# Patient Record
Sex: Female | Born: 1945 | Race: Black or African American | Hispanic: No | Smoking: Former smoker
Health system: Southern US, Community
[De-identification: ages and names within clinical notes are randomized; demographics above are authoritative.]

## PROBLEM LIST (undated history)

## (undated) DIAGNOSIS — R609 Edema, unspecified: Secondary | ICD-10-CM

## (undated) DIAGNOSIS — J45909 Unspecified asthma, uncomplicated: Secondary | ICD-10-CM

## (undated) DIAGNOSIS — I509 Heart failure, unspecified: Secondary | ICD-10-CM

## (undated) DIAGNOSIS — M1990 Unspecified osteoarthritis, unspecified site: Secondary | ICD-10-CM

## (undated) DIAGNOSIS — I272 Pulmonary hypertension, unspecified: Secondary | ICD-10-CM

## (undated) DIAGNOSIS — I959 Hypotension, unspecified: Secondary | ICD-10-CM

## (undated) DIAGNOSIS — Z9289 Personal history of other medical treatment: Secondary | ICD-10-CM

## (undated) DIAGNOSIS — N189 Chronic kidney disease, unspecified: Secondary | ICD-10-CM

## (undated) DIAGNOSIS — G4730 Sleep apnea, unspecified: Secondary | ICD-10-CM

## (undated) DIAGNOSIS — I10 Essential (primary) hypertension: Secondary | ICD-10-CM

## (undated) DIAGNOSIS — I429 Cardiomyopathy, unspecified: Secondary | ICD-10-CM

## (undated) DIAGNOSIS — E785 Hyperlipidemia, unspecified: Secondary | ICD-10-CM

## (undated) DIAGNOSIS — T7840XA Allergy, unspecified, initial encounter: Secondary | ICD-10-CM

## (undated) DIAGNOSIS — I071 Rheumatic tricuspid insufficiency: Secondary | ICD-10-CM

## (undated) DIAGNOSIS — I4891 Unspecified atrial fibrillation: Secondary | ICD-10-CM

## (undated) DIAGNOSIS — J90 Pleural effusion, not elsewhere classified: Secondary | ICD-10-CM

## (undated) HISTORY — PX: COLONOSCOPY W/ BIOPSIES AND POLYPECTOMY: SHX1376

## (undated) HISTORY — DX: Unspecified asthma, uncomplicated: J45.909

## (undated) HISTORY — DX: Hyperlipidemia, unspecified: E78.5

## (undated) HISTORY — PX: ABDOMINAL HYSTERECTOMY: SHX81

## (undated) HISTORY — PX: THORACENTESIS: SHX235

## (undated) HISTORY — PX: MULTIPLE TOOTH EXTRACTIONS: SHX2053

## (undated) HISTORY — PX: CARDIAC CATHETERIZATION: SHX172

---

## 2019-06-09 ENCOUNTER — Encounter (HOSPITAL_COMMUNITY): Payer: Self-pay | Admitting: Emergency Medicine

## 2019-06-09 ENCOUNTER — Ambulatory Visit (HOSPITAL_COMMUNITY)
Admission: EM | Admit: 2019-06-09 | Discharge: 2019-06-09 | Disposition: A | Discharge status: Home / Self Care | Payer: Medicare Other | Attending: Urgent Care | Admitting: Urgent Care

## 2019-06-09 DIAGNOSIS — I482 Chronic atrial fibrillation, unspecified: Secondary | ICD-10-CM | POA: Insufficient documentation

## 2019-06-09 DIAGNOSIS — I429 Cardiomyopathy, unspecified: Secondary | ICD-10-CM | POA: Diagnosis present

## 2019-06-09 DIAGNOSIS — I110 Hypertensive heart disease with heart failure: Secondary | ICD-10-CM

## 2019-06-09 DIAGNOSIS — I959 Hypotension, unspecified: Secondary | ICD-10-CM | POA: Diagnosis not present

## 2019-06-09 DIAGNOSIS — I480 Paroxysmal atrial fibrillation: Secondary | ICD-10-CM

## 2019-06-09 DIAGNOSIS — I10 Essential (primary) hypertension: Secondary | ICD-10-CM | POA: Diagnosis present

## 2019-06-09 DIAGNOSIS — R5383 Other fatigue: Secondary | ICD-10-CM | POA: Insufficient documentation

## 2019-06-09 DIAGNOSIS — I509 Heart failure, unspecified: Secondary | ICD-10-CM | POA: Insufficient documentation

## 2019-06-09 DIAGNOSIS — R001 Bradycardia, unspecified: Secondary | ICD-10-CM

## 2019-06-09 HISTORY — DX: Edema, unspecified: R60.9

## 2019-06-09 HISTORY — DX: Cardiomyopathy, unspecified: I42.9

## 2019-06-09 HISTORY — DX: Pulmonary hypertension, unspecified: I27.20

## 2019-06-09 HISTORY — DX: Sleep apnea, unspecified: G47.30

## 2019-06-09 HISTORY — DX: Rheumatic tricuspid insufficiency: I07.1

## 2019-06-09 HISTORY — DX: Unspecified atrial fibrillation: I48.91

## 2019-06-09 HISTORY — DX: Heart failure, unspecified: I50.9

## 2019-06-09 HISTORY — DX: Essential (primary) hypertension: I10

## 2019-06-09 LAB — POCT URINALYSIS DIP (DEVICE)
Bilirubin Urine: NEGATIVE
Glucose, UA: NEGATIVE mg/dL
Ketones, ur: NEGATIVE mg/dL
Nitrite: NEGATIVE
Protein, ur: NEGATIVE mg/dL
Specific Gravity, Urine: 1.015 (ref 1.005–1.030)
Urobilinogen, UA: 0.2 mg/dL (ref 0.0–1.0)
pH: 6 (ref 5.0–8.0)

## 2019-06-09 NOTE — ED Provider Notes (Signed)
MRN: [id number] DOB: 28-Jul-1946  Subjective:   Melissa Adams is a 73 y.o. female presenting for hypotension, fatigue. Patient was recently seen by her cardiologist, Dr. Thomasene Mohair. Was started with Eliquis on 06/01/2019, was previously on coumadin. She was also decreased from 4mg  to 2mg  of trandalopril. Has taken metoprolol, diltiazem long term without any changes at 100mg  QD and 120mg  QD.  Denies confusion, changes in mental status, severe headache, chest pain, abdominal pain, diaphoresis.  No current facility-administered medications for this encounter.   Current Outpatient Medications:  .  allopurinol (ZYLOPRIM) 300 MG tablet, Take 300 mg by mouth daily., Disp: , Rfl:  .  apixaban (ELIQUIS) 5 MG TABS tablet, Take 5 mg by mouth 2 (two) times daily., Disp: , Rfl:  .  atorvastatin (LIPITOR) 40 MG tablet, Take 40 mg by mouth daily., Disp: , Rfl:  .  colchicine 0.6 MG tablet, Take 0.6 mg by mouth daily., Disp: , Rfl:  .  cyclobenzaprine (FLEXERIL) 10 MG tablet, Take 10 mg by mouth 3 (three) times daily as needed for muscle spasms., Disp: , Rfl:  .  diltiazem (CARDIZEM) 120 MG tablet, Take 120 mg by mouth 4 (four) times daily., Disp: , Rfl:  .  furosemide (LASIX) 80 MG tablet, Take 80 mg by mouth., Disp: , Rfl:  .  gabapentin (NEURONTIN) 300 MG capsule, Take 300 mg by mouth 3 (three) times daily., Disp: , Rfl:  .  metoprolol succinate (TOPROL-XL) 100 MG 24 hr tablet, Take 100 mg by mouth daily. Take with or immediately following a meal., Disp: , Rfl:  .  traMADol (ULTRAM) 50 MG tablet, Take by mouth every 6 (six) hours as needed., Disp: , Rfl:  .  trandolapril (MAVIK) 2 MG tablet, Take 2 mg by mouth daily., Disp: , Rfl:  .  triamcinolone cream (KENALOG) 0.1 %, Apply 1 application topically 2 (two) times daily., Disp: , Rfl:     No Known Allergies   Past Medical History:  Diagnosis Date  . A-fib ([place])   . Cardiomyopathy ([place])   . CHF (congestive heart failure) ([place])   . Edema   .  Hypertension   . Pulmonary hypertension ([place])   . Sleep apnea   . Tricuspid regurgitation      History reviewed. No pertinent surgical history.  ROS  Objective:   Vitals: BP (!) 105/44   Pulse (!) 50   Temp 98.5 F (36.9 C)   Resp 18   SpO2 99%   BP Readings from Last 3 Encounters:  06/09/19 (!) 105/44   Physical Exam Constitutional:      General: She is not in acute distress.    Appearance: Normal appearance. She is well-developed. She is not ill-appearing, toxic-appearing or diaphoretic.  HENT:     Head: Normocephalic and atraumatic.     Nose: Nose normal.     Mouth/Throat:     Mouth: Mucous membranes are moist.     Pharynx: Oropharynx is clear.  Eyes:     General: No scleral icterus.    Extraocular Movements: Extraocular movements intact.     Pupils: Pupils are equal, round, and reactive to light.  Cardiovascular:     Rate and Rhythm: Bradycardia present. Rhythm irregular.     Pulses: Normal pulses.     Heart sounds: Normal heart sounds. No murmur. No friction rub. No gallop.   Pulmonary:     Effort: Pulmonary effort is normal. No respiratory distress.     Breath sounds: Normal breath sounds. No stridor.  No wheezing, rhonchi or rales.  Skin:    General: Skin is warm and dry.     Findings: No rash.  Neurological:     General: No focal deficit present.     Mental Status: She is alert and oriented to person, place, and time.     Cranial Nerves: No cranial nerve deficit.     Motor: No weakness.     Coordination: Coordination normal.  Psychiatric:        Mood and Affect: Mood normal.        Behavior: Behavior normal.        Thought Content: Thought content normal.    Results for orders placed or performed during the hospital encounter of 06/09/19 (from the past 24 hour(s))  POCT urinalysis dip (device)     Status: Abnormal   Collection Time: 06/09/19  5:35 PM  Result Value Ref Range   Glucose, UA NEGATIVE NEGATIVE mg/dL   Bilirubin Urine NEGATIVE NEGATIVE    Ketones, ur NEGATIVE NEGATIVE mg/dL   Specific Gravity, Urine 1.015 1.005 - 1.030   Hgb urine dipstick MODERATE (A) NEGATIVE   pH 6.0 5.0 - 8.0   Protein, ur NEGATIVE NEGATIVE mg/dL   Urobilinogen, UA 0.2 0.0 - 1.0 mg/dL   Nitrite NEGATIVE NEGATIVE   Leukocytes,Ua SMALL (A) NEGATIVE    Assessment and Plan :   1. Hypotension, unspecified hypotension type   2. Essential hypertension   3. Chronic atrial fibrillation   4. Other fatigue   5. Cardiomyopathy, unspecified type ([place])   6. Chronic congestive heart failure, unspecified heart failure type [facility])     Case precepted with Dr. Mannie Stabile.  We decided to have patient hold her metoprolol given her bradycardia which I confirmed through manual check and was counted at 48 bpm.  She has chronic atrial fibrillation noted on exam as well.  I also counseled that she is supposed to be on Lasix 80 mg once a day.  Patient is to follow-up as soon as possible with her cardiologist or return to our clinic if she is unable to see them within the next 2 to 3 days. Counseled patient on potential for adverse effects with medications prescribed/recommended today, ER and return-to-clinic precautions discussed, patient verbalized understanding.    Jaynee Eagles, [place] 06/09/19 1958

## 2019-06-09 NOTE — ED Triage Notes (Signed)
Per family, pt has significant medical history. Pt has a cardiologist etc, pt has had her medicines recently adjusted and was told to monitor BP for the next two weeks. The last few days her BP readings are 92/79, 97/76. Pt also c/o feeling tired.

## 2019-06-09 NOTE — Discharge Instructions (Addendum)
Stop taking Lasix twice daily. Your cardiologist instructions specifies to take Lasix 80mg  just once daily. Hold off on taking metoprolol to see if this helps with the heart rate. If you cannot see your cardiologist in 2-3 days, then come back here for a recheck on your low blood pressure, low heart rate and fatigue.

## 2019-06-10 LAB — URINE CULTURE: Culture: 50000 — AB

## 2019-10-11 ENCOUNTER — Emergency Department (HOSPITAL_COMMUNITY): Payer: Medicare Other

## 2019-10-11 ENCOUNTER — Inpatient Hospital Stay (HOSPITAL_COMMUNITY)
Admission: EM | Admit: 2019-10-11 | Discharge: 2019-11-05 | DRG: 264 | Disposition: A | Discharge status: Home Health | Payer: Medicare Other | Attending: Internal Medicine | Admitting: Internal Medicine

## 2019-10-11 ENCOUNTER — Encounter (HOSPITAL_COMMUNITY): Payer: Self-pay | Admitting: Internal Medicine

## 2019-10-11 ENCOUNTER — Other Ambulatory Visit: Payer: Self-pay

## 2019-10-11 DIAGNOSIS — I081 Rheumatic disorders of both mitral and tricuspid valves: Secondary | ICD-10-CM | POA: Diagnosis present

## 2019-10-11 DIAGNOSIS — E1122 Type 2 diabetes mellitus with diabetic chronic kidney disease: Secondary | ICD-10-CM | POA: Diagnosis present

## 2019-10-11 DIAGNOSIS — R0600 Dyspnea, unspecified: Secondary | ICD-10-CM

## 2019-10-11 DIAGNOSIS — J159 Unspecified bacterial pneumonia: Secondary | ICD-10-CM | POA: Diagnosis not present

## 2019-10-11 DIAGNOSIS — J9601 Acute respiratory failure with hypoxia: Secondary | ICD-10-CM | POA: Diagnosis not present

## 2019-10-11 DIAGNOSIS — D509 Iron deficiency anemia, unspecified: Secondary | ICD-10-CM | POA: Diagnosis present

## 2019-10-11 DIAGNOSIS — I5082 Biventricular heart failure: Secondary | ICD-10-CM | POA: Diagnosis present

## 2019-10-11 DIAGNOSIS — R570 Cardiogenic shock: Secondary | ICD-10-CM | POA: Diagnosis not present

## 2019-10-11 DIAGNOSIS — Z8249 Family history of ischemic heart disease and other diseases of the circulatory system: Secondary | ICD-10-CM

## 2019-10-11 DIAGNOSIS — I5023 Acute on chronic systolic (congestive) heart failure: Secondary | ICD-10-CM

## 2019-10-11 DIAGNOSIS — I272 Pulmonary hypertension, unspecified: Secondary | ICD-10-CM | POA: Diagnosis present

## 2019-10-11 DIAGNOSIS — I5043 Acute on chronic combined systolic (congestive) and diastolic (congestive) heart failure: Secondary | ICD-10-CM | POA: Diagnosis present

## 2019-10-11 DIAGNOSIS — Z79891 Long term (current) use of opiate analgesic: Secondary | ICD-10-CM

## 2019-10-11 DIAGNOSIS — G4733 Obstructive sleep apnea (adult) (pediatric): Secondary | ICD-10-CM | POA: Diagnosis present

## 2019-10-11 DIAGNOSIS — I482 Chronic atrial fibrillation, unspecified: Secondary | ICD-10-CM

## 2019-10-11 DIAGNOSIS — Z79899 Other long term (current) drug therapy: Secondary | ICD-10-CM

## 2019-10-11 DIAGNOSIS — L89892 Pressure ulcer of other site, stage 2: Secondary | ICD-10-CM | POA: Diagnosis not present

## 2019-10-11 DIAGNOSIS — K5900 Constipation, unspecified: Secondary | ICD-10-CM | POA: Diagnosis not present

## 2019-10-11 DIAGNOSIS — Z833 Family history of diabetes mellitus: Secondary | ICD-10-CM

## 2019-10-11 DIAGNOSIS — Z90710 Acquired absence of both cervix and uterus: Secondary | ICD-10-CM

## 2019-10-11 DIAGNOSIS — I472 Ventricular tachycardia: Secondary | ICD-10-CM | POA: Diagnosis not present

## 2019-10-11 DIAGNOSIS — N179 Acute kidney failure, unspecified: Secondary | ICD-10-CM | POA: Diagnosis not present

## 2019-10-11 DIAGNOSIS — I70208 Unspecified atherosclerosis of native arteries of extremities, other extremity: Secondary | ICD-10-CM | POA: Diagnosis present

## 2019-10-11 DIAGNOSIS — I10 Essential (primary) hypertension: Secondary | ICD-10-CM

## 2019-10-11 DIAGNOSIS — I5041 Acute combined systolic (congestive) and diastolic (congestive) heart failure: Secondary | ICD-10-CM | POA: Diagnosis not present

## 2019-10-11 DIAGNOSIS — D631 Anemia in chronic kidney disease: Secondary | ICD-10-CM | POA: Diagnosis present

## 2019-10-11 DIAGNOSIS — E871 Hypo-osmolality and hyponatremia: Secondary | ICD-10-CM | POA: Diagnosis not present

## 2019-10-11 DIAGNOSIS — Z992 Dependence on renal dialysis: Secondary | ICD-10-CM

## 2019-10-11 DIAGNOSIS — R0602 Shortness of breath: Secondary | ICD-10-CM | POA: Diagnosis not present

## 2019-10-11 DIAGNOSIS — D649 Anemia, unspecified: Secondary | ICD-10-CM

## 2019-10-11 DIAGNOSIS — R52 Pain, unspecified: Secondary | ICD-10-CM

## 2019-10-11 DIAGNOSIS — I4819 Other persistent atrial fibrillation: Secondary | ICD-10-CM | POA: Diagnosis present

## 2019-10-11 DIAGNOSIS — Z6841 Body Mass Index (BMI) 40.0 and over, adult: Secondary | ICD-10-CM

## 2019-10-11 DIAGNOSIS — M109 Gout, unspecified: Secondary | ICD-10-CM | POA: Diagnosis present

## 2019-10-11 DIAGNOSIS — J90 Pleural effusion, not elsewhere classified: Secondary | ICD-10-CM

## 2019-10-11 DIAGNOSIS — R042 Hemoptysis: Secondary | ICD-10-CM

## 2019-10-11 DIAGNOSIS — Z820 Family history of epilepsy and other diseases of the nervous system: Secondary | ICD-10-CM

## 2019-10-11 DIAGNOSIS — Z8261 Family history of arthritis: Secondary | ICD-10-CM

## 2019-10-11 DIAGNOSIS — I132 Hypertensive heart and chronic kidney disease with heart failure and with stage 5 chronic kidney disease, or end stage renal disease: Secondary | ICD-10-CM | POA: Diagnosis not present

## 2019-10-11 DIAGNOSIS — I509 Heart failure, unspecified: Secondary | ICD-10-CM

## 2019-10-11 DIAGNOSIS — I5021 Acute systolic (congestive) heart failure: Secondary | ICD-10-CM | POA: Diagnosis not present

## 2019-10-11 DIAGNOSIS — J1289 Other viral pneumonia: Secondary | ICD-10-CM | POA: Diagnosis not present

## 2019-10-11 DIAGNOSIS — Z95828 Presence of other vascular implants and grafts: Secondary | ICD-10-CM

## 2019-10-11 DIAGNOSIS — Z7901 Long term (current) use of anticoagulants: Secondary | ICD-10-CM

## 2019-10-11 DIAGNOSIS — L8990 Pressure ulcer of unspecified site, unspecified stage: Secondary | ICD-10-CM | POA: Insufficient documentation

## 2019-10-11 DIAGNOSIS — E785 Hyperlipidemia, unspecified: Secondary | ICD-10-CM | POA: Diagnosis present

## 2019-10-11 DIAGNOSIS — Z9289 Personal history of other medical treatment: Secondary | ICD-10-CM

## 2019-10-11 DIAGNOSIS — N186 End stage renal disease: Secondary | ICD-10-CM | POA: Diagnosis not present

## 2019-10-11 DIAGNOSIS — Z452 Encounter for adjustment and management of vascular access device: Secondary | ICD-10-CM

## 2019-10-11 DIAGNOSIS — G4730 Sleep apnea, unspecified: Secondary | ICD-10-CM

## 2019-10-11 DIAGNOSIS — R34 Anuria and oliguria: Secondary | ICD-10-CM | POA: Diagnosis not present

## 2019-10-11 DIAGNOSIS — I428 Other cardiomyopathies: Secondary | ICD-10-CM | POA: Diagnosis present

## 2019-10-11 DIAGNOSIS — R509 Fever, unspecified: Secondary | ICD-10-CM

## 2019-10-11 DIAGNOSIS — D6489 Other specified anemias: Secondary | ICD-10-CM | POA: Diagnosis not present

## 2019-10-11 DIAGNOSIS — U071 COVID-19: Secondary | ICD-10-CM | POA: Diagnosis present

## 2019-10-11 LAB — BASIC METABOLIC PANEL
Anion gap: 16 — ABNORMAL HIGH (ref 5–15)
BUN: 47 mg/dL — ABNORMAL HIGH (ref 8–23)
CO2: 24 mmol/L (ref 22–32)
Calcium: 9.5 mg/dL (ref 8.9–10.3)
Chloride: 97 mmol/L — ABNORMAL LOW (ref 98–111)
Creatinine, Ser: 1.91 mg/dL — ABNORMAL HIGH (ref 0.44–1.00)
GFR calc Af Amer: 30 mL/min — ABNORMAL LOW (ref >60)
GFR calc non Af Amer: 26 mL/min — ABNORMAL LOW (ref >60)
Glucose, Bld: 97 mg/dL (ref 70–99)
Potassium: 4.1 mmol/L (ref 3.5–5.1)
Sodium: 137 mmol/L (ref 135–145)

## 2019-10-11 LAB — CBC
HCT: 33.4 % — ABNORMAL LOW (ref 36.0–46.0)
Hemoglobin: 9.7 g/dL — ABNORMAL LOW (ref 12.0–15.0)
MCH: 23.7 pg — ABNORMAL LOW (ref 26.0–34.0)
MCHC: 29 g/dL — ABNORMAL LOW (ref 30.0–36.0)
MCV: 81.5 fL (ref 80.0–100.0)
Platelets: 216 10*3/uL (ref 150–400)
RBC: 4.1 MIL/uL (ref 3.87–5.11)
RDW: 17.2 % — ABNORMAL HIGH (ref 11.5–15.5)
WBC: 6.2 10*3/uL (ref 4.0–10.5)
nRBC: 0.3 % — ABNORMAL HIGH (ref 0.0–0.2)

## 2019-10-11 LAB — BRAIN NATRIURETIC PEPTIDE: B Natriuretic Peptide: 3374.7 pg/mL — ABNORMAL HIGH (ref 0.0–100.0)

## 2019-10-11 LAB — MRSA PCR SCREENING: MRSA by PCR: NEGATIVE

## 2019-10-11 LAB — TROPONIN I (HIGH SENSITIVITY)
Troponin I (High Sensitivity): 30 ng/L — ABNORMAL HIGH (ref <18)
Troponin I (High Sensitivity): 33 ng/L — ABNORMAL HIGH (ref <18)

## 2019-10-11 MED ORDER — SODIUM CHLORIDE 0.9% FLUSH
3.0000 mL | INTRAVENOUS | Status: DC | PRN
  Administered 2019-10-12: 3 mL via INTRAVENOUS
  Filled 2019-10-11: qty 3

## 2019-10-11 MED ORDER — FUROSEMIDE 10 MG/ML IJ SOLN
80.0000 mg | Freq: Once | INTRAMUSCULAR | Status: AC
  Administered 2019-10-11: 80 mg via INTRAVENOUS
  Filled 2019-10-11: qty 8

## 2019-10-11 MED ORDER — ACETAMINOPHEN 325 MG PO TABS
650.0000 mg | ORAL_TABLET | ORAL | Status: DC | PRN
  Administered 2019-10-12 – 2019-10-25 (×3): 650 mg via ORAL
  Filled 2019-10-11 (×4): qty 2

## 2019-10-11 MED ORDER — ALLOPURINOL 300 MG PO TABS
300.0000 mg | ORAL_TABLET | Freq: Every day | ORAL | Status: DC
  Administered 2019-10-11 – 2019-10-26 (×16): 300 mg via ORAL
  Filled 2019-10-11 (×16): qty 1

## 2019-10-11 MED ORDER — SODIUM CHLORIDE 0.9% FLUSH
3.0000 mL | Freq: Two times a day (BID) | INTRAVENOUS | Status: DC
  Administered 2019-10-11 – 2019-10-18 (×7): 3 mL via INTRAVENOUS

## 2019-10-11 MED ORDER — ATORVASTATIN CALCIUM 40 MG PO TABS
40.0000 mg | ORAL_TABLET | Freq: Every day | ORAL | Status: DC
  Administered 2019-10-11 – 2019-11-05 (×25): 40 mg via ORAL
  Filled 2019-10-11 (×26): qty 1

## 2019-10-11 MED ORDER — TRAMADOL HCL 50 MG PO TABS
50.0000 mg | ORAL_TABLET | Freq: Four times a day (QID) | ORAL | Status: DC | PRN
  Administered 2019-10-18 – 2019-11-04 (×3): 50 mg via ORAL
  Filled 2019-10-11 (×4): qty 1

## 2019-10-11 MED ORDER — HEPARIN SODIUM (PORCINE) 5000 UNIT/ML IJ SOLN
5000.0000 [IU] | Freq: Three times a day (TID) | INTRAMUSCULAR | Status: DC
  Administered 2019-10-11 – 2019-10-12 (×2): 5000 [IU] via SUBCUTANEOUS
  Filled 2019-10-11 (×2): qty 1

## 2019-10-11 MED ORDER — CYCLOBENZAPRINE HCL 10 MG PO TABS: 10.0000 mg | ORAL_TABLET | Freq: Three times a day (TID) | ORAL | Status: DC | PRN

## 2019-10-11 MED ORDER — SODIUM CHLORIDE 0.9 % IV SOLN: 250.0000 mL | INTRAVENOUS | Status: DC | PRN

## 2019-10-11 MED ORDER — ONDANSETRON HCL 4 MG/2ML IJ SOLN
4.0000 mg | Freq: Four times a day (QID) | INTRAMUSCULAR | Status: DC | PRN
  Administered 2019-10-13: 4 mg via INTRAVENOUS
  Filled 2019-10-11: qty 2

## 2019-10-11 NOTE — Consult Note (Signed)
Cardiology Consultation:   Patient ID: Melissa Adams MRN: [id number]; DOB: 31-May-1946  Admit date: 10/11/2019 Date of Consult: 10/11/2019  Primary Care Provider: System, Provider Not In Primary Cardiologist: [place] , [place]   , Melissa Adams at [facility] Primary Electrophysiologist:  None    Patient Profile:   Melissa Adams is a 73 y.o. female with a hx of atrial fibrillation, cardiomyopathy, congestive heart failure, hypertension who is being seen today for the evaluation of congestive heart failure exacerbation at the request of  Dr. Ree Kida .  History of Present Illness:   Ms. Melissa Adams is a 73 year old female with a history of atrial fibrillation, chronic congestive heart failure, hypertension who was down in [place] visiting her daughter.  For the past several weeks the patient has had weight gain of approximately 22 pounds.  She has had some worsening shortness of breath with exertion.  She is had markedly increasing dyspnea with exertion.  She has had some fluttering sensation in her chest which she attributes to her atrial fibrillation.  She is typically on Eliquis for her paroxysmal atrial fibrillation but she stopped it several days ago because she is scheduled for mitral valve and tricuspid valve surgery on October 13, 2019. Patient called her normal cardiologist and he increase the Lasix to 80 mg twice a day.  She is been taking the Lasix twice a day for the past 3 days but has not had any improvement of her dyspnea.   Denies any  Extra salt intake. Did eat soup regularly several weeks ago when she was at home  No angina   Heart Pathway Score:     Past Medical History:  Diagnosis Date  . A-fib ([place])   . Cardiomyopathy ([place])   . CHF (congestive heart failure) ([place])   . Edema   . Hypertension   . Pulmonary hypertension ([place])   . Sleep apnea   . Tricuspid regurgitation     No past surgical history on file.   Home Medications:  Prior to Admission medications    Medication Sig Start Date End Date Taking? Authorizing Provider  allopurinol (ZYLOPRIM) 300 MG tablet Take 300 mg by mouth daily.    [provider]  apixaban (ELIQUIS) 5 MG TABS tablet Take 5 mg by mouth 2 (two) times daily.    [provider]  atorvastatin (LIPITOR) 40 MG tablet Take 40 mg by mouth daily.    [provider]  colchicine 0.6 MG tablet Take 0.6 mg by mouth daily.    [provider]  cyclobenzaprine (FLEXERIL) 10 MG tablet Take 10 mg by mouth 3 (three) times daily as needed for muscle spasms.    [provider]  diltiazem (CARDIZEM) 120 MG tablet Take 120 mg by mouth 4 (four) times daily.    [provider]  furosemide (LASIX) 80 MG tablet Take 80 mg by mouth.    [provider]  gabapentin (NEURONTIN) 300 MG capsule Take 300 mg by mouth 3 (three) times daily.    [provider]  metoprolol succinate (TOPROL-XL) 100 MG 24 hr tablet Take 100 mg by mouth daily. Take with or immediately following a meal.    [provider]  traMADol (ULTRAM) 50 MG tablet Take by mouth every 6 (six) hours as needed.    [provider]  trandolapril (MAVIK) 2 MG tablet Take 2 mg by mouth daily.    [provider]  triamcinolone cream (KENALOG) 0.1 % Apply 1 application topically 2 (two) times daily.  [provider]    Inpatient Medications: Scheduled Meds: . furosemide  80 mg Intravenous Once   Continuous Infusions:  PRN Meds:   Allergies:   No Known Allergies  Social History:   Social History   Socioeconomic History  . Marital status: Widowed    Spouse name: Not on file  . Number of children: Not on file  . Years of education: Not on file  . Highest education level: Not on file  Occupational History  . Not on file  Social Needs  . Financial resource strain: Not on file  . Food insecurity    Worry: Not on file    Inability: Not on file  . Transportation needs    Medical:  Not on file    Non-medical: Not on file  Tobacco Use  . Smoking status: Never Smoker  Substance and Sexual Activity  . Alcohol use: Not Currently  . Drug use: Not on file  . Sexual activity: Not Currently  Lifestyle  . Physical activity    Days per week: Not on file    Minutes per session: Not on file  . Stress: Not on file  Relationships  . Social Herbalist on phone: Not on file    Gets together: Not on file    Attends religious service: Not on file    Active member of club or organization: Not on file    Attends meetings of clubs or organizations: Not on file    Relationship status: Not on file  . Intimate partner violence    Fear of current or ex partner: Not on file    Emotionally abused: Not on file    Physically abused: Not on file    Forced sexual activity: Not on file  Other Topics Concern  . Not on file  Social History Narrative  . Not on file    Family History:    Family History  Family history unknown: Yes     ROS:  Please see the history of present illness.  + cough No fever No bleeding  No angina  No sneezing , No GI complaints   All other ROS reviewed and negative.     Physical Exam/Data:   Vitals:   10/11/19 1227 10/11/19 1457  BP: 113/75 (!) 116/58  Pulse: (!) 57 (!) 51  Resp: 20 16  Temp: 98.1 F (36.7 C) 98.6 F (37 C)  TempSrc: Oral Oral  SpO2: 99% 94%   No intake or output data in the 24 hours ending 10/11/19 1657 No flowsheet data found.   There is no height or weight on file to calculate BMI.  General:   Elderly  , obese female,  NAD  HEENT: normal Lymph: no adenopathy Neck: ++ JVD  Endocrine:  No thryomegaly Vascular: No carotid bruits; FA pulses 2+ bilaterally without bruits  Cardiac: RR  normal S1, S2;   Soft systolic murmur  Lungs:  clear to auscultation bilaterally, no wheezing, rhonchi or rales  Abd: soft, nontender, no hepatomegaly  Ext: 1-2 + leg edema   Musculoskeletal:  No deformities, BUE and BLE  strength normal and equal Skin: warm and dry  Neuro:  CNs 2-12 intact, no focal abnormalities noted Psych:  Normal affect   EKG:  The EKG was personally reviewed and demonstrates:  ? Junctional rhythm  with frequent PVCs in a bigeminy patter,   Telemetry:  Telemetry was personally reviewed and demonstrates:  - pt was off tele during the exam  Relevant CV Studies:   Laboratory Data:  High Sensitivity Troponin:   Recent Labs  Lab 10/11/19 1309  TROPONINIHS 30*     Chemistry Recent Labs  Lab 10/11/19 1309  NA 137  K 4.1  CL 97*  CO2 24  GLUCOSE 97  BUN 47*  CREATININE 1.91*  CALCIUM 9.5  GFRNONAA 26*  GFRAA 30*  ANIONGAP 16*    No results for input(s): PROT, ALBUMIN, AST, ALT, ALKPHOS, BILITOT in the last 168 hours. Hematology Recent Labs  Lab 10/11/19 1309  WBC 6.2  RBC 4.10  HGB 9.7*  HCT 33.4*  MCV 81.5  MCH 23.7*  MCHC 29.0*  RDW 17.2*  PLT 216   BNP Recent Labs  Lab 10/11/19 1309  BNP 3,374.7*    DDimer No results for input(s): DDIMER in the last 168 hours.   Radiology/Studies:  Dg Chest 2 View  Result Date: 10/11/2019 CLINICAL DATA:  Shortness of breath EXAM: CHEST - 2 VIEW COMPARISON:  None FINDINGS: There is a small right pleural effusion. There is mild bibasilar atelectasis. There is no edema or consolidation. There is cardiomegaly with pulmonary vascularity within normal limits. No adenopathy. There is mild anterior wedging of an upper thoracic vertebral body. IMPRESSION: Small right pleural effusion with bibasilar atelectasis. No edema or consolidation. There is cardiomegaly.  No evident adenopathy. Electronically Signed   By: Lowella Grip III M.D.   On: 10/11/2019 12:59    Assessment and Plan:   1. Acute on chronic combined systolic and diastolic congestive heart failure: The patient has a history of congestive heart failure.  She also has severe mitral regurgitation and tricuspid regurgitation.  She presents with a 20+ pound  weight gain over the past several weeks.  It did not respond to double dose Lasix.  She is scheduled to have mitral valve surgery and tricuspid valve surgery in 2 days in [place].  She is down here visiting her daughter and is now hospitalized here at [facility].  Her B natruretic peptide is 3374.  Creatinine is 1.9.   She has elevated JVD and leg edema.   I suspect that she is on the far side of the [place] curve and I think that her creatinine will actually improved with diuresis.  Agree with giving her IV Lasix for now.  She received some the emergency room and will receive another dose in the morning.  We will continue to monitor following that.  2.  MR:   Her systolic murmur is very soft - this may be due to severe MR . Echo tomorrow   3.  TR :   Echo tomorrow       For questions or updates, please contact [place] Please consult [email or web address] for contact info under     Signed, Mertie Moores, MD  10/11/2019 4:57 PM

## 2019-10-11 NOTE — ED Provider Notes (Signed)
[place] [facility] EMERGENCY DEPARTMENT Provider Note   CSN: [id number] Arrival date & time: 10/11/19  1216     History   Chief Complaint Chief Complaint  Patient presents with   Leg Swelling   Shortness of Breath    HPI Melissa Adams is a 73 y.o. female present history of A. fib, cardiomyopathy, CHF, hypertension who presents for evaluation of increased weight gain, dyspnea on exertion, chest fluttering, bilateral lower extremity edema.  Patient had been down in [place] visiting her daughter for the last several weeks.  Daughter noted that since 09/20/2019, patient has gained approximately 22 pounds.  Additionally, patient was having some worsening dyspnea on exertion.  When she sits still, she feels some mild difficulty breathing but states that even when she gets up to walk a few feet, she has to stop because she is winded and tired.  She states that even when she walks a few feet, she gets very short of breath which is abnormal for her.  Additionally, she states she has not been able to sleep flat over the last several weeks secondary to symptoms.  She has had a cough but states it is nonproductive.  She states she has had some increased fluttering in her chest which she attributes to her A. fib.  She states she is not in A. fib all the time but can usually tell when she goes in and out of it.  She is on Eliquis though she stopped it yesterday because she is due for mitral valve and tricuspid valve replacement surgery on 10/13/2019.  She had previously also been on diltiazem but was told to stop that.  Daughter called her cardiologist regarding her symptoms and she was instructed to increase her Lasix Lasix from 80 mg daily to 80 mg twice daily which she has been doing for 3 days with no improvement in symptoms.  Patient denies any fevers, chest pain, abdominal pain, nausea/vomiting, urinary complaints.  Cardiology: Ervin Knack      The history is provided by the  patient.    Past Medical History:  Diagnosis Date   A-fib ([place])    Cardiomyopathy ([place])    CHF (congestive heart failure) (HCC)    Edema    Hypertension    Pulmonary hypertension (H[facility])    Sleep apnea    Tricuspid regurgitation     Patient Active Problem List   Diagnosis Date Noted   CHF exacerbation ([place]) 10/11/2019    No past surgical history on file.   OB History   No obstetric history on file.      Home Medications    Prior to Admission medications   Medication Sig Start Date End Date Taking? Authorizing Provider  allopurinol (ZYLOPRIM) 300 MG tablet Take 300 mg by mouth daily.    [provider]  apixaban (ELIQUIS) 5 MG TABS tablet Take 5 mg by mouth 2 (two) times daily.    [provider]  atorvastatin (LIPITOR) 40 MG tablet Take 40 mg by mouth daily.    [provider]  colchicine 0.6 MG tablet Take 0.6 mg by mouth daily.    [provider]  cyclobenzaprine (FLEXERIL) 10 MG tablet Take 10 mg by mouth 3 (three) times daily as needed for muscle spasms.    [provider]  diltiazem (CARDIZEM) 120 MG tablet Take 120 mg by mouth 4 (four) times daily.    [provider]  furosemide (LASIX) 80 MG tablet Take 80 mg by mouth.  [provider]  gabapentin (NEURONTIN) 300 MG capsule Take 300 mg by mouth 3 (three) times daily.    [provider]  metoprolol succinate (TOPROL-XL) 100 MG 24 hr tablet Take 100 mg by mouth daily. Take with or immediately following a meal.    [provider]  traMADol (ULTRAM) 50 MG tablet Take by mouth every 6 (six) hours as needed.    [provider]  trandolapril (MAVIK) 2 MG tablet Take 2 mg by mouth daily.    [provider]  triamcinolone cream (KENALOG) 0.1 % Apply 1 application topically 2 (two) times daily.    [provider]    Family History Family History  Family history unknown: Yes    Social History Social  History   Tobacco Use   Smoking status: Never Smoker  Substance Use Topics   Alcohol use: Not Currently   Drug use: Not on file     Allergies   Patient has no known allergies.   Review of Systems Review of Systems  Constitutional: Positive for fatigue. Negative for fever.  Respiratory: Positive for shortness of breath. Negative for cough.   Cardiovascular: Positive for leg swelling. Negative for chest pain.  Gastrointestinal: Negative for abdominal pain, nausea and vomiting.  Genitourinary: Negative for dysuria and hematuria.  Neurological: Negative for headaches.  All other systems reviewed and are negative.    Physical Exam Updated Vital Signs BP (!) 116/58 (BP Location: Right Arm)    Pulse (!) 51    Temp 98.6 F (37 C) (Oral)    Resp 16    SpO2 94%   Physical Exam Vitals signs and nursing note reviewed.  Constitutional:      Appearance: Normal appearance. She is well-developed.  HENT:     Head: Normocephalic and atraumatic.  Eyes:     General: Lids are normal.     Conjunctiva/sclera: Conjunctivae normal.     Pupils: Pupils are equal, round, and reactive to light.  Neck:     Musculoskeletal: Full passive range of motion without pain.  Cardiovascular:     Rate and Rhythm: Normal rate and regular rhythm.     Pulses: Normal pulses.          Radial pulses are 2+ on the right side and 2+ on the left side.       Dorsalis pedis pulses are 2+ on the right side and 2+ on the left side.     Heart sounds: Normal heart sounds. No murmur. No friction rub. No gallop.   Pulmonary:     Effort: Pulmonary effort is normal. Tachypnea present.     Breath sounds: Rales present.     Comments: Rales noted to bilateral bases.  Tachypnea noted. Abdominal:     Palpations: Abdomen is soft. Abdomen is not rigid.     Tenderness: There is no abdominal tenderness. There is no guarding.  Musculoskeletal: Normal range of motion.     Comments: 2+ pitting edema noted to the distal tib-fib  that extends to the ankles.  No overlying warmth, erythema.  Skin:    General: Skin is warm and dry.     Capillary Refill: Capillary refill takes less than 2 seconds.  Neurological:     Mental Status: She is alert and oriented to person, place, and time.  Psychiatric:        Speech: Speech normal.      ED Treatments / Results  Labs (all labs ordered are listed, but only abnormal results  are displayed) Labs Reviewed  BASIC METABOLIC PANEL - Abnormal; Notable for the following components:      Result Value   Chloride 97 (*)    BUN 47 (*)    Creatinine, Ser 1.91 (*)    GFR calc non Af Amer 26 (*)    GFR calc Af Amer 30 (*)    Anion gap 16 (*)    All other components within normal limits  CBC - Abnormal; Notable for the following components:   Hemoglobin 9.7 (*)    HCT 33.4 (*)    MCH 23.7 (*)    MCHC 29.0 (*)    RDW 17.2 (*)    nRBC 0.3 (*)    All other components within normal limits  BRAIN NATRIURETIC PEPTIDE - Abnormal; Notable for the following components:   B Natriuretic Peptide 3,374.7 (*)    All other components within normal limits  TROPONIN I (HIGH SENSITIVITY) - Abnormal; Notable for the following components:   Troponin I (High Sensitivity) 30 (*)    All other components within normal limits  SARS CORONAVIRUS 2 (TAT 6-24 HRS)  TROPONIN I (HIGH SENSITIVITY)    EKG EKG Interpretation  Date/Time:  Monday October 11 2019 12:43:26 EST Ventricular Rate:  107 PR Interval:    QRS Duration: 90 QT Interval:  348 QTC Calculation: 464 R Axis:   61 Text Interpretation: Accelerated Junctional rhythm with frequent and consecutive Premature ventricular complexes and Fusion complexes in a pattern of bigeminy Low voltage QRS Cannot rule out Anterior infarct , age undetermined Abnormal ECG Confirmed by Fredia Sorrow [phone]) on 10/11/2019 1:03:13 PM   Radiology Dg Chest 2 View  Result Date: 10/11/2019 CLINICAL DATA:  Shortness of breath EXAM: CHEST - 2 VIEW  COMPARISON:  None FINDINGS: There is a small right pleural effusion. There is mild bibasilar atelectasis. There is no edema or consolidation. There is cardiomegaly with pulmonary vascularity within normal limits. No adenopathy. There is mild anterior wedging of an upper thoracic vertebral body. IMPRESSION: Small right pleural effusion with bibasilar atelectasis. No edema or consolidation. There is cardiomegaly.  No evident adenopathy. Electronically Signed   By: Lowella Grip III M.D.   On: 10/11/2019 12:59    Procedures Procedures (including critical care time)  Medications Ordered in ED Medications  furosemide (LASIX) injection 80 mg (has no administration in time range)     Initial Impression / Assessment and Plan / ED Course  I have reviewed the triage vital signs and the nursing notes.  Pertinent labs & imaging results that were available during my care of the patient were reviewed by me and considered in my medical decision making (see chart for details).        73 year old female past medical history of CHF, A. Fib who presents for evaluation of 2 weeks of progressively worsening bilateral lower extremity edema, weight gain, shortness of breath, fatigue.  She currently gets her care in [place] and is here visiting her daughter.  Daughter noticed changes in called her cardiologist who recommended that she double her daily Lasix dose which she had been doing for the last couple days with no improvement.  Patient is scheduled to have mitral valve and tricuspid valve replacement on 10/13/2019.  She has a history of A. fib and is on Eliquis but held her dose since last night in preparation for surgery.  She had previously been on diltiazem but was taken off.  Patient denies any chest pain but states she has fluttering.  On initial arrival, she is afebrile but appears uncomfortable and is tachypneic.  She is very dyspneic even on transfer from chair to bed.  If she starts talking for a  long time, she also has some trouble breathing and daughter provides much of the history.  Concern for acute CHF exacerbation versus infectious etiology plan for labs.  Troponin is elevated at 30, BNP is elevated at 3374.7.  CBC shows no leukocytosis.  Hemoglobin is 9.7.  BMP shows BUN of 47, creatinine of 1.91.  He shows bibasilar atelectasis with a small right-sided pleural effusion.  I suspect her troponin is elevated from demand ischemia.  We will start her on IV Lasix but I feel like given that she is fairly symptomatic, will need to be admitted for IV diuresis. Discussed patient with Dr. Regenia Skeeter who is agreeable to plan.  We will work on obtaining her medical records from her cardiologist.  Discussed patient with Dr. Ree Kida (hospitalist) who will admit.   Portions of this note were generated with [employer]. Dictation errors may occur despite best attempts at proofreading.   Final Clinical Impressions(s) / ED Diagnoses   Final diagnoses:  Acute on chronic congestive heart failure, unspecified heart failure type [facility])  Dyspnea, unspecified type    ED Discharge Orders    None       Volanda Napoleon, PA-C 10/11/19 1707    Sherwood Gambler, MD 10/12/19 [phone]

## 2019-10-11 NOTE — H&P (Signed)
[facility] History and Physical  [name] KZS:[id number] DOB: 09-Sep-1946 DOA: 10/11/2019  PCP: System, Provider Not In  Patient coming from: home   Chief Complaint: Shortness of breath, weight gain  HPI: Melissa Adams is a 73 y.o. female with a medical history of heart failure, atrial fibrillation, hypertension, sleep apnea, who presented to the emergency department with complaints of shortness of breath and weight gain.  At states that over the last 2 to 3 weeks, her shortness of breath has been increasing.  She was told to double her Lasix dose on 09/20/2019 160 mg daily.  She did this for a couple of days however she then had blood work drawn and was told to stop taking the Lasix as her creatinine was elevated.  Of note her cardiology office did offer me laboratory studies from 10/08/2019: BNP 3435, creatinine 1.8.  Patient states she has been having shortness of breath especially with walking and speaking for a long time.  Unable to walk to the bathroom without having to stop.  She states she is also gained 22 pounds and that her leg edema has worsened.  Patient was due for a mitral valve and tricuspid valve replacement 10/13/2019 in [place].  Currently she states she had palpitations this morning however has been off of her Cardizem as well as Eliquis.  Denies any recent travel, sick contacts.  She denies chest pain, abdominal pain, nausea or vomiting, diarrhea or constipation, dysuria, dizziness or headache.  ED Course: Patient found to have an elevated BNP with dyspnea on exertion.  She was given IV Lasix 80 mg x 1 dose.  TRH called for admission.  Review of Systems:  All other systems reviewed and are negative.   Past Medical History:  Diagnosis Date  . A-fib ([place])   . Cardiomyopathy ([place])   . CHF (congestive heart failure) ([place])   . Edema   . Hypertension   . Pulmonary hypertension ([place])   . Sleep apnea   . Tricuspid regurgitation     Surgical  history Hysterectomy   Social History:  reports that she has never smoked. She does not have any smokeless tobacco history on file. She reports previous alcohol use. No history on file for drug.  No Known Allergies  Family History  Problem Relation Age of Onset  . Seizures Father   . CAD Father   . Diabetes Sister   . Lupus Sister   . Hypertension Sister   States that parents did not have any active issues.  Prior to Admission medications   Medication Sig Start Date End Date Taking? Authorizing Provider  allopurinol (ZYLOPRIM) 300 MG tablet Take 300 mg by mouth daily.    [provider]  apixaban (ELIQUIS) 5 MG TABS tablet Take 5 mg by mouth 2 (two) times daily.    [provider]  atorvastatin (LIPITOR) 40 MG tablet Take 40 mg by mouth daily.    [provider]  colchicine 0.6 MG tablet Take 0.6 mg by mouth daily.    [provider]  cyclobenzaprine (FLEXERIL) 10 MG tablet Take 10 mg by mouth 3 (three) times daily as needed for muscle spasms.    [provider]  diltiazem (CARDIZEM) 120 MG tablet Take 120 mg by mouth 4 (four) times daily.    [provider]  furosemide (LASIX) 80 MG tablet Take 80 mg by mouth.    [provider]  gabapentin (NEURONTIN) 300 MG capsule Take 300 mg by mouth 3 (three)  times daily.    [provider]  metoprolol succinate (TOPROL-XL) 100 MG 24 hr tablet Take 100 mg by mouth daily. Take with or immediately following a meal.    [provider]  traMADol (ULTRAM) 50 MG tablet Take by mouth every 6 (six) hours as needed.    [provider]  trandolapril (MAVIK) 2 MG tablet Take 2 mg by mouth daily.    [provider]  triamcinolone cream (KENALOG) 0.1 % Apply 1 application topically 2 (two) times daily.    [provider]    Physical Exam: Vitals:   10/11/19 1227 10/11/19 1457  BP: 113/75 (!) 116/58  Pulse: (!) 57 (!) 51  Resp: 20 16  Temp: 98.1  F (36.7 C) 98.6 F (37 C)  SpO2: 99% 94%     General: Well developed, well nourished, NAD, appears stated age  HEENT: NCAT, PERRLA, EOMI, Anicteic Sclera, mucous membranes moist.   Neck: Supple, no masses, cannot assess JVD given body habitus  Cardiovascular: S1 S2 auscultated, heart sounds are distant, irregular, SEM   Respiratory: Tachypneic, bilateral basal Rales, mild expiratory wheezing.  Patient taking multiple pauses when speaking  Abdomen: Soft, nontender, nondistended, + bowel sounds  Extremities: warm dry without cyanosis clubbing. 2+ LE edema B/L   Neuro: AAOx3, cranial nerves grossly intact. Strength 5/5 in patient's upper and lower extremities bilaterally  Skin: Without rashes exudates or nodules  Psych: Normal affect and demeanor with intact judgement and insight  Labs on Admission: I have personally reviewed following labs and imaging studies CBC: Recent Labs  Lab 10/11/19 1309  WBC 6.2  HGB 9.7*  HCT 33.4*  MCV 81.5  PLT 650   Basic Metabolic Panel: Recent Labs  Lab 10/11/19 1309  NA 137  K 4.1  CL 97*  CO2 24  GLUCOSE 97  BUN 47*  CREATININE 1.91*  CALCIUM 9.5   GFR: CrCl cannot be calculated (Unknown ideal weight.). Liver Function Tests: No results for input(s): AST, ALT, ALKPHOS, BILITOT, PROT, ALBUMIN in the last 168 hours. No results for input(s): LIPASE, AMYLASE in the last 168 hours. No results for input(s): AMMONIA in the last 168 hours. Coagulation Profile: No results for input(s): INR, PROTIME in the last 168 hours. Cardiac Enzymes: No results for input(s): CKTOTAL, CKMB, CKMBINDEX, TROPONINI in the last 168 hours. BNP (last 3 results) No results for input(s): PROBNP in the last 8760 hours. HbA1C: No results for input(s): HGBA1C in the last 72 hours. CBG: No results for input(s): GLUCAP in the last 168 hours. Lipid Profile: No results for input(s): CHOL, HDL, LDLCALC, TRIG, CHOLHDL, LDLDIRECT in the last 72 hours. Thyroid  Function Tests: No results for input(s): TSH, T4TOTAL, FREET4, T3FREE, THYROIDAB in the last 72 hours. Anemia Panel: No results for input(s): VITAMINB12, FOLATE, FERRITIN, TIBC, IRON, RETICCTPCT in the last 72 hours. Urine analysis:    Component Value Date/Time   LABSPEC 1.015 06/09/2019 1735   PHURINE 6.0 06/09/2019 1735   GLUCOSEU NEGATIVE 06/09/2019 1735   HGBUR MODERATE (A) 06/09/2019 1735   BILIRUBINUR NEGATIVE 06/09/2019 1735   KETONESUR NEGATIVE 06/09/2019 1735   PROTEINUR NEGATIVE 06/09/2019 1735   UROBILINOGEN 0.2 06/09/2019 1735   NITRITE NEGATIVE 06/09/2019 1735   LEUKOCYTESUR SMALL (A) 06/09/2019 1735   Sepsis Labs: @LABRCNTIP (procalcitonin:4,lacticidven:4) )No results found for this or any previous visit (from the past 240 hour(s)).   Radiological Exams on Admission: Dg Chest 2 View  Result Date: 10/11/2019 CLINICAL DATA:  Shortness of breath EXAM: CHEST -  2 VIEW COMPARISON:  None FINDINGS: There is a small right pleural effusion. There is mild bibasilar atelectasis. There is no edema or consolidation. There is cardiomegaly with pulmonary vascularity within normal limits. No adenopathy. There is mild anterior wedging of an upper thoracic vertebral body. IMPRESSION: Small right pleural effusion with bibasilar atelectasis. No edema or consolidation. There is cardiomegaly.  No evident adenopathy. Electronically Signed   By: Lowella Grip III M.D.   On: 10/11/2019 12:59    EKG: Independently reviewed.  Junctional rhythm, rate 107, PVC  Assessment/Plan  Acute systolic CHF exacerbation -Presented with dyspnea on exertion, 22 pound weight gain since September 20, 2019, lower extremity edema -Dry weight is approximately 226 pounds -BNP 3374.7 -CXR showed small right pleural effusion with bibasilar atelectasis. -Patient does appear to be hypervolemic on examination.  She does have noticeable shortness of breath and has to take pauses when speaking. -Echocardiogram on  August 05, 2019 noted an EF of 40 to 45%(obtain this by calling patient's cardiologist, Dr. Ervin Knack) -Of note, patient was told to increase her Lasix, 80 mg daily to twice daily on 09/20/2019.  She was then told to stop this after labs are received on 10/08/2019 as she was noted to have an elevated BUN and creatinine. -She has already received IV Lasix 80 mg -Cardiology consulted and appreciated -Monitor intake and output, daily weights  Acute kidney injury -Creatinine on admission 1.91 -Suspect secondary to Lasix -Patient's creatinine August 05, 2019 was 1.2 -Will continue to monitor BMP closely  Normocytic anemia -Hemoglobin currently 9.7, unknown baseline -Continue to monitor CBC  Atrial fibrillation -Patient currently in atrial fibrillation -Eliquis was held as she was supposed to have surgery on 10/13/2019 for mitral and tricuspid valve replacement -Will hold Cardizem and metoprolol at this time as patient's heart rate is bradycardic in the 31s  Essential hypertension -Metoprolol, Lasix held -Mavik was noted on her medication list however per daughter she has not been taking this  Sleep apnea -Patient states that she does use a CPAP however has not used it recently as it is broken and is being replaced currently -Ordered CPAP with auto titration  Tricuspid regurgitation -As above, patient is scheduled to have a mitral and tricuspid valve replacement on 10/13/2019 in [place]  Gout -Continue allopurinol -Colchicine held  DVT prophylaxis: Heparin  Code Status: Full  Family Communication: Daughter at bedside.  Admission, patients condition and plan of care including tests being ordered have been discussed with the patient and daughter who indicate understanding and agree with the plan and Code Status.  Disposition Plan: Home when stable  Consults called: Audiology  Admission status: Observation.  Time spent: 70 minutes  Snyder Colavito D.O.  [facility]  Between 7am to 7pm - Please see pager noted on [email or web address]  After 7pm go to [email or web address] 10/11/2019, 5:28 PM

## 2019-10-11 NOTE — ED Triage Notes (Signed)
Pt endorses fluid build up in both legs with shob x 2 weeks. Supposed to have open heart surgery this Wednesday but unable to until fluid comes off. Axox4. VSS.

## 2019-10-11 NOTE — ED Notes (Signed)
ED TO INPATIENT HANDOFF REPORT  ED Nurse Name and Phone #: Lunette Stands [place] Name/Age/Gender Melissa Adams 73 y.o. female Room/Bed: 038C/038C  Code Status   Code Status: Full Code  Home/SNF/Other Home Patient oriented to: self, place, time and situation Is this baseline? Yes   Triage Complete: Triage complete  Chief Complaint FLUID OVERLOAD  Triage Note Pt endorses fluid build up in both legs with shob x 2 weeks. Supposed to have open heart surgery this Wednesday but unable to until fluid comes off. Axox4. VSS.    Allergies No Known Allergies  Level of Care/Admitting Diagnosis ED Disposition    ED Disposition Condition [facility] Area: [place] [100100]  Level of Care: Telemetry Cardiac [103]  I expect the patient will be discharged within 24 hours: No (not a candidate for 5C-Observation unit)  Covid Evaluation: Asymptomatic Screening Protocol (No Symptoms)  Diagnosis: CHF exacerbation [facility]) [[id number]]  Admitting Physician: Cristal Ford [phone]  Attending Physician: Cristal Ford [phone]  PT Class (Do Not Modify): Observation [104]  PT Acc Code (Do Not Modify): Observation [10022]       B Medical/Surgery History Past Medical History:  Diagnosis Date  . A-fib ([place])   . Cardiomyopathy ([place])   . CHF (congestive heart failure) ([place])   . Edema   . Hypertension   . Pulmonary hypertension ([place])   . Sleep apnea   . Tricuspid regurgitation    No past surgical history on file.   A IV Location/Drains/Wounds Patient Lines/Drains/Airways Status   Active Line/Drains/Airways    Name:   Placement date:   Placement time:   Site:   Days:   Peripheral IV 10/11/19 Left Hand   10/11/19    1640    Hand   less than 1          Intake/Output Last 24 hours No intake or output data in the 24 hours ending 10/11/19 2007  Labs/Imaging Results for orders placed or performed during the hospital encounter of 10/11/19 (from  the past 48 hour(s))  Basic metabolic panel     Status: Abnormal   Collection Time: 10/11/19  1:09 PM  Result Value Ref Range   Sodium 137 135 - 145 mmol/L   Potassium 4.1 3.5 - 5.1 mmol/L   Chloride 97 (L) 98 - 111 mmol/L   CO2 24 22 - 32 mmol/L   Glucose, Bld 97 70 - 99 mg/dL   BUN 47 (H) 8 - 23 mg/dL   Creatinine, Ser 1.91 (H) 0.44 - 1.00 mg/dL   Calcium 9.5 8.9 - 10.3 mg/dL   GFR calc non Af Amer 26 (L) >60 mL/min   GFR calc Af Amer 30 (L) >60 mL/min   Anion gap 16 (H) 5 - 15    Comment: Performed at [facility], [address] [name], [place] [address]  CBC     Status: Abnormal   Collection Time: 10/11/19  1:09 PM  Result Value Ref Range   WBC 6.2 4.0 - 10.5 K/uL   RBC 4.10 3.87 - 5.11 MIL/uL   Hemoglobin 9.7 (L) 12.0 - 15.0 g/dL   HCT 33.4 (L) 36.0 - 46.0 %   MCV 81.5 80.0 - 100.0 fL   MCH 23.7 (L) 26.0 - 34.0 pg   MCHC 29.0 (L) 30.0 - 36.0 g/dL   RDW 17.2 (H) 11.5 - 15.5 %   Platelets 216 150 - 400 K/uL   nRBC 0.3 (H) 0.0 - 0.2 %  Comment: Performed at [facility], [place] [address], [place], [place] [address]  Troponin I (High Sensitivity)     Status: Abnormal   Collection Time: 10/11/19  1:09 PM  Result Value Ref Range   Troponin I (High Sensitivity) 30 (H) <18 ng/L    Comment: (NOTE) Elevated high sensitivity troponin I (hsTnI) values and significant  changes across serial measurements may suggest ACS but many other  chronic and acute conditions are known to elevate hsTnI results.  Refer to the "Links" section for chest pain algorithms and additional  guidance. Performed at [facility], [place] [address], [place], [place] [address]   Brain natriuretic peptide     Status: Abnormal   Collection Time: 10/11/19  1:09 PM  Result Value Ref Range   B Natriuretic Peptide 3,374.7 (H) 0.0 - 100.0 pg/mL    Comment: Performed at [facility] [address], [place], [place] [address]  Troponin I (High Sensitivity)     Status: Abnormal    Collection Time: 10/11/19  4:16 PM  Result Value Ref Range   Troponin I (High Sensitivity) 33 (H) <18 ng/L    Comment: (NOTE) Elevated high sensitivity troponin I (hsTnI) values and significant  changes across serial measurements may suggest ACS but many other  chronic and acute conditions are known to elevate hsTnI results.  Refer to the "Links" section for chest pain algorithms and additional  guidance. Performed at [facility], [place] [address], [place], [place] [address]    Dg Chest 2 View  Result Date: 10/11/2019 CLINICAL DATA:  Shortness of breath EXAM: CHEST - 2 VIEW COMPARISON:  None FINDINGS: There is a small right pleural effusion. There is mild bibasilar atelectasis. There is no edema or consolidation. There is cardiomegaly with pulmonary vascularity within normal limits. No adenopathy. There is mild anterior wedging of an upper thoracic vertebral body. IMPRESSION: Small right pleural effusion with bibasilar atelectasis. No edema or consolidation. There is cardiomegaly.  No evident adenopathy. Electronically Signed   By: Lowella Grip III M.D.   On: 10/11/2019 12:59    Pending Labs Unresulted Labs (From admission, onward)    Start     Ordered   10/12/19 2703  Basic metabolic panel  Daily,   R     10/11/19 1733   10/11/19 1734  CBC  (heparin)  Once,   STAT    Comments: Baseline for heparin therapy IF NOT ALREADY DRAWN.  Notify MD if PLT < 100 K.    10/11/19 1733   10/11/19 1734  Creatinine, serum  (heparin)  Once,   STAT    Comments: Baseline for heparin therapy IF NOT ALREADY DRAWN.    10/11/19 1733   10/11/19 1617  SARS CORONAVIRUS 2 (TAT 6-24 HRS) Nasopharyngeal Nasopharyngeal Swab  (Asymptomatic/Tier 2 Patients Labs)  Once,   STAT    Question Answer Comment  Is this test for diagnosis or screening Screening   Symptomatic for COVID-19 as defined by CDC No   Hospitalized for COVID-19 No   Admitted to ICU for COVID-19 No   Previously tested for COVID-19 No    Resident in a congregate (group) care setting No   Employed in healthcare setting No   Pregnant No      10/11/19 1616          Vitals/Pain Today's Vitals   10/11/19 1821 10/11/19 1827 10/11/19 1830 10/11/19 1900  BP: 98/62 (!) 105/57 101/62 115/87  Pulse:  Resp: (!) 21 19 19  (!) 26  Temp:      TempSrc:      SpO2:        Isolation Precautions No active isolations  Medications Medications  furosemide (LASIX) injection 80 mg (has no administration in time range)  atorvastatin (LIPITOR) tablet 40 mg (40 mg Oral Given 10/11/19 1827)  allopurinol (ZYLOPRIM) tablet 300 mg (300 mg Oral Given 10/11/19 1827)  traMADol (ULTRAM) tablet 50 mg (has no administration in time range)  cyclobenzaprine (FLEXERIL) tablet 10 mg (has no administration in time range)  sodium chloride flush (NS) 0.9 % injection 3 mL (has no administration in time range)  sodium chloride flush (NS) 0.9 % injection 3 mL (has no administration in time range)  0.9 %  sodium chloride infusion (has no administration in time range)  acetaminophen (TYLENOL) tablet 650 mg (has no administration in time range)  ondansetron (ZOFRAN) injection 4 mg (has no administration in time range)  heparin injection 5,000 Units (5,000 Units Subcutaneous Given 10/11/19 1826)  furosemide (LASIX) injection 80 mg (has no administration in time range)    Mobility walks     Focused Assessments Cardiac Assessment Handoff:  Cardiac Rhythm: Normal sinus rhythm No results found for: CKTOTAL, CKMB, CKMBINDEX, TROPONINI No results found for: DDIMER Does the Patient currently have chest pain? No      R Recommendations: See Admitting Provider Note  Report given to:   Additional Notes: N/A

## 2019-10-12 ENCOUNTER — Inpatient Hospital Stay (HOSPITAL_COMMUNITY): Payer: Medicare Other

## 2019-10-12 DIAGNOSIS — I361 Nonrheumatic tricuspid (valve) insufficiency: Secondary | ICD-10-CM | POA: Diagnosis not present

## 2019-10-12 DIAGNOSIS — I509 Heart failure, unspecified: Secondary | ICD-10-CM | POA: Diagnosis not present

## 2019-10-12 DIAGNOSIS — I5043 Acute on chronic combined systolic (congestive) and diastolic (congestive) heart failure: Secondary | ICD-10-CM | POA: Diagnosis not present

## 2019-10-12 DIAGNOSIS — Z6841 Body Mass Index (BMI) 40.0 and over, adult: Secondary | ICD-10-CM | POA: Diagnosis not present

## 2019-10-12 DIAGNOSIS — I132 Hypertensive heart and chronic kidney disease with heart failure and with stage 5 chronic kidney disease, or end stage renal disease: Secondary | ICD-10-CM | POA: Diagnosis not present

## 2019-10-12 DIAGNOSIS — N179 Acute kidney failure, unspecified: Secondary | ICD-10-CM | POA: Diagnosis not present

## 2019-10-12 DIAGNOSIS — J159 Unspecified bacterial pneumonia: Secondary | ICD-10-CM | POA: Diagnosis not present

## 2019-10-12 DIAGNOSIS — R0602 Shortness of breath: Secondary | ICD-10-CM | POA: Diagnosis present

## 2019-10-12 DIAGNOSIS — I10 Essential (primary) hypertension: Secondary | ICD-10-CM

## 2019-10-12 DIAGNOSIS — E1122 Type 2 diabetes mellitus with diabetic chronic kidney disease: Secondary | ICD-10-CM | POA: Diagnosis present

## 2019-10-12 DIAGNOSIS — I081 Rheumatic disorders of both mitral and tricuspid valves: Secondary | ICD-10-CM | POA: Diagnosis present

## 2019-10-12 DIAGNOSIS — I472 Ventricular tachycardia: Secondary | ICD-10-CM | POA: Diagnosis not present

## 2019-10-12 DIAGNOSIS — E871 Hypo-osmolality and hyponatremia: Secondary | ICD-10-CM | POA: Diagnosis not present

## 2019-10-12 DIAGNOSIS — I340 Nonrheumatic mitral (valve) insufficiency: Secondary | ICD-10-CM

## 2019-10-12 DIAGNOSIS — R0600 Dyspnea, unspecified: Secondary | ICD-10-CM | POA: Diagnosis not present

## 2019-10-12 DIAGNOSIS — R7989 Other specified abnormal findings of blood chemistry: Secondary | ICD-10-CM | POA: Diagnosis not present

## 2019-10-12 DIAGNOSIS — I70208 Unspecified atherosclerosis of native arteries of extremities, other extremity: Secondary | ICD-10-CM | POA: Diagnosis present

## 2019-10-12 DIAGNOSIS — I428 Other cardiomyopathies: Secondary | ICD-10-CM | POA: Diagnosis present

## 2019-10-12 DIAGNOSIS — R34 Anuria and oliguria: Secondary | ICD-10-CM | POA: Diagnosis not present

## 2019-10-12 DIAGNOSIS — I5041 Acute combined systolic (congestive) and diastolic (congestive) heart failure: Secondary | ICD-10-CM | POA: Diagnosis not present

## 2019-10-12 DIAGNOSIS — I4819 Other persistent atrial fibrillation: Secondary | ICD-10-CM | POA: Diagnosis not present

## 2019-10-12 DIAGNOSIS — G9340 Encephalopathy, unspecified: Secondary | ICD-10-CM | POA: Diagnosis not present

## 2019-10-12 DIAGNOSIS — I482 Chronic atrial fibrillation, unspecified: Secondary | ICD-10-CM | POA: Diagnosis not present

## 2019-10-12 DIAGNOSIS — R570 Cardiogenic shock: Secondary | ICD-10-CM | POA: Diagnosis not present

## 2019-10-12 DIAGNOSIS — D509 Iron deficiency anemia, unspecified: Secondary | ICD-10-CM | POA: Diagnosis present

## 2019-10-12 DIAGNOSIS — I4891 Unspecified atrial fibrillation: Secondary | ICD-10-CM | POA: Diagnosis not present

## 2019-10-12 DIAGNOSIS — U071 COVID-19: Secondary | ICD-10-CM | POA: Diagnosis not present

## 2019-10-12 DIAGNOSIS — I5082 Biventricular heart failure: Secondary | ICD-10-CM | POA: Diagnosis present

## 2019-10-12 DIAGNOSIS — E785 Hyperlipidemia, unspecified: Secondary | ICD-10-CM | POA: Diagnosis present

## 2019-10-12 DIAGNOSIS — N186 End stage renal disease: Secondary | ICD-10-CM | POA: Diagnosis not present

## 2019-10-12 DIAGNOSIS — I5023 Acute on chronic systolic (congestive) heart failure: Secondary | ICD-10-CM | POA: Diagnosis not present

## 2019-10-12 DIAGNOSIS — I272 Pulmonary hypertension, unspecified: Secondary | ICD-10-CM | POA: Diagnosis present

## 2019-10-12 DIAGNOSIS — L89892 Pressure ulcer of other site, stage 2: Secondary | ICD-10-CM | POA: Diagnosis not present

## 2019-10-12 DIAGNOSIS — J1289 Other viral pneumonia: Secondary | ICD-10-CM | POA: Diagnosis not present

## 2019-10-12 DIAGNOSIS — I5021 Acute systolic (congestive) heart failure: Secondary | ICD-10-CM | POA: Diagnosis not present

## 2019-10-12 DIAGNOSIS — J9601 Acute respiratory failure with hypoxia: Secondary | ICD-10-CM | POA: Diagnosis not present

## 2019-10-12 DIAGNOSIS — D631 Anemia in chronic kidney disease: Secondary | ICD-10-CM | POA: Diagnosis present

## 2019-10-12 DIAGNOSIS — Z992 Dependence on renal dialysis: Secondary | ICD-10-CM | POA: Diagnosis not present

## 2019-10-12 LAB — CBC WITH DIFFERENTIAL/PLATELET
Abs Immature Granulocytes: 0.01 10*3/uL (ref 0.00–0.07)
Basophils Absolute: 0 10*3/uL (ref 0.0–0.1)
Basophils Relative: 0 %
Eosinophils Absolute: 0 10*3/uL (ref 0.0–0.5)
Eosinophils Relative: 0 %
HCT: 30.6 % — ABNORMAL LOW (ref 36.0–46.0)
Hemoglobin: 9.2 g/dL — ABNORMAL LOW (ref 12.0–15.0)
Immature Granulocytes: 0 %
Lymphocytes Relative: 26 %
Lymphs Abs: 1.4 10*3/uL (ref 0.7–4.0)
MCH: 23.7 pg — ABNORMAL LOW (ref 26.0–34.0)
MCHC: 30.1 g/dL (ref 30.0–36.0)
MCV: 78.7 fL — ABNORMAL LOW (ref 80.0–100.0)
Monocytes Absolute: 0.8 10*3/uL (ref 0.1–1.0)
Monocytes Relative: 15 %
Neutro Abs: 3.2 10*3/uL (ref 1.7–7.7)
Neutrophils Relative %: 59 %
Platelets: 156 10*3/uL (ref 150–400)
RBC: 3.89 MIL/uL (ref 3.87–5.11)
RDW: 17.3 % — ABNORMAL HIGH (ref 11.5–15.5)
WBC: 5.3 10*3/uL (ref 4.0–10.5)
nRBC: 0 % (ref 0.0–0.2)

## 2019-10-12 LAB — COMPREHENSIVE METABOLIC PANEL
ALT: 51 U/L — ABNORMAL HIGH (ref 0–44)
AST: 68 U/L — ABNORMAL HIGH (ref 15–41)
Albumin: 3.6 g/dL (ref 3.5–5.0)
Alkaline Phosphatase: 181 U/L — ABNORMAL HIGH (ref 38–126)
Anion gap: 17 — ABNORMAL HIGH (ref 5–15)
BUN: 48 mg/dL — ABNORMAL HIGH (ref 8–23)
CO2: 22 mmol/L (ref 22–32)
Calcium: 9.2 mg/dL (ref 8.9–10.3)
Chloride: 97 mmol/L — ABNORMAL LOW (ref 98–111)
Creatinine, Ser: 1.97 mg/dL — ABNORMAL HIGH (ref 0.44–1.00)
GFR calc Af Amer: 29 mL/min — ABNORMAL LOW (ref >60)
GFR calc non Af Amer: 25 mL/min — ABNORMAL LOW (ref >60)
Glucose, Bld: 105 mg/dL — ABNORMAL HIGH (ref 70–99)
Potassium: 3.9 mmol/L (ref 3.5–5.1)
Sodium: 136 mmol/L (ref 135–145)
Total Bilirubin: 1.7 mg/dL — ABNORMAL HIGH (ref 0.3–1.2)
Total Protein: 6.5 g/dL (ref 6.5–8.1)

## 2019-10-12 LAB — URINALYSIS, ROUTINE W REFLEX MICROSCOPIC
Bilirubin Urine: NEGATIVE
Glucose, UA: NEGATIVE mg/dL
Ketones, ur: NEGATIVE mg/dL
Leukocytes,Ua: NEGATIVE
Nitrite: NEGATIVE
Protein, ur: NEGATIVE mg/dL
Specific Gravity, Urine: 1.009 (ref 1.005–1.030)
pH: 5 (ref 5.0–8.0)

## 2019-10-12 LAB — CREATININE, URINE, RANDOM: Creatinine, Urine: 67.9 mg/dL

## 2019-10-12 LAB — BASIC METABOLIC PANEL
Anion gap: 16 — ABNORMAL HIGH (ref 5–15)
BUN: 48 mg/dL — ABNORMAL HIGH (ref 8–23)
CO2: 23 mmol/L (ref 22–32)
Calcium: 9.4 mg/dL (ref 8.9–10.3)
Chloride: 98 mmol/L (ref 98–111)
Creatinine, Ser: 1.9 mg/dL — ABNORMAL HIGH (ref 0.44–1.00)
GFR calc Af Amer: 30 mL/min — ABNORMAL LOW (ref >60)
GFR calc non Af Amer: 26 mL/min — ABNORMAL LOW (ref >60)
Glucose, Bld: 103 mg/dL — ABNORMAL HIGH (ref 70–99)
Potassium: 3.7 mmol/L (ref 3.5–5.1)
Sodium: 137 mmol/L (ref 135–145)

## 2019-10-12 LAB — SARS CORONAVIRUS 2 (TAT 6-24 HRS)
SARS Coronavirus 2: POSITIVE — AB
SARS Coronavirus 2: POSITIVE — AB

## 2019-10-12 LAB — HEPARIN LEVEL (UNFRACTIONATED)
Heparin Unfractionated: >2.2 IU/mL — ABNORMAL HIGH (ref 0.30–0.70)
Heparin Unfractionated: >2.2 IU/mL — ABNORMAL HIGH (ref 0.30–0.70)

## 2019-10-12 LAB — ECHOCARDIOGRAM LIMITED
Height: 63 in
Weight: 3971.81 oz

## 2019-10-12 LAB — TROPONIN I (HIGH SENSITIVITY)
Troponin I (High Sensitivity): 27 ng/L — ABNORMAL HIGH (ref <18)
Troponin I (High Sensitivity): 31 ng/L — ABNORMAL HIGH (ref <18)

## 2019-10-12 LAB — PROTIME-INR
INR: 1.8 — ABNORMAL HIGH (ref 0.8–1.2)
Prothrombin Time: 20.3 seconds — ABNORMAL HIGH (ref 11.4–15.2)

## 2019-10-12 LAB — D-DIMER, QUANTITATIVE: D-Dimer, Quant: 12.41 ug/mL-FEU — ABNORMAL HIGH (ref 0.00–0.50)

## 2019-10-12 LAB — APTT: aPTT: >200 seconds (ref 24–36)

## 2019-10-12 LAB — FERRITIN: Ferritin: 7 ng/mL — ABNORMAL LOW (ref 11–307)

## 2019-10-12 LAB — FIBRINOGEN: Fibrinogen: 253 mg/dL (ref 210–475)

## 2019-10-12 LAB — PROTEIN / CREATININE RATIO, URINE
Creatinine, Urine: 59.39 mg/dL
Protein Creatinine Ratio: 0.27 mg/mg{Cre} — ABNORMAL HIGH (ref 0.00–0.15)
Total Protein, Urine: 16 mg/dL

## 2019-10-12 LAB — OSMOLALITY: Osmolality: 301 mOsm/kg — ABNORMAL HIGH (ref 275–295)

## 2019-10-12 LAB — ABO/RH: ABO/RH(D): A POS

## 2019-10-12 LAB — SODIUM, URINE, RANDOM: Sodium, Ur: 59 mmol/L

## 2019-10-12 LAB — LACTATE DEHYDROGENASE: LDH: 327 U/L — ABNORMAL HIGH (ref 98–192)

## 2019-10-12 LAB — OSMOLALITY, URINE: Osmolality, Ur: 341 mOsm/kg (ref 300–900)

## 2019-10-12 LAB — C-REACTIVE PROTEIN: CRP: <0.8 mg/dL (ref <1.0)

## 2019-10-12 MED ORDER — HEPARIN (PORCINE) 25000 UT/250ML-% IV SOLN
1100.0000 [IU]/h | INTRAVENOUS | Status: DC
  Administered 2019-10-12: 1100 [IU]/h via INTRAVENOUS
  Filled 2019-10-12: qty 250

## 2019-10-12 MED ORDER — TECHNETIUM TO 99M ALBUMIN AGGREGATED
1.5000 | Freq: Once | INTRAVENOUS | Status: AC | PRN
  Administered 2019-10-12: 1.5 via INTRAVENOUS

## 2019-10-12 MED ORDER — FUROSEMIDE 10 MG/ML IJ SOLN
80.0000 mg | Freq: Two times a day (BID) | INTRAMUSCULAR | Status: DC
  Administered 2019-10-13 – 2019-10-14 (×3): 80 mg via INTRAVENOUS
  Filled 2019-10-12 (×4): qty 8

## 2019-10-12 MED ORDER — CHLORHEXIDINE GLUCONATE CLOTH 2 % EX PADS
6.0000 | MEDICATED_PAD | Freq: Every day | CUTANEOUS | Status: DC
  Administered 2019-10-12 – 2019-11-05 (×23): 6 via TOPICAL

## 2019-10-12 MED ORDER — HEPARIN BOLUS VIA INFUSION
3000.0000 [IU] | Freq: Once | INTRAVENOUS | Status: AC
  Administered 2019-10-12: 3000 [IU] via INTRAVENOUS
  Filled 2019-10-12: qty 3000

## 2019-10-12 MED ORDER — DEXAMETHASONE SODIUM PHOSPHATE 10 MG/ML IJ SOLN
6.0000 mg | INTRAMUSCULAR | Status: DC
  Administered 2019-10-12 – 2019-10-14 (×3): 6 mg via INTRAVENOUS
  Filled 2019-10-12: qty 1
  Filled 2019-10-12: qty 0.6
  Filled 2019-10-12: qty 1
  Filled 2019-10-12: qty 0.6

## 2019-10-12 MED ORDER — FUROSEMIDE 10 MG/ML IJ SOLN
80.0000 mg | Freq: Every day | INTRAMUSCULAR | Status: DC
  Administered 2019-10-12: 80 mg via INTRAVENOUS
  Filled 2019-10-12: qty 8

## 2019-10-12 MED ORDER — SODIUM CHLORIDE 0.9 % IV SOLN
100.0000 mg | INTRAVENOUS | Status: DC
  Administered 2019-10-13 – 2019-10-14 (×2): 100 mg via INTRAVENOUS
  Filled 2019-10-12 (×2): qty 20

## 2019-10-12 MED ORDER — SODIUM CHLORIDE 0.9 % IV SOLN
200.0000 mg | Freq: Once | INTRAVENOUS | Status: AC
  Administered 2019-10-12: 200 mg via INTRAVENOUS
  Filled 2019-10-12: qty 40

## 2019-10-12 MED ORDER — ZINC SULFATE 220 (50 ZN) MG PO CAPS
220.0000 mg | ORAL_CAPSULE | Freq: Every day | ORAL | Status: DC
  Administered 2019-10-12 – 2019-11-05 (×23): 220 mg via ORAL
  Filled 2019-10-12 (×23): qty 1

## 2019-10-12 MED ORDER — ADULT MULTIVITAMIN W/MINERALS CH
1.0000 | ORAL_TABLET | Freq: Every day | ORAL | Status: DC
  Administered 2019-10-12 – 2019-11-01 (×21): 1 via ORAL
  Filled 2019-10-12 (×21): qty 1

## 2019-10-12 NOTE — Progress Notes (Signed)
Progress Note  Patient Name: Melissa Adams Date of Encounter: 10/12/2019  Primary Cardiologist: Lenard Simmer, [place],  [facility] at [facility].   Subjective   73 yo with AF, CHF, MR, TR admitted with CHF symptoms Found also to be Covid +  Has AKI   getting IV lasix  I/O are not recorded accurately  She thinks she caught covid after coming to hospital  We discussed that she had to have had it before coming to [place].    Inpatient Medications    Scheduled Meds: . allopurinol  300 mg Oral Daily  . atorvastatin  40 mg Oral Daily  . dexamethasone (DECADRON) injection  6 mg Intravenous Q24H  . furosemide  80 mg Intravenous Daily  . heparin  5,000 Units Subcutaneous Q8H  . multivitamin with minerals  1 tablet Oral Daily  . sodium chloride flush  3 mL Intravenous Q12H  . zinc sulfate  220 mg Oral Daily   Continuous Infusions: . sodium chloride    . remdesivir 200 mg in NS 250 mL 200 mg (10/12/19 7902)   Followed by  . [START ON 10/13/2019] remdesivir 100 mg in NS 250 mL     PRN Meds: sodium chloride, acetaminophen, cyclobenzaprine, ondansetron (ZOFRAN) IV, sodium chloride flush, traMADol   Vital Signs    Vitals:   10/11/19 2344 10/11/19 2353 10/12/19 0500 10/12/19 0826  BP: 116/66   109/69  Pulse: (!) 57   90  Resp: 19 19  20   Temp: (!) 97.5 F (36.4 C)   97.6 F (36.4 C)  TempSrc: Oral   Oral  SpO2: 99%   96%  Weight:   112.6 kg   Height:        Intake/Output Summary (Last 24 hours) at 10/12/2019 0845 Last data filed at 10/12/2019 4097 Gross per 24 hour  Intake 3 ml  Output -  Net 3 ml   Last 3 Weights 10/12/2019 10/11/2019  Weight (lbs) 248 lb 3.8 oz 249 lb 1.9 oz  Weight (kg) 112.6 kg 113 kg      Telemetry    Sinus rhythm - Personally Reviewed  ECG     - Personally Reviewed  Physical Exam   GEN:  obese, elderly female   Neck: No JVD Cardiac: RRR,  Soft systolic murmur  Respiratory: Clear to auscultation bilaterally. GI: Soft, nontender,  non-distended  MS: No edema; No deformity. Neuro:  Nonfocal  Psych: Normal affect   Labs    High Sensitivity Troponin:   Recent Labs  Lab 10/11/19 1309 10/11/19 1616 10/12/19 0431 10/12/19 0500  TROPONINIHS 30* 33* 27* 31*      Chemistry Recent Labs  Lab 10/11/19 1309 10/12/19 0213 10/12/19 0433  NA 137 137 136  K 4.1 3.7 3.9  CL 97* 98 97*  CO2 24 23 22   GLUCOSE 97 103* 105*  BUN 47* 48* 48*  CREATININE 1.91* 1.90* 1.97*  CALCIUM 9.5 9.4 9.2  PROT  --   --  6.5  ALBUMIN  --   --  3.6  AST  --   --  68*  ALT  --   --  51*  ALKPHOS  --   --  181*  BILITOT  --   --  1.7*  GFRNONAA 26* 26* 25*  GFRAA 30* 30* 29*  ANIONGAP 16* 16* 17*     Hematology Recent Labs  Lab 10/11/19 1309 10/12/19 0431  WBC 6.2 5.3  RBC 4.10 3.89  HGB 9.7* 9.2*  HCT 33.4* 30.6*  MCV 81.5 78.7*  MCH 23.7* 23.7*  MCHC 29.0* 30.1  RDW 17.2* 17.3*  PLT 216 156    BNP Recent Labs  Lab 10/11/19 1309  BNP 3,374.7*     DDimer  Recent Labs  Lab 10/12/19 0431  DDIMER 12.41*     Radiology    Dg Chest 2 View  Result Date: 10/11/2019 CLINICAL DATA:  Shortness of breath EXAM: CHEST - 2 VIEW COMPARISON:  None FINDINGS: There is a small right pleural effusion. There is mild bibasilar atelectasis. There is no edema or consolidation. There is cardiomegaly with pulmonary vascularity within normal limits. No adenopathy. There is mild anterior wedging of an upper thoracic vertebral body. IMPRESSION: Small right pleural effusion with bibasilar atelectasis. No edema or consolidation. There is cardiomegaly.  No evident adenopathy. Electronically Signed   By: Lowella Grip III M.D.   On: 10/11/2019 12:59    Cardiac Studies     Patient Profile     73 y.o. female   [place]    1.  CHF:   Elevated JVD.   Leg edema  D-dimer is elevated - Dr. Ree Kida will order a V/Q scan - undable to get a CT angio due to renal dysfnction  2.         For questions or updates, please  contact [facility] Please consult [email or web address] for contact info under        Signed, Mertie Moores, MD  10/12/2019, 8:45 AM

## 2019-10-12 NOTE — Consult Note (Signed)
[facility]  HISTORY AND PHYSICAL  Melissa Adams is an 73 y.o. female.    Chief Complaint: abnormal labs  HPI: Pt is a 54F with a PMH sig for HTN, HLD, Afib, sleep apnea who is now seen in consultation for AKI.  Pt's baseline cr is 1.2 as of Sept 2020.  She has been experiencing 2-3 weeks of increased SOB and weight gain.  Was taking Lasix 80 mg BID and was instructed to increase to Lasix 160 mg BID.  This hasn't helped.  She had labs with her cardiology office and Cr was reportedly 1.8.  Was told to present to ED.  She is here visiting dtr and so went to [facility] ED.  No ACEi/ARB listed in chart.  Noted COVID + on admission.  Of note, patient was due for a mitral valve and tricuspid valve replacement 10/13/2019 in [place].  States that she's gained about 22 lbs.  So far has gotten 80 IV Lasix x 2.  UOP not charted but per PMD has been urinating.  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  Renal US pending.  Cr has been stable at 1.9 here.  Requiring O2.  Has been placed on hep gtt and getting remdesivir.  PMH: Past Medical History:  Diagnosis Date  . A-fib ([place])   . Cardiomyopathy ([place])   . CHF (congestive heart failure) ([place])   . Edema   . Hypertension   . Pulmonary hypertension ([place])   . Sleep apnea   . Tricuspid regurgitation    PSH: History reviewed. No pertinent surgical history.   Past Medical History:  Diagnosis Date  . A-fib ([place])   . Cardiomyopathy ([place])   . CHF (congestive heart failure) ([place])   . Edema   . Hypertension   . Pulmonary hypertension ([place])   . Sleep apnea   . Tricuspid regurgitation     Medications:   Scheduled: . allopurinol  300 mg Oral Daily  . atorvastatin  40 mg Oral Daily  . Chlorhexidine Gluconate Cloth  6 each Topical Daily  . dexamethasone (DECADRON) injection  6 mg Intravenous Q24H  . furosemide  80 mg Intravenous Daily  . multivitamin with minerals  1 tablet Oral Daily  . sodium chloride flush  3 mL  Intravenous Q12H  . zinc sulfate  220 mg Oral Daily    Medications Prior to Admission  Medication Sig Dispense Refill  . allopurinol (ZYLOPRIM) 300 MG tablet Take 300 mg by mouth daily.    Marland Kitchen apixaban (ELIQUIS) 5 MG TABS tablet Take 5 mg by mouth 2 (two) times daily.    Marland Kitchen atorvastatin (LIPITOR) 40 MG tablet Take 40 mg by mouth daily.    . colchicine 0.6 MG tablet Take 0.6 mg by mouth as needed (for Gout).     . furosemide (LASIX) 80 MG tablet Take 80 mg by mouth daily.     . metoprolol succinate (TOPROL-XL) 100 MG 24 hr tablet Take 50 mg by mouth daily. Take with or immediately following a meal.     . montelukast (SINGULAIR) 10 MG tablet Take 10 mg by mouth daily.      ALLERGIES:  No Known Allergies  FAM HX: Family History  Problem Relation Age of Onset  . Seizures Father   . CAD Father   . Diabetes Sister   . Lupus Sister   . Hypertension Sister     Social History:   reports that she has never smoked. She has never used  smokeless tobacco. She reports previous alcohol use. No history on file for drug.  ROS: ROS: unobtainable   Blood pressure 92/77, pulse (!) 51, temperature 98 F (36.7 C), temperature source Oral, resp. rate (!) 21, height 5\' 3"  (1.6 m), weight 112.6 kg, SpO2 96 %. PHYSICAL EXAM: Physical Exam: pt not seen in effort to preserve PPE--> relying on exam by PMD and RN assessments    Results for orders placed or performed during the hospital encounter of 10/11/19 (from the past 48 hour(s))  Basic metabolic panel     Status: Abnormal   Collection Time: 10/11/19  1:09 PM  Result Value Ref Range   Sodium 137 135 - 145 mmol/L   Potassium 4.1 3.5 - 5.1 mmol/L   Chloride 97 (L) 98 - 111 mmol/L   CO2 24 22 - 32 mmol/L   Glucose, Bld 97 70 - 99 mg/dL   BUN 47 (H) 8 - 23 mg/dL   Creatinine, Ser 1.91 (H) 0.44 - 1.00 mg/dL   Calcium 9.5 8.9 - 10.3 mg/dL   GFR calc non Af Amer 26 (L) >60 mL/min   GFR calc Af Amer 30 (L) >60 mL/min   Anion gap 16 (H) 5 - 15     Comment: Performed at [facility], [address] [place], [place] [address]  CBC     Status: Abnormal   Collection Time: 10/11/19  1:09 PM  Result Value Ref Range   WBC 6.2 4.0 - 10.5 K/uL   RBC 4.10 3.87 - 5.11 MIL/uL   Hemoglobin 9.7 (L) 12.0 - 15.0 g/dL   HCT 33.4 (L) 36.0 - 46.0 %   MCV 81.5 80.0 - 100.0 fL   MCH 23.7 (L) 26.0 - 34.0 pg   MCHC 29.0 (L) 30.0 - 36.0 g/dL   RDW 17.2 (H) 11.5 - 15.5 %   Platelets 216 150 - 400 K/uL   nRBC 0.3 (H) 0.0 - 0.2 %    Comment: Performed at [facility], [place] [address] [place], [place] [address]  Troponin I (High Sensitivity)     Status: Abnormal   Collection Time: 10/11/19  1:09 PM  Result Value Ref Range   Troponin I (High Sensitivity) 30 (H) <18 ng/L    Comment: (NOTE) Elevated high sensitivity troponin I (hsTnI) values and significant  changes across serial measurements may suggest ACS but many other  chronic and acute conditions are known to elevate hsTnI results.  Refer to the "Links" section for chest pain algorithms and additional  guidance. Performed at [facility], [place] [address] [place], [place] [address]   Brain natriuretic peptide     Status: Abnormal   Collection Time: 10/11/19  1:09 PM  Result Value Ref Range   B Natriuretic Peptide 3,374.7 (H) 0.0 - 100.0 pg/mL    Comment: Performed at [facility] [address] [place], [place] [address]  Troponin I (High Sensitivity)     Status: Abnormal   Collection Time: 10/11/19  4:16 PM  Result Value Ref Range   Troponin I (High Sensitivity) 33 (H) <18 ng/L    Comment: (NOTE) Elevated high sensitivity troponin I (hsTnI) values and significant  changes across serial measurements may suggest ACS but many other  chronic and acute conditions are known to elevate hsTnI results.  Refer to the "Links" section for chest pain algorithms and additional  guidance. Performed at [facility], [place] [address] [place], [place] [address]  SARS  CORONAVIRUS 2 (TAT 6-24 HRS) Nasopharyngeal Nasopharyngeal Swab     Status: Abnormal   Collection Time: 10/11/19  4:17 PM   Specimen: Nasopharyngeal Swab  Result Value Ref Range   SARS Coronavirus 2 POSITIVE (A) NEGATIVE    Comment: RESULT CALLED TO, READ BACK BY AND VERIFIED WITH: A.KIRBY,RN [id number] 10/12/19 G.MCADOO (NOTE) SARS-CoV-2 target nucleic acids are DETECTED. The SARS-CoV-2 RNA is generally detectable in upper and lower respiratory specimens during the acute phase of infection. Positive results are indicative of active infection with SARS-CoV-2. Clinical  correlation with patient history and other diagnostic information is necessary to determine patient infection status. Positive results do  not rule out bacterial infection or co-infection with other viruses. The expected result is Negative. Fact Sheet for Patients: SugarRoll.be Fact Sheet for Healthcare Providers: https://www.woods-mathews.com/ This test is not yet approved or cleared by the [place] FDA and  has been authorized for detection and/or diagnosis of SARS-CoV-2 by FDA under an Emergency Use Authorization (EUA). This EUA will remain  in effect (meaning this test can be used) for the  duration of the COVID-19 declaration under Section 564(b)(1) of the Act, 21 U.S.C. section 360bbb-3(b)(1), unless the authorization is terminated or revoked sooner. Performed at [facility], [place] [address] [place], [place] [address]   MRSA PCR Screening     Status: None   Collection Time: 10/11/19  8:44 PM   Specimen: Nasopharyngeal  Result Value Ref Range   MRSA by PCR NEGATIVE NEGATIVE    Comment:        The GeneXpert MRSA Assay (FDA approved for NASAL specimens only), is one component of a comprehensive MRSA colonization surveillance program. It is not intended to diagnose MRSA infection nor to guide or monitor treatment for MRSA infections. Performed at [facility], [place] [address], [place], [place] [address]   Basic metabolic panel     Status: Abnormal   Collection Time: 10/12/19  2:13 AM  Result Value Ref Range   Sodium 137 135 - 145 mmol/L   Potassium 3.7 3.5 - 5.1 mmol/L   Chloride 98 98 - 111 mmol/L   CO2 23 22 - 32 mmol/L   Glucose, Bld 103 (H) 70 - 99 mg/dL   BUN 48 (H) 8 - 23 mg/dL   Creatinine, Ser 1.90 (H) 0.44 - 1.00 mg/dL   Calcium 9.4 8.9 - 10.3 mg/dL   GFR calc non Af Amer 26 (L) >60 mL/min   GFR calc Af Amer 30 (L) >60 mL/min   Anion gap 16 (H) 5 - 15    Comment: Performed at [facility], [place] [address], [place], [place] [address]  D-dimer, quantitative (not at [facility])     Status: Abnormal   Collection Time: 10/12/19  4:31 AM  Result Value Ref Range   D-Dimer, Quant 12.41 (H) 0.00 - 0.50 ug/mL-FEU    Comment: (NOTE) At the manufacturer cut-off of 0.50 ug/mL FEU, this assay has been documented to exclude PE with a sensitivity and negative predictive value of 97 to 99%.  At this time, this assay has not been approved by the FDA to exclude DVT/VTE. Results should be correlated with clinical presentation. Performed at [facility], [place] [address], [place], [place] [address]   Ferritin     Status: Abnormal   Collection Time: 10/12/19  4:31 AM  Result Value Ref Range   Ferritin 7 (L) 11 - 307 ng/mL    Comment: Performed  at [facility], [place] [address], [place], [place] [address]  Lactate dehydrogenase     Status: Abnormal   Collection Time: 10/12/19  4:31 AM  Result Value Ref Range   LDH 327 (H) 98 - 192 U/L    Comment: Performed at [facility], [place] [address] [place], [place] [address]  Troponin I (High Sensitivity)     Status: Abnormal   Collection Time: 10/12/19  4:31 AM  Result Value Ref Range   Troponin I (High Sensitivity) 27 (H) <18 ng/L    Comment: (NOTE) Elevated high sensitivity troponin I (hsTnI) values and significant  changes across serial measurements may suggest ACS but many  other  chronic and acute conditions are known to elevate hsTnI results.  Refer to the "Links" section for chest pain algorithms and additional  guidance. Performed at [facility], [place] [address], [place], [place] [address]   CBC with Differential/Platelet     Status: Abnormal   Collection Time: 10/12/19  4:31 AM  Result Value Ref Range   WBC 5.3 4.0 - 10.5 K/uL   RBC 3.89 3.87 - 5.11 MIL/uL   Hemoglobin 9.2 (L) 12.0 - 15.0 g/dL   HCT 30.6 (L) 36.0 - 46.0 %   MCV 78.7 (L) 80.0 - 100.0 fL   MCH 23.7 (L) 26.0 - 34.0 pg   MCHC 30.1 30.0 - 36.0 g/dL   RDW 17.3 (H) 11.5 - 15.5 %   Platelets 156 150 - 400 K/uL    Comment: REPEATED TO VERIFY   nRBC 0.0 0.0 - 0.2 %   Neutrophils Relative % 59 %   Neutro Abs 3.2 1.7 - 7.7 K/uL   Lymphocytes Relative 26 %   Lymphs Abs 1.4 0.7 - 4.0 K/uL   Monocytes Relative 15 %   Monocytes Absolute 0.8 0.1 - 1.0 K/uL   Eosinophils Relative 0 %   Eosinophils Absolute 0.0 0.0 - 0.5 K/uL   Basophils Relative 0 %   Basophils Absolute 0.0 0.0 - 0.1 K/uL   Immature Granulocytes 0 %   Abs Immature Granulocytes 0.01 0.00 - 0.07 K/uL    Comment: Performed at [facility], [place] [address], [place], [place] [address]  Protime-INR     Status: Abnormal   Collection Time: 10/12/19  4:31 AM  Result Value Ref Range   Prothrombin Time 20.3 (H) 11.4 - 15.2 seconds   INR 1.8 (H) 0.8 - 1.2    Comment: (NOTE) INR goal varies based on device and disease states. Performed at [facility], [place] [address], [place], [place] [address]   Fibrinogen     Status: None   Collection Time: 10/12/19  4:31 AM  Result Value Ref Range   Fibrinogen 253 210 - 475 mg/dL    Comment: Performed at [facility] [address], [place], [place] [address]  C-reactive protein     Status: None   Collection Time: 10/12/19  4:31 AM  Result Value Ref Range   CRP <0.8 <1.0 mg/dL    Comment: Performed at [facility] [address] [place], [place] [address]   ABO/Rh     Status: None   Collection Time: 10/12/19  4:33 AM  Result Value Ref Range   ABO/RH(D)      A POS Performed at [facility] [address], [place],  [address]   Comprehensive metabolic panel     Status: Abnormal   Collection  Time: 10/12/19  4:33 AM  Result Value Ref Range   Sodium 136 135 - 145 mmol/L   Potassium 3.9 3.5 - 5.1 mmol/L   Chloride 97 (L) 98 - 111 mmol/L   CO2 22 22 - 32 mmol/L   Glucose, Bld 105 (H) 70 - 99 mg/dL   BUN 48 (H) 8 - 23 mg/dL   Creatinine, Ser 1.97 (H) 0.44 - 1.00 mg/dL   Calcium 9.2 8.9 - 10.3 mg/dL   Total Protein 6.5 6.5 - 8.1 g/dL   Albumin 3.6 3.5 - 5.0 g/dL   AST 68 (H) 15 - 41 U/L   ALT 51 (H) 0 - 44 U/L   Alkaline Phosphatase 181 (H) 38 - 126 U/L   Total Bilirubin 1.7 (H) 0.3 - 1.2 mg/dL   GFR calc non Af Amer 25 (L) >60 mL/min   GFR calc Af Amer 29 (L) >60 mL/min   Anion gap 17 (H) 5 - 15    Comment: Performed at [facility], [address], [place], [place] [address]  Troponin I (High Sensitivity)     Status: Abnormal   Collection Time: 10/12/19  5:00 AM  Result Value Ref Range   Troponin I (High Sensitivity) 31 (H) <18 ng/L    Comment: (NOTE) Elevated high sensitivity troponin I (hsTnI) values and significant  changes across serial measurements may suggest ACS but many other  chronic and acute conditions are known to elevate hsTnI results.  Refer to the "Links" section for chest pain algorithms and additional  guidance. Performed at [facility], [place] [address] [place], [place] [address]   Osmolality     Status: Abnormal   Collection Time: 10/12/19  9:23 AM  Result Value Ref Range   Osmolality 301 (H) 275 - 295 mOsm/kg    Comment: Performed at [facility] [address] [place], [place] [address]  Sodium, urine, random     Status: None   Collection Time: 10/12/19 10:50 AM  Result Value Ref Range   Sodium, Ur 59 mmol/L    Comment: Performed at [facility] [address], [place], [place] [address]  Creatinine, urine, random     Status: None   Collection Time: 10/12/19 10:50 AM  Result Value Ref Range   Creatinine, Urine 67.90 mg/dL    Comment: Performed at [facility] [address], [place], [place] [address]  Osmolality, urine     Status: None   Collection Time: 10/12/19 10:50 AM  Result Value Ref Range   Osmolality, Ur 341 300 - 900 mOsm/kg    Comment: Performed at [facility] [address] [place], [place] [address]    Dg Chest 2 View  Result Date: 10/11/2019 CLINICAL DATA:  Shortness of breath EXAM: CHEST - 2 VIEW COMPARISON:  None FINDINGS: There is a small right pleural effusion. There is mild bibasilar atelectasis. There is no edema or consolidation. There is cardiomegaly with pulmonary vascularity within normal limits. No adenopathy. There is mild anterior wedging of an upper thoracic vertebral body. IMPRESSION: Small right pleural effusion with bibasilar atelectasis. No edema or consolidation. There is cardiomegaly.  No evident adenopathy. Electronically Signed   By: Lowella Grip III M.D.   On: 10/11/2019 12:59    Assessment/Plan 1.  AKI on CKD 3: baseline Cr reportedly 1.2.  Likely d/t decompensated CHF.  Possible that COVID AKI could be playing into this but remains to be seen.  Will send UA and UP/C.  Agree with IV Lasix 80 BID--> continue to follow.  Renal US pending.  2.  Severe biventricular CHF: was due for MV and TV replacement in [place] on 11/18.  TTE performed today.  Pressures soft, metop held  3.  COVID + : on supplemental O2 on hep gtt, V/Q scan ordered, getting remdesivir  4.  Afib; was on metop and eliquis as OP  5.  Dispo: in 43M for now   Madelon Lips 10/12/2019, 3:12 PM

## 2019-10-12 NOTE — Progress Notes (Signed)
PT Cancellation Note  Patient Details Name: Melissa Adams MRN: [id number] DOB: [birth year]   Cancelled Treatment:    Reason Eval/Treat Not Completed: Patient at procedure or test/unavailable. PT attempted to see patient for evaluation however upon PT arrival RN reports pt is preparing to head off unit for procedure. PT will attempt to return as time allows.  Zenaida Niece, PT, DPT Acute Rehabilitation Pager: [phone]    Zenaida Niece 10/12/2019, 3:13 PM

## 2019-10-12 NOTE — Progress Notes (Signed)
Pt Covid-19 results came back positive. This RN was informed that there are no open beds, so pt will have to stay on this unit. Will follow up on day shift.

## 2019-10-12 NOTE — Progress Notes (Signed)
Pt transferred to 71M due to COVID 19 Positive. Handoff given to receiving RN at bedside. Pt's daughter advised of transfer and room number.

## 2019-10-12 NOTE — Consult Note (Addendum)
Advanced Heart Failure Team Consult Note   Primary Physician: System, Provider Not In PCP-Cardiologist:  Followed in [place] , Dr. Ervin Knack   Reason for Consultation: Acute on Chronic Systolic HF/ Biventricular Heart Failure   HPI:    Melissa Adams is seen today for evaluation of acute on chronic systolic HF at the request of Dr. Ree Kida, Internal Medicine.  73 y/o female, COVID +, lives in [place] and followed by cardiologist there for chronic systolic HF, mitral valve regurgitation and tricuspid valve regurgitation, who was scheduled to undergo both mitral and tricuspid valve replacement surgery on 11/18 in [place]. Also w/ h/o atrial fibrillation on chronic a/c w/ Eliquis, OSA on CPAP,  Stage III CKD and HTN. Per H&P, Echocardiogram on August 05, 2019 noted an EF of 40 to 45% (admitting team obtained this by calling patient's cardiologist, Dr. Ervin Knack).  She presented to the [facility]/16 w/ CC of progressive SOB and wt gain and LEE over the last 2-3 week. Pt reports gain of 22 lb over recent weeks. She was instructed starting 10/26 to increase her lasix to 160 mg daily by her primary cardiologist in [place]. She did this for several days and had repeat laboratory work which showed apparent increase in SCr and BUN and she was advised to stop diuretics. Condition worsened prompting her to come to the ED for evaluation. Also endorsed palpitations in the ED.   She was found to have markedly elevated BNP, 3,374 and markedly volume overloaded. Hs troponin mildly elevated w/ flat trend 30>>33>>27>>31. SCr 1.91 on admit. BUN 47. K 4.1. COVID +. CXR showed cardiomegaly w/ small rt pleural effusion w/ bibasilar atelectasis. No edema or consolidation. EKG showed ? Junctional rhythm w/ frequent PVCs. D-dimer 12.41. started on IV heparin. V/Q scan showed no evidence of PE. Pt was admitted by IM and started on IV Lasix for diuresis.   Net I/Os recorded at -1.2L however not accurate as they were not  being recorded initially. SCr improving, down from 1.97>>1.82. 2D echo completed yesterday showed severely reduced LVEF, 20-25%. RV systolic function also severely reduced. LA moderately dilated. RA severely dilated. MR reported to be mild-moderate w/o stenosis. Moderate TV regurgitation noted. Pulmonary artery pressure moderately elevated at 45 mmHg.   She is currently on supplemental O2 requirements. She has been started on remdesivir, Decadron and zinc.  Appears to be in afib on tele w/ lots of ventricular ectopy/ PVCs. HR 120s.   Echo 10/11/19   1. Left ventricular ejection fraction, by visual estimation, is 20 to 25%. The left ventricle has severely decreased function. There is no left ventricular hypertrophy. 2. Left ventricular diastolic function could not be evaluated. 3. Mildly dilated left ventricular internal cavity size. 4. Global right ventricle has severely reduced systolic function.The right ventricular size is mildly enlarged. 5. Left atrial size was moderately dilated. 6. Right atrial size was severely dilated. 7. The mitral valve is abnormal. Mild to moderate mitral valve regurgitation. No evidence of mitral stenosis. 8. The tricuspid valve is normal in structure. Tricuspid valve regurgitation moderate. 9. The aortic valve is tricuspid. Aortic valve regurgitation is not visualized. Mild aortic valve sclerosis without stenosis. 10. The pulmonic valve was normal in structure. Pulmonic valve regurgitation is mild. 11. Moderately elevated pulmonary artery systolic pressure. 12. The inferior vena cava is dilated in size with <50% respiratory variability, suggesting right atrial pressure of 15 mmHg. 13. Severe global reduction in LV systolic function; 4 chamber enlargement; mild to moderate MR;  severely reduced RV function; moderate TR.   Review of Systems: [y] = yes, [ ]  = no    General: Weight gain [ ] ; Weight loss [ ] ; Anorexia [ ] ; Fatigue [ ] ; Fever [ ] ; Chills [ ] ;  Weakness [ ]    Cardiac: Chest pain/pressure [ ] ; Resting SOB [ ] ; Exertional SOB [ ] ; Orthopnea [ ] ; Pedal Edema [ ] ; Palpitations [ ] ; Syncope [ ] ; Presyncope [ ] ; Paroxysmal nocturnal dyspnea[ ]    Pulmonary: Cough [ ] ; Wheezing[ ] ; Hemoptysis[ ] ; Sputum [ ] ; Snoring [ ]    GI: Vomiting[ ] ; Dysphagia[ ] ; Melena[ ] ; Hematochezia [ ] ; Heartburn[ ] ; Abdominal pain [ ] ; Constipation [ ] ; Diarrhea [ ] ; BRBPR [ ]    GU: Hematuria[ ] ; Dysuria [ ] ; Nocturia[ ]    Vascular: Pain in legs with walking [ ] ; Pain in feet with lying flat [ ] ; Non-healing sores [ ] ; Stroke [ ] ; TIA [ ] ; Slurred speech [ ] ;   Neuro: Headaches[ ] ; Vertigo[ ] ; Seizures[ ] ; Paresthesias[ ] ;Blurred vision [ ] ; Diplopia [ ] ; Vision changes [ ]    Ortho/Skin: Arthritis [ ] ; Joint pain [ ] ; Muscle pain [ ] ; Joint swelling [ ] ; Back Pain [ ] ; Rash [ ]    Psych: Depression[ ] ; Anxiety[ ]    Heme: Bleeding problems [ ] ; Clotting disorders [ ] ; Anemia [ ]    Endocrine: Diabetes [ ] ; Thyroid dysfunction[ ]   Home Medications Prior to Admission medications   Medication Sig Start Date End Date Taking? Authorizing Provider  allopurinol (ZYLOPRIM) 300 MG tablet Take 300 mg by mouth daily.   Yes [provider]  apixaban (ELIQUIS) 5 MG TABS tablet Take 5 mg by mouth 2 (two) times daily.   Yes [provider]  atorvastatin (LIPITOR) 40 MG tablet Take 40 mg by mouth daily.   Yes [provider]  colchicine 0.6 MG tablet Take 0.6 mg by mouth as needed (for Gout).    Yes [provider]  furosemide (LASIX) 80 MG tablet Take 80 mg by mouth daily.    Yes [provider]  metoprolol succinate (TOPROL-XL) 100 MG 24 hr tablet Take 50 mg by mouth daily. Take with or immediately following a meal.    Yes [provider]  montelukast (SINGULAIR) 10 MG tablet Take 10 mg by mouth daily.   Yes [provider]    Past Medical History: Past Medical History:  Diagnosis Date   A-fib ([place])      Cardiomyopathy ([place])    CHF (congestive heart failure) ([place])    Edema    Hypertension    Pulmonary hypertension ([facility])    Sleep apnea    Tricuspid regurgitation     Past Surgical History: History reviewed. No pertinent surgical history.  Family History: Family History  Problem Relation Age of Onset   Seizures Father    CAD Father    Diabetes Sister    Lupus Sister    Hypertension Sister     Social History: Social History   Socioeconomic History   Marital status: Widowed    Spouse name: Not on file   Number of children: Not on file   Years of education: Not on file   Highest education level: Not on file  Occupational History   Not on file  Social Needs   Financial resource strain: Not on file   Food insecurity    Worry: Not on file    Inability: Not on file   Transportation needs  Medical: Not on file    Non-medical: Not on file  Tobacco Use   Smoking status: Never Smoker   Smokeless tobacco: Never Used  Substance and Sexual Activity   Alcohol use: Not Currently   Drug use: Not on file   Sexual activity: Not Currently  Lifestyle   Physical activity    Days per week: Not on file    Minutes per session: Not on file   Stress: Not on file  Relationships   Social connections    Talks on phone: Not on file    Gets together: Not on file    Attends religious service: Not on file    Active member of club or organization: Not on file    Attends meetings of clubs or organizations: Not on file    Relationship status: Not on file  Other Topics Concern   Not on file  Social History Narrative   Not on file    Allergies:  No Known Allergies  Objective:    Vital Signs:   Temp:  [97.4 F (36.3 C)-98 F (36.7 C)] 98 F (36.7 C) (11/17 1416) Pulse Rate:  [42-110] 51 (11/17 1416) Resp:  [18-26] 21 (11/17 1500) BP: (92-129)/(55-87) 92/77 (11/17 1500) SpO2:  [84 %-99 %] 96 % (11/17 1416) Weight:  [112.6 kg-113 kg] 112.6 kg  (11/17 0500) Last BM Date: (PTA)  Weight change: Filed Weights   10/11/19 2044 10/12/19 0500  Weight: 113 kg 112.6 kg    Intake/Output:   Intake/Output Summary (Last 24 hours) at 10/12/2019 1524 Last data filed at 10/12/2019 1400 Gross per 24 hour  Intake 80.88 ml  Output --  Net 80.88 ml      Physical Exam    General:  Well appearing. No resp difficulty HEENT: normal Neck: supple. JVP . Carotids 2+ bilat; no bruits. No lymphadenopathy or thyromegaly appreciated. Cor: PMI nondisplaced. irregularly irregular, tachy rate. No rubs, gallops or murmurs. Lungs: clear Abdomen: soft, nontender, nondistended. No hepatosplenomegaly. No bruits or masses. Good bowel sounds. Extremities: no cyanosis, clubbing, rash, edema Neuro: alert & orientedx3, cranial nerves grossly intact. moves all 4 extremities w/o difficulty. Affect pleasant   Telemetry   Appears to be in afib on tele w/ lots of ventricular ectopy/ PVCs. HR 120s.    EKG    Junctional rhythm w/ PVCs.   Labs   Basic Metabolic Panel: Recent Labs  Lab 10/11/19 1309 10/12/19 0213 10/12/19 0433  NA 137 137 136  K 4.1 3.7 3.9  CL 97* 98 97*  CO2 24 23 22   GLUCOSE 97 103* 105*  BUN 47* 48* 48*  CREATININE 1.91* 1.90* 1.97*  CALCIUM 9.5 9.4 9.2    Liver Function Tests: Recent Labs  Lab 10/12/19 0433  AST 68*  ALT 51*  ALKPHOS 181*  BILITOT 1.7*  PROT 6.5  ALBUMIN 3.6   No results for input(s): LIPASE, AMYLASE in the last 168 hours. No results for input(s): AMMONIA in the last 168 hours.  CBC: Recent Labs  Lab 10/11/19 1309 10/12/19 0431  WBC 6.2 5.3  NEUTROABS  --  3.2  HGB 9.7* 9.2*  HCT 33.4* 30.6*  MCV 81.5 78.7*  PLT 216 156    Cardiac Enzymes: No results for input(s): CKTOTAL, CKMB, CKMBINDEX, TROPONINI in the last 168 hours.  BNP: BNP (last 3 results) Recent Labs    10/11/19 1309  BNP 3,374.7*    ProBNP (last 3 results) No results for input(s): PROBNP in the last 8760  hours.  CBG: No results for input(s): GLUCAP in the last 168 hours.  Coagulation Studies: Recent Labs    10/12/19 0431  LABPROT 20.3*  INR 1.8*     Imaging    No results found.   Medications:     Current Medications:  allopurinol  300 mg Oral Daily   atorvastatin  40 mg Oral Daily   Chlorhexidine Gluconate Cloth  6 each Topical Daily   dexamethasone (DECADRON) injection  6 mg Intravenous Q24H   furosemide  80 mg Intravenous Daily   multivitamin with minerals  1 tablet Oral Daily   sodium chloride flush  3 mL Intravenous Q12H   zinc sulfate  220 mg Oral Daily     Infusions:  sodium chloride     heparin 1,100 Units/hr (10/12/19 1030)   [START ON 10/13/2019] remdesivir 100 mg in NS 250 mL         Patient Profile   73 y/o female, COVID +, lives in [place] and followed by cardiologist there for chronic systolic HF, mitral valve regurgitation and tricuspid valve regurgitation, who was scheduled to undergo both mitral and tricuspid valve replacement surgery on 11/18 in [place]. Also w/ h/o atrial fibrillation on chronic a/c w/ Eliquis, OSA on CPAP, Stage III CKD and HTN. Admitted for acute on chronic CHF + COVID infection.   Assessment/Plan   1. Acute on Chronic Systolic Heart Failure/ Biventricular HF: systolic HF not a new diagnosis. Previously diagnosed in [place]. Echocardiogram on August 05, 2019 noted an EF of 40 to 45% (admitting team obtained this by calling patient's cardiologist, Dr. Ervin Knack). Now admitted w/ NYHA Class III symptoms and volume overload w/ 22 lb wt gain over course of 2-3 wks, per pt report, and failure to respond to home diuretics. BNP 3,374 on admit. 2D Echo 11/17 w/ biventricular failure. LVEF 20-25%. RV systolic function severely reduced. LA moderately dilated. RA severely dilated. MR reported to be mild-moderate w/o stenosis. Moderate TV regurgitation noted. Pulmonary artery pressure moderately elevated at 45 mmHg.  - Continue IV  diuretics, Lasix 80 mg bid. Monitor UOP and check wt daily. Follow SCr.  - If poor response to IV diuretics, may need empiric trial of inotropic therapy w/ milrinone to help w/ diuresis and BiV failure - No ACE/ARB/ ARNI, Spiro nor digoxin due to AKI on CKD.   2. Valvular Disease: per H&P, pt has been followed by cardiology in [place] and was scheduled to undergo both mitral valve replacement and tricuspid valve replacement for severe MR and TR. This was scheduled for today in [place], however echocardiogram done here shows only mild to moderate mitral valve regurgitation. No evidence of mitral stenosis. TR moderate. - Will need treatment of a/c systolic CHF and management of acute COVID infection. Once medically stable and discharged, can f/u w/ cardiology/ CT surgery in [place].   3. COVID 19 Infection: 2 positive test. On supplemental O2 requirements ([place]). CXR w/o PNA. - remdesivir, dexamethasone, zinc and supportive care per IM   4. Atrial Fibrillation: Appears to be in afib on tele w/ lots of ventricular ectopy/ PVCs. HR 120s.   Per records, pt apparently on Cardizem and metoprolol PTA (held on admit due to bradycardia).  - currently remains off of metoprolol. Given severe biventricular dysfunction, would not recommend further use of Cardizem in the future  -Will start IV amiodarone for rapid afib and suppression of PVCs.  - Monitor electrolytes w/ diuresis. Keep K >4.0 and Mg >2.0 -Pt held Eliquis prior to  this admit in anticipation of valve surgery (see above). Now on IV heparin   5. Hyponatremia: likely 2/2 hypervolemia -Continue diuretics and monitor   6. AKI on CKD (Stage III): baseline SCr reported to be ~1.2. SCr 1.9 on admit. Renal US negative. Suspect likely due to decompensated HF/ volume overload. SCr slightly improved w/ diuretics, 1.97>>1.82.  - Continue IV diuretics and monitor UOP, SCr/BUN - With severe biventricular failure, may need treatment w/ milrinone to help w/ CO and  diuresis  - Nephrology also following  MD to follow w/ further recommendations.   Lyda Jester, PA-C  10/13/2019, 9:51 AM   Advanced Heart Failure Team Pager [phone] (M-F; 7a - 4p)  Please contact [facility] for night-coverage after hours (4p -7a ) and weekends on [email or web address]  Patient seen with PA, agree with the above.    She lives in [place] but has been staying in [place] with her daughter.  She sees a cardiologist in [place] and was planned for tricuspid and mitral valve replacements this week.  EF reportedly 40-45% on last echo in [place].  Presumably, she must have had a cardiac cath prior to planned surgery but I have no report of this.  She had 2-3 weeks of worsening dyspnea and significant weight gain.  She tried increasing her outpatient Lasix without success so came to the ER.  She was noted to be volume overloaded but also was COVID-19 positive.  CXR showed small bilateral effusions, no PNA.  She appeared to be in atrial fibrillation. She says that she has known about the atrial fibrillation and was anticoagulated.  However, not sure if afib is persistent or paroxysmal. She says that she has had a cardioversion before.    Currently, in atrial fibrillation with mild RVR in 110s with heparin gtt.  Creatinine elevated at 1.82 today, was 1.9 at admission.  She has been on IV Lasix without much response though not sure I/Os being fully recorded.    I reviewed her echo, EF appears 20-25% with dilated and severely dysfunctional RV with D-shaped interventricular septum.  MR appears moderate and TR appears moderate.    General: NAD Neck: JVP difficult, probably around 12, no thyromegaly or thyroid nodule.  Lungs: Decreased at bases.  CV: Lateral PMI.  Heart irregular S1/S2, no S3/S4, no murmur.  1+ ankle edema.  No carotid bruit.  Normal pedal pulses.  Abdomen: Soft, nontender, no hepatosplenomegaly, no distention.  Skin: Intact without lesions or rashes.  Neurologic:  Alert and oriented x 3.  Psych: Normal affect. Extremities: No clubbing or cyanosis.  HEENT: Normal.   1. Acute on chronic systolic CHF: Echo with EF 20-25% with dilated and severely dysfunctional RV with D-shaped interventricular septum.  MR appears moderate and TR appears moderate.  With dilated RV, V/Q scan was done and was negative.  Biventricular failure.  Last echo in [place] per report showed EF 40-45%.  Cause of fall in EF uncertain, could be due to atrial fibrillation with RVR (tachycardia-mediated), unsure if she is always in atrial fibrillation or if it is paroxysmal.  Myocarditis, potentially from COVID-19 is possible.  It is also possible that she has a stress cardiomyopathy due to medical illness/COVID-19 infection. Presumably, she would have had a cardiac cath prior to her planned MV/TV surgery but this is not available to me.  Creatinine is 1.8 today, she has not been diuresing particularly well and BP is soft.  I am concerned that she could have low output HF. I think  that she is still volume overloaded.  - Place PICC line to follow CVP and co-ox.  - Continue Lasix 80 mg IV bid for now.  - I will start her on milrinone 0.25 mcg/kg/min empirically for now.  - Need details from her cardiologist in [place] => did she have recent coronary angiography? Is she always in atrial fibrillation or is it paroxysmal? - Control HF on amiodarone while on milrinone.  2. Valvular heart disease: Patient was supposed to have mitral and tricuspid valve replacements this week.  On my read of the echo done here, she has moderate MR and moderate TR (both central and likely functional).  Her main issue here seems to be biventricular failure and would hold off on valve procedures.  3. Atrial fibrillation: Uncertain if paroxysmal or long-standing.  She appears to be in atrial fibrillation today with PVCs.  - Confirm rhythm with ECG.  - Continue heparin gtt for now.  - Amiodarone gtt for rate control, ?rhythm  control eventually.  - Will try to get records from cardiologist in [place] about how chronic her afib is.  4. COVID-19 infection: No PNA on CXR.  On supplemental O2 but this may be due to CHF.  - Getting remdesivir and dexamethasone.  5. AKI: Per report, baseline creatinine around 1.2, now up to 1.8.  Possibly due to low output.  See plan with milrinone above.   [name] 10/13/2019 2:20 PM

## 2019-10-12 NOTE — Progress Notes (Signed)
Talked to pt about wearing CPAP tonight and she agreed to try it. Pt was taking it off as I was trying to put her on it. She stated "oh lawd a cant wear that thing" Talked to RN, advised RN to call this RT if she changed her mind,

## 2019-10-12 NOTE — Plan of Care (Signed)
  Problem: Education: Goal: Knowledge of General Education information will improve Description: Including pain rating scale, medication(s)/side effects and non-pharmacologic comfort measures 10/12/2019 1325 by Shanon Ace, RN Outcome: Progressing 10/12/2019 1325 by Shanon Ace, RN Outcome: Progressing   Problem: Health Behavior/Discharge Planning: Goal: Ability to manage health-related needs will improve 10/12/2019 1325 by Shanon Ace, RN Outcome: Progressing 10/12/2019 1325 by Shanon Ace, RN Outcome: Progressing   Problem: Clinical Measurements: Goal: Ability to maintain clinical measurements within normal limits will improve 10/12/2019 1325 by Shanon Ace, RN Outcome: Progressing 10/12/2019 1325 by Shanon Ace, RN Outcome: Progressing Goal: Will remain free from infection 10/12/2019 1325 by Shanon Ace, RN Outcome: Progressing 10/12/2019 1325 by Shanon Ace, RN Outcome: Progressing Goal: Diagnostic test results will improve 10/12/2019 1325 by Shanon Ace, RN Outcome: Progressing 10/12/2019 1325 by Shanon Ace, RN Outcome: Progressing Goal: Respiratory complications will improve 10/12/2019 1325 by Shanon Ace, RN Outcome: Progressing 10/12/2019 1325 by Shanon Ace, RN Outcome: Progressing Goal: Cardiovascular complication will be avoided 10/12/2019 1325 by Shanon Ace, RN Outcome: Progressing 10/12/2019 1325 by Shanon Ace, RN Outcome: Progressing   Problem: Activity: Goal: Risk for activity intolerance will decrease 10/12/2019 1325 by Shanon Ace, RN Outcome: Progressing 10/12/2019 1325 by Shanon Ace, RN Outcome: Progressing   Problem: Nutrition: Goal: Adequate nutrition will be maintained 10/12/2019 1325 by Shanon Ace, RN Outcome: Progressing 10/12/2019 1325 by Shanon Ace, RN Outcome: Progressing   Problem: Coping: Goal: Level of anxiety will decrease 10/12/2019 1325 by Shanon Ace, RN Outcome:  Progressing 10/12/2019 1325 by Shanon Ace, RN Outcome: Progressing   Problem: Elimination: Goal: Will not experience complications related to bowel motility 10/12/2019 1325 by Shanon Ace, RN Outcome: Progressing 10/12/2019 1325 by Shanon Ace, RN Outcome: Progressing Goal: Will not experience complications related to urinary retention 10/12/2019 1325 by Shanon Ace, RN Outcome: Progressing 10/12/2019 1325 by Shanon Ace, RN Outcome: Progressing   Problem: Pain Managment: Goal: General experience of comfort will improve 10/12/2019 1325 by Shanon Ace, RN Outcome: Progressing 10/12/2019 1325 by Shanon Ace, RN Outcome: Progressing   Problem: Safety: Goal: Ability to remain free from injury will improve 10/12/2019 1325 by Shanon Ace, RN Outcome: Progressing 10/12/2019 1325 by Shanon Ace, RN Outcome: Progressing   Problem: Skin Integrity: Goal: Risk for impaired skin integrity will decrease 10/12/2019 1325 by Shanon Ace, RN Outcome: Progressing 10/12/2019 1325 by Shanon Ace, RN Outcome: Progressing   Problem: Education: Goal: Knowledge of risk factors and measures for prevention of condition will improve Outcome: Progressing   Problem: Coping: Goal: Psychosocial and spiritual needs will be supported Outcome: Progressing   Problem: Respiratory: Goal: Will maintain a patent airway Outcome: Progressing Goal: Complications related to the disease process, condition or treatment will be avoided or minimized Outcome: Progressing

## 2019-10-12 NOTE — Progress Notes (Addendum)
PROGRESS NOTE    Melissa Adams  CVE:[id number] DOB: 1946/01/09 DOA: 10/11/2019 PCP: System, Provider Not In   Brief Narrative:  73 year old female with history of heart failure, atrial fibrillation, hypertension, sleep apnea who presented with complaints of shortness of breath and weight gain.  Patient found to have an elevated BNP in the emergency department along with shortness of breath.  She was given IV Lasix.  Cardiology was consulted.  Of note patient found to be COVID +.  Assessment & Plan   COVID positive -Covid tested returned positive -Patient does not seem to be symptomatic from Covid at this time however she does have shortness of breath which likely is related to her CHF exacerbation -Covid markers such as ferritin and CRP were unremarkable.  However LDH was elevated as well as D-dimer. -She has been placed on precautions, airborne and contact. -Patient also given remdesivir, placed on Decadron and zinc.  Acute systolic CHF exacerbation -Presented with dyspnea on exertion, 22 pound weight gain since September 20, 2019, lower extremity edema -Dry weight is approximately 226 pounds; on admission 249 pounds -BNP was noted to be 3374 on admission.  Chest x-ray did show small right pleural effusion. -On examination patient was hypervolemic with lower extremity edema. -BNP 3374.7 -Echocardiogram on August 05, 2019 noted an EF of 40 to 45%(obtain this by calling patient's cardiologist, Dr. Ervin Knack) -patient was given IV Lasix-no urine output was noted -Will continue to give IV Lasix and monitor intake and output, daily weights -Echocardiogram ordered and pending  Acute kidney injury -Creatinine on admission 1.91, now up to 1.97 after receiving 2 doses of IV Lasix -May possibly be secondary to Lasix use prior to admission or Covid -Patient's creatinine August 05, 2019 was 1.2 -Nephrology consulted and appreciated, discussed with Dr. Hollie Salk.  Recommended continuing  IV Lasix given that there was a small insignificant bump in her creatinine and continue to monitor closely. -Will obtain renal ultrasound, urine creatinine, sodium and osm -Monitor BMP  Elevated D-dimer -possibly secondary to COVID vs PE -Unfortunately cannot obtain CTA of the chest given creatinine.  Will obtain VQ scan if possible -Will also obtain lower ext doppler -Will place patient on heparin.  Of note she was on Eliquis for atrial fibrillation and last dose was on 10/10/2019. If she does have a blood clot, she would need a higher dose of Eliquis.  Normocytic anemia -Hemoglobin currently 9.2, unknown baseline -Continue to monitor CBC  Atrial fibrillation -Patient currently in atrial fibrillation -Eliquis was held as she was supposed to have surgery on 10/13/2019 for mitral and tricuspid valve replacement -Will hold Cardizem and metoprolol at this time as patient's heart rate is bradycardic in the 34s  Essential hypertension -Metoprolol, Lasix held [facility] was noted on her medication list however per daughter she has not been taking this  Sleep apnea -Patient states that she does use a CPAP however has not used it recently as it is broken and is being replaced currently -Given her Covid positive result, will discontinue CPAP  Tricuspid regurgitation -As above, patient is scheduled to have a mitral and tricuspid valve replacement on 10/13/2019 in [place]  Gout -Continue allopurinol -Colchicine held  DVT Prophylaxis Heparin  Code Status: Full  Family Communication: None at bedside  Disposition Plan: Admitted.  Pending improvement of respiratory status as well as creatinine.  Patient does require inpatient hospitalization given her Covid positive status in the setting of CHF and acute kidney injury.  Consultants Cardiology Nephrology  Procedures  None  Antibiotics   Anti-infectives (From admission, onward)   Start     Dose/Rate Route Frequency  Ordered Stop   10/13/19 1000  remdesivir 100 mg in sodium chloride 0.9 % 250 mL IVPB     100 mg 500 mL/hr over 30 Minutes Intravenous Every 24 hours 10/12/19 0600 10/17/19 0959   10/12/19 0700  remdesivir 200 mg in sodium chloride 0.9 % 250 mL IVPB     200 mg 500 mL/hr over 30 Minutes Intravenous Once 10/12/19 0600 10/12/19 0908      Subjective:   Melissa Adams seen and examined today.  Patient with continued shortness of breath and feeling very tired this morning.  Denies current chest pain, abdominal pain, nausea or vomiting, diarrhea or constipation, dizziness or headache.  Complains of all the pain she is experienced at her IV sites.  Objective:   Vitals:   10/11/19 2344 10/11/19 2353 10/12/19 0500 10/12/19 0826  BP: 116/66   109/69  Pulse: (!) 57   90  Resp: 19 19  20   Temp: (!) 97.5 F (36.4 C)   97.6 F (36.4 C)  TempSrc: Oral   Oral  SpO2: 99%   96%  Weight:   112.6 kg   Height:        Intake/Output Summary (Last 24 hours) at 10/12/2019 0914 Last data filed at 10/12/2019 7342 Gross per 24 hour  Intake 3 ml  Output -  Net 3 ml   Filed Weights   10/11/19 2044 10/12/19 0500  Weight: 113 kg 112.6 kg    Exam  General: Well developed, chronically ill-appearing, NAD  HEENT: NCAT, mucous membranes moist.   Neck: Supple, +JVD  Cardiovascular: S1 S2 auscultated, soft SEM, distant heart sounds  Respiratory: Diminished breath sounds, mild exp wheezing   Abdomen: Soft, obese, nontender, nondistended, + bowel sounds  Extremities: warm dry without cyanosis clubbing, ++LE edema  Neuro: AAOx3, nonfocal  Psych: Normal affect and demeanor    Data Reviewed: I have personally reviewed following labs and imaging studies  CBC: Recent Labs  Lab 10/11/19 1309 10/12/19 0431  WBC 6.2 5.3  NEUTROABS  --  3.2  HGB 9.7* 9.2*  HCT 33.4* 30.6*  MCV 81.5 78.7*  PLT 216 876   Basic Metabolic Panel: Recent Labs  Lab 10/11/19 1309 10/12/19 0213 10/12/19 0433   NA 137 137 136  K 4.1 3.7 3.9  CL 97* 98 97*  CO2 24 23 22   GLUCOSE 97 103* 105*  BUN 47* 48* 48*  CREATININE 1.91* 1.90* 1.97*  CALCIUM 9.5 9.4 9.2   GFR: Estimated Creatinine Clearance: 31.2 mL/min (A) (by C-G formula based on SCr of 1.97 mg/dL (H)). Liver Function Tests: Recent Labs  Lab 10/12/19 0433  AST 68*  ALT 51*  ALKPHOS 181*  BILITOT 1.7*  PROT 6.5  ALBUMIN 3.6   No results for input(s): LIPASE, AMYLASE in the last 168 hours. No results for input(s): AMMONIA in the last 168 hours. Coagulation Profile: Recent Labs  Lab 10/12/19 0431  INR 1.8*   Cardiac Enzymes: No results for input(s): CKTOTAL, CKMB, CKMBINDEX, TROPONINI in the last 168 hours. BNP (last 3 results) No results for input(s): PROBNP in the last 8760 hours. HbA1C: No results for input(s): HGBA1C in the last 72 hours. CBG: No results for input(s): GLUCAP in the last 168 hours. Lipid Profile: No results for input(s): CHOL, HDL, LDLCALC, TRIG, CHOLHDL, LDLDIRECT in the last 72 hours. Thyroid Function Tests: No results for input(s):  TSH, T4TOTAL, FREET4, T3FREE, THYROIDAB in the last 72 hours. Anemia Panel: Recent Labs    10/12/19 0431  FERRITIN 7*   Urine analysis:    Component Value Date/Time   LABSPEC 1.015 06/09/2019 1735   PHURINE 6.0 06/09/2019 1735   GLUCOSEU NEGATIVE 06/09/2019 1735   HGBUR MODERATE (A) 06/09/2019 1735   BILIRUBINUR NEGATIVE 06/09/2019 1735   KETONESUR NEGATIVE 06/09/2019 1735   PROTEINUR NEGATIVE 06/09/2019 1735   UROBILINOGEN 0.2 06/09/2019 1735   NITRITE NEGATIVE 06/09/2019 1735   LEUKOCYTESUR SMALL (A) 06/09/2019 1735   Sepsis Labs: @LABRCNTIP (procalcitonin:4,lacticidven:4)  ) Recent Results (from the past 240 hour(s))  SARS CORONAVIRUS 2 (TAT 6-24 HRS) Nasopharyngeal Nasopharyngeal Swab     Status: Abnormal   Collection Time: 10/11/19  4:17 PM   Specimen: Nasopharyngeal Swab  Result Value Ref Range Status   SARS Coronavirus 2 POSITIVE (A) NEGATIVE  Final    Comment: RESULT CALLED TO, READ BACK BY AND VERIFIED WITH: A.KIRBY,RN 0215 10/12/19 G.MCADOO (NOTE) SARS-CoV-2 target nucleic acids are DETECTED. The SARS-CoV-2 RNA is generally detectable in upper and lower respiratory specimens during the acute phase of infection. Positive results are indicative of active infection with SARS-CoV-2. Clinical  correlation with patient history and other diagnostic information is necessary to determine patient infection status. Positive results do  not rule out bacterial infection or co-infection with other viruses. The expected result is Negative. Fact Sheet for Patients: SugarRoll.be Fact Sheet for Healthcare Providers: https://www.woods-mathews.com/ This test is not yet approved or cleared by the [place] FDA and  has been authorized for detection and/or diagnosis of SARS-CoV-2 by FDA under an Emergency Use Authorization (EUA). This EUA will remain  in effect (meaning this test can be used) for the  duration of the COVID-19 declaration under Section 564(b)(1) of the Act, 21 U.S.C. section 360bbb-3(b)(1), unless the authorization is terminated or revoked sooner. Performed at [facility], [place] [address], [place], [place] [address]   MRSA PCR Screening     Status: None   Collection Time: 10/11/19  8:44 PM   Specimen: Nasopharyngeal  Result Value Ref Range Status   MRSA by PCR NEGATIVE NEGATIVE Final    Comment:        The GeneXpert MRSA Assay (FDA approved for NASAL specimens only), is one component of a comprehensive MRSA colonization surveillance program. It is not intended to diagnose MRSA infection nor to guide or monitor treatment for MRSA infections. Performed at [facility], [place] [address] [place], [place] [address]       Radiology Studies: Dg Chest 2 View  Result Date: 10/11/2019 CLINICAL DATA:  Shortness of breath EXAM: CHEST - 2 VIEW COMPARISON:  None  FINDINGS: There is a small right pleural effusion. There is mild bibasilar atelectasis. There is no edema or consolidation. There is cardiomegaly with pulmonary vascularity within normal limits. No adenopathy. There is mild anterior wedging of an upper thoracic vertebral body. IMPRESSION: Small right pleural effusion with bibasilar atelectasis. No edema or consolidation. There is cardiomegaly.  No evident adenopathy. Electronically Signed   By: Lowella Grip III M.D.   On: 10/11/2019 12:59     Scheduled Meds: . allopurinol  300 mg Oral Daily  . atorvastatin  40 mg Oral Daily  . dexamethasone (DECADRON) injection  6 mg Intravenous Q24H  . furosemide  80 mg Intravenous Daily  . heparin  5,000 Units Subcutaneous Q8H  . multivitamin with minerals  1 tablet Oral Daily  . sodium chloride  flush  3 mL Intravenous Q12H  . zinc sulfate  220 mg Oral Daily   Continuous Infusions: . sodium chloride    . [START ON 10/13/2019] remdesivir 100 mg in NS 250 mL       LOS: 0 days   Time Spent in minutes   30 minutes  Malic Rosten D.O. on 10/12/2019 at 9:14 AM  Between 7am to 7pm - Please see pager noted on [email or web address]  After 7pm go to [email or web address]  And look for the night coverage person covering for me after hours  [facility] Office  [phone]

## 2019-10-12 NOTE — Progress Notes (Addendum)
ANTICOAGULATION CONSULT NOTE - Initial Consult  Pharmacy Consult for heparin Indication: atrial fibrillation  No Known Allergies  Patient Measurements: Height: 5\' 3"  (160 cm) Weight: 248 lb 3.8 oz (112.6 kg) IBW/kg (Calculated) : 52.4 Heparin Dosing Weight: 79.8 kg  Vital Signs: Temp: 97.6 F (36.4 C) (11/17 0826) Temp Source: Oral (11/17 0826) BP: 109/69 (11/17 0826) Pulse Rate: 90 (11/17 0826)  Labs: Recent Labs    10/11/19 1309 10/11/19 1616 10/12/19 0213 10/12/19 0431 10/12/19 0433 10/12/19 0500  HGB 9.7*  --   --  9.2*  --   --   HCT 33.4*  --   --  30.6*  --   --   PLT 216  --   --  156  --   --   LABPROT  --   --   --  20.3*  --   --   INR  --   --   --  1.8*  --   --   CREATININE 1.91*  --  1.90*  --  1.97*  --   TROPONINIHS 30* 33*  --  27*  --  31*    Estimated Creatinine Clearance: 31.2 mL/min (A) (by C-G formula based on SCr of 1.97 mg/dL (H)).   Medical History: Past Medical History:  Diagnosis Date  . A-fib ([place])   . Cardiomyopathy ([place])   . CHF (congestive heart failure) ([place])   . Edema   . Hypertension   . Pulmonary hypertension ([place])   . Sleep apnea   . Tricuspid regurgitation     Medications:  Scheduled:  . allopurinol  300 mg Oral Daily  . atorvastatin  40 mg Oral Daily  . dexamethasone (DECADRON) injection  6 mg Intravenous Q24H  . furosemide  80 mg Intravenous Daily  . multivitamin with minerals  1 tablet Oral Daily  . sodium chloride flush  3 mL Intravenous Q12H  . zinc sulfate  220 mg Oral Daily    Assessment: 53 yof presenting with SOB and weight gain - on apixaban PTA, has been on hold for mitral/tricupsid valve replacement scheduled for 11/18 (last dose on 11/8). Also found to be COVID +.   Hgb 9.2, plt 156. D-dimer 12.41, fibrinogen 253, LDH 327. No s/sx of bleeding. Last SQH for DVT ppx on 11/17@0455 .   Goal of Therapy:  Heparin level 0.3-0.7 units/ml Monitor platelets by anticoagulation protocol: Yes   Plan:  Give  3000 units bolus x 1 given recent SQH dosing  Start heparin infusion at 1100 units/hr Check anti-Xa level in 8 hours and daily while on heparin Continue to monitor H&H and platelets  Antonietta Jewel, PharmD, BCCCP Clinical Pharmacist  Phone: [phone]  Please check AMION for all [place] phone numbers After 10:00 PM, call [place] [phone] 10/12/2019,9:28 AM

## 2019-10-12 NOTE — Progress Notes (Signed)
  Echocardiogram 2D Echocardiogram has been performed.  Melissa Adams Melissa Adams 10/12/2019, 12:38 PM

## 2019-10-13 ENCOUNTER — Other Ambulatory Visit: Payer: Self-pay

## 2019-10-13 ENCOUNTER — Inpatient Hospital Stay: Payer: Self-pay

## 2019-10-13 ENCOUNTER — Inpatient Hospital Stay (HOSPITAL_COMMUNITY): Payer: Medicare Other

## 2019-10-13 DIAGNOSIS — R7989 Other specified abnormal findings of blood chemistry: Secondary | ICD-10-CM | POA: Diagnosis not present

## 2019-10-13 DIAGNOSIS — I5043 Acute on chronic combined systolic (congestive) and diastolic (congestive) heart failure: Secondary | ICD-10-CM

## 2019-10-13 DIAGNOSIS — N179 Acute kidney failure, unspecified: Secondary | ICD-10-CM | POA: Diagnosis not present

## 2019-10-13 DIAGNOSIS — I482 Chronic atrial fibrillation, unspecified: Secondary | ICD-10-CM

## 2019-10-13 LAB — CBC
HCT: 29.6 % — ABNORMAL LOW (ref 36.0–46.0)
Hemoglobin: 8.9 g/dL — ABNORMAL LOW (ref 12.0–15.0)
MCH: 23.7 pg — ABNORMAL LOW (ref 26.0–34.0)
MCHC: 30.1 g/dL (ref 30.0–36.0)
MCV: 78.7 fL — ABNORMAL LOW (ref 80.0–100.0)
Platelets: 196 10*3/uL (ref 150–400)
RBC: 3.76 MIL/uL — ABNORMAL LOW (ref 3.87–5.11)
RDW: 17.3 % — ABNORMAL HIGH (ref 11.5–15.5)
WBC: 4.2 10*3/uL (ref 4.0–10.5)
nRBC: 0 % (ref 0.0–0.2)

## 2019-10-13 LAB — HEPARIN LEVEL (UNFRACTIONATED): Heparin Unfractionated: 2.12 IU/mL — ABNORMAL HIGH (ref 0.30–0.70)

## 2019-10-13 LAB — COMPREHENSIVE METABOLIC PANEL
ALT: 61 U/L — ABNORMAL HIGH (ref 0–44)
AST: 78 U/L — ABNORMAL HIGH (ref 15–41)
Albumin: 3.5 g/dL (ref 3.5–5.0)
Alkaline Phosphatase: 192 U/L — ABNORMAL HIGH (ref 38–126)
Anion gap: 16 — ABNORMAL HIGH (ref 5–15)
BUN: 50 mg/dL — ABNORMAL HIGH (ref 8–23)
CO2: 23 mmol/L (ref 22–32)
Calcium: 9.5 mg/dL (ref 8.9–10.3)
Chloride: 94 mmol/L — ABNORMAL LOW (ref 98–111)
Creatinine, Ser: 1.82 mg/dL — ABNORMAL HIGH (ref 0.44–1.00)
GFR calc Af Amer: 32 mL/min — ABNORMAL LOW (ref >60)
GFR calc non Af Amer: 27 mL/min — ABNORMAL LOW (ref >60)
Glucose, Bld: 118 mg/dL — ABNORMAL HIGH (ref 70–99)
Potassium: 4.2 mmol/L (ref 3.5–5.1)
Sodium: 133 mmol/L — ABNORMAL LOW (ref 135–145)
Total Bilirubin: 2.2 mg/dL — ABNORMAL HIGH (ref 0.3–1.2)
Total Protein: 6.8 g/dL (ref 6.5–8.1)

## 2019-10-13 LAB — APTT
aPTT: >200 seconds (ref 24–36)
aPTT: 154 seconds — ABNORMAL HIGH (ref 24–36)

## 2019-10-13 LAB — D-DIMER, QUANTITATIVE: D-Dimer, Quant: 11.89 ug/mL-FEU — ABNORMAL HIGH (ref 0.00–0.50)

## 2019-10-13 LAB — C-REACTIVE PROTEIN: CRP: <0.8 mg/dL (ref <1.0)

## 2019-10-13 MED ORDER — VITAMIN C 500 MG PO TABS
500.0000 mg | ORAL_TABLET | Freq: Every day | ORAL | Status: DC
  Administered 2019-10-14 – 2019-11-05 (×22): 500 mg via ORAL
  Filled 2019-10-13 (×23): qty 1

## 2019-10-13 MED ORDER — AMIODARONE HCL IN DEXTROSE 360-4.14 MG/200ML-% IV SOLN
60.0000 mg/h | INTRAVENOUS | Status: DC
  Administered 2019-10-13: 60 mg/h via INTRAVENOUS
  Filled 2019-10-13: qty 200

## 2019-10-13 MED ORDER — HEPARIN (PORCINE) 25000 UT/250ML-% IV SOLN
1100.0000 [IU]/h | INTRAVENOUS | Status: DC
  Administered 2019-10-13: 800 [IU]/h via INTRAVENOUS
  Administered 2019-10-14: 650 [IU]/h via INTRAVENOUS
  Administered 2019-10-16: 600 [IU]/h via INTRAVENOUS
  Administered 2019-10-17: 650 [IU]/h via INTRAVENOUS
  Administered 2019-10-19 – 2019-10-20 (×2): 900 [IU]/h via INTRAVENOUS
  Administered 2019-10-21 – 2019-10-24 (×4): 850 [IU]/h via INTRAVENOUS
  Administered 2019-10-26: 900 [IU]/h via INTRAVENOUS
  Administered 2019-10-27 – 2019-11-02 (×8): 1050 [IU]/h via INTRAVENOUS
  Filled 2019-10-13 (×20): qty 250

## 2019-10-13 MED ORDER — AMIODARONE LOAD VIA INFUSION
150.0000 mg | Freq: Once | INTRAVENOUS | Status: AC
  Administered 2019-10-13: 150 mg via INTRAVENOUS
  Filled 2019-10-13: qty 83.34

## 2019-10-13 MED ORDER — MILRINONE LACTATE IN DEXTROSE 20-5 MG/100ML-% IV SOLN
0.1250 ug/kg/min | INTRAVENOUS | Status: DC
  Administered 2019-10-13 – 2019-10-22 (×19): 0.25 ug/kg/min via INTRAVENOUS
  Administered 2019-10-22 – 2019-10-24 (×4): 0.125 ug/kg/min via INTRAVENOUS
  Filled 2019-10-13 (×28): qty 100

## 2019-10-13 MED ORDER — AMIODARONE HCL IN DEXTROSE 360-4.14 MG/200ML-% IV SOLN
30.0000 mg/h | INTRAVENOUS | Status: DC
  Administered 2019-10-13: 30 mg/h via INTRAVENOUS
  Administered 2019-10-14: 60 mg/h via INTRAVENOUS
  Administered 2019-10-14: 30 mg/h via INTRAVENOUS
  Administered 2019-10-15 – 2019-10-20 (×22): 60 mg/h via INTRAVENOUS
  Administered 2019-10-21: 30 mg/h via INTRAVENOUS
  Administered 2019-10-21: 60 mg/h via INTRAVENOUS
  Administered 2019-10-22 – 2019-10-26 (×11): 30 mg/h via INTRAVENOUS
  Filled 2019-10-13 (×44): qty 200

## 2019-10-13 NOTE — Progress Notes (Signed)
[facility] Progress Note    Assessment/ Plan:   1.  AKI on CKD 3: baseline Cr reportedly 1.2.  Likely d/t decompensated CHF.  Possible that COVID AKI could be playing into this but remains to be seen.  UA with 6-10 RBCs and no WBCs, UP/C 0.2, not really reflective of GN.  Agree with IV Lasix 80 BID--> continue to follow.  Renal US neg.  Continue diuresis--> CHF note reviewed, agree with possible need for inotropes for potential low output state.  Will follow.    2.  Severe biventricular CHF: was due for MV and TV replacement in [place] on 11/18.  TTE performed today.  Pressures soft, metop held  3.  COVID + : on supplemental O2 on hep gtt, V/Q scan ordered, getting remdesivir  4.  Afib; was on metop and eliquis as OP  5.  Dispo: 2W  Subjective:    Cr down to 1.82.  Not a lot of urine output charted.  CHF consulted.      Objective:   BP 115/69 (BP Location: Left Arm)   Pulse 88   Temp 97.9 F (36.6 C) (Oral)   Resp 16   Ht 5\' 3"  (1.6 m)   Wt 114.5 kg   SpO2 96%   BMI 44.72 kg/m   Intake/Output Summary (Last 24 hours) at 10/13/2019 1131 Last data filed at 10/13/2019 0815 Gross per 24 hour  Intake 134.88 ml  Output 1400 ml  Net -1265.12 ml   Weight change: 1.5 kg  Physical Exam:  Pt not seen in effort to preserve PPE  Imaging: Dg Chest 2 View  Result Date: 10/11/2019 CLINICAL DATA:  Shortness of breath EXAM: CHEST - 2 VIEW COMPARISON:  None FINDINGS: There is a small right pleural effusion. There is mild bibasilar atelectasis. There is no edema or consolidation. There is cardiomegaly with pulmonary vascularity within normal limits. No adenopathy. There is mild anterior wedging of an upper thoracic vertebral body. IMPRESSION: Small right pleural effusion with bibasilar atelectasis. No edema or consolidation. There is cardiomegaly.  No evident adenopathy. Electronically Signed   By: Lowella Grip III M.D.   On: 10/11/2019 12:59   Nm Pulmonary  Perfusion  Result Date: 10/12/2019 CLINICAL DATA:  Short of breath.  Heart failure.  COVID positive. EXAM: NUCLEAR MEDICINE PERFUSION LUNG SCAN TECHNIQUE: Perfusion images were obtained in multiple projections after intravenous injection of radiopharmaceutical. RADIOPHARMACEUTICALS:  1.5 mCi Tc-61m MAA COMPARISON:  Chest radiograph 10/11/2019 FINDINGS: No wedge-shaped peripheral perfusion defects within LEFT or RIGHT lung to suggest acute pulmonary embolism. Cardiomegaly. IMPRESSION: No evidence of acute pulmonary embolism. Electronically Signed   By: Suzy Bouchard M.D.   On: 10/12/2019 16:43   US Renal  Result Date: 10/12/2019 CLINICAL DATA:  Acute kidney injury EXAM: RENAL / URINARY TRACT ULTRASOUND COMPLETE COMPARISON:  None. FINDINGS: Right Kidney: Renal measurements: 10 x 4.9 x 5.3 cm = volume: 134.4 mL . Echogenicity within normal limits. No mass or hydronephrosis visualized. Left Kidney: Renal measurements: 9.6 x 5.2 by 3.5 cm = volume: 91.7 mL. Echogenicity within normal limits. No mass or hydronephrosis visualized. Trace left perinephric fluid. Bladder: Appears normal for degree of bladder distention. Other: Incidental note made of right pleural effusion IMPRESSION: 1. Negative renal ultrasound.  No hydronephrosis 2. Incidental note made of right pleural effusion Electronically Signed   By: Donavan Foil M.D.   On: 10/12/2019 15:48   Dg Chest Port 1 View  Result Date: 10/12/2019 CLINICAL DATA:  Status post  VQ scan, right chest discomfort, COVID positive EXAM: PORTABLE CHEST 1 VIEW COMPARISON:  10/11/2019 FINDINGS: Cardiomegaly. Possible small layering pleural effusions. The visualized skeletal structures are unremarkable. IMPRESSION: No significant change in AP portable examination. Cardiomegaly with possible small layering pleural effusions. No acute appearing airspace opacity. Electronically Signed   By: Eddie Candle M.D.   On: 10/12/2019 17:27    Labs: BMET Recent Labs  Lab  10/11/19 1309 10/12/19 0213 10/12/19 0433 10/13/19 0335  NA 137 137 136 133*  K 4.1 3.7 3.9 4.2  CL 97* 98 97* 94*  CO2 24 23 22 23   GLUCOSE 97 103* 105* 118*  BUN 47* 48* 48* 50*  CREATININE 1.91* 1.90* 1.97* 1.82*  CALCIUM 9.5 9.4 9.2 9.5   CBC Recent Labs  Lab 10/11/19 1309 10/12/19 0431 10/13/19 0335  WBC 6.2 5.3 4.2  NEUTROABS  --  3.2  --   HGB 9.7* 9.2* 8.9*  HCT 33.4* 30.6* 29.6*  MCV 81.5 78.7* 78.7*  PLT 216 156 196    Medications:    . allopurinol  300 mg Oral Daily  . amiodarone  150 mg Intravenous Once  . atorvastatin  40 mg Oral Daily  . Chlorhexidine Gluconate Cloth  6 each Topical Daily  . dexamethasone (DECADRON) injection  6 mg Intravenous Q24H  . furosemide  80 mg Intravenous BID  . multivitamin with minerals  1 tablet Oral Daily  . sodium chloride flush  3 mL Intravenous Q12H  . zinc sulfate  220 mg Oral Daily      Madelon Lips, MD 10/13/2019, 11:31 AM

## 2019-10-13 NOTE — Progress Notes (Signed)
Awaiting approval from nephrology to place PICC.  Dr. Hollie Salk securechat sent at [phone] with no response.  Contacted Gladys,RN regarding need for approval, Regino Schultze to page nephrology.  At this time, still no approval noted, spoke with Suezanne Jacquet, RN charge regarding need for approval before PICC can be placed.  He states that he will follow up.  Will continue to monitor.

## 2019-10-13 NOTE — Plan of Care (Signed)
  Problem: Education: Goal: Knowledge of General Education information will improve Description: Including pain rating scale, medication(s)/side effects and non-pharmacologic comfort measures Outcome: Progressing   Problem: Health Behavior/Discharge Planning: Goal: Ability to manage health-related needs will improve Outcome: Progressing   Problem: Clinical Measurements: Goal: Ability to maintain clinical measurements within normal limits will improve Outcome: Progressing Goal: Will remain free from infection Outcome: Progressing Goal: Diagnostic test results will improve Outcome: Progressing Goal: Respiratory complications will improve Outcome: Progressing Goal: Cardiovascular complication will be avoided Outcome: Progressing   Problem: Activity: Goal: Risk for activity intolerance will decrease Outcome: Progressing   Problem: Nutrition: Goal: Adequate nutrition will be maintained Outcome: Progressing   Problem: Coping: Goal: Level of anxiety will decrease Outcome: Progressing   Problem: Elimination: Goal: Will not experience complications related to bowel motility Outcome: Progressing Goal: Will not experience complications related to urinary retention Outcome: Progressing   Problem: Skin Integrity: Goal: Risk for impaired skin integrity will decrease Outcome: Progressing   Problem: Education: Goal: Knowledge of risk factors and measures for prevention of condition will improve Outcome: Progressing   Problem: Coping: Goal: Psychosocial and spiritual needs will be supported Outcome: Progressing

## 2019-10-13 NOTE — Progress Notes (Signed)
Paged on call nephrologist Dr. Carolin Sicks about pt receiving PICC line tonight. Dr. Carolin Sicks asked was pt stable and RN told Dr. Carolin Sicks "yes." Dr. Carolin Sicks said he would prefer for Dr. Hollie Salk to approve PICC line in the am for patient.

## 2019-10-13 NOTE — Progress Notes (Signed)
ANTICOAGULATION CONSULT NOTE - Follow Up Consult  Pharmacy Consult for heparin Indication: atrial fibrillation   Labs: Recent Labs    10/11/19 1309 10/11/19 1616 10/12/19 0213 10/12/19 0431 10/12/19 0433 10/12/19 0500 10/12/19 1852 10/12/19 2150  HGB 9.7*  --   --  9.2*  --   --   --   --   HCT 33.4*  --   --  30.6*  --   --   --   --   PLT 216  --   --  156  --   --   --   --   APTT  --   --   --   --   --   --  >200* >200*  LABPROT  --   --   --  20.3*  --   --   --   --   INR  --   --   --  1.8*  --   --   --   --   HEPARINUNFRC  --   --   --   --   --   --  >2.20* >2.20*  CREATININE 1.91*  --  1.90*  --  1.97*  --   --   --   TROPONINIHS 30* 33*  --  27*  --  31*  --   --     Assessment: 73yo female supratherapeutic on heparin with initial dosing while Eliquis on hold; no gtt issues or signs of bleeding per RN.  Goal of Therapy:  Heparin level 0.3-0.7 units/ml aPTT 66-102 seconds   Plan:  Will hold heparin gtt x1hr then resume heparin at decreased rate of 800 units/hr and check level in 8 hours.    Wynona Neat, PharmD, BCPS  10/13/2019,2:05 AM

## 2019-10-13 NOTE — Progress Notes (Signed)
Records obtained from [facility]   NICM w/ EF previously as low as 35%, that improved w/ medical therapy to 50-55% in 2016. Had [facility] in 2012 and most recently 07/2019, both showing normal coronaries. TTE 02/2017 showed drop in EF down to 40-45%, mild-moderate MR and moderate to severe TR, normal RV. PAP 56 mmHg.  TEE 9/20 showed EF 40-45% w/ mod-severe MR and severe TR>> referred for surgical consultation as pt was highly symptomatic w/ exertional dyspnea and decreased exercise tolerance   Atrial Fibrillation is permanent. Last clinic note from [facility] 07/29/19 outlined considering Afib ablation.

## 2019-10-13 NOTE — Evaluation (Signed)
Physical Therapy Evaluation Patient Details Name: Melissa Adams MRN: [id number] DOB: 02-Aug-1946 Today's Date: 10/13/2019   History of Present Illness  73 y.o. female with a medical history of heart failure, atrial fibrillation, hypertension, sleep apnea, who presented to the emergency department with complaints of shortness of breath and weight gain. 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 In ED found to have an elevated BNP with dyspnea on exertion. CXR showed small right pleural effusion with bibasilar atelectasis. Admitted 10/11/19  Clinical Impression  PTA pt living alone in mobile home with 5 steps to enter. Pt's son in process of putting handrail in. Pt was independent in mobility and iADLs. Pt is currently limited in safe mobility by increased HR and decreased oxygen saturation with movement in presence of generalized weakness and decreased balance. Pt is currently min guard for bed mobility, transfers and ambulation of 20 feet without AD. PT recommends HHPT to improve strength and balance and intermittent support at home for iADLs. PT will continue to follow acutely.     Follow Up Recommendations Home health PT;Supervision - Intermittent    Equipment Recommendations  None recommended by PT       Precautions / Restrictions Precautions Precautions: Fall Precaution Comments: tripped over the dog Restrictions Weight Bearing Restrictions: No      Mobility  Bed Mobility Overal bed mobility: Needs Assistance Bed Mobility: Supine to Sit;Sit to Supine     Supine to sit: Min guard Sit to supine: Min guard   General bed mobility comments: min guard for safety, increased time and effort  Transfers Overall transfer level: Needs assistance Equipment used: None Transfers: Sit to/from Stand Sit to Stand: Min guard         General transfer comment: min guard for safety, good power up and steadying  Ambulation/Gait Ambulation/Gait assistance: Min  guard Gait Distance (Feet): 20 Feet Assistive device: None Gait Pattern/deviations: Step-through pattern;Decreased step length - right;Decreased step length - left;Shuffle Gait velocity: slowed Gait velocity interpretation: 1.31 - 2.62 ft/sec, indicative of limited community ambulator General Gait Details: min guard for slow, mildly unsteady gait, no overt LoB      Balance Overall balance assessment: Mild deficits observed, not formally tested                                           Pertinent Vitals/Pain Pain Assessment: 0-10 Pain Score: 4  Pain Location: L flank with ambulation  Pain Descriptors / Indicators: Throbbing;Aching Pain Intervention(s): Limited activity within patient's tolerance;Monitored during session;Repositioned    Home Living Family/patient expects to be discharged to:: Private residence Living Arrangements: Alone Available Help at Discharge: Available 24 hours/day;Family Type of Home: Mobile home Home Access: Stairs to enter Entrance Stairs-Rails: None Entrance Stairs-Number of Steps: 5 Home Layout: One level Home Equipment: Toilet riser;Cane - quad;Cane - single point      Prior Function Level of Independence: Independent                  Extremity/Trunk Assessment   Upper Extremity Assessment Upper Extremity Assessment: Overall WFL for tasks assessed    Lower Extremity Assessment Lower Extremity Assessment: Overall WFL for tasks assessed       Communication   Communication: No difficulties  Cognition Arousal/Alertness: Awake/alert Behavior During Therapy: WFL for tasks assessed/performed Overall Cognitive Status: Within Functional Limits for tasks assessed  General Comments General comments (skin integrity, edema, etc.): SaO2 on RA low 90%s at rest, dipped to 87%O2 with ambulation, HR in 130s with ambulation,  quickly recovered to low 90%s with sitting on EoB,  2/4 DoE with ambulation         Assessment/Plan    PT Assessment Patient needs continued PT services  PT Problem List Decreased strength;Decreased balance;Decreased mobility;Decreased activity tolerance;Cardiopulmonary status limiting activity;Pain       PT Treatment Interventions DME instruction;Gait training;Functional mobility training;Therapeutic activities;Therapeutic exercise;Balance training;Stair training;Cognitive remediation;Patient/family education    PT Goals (Current goals can be found in the Care Plan section)  Acute Rehab PT Goals Patient Stated Goal: go home PT Goal Formulation: With patient Time For Goal Achievement: 10/27/19 Potential to Achieve Goals: Good    Frequency Min 3X/week   Barriers to discharge        Co-evaluation               AM-PAC PT "6 Clicks" Mobility  Outcome Measure Help needed turning from your back to your side while in a flat bed without using bedrails?: None Help needed moving from lying on your back to sitting on the side of a flat bed without using bedrails?: A Little Help needed moving to and from a bed to a chair (including a wheelchair)?: A Little Help needed standing up from a chair using your arms (e.g., wheelchair or bedside chair)?: A Little Help needed to walk in hospital room?: A Little Help needed climbing 3-5 steps with a railing? : A Lot 6 Click Score: 18    End of Session   Activity Tolerance: Patient tolerated treatment well Patient left: in bed;with call bell/phone within reach Nurse Communication: Mobility status PT Visit Diagnosis: Unsteadiness on feet (R26.81);Other abnormalities of gait and mobility (R26.89);Muscle weakness (generalized) (M62.81);History of falling (Z91.81);Difficulty in walking, not elsewhere classified (R26.2);Pain Pain - Right/Left: Right Pain - part of body: (flank)    Time: 3013-1438 PT Time Calculation (min) (ACUTE ONLY): 18 min   Charges:   PT Evaluation $PT Eval Moderate  Complexity: 1 Mod          Randell Detter B. Migdalia Dk PT, DPT Acute Rehabilitation Services Pager [phone] Office [phone]   Adams 10/13/2019, 3:00 PM

## 2019-10-13 NOTE — Progress Notes (Signed)
PROGRESS NOTE    Melissa Adams  XVQ:[id number] DOB: 04-09-1946 DOA: 10/11/2019 PCP: System, Provider Not In   Brief Narrative:  73 year old female with history of heart failure, atrial fibrillation, hypertension, sleep apnea who presented with complaints of shortness of breath and weight gain.  Patient found to have an elevated BNP in the emergency department along with shortness of breath.  She was given IV Lasix.  Cardiology was consulted.  Of note patient found to be COVID +.   Assessment & Plan:   Active Problems:   Acute CHF (congestive heart failure) (HCC)   Atrial fibrillation, chronic (HCC)   Essential hypertension   Sleep apnea   AKI (acute kidney injury) ([place])   Gout   Normocytic anemia   CHF exacerbation ([place])   COVID positive -Covid tested returned positive -Patient does not seem to be symptomatic from Covid at this time however she does have shortness of breath which likely is related to her CHF exacerbation -Covid markers such as ferritin and CRP were unremarkable.  However LDH was elevated as well as D-dimer. -She has been placed on precautions, airborne and contact. -Patient also given remdesivir and steroids - will continue for now  Melissa Adams    10/12/19 0431 10/13/19 0335 10/13/19 1114  DDIMER 12.41*  --  11.89*  FERRITIN 7*  --   --   LDH 327*  --   --   CRP <0.8 <0.8  --     Lab Results  Component Value Date   SARSCOV2NAA POSITIVE (A) 10/12/2019   SARSCOV2NAA POSITIVE (A) 09/32/6712    Acute systolic CHF exacerbation -Presented with dyspnea on exertion, 22 pound weight gain since September 20, 2019, lower extremity edema -Dry weight is approximately 226 pounds; on admission 249 pounds -BNP was noted to be 3374 on admission.  Chest x-ray did show small right pleural effusion. -On examination patient was hypervolemic with lower extremity edema and dependent sacral edema. -Echocardiogram on August 05, 2019 noted an EF of 40  to 45% (obtain this by calling patient's cardiologist, Dr. Ervin Knack) -echo 11/17 20-25% EF, RV with reduced systolic function, IVC dilated with <50% resp variability (see report) -patient was given IV Lasix-no urine output was noted -Will continue to give IV Lasix and monitor intake and output, daily weights - appreciate cardiology recommendations - fall in EF tachycardia mediated? Vs myocarditis? Vs stress cardiomyopathy.  Placing PICC to follow CVP and co-ox.  Starting on milrinone. - Diuresis per cardiology  Acute kidney injury -Peaked to 1.97 - baseline 1.2 -May possibly be secondary to Lasix use prior to admission or Covid -Patient's creatinine September10,2020 was 1.2 (per previous PN - I don't see this in chart) -Nephrology consulted and appreciated, discussed with Dr. Hollie Salk.  Recommended continuing IV Lasix given that there was a small insignificant bump in her creatinine and continue to monitor closely. -Renal US without hydro, noted R effusion pleural  Elevated D-dimer -possibly secondary to COVID vs PE -Unfortunately cannot obtain CTA of the chest given creatinine.  - V/Q scan negative for PE, LE dopplers also negative (though report confusing, will discuss with vascular) - continue heparin for now - previously on eliquis  Normocytic anemia -Hb stable, follow  Atrial fibrillation -Patient currently in atrial fibrillation -Eliquis was held as she was supposed to have surgery on 10/13/2019 formitral and tricuspid valve replacement - started on amiodarone per cards - metop and cardizem on hold due to bradycardia (cardizem should be d/c'd due  to systolic HF)  Essential hypertension -Metoprolol, Lasix per cards -Roberts Gaudy was noted on her medication list however per daughter she has not been taking this  Sleep apnea -Patient states that she does use a CPAP however has not used it recently as it is broken and is being replaced currently -Given her Covid positive result,  will discontinue CPAP  Valvular Heart disease -As above, patient is scheduled to have a mitral and tricuspid valve replacement on 10/13/2019 in [place] - per cards, recommending holding off on procedure as main issue likely biventricular failure  Gout -Continue allopurinol -Colchicine held  DVT prophylaxis: heparin gtt Code Status: full  Family Communication: none at bedside Disposition Plan: pending further improvement   Consultants:   Cardiology  Nephrology  Procedures:  Echo IMPRESSIONS    1. Left ventricular ejection fraction, by visual estimation, is 20 to 25%. The left ventricle has severely decreased function. There is no left ventricular hypertrophy.  2. Left ventricular diastolic function could not be evaluated.  3. Mildly dilated left ventricular internal cavity size.  4. Global right ventricle has severely reduced systolic function.The right ventricular size is mildly enlarged.  5. Left atrial size was moderately dilated.  6. Right atrial size was severely dilated.  7. The mitral valve is abnormal. Mild to moderate mitral valve regurgitation. No evidence of mitral stenosis.  8. The tricuspid valve is normal in structure. Tricuspid valve regurgitation moderate.  9. The aortic valve is tricuspid. Aortic valve regurgitation is not visualized. Mild aortic valve sclerosis without stenosis. 10. The pulmonic valve was normal in structure. Pulmonic valve regurgitation is mild. 11. Moderately elevated pulmonary artery systolic pressure. 12. The inferior vena cava is dilated in size with <50% respiratory variability, suggesting right atrial pressure of 15 mmHg. 13. Severe global reduction in LV systolic function; 4 chamber enlargement; mild to moderate MR; severely reduced RV function; moderate TR.  Antimicrobials:  Anti-infectives (From admission, onward)   Start     Dose/Rate Route Frequency Ordered Stop   10/13/19 1000  remdesivir 100 mg in sodium  chloride 0.9 % 250 mL IVPB     100 mg 500 mL/hr over 30 Minutes Intravenous Every 24 hours 10/12/19 0600 10/17/19 0959   10/12/19 0700  remdesivir 200 mg in sodium chloride 0.9 % 250 mL IVPB     200 mg 500 mL/hr over 30 Minutes Intravenous Once 10/12/19 0600 10/12/19 0908     Subjective: Feeling better Still with LE swelling  Objective: Vitals:   10/12/19 2334 10/13/19 0806 10/13/19 1136 10/13/19 1300  BP: 127/69 115/69 121/64   Pulse: 88     Resp:  16 19   Temp: (!) 96.8 F (36 C) 97.9 F (36.6 C)  97.8 F (36.6 C)  TempSrc: Oral Oral    SpO2:  96% 100%   Weight: 114.5 kg     Height:        Intake/Output Summary (Last 24 hours) at 10/13/2019 1426 Last data filed at 10/13/2019 0815 Gross per 24 hour  Intake 66 ml  Output 1400 ml  Net -1334 ml   Filed Weights   10/11/19 2044 10/12/19 0500 10/12/19 2334  Weight: 113 kg 112.6 kg 114.5 kg    Examination:  General exam: Appears calm and comfortable  Respiratory system: unlabored Cardiovascular system: irregularly irregular Gastrointestinal system: Abdomen is nondistended, soft and nontender. Central nervous system: Alert and oriented. No focal neurological deficits. Extremities: bilateral 2+ LE edema, dependent sacral edema Skin: No rashes, lesions or ulcers  Psychiatry: Judgement and insight appear normal. Mood & affect appropriate.     Data Reviewed: I have personally reviewed following labs and imaging studies  CBC: Recent Labs  Lab 10/11/19 1309 10/12/19 0431 10/13/19 0335  WBC 6.2 5.3 4.2  NEUTROABS  --  3.2  --   HGB 9.7* 9.2* 8.9*  HCT 33.4* 30.6* 29.6*  MCV 81.5 78.7* 78.7*  PLT 216 156 536   Basic Metabolic Panel: Recent Labs  Lab 10/11/19 1309 10/12/19 0213 10/12/19 0433 10/13/19 0335  NA 137 137 136 133*  K 4.1 3.7 3.9 4.2  CL 97* 98 97* 94*  CO2 24 23 22 23   GLUCOSE 97 103* 105* 118*  BUN 47* 48* 48* 50*  CREATININE 1.91* 1.90* 1.97* 1.82*  CALCIUM 9.5 9.4 9.2 9.5   GFR:  Estimated Creatinine Clearance: 34.1 mL/min (A) (by C-G formula based on SCr of 1.82 mg/dL (H)). Liver Function Tests: Recent Labs  Lab 10/12/19 0433 10/13/19 0335  AST 68* 78*  ALT 51* 61*  ALKPHOS 181* 192*  BILITOT 1.7* 2.2*  PROT 6.5 6.8  ALBUMIN 3.6 3.5   No results for input(s): LIPASE, AMYLASE in the last 168 hours. No results for input(s): AMMONIA in the last 168 hours. Coagulation Profile: Recent Labs  Lab 10/12/19 0431  INR 1.8*   Cardiac Enzymes: No results for input(s): CKTOTAL, CKMB, CKMBINDEX, TROPONINI in the last 168 hours. BNP (last 3 results) No results for input(s): PROBNP in the last 8760 hours. HbA1C: No results for input(s): HGBA1C in the last 72 hours. CBG: No results for input(s): GLUCAP in the last 168 hours. Lipid Profile: No results for input(s): CHOL, HDL, LDLCALC, TRIG, CHOLHDL, LDLDIRECT in the last 72 hours. Thyroid Function Tests: No results for input(s): TSH, T4TOTAL, FREET4, T3FREE, THYROIDAB in the last 72 hours. Anemia Panel: Recent Labs    10/12/19 0431  FERRITIN 7*   Sepsis Labs: No results for input(s): PROCALCITON, LATICACIDVEN in the last 168 hours.  Recent Results (from the past 240 hour(s))  SARS CORONAVIRUS 2 (TAT 6-24 HRS) Nasopharyngeal Nasopharyngeal Swab     Status: Abnormal   Collection Time: 10/11/19  4:17 PM   Specimen: Nasopharyngeal Swab  Result Value Ref Range Status   SARS Coronavirus 2 POSITIVE (A) NEGATIVE Final    Comment: RESULT CALLED TO, READ BACK BY AND VERIFIED WITH: A.KIRBY,RN 0215 10/12/19 G.MCADOO (NOTE) SARS-CoV-2 target nucleic acids are DETECTED. The SARS-CoV-2 RNA is generally detectable in upper and lower respiratory specimens during the acute phase of infection. Positive results are indicative of active infection with SARS-CoV-2. Clinical  correlation with patient history and other diagnostic information is necessary to determine patient infection status. Positive results do  not rule out  bacterial infection or co-infection with other viruses. The expected result is Negative. Fact Sheet for Patients: SugarRoll.be Fact Sheet for Healthcare Providers: https://www.woods-mathews.com/ This test is not yet approved or cleared by the [place] FDA and  has been authorized for detection and/or diagnosis of SARS-CoV-2 by FDA under an Emergency Use Authorization (EUA). This EUA will remain  in effect (meaning this test can be used) for the  duration of the COVID-19 declaration under Section 564(b)(1) of the Act, 21 U.S.C. section 360bbb-3(b)(1), unless the authorization is terminated or revoked sooner. Performed at [facility], [place] [address] [place], [place] [address]   MRSA PCR Screening     Status: None   Collection Time: 10/11/19  8:44 PM   Specimen: Nasopharyngeal  Result Value  Ref Range Status   MRSA by PCR NEGATIVE NEGATIVE Final    Comment:        The GeneXpert MRSA Assay (FDA approved for NASAL specimens only), is one component of a comprehensive MRSA colonization surveillance program. It is not intended to diagnose MRSA infection nor to guide or monitor treatment for MRSA infections. Performed at [facility], [place] [address], [place], [place] [address]   SARS CORONAVIRUS 2 (TAT 6-24 HRS) Nasopharyngeal Nasopharyngeal Swab     Status: Abnormal   Collection Time: 10/12/19  1:34 PM   Specimen: Nasopharyngeal Swab  Result Value Ref Range Status   SARS Coronavirus 2 POSITIVE (A) NEGATIVE Final    Comment: RESULT CALLED TO, READ BACK BY AND VERIFIED WITH: J CRUISE,RN 1925 10/12/2019 D BRADLEY (NOTE) SARS-CoV-2 target nucleic acids are DETECTED. The SARS-CoV-2 RNA is generally detectable in upper and lower respiratory specimens during the acute phase of infection. Positive results are indicative of active infection with SARS-CoV-2. Clinical  correlation with patient history and other diagnostic  information is necessary to determine patient infection status. Positive results do  not rule out bacterial infection or co-infection with other viruses. The expected result is Negative. Fact Sheet for Patients: SugarRoll.be Fact Sheet for Healthcare Providers: https://www.woods-mathews.com/ This test is not yet approved or cleared by the [place] FDA and  has been authorized for detection and/or diagnosis of SARS-CoV-2 by FDA under an Emergency Use Authorization (EUA). This EUA will remain  in effect (meaning this test can be used) for  the duration of the COVID-19 declaration under Section 564(b)(1) of the Act, 21 U.S.C. section 360bbb-3(b)(1), unless the authorization is terminated or revoked sooner. Performed at [facility], [place] [address] [place], [place] [address]          Radiology Studies: Nm Pulmonary Perfusion  Result Date: 10/12/2019 CLINICAL DATA:  Short of breath.  Heart failure.  COVID positive. EXAM: NUCLEAR MEDICINE PERFUSION LUNG SCAN TECHNIQUE: Perfusion images were obtained in multiple projections after intravenous injection of radiopharmaceutical. RADIOPHARMACEUTICALS:  1.5 mCi Tc-66m MAA COMPARISON:  Chest radiograph 10/11/2019 FINDINGS: No wedge-shaped peripheral perfusion defects within LEFT or RIGHT lung to suggest acute pulmonary embolism. Cardiomegaly. IMPRESSION: No evidence of acute pulmonary embolism. Electronically Signed   By: Suzy Bouchard M.D.   On: 10/12/2019 16:43   US Renal  Result Date: 10/12/2019 CLINICAL DATA:  Acute kidney injury EXAM: RENAL / URINARY TRACT ULTRASOUND COMPLETE COMPARISON:  None. FINDINGS: Right Kidney: Renal measurements: 10 x 4.9 x 5.3 cm = volume: 134.4 mL . Echogenicity within normal limits. No mass or hydronephrosis visualized. Left Kidney: Renal measurements: 9.6 x 5.2 by 3.5 cm = volume: 91.7 mL. Echogenicity within normal limits. No mass or hydronephrosis  visualized. Trace left perinephric fluid. Bladder: Appears normal for degree of bladder distention. Other: Incidental note made of right pleural effusion IMPRESSION: 1. Negative renal ultrasound.  No hydronephrosis 2. Incidental note made of right pleural effusion Electronically Signed   By: Donavan Foil M.D.   On: 10/12/2019 15:48   Dg Chest Port 1 View  Result Date: 10/12/2019 CLINICAL DATA:  Status post VQ scan, right chest discomfort, COVID positive EXAM: PORTABLE CHEST 1 VIEW COMPARISON:  10/11/2019 FINDINGS: Cardiomegaly. Possible small layering pleural effusions. The visualized skeletal structures are unremarkable. IMPRESSION: No significant change in AP portable examination. Cardiomegaly with possible small layering pleural effusions. No acute appearing airspace opacity. Electronically Signed   By: Eddie Candle M.D.   On: 10/12/2019  17:27   Vas [place] Lower Extremity Venous (dvt)  Result Date: 10/13/2019  Lower Venous Study Indications: Swelling.  Risk Factors: COVID 19 positive. Anticoagulation: Heparin. Limitations: Body habitus, poor ultrasound/tissue interface and patient positioning, patient pain tolerance. Comparison Study: No prior studies. Performing Technologist: Oliver Hum RVT  Examination Guidelines: A complete evaluation includes B-mode imaging, spectral Doppler, color Doppler, and power Doppler as needed of all accessible portions of each vessel. Bilateral testing is considered an integral part of a complete examination. Limited examinations for reoccurring indications may be performed as noted.  +---------+---------------+---------+-----------+----------+--------------+ RIGHT    CompressibilityPhasicitySpontaneityPropertiesThrombus Aging +---------+---------------+---------+-----------+----------+--------------+ CFV      Full           Yes      Yes                                 +---------+---------------+---------+-----------+----------+--------------+ SFJ       Full                                                        +---------+---------------+---------+-----------+----------+--------------+ FV Prox  Full                                                        +---------+---------------+---------+-----------+----------+--------------+ FV Mid                  Yes      Yes                                 +---------+---------------+---------+-----------+----------+--------------+ FV Distal               Yes      Yes                                 +---------+---------------+---------+-----------+----------+--------------+ PFV      Full                                                        +---------+---------------+---------+-----------+----------+--------------+ POP      Full           Yes      Yes                                 +---------+---------------+---------+-----------+----------+--------------+ PTV      Full                                                        +---------+---------------+---------+-----------+----------+--------------+ PERO     Full                                                        +---------+---------------+---------+-----------+----------+--------------+   +---------+---------------+---------+-----------+----------+--------------+  LEFT     CompressibilityPhasicitySpontaneityPropertiesThrombus Aging +---------+---------------+---------+-----------+----------+--------------+ CFV      Full           Yes      Yes                                 +---------+---------------+---------+-----------+----------+--------------+ SFJ      Full                                                        +---------+---------------+---------+-----------+----------+--------------+ FV Prox  Full                                                        +---------+---------------+---------+-----------+----------+--------------+ FV Mid                                                 Not visualized +---------+---------------+---------+-----------+----------+--------------+ FV Distal                                             Not visualized +---------+---------------+---------+-----------+----------+--------------+ PFV      Full                                                        +---------+---------------+---------+-----------+----------+--------------+ POP      Full           Yes      Yes                                 +---------+---------------+---------+-----------+----------+--------------+ PTV      Full                                                        +---------+---------------+---------+-----------+----------+--------------+ PERO     Full                                                        +---------+---------------+---------+-----------+----------+--------------+     Summary: Right: There is no evidence of deep vein thrombosis in the lower extremity. However, portions of this examination were limited- see technologist comments above. No cystic structure found in the popliteal fossa. Left: There is no evidence of deep vein thrombosis in the lower extremity. However, portions of this examination were limited- see technologist comments above. No cystic structure found  in the popliteal fossa. The common femoral vein obstruction does not  appear to extend above inguinal ligament.  *See table(s) above for measurements and observations. Electronically signed by Harold Barban MD on 10/13/2019 at 1:52:15 PM.    Final    [place] Ekg Site Rite  Result Date: 10/13/2019 If Site Rite image not attached, placement could not be confirmed due to current cardiac rhythm.  [place] Ekg Site Rite  Result Date: 10/13/2019 If Site Rite image not attached, placement could not be confirmed due to current cardiac rhythm.       Scheduled Meds: . allopurinol  300 mg Oral Daily  . atorvastatin  40 mg Oral Daily  . Chlorhexidine Gluconate Cloth  6 each  Topical Daily  . dexamethasone (DECADRON) injection  6 mg Intravenous Q24H  . furosemide  80 mg Intravenous BID  . multivitamin with minerals  1 tablet Oral Daily  . sodium chloride flush  3 mL Intravenous Q12H  . zinc sulfate  220 mg Oral Daily   Continuous Infusions: . sodium chloride    . amiodarone 60 mg/hr (10/13/19 1324)   Followed by  . amiodarone    . heparin 800 Units/hr (10/13/19 0316)  . milrinone    . remdesivir 100 mg in NS 250 mL 100 mg (10/13/19 1213)     LOS: 1 day    Time spent: over 30 min    Fayrene Helper, MD [facility] Pager AMION  If 7PM-7AM, please contact night-coverage [email or web address] Password [email or web address] 10/13/2019, 2:26 PM

## 2019-10-13 NOTE — Progress Notes (Signed)
Bilateral lower extremity venous duplex has been completed. Preliminary results can be found in CV Proc through chart review.   10/13/19 12:41 PM Carlos Levering RVT

## 2019-10-13 NOTE — Progress Notes (Signed)
ANTICOAGULATION CONSULT NOTE  Pharmacy Consult for heparin Indication: atrial fibrillation  No Known Allergies  Patient Measurements: Height: 5\' 3"  (160 cm) Weight: 252 lb 6.8 oz (114.5 kg) IBW/kg (Calculated) : 52.4 Heparin Dosing Weight: 79.8 kg  Vital Signs: Temp: 97.8 F (36.6 C) (11/18 1300) Temp Source: Oral (11/18 0806) BP: 121/64 (11/18 1136)  Labs: Recent Labs    10/11/19 1309 10/11/19 1616 10/12/19 0213 10/12/19 0431 10/12/19 0433 10/12/19 0500  10/12/19 1852 10/12/19 2150 10/13/19 0335 10/13/19 1114 10/13/19 1509  HGB 9.7*  --   --  9.2*  --   --   --   --   --  8.9*  --   --   HCT 33.4*  --   --  30.6*  --   --   --   --   --  29.6*  --   --   PLT 216  --   --  156  --   --   --   --   --  196  --   --   APTT  --   --   --   --   --   --    < > >200* >200*  --  >200* 154*  LABPROT  --   --   --  20.3*  --   --   --   --   --   --   --   --   INR  --   --   --  1.8*  --   --   --   --   --   --   --   --   HEPARINUNFRC  --   --   --   --   --   --   --  >2.20* >2.20*  --  2.12*  --   CREATININE 1.91*  --  1.90*  --  1.97*  --   --   --   --  1.82*  --   --   TROPONINIHS 30* 33*  --  27*  --  31*  --   --   --   --   --   --    < > = values in this interval not displayed.    Estimated Creatinine Clearance: 34.1 mL/min (A) (by C-G formula based on SCr of 1.82 mg/dL (H)).   Medical History: Past Medical History:  Diagnosis Date  . A-fib ([place])   . Cardiomyopathy ([place])   . CHF (congestive heart failure) ([place])   . Edema   . Hypertension   . Pulmonary hypertension ([place])   . Sleep apnea   . Tricuspid regurgitation     Medications:  Scheduled:  . allopurinol  300 mg Oral Daily  . atorvastatin  40 mg Oral Daily  . Chlorhexidine Gluconate Cloth  6 each Topical Daily  . dexamethasone (DECADRON) injection  6 mg Intravenous Q24H  . furosemide  80 mg Intravenous BID  . multivitamin with minerals  1 tablet Oral Daily  . sodium chloride flush  3 mL  Intravenous Q12H  . zinc sulfate  220 mg Oral Daily    Assessment: 65 yof presenting with SOB and weight gain - on apixaban PTA, has been on hold for mitral/tricupsid valve replacement scheduled for 11/18 (last dose on 11/8). Also found to be COVID +.   2nd aPTT supratherapeutic at 154 seconds, drawn correctly  Goal of Therapy:  Heparin level 0.3-0.7 units/ml  APTT 66-102  seconds Monitor platelets by anticoagulation protocol: Yes   Plan:  Decrease heparin gtt to 650 units/hr F/u 8 hour aPTT  Bertis Ruddy, PharmD Clinical Pharmacist Please check AMION for all [employer] numbers 10/13/2019 4:51 PM

## 2019-10-14 ENCOUNTER — Inpatient Hospital Stay: Payer: Self-pay

## 2019-10-14 ENCOUNTER — Inpatient Hospital Stay (HOSPITAL_COMMUNITY): Payer: Medicare Other

## 2019-10-14 DIAGNOSIS — I4891 Unspecified atrial fibrillation: Secondary | ICD-10-CM

## 2019-10-14 DIAGNOSIS — N179 Acute kidney failure, unspecified: Secondary | ICD-10-CM | POA: Diagnosis not present

## 2019-10-14 DIAGNOSIS — I5043 Acute on chronic combined systolic (congestive) and diastolic (congestive) heart failure: Secondary | ICD-10-CM | POA: Diagnosis not present

## 2019-10-14 LAB — COMPREHENSIVE METABOLIC PANEL
ALT: 65 U/L — ABNORMAL HIGH (ref 0–44)
AST: 79 U/L — ABNORMAL HIGH (ref 15–41)
Albumin: 3.7 g/dL (ref 3.5–5.0)
Alkaline Phosphatase: 178 U/L — ABNORMAL HIGH (ref 38–126)
Anion gap: 13 (ref 5–15)
BUN: 51 mg/dL — ABNORMAL HIGH (ref 8–23)
CO2: 26 mmol/L (ref 22–32)
Calcium: 9.2 mg/dL (ref 8.9–10.3)
Chloride: 93 mmol/L — ABNORMAL LOW (ref 98–111)
Creatinine, Ser: 2.09 mg/dL — ABNORMAL HIGH (ref 0.44–1.00)
GFR calc Af Amer: 27 mL/min — ABNORMAL LOW (ref >60)
GFR calc non Af Amer: 23 mL/min — ABNORMAL LOW (ref >60)
Glucose, Bld: 130 mg/dL — ABNORMAL HIGH (ref 70–99)
Potassium: 4 mmol/L (ref 3.5–5.1)
Sodium: 132 mmol/L — ABNORMAL LOW (ref 135–145)
Total Bilirubin: 2.1 mg/dL — ABNORMAL HIGH (ref 0.3–1.2)
Total Protein: 6.8 g/dL (ref 6.5–8.1)

## 2019-10-14 LAB — IRON AND TIBC
Iron: 18 ug/dL — ABNORMAL LOW (ref 28–170)
Saturation Ratios: 3 % — ABNORMAL LOW (ref 10.4–31.8)
TIBC: 564 ug/dL — ABNORMAL HIGH (ref 250–450)
UIBC: 546 ug/dL

## 2019-10-14 LAB — RENAL FUNCTION PANEL
Albumin: 3.4 g/dL — ABNORMAL LOW (ref 3.5–5.0)
Anion gap: 15 (ref 5–15)
BUN: 52 mg/dL — ABNORMAL HIGH (ref 8–23)
CO2: 28 mmol/L (ref 22–32)
Calcium: 9.2 mg/dL (ref 8.9–10.3)
Chloride: 92 mmol/L — ABNORMAL LOW (ref 98–111)
Creatinine, Ser: 2.35 mg/dL — ABNORMAL HIGH (ref 0.44–1.00)
GFR calc Af Amer: 23 mL/min — ABNORMAL LOW (ref >60)
GFR calc non Af Amer: 20 mL/min — ABNORMAL LOW (ref >60)
Glucose, Bld: 122 mg/dL — ABNORMAL HIGH (ref 70–99)
Phosphorus: 3.7 mg/dL (ref 2.5–4.6)
Potassium: 3.7 mmol/L (ref 3.5–5.1)
Sodium: 135 mmol/L (ref 135–145)

## 2019-10-14 LAB — CBC
HCT: 28.2 % — ABNORMAL LOW (ref 36.0–46.0)
Hemoglobin: 8.8 g/dL — ABNORMAL LOW (ref 12.0–15.0)
MCH: 24 pg — ABNORMAL LOW (ref 26.0–34.0)
MCHC: 31.2 g/dL (ref 30.0–36.0)
MCV: 76.8 fL — ABNORMAL LOW (ref 80.0–100.0)
Platelets: 235 10*3/uL (ref 150–400)
RBC: 3.67 MIL/uL — ABNORMAL LOW (ref 3.87–5.11)
RDW: 17.1 % — ABNORMAL HIGH (ref 11.5–15.5)
WBC: 7.6 10*3/uL (ref 4.0–10.5)
nRBC: 0.4 % — ABNORMAL HIGH (ref 0.0–0.2)

## 2019-10-14 LAB — COOXEMETRY PANEL
Carboxyhemoglobin: 1.3 % (ref 0.5–1.5)
Methemoglobin: 0.7 % (ref 0.0–1.5)
O2 Saturation: 62.2 %
Total hemoglobin: 8.5 g/dL — ABNORMAL LOW (ref 12.0–16.0)

## 2019-10-14 LAB — MAGNESIUM: Magnesium: 2.4 mg/dL (ref 1.7–2.4)

## 2019-10-14 LAB — D-DIMER, QUANTITATIVE: D-Dimer, Quant: 8.32 ug/mL-FEU — ABNORMAL HIGH (ref 0.00–0.50)

## 2019-10-14 LAB — HEPARIN LEVEL (UNFRACTIONATED): Heparin Unfractionated: 1.54 IU/mL — ABNORMAL HIGH (ref 0.30–0.70)

## 2019-10-14 LAB — C-REACTIVE PROTEIN: CRP: <0.8 mg/dL (ref <1.0)

## 2019-10-14 LAB — FOLATE: Folate: 27.4 ng/mL (ref >5.9)

## 2019-10-14 LAB — VITAMIN B12: Vitamin B-12: 2376 pg/mL — ABNORMAL HIGH (ref 180–914)

## 2019-10-14 LAB — APTT
aPTT: 90 seconds — ABNORMAL HIGH (ref 24–36)
aPTT: 79 seconds — ABNORMAL HIGH (ref 24–36)

## 2019-10-14 LAB — FERRITIN: Ferritin: 10 ng/mL — ABNORMAL LOW (ref 11–307)

## 2019-10-14 MED ORDER — SODIUM CHLORIDE 0.9% FLUSH
10.0000 mL | Freq: Two times a day (BID) | INTRAVENOUS | Status: DC
  Administered 2019-10-14: 10 mL
  Administered 2019-10-14: 20 mL
  Administered 2019-10-15 – 2019-10-18 (×4): 10 mL

## 2019-10-14 MED ORDER — FUROSEMIDE 10 MG/ML IJ SOLN
20.0000 mg/h | INTRAVENOUS | Status: DC
  Administered 2019-10-14: 12 mg/h via INTRAVENOUS
  Administered 2019-10-16 (×2): 15 mg/h via INTRAVENOUS
  Administered 2019-10-17 – 2019-10-18 (×2): 20 mg/h via INTRAVENOUS
  Filled 2019-10-14 (×7): qty 25

## 2019-10-14 MED ORDER — SODIUM CHLORIDE 0.9 % IV SOLN
510.0000 mg | INTRAVENOUS | Status: AC
  Administered 2019-10-14 – 2019-10-17 (×2): 510 mg via INTRAVENOUS
  Filled 2019-10-14 (×2): qty 17

## 2019-10-14 MED ORDER — SODIUM CHLORIDE 0.9% FLUSH: 10.0000 mL | INTRAVENOUS | Status: DC | PRN

## 2019-10-14 NOTE — Progress Notes (Addendum)
[facility] Progress Note    Assessment/ Plan:   1.  AKI on CKD 3: baseline Cr reportedly 1.2.  Likely d/t decompensated CHF.  Possible that COVID AKI could be playing into this but remains to be seen.  UA with 6-10 RBCs and no WBCs, UP/C 0.2, not really reflective of GN. Renal US neg.  If cardiorenal syndrome at play, would expect Cr to start to drift down with the addition of milrinone and UOP pick up.  Would continue Lasix 80 IV BID for now pending next set of labs  Labs are from midnight- would get a PM set of labs to look at today as well.  If PICC needed that's fine.  2.  Severe biventricular CHF: was due for MV and TV replacement in [place] on 11/18.  TTE performed--> Biventricular failure with EF 20-25%.   Pressures soft, metop held  3.  COVID + : on supplemental O2 on hep gtt, V/Q scan ordered, getting remdesivir  4.  Afib; was on metop and eliquis as OP  5.  Dispo: 2W  Subjective:    Cr fluctuating up to 2.0 today, overall not much of a change during this hospital admission.  Milrinone started yesterday, more UOP.  In Afib.  On RA charted.      Objective:   BP 123/80 (BP Location: Left Leg)   Pulse 100   Temp 97.8 F (36.6 C) (Oral)   Resp 14   Ht 5\' 3"  (1.6 m)   Wt 114.5 kg   SpO2 96%   BMI 44.72 kg/m   Intake/Output Summary (Last 24 hours) at 10/14/2019 0902 Last data filed at 10/13/2019 1800 Gross per 24 hour  Intake 328.94 ml  Output 650 ml  Net -321.06 ml   Weight change:   Physical Exam:  Pt not seen in effort to preserve PPE  Imaging: Nm Pulmonary Perfusion  Result Date: 10/12/2019 CLINICAL DATA:  Short of breath.  Heart failure.  COVID positive. EXAM: NUCLEAR MEDICINE PERFUSION LUNG SCAN TECHNIQUE: Perfusion images were obtained in multiple projections after intravenous injection of radiopharmaceutical. RADIOPHARMACEUTICALS:  1.5 mCi Tc-40m MAA COMPARISON:  Chest radiograph 10/11/2019 FINDINGS: No wedge-shaped peripheral perfusion  defects within LEFT or RIGHT lung to suggest acute pulmonary embolism. Cardiomegaly. IMPRESSION: No evidence of acute pulmonary embolism. Electronically Signed   By: Suzy Bouchard M.D.   On: 10/12/2019 16:43   US Renal  Result Date: 10/12/2019 CLINICAL DATA:  Acute kidney injury EXAM: RENAL / URINARY TRACT ULTRASOUND COMPLETE COMPARISON:  None. FINDINGS: Right Kidney: Renal measurements: 10 x 4.9 x 5.3 cm = volume: 134.4 mL . Echogenicity within normal limits. No mass or hydronephrosis visualized. Left Kidney: Renal measurements: 9.6 x 5.2 by 3.5 cm = volume: 91.7 mL. Echogenicity within normal limits. No mass or hydronephrosis visualized. Trace left perinephric fluid. Bladder: Appears normal for degree of bladder distention. Other: Incidental note made of right pleural effusion IMPRESSION: 1. Negative renal ultrasound.  No hydronephrosis 2. Incidental note made of right pleural effusion Electronically Signed   By: Donavan Foil M.D.   On: 10/12/2019 15:48   Dg Chest Port 1 View  Result Date: 10/12/2019 CLINICAL DATA:  Status post VQ scan, right chest discomfort, COVID positive EXAM: PORTABLE CHEST 1 VIEW COMPARISON:  10/11/2019 FINDINGS: Cardiomegaly. Possible small layering pleural effusions. The visualized skeletal structures are unremarkable. IMPRESSION: No significant change in AP portable examination. Cardiomegaly with possible small layering pleural effusions. No acute appearing airspace opacity. Electronically Signed   By:  Eddie Candle M.D.   On: 10/12/2019 17:27   Vas [place] Lower Extremity Venous (dvt)  Result Date: 10/13/2019  Lower Venous Study Indications: Swelling.  Risk Factors: COVID 19 positive. Anticoagulation: Heparin. Limitations: Body habitus, poor ultrasound/tissue interface and patient positioning, patient pain tolerance. Comparison Study: No prior studies. Performing Technologist: Oliver Hum RVT  Examination Guidelines: A complete evaluation includes B-mode imaging,  spectral Doppler, color Doppler, and power Doppler as needed of all accessible portions of each vessel. Bilateral testing is considered an integral part of a complete examination. Limited examinations for reoccurring indications may be performed as noted.  +---------+---------------+---------+-----------+----------+--------------+ RIGHT    CompressibilityPhasicitySpontaneityPropertiesThrombus Aging +---------+---------------+---------+-----------+----------+--------------+ CFV      Full           Yes      Yes                                 +---------+---------------+---------+-----------+----------+--------------+ SFJ      Full                                                        +---------+---------------+---------+-----------+----------+--------------+ FV Prox  Full                                                        +---------+---------------+---------+-----------+----------+--------------+ FV Mid                  Yes      Yes                                 +---------+---------------+---------+-----------+----------+--------------+ FV Distal               Yes      Yes                                 +---------+---------------+---------+-----------+----------+--------------+ PFV      Full                                                        +---------+---------------+---------+-----------+----------+--------------+ POP      Full           Yes      Yes                                 +---------+---------------+---------+-----------+----------+--------------+ PTV      Full                                                        +---------+---------------+---------+-----------+----------+--------------+ PERO     Full                                                        +---------+---------------+---------+-----------+----------+--------------+   +---------+---------------+---------+-----------+----------+--------------+  LEFT      CompressibilityPhasicitySpontaneityPropertiesThrombus Aging +---------+---------------+---------+-----------+----------+--------------+ CFV      Full           Yes      Yes                                 +---------+---------------+---------+-----------+----------+--------------+ SFJ      Full                                                        +---------+---------------+---------+-----------+----------+--------------+ FV Prox  Full                                                        +---------+---------------+---------+-----------+----------+--------------+ FV Mid                                                Not visualized +---------+---------------+---------+-----------+----------+--------------+ FV Distal                                             Not visualized +---------+---------------+---------+-----------+----------+--------------+ PFV      Full                                                        +---------+---------------+---------+-----------+----------+--------------+ POP      Full           Yes      Yes                                 +---------+---------------+---------+-----------+----------+--------------+ PTV      Full                                                        +---------+---------------+---------+-----------+----------+--------------+ PERO     Full                                                        +---------+---------------+---------+-----------+----------+--------------+     Summary: Right: There is no evidence of deep vein thrombosis in the lower extremity. However, portions of this examination were limited- see technologist comments above. No cystic structure found in the popliteal fossa. Left: There is no evidence of deep vein thrombosis in the lower extremity. However, portions of this examination were limited- see technologist comments above. No cystic structure found  in the popliteal fossa. The  common femoral vein obstruction does not  appear to extend above inguinal ligament.  *See table(s) above for measurements and observations. Electronically signed by Harold Barban MD on 10/13/2019 at 1:52:15 PM.    Final    [place] Ekg Site Rite  Result Date: 10/13/2019 If Site Rite image not attached, placement could not be confirmed due to current cardiac rhythm.  [place] Ekg Site Rite  Result Date: 10/13/2019 If Site Rite image not attached, placement could not be confirmed due to current cardiac rhythm.   Labs: BMET Recent Labs  Lab 10/11/19 1309 10/12/19 0213 10/12/19 0433 10/13/19 0335 10/14/19 0059  NA 137 137 136 133* 132*  K 4.1 3.7 3.9 4.2 4.0  CL 97* 98 97* 94* 93*  CO2 24 23 22 23 26   GLUCOSE 97 103* 105* 118* 130*  BUN 47* 48* 48* 50* 51*  CREATININE 1.91* 1.90* 1.97* 1.82* 2.09*  CALCIUM 9.5 9.4 9.2 9.5 9.2   CBC Recent Labs  Lab 10/11/19 1309 10/12/19 0431 10/13/19 0335 10/14/19 0059  WBC 6.2 5.3 4.2 7.6  NEUTROABS  --  3.2  --   --   HGB 9.7* 9.2* 8.9* 8.8*  HCT 33.4* 30.6* 29.6* 28.2*  MCV 81.5 78.7* 78.7* 76.8*  PLT 216 156 196 235    Medications:    . allopurinol  300 mg Oral Daily  . atorvastatin  40 mg Oral Daily  . Chlorhexidine Gluconate Cloth  6 each Topical Daily  . dexamethasone (DECADRON) injection  6 mg Intravenous Q24H  . furosemide  80 mg Intravenous BID  . multivitamin with minerals  1 tablet Oral Daily  . sodium chloride flush  3 mL Intravenous Q12H  . vitamin C  500 mg Oral Daily  . zinc sulfate  220 mg Oral Daily      Madelon Lips, MD 10/14/2019, 9:02 AM

## 2019-10-14 NOTE — Progress Notes (Signed)
ANTICOAGULATION CONSULT NOTE  Pharmacy Consult for Heparin Indication: atrial fibrillation  No Known Allergies  Patient Measurements: Height: 5\' 3"  (160 cm) Weight: 252 lb 6.8 oz (114.5 kg) IBW/kg (Calculated) : 52.4 Heparin Dosing Weight: 79.8 kg  Vital Signs: Temp: 97 F (36.1 C) (11/19 0018) Temp Source: Oral (11/19 0018) BP: 109/77 (11/19 0018) Pulse Rate: 118 (11/19 0018)  Labs: Recent Labs    10/11/19 1616 10/12/19 0213 10/12/19 0431 10/12/19 0433 10/12/19 0500  10/12/19 1852 10/12/19 2150 10/13/19 0335 10/13/19 1114 10/13/19 1509 10/14/19 0059  HGB  --   --  9.2*  --   --   --   --   --  8.9*  --   --  8.8*  HCT  --   --  30.6*  --   --   --   --   --  29.6*  --   --  28.2*  PLT  --   --  156  --   --   --   --   --  196  --   --  235  APTT  --   --   --   --   --    < > >200* >200*  --  >200* 154* 90*  LABPROT  --   --  20.3*  --   --   --   --   --   --   --   --   --   INR  --   --  1.8*  --   --   --   --   --   --   --   --   --   HEPARINUNFRC  --   --   --   --   --   --  >2.20* >2.20*  --  2.12*  --   --   CREATININE  --  1.90*  --  1.97*  --   --   --   --  1.82*  --   --   --   TROPONINIHS 33*  --  27*  --  31*  --   --   --   --   --   --   --    < > = values in this interval not displayed.    Estimated Creatinine Clearance: 34.1 mL/min (A) (by C-G formula based on SCr of 1.82 mg/dL (H)).   Medical History: Past Medical History:  Diagnosis Date  . A-fib ([place])   . Cardiomyopathy ([place])   . CHF (congestive heart failure) ([place])   . Edema   . Hypertension   . Pulmonary hypertension ([place])   . Sleep apnea   . Tricuspid regurgitation     Medications:  Scheduled:  . allopurinol  300 mg Oral Daily  . atorvastatin  40 mg Oral Daily  . Chlorhexidine Gluconate Cloth  6 each Topical Daily  . dexamethasone (DECADRON) injection  6 mg Intravenous Q24H  . furosemide  80 mg Intravenous BID  . multivitamin with minerals  1 tablet Oral Daily  . sodium  chloride flush  3 mL Intravenous Q12H  . vitamin C  500 mg Oral Daily  . zinc sulfate  220 mg Oral Daily    Assessment: 72 yof presenting with SOB and weight gain - on apixaban PTA, has been on hold for mitral/tricupsid valve replacement scheduled for 11/18 (last dose on 11/8). Also found to be COVID +.   11/19 AM update:  APTT therapeutic x 1 after rate decrease  Goal of Therapy:  Heparin level 0.3-0.7 units/ml  APTT 66-102 seconds Monitor platelets by anticoagulation protocol: Yes   Plan:  Cont heparin at 650 units/hr Confirmatory aPTT at [place], PharmD, [place] Pharmacist Phone: [phone]

## 2019-10-14 NOTE — Progress Notes (Signed)
Peripherally Inserted Central Catheter/Midline Placement  The IV Nurse has discussed with the patient and/or persons authorized to consent for the patient, the purpose of this procedure and the potential benefits and risks involved with this procedure.  The benefits include less needle sticks, lab draws from the catheter, and the patient may be discharged home with the catheter. Risks include, but not limited to, infection, bleeding, blood clot (thrombus formation), and puncture of an artery; nerve damage and irregular heartbeat and possibility to perform a PICC exchange if needed/ordered by physician.  Alternatives to this procedure were also discussed.  Bard Power PICC patient education guide, fact sheet on infection prevention and patient information card has been provided to patient /or left at bedside.    PICC/Midline Placement Documentation  PICC Triple Lumen 15/37/94 PICC Left Basilic 46 cm 0 cm (Active)   Verbal consent with 2  IV RN witness signed    Synthia Innocent 10/14/2019, 11:27 AM

## 2019-10-14 NOTE — Progress Notes (Signed)
Orthopedic Tech Progress Note Patient Details:  Melissa Adams [birth year] [id number]  Patient ID: Antonieta Loveless, female   DOB: 02/12/1946, 73 y.o.   MRN: [id number]   [place] Pink 10/14/2019, 1:21 PMOrtho out off unna boots.

## 2019-10-14 NOTE — Progress Notes (Signed)
Melissa Adams for Heparin Indication: atrial fibrillation  No Known Allergies  Patient Measurements: Height: 5\' 3"  (160 cm) Weight: 250 lb (113.4 kg) IBW/kg (Calculated) : 52.4 Heparin Dosing Weight: 79.8 kg  Vital Signs: Temp: 98 F (36.7 C) (11/19 0717) Temp Source: Oral (11/19 0717) BP: 104/75 (11/19 0717) Pulse Rate: 100 (11/19 0354)  Labs: Recent Labs    10/11/19 1616  10/12/19 0431 10/12/19 0433 10/12/19 0500  10/12/19 2150 10/13/19 0335 10/13/19 1114 10/13/19 1509 10/14/19 0059 10/14/19 1053  HGB  --   --  9.2*  --   --   --   --  8.9*  --   --  8.8*  --   HCT  --   --  30.6*  --   --   --   --  29.6*  --   --  28.2*  --   PLT  --   --  156  --   --   --   --  196  --   --  235  --   APTT  --   --   --   --   --    < > >200*  --  >200* 154* 90* 79*  LABPROT  --   --  20.3*  --   --   --   --   --   --   --   --   --   INR  --   --  1.8*  --   --   --   --   --   --   --   --   --   HEPARINUNFRC  --   --   --   --   --    < > >2.20*  --  2.12*  --  1.54*  --   CREATININE  --    < >  --  1.97*  --   --   --  1.82*  --   --  2.09*  --   TROPONINIHS 33*  --  27*  --  31*  --   --   --   --   --   --   --    < > = values in this interval not displayed.    Estimated Creatinine Clearance: 29.5 mL/min (A) (by C-G formula based on SCr of 2.09 mg/dL (H)).   Medical History: Past Medical History:  Diagnosis Date  . A-fib ([place])   . Cardiomyopathy ([place])   . CHF (congestive heart failure) ([place])   . Edema   . Hypertension   . Pulmonary hypertension ([place])   . Sleep apnea   . Tricuspid regurgitation     Medications:  Scheduled:  . allopurinol  300 mg Oral Daily  . atorvastatin  40 mg Oral Daily  . Chlorhexidine Gluconate Cloth  6 each Topical Daily  . dexamethasone (DECADRON) injection  6 mg Intravenous Q24H  . multivitamin with minerals  1 tablet Oral Daily  . sodium chloride flush  10-40 mL Intracatheter Q12H  . sodium  chloride flush  3 mL Intravenous Q12H  . vitamin C  500 mg Oral Daily  . zinc sulfate  220 mg Oral Daily    Assessment: 28 yof presenting with SOB and weight gain - on apixaban PTA, has been on hold for mitral/tricupsid valve replacement scheduled for 11/18 (last dose on 11/8). Also found to be COVID +. D-dimer elevated but trending down.  LE dopplers and V/Q scan neg for PE/DVT.   APTT therapeutic x2 on drip rate 650 units/hr. No overt bleeding or infusion issues noted. CBC low but stable.   Goal of Therapy:  Heparin level 0.3-0.7 units/ml  APTT 66-102 seconds Monitor platelets by anticoagulation protocol: Yes   Plan:  Continue heparin at 650 units/hr Daily aPTT, heparin level, CBC Monitor for signs of bleeding  Richardine Service, PharmD PGY1 Pharmacy Resident Phone: [phone] 10/14/2019  12:32 PM  Please check [email or web address] for unit-specific pharmacy phone numbers.

## 2019-10-14 NOTE — Progress Notes (Addendum)
PROGRESS NOTE    Melissa Adams  YPP:[id number] DOB: 08/23/1946 DOA: 10/11/2019 PCP: System, Provider Not In   Brief Narrative:  73 year old female with history of heart failure, atrial fibrillation, hypertension, sleep apnea who presented with complaints of shortness of breath and weight gain.  Patient found to have an elevated BNP in the emergency department along with shortness of breath.  She was given IV Lasix.  Cardiology was consulted.  Of note patient found to be COVID +.   Assessment & Plan:   Active Problems:   Acute CHF (congestive heart failure) (HCC)   Atrial fibrillation, chronic (HCC)   Essential hypertension   Sleep apnea   AKI (acute kidney injury) ([place])   Gout   Normocytic anemia   CHF exacerbation ([place])   COVID positive -Covid tested returned positive -Patient does not seem to be symptomatic from Covid at this time however she does have shortness of breath which likely is related to her CHF exacerbation -D dimer remains elevated -She has been placed on precautions, airborne and contact. -Patient also given remdesivir and steroids - will d/c as seems to be asx from [place]    10/12/19 0431 10/13/19 0335 10/13/19 1114 10/14/19 0059  DDIMER 12.41*  --  11.89* 8.32*  FERRITIN 7*  --   --  10*  LDH 327*  --   --   --   CRP <0.8 <0.8  --  <0.8    Lab Results  Component Value Date   SARSCOV2NAA POSITIVE (Caydin Yeatts) 10/12/2019   SARSCOV2NAA POSITIVE (Stanlee Roehrig) 45/80/9983    Acute systolic CHF exacerbation -Presented with dyspnea on exertion, 22 pound weight gain since September 20, 2019, lower extremity edema -Dry weight is approximately 226 pounds; on admission 249 pounds -BNP was noted to be 3374 on admission.  Chest x-ray did show small right pleural effusion. -On examination patient was hypervolemic with lower extremity edema and dependent sacral edema. -Echocardiogram on August 05, 2019 noted an EF of 40 to 45% (obtain  this by calling patient's cardiologist, Dr. Ervin Knack) -echo 11/17 20-25% EF, RV with reduced systolic function, IVC dilated with <50% resp variability (see report) -patient was given IV Lasix-no urine output was noted -Will continue to give IV Lasix and monitor intake and output, daily weights - appreciate cardiology recommendations - fall in EF tachycardia mediated? Vs myocarditis? Vs stress cardiomyopathy.  Placing PICC to follow CVP and co-ox (should be done 11/19).  Started on milrinone on 11/18.  Continue diuresis with lasix gtt.   - Diuresis per cardiology  Acute kidney injury -Increased to 2.09 today - baseline 1.2 -Patient's creatinine September10,2020 was 1.2 (per previous PN - I don't see this in chart) -Nephrology consulted appreciated -> recommending continue lasix -Renal US without hydro, noted R effusion pleural  Elevated D-dimer - possibly secondary to COVID vs PE - Unfortunately cannot obtain CTA of the chest given creatinine.  - V/Q scan negative for PE, LE dopplers also negative (though report confusing, will discuss with vascular - discussed with vascular who confirmed no DVT) - continue heparin for now - previously on eliquis  Normocytic anemia  Iron Deficiency Anemia -Hb stable, follow - iron panel c/w IDA - normal B12 and folate  - IV iron ordered  Atrial fibrillation - Patient currently in atrial fibrillation with RVR - continue amiodarone per cardiology -Eliquis was held as she was supposed to have surgery on 10/13/2019 formitral and tricuspid valve replacement - started  on amiodarone per cards - metop and cardizem on hold due to bradycardia (cardizem should be d/c'd due to systolic HF) - plan for cardioversion eventually - but need TEE and unable with COVID 19 infection  Essential hypertension -Metoprolol, Lasix per cards -Mavik was noted on her medication list however per daughter she has not been taking this  Sleep apnea - Patient states  that she does use Jocee Kissick CPAP however has not used it recently as it is broken and is being replaced currently - Given her Covid positive result, will discontinue CPAP  Valvular Heart disease - As above, patient is scheduled to have Grabiela Wohlford mitral and tricuspid valve replacement on 10/13/2019 in [place] - per cards, recommending holding off on procedure as main issue likely biventricular failure  Gout -Continue allopurinol -Colchicine held  DVT prophylaxis: heparin gtt Code Status: full  Family Communication: none at bedside Disposition Plan: pending further improvement   Consultants:   Cardiology  Nephrology  Procedures:  Echo IMPRESSIONS    1. Left ventricular ejection fraction, by visual estimation, is 20 to 25%. The left ventricle has severely decreased function. There is no left ventricular hypertrophy.  2. Left ventricular diastolic function could not be evaluated.  3. Mildly dilated left ventricular internal cavity size.  4. Global right ventricle has severely reduced systolic function.The right ventricular size is mildly enlarged.  5. Left atrial size was moderately dilated.  6. Right atrial size was severely dilated.  7. The mitral valve is abnormal. Mild to moderate mitral valve regurgitation. No evidence of mitral stenosis.  8. The tricuspid valve is normal in structure. Tricuspid valve regurgitation moderate.  9. The aortic valve is tricuspid. Aortic valve regurgitation is not visualized. Mild aortic valve sclerosis without stenosis. 10. The pulmonic valve was normal in structure. Pulmonic valve regurgitation is mild. 11. Moderately elevated pulmonary artery systolic pressure. 12. The inferior vena cava is dilated in size with <50% respiratory variability, suggesting right atrial pressure of 15 mmHg. 13. Severe global reduction in LV systolic function; 4 chamber enlargement; mild to moderate MR; severely reduced RV function; moderate TR.  Antimicrobials:   Anti-infectives (From admission, onward)   Start     Dose/Rate Route Frequency Ordered Stop   10/13/19 1000  remdesivir 100 mg in sodium chloride 0.9 % 250 mL IVPB     100 mg 500 mL/hr over 30 Minutes Intravenous Every 24 hours 10/12/19 0600 10/17/19 0959   10/12/19 0700  remdesivir 200 mg in sodium chloride 0.9 % 250 mL IVPB     200 mg 500 mL/hr over 30 Minutes Intravenous Once 10/12/19 0600 10/12/19 0908     Subjective: No complaints  Objective: Vitals:   10/14/19 0018 10/14/19 0354 10/14/19 0717 10/14/19 1059  BP: 109/77 123/80 104/75   Pulse: (!) 118 100    Resp: 14  20   Temp: (!) 97 F (36.1 C) 97.8 F (36.6 C) 98 F (36.7 C)   TempSrc: Oral Oral Oral   SpO2: 90% 96% 94%   Weight:    113.4 kg  Height:        Intake/Output Summary (Last 24 hours) at 10/14/2019 1137 Last data filed at 10/13/2019 1800 Gross per 24 hour  Intake 312.94 ml  Output 650 ml  Net -337.06 ml   Filed Weights   10/12/19 0500 10/12/19 2334 10/14/19 1059  Weight: 112.6 kg 114.5 kg 113.4 kg    Examination:  General: No acute distress. Lying flat on back. Cardiovascular: tachy, irregular Lungs: unlabored  Abdomen: Soft, nontender, nondistended  Neurological: Alert and oriented 3. Moves all extremities 4. Cranial nerves II through XII grossly intact. Skin: Warm and dry. No rashes or lesions. Extremities: bilateral LE edema   Data Reviewed: I have personally reviewed following labs and imaging studies  CBC: Recent Labs  Lab 10/11/19 1309 10/12/19 0431 10/13/19 0335 10/14/19 0059  WBC 6.2 5.3 4.2 7.6  NEUTROABS  --  3.2  --   --   HGB 9.7* 9.2* 8.9* 8.8*  HCT 33.4* 30.6* 29.6* 28.2*  MCV 81.5 78.7* 78.7* 76.8*  PLT 216 156 196 884   Basic Metabolic Panel: Recent Labs  Lab 10/11/19 1309 10/12/19 0213 10/12/19 0433 10/13/19 0335 10/14/19 0059  NA 137 137 136 133* 132*  K 4.1 3.7 3.9 4.2 4.0  CL 97* 98 97* 94* 93*  CO2 24 23 22 23 26   GLUCOSE 97 103* 105* 118* 130*   BUN 47* 48* 48* 50* 51*  CREATININE 1.91* 1.90* 1.97* 1.82* 2.09*  CALCIUM 9.5 9.4 9.2 9.5 9.2  MG  --   --   --   --  2.4   GFR: Estimated Creatinine Clearance: 29.5 mL/min (Roseline Ebarb) (by C-G formula based on SCr of 2.09 mg/dL (H)). Liver Function Tests: Recent Labs  Lab 10/12/19 0433 10/13/19 0335 10/14/19 0059  AST 68* 78* 79*  ALT 51* 61* 65*  ALKPHOS 181* 192* 178*  BILITOT 1.7* 2.2* 2.1*  PROT 6.5 6.8 6.8  ALBUMIN 3.6 3.5 3.7   No results for input(s): LIPASE, AMYLASE in the last 168 hours. No results for input(s): AMMONIA in the last 168 hours. Coagulation Profile: Recent Labs  Lab 10/12/19 0431  INR 1.8*   Cardiac Enzymes: No results for input(s): CKTOTAL, CKMB, CKMBINDEX, TROPONINI in the last 168 hours. BNP (last 3 results) No results for input(s): PROBNP in the last 8760 hours. HbA1C: No results for input(s): HGBA1C in the last 72 hours. CBG: No results for input(s): GLUCAP in the last 168 hours. Lipid Profile: No results for input(s): CHOL, HDL, LDLCALC, TRIG, CHOLHDL, LDLDIRECT in the last 72 hours. Thyroid Function Tests: No results for input(s): TSH, T4TOTAL, FREET4, T3FREE, THYROIDAB in the last 72 hours. Anemia Panel: Recent Labs    10/12/19 0431 10/14/19 0059  FERRITIN 7* 10*   Sepsis Labs: No results for input(s): PROCALCITON, LATICACIDVEN in the last 168 hours.  Recent Results (from the past 240 hour(s))  SARS CORONAVIRUS 2 (TAT 6-24 HRS) Nasopharyngeal Nasopharyngeal Swab     Status: Abnormal   Collection Time: 10/11/19  4:17 PM   Specimen: Nasopharyngeal Swab  Result Value Ref Range Status   SARS Coronavirus 2 POSITIVE (Eulalia Ellerman) NEGATIVE Final    Comment: RESULT CALLED TO, READ BACK BY AND VERIFIED WITH: Naim Murtha.KIRBY,RN 0215 10/12/19 G.MCADOO (NOTE) SARS-CoV-2 target nucleic acids are DETECTED. The SARS-CoV-2 RNA is generally detectable in upper and lower respiratory specimens during the acute phase of infection. Positive results are indicative of  active infection with SARS-CoV-2. Clinical  correlation with patient history and other diagnostic information is necessary to determine patient infection status. Positive results do  not rule out bacterial infection or co-infection with other viruses. The expected result is Negative. Fact Sheet for Patients: SugarRoll.be Fact Sheet for Healthcare Providers: https://www.woods-mathews.com/ This test is not yet approved or cleared by the [place] FDA and  has been authorized for detection and/or diagnosis of SARS-CoV-2 by FDA under an Emergency Use Authorization (EUA). This EUA will remain  in effect (meaning this test can  be used) for the  duration of the COVID-19 declaration under Section 564(b)(1) of the Act, 21 U.S.C. section 360bbb-3(b)(1), unless the authorization is terminated or revoked sooner. Performed at [facility], [place] [address], [place], [place] [address]   MRSA PCR Screening     Status: None   Collection Time: 10/11/19  8:44 PM   Specimen: Nasopharyngeal  Result Value Ref Range Status   MRSA by PCR NEGATIVE NEGATIVE Final    Comment:        The GeneXpert MRSA Assay (FDA approved for NASAL specimens only), is one component of [facility] comprehensive MRSA colonization surveillance program. It is not intended to diagnose MRSA infection nor to guide or monitor treatment for MRSA infections. Performed at [facility], [place] [address], [place], [place] [address]   SARS CORONAVIRUS 2 (TAT 6-24 HRS) Nasopharyngeal Nasopharyngeal Swab     Status: Abnormal   Collection Time: 10/12/19  1:34 PM   Specimen: Nasopharyngeal Swab  Result Value Ref Range Status   SARS Coronavirus 2 POSITIVE (Sherran Margolis) NEGATIVE Final    Comment: RESULT CALLED TO, READ BACK BY AND VERIFIED WITH: J CRUISE,RN 1925 10/12/2019 D BRADLEY (NOTE) SARS-CoV-2 target nucleic acids are DETECTED. The SARS-CoV-2 RNA is generally detectable in upper and lower  respiratory specimens during the acute phase of infection. Positive results are indicative of active infection with SARS-CoV-2. Clinical  correlation with patient history and other diagnostic information is necessary to determine patient infection status. Positive results do  not rule out bacterial infection or co-infection with other viruses. The expected result is Negative. Fact Sheet for Patients: SugarRoll.be Fact Sheet for Healthcare Providers: https://www.woods-mathews.com/ This test is not yet approved or cleared by the [place] FDA and  has been authorized for detection and/or diagnosis of SARS-CoV-2 by FDA under an Emergency Use Authorization (EUA). This EUA will remain  in effect (meaning this test can be used) for  the duration of the COVID-19 declaration under Section 564(b)(1) of the Act, 21 U.S.C. section 360bbb-3(b)(1), unless the authorization is terminated or revoked sooner. Performed at [facility], [place] [address], [place], [place] [address]          Radiology Studies: Nm Pulmonary Perfusion  Result Date: 10/12/2019 CLINICAL DATA:  Short of breath.  Heart failure.  COVID positive. EXAM: NUCLEAR MEDICINE PERFUSION LUNG SCAN TECHNIQUE: Perfusion images were obtained in multiple projections after intravenous injection of radiopharmaceutical. RADIOPHARMACEUTICALS:  1.5 mCi Tc-54m MAA COMPARISON:  Chest radiograph 10/11/2019 FINDINGS: No wedge-shaped peripheral perfusion defects within LEFT or RIGHT lung to suggest acute pulmonary embolism. Cardiomegaly. IMPRESSION: No evidence of acute pulmonary embolism. Electronically Signed   By: Suzy Bouchard M.D.   On: 10/12/2019 16:43   US Renal  Result Date: 10/12/2019 CLINICAL DATA:  Acute kidney injury EXAM: RENAL / URINARY TRACT ULTRASOUND COMPLETE COMPARISON:  None. FINDINGS: Right Kidney: Renal measurements: 10 x 4.9 x 5.3 cm = volume: 134.4 mL . Echogenicity within  normal limits. No mass or hydronephrosis visualized. Left Kidney: Renal measurements: 9.6 x 5.2 by 3.5 cm = volume: 91.7 mL. Echogenicity within normal limits. No mass or hydronephrosis visualized. Trace left perinephric fluid. Bladder: Appears normal for degree of bladder distention. Other: Incidental note made of right pleural effusion IMPRESSION: 1. Negative renal ultrasound.  No hydronephrosis 2. Incidental note made of right pleural effusion Electronically Signed   By: Donavan Foil M.D.   On: 10/12/2019 15:48   Dg Chest Port 1 View  Result Date:  10/12/2019 CLINICAL DATA:  Status post VQ scan, right chest discomfort, COVID positive EXAM: PORTABLE CHEST 1 VIEW COMPARISON:  10/11/2019 FINDINGS: Cardiomegaly. Possible small layering pleural effusions. The visualized skeletal structures are unremarkable. IMPRESSION: No significant change in AP portable examination. Cardiomegaly with possible small layering pleural effusions. No acute appearing airspace opacity. Electronically Signed   By: Eddie Candle M.D.   On: 10/12/2019 17:27   Vas [place] Lower Extremity Venous (dvt)  Result Date: 10/13/2019  Lower Venous Study Indications: Swelling.  Risk Factors: COVID 19 positive. Anticoagulation: Heparin. Limitations: Body habitus, poor ultrasound/tissue interface and patient positioning, patient pain tolerance. Comparison Study: No prior studies. Performing Technologist: Oliver Hum RVT  Examination Guidelines: Shon Indelicato complete evaluation includes B-mode imaging, spectral Doppler, color Doppler, and power Doppler as needed of all accessible portions of each vessel. Bilateral testing is considered an integral part of Paris Hohn complete examination. Limited examinations for reoccurring indications may be performed as noted.  +---------+---------------+---------+-----------+----------+--------------+ RIGHT    CompressibilityPhasicitySpontaneityPropertiesThrombus Aging  +---------+---------------+---------+-----------+----------+--------------+ CFV      Full           Yes      Yes                                 +---------+---------------+---------+-----------+----------+--------------+ SFJ      Full                                                        +---------+---------------+---------+-----------+----------+--------------+ FV Prox  Full                                                        +---------+---------------+---------+-----------+----------+--------------+ FV Mid                  Yes      Yes                                 +---------+---------------+---------+-----------+----------+--------------+ FV Distal               Yes      Yes                                 +---------+---------------+---------+-----------+----------+--------------+ PFV      Full                                                        +---------+---------------+---------+-----------+----------+--------------+ POP      Full           Yes      Yes                                 +---------+---------------+---------+-----------+----------+--------------+ PTV      Full                                                        +---------+---------------+---------+-----------+----------+--------------+  PERO     Full                                                        +---------+---------------+---------+-----------+----------+--------------+   +---------+---------------+---------+-----------+----------+--------------+ LEFT     CompressibilityPhasicitySpontaneityPropertiesThrombus Aging +---------+---------------+---------+-----------+----------+--------------+ CFV      Full           Yes      Yes                                 +---------+---------------+---------+-----------+----------+--------------+ SFJ      Full                                                         +---------+---------------+---------+-----------+----------+--------------+ FV Prox  Full                                                        +---------+---------------+---------+-----------+----------+--------------+ FV Mid                                                Not visualized +---------+---------------+---------+-----------+----------+--------------+ FV Distal                                             Not visualized +---------+---------------+---------+-----------+----------+--------------+ PFV      Full                                                        +---------+---------------+---------+-----------+----------+--------------+ POP      Full           Yes      Yes                                 +---------+---------------+---------+-----------+----------+--------------+ PTV      Full                                                        +---------+---------------+---------+-----------+----------+--------------+ PERO     Full                                                        +---------+---------------+---------+-----------+----------+--------------+  Summary: Right: There is no evidence of deep vein thrombosis in the lower extremity. However, portions of this examination were limited- see technologist comments above. No cystic structure found in the popliteal fossa. Left: There is no evidence of deep vein thrombosis in the lower extremity. However, portions of this examination were limited- see technologist comments above. No cystic structure found in the popliteal fossa. The common femoral vein obstruction does not  appear to extend above inguinal ligament.  *See table(s) above for measurements and observations. Electronically signed by Harold Barban MD on 10/13/2019 at 1:52:15 PM.    Final    [place] Ekg Site Rite  Result Date: 10/14/2019 If Site Rite image not attached, placement could not be confirmed due to current cardiac rhythm.   [place] Ekg Site Rite  Result Date: 10/13/2019 If Site Rite image not attached, placement could not be confirmed due to current cardiac rhythm.  [place] Ekg Site Rite  Result Date: 10/13/2019 If Site Rite image not attached, placement could not be confirmed due to current cardiac rhythm.       Scheduled Meds: . allopurinol  300 mg Oral Daily  . atorvastatin  40 mg Oral Daily  . Chlorhexidine Gluconate Cloth  6 each Topical Daily  . dexamethasone (DECADRON) injection  6 mg Intravenous Q24H  . multivitamin with minerals  1 tablet Oral Daily  . sodium chloride flush  10-40 mL Intracatheter Q12H  . sodium chloride flush  3 mL Intravenous Q12H  . vitamin C  500 mg Oral Daily  . zinc sulfate  220 mg Oral Daily   Continuous Infusions: . sodium chloride    . amiodarone 30 mg/hr (10/14/19 0241)  . furosemide (LASIX) infusion    . heparin 650 Units/hr (10/14/19 0843)  . milrinone 0.25 mcg/kg/min (10/14/19 0242)  . remdesivir 100 mg in NS 250 mL 100 mg (10/13/19 1213)     LOS: 2 days    Time spent: over 30 min    Fayrene Helper, MD [facility] Pager AMION  If 7PM-7AM, please contact night-coverage [email or web address] Password [facility] 10/14/2019, 11:37 AM

## 2019-10-14 NOTE — Progress Notes (Signed)
Patient ID: Melissa Adams, female   DOB: May 28, 1946, 73 y.o.   MRN: [id number]     Advanced Heart Failure Rounding Note  PCP-Cardiologist: No primary care provider on file.   Subjective:    She remains in atrial fibrillation with rate 110s-120s on amiodarone gtt 30 mg/hr. She was started on milrinone 0.25 empirically yesterday and remains on Lasix 80 mg IV bid.  HR has not changed much with milrinone but is high.  Creatinine up to 2.09 from 1.8.    Unfortunately, no PICC line placed yet.   Objective:   Weight Range: 114.5 kg Body mass index is 44.72 kg/m.   Vital Signs:   Temp:  [97 F (36.1 C)-98.2 F (36.8 C)] 97.8 F (36.6 C) (11/19 0354) Pulse Rate:  [100-118] 100 (11/19 0354) Resp:  [14-20] 14 (11/19 0018) BP: (109-123)/(64-80) 123/80 (11/19 0354) SpO2:  [90 %-100 %] 96 % (11/19 0354) Last BM Date: (PTA)  Weight change: Filed Weights   10/11/19 2044 10/12/19 0500 10/12/19 2334  Weight: 113 kg 112.6 kg 114.5 kg    Intake/Output:   Intake/Output Summary (Last 24 hours) at 10/14/2019 1009 Last data filed at 10/13/2019 1800 Gross per 24 hour  Intake 320.94 ml  Output 650 ml  Net -329.06 ml      Physical Exam    General:  Well appearing. No resp difficulty HEENT: Normal Neck: Supple. JVP 14+ cm. Carotids 2+ bilat; no bruits. No lymphadenopathy or thyromegaly appreciated. Cor: PMI nondisplaced. Tachy, irregular rate & rhythm. 1/6 HSM LLSB.  Lungs: Clear Abdomen: Soft, nontender, nondistended. No hepatosplenomegaly. No bruits or masses. Good bowel sounds. Extremities: No cyanosis, clubbing, rash. 1+ edema to knees.  Neuro: Alert & orientedx3, cranial nerves grossly intact. moves all 4 extremities w/o difficulty. Affect pleasant   Telemetry   Atrial fibrillation rate 110s-120s (personally reviewed).   Labs    CBC Recent Labs    10/12/19 0431 10/13/19 0335 10/14/19 0059  WBC 5.3 4.2 7.6  NEUTROABS 3.2  --   --   HGB 9.2* 8.9* 8.8*  HCT 30.6*  29.6* 28.2*  MCV 78.7* 78.7* 76.8*  PLT 156 196 283   Basic Metabolic Panel Recent Labs    10/13/19 0335 10/14/19 0059  NA 133* 132*  K 4.2 4.0  CL 94* 93*  CO2 23 26  GLUCOSE 118* 130*  BUN 50* 51*  CREATININE 1.82* 2.09*  CALCIUM 9.5 9.2  MG  --  2.4   Liver Function Tests Recent Labs    10/13/19 0335 10/14/19 0059  AST 78* 79*  ALT 61* 65*  ALKPHOS 192* 178*  BILITOT 2.2* 2.1*  PROT 6.8 6.8  ALBUMIN 3.5 3.7   No results for input(s): LIPASE, AMYLASE in the last 72 hours. Cardiac Enzymes No results for input(s): CKTOTAL, CKMB, CKMBINDEX, TROPONINI in the last 72 hours.  BNP: BNP (last 3 results) Recent Labs    10/11/19 1309  BNP 3,374.7*    ProBNP (last 3 results) No results for input(s): PROBNP in the last 8760 hours.   D-Dimer Recent Labs    10/13/19 1114 10/14/19 0059  DDIMER 11.89* 8.32*   Hemoglobin A1C No results for input(s): HGBA1C in the last 72 hours. Fasting Lipid Panel No results for input(s): CHOL, HDL, LDLCALC, TRIG, CHOLHDL, LDLDIRECT in the last 72 hours. Thyroid Function Tests No results for input(s): TSH, T4TOTAL, T3FREE, THYROIDAB in the last 72 hours.  Invalid input(s): FREET3  Other results:   Imaging    Vas [place] Lower  Extremity Venous (dvt)  Result Date: 10/13/2019  Lower Venous Study Indications: Swelling.  Risk Factors: COVID 19 positive. Anticoagulation: Heparin. Limitations: Body habitus, poor ultrasound/tissue interface and patient positioning, patient pain tolerance. Comparison Study: No prior studies. Performing Technologist: Oliver Hum RVT  Examination Guidelines: A complete evaluation includes B-mode imaging, spectral Doppler, color Doppler, and power Doppler as needed of all accessible portions of each vessel. Bilateral testing is considered an integral part of a complete examination. Limited examinations for reoccurring indications may be performed as noted.   +---------+---------------+---------+-----------+----------+--------------+  RIGHT     Compressibility Phasicity Spontaneity Properties Thrombus Aging  +---------+---------------+---------+-----------+----------+--------------+  CFV       Full            Yes       Yes                                    +---------+---------------+---------+-----------+----------+--------------+  SFJ       Full                                                             +---------+---------------+---------+-----------+----------+--------------+  FV Prox   Full                                                             +---------+---------------+---------+-----------+----------+--------------+  FV Mid                    Yes       Yes                                    +---------+---------------+---------+-----------+----------+--------------+  FV Distal                 Yes       Yes                                    +---------+---------------+---------+-----------+----------+--------------+  PFV       Full                                                             +---------+---------------+---------+-----------+----------+--------------+  POP       Full            Yes       Yes                                    +---------+---------------+---------+-----------+----------+--------------+  PTV       Full                                                             +---------+---------------+---------+-----------+----------+--------------+  PERO      Full                                                             +---------+---------------+---------+-----------+----------+--------------+   +---------+---------------+---------+-----------+----------+--------------+  LEFT      Compressibility Phasicity Spontaneity Properties Thrombus Aging  +---------+---------------+---------+-----------+----------+--------------+  CFV       Full            Yes       Yes                                     +---------+---------------+---------+-----------+----------+--------------+  SFJ       Full                                                             +---------+---------------+---------+-----------+----------+--------------+  FV Prox   Full                                                             +---------+---------------+---------+-----------+----------+--------------+  FV Mid                                                     Not visualized  +---------+---------------+---------+-----------+----------+--------------+  FV Distal                                                  Not visualized  +---------+---------------+---------+-----------+----------+--------------+  PFV       Full                                                             +---------+---------------+---------+-----------+----------+--------------+  POP       Full            Yes       Yes                                    +---------+---------------+---------+-----------+----------+--------------+  PTV       Full                                                             +---------+---------------+---------+-----------+----------+--------------+  PERO      Full                                                             +---------+---------------+---------+-----------+----------+--------------+     Summary: Right: There is no evidence of deep vein thrombosis in the lower extremity. However, portions of this examination were limited- see technologist comments above. No cystic structure found in the popliteal fossa. Left: There is no evidence of deep vein thrombosis in the lower extremity. However, portions of this examination were limited- see technologist comments above. No cystic structure found in the popliteal fossa. The common femoral vein obstruction does not  appear to extend above inguinal ligament.  *See table(s) above for measurements and observations. Electronically signed by Harold Barban MD on 10/13/2019 at 1:52:15 PM.    Final     [place] Ekg Site Rite  Result Date: 10/14/2019 If Site Rite image not attached, placement could not be confirmed due to current cardiac rhythm.  [place] Ekg Site Rite  Result Date: 10/13/2019 If Site Rite image not attached, placement could not be confirmed due to current cardiac rhythm.  [place] Ekg Site Rite  Result Date: 10/13/2019 If Site Rite image not attached, placement could not be confirmed due to current cardiac rhythm.     Medications:     Scheduled Medications:  allopurinol  300 mg Oral Daily   atorvastatin  40 mg Oral Daily   Chlorhexidine Gluconate Cloth  6 each Topical Daily   dexamethasone (DECADRON) injection  6 mg Intravenous Q24H   multivitamin with minerals  1 tablet Oral Daily   sodium chloride flush  3 mL Intravenous Q12H   vitamin C  500 mg Oral Daily   zinc sulfate  220 mg Oral Daily     Infusions:  sodium chloride     amiodarone 30 mg/hr (10/14/19 0241)   furosemide (LASIX) infusion     heparin 650 Units/hr (10/14/19 0843)   milrinone 0.25 mcg/kg/min (10/14/19 0242)   remdesivir 100 mg in NS 250 mL 100 mg (10/13/19 1213)     PRN Medications:  sodium chloride, acetaminophen, cyclobenzaprine, ondansetron (ZOFRAN) IV, sodium chloride flush, traMADol   Assessment/Plan   1. Acute on chronic systolic CHF:  Nonischemic cardiomyopathy, cath in [place] in 9/20 with no significant coronary disease.  Echo with EF 20-25% with dilated and severely dysfunctional RV with D-shaped interventricular septum.  MR appears moderate and TR appears moderate.  With dilated RV, V/Q scan was done and was negative.  Biventricular failure.  Last echo in [place] per report showed EF 40-45%.  Cause of fall in EF uncertain, could be due to atrial fibrillation with RVR (tachycardia-mediated).  Apparently chronic AF but last clinic note from outpatient cardiologist outlines plan for possible atrial fibrillation ablation.  Myocarditis, potentially from COVID-19 is possible.   It is also possible that she has a stress cardiomyopathy due to medical illness/COVID-19 infection.  I have been concerned for low output HF with rise in creatinine and difficulty with diuresis.  Started milrinone yesterday at 0.25, not much change in HR (has been high, remained high).  Some diuresis with Lasix 80 mg IV bid but not marked.  Creatinine 1.8 => 2.  - Place PICC line to follow CVP and co-ox.  Need this today.  -  She got Lasix 80 mg IV x 1 today already, start Lasix gtt 12 mg/hr.  - Continue milrinone 0.25 mcg/kg/min empirically for now.   2. Valvular heart disease: Patient was supposed to have mitral and tricuspid valve replacements this week.  TEE in 9/20 in [place] showed mod-severe MR and severe TR.  On my read of the echo done here, she has moderate MR and moderate TR (both central and likely functional).  Her main issue here seems to be biventricular failure and would hold off on valve procedures for the time being.  3. Atrial fibrillation: Chronic per outpatient notes.  There apparently has been consideration for atrial fibrillation ablation. She remains in RVR, HR 110s-120s today.  - Increase amiodarone gtt to 60 mg/hr for better HR control.  - Continue heparin gtt for now.  - Eventually would like to try to cardiovert her, but would need TEE and would like to avoid with active COVID-19 infection.  4. COVID-19 infection: No PNA on CXR.  On supplemental O2 but this may be due to CHF.  - Getting remdesivir and dexamethasone.  5. AKI: Per report, baseline creatinine around 1.2, now up to 2.  Possibly due to low output.  See plan with milrinone above.   Length of Stay: 2  [name], MD  10/14/2019, 10:09 AM  Advanced Heart Failure Team Pager [phone] (M-F; 7a - 4p)  Please contact [facility] for night-coverage after hours (4p -7a ) and weekends on [email or web address]

## 2019-10-15 DIAGNOSIS — N179 Acute kidney failure, unspecified: Secondary | ICD-10-CM | POA: Diagnosis not present

## 2019-10-15 DIAGNOSIS — I5043 Acute on chronic combined systolic (congestive) and diastolic (congestive) heart failure: Secondary | ICD-10-CM | POA: Diagnosis not present

## 2019-10-15 LAB — COOXEMETRY PANEL
Carboxyhemoglobin: 2.1 % — ABNORMAL HIGH (ref 0.5–1.5)
Methemoglobin: 1.6 % — ABNORMAL HIGH (ref 0.0–1.5)
O2 Saturation: 63 %
Total hemoglobin: 7.9 g/dL — ABNORMAL LOW (ref 12.0–16.0)

## 2019-10-15 LAB — COMPREHENSIVE METABOLIC PANEL
ALT: 63 U/L — ABNORMAL HIGH (ref 0–44)
AST: 70 U/L — ABNORMAL HIGH (ref 15–41)
Albumin: 3.3 g/dL — ABNORMAL LOW (ref 3.5–5.0)
Alkaline Phosphatase: 153 U/L — ABNORMAL HIGH (ref 38–126)
Anion gap: 15 (ref 5–15)
BUN: 51 mg/dL — ABNORMAL HIGH (ref 8–23)
CO2: 26 mmol/L (ref 22–32)
Calcium: 8.7 mg/dL — ABNORMAL LOW (ref 8.9–10.3)
Chloride: 86 mmol/L — ABNORMAL LOW (ref 98–111)
Creatinine, Ser: 2.41 mg/dL — ABNORMAL HIGH (ref 0.44–1.00)
GFR calc Af Amer: 23 mL/min — ABNORMAL LOW (ref >60)
GFR calc non Af Amer: 19 mL/min — ABNORMAL LOW (ref >60)
Glucose, Bld: 204 mg/dL — ABNORMAL HIGH (ref 70–99)
Potassium: 3.6 mmol/L (ref 3.5–5.1)
Sodium: 127 mmol/L — ABNORMAL LOW (ref 135–145)
Total Bilirubin: 1.9 mg/dL — ABNORMAL HIGH (ref 0.3–1.2)
Total Protein: 6.2 g/dL — ABNORMAL LOW (ref 6.5–8.1)

## 2019-10-15 LAB — BASIC METABOLIC PANEL
Anion gap: 14 (ref 5–15)
BUN: 52 mg/dL — ABNORMAL HIGH (ref 8–23)
CO2: 24 mmol/L (ref 22–32)
Calcium: 8.9 mg/dL (ref 8.9–10.3)
Chloride: 90 mmol/L — ABNORMAL LOW (ref 98–111)
Creatinine, Ser: 2.53 mg/dL — ABNORMAL HIGH (ref 0.44–1.00)
GFR calc Af Amer: 21 mL/min — ABNORMAL LOW (ref >60)
GFR calc non Af Amer: 18 mL/min — ABNORMAL LOW (ref >60)
Glucose, Bld: 125 mg/dL — ABNORMAL HIGH (ref 70–99)
Potassium: 3.9 mmol/L (ref 3.5–5.1)
Sodium: 128 mmol/L — ABNORMAL LOW (ref 135–145)

## 2019-10-15 LAB — CBC
HCT: 25.9 % — ABNORMAL LOW (ref 36.0–46.0)
Hemoglobin: 7.8 g/dL — ABNORMAL LOW (ref 12.0–15.0)
MCH: 23.8 pg — ABNORMAL LOW (ref 26.0–34.0)
MCHC: 30.1 g/dL (ref 30.0–36.0)
MCV: 79 fL — ABNORMAL LOW (ref 80.0–100.0)
Platelets: 192 10*3/uL (ref 150–400)
RBC: 3.28 MIL/uL — ABNORMAL LOW (ref 3.87–5.11)
RDW: 17.4 % — ABNORMAL HIGH (ref 11.5–15.5)
WBC: 8.3 10*3/uL (ref 4.0–10.5)
nRBC: 0.5 % — ABNORMAL HIGH (ref 0.0–0.2)

## 2019-10-15 LAB — C-REACTIVE PROTEIN: CRP: <0.8 mg/dL (ref <1.0)

## 2019-10-15 LAB — D-DIMER, QUANTITATIVE: D-Dimer, Quant: 3.45 ug/mL-FEU — ABNORMAL HIGH (ref 0.00–0.50)

## 2019-10-15 LAB — HEMOGLOBIN AND HEMATOCRIT, BLOOD
HCT: 26.4 % — ABNORMAL LOW (ref 36.0–46.0)
Hemoglobin: 8.2 g/dL — ABNORMAL LOW (ref 12.0–15.0)

## 2019-10-15 LAB — FERRITIN: Ferritin: 19 ng/mL (ref 11–307)

## 2019-10-15 LAB — APTT
aPTT: >200 seconds (ref 24–36)
aPTT: 106 seconds — ABNORMAL HIGH (ref 24–36)

## 2019-10-15 LAB — MAGNESIUM: Magnesium: 2.3 mg/dL (ref 1.7–2.4)

## 2019-10-15 LAB — HEPARIN LEVEL (UNFRACTIONATED): Heparin Unfractionated: 0.93 IU/mL — ABNORMAL HIGH (ref 0.30–0.70)

## 2019-10-15 MED ORDER — METOLAZONE 5 MG PO TABS
5.0000 mg | ORAL_TABLET | Freq: Once | ORAL | Status: AC
  Administered 2019-10-15: 5 mg via ORAL
  Filled 2019-10-15: qty 1

## 2019-10-15 MED ORDER — METOPROLOL SUCCINATE ER 25 MG PO TB24
25.0000 mg | ORAL_TABLET | Freq: Every day | ORAL | Status: DC
  Administered 2019-10-15: 25 mg via ORAL
  Filled 2019-10-15: qty 1

## 2019-10-15 MED ORDER — SODIUM CHLORIDE 0.9 % IV SOLN
100.0000 mg | INTRAVENOUS | Status: AC
  Administered 2019-10-15 – 2019-10-16 (×2): 100 mg via INTRAVENOUS
  Filled 2019-10-15 (×2): qty 20

## 2019-10-15 MED ORDER — POTASSIUM CHLORIDE CRYS ER 20 MEQ PO TBCR
40.0000 meq | EXTENDED_RELEASE_TABLET | Freq: Once | ORAL | Status: AC
  Administered 2019-10-15: 40 meq via ORAL
  Filled 2019-10-15: qty 2

## 2019-10-15 MED ORDER — AMIODARONE IV BOLUS ONLY 150 MG/100ML
150.0000 mg | Freq: Once | INTRAVENOUS | Status: AC
  Administered 2019-10-15: 150 mg via INTRAVENOUS

## 2019-10-15 MED ORDER — METOPROLOL SUCCINATE ER 25 MG PO TB24
25.0000 mg | ORAL_TABLET | Freq: Every day | ORAL | Status: DC
  Filled 2019-10-15: qty 1

## 2019-10-15 MED ORDER — NOREPINEPHRINE 4 MG/250ML-% IV SOLN: 0.0000 ug/min | INTRAVENOUS | Status: DC

## 2019-10-15 MED ORDER — SODIUM CHLORIDE 0.9 % IV SOLN
100.0000 mg | INTRAVENOUS | Status: DC
  Filled 2019-10-15: qty 20

## 2019-10-15 NOTE — Progress Notes (Signed)
PT Cancellation Note  Patient Details Name: Melissa Adams MRN: [id number] DOB: 1946-08-12   Cancelled Treatment:    Reason Eval/Treat Not Completed: Medical issues which prohibited therapy RR called this AM and pt transferred to ICU.  Pt now on HR control IV meds and HR still ranging up into the high 120s at rest.  RN recommends holding today.  PT to check back on Monday 10/18/19.  Thanks,  Verdene Lennert, PT, DPT  Acute Rehabilitation [phone] pager [phone] office  @ [email or web address]: [phone]    Harvie Heck 10/15/2019, 5:32 PM

## 2019-10-15 NOTE — Progress Notes (Signed)
ANTICOAGULATION CONSULT NOTE  Pharmacy Consult for Heparin Indication: atrial fibrillation  No Known Allergies  Patient Measurements: Height: 5\' 3"  (160 cm) Weight: 257 lb 0.9 oz (116.6 kg) IBW/kg (Calculated) : 52.4 Heparin Dosing Weight: 79.8 kg  Vital Signs: Temp: 97.6 F (36.4 C) (11/20 0948) Temp Source: Oral (11/20 0948) BP: 97/62 (11/20 1100) Pulse Rate: 139 (11/20 1100)  Labs: Recent Labs    10/13/19 0335 10/13/19 1114  10/14/19 0059 10/14/19 1053 10/14/19 1431 10/15/19 0548  HGB 8.9*  --   --  8.8*  --   --  7.8*  HCT 29.6*  --   --  28.2*  --   --  25.9*  PLT 196  --   --  235  --   --  192  APTT  --  >200*   < > 90* 79*  --  106*  HEPARINUNFRC  --  2.12*  --  1.54*  --   --  0.93*  CREATININE 1.82*  --   --  2.09*  --  2.35* 2.41*   < > = values in this interval not displayed.    Estimated Creatinine Clearance: 26 mL/min (A) (by C-G formula based on SCr of 2.41 mg/dL (H)).   Medical History: Past Medical History:  Diagnosis Date  . A-fib ([place])   . Cardiomyopathy ([place])   . CHF (congestive heart failure) ([place])   . Edema   . Hypertension   . Pulmonary hypertension ([place])   . Sleep apnea   . Tricuspid regurgitation     Medications:  Scheduled:  . allopurinol  300 mg Oral Daily  . atorvastatin  40 mg Oral Daily  . Chlorhexidine Gluconate Cloth  6 each Topical Daily  . metoprolol succinate  25 mg Oral Daily  . multivitamin with minerals  1 tablet Oral Daily  . sodium chloride flush  10-40 mL Intracatheter Q12H  . sodium chloride flush  3 mL Intravenous Q12H  . vitamin C  500 mg Oral Daily  . zinc sulfate  220 mg Oral Daily    Assessment: 38 yof presenting with SOB and weight gain - on apixaban PTA, has been on hold for mitral/tricupsid valve replacement scheduled for 11/18 (last dose on 11/8). Also found to be COVID +. D-dimer elevated but trending down. LE dopplers and V/Q scan neg for PE/DVT.   APTT slightly supratherapeutic at 106 on drip  rate 650 units/hr. Heparin level also supratherapeutic at 0.93, but is not correlating with aPTT and likely still affected by recent apixaban. CBC trending down - Hgb down 8.8 to 7.8 today, plts down to 192.  No overt bleeding or infusion issues per RN.   Goal of Therapy:  Heparin level 0.3-0.7 units/ml  APTT 66-102 seconds Monitor platelets by anticoagulation protocol: Yes   Plan:  Decrease heparin to 600 units/hr Daily aPTT, heparin level, CBC Monitor for signs of bleeding  Richardine Service, PharmD PGY1 Pharmacy Resident Phone: [phone] 10/15/2019  11:19 AM  Please check [email or web address] for unit-specific pharmacy phone numbers.

## 2019-10-15 NOTE — Progress Notes (Signed)
Patient ID: Melissa Adams, female   DOB: 25-Jun-1946, 73 y.o.   MRN: [id number]     Advanced Heart Failure Rounding Note  PCP-Cardiologist: No primary care provider on file.   Subjective:    She remains in atrial fibrillation with rate 110s-120s on amiodarone gtt 60 mg/hr. She was started on milrinone 0.25 and is on Lasix gtt at 12 mg/hr.  Some UOP but not vigorous weights are not accurate.  BP varies but generally SBP appears to be in the 100s range.  Co-ox 62% last night, was not done this morning.  I checked her CVP myself, it is 25. Creatinine continues to rise, up to 2.4 today.   Objective:   Weight Range: 116.6 kg Body mass index is 45.54 kg/m.   Vital Signs:   Temp:  [97.8 F (36.6 C)-98.7 F (37.1 C)] 97.8 F (36.6 C) (11/20 0721) Pulse Rate:  [91-135] 104 (11/20 0721) Resp:  [18-20] 18 (11/20 0721) BP: (104-138)/(58-106) 138/106 (11/20 0721) SpO2:  [94 %-98 %] 98 % (11/20 0721) Weight:  [113.4 kg-116.6 kg] 116.6 kg (11/20 0449) Last BM Date: (PTA)  Weight change: Filed Weights   10/12/19 2334 10/14/19 1059 10/15/19 0449  Weight: 114.5 kg 113.4 kg 116.6 kg    Intake/Output:   Intake/Output Summary (Last 24 hours) at 10/15/2019 8119 Last data filed at 10/15/2019 0116 Gross per 24 hour  Intake 400 ml  Output 1100 ml  Net -700 ml      Physical Exam    General: NAD Neck: JVP 16+ cm, no thyromegaly or thyroid nodule.  Lungs: Clear to auscultation bilaterally with normal respiratory effort. CV: Lateral PMI.  Heart mildly tachy,  irregular S1/S2, no S3/S4, 2/6 HSM LLSB.  2+ edema to knees.   Abdomen: Soft, nontender, no hepatosplenomegaly, no distention.  Skin: Intact without lesions or rashes.  Neurologic: Alert and oriented x 3.  Psych: Normal affect. Extremities: No clubbing or cyanosis.  HEENT: Normal.    Telemetry   Atrial fibrillation rate 110s-120s (personally reviewed).   Labs    CBC Recent Labs    10/14/19 0059 10/15/19 0548  WBC 7.6  8.3  HGB 8.8* 7.8*  HCT 28.2* 25.9*  MCV 76.8* 79.0*  PLT 235 147   Basic Metabolic Panel Recent Labs    10/14/19 0059 10/14/19 1431 10/15/19 0548  NA 132* 135 127*  K 4.0 3.7 3.6  CL 93* 92* 86*  CO2 26 28 26   GLUCOSE 130* 122* 204*  BUN 51* 52* 51*  CREATININE 2.09* 2.35* 2.41*  CALCIUM 9.2 9.2 8.7*  MG 2.4  --  2.3  PHOS  --  3.7  --    Liver Function Tests Recent Labs    10/14/19 0059 10/14/19 1431 10/15/19 0548  AST 79*  --  70*  ALT 65*  --  63*  ALKPHOS 178*  --  153*  BILITOT 2.1*  --  1.9*  PROT 6.8  --  6.2*  ALBUMIN 3.7 3.4* 3.3*   No results for input(s): LIPASE, AMYLASE in the last 72 hours. Cardiac Enzymes No results for input(s): CKTOTAL, CKMB, CKMBINDEX, TROPONINI in the last 72 hours.  BNP: BNP (last 3 results) Recent Labs    10/11/19 1309  BNP 3,374.7*    ProBNP (last 3 results) No results for input(s): PROBNP in the last 8760 hours.   D-Dimer Recent Labs    10/14/19 0059 10/15/19 0548  DDIMER 8.32* 3.45*   Hemoglobin A1C No results for input(s): HGBA1C in the last  72 hours. Fasting Lipid Panel No results for input(s): CHOL, HDL, LDLCALC, TRIG, CHOLHDL, LDLDIRECT in the last 72 hours. Thyroid Function Tests No results for input(s): TSH, T4TOTAL, T3FREE, THYROIDAB in the last 72 hours.  Invalid input(s): FREET3  Other results:   Imaging    Dg Chest Port 1 View  Result Date: 10/14/2019 CLINICAL DATA:  PICC placement, COVID-19 EXAM: PORTABLE CHEST 1 VIEW COMPARISON:  10/11/2019 FINDINGS: Interval placement of left upper extremity PICC, tip positioned near the superior cavoatrial junction. Redemonstrated cardiomegaly. Probable hiatal hernia. No acute abnormality of the lungs. IMPRESSION: Interval placement of left upper extremity PICC, tip positioned near the superior cavoatrial junction. Electronically Signed   By: Eddie Candle M.D.   On: 10/14/2019 13:12   [place] Ekg Site Rite  Result Date: 10/14/2019 If Site Rite image not  attached, placement could not be confirmed due to current cardiac rhythm.    Medications:     Scheduled Medications: . allopurinol  300 mg Oral Daily  . atorvastatin  40 mg Oral Daily  . Chlorhexidine Gluconate Cloth  6 each Topical Daily  . metolazone  5 mg Oral Once  . metoprolol succinate  25 mg Oral Daily  . multivitamin with minerals  1 tablet Oral Daily  . potassium chloride  40 mEq Oral Once  . sodium chloride flush  10-40 mL Intracatheter Q12H  . sodium chloride flush  3 mL Intravenous Q12H  . vitamin C  500 mg Oral Daily  . zinc sulfate  220 mg Oral Daily    Infusions: . sodium chloride    . amiodarone 60 mg/hr (10/15/19 0319)  . ferumoxytol 510 mg (10/14/19 1648)  . furosemide (LASIX) infusion 12 mg/hr (10/14/19 1304)  . heparin 650 Units/hr (10/14/19 0843)  . milrinone 0.25 mcg/kg/min (10/14/19 2232)    PRN Medications: sodium chloride, acetaminophen, cyclobenzaprine, ondansetron (ZOFRAN) IV, sodium chloride flush, sodium chloride flush, traMADol   Assessment/Plan   1. Acute on chronic systolic CHF:  Nonischemic cardiomyopathy, cath in [place] in 9/20 with no significant coronary disease.  Echo with EF 20-25% with dilated and severely dysfunctional RV with D-shaped interventricular septum.  MR appears moderate and TR appears moderate.  With dilated RV, V/Q scan was done and was negative.  Biventricular failure.  Last echo in [place] per report showed EF 40-45%.  Cause of fall in EF uncertain, could be due to atrial fibrillation with RVR (tachycardia-mediated).  Apparently chronic AF but last clinic note from outpatient cardiologist outlines plan for possible atrial fibrillation ablation.  Myocarditis, potentially from COVID-19 is possible.  It is also possible that she has a stress cardiomyopathy due to medical illness/COVID-19 infection.  I have been concerned for low output HF with rise in creatinine and difficulty with diuresis.  Started milrinone at 0.25, not much  change in HR (has been high, remained high).  Co-ox 62% yesterday.  Creatinine 1.8 => 2 => 2.4.  Some diuresis but not vigorous.  I checked CVP myself, it is 25.  Weights are not accurate.  - Continue milrinone 0.25 mcg/kg/min. Send co-ox now.  - Need to push diuresis, increase Lasix to 15 mg/hr and will give metolazone 5 mg x 1.  Replace K.  2. Valvular heart disease: Patient was supposed to have mitral and tricuspid valve replacements this week.  TEE in 9/20 in [place] showed mod-severe MR and severe TR.  On my read of the echo done here, she has moderate MR and moderate TR (both central and likely functional).  Her main issue here seems to be biventricular failure and would hold off on valve procedures for the time being.  3. Atrial fibrillation: Chronic per outpatient notes. Per daughter, she has had cardioversions in the past. There apparently has been consideration for atrial fibrillation ablation. She remains in RVR, HR 110s-120s today.  - Continue amiodarone 60 mg/hr.  - Will try to add Toprol XL 25 mg daily today.   - Continue heparin gtt for now.  - Eventually I think she will need TEE-DCCV.  Would try to get her off milrinone first and will need to be more diuresed.  Procedure will be complicated by [id number]   4. COVID-19 infection: No PNA on CXR.  On supplemental O2 but this may be due to CHF. Seems to be doing ok from this standpoint.  5. AKI: Per report, baseline creatinine around 1.2, now up to 2.4.  Suspect primarily cardiorenal. Markedly volume overloaded with CVP 25.  - Trying to support cardiac output with milrinone.  - Continue to try to control HR.  - Nephrology following.   Discussed the above with her daughter.   I think she would be better served for monitoring purposes in ICU setting.   CRITICAL CARE Performed by: [name]  Total critical care time: 35 minutes  Critical care time was exclusive of separately billable procedures and treating other patients.   Critical care was necessary to treat or prevent imminent or life-threatening deterioration.  Critical care was time spent personally by me on the following activities: development of treatment plan with patient and/or surrogate as well as nursing, discussions with consultants, evaluation of patient's response to treatment, examination of patient, obtaining history from patient or surrogate, ordering and performing treatments and interventions, ordering and review of laboratory studies, ordering and review of radiographic studies, pulse oximetry and re-evaluation of patient's condition.   Length of Stay: 3  [name], MD  10/15/2019, 8:22 AM  Advanced Heart Failure Team Pager [phone] (M-F; 7a - 4p)  Please contact [facility] for night-coverage after hours (4p -7a ) and weekends on [email or web address]

## 2019-10-15 NOTE — Progress Notes (Signed)
PROGRESS NOTE    Melissa Adams  XTK:[id number] DOB: Sep 17, 1946 DOA: 10/11/2019 PCP: System, Provider Not In   Brief Narrative:  73 year old female with history of heart failure, atrial fibrillation, hypertension, sleep apnea who presented with complaints of shortness of breath and weight gain.  Patient found to have an elevated BNP in the emergency department along with shortness of breath.  She was given IV Lasix.  Cardiology was consulted.  Of note patient found to be COVID +.   Assessment & Plan:   Active Problems:   Acute CHF (congestive heart failure) (HCC)   Atrial fibrillation, chronic (HCC)   Essential hypertension   Sleep apnea   AKI (acute kidney injury) ([place])   Gout   Normocytic anemia   CHF exacerbation ([place])   COVID positive -Covid tested returned positive -Patient does not seem to be symptomatic from Covid at this time however she does have shortness of breath which likely is related to her CHF exacerbation -D dimer remains elevated -She has been placed on precautions, airborne and contact. -Patient also given remdesivir and steroids - HF seems to be primary driver of sx - will d/c steroids, but will complete remdesivir.   COVID-19 Labs  Recent Labs    10/13/19 0335 10/13/19 1114 10/14/19 0059 10/15/19 0548  DDIMER  --  11.89* 8.32* 3.45*  FERRITIN  --   --  10* 19  CRP <0.8  --  <0.8 <0.8    Lab Results  Component Value Date   SARSCOV2NAA POSITIVE (Devesh Monforte) 10/12/2019   SARSCOV2NAA POSITIVE (Zaul Hubers) 10/11/2019   Hypotension: concern for hypotension this morning.  This resolved on repeat BP check.  Will hold lasix for 1-2 hours to ensure BP remains stable.  Pt was awake and alert and appropriate.  She's being transferred to ICU for closer monitoring, inotropes, consideration of pressors.  Acute systolic CHF exacerbation -Presented with dyspnea on exertion, 22 pound weight gain since September 20, 2019, lower extremity edema -Dry weight is approximately 226  pounds; on admission 249 pounds -BNP was noted to be 3374 on admission.  Chest x-ray did show small right pleural effusion. -On examination patient was hypervolemic with lower extremity edema and dependent sacral edema. -Echocardiogram on August 05, 2019 noted an EF of 40 to 45% (obtain this by calling patient's cardiologist, Dr. Ervin Knack) -echo 11/17 20-25% EF, RV with reduced systolic function, IVC dilated with <50% resp variability (see report) -Will continue to give IV Lasix and monitor intake and output, daily weights - appreciate cardiology recommendations - fall in EF tachycardia mediated? Vs myocarditis? Vs stress cardiomyopathy.  Placing PICC to follow CVP (26 today) and co-ox (63).  Started on milrinone on 11/18.  Continue diuresis with lasix gtt.  Per cardiology.   - She was transferred to the ICU today for closer monitoring - Diuresis per cardiology  Acute kidney injury  Hyponatremia -Increased to 2.41 today - baseline 1.2 -Patient's creatinine September10,2020 was 1.2 (per previous PN - I don't see this in chart) -Nephrology consulted appreciated -> recommending continue lasix -Renal US without hydro, noted R effusion pleural - suspect hyponatremia volume driven, follow  Elevated D-dimer - possibly secondary to COVID vs PE - Unfortunately cannot obtain CTA of the chest given creatinine.  - V/Q scan negative for PE, LE dopplers also negative (though report confusing, will discuss with vascular - discussed with vascular who confirmed no DVT) - continue heparin for now - previously on eliquis  Normocytic anemia  Iron Deficiency Anemia -  Hb stable, follow - iron panel c/w IDA - normal B12 and folate  - IV iron ordered  Atrial fibrillation - Patient currently in atrial fibrillation with RVR - continue amiodarone per cardiology -Eliquis was held as she was supposed to have surgery on 10/13/2019 formitral and tricuspid valve replacement - started on amiodarone per  cards - metop and cardizem on hold due to bradycardia (cardizem should be d/c'd due to systolic HF) - plan for cardioversion eventually - but need TEE and unable with COVID 19 infection  Essential hypertension -Metoprolol, Lasix per cards -Mavik was noted on her medication list however per daughter she has not been taking this  Sleep apnea - Patient states that she does use Zeus Marquis CPAP however has not used it recently as it is broken and is being replaced currently - Given her Covid positive result, will discontinue CPAP  Valvular Heart disease - As above, patient is scheduled to have Chase Arnall mitral and tricuspid valve replacement on 10/13/2019 in [place] - per cards, recommending holding off on procedure as main issue likely biventricular failure  Gout -Continue allopurinol -Colchicine held  DVT prophylaxis: heparin gtt Code Status: full  Family Communication: none at bedside Disposition Plan: pending further improvement   Consultants:   Cardiology  Nephrology  Procedures:  Echo IMPRESSIONS    1. Left ventricular ejection fraction, by visual estimation, is 20 to 25%. The left ventricle has severely decreased function. There is no left ventricular hypertrophy.  2. Left ventricular diastolic function could not be evaluated.  3. Mildly dilated left ventricular internal cavity size.  4. Global right ventricle has severely reduced systolic function.The right ventricular size is mildly enlarged.  5. Left atrial size was moderately dilated.  6. Right atrial size was severely dilated.  7. The mitral valve is abnormal. Mild to moderate mitral valve regurgitation. No evidence of mitral stenosis.  8. The tricuspid valve is normal in structure. Tricuspid valve regurgitation moderate.  9. The aortic valve is tricuspid. Aortic valve regurgitation is not visualized. Mild aortic valve sclerosis without stenosis. 10. The pulmonic valve was normal in structure. Pulmonic valve  regurgitation is mild. 11. Moderately elevated pulmonary artery systolic pressure. 12. The inferior vena cava is dilated in size with <50% respiratory variability, suggesting right atrial pressure of 15 mmHg. 13. Severe global reduction in LV systolic function; 4 chamber enlargement; mild to moderate MR; severely reduced RV function; moderate TR.  Antimicrobials:  Anti-infectives (From admission, onward)   Start     Dose/Rate Route Frequency Ordered Stop   10/15/19 1600  remdesivir 100 mg in sodium chloride 0.9 % 250 mL IVPB     100 mg 500 mL/hr over 30 Minutes Intravenous Every 24 hours 10/15/19 1020 10/17/19 1559   10/15/19 1200  remdesivir 100 mg in sodium chloride 0.9 % 250 mL IVPB  Status:  Discontinued     100 mg 500 mL/hr over 30 Minutes Intravenous Every 24 hours 10/15/19 1018 10/15/19 1020   10/13/19 1000  remdesivir 100 mg in sodium chloride 0.9 % 250 mL IVPB  Status:  Discontinued     100 mg 500 mL/hr over 30 Minutes Intravenous Every 24 hours 10/12/19 0600 10/14/19 1646   10/12/19 0700  remdesivir 200 mg in sodium chloride 0.9 % 250 mL IVPB     200 mg 500 mL/hr over 30 Minutes Intravenous Once 10/12/19 0600 10/12/19 0908     Subjective: Denies any complaints.  Seen after episode of hypotension, but pt was without any complaints.  Objective: Vitals:   10/15/19 1030 10/15/19 1045 10/15/19 1100 10/15/19 1200  BP: (!) 101/54 109/65 97/62 99/65   Pulse: (!) 139 (!) 129 (!) 139 (!) 128  Resp:   18 (!) 23  Temp:    (!) 97.4 F (36.3 C)  TempSrc:    Oral  SpO2:   94% 97%  Weight:      Height:        Intake/Output Summary (Last 24 hours) at 10/15/2019 1323 Last data filed at 10/15/2019 1100 Gross per 24 hour  Intake 733.77 ml  Output 1700 ml  Net -966.23 ml   Filed Weights   10/12/19 2334 10/14/19 1059 10/15/19 0449  Weight: 114.5 kg 113.4 kg 116.6 kg    Examination:  General: No acute distress. Cardiovascular: RRR Lungs: unlabored Abdomen: Soft, nontender,  nondistended Neurological: Alert and oriented 3. Moves all extremities 4. Cranial nerves II through XII grossly intact. Skin: Warm and dry. No rashes or lesions. Extremities: No clubbing or cyanosis.  Bilateral LE edema.   Data Reviewed: I have personally reviewed following labs and imaging studies  CBC: Recent Labs  Lab 10/11/19 1309 10/12/19 0431 10/13/19 0335 10/14/19 0059 10/15/19 0548  WBC 6.2 5.3 4.2 7.6 8.3  NEUTROABS  --  3.2  --   --   --   HGB 9.7* 9.2* 8.9* 8.8* 7.8*  HCT 33.4* 30.6* 29.6* 28.2* 25.9*  MCV 81.5 78.7* 78.7* 76.8* 79.0*  PLT 216 156 196 235 151   Basic Metabolic Panel: Recent Labs  Lab 10/12/19 0433 10/13/19 0335 10/14/19 0059 10/14/19 1431 10/15/19 0548  NA 136 133* 132* 135 127*  K 3.9 4.2 4.0 3.7 3.6  CL 97* 94* 93* 92* 86*  CO2 22 23 26 28 26   GLUCOSE 105* 118* 130* 122* 204*  BUN 48* 50* 51* 52* 51*  CREATININE 1.97* 1.82* 2.09* 2.35* 2.41*  CALCIUM 9.2 9.5 9.2 9.2 8.7*  MG  --   --  2.4  --  2.3  PHOS  --   --   --  3.7  --    GFR: Estimated Creatinine Clearance: 26 mL/min (Sabrin Dunlevy) (by C-G formula based on SCr of 2.41 mg/dL (H)). Liver Function Tests: Recent Labs  Lab 10/12/19 0433 10/13/19 0335 10/14/19 0059 10/14/19 1431 10/15/19 0548  AST 68* 78* 79*  --  70*  ALT 51* 61* 65*  --  63*  ALKPHOS 181* 192* 178*  --  153*  BILITOT 1.7* 2.2* 2.1*  --  1.9*  PROT 6.5 6.8 6.8  --  6.2*  ALBUMIN 3.6 3.5 3.7 3.4* 3.3*   No results for input(s): LIPASE, AMYLASE in the last 168 hours. No results for input(s): AMMONIA in the last 168 hours. Coagulation Profile: Recent Labs  Lab 10/12/19 0431  INR 1.8*   Cardiac Enzymes: No results for input(s): CKTOTAL, CKMB, CKMBINDEX, TROPONINI in the last 168 hours. BNP (last 3 results) No results for input(s): PROBNP in the last 8760 hours. HbA1C: No results for input(s): HGBA1C in the last 72 hours. CBG: No results for input(s): GLUCAP in the last 168 hours. Lipid Profile: No results  for input(s): CHOL, HDL, LDLCALC, TRIG, CHOLHDL, LDLDIRECT in the last 72 hours. Thyroid Function Tests: No results for input(s): TSH, T4TOTAL, FREET4, T3FREE, THYROIDAB in the last 72 hours. Anemia Panel: Recent Labs    10/14/19 0059 10/14/19 1053 10/15/19 0548  VITAMINB12  --  2,376*  --   FOLATE  --  27.4  --   FERRITIN 10*  --  19  TIBC  --  564*  --   IRON  --  18*  --    Sepsis Labs: No results for input(s): PROCALCITON, LATICACIDVEN in the last 168 hours.  Recent Results (from the past 240 hour(s))  SARS CORONAVIRUS 2 (TAT 6-24 HRS) Nasopharyngeal Nasopharyngeal Swab     Status: Abnormal   Collection Time: 10/11/19  4:17 PM   Specimen: Nasopharyngeal Swab  Result Value Ref Range Status   SARS Coronavirus 2 POSITIVE (Chavela Justiniano) NEGATIVE Final    Comment: RESULT CALLED TO, READ BACK BY AND VERIFIED WITH: Shonna Deiter.KIRBY,RN 0215 10/12/19 G.MCADOO (NOTE) SARS-CoV-2 target nucleic acids are DETECTED. The SARS-CoV-2 RNA is generally detectable in upper and lower respiratory specimens during the acute phase of infection. Positive results are indicative of active infection with SARS-CoV-2. Clinical  correlation with patient history and other diagnostic information is necessary to determine patient infection status. Positive results do  not rule out bacterial infection or co-infection with other viruses. The expected result is Negative. Fact Sheet for Patients: SugarRoll.be Fact Sheet for Healthcare Providers: https://www.woods-mathews.com/ This test is not yet approved or cleared by the [place] FDA and  has been authorized for detection and/or diagnosis of SARS-CoV-2 by FDA under an Emergency Use Authorization (EUA). This EUA will remain  in effect (meaning this test can be used) for the  duration of the COVID-19 declaration under Section 564(b)(1) of the Act, 21 U.S.C. section 360bbb-3(b)(1), unless the authorization is terminated or revoked  sooner. Performed at [facility], [place] [address], [place], [place] [address]   MRSA PCR Screening     Status: None   Collection Time: 10/11/19  8:44 PM   Specimen: Nasopharyngeal  Result Value Ref Range Status   MRSA by PCR NEGATIVE NEGATIVE Final    Comment:        The GeneXpert MRSA Assay (FDA approved for NASAL specimens only), is one component of Bobi Daudelin comprehensive MRSA colonization surveillance program. It is not intended to diagnose MRSA infection nor to guide or monitor treatment for MRSA infections. Performed at [facility], [place] [address], [place], [place] [address]   SARS CORONAVIRUS 2 (TAT 6-24 HRS) Nasopharyngeal Nasopharyngeal Swab     Status: Abnormal   Collection Time: 10/12/19  1:34 PM   Specimen: Nasopharyngeal Swab  Result Value Ref Range Status   SARS Coronavirus 2 POSITIVE (Vyolet Sakuma) NEGATIVE Final    Comment: RESULT CALLED TO, READ BACK BY AND VERIFIED WITH: J CRUISE,RN 1925 10/12/2019 D BRADLEY (NOTE) SARS-CoV-2 target nucleic acids are DETECTED. The SARS-CoV-2 RNA is generally detectable in upper and lower respiratory specimens during the acute phase of infection. Positive results are indicative of active infection with SARS-CoV-2. Clinical  correlation with patient history and other diagnostic information is necessary to determine patient infection status. Positive results do  not rule out bacterial infection or co-infection with other viruses. The expected result is Negative. Fact Sheet for Patients: SugarRoll.be Fact Sheet for Healthcare Providers: https://www.woods-mathews.com/ This test is not yet approved or cleared by the [place] FDA and  has been authorized for detection and/or diagnosis of SARS-CoV-2 by FDA under an Emergency Use Authorization (EUA). This EUA will remain  in effect (meaning this test can be used) for  the duration of the COVID-19 declaration under Section 564(b)(1) of the  Act, 21 U.S.C. section 360bbb-3(b)(1), unless the authorization is terminated or revoked sooner. Performed at [facility], [place] [address], [place], [place] [address]  Radiology Studies: Dg Chest Port 1 View  Result Date: 10/14/2019 CLINICAL DATA:  PICC placement, COVID-19 EXAM: PORTABLE CHEST 1 VIEW COMPARISON:  10/11/2019 FINDINGS: Interval placement of left upper extremity PICC, tip positioned near the superior cavoatrial junction. Redemonstrated cardiomegaly. Probable hiatal hernia. No acute abnormality of the lungs. IMPRESSION: Interval placement of left upper extremity PICC, tip positioned near the superior cavoatrial junction. Electronically Signed   By: Eddie Candle M.D.   On: 10/14/2019 13:12   [place] Ekg Site Rite  Result Date: 10/14/2019 If Site Rite image not attached, placement could not be confirmed due to current cardiac rhythm.  [place] Ekg Site Rite  Result Date: 10/13/2019 If Site Rite image not attached, placement could not be confirmed due to current cardiac rhythm.       Scheduled Meds: . allopurinol  300 mg Oral Daily  . atorvastatin  40 mg Oral Daily  . Chlorhexidine Gluconate Cloth  6 each Topical Daily  . metoprolol succinate  25 mg Oral Daily  . multivitamin with minerals  1 tablet Oral Daily  . sodium chloride flush  10-40 mL Intracatheter Q12H  . sodium chloride flush  3 mL Intravenous Q12H  . vitamin C  500 mg Oral Daily  . zinc sulfate  220 mg Oral Daily   Continuous Infusions: . sodium chloride    . amiodarone 60 mg/hr (10/15/19 1100)  . ferumoxytol Stopped (10/14/19 1703)  . furosemide (LASIX) infusion 15 mg/hr (10/15/19 1152)  . heparin 600 Units/hr (10/15/19 1152)  . milrinone 0.25 mcg/kg/min (10/15/19 1100)  . norepinephrine (LEVOPHED) Adult infusion    . remdesivir 100 mg in NS 250 mL       LOS: 3 days    Time spent: over 30 min    Fayrene Helper, MD [facility] Pager AMION  If 7PM-7AM, please contact  night-coverage [email or web address] Password [email or web address] 10/15/2019, 1:23 PM

## 2019-10-15 NOTE — Progress Notes (Signed)
    Returned page from [facility] staff nurse-->  On arrival from 2W lasix was on hold with SBP in 100s . 3+ lower extremity edema.  Instructed to resume lasix drip. Add order for Norepi with SBP < 85  Return page from staff nurse [id number]-->  Persistent A fib RVR 120-150 . Metoprolol was not given earlier today due to hypotension.  SBP >90-100s  Instructed to give metoprol 25 mg po now and give 150 mg amio bolus now.   Discussed with Dr Aundra Dubin.   Dreanna Kyllo NP-C  1:27 PM

## 2019-10-15 NOTE — Progress Notes (Signed)
Daily progress note   Upon assessment, BP was 86/64 and pulse was 127 bpm. Lasix, and metoprolol held due to hypotension. Patient was transitioned to a higher level of care due to complex medical needs, and worsening exacerbation of heart failure. Cooxymetry was collected as ordered STAT by physician.

## 2019-10-15 NOTE — Progress Notes (Signed)
[facility] Progress Note    Assessment/ Plan:   1.  AKI on CKD 3: baseline Cr reportedly 1.2.  Likely d/t decompensated CHF.  UA with 6-10 RBCs and no WBCs, UP/C 0.2, not really reflective of GN or COVID AKI. Renal US neg.   Cr uptrending in setting of worsening hemodynamics, Afib, and low BP--> cardiogenic shock picture.  Agree with transfer to ICU, hopefully UOP will pick up and Cr will improve with more inotropes.  2.  Severe biventricular CHF: was due for MV and TV replacement in [place] on 11/18.  TTE performed--> Biventricular failure with EF 20-25%.  Advanced HF following, appreciate them  3.  COVID + : on supplemental O2 on hep gtt, getting remdesivir  4.  Afib; was on metop and eliquis as OP  5.  Dispo: to ICU   Subjective:    Still in Afib, Bps low, not a robust UOP.  To transfer to ICU.  Eyes closed this AM, doesn't appear uncomfortable.  Responds to questions.   Objective:   BP (!) 138/106 (BP Location: Right Leg)   Pulse (!) 134   Temp (!) 97.2 F (36.2 C) (Oral)   Resp 18   Ht 5\' 3"  (1.6 m)   Wt 116.6 kg   SpO2 98%   BMI 45.54 kg/m   Intake/Output Summary (Last 24 hours) at 10/15/2019 0916 Last data filed at 10/15/2019 1324 Gross per 24 hour  Intake 520 ml  Output 1700 ml  Net -1180 ml   Weight change:   Physical Exam:   GEN: older woman, lying in bed HEENT EOMI PERRL NECK + JVD  PULM clear anteriorly CV irregular, tachy ABD soft, mildly distended EXT 3+ LE edema to thigh, cool hands and feet NEURO AAO x 3  Imaging: Dg Chest Port 1 View  Result Date: 10/14/2019 CLINICAL DATA:  PICC placement, COVID-19 EXAM: PORTABLE CHEST 1 VIEW COMPARISON:  10/11/2019 FINDINGS: Interval placement of left upper extremity PICC, tip positioned near the superior cavoatrial junction. Redemonstrated cardiomegaly. Probable hiatal hernia. No acute abnormality of the lungs. IMPRESSION: Interval placement of left upper extremity PICC, tip positioned near the  superior cavoatrial junction. Electronically Signed   By: Eddie Candle M.D.   On: 10/14/2019 13:12   Vas [place] Lower Extremity Venous (dvt)  Result Date: 10/13/2019  Lower Venous Study Indications: Swelling.  Risk Factors: COVID 19 positive. Anticoagulation: Heparin. Limitations: Body habitus, poor ultrasound/tissue interface and patient positioning, patient pain tolerance. Comparison Study: No prior studies. Performing Technologist: Oliver Hum RVT  Examination Guidelines: A complete evaluation includes B-mode imaging, spectral Doppler, color Doppler, and power Doppler as needed of all accessible portions of each vessel. Bilateral testing is considered an integral part of a complete examination. Limited examinations for reoccurring indications may be performed as noted.  +---------+---------------+---------+-----------+----------+--------------+ RIGHT    CompressibilityPhasicitySpontaneityPropertiesThrombus Aging +---------+---------------+---------+-----------+----------+--------------+ CFV      Full           Yes      Yes                                 +---------+---------------+---------+-----------+----------+--------------+ SFJ      Full                                                        +---------+---------------+---------+-----------+----------+--------------+  FV Prox  Full                                                        +---------+---------------+---------+-----------+----------+--------------+ FV Mid                  Yes      Yes                                 +---------+---------------+---------+-----------+----------+--------------+ FV Distal               Yes      Yes                                 +---------+---------------+---------+-----------+----------+--------------+ PFV      Full                                                        +---------+---------------+---------+-----------+----------+--------------+ POP      Full            Yes      Yes                                 +---------+---------------+---------+-----------+----------+--------------+ PTV      Full                                                        +---------+---------------+---------+-----------+----------+--------------+ PERO     Full                                                        +---------+---------------+---------+-----------+----------+--------------+   +---------+---------------+---------+-----------+----------+--------------+ LEFT     CompressibilityPhasicitySpontaneityPropertiesThrombus Aging +---------+---------------+---------+-----------+----------+--------------+ CFV      Full           Yes      Yes                                 +---------+---------------+---------+-----------+----------+--------------+ SFJ      Full                                                        +---------+---------------+---------+-----------+----------+--------------+ FV Prox  Full                                                        +---------+---------------+---------+-----------+----------+--------------+  FV Mid                                                Not visualized +---------+---------------+---------+-----------+----------+--------------+ FV Distal                                             Not visualized +---------+---------------+---------+-----------+----------+--------------+ PFV      Full                                                        +---------+---------------+---------+-----------+----------+--------------+ POP      Full           Yes      Yes                                 +---------+---------------+---------+-----------+----------+--------------+ PTV      Full                                                        +---------+---------------+---------+-----------+----------+--------------+ PERO     Full                                                         +---------+---------------+---------+-----------+----------+--------------+     Summary: Right: There is no evidence of deep vein thrombosis in the lower extremity. However, portions of this examination were limited- see technologist comments above. No cystic structure found in the popliteal fossa. Left: There is no evidence of deep vein thrombosis in the lower extremity. However, portions of this examination were limited- see technologist comments above. No cystic structure found in the popliteal fossa. The common femoral vein obstruction does not  appear to extend above inguinal ligament.  *See table(s) above for measurements and observations. Electronically signed by Harold Barban MD on 10/13/2019 at 1:52:15 PM.    Final    [place] Ekg Site Rite  Result Date: 10/14/2019 If Site Rite image not attached, placement could not be confirmed due to current cardiac rhythm.  [place] Ekg Site Rite  Result Date: 10/13/2019 If Site Rite image not attached, placement could not be confirmed due to current cardiac rhythm.  [place] Ekg Site Rite  Result Date: 10/13/2019 If Site Rite image not attached, placement could not be confirmed due to current cardiac rhythm.   Labs: BMET Recent Labs  Lab 10/11/19 1309 10/12/19 0213 10/12/19 0433 10/13/19 0335 10/14/19 0059 10/14/19 1431 10/15/19 0548  NA 137 137 136 133* 132* 135 127*  K 4.1 3.7 3.9 4.2 4.0 3.7 3.6  CL 97* 98 97* 94* 93* 92* 86*  CO2 24 23 22 23 26 28 26   GLUCOSE 97 103* 105* 118* 130* 122* 204*  BUN 47*  48* 48* 50* 51* 52* 51*  CREATININE 1.91* 1.90* 1.97* 1.82* 2.09* 2.35* 2.41*  CALCIUM 9.5 9.4 9.2 9.5 9.2 9.2 8.7*  PHOS  --   --   --   --   --  3.7  --    CBC Recent Labs  Lab 10/12/19 0431 10/13/19 0335 10/14/19 0059 10/15/19 0548  WBC 5.3 4.2 7.6 8.3  NEUTROABS 3.2  --   --   --   HGB 9.2* 8.9* 8.8* 7.8*  HCT 30.6* 29.6* 28.2* 25.9*  MCV 78.7* 78.7* 76.8* 79.0*  PLT 156 196 235 192    Medications:    . allopurinol  300  mg Oral Daily  . atorvastatin  40 mg Oral Daily  . Chlorhexidine Gluconate Cloth  6 each Topical Daily  . metoprolol succinate  25 mg Oral Daily  . multivitamin with minerals  1 tablet Oral Daily  . sodium chloride flush  10-40 mL Intracatheter Q12H  . sodium chloride flush  3 mL Intravenous Q12H  . vitamin C  500 mg Oral Daily  . zinc sulfate  220 mg Oral Daily      Madelon Lips, MD 10/15/2019, 9:16 AM

## 2019-10-15 NOTE — Significant Event (Signed)
Rapid Response Event Note  Overview: Time Called: 0829 Arrival Time: 0518 Event Type: Cardiac  Initial Focused Assessment: Patient admitted with SOB and fluid overload.  She is being treated for heart failure and RAF.  PICC placed yesterday.  She is on Milrinone, Amiodarone and Heparin infusions.  Lasix was stopped this am because of low BP.    Patient is alert and oriented.  complaining of occasional chest pressure and SOB.   BP 86/64  RAF 127  RR 18  O2 sat 94% on RA  MD upgraded patient to ICU level.  Spoke with daughter "[name]" via phone, she also spoke with her mother via phone.  Assisted with transport to 2M03  Interventions:  Plan of Care (if not transferred):  Event Summary: Name of Physician Notified: Dr Bjorn Pippin at bedside upon my arrival at    Name of Consulting Physician Notified: Dr Aundra Dubin at bedside prior to my arrival at    Outcome: Transferred (Comment)  Event End Time: [place]  Raliegh Ip

## 2019-10-16 DIAGNOSIS — N179 Acute kidney failure, unspecified: Secondary | ICD-10-CM | POA: Diagnosis not present

## 2019-10-16 DIAGNOSIS — I5043 Acute on chronic combined systolic (congestive) and diastolic (congestive) heart failure: Secondary | ICD-10-CM | POA: Diagnosis not present

## 2019-10-16 LAB — COMPREHENSIVE METABOLIC PANEL
ALT: 65 U/L — ABNORMAL HIGH (ref 0–44)
AST: 64 U/L — ABNORMAL HIGH (ref 15–41)
Albumin: 3.2 g/dL — ABNORMAL LOW (ref 3.5–5.0)
Alkaline Phosphatase: 164 U/L — ABNORMAL HIGH (ref 38–126)
Anion gap: 14 (ref 5–15)
BUN: 50 mg/dL — ABNORMAL HIGH (ref 8–23)
CO2: 27 mmol/L (ref 22–32)
Calcium: 9.1 mg/dL (ref 8.9–10.3)
Chloride: 87 mmol/L — ABNORMAL LOW (ref 98–111)
Creatinine, Ser: 2.66 mg/dL — ABNORMAL HIGH (ref 0.44–1.00)
GFR calc Af Amer: 20 mL/min — ABNORMAL LOW (ref >60)
GFR calc non Af Amer: 17 mL/min — ABNORMAL LOW (ref >60)
Glucose, Bld: 110 mg/dL — ABNORMAL HIGH (ref 70–99)
Potassium: 3.7 mmol/L (ref 3.5–5.1)
Sodium: 128 mmol/L — ABNORMAL LOW (ref 135–145)
Total Bilirubin: 1.4 mg/dL — ABNORMAL HIGH (ref 0.3–1.2)
Total Protein: 6.2 g/dL — ABNORMAL LOW (ref 6.5–8.1)

## 2019-10-16 LAB — CBC
HCT: 26.6 % — ABNORMAL LOW (ref 36.0–46.0)
Hemoglobin: 8.1 g/dL — ABNORMAL LOW (ref 12.0–15.0)
MCH: 23.7 pg — ABNORMAL LOW (ref 26.0–34.0)
MCHC: 30.5 g/dL (ref 30.0–36.0)
MCV: 77.8 fL — ABNORMAL LOW (ref 80.0–100.0)
Platelets: 200 10*3/uL (ref 150–400)
RBC: 3.42 MIL/uL — ABNORMAL LOW (ref 3.87–5.11)
RDW: 17.5 % — ABNORMAL HIGH (ref 11.5–15.5)
WBC: 6.8 10*3/uL (ref 4.0–10.5)
nRBC: 1 % — ABNORMAL HIGH (ref 0.0–0.2)

## 2019-10-16 LAB — COOXEMETRY PANEL
Carboxyhemoglobin: 1.8 % — ABNORMAL HIGH (ref 0.5–1.5)
Methemoglobin: 1.5 % (ref 0.0–1.5)
O2 Saturation: 76.5 %
Total hemoglobin: 10.4 g/dL — ABNORMAL LOW (ref 12.0–16.0)

## 2019-10-16 LAB — MAGNESIUM: Magnesium: 2.5 mg/dL — ABNORMAL HIGH (ref 1.7–2.4)

## 2019-10-16 LAB — APTT: aPTT: 56 seconds — ABNORMAL HIGH (ref 24–36)

## 2019-10-16 LAB — HEPARIN LEVEL (UNFRACTIONATED): Heparin Unfractionated: 0.39 IU/mL (ref 0.30–0.70)

## 2019-10-16 MED ORDER — ORAL CARE MOUTH RINSE
15.0000 mL | Freq: Two times a day (BID) | OROMUCOSAL | Status: DC
  Administered 2019-10-16 – 2019-11-05 (×34): 15 mL via OROMUCOSAL

## 2019-10-16 MED ORDER — POTASSIUM CHLORIDE CRYS ER 20 MEQ PO TBCR
40.0000 meq | EXTENDED_RELEASE_TABLET | Freq: Once | ORAL | Status: AC
  Administered 2019-10-16: 40 meq via ORAL
  Filled 2019-10-16: qty 2

## 2019-10-16 MED ORDER — METOLAZONE 5 MG PO TABS
5.0000 mg | ORAL_TABLET | Freq: Two times a day (BID) | ORAL | Status: AC
  Administered 2019-10-16 (×2): 5 mg via ORAL
  Filled 2019-10-16 (×2): qty 1

## 2019-10-16 NOTE — Progress Notes (Signed)
Patient ID: Melissa Adams, female   DOB: 1946-09-19, 73 y.o.   MRN: [id number]     Advanced Heart Failure Rounding Note  PCP-Cardiologist: No primary care provider on file.   Subjective:    Somewhat improved overall picture today.  Remains in atrial fibrillation but rate lower 100s-110s.  SBP 100s.  She remains on amiodarone gtt 60 mg/hr, milrinone 0.25 mcg/kg/min, and Lasix 15 mg/hr.  UOP improved, net negative 1249.  Creatinine mildly higher at 2.66 but BUN stable.  CVP remains > 20.  Co-ox 76.5%.   Objective:   Weight Range: 117.1 kg Body mass index is 45.73 kg/m.   Vital Signs:   Temp:  [97.2 F (36.2 C)-98.1 F (36.7 C)] 98.1 F (36.7 C) (11/20 1600) Pulse Rate:  [107-141] 127 (11/21 0700) Resp:  [15-26] 19 (11/21 0700) BP: (71-120)/(49-93) 95/60 (11/21 0700) SpO2:  [92 %-100 %] 95 % (11/21 0700) Weight:  [117.1 kg] 117.1 kg (11/21 0430) Last BM Date: (PTA)  Weight change: Filed Weights   10/14/19 1059 10/15/19 0449 10/16/19 0430  Weight: 113.4 kg 116.6 kg 117.1 kg    Intake/Output:   Intake/Output Summary (Last 24 hours) at 10/16/2019 0811 Last data filed at 10/16/2019 0600 Gross per 24 hour  Intake 1931.23 ml  Output 3175 ml  Net -1243.77 ml      Physical Exam    General: NAD Neck: JVP 16 cm, no thyromegaly or thyroid nodule.  Lungs: Clear to auscultation bilaterally with normal respiratory effort. CV: Lateral PMI.  Heart tachy, irregular S1/S2, no S3/S4, 2/6 HSM LLSB.  1+ edema to knees.   Abdomen: Soft, nontender, no hepatosplenomegaly, no distention.  Skin: Intact without lesions or rashes.  Neurologic: Alert and oriented x 3.  Psych: Normal affect. Extremities: No clubbing or cyanosis.  HEENT: Normal.    Telemetry   Atrial fibrillation rate 100s-110s (personally reviewed).   Labs    CBC Recent Labs    10/15/19 0548 10/15/19 1703 10/16/19 0341  WBC 8.3  --  6.8  HGB 7.8* 8.2* 8.1*  HCT 25.9* 26.4* 26.6*  MCV 79.0*  --  77.8*  PLT  192  --  694   Basic Metabolic Panel Recent Labs    10/14/19 1431 10/15/19 0548 10/15/19 1705 10/16/19 0341  NA 135 127* 128* 128*  K 3.7 3.6 3.9 3.7  CL 92* 86* 90* 87*  CO2 28 26 24 27   GLUCOSE 122* 204* 125* 110*  BUN 52* 51* 52* 50*  CREATININE 2.35* 2.41* 2.53* 2.66*  CALCIUM 9.2 8.7* 8.9 9.1  MG  --  2.3  --  2.5*  PHOS 3.7  --   --   --    Liver Function Tests Recent Labs    10/15/19 0548 10/16/19 0341  AST 70* 64*  ALT 63* 65*  ALKPHOS 153* 164*  BILITOT 1.9* 1.4*  PROT 6.2* 6.2*  ALBUMIN 3.3* 3.2*   No results for input(s): LIPASE, AMYLASE in the last 72 hours. Cardiac Enzymes No results for input(s): CKTOTAL, CKMB, CKMBINDEX, TROPONINI in the last 72 hours.  BNP: BNP (last 3 results) Recent Labs    10/11/19 1309  BNP 3,374.7*    ProBNP (last 3 results) No results for input(s): PROBNP in the last 8760 hours.   D-Dimer Recent Labs    10/14/19 0059 10/15/19 0548  DDIMER 8.32* 3.45*   Hemoglobin A1C No results for input(s): HGBA1C in the last 72 hours. Fasting Lipid Panel No results for input(s): CHOL, HDL, LDLCALC, TRIG, CHOLHDL,  LDLDIRECT in the last 72 hours. Thyroid Function Tests No results for input(s): TSH, T4TOTAL, T3FREE, THYROIDAB in the last 72 hours.  Invalid input(s): FREET3  Other results:   Imaging    No results found.   Medications:     Scheduled Medications: . allopurinol  300 mg Oral Daily  . atorvastatin  40 mg Oral Daily  . Chlorhexidine Gluconate Cloth  6 each Topical Daily  . mouth rinse  15 mL Mouth Rinse BID  . multivitamin with minerals  1 tablet Oral Daily  . sodium chloride flush  10-40 mL Intracatheter Q12H  . sodium chloride flush  3 mL Intravenous Q12H  . vitamin C  500 mg Oral Daily  . zinc sulfate  220 mg Oral Daily    Infusions: . sodium chloride    . amiodarone 60 mg/hr (10/16/19 0206)  . ferumoxytol Stopped (10/14/19 1703)  . furosemide (LASIX) infusion 15 mg/hr (10/16/19 0417)  .  heparin 650 Units/hr (10/16/19 0739)  . milrinone 0.25 mcg/kg/min (10/15/19 2152)  . norepinephrine (LEVOPHED) Adult infusion    . remdesivir 100 mg in NS 250 mL Stopped (10/15/19 1904)    PRN Medications: sodium chloride, acetaminophen, cyclobenzaprine, ondansetron (ZOFRAN) IV, sodium chloride flush, sodium chloride flush, traMADol   Assessment/Plan   1. Acute on chronic systolic CHF:  Nonischemic cardiomyopathy, cath in [place] in 9/20 with no significant coronary disease.  Echo with EF 20-25% with dilated and severely dysfunctional RV with D-shaped interventricular septum.  MR appears moderate and TR appears moderate.  With dilated RV, V/Q scan was done and was negative.  Biventricular failure.  Last echo in [place] per report showed EF 40-45%.  Cause of fall in EF uncertain, could be due to atrial fibrillation with RVR (tachycardia-mediated).  Apparently chronic AF but last clinic note from outpatient cardiologist outlines plan for possible atrial fibrillation ablation.  Myocarditis, potentially from COVID-19, is possible.  It is also possible that she has a stress cardiomyopathy due to medical illness/COVID-19 infection.  I have been concerned for low output HF with rise in creatinine and difficulty with diuresis.  Started milrinone at 0.25, not much change in HR (has been high, remained high).  Co-ox 76.5% today.  Creatinine mildly higher at 2.66 with stable BUN.  Diuresis better yesterday, CVP still > 20.  - Continue milrinone 0.25 mcg/kg/min.  - Need to push diuresis, continue Lasix 15 mg/hr and will give metolazone 5 mg bid today. Replace K.   2. Valvular heart disease: Patient was supposed to have mitral and tricuspid valve replacements this week.  TEE in 9/20 in [place] showed mod-severe MR and severe TR.  On my read of the echo done here, she has moderate MR and moderate TR (both central and likely functional).  Her main issue here seems to be biventricular failure and would hold off on valve  procedures for the time being.  3. Atrial fibrillation: Chronic per outpatient notes. Per daughter, she has had cardioversions in the past. There apparently has been consideration for atrial fibrillation ablation. She remains in RVR but rate better-controlled today (100s-110s).  - Continue amiodarone 60 mg/hr.  - Continue heparin gtt for now.  - Eventually I think she will need TEE-DCCV.  Would try to get her off milrinone first and will need to be more diuresed to have best chance to keep her in NSR.  Procedure will be complicated by [id number]   4. COVID-19 infection: No PNA on CXR.  On supplemental O2 but this may  be due to CHF. Seems to be doing ok from this standpoint.  5. AKI: Per report, baseline creatinine around 1.2, now up to 2.66.  Suspect primarily cardiorenal. Markedly volume overloaded with CVP >20.  - Trying to support cardiac output with milrinone.  - Continue to try to control HR.  - If creatinine worsens/inadequate diuresis, may end up needing CVVH.  - Nephrology following.   CRITICAL CARE Performed by: [name]  Total critical care time: 35 minutes  Critical care time was exclusive of separately billable procedures and treating other patients.  Critical care was necessary to treat or prevent imminent or life-threatening deterioration.  Critical care was time spent personally by me on the following activities: development of treatment plan with patient and/or surrogate as well as nursing, discussions with consultants, evaluation of patient's response to treatment, examination of patient, obtaining history from patient or surrogate, ordering and performing treatments and interventions, ordering and review of laboratory studies, ordering and review of radiographic studies, pulse oximetry and re-evaluation of patient's condition.   Length of Stay: 4  [name], MD  10/16/2019, 8:11 AM  Advanced Heart Failure Team Pager [phone] (M-F; 7a - 4p)  Please contact  [facility] for night-coverage after hours (4p -7a ) and weekends on [email or web address]

## 2019-10-16 NOTE — Progress Notes (Signed)
ANTICOAGULATION CONSULT NOTE  Pharmacy Consult for Heparin Indication: atrial fibrillation  No Known Allergies  Patient Measurements: Height: 5\' 3"  (160 cm) Weight: 258 lb 2.5 oz (117.1 kg) IBW/kg (Calculated) : 52.4 Heparin Dosing Weight: 79.8 kg  Vital Signs: BP: 97/60 (11/20 2300) Pulse Rate: 107 (11/20 2300)  Labs: Recent Labs    10/14/19 0059 10/14/19 1053  10/15/19 0548 10/15/19 1703 10/15/19 1705 10/16/19 0341  HGB 8.8*  --   --  7.8* 8.2*  --  8.1*  HCT 28.2*  --   --  25.9* 26.4*  --  26.6*  PLT 235  --   --  192  --   --  200  APTT 90* 79*  --  106*  --   --  56*  HEPARINUNFRC 1.54*  --   --  0.93*  --   --  0.39  CREATININE 2.09*  --    < > 2.41*  --  2.53* 2.66*   < > = values in this interval not displayed.    Estimated Creatinine Clearance: 23.6 mL/min (A) (by C-G formula based on SCr of 2.66 mg/dL (H)).   Medical History: Past Medical History:  Diagnosis Date  . A-fib ([place])   . Cardiomyopathy ([place])   . CHF (congestive heart failure) ([place])   . Edema   . Hypertension   . Pulmonary hypertension ([place])   . Sleep apnea   . Tricuspid regurgitation     Medications:  Scheduled:  . allopurinol  300 mg Oral Daily  . atorvastatin  40 mg Oral Daily  . Chlorhexidine Gluconate Cloth  6 each Topical Daily  . mouth rinse  15 mL Mouth Rinse BID  . multivitamin with minerals  1 tablet Oral Daily  . sodium chloride flush  10-40 mL Intracatheter Q12H  . sodium chloride flush  3 mL Intravenous Q12H  . vitamin C  500 mg Oral Daily  . zinc sulfate  220 mg Oral Daily    Assessment: 71 yof presenting with SOB and weight gain - on apixaban PTA, has been on hold for mitral/tricupsid valve replacement scheduled for 11/18 (last dose on 11/8). Also found to be COVID +. D-dimer elevated but trending down. LE dopplers and V/Q scan neg for PE/DVT.   APTT slightly subtherapeutic at 56 on drip rate 600 units/hr. Heparin level is therapeutic at 0.39, but is not  correlating with aPTT. Likely not impacted by apixaban if last dose was truly 11/8. CBC stable, no overt bleeding or infusion issues per RN. Given the large down trend will increase heparin drip slightly although heparin level is technically therapeutic now. Can likely start using HL alone without aPTTs after this.  Goal of Therapy:  Heparin level 0.3-0.7 units/ml  APTT 66-102 seconds Monitor platelets by anticoagulation protocol: Yes   Plan:  Increase heparin to 650 units/hr Daily aPTT, heparin level, CBC Monitor for signs of bleeding  Nicoletta Dress, PharmD PGY2 Infectious Disease Pharmacy Resident  Phone: [phone] 10/16/2019  7:17 AM  Please check [email or web address] for unit-specific pharmacy phone numbers.

## 2019-10-16 NOTE — Progress Notes (Signed)
PROGRESS NOTE    Melissa Adams  AJO:[id number] DOB: [birth year] DOA: 10/11/2019 PCP: System, Provider Not In   Brief Narrative:  73 year old female with history of heart failure, atrial fibrillation, hypertension, sleep apnea who presented with complaints of shortness of breath and weight gain.  Patient found to have an elevated BNP in the emergency department along with shortness of breath.  She was given IV Lasix.  Cardiology was consulted.  Of note patient found to be COVID +.   Assessment & Plan:   Active Problems:   Acute CHF (congestive heart failure) (HCC)   Atrial fibrillation, chronic (HCC)   Essential hypertension   Sleep apnea   AKI (acute kidney injury) ([place])   Gout   Normocytic anemia   CHF exacerbation ([name])   COVID positive -Covid tested returned positive -Patient does not seem to be symptomatic from Covid at this time however she does have shortness of breath which likely is related to her CHF exacerbation -D dimer remains elevated -She has been placed on precautions, airborne and contact. -Patient also given remdesivir and steroids - HF seems to be primary driver of sx - will d/c steroids, but will complete remdesivir (day 5 today).   COVID-19 Labs  Recent Labs    10/13/19 1114 10/14/19 0059 10/15/19 0548  DDIMER 11.89* 8.32* 3.45*  FERRITIN  --  10* 19  CRP  --  <0.8 <0.8    Lab Results  Component Value Date   SARSCOV2NAA POSITIVE (Anquan Azzarello) 10/12/2019   SARSCOV2NAA POSITIVE (Crescencio Jozwiak) 10/11/2019   Hypotension: concern for hypotension 11/21.  This resolved on repeat BP check.  BP appropriate today.  Acute systolic CHF exacerbation -Presented with dyspnea on exertion, 22 pound weight gain since September 20, 2019, lower extremity edema -Dry weight is approximately 226 pounds; on admission 249 pounds -BNP was noted to be 3374 on admission.  Chest x-ray did show small right pleural effusion. -On examination patient was hypervolemic with lower extremity edema and  dependent sacral edema. -Echocardiogram on August 05, 2019 noted an EF of 40 to 45% (obtain this by calling patient's cardiologist, Dr. Ervin Knack) -echo 11/17 20-25% EF, RV with reduced systolic function, IVC dilated with <50% resp variability (see report) -Will continue to give IV Lasix and monitor intake and output, daily weights - appreciate cardiology recommendations - fall in EF tachycardia mediated? Vs myocarditis? Vs stress cardiomyopathy.  Placing PICC to follow CVP (25 today) and co-ox (76).  Started on milrinone on 11/18.  Continue diuresis with lasix gtt.  Per cardiology.   - She was transferred to the ICU 11/20 for closer monitoring - Diuresis per cardiology  Acute kidney injury   Hyponatremia -Increased to 2.66 today - baseline 1.2 -Patient's creatinine September10,2020 was 1.2 (per previous PN - I don't see this in chart) -Nephrology consulted appreciated  -Renal US without hydro, noted R effusion pleural - suspect hyponatremia volume driven, follow  Elevated D-dimer - possibly secondary to COVID vs PE - Unfortunately cannot obtain CTA of the chest given creatinine.  - V/Q scan negative for PE, LE dopplers also negative (though report confusing, will discuss with vascular - discussed with vascular who confirmed no DVT) - continue heparin for now - previously on eliquis  Normocytic anemia   Iron Deficiency Anemia -Hb stable, follow - iron panel c/w IDA - normal B12 and folate  - IV iron ordered  Atrial fibrillation - Patient currently in atrial fibrillation with RVR (in 110's and 120's) - continue amiodarone per  cardiology -Eliquis was held as she was supposed to have surgery on 10/13/2019 formitral and tricuspid valve replacement - started on amiodarone per cards - metop and cardizem on hold due to bradycardia (cardizem should be d/c'd due to systolic HF) - plan for cardioversion eventually - but need TEE - pending off milrinone and additional diuresis - pt  also with COVID 19 infection  Essential hypertension -Metoprolol, Lasix per cards -Mavik was noted on her medication list however per daughter she has not been taking this  Sleep apnea - Patient states that she does use Marleny Faller CPAP however has not used it recently as it is broken and is being replaced currently - Given her Covid positive result, will discontinue CPAP  Valvular Heart disease - As above, patient is scheduled to have Lizann Edelman mitral and tricuspid valve replacement on 10/13/2019 in [place] - per cards, recommending holding off on procedure as main issue likely biventricular failure  Gout -Continue allopurinol -Colchicine held  DVT prophylaxis: heparin gtt Code Status: full  Family Communication: none at bedside Disposition Plan: pending further improvement   Consultants:   Cardiology  Nephrology  Procedures:  Echo IMPRESSIONS    1. Left ventricular ejection fraction, by visual estimation, is 20 to 25%. The left ventricle has severely decreased function. There is no left ventricular hypertrophy.  2. Left ventricular diastolic function could not be evaluated.  3. Mildly dilated left ventricular internal cavity size.  4. Global right ventricle has severely reduced systolic function.The right ventricular size is mildly enlarged.  5. Left atrial size was moderately dilated.  6. Right atrial size was severely dilated.  7. The mitral valve is abnormal. Mild to moderate mitral valve regurgitation. No evidence of mitral stenosis.  8. The tricuspid valve is normal in structure. Tricuspid valve regurgitation moderate.  9. The aortic valve is tricuspid. Aortic valve regurgitation is not visualized. Mild aortic valve sclerosis without stenosis. 10. The pulmonic valve was normal in structure. Pulmonic valve regurgitation is mild. 11. Moderately elevated pulmonary artery systolic pressure. 12. The inferior vena cava is dilated in size with <50% respiratory  variability, suggesting right atrial pressure of 15 mmHg. 13. Severe global reduction in LV systolic function; 4 chamber enlargement; mild to moderate MR; severely reduced RV function; moderate TR.  Antimicrobials:  Anti-infectives (From admission, onward)   Start     Dose/Rate Route Frequency Ordered Stop   10/15/19 1600  remdesivir 100 mg in sodium chloride 0.9 % 250 mL IVPB     100 mg 500 mL/hr over 30 Minutes Intravenous Every 24 hours 10/15/19 1020 10/17/19 1559   10/15/19 1200  remdesivir 100 mg in sodium chloride 0.9 % 250 mL IVPB  Status:  Discontinued     100 mg 500 mL/hr over 30 Minutes Intravenous Every 24 hours 10/15/19 1018 10/15/19 1020   10/13/19 1000  remdesivir 100 mg in sodium chloride 0.9 % 250 mL IVPB  Status:  Discontinued     100 mg 500 mL/hr over 30 Minutes Intravenous Every 24 hours 10/12/19 0600 10/14/19 1646   10/12/19 0700  remdesivir 200 mg in sodium chloride 0.9 % 250 mL IVPB     200 mg 500 mL/hr over 30 Minutes Intravenous Once 10/12/19 0600 10/12/19 0908     Subjective: Denies complaints Feeling well  Objective: Vitals:   10/15/19 2200 10/15/19 2300 10/16/19 0430 10/16/19 0700  BP: (!) 80/49 97/60  95/60  Pulse: (!) 114 (!) 107  (!) 127  Resp: 18 20  19  Temp:      TempSrc:      SpO2: 94% 94%  95%  Weight:   117.1 kg   Height:        Intake/Output Summary (Last 24 hours) at 10/16/2019 0820 Last data filed at 10/16/2019 0600 Gross per 24 hour  Intake 1931.23 ml  Output 3175 ml  Net -1243.77 ml   Filed Weights   10/14/19 1059 10/15/19 0449 10/16/19 0430  Weight: 113.4 kg 116.6 kg 117.1 kg    Examination:  General: No acute distress. Cardiovascular: RRR Lungs: unlabored. Abdomen: Soft, nontender, nondistended  Neurological: Alert and oriented 3. Moves all extremities 4. Cranial nerves II through XII grossly intact. Skin: Warm and dry. No rashes or lesions. Extremities: bilateral LE edema    Data Reviewed: I have personally  reviewed following labs and imaging studies  CBC: Recent Labs  Lab 10/12/19 0431 10/13/19 0335 10/14/19 0059 10/15/19 0548 10/15/19 1703 10/16/19 0341  WBC 5.3 4.2 7.6 8.3  --  6.8  NEUTROABS 3.2  --   --   --   --   --   HGB 9.2* 8.9* 8.8* 7.8* 8.2* 8.1*  HCT 30.6* 29.6* 28.2* 25.9* 26.4* 26.6*  MCV 78.7* 78.7* 76.8* 79.0*  --  77.8*  PLT 156 196 235 192  --  476   Basic Metabolic Panel: Recent Labs  Lab 10/14/19 0059 10/14/19 1431 10/15/19 0548 10/15/19 1705 10/16/19 0341  NA 132* 135 127* 128* 128*  K 4.0 3.7 3.6 3.9 3.7  CL 93* 92* 86* 90* 87*  CO2 26 28 26 24 27   GLUCOSE 130* 122* 204* 125* 110*  BUN 51* 52* 51* 52* 50*  CREATININE 2.09* 2.35* 2.41* 2.53* 2.66*  CALCIUM 9.2 9.2 8.7* 8.9 9.1  MG 2.4  --  2.3  --  2.5*  PHOS  --  3.7  --   --   --    GFR: Estimated Creatinine Clearance: 23.6 mL/min (Latrise [place]) (by C-G formula based on SCr of 2.66 mg/dL (H)). Liver Function Tests: Recent Labs  Lab 10/12/19 0433 10/13/19 0335 10/14/19 0059 10/14/19 1431 10/15/19 0548 10/16/19 0341  AST 68* 78* 79*  --  70* 64*  ALT 51* 61* 65*  --  63* 65*  ALKPHOS 181* 192* 178*  --  153* 164*  BILITOT 1.7* 2.2* 2.1*  --  1.9* 1.4*  PROT 6.5 6.8 6.8  --  6.2* 6.2*  ALBUMIN 3.6 3.5 3.7 3.4* 3.3* 3.2*   No results for input(s): LIPASE, AMYLASE in the last 168 hours. No results for input(s): AMMONIA in the last 168 hours. Coagulation Profile: Recent Labs  Lab 10/12/19 0431  INR 1.8*   Cardiac Enzymes: No results for input(s): CKTOTAL, CKMB, CKMBINDEX, TROPONINI in the last 168 hours. BNP (last 3 results) No results for input(s): PROBNP in the last 8760 hours. HbA1C: No results for input(s): HGBA1C in the last 72 hours. CBG: No results for input(s): GLUCAP in the last 168 hours. Lipid Profile: No results for input(s): CHOL, HDL, LDLCALC, TRIG, CHOLHDL, LDLDIRECT in the last 72 hours. Thyroid Function Tests: No results for input(s): TSH, T4TOTAL, FREET4, T3FREE,  THYROIDAB in the last 72 hours. Anemia Panel: Recent Labs    10/14/19 0059 10/14/19 1053 10/15/19 0548  VITAMINB12  --  2,376*  --   FOLATE  --  27.4  --   FERRITIN 10*  --  19  TIBC  --  564*  --   IRON  --  18*  --  Sepsis Labs: No results for input(s): PROCALCITON, LATICACIDVEN in the last 168 hours.  Recent Results (from the past 240 hour(s))  SARS CORONAVIRUS 2 (TAT 6-24 HRS) Nasopharyngeal Nasopharyngeal Swab     Status: Abnormal   Collection Time: 10/11/19  4:17 PM   Specimen: Nasopharyngeal Swab  Result Value Ref Range Status   SARS Coronavirus 2 POSITIVE (Charnise Lovan) NEGATIVE Final    Comment: RESULT CALLED TO, READ BACK BY AND VERIFIED WITH: Thora Scherman.KIRBY,RN 0215 10/12/19 G.MCADOO (NOTE) SARS-CoV-2 target nucleic acids are DETECTED. The SARS-CoV-2 RNA is generally detectable in upper and lower respiratory specimens during the acute phase of infection. Positive results are indicative of active infection with SARS-CoV-2. Clinical  correlation with patient history and other diagnostic information is necessary to determine patient infection status. Positive results do  not rule out bacterial infection or co-infection with other viruses. The expected result is Negative. Fact Sheet for Patients: SugarRoll.be Fact Sheet for Healthcare Providers: https://www.woods-mathews.com/ This test is not yet approved or cleared by the [place] FDA and  has been authorized for detection and/or diagnosis of SARS-CoV-2 by FDA under an Emergency Use Authorization (EUA). This EUA will remain  in effect (meaning this test can be used) for the  duration of the COVID-19 declaration under Section 564(b)(1) of the Act, 21 U.S.C. section 360bbb-3(b)(1), unless the authorization is terminated or revoked sooner. Performed at [facility], [place] [address], [place], [place] [address]   MRSA PCR Screening     Status: None   Collection Time: 10/11/19  8:44  PM   Specimen: Nasopharyngeal  Result Value Ref Range Status   MRSA by PCR NEGATIVE NEGATIVE Final    Comment:        The GeneXpert MRSA Assay (FDA approved for NASAL specimens only), is one component of Sharlette Jansma comprehensive MRSA colonization surveillance program. It is not intended to diagnose MRSA infection nor to guide or monitor treatment for MRSA infections. Performed at [facility], [place] [address] [place], [place] [address]   SARS CORONAVIRUS 2 (TAT 6-24 HRS) Nasopharyngeal Nasopharyngeal Swab     Status: Abnormal   Collection Time: 10/12/19  1:34 PM   Specimen: Nasopharyngeal Swab  Result Value Ref Range Status   SARS Coronavirus 2 POSITIVE (Aaryan Essman) NEGATIVE Final    Comment: RESULT CALLED TO, READ BACK BY AND VERIFIED WITH: J CRUISE,RN 1925 10/12/2019 D BRADLEY (NOTE) SARS-CoV-2 target nucleic acids are DETECTED. The SARS-CoV-2 RNA is generally detectable in upper and lower respiratory specimens during the acute phase of infection. Positive results are indicative of active infection with SARS-CoV-2. Clinical  correlation with patient history and other diagnostic information is necessary to determine patient infection status. Positive results do  not rule out bacterial infection or co-infection with other viruses. The expected result is Negative. Fact Sheet for Patients: SugarRoll.be Fact Sheet for Healthcare Providers: https://www.woods-mathews.com/ This test is not yet approved or cleared by the [place] FDA and  has been authorized for detection and/or diagnosis of SARS-CoV-2 by FDA under an Emergency Use Authorization (EUA). This EUA will remain  in effect (meaning this test can be used) for  the duration of the COVID-19 declaration under Section 564(b)(1) of the Act, 21 U.S.C. section 360bbb-3(b)(1), unless the authorization is terminated or revoked sooner. Performed at [facility], [place] [address],  [place], [place] [address]          Radiology Studies: Dg Chest Port 1 View  Result Date: 10/14/2019 CLINICAL DATA:  PICC  placement, COVID-19 EXAM: PORTABLE CHEST 1 VIEW COMPARISON:  10/11/2019 FINDINGS: Interval placement of left upper extremity PICC, tip positioned near the superior cavoatrial junction. Redemonstrated cardiomegaly. Probable hiatal hernia. No acute abnormality of the lungs. IMPRESSION: Interval placement of left upper extremity PICC, tip positioned near the superior cavoatrial junction. Electronically Signed   By: Eddie Candle M.D.   On: 10/14/2019 13:12   [place] Ekg Site Rite  Result Date: 10/14/2019 If Site Rite image not attached, placement could not be confirmed due to current cardiac rhythm.       Scheduled Meds:  allopurinol  300 mg Oral Daily   atorvastatin  40 mg Oral Daily   Chlorhexidine Gluconate Cloth  6 each Topical Daily   mouth rinse  15 mL Mouth Rinse BID   metolazone  5 mg Oral BID   multivitamin with minerals  1 tablet Oral Daily   potassium chloride  40 mEq Oral Once   sodium chloride flush  10-40 mL Intracatheter Q12H   sodium chloride flush  3 mL Intravenous Q12H   vitamin C  500 mg Oral Daily   zinc sulfate  220 mg Oral Daily   Continuous Infusions:  sodium chloride     amiodarone 60 mg/hr (10/16/19 0206)   ferumoxytol Stopped (10/14/19 1703)   furosemide (LASIX) infusion 15 mg/hr (10/16/19 0417)   heparin 650 Units/hr (10/16/19 0739)   milrinone 0.25 mcg/kg/min (10/15/19 2152)   norepinephrine (LEVOPHED) Adult infusion     remdesivir 100 mg in NS 250 mL Stopped (10/15/19 1904)     LOS: 4 days    Time spent: over 36 min    Fayrene Helper, MD [facility] Pager AMION  If 7PM-7AM, please contact night-coverage [email or web address] Password [email or web address] 10/16/2019, 8:20 AM

## 2019-10-16 NOTE — Progress Notes (Signed)
Orthopedic Tech Progress Note Patient Details:  Melissa Adams 25-Jul-1946 [id number] Applied unna boots to patient. RN said patient never had on any unna boots applied. Ortho Devices Type of Ortho Device: Haematologist Ortho Device/Splint Location: bilateral Ortho Device/Splint Interventions: Application, Ordered   Post Interventions Patient Tolerated: Well Instructions Provided: Care of device, Adjustment of device   Janit Pagan 10/16/2019, 11:23 AM

## 2019-10-16 NOTE — Progress Notes (Signed)
[facility] Progress Note    Assessment/ Plan:   1.  AKI on CKD 3: baseline Cr reportedly 1.2.  Likely d/t decompensated CHF.  UA with 6-10 RBCs and no WBCs, UP/C 0.2, not really reflective of GN or COVID AKI. Renal US neg.   Cr uptrending in setting of worsening hemodynamics, Afib, and low BP--> cardiogenic shock picture.  Diuresing well on Lasix gtt and milrinone, no indication for RRT yet. Cr 2.5--> 2.6.   Will closely follow  2.  Severe biventricular CHF: was due for MV and TV replacement in [place] on 11/18.  TTE performed--> Biventricular failure with EF 20-25%.  Advanced HF following, appreciate them  3.  COVID + : on supplemental O2 on hep gtt, getting remdesivir  4.  Afib; was on metop and eliquis as OP, now on amio gtt and hep gtt.  5.  Dispo: to ICU   Subjective:    Hemodynamics improved somewhat, diuresing well on Lasix gtt.  Cr up slightly from 2.5-2.6.  3.1L urine output.   Objective:   BP 95/60   Pulse (!) 127   Temp 98.1 F (36.7 C) (Core (Comment))   Resp 19   Ht 5\' 3"  (1.6 m)   Wt 117.1 kg   SpO2 95%   BMI 45.73 kg/m   Intake/Output Summary (Last 24 hours) at 10/16/2019 8786 Last data filed at 10/16/2019 0600 Gross per 24 hour  Intake 1811.23 ml  Output 2575 ml  Net -763.77 ml   Weight change: 3.7 kg  Physical Exam:  Physical exam not performed today to preserve PPE and limit exposure  Imaging: Dg Chest Port 1 View  Result Date: 10/14/2019 CLINICAL DATA:  PICC placement, COVID-19 EXAM: PORTABLE CHEST 1 VIEW COMPARISON:  10/11/2019 FINDINGS: Interval placement of left upper extremity PICC, tip positioned near the superior cavoatrial junction. Redemonstrated cardiomegaly. Probable hiatal hernia. No acute abnormality of the lungs. IMPRESSION: Interval placement of left upper extremity PICC, tip positioned near the superior cavoatrial junction. Electronically Signed   By: Eddie Candle M.D.   On: 10/14/2019 13:12   [place] Ekg Site  Rite  Result Date: 10/14/2019 If Site Rite image not attached, placement could not be confirmed due to current cardiac rhythm.   Labs: BMET Recent Labs  Lab 10/12/19 0433 10/13/19 0335 10/14/19 0059 10/14/19 1431 10/15/19 0548 10/15/19 1705 10/16/19 0341  NA 136 133* 132* 135 127* 128* 128*  K 3.9 4.2 4.0 3.7 3.6 3.9 3.7  CL 97* 94* 93* 92* 86* 90* 87*  CO2 22 23 26 28 26 24 27   GLUCOSE 105* 118* 130* 122* 204* 125* 110*  BUN 48* 50* 51* 52* 51* 52* 50*  CREATININE 1.97* 1.82* 2.09* 2.35* 2.41* 2.53* 2.66*  CALCIUM 9.2 9.5 9.2 9.2 8.7* 8.9 9.1  PHOS  --   --   --  3.7  --   --   --    CBC Recent Labs  Lab 10/12/19 0431 10/13/19 0335 10/14/19 0059 10/15/19 0548 10/15/19 1703 10/16/19 0341  WBC 5.3 4.2 7.6 8.3  --  6.8  NEUTROABS 3.2  --   --   --   --   --   HGB 9.2* 8.9* 8.8* 7.8* 8.2* 8.1*  HCT 30.6* 29.6* 28.2* 25.9* 26.4* 26.6*  MCV 78.7* 78.7* 76.8* 79.0*  --  77.8*  PLT 156 196 235 192  --  200    Medications:    . allopurinol  300 mg Oral Daily  . atorvastatin  40 mg Oral Daily  . Chlorhexidine Gluconate Cloth  6 each Topical Daily  . mouth rinse  15 mL Mouth Rinse BID  . metolazone  5 mg Oral BID  . multivitamin with minerals  1 tablet Oral Daily  . sodium chloride flush  10-40 mL Intracatheter Q12H  . sodium chloride flush  3 mL Intravenous Q12H  . vitamin C  500 mg Oral Daily  . zinc sulfate  220 mg Oral Daily      Madelon Lips, MD 10/16/2019, 9:43 AM

## 2019-10-17 DIAGNOSIS — N179 Acute kidney failure, unspecified: Secondary | ICD-10-CM | POA: Diagnosis not present

## 2019-10-17 DIAGNOSIS — I5043 Acute on chronic combined systolic (congestive) and diastolic (congestive) heart failure: Secondary | ICD-10-CM | POA: Diagnosis not present

## 2019-10-17 LAB — MAGNESIUM: Magnesium: 2.3 mg/dL (ref 1.7–2.4)

## 2019-10-17 LAB — CBC
HCT: 24.6 % — ABNORMAL LOW (ref 36.0–46.0)
Hemoglobin: 7.5 g/dL — ABNORMAL LOW (ref 12.0–15.0)
MCH: 23.9 pg — ABNORMAL LOW (ref 26.0–34.0)
MCHC: 30.5 g/dL (ref 30.0–36.0)
MCV: 78.3 fL — ABNORMAL LOW (ref 80.0–100.0)
Platelets: 200 10*3/uL (ref 150–400)
RBC: 3.14 MIL/uL — ABNORMAL LOW (ref 3.87–5.11)
RDW: 17.9 % — ABNORMAL HIGH (ref 11.5–15.5)
WBC: 6.6 10*3/uL (ref 4.0–10.5)
nRBC: 0.8 % — ABNORMAL HIGH (ref 0.0–0.2)

## 2019-10-17 LAB — COOXEMETRY PANEL
Carboxyhemoglobin: 2.1 % — ABNORMAL HIGH (ref 0.5–1.5)
Carboxyhemoglobin: 1.5 % (ref 0.5–1.5)
Methemoglobin: 1.4 % (ref 0.0–1.5)
Methemoglobin: 0.7 % (ref 0.0–1.5)
O2 Saturation: 99.1 %
O2 Saturation: 68.8 %
Total hemoglobin: 7.9 g/dL — ABNORMAL LOW (ref 12.0–16.0)
Total hemoglobin: 7.8 g/dL — ABNORMAL LOW (ref 12.0–16.0)

## 2019-10-17 LAB — BASIC METABOLIC PANEL
Anion gap: 16 — ABNORMAL HIGH (ref 5–15)
Anion gap: 15 (ref 5–15)
Anion gap: 15 (ref 5–15)
Anion gap: 14 (ref 5–15)
BUN: 48 mg/dL — ABNORMAL HIGH (ref 8–23)
BUN: 50 mg/dL — ABNORMAL HIGH (ref 8–23)
BUN: 53 mg/dL — ABNORMAL HIGH (ref 8–23)
BUN: 56 mg/dL — ABNORMAL HIGH (ref 8–23)
CO2: 27 mmol/L (ref 22–32)
CO2: 27 mmol/L (ref 22–32)
CO2: 28 mmol/L (ref 22–32)
CO2: 27 mmol/L (ref 22–32)
Calcium: 8.4 mg/dL — ABNORMAL LOW (ref 8.9–10.3)
Calcium: 8.4 mg/dL — ABNORMAL LOW (ref 8.9–10.3)
Calcium: 9.2 mg/dL (ref 8.9–10.3)
Calcium: 9.1 mg/dL (ref 8.9–10.3)
Chloride: 76 mmol/L — ABNORMAL LOW (ref 98–111)
Chloride: 78 mmol/L — ABNORMAL LOW (ref 98–111)
Chloride: 84 mmol/L — ABNORMAL LOW (ref 98–111)
Chloride: 84 mmol/L — ABNORMAL LOW (ref 98–111)
Creatinine, Ser: 2.63 mg/dL — ABNORMAL HIGH (ref 0.44–1.00)
Creatinine, Ser: 2.7 mg/dL — ABNORMAL HIGH (ref 0.44–1.00)
Creatinine, Ser: 2.91 mg/dL — ABNORMAL HIGH (ref 0.44–1.00)
Creatinine, Ser: 3.07 mg/dL — ABNORMAL HIGH (ref 0.44–1.00)
GFR calc Af Amer: 20 mL/min — ABNORMAL LOW (ref >60)
GFR calc Af Amer: 20 mL/min — ABNORMAL LOW (ref >60)
GFR calc Af Amer: 18 mL/min — ABNORMAL LOW (ref >60)
GFR calc Af Amer: 17 mL/min — ABNORMAL LOW (ref >60)
GFR calc non Af Amer: 17 mL/min — ABNORMAL LOW (ref >60)
GFR calc non Af Amer: 17 mL/min — ABNORMAL LOW (ref >60)
GFR calc non Af Amer: 15 mL/min — ABNORMAL LOW (ref >60)
GFR calc non Af Amer: 14 mL/min — ABNORMAL LOW (ref >60)
Glucose, Bld: 455 mg/dL — ABNORMAL HIGH (ref 70–99)
Glucose, Bld: 377 mg/dL — ABNORMAL HIGH (ref 70–99)
Glucose, Bld: 110 mg/dL — ABNORMAL HIGH (ref 70–99)
Glucose, Bld: 120 mg/dL — ABNORMAL HIGH (ref 70–99)
Potassium: 3.7 mmol/L (ref 3.5–5.1)
Potassium: 3.7 mmol/L (ref 3.5–5.1)
Potassium: 4.1 mmol/L (ref 3.5–5.1)
Potassium: 4 mmol/L (ref 3.5–5.1)
Sodium: 119 mmol/L — CL (ref 135–145)
Sodium: 120 mmol/L — ABNORMAL LOW (ref 135–145)
Sodium: 127 mmol/L — ABNORMAL LOW (ref 135–145)
Sodium: 125 mmol/L — ABNORMAL LOW (ref 135–145)

## 2019-10-17 LAB — GLUCOSE, CAPILLARY
Glucose-Capillary: 128 mg/dL — ABNORMAL HIGH (ref 70–99)
Glucose-Capillary: 388 mg/dL — ABNORMAL HIGH (ref 70–99)
Glucose-Capillary: 111 mg/dL — ABNORMAL HIGH (ref 70–99)
Glucose-Capillary: 119 mg/dL — ABNORMAL HIGH (ref 70–99)

## 2019-10-17 LAB — FERRITIN: Ferritin: 156 ng/mL (ref 11–307)

## 2019-10-17 LAB — APTT: aPTT: 92 seconds — ABNORMAL HIGH (ref 24–36)

## 2019-10-17 LAB — HEPARIN LEVEL (UNFRACTIONATED): Heparin Unfractionated: 0.34 IU/mL (ref 0.30–0.70)

## 2019-10-17 LAB — HEMOGLOBIN A1C
Hgb A1c MFr Bld: 6.6 % — ABNORMAL HIGH (ref 4.8–5.6)
Mean Plasma Glucose: 142.72 mg/dL

## 2019-10-17 LAB — D-DIMER, QUANTITATIVE: D-Dimer, Quant: 1.16 ug/mL-FEU — ABNORMAL HIGH (ref 0.00–0.50)

## 2019-10-17 LAB — C-REACTIVE PROTEIN: CRP: 3 mg/dL — ABNORMAL HIGH (ref <1.0)

## 2019-10-17 MED ORDER — INSULIN ASPART 100 UNIT/ML ~~LOC~~ SOLN: 0.0000 [IU] | Freq: Every day | SUBCUTANEOUS | Status: DC

## 2019-10-17 MED ORDER — TOLVAPTAN 15 MG PO TABS
30.0000 mg | ORAL_TABLET | Freq: Once | ORAL | Status: DC
  Filled 2019-10-17: qty 2

## 2019-10-17 MED ORDER — INSULIN ASPART 100 UNIT/ML ~~LOC~~ SOLN: 0.0000 [IU] | Freq: Three times a day (TID) | SUBCUTANEOUS | Status: DC

## 2019-10-17 MED ORDER — INSULIN ASPART 100 UNIT/ML ~~LOC~~ SOLN
0.0000 [IU] | Freq: Three times a day (TID) | SUBCUTANEOUS | Status: DC
  Administered 2019-10-18 – 2019-10-21 (×2): 1 [IU] via SUBCUTANEOUS

## 2019-10-17 MED ORDER — TOLVAPTAN 15 MG PO TABS: 30.0000 mg | ORAL_TABLET | ORAL | Status: DC

## 2019-10-17 MED ORDER — TOLVAPTAN 15 MG PO TABS
30.0000 mg | ORAL_TABLET | Freq: Once | ORAL | Status: AC
  Administered 2019-10-17: 30 mg via ORAL
  Filled 2019-10-17: qty 2

## 2019-10-17 NOTE — Progress Notes (Signed)
[facility] Progress Note    Assessment/ Plan:   1.  AKI on CKD 3: baseline Cr reportedly 1.2.  Likely d/t decompensated CHF.  UA with 6-10 RBCs and no WBCs, UP/C 0.2, not really reflective of GN or COVID AKI. Renal US neg.   Cr uptrending in setting of worsening hemodynamics, Afib, and low BP--> cardiogenic shock picture.  Didn't really diurese well yesterday despite being on milrinone and Lasix gtt.  D/w advanced HF- will do a dose of tolvaptan today and then if no improvement will proceed with CRRT tomorrow 11/23.  2.  Severe biventricular CHF: was due for MV and TV replacement in [place] on 11/18.  TTE performed--> Biventricular failure with EF 20-25%.  Advanced HF following, appreciate them on milrinone, Lasix gtt  3.  COVID + : on supplemental O2 on hep gtt, getting remdesivir  4.  Afib; was on metop and eliquis as OP, now on amio gtt and hep gtt.  5.  Hyponatremia: marked change from yesterday--> going to get tolvaptan and assess diuresis  6.  Dispo: to ICU   Subjective:    .Creatinine is about the same, Na down to 119 and repeat 120--> both with high blood glucoses.     Objective:   BP (!) 98/59   Pulse (!) 105   Temp 98.1 F (36.7 C) (Oral)   Resp 18   Ht 5\' 3"  (1.6 m)   Wt 117.6 kg   SpO2 95%   BMI 45.93 kg/m   Intake/Output Summary (Last 24 hours) at 10/17/2019 0859 Last data filed at 10/17/2019 0800 Gross per 24 hour  Intake 1773.32 ml  Output 1575 ml  Net 198.32 ml   Weight change: 0.5 kg  Physical Exam:   GEN: lying in bed, appears ill.  Can see anasarca HR OK on monitor Breathing nonlabored, on [place] O2  Imaging: No results found.  Labs: BMET Recent Labs  Lab 10/14/19 0059 10/14/19 1431 10/15/19 0548 10/15/19 1705 10/16/19 0341 10/17/19 0519 10/17/19 0817  NA 132* 135 127* 128* 128* 119* 120*  K 4.0 3.7 3.6 3.9 3.7 3.7 3.7  CL 93* 92* 86* 90* 87* 76* 78*  CO2 26 28 26 24 27 27 27   GLUCOSE 130* 122* 204* 125* 110* 455* 377*   BUN 51* 52* 51* 52* 50* 48* 50*  CREATININE 2.09* 2.35* 2.41* 2.53* 2.66* 2.63* 2.70*  CALCIUM 9.2 9.2 8.7* 8.9 9.1 8.4* 8.4*  PHOS  --  3.7  --   --   --   --   --    CBC Recent Labs  Lab 10/12/19 0431  10/14/19 0059 10/15/19 0548 10/15/19 1703 10/16/19 0341 10/17/19 0519  WBC 5.3   < > 7.6 8.3  --  6.8 6.6  NEUTROABS 3.2  --   --   --   --   --   --   HGB 9.2*   < > 8.8* 7.8* 8.2* 8.1* 7.5*  HCT 30.6*   < > 28.2* 25.9* 26.4* 26.6* 24.6*  MCV 78.7*   < > 76.8* 79.0*  --  77.8* 78.3*  PLT 156   < > 235 192  --  200 200   < > = values in this interval not displayed.    Medications:    . allopurinol  300 mg Oral Daily  . atorvastatin  40 mg Oral Daily  . Chlorhexidine Gluconate Cloth  6 each Topical Daily  . insulin aspart  0-15 Units Subcutaneous TID WC  .  insulin aspart  0-5 Units Subcutaneous QHS  . mouth rinse  15 mL Mouth Rinse BID  . multivitamin with minerals  1 tablet Oral Daily  . sodium chloride flush  10-40 mL Intracatheter Q12H  . sodium chloride flush  3 mL Intravenous Q12H  . vitamin C  500 mg Oral Daily  . zinc sulfate  220 mg Oral Daily      Madelon Lips, MD 10/17/2019, 8:59 AM

## 2019-10-17 NOTE — Progress Notes (Addendum)
PROGRESS NOTE    Melissa Adams  IRC:[id number] DOB: 11/08/1946 DOA: 10/11/2019 PCP: System, Provider Not In   Brief Narrative:  73 year old female with history of heart failure, atrial fibrillation, hypertension, sleep apnea who presented with complaints of shortness of breath and weight gain.  Patient found to have an elevated BNP in the emergency department along with shortness of breath.  She was given IV Lasix.  Cardiology was consulted.  Of note patient found to be COVID +.   Assessment & Plan:   Active Problems:   Acute CHF (congestive heart failure) (HCC)   Atrial fibrillation, chronic (HCC)   Essential hypertension   Sleep apnea   AKI (acute kidney injury) ([place])   Gout   Normocytic anemia   CHF exacerbation (HCC)   COVID positive -Covid tested returned positive -Patient does not seem to be symptomatic from Covid at this time however she does have shortness of breath which likely is related to her CHF exacerbation -D dimer remains elevated -She has been placed on precautions, airborne and contact. -Patient also given remdesivir and steroids - HF seems to be primary driver of sx - will d/c steroids, but will complete remdesivir (s/p 5 days).   COVID-19 Labs  Recent Labs    10/15/19 0548 10/17/19 0519  DDIMER 3.45* 1.16*  FERRITIN 19 156  CRP <0.8 3.0*    Lab Results  Component Value Date   SARSCOV2NAA POSITIVE (Nora Sabey) 10/12/2019   SARSCOV2NAA POSITIVE (Shante Archambeault) 10/11/2019   Hypotension: concern for hypotension 11/21.  This resolved on repeat BP check.  BP on the low side today.  Will follow closely.  Has order for levophed as needed.  Acute systolic CHF exacerbation -Presented with dyspnea on exertion, 22 pound weight gain since September 20, 2019, lower extremity edema -Dry weight is approximately 226 pounds; on admission 249 pounds -BNP was noted to be 3374 on admission.  Chest x-ray did show small right pleural effusion. -On examination patient was hypervolemic  with lower extremity edema and dependent sacral edema. -Echocardiogram on August 05, 2019 noted an EF of 40 to 45% (obtain this by calling patient's cardiologist, Dr. Ervin Knack) -echo 11/17 20-25% EF, RV with reduced systolic function, IVC dilated with <50% resp variability (see report) -Will continue to give IV Lasix and monitor intake and output, daily weights - appreciate cardiology recommendations - fall in EF tachycardia mediated? Vs myocarditis? Vs stress cardiomyopathy.  Placing PICC to follow CVP (remains elevated today) and co-ox (68.8).  Started on milrinone on 11/18.  Continue diuresis with lasix gtt.  Per cardiology.  1.6 L out yesterday. - She was transferred to the ICU 11/20 for closer monitoring - Diuresis per cardiology  Acute kidney injury   Hyponatremia -Increased to 2.91 today - baseline 1.2 -hyponatremia was notably worse this morning, but I think this was lab error in setting of being drawn off line with dextrose (hyponatremia corrected to about the same as previous values when corrected for hyponatremia) -repeat sodium, slightly lower compared to 11/21 -> receiving tolvaptan per cardiology -Patient's creatinine September10,2020 was 1.2 (per previous PN - I don't see this in chart) -Nephrology consulted appreciated - plan for CRRT 11/23 if no improvement -Renal US without hydro, noted R effusion pleural -suspect hyponatremia volume driven, follow  Elevated D-dimer - possibly secondary to COVID vs PE - Unfortunately cannot obtain CTA of the chest given creatinine.  - V/Q scan negative for PE, LE dopplers also negative (though report confusing, will discuss with vascular -  discussed with vascular who confirmed no DVT) - continue heparin for now - previously on eliquis  Normocytic anemia   Iron Deficiency Anemia -Hb stable, follow - iron panel c/w IDA - normal B12 and folate  - IV iron ordered  Atrial fibrillation - Patient currently in atrial fibrillation  with persistent RVR - continue amiodarone per cardiology -Eliquis was held as she was supposed to have surgery on 10/13/2019 formitral and tricuspid valve replacement - started on amiodarone per cards - metop and cardizem on hold due to bradycardia (cardizem should be d/c'd due to systolic HF) - plan for cardioversion eventually - but need TEE - pending off milrinone and additional diuresis - pt also with COVID 19 infection  Essential hypertension -Metoprolol on hold, Lasix per cards -Roberts Gaudy was noted on her medication list however per daughter she has not been taking this  Sleep apnea - Patient states that she does use Orion Vandervort CPAP however has not used it recently as it is broken and is being replaced currently - Given her Covid positive result, will discontinue CPAP  Valvular Heart disease - As above, patient is scheduled to have Garrus Gauthreaux mitral and tricuspid valve replacement on 10/13/2019 in [place] - per cards, recommending holding off on procedure as main issue likely biventricular failure  Gout -Continue allopurinol -Colchicine held  Elevated LFTs: likely related to HF above  T2DM: A1c 6.6.  Start SSI.  DVT prophylaxis: heparin gtt Code Status: full  Family Communication: none at bedside Disposition Plan: pending further improvement   Consultants:   Cardiology  Nephrology  Procedures:  Echo IMPRESSIONS    1. Left ventricular ejection fraction, by visual estimation, is 20 to 25%. The left ventricle has severely decreased function. There is no left ventricular hypertrophy.  2. Left ventricular diastolic function could not be evaluated.  3. Mildly dilated left ventricular internal cavity size.  4. Global right ventricle has severely reduced systolic function.The right ventricular size is mildly enlarged.  5. Left atrial size was moderately dilated.  6. Right atrial size was severely dilated.  7. The mitral valve is abnormal. Mild to moderate mitral valve  regurgitation. No evidence of mitral stenosis.  8. The tricuspid valve is normal in structure. Tricuspid valve regurgitation moderate.  9. The aortic valve is tricuspid. Aortic valve regurgitation is not visualized. Mild aortic valve sclerosis without stenosis. 10. The pulmonic valve was normal in structure. Pulmonic valve regurgitation is mild. 11. Moderately elevated pulmonary artery systolic pressure. 12. The inferior vena cava is dilated in size with <50% respiratory variability, suggesting right atrial pressure of 15 mmHg. 13. Severe global reduction in LV systolic function; 4 chamber enlargement; mild to moderate MR; severely reduced RV function; moderate TR.  Antimicrobials:  Anti-infectives (From admission, onward)   Start     Dose/Rate Route Frequency Ordered Stop   10/15/19 1600  remdesivir 100 mg in sodium chloride 0.9 % 250 mL IVPB     100 mg 500 mL/hr over 30 Minutes Intravenous Every 24 hours 10/15/19 1020 10/16/19 1737   10/15/19 1200  remdesivir 100 mg in sodium chloride 0.9 % 250 mL IVPB  Status:  Discontinued     100 mg 500 mL/hr over 30 Minutes Intravenous Every 24 hours 10/15/19 1018 10/15/19 1020   10/13/19 1000  remdesivir 100 mg in sodium chloride 0.9 % 250 mL IVPB  Status:  Discontinued     100 mg 500 mL/hr over 30 Minutes Intravenous Every 24 hours 10/12/19 0600 10/14/19 1646   10/12/19  0700  remdesivir 200 mg in sodium chloride 0.9 % 250 mL IVPB     200 mg 500 mL/hr over 30 Minutes Intravenous Once 10/12/19 0600 10/12/19 0908     Subjective: No complaints this AM  Objective: Vitals:   10/17/19 0600 10/17/19 0630 10/17/19 0830 10/17/19 0900  BP: 99/63 (!) 106/53 (!) 98/59 106/66  Pulse: (!) 105 100 (!) 105 (!) 141  Resp: 17 16 18  (!) 22  Temp:   98.1 F (36.7 C)   TempSrc:   Oral   SpO2: 94% 91% 95% 91%  Weight:      Height:        Intake/Output Summary (Last 24 hours) at 10/17/2019 1304 Last data filed at 10/17/2019 0800 Gross per 24 hour  Intake  1456.78 ml  Output 1575 ml  Net -118.22 ml   Filed Weights   10/15/19 0449 10/16/19 0430 10/17/19 0500  Weight: 116.6 kg 117.1 kg 117.6 kg    Examination:  General: No acute distress. Cardiovascular: irregularly irregular, tachy Lungs: unlabored Abdomen: Soft, nontender, nondistended  Neurological: Alert and oriented 3. Moves all extremities 4. Cranial nerves II through XII grossly intact. Skin: Warm and dry. No rashes or lesions. Extremities: No clubbing or cyanosis. Bilateral LE edema     Data Reviewed: I have personally reviewed following labs and imaging studies  CBC: Recent Labs  Lab 10/12/19 0431 10/13/19 0335 10/14/19 0059 10/15/19 0548 10/15/19 1703 10/16/19 0341 10/17/19 0519  WBC 5.3 4.2 7.6 8.3  --  6.8 6.6  NEUTROABS 3.2  --   --   --   --   --   --   HGB 9.2* 8.9* 8.8* 7.8* 8.2* 8.1* 7.5*  HCT 30.6* 29.6* 28.2* 25.9* 26.4* 26.6* 24.6*  MCV 78.7* 78.7* 76.8* 79.0*  --  77.8* 78.3*  PLT 156 196 235 192  --  200 213   Basic Metabolic Panel: Recent Labs  Lab 10/14/19 0059 10/14/19 1431 10/15/19 0548 10/15/19 1705 10/16/19 0341 10/17/19 0519 10/17/19 0817 10/17/19 1005  NA 132* 135 127* 128* 128* 119* 120* 127*  K 4.0 3.7 3.6 3.9 3.7 3.7 3.7 4.1  CL 93* 92* 86* 90* 87* 76* 78* 84*  CO2 26 28 26 24 27 27 27 28   GLUCOSE 130* 122* 204* 125* 110* 455* 377* 110*  BUN 51* 52* 51* 52* 50* 48* 50* 53*  CREATININE 2.09* 2.35* 2.41* 2.53* 2.66* 2.63* 2.70* 2.91*  CALCIUM 9.2 9.2 8.7* 8.9 9.1 8.4* 8.4* 9.2  MG 2.4  --  2.3  --  2.5* 2.3  --   --   PHOS  --  3.7  --   --   --   --   --   --    GFR: Estimated Creatinine Clearance: 21.7 mL/min (Lynnette Pote) (by C-G formula based on SCr of 2.91 mg/dL (H)). Liver Function Tests: Recent Labs  Lab 10/12/19 0433 10/13/19 0335 10/14/19 0059 10/14/19 1431 10/15/19 0548 10/16/19 0341  AST 68* 78* 79*  --  70* 64*  ALT 51* 61* 65*  --  63* 65*  ALKPHOS 181* 192* 178*  --  153* 164*  BILITOT 1.7* 2.2* 2.1*  --   1.9* 1.4*  PROT 6.5 6.8 6.8  --  6.2* 6.2*  ALBUMIN 3.6 3.5 3.7 3.4* 3.3* 3.2*   No results for input(s): LIPASE, AMYLASE in the last 168 hours. No results for input(s): AMMONIA in the last 168 hours. Coagulation Profile: Recent Labs  Lab 10/12/19 0431  INR 1.8*  Cardiac Enzymes: No results for input(s): CKTOTAL, CKMB, CKMBINDEX, TROPONINI in the last 168 hours. BNP (last 3 results) No results for input(s): PROBNP in the last 8760 hours. HbA1C: Recent Labs    10/17/19 0817  HGBA1C 6.6*   CBG: Recent Labs  Lab 10/17/19 0830  GLUCAP 388*   Lipid Profile: No results for input(s): CHOL, HDL, LDLCALC, TRIG, CHOLHDL, LDLDIRECT in the last 72 hours. Thyroid Function Tests: No results for input(s): TSH, T4TOTAL, FREET4, T3FREE, THYROIDAB in the last 72 hours. Anemia Panel: Recent Labs    10/15/19 0548 10/17/19 0519  FERRITIN 19 156   Sepsis Labs: No results for input(s): PROCALCITON, LATICACIDVEN in the last 168 hours.  Recent Results (from the past 240 hour(s))  SARS CORONAVIRUS 2 (TAT 6-24 HRS) Nasopharyngeal Nasopharyngeal Swab     Status: Abnormal   Collection Time: 10/11/19  4:17 PM   Specimen: Nasopharyngeal Swab  Result Value Ref Range Status   SARS Coronavirus 2 POSITIVE (Jacynda Brunke) NEGATIVE Final    Comment: RESULT CALLED TO, READ BACK BY AND VERIFIED WITH: Johnmatthew Solorio.KIRBY,RN 0215 10/12/19 G.MCADOO (NOTE) SARS-CoV-2 target nucleic acids are DETECTED. The SARS-CoV-2 RNA is generally detectable in upper and lower respiratory specimens during the acute phase of infection. Positive results are indicative of active infection with SARS-CoV-2. Clinical  correlation with patient history and other diagnostic information is necessary to determine patient infection status. Positive results do  not rule out bacterial infection or co-infection with other viruses. The expected result is Negative. Fact Sheet for Patients: SugarRoll.be Fact Sheet for  Healthcare Providers: https://www.woods-mathews.com/ This test is not yet approved or cleared by the [place] FDA and  has been authorized for detection and/or diagnosis of SARS-CoV-2 by FDA under an Emergency Use Authorization (EUA). This EUA will remain  in effect (meaning this test can be used) for the  duration of the COVID-19 declaration under Section 564(b)(1) of the Act, 21 U.S.C. section 360bbb-3(b)(1), unless the authorization is terminated or revoked sooner. Performed at [facility], [place] [address], [place], [place] [address]   MRSA PCR Screening     Status: None   Collection Time: 10/11/19  8:44 PM   Specimen: Nasopharyngeal  Result Value Ref Range Status   MRSA by PCR NEGATIVE NEGATIVE Final    Comment:        The GeneXpert MRSA Assay (FDA approved for NASAL specimens only), is one component of Breylin Dom comprehensive MRSA colonization surveillance program. It is not intended to diagnose MRSA infection nor to guide or monitor treatment for MRSA infections. Performed at [facility], [place] [address], [place], [place] [address]   SARS CORONAVIRUS 2 (TAT 6-24 HRS) Nasopharyngeal Nasopharyngeal Swab     Status: Abnormal   Collection Time: 10/12/19  1:34 PM   Specimen: Nasopharyngeal Swab  Result Value Ref Range Status   SARS Coronavirus 2 POSITIVE (Jamaurie Bernier) NEGATIVE Final    Comment: RESULT CALLED TO, READ BACK BY AND VERIFIED WITH: J CRUISE,RN 1925 10/12/2019 D BRADLEY (NOTE) SARS-CoV-2 target nucleic acids are DETECTED. The SARS-CoV-2 RNA is generally detectable in upper and lower respiratory specimens during the acute phase of infection. Positive results are indicative of active infection with SARS-CoV-2. Clinical  correlation with patient history and other diagnostic information is necessary to determine patient infection status. Positive results do  not rule out bacterial infection or co-infection with other viruses. The expected result is  Negative. Fact Sheet for Patients: SugarRoll.be Fact Sheet for Healthcare Providers: https://www.woods-mathews.com/ This test is  not yet approved or cleared by the [place] and  has been authorized for detection and/or diagnosis of SARS-CoV-2 by FDA under an Emergency Use Authorization (EUA). This EUA will remain  in effect (meaning this test can be used) for  the duration of the COVID-19 declaration under Section 564(b)(1) of the Act, 21 U.S.C. section 360bbb-3(b)(1), unless the authorization is terminated or revoked sooner. Performed at [facility], [place] [address] [place], [place] [address]          Radiology Studies: No results found.      Scheduled Meds:  allopurinol  300 mg Oral Daily   atorvastatin  40 mg Oral Daily   Chlorhexidine Gluconate Cloth  6 each Topical Daily   insulin aspart  0-5 Units Subcutaneous QHS   insulin aspart  0-9 Units Subcutaneous TID WC   mouth rinse  15 mL Mouth Rinse BID   multivitamin with minerals  1 tablet Oral Daily   sodium chloride flush  10-40 mL Intracatheter Q12H   sodium chloride flush  3 mL Intravenous Q12H   vitamin C  500 mg Oral Daily   zinc sulfate  220 mg Oral Daily   Continuous Infusions:  sodium chloride     amiodarone 60 mg/hr (10/17/19 0800)   ferumoxytol Stopped (10/14/19 1703)   furosemide (LASIX) infusion 20 mg/hr (10/17/19 0802)   heparin 650 Units/hr (10/17/19 0800)   milrinone 0.25 mcg/kg/min (10/17/19 0844)   norepinephrine (LEVOPHED) Adult infusion       LOS: 5 days    Time spent: over 30 min    Fayrene Helper, MD [facility] Pager AMION  If 7PM-7AM, please contact night-coverage [email or web address] Password [facility] 10/17/2019, 1:04 PM

## 2019-10-17 NOTE — Progress Notes (Addendum)
Patient ID: Melissa Adams, female   DOB: 06/17/1946, 73 y.o.   MRN: [id number]     Advanced Heart Failure Rounding Note  PCP-Cardiologist: No primary care provider on file.   Subjective:    UOP not vigorous yesterday but BUN/creatinine stable at 48/2.63.  Remains in atrial fibrillation but rate lower 90s-110s.  SBP also 90s-110s.  She remains on amiodarone gtt 60 mg/hr, milrinone 0.25 mcg/kg/min, and Lasix 15 mg/hr.  CVP remains > 20.  Co-ox inaccurate.   This morning Na down to 119, ?lab error with high glucose.   Objective:   Weight Range: 117.6 kg Body mass index is 45.93 kg/m.   Vital Signs:   Temp:  [97.5 F (36.4 C)-98.6 F (37 C)] 98.1 F (36.7 C) (11/22 0032) Pulse Rate:  [81-202] 100 (11/22 0630) Resp:  [14-21] 16 (11/22 0630) BP: (84-124)/(45-94) 106/53 (11/22 0630) SpO2:  [81 %-96 %] 91 % (11/22 0630) Weight:  [117.6 kg] 117.6 kg (11/22 0500) Last BM Date: (PTA)  Weight change: Filed Weights   10/15/19 0449 10/16/19 0430 10/17/19 0500  Weight: 116.6 kg 117.1 kg 117.6 kg    Intake/Output:   Intake/Output Summary (Last 24 hours) at 10/17/2019 0741 Last data filed at 10/17/2019 0600 Gross per 24 hour  Intake 1708.93 ml  Output 1600 ml  Net 108.93 ml      Physical Exam    General: NAD Neck: JVP 16+, no thyromegaly or thyroid nodule.  Lungs: Clear to auscultation bilaterally with normal respiratory effort. CV: Nondisplaced PMI.  Heart mildly tachy, irregular S1/S2, no S3/S4, 2/6 HSM LLSB.  Lower legs wrapped.  Abdomen: Soft, nontender, no hepatosplenomegaly, no distention.  Skin: Intact without lesions or rashes.  Neurologic: Alert and oriented x 3.  Psych: Normal affect. Extremities: No clubbing or cyanosis.  HEENT: Normal.    Telemetry   Atrial fibrillation rate 100s (personally reviewed).   Labs    CBC Recent Labs    10/16/19 0341 10/17/19 0519  WBC 6.8 6.6  HGB 8.1* 7.5*  HCT 26.6* 24.6*  MCV 77.8* 78.3*  PLT 200 400   Basic  Metabolic Panel Recent Labs    10/14/19 1431  10/16/19 0341 10/17/19 0519  NA 135   < > 128* 119*  K 3.7   < > 3.7 3.7  CL 92*   < > 87* 76*  CO2 28   < > 27 27  GLUCOSE 122*   < > 110* 455*  BUN 52*   < > 50* 48*  CREATININE 2.35*   < > 2.66* 2.63*  CALCIUM 9.2   < > 9.1 8.4*  MG  --    < > 2.5* 2.3  PHOS 3.7  --   --   --    < > = values in this interval not displayed.   Liver Function Tests Recent Labs    10/15/19 0548 10/16/19 0341  AST 70* 64*  ALT 63* 65*  ALKPHOS 153* 164*  BILITOT 1.9* 1.4*  PROT 6.2* 6.2*  ALBUMIN 3.3* 3.2*   No results for input(s): LIPASE, AMYLASE in the last 72 hours. Cardiac Enzymes No results for input(s): CKTOTAL, CKMB, CKMBINDEX, TROPONINI in the last 72 hours.  BNP: BNP (last 3 results) Recent Labs    10/11/19 1309  BNP 3,374.7*    ProBNP (last 3 results) No results for input(s): PROBNP in the last 8760 hours.   D-Dimer Recent Labs    10/15/19 0548 10/17/19 0519  DDIMER 3.45* 1.16*   Hemoglobin  A1C No results for input(s): HGBA1C in the last 72 hours. Fasting Lipid Panel No results for input(s): CHOL, HDL, LDLCALC, TRIG, CHOLHDL, LDLDIRECT in the last 72 hours. Thyroid Function Tests No results for input(s): TSH, T4TOTAL, T3FREE, THYROIDAB in the last 72 hours.  Invalid input(s): FREET3  Other results:   Imaging    No results found.   Medications:     Scheduled Medications:  allopurinol  300 mg Oral Daily   atorvastatin  40 mg Oral Daily   Chlorhexidine Gluconate Cloth  6 each Topical Daily   mouth rinse  15 mL Mouth Rinse BID   multivitamin with minerals  1 tablet Oral Daily   sodium chloride flush  10-40 mL Intracatheter Q12H   sodium chloride flush  3 mL Intravenous Q12H   tolvaptan  30 mg Oral Once   vitamin C  500 mg Oral Daily   zinc sulfate  220 mg Oral Daily    Infusions:  sodium chloride     amiodarone 60 mg/hr (10/17/19 0600)   ferumoxytol Stopped (10/14/19 1703)    furosemide (LASIX) infusion 15 mg/hr (10/17/19 0600)   heparin 650 Units/hr (10/17/19 0600)   milrinone 0.25 mcg/kg/min (10/17/19 0600)   norepinephrine (LEVOPHED) Adult infusion      PRN Medications: sodium chloride, acetaminophen, cyclobenzaprine, ondansetron (ZOFRAN) IV, sodium chloride flush, sodium chloride flush, traMADol   Assessment/Plan   1. Acute on chronic systolic CHF:  Nonischemic cardiomyopathy, cath in [place] in 9/20 with no significant coronary disease.  Echo with EF 20-25% with dilated and severely dysfunctional RV with D-shaped interventricular septum.  MR appears moderate and TR appears moderate.  With dilated RV, V/Q scan was done and was negative.  Biventricular failure.  Last echo in [place] per report showed EF 40-45%.  Cause of fall in EF uncertain, could be due to atrial fibrillation with RVR (tachycardia-mediated).  Apparently chronic AF but last clinic note from outpatient cardiologist outlines plan for possible atrial fibrillation ablation.  Myocarditis, potentially from COVID-19, is possible.  It is also possible that she has a stress cardiomyopathy due to medical illness/COVID-19 infection.  I have been concerned for low output HF with rise in creatinine and difficulty with diuresis.  Started milrinone at 0.25, HR generally in 100s currently.  Co-ox has been good but inaccurate this morning.  Creatinine and BUN stable this morning, 48/2.63.  Unfortunately, diuresis was not vigorous yesterday, I/Os essentially even and bed weight is up. Now she is also markedly hyponatremic but may be lab error.  - Continue milrinone 0.25 mcg/kg/min.  - Increase Lasix to 20 mg/hr.  - No metolazone yet until repeat BMET - draw stat (would not give metolazone with marked hyponatremia).  May end up giving her tolvaptan if sodium is around 125. 2. Valvular heart disease: Patient was supposed to have mitral and tricuspid valve replacements this week.  TEE in 9/20 in [place] showed mod-severe  MR and severe TR.  On my read of the echo done here, she has moderate MR and moderate TR (both central and likely functional).  Her main issue here seems to be biventricular failure and would hold off on valve procedures for the time being.  3. Atrial fibrillation: Chronic per outpatient notes. Per daughter, she has had cardioversions in the past. There apparently has been consideration for atrial fibrillation ablation. She remains in RVR but rate better-controlled today (100s this morning).  - Continue amiodarone 60 mg/hr.  - Continue heparin gtt for now.  - Eventually I  think she will need TEE-DCCV.  Would try to get her off milrinone first and will need to be more diuresed to have best chance to keep her in NSR.  Procedure will be complicated by [id number]+.   4. COVID-19 infection: No PNA on CXR.  On supplemental O2 but this may be due to CHF. Seems to be doing ok from this standpoint. Finished remdesivir.  5. AKI: Per report, baseline creatinine around 1.2, now up to 2.63 but stable over the last day.  Suspect primarily cardiorenal. Markedly volume overloaded with CVP >20.  - Trying to support cardiac output with milrinone.  - Continue to try to control HR.  - Stabilized creatinine but still not adequately dealing with volume overload.  Will need to start thinking about CVVH for volume removal if we continue to struggle.  - Nephrology following.   Length of Stay: Media, MD  10/17/2019, 7:41 AM  Advanced Heart Failure Team Pager [phone] (M-F; 7a - 4p)  Please contact [facility] for night-coverage after hours (4p -7a ) and weekends on [email or web address]  Repeat Na 120.  Discussed with Dr. Hollie Salk, will give her a dose of tolvaptan 30 mg x 1 today in addition to increased Lasix.  If diuresis continues to be poor, she will need CRRT tomorrow.   [name] 10/17/2019 9:04 AM

## 2019-10-17 NOTE — Progress Notes (Signed)
ANTICOAGULATION CONSULT NOTE  Pharmacy Consult for Heparin Indication: atrial fibrillation  No Known Allergies  Patient Measurements: Height: 5\' 3"  (160 cm) Weight: 259 lb 4.2 oz (117.6 kg) IBW/kg (Calculated) : 52.4 Heparin Dosing Weight: 79.8 kg  Vital Signs: Temp: 98.1 F (36.7 C) (11/22 0032) Temp Source: Oral (11/22 0032) BP: 106/53 (11/22 0630) Pulse Rate: 100 (11/22 0630)  Labs: Recent Labs    10/15/19 0548 10/15/19 1703 10/15/19 1705 10/16/19 0341 10/17/19 0519 10/17/19 0520  HGB 7.8* 8.2*  --  8.1* 7.5*  --   HCT 25.9* 26.4*  --  26.6* 24.6*  --   PLT 192  --   --  200 200  --   APTT 106*  --   --  56* 92*  --   HEPARINUNFRC 0.93*  --   --  0.39  --  0.34  CREATININE 2.41*  --  2.53* 2.66* 2.63*  --     Estimated Creatinine Clearance: 24 mL/min (A) (by C-G formula based on SCr of 2.63 mg/dL (H)).   Medical History: Past Medical History:  Diagnosis Date  . A-fib ([place])   . Cardiomyopathy ([place])   . CHF (congestive heart failure) ([place])   . Edema   . Hypertension   . Pulmonary hypertension ([place])   . Sleep apnea   . Tricuspid regurgitation     Medications:  Scheduled:  . allopurinol  300 mg Oral Daily  . atorvastatin  40 mg Oral Daily  . Chlorhexidine Gluconate Cloth  6 each Topical Daily  . mouth rinse  15 mL Mouth Rinse BID  . multivitamin with minerals  1 tablet Oral Daily  . sodium chloride flush  10-40 mL Intracatheter Q12H  . sodium chloride flush  3 mL Intravenous Q12H  . vitamin C  500 mg Oral Daily  . zinc sulfate  220 mg Oral Daily    Assessment: 36 yof presenting with SOB and weight gain - on apixaban PTA, has been on hold for mitral/tricupsid valve replacement scheduled for 11/18 (last dose on 11/8). Also found to be COVID +. D-dimer elevated but trending down. LE dopplers and V/Q scan neg for PE/DVT.   APTT therapeutic at 92 on drip rate 650 units/hr. Heparin level is therapeutic at 0.34, and is now correlating with aPTT. Apixaban  if last dose was noted to be 11/8. CBC stable, no overt bleeding or infusion issues per RN. Will stop monitoring aPTTs and monitor heparin from HL alone.  Goal of Therapy:  Heparin level 0.3-0.7 units/ml  APTT 66-102 seconds Monitor platelets by anticoagulation protocol: Yes   Plan:  Continue heparin to 650 units/hr Daily heparin level, CBC Monitor for signs of bleeding  Nicoletta Dress, PharmD PGY2 Infectious Disease Pharmacy Resident  Phone: [phone] 10/17/2019  7:30 AM  Please check [email or web address] for unit-specific pharmacy phone numbers.

## 2019-10-17 NOTE — Progress Notes (Signed)
CRITICAL VALUE ALERT  Critical Value:  Sodium 119  Date & Time Notied:  10/17/2019; 2300  Provider Notified: X. Blount, NP paged thru Amion  Orders Received/Actions taken: awaiting new orders.  Milford Cage, RN

## 2019-10-18 ENCOUNTER — Inpatient Hospital Stay (HOSPITAL_COMMUNITY): Payer: Medicare Other

## 2019-10-18 DIAGNOSIS — R570 Cardiogenic shock: Secondary | ICD-10-CM

## 2019-10-18 DIAGNOSIS — G9340 Encephalopathy, unspecified: Secondary | ICD-10-CM | POA: Diagnosis not present

## 2019-10-18 DIAGNOSIS — N179 Acute kidney failure, unspecified: Secondary | ICD-10-CM | POA: Diagnosis not present

## 2019-10-18 DIAGNOSIS — I509 Heart failure, unspecified: Secondary | ICD-10-CM | POA: Diagnosis not present

## 2019-10-18 DIAGNOSIS — I482 Chronic atrial fibrillation, unspecified: Secondary | ICD-10-CM | POA: Diagnosis not present

## 2019-10-18 DIAGNOSIS — I5041 Acute combined systolic (congestive) and diastolic (congestive) heart failure: Secondary | ICD-10-CM | POA: Diagnosis not present

## 2019-10-18 LAB — CBC WITH DIFFERENTIAL/PLATELET
Abs Immature Granulocytes: 0.04 10*3/uL (ref 0.00–0.07)
Basophils Absolute: 0 10*3/uL (ref 0.0–0.1)
Basophils Relative: 0 %
Eosinophils Absolute: 0 10*3/uL (ref 0.0–0.5)
Eosinophils Relative: 0 %
HCT: 26.8 % — ABNORMAL LOW (ref 36.0–46.0)
Hemoglobin: 8.4 g/dL — ABNORMAL LOW (ref 12.0–15.0)
Immature Granulocytes: 1 %
Lymphocytes Relative: 14 %
Lymphs Abs: 1 10*3/uL (ref 0.7–4.0)
MCH: 23.5 pg — ABNORMAL LOW (ref 26.0–34.0)
MCHC: 31.3 g/dL (ref 30.0–36.0)
MCV: 74.9 fL — ABNORMAL LOW (ref 80.0–100.0)
Monocytes Absolute: 1 10*3/uL (ref 0.1–1.0)
Monocytes Relative: 14 %
Neutro Abs: 5 10*3/uL (ref 1.7–7.7)
Neutrophils Relative %: 71 %
Platelets: 227 10*3/uL (ref 150–400)
RBC: 3.58 MIL/uL — ABNORMAL LOW (ref 3.87–5.11)
RDW: 17.9 % — ABNORMAL HIGH (ref 11.5–15.5)
WBC: 7 10*3/uL (ref 4.0–10.5)
nRBC: 0.7 % — ABNORMAL HIGH (ref 0.0–0.2)

## 2019-10-18 LAB — RENAL FUNCTION PANEL
Albumin: 2.9 g/dL — ABNORMAL LOW (ref 3.5–5.0)
Anion gap: 17 — ABNORMAL HIGH (ref 5–15)
BUN: 59 mg/dL — ABNORMAL HIGH (ref 8–23)
CO2: 25 mmol/L (ref 22–32)
Calcium: 9 mg/dL (ref 8.9–10.3)
Chloride: 82 mmol/L — ABNORMAL LOW (ref 98–111)
Creatinine, Ser: 3.57 mg/dL — ABNORMAL HIGH (ref 0.44–1.00)
GFR calc Af Amer: 14 mL/min — ABNORMAL LOW (ref >60)
GFR calc non Af Amer: 12 mL/min — ABNORMAL LOW (ref >60)
Glucose, Bld: 103 mg/dL — ABNORMAL HIGH (ref 70–99)
Phosphorus: 5.2 mg/dL — ABNORMAL HIGH (ref 2.5–4.6)
Potassium: 4.2 mmol/L (ref 3.5–5.1)
Sodium: 124 mmol/L — ABNORMAL LOW (ref 135–145)

## 2019-10-18 LAB — BASIC METABOLIC PANEL
Anion gap: 13 (ref 5–15)
BUN: 58 mg/dL — ABNORMAL HIGH (ref 8–23)
CO2: 29 mmol/L (ref 22–32)
Calcium: 8.9 mg/dL (ref 8.9–10.3)
Chloride: 82 mmol/L — ABNORMAL LOW (ref 98–111)
Creatinine, Ser: 3.47 mg/dL — ABNORMAL HIGH (ref 0.44–1.00)
GFR calc Af Amer: 14 mL/min — ABNORMAL LOW (ref >60)
GFR calc non Af Amer: 12 mL/min — ABNORMAL LOW (ref >60)
Glucose, Bld: 101 mg/dL — ABNORMAL HIGH (ref 70–99)
Potassium: 4 mmol/L (ref 3.5–5.1)
Sodium: 124 mmol/L — ABNORMAL LOW (ref 135–145)

## 2019-10-18 LAB — COOXEMETRY PANEL
Carboxyhemoglobin: 1.6 % — ABNORMAL HIGH (ref 0.5–1.5)
Methemoglobin: 1.2 % (ref 0.0–1.5)
O2 Saturation: 86.9 %
Total hemoglobin: 8.7 g/dL — ABNORMAL LOW (ref 12.0–16.0)

## 2019-10-18 LAB — GLUCOSE, CAPILLARY
Glucose-Capillary: 120 mg/dL — ABNORMAL HIGH (ref 70–99)
Glucose-Capillary: 117 mg/dL — ABNORMAL HIGH (ref 70–99)
Glucose-Capillary: 111 mg/dL — ABNORMAL HIGH (ref 70–99)
Glucose-Capillary: 314 mg/dL — ABNORMAL HIGH (ref 70–99)
Glucose-Capillary: 142 mg/dL — ABNORMAL HIGH (ref 70–99)

## 2019-10-18 LAB — D-DIMER, QUANTITATIVE: D-Dimer, Quant: 0.83 ug/mL-FEU — ABNORMAL HIGH (ref 0.00–0.50)

## 2019-10-18 LAB — HEPARIN LEVEL (UNFRACTIONATED)
Heparin Unfractionated: 0.2 IU/mL — ABNORMAL LOW (ref 0.30–0.70)
Heparin Unfractionated: 0.25 IU/mL — ABNORMAL LOW (ref 0.30–0.70)

## 2019-10-18 LAB — C-REACTIVE PROTEIN: CRP: 2.8 mg/dL — ABNORMAL HIGH (ref <1.0)

## 2019-10-18 LAB — LACTATE DEHYDROGENASE: LDH: 227 U/L — ABNORMAL HIGH (ref 98–192)

## 2019-10-18 LAB — FERRITIN: Ferritin: 274 ng/mL (ref 11–307)

## 2019-10-18 LAB — MAGNESIUM: Magnesium: 2.5 mg/dL — ABNORMAL HIGH (ref 1.7–2.4)

## 2019-10-18 MED ORDER — PRISMASOL BGK 4/2.5 32-4-2.5 MEQ/L REPLACEMENT SOLN
Status: DC
  Administered 2019-10-18 – 2019-10-24 (×9): via INTRAVENOUS_CENTRAL
  Filled 2019-10-18 (×11): qty 5000

## 2019-10-18 MED ORDER — PRISMASOL BGK 4/2.5 32-4-2.5 MEQ/L REPLACEMENT SOLN
Status: DC
  Administered 2019-10-18 – 2019-10-24 (×12): via INTRAVENOUS_CENTRAL
  Filled 2019-10-18 (×14): qty 5000

## 2019-10-18 MED ORDER — PRISMASOL BGK 4/2.5 32-4-2.5 MEQ/L IV SOLN
INTRAVENOUS | Status: DC
  Administered 2019-10-18 – 2019-10-24 (×40): via INTRAVENOUS_CENTRAL
  Filled 2019-10-18 (×54): qty 5000

## 2019-10-18 MED ORDER — HEPARIN SODIUM (PORCINE) 1000 UNIT/ML DIALYSIS
1000.0000 [IU] | INTRAMUSCULAR | Status: DC | PRN
  Administered 2019-10-18: 1000 [IU] via INTRAVENOUS_CENTRAL
  Filled 2019-10-18: qty 6
  Filled 2019-10-18: qty 3
  Filled 2019-10-18 (×2): qty 6
  Filled 2019-10-18: qty 3

## 2019-10-18 NOTE — Progress Notes (Signed)
Patient ID: Melissa Adams, female   DOB: 08-12-1946, 73 y.o.   MRN: [id number]     Advanced Heart Failure Rounding Note  PCP-Cardiologist: No primary care provider on file.   Subjective:    UOP not vigorous again yesterday, BUN/creatinine higher at 58/3.47.  Remains in atrial fibrillation but rate lower 90s-110s.  SBP generally 100s.  She remains on amiodarone gtt 60 mg/hr, milrinone 0.25 mcg/kg/min, and Lasix 20 mg/hr.  Co-ox 86% today.  CVP a bit lower at 15-16.   She got tolvaptan on 11/22.  Na is 124 today.   Not short of breath at rest, +orthopnea.   Objective:   Weight Range: 117.6 kg Body mass index is 45.93 kg/m.   Vital Signs:   Temp:  [98.1 F (36.7 C)-98.3 F (36.8 C)] 98.1 F (36.7 C) (11/23 0031) Pulse Rate:  [94-126] 94 (11/23 0600) Resp:  [15-25] 15 (11/23 0600) BP: (79-128)/(47-109) 102/50 (11/23 0600) SpO2:  [85 %-99 %] 94 % (11/23 0600) Weight:  [117.6 kg] 117.6 kg (11/23 0500) Last BM Date: (PTa)  Weight change: Filed Weights   10/16/19 0430 10/17/19 0500 10/18/19 0500  Weight: 117.1 kg 117.6 kg 117.6 kg    Intake/Output:   Intake/Output Summary (Last 24 hours) at 10/18/2019 1328 Last data filed at 10/18/2019 1200 Gross per 24 hour  Intake 1741.36 ml  Output 1050 ml  Net 691.36 ml      Physical Exam    General: NAD Neck: JVP 14-16 cm, no thyromegaly or thyroid nodule.  Lungs: Clear to auscultation bilaterally with normal respiratory effort. CV: Lateral PMI.  Heart irregular S1/S2, no S3/S4, 2/6 HSM LLSB.  1+ edema to knees.   Abdomen: Soft, nontender, no hepatosplenomegaly, no distention.  Skin: Intact without lesions or rashes.  Neurologic: Alert and oriented x 3.  Psych: Normal affect. Extremities: No clubbing or cyanosis.  HEENT: Normal.    Telemetry   Atrial fibrillation rate 100s (personally reviewed).   Labs    CBC Recent Labs    10/17/19 0519 10/18/19 0439  WBC 6.6 7.0  NEUTROABS  --  5.0  HGB 7.5* 8.4*  HCT  24.6* 26.8*  MCV 78.3* 74.9*  PLT 200 510   Basic Metabolic Panel Recent Labs    10/17/19 0519  10/17/19 1658 10/18/19 0439  NA 119*   < > 125* 124*  K 3.7   < > 4.0 4.0  CL 76*   < > 84* 82*  CO2 27   < > 27 29  GLUCOSE 455*   < > 120* 101*  BUN 48*   < > 56* 58*  CREATININE 2.63*   < > 3.07* 3.47*  CALCIUM 8.4*   < > 9.1 8.9  MG 2.3  --   --  2.5*   < > = values in this interval not displayed.   Liver Function Tests Recent Labs    10/16/19 0341  AST 64*  ALT 65*  ALKPHOS 164*  BILITOT 1.4*  PROT 6.2*  ALBUMIN 3.2*   No results for input(s): LIPASE, AMYLASE in the last 72 hours. Cardiac Enzymes No results for input(s): CKTOTAL, CKMB, CKMBINDEX, TROPONINI in the last 72 hours.  BNP: BNP (last 3 results) Recent Labs    10/11/19 1309  BNP 3,374.7*    ProBNP (last 3 results) No results for input(s): PROBNP in the last 8760 hours.   D-Dimer Recent Labs    10/17/19 0519  DDIMER 1.16*   Hemoglobin A1C Recent Labs  10/17/19 0817  HGBA1C 6.6*   Fasting Lipid Panel No results for input(s): CHOL, HDL, LDLCALC, TRIG, CHOLHDL, LDLDIRECT in the last 72 hours. Thyroid Function Tests No results for input(s): TSH, T4TOTAL, T3FREE, THYROIDAB in the last 72 hours.  Invalid input(s): FREET3  Other results:   Imaging    Dg Chest Port 1 View  Result Date: 10/18/2019 CLINICAL DATA:  Status post central line placement today. EXAM: PORTABLE CHEST 1 VIEW COMPARISON:  Single-view of the chest 10/14/2019. FINDINGS: New right IJ catheter is in place with the tip projecting in the mid superior vena cava. Left PICC is unchanged. No pneumothorax. There are right greater than left pleural effusions and airspace disease which are new since the prior examination. Marked cardiomegaly is again seen. Atherosclerosis is noted. IMPRESSION: Right IJ catheter tip projects in the mid superior vena cava. Negative for pneumothorax. Right greater than left pleural effusions with  associated airspace disease which could be due to atelectasis pneumonia. Cardiomegaly. Atherosclerosis. Electronically Signed   By: Inge Rise M.D.   On: 10/18/2019 12:13     Medications:     Scheduled Medications:  allopurinol  300 mg Oral Daily   atorvastatin  40 mg Oral Daily   Chlorhexidine Gluconate Cloth  6 each Topical Daily   insulin aspart  0-5 Units Subcutaneous QHS   insulin aspart  0-9 Units Subcutaneous TID WC   mouth rinse  15 mL Mouth Rinse BID   multivitamin with minerals  1 tablet Oral Daily   vitamin C  500 mg Oral Daily   zinc sulfate  220 mg Oral Daily    Infusions:   prismasol BGK 4/2.5 400 mL/hr at 10/18/19 1302    prismasol BGK 4/2.5 300 mL/hr at 10/18/19 1302   amiodarone 60 mg/hr (10/18/19 1157)   furosemide (LASIX) infusion 20 mg/hr (10/18/19 0947)   heparin Stopped (10/18/19 0946)   milrinone 0.25 mcg/kg/min (10/18/19 1041)   prismasol BGK 4/2.5 1,800 mL/hr at 10/18/19 1302    PRN Medications: acetaminophen, cyclobenzaprine, heparin, ondansetron (ZOFRAN) IV, sodium chloride flush, traMADol   Assessment/Plan   1. Acute on chronic systolic CHF:  Nonischemic cardiomyopathy, cath in [place] in 9/20 with no significant coronary disease.  Echo with EF 20-25% with dilated and severely dysfunctional RV with D-shaped interventricular septum.  MR appears moderate and TR appears moderate.  With dilated RV, V/Q scan was done and was negative.  Biventricular failure.  Last echo in [place] per report showed EF 40-45%.  Cause of fall in EF uncertain, could be due to atrial fibrillation with RVR (tachycardia-mediated).  Apparently chronic AF but last clinic note from outpatient cardiologist outlines plan for possible atrial fibrillation ablation.  Myocarditis, potentially from COVID-19, is possible.  It is also possible that she has a stress cardiomyopathy due to medical illness/COVID-19 infection.  I have been concerned for low output HF with rise in  creatinine and difficulty with diuresis.  Started milrinone at 0.25, HR generally in 100s currently.  Co-ox has been good on milrinone.  Creatinine rising, up to 3.47.  Still hyponatremic despite tolvaptan dose.  Diuresis not vigorous, she remains volume overloaded on exam and CVP 16.  - Continue milrinone 0.25 mcg/kg/min.  - Discussed with nephrology, given poor diuresis again yesterday with rising creatinine and volume overload, we will plan to start CVVH (line has been placed).  Will aim for net UF 50-100 cc/hr for now and can increase as tolerated.  - I will stop Lasix gtt when CVVH starts.  2. Valvular heart disease: Patient was supposed to have mitral and tricuspid valve replacements this week.  TEE in 9/20 in [place] showed mod-severe MR and severe TR.  On my read of the echo done here, she has moderate MR and moderate TR (both central and likely functional).  Her main issue here seems to be biventricular failure and would hold off on valve procedures for the time being.  3. Atrial fibrillation: Chronic per outpatient notes. Per daughter, she has had cardioversions in the past. There apparently has been consideration for atrial fibrillation ablation. She remains in RVR but rate better-controlled today (100s this morning).  - Continue amiodarone 60 mg/hr.  - Continue heparin gtt for now.  - Eventually I think she will need TEE-DCCV.  Would try to get her off milrinone first and will need to be more diuresed to have best chance to keep her in NSR.  Procedure will be complicated by [id number]   4. COVID-19 infection: No PNA on CXR.  On supplemental O2 but this may be due to CHF. Seems to be doing ok from this standpoint. Finished remdesivir.  5. AKI: Per report, baseline creatinine around 1.2, now up to 3.47.  Suspect primarily cardiorenal. She is volume overloaded and not diuresing adequately.  - Trying to support cardiac output with milrinone.  - Continue to try to control HR.  - As above, plan  CVVH today.  Hopefully renal function will recover once volume is removed.  6. Hyponatremia: Hypervolemic hypernatremia.  She got tolvaptan on 11/22.  Na still 124.  CVVH today, should allow [place] to gradually normalize her sodium.   CRITICAL CARE Performed by: [name]  Total critical care time: 35 minutes  Critical care time was exclusive of separately billable procedures and treating other patients.  Critical care was necessary to treat or prevent imminent or life-threatening deterioration.  Critical care was time spent personally by me on the following activities: development of treatment plan with patient and/or surrogate as well as nursing, discussions with consultants, evaluation of patient's response to treatment, examination of patient, obtaining history from patient or surrogate, ordering and performing treatments and interventions, ordering and review of laboratory studies, ordering and review of radiographic studies, pulse oximetry and re-evaluation of patient's condition.   Length of Stay: 6  [name], MD  10/18/2019, 1:28 PM  Advanced Heart Failure Team Pager [phone] (M-F; 7a - 4p)  Please contact [facility] for night-coverage after hours (4p -7a ) and weekends on [email or web address]

## 2019-10-18 NOTE — Progress Notes (Addendum)
[facility] Progress Note    Assessment/ Plan:   1.  AKI on CKD 3: baseline Cr reportedly 1.2.  Likely d/t decompensated CHF.  UA with 6-10 RBCs and no WBCs, UP/C 0.2, not really reflective of GN or COVID AKI. Renal US neg.   Cr uptrending in setting of worsening hemodynamics, Afib, and low BP--> cardiogenic shock picture.  Didn't really diurese well despite milrinone and Lasix gtt.   - s/p tolvaptan on 11/22  - Anticipate need for CRRT - she has asked that I contact her daughter about this - spoke with her daughter and heart failure on 11/23.  We have discussed risks, benefits, and indications for CRRT and plan to initiate CRRT   2.  Severe biventricular CHF: was due for MV and TV replacement in [place] on 11/18.  TTE performed--> Biventricular failure with EF 20-25%.  Advanced HF following, appreciate.  milrinone, Lasix gtt  3.  COVID + : on supplemental O2 on hep gtt, s/p remdesivir  4.  Afib; was on metop and eliquis as OP, now on amio gtt and hep gtt.  5.  Hyponatremia: improving; s/p tolvaptan   6. Anemia - iron deficiency and AKI - no acute indication for PRBC's  7.  Dispo: in ICU   Subjective:    Has a purewick and this leaks.  Had 700 ml uop overnight shift per nursing with 1.3 liters over 11/22. Has been on milrinone and lasix gtt.   Review of systems: Denies shortness of breath  Denies chest pain  Denies n/v       Objective:   BP (!) 102/50   Pulse 94   Temp 98.1 F (36.7 C) (Oral)   Resp 15   Ht 5\' 3"  (1.6 m)   Wt 117.6 kg   SpO2 94%   BMI 45.93 kg/m   Intake/Output Summary (Last 24 hours) at 10/18/2019 9379 Last data filed at 10/18/2019 0500 Gross per 24 hour  Intake 1743.3 ml  Output 1325 ml  Net 418.3 ml   Weight change: 0 kg  Physical Exam:  Examined with appropriate PPE General adult female in bed in no acute distress HEENT normocephalic atraumatic extraocular movements intact sclera anicteric Neck supple trachea midline Lungs  clear but reduced on exam; on 2 liters oxygen Heart tachy, s1s2 Abdomen soft nontender nondistended Extremities 2+ edema  Psych normal mood and affect  Imaging: No results found.  Labs: BMET Recent Labs  Lab 10/14/19 1431  10/15/19 1705 10/16/19 0341 10/17/19 0519 10/17/19 0817 10/17/19 1005 10/17/19 1658 10/18/19 0439  NA 135   < > 128* 128* 119* 120* 127* 125* 124*  K 3.7   < > 3.9 3.7 3.7 3.7 4.1 4.0 4.0  CL 92*   < > 90* 87* 76* 78* 84* 84* 82*  CO2 28   < > 24 27 27 27 28 27 29   GLUCOSE 122*   < > 125* 110* 455* 377* 110* 120* 101*  BUN 52*   < > 52* 50* 48* 50* 53* 56* 58*  CREATININE 2.35*   < > 2.53* 2.66* 2.63* 2.70* 2.91* 3.07* 3.47*  CALCIUM 9.2   < > 8.9 9.1 8.4* 8.4* 9.2 9.1 8.9  PHOS 3.7  --   --   --   --   --   --   --   --    < > = values in this interval not displayed.   CBC Recent Labs  Lab 10/12/19 0431  10/15/19  2841 10/15/19 1703 10/16/19 0341 10/17/19 0519 10/18/19 0439  WBC 5.3   < > 8.3  --  6.8 6.6 7.0  NEUTROABS 3.2  --   --   --   --   --  5.0  HGB 9.2*   < > 7.8* 8.2* 8.1* 7.5* 8.4*  HCT 30.6*   < > 25.9* 26.4* 26.6* 24.6* 26.8*  MCV 78.7*   < > 79.0*  --  77.8* 78.3* 74.9*  PLT 156   < > 192  --  200 200 227   < > = values in this interval not displayed.    Medications:    . allopurinol  300 mg Oral Daily  . atorvastatin  40 mg Oral Daily  . Chlorhexidine Gluconate Cloth  6 each Topical Daily  . insulin aspart  0-5 Units Subcutaneous QHS  . insulin aspart  0-9 Units Subcutaneous TID WC  . mouth rinse  15 mL Mouth Rinse BID  . multivitamin with minerals  1 tablet Oral Daily  . sodium chloride flush  10-40 mL Intracatheter Q12H  . sodium chloride flush  3 mL Intravenous Q12H  . vitamin C  500 mg Oral Daily  . zinc sulfate  220 mg Oral Daily    Claudia Desanctis  10/18/2019, 6:42 AM

## 2019-10-18 NOTE — Progress Notes (Addendum)
NAME:  Melissa Adams, MRN:  [id number], DOB:  1946-09-03, LOS: 6 ADMISSION DATE:  10/11/2019, CONSULTATION DATE:  11/23 REFERRING MD: Royce Macadamia  , CHIEF COMPLAINT:  Worsening renal failure in setting of biventricular heart failure    Brief History   73 year old female admitted w/ acute biventricular heart failure. EF decreased from 40-45% down to 20-25% (unclear if this is rate related or due to myocarditis from [place]) Incidentally COVID-19 positive.   PCCM asked to see 11/23 for progressive cardio-renal syndrome and need for HD on 11/23  History of present illness   73 year old female w/ hx as mentioned below. Admitted 11/16 w/ cc: weight gain (22 lbs) and worsening shortness of breath. BNP elevated. Admitted w/ working dx of decompensated HF and hyponatremia.  ->incidentally fund to be COVID-19 positive.  Started on Remdesivir (steroids stopped as felt mostly HF) and lasix 11/18; Started on milrinone w/ lasix and PICC placed for CVP. 11/20 to ICU for close obs 11/21 got tolvaptan per cards Na 128 felt d/t volume overload 11/22 lasix gtt. Repeated Tolvaptan  11/23 renal fxn worse, still volume overloaded. 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 11/20 to ICU for close obs 11/21 got tolvaptan per cards Na 128 felt d/t volume overload 11/22 lasix gtt. Repeated Tolvaptan  11/23 renal fxn worse, still volume overloaded. PCCM asked to see for HD cath and assist w/ RX Consults:  11/16 cards 11/17 renal 11/18 heart failure  11/23 critical care   Procedures:    Significant Diagnostic Tests:  11/17 echo:  Left ventricular ejection fraction, by visual estimation, is 20 to 25%. The left  ventricle has severely decreased function. Mildly dilated left ventricular internal cavity size.Global right ventricle has severely reduced systolic function.The right ventricular size is mildly enlarged. Left atrial size was moderately dilated. Right atrial size was severely dilated. The mitral valve is abnormal. Mild to moderate mitral valve regurgitation. No evidence of mitral stenosis.. The tricuspid valve is normal in structure. Tricuspid valve regurgitation moderate. The aortic valve is tricuspid. Aortic valve regurgitation is not visualized. Mild aortic valve sclerosis without stenosis.  The pulmonic valve was normal in structure. Pulmonic valve regurgitation is mild.Moderately elevated pulmonary artery systolic pressure.The inferior vena cava is dilated in size with <50% respiratory variability, suggesting right atrial pressure of 15 mmHg. Severe global reduction in LV systolic function; 4 chamber enlargement; mild to moderate MR; severely reduced RV function; moderate TR.  Micro Data:  11/16 and 11/17 COVID: positive   Antimicrobials:  Remdesivir 11/17-->11/22  Interim history/subjective:  pccm asked to see for HD cath and progressive renal failure   Objective   Blood pressure (Abnormal) 102/50, pulse 94, temperature 98.1 F (36.7 C), temperature source Oral, resp. rate 15, height 5\' 3"  (1.6 m), weight 117.6 kg, SpO2 94 %. CVP:  [10 mmHg-70 mmHg] 18 mmHg      Intake/Output Summary (Last 24 hours) at 10/18/2019 0951 Last data filed at 10/18/2019 0947 Gross per 24 hour  Intake 1876.82 ml  Output 1300 ml  Net 576.82 ml   Filed Weights   10/16/19 0430 10/17/19 0500 10/18/19 0500  Weight: 117.1  kg 117.6 kg 117.6 kg    Examination: General: pleasant 73 year old aaf. Resting in bed. Not in acute distress BUT still has positional shortness of breath  HENT: NCAT + JVD. MMM Lungs: crackles bilateral bases. No accessory use on 3 liters Cardiovascular: irreg irreg w/ HR 100s Abdomen:  soft not tender + bowel sounds no OM Extremities: skin is tight and edematous. LE wrapped Neuro: awake and alert no focal def  GU: voids cl yellow   Resolved Hospital Problem list     Assessment & Plan:   Acute on chronic biventricular heart failure w/ EF down to 20-25% (baseline was 40-45%); c/b known mod TR and  MR.  -? Rate related CM or myocarditis from COVID -has not responded to inotropes and diuretics.  Plan Will place HD cath for ultrafiltration Cont inotrope support as directed by HF team Cont tele   AF w/ RVR Plan Cont amiodarone per cards IV heparin HF considering cardioversion  Dyspnea 2/2 HF Plan Supplemental oxygen  Pulse ox  cxr post HD cath   AKI ->cardiorenal syndrome in setting of decompensated HF -renal fxn cont to decline Plan Renal dose meds Maximize CO Strict I&O CRRT per renal  Hypo-osmolar Hyponatremia in setting of heart failure  -no response to tolvaptan Plan Initiate ultrafiltration per CRRT Serial chemistries.  Ideally try to keep Na rise no higher than 6 in next 6 hours  Anemia of chronic disease  Plan Trend cbc Transfuse for hgb < 7  COVID-19 infection She is s/p 5 days remdesivir  Plan Cont respiratory isolation Check inflammatory markers   Best practice:  Diet:HF diet  Pain/Anxiety/Delirium protocol (if indicated): NA VAP protocol (if indicated): na DVT prophylaxis: iv HEPARIN  GI prophylaxis: NA Glucose control: ssi Mobility: w/ assist Code Status: full code  Family Communication: pending  Disposition: on-going ICU care   Labs   CBC: Recent Labs  Lab 10/12/19 0431  10/14/19 0059 10/15/19 0548 10/15/19 1703 10/16/19 0341 10/17/19 0519 10/18/19 0439  WBC 5.3   < > 7.6 8.3  --  6.8 6.6 7.0  NEUTROABS 3.2  --   --   --   --   --   --  5.0  HGB 9.2*   < > 8.8* 7.8* 8.2* 8.1* 7.5* 8.4*  HCT 30.6*   < > 28.2* 25.9* 26.4* 26.6* 24.6* 26.8*  MCV 78.7*   < > 76.8* 79.0*  --  77.8* 78.3* 74.9*  PLT 156   < > 235  192  --  200 200 227   < > = values in this interval not displayed.    Basic Metabolic Panel: Recent Labs  Lab 10/14/19 0059 10/14/19 1431 10/15/19 0548  10/16/19 0341 10/17/19 0519 10/17/19 0817 10/17/19 1005 10/17/19 1658 10/18/19 0439  NA 132* 135 127*   < > 128* 119* 120* 127* 125* 124*  K 4.0 3.7 3.6   < > 3.7 3.7 3.7 4.1 4.0 4.0  CL 93* 92* 86*   < > 87* 76* 78* 84* 84* 82*  CO2 26 28 26    < > 27 27 27 28 27 29   GLUCOSE 130* 122* 204*   < > 110* 455* 377* 110* 120* 101*  BUN 51* 52* 51*   < > 50* 48* 50* 53* 56* 58*  CREATININE 2.09* 2.35* 2.41*   < > 2.66* 2.63* 2.70* 2.91* 3.07* 3.47*  CALCIUM 9.2 9.2 8.7*   < > 9.1 8.4* 8.4* 9.2 9.1 8.9  MG 2.4  --  2.3  --  2.5* 2.3  --   --   --  2.5*  PHOS  --  3.7  --   --   --   --   --   --   --   --    < > = values in this interval not displayed.   GFR: Estimated Creatinine Clearance: 18.2 mL/min (A) (by C-G formula based on SCr of 3.47 mg/dL (H)). Recent Labs  Lab 10/15/19 0548 10/16/19 0341 10/17/19 0519 10/18/19 0439  WBC 8.3 6.8 6.6 7.0    Liver Function Tests: Recent Labs  Lab 10/12/19 0433 10/13/19 0335 10/14/19 0059 10/14/19 1431 10/15/19 0548 10/16/19 0341  AST 68* 78* 79*  --  70* 64*  ALT 51* 61* 65*  --  63* 65*  ALKPHOS 181* 192* 178*  --  153* 164*  BILITOT 1.7* 2.2* 2.1*  --  1.9* 1.4*  PROT 6.5 6.8 6.8  --  6.2* 6.2*  ALBUMIN 3.6 3.5 3.7 3.4* 3.3* 3.2*   No results for input(s): LIPASE, AMYLASE in the last 168 hours. No results for input(s): AMMONIA in the last 168 hours.  ABG    Component Value Date/Time   O2SAT 86.9 10/18/2019 0439     Coagulation Profile: Recent Labs  Lab 10/12/19 0431  INR 1.8*    Cardiac Enzymes: No results for input(s): CKTOTAL, CKMB, CKMBINDEX, TROPONINI in the last 168 hours.  HbA1C: Hgb A1c MFr Bld  Date/Time Value Ref Range Status  10/17/2019 08:17 AM 6.6 (H) 4.8 - 5.6 % Final    Comment:    (NOTE) Pre diabetes:          5.7%-6.4% Diabetes:               >6.4% Glycemic control for   <7.0% adults with diabetes     CBG: Recent Labs  Lab 10/17/19 0830 10/17/19 0917 10/17/19 1015 10/17/19 2140  GLUCAP 388* 128* 111* 119*    Review of Systems:    Review of Systems  Constitutional: Positive for malaise/fatigue. Negative for chills and fever.  HENT: Negative for congestion and sore throat.   Eyes: Negative.   Respiratory: Positive for shortness of breath and wheezing.   Cardiovascular: Positive for orthopnea and leg swelling. Negative for chest pain and palpitations.  Gastrointestinal: Negative.   Genitourinary: Negative.   Musculoskeletal: Negative.   Skin: Negative.   Neurological: Negative.   Endo/Heme/Allergies: Negative.   Psychiatric/Behavioral: Negative.     Past Medical History  She,  has a past medical history of A-fib ([place]), Cardiomyopathy ([place]), CHF (congestive heart failure) ([place]), Edema, Hypertension, Pulmonary hypertension ([place]), Sleep apnea, and Tricuspid regurgitation.   Surgical History   History reviewed. No pertinent surgical history.   Social History   reports that she has never smoked. She has never used smokeless tobacco. She reports previous alcohol use.   Family History   Her family history includes CAD in her father; Diabetes in her sister; Hypertension in her sister; Lupus in her sister; Seizures in her father.   Allergies No Known Allergies   Home Medications  Prior to Admission medications   Medication Sig Start Date End Date Taking? Authorizing Provider  allopurinol (ZYLOPRIM) 300 MG tablet Take 300 mg by mouth daily.   Yes [provider]  apixaban (ELIQUIS) 5 MG TABS tablet Take 5 mg by mouth 2 (two) times daily.   Yes [provider]  atorvastatin (LIPITOR) 40 MG tablet Take 40 mg by mouth daily.  Yes [provider]  colchicine 0.6 MG tablet Take 0.6 mg by mouth as needed (for Gout).    Yes [provider]  furosemide (LASIX) 80 MG tablet Take  80 mg by mouth daily.    Yes [provider]  metoprolol succinate (TOPROL-XL) 100 MG 24 hr tablet Take 50 mg by mouth daily. Take with or immediately following a meal.    Yes [provider]  montelukast (SINGULAIR) 10 MG tablet Take 10 mg by mouth daily.   Yes [provider]     Critical care time:  39 minutes.    Erick Colace ACNP-BC [facility] Pulmonary/Critical Care Pager # [phone] OR # [phone] if no answer Attending Note:  73 year old female with extensive PMH who presents to PCCM with cardiogenic shock and renal failure in the context of COVID-19 positive disease.  Patient has been on milrinone and lasix drips but renal function failed to improve and PCCM was consulted for hypotension and CRRT line placement.  Overnight, no events.  No new complaints.  On exam, she is a morbidly obese female, laying in bed in NAD.  I reviewed CXR myself, pulmonary edema noted.  Discussed with PCCM-NP.  Given pressors need and CRRT needs, patient is ICU status.  PCCM will assume care.  Continue milrinone drip per advanced heart failure team recommendations.  Will defer lasix drip to renal.  Place trialysis catheter for CRRT and hopefully volume negative.  Check CXR today post line placement but will need to hold heparin for line placement.  PCCM will assume care.  The patient is critically ill with multiple organ systems failure and requires high complexity decision making for assessment and support, frequent evaluation and titration of therapies, application of advanced monitoring technologies and extensive interpretation of multiple databases.   Critical Care Time devoted to patient care services described in this note is  32  Minutes. This time reflects time of care of this signee Dr Jennet Maduro. This critical care time does not reflect procedure time, or teaching time or supervisory time of PA/NP/Med student/Med Resident etc but could involve care discussion time.  Rush Farmer, M.D. [facility] Pulmonary/Critical Care Medicine.

## 2019-10-18 NOTE — Progress Notes (Signed)
PT Cancellation Note  Patient Details Name: Melissa Adams MRN: [id number] DOB: 11/17/1946   Cancelled Treatment:    Reason Eval/Treat Not Completed: (P) Medical issues which prohibited therapy;Patient at procedure or test/unavailable Pt has had a decline in status and is currently receiving CRRT. PT will follow back tomorrow to determine appropriateness.  Oniel Meleski B. Migdalia Dk PT, DPT Acute Rehabilitation Services Pager [phone] Office [phone]    [place] 10/18/2019, 1:45 PM

## 2019-10-18 NOTE — Progress Notes (Signed)
PCCM taking over today due to need for CRRT. Will sign off. Please let hospitalist know if we can be of assistance going forward.

## 2019-10-18 NOTE — Procedures (Signed)
Hemodialysis Catheter Insertion Procedure Note Meta Kroenke [id number] Apr 15, 1946  Procedure: Insertion of Hemodialysis Catheter Indications: Dialysis Access   Procedure Details Consent: Risks of procedure as well as the alternatives and risks of each were explained to the (patient/caregiver).  Consent for procedure obtained. Time Out: Verified patient identification, verified procedure, site/side was marked, verified correct patient position, special equipment/implants available, medications/allergies/relevent history reviewed, required imaging and test results available.  Performed Real time [place] used to ID and cannulate vessel  Maximum sterile technique was used including antiseptics, cap, gloves, gown, hand hygiene, mask and sheet. Skin prep: Chlorhexidine; local anesthetic administered Triple lumen hemodialysis catheter was inserted into right internal jugular vein using the Seldinger technique.  Evaluation Blood flow good Complications: No apparent complications Patient did tolerate procedure well. Chest X-ray ordered to verify placement.  CXR: pending.   Clementeen Graham 10/18/2019 Erick Colace ACNP-BC [place] Pager # [phone] OR # [phone] if no answer

## 2019-10-18 NOTE — Progress Notes (Addendum)
Inpatient Diabetes Program Recommendations  AACE/ADA: New Consensus Statement on Inpatient Glycemic Control (2015)  Target Ranges:  Prepandial:   less than 140 mg/dL      Peak postprandial:   less than 180 mg/dL (1-2 hours)      Critically ill patients:  140 - 180 mg/dL   Results for MALAVIKA, LIRA (MRN [id number]) as of 10/18/2019 07:22  Ref. Range 10/17/2019 08:30 10/17/2019 09:17 10/17/2019 10:15 10/17/2019 21:40  Glucose-Capillary Latest Ref Range: 70 - 99 mg/dL 388 (H)  Question accuracy of result? 128 (H)   111 (H)  NO Novolog given 119 (H)  NO Novolog given   Results for ZINA, PITZER (MRN [id number]) as of 10/18/2019 07:22  Ref. Range 10/18/2019 04:39  Glucose Latest Ref Range: 70 - 99 mg/dL 101 (H)   Results for AIJA, SCARFO (MRN [id number]) as of 10/18/2019 07:22  Ref. Range 10/17/2019 08:17  Hemoglobin A1C Latest Ref Range: 4.8 - 5.6 % 6.6 (H)  (142 mg/dl)     Admit with: COVID +/ CHF Flare  NO History of Diabetes noted  Current Orders: Novolog Sensitive Correction Scale/ SSI (0-9 units) TID AC + HS     Decadron stopped--Last dose given AM of 11/19  Creatinine rising--Acute on Chronic renal failure.     --Will follow patient during hospitalization--  Wyn Quaker RN, MSN, CDE Diabetes Coordinator Inpatient Glycemic Control Team Team Pager: [phone] (8a-5p)

## 2019-10-18 NOTE — Progress Notes (Signed)
ANTICOAGULATION CONSULT NOTE  Pharmacy Consult for Heparin Indication: atrial fibrillation  No Known Allergies  Patient Measurements: Height: 5\' 3"  (160 cm) Weight: 259 lb 4.2 oz (117.6 kg) IBW/kg (Calculated) : 52.4 Heparin Dosing Weight: 79.8 kg  Vital Signs: Temp: 98.1 F (36.7 C) (11/23 0031) Temp Source: Oral (11/23 0031) BP: 102/50 (11/23 0600) Pulse Rate: 94 (11/23 0600)  Labs: Recent Labs    10/16/19 0341 10/17/19 0519 10/17/19 0520  10/17/19 1005 10/17/19 1658 10/18/19 0439  HGB 8.1* 7.5*  --   --   --   --  8.4*  HCT 26.6* 24.6*  --   --   --   --  26.8*  PLT 200 200  --   --   --   --  227  APTT 56* 92*  --   --   --   --   --   HEPARINUNFRC 0.39  --  0.34  --   --   --  0.20*  CREATININE 2.66* 2.63*  --    < > 2.91* 3.07* 3.47*   < > = values in this interval not displayed.    Estimated Creatinine Clearance: 18.2 mL/min (A) (by C-G formula based on SCr of 3.47 mg/dL (H)).   Medical History: Past Medical History:  Diagnosis Date  . A-fib ([place])   . Cardiomyopathy ([place])   . CHF (congestive heart failure) ([place])   . Edema   . Hypertension   . Pulmonary hypertension ([place])   . Sleep apnea   . Tricuspid regurgitation     Medications:  Scheduled:  . allopurinol  300 mg Oral Daily  . atorvastatin  40 mg Oral Daily  . Chlorhexidine Gluconate Cloth  6 each Topical Daily  . insulin aspart  0-5 Units Subcutaneous QHS  . insulin aspart  0-9 Units Subcutaneous TID WC  . mouth rinse  15 mL Mouth Rinse BID  . multivitamin with minerals  1 tablet Oral Daily  . sodium chloride flush  10-40 mL Intracatheter Q12H  . sodium chloride flush  3 mL Intravenous Q12H  . vitamin C  500 mg Oral Daily  . zinc sulfate  220 mg Oral Daily    Assessment: 35 yof presenting with SOB and weight gain - on apixaban PTA, has been on hold for mitral/tricupsid valve replacement scheduled for 11/18 (last dose on 11/8). Also found to be COVID +. D-dimer elevated but trending down.  LE dopplers and V/Q scan neg for PE/DVT.   Heparin level (0.2) subtherapeutic this AM on drip rate 650 units/hr. Hgb improved from 7.5 to 8.4 today. plts remain wnl at 227. No overt bleeding or infusion issues per RN.   Goal of Therapy:  Heparin level 0.3-0.7 units/ml  APTT 66-102 seconds Monitor platelets by anticoagulation protocol: Yes   Plan:  Increase heparin to 750 units/hr Check 8-hr heparin level after restarting heparin post-HD cath placement Daily heparin level, CBC Monitor for signs of bleeding  Richardine Service, PharmD PGY1 Pharmacy Resident Phone: [phone] 10/18/2019  2:20 PM  Please check [email or web address] for unit-specific pharmacy phone numbers.

## 2019-10-18 NOTE — Progress Notes (Addendum)
ANTICOAGULATION CONSULT NOTE  Pharmacy Consult for Heparin Indication: atrial fibrillation  No Known Allergies  Patient Measurements: Height: 5\' 3"  (160 cm) Weight: 259 lb 4.2 oz (117.6 kg) IBW/kg (Calculated) : 52.4 Heparin Dosing Weight: 79.8 kg  Vital Signs: BP: 127/100 (11/23 2000) Pulse Rate: 113 (11/23 2100)  Labs: Recent Labs    10/16/19 0341 10/17/19 0519 10/17/19 0520  10/17/19 1658 10/18/19 0439 10/18/19 1613 10/18/19 2000  HGB 8.1* 7.5*  --   --   --  8.4*  --   --   HCT 26.6* 24.6*  --   --   --  26.8*  --   --   PLT 200 200  --   --   --  227  --   --   APTT 56* 92*  --   --   --   --   --   --   HEPARINUNFRC 0.39  --  0.34  --   --  0.20*  --  0.25*  CREATININE 2.66* 2.63*  --    < > 3.07* 3.47* 3.57*  --    < > = values in this interval not displayed.    Estimated Creatinine Clearance: 17.7 mL/min (A) (by C-G formula based on SCr of 3.57 mg/dL (H)).   Medical History: Past Medical History:  Diagnosis Date  . A-fib ([place])   . Cardiomyopathy ([place])   . CHF (congestive heart failure) ([place])   . Edema   . Hypertension   . Pulmonary hypertension ([place])   . Sleep apnea   . Tricuspid regurgitation     Assessment: 73 yr old female presenting with SOB and weight gain - on apixaban PTA, on hold for mitral/tricupsid valve replacement scheduled for 11/18 (last dose on 11/8). Also found to be COVID +. D-dimer elevated, but trending down. LE dopplers and V/Q scan neg for PE/DVT.   Heparin level (0.2 units/ml) subtherapeutic this AM on infusion rate of  650 units/hr. Hgb improved from 7.5 to 8.4 today; platelets WNL at 227. Heparin infusion was increased to 750 units/hr earlier today; however, heparin IV was stopped for ~5 hrs for HD catheter placement. Heparin level drawn ~5 hrs after infusion was restarted was 0.25 units/ml, which is below the desired goal range. Per RN, no issues with IV or bleeding observed.  Goal of Therapy:  Heparin level 0.3-0.7 units/ml   APTT 66-102 seconds Monitor platelets by anticoagulation protocol: Yes   Plan:  Increase heparin to 900 units/hr Check 8-hr heparin level Daily heparin level, CBC Monitor for signs of bleeding  Gillermina Hu, PharmD, BCPS, [facility] Clinical Pharmacist 10/18/2019  9:19 PM

## 2019-10-19 DIAGNOSIS — N179 Acute kidney failure, unspecified: Secondary | ICD-10-CM | POA: Diagnosis not present

## 2019-10-19 DIAGNOSIS — R0600 Dyspnea, unspecified: Secondary | ICD-10-CM

## 2019-10-19 DIAGNOSIS — I509 Heart failure, unspecified: Secondary | ICD-10-CM | POA: Diagnosis not present

## 2019-10-19 DIAGNOSIS — I5043 Acute on chronic combined systolic (congestive) and diastolic (congestive) heart failure: Secondary | ICD-10-CM | POA: Diagnosis not present

## 2019-10-19 DIAGNOSIS — U071 COVID-19: Secondary | ICD-10-CM

## 2019-10-19 LAB — RENAL FUNCTION PANEL
Albumin: 3 g/dL — ABNORMAL LOW (ref 3.5–5.0)
Albumin: 3 g/dL — ABNORMAL LOW (ref 3.5–5.0)
Anion gap: 13 (ref 5–15)
Anion gap: 9 (ref 5–15)
BUN: 35 mg/dL — ABNORMAL HIGH (ref 8–23)
BUN: 24 mg/dL — ABNORMAL HIGH (ref 8–23)
CO2: 26 mmol/L (ref 22–32)
CO2: 26 mmol/L (ref 22–32)
Calcium: 9 mg/dL (ref 8.9–10.3)
Calcium: 8.4 mg/dL — ABNORMAL LOW (ref 8.9–10.3)
Chloride: 92 mmol/L — ABNORMAL LOW (ref 98–111)
Chloride: 97 mmol/L — ABNORMAL LOW (ref 98–111)
Creatinine, Ser: 2.56 mg/dL — ABNORMAL HIGH (ref 0.44–1.00)
Creatinine, Ser: 1.93 mg/dL — ABNORMAL HIGH (ref 0.44–1.00)
GFR calc Af Amer: 21 mL/min — ABNORMAL LOW (ref >60)
GFR calc Af Amer: 29 mL/min — ABNORMAL LOW (ref >60)
GFR calc non Af Amer: 18 mL/min — ABNORMAL LOW (ref >60)
GFR calc non Af Amer: 25 mL/min — ABNORMAL LOW (ref >60)
Glucose, Bld: 114 mg/dL — ABNORMAL HIGH (ref 70–99)
Glucose, Bld: 112 mg/dL — ABNORMAL HIGH (ref 70–99)
Phosphorus: 3.5 mg/dL (ref 2.5–4.6)
Phosphorus: 2.4 mg/dL — ABNORMAL LOW (ref 2.5–4.6)
Potassium: 4.1 mmol/L (ref 3.5–5.1)
Potassium: 4.2 mmol/L (ref 3.5–5.1)
Sodium: 131 mmol/L — ABNORMAL LOW (ref 135–145)
Sodium: 132 mmol/L — ABNORMAL LOW (ref 135–145)

## 2019-10-19 LAB — GLUCOSE, CAPILLARY
Glucose-Capillary: 121 mg/dL — ABNORMAL HIGH (ref 70–99)
Glucose-Capillary: 103 mg/dL — ABNORMAL HIGH (ref 70–99)
Glucose-Capillary: 118 mg/dL — ABNORMAL HIGH (ref 70–99)
Glucose-Capillary: 123 mg/dL — ABNORMAL HIGH (ref 70–99)
Glucose-Capillary: 115 mg/dL — ABNORMAL HIGH (ref 70–99)

## 2019-10-19 LAB — CBC
HCT: 28.4 % — ABNORMAL LOW (ref 36.0–46.0)
Hemoglobin: 8.7 g/dL — ABNORMAL LOW (ref 12.0–15.0)
MCH: 23.8 pg — ABNORMAL LOW (ref 26.0–34.0)
MCHC: 30.6 g/dL (ref 30.0–36.0)
MCV: 77.6 fL — ABNORMAL LOW (ref 80.0–100.0)
Platelets: 227 10*3/uL (ref 150–400)
RBC: 3.66 MIL/uL — ABNORMAL LOW (ref 3.87–5.11)
RDW: 18.3 % — ABNORMAL HIGH (ref 11.5–15.5)
WBC: 6 10*3/uL (ref 4.0–10.5)
nRBC: 1 % — ABNORMAL HIGH (ref 0.0–0.2)

## 2019-10-19 LAB — COOXEMETRY PANEL
Carboxyhemoglobin: 1.7 % — ABNORMAL HIGH (ref 0.5–1.5)
Methemoglobin: 1.6 % — ABNORMAL HIGH (ref 0.0–1.5)
O2 Saturation: 59 %
Total hemoglobin: 9 g/dL — ABNORMAL LOW (ref 12.0–16.0)

## 2019-10-19 LAB — HEPARIN LEVEL (UNFRACTIONATED): Heparin Unfractionated: 0.47 IU/mL (ref 0.30–0.70)

## 2019-10-19 LAB — MAGNESIUM: Magnesium: 2.6 mg/dL — ABNORMAL HIGH (ref 1.7–2.4)

## 2019-10-19 MED ORDER — POLYETHYLENE GLYCOL 3350 17 G PO PACK
17.0000 g | PACK | Freq: Every day | ORAL | Status: DC | PRN
  Administered 2019-10-19: 17 g via ORAL
  Filled 2019-10-19: qty 1

## 2019-10-19 MED ORDER — DARBEPOETIN ALFA 40 MCG/0.4ML IJ SOSY
40.0000 ug | PREFILLED_SYRINGE | Freq: Once | INTRAMUSCULAR | Status: DC
  Filled 2019-10-19 (×3): qty 0.4

## 2019-10-19 MED ORDER — SODIUM PHOSPHATES 45 MMOLE/15ML IV SOLN
20.0000 mmol | Freq: Once | INTRAVENOUS | Status: AC
  Administered 2019-10-19: 20 mmol via INTRAVENOUS
  Filled 2019-10-19: qty 6.67

## 2019-10-19 MED ORDER — WHITE PETROLATUM EX OINT
TOPICAL_OINTMENT | CUTANEOUS | Status: DC | PRN
  Administered 2019-10-21: 0.2 via TOPICAL
  Filled 2019-10-19: qty 28.35

## 2019-10-19 MED ORDER — SODIUM CHLORIDE 0.9 % IV SOLN
INTRAVENOUS | Status: DC | PRN
  Administered 2019-10-19 – 2019-10-25 (×5): via INTRAVENOUS

## 2019-10-19 MED ORDER — DARBEPOETIN ALFA 40 MCG/0.4ML IJ SOSY
40.0000 ug | PREFILLED_SYRINGE | Freq: Once | INTRAMUSCULAR | Status: AC
  Administered 2019-10-19: 40 ug via SUBCUTANEOUS
  Filled 2019-10-19: qty 0.4

## 2019-10-19 NOTE — Progress Notes (Signed)
Physical Therapy Treatment Patient Details Name: Melissa Adams MRN: [id number] DOB: 1946/01/17 Today's Date: 10/19/2019    History of Present Illness 73 y.o. female with a medical history of heart failure, atrial fibrillation, hypertension, sleep apnea, who presented to the emergency department with complaints of shortness of breath and weight gain. 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 In ED found to have an elevated BNP with dyspnea on exertion. CXR showed small right pleural effusion with bibasilar atelectasis. Admitted 10/11/19    PT Comments    Pt is eager to participate in therapy today, attempted to get pt to EoB but limited by IV pole outside the door while on CRRT. Pt agreeable to therapeutic exercise with bed in chair position. Given pts mobility in bed anticipate d/c plans will remain HHPT. PT will continue to follow acutely.      Follow Up Recommendations  Home health PT;Supervision - Intermittent     Equipment Recommendations  None recommended by PT       Precautions / Restrictions Precautions Precautions: Fall Precaution Comments: CRRT,  Restrictions Weight Bearing Restrictions: No    Mobility  Bed Mobility               General bed mobility comments: attempt to bring to side of bed however limited by CRRT and 2 IVs, bed placed in chair position and performed seated exercise        Balance Overall balance assessment: Mild deficits observed, not formally tested                                          Cognition Arousal/Alertness: Awake/alert Behavior During Therapy: WFL for tasks assessed/performed Overall Cognitive Status: Within Functional Limits for tasks assessed                                        Exercises General Exercises - Upper Extremity Elbow Extension: AROM;Both;10 reps;Seated General Exercises - Lower Extremity Ankle Circles/Pumps: AROM;Both;10 reps;Seated Quad Sets:  AROM;Both;10 reps;Seated Gluteal Sets: AROM;Both;10 reps;Seated Long Arc Quad: AROM;Both;10 reps;Seated Hip ABduction/ADduction: AROM;Both;10 reps;Seated Straight Leg Raises: AROM;Both;10 reps;Seated Low Level/ICU Exercises Elbow Flexion: AROM;Both;10 reps;Seated Other Exercises Other Exercises: shoulder retraction 10x Other Exercises: crunch forward to unsupported back in seated, x10     General Comments General comments (skin integrity, edema, etc.): on RA unable to get good pleth form from SaO2 monitior, pt asymptomatic, HR stable       Pertinent Vitals/Pain Pain Assessment: No/denies pain           PT Goals (current goals can now be found in the care plan section) Acute Rehab PT Goals Patient Stated Goal: go home PT Goal Formulation: With patient Time For Goal Achievement: 10/27/19 Potential to Achieve Goals: Good Progress towards PT goals: Progressing toward goals    Frequency    Min 3X/week      PT Plan Current plan remains appropriate       AM-PAC PT "6 Clicks" Mobility   Outcome Measure  Help needed turning from your back to your side while in a flat bed without using bedrails?: None Help needed moving from lying on your back to sitting on the side of a flat bed without using bedrails?: A Little Help needed moving to and from a bed to a chair (including  a wheelchair)?: A Little Help needed standing up from a chair using your arms (e.g., wheelchair or bedside chair)?: A Little Help needed to walk in hospital room?: A Little Help needed climbing 3-5 steps with a railing? : A Lot 6 Click Score: 18    End of Session   Activity Tolerance: Patient tolerated treatment well Patient left: in bed;with call bell/phone within reach;with nursing/sitter in room(in chair position ) Nurse Communication: Mobility status PT Visit Diagnosis: Unsteadiness on feet (R26.81);Other abnormalities of gait and mobility (R26.89);Muscle weakness (generalized) (M62.81);History of  falling (Z91.81);Difficulty in walking, not elsewhere classified (R26.2);Pain Pain - Right/Left: Right Pain - part of body: (flank)     Time: 4621-9471 PT Time Calculation (min) (ACUTE ONLY): 23 min  Charges:  $Therapeutic Exercise: 23-37 mins                     Amar Sippel B. Migdalia Dk PT, DPT Acute Rehabilitation Services Pager [phone] Office [phone]    [place] 10/19/2019, 10:21 AM

## 2019-10-19 NOTE — Progress Notes (Signed)
CRRT: Per heart failure doctor we can pull up to -150, paged renal to make aware, she would like to hold off on increase till tomorrow, keep order of up to -100.

## 2019-10-19 NOTE — Progress Notes (Signed)
Patient ID: Melissa Adams, female   DOB: 1946/01/07, 73 y.o.   MRN: [id number]     Advanced Heart Failure Rounding Note  PCP-Cardiologist: No primary care provider on file.   Subjective:    CVVH started yesterday, UF 50-100 cc/hr, BP tolerating.  She remains on amiodarone gtt 60 mg/hr, milrinone 0.25 mcg/kg/min.  Co-ox 59% today.  CVP still > 20.  Net negative -0240 yesterday.   Not short of breath at rest, +orthopnea.   Objective:   Weight Range: 116.1 kg Body mass index is 45.34 kg/m.   Vital Signs:   Temp:  [96.9 F (36.1 C)-97.6 F (36.4 C)] 97.1 F (36.2 C) (11/24 0400) Pulse Rate:  [92-121] 111 (11/24 0730) Resp:  [15-27] 18 (11/24 0730) BP: (79-128)/(43-105) 96/82 (11/24 0730) SpO2:  [77 %-100 %] 92 % (11/24 0730) Weight:  [116.1 kg] 116.1 kg (11/24 0500) Last BM Date: 10/15/19  Weight change: Filed Weights   10/17/19 0500 10/18/19 0500 10/19/19 0500  Weight: 117.6 kg 117.6 kg 116.1 kg    Intake/Output:   Intake/Output Summary (Last 24 hours) at 10/19/2019 0835 Last data filed at 10/19/2019 0800 Gross per 24 hour  Intake 1513.4 ml  Output 2637 ml  Net -1123.6 ml      Physical Exam    General: NAD Neck: JVP 14-16 cm, no thyromegaly or thyroid nodule.  Lungs: Clear to auscultation bilaterally with normal respiratory effort. CV: Lateral PMI.  Heart irregular S1/S2, no S3/S4, 2/6 HSM LLSB.  1+ ankle edema.   Abdomen: Soft, nontender, no hepatosplenomegaly, no distention.  Skin: Intact without lesions or rashes.  Neurologic: Alert and oriented x 3.  Psych: Normal affect. Extremities: No clubbing or cyanosis.  HEENT: Normal.    Telemetry   Atrial fibrillation rate 100s (personally reviewed).   Labs    CBC Recent Labs    10/18/19 0439 10/19/19 0517  WBC 7.0 6.0  NEUTROABS 5.0  --   HGB 8.4* 8.7*  HCT 26.8* 28.4*  MCV 74.9* 77.6*  PLT 227 973   Basic Metabolic Panel Recent Labs    10/18/19 0439 10/18/19 1613 10/19/19 0517  NA 124*  124* 131*  K 4.0 4.2 4.1  CL 82* 82* 92*  CO2 29 25 26   GLUCOSE 101* 103* 114*  BUN 58* 59* 35*  CREATININE 3.47* 3.57* 2.56*  CALCIUM 8.9 9.0 9.0  MG 2.5*  --  2.6*  PHOS  --  5.2* 3.5   Liver Function Tests Recent Labs    10/18/19 1613 10/19/19 0517  ALBUMIN 2.9* 3.0*   No results for input(s): LIPASE, AMYLASE in the last 72 hours. Cardiac Enzymes No results for input(s): CKTOTAL, CKMB, CKMBINDEX, TROPONINI in the last 72 hours.  BNP: BNP (last 3 results) Recent Labs    10/11/19 1309  BNP 3,374.7*    ProBNP (last 3 results) No results for input(s): PROBNP in the last 8760 hours.   D-Dimer Recent Labs    10/17/19 0519 10/18/19 1317  DDIMER 1.16* 0.83*   Hemoglobin A1C Recent Labs    10/17/19 0817  HGBA1C 6.6*   Fasting Lipid Panel No results for input(s): CHOL, HDL, LDLCALC, TRIG, CHOLHDL, LDLDIRECT in the last 72 hours. Thyroid Function Tests No results for input(s): TSH, T4TOTAL, T3FREE, THYROIDAB in the last 72 hours.  Invalid input(s): FREET3  Other results:   Imaging    Dg Chest Port 1 View  Result Date: 10/18/2019 CLINICAL DATA:  Status post central line placement today. EXAM: PORTABLE CHEST 1 VIEW  COMPARISON:  Single-view of the chest 10/14/2019. FINDINGS: New right IJ catheter is in place with the tip projecting in the mid superior vena cava. Left PICC is unchanged. No pneumothorax. There are right greater than left pleural effusions and airspace disease which are new since the prior examination. Marked cardiomegaly is again seen. Atherosclerosis is noted. IMPRESSION: Right IJ catheter tip projects in the mid superior vena cava. Negative for pneumothorax. Right greater than left pleural effusions with associated airspace disease which could be due to atelectasis pneumonia. Cardiomegaly. Atherosclerosis. Electronically Signed   By: Inge Rise M.D.   On: 10/18/2019 12:13     Medications:     Scheduled Medications: . allopurinol  300  mg Oral Daily  . atorvastatin  40 mg Oral Daily  . Chlorhexidine Gluconate Cloth  6 each Topical Daily  . darbepoetin (ARANESP) injection - DIALYSIS  40 mcg Intravenous Once  . insulin aspart  0-5 Units Subcutaneous QHS  . insulin aspart  0-9 Units Subcutaneous TID WC  . mouth rinse  15 mL Mouth Rinse BID  . multivitamin with minerals  1 tablet Oral Daily  . vitamin C  500 mg Oral Daily  . zinc sulfate  220 mg Oral Daily    Infusions: .  prismasol BGK 4/2.5 400 mL/hr at 10/19/19 0209  .  prismasol BGK 4/2.5 300 mL/hr at 10/19/19 0626  . amiodarone 60 mg/hr (10/19/19 0800)  . heparin 900 Units/hr (10/19/19 0800)  . milrinone 0.25 mcg/kg/min (10/19/19 0800)  . prismasol BGK 4/2.5 1,800 mL/hr at 10/19/19 0625    PRN Medications: acetaminophen, cyclobenzaprine, heparin, ondansetron (ZOFRAN) IV, sodium chloride flush, traMADol   Assessment/Plan   1. Acute on chronic systolic CHF:  Nonischemic cardiomyopathy, cath in [place] in 9/20 with no significant coronary disease.  Echo with EF 20-25% with dilated and severely dysfunctional RV with D-shaped interventricular septum.  MR appears moderate and TR appears moderate.  With dilated RV, V/Q scan was done and was negative.  Biventricular failure.  Last echo in [place] per report showed EF 40-45%.  Cause of fall in EF uncertain, could be due to atrial fibrillation with RVR (tachycardia-mediated).  Apparently chronic AF but last clinic note from outpatient cardiologist outlines plan for possible atrial fibrillation ablation.  Myocarditis, potentially from COVID-19, is possible.  It is also possible that she has a stress cardiomyopathy due to medical illness/COVID-19 infection.  I have been concerned for low output HF with rise in creatinine and difficulty with diuresis.  Started milrinone at 0.25, HR generally in 100s currently.  Co-ox has been good on milrinone.  With ongoing poor diuresis, CVVH started 11/23 and Lasix gtt stopped.  UF 50-100 cc/hr with  CVP > 20 currently.  - Aim for net UF 100-150 cc/hr.  - Continue milrinone 0.25 mcg/kg/min.  2. Valvular heart disease: Patient was supposed to have mitral and tricuspid valve replacements this week.  TEE in 9/20 in [place] showed mod-severe MR and severe TR.  On my read of the echo done here, she has moderate MR and moderate TR (both central and likely functional).  Her main issue here seems to be biventricular failure and would hold off on valve procedures for the time being.  3. Atrial fibrillation: Chronic per outpatient notes. Per daughter, she has had cardioversions in the past. There apparently has been consideration for atrial fibrillation ablation. She remains in RVR but rate better-controlled today (100s this morning).  - Continue amiodarone 60 mg/hr.  - Continue heparin gtt for  now.  - Eventually I think she will need TEE-DCCV.  Would try to get her off milrinone first and will need to be more diuresed to have best chance to keep her in NSR.  Procedure will be complicated by ESLPN-30+.   4. COVID-19 infection: No PNA on CXR.  On supplemental O2 but this may be due to CHF. Seems to be doing ok from this standpoint. Finished remdesivir.  5. AKI: Per report, baseline creatinine around 1.2, increased up to 3.47.  Suspect primarily cardiorenal. She is volume overloaded and did not diurese adequately, so CVVH started.  - As above, continue CVVH aiming for 100-150 cc net UF.  Hopefully renal function will recover once volume is removed.  6. Hyponatremia: Hypervolemic hypernatremia.  She got tolvaptan on 11/22.  Improved with CVVH.  CRITICAL CARE Performed by: [name]  Total critical care time: 35 minutes  Critical care time was exclusive of separately billable procedures and treating other patients.  Critical care was necessary to treat or prevent imminent or life-threatening deterioration.  Critical care was time spent personally by me on the following activities: development of  treatment plan with patient and/or surrogate as well as nursing, discussions with consultants, evaluation of patient's response to treatment, examination of patient, obtaining history from patient or surrogate, ordering and performing treatments and interventions, ordering and review of laboratory studies, ordering and review of radiographic studies, pulse oximetry and re-evaluation of patient's condition.   Length of Stay: 7  [name], MD  10/19/2019, 8:35 AM  Advanced Heart Failure Team Pager [phone] (M-F; 7a - 4p)  Please contact [facility] for night-coverage after hours (4p -7a ) and weekends on [email or web address]

## 2019-10-19 NOTE — Progress Notes (Addendum)
[facility] Progress Note    Assessment/ Plan:   1.  AKI on CKD 3: baseline Cr reportedly 1.2.  Likely d/t decompensated CHF.  UA with 6-10 RBCs and no WBCs, UP/C 0.2, not really reflective of GN or COVID AKI. Renal US neg.   Cr uptrending in setting of worsening hemodynamics, Afib, and low BP--> cardiogenic shock picture.  Didn't really diurese well despite milrinone and Lasix gtt.   - Continue CRRT   2.  Severe biventricular CHF: was due for MV and TV replacement in [place] on 11/18.  TTE performed--> Biventricular failure with EF 20-25%.  Advanced HF following, appreciate.  milrinone, Lasix gtt  3.  COVID + : on supplemental O2 on hep gtt, s/p remdesivir  4.  Afib; was on metop and eliquis as OP, now on amio gtt and hep gtt.  5.  Hyponatremia: improving; s/p tolvaptan and on CRRT   6. Anemia - iron deficiency and AKI - no acute indication for PRBC's. For aranesp 40 mcg on 11/24  7.  Dispo: in ICU   Subjective:    Started on CRRT on 11/23 afternoon via nontunneled right IJ placed by critical care.  Now up to 50 to 100 cc/hr net negative and tolerating.  Lasix gtt off and on amio, milrinone and heparin.   Had 825 mL uop over 11/23.  Review of systems: per nursing  Denies shortness of breath - has been on 2 liters for comfort but sats ok on room air Denies chest pain  Denies n/v       Objective:   BP 112/70   Pulse (!) 104   Temp (!) 97.1 F (36.2 C) (Oral)   Resp (!) 25   Ht 5\' 3"  (1.6 m)   Wt 116.1 kg   SpO2 100%   BMI 45.34 kg/m   Intake/Output Summary (Last 24 hours) at 10/19/2019 3662 Last data filed at 10/19/2019 0600 Gross per 24 hour  Intake 1550.11 ml  Output 2434 ml  Net -883.89 ml   Weight change: -1.5 kg  Physical Exam:  Personally examined on 11/23 with appropriate PPE.  Exam per nursing on 11/24 and viewed through door  Due to the nature of this patient's suspected COVID-19 with isolation and in keeping with efforts to prevent the  spread of infection and to conserve personal protective equipment, a physical exam was not personally performed.  Patient's symptoms and exam were discussed in detail with the RN.  A chart review of other providers notes and the patient's lab work as well as review of other pertinent studies was performed.  Exam details from prior documentation were reviewed specifically and confirmed with the bedside nurse.  Location of service: [place] adult female in bed in no acute distress HEENT normocephalic atraumatic extraocular movements intact sclera anicteric Neck supple trachea midline Lungs clear but diminshed on lower lobes on exam; on 2 liters oxygen Heart tachy and irregular/afib  Abdomen soft nontender nondistended Extremities unna boots on feet with 1-2+ edema of thighs  Psych normal mood and affect Access RIJ nontunneled catheter   Imaging: Dg Chest Port 1 View  Result Date: 10/18/2019 CLINICAL DATA:  Status post central line placement today. EXAM: PORTABLE CHEST 1 VIEW COMPARISON:  Single-view of the chest 10/14/2019. FINDINGS: New right IJ catheter is in place with the tip projecting in the mid superior vena cava. Left PICC is unchanged. No pneumothorax. There are right greater than left pleural effusions and airspace  disease which are new since the prior examination. Marked cardiomegaly is again seen. Atherosclerosis is noted. IMPRESSION: Right IJ catheter tip projects in the mid superior vena cava. Negative for pneumothorax. Right greater than left pleural effusions with associated airspace disease which could be due to atelectasis pneumonia. Cardiomegaly. Atherosclerosis. Electronically Signed   By: Inge Rise M.D.   On: 10/18/2019 12:13    Labs: BMET Recent Labs  Lab 10/14/19 1431  10/17/19 0519 10/17/19 0817 10/17/19 1005 10/17/19 1658 10/18/19 0439 10/18/19 1613 10/19/19 0517  NA 135   < > 119* 120* 127* 125* 124* 124* 131*  K 3.7   < > 3.7 3.7  4.1 4.0 4.0 4.2 4.1  CL 92*   < > 76* 78* 84* 84* 82* 82* 92*  CO2 28   < > 27 27 28 27 29 25 26   GLUCOSE 122*   < > 455* 377* 110* 120* 101* 103* 114*  BUN 52*   < > 48* 50* 53* 56* 58* 59* 35*  CREATININE 2.35*   < > 2.63* 2.70* 2.91* 3.07* 3.47* 3.57* 2.56*  CALCIUM 9.2   < > 8.4* 8.4* 9.2 9.1 8.9 9.0 9.0  PHOS 3.7  --   --   --   --   --   --  5.2* 3.5   < > = values in this interval not displayed.   CBC Recent Labs  Lab 10/16/19 0341 10/17/19 0519 10/18/19 0439 10/19/19 0517  WBC 6.8 6.6 7.0 6.0  NEUTROABS  --   --  5.0  --   HGB 8.1* 7.5* 8.4* 8.7*  HCT 26.6* 24.6* 26.8* 28.4*  MCV 77.8* 78.3* 74.9* 77.6*  PLT 200 200 227 227    Medications:    . allopurinol  300 mg Oral Daily  . atorvastatin  40 mg Oral Daily  . Chlorhexidine Gluconate Cloth  6 each Topical Daily  . insulin aspart  0-5 Units Subcutaneous QHS  . insulin aspart  0-9 Units Subcutaneous TID WC  . mouth rinse  15 mL Mouth Rinse BID  . multivitamin with minerals  1 tablet Oral Daily  . vitamin C  500 mg Oral Daily  . zinc sulfate  220 mg Oral Daily    Claudia Desanctis  10/19/2019, 6:28 AM

## 2019-10-19 NOTE — Progress Notes (Signed)
ANTICOAGULATION CONSULT NOTE  Pharmacy Consult for Heparin Indication: atrial fibrillation  Patient Measurements: Height: 5\' 3"  (160 cm) Weight: 255 lb 15.3 oz (116.1 kg) IBW/kg (Calculated) : 52.4 Heparin Dosing Weight: 79.8 kg  Vital Signs: Temp: 97.1 F (36.2 C) (11/24 0400) Temp Source: Oral (11/24 0400) BP: 96/82 (11/24 0730) Pulse Rate: 111 (11/24 0730)  Labs: Recent Labs    10/17/19 0519  10/18/19 0439 10/18/19 1613 10/18/19 2000 10/19/19 0517  HGB 7.5*  --  8.4*  --   --  8.7*  HCT 24.6*  --  26.8*  --   --  28.4*  PLT 200  --  227  --   --  227  APTT 92*  --   --   --   --   --   HEPARINUNFRC  --    < > 0.20*  --  0.25* 0.47  CREATININE 2.63*   < > 3.47* 3.57*  --  2.56*   < > = values in this interval not displayed.    Estimated Creatinine Clearance: 24.4 mL/min (A) (by C-G formula based on SCr of 2.56 mg/dL (H)).   Medical History: Past Medical History:  Diagnosis Date  . A-fib ([place])   . Cardiomyopathy ([place])   . CHF (congestive heart failure) ([place])   . Edema   . Hypertension   . Pulmonary hypertension ([place])   . Sleep apnea   . Tricuspid regurgitation     Assessment: 73 yr old female presenting with SOB and weight gain - on apixaban PTA, on hold for mitral/tricupsid valve replacement scheduled for 11/18 (last dose on 11/8). Also found to be COVID +. D-dimer elevated, but trending down. LE dopplers and V/Q scan neg for PE/DVT.   Heparin level therapeutic this AM on infusion rate of 900 units/hr. Hgb improved from 7.5 to 8.7 today; platelets WNL at 227. Heparin was paused yesterday for line placement and was resumed yesterday afternoon.   Goal of Therapy:  Heparin level 0.3-0.7 units/ml  Monitor platelets by anticoagulation protocol: Yes    Plan:  Increase heparin to 900 units/hr Daily heparin level, CBC Monitor for signs of bleeding    Harvel Quale  10/19/2019 8:59 AM

## 2019-10-19 NOTE — Progress Notes (Signed)
NAME:  Melissa Adams, MRN:  [id number], DOB:  1946-02-12, LOS: 7 ADMISSION DATE:  10/11/2019, CONSULTATION DATE:  11/23 REFERRING MD: Royce Macadamia  , CHIEF COMPLAINT:  Worsening renal failure in setting of biventricular heart failure    Brief History   73 year old female admitted w/ acute biventricular heart failure. EF decreased from 40-45% down to 20-25% (unclear if this is rate related or due to myocarditis from [place]) Incidentally COVID-19 positive.   PCCM asked to see 11/23 for progressive cardio-renal syndrome and need for HD on 11/23  History of present illness   73 year old female w/ hx as mentioned below. Admitted 11/16 w/ cc: weight gain (22 lbs) and worsening shortness of breath. BNP elevated. Admitted w/ working dx of decompensated HF and hyponatremia.  ->incidentally fund to be COVID-19 positive.  Started on Remdesivir (steroids stopped as felt mostly HF) and lasix 11/18; Started on milrinone w/ lasix and PICC placed for CVP. 11/20 to ICU for close obs 11/21 got tolvaptan per cards Na 128 felt d/t volume overload 11/22 lasix gtt. Repeated Tolvaptan  11/23 renal fxn worse, still volume overloaded. 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 11/20 to ICU for close obs 11/21 got tolvaptan per cards Na 128 felt d/t volume overload 11/22 lasix gtt. Repeated Tolvaptan  11/23 renal fxn worse, still volume overloaded. PCCM asked to see for HD cath and assist w/ RX  Consults:  11/16 cards 11/17 renal 11/18 heart failure  11/23 critical care   Procedures:  HD cath 11/23 >   Significant Diagnostic Tests:  11/17 echo:  EF 20-25%, dilated LV, enlarged RV/LA/RA.  Micro Data:  11/16  and 11/17 COVID: positive  Antimicrobials:  Remdesivir 11/17-->11/22 Interim history/subjective:  No acute events.  CRRT ongoing. Objective   Blood pressure 96/82, pulse (!) 111, temperature (!) 97.1 F (36.2 C), temperature source Oral, resp. rate 18, height 5\' 3"  (1.6 m), weight 116.1 kg, SpO2 92 %. CVP:  [13 mmHg-51 mmHg] 23 mmHg      Intake/Output Summary (Last 24 hours) at 10/19/2019 0842 Last data filed at 10/19/2019 0800 Gross per 24 hour  Intake 1513.4 ml  Output 2637 ml  Net -1123.6 ml   Filed Weights   10/17/19 0500 10/18/19 0500 10/19/19 0500  Weight: 117.6 kg 117.6 kg 116.1 kg    Examination: General: Adult female, sitting up in bed, in NAD. Neuro: A&O x 3, no deficits. HEENT: [place]/AT. Sclerae anicteric. EOMI. Cardiovascular: IRIR, no M/R/G.  Lungs: Respirations even and unlabored.  Faint basilar crackles. Abdomen: BS x 4, soft, NT/ND.  Musculoskeletal: No gross deformities, no edema.  Skin: Intact, warm, no rashes.  Assessment & Plan:   Acute on chronic biventricular heart failure w/ EF down to 20-25% (baseline was 40-45%); c/b known mod TR and  MR.  -? Rate related CM or myocarditis from COVID -has not responded to inotropes and diuretics.  Plan Continue volume removal per CRRT (UF 50-100 cc/hr currently). Cont inotrope support as directed by HF team  AF w/ RVR Plan Cont amiodarone per cards IV heparin HF considering cardioversion at some point though procedure will be complicated by  COVID 19+ status  Dyspnea 2/2 HF + COVID 19 Plan Continue supplemental oxygen  Continue volume removal per CRRT  AKI ->cardiorenal syndrome in setting of decompensated HF Hypo-osmolar Hyponatremia in setting of heart failure  Plan Continue CRRT per nephrology  (UF 50-100cc/hr) Renal dose meds Strict I&O Maximize CO  Anemia of chronic disease  Plan Trend cbc Transfuse for hgb < 7  COVID-19 infection She is s/p 5 days remdesivir  Plan Cont respiratory  isolation  Best practice:  Diet:HF diet  Pain/Anxiety/Delirium protocol (if indicated): NA VAP protocol (if indicated): na DVT prophylaxis: iv HEPARIN  GI prophylaxis: NA Glucose control: ssi Mobility: w/ assist Code Status: full code  Family Communication: pending  Disposition: on-going ICU care   CC time: 35 min.   Montey Hora, [facility] 10/19/2019, 8:50 AM

## 2019-10-20 DIAGNOSIS — I509 Heart failure, unspecified: Secondary | ICD-10-CM | POA: Diagnosis not present

## 2019-10-20 DIAGNOSIS — U071 COVID-19: Secondary | ICD-10-CM

## 2019-10-20 DIAGNOSIS — I5043 Acute on chronic combined systolic (congestive) and diastolic (congestive) heart failure: Secondary | ICD-10-CM | POA: Diagnosis not present

## 2019-10-20 DIAGNOSIS — N179 Acute kidney failure, unspecified: Secondary | ICD-10-CM | POA: Diagnosis not present

## 2019-10-20 LAB — GLUCOSE, CAPILLARY
Glucose-Capillary: 90 mg/dL (ref 70–99)
Glucose-Capillary: 107 mg/dL — ABNORMAL HIGH (ref 70–99)
Glucose-Capillary: 129 mg/dL — ABNORMAL HIGH (ref 70–99)
Glucose-Capillary: 99 mg/dL (ref 70–99)

## 2019-10-20 LAB — RENAL FUNCTION PANEL
Albumin: 3.1 g/dL — ABNORMAL LOW (ref 3.5–5.0)
Albumin: 3 g/dL — ABNORMAL LOW (ref 3.5–5.0)
Anion gap: 10 (ref 5–15)
Anion gap: 8 (ref 5–15)
BUN: 17 mg/dL (ref 8–23)
BUN: 12 mg/dL (ref 8–23)
CO2: 27 mmol/L (ref 22–32)
CO2: 26 mmol/L (ref 22–32)
Calcium: 8.6 mg/dL — ABNORMAL LOW (ref 8.9–10.3)
Calcium: 8.7 mg/dL — ABNORMAL LOW (ref 8.9–10.3)
Chloride: 94 mmol/L — ABNORMAL LOW (ref 98–111)
Chloride: 96 mmol/L — ABNORMAL LOW (ref 98–111)
Creatinine, Ser: 1.75 mg/dL — ABNORMAL HIGH (ref 0.44–1.00)
Creatinine, Ser: 1.56 mg/dL — ABNORMAL HIGH (ref 0.44–1.00)
GFR calc Af Amer: 33 mL/min — ABNORMAL LOW (ref >60)
GFR calc Af Amer: 38 mL/min — ABNORMAL LOW (ref >60)
GFR calc non Af Amer: 29 mL/min — ABNORMAL LOW (ref >60)
GFR calc non Af Amer: 33 mL/min — ABNORMAL LOW (ref >60)
Glucose, Bld: 102 mg/dL — ABNORMAL HIGH (ref 70–99)
Glucose, Bld: 111 mg/dL — ABNORMAL HIGH (ref 70–99)
Phosphorus: 2.6 mg/dL (ref 2.5–4.6)
Phosphorus: 1.9 mg/dL — ABNORMAL LOW (ref 2.5–4.6)
Potassium: 4.2 mmol/L (ref 3.5–5.1)
Potassium: 4.3 mmol/L (ref 3.5–5.1)
Sodium: 131 mmol/L — ABNORMAL LOW (ref 135–145)
Sodium: 130 mmol/L — ABNORMAL LOW (ref 135–145)

## 2019-10-20 LAB — BASIC METABOLIC PANEL
Anion gap: 10 (ref 5–15)
BUN: 17 mg/dL (ref 8–23)
CO2: 27 mmol/L (ref 22–32)
Calcium: 8.7 mg/dL — ABNORMAL LOW (ref 8.9–10.3)
Chloride: 94 mmol/L — ABNORMAL LOW (ref 98–111)
Creatinine, Ser: 1.77 mg/dL — ABNORMAL HIGH (ref 0.44–1.00)
GFR calc Af Amer: 33 mL/min — ABNORMAL LOW (ref >60)
GFR calc non Af Amer: 28 mL/min — ABNORMAL LOW (ref >60)
Glucose, Bld: 103 mg/dL — ABNORMAL HIGH (ref 70–99)
Potassium: 4.2 mmol/L (ref 3.5–5.1)
Sodium: 131 mmol/L — ABNORMAL LOW (ref 135–145)

## 2019-10-20 LAB — CBC
HCT: 27 % — ABNORMAL LOW (ref 36.0–46.0)
Hemoglobin: 8.2 g/dL — ABNORMAL LOW (ref 12.0–15.0)
MCH: 23.7 pg — ABNORMAL LOW (ref 26.0–34.0)
MCHC: 30.4 g/dL (ref 30.0–36.0)
MCV: 78 fL — ABNORMAL LOW (ref 80.0–100.0)
Platelets: 215 10*3/uL (ref 150–400)
RBC: 3.46 MIL/uL — ABNORMAL LOW (ref 3.87–5.11)
RDW: 19.2 % — ABNORMAL HIGH (ref 11.5–15.5)
WBC: 6.4 10*3/uL (ref 4.0–10.5)
nRBC: 0.5 % — ABNORMAL HIGH (ref 0.0–0.2)

## 2019-10-20 LAB — COOXEMETRY PANEL
Carboxyhemoglobin: 2.1 % — ABNORMAL HIGH (ref 0.5–1.5)
Methemoglobin: 1.6 % — ABNORMAL HIGH (ref 0.0–1.5)
O2 Saturation: 77.2 %
Total hemoglobin: 8.5 g/dL — ABNORMAL LOW (ref 12.0–16.0)

## 2019-10-20 LAB — MAGNESIUM: Magnesium: 2.6 mg/dL — ABNORMAL HIGH (ref 1.7–2.4)

## 2019-10-20 LAB — HEPARIN LEVEL (UNFRACTIONATED): Heparin Unfractionated: 0.57 IU/mL (ref 0.30–0.70)

## 2019-10-20 MED ORDER — SODIUM PHOSPHATES 45 MMOLE/15ML IV SOLN
30.0000 mmol | Freq: Once | INTRAVENOUS | Status: AC
  Administered 2019-10-20: 30 mmol via INTRAVENOUS
  Filled 2019-10-20: qty 10

## 2019-10-20 NOTE — Progress Notes (Signed)
NAME:  Melissa Adams, MRN:  [id number], DOB:  December 25, 1945, LOS: 8 ADMISSION DATE:  10/11/2019, CONSULTATION DATE:  11/23 REFERRING MD: Royce Macadamia  , CHIEF COMPLAINT:  Worsening renal failure in setting of biventricular heart failure    Brief History   73 year old female admitted w/ acute biventricular heart failure. EF decreased from 40-45% down to 20-25% (unclear if this is rate related or due to myocarditis from [place]) Incidentally COVID-19 positive.   PCCM asked to see 11/23 for progressive cardio-renal syndrome and need for HD on 11/23  History of present illness   73 year old female w/ hx as mentioned below. Admitted 11/16 w/ cc: weight gain (22 lbs) and worsening shortness of breath. BNP elevated. Admitted w/ working dx of decompensated HF and hyponatremia.  ->incidentally fund to be COVID-19 positive.  Started on Remdesivir (steroids stopped as felt mostly HF) and lasix 11/18; Started on milrinone w/ lasix and PICC placed for CVP. 11/20 to ICU for close obs 11/21 got tolvaptan per cards Na 128 felt d/t volume overload 11/22 lasix gtt. Repeated Tolvaptan  11/23 renal fxn worse, still volume overloaded. 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 11/20 to ICU for close obs 11/21 got tolvaptan per cards Na 128 felt d/t volume overload 11/22 lasix gtt. Repeated Tolvaptan  11/23 renal fxn worse, still volume overloaded. PCCM asked to see for HD cath and assist w/ RX  Consults:  11/16 cards 11/17 renal 11/18 heart failure  11/23 critical care   Procedures:  HD cath 11/23 >   Significant Diagnostic Tests:  11/17 echo:  EF 20-25%, dilated LV, enlarged RV/LA/RA.  Micro Data:  11/16  and 11/17 COVID: positive   Antimicrobials:  Remdesivir 11/17-->11/22  Interim history/subjective:  Pt complains of pain in the catheter on her neck.  Denies HA, CP, abdominal pain, n/v/d. States she doesn't want to be on dialysis, understands its necessary at this time.   Objective   Blood pressure 105/65, pulse (!) 107, temperature 98 F (36.7 C), temperature source Oral, resp. rate 20, height 5\' 3"  (1.6 m), weight 119.5 kg, SpO2 96 %. CVP:  [7 mmHg-32 mmHg] 19 mmHg      Intake/Output Summary (Last 24 hours) at 10/20/2019 0922 Last data filed at 10/20/2019 0900 Gross per 24 hour  Intake 1809.39 ml  Output 4244 ml  Net -2434.61 ml   Filed Weights   10/18/19 0500 10/19/19 0500 10/20/19 0500  Weight: 117.6 kg 116.1 kg 119.5 kg    Examination: General: alert and oriented. Mild discomfort.  Neuro: no FND.  HEENT: NCAT. MMM.  Cardiovascular: regular rhythm, tachycardic rate. No murmurs.  Lungs: LCTAB. Decreased breath sounds.  Abdomen: soft, nontender. Normal bowel sounds.  Musculoskeletal: legs wrapped in ace bandage b/l. Edema appreciated b/l on feet and lower legs.  Skin: warm and dry  Assessment & Plan:   Acute on chronic biventricular heart failure w/ EF down to 20-25% (baseline was 40-45%); c/b known mod TR and  MR.  Cause of decrease in EF uncertain. Possibly COVID or chronic AF.    Plan - cardiology following.  - Continue volume removal per CRRT (UF  100 cc/hr currently). - on milrinone - on amiodarone for afib  AF w/ RVR Plan Cont amiodarone per cards IV heparin HF considering cardioversion at some point though procedure will be complicated by COVID 62+ status  Dyspnea 2/2 HF + COVID 19 Plan Continue supplemental oxygen, as needed. Currently on room air.   Continue volume removal per CRRT  AKI ->cardiorenal syndrome in setting of decompensated HF Hypo-osmolar Hyponatremia in setting of heart failure  Plan Continue CRRT per nephrology  (UF 100cc/hr)  Renal dose meds Strict I&O Maximize CO  Anemia of chronic disease  Plan Trend cbc Transfuse for hgb < 7  COVID-19 infection She is s/p 5 days remdesivir  Plan Cont respiratory isolation  Best practice:  Diet:HF diet  Pain/Anxiety/Delirium protocol (if indicated): NA VAP protocol (if indicated): na DVT prophylaxis: iv HEPARIN  GI prophylaxis: NA Glucose control: ssi Mobility: w/ assist Code Status: full code  Family Communication: pending  Disposition: on-going ICU care   CC time: 35 min.   Montey Hora, [facility] 10/20/2019, 9:22 AM

## 2019-10-20 NOTE — Progress Notes (Signed)
[facility] Progress Note    Assessment/ Plan:   1.  AKI on CKD 3: baseline Cr reportedly 1.2.  Likely d/t decompensated CHF.  UA with 6-10 RBCs and no WBCs, UP/C 0.2, not really reflective of GN or COVID AKI. Renal US neg.   Cr uptrending in setting of worsening hemodynamics, Afib, and low BP--> cardiogenic shock picture.  Didn't really diurese well despite milrinone and Lasix gtt.  CRRT on 11/23 after nontunneled catheter with critical care - Continue CRRT, 100 cc/hr net neg as tolerated 4K fluids   2.  Severe biventricular CHF: was due for MV and TV replacement in [place] on 11/18.  TTE performed--> Biventricular failure with EF 20-25%.  Advanced HF following, appreciate.  milrinone  3.  COVID + : therapies per primary team   4.  Afib; was on metop and eliquis as OP, now on amio gtt and hep gtt.  5.  Hyponatremia: improving; on CRRT   6. Anemia - iron deficiency and AKI - no acute indication for PRBC's.  aranesp 40 mcg on 11/24 once   7.  Dispo: in ICU   Subjective:    She has been on room air.  Has continued on CRRT with net negative 100 cc/hr and 3.9 liters off with CRRT over 11/24.  On amio, milrinone and heparin.  On exam she has been on 1.5 liters/hr on CRRT not 1.8  Review of systems:  states shortness of breath is gone Denies chest pain  Denies n/v       Objective:   BP 110/68   Pulse 87   Temp 97.6 F (36.4 C) (Oral)   Resp (!) 21   Ht 5\' 3"  (1.6 m)   Wt 119.5 kg   SpO2 (!) 89%   BMI 46.67 kg/m   Intake/Output Summary (Last 24 hours) at 10/20/2019 7893 Last data filed at 10/20/2019 0500 Gross per 24 hour  Intake 1757.26 ml  Output 4038 ml  Net -2280.74 ml   Weight change: 3.4 kg  Physical Exam: examined on 11/25 with appropriate PPE General adult female in bed in no acute distress HEENT normocephalic atraumatic extraocular movements intact sclera anicteric Neck supple trachea midline Lungs clear but diminshed on exam Heart tachy and  irregular/afib  Abdomen soft nontender nondistended Extremities unna boots on feet with 2+ edema of thighs  Psych normal mood and affect Access RIJ nontunneled catheter   Imaging: Dg Chest Port 1 View  Result Date: 10/18/2019 CLINICAL DATA:  Status post central line placement today. EXAM: PORTABLE CHEST 1 VIEW COMPARISON:  Single-view of the chest 10/14/2019. FINDINGS: New right IJ catheter is in place with the tip projecting in the mid superior vena cava. Left PICC is unchanged. No pneumothorax. There are right greater than left pleural effusions and airspace disease which are new since the prior examination. Marked cardiomegaly is again seen. Atherosclerosis is noted. IMPRESSION: Right IJ catheter tip projects in the mid superior vena cava. Negative for pneumothorax. Right greater than left pleural effusions with associated airspace disease which could be due to atelectasis pneumonia. Cardiomegaly. Atherosclerosis. Electronically Signed   By: Inge Rise M.D.   On: 10/18/2019 12:13    Labs: BMET Recent Labs  Lab 10/14/19 1431  10/17/19 1658 10/18/19 0439 10/18/19 1613 10/19/19 0517 10/19/19 1610 10/20/19 0456 10/20/19 0457  NA 135   < > 125* 124* 124* 131* 132* 131* 131*  K 3.7   < > 4.0 4.0 4.2 4.1 4.2 4.2 4.2  CL  92*   < > 84* 82* 82* 92* 97* 94* 94*  CO2 28   < > 27 29 25 26 26 27 27   GLUCOSE 122*   < > 120* 101* 103* 114* 112* 102* 103*  BUN 52*   < > 56* 58* 59* 35* 24* 17 17  CREATININE 2.35*   < > 3.07* 3.47* 3.57* 2.56* 1.93* 1.75* 1.77*  CALCIUM 9.2   < > 9.1 8.9 9.0 9.0 8.4* 8.6* 8.7*  PHOS 3.7  --   --   --  5.2* 3.5 2.4* 2.6  --    < > = values in this interval not displayed.   CBC Recent Labs  Lab 10/17/19 0519 10/18/19 0439 10/19/19 0517 10/20/19 0457  WBC 6.6 7.0 6.0 6.4  NEUTROABS  --  5.0  --   --   HGB 7.5* 8.4* 8.7* 8.2*  HCT 24.6* 26.8* 28.4* 27.0*  MCV 78.3* 74.9* 77.6* 78.0*  PLT 200 227 227 215    Medications:    . allopurinol  300 mg  Oral Daily  . atorvastatin  40 mg Oral Daily  . Chlorhexidine Gluconate Cloth  6 each Topical Daily  . insulin aspart  0-5 Units Subcutaneous QHS  . insulin aspart  0-9 Units Subcutaneous TID WC  . mouth rinse  15 mL Mouth Rinse BID  . multivitamin with minerals  1 tablet Oral Daily  . vitamin C  500 mg Oral Daily  . zinc sulfate  220 mg Oral Daily    Claudia Desanctis  10/20/2019, 6:35 AM

## 2019-10-20 NOTE — Progress Notes (Signed)
ANTICOAGULATION CONSULT NOTE  Pharmacy Consult for Heparin Indication: atrial fibrillation  Patient Measurements: Height: 5\' 3"  (160 cm) Weight: 263 lb 7.2 oz (119.5 kg) IBW/kg (Calculated) : 52.4 Heparin Dosing Weight: 79.8 kg  Vital Signs: Temp: 98 F (36.7 C) (11/25 0801) Temp Source: Oral (11/25 0801) BP: 124/64 (11/25 0805) Pulse Rate: 120 (11/25 0805)  Labs: Recent Labs    10/18/19 0439  10/18/19 2000 10/19/19 0517 10/19/19 1610 10/20/19 0456 10/20/19 0457  HGB 8.4*  --   --  8.7*  --   --  8.2*  HCT 26.8*  --   --  28.4*  --   --  27.0*  PLT 227  --   --  227  --   --  215  HEPARINUNFRC 0.20*  --  0.25* 0.47  --   --  0.57  CREATININE 3.47*   < >  --  2.56* 1.93* 1.75* 1.77*   < > = values in this interval not displayed.     Medical History: Past Medical History:  Diagnosis Date  . A-fib ([place])   . Cardiomyopathy ([place])   . CHF (congestive heart failure) ([place])   . Edema   . Hypertension   . Pulmonary hypertension ([place])   . Sleep apnea   . Tricuspid regurgitation     Assessment: 73 yr old female presenting with SOB and weight gain - on apixaban PTA, on hold in case patient requires procedure. Last dose of Eliquis was on 11/8. Pt was told by outside hospital that she required mitral/tricupsid valve replacement > this surgery was deferred upon transfer to [facility]. Incidentally found to be COVID +. D-dimer elevated, but trending down. LE dopplers and V/Q scan neg for PE/DVT.   Heparin level therapeutic this AM on infusion rate of 900 units/hr. Hgb 8.2 today; platelets WNL at 215. Additionally, pt is now on CRRT.   Goal of Therapy:  Heparin level 0.3-0.7 units/ml  Monitor platelets by anticoagulation protocol: Yes    Plan:  Continue heparin at 900 units/hr Daily heparin level, CBC Monitor for signs of bleeding    Harvel Quale  10/20/2019 8:51 AM

## 2019-10-20 NOTE — Progress Notes (Signed)
Patient ID: Melissa Adams, female   DOB: 05/09/1946, 73 y.o.   MRN: [id number]     Advanced Heart Failure Rounding Note  PCP-Cardiologist: No primary care provider on file.   Subjective:    CVVH started 11/23, UF 100 cc/hr, BP tolerating.  She remains on amiodarone gtt 60 mg/hr, milrinone 0.25 mcg/kg/min.  Co-ox 77% today.  CVP 25.  Net negative -2439 yesterday.   Not short of breath at rest, +orthopnea.   Objective:   Weight Range: 119.5 kg Body mass index is 46.67 kg/m.   Vital Signs:   Temp:  [97.6 F (36.4 C)-98 F (36.7 C)] 98 F (36.7 C) (11/25 0801) Pulse Rate:  [87-124] 107 (11/25 1000) Resp:  [14-26] 23 (11/25 1000) BP: (94-127)/(53-105) 127/101 (11/25 1000) SpO2:  [76 %-99 %] 97 % (11/25 1000) Weight:  [119.5 kg] 119.5 kg (11/25 0500) Last BM Date: 10/20/19  Weight change: Filed Weights   10/18/19 0500 10/19/19 0500 10/20/19 0500  Weight: 117.6 kg 116.1 kg 119.5 kg    Intake/Output:   Intake/Output Summary (Last 24 hours) at 10/20/2019 1024 Last data filed at 10/20/2019 1000 Gross per 24 hour  Intake 1967.46 ml  Output 4487 ml  Net -2519.54 ml      Physical Exam    General: NAD Neck: JVP 16 cm, no thyromegaly or thyroid nodule.  Lungs: Decreased at bases.  CV: Lateral PMI.  Heart irregular S1/S2, no S3/S4, 2/6 HSM LLSB.  1+ edema to knees.   Abdomen: Soft, nontender, no hepatosplenomegaly, no distention.  Skin: Intact without lesions or rashes.  Neurologic: Alert and oriented x 3.  Psych: Normal affect. Extremities: No clubbing or cyanosis.  HEENT: Normal.    Telemetry   Atrial fibrillation rate 100s (personally reviewed).   Labs    CBC Recent Labs    10/18/19 0439 10/19/19 0517 10/20/19 0457  WBC 7.0 6.0 6.4  NEUTROABS 5.0  --   --   HGB 8.4* 8.7* 8.2*  HCT 26.8* 28.4* 27.0*  MCV 74.9* 77.6* 78.0*  PLT 227 227 671   Basic Metabolic Panel Recent Labs    10/19/19 0517 10/19/19 1610 10/20/19 0456 10/20/19 0457  NA 131*  132* 131* 131*  K 4.1 4.2 4.2 4.2  CL 92* 97* 94* 94*  CO2 26 26 27 27   GLUCOSE 114* 112* 102* 103*  BUN 35* 24* 17 17  CREATININE 2.56* 1.93* 1.75* 1.77*  CALCIUM 9.0 8.4* 8.6* 8.7*  MG 2.6*  --   --  2.6*  PHOS 3.5 2.4* 2.6  --    Liver Function Tests Recent Labs    10/19/19 1610 10/20/19 0456  ALBUMIN 3.0* 3.1*   No results for input(s): LIPASE, AMYLASE in the last 72 hours. Cardiac Enzymes No results for input(s): CKTOTAL, CKMB, CKMBINDEX, TROPONINI in the last 72 hours.  BNP: BNP (last 3 results) Recent Labs    10/11/19 1309  BNP 3,374.7*    ProBNP (last 3 results) No results for input(s): PROBNP in the last 8760 hours.   D-Dimer Recent Labs    10/18/19 1317  DDIMER 0.83*   Hemoglobin A1C No results for input(s): HGBA1C in the last 72 hours. Fasting Lipid Panel No results for input(s): CHOL, HDL, LDLCALC, TRIG, CHOLHDL, LDLDIRECT in the last 72 hours. Thyroid Function Tests No results for input(s): TSH, T4TOTAL, T3FREE, THYROIDAB in the last 72 hours.  Invalid input(s): FREET3  Other results:   Imaging    No results found.   Medications:  Scheduled Medications:  allopurinol  300 mg Oral Daily   atorvastatin  40 mg Oral Daily   Chlorhexidine Gluconate Cloth  6 each Topical Daily   insulin aspart  0-5 Units Subcutaneous QHS   insulin aspart  0-9 Units Subcutaneous TID WC   mouth rinse  15 mL Mouth Rinse BID   multivitamin with minerals  1 tablet Oral Daily   vitamin C  500 mg Oral Daily   zinc sulfate  220 mg Oral Daily    Infusions:   prismasol BGK 4/2.5 400 mL/hr at 10/19/19 2311    prismasol BGK 4/2.5 300 mL/hr at 10/20/19 0326   sodium chloride Stopped (10/19/19 1826)   amiodarone 60 mg/hr (10/20/19 1000)   heparin 900 Units/hr (10/20/19 1000)   milrinone 0.25 mcg/kg/min (10/20/19 1000)   prismasol BGK 4/2.5 1,500 mL/hr at 10/20/19 0757    PRN Medications: sodium chloride, acetaminophen, cyclobenzaprine,  heparin, ondansetron (ZOFRAN) IV, polyethylene glycol, sodium chloride flush, traMADol, white petrolatum   Assessment/Plan   1. Acute on chronic systolic CHF:  Nonischemic cardiomyopathy, cath in [place] in 9/20 with no significant coronary disease.  Echo with EF 20-25% with dilated and severely dysfunctional RV with D-shaped interventricular septum.  MR appears moderate and TR appears moderate.  With dilated RV, V/Q scan was done and was negative.  Biventricular failure.  Last echo in [place] per report showed EF 40-45%.  Cause of fall in EF uncertain, could be due to atrial fibrillation with RVR (tachycardia-mediated).  Apparently chronic AF but last clinic note from outpatient cardiologist outlines plan for possible atrial fibrillation ablation.  Myocarditis, potentially from COVID-19, is possible.  It is also possible that she has a stress cardiomyopathy due to medical illness/COVID-19 infection.  I have been concerned for low output HF with rise in creatinine and difficulty with diuresis.  Started milrinone at 0.25, HR generally in 100s currently.  Co-ox has been good on milrinone, 77% today.  With ongoing poor diuresis, CVVH started 11/23 and Lasix gtt stopped.  UF 100 cc/hr with CVP 25 currently.  - Aim for net UF 100-150 cc/hr today, gently increase.  Appear to have a ways to go with fluid removal, BP tolerating.  - Continue milrinone 0.25 mcg/kg/min.  2. Valvular heart disease: Patient was supposed to have mitral and tricuspid valve replacements this week.  TEE in 9/20 in [place] showed mod-severe MR and severe TR.  On my read of the echo done here, she has moderate MR and moderate TR (both central and likely functional).  Her main issue here seems to be biventricular failure and would hold off on valve procedures for the time being.  3. Atrial fibrillation: Chronic per outpatient notes. Per daughter, she has had cardioversions in the past. There apparently has been consideration for atrial  fibrillation ablation. She remains in RVR but rate better-controlled today (100s this morning).  - Continue amiodarone 60 mg/hr.  - Continue heparin gtt for now.  - Eventually I think she will need TEE-DCCV.  Would try to get her off milrinone first and will need to be more diuresed to have best chance to keep her in NSR.  Procedure will be complicated by EQAST-41+.   4. COVID-19 infection: No PNA on CXR.  On supplemental O2 but this may be due to CHF. Seems to be doing ok from this standpoint. Finished remdesivir.  5. AKI: Per report, baseline creatinine around 1.2, increased up to 3.47.  Suspect primarily cardiorenal. She is volume overloaded and did not  diurese adequately, so CVVH started.  - As above, continue CVVH aiming for 100-150 cc net UF.  Hopefully renal function will recover once volume is removed.  6. Hyponatremia: Hypervolemic hypernatremia.  She got tolvaptan on 11/22.  Improved with CVVH.  CRITICAL CARE Performed by: [name]  Total critical care time: 35 minutes  Critical care time was exclusive of separately billable procedures and treating other patients.  Critical care was necessary to treat or prevent imminent or life-threatening deterioration.  Critical care was time spent personally by me on the following activities: development of treatment plan with patient and/or surrogate as well as nursing, discussions with consultants, evaluation of patient's response to treatment, examination of patient, obtaining history from patient or surrogate, ordering and performing treatments and interventions, ordering and review of laboratory studies, ordering and review of radiographic studies, pulse oximetry and re-evaluation of patient's condition.   Length of Stay: 8  [name], MD  10/20/2019, 10:24 AM  Advanced Heart Failure Team Pager [phone] (M-F; 7a - 4p)  Please contact [facility] for night-coverage after hours (4p -7a ) and weekends on [email or web address]

## 2019-10-21 DIAGNOSIS — I5043 Acute on chronic combined systolic (congestive) and diastolic (congestive) heart failure: Secondary | ICD-10-CM | POA: Diagnosis not present

## 2019-10-21 DIAGNOSIS — U071 COVID-19: Secondary | ICD-10-CM | POA: Diagnosis not present

## 2019-10-21 DIAGNOSIS — N179 Acute kidney failure, unspecified: Secondary | ICD-10-CM | POA: Diagnosis not present

## 2019-10-21 DIAGNOSIS — I5023 Acute on chronic systolic (congestive) heart failure: Secondary | ICD-10-CM

## 2019-10-21 DIAGNOSIS — I5041 Acute combined systolic (congestive) and diastolic (congestive) heart failure: Secondary | ICD-10-CM | POA: Diagnosis not present

## 2019-10-21 LAB — CBC
HCT: 28.1 % — ABNORMAL LOW (ref 36.0–46.0)
Hemoglobin: 8.4 g/dL — ABNORMAL LOW (ref 12.0–15.0)
MCH: 24.1 pg — ABNORMAL LOW (ref 26.0–34.0)
MCHC: 29.9 g/dL — ABNORMAL LOW (ref 30.0–36.0)
MCV: 80.5 fL (ref 80.0–100.0)
Platelets: 191 10*3/uL (ref 150–400)
RBC: 3.49 MIL/uL — ABNORMAL LOW (ref 3.87–5.11)
RDW: 19.8 % — ABNORMAL HIGH (ref 11.5–15.5)
WBC: 5.9 10*3/uL (ref 4.0–10.5)
nRBC: 0.3 % — ABNORMAL HIGH (ref 0.0–0.2)

## 2019-10-21 LAB — GLUCOSE, CAPILLARY
Glucose-Capillary: 99 mg/dL (ref 70–99)
Glucose-Capillary: 98 mg/dL (ref 70–99)
Glucose-Capillary: 121 mg/dL — ABNORMAL HIGH (ref 70–99)
Glucose-Capillary: 95 mg/dL (ref 70–99)

## 2019-10-21 LAB — RENAL FUNCTION PANEL
Albumin: 3 g/dL — ABNORMAL LOW (ref 3.5–5.0)
Albumin: 2.9 g/dL — ABNORMAL LOW (ref 3.5–5.0)
Anion gap: 10 (ref 5–15)
Anion gap: 8 (ref 5–15)
BUN: 7 mg/dL — ABNORMAL LOW (ref 8–23)
BUN: 8 mg/dL (ref 8–23)
CO2: 28 mmol/L (ref 22–32)
CO2: 27 mmol/L (ref 22–32)
Calcium: 8.7 mg/dL — ABNORMAL LOW (ref 8.9–10.3)
Calcium: 8.5 mg/dL — ABNORMAL LOW (ref 8.9–10.3)
Chloride: 95 mmol/L — ABNORMAL LOW (ref 98–111)
Chloride: 98 mmol/L (ref 98–111)
Creatinine, Ser: 1.49 mg/dL — ABNORMAL HIGH (ref 0.44–1.00)
Creatinine, Ser: 1.54 mg/dL — ABNORMAL HIGH (ref 0.44–1.00)
GFR calc Af Amer: 40 mL/min — ABNORMAL LOW (ref >60)
GFR calc Af Amer: 39 mL/min — ABNORMAL LOW (ref >60)
GFR calc non Af Amer: 35 mL/min — ABNORMAL LOW (ref >60)
GFR calc non Af Amer: 33 mL/min — ABNORMAL LOW (ref >60)
Glucose, Bld: 179 mg/dL — ABNORMAL HIGH (ref 70–99)
Glucose, Bld: 116 mg/dL — ABNORMAL HIGH (ref 70–99)
Phosphorus: 2.5 mg/dL (ref 2.5–4.6)
Phosphorus: 2.3 mg/dL — ABNORMAL LOW (ref 2.5–4.6)
Potassium: 4.1 mmol/L (ref 3.5–5.1)
Potassium: 4.2 mmol/L (ref 3.5–5.1)
Sodium: 133 mmol/L — ABNORMAL LOW (ref 135–145)
Sodium: 133 mmol/L — ABNORMAL LOW (ref 135–145)

## 2019-10-21 LAB — MAGNESIUM: Magnesium: 2.6 mg/dL — ABNORMAL HIGH (ref 1.7–2.4)

## 2019-10-21 LAB — HEPARIN LEVEL (UNFRACTIONATED)
Heparin Unfractionated: 1.08 IU/mL — ABNORMAL HIGH (ref 0.30–0.70)
Heparin Unfractionated: 0.7 IU/mL (ref 0.30–0.70)
Heparin Unfractionated: 0.46 IU/mL (ref 0.30–0.70)

## 2019-10-21 MED ORDER — SODIUM PHOSPHATES 45 MMOLE/15ML IV SOLN
10.0000 mmol | Freq: Once | INTRAVENOUS | Status: AC
  Administered 2019-10-21: 10 mmol via INTRAVENOUS
  Filled 2019-10-21: qty 3.33

## 2019-10-21 NOTE — Progress Notes (Addendum)
[facility] Progress Note    Assessment/ Plan:   1.  AKI on CKD 3: baseline Cr reportedly 1.2.  Likely d/t decompensated CHF.  UA with 6-10 RBCs and no WBCs, UP/C 0.2, not really reflective of GN or COVID AKI. Renal US neg.   Cr uptrending in setting of worsening hemodynamics, Afib, and low BP--> cardiogenic shock picture.  Didn't really diurese well despite milrinone and Lasix gtt.  CRRT on 11/23 after nontunneled catheter with critical care - Continue CRRT, 100 cc/hr net neg as tolerated 4K fluids.  Hope to transition to [facility] soon.  Gentle phos  2.  Severe biventricular CHF: was due for MV and TV replacement in [place] on 11/18.  TTE performed--> Biventricular failure with EF 20-25%.  Advanced HF following, appreciate.  milrinone  3.  COVID + : therapies per primary team   4.  Afib; was on metop and eliquis as OP, now on amio gtt and hep gtt.  5.  Hyponatremia: improving; on CRRT   6. Anemia - iron deficiency and AKI - no acute indication for PRBC's.  aranesp 40 mcg on 11/24 once   7.  Dispo: in ICU   Subjective:    She has continued on CRRT and has tolerated 100 net negative an hour.  Seen on CRRT and procedure supervised.  RIJ nontunneled catheter. She has been anuric and had 4.3 liters off over 11/25 as charted.  She has been on room air.  On amio, milrinone and heparin still.  Spoke with nursing  Review of systems: per nursing  states shortness of breath is gone Denies chest pain  Denies n/v     Not eating much    Objective:   BP 96/69   Pulse 91   Temp 98.1 F (36.7 C) (Oral)   Resp 13   Ht 5\' 3"  (1.6 m)   Wt 115.1 kg   SpO2 98%   BMI 44.95 kg/m   Intake/Output Summary (Last 24 hours) at 10/21/2019 0640 Last data filed at 10/21/2019 0600 Gross per 24 hour  Intake 1828.19 ml  Output 4443 ml  Net -2614.81 ml   Weight change: -4.4 kg  Physical Exam: last examined in person on 11/25 with appropriate PPE.  On 11/26 discussed with nursing and  examined via window.  Due to the nature of this patient's suspected COVID-19 with isolation and in keeping with efforts to prevent the spread of infection and to conserve personal protective equipment, a physical exam was not personally performed.  Patient's symptoms and exam were discussed in detail with the RN.  A chart review of other providers notes and the patient's lab work as well as review of other pertinent studies was performed.  Exam details from prior documentation were reviewed specifically and confirmed with the bedside nurse.  Location of service: [place] adult female in bed in no acute distress HEENT normocephalic atraumatic Neck supple trachea midline Lungs clear but diminshed on exam per nursing Heart tachy and irregular/afib - on amio Abdomen soft nontender nondistended per nursing Extremities unna boots on feet with around 3+ edema lower extremities per nursing  Psych normal mood and affect per nursing Neuro - alert and oriented x 3 per nursing Access RIJ nontunneled catheter   Imaging: No results found.  Labs: BMET Recent Labs  Lab 10/14/19 1431  10/18/19 1613 10/19/19 0517 10/19/19 1610 10/20/19 0456 10/20/19 0457 10/20/19 1640 10/21/19 0412  NA 135   < > 124* 131*  132* 131* 131* 130* 133*  K 3.7   < > 4.2 4.1 4.2 4.2 4.2 4.3 4.1  CL 92*   < > 82* 92* 97* 94* 94* 96* 95*  CO2 28   < > 25 26 26 27 27 26 28   GLUCOSE 122*   < > 103* 114* 112* 102* 103* 111* 179*  BUN 52*   < > 59* 35* 24* 17 17 12  7*  CREATININE 2.35*   < > 3.57* 2.56* 1.93* 1.75* 1.77* 1.56* 1.49*  CALCIUM 9.2   < > 9.0 9.0 8.4* 8.6* 8.7* 8.7* 8.7*  PHOS 3.7  --  5.2* 3.5 2.4* 2.6  --  1.9* 2.5   < > = values in this interval not displayed.   CBC Recent Labs  Lab 10/18/19 0439 10/19/19 0517 10/20/19 0457 10/21/19 0412  WBC 7.0 6.0 6.4 5.9  NEUTROABS 5.0  --   --   --   HGB 8.4* 8.7* 8.2* 8.4*  HCT 26.8* 28.4* 27.0* 28.1*  MCV 74.9* 77.6* 78.0* 80.5  PLT 227  227 215 191    Medications:    . allopurinol  300 mg Oral Daily  . atorvastatin  40 mg Oral Daily  . Chlorhexidine Gluconate Cloth  6 each Topical Daily  . insulin aspart  0-5 Units Subcutaneous QHS  . insulin aspart  0-9 Units Subcutaneous TID WC  . mouth rinse  15 mL Mouth Rinse BID  . multivitamin with minerals  1 tablet Oral Daily  . vitamin C  500 mg Oral Daily  . zinc sulfate  220 mg Oral Daily    Claudia Desanctis  10/21/2019, 6:40 AM

## 2019-10-21 NOTE — Progress Notes (Signed)
ANTICOAGULATION CONSULT NOTE  Pharmacy Consult for Heparin Indication: atrial fibrillation  Patient Measurements: Height: 5\' 3"  (160 cm) Weight: 253 lb 12 oz (115.1 kg) IBW/kg (Calculated) : 52.4 Heparin Dosing Weight: 79.8 kg  Vital Signs: Temp: 98.1 F (36.7 C) (11/26 0400) Temp Source: Oral (11/26 0400) BP: 96/69 (11/26 0600) Pulse Rate: 91 (11/26 0600)  Labs: Recent Labs    10/19/19 0517  10/20/19 0457 10/20/19 1640 10/21/19 0412 10/21/19 0456  HGB 8.7*  --  8.2*  --  8.4*  --   HCT 28.4*  --  27.0*  --  28.1*  --   PLT 227  --  215  --  191  --   HEPARINUNFRC 0.47  --  0.57  --  1.08* 0.70  CREATININE 2.56*   < > 1.77* 1.56* 1.49*  --    < > = values in this interval not displayed.   Assessment: 73 yr old female presenting with SOB and weight gain - on apixaban PTA, on hold for now. Last dose of Eliquis was on 11/8.   Pt was told by outside hospital that she required mitral/tricupsid valve replacement > this surgery was deferred upon transfer to [facility]. Incidentally found to be COVID + s/p treatment  LE dopplers and V/Q scan neg for PE/DVT.   Hep lvl early this am was 1.08, but drawn from line with heparin infusing. RN flushed and redrew and was 0.7  Goal of Therapy:  Heparin level 0.3-0.7 units/ml  Monitor platelets by anticoagulation protocol: Yes  Plan:  Reduce heparin to 850 units/hr Recheck lvl at 1630 Daily hep lvl cbc  Barth Kirks, PharmD, BCPS, BCCCP Clinical Pharmacist [phone]  Please check AMION for all [place] numbers  10/21/2019 7:46 AM

## 2019-10-21 NOTE — Progress Notes (Signed)
ANTICOAGULATION CONSULT NOTE  Pharmacy Consult for Heparin Indication: atrial fibrillation  Patient Measurements: Height: 5\' 3"  (160 cm) Weight: 253 lb 12 oz (115.1 kg) IBW/kg (Calculated) : 52.4 Heparin Dosing Weight: 79.8 kg  Vital Signs: Temp: 97.3 F (36.3 C) (11/26 1600) Temp Source: Oral (11/26 1600) BP: 129/100 (11/26 1400) Pulse Rate: 107 (11/26 1400)  Labs: Recent Labs    10/19/19 0517  10/20/19 0457 10/20/19 1640 10/21/19 0412 10/21/19 0456 10/21/19 1600  HGB 8.7*  --  8.2*  --  8.4*  --   --   HCT 28.4*  --  27.0*  --  28.1*  --   --   PLT 227  --  215  --  191  --   --   HEPARINUNFRC 0.47  --  0.57  --  1.08* 0.70 0.46  CREATININE 2.56*   < > 1.77* 1.56* 1.49*  --   --    < > = values in this interval not displayed.   Assessment: 73 yr old female presenting with SOB and weight gain - on apixaban PTA, on hold for now. Last dose of Eliquis was on 11/8.   Pt was told by outside hospital that she required mitral/tricupsid valve replacement > this surgery was deferred upon transfer to [facility]. Incidentally found to be COVID + s/p treatment.  LE dopplers and V/Q scan neg for PE/DVT.   Heparin level now therapeutic at 0.46 on 850 units/hour. H&H low stable, no issues with infusion and bleeding reported per RN.  Goal of Therapy:  Heparin level 0.3-0.7 units/ml  Monitor platelets by anticoagulation protocol: Yes  Plan:  Continue heparin IV 850 units/hr Daily heparin levels, CBC Monitor for s/sx's of bleed     Thank you for allowing pharmacy to participate in this patient's care.  Lalita Ebel L. Devin Going, [place] PGY1 Pharmacy Resident [phone] 10/21/19      4:50 PM  Please check AMION for all [place] phone numbers After 10:00 PM, call the [place] [phone]

## 2019-10-21 NOTE — Progress Notes (Signed)
NAME:  Melissa Adams, MRN:  [id number], DOB:  Oct 03, 1946, LOS: 9 ADMISSION DATE:  10/11/2019, CONSULTATION DATE:  11/23 REFERRING MD: Royce Macadamia  , CHIEF COMPLAINT:  Worsening renal failure in setting of biventricular heart failure    Brief History   73 year old female admitted w/ acute biventricular heart failure. EF decreased from 40-45% down to 20-25% (unclear if this is rate related or due to myocarditis from [place]) Incidentally COVID-19 positive.   PCCM asked to see 11/23 for progressive cardio-renal syndrome and need for HD on 11/23  History of present illness   73 year old female w/ hx as mentioned below. Admitted 11/16 w/ cc: weight gain (22 lbs) and worsening shortness of breath. BNP elevated. Admitted w/ working dx of decompensated HF and hyponatremia.  ->incidentally fund to be COVID-19 positive.  Started on Remdesivir (steroids stopped as felt mostly HF) and lasix 11/18; Started on milrinone w/ lasix and PICC placed for CVP. 11/20 to ICU for close obs 11/21 got tolvaptan per cards Na 128 felt d/t volume overload 11/22 lasix gtt. Repeated Tolvaptan  11/23 renal fxn worse, still volume overloaded. 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 11/20 to ICU for close obs 11/21 got tolvaptan per cards Na 128 felt d/t volume overload 11/22 lasix gtt. Repeated Tolvaptan  11/23 renal fxn worse, still volume overloaded. PCCM asked to see for HD cath and assist w/ RX 11/24- present, pulling fluid  Consults:  11/16 cards 11/17 renal 11/18 heart failure  11/23 critical care   Procedures:  HD cath 11/23 >   Significant Diagnostic Tests:  11/17 echo:  EF 20-25%, dilated LV, enlarged  RV/LA/RA.  Micro Data:  11/16 and 11/17 COVID: positive   Antimicrobials:  Remdesivir 11/17-->11/22  Interim history/subjective:  Somnolent this AM.  Denies cough, pain, or dyspnea, tolerating fluid removal by CRRT.  Objective   Blood pressure 96/69, pulse 91, temperature 98.1 F (36.7 C), temperature source Oral, resp. rate 13, height 5\' 3"  (1.6 m), weight 115.1 kg, SpO2 98 %. CVP:  [15 mmHg-27 mmHg] 26 mmHg      Intake/Output Summary (Last 24 hours) at 10/21/2019 0263 Last data filed at 10/21/2019 0600 Gross per 24 hour  Intake 1828.19 ml  Output 4443 ml  Net -2614.81 ml   Filed Weights   10/19/19 0500 10/20/19 0500 10/21/19 0500  Weight: 116.1 kg 119.5 kg 115.1 kg    Examination: GEN: elderly woman in NAD HEENT: MMM, neck veins distended CV: Irregular, ext warm PULM: Clear, no accessory muscle use GI: Soft, +BS EXT: Global anasarca, no clubbing NEURO: Moves all 4 ext to command  PSYCH: RASS -1 SKIN: No rashes  11/23 CXR fluid overload BMET and electrolytes look okay CBC stable  Assessment & Plan:   # Acute on chronic biventricular heart failure w/ EF down to 20-25% (baseline was 40-45%); c/b known mod TR and  MR.- differential includes myocarditis, arrhythmia induced on top of valvular disease. # Afib- persistent despite amiodarone, rates okay # COVID 19 infection- no obvious lung involvement although could be hiding in pulmonary edema pattern on CXR.   S/p remdesivir and steroids. # Cardiorenal  syndrome- on CRRT  - Milrinone, amiodarone per CHF team - Heparin gtt - Continue to push fluid removal - Keep day/night cycles to reduce delirium risk - Airborne isolation - Give and encourage IS - DCCV and valve repair sometime down the line - Appreciate CHF/nephro help  Best practice:  Diet:HF diet  Pain/Anxiety/Delirium protocol (if indicated): NA VAP protocol (if indicated): na DVT prophylaxis: iv HEPARIN  GI prophylaxis: NA Glucose control: ssi  Mobility: w/ assist Code Status: full code  Disposition: ICU  CC time: 32 min.   Erskine Emery MD PCCM

## 2019-10-21 NOTE — Progress Notes (Signed)
Patient ID: Melissa Adams, female   DOB: November 28, 1945, 73 y.o.   MRN: [id number]     Advanced Heart Failure Rounding Note  PCP-Cardiologist: No primary care provider on file.   Subjective:    CVVH started 11/23, UF 100 cc/hr, BP tolerating.  She remains on amiodarone gtt 60 mg/hr, milrinone 0.25 mcg/kg/min.  No co-ox today.  CVP remains > 20.  Net negative -2612 yesterday.   Not short of breath at rest, +orthopnea.   Objective:   Weight Range: 115.1 kg Body mass index is 44.95 kg/m.   Vital Signs:   Temp:  [97.2 F (36.2 C)-98.1 F (36.7 C)] 98.1 F (36.7 C) (11/26 0400) Pulse Rate:  [91-120] 91 (11/26 0600) Resp:  [13-23] 13 (11/26 0600) BP: (96-183)/(54-167) 96/69 (11/26 0600) SpO2:  [91 %-100 %] 98 % (11/26 0600) Weight:  [115.1 kg] 115.1 kg (11/26 0500) Last BM Date: 10/20/19  Weight change: Filed Weights   10/19/19 0500 10/20/19 0500 10/21/19 0500  Weight: 116.1 kg 119.5 kg 115.1 kg    Intake/Output:   Intake/Output Summary (Last 24 hours) at 10/21/2019 0735 Last data filed at 10/21/2019 0700 Gross per 24 hour  Intake 1828.18 ml  Output 4440 ml  Net -2611.82 ml      Physical Exam    General: NAD Neck: JVP 16+, no thyromegaly or thyroid nodule.  Lungs: Clear to auscultation bilaterally with normal respiratory effort. CV: Nondisplaced PMI.  Heart irregular S1/S2, no S3/S4, no murmur.  1+ edema to knees.   Abdomen: Soft, nontender, no hepatosplenomegaly, no distention.  Skin: Intact without lesions or rashes.  Neurologic: Alert and oriented x 3.  Psych: Normal affect. Extremities: No clubbing or cyanosis.  HEENT: Normal.    Telemetry   Atrial fibrillation rate 100s (personally reviewed).   Labs    CBC Recent Labs    10/20/19 0457 10/21/19 0412  WBC 6.4 5.9  HGB 8.2* 8.4*  HCT 27.0* 28.1*  MCV 78.0* 80.5  PLT 215 888   Basic Metabolic Panel Recent Labs    10/20/19 0457 10/20/19 1640 10/21/19 0412  NA 131* 130* 133*  K 4.2 4.3 4.1   CL 94* 96* 95*  CO2 27 26 28   GLUCOSE 103* 111* 179*  BUN 17 12 7*  CREATININE 1.77* 1.56* 1.49*  CALCIUM 8.7* 8.7* 8.7*  MG 2.6*  --  2.6*  PHOS  --  1.9* 2.5   Liver Function Tests Recent Labs    10/20/19 1640 10/21/19 0412  ALBUMIN 3.0* 3.0*   No results for input(s): LIPASE, AMYLASE in the last 72 hours. Cardiac Enzymes No results for input(s): CKTOTAL, CKMB, CKMBINDEX, TROPONINI in the last 72 hours.  BNP: BNP (last 3 results) Recent Labs    10/11/19 1309  BNP 3,374.7*    ProBNP (last 3 results) No results for input(s): PROBNP in the last 8760 hours.   D-Dimer Recent Labs    10/18/19 1317  DDIMER 0.83*   Hemoglobin A1C No results for input(s): HGBA1C in the last 72 hours. Fasting Lipid Panel No results for input(s): CHOL, HDL, LDLCALC, TRIG, CHOLHDL, LDLDIRECT in the last 72 hours. Thyroid Function Tests No results for input(s): TSH, T4TOTAL, T3FREE, THYROIDAB in the last 72 hours.  Invalid input(s): FREET3  Other results:   Imaging    No results found.   Medications:     Scheduled Medications: . allopurinol  300 mg Oral Daily  . atorvastatin  40 mg Oral Daily  . Chlorhexidine Gluconate Cloth  6 each  Topical Daily  . insulin aspart  0-5 Units Subcutaneous QHS  . insulin aspart  0-9 Units Subcutaneous TID WC  . mouth rinse  15 mL Mouth Rinse BID  . multivitamin with minerals  1 tablet Oral Daily  . vitamin C  500 mg Oral Daily  . zinc sulfate  220 mg Oral Daily    Infusions: .  prismasol BGK 4/2.5 400 mL/hr at 10/21/19 0409  .  prismasol BGK 4/2.5 300 mL/hr at 10/20/19 1516  . sodium chloride Stopped (10/19/19 1826)  . amiodarone 60 mg/hr (10/21/19 0700)  . heparin 900 Units/hr (10/21/19 0700)  . milrinone 0.25 mcg/kg/min (10/21/19 0700)  . prismasol BGK 4/2.5 1,500 mL/hr at 10/21/19 0616  . sodium phosphate  Dextrose 5% IVPB      PRN Medications: sodium chloride, acetaminophen, cyclobenzaprine, heparin, ondansetron (ZOFRAN) IV,  polyethylene glycol, sodium chloride flush, traMADol, white petrolatum   Assessment/Plan   1. Acute on chronic systolic CHF:  Nonischemic cardiomyopathy, cath in [place] in 9/20 with no significant coronary disease.  Echo with EF 20-25% with dilated and severely dysfunctional RV with D-shaped interventricular septum.  MR appears moderate and TR appears moderate.  With dilated RV, V/Q scan was done and was negative.  Biventricular failure.  Last echo in [place] per report showed EF 40-45%.  Cause of fall in EF uncertain, could be due to atrial fibrillation with RVR (tachycardia-mediated).  Apparently chronic AF but last clinic note from outpatient cardiologist outlines plan for possible atrial fibrillation ablation.  Myocarditis, potentially from COVID-19, is possible.  It is also possible that she has a stress cardiomyopathy due to medical illness/COVID-19 infection.  I have been concerned for low output HF with rise in creatinine and difficulty with diuresis.  Started milrinone at 0.25, HR generally in 100s currently.  Co-ox has been good on milrinone, not done today.  With ongoing poor diuresis, CVVH started 11/23 and Lasix gtt stopped.  UF 100 cc/hr with CVP still >20.  - Aim for net UF 100-150 cc/hr today, gently increase.  Appear to have a ways to go with fluid removal, BP tolerating.  Weight is trending down.  - Continue milrinone 0.25 mcg/kg/min.  2. Valvular heart disease: Patient was supposed to have mitral and tricuspid valve replacements this week.  TEE in 9/20 in [place] showed mod-severe MR and severe TR.  On my read of the echo done here, she has moderate MR and moderate TR (both central and likely functional).  Her main issue here seems to be biventricular failure and would hold off on valve procedures for the time being.  3. Atrial fibrillation: Chronic per outpatient notes. Per daughter, she has had cardioversions in the past. There apparently has been consideration for atrial fibrillation  ablation. She remains in RVR but rate better-controlled today (100s this morning).  - Try to decrease amiodarone to 30 mg/hr today.  - Continue heparin gtt for now.  - Eventually I think she will need TEE-DCCV.  Would try to get her off milrinone first and will need to be more diuresed to have best chance to keep her in NSR.  Procedure will be complicated by [id number]+.   4. COVID-19 infection: No PNA on CXR.  On supplemental O2 but this may be due to CHF. Seems to be doing ok from this standpoint. Finished remdesivir.  5. AKI: Per report, baseline creatinine around 1.2, increased up to 3.47.  Suspect primarily cardiorenal. She is volume overloaded and did not diurese adequately, so CVVH started.  She is not making much urine.  - As above, continue CVVH aiming for 100-150 cc net UF.  Hopefully renal function will recover once volume is removed but concerning that she has had minimal UOP.  6. Hyponatremia: Hypervolemic hypernatremia.  She got tolvaptan on 11/22.  Improved with CVVH.  Length of Stay: 9  [name], MD  10/21/2019, 7:35 AM  Advanced Heart Failure Team Pager [phone] (M-F; 7a - 4p)  Please contact [facility] for night-coverage after hours (4p -7a ) and weekends on [email or web address]

## 2019-10-21 NOTE — Plan of Care (Signed)
  Problem: Clinical Measurements: Goal: Will remain free from infection Outcome: Progressing Goal: Diagnostic test results will improve Outcome: Progressing Goal: Respiratory complications will improve Outcome: Progressing Goal: Cardiovascular complication will be avoided Outcome: Progressing   Problem: Pain Managment: Goal: General experience of comfort will improve Outcome: Progressing   Problem: Safety: Goal: Ability to remain free from injury will improve Outcome: Progressing   Problem: Skin Integrity: Goal: Risk for impaired skin integrity will decrease Outcome: Progressing   Problem: Education: Goal: Knowledge of risk factors and measures for prevention of condition will improve Outcome: Progressing   Problem: Respiratory: Goal: Will maintain a patent airway Outcome: Progressing   Problem: Nutrition: Goal: Adequate nutrition will be maintained Outcome: Not Progressing

## 2019-10-21 NOTE — Progress Notes (Signed)
Assisted tele visit to patient with daughter.  Melissa Fitzhenry Anderson, RN   

## 2019-10-22 DIAGNOSIS — N179 Acute kidney failure, unspecified: Secondary | ICD-10-CM | POA: Diagnosis not present

## 2019-10-22 DIAGNOSIS — L8990 Pressure ulcer of unspecified site, unspecified stage: Secondary | ICD-10-CM | POA: Insufficient documentation

## 2019-10-22 DIAGNOSIS — I5041 Acute combined systolic (congestive) and diastolic (congestive) heart failure: Secondary | ICD-10-CM | POA: Diagnosis not present

## 2019-10-22 DIAGNOSIS — I482 Chronic atrial fibrillation, unspecified: Secondary | ICD-10-CM | POA: Diagnosis not present

## 2019-10-22 DIAGNOSIS — I509 Heart failure, unspecified: Secondary | ICD-10-CM | POA: Diagnosis not present

## 2019-10-22 LAB — COOXEMETRY PANEL
Carboxyhemoglobin: 1.7 % — ABNORMAL HIGH (ref 0.5–1.5)
Methemoglobin: 0.7 % (ref 0.0–1.5)
O2 Saturation: 75 %
Total hemoglobin: 8.3 g/dL — ABNORMAL LOW (ref 12.0–16.0)

## 2019-10-22 LAB — CBC
HCT: 27 % — ABNORMAL LOW (ref 36.0–46.0)
Hemoglobin: 8.2 g/dL — ABNORMAL LOW (ref 12.0–15.0)
MCH: 24.3 pg — ABNORMAL LOW (ref 26.0–34.0)
MCHC: 30.4 g/dL (ref 30.0–36.0)
MCV: 80.1 fL (ref 80.0–100.0)
Platelets: 183 10*3/uL (ref 150–400)
RBC: 3.37 MIL/uL — ABNORMAL LOW (ref 3.87–5.11)
RDW: 20.4 % — ABNORMAL HIGH (ref 11.5–15.5)
WBC: 5.4 10*3/uL (ref 4.0–10.5)
nRBC: 0.6 % — ABNORMAL HIGH (ref 0.0–0.2)

## 2019-10-22 LAB — RENAL FUNCTION PANEL
Albumin: 2.9 g/dL — ABNORMAL LOW (ref 3.5–5.0)
Albumin: 2.9 g/dL — ABNORMAL LOW (ref 3.5–5.0)
Anion gap: 11 (ref 5–15)
Anion gap: 8 (ref 5–15)
BUN: 5 mg/dL — ABNORMAL LOW (ref 8–23)
BUN: 5 mg/dL — ABNORMAL LOW (ref 8–23)
CO2: 26 mmol/L (ref 22–32)
CO2: 27 mmol/L (ref 22–32)
Calcium: 8.7 mg/dL — ABNORMAL LOW (ref 8.9–10.3)
Calcium: 8.5 mg/dL — ABNORMAL LOW (ref 8.9–10.3)
Chloride: 96 mmol/L — ABNORMAL LOW (ref 98–111)
Chloride: 99 mmol/L (ref 98–111)
Creatinine, Ser: 1.36 mg/dL — ABNORMAL HIGH (ref 0.44–1.00)
Creatinine, Ser: 1.58 mg/dL — ABNORMAL HIGH (ref 0.44–1.00)
GFR calc Af Amer: 45 mL/min — ABNORMAL LOW (ref >60)
GFR calc Af Amer: 37 mL/min — ABNORMAL LOW (ref >60)
GFR calc non Af Amer: 39 mL/min — ABNORMAL LOW (ref >60)
GFR calc non Af Amer: 32 mL/min — ABNORMAL LOW (ref >60)
Glucose, Bld: 162 mg/dL — ABNORMAL HIGH (ref 70–99)
Glucose, Bld: 113 mg/dL — ABNORMAL HIGH (ref 70–99)
Phosphorus: 1.6 mg/dL — ABNORMAL LOW (ref 2.5–4.6)
Phosphorus: 2.4 mg/dL — ABNORMAL LOW (ref 2.5–4.6)
Potassium: 4.3 mmol/L (ref 3.5–5.1)
Potassium: 4.3 mmol/L (ref 3.5–5.1)
Sodium: 133 mmol/L — ABNORMAL LOW (ref 135–145)
Sodium: 134 mmol/L — ABNORMAL LOW (ref 135–145)

## 2019-10-22 LAB — HEPARIN LEVEL (UNFRACTIONATED)
Heparin Unfractionated: 1.03 IU/mL — ABNORMAL HIGH (ref 0.30–0.70)
Heparin Unfractionated: 0.45 IU/mL (ref 0.30–0.70)

## 2019-10-22 LAB — GLUCOSE, CAPILLARY
Glucose-Capillary: 92 mg/dL (ref 70–99)
Glucose-Capillary: 104 mg/dL — ABNORMAL HIGH (ref 70–99)
Glucose-Capillary: 103 mg/dL — ABNORMAL HIGH (ref 70–99)
Glucose-Capillary: 140 mg/dL — ABNORMAL HIGH (ref 70–99)

## 2019-10-22 LAB — MAGNESIUM: Magnesium: 2.6 mg/dL — ABNORMAL HIGH (ref 1.7–2.4)

## 2019-10-22 MED ORDER — SODIUM PHOSPHATES 45 MMOLE/15ML IV SOLN
10.0000 mmol | Freq: Once | INTRAVENOUS | Status: AC
  Administered 2019-10-22: 10 mmol via INTRAVENOUS
  Filled 2019-10-22: qty 3.33

## 2019-10-22 MED ORDER — SODIUM PHOSPHATES 45 MMOLE/15ML IV SOLN
30.0000 mmol | Freq: Once | INTRAVENOUS | Status: AC
  Administered 2019-10-22: 30 mmol via INTRAVENOUS
  Filled 2019-10-22: qty 10

## 2019-10-22 NOTE — Progress Notes (Signed)
Patient ID: Melissa Adams, female   DOB: 1946-03-21, 73 y.o.   MRN: [id number]    Advanced Heart Failure Rounding Note  PCP-Cardiologist: No primary care provider on file.   Subjective:    CVVH started 11/23, UF 100 cc/hr, BP tolerating.  She remains on amiodarone gtt 30 mg/hr, milrinone 0.25 mcg/kg/min.  Co-ox 75%.  CVP 19-20, weight down 6 lbs.   Not short of breath at rest, +orthopnea.   Objective:   Weight Range: 111.9 kg Body mass index is 43.7 kg/m.   Vital Signs:   Temp:  [97.2 F (36.2 C)-98 F (36.7 C)] 98 F (36.7 C) (11/27 0800) Pulse Rate:  [99-117] 115 (11/27 0900) Resp:  [13-25] 17 (11/27 0900) BP: (85-158)/(49-128) 121/68 (11/27 0900) SpO2:  [90 %-100 %] 98 % (11/27 0900) Weight:  [111.9 kg] 111.9 kg (11/27 0458) Last BM Date: 10/20/19  Weight change: Filed Weights   10/20/19 0500 10/21/19 0500 10/22/19 0458  Weight: 119.5 kg 115.1 kg 111.9 kg    Intake/Output:   Intake/Output Summary (Last 24 hours) at 10/22/2019 0919 Last data filed at 10/22/2019 0900 Gross per 24 hour  Intake 1656.92 ml  Output 3962 ml  Net -2305.08 ml      Physical Exam    General: NAD Neck: JVP 16 cm, no thyromegaly or thyroid nodule.  Lungs: Clear to auscultation bilaterally with normal respiratory effort. CV: Nondisplaced PMI.  Heart mildly tachy, irregular S1/S2, no S3/S4, 2/6 HSM LLSB.  1+ edema 1/2 to knees bilaterally.  Abdomen: Soft, nontender, no hepatosplenomegaly, no distention.  Skin: Intact without lesions or rashes.  Neurologic: Alert and oriented x 3.  Psych: Normal affect. Extremities: No clubbing or cyanosis.  HEENT: Normal.    Telemetry   Atrial fibrillation rate 100s (personally reviewed).   Labs    CBC Recent Labs    10/21/19 0412 10/22/19 0447  WBC 5.9 5.4  HGB 8.4* 8.2*  HCT 28.1* 27.0*  MCV 80.5 80.1  PLT 191 449   Basic Metabolic Panel Recent Labs    10/21/19 0412 10/21/19 1600 10/22/19 0447  NA 133* 133* 133*  K 4.1 4.2  4.3  CL 95* 98 96*  CO2 28 27 26   GLUCOSE 179* 116* 162*  BUN 7* 8 5*  CREATININE 1.49* 1.54* 1.36*  CALCIUM 8.7* 8.5* 8.7*  MG 2.6*  --  2.6*  PHOS 2.5 2.3* 1.6*   Liver Function Tests Recent Labs    10/21/19 1600 10/22/19 0447  ALBUMIN 2.9* 2.9*   No results for input(s): LIPASE, AMYLASE in the last 72 hours. Cardiac Enzymes No results for input(s): CKTOTAL, CKMB, CKMBINDEX, TROPONINI in the last 72 hours.  BNP: BNP (last 3 results) Recent Labs    10/11/19 1309  BNP 3,374.7*    ProBNP (last 3 results) No results for input(s): PROBNP in the last 8760 hours.   D-Dimer No results for input(s): DDIMER in the last 72 hours. Hemoglobin A1C No results for input(s): HGBA1C in the last 72 hours. Fasting Lipid Panel No results for input(s): CHOL, HDL, LDLCALC, TRIG, CHOLHDL, LDLDIRECT in the last 72 hours. Thyroid Function Tests No results for input(s): TSH, T4TOTAL, T3FREE, THYROIDAB in the last 72 hours.  Invalid input(s): FREET3  Other results:   Imaging    No results found.   Medications:     Scheduled Medications:  allopurinol  300 mg Oral Daily   atorvastatin  40 mg Oral Daily   Chlorhexidine Gluconate Cloth  6 each Topical Daily  insulin aspart  0-5 Units Subcutaneous QHS   insulin aspart  0-9 Units Subcutaneous TID WC   mouth rinse  15 mL Mouth Rinse BID   multivitamin with minerals  1 tablet Oral Daily   vitamin C  500 mg Oral Daily   zinc sulfate  220 mg Oral Daily    Infusions:   prismasol BGK 4/2.5 400 mL/hr at 10/22/19 0300    prismasol BGK 4/2.5 300 mL/hr at 10/22/19 0119   sodium chloride Stopped (10/21/19 1422)   amiodarone 30 mg/hr (10/22/19 0900)   heparin 850 Units/hr (10/22/19 0900)   milrinone 0.25 mcg/kg/min (10/22/19 0900)   prismasol BGK 4/2.5 1,500 mL/hr at 10/22/19 0916   sodium phosphate  Dextrose 5% IVPB 43 mL/hr at 10/22/19 0900    PRN Medications: sodium chloride, acetaminophen, cyclobenzaprine,  heparin, ondansetron (ZOFRAN) IV, polyethylene glycol, sodium chloride flush, traMADol, white petrolatum   Assessment/Plan   1. Acute on chronic systolic CHF:  Nonischemic cardiomyopathy, cath in [place] in 9/20 with no significant coronary disease.  Echo with EF 20-25% with dilated and severely dysfunctional RV with D-shaped interventricular septum.  MR appears moderate and TR appears moderate.  With dilated RV, V/Q scan was done and was negative.  Biventricular failure.  Last echo in [place] per report showed EF 40-45%.  Cause of fall in EF uncertain, could be due to atrial fibrillation with RVR (tachycardia-mediated).  Apparently chronic AF but last clinic note from outpatient cardiologist outlines plan for possible atrial fibrillation ablation.  Myocarditis, potentially from COVID-19, is possible.  It is also possible that she has a stress cardiomyopathy due to medical illness/COVID-19 infection.  I have been concerned for low output HF with rise in creatinine and difficulty with diuresis.  Started milrinone at 0.25, HR generally in 100s currently.  Co-ox has been good on milrinone, 75% today.  With ongoing poor diuresis, CVVH started 11/23 and Lasix gtt stopped.  UF 100 cc/hr with CVP 19-20 (lower).  Weight down 6 lbs compared to yesterday.  - Aim for net UF up to 150 cc/hr today, think she can tolerate increased rate.  Appears to have a ways to go with fluid removal, BP tolerating.  Weight is trending down.  Not sure she will be ready for intermittent HD tomorrow, will see what CVP looks like.  - Decrease milrinone to 0.125 mcg/kg/min today.  2. Valvular heart disease: Patient was supposed to have mitral and tricuspid valve replacements this week.  TEE in 9/20 in [place] showed mod-severe MR and severe TR.  On my read of the echo done here, she has moderate MR and moderate TR (both central and likely functional).  Her main issue here seems to be biventricular failure and would hold off on valve procedures  for the time being.  3. Atrial fibrillation: Chronic per outpatient notes. Per daughter, she has had cardioversions in the past. There apparently has been consideration for atrial fibrillation ablation. She remains in RVR but rate better-controlled today (100s this morning).  - Continue amiodarone 30 mg/hr today.  - Continue heparin gtt for now.  - Eventually I think she will need TEE-DCCV.  Would try to get her off milrinone first and will need to be more diuresed to have best chance to keep her in NSR.  Procedure will be complicated by [id number]-15+.  Would aim to stop milrinone over weekend and TEE-guided DCCV Monday or Tuesday.  4. COVID-19 infection: No PNA on CXR.  On supplemental O2 but this may be due  to CHF. Seems to be doing ok from this standpoint. Finished remdesivir.  5. AKI: Per report, baseline creatinine around 1.2, increased up to 3.47.  Suspect primarily cardiorenal. She is volume overloaded and did not diurese adequately, so CVVH started. She is not making much urine.  - As above, continue CVVH aiming for up to 150 cc net UF.  Hopefully renal function will recover once volume is removed but concerning that she has had minimal UOP.  Would like to get further volume off before iHD transition (CVP still close to 20).  6. Hyponatremia: Hypervolemic hypernatremia.  She got tolvaptan on 11/22.  Improved with CVVH.  Length of Stay: Lohman, MD  10/22/2019, 9:19 AM  Advanced Heart Failure Team Pager [phone] (M-F; 7a - 4p)  Please contact [facility] for night-coverage after hours (4p -7a ) and weekends on [email or web address]

## 2019-10-22 NOTE — Progress Notes (Signed)
NAME:  Melissa Adams, MRN:  [id number], DOB:  1946/06/20, LOS: 51 ADMISSION DATE:  10/11/2019, CONSULTATION DATE:  11/23 REFERRING MD: Royce Macadamia  , CHIEF COMPLAINT:  Worsening renal failure in setting of biventricular heart failure    Brief History   73 year old female admitted w/ acute biventricular heart failure. EF decreased from 40-45% down to 20-25% (unclear if this is rate related or due to myocarditis from [place]) Incidentally COVID-19 positive.   PCCM asked to see 11/23 for progressive cardio-renal syndrome and need for HD on 11/23  History of present illness   73 year old female w/ hx as mentioned below. Admitted 11/16 w/ cc: weight gain (22 lbs) and worsening shortness of breath. BNP elevated. Admitted w/ working dx of decompensated HF and hyponatremia.  ->incidentally fund to be COVID-19 positive.  Started on Remdesivir (steroids stopped as felt mostly HF) and lasix 11/18; Started on milrinone w/ lasix and PICC placed for CVP. 11/20 to ICU for close obs 11/21 got tolvaptan per cards Na 128 felt d/t volume overload 11/22 lasix gtt. Repeated Tolvaptan  11/23 renal fxn worse, still volume overloaded. 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 11/20 to ICU for close obs 11/21 got tolvaptan per cards Na 128 felt d/t volume overload 11/22 lasix gtt. Repeated Tolvaptan  11/23 renal fxn worse, still volume overloaded. PCCM asked to see for HD cath and assist w/ RX 11/24- present, pulling fluid  Consults:  11/16 cards 11/17 renal 11/18 heart failure  11/23 critical care   Procedures:  HD cath 11/23 >   Significant Diagnostic Tests:  11/17 echo:  EF 20-25%, dilated LV, enlarged  RV/LA/RA.  Micro Data:  11/16 and 11/17 COVID: positive   Antimicrobials:  Remdesivir 11/17-->11/22  Interim history/subjective:  Awake, alert, denies pain, breathing improving as fluid is taken off.  Objective   Blood pressure (!) 85/57, pulse (!) 112, temperature 98 F (36.7 C), temperature source Oral, resp. rate 14, height 5\' 3"  (1.6 m), weight 111.9 kg, SpO2 100 %. CVP:  [9 mmHg-27 mmHg] 12 mmHg      Intake/Output Summary (Last 24 hours) at 10/22/2019 0736 Last data filed at 10/22/2019 0700 Gross per 24 hour  Intake 1648.79 ml  Output 4000 ml  Net -2351.21 ml   Filed Weights   10/20/19 0500 10/21/19 0500 10/22/19 0458  Weight: 119.5 kg 115.1 kg 111.9 kg    Examination: GEN: elderly woman in NAD HEENT: MMM, neck veins distended CV: Irregular, ext warm PULM: Clear, no accessory muscle use GI: Soft, +BS EXT: Global anasarca persists NEURO: Moves all 4 ext to command  PSYCH: RASS -1 SKIN: No rashes  11/23 CXR fluid overload BMET/CBC look good with exception of low phos  Assessment & Plan:   # Acute on chronic biventricular heart failure w/ EF down to 20-25% (baseline was 40-45%); c/b known mod TR and  MR.- differential includes myocarditis, arrhythmia induced on top of valvular disease. # Afib- persistent despite amiodarone, rates okay # COVID 19 infection- no obvious lung involvement.  S/p remdesivir and steroids. # Cardiorenal syndrome- on CRRT  - Milrinone, amiodarone per CHF team - Heparin  gtt - Continue to push fluid removal, iHD sometime in next couple days per renal note - Keep day/night cycles to reduce delirium risk - Airborne isolation - Give and encourage IS - DCCV and TEE anticipated during this admission - Appreciate CHF/nephro help, will need lots of PT/OT once off CRRT/drips  Best practice:  Diet:HF diet  Pain/Anxiety/Delirium protocol (if indicated): NA VAP protocol (if indicated): na DVT prophylaxis: iv HEPARIN  GI prophylaxis: NA  Glucose control: ssi Mobility: w/ assist Code Status: full code  Disposition: ICU   Erskine Emery MD PCCM

## 2019-10-22 NOTE — Progress Notes (Signed)
[facility] Progress Note    Assessment/ Plan:   1.  AKI on CKD 3: baseline Cr reportedly 1.2.  Likely d/t decompensated CHF/cardiorenal.  UA with 6-10 RBCs and no WBCs, UP/C 0.2, not really reflective of GN or COVID AKI. Renal US neg.   Cr uptrending in setting of worsening hemodynamics, Afib, and low BP--> cardiogenic shock picture.  Didn't really diurese well despite milrinone and Lasix gtt.  CRRT on 11/23 after nontunneled catheter with critical care - Continue CRRT, 100 cc/hr net neg as tolerated 4K fluids.  Consider stopping CRRT on 11/28.  Hope to transition to iHD soon - potentially on 11/30.  Replete phos  2.  Severe biventricular CHF: was due for MV and TV replacement in [place] on 11/18.  TTE performed--> Biventricular failure with EF 20-25%.  Advanced HF following, appreciate.  milrinone  3.  COVID + : therapies per primary team   4.  Afib; was on metop and eliquis as OP, now on amio gtt and hep gtt.  5.  Hyponatremia: improving; on CRRT   6. Anemia - iron deficiency and AKI - no acute indication for PRBC's.  S/p aranesp 40 mcg on 11/24 once   7.  Dispo: in ICU   Subjective:    She has continued on CRRT and has tolerated 100 net negative an hour.  Seen on CRRT and procedure supervised.  RIJ nontunneled catheter. She has been anuric and had 3.9 L off over 11/26 as charted thus far.  She has been on room air.  On amio, milrinone and heparin still.   Review of systems:   Denies shortness of breath  Denies n/v Not good appetite     Objective:   BP (!) 101/49   Pulse (!) 103   Temp 98 F (36.7 C) (Oral)   Resp 14   Ht 5\' 3"  (1.6 m)   Wt 111.9 kg   SpO2 100%   BMI 43.70 kg/m   Intake/Output Summary (Last 24 hours) at 10/22/2019 0604 Last data filed at 10/22/2019 0600 Gross per 24 hour  Intake 1665.86 ml  Output 3888 ml  Net -2222.14 ml   Weight change: -3.2 kg  Physical Exam: examined on 11/27 in person with appropriate PPE  General adult  female in bed in no acute distress HEENT normocephalic atraumatic Neck supple trachea midline Lungs clear but diminshed on exam  Heart tachy and irregular/afib - on amio Abdomen soft nontender nondistended  Extremities unna boots on feet with around 2+ edema lower extremities Psych normal mood and affect  Neuro - alert and oriented x 3  Access RIJ nontunneled catheter   Imaging: No results found.  Labs: BMET Recent Labs  Lab 10/19/19 0517 10/19/19 1610 10/20/19 0456 10/20/19 0457 10/20/19 1640 10/21/19 0412 10/21/19 1600 10/22/19 0447  NA 131* 132* 131* 131* 130* 133* 133* 133*  K 4.1 4.2 4.2 4.2 4.3 4.1 4.2 4.3  CL 92* 97* 94* 94* 96* 95* 98 96*  CO2 26 26 27 27 26 28 27 26   GLUCOSE 114* 112* 102* 103* 111* 179* 116* 162*  BUN 35* 24* 17 17 12  7* 8 5*  CREATININE 2.56* 1.93* 1.75* 1.77* 1.56* 1.49* 1.54* 1.36*  CALCIUM 9.0 8.4* 8.6* 8.7* 8.7* 8.7* 8.5* 8.7*  PHOS 3.5 2.4* 2.6  --  1.9* 2.5 2.3* 1.6*   CBC Recent Labs  Lab 10/18/19 0439 10/19/19 0517 10/20/19 0457 10/21/19 0412 10/22/19 0447  WBC 7.0 6.0 6.4 5.9 5.4  NEUTROABS 5.0  --   --   --   --  HGB 8.4* 8.7* 8.2* 8.4* 8.2*  HCT 26.8* 28.4* 27.0* 28.1* 27.0*  MCV 74.9* 77.6* 78.0* 80.5 80.1  PLT 227 227 215 191 183    Medications:    . allopurinol  300 mg Oral Daily  . atorvastatin  40 mg Oral Daily  . Chlorhexidine Gluconate Cloth  6 each Topical Daily  . insulin aspart  0-5 Units Subcutaneous QHS  . insulin aspart  0-9 Units Subcutaneous TID WC  . mouth rinse  15 mL Mouth Rinse BID  . multivitamin with minerals  1 tablet Oral Daily  . vitamin C  500 mg Oral Daily  . zinc sulfate  220 mg Oral Daily    Claudia Desanctis  10/22/2019, 6:04 AM

## 2019-10-22 NOTE — Progress Notes (Signed)
ANTICOAGULATION CONSULT NOTE  Pharmacy Consult for Heparin Indication: atrial fibrillation  Patient Measurements: Height: 5\' 3"  (160 cm) Weight: 246 lb 11.1 oz (111.9 kg) IBW/kg (Calculated) : 52.4 Heparin Dosing Weight: 79.8 kg  Vital Signs: Temp: 98 F (36.7 C) (11/27 0800) Temp Source: Oral (11/27 0800) BP: 85/57 (11/27 0700) Pulse Rate: 112 (11/27 0700)  Labs: Recent Labs    10/20/19 0457  10/21/19 0412  10/21/19 1600 10/22/19 0447 10/22/19 0449 10/22/19 0539  HGB 8.2*  --  8.4*  --   --  8.2*  --   --   HCT 27.0*  --  28.1*  --   --  27.0*  --   --   PLT 215  --  191  --   --  183  --   --   HEPARINUNFRC 0.57  --  1.08*   < > 0.46  --  1.03* 0.45  CREATININE 1.77*   < > 1.49*  --  1.54* 1.36*  --   --    < > = values in this interval not displayed.   Assessment: 73 yr old female presenting with SOB and weight gain - on apixaban PTA, on hold for now. Last dose of Eliquis was on 11/8.   Pt was told by outside hospital that she required mitral/tricupsid valve replacement > this surgery was deferred upon transfer to [facility]. Incidentally found to be COVID + s/p treatment  LE dopplers and V/Q scan neg for PE/DVT.   Hep lvl therapeutic this am  Cbc stable  Goal of Therapy:  Heparin level 0.3-0.7 units/ml  Monitor platelets by anticoagulation protocol: Yes  Plan:  continue heparin 850 units/hr Daily hep lvl cbc  Barth Kirks, PharmD, BCPS, BCCCP Clinical Pharmacist [phone]  Please check AMION for all [place] numbers  10/22/2019 8:21 AM

## 2019-10-23 DIAGNOSIS — I5021 Acute systolic (congestive) heart failure: Secondary | ICD-10-CM | POA: Diagnosis not present

## 2019-10-23 LAB — POCT I-STAT 7, (LYTES, BLD GAS, ICA,H+H)
Acid-Base Excess: 2 mmol/L (ref 0.0–2.0)
Bicarbonate: 26.2 mmol/L (ref 20.0–28.0)
Calcium, Ion: 1.16 mmol/L (ref 1.15–1.40)
HCT: 28 % — ABNORMAL LOW (ref 36.0–46.0)
Hemoglobin: 9.5 g/dL — ABNORMAL LOW (ref 12.0–15.0)
O2 Saturation: 91 %
Patient temperature: 98.3
Potassium: 4.3 mmol/L (ref 3.5–5.1)
Sodium: 132 mmol/L — ABNORMAL LOW (ref 135–145)
TCO2: 27 mmol/L (ref 22–32)
pCO2 arterial: 37.2 mmHg (ref 32.0–48.0)
pH, Arterial: 7.455 — ABNORMAL HIGH (ref 7.350–7.450)
pO2, Arterial: 57 mmHg — ABNORMAL LOW (ref 83.0–108.0)

## 2019-10-23 LAB — HEPARIN LEVEL (UNFRACTIONATED)
Heparin Unfractionated: 1.18 IU/mL — ABNORMAL HIGH (ref 0.30–0.70)
Heparin Unfractionated: 0.31 IU/mL (ref 0.30–0.70)

## 2019-10-23 LAB — RENAL FUNCTION PANEL
Albumin: 2.6 g/dL — ABNORMAL LOW (ref 3.5–5.0)
Albumin: 2.7 g/dL — ABNORMAL LOW (ref 3.5–5.0)
Anion gap: 9 (ref 5–15)
Anion gap: 11 (ref 5–15)
BUN: 5 mg/dL — ABNORMAL LOW (ref 8–23)
BUN: 6 mg/dL — ABNORMAL LOW (ref 8–23)
CO2: 26 mmol/L (ref 22–32)
CO2: 26 mmol/L (ref 22–32)
Calcium: 8.3 mg/dL — ABNORMAL LOW (ref 8.9–10.3)
Calcium: 8.6 mg/dL — ABNORMAL LOW (ref 8.9–10.3)
Chloride: 99 mmol/L (ref 98–111)
Chloride: 98 mmol/L (ref 98–111)
Creatinine, Ser: 1.58 mg/dL — ABNORMAL HIGH (ref 0.44–1.00)
Creatinine, Ser: 1.57 mg/dL — ABNORMAL HIGH (ref 0.44–1.00)
GFR calc Af Amer: 37 mL/min — ABNORMAL LOW (ref >60)
GFR calc Af Amer: 38 mL/min — ABNORMAL LOW (ref >60)
GFR calc non Af Amer: 32 mL/min — ABNORMAL LOW (ref >60)
GFR calc non Af Amer: 33 mL/min — ABNORMAL LOW (ref >60)
Glucose, Bld: 113 mg/dL — ABNORMAL HIGH (ref 70–99)
Glucose, Bld: 151 mg/dL — ABNORMAL HIGH (ref 70–99)
Phosphorus: 2.1 mg/dL — ABNORMAL LOW (ref 2.5–4.6)
Phosphorus: 3.2 mg/dL (ref 2.5–4.6)
Potassium: 4.3 mmol/L (ref 3.5–5.1)
Potassium: 4.6 mmol/L (ref 3.5–5.1)
Sodium: 134 mmol/L — ABNORMAL LOW (ref 135–145)
Sodium: 135 mmol/L (ref 135–145)

## 2019-10-23 LAB — CBC
HCT: 27.2 % — ABNORMAL LOW (ref 36.0–46.0)
Hemoglobin: 8.1 g/dL — ABNORMAL LOW (ref 12.0–15.0)
MCH: 24.2 pg — ABNORMAL LOW (ref 26.0–34.0)
MCHC: 29.8 g/dL — ABNORMAL LOW (ref 30.0–36.0)
MCV: 81.2 fL (ref 80.0–100.0)
Platelets: 172 10*3/uL (ref 150–400)
RBC: 3.35 MIL/uL — ABNORMAL LOW (ref 3.87–5.11)
RDW: 21.2 % — ABNORMAL HIGH (ref 11.5–15.5)
WBC: 5.8 10*3/uL (ref 4.0–10.5)
nRBC: 0.5 % — ABNORMAL HIGH (ref 0.0–0.2)

## 2019-10-23 LAB — COOXEMETRY PANEL
Carboxyhemoglobin: 2.1 % — ABNORMAL HIGH (ref 0.5–1.5)
Methemoglobin: 1.2 % (ref 0.0–1.5)
O2 Saturation: 73.7 %
Total hemoglobin: 8.2 g/dL — ABNORMAL LOW (ref 12.0–16.0)

## 2019-10-23 LAB — GLUCOSE, CAPILLARY
Glucose-Capillary: 89 mg/dL (ref 70–99)
Glucose-Capillary: 103 mg/dL — ABNORMAL HIGH (ref 70–99)
Glucose-Capillary: 99 mg/dL (ref 70–99)

## 2019-10-23 LAB — MAGNESIUM: Magnesium: 2.5 mg/dL — ABNORMAL HIGH (ref 1.7–2.4)

## 2019-10-23 MED ORDER — SODIUM PHOSPHATES 45 MMOLE/15ML IV SOLN
20.0000 mmol | Freq: Once | INTRAVENOUS | Status: AC
  Administered 2019-10-23: 20 mmol via INTRAVENOUS
  Filled 2019-10-23: qty 6.67

## 2019-10-23 NOTE — Progress Notes (Signed)
NAME:  Melissa Adams, MRN:  [id number], DOB:  August 09, 1946, LOS: 30 ADMISSION DATE:  10/11/2019, CONSULTATION DATE:  11/23 REFERRING MD: Royce Macadamia  , CHIEF COMPLAINT:  Worsening renal failure in setting of biventricular heart failure    Brief History   73 year old female admitted w/ acute biventricular heart failure. EF decreased from 40-45% down to 20-25% (unclear if this is rate related or due to myocarditis from [place]) Incidentally COVID-19 positive.   PCCM asked to see 11/23 for progressive cardio-renal syndrome and need for HD on 11/23  History of present illness   73 year old female w/ hx as mentioned below. Admitted 11/16 w/ cc: weight gain (22 lbs) and worsening shortness of breath. BNP elevated. Admitted w/ working dx of decompensated HF and hyponatremia.  ->incidentally fund to be COVID-19 positive.  Started on Remdesivir (steroids stopped as felt mostly HF) and lasix 11/18; Started on milrinone w/ lasix and PICC placed for CVP. 11/20 to ICU for close obs 11/21 got tolvaptan per cards Na 128 felt d/t volume overload 11/22 lasix gtt. Repeated Tolvaptan  11/23 renal fxn worse, still volume overloaded. 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 11/20 to ICU for close obs 11/21 got tolvaptan per cards Na 128 felt d/t volume overload 11/22 lasix gtt. Repeated Tolvaptan  11/23 renal fxn worse, still volume overloaded. PCCM asked to see for HD cath and assist w/ RX 11/24- present, pulling fluid  Consults:  11/16 cards 11/17 renal 11/18 heart failure  11/23 critical care   Procedures:  HD cath 11/23 >   Significant Diagnostic Tests:  11/17 echo:  EF 20-25%, dilated LV, enlarged  RV/LA/RA.  Micro Data:  11/16 and 11/17 COVID: positive   Antimicrobials:  Remdesivir 11/17-->11/22  Interim history/subjective:  Awake, alert, denies pain, breathing improving as fluid is taken off.  Objective   Blood pressure (!) 80/44, pulse (!) 108, temperature 98.3 F (36.8 C), temperature source Oral, resp. rate (!) 22, height 5\' 3"  (1.6 m), weight 111.3 kg, SpO2 99 %. CVP:  [5 mmHg-21 mmHg] 5 mmHg      Intake/Output Summary (Last 24 hours) at 10/23/2019 0908 Last data filed at 10/23/2019 0900 Gross per 24 hour  Intake 2217.29 ml  Output 3974 ml  Net -1756.71 ml   Filed Weights   10/21/19 0500 10/22/19 0458 10/23/19 0435  Weight: 115.1 kg 111.9 kg 111.3 kg    Examination: GEN: elderly woman in NAD HEENT: MMM, trachea midline CV: Irregular, ext warm PULM: Clear, no accessory muscle use GI: Soft, +BS EXT: Global anasarca improving NEURO: Moves all 4 ext to command  PSYCH: RASS 0 SKIN: No rashes  11/23 CXR fluid overload Has ABG for some reason, looks fine BMET/CBC look good with exception of low phos CBG okay  Assessment & Plan:   # Acute on chronic biventricular heart failure w/ EF down to 20-25% (baseline was 40-45%); c/b known mod TR and  MR.- differential includes myocarditis, arrhythmia induced on top of valvular disease. # Afib- persistent despite amiodarone, rates okay # COVID 19 infection- no obvious lung involvement.  S/p remdesivir and steroids. # Cardiorenal syndrome- on CRRT  -  Milrinone, amiodarone per CHF team - Heparin gtt - Continue to push fluid removal, iHD sometime in next couple days per renal note - Keep day/night cycles to reduce delirium risk - Airborne isolation - Give and encourage IS - DCCV and TEE anticipated during this admission - Appreciate CHF/nephro help, will need lots of PT/OT once off CRRT/drips  Best practice:  Diet:HF diet  Pain/Anxiety/Delirium protocol (if indicated): NA VAP protocol (if indicated): na DVT  prophylaxis: iv HEPARIN  GI prophylaxis: NA Glucose control: ssi Mobility: w/ assist Code Status: full code  Disposition: ICU   Erskine Emery MD PCCM

## 2019-10-23 NOTE — Progress Notes (Signed)
ANTICOAGULATION CONSULT NOTE  Pharmacy Consult for Heparin Indication: atrial fibrillation  Patient Measurements: Height: 5\' 3"  (160 cm) Weight: 245 lb 6 oz (111.3 kg) IBW/kg (Calculated) : 52.4 Heparin Dosing Weight: 79.8 kg  Vital Signs: Temp: 98.3 F (36.8 C) (11/28 0400) Temp Source: Oral (11/28 0400) BP: 110/65 (11/28 1130) Pulse Rate: 87 (11/28 1130)  Labs: Recent Labs    10/21/19 0412  10/22/19 0447  10/22/19 0539 10/22/19 1603 10/23/19 0430 10/23/19 0431 10/23/19 0432 10/23/19 0629  HGB 8.4*  --  8.2*  --   --   --  8.1*  --  9.5*  --   HCT 28.1*  --  27.0*  --   --   --  27.2*  --  28.0*  --   PLT 191  --  183  --   --   --  172  --   --   --   HEPARINUNFRC 1.08*   < >  --    < > 0.45  --   --  1.18*  --  0.31  CREATININE 1.49*   < > 1.36*  --   --  1.58* 1.58*  --   --   --    < > = values in this interval not displayed.   Assessment: 73 yr old female presenting with SOB and weight gain - on apixaban PTA for Afib, on hold for now. Last dose of Eliquis was on 11/8.   Pt was told by outside hospital that she required mitral/tricupsid valve replacement > this surgery was deferred upon transfer to [facility]. Now delayed due to incidentally found to be COVID + s/p treatment  LE dopplers and V/Q scan neg for PE/DVT. Cardiology planning TEE-DCCV this admission.  Heparin level this morning remains therapeutic (HL 0.31, goal of 0.3-0.7). Hgb up to 9.5, plts wnl. No bleeding noted.   Goal of Therapy:  Heparin level 0.3-0.7 units/ml  Monitor platelets by anticoagulation protocol: Yes  Plan:  - Continue Heparin at 850 units/hr (8.5 ml/hr) - Will continue to monitor for any signs/symptoms of bleeding and will follow up with heparin level in the AM  Thank you for allowing pharmacy to be a part of this patient's care.  Alycia Rossetti, PharmD, BCPS Clinical Pharmacist Clinical phone for 10/23/2019: [phone] 10/23/2019 11:48 AM   **Pharmacist phone directory can now be  found on [email or web address] ([email or web address]).  Listed under [place].

## 2019-10-23 NOTE — Progress Notes (Signed)
Assisted tele visit to patient with provider.  Keven Soucy M, RN  

## 2019-10-23 NOTE — Progress Notes (Signed)
Progress Note  Patient Name: Melissa Adams Date of Encounter: 10/23/2019  Primary Cardiologist: Dr Aundra Dubin  Subjective   Improving; no CP or dyspnea  Inpatient Medications    Scheduled Meds: . allopurinol  300 mg Oral Daily  . atorvastatin  40 mg Oral Daily  . Chlorhexidine Gluconate Cloth  6 each Topical Daily  . insulin aspart  0-5 Units Subcutaneous QHS  . insulin aspart  0-9 Units Subcutaneous TID WC  . mouth rinse  15 mL Mouth Rinse BID  . multivitamin with minerals  1 tablet Oral Daily  . vitamin C  500 mg Oral Daily  . zinc sulfate  220 mg Oral Daily   Continuous Infusions: .  prismasol BGK 4/2.5 400 mL/hr at 10/23/19 0400  .  prismasol BGK 4/2.5 300 mL/hr at 10/22/19 1821  . sodium chloride Stopped (10/23/19 0107)  . amiodarone 30 mg/hr (10/23/19 0700)  . heparin 850 Units/hr (10/23/19 0700)  . milrinone 0.125 mcg/kg/min (10/23/19 0700)  . prismasol BGK 4/2.5 1,200 mL/hr at 10/23/19 0650  . sodium phosphate  Dextrose 5% IVPB     PRN Meds: sodium chloride, acetaminophen, cyclobenzaprine, heparin, ondansetron (ZOFRAN) IV, polyethylene glycol, sodium chloride flush, traMADol, white petrolatum   Vital Signs    Vitals:   10/23/19 0630 10/23/19 0646 10/23/19 0700 10/23/19 0800  BP: (!) 98/45 112/66 (!) 130/112 (!) 100/54  Pulse: 93 (!) 101 (!) 116 (!) 102  Resp: 15 18 20 17   Temp:      TempSrc:      SpO2: 97% 97% 95% 97%  Weight:      Height:        Intake/Output Summary (Last 24 hours) at 10/23/2019 0806 Last data filed at 10/23/2019 0800 Gross per 24 hour  Intake 2353.65 ml  Output 3979 ml  Net -1625.35 ml   Last 3 Weights 10/23/2019 10/22/2019 10/21/2019  Weight (lbs) 245 lb 6 oz 246 lb 11.1 oz 253 lb 12 oz  Weight (kg) 111.3 kg 111.9 kg 115.1 kg      Telemetry    Atrial fibrillation with PVCs or aberrantly conducted beats; 15 beats NSVT - Personally Reviewed   Physical Exam  Pt seen virtually due to covid positive GEN: No acute  distress.  Answers questions appropriately Cardiac: irregular and tachycardic based on telemetry  Labs    High Sensitivity Troponin:   Recent Labs  Lab 10/11/19 1309 10/11/19 1616 10/12/19 0431 10/12/19 0500  TROPONINIHS 30* 33* 27* 31*      Chemistry Recent Labs  Lab 10/22/19 0447 10/22/19 1603 10/23/19 0430 10/23/19 0432  NA 133* 134* 134* 132*  K 4.3 4.3 4.3 4.3  CL 96* 99 99  --   CO2 26 27 26   --   GLUCOSE 162* 113* 113*  --   BUN 5* 5* 5*  --   CREATININE 1.36* 1.58* 1.58*  --   CALCIUM 8.7* 8.5* 8.3*  --   ALBUMIN 2.9* 2.9* 2.6*  --   GFRNONAA 39* 32* 32*  --   GFRAA 45* 37* 37*  --   ANIONGAP 11 8 9   --      Hematology Recent Labs  Lab 10/21/19 0412 10/22/19 0447 10/23/19 0430 10/23/19 0432  WBC 5.9 5.4 5.8  --   RBC 3.49* 3.37* 3.35*  --   HGB 8.4* 8.2* 8.1* 9.5*  HCT 28.1* 27.0* 27.2* 28.0*  MCV 80.5 80.1 81.2  --   MCH 24.1* 24.3* 24.2*  --   MCHC 29.9* 30.4  29.8*  --   RDW 19.8* 20.4* 21.2*  --   PLT 191 183 172  --     DDimer  Recent Labs  Lab 10/17/19 0519 10/18/19 1317  DDIMER 1.16* 0.83*     Patient Profile     73 y.o. female with atrial fibrillation, biventricular CHF, acute renal failure and covid positive. Echo 11/20 showed severe LV dysfunction, severe RV dysfunction, moderate MR and TR.  Assessment & Plan    1 acute on chronic systolic congestive heart failure-patient had a previous cardiac catheterization September 2020 showing no significant coronary disease.  LV function has decreased compared to last study in [place].  Etiology is felt possibly secondary to atrial fibrillation with tachycardia.  Myocarditis due to Covid 19 also possible. Coox 74.  CVP down to 5.  We will continue CVVH.  Milrinone decreased yesterday and will likely discontinue tomorrow.  Will need to add cardiac medications later as blood pressure allows.  2 valvular heart disease-most recent echocardiogram showed moderate mitral and tricuspid  regurgitation.  No plans for valve surgery at this time.  3 persistent atrial fibrillation-heart rate appears to be improved.  We will continue amiodarone and heparin.  Plan is for eventual TEE guided cardioversion early next week.  Hopefully as COVID-19 infection has improved chances of holding sinus rhythm will also improve.  4 COVID-19 infection-management per critical care medicine.  5 acute kidney injury-nephrology following.  Continuing CVVH.  For questions or updates, please contact [place] Please consult [email or web address] for contact info under        Signed, Kirk Ruths, MD  10/23/2019, 8:06 AM

## 2019-10-23 NOTE — Progress Notes (Signed)
[facility] Progress Note    Assessment/ Plan:   1.  AKI on CKD 3: baseline Cr reportedly 1.2.  Likely d/t decompensated CHF/cardiorenal.  UA with 6-10 RBCs and no WBCs, UP/C 0.2, not really reflective of GN or COVID AKI. Renal US neg.   Cr uptrending in setting of worsening hemodynamics, Afib, and low BP--> cardiogenic shock picture.  Didn't really diurese well despite milrinone and Lasix gtt.  CRRT on 11/23 after nontunneled catheter with critical care - Continue CRRT, 125 cc/hr net neg as tolerated 4K fluids.  Continue CRRT for now and reassess tomorrow.  Decreased to 1257ml/hr.  Hope to transition to iHD soon - 11/30?  Replete phos   2.  Severe biventricular CHF: was due for MV and TV replacement in [place] on 11/18.  TTE performed--> Biventricular failure with EF 20-25%.  Advanced HF following, appreciate.  milrinone  3.  COVID + : therapies per primary team   4.  Afib; was on metop and eliquis as OP, now on amio gtt and hep gtt.  5.  Hyponatremia: improved; on CRRT   6. Anemia - iron deficiency and AKI - no acute indication for PRBC's.  S/p aranesp 40 mcg on 11/24 once   7.  Dispo: in ICU   Subjective:    Spoke with nursing.  Seen view window on CRRT procedure supervised.  She has tolerated net neg 125 cc/hr.  RIJ nontunneled catheter.  She has been anuric and had 4 liters off over 11/27 as charted thus far.  She has been on room air.  Still on amio, milrinone and heparin still.   Review of systems:  Per nursing  Denies shortness of breath  No chest pain Denies n/v   Objective:   BP (!) 100/49   Pulse 93   Temp 98.3 F (36.8 C) (Oral)   Resp 15   Ht 5\' 3"  (1.6 m)   Wt 111.3 kg   SpO2 97%   BMI 43.47 kg/m   Intake/Output Summary (Last 24 hours) at 10/23/2019 0640 Last data filed at 10/23/2019 0600 Gross per 24 hour  Intake 2135.2 ml  Output 4109 ml  Net -1973.8 ml   Weight change: -0.6 kg  Physical Exam: examined on 11/27 in person with  appropriate PPE.  On 11/28 patient exam discussed with nursing and pt viewed through window   Due to the nature of this patient's suspected COVID-19 with isolation and in keeping with efforts to prevent the spread of infection and to conserve personal protective equipment, a physical exam was not personally performed.  Patient's symptoms and exam were discussed in detail with the RN.  A chart review of other providers notes and the patient's lab work as well as review of other pertinent studies was performed.  Exam details from prior documentation were reviewed specifically and confirmed with the bedside nurse.  Location of service: [place] adult female in bed in no acute distress  HEENT normocephalic atraumatic Neck supple trachea midline Lungs clear but diminshed on exam per nursing  Heart tachy and irregular/afib - on amio Abdomen soft nontender distended per nursing  Extremities unna boots on feet with around 2+ edema lower extremities per nursing  Psych normal mood and affect per nursing  Neuro - alert and oriented x 3 per nursing  Access RIJ nontunneled catheter   Imaging: No results found.  Labs: BMET Recent Labs  Lab 10/20/19 0456 10/20/19 0457 10/20/19 1640 10/21/19 0412 10/21/19 1600  10/22/19 0447 10/22/19 1603 10/23/19 0430 10/23/19 0432  NA 131* 131* 130* 133* 133* 133* 134* 134* 132*  K 4.2 4.2 4.3 4.1 4.2 4.3 4.3 4.3 4.3  CL 94* 94* 96* 95* 98 96* 99 99  --   CO2 27 27 26 28 27 26 27 26   --   GLUCOSE 102* 103* 111* 179* 116* 162* 113* 113*  --   BUN 17 17 12  7* 8 5* 5* 5*  --   CREATININE 1.75* 1.77* 1.56* 1.49* 1.54* 1.36* 1.58* 1.58*  --   CALCIUM 8.6* 8.7* 8.7* 8.7* 8.5* 8.7* 8.5* 8.3*  --   PHOS 2.6  --  1.9* 2.5 2.3* 1.6* 2.4* 2.1*  --    CBC Recent Labs  Lab 10/18/19 0439  10/20/19 0457 10/21/19 0412 10/22/19 0447 10/23/19 0430 10/23/19 0432  WBC 7.0   < > 6.4 5.9 5.4 5.8  --   NEUTROABS 5.0  --   --   --   --   --   --    HGB 8.4*   < > 8.2* 8.4* 8.2* 8.1* 9.5*  HCT 26.8*   < > 27.0* 28.1* 27.0* 27.2* 28.0*  MCV 74.9*   < > 78.0* 80.5 80.1 81.2  --   PLT 227   < > 215 191 183 172  --    < > = values in this interval not displayed.    Medications:    . allopurinol  300 mg Oral Daily  . atorvastatin  40 mg Oral Daily  . Chlorhexidine Gluconate Cloth  6 each Topical Daily  . insulin aspart  0-5 Units Subcutaneous QHS  . insulin aspart  0-9 Units Subcutaneous TID WC  . mouth rinse  15 mL Mouth Rinse BID  . multivitamin with minerals  1 tablet Oral Daily  . vitamin C  500 mg Oral Daily  . zinc sulfate  220 mg Oral Daily    Claudia Desanctis  10/23/2019, 6:40 AM

## 2019-10-23 NOTE — Progress Notes (Signed)
Elink made aware of patients' 15 beat run of vtach. Will continue to monitor patient.

## 2019-10-23 NOTE — Progress Notes (Signed)
eLink Physician-Brief Progress Note Patient Name: Melissa Adams DOB: Jul 23, 1946 MRN: [id number]   Date of Service  10/23/2019  HPI/Events of Note  Notified of short run of VTach, ongoing dialysis. K 4.3, Mg 2.6, corrected calcium 9.4  eICU Interventions  Phos low at 2.9 earlier but already give correction. Continue to monitor and check AM electrolytes, will correct prn     Intervention Category Major Interventions: Arrhythmia - evaluation and management  Judd Lien 10/23/2019, 1:49 AM

## 2019-10-24 DIAGNOSIS — I5021 Acute systolic (congestive) heart failure: Secondary | ICD-10-CM | POA: Diagnosis not present

## 2019-10-24 LAB — HEPARIN LEVEL (UNFRACTIONATED): Heparin Unfractionated: 0.34 IU/mL (ref 0.30–0.70)

## 2019-10-24 LAB — CBC
HCT: 29.1 % — ABNORMAL LOW (ref 36.0–46.0)
Hemoglobin: 8.5 g/dL — ABNORMAL LOW (ref 12.0–15.0)
MCH: 24.1 pg — ABNORMAL LOW (ref 26.0–34.0)
MCHC: 29.2 g/dL — ABNORMAL LOW (ref 30.0–36.0)
MCV: 82.4 fL (ref 80.0–100.0)
Platelets: 167 10*3/uL (ref 150–400)
RBC: 3.53 MIL/uL — ABNORMAL LOW (ref 3.87–5.11)
RDW: 22.4 % — ABNORMAL HIGH (ref 11.5–15.5)
WBC: 6.3 10*3/uL (ref 4.0–10.5)
nRBC: 0.3 % — ABNORMAL HIGH (ref 0.0–0.2)

## 2019-10-24 LAB — MAGNESIUM: Magnesium: 2.4 mg/dL (ref 1.7–2.4)

## 2019-10-24 LAB — RENAL FUNCTION PANEL
Albumin: 3 g/dL — ABNORMAL LOW (ref 3.5–5.0)
Albumin: 2.8 g/dL — ABNORMAL LOW (ref 3.5–5.0)
Anion gap: 12 (ref 5–15)
Anion gap: 8 (ref 5–15)
BUN: 7 mg/dL — ABNORMAL LOW (ref 8–23)
BUN: 7 mg/dL — ABNORMAL LOW (ref 8–23)
CO2: 26 mmol/L (ref 22–32)
CO2: 26 mmol/L (ref 22–32)
Calcium: 8.8 mg/dL — ABNORMAL LOW (ref 8.9–10.3)
Calcium: 8.7 mg/dL — ABNORMAL LOW (ref 8.9–10.3)
Chloride: 97 mmol/L — ABNORMAL LOW (ref 98–111)
Chloride: 98 mmol/L (ref 98–111)
Creatinine, Ser: 1.57 mg/dL — ABNORMAL HIGH (ref 0.44–1.00)
Creatinine, Ser: 1.58 mg/dL — ABNORMAL HIGH (ref 0.44–1.00)
GFR calc Af Amer: 38 mL/min — ABNORMAL LOW (ref >60)
GFR calc Af Amer: 37 mL/min — ABNORMAL LOW (ref >60)
GFR calc non Af Amer: 33 mL/min — ABNORMAL LOW (ref >60)
GFR calc non Af Amer: 32 mL/min — ABNORMAL LOW (ref >60)
Glucose, Bld: 87 mg/dL (ref 70–99)
Glucose, Bld: 99 mg/dL (ref 70–99)
Phosphorus: 2.1 mg/dL — ABNORMAL LOW (ref 2.5–4.6)
Phosphorus: 2.4 mg/dL — ABNORMAL LOW (ref 2.5–4.6)
Potassium: 4.4 mmol/L (ref 3.5–5.1)
Potassium: 4.4 mmol/L (ref 3.5–5.1)
Sodium: 135 mmol/L (ref 135–145)
Sodium: 132 mmol/L — ABNORMAL LOW (ref 135–145)

## 2019-10-24 LAB — GLUCOSE, CAPILLARY
Glucose-Capillary: 125 mg/dL — ABNORMAL HIGH (ref 70–99)
Glucose-Capillary: 83 mg/dL (ref 70–99)
Glucose-Capillary: 95 mg/dL (ref 70–99)
Glucose-Capillary: 107 mg/dL — ABNORMAL HIGH (ref 70–99)
Glucose-Capillary: 103 mg/dL — ABNORMAL HIGH (ref 70–99)

## 2019-10-24 LAB — COOXEMETRY PANEL
Carboxyhemoglobin: 1.4 % (ref 0.5–1.5)
Methemoglobin: 0.6 % (ref 0.0–1.5)
O2 Saturation: 56.9 %
Total hemoglobin: 8.9 g/dL — ABNORMAL LOW (ref 12.0–16.0)

## 2019-10-24 MED ORDER — SODIUM PHOSPHATES 45 MMOLE/15ML IV SOLN
10.0000 mmol | Freq: Once | INTRAVENOUS | Status: AC
  Administered 2019-10-24: 10 mmol via INTRAVENOUS
  Filled 2019-10-24: qty 3.33

## 2019-10-24 MED ORDER — SODIUM PHOSPHATES 45 MMOLE/15ML IV SOLN
20.0000 mmol | Freq: Once | INTRAVENOUS | Status: AC
  Administered 2019-10-24: 20 mmol via INTRAVENOUS
  Filled 2019-10-24: qty 6.67

## 2019-10-24 NOTE — Progress Notes (Signed)
Spoke with Royce Macadamia, MD for nephrology. She stated to turn CRRT off around 6 p.m. and will reassess in the morning.

## 2019-10-24 NOTE — Progress Notes (Signed)
NAME:  Melissa Adams, MRN:  [id number], DOB:  [birth year], LOS: 12 ADMISSION DATE:  10/11/2019, CONSULTATION DATE:  11/23 REFERRING MD: Royce Macadamia  , CHIEF COMPLAINT:  Worsening renal failure in setting of biventricular heart failure    Brief History   73 year old female admitted w/ acute biventricular heart failure. EF decreased from 40-45% down to 20-25% (unclear if this is rate related or due to myocarditis from [place]) Incidentally COVID-19 positive.   PCCM asked to see 11/23 for progressive cardio-renal syndrome and need for HD on 11/23  History of present illness   73 year old female w/ hx as mentioned below. Admitted 11/16 w/ cc: weight gain (22 lbs) and worsening shortness of breath. BNP elevated. Admitted w/ working dx of decompensated HF and hyponatremia.  ->incidentally fund to be COVID-19 positive.  Started on Remdesivir (steroids stopped as felt mostly HF) and lasix 11/18; Started on milrinone w/ lasix and PICC placed for CVP. 11/20 to ICU for close obs 11/21 got tolvaptan per cards Na 128 felt d/t volume overload 11/22 lasix gtt. Repeated Tolvaptan  11/23 renal fxn worse, still volume overloaded. 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 11/20 to ICU for close obs 11/21 got tolvaptan per cards Na 128 felt d/t volume overload 11/22 lasix gtt. Repeated Tolvaptan  11/23 renal fxn worse, still volume overloaded. PCCM asked to see for HD cath and assist w/ RX 11/24- present, pulling fluid  Consults:  11/16 cards 11/17 renal 11/18 heart failure  11/23 critical care   Procedures:  HD cath 11/23 >   Significant Diagnostic Tests:  11/17 echo:  EF 20-25%, dilated LV, enlarged  RV/LA/RA.  Micro Data:  11/16 and 11/17 COVID: positive   Antimicrobials:  Remdesivir 11/17-->11/22  Interim history/subjective:  Continues to tolerate fluid offloading, denies breathing problems.  Objective   Blood pressure (!) 127/97, pulse (!) 121, temperature 97.9 F (36.6 C), temperature source Oral, resp. rate (!) 23, height 5\' 3"  (1.6 m), weight 111 kg, SpO2 99 %. CVP:  [5 mmHg-12 mmHg] 8 mmHg      Intake/Output Summary (Last 24 hours) at 10/24/2019 0748 Last data filed at 10/24/2019 0700 Gross per 24 hour  Intake 2018.36 ml  Output 4895 ml  Net -2876.64 ml   Filed Weights   10/22/19 0458 10/23/19 0435 10/24/19 0428  Weight: 111.9 kg 111.3 kg 111 kg    Examination: GEN: elderly woman in NAD HEENT: MMM, trachea midline CV: Irregular, ext warm PULM: Clear, no accessory muscle use GI: Soft, +BS EXT: Global anasarca looks almost resolved NEURO: Moves all 4 ext to command  PSYCH: RASS 0 SKIN: No rashes, LUE PICC CDI, RIJ HD catheter CDI  11/23 CXR fluid overload BMET/CBC look good with exception of low phos CBG on lower side  Assessment & Plan:   # Acute on chronic biventricular heart failure w/ EF down to 20-25% (baseline was 40-45%); c/b known mod TR and  MR.- differential includes myocarditis, arrhythmia induced on top of valvular disease. # Afib- persistent despite amiodarone, rates okay # COVID 19 infection- no obvious lung involvement.  S/p remdesivir and steroids. # Cardiorenal syndrome- on  CRRT  - Milrinone titration, amiodarone per CHF team - Heparin gtt - Continue to push fluid removal, iHD sometime in next couple days per renal note - Keep day/night cycles to reduce delirium risk - Airborne isolation - Give and encourage IS - DCCV and TEE anticipated during this admission - Appreciate CHF/nephro help, will need lots of PT/OT once off CRRT/drips  Best practice:  Diet:HF diet  Pain/Anxiety/Delirium protocol (if indicated): NA VAP protocol (if  indicated): na DVT prophylaxis: iv HEPARIN  GI prophylaxis: NA Glucose control: ssi Mobility: w/ assist Code Status: full code  Disposition: ICU pending CRRT and milrinone liberation   Erskine Emery MD PCCM

## 2019-10-24 NOTE — Progress Notes (Signed)
[facility] Progress Note    Assessment/ Plan:   1.  AKI on CKD 3: baseline Cr reportedly 1.2.  Likely d/t decompensated CHF/cardiorenal.  UA with 6-10 RBCs and no WBCs, UP/C 0.2, not really reflective of GN or COVID AKI. Renal US neg.   Cr uptrending in setting of worsening hemodynamics, Afib, and low BP--> cardiogenic shock picture.  Didn't really diurese well despite milrinone and Lasix gtt.  CRRT initiated on 11/23 after nontunneled catheter with critical care - Continue CRRT today - will not restart if clots or cartridge expires.  Net neg 100 cc/hr net neg as tolerated, 4K fluids.  Dialysate was decreased to 1236ml/hr.  Plan to transition to [place] soon - 11/30 vs 12/1.  Replete phos. Reduce pre-filter fluids to 300 ml/hr    2.  Severe biventricular CHF: was due for MV and TV replacement in [place] on 11/18.  TTE performed--> Biventricular failure with EF 20-25%.  Advanced HF following, appreciate.  milrinone  3.  COVID + : therapies per primary team   4.  Afib; was on metop and eliquis as OP, now on amio gtt and hep gtt. Note eventual plan for likely TEE/cardioversion this week  5.  Hyponatremia: improved; on CRRT   6. Anemia - iron deficiency and AKI - no acute indication for PRBC's.  S/p aranesp 40 mcg on 11/24 once   7.  Dispo: in ICU   Subjective:    Seen on CRRT and procedure supervised.  RIJ nontunneled catheter.  She has been anuric and had 4.7 liters off over 11/28 as charted thus far.  She has been on room air.  Still on amio, milrinone and heparin still.  States no shortness of breath    Review of systems:   Denies shortness of breath  No chest pain Denies n/v   Objective:   BP (!) 99/58   Pulse 94   Temp 97.9 F (36.6 C) (Oral)   Resp (!) 26   Ht 5\' 3"  (1.6 m)   Wt 111 kg   SpO2 94%   BMI 43.35 kg/m   Intake/Output Summary (Last 24 hours) at 10/24/2019 1191 Last data filed at 10/24/2019 0600 Gross per 24 hour  Intake 2258.36 ml  Output 4871 ml   Net -2612.64 ml   Weight change: -0.3 kg  Physical Exam: examined on 11/29 in person with appropriate PPE  General adult female in bed in no acute distress   HEENT normocephalic atraumatic Neck supple trachea midline Lungs on exam clear but diminished breath sounds Heart tachy, irregular - on amio Abdomen soft nontender distended  Extremities 1+ edema lower extremities  Psych normal mood and affect  Neuro - alert and oriented x 3 follows commands Access RIJ nontunneled catheter   Imaging: No results found.  Labs: BMET Recent Labs  Lab 10/21/19 0412 10/21/19 1600 10/22/19 0447 10/22/19 1603 10/23/19 0430 10/23/19 0432 10/23/19 1656 10/24/19 0420  NA 133* 133* 133* 134* 134* 132* 135 135  K 4.1 4.2 4.3 4.3 4.3 4.3 4.6 4.4  CL 95* 98 96* 99 99  --  98 97*  CO2 28 27 26 27 26   --  26 26  GLUCOSE 179* 116* 162* 113* 113*  --  151* 87  BUN 7* 8 5* 5* 5*  --  6* 7*  CREATININE 1.49* 1.54* 1.36* 1.58* 1.58*  --  1.57* 1.57*  CALCIUM 8.7* 8.5* 8.7* 8.5* 8.3*  --  8.6* 8.8*  PHOS 2.5 2.3* 1.6* 2.4*  2.1*  --  3.2 2.1*   CBC Recent Labs  Lab 10/18/19 0439  10/21/19 0412 10/22/19 0447 10/23/19 0430 10/23/19 0432 10/24/19 0420  WBC 7.0   < > 5.9 5.4 5.8  --  6.3  NEUTROABS 5.0  --   --   --   --   --   --   HGB 8.4*   < > 8.4* 8.2* 8.1* 9.5* 8.5*  HCT 26.8*   < > 28.1* 27.0* 27.2* 28.0* 29.1*  MCV 74.9*   < > 80.5 80.1 81.2  --  82.4  PLT 227   < > 191 183 172  --  167   < > = values in this interval not displayed.    Medications:    . allopurinol  300 mg Oral Daily  . atorvastatin  40 mg Oral Daily  . Chlorhexidine Gluconate Cloth  6 each Topical Daily  . insulin aspart  0-5 Units Subcutaneous QHS  . insulin aspart  0-9 Units Subcutaneous TID WC  . mouth rinse  15 mL Mouth Rinse BID  . multivitamin with minerals  1 tablet Oral Daily  . vitamin C  500 mg Oral Daily  . zinc sulfate  220 mg Oral Daily    Melissa Adams  10/24/2019, 6:49 AM

## 2019-10-24 NOTE — Progress Notes (Signed)
Assisted tele visit to patient with daughter. ° °Alvita Fana P, RN  °

## 2019-10-24 NOTE — Progress Notes (Signed)
Assisted tele visit to patient with provider. Patient name, room number and text number verified with provider.  Melissa Adams, Melissa Hoard, RN

## 2019-10-24 NOTE — Progress Notes (Signed)
Progress Note  Patient Name: Melissa Adams Date of Encounter: 10/24/2019  Primary Cardiologist: Dr Aundra Dubin  Subjective   Dyspnea and chest pain resolved  Inpatient Medications    Scheduled Meds: . allopurinol  300 mg Oral Daily  . atorvastatin  40 mg Oral Daily  . Chlorhexidine Gluconate Cloth  6 each Topical Daily  . insulin aspart  0-5 Units Subcutaneous QHS  . insulin aspart  0-9 Units Subcutaneous TID WC  . mouth rinse  15 mL Mouth Rinse BID  . multivitamin with minerals  1 tablet Oral Daily  . vitamin C  500 mg Oral Daily  . zinc sulfate  220 mg Oral Daily   Continuous Infusions: .  prismasol BGK 4/2.5 300 mL/hr at 10/24/19 0746  .  prismasol BGK 4/2.5 300 mL/hr at 10/24/19 0913  . sodium chloride Stopped (10/24/19 0746)  . amiodarone 30 mg/hr (10/24/19 1047)  . heparin 850 Units/hr (10/23/19 2105)  . milrinone 0.125 mcg/kg/min (10/23/19 2314)  . prismasol BGK 4/2.5 1,200 mL/hr at 10/24/19 0848  . sodium phosphate  Dextrose 5% IVPB 43 mL/hr at 10/24/19 1100   PRN Meds: sodium chloride, acetaminophen, cyclobenzaprine, heparin, ondansetron (ZOFRAN) IV, polyethylene glycol, sodium chloride flush, traMADol, white petrolatum   Vital Signs    Vitals:   10/24/19 0800 10/24/19 0905 10/24/19 1000 10/24/19 1010  BP: 117/75 (!) 131/99 (!) 84/45 (!) 108/57  Pulse: (!) 101 (!) 128 96 (!) 109  Resp: (!) 25 20 (!) 22 (!) 22  Temp: (!) 97.3 F (36.3 C)     TempSrc: Oral     SpO2: 98% 96% 100% 100%  Weight:      Height:        Intake/Output Summary (Last 24 hours) at 10/24/2019 1103 Last data filed at 10/24/2019 1100 Gross per 24 hour  Intake 2209.51 ml  Output 4970 ml  Net -2760.49 ml   Last 3 Weights 10/24/2019 10/23/2019 10/22/2019  Weight (lbs) 244 lb 11.4 oz 245 lb 6 oz 246 lb 11.1 oz  Weight (kg) 111 kg 111.3 kg 111.9 kg      Telemetry    Atrial fibrillation with PVCs or aberrantly conducted beats; rate upper normal to mildly increased - Personally  Reviewed   Physical Exam  Pt seen virtually due to covid positive GEN: NAD.  Answers questions appropriately Cardiac: irregular and tachycardic based on telemetry  Labs    High Sensitivity Troponin:   Recent Labs  Lab 10/11/19 1309 10/11/19 1616 10/12/19 0431 10/12/19 0500  TROPONINIHS 30* 33* 27* 31*      Chemistry Recent Labs  Lab 10/23/19 0430 10/23/19 0432 10/23/19 1656 10/24/19 0420  NA 134* 132* 135 135  K 4.3 4.3 4.6 4.4  CL 99  --  98 97*  CO2 26  --  26 26  GLUCOSE 113*  --  151* 87  BUN 5*  --  6* 7*  CREATININE 1.58*  --  1.57* 1.57*  CALCIUM 8.3*  --  8.6* 8.8*  ALBUMIN 2.6*  --  2.7* 3.0*  GFRNONAA 32*  --  33* 33*  GFRAA 37*  --  38* 38*  ANIONGAP 9  --  11 12     Hematology Recent Labs  Lab 10/22/19 0447 10/23/19 0430 10/23/19 0432 10/24/19 0420  WBC 5.4 5.8  --  6.3  RBC 3.37* 3.35*  --  3.53*  HGB 8.2* 8.1* 9.5* 8.5*  HCT 27.0* 27.2* 28.0* 29.1*  MCV 80.1 81.2  --  82.4  MCH 24.3* 24.2*  --  24.1*  MCHC 30.4 29.8*  --  29.2*  RDW 20.4* 21.2*  --  22.4*  PLT 183 172  --  167    DDimer  Recent Labs  Lab 10/18/19 1317  DDIMER 0.83*     Patient Profile     73 y.o. female with atrial fibrillation, biventricular CHF, acute renal failure and covid positive. Echo 11/20 showed severe LV dysfunction, severe RV dysfunction, moderate MR and TR.  Assessment & Plan    1 acute on chronic systolic congestive heart failure-patient had a previous cardiac catheterization September 2020 showing no significant coronary disease.  LV function has decreased compared to previous study in [place].  Etiology is felt possibly secondary to atrial fibrillation with tachycardia.  Myocarditis due to Covid 19 also possible. Coox 56.9.  CVP 13.  We will continue CVVH.  Will continue milrinone today.  Will need to add cardiac medications later as blood pressure allows.  2 valvular heart disease-most recent echocardiogram showed moderate mitral and tricuspid  regurgitation.  No plans for valve surgery at this time.  3 persistent atrial fibrillation-heart rate appears upper normal to mildly increased.  Continue amiodarone and heparin.  Plan is for eventual TEE guided cardioversion early next week.  Hopefully as COVID-19 infection improves she will be more likely to hold sinus.  4 COVID-19 infection-management per critical care medicine.  5 acute kidney injury-nephrology following.  Continuing CVVH.  For questions or updates, please contact [place] Please consult [email or web address] for contact info under        Signed, Kirk Ruths, MD  10/24/2019, 11:03 AM

## 2019-10-24 NOTE — Progress Notes (Signed)
ANTICOAGULATION CONSULT NOTE  Pharmacy Consult for Heparin Indication: atrial fibrillation  Patient Measurements: Height: 5\' 3"  (160 cm) Weight: 244 lb 11.4 oz (111 kg) IBW/kg (Calculated) : 52.4 Heparin Dosing Weight: 79.8 kg  Vital Signs: Temp: 97.8 F (36.6 C) (11/29 1200) Temp Source: Oral (11/29 1200) BP: 98/57 (11/29 1200) Pulse Rate: 96 (11/29 1200)  Labs: Recent Labs    10/22/19 0447  10/23/19 0430 10/23/19 0431 10/23/19 0432 10/23/19 0629 10/23/19 1656 10/24/19 0420  HGB 8.2*  --  8.1*  --  9.5*  --   --  8.5*  HCT 27.0*  --  27.2*  --  28.0*  --   --  29.1*  PLT 183  --  172  --   --   --   --  167  HEPARINUNFRC  --    < >  --  1.18*  --  0.31  --  0.34  CREATININE 1.36*   < > 1.58*  --   --   --  1.57* 1.57*   < > = values in this interval not displayed.   Assessment: 73 yr old female presenting with SOB and weight gain - on apixaban PTA for Afib, on hold for now. Last dose of Eliquis was on 11/8.   Pt was told by outside hospital that she required mitral/tricupsid valve replacement > this surgery was deferred upon transfer to [facility]. Now delayed due to incidentally found to be COVID + s/p treatment  LE dopplers and V/Q scan neg for PE/DVT. Cardiology planning TEE-DCCV this admission.  Heparin level this morning remains therapeutic (HL 0.34 << 0.31, goal of 0.3-0.7). Hgb 8.5 << 9.5 - will watch, plts wnl. No bleeding noted.   Goal of Therapy:  Heparin level 0.3-0.7 units/ml  Monitor platelets by anticoagulation protocol: Yes  Plan:  - Continue Heparin at 850 units/hr (8.5 ml/hr) - Will continue to monitor for any signs/symptoms of bleeding and will follow up with heparin level in the AM  Thank you for allowing pharmacy to be a part of this patient's care.  Alycia Rossetti, PharmD, BCPS Clinical Pharmacist Clinical phone for 10/24/2019: [phone] 10/24/2019 12:51 PM   **Pharmacist phone directory can now be found on [email or web address] ([email or web address]).  Listed under  [place].

## 2019-10-25 ENCOUNTER — Inpatient Hospital Stay (HOSPITAL_COMMUNITY): Payer: Medicare Other

## 2019-10-25 DIAGNOSIS — I5023 Acute on chronic systolic (congestive) heart failure: Secondary | ICD-10-CM | POA: Diagnosis not present

## 2019-10-25 DIAGNOSIS — I5043 Acute on chronic combined systolic (congestive) and diastolic (congestive) heart failure: Secondary | ICD-10-CM | POA: Diagnosis not present

## 2019-10-25 DIAGNOSIS — N179 Acute kidney failure, unspecified: Secondary | ICD-10-CM | POA: Diagnosis not present

## 2019-10-25 DIAGNOSIS — U071 COVID-19: Secondary | ICD-10-CM | POA: Diagnosis not present

## 2019-10-25 LAB — CBC
HCT: 27.1 % — ABNORMAL LOW (ref 36.0–46.0)
Hemoglobin: 8.3 g/dL — ABNORMAL LOW (ref 12.0–15.0)
MCH: 24.7 pg — ABNORMAL LOW (ref 26.0–34.0)
MCHC: 30.6 g/dL (ref 30.0–36.0)
MCV: 80.7 fL (ref 80.0–100.0)
Platelets: 143 10*3/uL — ABNORMAL LOW (ref 150–400)
RBC: 3.36 MIL/uL — ABNORMAL LOW (ref 3.87–5.11)
RDW: 22.9 % — ABNORMAL HIGH (ref 11.5–15.5)
WBC: 6.9 10*3/uL (ref 4.0–10.5)
nRBC: 0 % (ref 0.0–0.2)

## 2019-10-25 LAB — HEPARIN LEVEL (UNFRACTIONATED): Heparin Unfractionated: 0.31 IU/mL (ref 0.30–0.70)

## 2019-10-25 LAB — RENAL FUNCTION PANEL
Albumin: 2.8 g/dL — ABNORMAL LOW (ref 3.5–5.0)
Anion gap: 10 (ref 5–15)
BUN: 12 mg/dL (ref 8–23)
CO2: 25 mmol/L (ref 22–32)
Calcium: 8.6 mg/dL — ABNORMAL LOW (ref 8.9–10.3)
Chloride: 96 mmol/L — ABNORMAL LOW (ref 98–111)
Creatinine, Ser: 2.44 mg/dL — ABNORMAL HIGH (ref 0.44–1.00)
GFR calc Af Amer: 22 mL/min — ABNORMAL LOW (ref >60)
GFR calc non Af Amer: 19 mL/min — ABNORMAL LOW (ref >60)
Glucose, Bld: 86 mg/dL (ref 70–99)
Phosphorus: 3 mg/dL (ref 2.5–4.6)
Potassium: 4.3 mmol/L (ref 3.5–5.1)
Sodium: 131 mmol/L — ABNORMAL LOW (ref 135–145)

## 2019-10-25 LAB — MAGNESIUM: Magnesium: 2.4 mg/dL (ref 1.7–2.4)

## 2019-10-25 LAB — COOXEMETRY PANEL
Carboxyhemoglobin: 1.6 % — ABNORMAL HIGH (ref 0.5–1.5)
Methemoglobin: 0.5 % (ref 0.0–1.5)
O2 Saturation: 64.9 %
Total hemoglobin: 8.5 g/dL — ABNORMAL LOW (ref 12.0–16.0)

## 2019-10-25 LAB — GLUCOSE, CAPILLARY: Glucose-Capillary: 88 mg/dL (ref 70–99)

## 2019-10-25 MED ORDER — BISMUTH SUBSALICYLATE 262 MG/15ML PO SUSP
30.0000 mL | ORAL | Status: DC | PRN
  Administered 2019-10-25: 30 mL via ORAL
  Filled 2019-10-25: qty 236

## 2019-10-25 MED ORDER — HEPARIN SODIUM (PORCINE) 1000 UNIT/ML DIALYSIS
20.0000 [IU]/kg | INTRAMUSCULAR | Status: DC | PRN
  Filled 2019-10-25: qty 3

## 2019-10-25 MED ORDER — LIDOCAINE HCL (PF) 1 % IJ SOLN
5.0000 mL | INTRAMUSCULAR | Status: DC | PRN
  Filled 2019-10-25: qty 5

## 2019-10-25 MED ORDER — FUROSEMIDE 10 MG/ML IJ SOLN
120.0000 mg | Freq: Two times a day (BID) | INTRAVENOUS | Status: DC
  Administered 2019-10-25: 120 mg via INTRAVENOUS
  Filled 2019-10-25 (×3): qty 12

## 2019-10-25 MED ORDER — PENTAFLUOROPROP-TETRAFLUOROETH EX AERO: 1.0000 "application " | INHALATION_SPRAY | CUTANEOUS | Status: DC | PRN

## 2019-10-25 MED ORDER — CHLORHEXIDINE GLUCONATE CLOTH 2 % EX PADS
6.0000 | MEDICATED_PAD | Freq: Every day | CUTANEOUS | Status: DC
  Administered 2019-10-26 – 2019-11-05 (×8): 6 via TOPICAL

## 2019-10-25 MED ORDER — HEPARIN SODIUM (PORCINE) 1000 UNIT/ML DIALYSIS
1000.0000 [IU] | INTRAMUSCULAR | Status: DC | PRN
  Administered 2019-11-02: 13:00:00 2400 [IU] via INTRAVENOUS_CENTRAL
  Filled 2019-10-25 (×3): qty 1

## 2019-10-25 MED ORDER — SODIUM CHLORIDE 0.9 % IV SOLN
100.0000 mL | INTRAVENOUS | Status: DC | PRN
  Administered 2019-10-29: 100 mL via INTRAVENOUS

## 2019-10-25 MED ORDER — LIDOCAINE-PRILOCAINE 2.5-2.5 % EX CREA
1.0000 "application " | TOPICAL_CREAM | CUTANEOUS | Status: DC | PRN
  Filled 2019-10-25: qty 5

## 2019-10-25 MED ORDER — ALTEPLASE 2 MG IJ SOLR
2.0000 mg | Freq: Once | INTRAMUSCULAR | Status: DC | PRN
  Filled 2019-10-25: qty 2

## 2019-10-25 MED ORDER — FUROSEMIDE 10 MG/ML IJ SOLN
160.0000 mg | Freq: Three times a day (TID) | INTRAVENOUS | Status: DC
  Administered 2019-10-25 – 2019-10-28 (×9): 160 mg via INTRAVENOUS
  Filled 2019-10-25 (×3): qty 16
  Filled 2019-10-25: qty 10
  Filled 2019-10-25 (×7): qty 16
  Filled 2019-10-25: qty 10
  Filled 2019-10-25 (×2): qty 16

## 2019-10-25 MED ORDER — SODIUM CHLORIDE 0.9 % IV SOLN: 100.0000 mL | INTRAVENOUS | Status: DC | PRN

## 2019-10-25 MED ORDER — SODIUM CHLORIDE 0.9 % IV SOLN: INTRAVENOUS | Status: DC

## 2019-10-25 NOTE — Progress Notes (Signed)
Patient ID: Chenay Nesmith, female   DOB: January 07, 1946, 73 y.o.   MRN: [id number] S: CRRT stopped 6 pm on 10/24/19.  No events overnight. O:BP 120/61 (BP Location: Right Arm)   Pulse 95   Temp 98.4 F (36.9 C) (Oral)   Resp 16   Ht 5\' 3"  (1.6 m)   Wt 111.3 kg   SpO2 93%   BMI 43.47 kg/m   Intake/Output Summary (Last 24 hours) at 10/25/2019 1337 Last data filed at 10/25/2019 1300 Gross per 24 hour  Intake 1537.63 ml  Output 727 ml  Net 810.63 ml   Intake/Output: I/O last 3 completed shifts: In: 2930.8 [P.O.:1300; I.V.:1121.1; IV Piggyback:509.7] Out: 4204 [ZGYFV:4944]  Intake/Output this shift:  Total I/O In: 421.2 [P.O.:240; I.V.:179.2; IV Piggyback:2] Out: 10 [Urine:10] Weight change: 0.3 kg Physical exam: unable to complete due to COVID + status.  In order to preserve PPE equipment and to minimize exposure to providers.  Notes from other caregivers reviewed  Recent Labs  Lab 10/22/19 0447 10/22/19 1603 10/23/19 0430 10/23/19 0432 10/23/19 1656 10/24/19 0420 10/24/19 1600 10/25/19 0353  NA 133* 134* 134* 132* 135 135 132* 131*  K 4.3 4.3 4.3 4.3 4.6 4.4 4.4 4.3  CL 96* 99 99  --  98 97* 98 96*  CO2 26 27 26   --  26 26 26 25   GLUCOSE 162* 113* 113*  --  151* 87 99 86  BUN 5* 5* 5*  --  6* 7* 7* 12  CREATININE 1.36* 1.58* 1.58*  --  1.57* 1.57* 1.58* 2.44*  ALBUMIN 2.9* 2.9* 2.6*  --  2.7* 3.0* 2.8* 2.8*  CALCIUM 8.7* 8.5* 8.3*  --  8.6* 8.8* 8.7* 8.6*  PHOS 1.6* 2.4* 2.1*  --  3.2 2.1* 2.4* 3.0   Liver Function Tests: Recent Labs  Lab 10/24/19 0420 10/24/19 1600 10/25/19 0353  ALBUMIN 3.0* 2.8* 2.8*   No results for input(s): LIPASE, AMYLASE in the last 168 hours. No results for input(s): AMMONIA in the last 168 hours. CBC: Recent Labs  Lab 10/21/19 0412 10/22/19 0447 10/23/19 0430 10/23/19 0432 10/24/19 0420 10/25/19 0353  WBC 5.9 5.4 5.8  --  6.3 6.9  HGB 8.4* 8.2* 8.1* 9.5* 8.5* 8.3*  HCT 28.1* 27.0* 27.2* 28.0* 29.1* 27.1*  MCV 80.5 80.1  81.2  --  82.4 80.7  PLT 191 183 172  --  167 143*   Cardiac Enzymes: No results for input(s): CKTOTAL, CKMB, CKMBINDEX, TROPONINI in the last 168 hours. CBG: Recent Labs  Lab 10/24/19 0748 10/24/19 1105 10/24/19 1506 10/24/19 2144 10/25/19 0753  GLUCAP 83 95 107* 103* 88    Iron Studies: No results for input(s): IRON, TIBC, TRANSFERRIN, FERRITIN in the last 72 hours. Studies/Results: Dg Chest Port 1 View  Result Date: 10/25/2019 CLINICAL DATA:  COVID-19 pneumonia.  Pleural effusion. EXAM: PORTABLE CHEST 1 VIEW COMPARISON:  10/18/2019 chest radiograph. FINDINGS: Right internal jugular central venous catheter terminates in the middle third of the SVC. Left PICC terminates in the lower third of the SVC. Stable cardiomediastinal silhouette with mild cardiomegaly. No pneumothorax. No pleural effusion. No overt pulmonary edema. No acute consolidative airspace disease. IMPRESSION: 1. No pleural effusions. 2. Stable cardiomegaly without overt pulmonary edema. No active pulmonary disease. Electronically Signed   By: [name] M.D.   On: 10/25/2019 10:12   . allopurinol  300 mg Oral Daily  . atorvastatin  40 mg Oral Daily  . Chlorhexidine Gluconate Cloth  6 each Topical Daily  .  mouth rinse  15 mL Mouth Rinse BID  . multivitamin with minerals  1 tablet Oral Daily  . vitamin C  500 mg Oral Daily  . zinc sulfate  220 mg Oral Daily    BMET    Component Value Date/Time   NA 131 (L) 10/25/2019 0353   K 4.3 10/25/2019 0353   CL 96 (L) 10/25/2019 0353   CO2 25 10/25/2019 0353   GLUCOSE 86 10/25/2019 0353   BUN 12 10/25/2019 0353   CREATININE 2.44 (H) 10/25/2019 0353   CALCIUM 8.6 (L) 10/25/2019 0353   GFRNONAA 19 (L) 10/25/2019 0353   GFRAA 22 (L) 10/25/2019 0353   CBC    Component Value Date/Time   WBC 6.9 10/25/2019 0353   RBC 3.36 (L) 10/25/2019 0353   HGB 8.3 (L) 10/25/2019 0353   HCT 27.1 (L) 10/25/2019 0353   PLT 143 (L) 10/25/2019 0353   MCV 80.7 10/25/2019 0353   MCH  24.7 (L) 10/25/2019 0353   MCHC 30.6 10/25/2019 0353   RDW 22.9 (H) 10/25/2019 0353   LYMPHSABS 1.0 10/18/2019 0439   MONOABS 1.0 10/18/2019 0439   EOSABS 0.0 10/18/2019 0439   BASOSABS 0.0 10/18/2019 0439     Assessment/Plan:  1. AKI/CKD stage 3- presumably due to cardiorenal syndrome.  Failed IV diuretics and started on CVVHD 11/23-11/29.  Stopped yesterday after filter clotted.  Plan to transition to IHD tomorrow if bp remains stable.   2. 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  Cardiology following.  On milrinone gtt. CVP 13.  Will increase lasix to 160mg  tid and see if UOP improves. 3. Valvular heart disease- moderate mitral and tricuspid regurgitation. 4. Persistent A fib- on amiodarone gtt 5. covid-19 infection- per PCCM 6. Anemia of critical illness- transfuse prn.  Given Aranesp on 10/19/19 and will follow for redosing.   Donetta Potts, MD [employer] [phone]

## 2019-10-25 NOTE — Progress Notes (Signed)
ANTICOAGULATION CONSULT NOTE  Pharmacy Consult for Heparin Indication: atrial fibrillation  Patient Measurements: Height: 5\' 3"  (160 cm) Weight: 244 lb 11.4 oz (111 kg) IBW/kg (Calculated) : 52.4 Heparin Dosing Weight: 79.8 kg  Vital Signs: Temp: 98.7 F (37.1 C) (11/30 0400) Temp Source: Oral (11/30 0400) BP: 112/59 (11/30 0400) Pulse Rate: 108 (11/30 0400)  Labs: Recent Labs    10/23/19 0430  10/23/19 0432 10/23/19 0629  10/24/19 0420 10/24/19 1600 10/25/19 0353  HGB 8.1*  --  9.5*  --   --  8.5*  --  8.3*  HCT 27.2*  --  28.0*  --   --  29.1*  --  27.1*  PLT 172  --   --   --   --  167  --  143*  HEPARINUNFRC  --    < >  --  0.31  --  0.34  --  0.31  CREATININE 1.58*  --   --   --    < > 1.57* 1.58* 2.44*   < > = values in this interval not displayed.   Assessment: 73 yr old female presenting with SOB and weight gain - on apixaban PTA for Afib, on hold for now. Last dose of Eliquis was on 11/8.   Pt was told by outside hospital that she required mitral/tricupsid valve replacement > this surgery was deferred upon transfer to [facility]. Now delayed due to incidentally found to be COVID + s/p treatment.  LE dopplers and V/Q scan neg for PE/DVT. Cardiology planning TEE-DCCV this admission.  Heparin level this morning remains therapeutic although at the lower end of goal range at 0.31 on 850 units/hour. Her H&H has been stable, now at 8.3/27.1. Platelets have decreased and are now at 143.  Goal of Therapy:  Heparin level 0.3-0.7 units/ml  Monitor platelets by anticoagulation protocol: Yes  Plan:  - increase heparin to 900 units/hour  - Monitor for signs of bleeding - Check daily heparin level and CBC    Thank you for allowing pharmacy to be a part of this patient's care.  Eddie Candle, PharmD PGY-1 Pharmacy Resident   Please check amion for clinical pharmacist contact number

## 2019-10-25 NOTE — Progress Notes (Signed)
NAME:  Melissa Adams, MRN:  [id number], DOB:  12/31/1945, LOS: 48 ADMISSION DATE:  10/11/2019, CONSULTATION DATE:  11/23 REFERRING MD: Royce Macadamia  , CHIEF COMPLAINT:  Worsening renal failure in setting of biventricular heart failure    Brief History   73 year old female admitted w/ acute biventricular heart failure. EF decreased from 40-45% down to 20-25% (unclear if this is rate related or due to myocarditis from [place]) Incidentally COVID-19 positive.   PCCM asked to see 11/23 for progressive cardio-renal syndrome and need for HD on 11/23  History of present illness   73 year old female w/ hx as mentioned below. Admitted 11/16 w/ cc: weight gain (22 lbs) and worsening shortness of breath. BNP elevated. Admitted w/ working dx of decompensated HF and hyponatremia.  ->incidentally found to be COVID-19 positive.  Started on Remdesivir (steroids stopped as felt mostly HF) and lasix 11/18; Started on milrinone w/ lasix and PICC placed for CVP. 11/20 to ICU for close obs 11/21 got tolvaptan per cards Na 128 felt d/t volume overload 11/22 lasix gtt. Repeated Tolvaptan  11/23 renal fxn worse, still volume overloaded. 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 11/20 to ICU for close obs 11/21 got tolvaptan per cards Na 128 felt d/t volume overload 11/22 lasix gtt. Repeated Tolvaptan  11/23 renal fxn worse, still volume overloaded. PCCM asked to see for HD cath and assist w/ RX 11/24- present, pulling fluid  Consults:  11/16 cards 11/17 renal 11/18 heart failure  11/23 critical care   Procedures:  HD cath 11/23 >   Significant Diagnostic Tests:  11/17 echo:  EF 20-25%, dilated LV, enlarged  RV/LA/RA.  Micro Data:  11/16 and 11/17 COVID19 positive   Antimicrobials:  Remdesivir 11/17-->11/22  Interim history/subjective:  CRRT off last pm.  Oliguric.  C/O intermittent ABD cramping.    Objective   Blood pressure (!) 102/46, pulse 95, temperature 98 F (36.7 C), temperature source Oral, resp. rate 18, height 5\' 3"  (1.6 m), weight 111.3 kg, SpO2 97 %. CVP:  [6 mmHg-17 mmHg] 6 mmHg      Intake/Output Summary (Last 24 hours) at 10/25/2019 0810 Last data filed at 10/25/2019 0700 Gross per 24 hour  Intake 1767.8 ml  Output 1560 ml  Net 207.8 ml   Filed Weights   10/23/19 0435 10/24/19 0428 10/25/19 0500  Weight: 111.3 kg 111 kg 111.3 kg    Examination: General: Well-developed, obese, pleasant lady. NAD  HENT: Normocephalic, PERRL. Moist mucus membranes Neck: No JVD. Trachea midline.  CV: RRR. S1S2. No MRG. +2 distal pulses Lungs: BBS present, clear, FNL, symmetrical ABD: +BS x4. SNT/ND. No masses, guarding or rigidity GU: No Foley EXT: MAE well. 2+ edema to shins  Skin: PWD. In tact. No rashes or lesions Neuro: A&Ox3. CN II-XII in tact. No focal deficits Psych: Appropriate mood, insight and judgment for time and situation     Assessment & Plan:   # Acute on chronic biventricular heart failure w/ EF down to 20-25% (baseline was 40-45%); c/b known mod TR and  MR.- differential includes myocarditis, arrhythmia induced on top of valvular disease. Supplemental O2 overnight.  #  Afib- persistent despite amiodarone, RVR overnight, rate controlled this am # COVID 19 infection- no obvious lung involvement.  S/p remdesivir and steroids. # Cardiorenal syndrome- CRRT stopped 11/29 #ABD cramping. States /p BMs.   - CHF plans to D/c milrinone today. Cont amiodarone per CHF team - Continue Heparin gtt - Lasix IV ordered by CHF. Pt may benefit from cont'd CRRT vs starting iHD. Defer to neph.  - Encourage day/night cycles, OOB, PT to reduce delirium risk - Airborne  isolation - ncourage IS - DCCV and TEE planned for 12/1 - Pepto bismol for ABD cramping   Best practice:  Diet:HF diet  Pain/Anxiety/Delirium protocol (if indicated): NA VAP protocol (if indicated): na DVT prophylaxis: iv HEPARIN  GI prophylaxis: NA Glucose control: D/C SSI as she has not required same. Follow with BMP  Mobility: w/ assist, PT  Code Status: full code  Disposition: ICU pending CRRT and milrinone liberation    The patient is critically ill with BiV failure and renal failure. She requires ICU for high complexity decision making, titration of high alert medications, ventilator management, titration of oxygen and interpretation of advanced monitoring.    I personally spent 45 minutes providing critical care services including personally reviewing test results, discussing care with nursing staff/other physicians and completing orders pertaining to this patient.  Time was exclusive to the patient and does not include time spent teaching or in procedures.  Voice recognition software was used in the production of this record.  Errors in interpretation may have been inadvertently missed during review.  Francine Graven, MSN, AGACNP  [facility]

## 2019-10-25 NOTE — Progress Notes (Signed)
Physical Therapy Treatment Patient Details Name: Melissa Adams MRN: [id number] DOB: September 19, 1946 Today's Date: 10/25/2019    History of Present Illness 73 y.o. female with a medical history of heart failure, atrial fibrillation, hypertension, sleep apnea, who presented to the emergency department with complaints of shortness of breath and weight gain. 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 In ED found to have an elevated BNP with dyspnea on exertion. CXR showed small right pleural effusion with bibasilar atelectasis. Admitted 10/11/19 Cardiorenal syndrome-was on CRRT from 11/23-11/29,    PT Comments    Pt is very relieved to be finished with CRRT, and is eager to get up and walk with therapy. Unfortunately, pt is limited in safe mobility by orthostatic hypotension and bradycardia (see General Comments) in presence of generalized weakness. Pt is min guard for sit>stand transfers, and minA for steadying with 10x marching in place. If medical issues can be resolved d/c home with HHPT would be preferable. PT will continue to follow acutely.    Follow Up Recommendations  Home health PT;Supervision/Assistance - 24 hour     Equipment Recommendations  None recommended by PT       Precautions / Restrictions Precautions Precautions: Fall Precaution Comments: CRRT,  Restrictions Weight Bearing Restrictions: No    Mobility  Bed Mobility               General bed mobility comments: OOB in recliner  Transfers Overall transfer level: Needs assistance Equipment used: None Transfers: Sit to/from Stand Sit to Stand: Min guard         General transfer comment: min guard for safety, good power up, dizziness in standing,         Balance Overall balance assessment: Mild deficits observed, not formally tested                                          Cognition Arousal/Alertness: Awake/alert Behavior During Therapy: WFL for tasks  assessed/performed Overall Cognitive Status: Within Functional Limits for tasks assessed                                        Exercises General Exercises - Upper Extremity Elbow Extension: AROM;Both;10 reps;Seated General Exercises - Lower Extremity Ankle Circles/Pumps: AROM;Both;10 reps;Seated Quad Sets: AROM;Both;10 reps;Seated Gluteal Sets: AROM;Both;10 reps;Seated Long Arc Quad: AROM;Both;10 reps;Seated Hip ABduction/ADduction: AROM;Both;10 reps;Seated Straight Leg Raises: AROM;Both;10 reps;Seated Hip Flexion/Marching: AROM;10 reps;Both Low Level/ICU Exercises Elbow Flexion: AROM;Both;10 reps;Seated Other Exercises Other Exercises: shoulder retraction 10x Other Exercises: crunch forward to unsupported back in seated, x10     General Comments General comments (skin integrity, edema, etc.): SaO2 on RA in mid 90s throughout, pt orthostatic with standing Sitting 129/89, 1st attempt standing 117/59, on 2nd attempt pt not dizzy 10x marching in place, HR dropped to 44-46bpm, BP in sitting  immediately afterward 104/55 with sitting 5 minutes HR in 80-90s and BP 108/55      Pertinent Vitals/Pain Pain Assessment: No/denies pain           PT Goals (current goals can now be found in the care plan section) Acute Rehab PT Goals Patient Stated Goal: go home PT Goal Formulation: With patient Time For Goal Achievement: 10/27/19 Potential to Achieve Goals: Good Progress towards PT goals: Progressing toward goals    Frequency  Min 3X/week      PT Plan Current plan remains appropriate       AM-PAC PT "6 Clicks" Mobility   Outcome Measure  Help needed turning from your back to your side while in a flat bed without using bedrails?: None Help needed moving from lying on your back to sitting on the side of a flat bed without using bedrails?: A Little Help needed moving to and from a bed to a chair (including a wheelchair)?: A Little Help needed standing up  from a chair using your arms (e.g., wheelchair or bedside chair)?: A Little Help needed to walk in hospital room?: A Little Help needed climbing 3-5 steps with a railing? : A Lot 6 Click Score: 18    End of Session   Activity Tolerance: Patient tolerated treatment well Patient left: in bed;with call bell/phone within reach;with nursing/sitter in room(in chair position ) Nurse Communication: Mobility status PT Visit Diagnosis: Unsteadiness on feet (R26.81);Other abnormalities of gait and mobility (R26.89);Muscle weakness (generalized) (M62.81);History of falling (Z91.81);Difficulty in walking, not elsewhere classified (R26.2);Pain Pain - Right/Left: Right Pain - part of body: (flank)     Time: 1445-1510 PT Time Calculation (min) (ACUTE ONLY): 25 min  Charges:  $Gait Training: 8-22 mins $Therapeutic Activity: 8-22 mins                     Kris No B. Migdalia Dk PT, DPT Acute Rehabilitation Services Pager [phone] Office [phone]    [place]in 10/25/2019, 5:25 PM

## 2019-10-25 NOTE — Progress Notes (Signed)
Assisted tele visit to patient with daughter.  Gailene Youkhana M Quinesha Selinger, RN   

## 2019-10-25 NOTE — Progress Notes (Cosign Needed Addendum)
D/W Dr Doristine Bosworth. Hospitalist will assume care 12/1.   PCCM will sign off. Thank you for the opportunity to participate in this patient's care. Please contact if we can be of further assistance. Francine Graven, MSN, AGACNP  [facility]

## 2019-10-25 NOTE — Progress Notes (Signed)
Patient ID: Melissa Adams, female   DOB: 29-Aug-1946, 73 y.o.   MRN: [id number]    Advanced Heart Failure Rounding Note  PCP-Cardiologist: No primary care provider on file.   Subjective:    CVVH started 11/23 and stopped yesterday.  She remains on amiodarone gtt 30 mg/hr, milrinone 0.125 mcg/kg/min.  Co-ox 65%.  CVP 13 this morning.  No UOP listed.   Not short of breath at rest, +orthopnea.   Objective:   Weight Range: 111.3 kg Body mass index is 43.47 kg/m.   Vital Signs:   Temp:  [97.4 F (36.3 C)-98.9 F (37.2 C)] 98 F (36.7 C) (11/30 0800) Pulse Rate:  [86-138] 100 (11/30 0830) Resp:  [14-30] 20 (11/30 0830) BP: (80-133)/(35-117) 114/35 (11/30 0830) SpO2:  [85 %-100 %] 98 % (11/30 0830) Weight:  [111.3 kg] 111.3 kg (11/30 0500) Last BM Date: 10/25/19  Weight change: Filed Weights   10/23/19 0435 10/24/19 0428 10/25/19 0500  Weight: 111.3 kg 111 kg 111.3 kg    Intake/Output:   Intake/Output Summary (Last 24 hours) at 10/25/2019 0837 Last data filed at 10/25/2019 0800 Gross per 24 hour  Intake 1797.26 ml  Output 1560 ml  Net 237.26 ml      Physical Exam    General: NAD Neck: JVP 12 cm, no thyromegaly or thyroid nodule.  Lungs: Clear to auscultation bilaterally with normal respiratory effort. CV: Nondisplaced PMI.  Heart irregular S1/S2, no S3/S4, 2/6 HSM LLSB.  1+ edema to knees bilaterally.  Abdomen: Soft, nontender, no hepatosplenomegaly, no distention.  Skin: Intact without lesions or rashes.  Neurologic: Alert and oriented x 3.  Psych: Normal affect. Extremities: No clubbing or cyanosis.  HEENT: Normal.    Telemetry   Atrial fibrillation rate 100s (personally reviewed).   Labs    CBC Recent Labs    10/24/19 0420 10/25/19 0353  WBC 6.3 6.9  HGB 8.5* 8.3*  HCT 29.1* 27.1*  MCV 82.4 80.7  PLT 167 242*   Basic Metabolic Panel Recent Labs    10/24/19 0420 10/24/19 1600 10/25/19 0353  NA 135 132* 131*  K 4.4 4.4 4.3  CL 97* 98 96*   CO2 26 26 25   GLUCOSE 87 99 86  BUN 7* 7* 12  CREATININE 1.57* 1.58* 2.44*  CALCIUM 8.8* 8.7* 8.6*  MG 2.4  --  2.4  PHOS 2.1* 2.4* 3.0   Liver Function Tests Recent Labs    10/24/19 1600 10/25/19 0353  ALBUMIN 2.8* 2.8*   No results for input(s): LIPASE, AMYLASE in the last 72 hours. Cardiac Enzymes No results for input(s): CKTOTAL, CKMB, CKMBINDEX, TROPONINI in the last 72 hours.  BNP: BNP (last 3 results) Recent Labs    10/11/19 1309  BNP 3,374.7*    ProBNP (last 3 results) No results for input(s): PROBNP in the last 8760 hours.   D-Dimer No results for input(s): DDIMER in the last 72 hours. Hemoglobin A1C No results for input(s): HGBA1C in the last 72 hours. Fasting Lipid Panel No results for input(s): CHOL, HDL, LDLCALC, TRIG, CHOLHDL, LDLDIRECT in the last 72 hours. Thyroid Function Tests No results for input(s): TSH, T4TOTAL, T3FREE, THYROIDAB in the last 72 hours.  Invalid input(s): FREET3  Other results:   Imaging    No results found.   Medications:     Scheduled Medications: . allopurinol  300 mg Oral Daily  . atorvastatin  40 mg Oral Daily  . Chlorhexidine Gluconate Cloth  6 each Topical Daily  . insulin aspart  0-5 Units Subcutaneous QHS  . insulin aspart  0-9 Units Subcutaneous TID WC  . mouth rinse  15 mL Mouth Rinse BID  . multivitamin with minerals  1 tablet Oral Daily  . vitamin C  500 mg Oral Daily  . zinc sulfate  220 mg Oral Daily    Infusions: .  prismasol BGK 4/2.5 300 mL/hr at 10/24/19 1300  .  prismasol BGK 4/2.5 300 mL/hr at 10/24/19 0913  . sodium chloride Stopped (10/25/19 0400)  . amiodarone 30 mg/hr (10/24/19 2154)  . furosemide    . heparin 900 Units/hr (10/25/19 0824)  . prismasol BGK 4/2.5 1,200 mL/hr at 10/24/19 1714    PRN Medications: sodium chloride, acetaminophen, cyclobenzaprine, heparin, ondansetron (ZOFRAN) IV, polyethylene glycol, sodium chloride flush, traMADol, white petrolatum    Assessment/Plan   1. Acute on chronic systolic CHF:  Nonischemic cardiomyopathy, cath in [place] in 9/20 with no significant coronary disease.  Echo with EF 20-25% with dilated and severely dysfunctional RV with D-shaped interventricular septum.  MR appears moderate and TR appears moderate.  With dilated RV, V/Q scan was done and was negative.  Biventricular failure.  Last echo in [place] per report showed EF 40-45%.  Cause of fall in EF uncertain, could be due to atrial fibrillation with RVR (tachycardia-mediated).  Apparently chronic AF but last clinic note from outpatient cardiologist outlines plan for possible atrial fibrillation ablation.  Myocarditis, potentially from COVID-19, is possible.  It is also possible that she has a stress cardiomyopathy due to medical illness/COVID-19 infection.  I have been concerned for low output HF with rise in creatinine and difficulty with diuresis.  Started milrinone at 0.25, HR generally in 100s currently.  Milrinone decreased over weekend to 0.125.  Co-ox has been good on milrinone, 65% today.  With ongoing poor diuresis, CVVH started 11/23 and Lasix gtt stopped.  CVVH stopped yesterday.  CVP 13 today, no UOP recorded.  - Will give Lasix 120 mg IV bid.  I am worried, however, that she will need intermittent HD.  - Stop milrinone today in preparation for DCCV tomorrow.  2. Valvular heart disease: Patient was supposed to have mitral and tricuspid valve replacements this week.  TEE in 9/20 in [place] showed mod-severe MR and severe TR.  On my read of the echo done here, she has moderate MR and moderate TR (both central and likely functional).  Her main issue here seems to be biventricular failure and would hold off on valve procedures for the time being.  - Reassess valves with TEE tomorrow.  3. Atrial fibrillation: Chronic per outpatient notes. Per daughter, she has had cardioversions in the past. There apparently has been consideration for atrial fibrillation ablation.  She remains in RVR but rate better-controlled today (100s this morning).  - Continue amiodarone 30 mg/hr today.  - Continue heparin gtt for now.  - Stop milrinone today, TEE-guided DCCV tomorrow.   4. COVID-19 infection: No PNA on CXR.  On supplemental O2 but this may be due to CHF. Seems to be doing ok from this standpoint. Finished remdesivir.  5. AKI: Per report, baseline creatinine around 1.2, increased up to 3.47.  Suspect primarily cardiorenal. She was volume overloaded and did not diurese adequately, so CVVH started. CVVH stopped 11/29, she is not making much urine.  - As above, concerned that she will need iHD (creatinine rising this morning).  6. Hyponatremia: Hypervolemic hypernatremia.  She got tolvaptan on 11/22.  Improved with CVVH.  Length of Stay: 13  [name], MD  10/25/2019, 8:37 AM  Advanced Heart Failure Team Pager [phone] (M-F; 7a - 4p)  Please contact [facility] for night-coverage after hours (4p -7a ) and weekends on [email or web address]

## 2019-10-26 ENCOUNTER — Inpatient Hospital Stay (HOSPITAL_COMMUNITY): Payer: Medicare Other | Admitting: Certified Registered Nurse Anesthetist

## 2019-10-26 ENCOUNTER — Encounter (HOSPITAL_COMMUNITY)
Admission: EM | Disposition: A | Discharge status: Home Health | Payer: Self-pay | Source: Home / Self Care | Attending: Internal Medicine

## 2019-10-26 ENCOUNTER — Encounter (HOSPITAL_COMMUNITY): Payer: Self-pay | Admitting: *Deleted

## 2019-10-26 ENCOUNTER — Inpatient Hospital Stay (HOSPITAL_COMMUNITY): Payer: Medicare Other

## 2019-10-26 DIAGNOSIS — N179 Acute kidney failure, unspecified: Secondary | ICD-10-CM | POA: Diagnosis not present

## 2019-10-26 DIAGNOSIS — I5023 Acute on chronic systolic (congestive) heart failure: Secondary | ICD-10-CM | POA: Diagnosis not present

## 2019-10-26 DIAGNOSIS — I340 Nonrheumatic mitral (valve) insufficiency: Secondary | ICD-10-CM

## 2019-10-26 DIAGNOSIS — I482 Chronic atrial fibrillation, unspecified: Secondary | ICD-10-CM | POA: Diagnosis not present

## 2019-10-26 DIAGNOSIS — I4891 Unspecified atrial fibrillation: Secondary | ICD-10-CM

## 2019-10-26 HISTORY — PX: TEE WITHOUT CARDIOVERSION: SHX5443

## 2019-10-26 HISTORY — PX: CARDIOVERSION: SHX1299

## 2019-10-26 LAB — PROTIME-INR
INR: 1.2 (ref 0.8–1.2)
Prothrombin Time: 15 seconds (ref 11.4–15.2)

## 2019-10-26 LAB — RENAL FUNCTION PANEL
Albumin: 2.8 g/dL — ABNORMAL LOW (ref 3.5–5.0)
Anion gap: 11 (ref 5–15)
BUN: 16 mg/dL (ref 8–23)
CO2: 22 mmol/L (ref 22–32)
Calcium: 8.8 mg/dL — ABNORMAL LOW (ref 8.9–10.3)
Chloride: 97 mmol/L — ABNORMAL LOW (ref 98–111)
Creatinine, Ser: 3.54 mg/dL — ABNORMAL HIGH (ref 0.44–1.00)
GFR calc Af Amer: 14 mL/min — ABNORMAL LOW (ref >60)
GFR calc non Af Amer: 12 mL/min — ABNORMAL LOW (ref >60)
Glucose, Bld: 80 mg/dL (ref 70–99)
Phosphorus: 3.4 mg/dL (ref 2.5–4.6)
Potassium: 4.3 mmol/L (ref 3.5–5.1)
Sodium: 130 mmol/L — ABNORMAL LOW (ref 135–145)

## 2019-10-26 LAB — CBC
HCT: 29.9 % — ABNORMAL LOW (ref 36.0–46.0)
Hemoglobin: 8.8 g/dL — ABNORMAL LOW (ref 12.0–15.0)
MCH: 24.4 pg — ABNORMAL LOW (ref 26.0–34.0)
MCHC: 29.4 g/dL — ABNORMAL LOW (ref 30.0–36.0)
MCV: 82.8 fL (ref 80.0–100.0)
Platelets: 165 10*3/uL (ref 150–400)
RBC: 3.61 MIL/uL — ABNORMAL LOW (ref 3.87–5.11)
RDW: 23.4 % — ABNORMAL HIGH (ref 11.5–15.5)
WBC: 6.2 10*3/uL (ref 4.0–10.5)
nRBC: 0 % (ref 0.0–0.2)

## 2019-10-26 LAB — COOXEMETRY PANEL
Carboxyhemoglobin: 1.9 % — ABNORMAL HIGH (ref 0.5–1.5)
Methemoglobin: 1 % (ref 0.0–1.5)
O2 Saturation: 75.3 %
Total hemoglobin: 11.2 g/dL — ABNORMAL LOW (ref 12.0–16.0)

## 2019-10-26 LAB — HEPARIN LEVEL (UNFRACTIONATED)
Heparin Unfractionated: 0.19 IU/mL — ABNORMAL LOW (ref 0.30–0.70)
Heparin Unfractionated: 1.12 IU/mL — ABNORMAL HIGH (ref 0.30–0.70)
Heparin Unfractionated: 0.31 IU/mL (ref 0.30–0.70)

## 2019-10-26 LAB — MAGNESIUM: Magnesium: 2.5 mg/dL — ABNORMAL HIGH (ref 1.7–2.4)

## 2019-10-26 LAB — HEPATITIS B SURFACE ANTIGEN: Hepatitis B Surface Ag: NONREACTIVE

## 2019-10-26 LAB — HEPATITIS B CORE ANTIBODY, TOTAL: Hep B Core Total Ab: NONREACTIVE

## 2019-10-26 SURGERY — ECHOCARDIOGRAM, TRANSESOPHAGEAL
Anesthesia: General

## 2019-10-26 MED ORDER — PHENYLEPHRINE HCL-NACL 10-0.9 MG/250ML-% IV SOLN
INTRAVENOUS | Status: DC | PRN
  Administered 2019-10-26: 50 ug/min via INTRAVENOUS

## 2019-10-26 MED ORDER — PROPOFOL 500 MG/50ML IV EMUL
INTRAVENOUS | Status: DC | PRN
  Administered 2019-10-26: 75 ug/kg/min via INTRAVENOUS

## 2019-10-26 MED ORDER — ALLOPURINOL 100 MG PO TABS
200.0000 mg | ORAL_TABLET | Freq: Every day | ORAL | Status: DC
  Administered 2019-10-27 – 2019-11-05 (×9): 200 mg via ORAL
  Filled 2019-10-26 (×11): qty 2

## 2019-10-26 MED ORDER — HEPARIN SODIUM (PORCINE) 1000 UNIT/ML IJ SOLN
2.4000 mL | Freq: Once | INTRAMUSCULAR | Status: AC
  Administered 2019-10-26: 2400 [IU] via INTRAVENOUS

## 2019-10-26 MED ORDER — LIDOCAINE HCL (CARDIAC) PF 100 MG/5ML IV SOSY
PREFILLED_SYRINGE | INTRAVENOUS | Status: DC | PRN
  Administered 2019-10-26: 20 mg via INTRATRACHEAL

## 2019-10-26 MED ORDER — PROPOFOL 10 MG/ML IV BOLUS
INTRAVENOUS | Status: DC | PRN
  Administered 2019-10-26 (×3): 20 mg via INTRAVENOUS

## 2019-10-26 NOTE — Procedures (Signed)
Electrical Cardioversion Procedure Note Tiyona Desouza [id number] Jan 02, 1946  Procedure: Electrical Cardioversion Indications:  Atrial Fibrillation  Procedure Details Consent: Risks of procedure as well as the alternatives and risks of each were explained to the (patient/caregiver).  Consent for procedure obtained. Time Out: Verified patient identification, verified procedure, site/side was marked, verified correct patient position, special equipment/implants available, medications/allergies/relevent history reviewed, required imaging and test results available.  Performed  Patient placed on cardiac monitor, pulse oximetry, supplemental oxygen as necessary.  Sedation given: Propofol per anesthesiology Pacer pads placed anterior and posterior chest.  Cardioverted 3 time(s).  Cardioverted at [place].  Evaluation Findings: Post procedure EKG shows: Atrial Fibrillation Complications: None Patient did tolerate procedure well.  Patient was cardioverted 3 times the 2nd two times with sternal pressure. She had a few sinus beats each time but went back into atrial fibrillation.  Unsuccessful.  I will leave her on amiodarone overnight to see if she will convert overnight, if not, probably will stop amiodarone gtt.    [name] 10/26/2019, 1:22 PM

## 2019-10-26 NOTE — Anesthesia Procedure Notes (Signed)
Procedure Name: MAC Date/Time: 10/26/2019 1:12 PM Performed by: Lavell Luster, CRNA Pre-anesthesia Checklist: Patient identified, Emergency Drugs available, Suction available, Patient being monitored and Timeout performed Patient Re-evaluated:Patient Re-evaluated prior to induction Oxygen Delivery Method: Nasal cannula Preoxygenation: Pre-oxygenation with 100% oxygen Induction Type: IV induction Placement Confirmation: positive ETCO2 and breath sounds checked- equal and bilateral Dental Injury: Teeth and Oropharynx as per pre-operative assessment

## 2019-10-26 NOTE — Progress Notes (Signed)
  Echocardiogram 2D Echocardiogram has been performed.  Melissa Adams M 10/26/2019, 1:24 PM

## 2019-10-26 NOTE — Progress Notes (Signed)
Patient ID: Melissa Adams, female   DOB: 01/05/1946, 73 y.o.   MRN: [id number] S: No events overnight O:BP (!) 81/60   Pulse 72   Temp 97.6 F (36.4 C) (Axillary)   Resp (!) 25   Ht 5\' 3"  (1.6 m)   Wt 109.5 kg   SpO2 94%   BMI 42.78 kg/m   Intake/Output Summary (Last 24 hours) at 10/26/2019 1208 Last data filed at 10/26/2019 1200 Gross per 24 hour  Intake 1292.35 ml  Output 50 ml  Net 1242.35 ml   Intake/Output: I/O last 3 completed shifts: In: 2134.3 [P.O.:640; I.V.:1047.4; IV Piggyback:446.9] Out: 60 [Urine:60]  Intake/Output this shift:  Total I/O In: 183 [I.V.:116.9; IV Piggyback:66] Out: -  Weight change: -1.756 kg Physical exam: unable to complete due to COVID + status.  In order to preserve PPE equipment and to minimize exposure to providers.  Notes from other caregivers reviewed  Recent Labs  Lab 10/22/19 1603 10/23/19 0430 10/23/19 0432 10/23/19 1656 10/24/19 0420 10/24/19 1600 10/25/19 0353 10/26/19 0407  NA 134* 134* 132* 135 135 132* 131* 130*  K 4.3 4.3 4.3 4.6 4.4 4.4 4.3 4.3  CL 99 99  --  98 97* 98 96* 97*  CO2 27 26  --  26 26 26 25 22   GLUCOSE 113* 113*  --  151* 87 99 86 80  BUN 5* 5*  --  6* 7* 7* 12 16  CREATININE 1.58* 1.58*  --  1.57* 1.57* 1.58* 2.44* 3.54*  ALBUMIN 2.9* 2.6*  --  2.7* 3.0* 2.8* 2.8* 2.8*  CALCIUM 8.5* 8.3*  --  8.6* 8.8* 8.7* 8.6* 8.8*  PHOS 2.4* 2.1*  --  3.2 2.1* 2.4* 3.0 3.4   Liver Function Tests: Recent Labs  Lab 10/24/19 1600 10/25/19 0353 10/26/19 0407  ALBUMIN 2.8* 2.8* 2.8*   No results for input(s): LIPASE, AMYLASE in the last 168 hours. No results for input(s): AMMONIA in the last 168 hours. CBC: Recent Labs  Lab 10/22/19 0447 10/23/19 0430  10/24/19 0420 10/25/19 0353 10/26/19 0407  WBC 5.4 5.8  --  6.3 6.9 6.2  HGB 8.2* 8.1*   < > 8.5* 8.3* 8.8*  HCT 27.0* 27.2*   < > 29.1* 27.1* 29.9*  MCV 80.1 81.2  --  82.4 80.7 82.8  PLT 183 172  --  167 143* 165   < > = values in this interval not  displayed.   Cardiac Enzymes: No results for input(s): CKTOTAL, CKMB, CKMBINDEX, TROPONINI in the last 168 hours. CBG: Recent Labs  Lab 10/24/19 0748 10/24/19 1105 10/24/19 1506 10/24/19 2144 10/25/19 0753  GLUCAP 83 95 107* 103* 88    Iron Studies: No results for input(s): IRON, TIBC, TRANSFERRIN, FERRITIN in the last 72 hours. Studies/Results: Dg Chest Port 1 View  Result Date: 10/25/2019 CLINICAL DATA:  COVID-19 pneumonia.  Pleural effusion. EXAM: PORTABLE CHEST 1 VIEW COMPARISON:  10/18/2019 chest radiograph. FINDINGS: Right internal jugular central venous catheter terminates in the middle third of the SVC. Left PICC terminates in the lower third of the SVC. Stable cardiomediastinal silhouette with mild cardiomegaly. No pneumothorax. No pleural effusion. No overt pulmonary edema. No acute consolidative airspace disease. IMPRESSION: 1. No pleural effusions. 2. Stable cardiomegaly without overt pulmonary edema. No active pulmonary disease. Electronically Signed   By: [name] M.D.   On: 10/25/2019 10:12   . allopurinol  300 mg Oral Daily  . atorvastatin  40 mg Oral Daily  . Chlorhexidine Gluconate Cloth  6 each Topical Daily  . Chlorhexidine Gluconate Cloth  6 each Topical Q0600  . mouth rinse  15 mL Mouth Rinse BID  . multivitamin with minerals  1 tablet Oral Daily  . vitamin C  500 mg Oral Daily  . zinc sulfate  220 mg Oral Daily    BMET    Component Value Date/Time   NA 130 (L) 10/26/2019 0407   K 4.3 10/26/2019 0407   CL 97 (L) 10/26/2019 0407   CO2 22 10/26/2019 0407   GLUCOSE 80 10/26/2019 0407   BUN 16 10/26/2019 0407   CREATININE 3.54 (H) 10/26/2019 0407   CALCIUM 8.8 (L) 10/26/2019 0407   GFRNONAA 12 (L) 10/26/2019 0407   GFRAA 14 (L) 10/26/2019 0407   CBC    Component Value Date/Time   WBC 6.2 10/26/2019 0407   RBC 3.61 (L) 10/26/2019 0407   HGB 8.8 (L) 10/26/2019 0407   HCT 29.9 (L) 10/26/2019 0407   PLT 165 10/26/2019 0407   MCV 82.8 10/26/2019  0407   MCH 24.4 (L) 10/26/2019 0407   MCHC 29.4 (L) 10/26/2019 0407   RDW 23.4 (H) 10/26/2019 0407   LYMPHSABS 1.0 10/18/2019 0439   MONOABS 1.0 10/18/2019 0439   EOSABS 0.0 10/18/2019 0439   BASOSABS 0.0 10/18/2019 0439     Assessment/Plan:  1. AKI/CKD stage 3- presumably due to cardiorenal syndrome.  Failed IV diuretics and started on CVVHD 11/23-11/29.  Plan to transition to IHD today if bp remains stable.  If not will need to resume CRRT.   2. 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  Cardiology following.  On milrinone gtt. CVP 12-13.  Did not respond to increase of lasix to 160mg  tid.  Plan for UF with HD as tolerated. 3. Valvular heart disease- moderate mitral and tricuspid regurgitation. 4. Persistent A fib- on amiodarone gtt 5. covid-19 infection- per PCCM 6. Anemia of critical illness- transfuse prn.  Given Aranesp on 10/19/19 and will follow for redosing.   Donetta Potts, MD [employer] [phone]

## 2019-10-26 NOTE — Interval H&P Note (Signed)
History and Physical Interval Note:  10/26/2019 12:35 PM  Melissa Adams  has presented today for surgery, with the diagnosis of afib.  The various methods of treatment have been discussed with the patient and family. After consideration of risks, benefits and other options for treatment, the patient has consented to  Procedure(s): TRANSESOPHAGEAL ECHOCARDIOGRAM (TEE) (N/A) CARDIOVERSION (N/A) as a surgical intervention.  The patient's history has been reviewed, patient examined, no change in status, stable for surgery.  I have reviewed the patient's chart and labs.  Questions were answered to the patient's satisfaction.     Edson Deridder [employer]

## 2019-10-26 NOTE — Transfer of Care (Addendum)
Immediate Anesthesia Transfer of Care Note  Patient: Melissa Adams  Procedure(s) Performed: TRANSESOPHAGEAL ECHOCARDIOGRAM (TEE) (N/A ) CARDIOVERSION (N/A )  Patient Location: PACU and Endoscopy Unit  Anesthesia Type:MAC  Level of Consciousness: awake, alert  and sedated  Airway & Oxygen Therapy: Patient connected to nasal cannula oxygen  Post-op Assessment: Post -op Vital signs reviewed and stable  Post vital signs: stable  Last Vitals:  Vitals Value Taken Time  BP    Temp    Pulse    Resp    SpO2      Last Pain:  Vitals:   10/26/19 1245  TempSrc:   PainSc: 0-No pain      Patients Stated Pain Goal: 0 (47/09/62 [id number])  Complications: No apparent anesthesia complications

## 2019-10-26 NOTE — CV Procedure (Signed)
Procedure: TEE  Sedation: Per anesthesiology.   Indication: Atrial fibrillation.   Findings: Please see echo section for full report.  Moderately dilated left ventricle with normal wall thickness.  EF 20%, diffuse hypokinesis.  No LV apical thrombus was noted.  Moderately dilated RV with moderately decreased systolic function.  Moderate left atrial enlargement, no LA appendage thrombus though "smoke" was noted.  Moderate right atrial enlargement.  No PFO or ASD by color doppler.  There was moderate-severe tricuspid regurgitation, peak RV-RA gradient 46 mmHg.  There was moderate mitral regurgitation, likely functional from annular dilatation.  ERO by PISA only calculated to 0.13 cm^2.  There was flattening of the pulmonary vein systolic doppler signal but not reversal.  Trileaflet aortic valve with no regurgitation or stenosis.  Normal caliber thoracic aorta with minimal plaque.   May proceed to DCCV.   [name] 10/26/2019 1:22 PM

## 2019-10-26 NOTE — Progress Notes (Signed)
Patient ID: Melissa Adams, female   DOB: Mar 03, 1946, 73 y.o.   MRN: [id number]    Advanced Heart Failure Rounding Note  PCP-Cardiologist: No primary care provider on file.   Subjective:    CVVH started 11/23 and stopped 11/29.  She remains on amiodarone gtt 30 mg/hr, now off milrinone.  Co-ox 75%.  CVP 12-13 this morning.  Minimal UOP (50 cc) despite high dose IV Lasix.  Creatinine rising rapidly 2.44 => 3.54.    Not short of breath at rest, +orthopnea.   Objective:   Weight Range: 109.5 kg Body mass index is 42.78 kg/m.   Vital Signs:   Temp:  [98.1 F (36.7 C)-98.7 F (37.1 C)] 98.4 F (36.9 C) (12/01 0400) Pulse Rate:  [63-108] 77 (12/01 0700) Resp:  [14-25] 18 (12/01 0700) BP: (83-151)/(29-132) 120/107 (12/01 0700) SpO2:  [93 %-98 %] 96 % (12/01 0700) Weight:  [109.5 kg] 109.5 kg (12/01 0500) Last BM Date: 10/25/19  Weight change: Filed Weights   10/24/19 0428 10/25/19 0500 10/26/19 0500  Weight: 111 kg 111.3 kg 109.5 kg    Intake/Output:   Intake/Output Summary (Last 24 hours) at 10/26/2019 0825 Last data filed at 10/26/2019 0700 Gross per 24 hour  Intake 1349.04 ml  Output 50 ml  Net 1299.04 ml      Physical Exam    General: NAD Neck: JVP 12+, no thyromegaly or thyroid nodule.  Lungs: Clear to auscultation bilaterally with normal respiratory effort. CV: Nondisplaced PMI.  Heart irregular S1/S2, no S3/S4, 2/6 HSM LLSB. 1+ edema to knees.   Abdomen: Soft, nontender, no hepatosplenomegaly, no distention.  Skin: Intact without lesions or rashes.  Neurologic: Alert and oriented x 3.  Psych: Normal affect. Extremities: No clubbing or cyanosis.  HEENT: Normal.      Telemetry   Atrial fibrillation rate 80s with PVCs (personally reviewed).   Labs    CBC Recent Labs    10/25/19 0353 10/26/19 0407  WBC 6.9 6.2  HGB 8.3* 8.8*  HCT 27.1* 29.9*  MCV 80.7 82.8  PLT 143* 408   Basic Metabolic Panel Recent Labs    10/25/19 0353 10/26/19 0407   NA 131* 130*  K 4.3 4.3  CL 96* 97*  CO2 25 22  GLUCOSE 86 80  BUN 12 16  CREATININE 2.44* 3.54*  CALCIUM 8.6* 8.8*  MG 2.4 2.5*  PHOS 3.0 3.4   Liver Function Tests Recent Labs    10/25/19 0353 10/26/19 0407  ALBUMIN 2.8* 2.8*   No results for input(s): LIPASE, AMYLASE in the last 72 hours. Cardiac Enzymes No results for input(s): CKTOTAL, CKMB, CKMBINDEX, TROPONINI in the last 72 hours.  BNP: BNP (last 3 results) Recent Labs    10/11/19 1309  BNP 3,374.7*    ProBNP (last 3 results) No results for input(s): PROBNP in the last 8760 hours.   D-Dimer No results for input(s): DDIMER in the last 72 hours. Hemoglobin A1C No results for input(s): HGBA1C in the last 72 hours. Fasting Lipid Panel No results for input(s): CHOL, HDL, LDLCALC, TRIG, CHOLHDL, LDLDIRECT in the last 72 hours. Thyroid Function Tests No results for input(s): TSH, T4TOTAL, T3FREE, THYROIDAB in the last 72 hours.  Invalid input(s): FREET3  Other results:   Imaging    Dg Chest Port 1 View  Result Date: 10/25/2019 CLINICAL DATA:  COVID-19 pneumonia.  Pleural effusion. EXAM: PORTABLE CHEST 1 VIEW COMPARISON:  10/18/2019 chest radiograph. FINDINGS: Right internal jugular central venous catheter terminates in the middle third of  the SVC. Left PICC terminates in the lower third of the SVC. Stable cardiomediastinal silhouette with mild cardiomegaly. No pneumothorax. No pleural effusion. No overt pulmonary edema. No acute consolidative airspace disease. IMPRESSION: 1. No pleural effusions. 2. Stable cardiomegaly without overt pulmonary edema. No active pulmonary disease. Electronically Signed   By: [name] M.D.   On: 10/25/2019 10:12     Medications:     Scheduled Medications: . allopurinol  300 mg Oral Daily  . atorvastatin  40 mg Oral Daily  . Chlorhexidine Gluconate Cloth  6 each Topical Daily  . Chlorhexidine Gluconate Cloth  6 each Topical Q0600  . mouth rinse  15 mL Mouth Rinse BID   . multivitamin with minerals  1 tablet Oral Daily  . vitamin C  500 mg Oral Daily  . zinc sulfate  220 mg Oral Daily    Infusions: . sodium chloride Stopped (10/25/19 1258)  . sodium chloride    . sodium chloride    . sodium chloride    . amiodarone 30 mg/hr (10/25/19 2149)  . furosemide Stopped (10/25/19 2326)  . heparin 1,050 Units/hr (10/26/19 0550)    PRN Medications: sodium chloride, sodium chloride, sodium chloride, acetaminophen, alteplase, bismuth subsalicylate, cyclobenzaprine, heparin, heparin, lidocaine (PF), lidocaine-prilocaine, ondansetron (ZOFRAN) IV, pentafluoroprop-tetrafluoroeth, polyethylene glycol, sodium chloride flush, traMADol, white petrolatum   Assessment/Plan   1. Acute on chronic systolic CHF:  Nonischemic cardiomyopathy, cath in [place] in 9/20 with no significant coronary disease.  Echo with EF 20-25% with dilated and severely dysfunctional RV with D-shaped interventricular septum.  MR appears moderate and TR appears moderate.  With dilated RV, V/Q scan was done and was negative.  Biventricular failure.  Last echo in [place] per report showed EF 40-45%.  Cause of fall in EF uncertain, could be due to atrial fibrillation with RVR (tachycardia-mediated).  Apparently chronic AF but last clinic note from outpatient cardiologist outlines plan for possible atrial fibrillation ablation.  Myocarditis, potentially from COVID-19, is possible.  It is also possible that she has a stress cardiomyopathy due to medical illness/COVID-19 infection.  I have been concerned for low output HF with rise in creatinine and difficulty with diuresis.  Started milrinone at 0.25, milrinone now off with co-ox 75%.  With poor diuresis, CVVH started 11/23 and Lasix gtt stopped.  CVVH stopped 11/29.  CVP 12-13 today, minimal UOP with rapid creatinine rise despite Lasix 160 mg IV tid.  - It looks like she is going to need HD going forwards for volume management.  2. Valvular heart disease: Patient  was supposed to have mitral and tricuspid valve replacements this week.  TEE in 9/20 in [place] showed mod-severe MR and severe TR.  On my read of the echo done here, she has moderate MR and moderate TR (both central and likely functional).  Her main issue here seems to be biventricular failure and would hold off on valve procedures for the time being.  - Reassess valves with TEE today.  3. Atrial fibrillation: Chronic per outpatient notes. Per daughter, she has had cardioversions in the past. There apparently has been consideration for atrial fibrillation ablation. HR controlled in 80s off milrinone.   - Continue amiodarone 30 mg/hr today.  - Continue heparin gtt for now.  - TEE-guided DCCV today, have discussed risks/benefits with patient.  4. COVID-19 infection: No PNA on CXR.  On supplemental O2 but this may be due to CHF. Seems to be doing ok from this standpoint. Finished remdesivir.  5. AKI: Per  report, baseline creatinine around 1.2, increased up to 3.47.  Suspect primarily cardiorenal. She was volume overloaded and did not diurese adequately, so CVVH started. CVVH stopped 11/29, she is making minimal urine with rapid rise in creatinine.   - Suspect that she will need HD, nephrology following.   6. Hyponatremia: Hypervolemic hypernatremia.  She got tolvaptan on 11/22.  Improved with CVVH.  Length of Stay: [place], MD  10/26/2019, 8:25 AM  Advanced Heart Failure Team Pager [phone] (M-F; 7a - 4p)  Please contact [facility] for night-coverage after hours (4p -7a ) and weekends on [email or web address]

## 2019-10-26 NOTE — Anesthesia Preprocedure Evaluation (Signed)
Anesthesia Evaluation  Patient identified by MRN, date of birth, ID band Patient awake    Reviewed: Allergy & Precautions, NPO status , Patient's Chart, lab work & pertinent test results  Airway Mallampati: III  TM Distance: >3 FB     Dental   Pulmonary sleep apnea ,  COVID+   breath sounds clear to auscultation       Cardiovascular hypertension, Pt. on medications and Pt. on home beta blockers +CHF   Rhythm:Irregular Rate:Normal  EF 20-25%. Mod MR, Mod TR   Neuro/Psych negative neurological ROS     GI/Hepatic negative GI ROS, Neg liver ROS,   Endo/Other  Morbid obesity  Renal/GU Renal InsufficiencyRenal disease     Musculoskeletal   Abdominal   Peds  Hematology  (+) anemia ,   Anesthesia Other Findings   Reproductive/Obstetrics                             Lab Results  Component Value Date   WBC 6.2 10/26/2019   HGB 8.8 (L) 10/26/2019   HCT 29.9 (L) 10/26/2019   MCV 82.8 10/26/2019   PLT 165 10/26/2019   Lab Results  Component Value Date   CREATININE 3.54 (H) 10/26/2019   BUN 16 10/26/2019   NA 130 (L) 10/26/2019   K 4.3 10/26/2019   CL 97 (L) 10/26/2019   CO2 22 10/26/2019    Anesthesia Physical Anesthesia Plan  ASA: IV  Anesthesia Plan: General   Post-op Pain Management:    Induction: Intravenous  PONV Risk Score and Plan: 2 and Propofol infusion, Ondansetron and Treatment may vary due to age or medical condition  Airway Management Planned: Natural Airway, Nasal Cannula and Mask  Additional Equipment:   Intra-op Plan:   Post-operative Plan:   Informed Consent: I have reviewed the patients History and Physical, chart, labs and discussed the procedure including the risks, benefits and alternatives for the proposed anesthesia with the patient or authorized representative who has indicated his/her understanding and acceptance.       Plan Discussed with:  CRNA  Anesthesia Plan Comments:         Anesthesia Quick Evaluation

## 2019-10-26 NOTE — H&P (View-Only) (Signed)
Patient ID: Melissa Adams, female   DOB: 10-28-1946, 73 y.o.   MRN: [id number]    Advanced Heart Failure Rounding Note  PCP-Cardiologist: No primary care provider on file.   Subjective:    CVVH started 11/23 and stopped 11/29.  She remains on amiodarone gtt 30 mg/hr, now off milrinone.  Co-ox 75%.  CVP 12-13 this morning.  Minimal UOP (50 cc) despite high dose IV Lasix.  Creatinine rising rapidly 2.44 => 3.54.    Not short of breath at rest, +orthopnea.   Objective:   Weight Range: 109.5 kg Body mass index is 42.78 kg/m.   Vital Signs:   Temp:  [98.1 F (36.7 C)-98.7 F (37.1 C)] 98.4 F (36.9 C) (12/01 0400) Pulse Rate:  [63-108] 77 (12/01 0700) Resp:  [14-25] 18 (12/01 0700) BP: (83-151)/(29-132) 120/107 (12/01 0700) SpO2:  [93 %-98 %] 96 % (12/01 0700) Weight:  [109.5 kg] 109.5 kg (12/01 0500) Last BM Date: 10/25/19  Weight change: Filed Weights   10/24/19 0428 10/25/19 0500 10/26/19 0500  Weight: 111 kg 111.3 kg 109.5 kg    Intake/Output:   Intake/Output Summary (Last 24 hours) at 10/26/2019 0825 Last data filed at 10/26/2019 0700 Gross per 24 hour  Intake 1349.04 ml  Output 50 ml  Net 1299.04 ml      Physical Exam    General: NAD Neck: JVP 12+, no thyromegaly or thyroid nodule.  Lungs: Clear to auscultation bilaterally with normal respiratory effort. CV: Nondisplaced PMI.  Heart irregular S1/S2, no S3/S4, 2/6 HSM LLSB. 1+ edema to knees.   Abdomen: Soft, nontender, no hepatosplenomegaly, no distention.  Skin: Intact without lesions or rashes.  Neurologic: Alert and oriented x 3.  Psych: Normal affect. Extremities: No clubbing or cyanosis.  HEENT: Normal.      Telemetry   Atrial fibrillation rate 80s with PVCs (personally reviewed).   Labs    CBC Recent Labs    10/25/19 0353 10/26/19 0407  WBC 6.9 6.2  HGB 8.3* 8.8*  HCT 27.1* 29.9*  MCV 80.7 82.8  PLT 143* 742   Basic Metabolic Panel Recent Labs    10/25/19 0353 10/26/19 0407   NA 131* 130*  K 4.3 4.3  CL 96* 97*  CO2 25 22  GLUCOSE 86 80  BUN 12 16  CREATININE 2.44* 3.54*  CALCIUM 8.6* 8.8*  MG 2.4 2.5*  PHOS 3.0 3.4   Liver Function Tests Recent Labs    10/25/19 0353 10/26/19 0407  ALBUMIN 2.8* 2.8*   No results for input(s): LIPASE, AMYLASE in the last 72 hours. Cardiac Enzymes No results for input(s): CKTOTAL, CKMB, CKMBINDEX, TROPONINI in the last 72 hours.  BNP: BNP (last 3 results) Recent Labs    10/11/19 1309  BNP 3,374.7*    ProBNP (last 3 results) No results for input(s): PROBNP in the last 8760 hours.   D-Dimer No results for input(s): DDIMER in the last 72 hours. Hemoglobin A1C No results for input(s): HGBA1C in the last 72 hours. Fasting Lipid Panel No results for input(s): CHOL, HDL, LDLCALC, TRIG, CHOLHDL, LDLDIRECT in the last 72 hours. Thyroid Function Tests No results for input(s): TSH, T4TOTAL, T3FREE, THYROIDAB in the last 72 hours.  Invalid input(s): FREET3  Other results:   Imaging    Dg Chest Port 1 View  Result Date: 10/25/2019 CLINICAL DATA:  COVID-19 pneumonia.  Pleural effusion. EXAM: PORTABLE CHEST 1 VIEW COMPARISON:  10/18/2019 chest radiograph. FINDINGS: Right internal jugular central venous catheter terminates in the middle third of  the SVC. Left PICC terminates in the lower third of the SVC. Stable cardiomediastinal silhouette with mild cardiomegaly. No pneumothorax. No pleural effusion. No overt pulmonary edema. No acute consolidative airspace disease. IMPRESSION: 1. No pleural effusions. 2. Stable cardiomegaly without overt pulmonary edema. No active pulmonary disease. Electronically Signed   By: [name] M.D.   On: 10/25/2019 10:12     Medications:     Scheduled Medications: . allopurinol  300 mg Oral Daily  . atorvastatin  40 mg Oral Daily  . Chlorhexidine Gluconate Cloth  6 each Topical Daily  . Chlorhexidine Gluconate Cloth  6 each Topical Q0600  . mouth rinse  15 mL Mouth Rinse BID   . multivitamin with minerals  1 tablet Oral Daily  . vitamin C  500 mg Oral Daily  . zinc sulfate  220 mg Oral Daily    Infusions: . sodium chloride Stopped (10/25/19 1258)  . sodium chloride    . sodium chloride    . sodium chloride    . amiodarone 30 mg/hr (10/25/19 2149)  . furosemide Stopped (10/25/19 2326)  . heparin 1,050 Units/hr (10/26/19 0550)    PRN Medications: sodium chloride, sodium chloride, sodium chloride, acetaminophen, alteplase, bismuth subsalicylate, cyclobenzaprine, heparin, heparin, lidocaine (PF), lidocaine-prilocaine, ondansetron (ZOFRAN) IV, pentafluoroprop-tetrafluoroeth, polyethylene glycol, sodium chloride flush, traMADol, white petrolatum   Assessment/Plan   1. Acute on chronic systolic CHF:  Nonischemic cardiomyopathy, cath in [place] in 9/20 with no significant coronary disease.  Echo with EF 20-25% with dilated and severely dysfunctional RV with D-shaped interventricular septum.  MR appears moderate and TR appears moderate.  With dilated RV, V/Q scan was done and was negative.  Biventricular failure.  Last echo in [place] per report showed EF 40-45%.  Cause of fall in EF uncertain, could be due to atrial fibrillation with RVR (tachycardia-mediated).  Apparently chronic AF but last clinic note from outpatient cardiologist outlines plan for possible atrial fibrillation ablation.  Myocarditis, potentially from COVID-19, is possible.  It is also possible that she has a stress cardiomyopathy due to medical illness/COVID-19 infection.  I have been concerned for low output HF with rise in creatinine and difficulty with diuresis.  Started milrinone at 0.25, milrinone now off with co-ox 75%.  With poor diuresis, CVVH started 11/23 and Lasix gtt stopped.  CVVH stopped 11/29.  CVP 12-13 today, minimal UOP with rapid creatinine rise despite Lasix 160 mg IV tid.  - It looks like she is going to need HD going forwards for volume management.  2. Valvular heart disease: Patient  was supposed to have mitral and tricuspid valve replacements this week.  TEE in 9/20 in [place] showed mod-severe MR and severe TR.  On my read of the echo done here, she has moderate MR and moderate TR (both central and likely functional).  Her main issue here seems to be biventricular failure and would hold off on valve procedures for the time being.  - Reassess valves with TEE today.  3. Atrial fibrillation: Chronic per outpatient notes. Per daughter, she has had cardioversions in the past. There apparently has been consideration for atrial fibrillation ablation. HR controlled in 80s off milrinone.   - Continue amiodarone 30 mg/hr today.  - Continue heparin gtt for now.  - TEE-guided DCCV today, have discussed risks/benefits with patient.  4. COVID-19 infection: No PNA on CXR.  On supplemental O2 but this may be due to CHF. Seems to be doing ok from this standpoint. Finished remdesivir.  5. AKI: Per  report, baseline creatinine around 1.2, increased up to 3.47.  Suspect primarily cardiorenal. She was volume overloaded and did not diurese adequately, so CVVH started. CVVH stopped 11/29, she is making minimal urine with rapid rise in creatinine.   - Suspect that she will need HD, nephrology following.   6. Hyponatremia: Hypervolemic hypernatremia.  She got tolvaptan on 11/22.  Improved with CVVH.  Length of Stay: [place], MD  10/26/2019, 8:25 AM  Advanced Heart Failure Team Pager [phone] (M-F; 7a - 4p)  Please contact [facility] for night-coverage after hours (4p -7a ) and weekends on [email or web address]

## 2019-10-26 NOTE — Progress Notes (Signed)
ANTICOAGULATION CONSULT NOTE  Pharmacy Consult for Heparin Indication: atrial fibrillation  Patient Measurements: Height: 5\' 3"  (160 cm) Weight: 234 lb 14.2 oz (106.5 kg) IBW/kg (Calculated) : 52.4 Heparin Dosing Weight: 79.8 kg  Vital Signs: Temp: 97.2 F (36.2 C) (12/01 1530) Temp Source: Oral (12/01 1530) BP: 104/67 (12/01 1700) Pulse Rate: 56 (12/01 1700)  Labs: Recent Labs    10/24/19 0420 10/24/19 1600 10/25/19 0353 10/26/19 0405 10/26/19 0407 10/26/19 0408 10/26/19 1400 10/26/19 1550  HGB 8.5*  --  8.3*  --  8.8*  --   --   --   HCT 29.1*  --  27.1*  --  29.9*  --   --   --   PLT 167  --  143*  --  165  --   --   --   LABPROT  --   --   --  15.0  --   --   --   --   INR  --   --   --  1.2  --   --   --   --   HEPARINUNFRC 0.34  --  0.31  --   --  0.19* 1.12* 0.31  CREATININE 1.57* 1.58* 2.44*  --  3.54*  --   --   --    Assessment: 73 yr old female presenting with SOB and weight gain - on apixaban PTA for Afib, on hold for now. Last dose of Eliquis was on 11/8.   Pt was told by outside hospital that she required mitral/tricupsid valve replacement > this surgery was deferred upon transfer to [facility]. Now delayed due to incidentally found to be COVID + s/p treatment.  LE dopplers and V/Q scan neg for PE/DVT. Cardiology planning TEE-DCCV this admission.  Heparin level now within goal at 0.31 - (lvl of 1.12 likely lab error)  Goal of Therapy:  Heparin level 0.3-0.7 units/ml  Monitor platelets by anticoagulation protocol: Yes  Plan:  Continue heparin 1050 units/hr Daily hep lvl cbc  Barth Kirks, PharmD, BCPS, BCCCP Clinical Pharmacist [phone]  Please check AMION for all [place] numbers  10/26/2019 5:35 PM

## 2019-10-26 NOTE — Progress Notes (Signed)
ANTICOAGULATION CONSULT NOTE  Pharmacy Consult for Heparin Indication: atrial fibrillation  Patient Measurements: Height: 5\' 3"  (160 cm) Weight: 245 lb 6 oz (111.3 kg) IBW/kg (Calculated) : 52.4 Heparin Dosing Weight: 79.8 kg  Vital Signs: Temp: 98.3 F (36.8 C) (11/30 2357) Temp Source: Oral (11/30 2000) BP: 123/29 (12/01 0400) Pulse Rate: 86 (12/01 0400)  Labs: Recent Labs    10/24/19 0420 10/24/19 1600 10/25/19 0353 10/26/19 0407 10/26/19 0408  HGB 8.5*  --  8.3* 8.8*  --   HCT 29.1*  --  27.1* 29.9*  --   PLT 167  --  143* 165  --   HEPARINUNFRC 0.34  --  0.31  --  0.19*  CREATININE 1.57* 1.58* 2.44*  --   --    Assessment: 73 yr old female presenting with SOB and weight gain - on apixaban PTA for Afib, on hold for now. Last dose of Eliquis was on 11/8.   Pt was told by outside hospital that she required mitral/tricupsid valve replacement > this surgery was deferred upon transfer to [facility]. Now delayed due to incidentally found to be COVID + s/p treatment.  LE dopplers and V/Q scan neg for PE/DVT. Cardiology planning TEE-DCCV this admission.  Heparin level down to subtherapeutic (0.19) on gtt at 900 units/hr. Hgb low but stable, plt improved to 165. No issues with line or bleeding reported per RN.  Goal of Therapy:  Heparin level 0.3-0.7 units/ml  Monitor platelets by anticoagulation protocol: Yes  Plan:  - Increase heparin to 1050 units/hour  - F/u 8 hr heparin level   Thank you for allowing pharmacy to be a part of this patient's care.  Sherlon Handing, PharmD, BCPS Please see [email or web address] for complete clinical pharmacist phone list 10/26/2019 5:39 AM

## 2019-10-26 NOTE — Progress Notes (Signed)
PROGRESS NOTE    Melissa Adams  RUE:[id number] DOB: 1946/09/06 DOA: 10/11/2019 PCP: System, Provider Not In      Brief Narrative:  Melissa Adams is a 73 y.o. F with HF previous EF 40-45%, AF, on Eliquis, HTN, OSA on her to severe MR and severe TR, CKD 3, and obesity who presented with 2 to 3 weeks progressive shortness of breath and 22 lb weight gain.  She had tried doubling her Lasix without improvement, then noted to have elevated creatinine, and so was sent to the ER.  In the ER, BNP elevated, creatinine elevated.  Given Lasix and hospitalist service were asked to evaluate for CHF flare.        Assessment & Plan:  Acute on chronic systolic and diastolic CHF Moderate to severe mitral regurgitation and severe tricuspid regurgitation Admitted and started on diuretics.  Creatinine worsened, repeat Echo with EF down to 20-25%, and advanced HF team consulted.  Milrinone was started and Lasix gtt, but patient's renal function continued to worsen and so CRRT started 11/23 to 11/29 and diuresed 12L that way.  Cr up to 3.5 today, UOP minimal in last 24 hours on high dose Lasix. -Consult HF team, appreciate cares -Volume control with dialysis   Cardiorenal syndrome Chronic kidney disease stage III Cr up to 3.5 off CRRT today. UOP minimal. K normal -Consult nephrology, appreciate cares --> plan for IHD session 1 today -Avoid nephrotoxins, hypotension  Hyponatremia Stable, no change from yesterday.  Tolvaptan last given 11/22.  Chronic atrial fibrillation Still in rate controlled Afib.  Cardiology took for TEE today, DCCV unfortunately unsuccessful -Continue heparin -Hold Eliquis -Continue amiodarone gtt  Coronavirus infection Hard to tell if she had a pneumonitis, but the consensus is that she likely did not.  Given the ambiguity however, a conservative approach would be to keep her in isolation for 21 days from positive test on 11/17, until Dec 8th She did however  complete 5 days remdesivir and 3 days dexamethasone.  No further fever. -Continue zinc and vitamin C  Anemia of chronic kidney disease Hgb up to 8.8 from 8.3, no clinical bleeding  OSA -Avoid CPAP until off precautions  Hypertension Blood pressure soft -Hold metoprolol -Continue atorvastatin  Stage II pressure injury right labia, not POA  Gout -Continue allopurinol, reduce dose for renal function             MDM and disposition: The below labs and imaging reports were reviewed and summarized above.  Medication management as above.  The patient was admitted with acute on chronic systolic CHF and respiratory failure.  She remains in severe decompensated CHF.         DVT prophylaxis: N/A on heparin Code Status: FULL Family Communication: Daughter by phone    Consultants:   Cardiology  Nephrology  Procedures:   11/17 renal US -- no hydronephrosis, no medicorenal disease  11/17 VQ scan -- low risk  11/17 echo -- EF 20-25%, TR and MR  11/18 lower extremity US -- no DVT  11/18 PICC placed and milrinone started  11/21 Tolvaptan  11/22 Tolvaptan  11/23 dialyis cather placed CRRT started  11/29 CRRT stopped  11/30 milrinone stopped and transfer OOU  12/1 TEE and DCCV (failed)     Antimicrobials:   Remdesivir 11/17 >> 11/22    Subjective: Very tired, weak, orthopneic, swollen.  Very sluggish.  She has some abdominal discomfort and bloating.  No chest pain.  No dyspnea except with exertion.  Objective: Vitals:  10/26/19 0400 10/26/19 0500 10/26/19 0600 10/26/19 0700  BP: (!) 123/29 94/73 112/69 (!) 120/107  Pulse: 86 83 86 77  Resp: 16 14 15 18   Temp: 98.4 F (36.9 C)     TempSrc: Oral     SpO2: 98% 97% 96% 96%  Weight:  109.5 kg    Height:        Intake/Output Summary (Last 24 hours) at 10/26/2019 4008 Last data filed at 10/26/2019 0700 Gross per 24 hour  Intake 1349.04 ml  Output 50 ml  Net 1299.04 ml   Filed Weights    10/24/19 0428 10/25/19 0500 10/26/19 0500  Weight: 111 kg 111.3 kg 109.5 kg    Examination: General appearance:  adult female, awake but keeps eyes closed during my examination and in moderate distress pain or respiratory discomfort.   HEENT: Anicteric, conjunctiva pink, lids and lashes normal. No nasal deformity, discharge, epistaxis.  Lips moist, oropharynx tacky dry, no oral lesions.   Skin: Warm and dry.   No suspicious rashes or lesions. Cardiac: RRR, nl Q7-Y1, systolic murmur difficult to auscultate with disposable stethoscope.  Capillary refill is brisk.  JVP not visible, sitting straight upright.  3+ LE edema.  Radial pulses 2+ and symmetric. Respiratory: Tachypneic, no accessory muscle use, breath sounds diminished bilaterally, no wheezing  Abdomen: Abdomen soft.  Mild diffuse TTP with voluntary guarding, abdomen appears distended. Neuro: Awake keeps eyes closed.  Oriented to [facility] hospital and the year, but attention appears diminished.  Moves upper extremities with generalized weakness, speech fluent.    Psych: Sensorium intact and responding to questions, but attention is diminished, affect blunted, judgment and insight appear slightly diminished.      Data Reviewed: I have personally reviewed following labs and imaging studies:  CBC: Recent Labs  Lab 10/22/19 0447 10/23/19 0430 10/23/19 0432 10/24/19 0420 10/25/19 0353 10/26/19 0407  WBC 5.4 5.8  --  6.3 6.9 6.2  HGB 8.2* 8.1* 9.5* 8.5* 8.3* 8.8*  HCT 27.0* 27.2* 28.0* 29.1* 27.1* 29.9*  MCV 80.1 81.2  --  82.4 80.7 82.8  PLT 183 172  --  167 143* 950   Basic Metabolic Panel: Recent Labs  Lab 10/22/19 0447  10/23/19 0430  10/23/19 1656 10/24/19 0420 10/24/19 1600 10/25/19 0353 10/26/19 0407  NA 133*   < > 134*   < > 135 135 132* 131* 130*  K 4.3   < > 4.3   < > 4.6 4.4 4.4 4.3 4.3  CL 96*   < > 99  --  98 97* 98 96* 97*  CO2 26   < > 26  --  26 26 26 25 22   GLUCOSE 162*   < > 113*  --  151* 87 99 86 80  BUN  5*   < > 5*  --  6* 7* 7* 12 16  CREATININE 1.36*   < > 1.58*  --  1.57* 1.57* 1.58* 2.44* 3.54*  CALCIUM 8.7*   < > 8.3*  --  8.6* 8.8* 8.7* 8.6* 8.8*  MG 2.6*  --  2.5*  --   --  2.4  --  2.4 2.5*  PHOS 1.6*   < > 2.1*  --  3.2 2.1* 2.4* 3.0 3.4   < > = values in this interval not displayed.   GFR: Estimated Creatinine Clearance: 17.1 mL/min (A) (by C-G formula based on SCr of 3.54 mg/dL (H)). Liver Function Tests: Recent Labs  Lab 10/23/19 1656 10/24/19 0420 10/24/19 1600  10/25/19 0353 10/26/19 0407  ALBUMIN 2.7* 3.0* 2.8* 2.8* 2.8*   No results for input(s): LIPASE, AMYLASE in the last 168 hours. No results for input(s): AMMONIA in the last 168 hours. Coagulation Profile: Recent Labs  Lab 10/26/19 0405  INR 1.2   Cardiac Enzymes: No results for input(s): CKTOTAL, CKMB, CKMBINDEX, TROPONINI in the last 168 hours. BNP (last 3 results) No results for input(s): PROBNP in the last 8760 hours. HbA1C: No results for input(s): HGBA1C in the last 72 hours. CBG: Recent Labs  Lab 10/24/19 0748 10/24/19 1105 10/24/19 1506 10/24/19 2144 10/25/19 0753  GLUCAP 83 95 107* 103* 88   Lipid Profile: No results for input(s): CHOL, HDL, LDLCALC, TRIG, CHOLHDL, LDLDIRECT in the last 72 hours. Thyroid Function Tests: No results for input(s): TSH, T4TOTAL, FREET4, T3FREE, THYROIDAB in the last 72 hours. Anemia Panel: No results for input(s): VITAMINB12, FOLATE, FERRITIN, TIBC, IRON, RETICCTPCT in the last 72 hours. Urine analysis:    Component Value Date/Time   COLORURINE YELLOW 10/12/2019 1723   APPEARANCEUR HAZY (A) 10/12/2019 1723   LABSPEC 1.009 10/12/2019 1723   PHURINE 5.0 10/12/2019 1723   GLUCOSEU NEGATIVE 10/12/2019 1723   HGBUR SMALL (A) 10/12/2019 1723   BILIRUBINUR NEGATIVE 10/12/2019 1723   KETONESUR NEGATIVE 10/12/2019 1723   PROTEINUR NEGATIVE 10/12/2019 1723   UROBILINOGEN 0.2 06/09/2019 1735   NITRITE NEGATIVE 10/12/2019 1723   LEUKOCYTESUR NEGATIVE  10/12/2019 1723   Sepsis Labs: @LABRCNTIP (procalcitonin:4,lacticacidven:4)  )No results found for this or any previous visit (from the past 240 hour(s)).       Radiology Studies: Dg Chest Port 1 View  Result Date: 10/25/2019 CLINICAL DATA:  COVID-19 pneumonia.  Pleural effusion. EXAM: PORTABLE CHEST 1 VIEW COMPARISON:  10/18/2019 chest radiograph. FINDINGS: Right internal jugular central venous catheter terminates in the middle third of the SVC. Left PICC terminates in the lower third of the SVC. Stable cardiomediastinal silhouette with mild cardiomegaly. No pneumothorax. No pleural effusion. No overt pulmonary edema. No acute consolidative airspace disease. IMPRESSION: 1. No pleural effusions. 2. Stable cardiomegaly without overt pulmonary edema. No active pulmonary disease. Electronically Signed   By: [name] M.D.   On: 10/25/2019 10:12        Scheduled Meds: . allopurinol  300 mg Oral Daily  . atorvastatin  40 mg Oral Daily  . Chlorhexidine Gluconate Cloth  6 each Topical Daily  . Chlorhexidine Gluconate Cloth  6 each Topical Q0600  . mouth rinse  15 mL Mouth Rinse BID  . multivitamin with minerals  1 tablet Oral Daily  . vitamin C  500 mg Oral Daily  . zinc sulfate  220 mg Oral Daily   Continuous Infusions: . sodium chloride Stopped (10/25/19 1258)  . sodium chloride    . sodium chloride    . sodium chloride    . amiodarone 30 mg/hr (10/25/19 2149)  . furosemide Stopped (10/25/19 2326)  . heparin 1,050 Units/hr (10/26/19 0550)     LOS: 14 days    Time spent: 35 minutes    Edwin Dada, MD [facility] 10/26/2019, 8:52 AM     Please page though [place] or Epic secure chat:  For [employer], Adult nurse

## 2019-10-27 DIAGNOSIS — I5023 Acute on chronic systolic (congestive) heart failure: Secondary | ICD-10-CM | POA: Diagnosis not present

## 2019-10-27 DIAGNOSIS — N179 Acute kidney failure, unspecified: Secondary | ICD-10-CM | POA: Diagnosis not present

## 2019-10-27 LAB — CBC
HCT: 29.9 % — ABNORMAL LOW (ref 36.0–46.0)
Hemoglobin: 9 g/dL — ABNORMAL LOW (ref 12.0–15.0)
MCH: 24.9 pg — ABNORMAL LOW (ref 26.0–34.0)
MCHC: 30.1 g/dL (ref 30.0–36.0)
MCV: 82.8 fL (ref 80.0–100.0)
Platelets: 137 10*3/uL — ABNORMAL LOW (ref 150–400)
RBC: 3.61 MIL/uL — ABNORMAL LOW (ref 3.87–5.11)
RDW: 23.9 % — ABNORMAL HIGH (ref 11.5–15.5)
WBC: 5.1 10*3/uL (ref 4.0–10.5)
nRBC: 0 % (ref 0.0–0.2)

## 2019-10-27 LAB — MAGNESIUM: Magnesium: 2.4 mg/dL (ref 1.7–2.4)

## 2019-10-27 LAB — HEPATITIS B SURFACE ANTIBODY, QUANTITATIVE: Hep B S AB Quant (Post): <3.1 m[IU]/mL — ABNORMAL LOW (ref >9.9)

## 2019-10-27 LAB — HEPATIC FUNCTION PANEL
ALT: 21 U/L (ref 0–44)
AST: 19 U/L (ref 15–41)
Albumin: 2.6 g/dL — ABNORMAL LOW (ref 3.5–5.0)
Alkaline Phosphatase: 138 U/L — ABNORMAL HIGH (ref 38–126)
Bilirubin, Direct: 0.5 mg/dL — ABNORMAL HIGH (ref 0.0–0.2)
Indirect Bilirubin: 0.5 mg/dL (ref 0.3–0.9)
Total Bilirubin: 1 mg/dL (ref 0.3–1.2)
Total Protein: 5.4 g/dL — ABNORMAL LOW (ref 6.5–8.1)

## 2019-10-27 LAB — RENAL FUNCTION PANEL
Albumin: 2.5 g/dL — ABNORMAL LOW (ref 3.5–5.0)
Anion gap: 12 (ref 5–15)
BUN: 14 mg/dL (ref 8–23)
CO2: 23 mmol/L (ref 22–32)
Calcium: 8.6 mg/dL — ABNORMAL LOW (ref 8.9–10.3)
Chloride: 96 mmol/L — ABNORMAL LOW (ref 98–111)
Creatinine, Ser: 3.21 mg/dL — ABNORMAL HIGH (ref 0.44–1.00)
GFR calc Af Amer: 16 mL/min — ABNORMAL LOW (ref >60)
GFR calc non Af Amer: 14 mL/min — ABNORMAL LOW (ref >60)
Glucose, Bld: 75 mg/dL (ref 70–99)
Phosphorus: 3.4 mg/dL (ref 2.5–4.6)
Potassium: 4.1 mmol/L (ref 3.5–5.1)
Sodium: 131 mmol/L — ABNORMAL LOW (ref 135–145)

## 2019-10-27 LAB — COOXEMETRY PANEL
Carboxyhemoglobin: 2.2 % — ABNORMAL HIGH (ref 0.5–1.5)
Methemoglobin: 1 % (ref 0.0–1.5)
O2 Saturation: 88.6 %
Total hemoglobin: 9.4 g/dL — ABNORMAL LOW (ref 12.0–16.0)

## 2019-10-27 LAB — HEPARIN LEVEL (UNFRACTIONATED): Heparin Unfractionated: 0.45 [IU]/mL (ref 0.30–0.70)

## 2019-10-27 MED ORDER — METOPROLOL SUCCINATE ER 25 MG PO TB24
25.0000 mg | ORAL_TABLET | Freq: Two times a day (BID) | ORAL | Status: DC
  Administered 2019-10-27: 25 mg via ORAL
  Filled 2019-10-27 (×3): qty 1

## 2019-10-27 NOTE — Progress Notes (Signed)
Advanced Heart Failure Rounding Note  PCP-Cardiologist: No primary care provider on file.   Subjective:    CVVH started 11/23 and stopped 11/29.  She remains on amiodarone gtt 30 mg/hr, now off milrinone.  Co-ox ?88%.  CVP 12 this morning.  Poor UOP despite high dose IV Lasix.  She had an HD session yesterday, seems to have tolerated ok.     Not short of breath at rest, +orthopnea.   TEE was done 12/1, showing LV EF 20% with moderate LV dilation, moderately dilated and moderately dysfunctional RV, mod-severe TR, moderate MR.  DCCV was done after but she only maintained NSR for a few beats with each shock and relapsed to atrial fibrillation.   She is in atrial fibrillation, rate 80s-90s this morning.  BP stable.   Objective:   Weight Range: 109.6 kg Body mass index is 42.8 kg/m.   Vital Signs:   Temp:  [97.2 F (36.2 C)-98.3 F (36.8 C)] 97.5 F (36.4 C) (12/02 0746) Pulse Rate:  [38-107] 93 (12/02 0600) Resp:  [12-30] 21 (12/02 0600) BP: (81-134)/(41-109) 134/81 (12/02 0600) SpO2:  [92 %-100 %] 98 % (12/02 0600) Weight:  [106.5 kg-109.6 kg] 109.6 kg (12/02 0400) Last BM Date: 10/26/19  Weight change: Filed Weights   10/26/19 0500 10/26/19 1245 10/27/19 0400  Weight: 109.5 kg 106.5 kg 109.6 kg    Intake/Output:   Intake/Output Summary (Last 24 hours) at 10/27/2019 0849 Last data filed at 10/27/2019 0741 Gross per 24 hour  Intake 701.98 ml  Output 2452 ml  Net -1750.02 ml      Physical Exam    General: NAD Neck: JVP 10 cm, no thyromegaly or thyroid nodule.  Lungs: Clear to auscultation bilaterally with normal respiratory effort. CV: Nondisplaced PMI.  Heart irregular S1/S2, no S3/S4, 2/6 HSM apex.  1+ edema to knees.   Abdomen: Soft, nontender, no hepatosplenomegaly, no distention.  Skin: Intact without lesions or rashes.  Neurologic: Alert and oriented x 3.  Psych: Normal affect. Extremities: No clubbing or cyanosis.  HEENT: Normal.    Telemetry    Atrial fibrillation rate 80s-90s with occasional PVCs (personally reviewed).   Labs    CBC Recent Labs    10/26/19 0407 10/27/19 0402  WBC 6.2 5.1  HGB 8.8* 9.0*  HCT 29.9* 29.9*  MCV 82.8 82.8  PLT 165 536*   Basic Metabolic Panel Recent Labs    10/26/19 0407 10/27/19 0402  NA 130* 131*  K 4.3 4.1  CL 97* 96*  CO2 22 23  GLUCOSE 80 75  BUN 16 14  CREATININE 3.54* 3.21*  CALCIUM 8.8* 8.6*  MG 2.5* 2.4  PHOS 3.4 3.4   Liver Function Tests Recent Labs    10/26/19 0407 10/27/19 0402  AST  --  19  ALT  --  21  ALKPHOS  --  138*  BILITOT  --  1.0  PROT  --  5.4*  ALBUMIN 2.8* 2.5*  2.6*   No results for input(s): LIPASE, AMYLASE in the last 72 hours. Cardiac Enzymes No results for input(s): CKTOTAL, CKMB, CKMBINDEX, TROPONINI in the last 72 hours.  BNP: BNP (last 3 results) Recent Labs    10/11/19 1309  BNP 3,374.7*    ProBNP (last 3 results) No results for input(s): PROBNP in the last 8760 hours.   D-Dimer No results for input(s): DDIMER in the last 72 hours. Hemoglobin A1C No results for input(s): HGBA1C in the last 72 hours. Fasting Lipid Panel No results  for input(s): CHOL, HDL, LDLCALC, TRIG, CHOLHDL, LDLDIRECT in the last 72 hours. Thyroid Function Tests No results for input(s): TSH, T4TOTAL, T3FREE, THYROIDAB in the last 72 hours.  Invalid input(s): FREET3  Other results:   Imaging    No results found.   Medications:     Scheduled Medications: . allopurinol  200 mg Oral Daily  . atorvastatin  40 mg Oral Daily  . Chlorhexidine Gluconate Cloth  6 each Topical Daily  . Chlorhexidine Gluconate Cloth  6 each Topical Q0600  . mouth rinse  15 mL Mouth Rinse BID  . metoprolol succinate  25 mg Oral BID  . multivitamin with minerals  1 tablet Oral Daily  . vitamin C  500 mg Oral Daily  . zinc sulfate  220 mg Oral Daily    Infusions: . sodium chloride Stopped (10/25/19 1258)  . sodium chloride    . sodium chloride    .  furosemide Stopped (10/26/19 2215)  . heparin 1,050 Units/hr (10/27/19 0700)    PRN Medications: sodium chloride, sodium chloride, sodium chloride, acetaminophen, alteplase, bismuth subsalicylate, cyclobenzaprine, heparin, heparin, lidocaine (PF), lidocaine-prilocaine, ondansetron (ZOFRAN) IV, pentafluoroprop-tetrafluoroeth, polyethylene glycol, sodium chloride flush, traMADol, white petrolatum   Assessment/Plan   1. Acute on chronic systolic CHF:  Nonischemic cardiomyopathy, cath in [place] in 9/20 with no significant coronary disease.  Echo with EF 20-25% with dilated and severely dysfunctional RV with D-shaped interventricular septum.  MR appears moderate and TR appears moderate.  With dilated RV, V/Q scan was done and was negative.  Biventricular failure.  Last echo in [place] per report showed EF 40-45%.  Cause of fall in EF uncertain, could be due to atrial fibrillation with RVR (tachycardia-mediated).  Apparently chronic AF but last clinic note from outpatient cardiologist outlines plan for possible atrial fibrillation ablation.  Myocarditis, potentially from COVID-19, is possible.  It is also possible that she has a stress cardiomyopathy due to medical illness/COVID-19 infection.  I have been concerned for low output HF with rise in creatinine and difficulty with diuresis.  Started milrinone at 0.25, milrinone now off with co-ox 75%.  With poor diuresis, CVVH started 11/23 and Lasix gtt stopped.  CVVH stopped 11/29.  CVP 12 today, minimal UOP with rapid creatinine rise despite Lasix 160 mg IV tid, started on intermittent HD yesterday.   - It looks like she is going to need HD going forwards for volume management.  2. Valvular heart disease: Patient was supposed to have mitral and tricuspid valve replacements this week.  TEE in 9/20 in [place] showed mod-severe MR and severe TR.  On my read of the echo done here, she has moderate MR and moderate TR (both central and likely functional).  TEE on 12/1  showed mod-severe TR and moderate MR.  Her main issue here seems to be biventricular failure and would hold off on valve procedures for the time being.   3. Atrial fibrillation: Chronic per outpatient notes. Per daughter, she has had cardioversions in the past. There apparently has been consideration for atrial fibrillation ablation. HR controlled in 80s-90s off milrinone.  She failed DCCV on amiodarone on 12/1. I think she is unlikely to go back into NSR at this point.  - Stop amiodarone, start Toprol XL 25 mg bid for rate control.  - Continue heparin gtt for now, transition to Eliquis eventually.  4. COVID-19 infection: No PNA on CXR.  On supplemental O2 but this may be due to CHF. Seems to be doing ok from this  standpoint. Finished remdesivir.  5. AKI: Per report, baseline creatinine around 1.2, increased up to 3.47.  Suspect primarily cardiorenal. She was volume overloaded and did not diurese adequately, so CVVH started. CVVH stopped 11/29, she is making minimal urine with rapid rise in creatinine.  Intermittent HD started on 12/1.  6. Hyponatremia: Hypervolemic hypernatremia.  She got tolvaptan on 11/22.  Improved with CVVH.  Length of Stay: [place], MD  10/27/2019, 8:49 AM  Advanced Heart Failure Team Pager [phone] (M-F; 7a - 4p)  Please contact [facility] for night-coverage after hours (4p -7a ) and weekends on [email or web address]

## 2019-10-27 NOTE — Progress Notes (Signed)
Physical Therapy Treatment Patient Details Name: Melissa Adams MRN: [id number] DOB: 04-05-1946 Today's Date: 10/27/2019    History of Present Illness 73 y.o. female with a medical history of heart failure, atrial fibrillation, hypertension, sleep apnea, who presented to the emergency department with complaints of shortness of breath and weight gain. 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 In ED found to have an elevated BNP with dyspnea on exertion. CXR showed small right pleural effusion with bibasilar atelectasis. Admitted 10/11/19 Cardiorenal syndrome-was on CRRT from 11/23-11/29,    PT Comments    Pt sitting up in recliner and reports she is no longer dizzy with getting up. Pt request to use BSC prior to ambulation. Pt is limited in safe mobility by increased lines and leads making it difficult to progress her mobility. Pt is currently supervision for transfers and minA for management of lines for ambulation of 30 feet with RW mainly in a circle. Please consider follow up COVID test to determine whether airborne precautions can be lifted and she can progress her mobility. D/c plans remain appropriate at this time. PT will continue to follow acutely.   Follow Up Recommendations  Home health PT;Supervision/Assistance - 24 hour     Equipment Recommendations  None recommended by PT       Precautions / Restrictions Precautions Precautions: Fall Restrictions Weight Bearing Restrictions: No    Mobility  Bed Mobility               General bed mobility comments: OOB in recliner  Transfers Overall transfer level: Needs assistance Equipment used: None Transfers: Sit to/from Stand;Stand Pivot Transfers Sit to Stand: Supervision Stand pivot transfers: Supervision       General transfer comment: supervision for stand pivot to and from [facility] from recliner, and sit>stand  Ambulation/Gait Ambulation/Gait assistance: Min assist Gait Distance (Feet): 30  Feet Assistive device: Rolling walker (2 wheeled) Gait Pattern/deviations: Step-through pattern;Decreased step length - right;Decreased step length - left;Shuffle Gait velocity: slowed Gait velocity interpretation: <1.31 ft/sec, indicative of household ambulator General Gait Details: min A for management of lines and leads, difficult to assess gait deviations due to increased lines and short distance of ambulation prior to turning around, pt with no c/o dizziness or dyspnea       Balance Overall balance assessment: Mild deficits observed, not formally tested                                          Cognition Arousal/Alertness: Awake/alert Behavior During Therapy: WFL for tasks assessed/performed Overall Cognitive Status: Within Functional Limits for tasks assessed                                           General Comments General comments (skin integrity, edema, etc.): continues to have swelling in bilateral LE and feet, HR 75-116bpm, BP stable and SaO2 on RA >90%O2      Pertinent Vitals/Pain Pain Assessment: No/denies pain           PT Goals (current goals can now be found in the care plan section) Acute Rehab PT Goals Patient Stated Goal: go home PT Goal Formulation: With patient Time For Goal Achievement: 10/27/19 Potential to Achieve Goals: Good    Frequency    Min 3X/week  PT Plan Current plan remains appropriate       AM-PAC PT "6 Clicks" Mobility   Outcome Measure  Help needed turning from your back to your side while in a flat bed without using bedrails?: None Help needed moving from lying on your back to sitting on the side of a flat bed without using bedrails?: A Little Help needed moving to and from a bed to a chair (including a wheelchair)?: A Little Help needed standing up from a chair using your arms (e.g., wheelchair or bedside chair)?: A Little Help needed to walk in hospital room?: A Little Help needed  climbing 3-5 steps with a railing? : A Lot 6 Click Score: 18    End of Session   Activity Tolerance: Patient tolerated treatment well Patient left: in bed;with call bell/phone within reach;with nursing/sitter in room(in chair position ) Nurse Communication: Mobility status PT Visit Diagnosis: Unsteadiness on feet (R26.81);Other abnormalities of gait and mobility (R26.89);Muscle weakness (generalized) (M62.81);History of falling (Z91.81);Difficulty in walking, not elsewhere classified (R26.2);Pain Pain - Right/Left: Right Pain - part of body: (flank)     Time: 8563-1497 PT Time Calculation (min) (ACUTE ONLY): 29 min  Charges:  $Gait Training: 8-22 mins $Therapeutic Activity: 8-22 mins                     Lyberti Thrush B. Migdalia Dk PT, DPT Acute Rehabilitation Services Pager [phone] Office [phone]    [place] 10/27/2019, 4:41 PM

## 2019-10-27 NOTE — Progress Notes (Signed)
CSW received consult for patient to assist with POA paperwork. CSW spoke with [place] on 51M to inform her that Spiritual Care needed to be consulted to assist with that matter.  Madilyn Fireman, MSW, LCSW-A Transitions of Care  Clinical Social Worker  [facility] Emergency Departments  Medical ICU [phone]

## 2019-10-27 NOTE — Progress Notes (Signed)
Assisted tele visit to patient with daughter.  Ali Mclaurin Anderson, RN   

## 2019-10-27 NOTE — Progress Notes (Signed)
Patient ID: Melissa Adams, female   DOB: [birth year], 73 y.o.   MRN: [id number] S: No events overnight and tolerated HD yesterday with some drops in BP O:BP 117/66   Pulse (!) 102   Temp (!) 97.5 F (36.4 C) (Oral)   Resp (!) 21   Ht 5\' 3"  (1.6 m)   Wt 109.6 kg   SpO2 98%   BMI 42.80 kg/m   Intake/Output Summary (Last 24 hours) at 10/27/2019 1216 Last data filed at 10/27/2019 0741 Gross per 24 hour  Intake 529.54 ml  Output 2452 ml  Net -1922.46 ml   Intake/Output: I/O last 3 completed shifts: In: 7858 [P.O.:160; I.V.:840.9; IV Piggyback:264] Out: 2500 [Urine:500; Other:2000]  Intake/Output this shift:  Total I/O In: -  Out: 2 [Urine:1; Stool:1] Weight change: -3 kg Gen: sitting up in a chair in NAD Physical exam: unable to complete due to COVID + status.  In order to preserve PPE equipment and to minimize exposure to providers.  Notes from other caregivers reviewed  Recent Labs  Lab 10/23/19 0430 10/23/19 0432 10/23/19 1656 10/24/19 0420 10/24/19 1600 10/25/19 0353 10/26/19 0407 10/27/19 0402  NA 134* 132* 135 135 132* 131* 130* 131*  K 4.3 4.3 4.6 4.4 4.4 4.3 4.3 4.1  CL 99  --  98 97* 98 96* 97* 96*  CO2 26  --  26 26 26 25 22 23   GLUCOSE 113*  --  151* 87 99 86 80 75  BUN 5*  --  6* 7* 7* 12 16 14   CREATININE 1.58*  --  1.57* 1.57* 1.58* 2.44* 3.54* 3.21*  ALBUMIN 2.6*  --  2.7* 3.0* 2.8* 2.8* 2.8* 2.5*  2.6*  CALCIUM 8.3*  --  8.6* 8.8* 8.7* 8.6* 8.8* 8.6*  PHOS 2.1*  --  3.2 2.1* 2.4* 3.0 3.4 3.4  AST  --   --   --   --   --   --   --  19  ALT  --   --   --   --   --   --   --  21   Liver Function Tests: Recent Labs  Lab 10/25/19 0353 10/26/19 0407 10/27/19 0402  AST  --   --  19  ALT  --   --  21  ALKPHOS  --   --  138*  BILITOT  --   --  1.0  PROT  --   --  5.4*  ALBUMIN 2.8* 2.8* 2.5*  2.6*   No results for input(s): LIPASE, AMYLASE in the last 168 hours. No results for input(s): AMMONIA in the last 168 hours. CBC: Recent Labs  Lab  10/23/19 0430  10/24/19 0420 10/25/19 0353 10/26/19 0407 10/27/19 0402  WBC 5.8  --  6.3 6.9 6.2 5.1  HGB 8.1*   < > 8.5* 8.3* 8.8* 9.0*  HCT 27.2*   < > 29.1* 27.1* 29.9* 29.9*  MCV 81.2  --  82.4 80.7 82.8 82.8  PLT 172  --  167 143* 165 137*   < > = values in this interval not displayed.   Cardiac Enzymes: No results for input(s): CKTOTAL, CKMB, CKMBINDEX, TROPONINI in the last 168 hours. CBG: Recent Labs  Lab 10/24/19 0748 10/24/19 1105 10/24/19 1506 10/24/19 2144 10/25/19 0753  GLUCAP 83 95 107* 103* 88    Iron Studies: No results for input(s): IRON, TIBC, TRANSFERRIN, FERRITIN in the last 72 hours. Studies/Results: No results found. Marland Kitchen allopurinol  200 mg Oral Daily  .  atorvastatin  40 mg Oral Daily  . Chlorhexidine Gluconate Cloth  6 each Topical Daily  . Chlorhexidine Gluconate Cloth  6 each Topical Q0600  . mouth rinse  15 mL Mouth Rinse BID  . metoprolol succinate  25 mg Oral BID  . multivitamin with minerals  1 tablet Oral Daily  . vitamin C  500 mg Oral Daily  . zinc sulfate  220 mg Oral Daily    BMET    Component Value Date/Time   NA 131 (L) 10/27/2019 0402   K 4.1 10/27/2019 0402   CL 96 (L) 10/27/2019 0402   CO2 23 10/27/2019 0402   GLUCOSE 75 10/27/2019 0402   BUN 14 10/27/2019 0402   CREATININE 3.21 (H) 10/27/2019 0402   CALCIUM 8.6 (L) 10/27/2019 0402   GFRNONAA 14 (L) 10/27/2019 0402   GFRAA 16 (L) 10/27/2019 0402   CBC    Component Value Date/Time   WBC 5.1 10/27/2019 0402   RBC 3.61 (L) 10/27/2019 0402   HGB 9.0 (L) 10/27/2019 0402   HCT 29.9 (L) 10/27/2019 0402   PLT 137 (L) 10/27/2019 0402   MCV 82.8 10/27/2019 0402   MCH 24.9 (L) 10/27/2019 0402   MCHC 30.1 10/27/2019 0402   RDW 23.9 (H) 10/27/2019 0402   LYMPHSABS 1.0 10/18/2019 0439   MONOABS 1.0 10/18/2019 0439   EOSABS 0.0 10/18/2019 0439   BASOSABS 0.0 10/18/2019 0439   Assessment/Plan:  1. AKI/CKD stage 3- presumably due to cardiorenal syndrome. Failed IV  diuretics and started on CVVHD 11/23-11/29. tolerated IHD on 10/26/19.  Plan for HD again tomorrow and hopefully will tolerate it again.  Hold off on resuming CRRT, however given her biventricular failure and issues with refractory a fib and hypotension, she will be difficult to manage with ongoing HD. 1. She did have 450 cc of urine overnight which is a significant improvement from anuria.  Continue to follow for renal recovery. 2. 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 Cardiology following. On milrinone gtt. CVP 12-13. Did not respond to increase of lasix to 160mg  tid.  Plan for UF with HD as tolerated. 3. Valvular heart disease- moderate mitral and tricuspid regurgitation. 4. Persistent A fib- changed to toprol xl per cardiology.  On heparin 5. Vascular access- will need tunneled HD catheter if no signs of recovering renal function.  RIJ temp cath in place for 9 days. 6. covid-19 infection- per PCCM 7. Anemia of critical illness- transfuse prn. Given Aranesp on 10/19/19 and will follow for redosing.   Donetta Potts, MD [employer] [phone]

## 2019-10-27 NOTE — Progress Notes (Signed)
ANTICOAGULATION CONSULT NOTE  Pharmacy Consult for Heparin Indication: atrial fibrillation  Patient Measurements: Height: 5\' 3"  (160 cm) Weight: 241 lb 10 oz (109.6 kg) IBW/kg (Calculated) : 52.4 Heparin Dosing Weight: 79.8 kg  Vital Signs: Temp: 98.3 F (36.8 C) (12/02 0400) Temp Source: Oral (12/02 0400) BP: 134/81 (12/02 0600) Pulse Rate: 93 (12/02 0600)  Labs: Recent Labs    10/25/19 0353 10/26/19 0405 10/26/19 0407  10/26/19 1400 10/26/19 1550 10/27/19 0402  HGB 8.3*  --  8.8*  --   --   --  9.0*  HCT 27.1*  --  29.9*  --   --   --  29.9*  PLT 143*  --  165  --   --   --  137*  LABPROT  --  15.0  --   --   --   --   --   INR  --  1.2  --   --   --   --   --   HEPARINUNFRC 0.31  --   --    < > 1.12* 0.31 0.45  CREATININE 2.44*  --  3.54*  --   --   --  3.21*   < > = values in this interval not displayed.   Assessment: 73 yr old female presenting with SOB and weight gain - on apixaban PTA for Afib, on hold for now. Last dose of Eliquis was on 11/8.   Pt was told by outside hospital that she required mitral/tricupsid valve replacement > this surgery was deferred upon transfer to [facility]. Now delayed due to incidentally found to be COVID + s/p treatment.  LE dopplers and V/Q scan neg for PE/DVT. Cardiology planning TEE-DCCV this admission.  Heparin level remains within goal at 0.45. Hg low stable, plt down to 137. No bleed issues documented.  Goal of Therapy:  Heparin level 0.3-0.7 units/ml  Monitor platelets by anticoagulation protocol: Yes  Plan:  Continue heparin IV at 1050 units/hr Monitor daily heparin level and CBC, s/sx bleeding   Elicia Lamp, PharmD, BCPS Clinical Pharmacist Please check AMION for all [facility] contact numbers 10/27/2019 7:44 AM

## 2019-10-27 NOTE — Anesthesia Postprocedure Evaluation (Signed)
Anesthesia Post Note  Patient: Melissa Adams  Procedure(s) Performed: TRANSESOPHAGEAL ECHOCARDIOGRAM (TEE) (N/A ) CARDIOVERSION (N/A )     Patient location during evaluation: PACU Anesthesia Type: General Level of consciousness: awake and alert Pain management: pain level controlled Vital Signs Assessment: post-procedure vital signs reviewed and stable Respiratory status: spontaneous breathing, nonlabored ventilation, respiratory function stable and patient connected to nasal cannula oxygen Cardiovascular status: blood pressure returned to baseline and stable Postop Assessment: no apparent nausea or vomiting Anesthetic complications: no    Last Vitals:  Vitals:   10/27/19 0600 10/27/19 0746  BP: 134/81   Pulse: 93   Resp: (!) 21   Temp:  (!) 36.4 C  SpO2: 98%     Last Pain:  Vitals:   10/27/19 0746  TempSrc: Oral  PainSc:                  [name]

## 2019-10-27 NOTE — Progress Notes (Signed)
PROGRESS NOTE    Melissa Adams  HWE:[id number] DOB: Oct 24, 1946 DOA: 10/11/2019 PCP: System, Provider Not In   Brief Narrative: Mrs. Liebert is a 73 y.o. F with HF previous EF 40-45%, AF, on Eliquis, HTN, OSA on her to severe MR and severe TR, CKD 3, and obesity who presented with 2 to 3 weeks progressive shortness of breath and 22 lb weight gain.  She had tried doubling her Lasix without improvement, then noted to have elevated creatinine, and so was sent to the ER.  In the ER, BNP elevated, creatinine elevated.  Given Lasix and hospitalist service were asked to evaluate for CHF flare.   Assessment & Plan:   Active Problems:   Acute CHF (congestive heart failure) (HCC)   Atrial fibrillation, chronic (HCC)   Essential hypertension   Sleep apnea   AKI (acute kidney injury) (HCC)   Gout   Normocytic anemia   CHF exacerbation (HCC)   Dyspnea   COVID-19 virus infection   Pressure injury of skin   Acute on chronic systolic heart failure (HCC)   1-Acute on chronic systolic and diastolic congestive heart failure exacerbation: Moderate to severe mitral regurgitation and severe tricuspid regurgitation:  -Patient was a started on diuretics.  Creatinine worsened, repeat echo with ejection fraction down to 20 to 25%, advanced heart failure team consulted and following. -Patient was a started on midodrine and Lasix drip.  Renal function continued to worsen and patient was a started on CRRT and subsequently hemodialysis. -First hemodialysis on 12/1 -on IV lasix still.  -Off midodrine.   2-cardiorenal syndrome, chronic kidney disease a stage III: Creatinine increased up to 3.5.  Patient started on CRRT and subsequently transition to hemodialysis. Nephrology following.  3-Hyponatremia: Tolvaptan  last dose given on 11/22.  A stable monitor.  4-Chronic A fib;  DCCV was unsuccessful.  On heparin gtt.  Amiodarone discontinue by cardiology on 12-02.  Started on metoprolol 12-02.   5-Coronavirus infection;  ? Pneumonitis.  Needs to remain in isolation for 21 days, from positive test, until Dec 8th.  Completed Remdesivir 5 days and 3 days Dexamethasone.   Anemia of chronic diseases;  Stable.   OSA;  Resume CPAP when off precaution.   HTN; BP has been soft.   Stage II pressure injury right labia Not POA. Local care.   Gout; continue with allopurinol.    Pressure Injury 10/21/19 Labia Right;Medial Stage II -  Partial thickness loss of dermis presenting as a shallow open ulcer with a red, pink wound bed without slough. (Active)  10/21/19 1200  Location: Labia  Location Orientation: Right;Medial  Staging: Stage II -  Partial thickness loss of dermis presenting as a shallow open ulcer with a red, pink wound bed without slough.  Wound Description (Comments):   Present on Admission: No     Estimated body mass index is 42.8 kg/m as calculated from the following:   Height as of this encounter: 5\' 3"  (1.6 m).   Weight as of this encounter: 109.6 kg.   DVT prophylaxis: on Heparin  Code Status: full code Family Communication: will update daughter Disposition Plan: remain in the hospital for treatment of renal faiure and heart failure.  Consultants:   Cardiology  Nephrology   Procedures:   11/17 renal US -- no hydronephrosis, no medicorenal disease  11/17 VQ scan -- low risk  11/17 echo -- EF 20-25%, TR and MR  11/18 lower extremity US -- no DVT  11/18 PICC placed and milrinone started  11/21 Tolvaptan  11/22 Tolvaptan  11/23 dialyis cather placed CRRT started  11/29 CRRT stopped  11/30 milrinone stopped and transfer OOU  12/1 TEE and DCCV (failed)    Antimicrobials:   Remdesivir 11/17 >> 11/22   Subjective: She is sitting recliner, alert, conversant, denies dyspnea.    Objective: Vitals:   10/27/19 0400 10/27/19 0500 10/27/19 0600 10/27/19 0746  BP: 104/61 (!) 99/46 134/81   Pulse: 77 66 93   Resp: 18 15 (!) 21   Temp:  98.3 F (36.8 C)   (!) 97.5 F (36.4 C)  TempSrc: Oral   Oral  SpO2: 96% 94% 98%   Weight: 109.6 kg     Height:        Intake/Output Summary (Last 24 hours) at 10/27/2019 0902 Last data filed at 10/27/2019 0741 Gross per 24 hour  Intake 674.87 ml  Output 2452 ml  Net -1777.13 ml   Filed Weights   10/26/19 0500 10/26/19 1245 10/27/19 0400  Weight: 109.5 kg 106.5 kg 109.6 kg    Examination:  General exam: Appears calm and comfortable  Respiratory system: crackles bases. Marland Kitchen Respiratory effort normal. Cardiovascular system: S1 & S2 heard, RRR.  Gastrointestinal system: Abdomen is nondistended, soft and nontender. No organomegaly or masses felt. Normal bowel sounds heard. Central nervous system: Alert and oriented. No focal neurological deficits. Extremities: Symmetric 5 x 5 power. Skin: No rashes, lesions or ulcers   Data Reviewed: I have personally reviewed following labs and imaging studies  CBC: Recent Labs  Lab 10/23/19 0430 10/23/19 0432 10/24/19 0420 10/25/19 0353 10/26/19 0407 10/27/19 0402  WBC 5.8  --  6.3 6.9 6.2 5.1  HGB 8.1* 9.5* 8.5* 8.3* 8.8* 9.0*  HCT 27.2* 28.0* 29.1* 27.1* 29.9* 29.9*  MCV 81.2  --  82.4 80.7 82.8 82.8  PLT 172  --  167 143* 165 568*   Basic Metabolic Panel: Recent Labs  Lab 10/23/19 0430  10/24/19 0420 10/24/19 1600 10/25/19 0353 10/26/19 0407 10/27/19 0402  NA 134*   < > 135 132* 131* 130* 131*  K 4.3   < > 4.4 4.4 4.3 4.3 4.1  CL 99   < > 97* 98 96* 97* 96*  CO2 26   < > 26 26 25 22 23   GLUCOSE 113*   < > 87 99 86 80 75  BUN 5*   < > 7* 7* 12 16 14   CREATININE 1.58*   < > 1.57* 1.58* 2.44* 3.54* 3.21*  CALCIUM 8.3*   < > 8.8* 8.7* 8.6* 8.8* 8.6*  MG 2.5*  --  2.4  --  2.4 2.5* 2.4  PHOS 2.1*   < > 2.1* 2.4* 3.0 3.4 3.4   < > = values in this interval not displayed.   GFR: Estimated Creatinine Clearance: 18.8 mL/min (A) (by C-G formula based on SCr of 3.21 mg/dL (H)). Liver Function Tests: Recent Labs  Lab 10/24/19  0420 10/24/19 1600 10/25/19 0353 10/26/19 0407 10/27/19 0402  AST  --   --   --   --  19  ALT  --   --   --   --  21  ALKPHOS  --   --   --   --  138*  BILITOT  --   --   --   --  1.0  PROT  --   --   --   --  5.4*  ALBUMIN 3.0* 2.8* 2.8* 2.8* 2.5*  2.6*   No results  for input(s): LIPASE, AMYLASE in the last 168 hours. No results for input(s): AMMONIA in the last 168 hours. Coagulation Profile: Recent Labs  Lab 10/26/19 0405  INR 1.2   Cardiac Enzymes: No results for input(s): CKTOTAL, CKMB, CKMBINDEX, TROPONINI in the last 168 hours. BNP (last 3 results) No results for input(s): PROBNP in the last 8760 hours. HbA1C: No results for input(s): HGBA1C in the last 72 hours. CBG: Recent Labs  Lab 10/24/19 0748 10/24/19 1105 10/24/19 1506 10/24/19 2144 10/25/19 0753  GLUCAP 83 95 107* 103* 88   Lipid Profile: No results for input(s): CHOL, HDL, LDLCALC, TRIG, CHOLHDL, LDLDIRECT in the last 72 hours. Thyroid Function Tests: No results for input(s): TSH, T4TOTAL, FREET4, T3FREE, THYROIDAB in the last 72 hours. Anemia Panel: No results for input(s): VITAMINB12, FOLATE, FERRITIN, TIBC, IRON, RETICCTPCT in the last 72 hours. Sepsis Labs: No results for input(s): PROCALCITON, LATICACIDVEN in the last 168 hours.  No results found for this or any previous visit (from the past 240 hour(s)).       Radiology Studies: Dg Chest Port 1 View  Result Date: 10/25/2019 CLINICAL DATA:  COVID-19 pneumonia.  Pleural effusion. EXAM: PORTABLE CHEST 1 VIEW COMPARISON:  10/18/2019 chest radiograph. FINDINGS: Right internal jugular central venous catheter terminates in the middle third of the SVC. Left PICC terminates in the lower third of the SVC. Stable cardiomediastinal silhouette with mild cardiomegaly. No pneumothorax. No pleural effusion. No overt pulmonary edema. No acute consolidative airspace disease. IMPRESSION: 1. No pleural effusions. 2. Stable cardiomegaly without overt  pulmonary edema. No active pulmonary disease. Electronically Signed   By: [name] M.D.   On: 10/25/2019 10:12        Scheduled Meds: . allopurinol  200 mg Oral Daily  . atorvastatin  40 mg Oral Daily  . Chlorhexidine Gluconate Cloth  6 each Topical Daily  . Chlorhexidine Gluconate Cloth  6 each Topical Q0600  . mouth rinse  15 mL Mouth Rinse BID  . metoprolol succinate  25 mg Oral BID  . multivitamin with minerals  1 tablet Oral Daily  . vitamin C  500 mg Oral Daily  . zinc sulfate  220 mg Oral Daily   Continuous Infusions: . sodium chloride Stopped (10/25/19 1258)  . sodium chloride    . sodium chloride    . furosemide Stopped (10/26/19 2215)  . heparin 1,050 Units/hr (10/27/19 0700)     LOS: 15 days    Time spent: 35 minutes.     Elmarie Shiley, MD [facility]   If 7PM-7AM, please contact night-coverage [email or web address] Password [email or web address] 10/27/2019, 9:02 AM

## 2019-10-28 DIAGNOSIS — N179 Acute kidney failure, unspecified: Secondary | ICD-10-CM | POA: Diagnosis not present

## 2019-10-28 DIAGNOSIS — I509 Heart failure, unspecified: Secondary | ICD-10-CM | POA: Diagnosis not present

## 2019-10-28 DIAGNOSIS — R0600 Dyspnea, unspecified: Secondary | ICD-10-CM

## 2019-10-28 LAB — CBC
HCT: 30.3 % — ABNORMAL LOW (ref 36.0–46.0)
Hemoglobin: 9 g/dL — ABNORMAL LOW (ref 12.0–15.0)
MCH: 24.5 pg — ABNORMAL LOW (ref 26.0–34.0)
MCHC: 29.7 g/dL — ABNORMAL LOW (ref 30.0–36.0)
MCV: 82.3 fL (ref 80.0–100.0)
Platelets: 171 10*3/uL (ref 150–400)
RBC: 3.68 MIL/uL — ABNORMAL LOW (ref 3.87–5.11)
RDW: 24.2 % — ABNORMAL HIGH (ref 11.5–15.5)
WBC: 5.7 10*3/uL (ref 4.0–10.5)
nRBC: 0 % (ref 0.0–0.2)

## 2019-10-28 LAB — RENAL FUNCTION PANEL
Albumin: 2.8 g/dL — ABNORMAL LOW (ref 3.5–5.0)
Anion gap: 11 (ref 5–15)
BUN: 23 mg/dL (ref 8–23)
CO2: 26 mmol/L (ref 22–32)
Calcium: 9.1 mg/dL (ref 8.9–10.3)
Chloride: 95 mmol/L — ABNORMAL LOW (ref 98–111)
Creatinine, Ser: 4.52 mg/dL — ABNORMAL HIGH (ref 0.44–1.00)
GFR calc Af Amer: 11 mL/min — ABNORMAL LOW (ref >60)
GFR calc non Af Amer: 9 mL/min — ABNORMAL LOW (ref >60)
Glucose, Bld: 79 mg/dL (ref 70–99)
Phosphorus: 3.6 mg/dL (ref 2.5–4.6)
Potassium: 4 mmol/L (ref 3.5–5.1)
Sodium: 132 mmol/L — ABNORMAL LOW (ref 135–145)

## 2019-10-28 LAB — MAGNESIUM: Magnesium: 2.3 mg/dL (ref 1.7–2.4)

## 2019-10-28 LAB — HEPARIN LEVEL (UNFRACTIONATED): Heparin Unfractionated: 0.56 IU/mL (ref 0.30–0.70)

## 2019-10-28 MED ORDER — HEPARIN SODIUM (PORCINE) 1000 UNIT/ML IJ SOLN
INTRAMUSCULAR | Status: AC
  Filled 2019-10-28: qty 3

## 2019-10-28 NOTE — Progress Notes (Signed)
PROGRESS NOTE    Melissa Adams  FTD:[id number] DOB: 1946/09/04 DOA: 10/11/2019 PCP: System, Provider Not In   Brief Narrative: Melissa Adams is a 73 y.o. F with HF previous EF 40-45%, AF, on Eliquis, HTN, OSA on her to severe MR and severe TR, CKD 3, and obesity who presented with 2 to 3 weeks progressive shortness of breath and 22 lb weight gain.  She had tried doubling her Lasix without improvement, then noted to have elevated creatinine, and so was sent to the ER.  In the ER, BNP elevated, creatinine elevated.  Given Lasix and hospitalist service were asked to evaluate for CHF flare.   Assessment & Plan:   Active Problems:   Acute CHF (congestive heart failure) (HCC)   Atrial fibrillation, chronic (HCC)   Essential hypertension   Sleep apnea   AKI (acute kidney injury) (HCC)   Gout   Normocytic anemia   CHF exacerbation (HCC)   Dyspnea   COVID-19 virus infection   Pressure injury of skin   Acute on chronic systolic heart failure (HCC)   1-Acute on chronic systolic and diastolic congestive heart failure exacerbation: Moderate to severe mitral regurgitation and severe tricuspid regurgitation:  -Patient was a started on diuretics.  Creatinine worsened, repeat echo with ejection fraction down to 20 to 25%, advanced heart failure team consulted and following. -Patient was a started on midodrine and Lasix drip.  Renal function continued to worsen and patient was a started on CRRT and subsequently hemodialysis. -First hemodialysis on 12/1 -discussed with nephrology will stop lasix.  -Off midodrine.   2-Cardiorenal syndrome, chronic kidney disease a stage III: Creatinine increased up to 3.5.  Patient started on CRRT and subsequently transition to hemodialysis. Nephrology following. Had HD 12-03. Tolerated well.   3-Hyponatremia: Tolvaptan  last dose given on 11/22.  A stable monitor.  4-Chronic A fib;  DCCV was unsuccessful.  On heparin gtt.  Amiodarone discontinue by  cardiology on 12-02.  Started on metoprolol 12-02. She was only able to received metoprolol yesterday once because BP was low. Will defer to cardiology.   5-Coronavirus infection;  ? Pneumonitis.  Needs to remain in isolation for 21 days, from positive test, until Dec 8th.  Completed Remdesivir 5 days and 3 days Dexamethasone.   Anemia of chronic diseases;  Stable.   OSA;  Resume CPAP when off precaution.   HTN; BP has been soft.   Stage II pressure injury right labia Not POA. Local care.   Gout; continue with allopurinol.    Pressure Injury 10/21/19 Labia Right;Medial Stage II -  Partial thickness loss of dermis presenting as a shallow open ulcer with a red, pink wound bed without slough. (Active)  10/21/19 1200  Location: Labia  Location Orientation: Right;Medial  Staging: Stage II -  Partial thickness loss of dermis presenting as a shallow open ulcer with a red, pink wound bed without slough.  Wound Description (Comments):   Present on Admission: No     Estimated body mass index is 42.8 kg/m as calculated from the following:   Height as of this encounter: 5\' 3"  (1.6 m).   Weight as of this encounter: 109.6 kg.   DVT prophylaxis: on Heparin  Code Status: full code Family Communication: Daughter updated.  Disposition Plan: remain in the hospital for treatment of renal faiure and heart failure.  Consultants:   Cardiology  Nephrology   Procedures:   11/17 renal US -- no hydronephrosis, no medicorenal disease  11/17 VQ scan -- low  risk  11/17 echo -- EF 20-25%, TR and MR  11/18 lower extremity US -- no DVT  11/18 PICC placed and milrinone started  11/21 Tolvaptan  11/22 Tolvaptan  11/23 dialyis cather placed CRRT started  11/29 CRRT stopped  11/30 milrinone stopped and transfer OOU  12/1 TEE and DCCV (failed)    Antimicrobials:   Remdesivir 11/17 >> 11/22   Subjective: I saw patient on HD unit, she was doing well. She denies dyspnea.    Objective: Vitals:   10/28/19 0800 10/28/19 1400 10/28/19 1450 10/28/19 1500  BP:  100/85  (!) 105/95  Pulse: 68 62 (!) 117 72  Resp: 13 (!) 26 17 (!) 21  Temp:  98.1 F (36.7 C)    TempSrc:  Oral    SpO2: 96% 92% 100% 95%  Weight:      Height:        Intake/Output Summary (Last 24 hours) at 10/28/2019 1635 Last data filed at 10/28/2019 1428 Gross per 24 hour  Intake 410.2 ml  Output 200 ml  Net 210.2 ml   Filed Weights   10/26/19 0500 10/26/19 1245 10/27/19 0400  Weight: 109.5 kg 106.5 kg 109.6 kg    Examination:  General exam: NAD Respiratory system: Crackles base Cardiovascular system: S 1, S 2 RRR Gastrointestinal system: BS present, soft, nt Central nervous system: Alert,  following command Extremities: Symmetric power.  Skin: no rashes.    Data Reviewed: I have personally reviewed following labs and imaging studies  CBC: Recent Labs  Lab 10/24/19 0420 10/25/19 0353 10/26/19 0407 10/27/19 0402 10/28/19 0716  WBC 6.3 6.9 6.2 5.1 5.7  HGB 8.5* 8.3* 8.8* 9.0* 9.0*  HCT 29.1* 27.1* 29.9* 29.9* 30.3*  MCV 82.4 80.7 82.8 82.8 82.3  PLT 167 143* 165 137* 326   Basic Metabolic Panel: Recent Labs  Lab 10/24/19 0420 10/24/19 1600 10/25/19 0353 10/26/19 0407 10/27/19 0402 10/28/19 0716  NA 135 132* 131* 130* 131* 132*  K 4.4 4.4 4.3 4.3 4.1 4.0  CL 97* 98 96* 97* 96* 95*  CO2 26 26 25 22 23 26   GLUCOSE 87 99 86 80 75 79  BUN 7* 7* 12 16 14 23   CREATININE 1.57* 1.58* 2.44* 3.54* 3.21* 4.52*  CALCIUM 8.8* 8.7* 8.6* 8.8* 8.6* 9.1  MG 2.4  --  2.4 2.5* 2.4 2.3  PHOS 2.1* 2.4* 3.0 3.4 3.4 3.6   GFR: Estimated Creatinine Clearance: 13.4 mL/min (A) (by C-G formula based on SCr of 4.52 mg/dL (H)). Liver Function Tests: Recent Labs  Lab 10/24/19 1600 10/25/19 0353 10/26/19 0407 10/27/19 0402 10/28/19 0716  AST  --   --   --  19  --   ALT  --   --   --  21  --   ALKPHOS  --   --   --  138*  --   BILITOT  --   --   --  1.0  --   PROT  --   --   --   5.4*  --   ALBUMIN 2.8* 2.8* 2.8* 2.5*  2.6* 2.8*   No results for input(s): LIPASE, AMYLASE in the last 168 hours. No results for input(s): AMMONIA in the last 168 hours. Coagulation Profile: Recent Labs  Lab 10/26/19 0405  INR 1.2   Cardiac Enzymes: No results for input(s): CKTOTAL, CKMB, CKMBINDEX, TROPONINI in the last 168 hours. BNP (last 3 results) No results for input(s): PROBNP in the last 8760 hours. HbA1C: No results  for input(s): HGBA1C in the last 72 hours. CBG: Recent Labs  Lab 10/24/19 0748 10/24/19 1105 10/24/19 1506 10/24/19 2144 10/25/19 0753  GLUCAP 83 95 107* 103* 88   Lipid Profile: No results for input(s): CHOL, HDL, LDLCALC, TRIG, CHOLHDL, LDLDIRECT in the last 72 hours. Thyroid Function Tests: No results for input(s): TSH, T4TOTAL, FREET4, T3FREE, THYROIDAB in the last 72 hours. Anemia Panel: No results for input(s): VITAMINB12, FOLATE, FERRITIN, TIBC, IRON, RETICCTPCT in the last 72 hours. Sepsis Labs: No results for input(s): PROCALCITON, LATICACIDVEN in the last 168 hours.  No results found for this or any previous visit (from the past 240 hour(s)).       Radiology Studies: No results found.      Scheduled Meds: . allopurinol  200 mg Oral Daily  . atorvastatin  40 mg Oral Daily  . Chlorhexidine Gluconate Cloth  6 each Topical Daily  . Chlorhexidine Gluconate Cloth  6 each Topical Q0600  . heparin      . mouth rinse  15 mL Mouth Rinse BID  . metoprolol succinate  25 mg Oral BID  . multivitamin with minerals  1 tablet Oral Daily  . vitamin C  500 mg Oral Daily  . zinc sulfate  220 mg Oral Daily   Continuous Infusions: . sodium chloride Stopped (10/25/19 1258)  . sodium chloride    . sodium chloride    . furosemide 160 mg (10/28/19 1428)  . heparin 1,050 Units/hr (10/28/19 0200)     LOS: 16 days    Time spent: 35 minutes.     Elmarie Shiley, MD [facility]   If 7PM-7AM, please contact night-coverage  [email or web address] Password [facility] 10/28/2019, 4:35 PM

## 2019-10-28 NOTE — Progress Notes (Signed)
Patient ID: Melissa Adams, female   DOB: May 22, 1946, 73 y.o.   MRN: [id number] S: No events overnight O:BP 120/76 (BP Location: Left Leg)   Pulse 74   Temp 98.4 F (36.9 C) (Oral)   Resp 19   Ht 5\' 3"  (1.6 m)   Wt 109.6 kg   SpO2 98%   BMI 42.80 kg/m   Intake/Output Summary (Last 24 hours) at 10/28/2019 1308 Last data filed at 10/28/2019 0200 Gross per 24 hour  Intake 265.67 ml  Output 200 ml  Net 65.67 ml   Intake/Output: I/O last 3 completed shifts: In: 796.8 [I.V.:559.1; IV Piggyback:237.7] Out: 2502 [Urine:501; Other:2000; Stool:1]  Intake/Output this shift:  No intake/output data recorded. Weight change:  Physical exam: unable to complete due to COVID + status.  In order to preserve PPE equipment and to minimize exposure to providers.  Notes from other caregivers reviewed   Recent Labs  Lab 10/23/19 1656 10/24/19 0420 10/24/19 1600 10/25/19 0353 10/26/19 0407 10/27/19 0402 10/28/19 0716  NA 135 135 132* 131* 130* 131* 132*  K 4.6 4.4 4.4 4.3 4.3 4.1 4.0  CL 98 97* 98 96* 97* 96* 95*  CO2 26 26 26 25 22 23 26   GLUCOSE 151* 87 99 86 80 75 79  BUN 6* 7* 7* 12 16 14 23   CREATININE 1.57* 1.57* 1.58* 2.44* 3.54* 3.21* 4.52*  ALBUMIN 2.7* 3.0* 2.8* 2.8* 2.8* 2.5*  2.6* 2.8*  CALCIUM 8.6* 8.8* 8.7* 8.6* 8.8* 8.6* 9.1  PHOS 3.2 2.1* 2.4* 3.0 3.4 3.4 3.6  AST  --   --   --   --   --  19  --   ALT  --   --   --   --   --  21  --    Liver Function Tests: Recent Labs  Lab 10/26/19 0407 10/27/19 0402 10/28/19 0716  AST  --  19  --   ALT  --  21  --   ALKPHOS  --  138*  --   BILITOT  --  1.0  --   PROT  --  5.4*  --   ALBUMIN 2.8* 2.5*  2.6* 2.8*   No results for input(s): LIPASE, AMYLASE in the last 168 hours. No results for input(s): AMMONIA in the last 168 hours. CBC: Recent Labs  Lab 10/24/19 0420 10/25/19 0353 10/26/19 0407 10/27/19 0402 10/28/19 0716  WBC 6.3 6.9 6.2 5.1 5.7  HGB 8.5* 8.3* 8.8* 9.0* 9.0*  HCT 29.1* 27.1* 29.9* 29.9* 30.3*   MCV 82.4 80.7 82.8 82.8 82.3  PLT 167 143* 165 137* 171   Cardiac Enzymes: No results for input(s): CKTOTAL, CKMB, CKMBINDEX, TROPONINI in the last 168 hours. CBG: Recent Labs  Lab 10/24/19 0748 10/24/19 1105 10/24/19 1506 10/24/19 2144 10/25/19 0753  GLUCAP 83 95 107* 103* 88    Iron Studies: No results for input(s): IRON, TIBC, TRANSFERRIN, FERRITIN in the last 72 hours. Studies/Results: No results found. Marland Kitchen allopurinol  200 mg Oral Daily  . atorvastatin  40 mg Oral Daily  . Chlorhexidine Gluconate Cloth  6 each Topical Daily  . Chlorhexidine Gluconate Cloth  6 each Topical Q0600  . heparin      . mouth rinse  15 mL Mouth Rinse BID  . metoprolol succinate  25 mg Oral BID  . multivitamin with minerals  1 tablet Oral Daily  . vitamin C  500 mg Oral Daily  . zinc sulfate  220 mg Oral Daily  BMET    Component Value Date/Time   NA 132 (L) 10/28/2019 0716   K 4.0 10/28/2019 0716   CL 95 (L) 10/28/2019 0716   CO2 26 10/28/2019 0716   GLUCOSE 79 10/28/2019 0716   BUN 23 10/28/2019 0716   CREATININE 4.52 (H) 10/28/2019 0716   CALCIUM 9.1 10/28/2019 0716   GFRNONAA 9 (L) 10/28/2019 0716   GFRAA 11 (L) 10/28/2019 0716   CBC    Component Value Date/Time   WBC 5.7 10/28/2019 0716   RBC 3.68 (L) 10/28/2019 0716   HGB 9.0 (L) 10/28/2019 0716   HCT 30.3 (L) 10/28/2019 0716   PLT 171 10/28/2019 0716   MCV 82.3 10/28/2019 0716   MCH 24.5 (L) 10/28/2019 0716   MCHC 29.7 (L) 10/28/2019 0716   RDW 24.2 (H) 10/28/2019 0716   LYMPHSABS 1.0 10/18/2019 0439   MONOABS 1.0 10/18/2019 0439   EOSABS 0.0 10/18/2019 0439   BASOSABS 0.0 10/18/2019 0439   Assessment/Plan:  1. AKI/CKD stage 3- presumably due to cardiorenal syndrome. Failed IV diuretics and started on CVVHD 11/23-11/29. tolerated IHD on 10/26/19.  Plan for HD again today and will keep on TTS schedule for now.   1. Hold off on resuming CRRT, however given her biventricular failure and issues with refractory a fib  and hypotension, she will be difficult to manage with ongoing HD. 2. She did have 450 cc of urine yesterday but only 200 overnight. 3. 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 Continue to follow for renal recovery.  2. 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 Cardiology following. On milrinone gtt. CVP12-13.Did not respond toincreaseoflasix to 160mg  tid. Plan for UF with HD as tolerated. 3. Valvular heart disease- moderate mitral and tricuspid regurgitation. 4. Persistent A fib- changed to toprol xl per cardiology.  On heparin 5. Vascular access- will need tunneled HD catheter if no signs of recovering renal function.  RIJ temp cath in place for 9 days. 6. covid-19 infection- per PCCM 7. Anemia of critical illness- transfuse prn. Given Aranesp on 10/19/19 and will follow for redosing.  Donetta Potts, MD [employer] [phone]

## 2019-10-28 NOTE — Progress Notes (Signed)
Patient ID: Melissa Adams, female   DOB: 03/13/1946, 73 y.o.   MRN: [id number]     Advanced Heart Failure Rounding Note  PCP-Cardiologist: No primary care provider on file.   Subjective:    CVVH started 11/23 and stopped 11/29.  She is now off milrinone and amiodarone.  She has had poor UOP, appears to be HD-dependent. She had HD today.   She remains in atrial fibrillation with controlled rate 80s-90s. BP stable.   TEE was done 12/1, showing LV EF 20% with moderate LV dilation, moderately dilated and moderately dysfunctional RV, mod-severe TR, moderate MR.  DCCV was done after but she only maintained NSR for a few beats with each shock and relapsed to atrial fibrillation.   Objective:   Weight Range: 109.6 kg Body mass index is 42.8 kg/m.   Vital Signs:   Temp:  [97.5 F (36.4 C)-98.4 F (36.9 C)] 97.5 F (36.4 C) (12/03 1647) Pulse Rate:  [49-149] 71 (12/03 1647) Resp:  [11-30] 18 (12/03 1647) BP: (74-120)/(38-95) 105/74 (12/03 1647) SpO2:  [92 %-100 %] 98 % (12/03 1647) Last BM Date: 10/26/19  Weight change: Filed Weights   10/26/19 0500 10/26/19 1245 10/27/19 0400  Weight: 109.5 kg 106.5 kg 109.6 kg    Intake/Output:   Intake/Output Summary (Last 24 hours) at 10/28/2019 1800 Last data filed at 10/28/2019 1428 Gross per 24 hour  Intake 389.24 ml  Output 200 ml  Net 189.24 ml      Physical Exam    unable to complete due to COVID + status.In order to preserve PPE equipment and to minimize exposure to providers.Notes from other caregivers reviewed.   Telemetry   Atrial fibrillation rate 80s-90s with occasional PVCs (personally reviewed).   Labs    CBC Recent Labs    10/27/19 0402 10/28/19 0716  WBC 5.1 5.7  HGB 9.0* 9.0*  HCT 29.9* 30.3*  MCV 82.8 82.3  PLT 137* 536   Basic Metabolic Panel Recent Labs    10/27/19 0402 10/28/19 0716  NA 131* 132*  K 4.1 4.0  CL 96* 95*  CO2 23 26  GLUCOSE 75 79  BUN 14 23  CREATININE 3.21* 4.52*   CALCIUM 8.6* 9.1  MG 2.4 2.3  PHOS 3.4 3.6   Liver Function Tests Recent Labs    10/27/19 0402 10/28/19 0716  AST 19  --   ALT 21  --   ALKPHOS 138*  --   BILITOT 1.0  --   PROT 5.4*  --   ALBUMIN 2.5*  2.6* 2.8*   No results for input(s): LIPASE, AMYLASE in the last 72 hours. Cardiac Enzymes No results for input(s): CKTOTAL, CKMB, CKMBINDEX, TROPONINI in the last 72 hours.  BNP: BNP (last 3 results) Recent Labs    10/11/19 1309  BNP 3,374.7*    ProBNP (last 3 results) No results for input(s): PROBNP in the last 8760 hours.   D-Dimer No results for input(s): DDIMER in the last 72 hours. Hemoglobin A1C No results for input(s): HGBA1C in the last 72 hours. Fasting Lipid Panel No results for input(s): CHOL, HDL, LDLCALC, TRIG, CHOLHDL, LDLDIRECT in the last 72 hours. Thyroid Function Tests No results for input(s): TSH, T4TOTAL, T3FREE, THYROIDAB in the last 72 hours.  Invalid input(s): FREET3  Other results:   Imaging    No results found.   Medications:     Scheduled Medications: . allopurinol  200 mg Oral Daily  . atorvastatin  40 mg Oral Daily  . Chlorhexidine  Gluconate Cloth  6 each Topical Daily  . Chlorhexidine Gluconate Cloth  6 each Topical Q0600  . heparin      . mouth rinse  15 mL Mouth Rinse BID  . metoprolol succinate  25 mg Oral BID  . multivitamin with minerals  1 tablet Oral Daily  . vitamin C  500 mg Oral Daily  . zinc sulfate  220 mg Oral Daily    Infusions: . sodium chloride Stopped (10/25/19 1258)  . sodium chloride    . sodium chloride    . heparin 1,050 Units/hr (10/28/19 0200)    PRN Medications: sodium chloride, sodium chloride, sodium chloride, acetaminophen, alteplase, bismuth subsalicylate, cyclobenzaprine, heparin, heparin, lidocaine (PF), lidocaine-prilocaine, ondansetron (ZOFRAN) IV, pentafluoroprop-tetrafluoroeth, polyethylene glycol, sodium chloride flush, traMADol, white petrolatum   Assessment/Plan   1.  Acute on chronic systolic CHF:  Nonischemic cardiomyopathy, cath in [place] in 9/20 with no significant coronary disease.  Echo with EF 20-25% with dilated and severely dysfunctional RV with D-shaped interventricular septum.  MR appears moderate and TR appears moderate.  With dilated RV, V/Q scan was done and was negative.  Biventricular failure.  Last echo in [place] per report showed EF 40-45%.  Cause of fall in EF uncertain, could be due to atrial fibrillation with RVR (tachycardia-mediated).  Apparently chronic AF but last clinic note from outpatient cardiologist outlines plan for possible atrial fibrillation ablation.  Myocarditis, potentially from COVID-19, is possible.  It is also possible that she has a stress cardiomyopathy due to medical illness/COVID-19 infection.  I have been concerned for low output HF with rise in creatinine and difficulty with diuresis.  Started milrinone at 0.25, milrinone now off with stable co-ox.  With poor diuresis, CVVH started 11/23 and Lasix gtt stopped.  CVVH stopped 11/29.  Poor response to high dose IV Lasix, she appears to be HD-dependent.  She has started intermittent HD.   - It looks like she is going to need HD going forwards for volume management => HD and future diuretic trials per nephrology.  2. Valvular heart disease: Patient was supposed to have mitral and tricuspid valve replacements this week.  TEE in 9/20 in [place] showed mod-severe MR and severe TR.  On my read of the echo done here, she has moderate MR and moderate TR (both central and likely functional).  TEE on 12/1 showed mod-severe TR and moderate MR.  Her main issue here seems to be biventricular failure and would hold off on valve procedures for the time being.   3. Atrial fibrillation: Chronic per outpatient notes. Per daughter, she has had cardioversions in the past. There apparently has been consideration for atrial fibrillation ablation. HR controlled in 80s-90s off milrinone.  She failed DCCV on  amiodarone on 12/1. I think she is unlikely to go back into NSR at this point.  She is now off amiodarone.  - Continue Toprol XL 25 mg bid for rate control.  - Continue heparin gtt for now, transition to Eliquis eventually when no further procedures anticipated.  4. COVID-19 infection: No PNA on CXR.  On supplemental O2 but this may be due to CHF. Seems to be doing ok from this standpoint. Finished remdesivir.  5. AKI: Per report, baseline creatinine around 1.2, increased up to 3.47.  Suspect primarily cardiorenal. She was volume overloaded and did not diurese adequately, so CVVH started. CVVH stopped 11/29, poor response to high dose IV Lasix.  Intermittent HD started on 12/1.  - Ongoing management per renal.  6.  Hyponatremia: Hypervolemic hypernatremia.  She got tolvaptan on 11/22.  Improved with CVVH.  At this point, appears that she will be HD dependent.  She will likely remain in atrial fibrillation but HR controlled.  We will follow at a distance.    Length of Stay: [place], MD  10/28/2019, 6:00 PM  Advanced Heart Failure Team Pager [phone] (M-F; 7a - 4p)  Please contact [facility] for night-coverage after hours (4p -7a ) and weekends on [email or web address]

## 2019-10-28 NOTE — Progress Notes (Signed)
ANTICOAGULATION CONSULT NOTE  Pharmacy Consult for Heparin Indication: atrial fibrillation  Patient Measurements: Height: 5\' 3"  (160 cm) Weight: 241 lb 10 oz (109.6 kg) IBW/kg (Calculated) : 52.4 Heparin Dosing Weight: 79.8 kg  Vital Signs: Temp: 98.4 F (36.9 C) (12/03 0317) Temp Source: Oral (12/03 0317) BP: 120/76 (12/03 0317) Pulse Rate: 74 (12/03 0317)  Labs: Recent Labs    10/26/19 0405  10/26/19 0407  10/26/19 1550 10/27/19 0402 10/28/19 0716 10/28/19 0718  HGB  --    < > 8.8*  --   --  9.0* 9.0*  --   HCT  --   --  29.9*  --   --  29.9* 30.3*  --   PLT  --   --  165  --   --  137* 171  --   LABPROT 15.0  --   --   --   --   --   --   --   INR 1.2  --   --   --   --   --   --   --   HEPARINUNFRC  --   --   --    < > 0.31 0.45  --  0.56  CREATININE  --   --  3.54*  --   --  3.21* 4.52*  --    < > = values in this interval not displayed.   Assessment: 73 yr old female presenting with SOB and weight gain - on apixaban PTA for Afib, on hold for now. Last dose of Eliquis was on 11/8.   Pt was told by outside hospital that she required mitral/tricupsid valve replacement > this surgery was deferred upon transfer to [facility]. Now delayed due to incidentally found to be COVID + s/p treatment.  LE dopplers and V/Q scan neg for PE/DVT. Cardiology planning TEE-DCCV this admission.  Heparin level remains within goal. Hg low but stable, plt up to 171. No bleed issues documented.  Goal of Therapy:  Heparin level 0.3-0.7 units/ml  Monitor platelets by anticoagulation protocol: Yes  Plan:  Continue heparin IV at 1050 units/hr Monitor daily heparin level and CBC, s/sx bleeding   Elicia Lamp, PharmD, BCPS Clinical Pharmacist Please check AMION for all [place] contact numbers 10/28/2019 8:44 AM

## 2019-10-28 NOTE — Progress Notes (Signed)
Physical Therapy Treatment Patient Details Name: Melissa Adams MRN: [id number] DOB: Aug 08, 1946 Today's Date: 10/28/2019    History of Present Illness 73 y.o. female with a medical history of heart failure, atrial fibrillation, hypertension, sleep apnea, who presented to the emergency department with complaints of shortness of breath and weight gain. 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 In ED found to have an elevated BNP with dyspnea on exertion. CXR showed small right pleural effusion with bibasilar atelectasis. Admitted 10/11/19 Cardiorenal syndrome-was on CRRT from 11/23-11/29,    PT Comments    Pt appreciative of being out of ICU, but expresses desire to be home. Pt continues to be limited in safe mobility by generalized weakness and decreased endurance. Pt requires min guard for transfers and min A for steadying with ambulation of 45 with RW. D/c plans remain appropriate. PT will continue to follow acutely.     Follow Up Recommendations  Home health PT;Supervision/Assistance - 24 hour     Equipment Recommendations  None recommended by PT       Precautions / Restrictions Precautions Precautions: Fall Restrictions Weight Bearing Restrictions: No    Mobility  Bed Mobility               General bed mobility comments: OOB in recliner  Transfers Overall transfer level: Needs assistance Equipment used: None Transfers: Sit to/from Stand;Stand Pivot Transfers Sit to Stand: Min guard         General transfer comment: min guard for safety, vc for hand placement for power up   Ambulation/Gait Ambulation/Gait assistance: Min assist Gait Distance (Feet): 45 Feet Assistive device: Rolling walker (2 wheeled) Gait Pattern/deviations: Step-through pattern;Decreased step length - right;Decreased step length - left;Shuffle Gait velocity: slowed Gait velocity interpretation: <1.8 ft/sec, indicate of risk for recurrent falls General Gait Details: min  A for management of lines and leads, slowed slightly unsteady gait, no overt LoB         Balance Overall balance assessment: Mild deficits observed, not formally tested                                          Cognition Arousal/Alertness: Awake/alert Behavior During Therapy: WFL for tasks assessed/performed Overall Cognitive Status: Within Functional Limits for tasks assessed                                           General Comments General comments (skin integrity, edema, etc.): SaO2 on RA in sitting 97%O2 with ambulation lost wave form, new probe placed in sitting and SaO2 98%O2 on RA, pt with only slight dyspnea after walking      Pertinent Vitals/Pain Pain Assessment: No/denies pain           PT Goals (current goals can now be found in the care plan section) Acute Rehab PT Goals Patient Stated Goal: go home PT Goal Formulation: With patient Time For Goal Achievement: 10/27/19 Potential to Achieve Goals: Good Progress towards PT goals: Progressing toward goals    Frequency    Min 3X/week      PT Plan Current plan remains appropriate       AM-PAC PT "6 Clicks" Mobility   Outcome Measure  Help needed turning from your back to your side while in a flat bed without using  bedrails?: None Help needed moving from lying on your back to sitting on the side of a flat bed without using bedrails?: A Little Help needed moving to and from a bed to a chair (including a wheelchair)?: A Little Help needed standing up from a chair using your arms (e.g., wheelchair or bedside chair)?: A Little Help needed to walk in hospital room?: A Little Help needed climbing 3-5 steps with a railing? : A Lot 6 Click Score: 18    End of Session   Activity Tolerance: Patient tolerated treatment well Patient left: in bed;with call bell/phone within reach;with nursing/sitter in room(in chair position ) Nurse Communication: Mobility status PT Visit  Diagnosis: Unsteadiness on feet (R26.81);Other abnormalities of gait and mobility (R26.89);Muscle weakness (generalized) (M62.81);History of falling (Z91.81);Difficulty in walking, not elsewhere classified (R26.2);Pain Pain - Right/Left: Right Pain - part of body: (flank)     Time: 1533-1600 PT Time Calculation (min) (ACUTE ONLY): 27 min  Charges:  $Gait Training: 23-37 mins                     Riggins Cisek B. Migdalia Dk PT, DPT Acute Rehabilitation Services Pager [phone] Office [phone]    [place] 10/28/2019, 5:06 PM

## 2019-10-28 NOTE — Progress Notes (Signed)
Pt's daughter Melissa Adams updated on patient and plan of care.  Daughter informed that patient will be moving to 5W tomorrow and will update with room number and new room phone number.

## 2019-10-29 ENCOUNTER — Inpatient Hospital Stay (HOSPITAL_COMMUNITY): Payer: Medicare Other

## 2019-10-29 LAB — RENAL FUNCTION PANEL
Albumin: 2.8 g/dL — ABNORMAL LOW (ref 3.5–5.0)
Anion gap: 9 (ref 5–15)
BUN: 14 mg/dL (ref 8–23)
CO2: 27 mmol/L (ref 22–32)
Calcium: 8.9 mg/dL (ref 8.9–10.3)
Chloride: 97 mmol/L — ABNORMAL LOW (ref 98–111)
Creatinine, Ser: 3.32 mg/dL — ABNORMAL HIGH (ref 0.44–1.00)
GFR calc Af Amer: 15 mL/min — ABNORMAL LOW (ref >60)
GFR calc non Af Amer: 13 mL/min — ABNORMAL LOW (ref >60)
Glucose, Bld: 83 mg/dL (ref 70–99)
Phosphorus: 2.7 mg/dL (ref 2.5–4.6)
Potassium: 3.9 mmol/L (ref 3.5–5.1)
Sodium: 133 mmol/L — ABNORMAL LOW (ref 135–145)

## 2019-10-29 LAB — COOXEMETRY PANEL
Carboxyhemoglobin: 1.4 % (ref 0.5–1.5)
Methemoglobin: 0.5 % (ref 0.0–1.5)
O2 Saturation: 58.9 %
Total hemoglobin: 9.5 g/dL — ABNORMAL LOW (ref 12.0–16.0)

## 2019-10-29 LAB — CBC
HCT: 28.7 % — ABNORMAL LOW (ref 36.0–46.0)
Hemoglobin: 8.6 g/dL — ABNORMAL LOW (ref 12.0–15.0)
MCH: 24.5 pg — ABNORMAL LOW (ref 26.0–34.0)
MCHC: 30 g/dL (ref 30.0–36.0)
MCV: 81.8 fL (ref 80.0–100.0)
Platelets: 156 10*3/uL (ref 150–400)
RBC: 3.51 MIL/uL — ABNORMAL LOW (ref 3.87–5.11)
RDW: 24.9 % — ABNORMAL HIGH (ref 11.5–15.5)
WBC: 5.1 10*3/uL (ref 4.0–10.5)
nRBC: 0 % (ref 0.0–0.2)

## 2019-10-29 LAB — HEPARIN LEVEL (UNFRACTIONATED): Heparin Unfractionated: 0.4 IU/mL (ref 0.30–0.70)

## 2019-10-29 LAB — MAGNESIUM: Magnesium: 2.1 mg/dL (ref 1.7–2.4)

## 2019-10-29 MED ORDER — METOPROLOL SUCCINATE ER 25 MG PO TB24
12.5000 mg | ORAL_TABLET | Freq: Two times a day (BID) | ORAL | Status: DC
  Administered 2019-10-29 – 2019-11-05 (×12): 12.5 mg via ORAL
  Filled 2019-10-29 (×13): qty 1

## 2019-10-29 NOTE — Progress Notes (Signed)
Patient ID: Melissa Adams, female   DOB: [birth year], 73 y.o.   MRN: [id number] [facility] Progress Note   Assessment/ Plan:   1. Acute kidney Injury on chronic kidney disease stage III: Anuric overnight and without any evidence of renal recovery.  Etiology suspected to be cardiorenal syndrome and she has unfortunately been refractory to diuretic therapy in the past.  Continue intermittent hemodialysis on TTS schedule with low likelihood that she will have meaningful recovery to come off of dialysis.  We will begin plans for conversion to tunneled hemodialysis catheter and outpatient dialysis unit placement next week. 2.  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  Ongoing volume management with hemodialysis.  She also has underlying valvular heart disease and atrial fibrillation. 3.  Atrial fibrillation: Appears to be rate controlled and with some challenges to reliable beta-blocker dosing due to hypotension. 4.  Anemia of critical illness: Hemoglobin/hematocrit appears stable without any overt blood loss.  We will continue to follow. 5.  COVID-19 infection: Low-grade fever noted overnight, decent oxygen saturation/respiratory status.  Subjective:   Transient hypotension earlier this morning, metoprolol dose reduced.  Underwent dialysis yesterday that she tolerated without problems.   Objective:   BP (S) (!) 94/54 (BP Location: Left Leg) Comment: MD notified  Pulse 83   Temp 98.3 F (36.8 C) (Oral)   Resp 18   Ht 5\' 3"  (1.6 m)   Wt 110.8 kg   SpO2 97%   BMI 43.27 kg/m   Intake/Output Summary (Last 24 hours) at 10/29/2019 5885 Last data filed at 10/28/2019 1428 Gross per 24 hour  Intake 240 ml  Output -  Net 240 ml   Weight change:   Physical Exam: Exam findings reviewed with RN/Dr. Tyrell Antonio  Imaging: Dg Chest Port 1 View  Result Date: 10/29/2019 CLINICAL DATA:  Shortness of breath, COVID  positive. EXAM: PORTABLE CHEST 1 VIEW COMPARISON:  10/25/2019. FINDINGS: Trachea is midline. Right IJ dialysis catheter tip is in the proximal SVC. Left PICC tip is at the SVC RA junction. Heart is enlarged, stable. Lungs are hyperinflated with mild basilar interstitial prominence and indistinctness, unchanged. No airspace consolidation or peripheral airspace opacities. No pleural fluid. IMPRESSION: 1. Very mild basilar interstitial prominence and indistinctness is of uncertain acuity. Edema or scarring can have this appearance. 2. No definitive evidence of COVID pneumonia. Electronically Signed   By: Lorin Picket M.D.   On: 10/29/2019 08:32    Labs: BMET Recent Labs  Lab 10/24/19 0420 10/24/19 1600 10/25/19 0353 10/26/19 0407 10/27/19 0402 10/28/19 0716 10/29/19 0618  NA 135 132* 131* 130* 131* 132* 133*  K 4.4 4.4 4.3 4.3 4.1 4.0 3.9  CL 97* 98 96* 97* 96* 95* 97*  CO2 26 26 25 22 23 26 27   GLUCOSE 87 99 86 80 75 79 83  BUN 7* 7* 12 16 14 23 14   CREATININE 1.57* 1.58* 2.44* 3.54* 3.21* 4.52* 3.32*  CALCIUM 8.8* 8.7* 8.6* 8.8* 8.6* 9.1 8.9  PHOS 2.1* 2.4* 3.0 3.4 3.4 3.6 2.7   CBC Recent Labs  Lab 10/25/19 0353 10/26/19 0407 10/27/19 0402 10/28/19 0716  WBC 6.9 6.2 5.1 5.7  HGB 8.3* 8.8* 9.0* 9.0*  HCT 27.1* 29.9* 29.9* 30.3*  MCV 80.7 82.8 82.8 82.3  PLT 143* 165 137* 171   Medications:    . allopurinol  200 mg Oral Daily  . atorvastatin  40 mg Oral Daily  . Chlorhexidine Gluconate Cloth  6 each Topical Daily  . Chlorhexidine Gluconate Cloth  6 each Topical Q0600  . mouth rinse  15 mL Mouth Rinse BID  . metoprolol succinate  12.5 mg Oral BID  . multivitamin with minerals  1 tablet Oral Daily  . vitamin C  500 mg Oral Daily  . zinc sulfate  220 mg Oral Daily   Elmarie Shiley, MD 10/29/2019, 9:38 AM

## 2019-10-29 NOTE — Progress Notes (Signed)
ANTICOAGULATION CONSULT NOTE  Pharmacy Consult for Heparin Indication: atrial fibrillation  Patient Measurements: Height: 5\' 3"  (160 cm) Weight: 244 lb 4.3 oz (110.8 kg) IBW/kg (Calculated) : 52.4 Heparin Dosing Weight: 79.8 kg  Vital Signs: Temp: 98.3 F (36.8 C) (12/04 0759) Temp Source: Oral (12/04 0759) BP: 94/54 (12/04 0759) Pulse Rate: 83 (12/04 0759)  Labs: Recent Labs    10/27/19 0402 10/28/19 0716 10/28/19 0718 10/29/19 0618 10/29/19 0619  HGB 9.0* 9.0*  --   --   --   HCT 29.9* 30.3*  --   --   --   PLT 137* 171  --   --   --   HEPARINUNFRC 0.45  --  0.56  --  0.40  CREATININE 3.21* 4.52*  --  3.32*  --    Assessment: 73 yr old female presenting with SOB and weight gain - on apixaban PTA for Afib, on hold for now. Last dose of Eliquis was on 11/8.   Pt was told by outside hospital that she required mitral/tricupsid valve replacement > this surgery was deferred upon transfer to [facility]. Now delayed due to incidentally found to be COVID + s/p treatment.  LE dopplers and V/Q scan neg for PE/DVT. Cardiology planning TEE-DCCV this admission.  Heparin level remains within goal. CBC stable (still pending for today). No bleed issues documented.  Goal of Therapy:  Heparin level 0.3-0.7 units/ml  Monitor platelets by anticoagulation protocol: Yes  Plan:  Continue heparin IV at 1050 units/hr Monitor daily heparin level and CBC, s/sx bleeding   Elicia Lamp, PharmD, BCPS Clinical Pharmacist Please check AMION for all [place] contact numbers 10/29/2019 9:27 AM

## 2019-10-29 NOTE — Progress Notes (Signed)
Hospitalist notified of pt's low bp 94/54 & low bp overnight 79/52. Orders received to hold Metoprolol and speak with Cardiology.

## 2019-10-29 NOTE — Progress Notes (Signed)
PROGRESS NOTE    Melissa Adams  MVH:[id number] DOB: September 21, 1946 DOA: 10/11/2019 PCP: System, Provider Not In   Brief Narrative: Melissa Adams is a 73 y.o. F with HF previous EF 40-45%, AF, on Eliquis, HTN, OSA on her to severe MR and severe TR, CKD 3, and obesity who presented with 2 to 3 weeks progressive shortness of breath and 22 lb weight gain.  She had tried doubling her Lasix without improvement, then noted to have elevated creatinine, and so was sent to the ER.  In the ER, BNP elevated, creatinine elevated.  Given Lasix and hospitalist service were asked to evaluate for CHF flare.   Assessment & Plan:   Active Problems:   Acute CHF (congestive heart failure) (HCC)   Atrial fibrillation, chronic (HCC)   Essential hypertension   Sleep apnea   AKI (acute kidney injury) (HCC)   Gout   Normocytic anemia   CHF exacerbation (HCC)   Dyspnea   COVID-19 virus infection   Pressure injury of skin   Acute on chronic systolic heart failure (HCC)   1-Acute on chronic systolic and diastolic congestive heart failure exacerbation: Moderate to severe mitral regurgitation and severe tricuspid regurgitation:  -Patient was a started on diuretics.  Creatinine worsened, repeat echo with ejection fraction down to 20 to 25%, advanced heart failure team consulted and following. -Patient was a started on midodrine and Lasix drip.  Renal function continued to worsen and patient was a started on CRRT and subsequently hemodialysis. -First hemodialysis on 12/1 -discussed with nephrology will stop lasix.  -Off midodrine.  -fluid manage with HD>   2-Cardiorenal syndrome, chronic kidney disease a stage III: Creatinine increased up to 3.5.  Patient started on CRRT and subsequently transition to hemodialysis. Nephrology following. Had HD 12-03.   3-Hyponatremia: Tolvaptan  last dose given on 11/22.  A stable monitor.  4-Chronic A fib;  DCCV was unsuccessful.  On heparin gtt.  Amiodarone  discontinue by cardiology on 12-02.  Started on metoprolol 12-02. She has not been able to tolerates Metoprolol for last 2 days due to hypotension.   5-Coronavirus infection;  ? Pneumonitis.  Needs to remain in isolation for 21 days, from positive test, until Dec 8th.  Completed Remdesivir 5 days and 3 days Dexamethasone.  COVID-19 Labs  No results for input(s): DDIMER, FERRITIN, LDH, CRP in the last 72 hours.  Lab Results  Component Value Date   SARSCOV2NAA POSITIVE (A) 10/12/2019   SARSCOV2NAA POSITIVE (A) 10/11/2019    Fevers, mild Repeated chest x ray 12-04 negative for PNA>  Blood culture ordered.  Check UA>  Monitor.   Anemia of chronic diseases;  Stable.   OSA;  Resume CPAP when off precaution.   HTN; BP has been soft.   Stage II pressure injury right labia Not POA. Local care.   Gout; continue with allopurinol.    Pressure Injury 10/21/19 Labia Right;Medial Stage II -  Partial thickness loss of dermis presenting as a shallow open ulcer with a red, pink wound bed without slough. (Active)  10/21/19 1200  Location: [facility]  Location Orientation: Right;Medial  Staging: Stage II -  Partial thickness loss of dermis presenting as a shallow open ulcer with a red, pink wound bed without slough.  Wound Description (Comments):   Present on Admission: No     Estimated body mass index is 43.27 kg/m as calculated from the following:   Height as of this encounter: 5\' 3"  (1.6 m).   Weight as of this encounter:  110.8 kg.   DVT prophylaxis: on Heparin  Code Status: full code Family Communication: Melissa Adams updated.  Disposition Plan: remain in the hospital for treatment of renal faiure and heart failure.  Consultants:   Cardiology  Nephrology   Procedures:   11/17 renal US -- no hydronephrosis, no medicorenal disease  11/17 VQ scan -- low risk  11/17 echo -- EF 20-25%, TR and MR  11/18 lower extremity US -- no DVT  11/18 PICC placed and milrinone started   11/21 Tolvaptan  11/22 Tolvaptan  11/23 dialyis cather placed CRRT started  11/29 CRRT stopped  11/30 milrinone stopped and transfer OOU  12/1 TEE and DCCV (failed)    Antimicrobials:   Remdesivir 11/17 >> 11/22   Subjective: She has significant LE edema She denies chest pain,. Diarrhea cough    Objective: Vitals:   10/29/19 0100 10/29/19 0315 10/29/19 0319 10/29/19 0759  BP:  (!) 79/52 (!) 88/54 (S) (!) 94/54  Pulse: 76  94 83  Resp: 16  19 18   Temp:   100.2 F (37.9 C) 98.3 F (36.8 C)  TempSrc:   Oral Oral  SpO2: 93%  96% 97%  Weight:   110.8 kg   Height:        Intake/Output Summary (Last 24 hours) at 10/29/2019 1037 Last data filed at 10/28/2019 1428 Gross per 24 hour  Intake 240 ml  Output -  Net 240 ml   Filed Weights   10/26/19 1245 10/27/19 0400 10/29/19 0319  Weight: 106.5 kg 109.6 kg 110.8 kg    Examination:  General exam: NAD Respiratory system: CTA Cardiovascular system: S 1, S 2 RRR Gastrointestinal system: BS present, soft, nt Central nervous system: alert, follows command Extremities: Symmetric power.  Skin: no rashes.    Data Reviewed: I have personally reviewed following labs and imaging studies  CBC: Recent Labs  Lab 10/25/19 0353 10/26/19 0407 10/27/19 0402 10/28/19 0716 10/29/19 0906  WBC 6.9 6.2 5.1 5.7 5.1  HGB 8.3* 8.8* 9.0* 9.0* 8.6*  HCT 27.1* 29.9* 29.9* 30.3* 28.7*  MCV 80.7 82.8 82.8 82.3 81.8  PLT 143* 165 137* 171 546   Basic Metabolic Panel: Recent Labs  Lab 10/25/19 0353 10/26/19 0407 10/27/19 0402 10/28/19 0716 10/29/19 0618 10/29/19 0619  NA 131* 130* 131* 132* 133*  --   K 4.3 4.3 4.1 4.0 3.9  --   CL 96* 97* 96* 95* 97*  --   CO2 25 22 23 26 27   --   GLUCOSE 86 80 75 79 83  --   BUN 12 16 14 23 14   --   CREATININE 2.44* 3.54* 3.21* 4.52* 3.32*  --   CALCIUM 8.6* 8.8* 8.6* 9.1 8.9  --   MG 2.4 2.5* 2.4 2.3  --  2.1  PHOS 3.0 3.4 3.4 3.6 2.7  --    GFR: Estimated Creatinine Clearance:  18.3 mL/min (A) (by C-G formula based on SCr of 3.32 mg/dL (H)). Liver Function Tests: Recent Labs  Lab 10/25/19 0353 10/26/19 0407 10/27/19 0402 10/28/19 0716 10/29/19 0618  AST  --   --  19  --   --   ALT  --   --  21  --   --   ALKPHOS  --   --  138*  --   --   BILITOT  --   --  1.0  --   --   PROT  --   --  5.4*  --   --  ALBUMIN 2.8* 2.8* 2.5*  2.6* 2.8* 2.8*   No results for input(s): LIPASE, AMYLASE in the last 168 hours. No results for input(s): AMMONIA in the last 168 hours. Coagulation Profile: Recent Labs  Lab 10/26/19 0405  INR 1.2   Cardiac Enzymes: No results for input(s): CKTOTAL, CKMB, CKMBINDEX, TROPONINI in the last 168 hours. BNP (last 3 results) No results for input(s): PROBNP in the last 8760 hours. HbA1C: No results for input(s): HGBA1C in the last 72 hours. CBG: Recent Labs  Lab 10/24/19 0748 10/24/19 1105 10/24/19 1506 10/24/19 2144 10/25/19 0753  GLUCAP 83 95 107* 103* 88   Lipid Profile: No results for input(s): CHOL, HDL, LDLCALC, TRIG, CHOLHDL, LDLDIRECT in the last 72 hours. Thyroid Function Tests: No results for input(s): TSH, T4TOTAL, FREET4, T3FREE, THYROIDAB in the last 72 hours. Anemia Panel: No results for input(s): VITAMINB12, FOLATE, FERRITIN, TIBC, IRON, RETICCTPCT in the last 72 hours. Sepsis Labs: No results for input(s): PROCALCITON, LATICACIDVEN in the last 168 hours.  No results found for this or any previous visit (from the past 240 hour(s)).       Radiology Studies: Dg Chest Port 1 View  Result Date: 10/29/2019 CLINICAL DATA:  Shortness of breath, COVID positive. EXAM: PORTABLE CHEST 1 VIEW COMPARISON:  10/25/2019. FINDINGS: Trachea is midline. Right IJ dialysis catheter tip is in the proximal SVC. Left PICC tip is at the SVC RA junction. Heart is enlarged, stable. Lungs are hyperinflated with mild basilar interstitial prominence and indistinctness, unchanged. No airspace consolidation or peripheral airspace  opacities. No pleural fluid. IMPRESSION: 1. Very mild basilar interstitial prominence and indistinctness is of uncertain acuity. Edema or scarring can have this appearance. 2. No definitive evidence of COVID pneumonia. Electronically Signed   By: Lorin Picket M.D.   On: 10/29/2019 08:32        Scheduled Meds: . allopurinol  200 mg Oral Daily  . atorvastatin  40 mg Oral Daily  . Chlorhexidine Gluconate Cloth  6 each Topical Daily  . Chlorhexidine Gluconate Cloth  6 each Topical Q0600  . mouth rinse  15 mL Mouth Rinse BID  . metoprolol succinate  12.5 mg Oral BID  . multivitamin with minerals  1 tablet Oral Daily  . vitamin C  500 mg Oral Daily  . zinc sulfate  220 mg Oral Daily   Continuous Infusions: . sodium chloride Stopped (10/25/19 1258)  . sodium chloride 100 mL (10/29/19 0334)  . sodium chloride    . heparin 1,050 Units/hr (10/29/19 0448)     LOS: 17 days    Time spent: 35 minutes.     Elmarie Shiley, MD [facility]   If 7PM-7AM, please contact night-coverage [email or web address] Password [email or web address] 10/29/2019, 10:37 AM

## 2019-10-29 NOTE — Progress Notes (Signed)
Pt's daughter updated on pt's condition and plan of care.

## 2019-10-29 NOTE — Progress Notes (Signed)
Patient ID: Melissa Adams, female   DOB: [birth year], 73 y.o.   MRN: [id number]  Metoprolol being held.  HR reasonably controlled.  Would low dose to Toprol XL 12.5 mg bid.  Tolerate SBP > 90 (she generally has SBP 90s-110s), low dose metoprolol is not going to have much effect on the BP.   [name] 10/29/2019 9:13 AM

## 2019-10-30 ENCOUNTER — Inpatient Hospital Stay (HOSPITAL_COMMUNITY): Payer: Medicare Other

## 2019-10-30 LAB — CBC
HCT: 29.5 % — ABNORMAL LOW (ref 36.0–46.0)
Hemoglobin: 8.9 g/dL — ABNORMAL LOW (ref 12.0–15.0)
MCH: 24.8 pg — ABNORMAL LOW (ref 26.0–34.0)
MCHC: 30.2 g/dL (ref 30.0–36.0)
MCV: 82.2 fL (ref 80.0–100.0)
Platelets: 155 10*3/uL (ref 150–400)
RBC: 3.59 MIL/uL — ABNORMAL LOW (ref 3.87–5.11)
RDW: 24.9 % — ABNORMAL HIGH (ref 11.5–15.5)
WBC: 5.5 10*3/uL (ref 4.0–10.5)
nRBC: 0 % (ref 0.0–0.2)

## 2019-10-30 LAB — RENAL FUNCTION PANEL
Albumin: 2.9 g/dL — ABNORMAL LOW (ref 3.5–5.0)
Anion gap: 10 (ref 5–15)
BUN: 23 mg/dL (ref 8–23)
CO2: 27 mmol/L (ref 22–32)
Calcium: 8.8 mg/dL — ABNORMAL LOW (ref 8.9–10.3)
Chloride: 96 mmol/L — ABNORMAL LOW (ref 98–111)
Creatinine, Ser: 4.15 mg/dL — ABNORMAL HIGH (ref 0.44–1.00)
GFR calc Af Amer: 12 mL/min — ABNORMAL LOW (ref >60)
GFR calc non Af Amer: 10 mL/min — ABNORMAL LOW (ref >60)
Glucose, Bld: 87 mg/dL (ref 70–99)
Phosphorus: 2.8 mg/dL (ref 2.5–4.6)
Potassium: 4.3 mmol/L (ref 3.5–5.1)
Sodium: 133 mmol/L — ABNORMAL LOW (ref 135–145)

## 2019-10-30 LAB — COOXEMETRY PANEL
Carboxyhemoglobin: 1.5 % (ref 0.5–1.5)
Methemoglobin: 0.5 % (ref 0.0–1.5)
O2 Saturation: 70.4 %
Total hemoglobin: 9.1 g/dL — ABNORMAL LOW (ref 12.0–16.0)

## 2019-10-30 LAB — HEPARIN LEVEL (UNFRACTIONATED): Heparin Unfractionated: 0.43 IU/mL (ref 0.30–0.70)

## 2019-10-30 LAB — MAGNESIUM: Magnesium: 2.1 mg/dL (ref 1.7–2.4)

## 2019-10-30 MED ORDER — SODIUM CHLORIDE 0.9 % IV SOLN
2.0000 g | Freq: Once | INTRAVENOUS | Status: AC
  Administered 2019-10-30: 2 g via INTRAVENOUS
  Filled 2019-10-30: qty 2

## 2019-10-30 MED ORDER — SODIUM CHLORIDE 0.9 % IV SOLN
2.0000 g | INTRAVENOUS | Status: AC
  Administered 2019-11-02: 2 g via INTRAVENOUS
  Filled 2019-10-30 (×2): qty 2

## 2019-10-30 MED ORDER — HEPARIN SODIUM (PORCINE) 1000 UNIT/ML IJ SOLN
INTRAMUSCULAR | Status: AC
  Filled 2019-10-30: qty 3

## 2019-10-30 MED ORDER — HEPARIN SODIUM (PORCINE) 1000 UNIT/ML IJ SOLN
2.4000 mL | Freq: Once | INTRAMUSCULAR | Status: AC
  Administered 2019-10-30: 16:00:00 2400 [IU] via INTRAVENOUS
  Filled 2019-10-30: qty 2.4

## 2019-10-30 MED ORDER — SODIUM CHLORIDE 0.9 % IV BOLUS
250.0000 mL | Freq: Once | INTRAVENOUS | Status: AC
  Administered 2019-10-30: 250 mL via INTRAVENOUS

## 2019-10-30 NOTE — Progress Notes (Signed)
PROGRESS NOTE    Melissa Adams  WUJ:[id number] DOB: 1946-07-11 DOA: 10/11/2019 PCP: System, Provider Not In   Brief Narrative: Melissa Adams is a 73 y.o. F with HF previous EF 40-45%, AF, on Eliquis, HTN, OSA on her to severe MR and severe TR, CKD 3, and obesity who presented with 2 to 3 weeks progressive shortness of breath and 22 lb weight gain.  She had tried doubling her Lasix without improvement, then noted to have elevated creatinine, and so was sent to the ER.  In the ER, BNP elevated, creatinine elevated.  Given Lasix and hospitalist service were asked to evaluate for CHF flare.   Assessment & Plan:   Active Problems:   Acute CHF (congestive heart failure) (HCC)   Atrial fibrillation, chronic (HCC)   Essential hypertension   Sleep apnea   AKI (acute kidney injury) (HCC)   Gout   Normocytic anemia   CHF exacerbation (HCC)   Dyspnea   COVID-19 virus infection   Pressure injury of skin   Acute on chronic systolic heart failure (HCC)   1-Acute on chronic systolic and diastolic congestive heart failure exacerbation: Moderate to severe mitral regurgitation and severe tricuspid regurgitation:  -Patient was a started on diuretics.  Creatinine worsened, repeat echo with ejection fraction down to 20 to 25%, advanced heart failure team consulted and following. -Patient was a started on midodrine and Lasix drip.  Renal function continued to worsen and patient was a started on CRRT and subsequently hemodialysis. -First hemodialysis on 12/1 -Off midodrine.  -fluid manage with HD>  -for HD today.   2-Cardiorenal syndrome, chronic kidney disease a stage III: Creatinine increased up to 3.5.  Patient started on CRRT and subsequently transition to hemodialysis. Nephrology following. Had HD 12-03. 12-05  3-Hyponatremia: Tolvaptan  last dose given on 11/22.  A stable monitor.  4-Chronic A fib;  DCCV was unsuccessful.  On heparin gtt.  Amiodarone discontinue by cardiology on  12-02.  Started on metoprolol 12-02. Metoprolol decrease to 12.5 BID  5-Coronavirus infection;  ? Pneumonitis.  Needs to remain in isolation for 21 days, from positive test, until Dec 8th.  Completed Remdesivir 5 days and 3 days Dexamethasone.  COVID-19 Labs  No results for input(s): DDIMER, FERRITIN, LDH, CRP in the last 72 hours.  Lab Results  Component Value Date   SARSCOV2NAA POSITIVE (A) 10/12/2019   SARSCOV2NAA POSITIVE (A) 10/11/2019    PNA; Fevers, mild Repeated chest x ray 12-04 negative for PNA>  Blood culture; no growth to date.  Chest x ray 12-05; new hazy opacity in the right lung base.  Check UA>  Start cefepime, check  sputum culture. MRSA PCR>   Anemia of chronic diseases;  Stable.   OSA;  Resume CPAP when off precaution.   HTN; BP has been soft.   Stage II pressure injury right labia Not POA. Local care.   Gout; continue with allopurinol.    Pressure Injury 10/21/19 Labia Right;Medial Stage II -  Partial thickness loss of dermis presenting as a shallow open ulcer with a red, pink wound bed without slough. (Active)  10/21/19 1200  Location: Labia  Location Orientation: Right;Medial  Staging: Stage II -  Partial thickness loss of dermis presenting as a shallow open ulcer with a red, pink wound bed without slough.  Wound Description (Comments):   Present on Admission: No     Estimated body mass index is 43.23 kg/m as calculated from the following:   Height as of this encounter: 5'  3" (1.6 m).   Weight as of this encounter: 110.7 kg.   DVT prophylaxis: on Heparin  Code Status: full code Family Communication: Daughter updated.  Disposition Plan: remain in the hospital for treatment of renal faiure and heart failure.  Consultants:   Cardiology  Nephrology   Procedures:   11/17 renal US -- no hydronephrosis, no medicorenal disease  11/17 VQ scan -- low risk  11/17 echo -- EF 20-25%, TR and MR  11/18 lower extremity US -- no DVT   11/18 PICC placed and milrinone started  11/21 Tolvaptan  11/22 Tolvaptan  11/23 dialyis cather placed CRRT started  11/29 CRRT stopped  11/30 milrinone stopped and transfer OOU  12/1 TEE and DCCV (failed)    Antimicrobials:   Remdesivir 11/17 >> 11/22   Subjective: She report blood in the sputum. She has had some blood in the sputum at home. She report dyspnea on lying down.  Oxygen probe not acurate need to be change, oxygen drop momentarily to 60 and she was asymptomatic.   Objective: Vitals:   10/29/19 2100 10/30/19 0008 10/30/19 0422 10/30/19 0800  BP: 117/74 116/88  113/77  Pulse:  89 86 (!) 55  Resp:  17 14 10   Temp:  98 F (36.7 C) 97.8 F (36.6 C) 97.8 F (36.6 C)  TempSrc:  Oral Oral Oral  SpO2:  98% 94% 97%  Weight:   110.7 kg   Height:        Intake/Output Summary (Last 24 hours) at 10/30/2019 0954 Last data filed at 10/30/2019 0930 Gross per 24 hour  Intake 1332.82 ml  Output -  Net 1332.82 ml   Filed Weights   10/27/19 0400 10/29/19 0319 10/30/19 0422  Weight: 109.6 kg 110.8 kg 110.7 kg    Examination:  General exam: NAD Respiratory system: CTA Cardiovascular system: S 1, S 2 RRR Gastrointestinal system: BS present, soft, nt Central nervous system: Alert, follows command Extremities: Symmetric power.  Skin: no rashes,.    Data Reviewed: I have personally reviewed following labs and imaging studies  CBC: Recent Labs  Lab 10/26/19 0407 10/27/19 0402 10/28/19 0716 10/29/19 0906 10/30/19 0203  WBC 6.2 5.1 5.7 5.1 5.5  HGB 8.8* 9.0* 9.0* 8.6* 8.9*  HCT 29.9* 29.9* 30.3* 28.7* 29.5*  MCV 82.8 82.8 82.3 81.8 82.2  PLT 165 137* 171 156 517   Basic Metabolic Panel: Recent Labs  Lab 10/26/19 0407 10/27/19 0402 10/28/19 0716 10/29/19 0618 10/29/19 0619 10/30/19 0203  NA 130* 131* 132* 133*  --  133*  K 4.3 4.1 4.0 3.9  --  4.3  CL 97* 96* 95* 97*  --  96*  CO2 22 23 26 27   --  27  GLUCOSE 80 75 79 83  --  87  BUN 16 14 23  14   --  23  CREATININE 3.54* 3.21* 4.52* 3.32*  --  4.15*  CALCIUM 8.8* 8.6* 9.1 8.9  --  8.8*  MG 2.5* 2.4 2.3  --  2.1 2.1  PHOS 3.4 3.4 3.6 2.7  --  2.8   GFR: Estimated Creatinine Clearance: 14.6 mL/min (A) (by C-G formula based on SCr of 4.15 mg/dL (H)). Liver Function Tests: Recent Labs  Lab 10/26/19 0407 10/27/19 0402 10/28/19 0716 10/29/19 0618 10/30/19 0203  AST  --  19  --   --   --   ALT  --  21  --   --   --   ALKPHOS  --  138*  --   --   --  BILITOT  --  1.0  --   --   --   PROT  --  5.4*  --   --   --   ALBUMIN 2.8* 2.5*  2.6* 2.8* 2.8* 2.9*   No results for input(s): LIPASE, AMYLASE in the last 168 hours. No results for input(s): AMMONIA in the last 168 hours. Coagulation Profile: Recent Labs  Lab 10/26/19 0405  INR 1.2   Cardiac Enzymes: No results for input(s): CKTOTAL, CKMB, CKMBINDEX, TROPONINI in the last 168 hours. BNP (last 3 results) No results for input(s): PROBNP in the last 8760 hours. HbA1C: No results for input(s): HGBA1C in the last 72 hours. CBG: Recent Labs  Lab 10/24/19 0748 10/24/19 1105 10/24/19 1506 10/24/19 2144 10/25/19 0753  GLUCAP 83 95 107* 103* 88   Lipid Profile: No results for input(s): CHOL, HDL, LDLCALC, TRIG, CHOLHDL, LDLDIRECT in the last 72 hours. Thyroid Function Tests: No results for input(s): TSH, T4TOTAL, FREET4, T3FREE, THYROIDAB in the last 72 hours. Anemia Panel: No results for input(s): VITAMINB12, FOLATE, FERRITIN, TIBC, IRON, RETICCTPCT in the last 72 hours. Sepsis Labs: No results for input(s): PROCALCITON, LATICACIDVEN in the last 168 hours.  No results found for this or any previous visit (from the past 240 hour(s)).       Radiology Studies: Dg Chest Port 1 View  Result Date: 10/29/2019 CLINICAL DATA:  Shortness of breath, COVID positive. EXAM: PORTABLE CHEST 1 VIEW COMPARISON:  10/25/2019. FINDINGS: Trachea is midline. Right IJ dialysis catheter tip is in the proximal SVC. Left PICC tip is  at the SVC RA junction. Heart is enlarged, stable. Lungs are hyperinflated with mild basilar interstitial prominence and indistinctness, unchanged. No airspace consolidation or peripheral airspace opacities. No pleural fluid. IMPRESSION: 1. Very mild basilar interstitial prominence and indistinctness is of uncertain acuity. Edema or scarring can have this appearance. 2. No definitive evidence of COVID pneumonia. Electronically Signed   By: Lorin Picket M.D.   On: 10/29/2019 08:32        Scheduled Meds: . allopurinol  200 mg Oral Daily  . atorvastatin  40 mg Oral Daily  . Chlorhexidine Gluconate Cloth  6 each Topical Daily  . Chlorhexidine Gluconate Cloth  6 each Topical Q0600  . heparin  2.4 mL Intravenous Once  . mouth rinse  15 mL Mouth Rinse BID  . metoprolol succinate  12.5 mg Oral BID  . multivitamin with minerals  1 tablet Oral Daily  . vitamin C  500 mg Oral Daily  . zinc sulfate  220 mg Oral Daily   Continuous Infusions: . sodium chloride Stopped (10/25/19 1258)  . sodium chloride Stopped (10/29/19 0351)  . sodium chloride    . heparin 1,050 Units/hr (10/30/19 0607)     LOS: 18 days    Time spent: 35 minutes.     Elmarie Shiley, MD [facility]   If 7PM-7AM, please contact night-coverage [email or web address] Password [facility] 10/30/2019, 9:54 AM

## 2019-10-30 NOTE — Progress Notes (Signed)
ANTICOAGULATION CONSULT NOTE  Pharmacy Consult for Heparin Indication: atrial fibrillation  Patient Measurements: Height: 5\' 3"  (160 cm) Weight: 244 lb 0.8 oz (110.7 kg) IBW/kg (Calculated) : 52.4 Heparin Dosing Weight: 79.8 kg  Vital Signs: Temp: 97.8 F (36.6 C) (12/05 0800) Temp Source: Oral (12/05 0800) BP: 113/77 (12/05 0800) Pulse Rate: 55 (12/05 0800)  Labs: Recent Labs    10/28/19 0716 10/28/19 0718 10/29/19 0618 10/29/19 0619 10/29/19 0906 10/30/19 0203  HGB 9.0*  --   --   --  8.6* 8.9*  HCT 30.3*  --   --   --  28.7* 29.5*  PLT 171  --   --   --  156 155  HEPARINUNFRC  --  0.56  --  0.40  --  0.43  CREATININE 4.52*  --  3.32*  --   --  4.15*   Assessment: 73 yr old female presenting with SOB and weight gain - on apixaban PTA for Afib, on hold for now. Last dose of Eliquis was on 11/8.   Pt was told by outside hospital that she required mitral/tricupsid valve replacement > this surgery was deferred upon transfer to [facility]. Now delayed due to incidentally found to be COVID + s/p treatment.  LE dopplers and V/Q scan neg for PE/DVT. TEE and unsuccessful DCCV attempts 12/1.    Heparin level remains within goal. CBC stable. No bleed issues documented.  Goal of Therapy:  Heparin level 0.3-0.7 units/ml  Monitor platelets by anticoagulation protocol: Yes  Plan:  Continue heparin IV at 1050 units/hr Monitor daily heparin level and CBC, s/sx bleeding Follow-up plans for restarting apixaban if no further procedures needed.  [employer], Pharm.D., BCPS Clinical Pharmacist Clinical phone for 10/30/2019 from 8:30-4:00 is [phone].  **Pharmacist phone directory can now be found on [email or web address] (PW [email or web address]).  Listed under [place].  10/30/2019 10:22 AM

## 2019-10-30 NOTE — Progress Notes (Signed)
Patient ID: Melissa Adams, female   DOB: Mar 07, 1946, 73 y.o.   MRN: [id number] [facility] Progress Note   Assessment/ Plan:   1. Acute kidney Injury on chronic kidney disease stage III: Anuric and without any evidence of renal recovery.  With underlying cardiorenal syndrome and will continue hemodialysis on a TTS schedule with next treatment due today.  We will pursue permanent access/conversion to [facility] next week. 2.  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  Ongoing volume management with hemodialysis.  She also has underlying valvular heart disease and atrial fibrillation. 3.  Atrial fibrillation: Rate controlled and appearing to tolerate current dose of metoprolol without significant hypotension. 4.  Anemia of critical illness: Hemoglobin/hematocrit appears stable without any overt blood loss.  We will continue to follow. 5.  COVID-19 infection: Status post remdesivir and dexamethasone (will need to remain in isolation until 12/8)  Subjective:   Reports to be feeling fair, denies any chest pain or shortness of breath.   Objective:   BP 113/77 (BP Location: Right Leg)   Pulse (!) 55   Temp 97.8 F (36.6 C) (Oral)   Resp 10   Ht 5\' 3"  (1.6 m)   Wt 110.7 kg   SpO2 97%   BMI 43.23 kg/m   Intake/Output Summary (Last 24 hours) at 10/30/2019 1104 Last data filed at 10/30/2019 0930 Gross per 24 hour  Intake 1332.82 ml  Output -  Net 1332.82 ml   Weight change: -0.1 kg  Physical Exam: General: Comfortably sitting up on the side of her bed, talking on phone Cardiovascular: Pulse regular bradycardia, S1 and S2 normal Respiratory: Clear to auscultation, no distinct rales or rhonchi Abdomen: Soft, flat, nontender Extremities: Trace LE edema.  Left arm PICC line.  Temporary right IJ dialysis catheter  Imaging: Dg Chest Port 1 View  Result Date: 10/29/2019 CLINICAL DATA:  Shortness of breath,  COVID positive. EXAM: PORTABLE CHEST 1 VIEW COMPARISON:  10/25/2019. FINDINGS: Trachea is midline. Right IJ dialysis catheter tip is in the proximal SVC. Left PICC tip is at the SVC RA junction. Heart is enlarged, stable. Lungs are hyperinflated with mild basilar interstitial prominence and indistinctness, unchanged. No airspace consolidation or peripheral airspace opacities. No pleural fluid. IMPRESSION: 1. Very mild basilar interstitial prominence and indistinctness is of uncertain acuity. Edema or scarring can have this appearance. 2. No definitive evidence of COVID pneumonia. Electronically Signed   By: Lorin Picket M.D.   On: 10/29/2019 08:32    Labs: BMET Recent Labs  Lab 10/24/19 1600 10/25/19 0353 10/26/19 0407 10/27/19 0402 10/28/19 0716 10/29/19 0618 10/30/19 0203  NA 132* 131* 130* 131* 132* 133* 133*  K 4.4 4.3 4.3 4.1 4.0 3.9 4.3  CL 98 96* 97* 96* 95* 97* 96*  CO2 26 25 22 23 26 27 27   GLUCOSE 99 86 80 75 79 83 87  BUN 7* 12 16 14 23 14 23   CREATININE 1.58* 2.44* 3.54* 3.21* 4.52* 3.32* 4.15*  CALCIUM 8.7* 8.6* 8.8* 8.6* 9.1 8.9 8.8*  PHOS 2.4* 3.0 3.4 3.4 3.6 2.7 2.8   CBC Recent Labs  Lab 10/27/19 0402 10/28/19 0716 10/29/19 0906 10/30/19 0203  WBC 5.1 5.7 5.1 5.5  HGB 9.0* 9.0* 8.6* 8.9*  HCT 29.9* 30.3* 28.7* 29.5*  MCV 82.8 82.3 81.8 82.2  PLT 137* 171 156 155   Medications:    . allopurinol  200 mg Oral Daily  . atorvastatin  40 mg  Oral Daily  . Chlorhexidine Gluconate Cloth  6 each Topical Daily  . Chlorhexidine Gluconate Cloth  6 each Topical Q0600  . mouth rinse  15 mL Mouth Rinse BID  . metoprolol succinate  12.5 mg Oral BID  . multivitamin with minerals  1 tablet Oral Daily  . vitamin C  500 mg Oral Daily  . zinc sulfate  220 mg Oral Daily   Elmarie Shiley, MD 10/30/2019, 11:04 AM

## 2019-10-30 NOTE — Progress Notes (Signed)
Pharmacy Antibiotic Note  Melissa Adams is a 73 y.o. female with HF, AKI/CKD and COVID with concern of PNA. Pharmacy consulted to dose cefepime. She is noted on HD TTS -WBC= 5.5, afebrile, CXR with possible PNA    Plan: -Cefepime 2gm IV x1 then 2gm IV w/ HD TTS -Will follow renal function, cultures and clinical progress    Height: 5\' 3"  (160 cm) Weight: 244 lb 0.8 oz (110.7 kg) IBW/kg (Calculated) : 52.4  Temp (24hrs), Avg:98.1 F (36.7 C), Min:97.8 F (36.6 C), Max:98.8 F (37.1 C)  Recent Labs  Lab 10/26/19 0407 10/27/19 0402 10/28/19 0716 10/29/19 0618 10/29/19 0906 10/30/19 0203  WBC 6.2 5.1 5.7  --  5.1 5.5  CREATININE 3.54* 3.21* 4.52* 3.32*  --  4.15*    Estimated Creatinine Clearance: 14.6 mL/min (A) (by C-G formula based on SCr of 4.15 mg/dL (H)).    No Known Allergies  Antimicrobials this admission: 12/5 cefepime  Dose adjustments this admission:   Microbiology results: 12/4 blood x2  Thank you for allowing pharmacy to be a part of this patient's care.  Hildred Laser, PharmD Clinical Pharmacist **Pharmacist phone directory can now be found on [email or web address] ([email or web address]).  Listed under [place].

## 2019-10-31 LAB — RENAL FUNCTION PANEL
Albumin: 2.8 g/dL — ABNORMAL LOW (ref 3.5–5.0)
Anion gap: 10 (ref 5–15)
BUN: 19 mg/dL (ref 8–23)
CO2: 26 mmol/L (ref 22–32)
Calcium: 8.8 mg/dL — ABNORMAL LOW (ref 8.9–10.3)
Chloride: 98 mmol/L (ref 98–111)
Creatinine, Ser: 3.86 mg/dL — ABNORMAL HIGH (ref 0.44–1.00)
GFR calc Af Amer: 13 mL/min — ABNORMAL LOW (ref >60)
GFR calc non Af Amer: 11 mL/min — ABNORMAL LOW (ref >60)
Glucose, Bld: 89 mg/dL (ref 70–99)
Phosphorus: 2.8 mg/dL (ref 2.5–4.6)
Potassium: 4 mmol/L (ref 3.5–5.1)
Sodium: 134 mmol/L — ABNORMAL LOW (ref 135–145)

## 2019-10-31 LAB — CBC
HCT: 28 % — ABNORMAL LOW (ref 36.0–46.0)
Hemoglobin: 8.3 g/dL — ABNORMAL LOW (ref 12.0–15.0)
MCH: 24.8 pg — ABNORMAL LOW (ref 26.0–34.0)
MCHC: 29.6 g/dL — ABNORMAL LOW (ref 30.0–36.0)
MCV: 83.6 fL (ref 80.0–100.0)
Platelets: 153 10*3/uL (ref 150–400)
RBC: 3.35 MIL/uL — ABNORMAL LOW (ref 3.87–5.11)
RDW: 25.6 % — ABNORMAL HIGH (ref 11.5–15.5)
WBC: 5 10*3/uL (ref 4.0–10.5)
nRBC: 0 % (ref 0.0–0.2)

## 2019-10-31 LAB — COOXEMETRY PANEL
Carboxyhemoglobin: 2 % — ABNORMAL HIGH (ref 0.5–1.5)
Methemoglobin: 1.2 % (ref 0.0–1.5)
O2 Saturation: 74.8 %
Total hemoglobin: 8.6 g/dL — ABNORMAL LOW (ref 12.0–16.0)

## 2019-10-31 LAB — MAGNESIUM: Magnesium: 2.1 mg/dL (ref 1.7–2.4)

## 2019-10-31 LAB — HEPARIN LEVEL (UNFRACTIONATED): Heparin Unfractionated: 0.45 IU/mL (ref 0.30–0.70)

## 2019-10-31 NOTE — Progress Notes (Signed)
ANTICOAGULATION CONSULT NOTE  Pharmacy Consult for Heparin Indication: atrial fibrillation  Patient Measurements: Height: 5\' 3"  (160 cm) Weight: 239 lb 10.2 oz (108.7 kg) IBW/kg (Calculated) : 52.4 Heparin Dosing Weight: 79.8 kg  Vital Signs: Temp: 98.3 F (36.8 C) (12/05 2131) Temp Source: Oral (12/05 2131) BP: 84/61 (12/06 0400) Pulse Rate: 89 (12/06 0400)  Labs: Recent Labs    10/29/19 0618 10/29/19 0619  10/29/19 0906 10/30/19 0203 10/31/19 0319  HGB  --   --    < > 8.6* 8.9* 8.3*  HCT  --   --   --  28.7* 29.5* 28.0*  PLT  --   --   --  156 155 153  HEPARINUNFRC  --  0.40  --   --  0.43 0.45  CREATININE 3.32*  --   --   --  4.15* 3.86*   < > = values in this interval not displayed.   Assessment: 73 yr old female presenting with SOB and weight gain - on apixaban PTA for Afib, on hold for now. Last dose of Eliquis was on 11/8.   Pt was told by outside hospital that she required mitral/tricupsid valve replacement > this surgery was deferred upon transfer to [facility]. Now delayed due to incidentally found to be COVID + s/p treatment.  LE dopplers and V/Q scan neg for PE/DVT. TEE and unsuccessful DCCV attempts on 12/1.  Continues on heparin pending plans for permanent HD access.  Heparin level remains within goal. CBC stable. No bleed issues documented.  Goal of Therapy:  Heparin level 0.3-0.7 units/ml  Monitor platelets by anticoagulation protocol: Yes  Plan:  Continue heparin IV at 1050 units/hr Monitor daily heparin level and CBC, s/sx bleeding Follow-up plans for restarting apixaban if no further procedures needed.  [employer], Pharm.D., BCPS Clinical Pharmacist Clinical phone for 10/31/2019 from 8:30-4:00 is [phone].  **Pharmacist phone directory can now be found on [email or web address] ([email or web address]).  Listed under [place].  10/31/2019 9:01 AM

## 2019-10-31 NOTE — Progress Notes (Signed)
PROGRESS NOTE    Melissa Adams  DUK:[id number] DOB: 1946/11/20 DOA: 10/11/2019 PCP: System, Provider Not In   Brief Narrative: Mrs. Perham is a 73 y.o. F with HF previous EF 40-45%, AF, on Eliquis, HTN, OSA on her to severe MR and severe TR, CKD 3, and obesity who presented with 2 to 3 weeks progressive shortness of breath and 22 lb weight gain.  She had tried doubling her Lasix without improvement, then noted to have elevated creatinine, and so was sent to the ER.  In the ER, BNP elevated, creatinine elevated.  Given Lasix and hospitalist service were asked to evaluate for CHF flare.   Assessment & Plan:   Active Problems:   Acute CHF (congestive heart failure) (HCC)   Atrial fibrillation, chronic (HCC)   Essential hypertension   Sleep apnea   AKI (acute kidney injury) (HCC)   Gout   Normocytic anemia   CHF exacerbation (HCC)   Dyspnea   COVID-19 virus infection   Pressure injury of skin   Acute on chronic systolic heart failure (HCC)   1-Acute on chronic systolic and diastolic congestive heart failure exacerbation: Moderate to severe mitral regurgitation and severe tricuspid regurgitation:  -Patient was a started on diuretics.  Creatinine worsened, repeat echo with ejection fraction down to 20 to 25%, advanced heart failure team consulted and following. -Patient was a started on midodrine and Lasix drip.  Renal function continued to worsen and patient was a started on CRRT and subsequently hemodialysis. -First hemodialysis on 12/1 -Off midodrine.  -fluid manage with HD>  -had HD 12-05  2-Cardiorenal syndrome, chronic kidney disease a stage III: Creatinine increased up to 3.5.  Patient started on CRRT and subsequently transition to hemodialysis. Nephrology following. Had HD 12-03. 12-05  3-Hyponatremia: Tolvaptan  last dose given on 11/22.  A stable monitor.  4-Chronic A fib;  DCCV was unsuccessful.  On heparin gtt.  Amiodarone discontinue by cardiology on 12-02.   Started on metoprolol 12-02. Metoprolol decrease to 12.5 BID  5-Coronavirus infection;  ? Pneumonitis.  Needs to remain in isolation for 21 days, from positive test, until Dec 8th.  Completed Remdesivir 5 days and 3 days Dexamethasone.  COVID-19 Labs  No results for input(s): DDIMER, FERRITIN, LDH, CRP in the last 72 hours.  Lab Results  Component Value Date   SARSCOV2NAA POSITIVE (A) 10/12/2019   SARSCOV2NAA POSITIVE (A) 10/11/2019    6-PNA; Fevers, mild Repeated chest x ray 12-04 negative for PNA>  Blood culture; no growth to date.  Chest x ray 12-05; new hazy opacity in the right lung base.  Check UA>  Started  Cefepime 12-05, check  sputum culture. MRSA PCR>   Anemia of chronic diseases;  Stable.   OSA;  Resume CPAP when off precaution.   HTN; BP has been soft.   Stage II pressure injury right labia Not POA. Local care.   Gout; continue with allopurinol.   Hypotension; overnight, suspect related to hypovolemia post HD. SBP normal this am.   Pressure Injury 10/21/19 Labia Right;Medial Stage II -  Partial thickness loss of dermis presenting as a shallow open ulcer with a red, pink wound bed without slough. (Active)  10/21/19 1200  Location: Labia  Location Orientation: Right;Medial  Staging: Stage II -  Partial thickness loss of dermis presenting as a shallow open ulcer with a red, pink wound bed without slough.  Wound Description (Comments):   Present on Admission: No     Estimated body mass index is 42.45  kg/m as calculated from the following:   Height as of this encounter: 5\' 3"  (1.6 m).   Weight as of this encounter: 108.7 kg.   DVT prophylaxis: on Heparin  Code Status: full code Family Communication: Daughter updated.  Disposition Plan: remain in the hospital for treatment of renal faiure and heart failure.  Consultants:   Cardiology  Nephrology   Procedures:   11/17 renal US -- no hydronephrosis, no medicorenal disease  11/17 VQ scan --  low risk  11/17 echo -- EF 20-25%, TR and MR  11/18 lower extremity US -- no DVT  11/18 PICC placed and milrinone started  11/21 Tolvaptan  11/22 Tolvaptan  11/23 dialyis cather placed CRRT started  11/29 CRRT stopped  11/30 milrinone stopped and transfer OOU  12/1 TEE and DCCV (failed)    Antimicrobials:   Remdesivir 11/17 >> 11/22   Subjective: Report mild hemoptysis.  Breathing ok.  BP check in her leg  Objective: Vitals:   10/31/19 0200 10/31/19 0248 10/31/19 0400 10/31/19 0946  BP: (!) 79/49 100/62 (!) 84/61 124/72  Pulse: 87 80 89 (!) 106  Resp: 17 20 20  (!) 23  Temp:    97.9 F (36.6 C)  TempSrc:    Oral  SpO2: 96% 96% 98% 99%  Weight:      Height:        Intake/Output Summary (Last 24 hours) at 10/31/2019 1346 Last data filed at 10/31/2019 0400 Gross per 24 hour  Intake 1063.01 ml  Output 2000 ml  Net -936.99 ml   Filed Weights   10/30/19 0422 10/30/19 1145 10/30/19 1530  Weight: 110.7 kg 110.7 kg 108.7 kg    Examination:  General exam: NAD Respiratory system: CTA Cardiovascular system: S 1, S 2 RRR Gastrointestinal system: BS present, soft, nt Central nervous system: Alert, following command.s Extremities: Symmetric power Skin: no rashes,.    Data Reviewed: I have personally reviewed following labs and imaging studies  CBC: Recent Labs  Lab 10/27/19 0402 10/28/19 0716 10/29/19 0906 10/30/19 0203 10/31/19 0319  WBC 5.1 5.7 5.1 5.5 5.0  HGB 9.0* 9.0* 8.6* 8.9* 8.3*  HCT 29.9* 30.3* 28.7* 29.5* 28.0*  MCV 82.8 82.3 81.8 82.2 83.6  PLT 137* 171 156 155 387   Basic Metabolic Panel: Recent Labs  Lab 10/27/19 0402 10/28/19 0716 10/29/19 0618 10/29/19 0619 10/30/19 0203 10/31/19 0319  NA 131* 132* 133*  --  133* 134*  K 4.1 4.0 3.9  --  4.3 4.0  CL 96* 95* 97*  --  96* 98  CO2 23 26 27   --  27 26  GLUCOSE 75 79 83  --  87 89  BUN 14 23 14   --  23 19  CREATININE 3.21* 4.52* 3.32*  --  4.15* 3.86*  CALCIUM 8.6* 9.1 8.9   --  8.8* 8.8*  MG 2.4 2.3  --  2.1 2.1 2.1  PHOS 3.4 3.6 2.7  --  2.8 2.8   GFR: Estimated Creatinine Clearance: 15.6 mL/min (A) (by C-G formula based on SCr of 3.86 mg/dL (H)). Liver Function Tests: Recent Labs  Lab 10/27/19 0402 10/28/19 0716 10/29/19 0618 10/30/19 0203 10/31/19 0319  AST 19  --   --   --   --   ALT 21  --   --   --   --   ALKPHOS 138*  --   --   --   --   BILITOT 1.0  --   --   --   --  PROT 5.4*  --   --   --   --   ALBUMIN 2.5*   2.6* 2.8* 2.8* 2.9* 2.8*   No results for input(s): LIPASE, AMYLASE in the last 168 hours. No results for input(s): AMMONIA in the last 168 hours. Coagulation Profile: Recent Labs  Lab 10/26/19 0405  INR 1.2   Cardiac Enzymes: No results for input(s): CKTOTAL, CKMB, CKMBINDEX, TROPONINI in the last 168 hours. BNP (last 3 results) No results for input(s): PROBNP in the last 8760 hours. HbA1C: No results for input(s): HGBA1C in the last 72 hours. CBG: Recent Labs  Lab 10/24/19 1506 10/24/19 2144 10/25/19 0753  GLUCAP 107* 103* 88   Lipid Profile: No results for input(s): CHOL, HDL, LDLCALC, TRIG, CHOLHDL, LDLDIRECT in the last 72 hours. Thyroid Function Tests: No results for input(s): TSH, T4TOTAL, FREET4, T3FREE, THYROIDAB in the last 72 hours. Anemia Panel: No results for input(s): VITAMINB12, FOLATE, FERRITIN, TIBC, IRON, RETICCTPCT in the last 72 hours. Sepsis Labs: No results for input(s): PROCALCITON, LATICACIDVEN in the last 168 hours.  Recent Results (from the past 240 hour(s))  Culture, blood (routine x 2)     Status: None (Preliminary result)   Collection Time: 10/29/19 10:06 AM   Specimen: BLOOD LEFT HAND  Result Value Ref Range Status   Specimen Description BLOOD LEFT HAND  Final   Special Requests   Final    BOTTLES DRAWN AEROBIC AND ANAEROBIC Blood Culture adequate volume   Culture   Final    NO GROWTH 2 DAYS Performed at [facility], [address], [place], [place] [address]    Report  Status PENDING  Incomplete  Culture, blood (routine x 2)     Status: None (Preliminary result)   Collection Time: 10/29/19 10:07 AM   Specimen: BLOOD LEFT ARM  Result Value Ref Range Status   Specimen Description BLOOD LEFT ARM  Final   Special Requests   Final    BOTTLES DRAWN AEROBIC AND ANAEROBIC Blood Culture adequate volume   Culture   Final    NO GROWTH 2 DAYS Performed at [facility], [place] [address], [place], [place] [address]    Report Status PENDING  Incomplete         Radiology Studies: Dg Chest Port 1 View  Result Date: 10/30/2019 CLINICAL DATA:  Hemoptysis.  COVID positive. EXAM: PORTABLE CHEST 1 VIEW COMPARISON:  10/29/2019 FINDINGS: Right IJ catheter tip projects over the SVC. Aortic atherosclerosis and cardiac enlargement. Lungs appear hyperinflated. Hazy lung opacity is identified at the right base, new from previous exam. IMPRESSION: 1. New hazy opacity is identified within the right lung base which may represent pneumonia. Electronically Signed   By: Kerby Moors M.D.   On: 10/30/2019 12:15        Scheduled Meds:  allopurinol  200 mg Oral Daily   atorvastatin  40 mg Oral Daily   Chlorhexidine Gluconate Cloth  6 each Topical Daily   Chlorhexidine Gluconate Cloth  6 each Topical Q0600   mouth rinse  15 mL Mouth Rinse BID   metoprolol succinate  12.5 mg Oral BID   multivitamin with minerals  1 tablet Oral Daily   vitamin C  500 mg Oral Daily   zinc sulfate  220 mg Oral Daily   Continuous Infusions:  sodium chloride Stopped (10/25/19 1258)   sodium chloride 10 mL/hr at 10/30/19 1750   sodium chloride     [START ON 11/02/2019] ceFEPime (MAXIPIME) IV  heparin 1,050 Units/hr (10/31/19 0723)     LOS: 19 days    Time spent: 35 minutes.     Elmarie Shiley, MD [facility]   If 7PM-7AM, please contact night-coverage [email or web address] Password [email or web address] 10/31/2019, 1:46 PM

## 2019-10-31 NOTE — Progress Notes (Addendum)
Patient's blood pressure consistently hypotensive tonight. Patient says she normally has a low blood pressure and has been asymptomatic. Pressures are being taken on the legs due to both arms restricted. Paged NP [place] when BP 70/53, given order for 250 mL bolus. Right after fluids were given, the patient got up and used the bathroom without any difficulties or assistance. Says she still "feels normal." Repeat blood pressures still below 80 systolic. Patient currently awake and resting comfortably in bed, asymptomatic, no complaints.

## 2019-11-01 LAB — CBC
HCT: 28.8 % — ABNORMAL LOW (ref 36.0–46.0)
Hemoglobin: 8.8 g/dL — ABNORMAL LOW (ref 12.0–15.0)
MCH: 25.2 pg — ABNORMAL LOW (ref 26.0–34.0)
MCHC: 30.6 g/dL (ref 30.0–36.0)
MCV: 82.5 fL (ref 80.0–100.0)
Platelets: 168 10*3/uL (ref 150–400)
RBC: 3.49 MIL/uL — ABNORMAL LOW (ref 3.87–5.11)
RDW: 25.9 % — ABNORMAL HIGH (ref 11.5–15.5)
WBC: 5.6 10*3/uL (ref 4.0–10.5)
nRBC: 0 % (ref 0.0–0.2)

## 2019-11-01 LAB — RENAL FUNCTION PANEL
Albumin: 3 g/dL — ABNORMAL LOW (ref 3.5–5.0)
Anion gap: 11 (ref 5–15)
BUN: 27 mg/dL — ABNORMAL HIGH (ref 8–23)
CO2: 27 mmol/L (ref 22–32)
Calcium: 9.3 mg/dL (ref 8.9–10.3)
Chloride: 97 mmol/L — ABNORMAL LOW (ref 98–111)
Creatinine, Ser: 4.89 mg/dL — ABNORMAL HIGH (ref 0.44–1.00)
GFR calc Af Amer: 10 mL/min — ABNORMAL LOW (ref >60)
GFR calc non Af Amer: 8 mL/min — ABNORMAL LOW (ref >60)
Glucose, Bld: 87 mg/dL (ref 70–99)
Phosphorus: 2.9 mg/dL (ref 2.5–4.6)
Potassium: 4.6 mmol/L (ref 3.5–5.1)
Sodium: 135 mmol/L (ref 135–145)

## 2019-11-01 LAB — MAGNESIUM: Magnesium: 2.1 mg/dL (ref 1.7–2.4)

## 2019-11-01 LAB — HEPARIN LEVEL (UNFRACTIONATED): Heparin Unfractionated: 0.5 IU/mL (ref 0.30–0.70)

## 2019-11-01 LAB — MRSA PCR SCREENING: MRSA by PCR: NEGATIVE

## 2019-11-01 MED ORDER — NEPRO/CARBSTEADY PO LIQD
237.0000 mL | ORAL | Status: DC
  Administered 2019-11-03 – 2019-11-05 (×2): 237 mL via ORAL

## 2019-11-01 MED ORDER — NEPRO/CARBSTEADY PO LIQD: 237.0000 mL | ORAL | Status: DC

## 2019-11-01 MED ORDER — RENA-VITE PO TABS
1.0000 | ORAL_TABLET | Freq: Every day | ORAL | Status: DC
  Administered 2019-11-02 – 2019-11-04 (×3): 1 via ORAL
  Filled 2019-11-01 (×3): qty 1

## 2019-11-01 NOTE — Progress Notes (Signed)
ANTICOAGULATION CONSULT NOTE - Follow Up Consult  Pharmacy Consult for Heparin Indication: atrial fibrillation  No Known Allergies  Patient Measurements: Height: 5\' 3"  (160 cm) Weight: 239 lb 10.2 oz (108.7 kg) IBW/kg (Calculated) : 52.4  Vital Signs: Temp: 97.9 F (36.6 C) (12/07 0400) Temp Source: Oral (12/07 0020) BP: 119/62 (12/07 0106) Pulse Rate: 68 (12/07 0400)  Labs: Recent Labs    10/30/19 0203 10/31/19 0319 11/01/19 0400  HGB 8.9* 8.3* 8.8*  HCT 29.5* 28.0* 28.8*  PLT 155 153 168  HEPARINUNFRC 0.43 0.45 0.50  CREATININE 4.15* 3.86* 4.89*    Estimated Creatinine Clearance: 12.3 mL/min (A) (by C-G formula based on SCr of 4.89 mg/dL (H)).   Assessment:  Anticoag: Heparin for AF - Apixaban PTA (LD 11/8), Afib w/ valvular dz - no plans for surgery at this time per cards. Continues on heparin pending perm HD access. - V/Q neg for PE, LE dopplers neg - HL 0.5 on 1050 units/hr, Hgb 8.8 and plts stable.  Goal of Therapy:  Heparin level 0.3-0.7 units/ml Monitor platelets by anticoagulation protocol: Yes    Plan:  Continue heparin IV at 1050 units/hr Monitor daily heparin level and CBC, s/sx bleeding F/u re-start PTA apixaban after access obtained   Corena Tilson S. Alford Highland, PharmD, BCPS Clinical Staff Pharmacist Eilene Ghazi [place] 11/01/2019,8:23 AM

## 2019-11-01 NOTE — Progress Notes (Signed)
Chaplain is aware of need for AD. Due to Jasemine being COVID positive Chaplain is restricted with resources in the current moment. Chaplain's office is working to see how notaries and witnesses may be present and safe at the same time. Chaplain will contact the unit as soon as a solution is discovered.   Chaplain Resident, Evelene Croon, [place] [phone]

## 2019-11-01 NOTE — Progress Notes (Signed)
Initial Nutrition Assessment  RD working remotely.  DOCUMENTATION CODES:   Morbid obesity  INTERVENTION:   - Nepro Shake po daily with breakfast, each supplement provides 425 kcal and 19 grams protein  - Switch daily MVI with minerals to renal MVI  - Encourage continued adequate PO intake  NUTRITION DIAGNOSIS:   Increased nutrient needs related to acute illness (COVID-19) as evidenced by estimated needs.  GOAL:   Patient will meet greater than or equal to 90% of their needs  MONITOR:   PO intake, Supplement acceptance, Labs, Weight trends, Skin, I & O's  REASON FOR ASSESSMENT:   LOS    ASSESSMENT:   73 year old female who presented to the ED on 11/16 with LE edema and SOB. PMH of atrial fibrillation, cardiomyopathy, CHF, HTN. Pt admitted with acute systolic CHF exacerbation and AKI on CKD III. Pt also tested positive for COVID-19.   11/19 - PICC line placed 11/23 - CRRT initiated 11/29 - CRRT d/c 12/01 - TEE, cardioversion, first iHD  Per notes, pt now appears to be HD-dependent. Plan is to pursue permanent access/conversion to [facility] this week.  Weight down a total of 10 lbs since admit. Suspect weight loss related to negative fluid balance.  Spoke with pt at bedside who reports her appetite is improving and she is eating well. Pt states that at most meals she eats almost all of her food. Pt states that if she eats too much, she feels abdominal cramps.  Pt denies any N/V and denies issues with BM. Pt reports having a BM this AM.  Pt states that "breakfast doesn't agree with me." Pt amenable to drinking Nepro Shake if she does not eat well at breakfast. RD to order.  Per RN edema assessment, pt with mild pitting edema to BUE and deep pitting edema to BLE.  12/05 net UF: 2000 ml, post-HD weight 108.7 kg  Meal Completion: 50-100% x last 8 meals (averaging 86%)  Medications reviewed and include: MVI with minerals, vitamin C, zinc sulfate, IV abx, heparin  Labs  reviewed: BUN 27, creatinine 4.89, hemoglobin 8.8  I/O's: -14.7 L since admit  NUTRITION - FOCUSED PHYSICAL EXAM:  Unable to complete at this time. RD working remotely.  Diet Order:   Diet Order            Diet Heart Room service appropriate? Yes; Fluid consistency: Thin; Fluid restriction: 1200 mL Fluid  Diet effective now              EDUCATION NEEDS:   Education needs have been addressed  Skin:  Skin Assessment: Skin Integrity Issues: Stage II: right labia  Last BM:  10/29/19  Height:   Ht Readings from Last 1 Encounters:  10/26/19 5\' 3"  (1.6 m)    Weight:   Wt Readings from Last 1 Encounters:  10/30/19 108.7 kg    Ideal Body Weight:  52.3 kg  BMI:  Body mass index is 42.45 kg/m.  Estimated Nutritional Needs:   Kcal:  1900-2100  Protein:  15-120 grams  Fluid:  UOP + 1000 ml    [name], MS, RD, LDN Inpatient Clinical Dietitian Pager: [phone] Weekend/After Hours: [phone]

## 2019-11-01 NOTE — Progress Notes (Signed)
Physical Therapy Treatment Patient Details Name: Melissa Adams MRN: [id number] DOB: [birth year] Today's Date: 11/01/2019    History of Present Illness 73 y.o. female with a medical history of heart failure, atrial fibrillation, hypertension, sleep apnea, who presented to the emergency department with complaints of shortness of breath and weight gain. 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 In ED found to have an elevated BNP with dyspnea on exertion. CXR showed small right pleural effusion with bibasilar atelectasis. Admitted 10/11/19 Cardiorenal syndrome-was on CRRT from 11/23-11/29,    PT Comments    Pt making excellent progress with mobility.    Follow Up Recommendations  Home health PT;Supervision - Intermittent     Equipment Recommendations  Rolling walker with 5" wheels;Other (comment)(vs rollator)    Recommendations for Other Services       Precautions / Restrictions Precautions Precautions: Fall Restrictions Weight Bearing Restrictions: No    Mobility  Bed Mobility               General bed mobility comments: Pt up in chair  Transfers Overall transfer level: Needs assistance Equipment used: None Transfers: Sit to/from Stand Sit to Stand: Supervision         General transfer comment: supervision for lines and safety  Ambulation/Gait Ambulation/Gait assistance: Min guard Gait Distance (Feet): 225 Feet Assistive device: Rolling walker (2 wheeled);None Gait Pattern/deviations: Step-through pattern;Decreased stride length Gait velocity: decr Gait velocity interpretation: 1.31 - 2.62 ft/sec, indicative of limited community ambulator General Gait Details: assist for lines and to negotiate tight spaces with lines   Stairs             Wheelchair Mobility    Modified Rankin (Stroke Patients Only)       Balance Overall balance assessment: Mild deficits observed, not formally tested                                           Cognition Arousal/Alertness: Awake/alert Behavior During Therapy: WFL for tasks assessed/performed Overall Cognitive Status: Within Functional Limits for tasks assessed                                        Exercises      General Comments General comments (skin integrity, edema, etc.): SpO2 >91% on RA      Pertinent Vitals/Pain Pain Assessment: No/denies pain    Home Living                      Prior Function            PT Goals (current goals can now be found in the care plan section) Acute Rehab PT Goals Patient Stated Goal: go home PT Goal Formulation: With patient Time For Goal Achievement: 11/15/19 Potential to Achieve Goals: Good Progress towards PT goals: Progressing toward goals;Goals met and updated - see care plan    Frequency    Min 3X/week      PT Plan Current plan remains appropriate    Co-evaluation              AM-PAC PT "6 Clicks" Mobility   Outcome Measure  Help needed turning from your back to your side while in a flat bed without using bedrails?: None Help needed moving from lying on your back  to sitting on the side of a flat bed without using bedrails?: A Little Help needed moving to and from a bed to a chair (including a wheelchair)?: A Little Help needed standing up from a chair using your arms (e.g., wheelchair or bedside chair)?: A Little Help needed to walk in hospital room?: A Little Help needed climbing 3-5 steps with a railing? : A Lot 6 Click Score: 18    End of Session   Activity Tolerance: Patient tolerated treatment well Patient left: with call bell/phone within reach;in chair Nurse Communication: Mobility status PT Visit Diagnosis: Unsteadiness on feet (R26.81);Other abnormalities of gait and mobility (R26.89);Muscle weakness (generalized) (M62.81);History of falling (Z91.81) Pain - Right/Left: Right Pain - part of body: (flank)     Time: 0037-9444 PT Time  Calculation (min) (ACUTE ONLY): 17 min  Charges:  $Gait Training: 8-22 mins                     [place] Pager [phone] Office [place] 11/01/2019, 5:46 PM

## 2019-11-01 NOTE — Progress Notes (Signed)
Patient ID: Melissa Adams, female   DOB: 1946/09/12, 73 y.o.   MRN: [id number] [facility] Progress Note   Assessment/ Plan:   1. Acute kidney Injury on chronic kidney disease stage III: Anuric and without any evidence of renal recovery.  Currently getting dialysis via temporary catheter and I will consult with vascular surgery following discontinuation of her isolation for conversion to tunneled catheter/permanent access.  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  Dialysis ordered for tomorrow with attempt to limit hypotension. 2.  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  Ongoing volume management with hemodialysis.  She also has underlying valvular heart disease and atrial fibrillation. 3.  Atrial fibrillation: Rate controlled and appearing to tolerate current dose of metoprolol without significant hypotension. 4.  Anemia of critical illness: Hemoglobin/hematocrit appears stable without any overt blood loss.  We will continue to follow. 5.  COVID-19 infection: Status post remdesivir and dexamethasone (will need to remain in isolation until 12/8)  Subjective:   Reports to be feeling fair, denies any chest pain or shortness of breath.   Objective:   BP 119/62 (BP Location: Right Leg)   Pulse 68   Temp 97.9 F (36.6 C)   Resp (!) 21   Ht 5\' 3"  (1.6 m)   Wt 108.7 kg   SpO2 100%   BMI 42.45 kg/m   Intake/Output Summary (Last 24 hours) at 11/01/2019 1226 Last data filed at 11/01/2019 0400 Gross per 24 hour  Intake 471.87 ml  Output -  Net 471.87 ml   Weight change:   Physical Exam: Patient not personally examined due to COVID-19 status and need to limit contact exposure-pertinent findings discussed and reviewed with Dr. Tyrell Antonio  Imaging: No results found.  Labs: BMET Recent Labs  Lab 10/26/19 0407  10/27/19 0402 10/28/19 0716 10/29/19 0618 10/30/19 0203 10/31/19 0319 11/01/19 0400  NA 130* 131* 132* 133* 133* 134* 135  K 4.3 4.1 4.0 3.9 4.3 4.0 4.6  CL 97* 96* 95* 97* 96* 98 97*  CO2 22 23 26 27 27 26 27   GLUCOSE 80 75 79 83 87 89 87  BUN 16 14 23 14 23 19  27*  CREATININE 3.54* 3.21* 4.52* 3.32* 4.15* 3.86* 4.89*  CALCIUM 8.8* 8.6* 9.1 8.9 8.8* 8.8* 9.3  PHOS 3.4 3.4 3.6 2.7 2.8 2.8 2.9   CBC Recent Labs  Lab 10/29/19 0906 10/30/19 0203 10/31/19 0319 11/01/19 0400  WBC 5.1 5.5 5.0 5.6  HGB 8.6* 8.9* 8.3* 8.8*  HCT 28.7* 29.5* 28.0* 28.8*  MCV 81.8 82.2 83.6 82.5  PLT 156 155 153 168   Medications:    . allopurinol  200 mg Oral Daily  . atorvastatin  40 mg Oral Daily  . Chlorhexidine Gluconate Cloth  6 each Topical Daily  . Chlorhexidine Gluconate Cloth  6 each Topical Q0600  . [START ON 11/02/2019] feeding supplement (NEPRO CARB STEADY)  237 mL Oral Q24H  . mouth rinse  15 mL Mouth Rinse BID  . metoprolol succinate  12.5 mg Oral BID  . [START ON 11/02/2019] multivitamin  1 tablet Oral QHS  . vitamin C  500 mg Oral Daily  . zinc sulfate  220 mg Oral Daily   Elmarie Shiley, MD 11/01/2019, 12:26 PM

## 2019-11-01 NOTE — Progress Notes (Signed)
PROGRESS NOTE    Melissa Adams  WCH:[id number] DOB: [birth year] DOA: 10/11/2019 PCP: System, Provider Not In   Brief Narrative: Melissa Adams is a 73 y.o. F with HF previous EF 40-45%, AF, on Eliquis, HTN, OSA on her to severe MR and severe TR, CKD 3, and obesity who presented with 2 to 3 weeks progressive shortness of breath and 22 lb weight gain.  She had tried doubling her Lasix without improvement, then noted to have elevated creatinine, and so was sent to the ER.  In the ER, BNP elevated, creatinine elevated.  Given Lasix and hospitalist service were asked to evaluate for CHF flare.   Assessment & Plan:   Active Problems:   Acute CHF (congestive heart failure) (HCC)   Atrial fibrillation, chronic (HCC)   Essential hypertension   Sleep apnea   AKI (acute kidney injury) (HCC)   Gout   Normocytic anemia   CHF exacerbation (HCC)   Dyspnea   COVID-19 virus infection   Pressure injury of skin   Acute on chronic systolic heart failure (HCC)   1-Acute on chronic systolic and diastolic congestive heart failure exacerbation: Moderate to severe mitral regurgitation and severe tricuspid regurgitation:  -Patient was a started on diuretics.  Creatinine worsened, repeat echo with ejection fraction down to 20 to 25%, advanced heart failure team consulted and following. -Patient was a started on midodrine and Lasix drip.  Renal function continued to worsen and patient was a started on CRRT and subsequently hemodialysis. -First hemodialysis on 12/1 -Off midodrine.  -fluid manage with HD>  -had HD 12-05, next HD tomorrow.   2-Cardiorenal syndrome, chronic kidney disease a stage III: Creatinine increased up to 3.5.  Patient started on CRRT and subsequently transition to hemodialysis. Nephrology following. Had HD 12-03. 12-05  3-Hyponatremia: Tolvaptan  last dose given on 11/22.  A stable monitor.  4-Chronic A fib;  DCCV was unsuccessful.  On heparin gtt.  Amiodarone discontinue by  cardiology on 12-02.  Started on metoprolol 12-02. Metoprolol decrease to 12.5 BID  5-Coronavirus infection;  ? Pneumonitis.  Needs to remain in isolation for 21 days, from positive test, until Dec 8th.  Completed Remdesivir 5 days and 3 days Dexamethasone.  COVID-19 Labs  No results for input(s): DDIMER, FERRITIN, LDH, CRP in the last 72 hours.  Lab Results  Component Value Date   SARSCOV2NAA POSITIVE (A) 10/12/2019   SARSCOV2NAA POSITIVE (A) 10/11/2019    6-PNA; Fevers, mild Repeated chest x ray 12-04 negative for PNA>  Blood culture; no growth to date.  Chest x ray 12-05; new hazy opacity in the right lung base.  Check UA>  Started  Cefepime 12-05, check  sputum culture. MRSA PCR>   Anemia of chronic diseases;  Stable.   OSA;  Resume CPAP when off precaution.   HTN; BP has been soft.   Stage II pressure injury right labia Not POA. Local care.   Gout; continue with allopurinol.   Hypotension; overnight 12-06, suspect related to hypovolemia post HD. SBP normal this am.  BP improved.   Pressure Injury 10/21/19 Labia Right;Medial Stage II -  Partial thickness loss of dermis presenting as a shallow open ulcer with a red, pink wound bed without slough. (Active)  10/21/19 1200  Location: Labia  Location Orientation: Right;Medial  Staging: Stage II -  Partial thickness loss of dermis presenting as a shallow open ulcer with a red, pink wound bed without slough.  Wound Description (Comments):   Present on Admission: No  Estimated body mass index is 42.45 kg/m as calculated from the following:   Height as of this encounter: 5\' 3"  (1.6 m).   Weight as of this encounter: 108.7 kg.   DVT prophylaxis: on Heparin  Code Status: full code Family Communication: Daughter updated.  Disposition Plan: remain in the hospital for treatment of renal faiure and heart failure.  Consultants:   Cardiology  Nephrology   Procedures:   11/17 renal US -- no hydronephrosis, no  medicorenal disease  11/17 VQ scan -- low risk  11/17 echo -- EF 20-25%, TR and MR  11/18 lower extremity US -- no DVT  11/18 PICC placed and milrinone started  11/21 Tolvaptan  11/22 Tolvaptan  11/23 dialyis cather placed CRRT started  11/29 CRRT stopped  11/30 milrinone stopped and transfer OOU  12/1 TEE and DCCV (failed)    Antimicrobials:   Remdesivir 11/17 >> 11/22   Subjective: Feeling well , no new complaints.  Objective: Vitals:   10/31/19 2245 11/01/19 0020 11/01/19 0106 11/01/19 0400  BP: (!) 124/101  119/62   Pulse: 90  91 68  Resp: (!) 21     Temp:  98.1 F (36.7 C)  97.9 F (36.6 C)  TempSrc:  Oral    SpO2: 100%     Weight:      Height:        Intake/Output Summary (Last 24 hours) at 11/01/2019 1317 Last data filed at 11/01/2019 0400 Gross per 24 hour  Intake 471.87 ml  Output -  Net 471.87 ml   Filed Weights   10/30/19 0422 10/30/19 1145 10/30/19 1530  Weight: 110.7 kg 110.7 kg 108.7 kg    Examination:  General exam: NAD Respiratory system: CTA Cardiovascular system: S 1, S 2 RRR Gastrointestinal system: BS present, soft, nt Central nervous system: Alert, Non focal.  Extremities: Symmetric power Skin: no rashes,.    Data Reviewed: I have personally reviewed following labs and imaging studies  CBC: Recent Labs  Lab 10/28/19 0716 10/29/19 0906 10/30/19 0203 10/31/19 0319 11/01/19 0400  WBC 5.7 5.1 5.5 5.0 5.6  HGB 9.0* 8.6* 8.9* 8.3* 8.8*  HCT 30.3* 28.7* 29.5* 28.0* 28.8*  MCV 82.3 81.8 82.2 83.6 82.5  PLT 171 156 155 153 643   Basic Metabolic Panel: Recent Labs  Lab 10/28/19 0716 10/29/19 0618 10/29/19 0619 10/30/19 0203 10/31/19 0319 11/01/19 0400  NA 132* 133*  --  133* 134* 135  K 4.0 3.9  --  4.3 4.0 4.6  CL 95* 97*  --  96* 98 97*  CO2 26 27  --  27 26 27   GLUCOSE 79 83  --  87 89 87  BUN 23 14  --  23 19 27*  CREATININE 4.52* 3.32*  --  4.15* 3.86* 4.89*  CALCIUM 9.1 8.9  --  8.8* 8.8* 9.3  MG 2.3   --  2.1 2.1 2.1 2.1  PHOS 3.6 2.7  --  2.8 2.8 2.9   GFR: Estimated Creatinine Clearance: 12.3 mL/min (A) (by C-G formula based on SCr of 4.89 mg/dL (H)). Liver Function Tests: Recent Labs  Lab 10/27/19 0402 10/28/19 0716 10/29/19 0618 10/30/19 0203 10/31/19 0319 11/01/19 0400  AST 19  --   --   --   --   --   ALT 21  --   --   --   --   --   ALKPHOS 138*  --   --   --   --   --  BILITOT 1.0  --   --   --   --   --   PROT 5.4*  --   --   --   --   --   ALBUMIN 2.5*  2.6* 2.8* 2.8* 2.9* 2.8* 3.0*   No results for input(s): LIPASE, AMYLASE in the last 168 hours. No results for input(s): AMMONIA in the last 168 hours. Coagulation Profile: Recent Labs  Lab 10/26/19 0405  INR 1.2   Cardiac Enzymes: No results for input(s): CKTOTAL, CKMB, CKMBINDEX, TROPONINI in the last 168 hours. BNP (last 3 results) No results for input(s): PROBNP in the last 8760 hours. HbA1C: No results for input(s): HGBA1C in the last 72 hours. CBG: No results for input(s): GLUCAP in the last 168 hours. Lipid Profile: No results for input(s): CHOL, HDL, LDLCALC, TRIG, CHOLHDL, LDLDIRECT in the last 72 hours. Thyroid Function Tests: No results for input(s): TSH, T4TOTAL, FREET4, T3FREE, THYROIDAB in the last 72 hours. Anemia Panel: No results for input(s): VITAMINB12, FOLATE, FERRITIN, TIBC, IRON, RETICCTPCT in the last 72 hours. Sepsis Labs: No results for input(s): PROCALCITON, LATICACIDVEN in the last 168 hours.  Recent Results (from the past 240 hour(s))  Culture, blood (routine x 2)     Status: None (Preliminary result)   Collection Time: 10/29/19 10:06 AM   Specimen: BLOOD LEFT HAND  Result Value Ref Range Status   Specimen Description BLOOD LEFT HAND  Final   Special Requests   Final    BOTTLES DRAWN AEROBIC AND ANAEROBIC Blood Culture adequate volume   Culture   Final    NO GROWTH 2 DAYS Performed at [facility], [address] [place], [place] [address]    Report Status  PENDING  Incomplete  Culture, blood (routine x 2)     Status: None (Preliminary result)   Collection Time: 10/29/19 10:07 AM   Specimen: BLOOD LEFT ARM  Result Value Ref Range Status   Specimen Description BLOOD LEFT ARM  Final   Special Requests   Final    BOTTLES DRAWN AEROBIC AND ANAEROBIC Blood Culture adequate volume   Culture   Final    NO GROWTH 2 DAYS Performed at [facility], [place] [address], [place], [place] [address]    Report Status PENDING  Incomplete         Radiology Studies: No results found.      Scheduled Meds: . allopurinol  200 mg Oral Daily  . atorvastatin  40 mg Oral Daily  . Chlorhexidine Gluconate Cloth  6 each Topical Daily  . Chlorhexidine Gluconate Cloth  6 each Topical Q0600  . [START ON 11/02/2019] feeding supplement (NEPRO CARB STEADY)  237 mL Oral Q24H  . mouth rinse  15 mL Mouth Rinse BID  . metoprolol succinate  12.5 mg Oral BID  . [START ON 11/02/2019] multivitamin  1 tablet Oral QHS  . vitamin C  500 mg Oral Daily  . zinc sulfate  220 mg Oral Daily   Continuous Infusions: . sodium chloride Stopped (10/25/19 1258)  . sodium chloride 10 mL/hr at 10/30/19 1750  . sodium chloride    . [START ON 11/02/2019] ceFEPime (MAXIPIME) IV    . heparin 1,050 Units/hr (11/01/19 0903)     LOS: 20 days    Time spent: 35 minutes.     Elmarie Shiley, MD [facility]   If 7PM-7AM, please contact night-coverage [email or web address] Password [email or web address] 11/01/2019, 1:17 PM

## 2019-11-01 NOTE — TOC Initial Note (Signed)
Transition of Care [facility]) - Initial/Assessment Note    Patient Details  Name: Melissa Adams MRN: [id number] Date of Birth: 20-Oct-1946  [facility] CM/SW Contact:    Maryclare Labrador, RN Phone Number: 11/01/2019, 10:10 AM  Clinical Narrative:     Pt is independent from home alone. Pt is from [place] and became sick while visiting daughter.   CM unable to reach pt - CM called mobile number and reached pts daughter.  Daughter informed CM that she is in the process of getting her mom to move to [place].   Renal still trying to determine if pt will need outpt HD - pt will get tunnel cath this week. Renal Coordinator aware of pt - possibility that pt may need clipping in [place].   Daughter to discuss plan with pt to move pt to [facility] Tuesday 12/8.   Daughter informed CM that her mom has requested to have POA paperwork completed so daughter can make medical decisions - CM consulted chaplain services        Expected Discharge Plan: Home w Home Health Services Barriers to Discharge: Other (comment)(Pt will likely need clipping)   Patient Goals and CMS Choice        Expected Discharge Plan and Services Expected Discharge Plan: [place]       Living arrangements for the past 2 months: Single Family Home                                      Prior Living Arrangements/Services Living arrangements for the past 2 months: Single Family Home Lives with:: Self Patient language and need for interpreter reviewed:: Yes        Need for Family Participation in Patient Care: Yes (Comment) Care giver support system in place?: Yes (comment)(If pt discharges to daughter home in [place] she will have the support)   Criminal Activity/Legal Involvement Pertinent to Current Situation/Hospitalization: No - Comment as needed  Activities of Daily Living Home Assistive Devices/Equipment: Cane (specify quad or straight)(straight) ADL Screening (condition  at time of admission) Patient's cognitive ability adequate to safely complete daily activities?: Yes Is the patient deaf or have difficulty hearing?: No Does the patient have difficulty seeing, even when wearing glasses/contacts?: No Does the patient have difficulty concentrating, remembering, or making decisions?: No Patient able to express need for assistance with ADLs?: Yes Does the patient have difficulty dressing or bathing?: No Independently performs ADLs?: Yes (appropriate for developmental age) Does the patient have difficulty walking or climbing stairs?: No Weakness of Legs: None Weakness of Arms/Hands: None  Permission Sought/Granted                  Emotional Assessment           Psych Involvement: Outpatient Provider  Admission diagnosis:  Dyspnea, unspecified type [R06.00] Acute on chronic congestive heart failure, unspecified heart failure type ([place]) [I50.9] CHF exacerbation ([place]) [I50.9] Patient Active Problem List   Diagnosis Date Noted  . Acute on chronic systolic heart failure ([place])   . Pressure injury of skin 10/22/2019  . Dyspnea   . COVID-19 virus infection   . CHF exacerbation ([place]) 10/12/2019  . Acute CHF (congestive heart failure) ([place]) 10/11/2019  . Atrial fibrillation, chronic ([place]) 10/11/2019  . Essential hypertension 10/11/2019  . Sleep apnea 10/11/2019  . AKI (acute kidney injury) ([place]) 10/11/2019  . Gout 10/11/2019  .  Normocytic anemia 10/11/2019   PCP:  System, Provider Not In Pharmacy:   [facility], [address] [place] [address] Phone: [phone] Fax: [phone]     Social Determinants of Health (SDOH) Interventions    Readmission Risk Interventions No flowsheet data found.

## 2019-11-02 LAB — CBC
HCT: 30.3 % — ABNORMAL LOW (ref 36.0–46.0)
Hemoglobin: 9.1 g/dL — ABNORMAL LOW (ref 12.0–15.0)
MCH: 25.3 pg — ABNORMAL LOW (ref 26.0–34.0)
MCHC: 30 g/dL (ref 30.0–36.0)
MCV: 84.2 fL (ref 80.0–100.0)
Platelets: 171 10*3/uL (ref 150–400)
RBC: 3.6 MIL/uL — ABNORMAL LOW (ref 3.87–5.11)
RDW: 26.1 % — ABNORMAL HIGH (ref 11.5–15.5)
WBC: 4.8 10*3/uL (ref 4.0–10.5)
nRBC: 0 % (ref 0.0–0.2)

## 2019-11-02 LAB — RENAL FUNCTION PANEL
Albumin: 2.9 g/dL — ABNORMAL LOW (ref 3.5–5.0)
Anion gap: 14 (ref 5–15)
BUN: 32 mg/dL — ABNORMAL HIGH (ref 8–23)
CO2: 24 mmol/L (ref 22–32)
Calcium: 9.5 mg/dL (ref 8.9–10.3)
Chloride: 96 mmol/L — ABNORMAL LOW (ref 98–111)
Creatinine, Ser: 5.58 mg/dL — ABNORMAL HIGH (ref 0.44–1.00)
GFR calc Af Amer: 8 mL/min — ABNORMAL LOW (ref >60)
GFR calc non Af Amer: 7 mL/min — ABNORMAL LOW (ref >60)
Glucose, Bld: 140 mg/dL — ABNORMAL HIGH (ref 70–99)
Phosphorus: 3.3 mg/dL (ref 2.5–4.6)
Potassium: 4.3 mmol/L (ref 3.5–5.1)
Sodium: 134 mmol/L — ABNORMAL LOW (ref 135–145)

## 2019-11-02 LAB — HEPARIN LEVEL (UNFRACTIONATED): Heparin Unfractionated: 0.37 IU/mL (ref 0.30–0.70)

## 2019-11-02 LAB — MAGNESIUM: Magnesium: 2.2 mg/dL (ref 1.7–2.4)

## 2019-11-02 MED ORDER — HEPARIN SODIUM (PORCINE) 1000 UNIT/ML IJ SOLN
INTRAMUSCULAR | Status: AC
  Administered 2019-11-02: 2400 [IU] via INTRAVENOUS_CENTRAL
  Filled 2019-11-02: qty 3

## 2019-11-02 MED ORDER — HEPARIN SODIUM (PORCINE) 1000 UNIT/ML DIALYSIS
40.0000 [IU]/kg | INTRAMUSCULAR | Status: DC | PRN
  Filled 2019-11-02: qty 5

## 2019-11-02 NOTE — Progress Notes (Signed)
Pt BP was reported at 79/62(68). Pt stated it is normal for her BP to drop at night. No sob, chest pains, or any of signs of distress. Switch BP cuff to left leg, BP came up to 100/62 (75). Pt is sitting in the recliner resting on and off comfortably, no complaints at this time. Will FYI floor coverage and monitor though out the shift.

## 2019-11-02 NOTE — Progress Notes (Signed)
PROGRESS NOTE    Melissa Adams  VOZ:[id number] DOB: [birth year] DOA: 10/11/2019 PCP: System, Provider Not In   Brief Narrative: Mrs. Ingman is a 73 y.o. F with HF previous EF 40-45%, AF, on Eliquis, HTN, OSA,. severe MR and severe TR, CKD 3, and obesity who presented with 2 to 3 weeks progressive shortness of breath and 22 lb weight gain.  She had tried doubling her Lasix without improvement, then noted to have elevated creatinine, and so was sent to the ER.  In the ER, BNP elevated, creatinine elevated.  Given Lasix and hospitalist service were asked to evaluate for CHF flare.  Patient was admitted with acute on chronic systolic and diastolic heart failure exacerbation, acute on chronic renal failure secondary to cardiorenal syndrome A. fib with RVR, she was found to have Covid positive.  She completed 5 days of Remdesivir and dexamethasone.  For her heart failure she was on midodrine and Lasix drip.  Heart failure team was following.  Patient volume status did not improve and her renal function did not improve.  She was transitioned to hemodialysis.  Patient can be removed from droplet and and isolation on 12/8.  Follow recommendation from nephrology in regards when to place  permanent tunnel hemodialysis catheter.  After hemodialysis catheter placement she can be transitioned to Eliquis from heparin.   Assessment & Plan:   Active Problems:   Acute CHF (congestive heart failure) (HCC)   Atrial fibrillation, chronic (HCC)   Essential hypertension   Sleep apnea   AKI (acute kidney injury) (HCC)   Gout   Normocytic anemia   CHF exacerbation (HCC)   Dyspnea   COVID-19 virus infection   Pressure injury of skin   Acute on chronic systolic heart failure (HCC)   1-Acute on chronic systolic and diastolic congestive heart failure exacerbation: Moderate to severe mitral regurgitation and severe tricuspid regurgitation:  -Patient was a started on diuretics.  Creatinine worsened, repeat  echo with ejection fraction down to 20 to 25%, advanced heart failure team consulted and following. -Patient was a started on midodrine and Lasix drip.  Renal function continued to worsen and patient was a started on CRRT and subsequently hemodialysis. -First hemodialysis on 12/1 -Off midodrine.  -fluid manage with HD> 12-05, 12/8  2-Cardiorenal syndrome, chronic kidney disease a stage III: Creatinine increased up to 3.5.  Patient started on CRRT and subsequently transition to hemodialysis. Nephrology following. Had HD 12-03. 12-05, 12-08  3-Hyponatremia: Tolvaptan  last dose given on 11/22.  A stable monitor.  4-Chronic A fib;  DCCV was unsuccessful.  On heparin gtt.  Amiodarone discontinue by cardiology on 12-02.  Started on metoprolol 12-02. Metoprolol decrease to 12.5 BID  5-Coronavirus infection;  ? Pneumonitis.  Needs to remain in isolation for 21 days, from positive test, until Dec 8th.  Completed Remdesivir 5 days and 3 days Dexamethasone.  COVID-19 Labs  No results for input(s): DDIMER, FERRITIN, LDH, CRP in the last 72 hours.  Lab Results  Component Value Date   SARSCOV2NAA POSITIVE (A) 10/12/2019   SARSCOV2NAA POSITIVE (A) 10/11/2019    6-PNA; Fevers, mild Repeated chest x ray 12-04 negative for PNA>  Blood culture; no growth to date.  Chest x ray 12-05; new hazy opacity in the right lung base.  Started  Cefepime 12-05, check  sputum culture. MRSA PCR>  Treated with cefepime for 5 days.   Anemia of chronic diseases;  Stable.   OSA;  Resume CPAP when off precaution.   HTN; BP  has been soft.   Stage II pressure injury right labia Not POA. Local care.   Gout; continue with allopurinol.   Hypotension; overnight 12-06, suspect related to hypovolemia post HD. SBP normal this am.  BP improved.   Pressure Injury 10/21/19 Labia Right;Medial Stage II -  Partial thickness loss of dermis presenting as a shallow open ulcer with a red, pink wound bed without  slough. (Active)  10/21/19 1200  Location: Labia  Location Orientation: Right;Medial  Staging: Stage II -  Partial thickness loss of dermis presenting as a shallow open ulcer with a red, pink wound bed without slough.  Wound Description (Comments):   Present on Admission: No     Estimated body mass index is 42.45 kg/m as calculated from the following:   Height as of this encounter: 5\' 3"  (1.6 m).   Weight as of this encounter: 108.7 kg.   DVT prophylaxis: on Heparin  Code Status: full code Family Communication: Daughter updated.  Disposition Plan: remain in the hospital for treatment of renal faiure and heart failure.  Consultants:   Cardiology  Nephrology   Procedures:   11/17 renal US -- no hydronephrosis, no medicorenal disease  11/17 VQ scan -- low risk  11/17 echo -- EF 20-25%, TR and MR  11/18 lower extremity US -- no DVT  11/18 PICC placed and milrinone started  11/21 Tolvaptan  11/22 Tolvaptan  11/23 dialyis cather placed CRRT started  11/29 CRRT stopped  11/30 milrinone stopped and transfer OOU  12/1 TEE and DCCV (failed)    Antimicrobials:   Remdesivir 11/17 >> 11/22   Subjective: Patient seen in HD, she feels her nerve "shut down", she wants to finished  HD> she denies chest pain or dyspnea. She is alert and conversant, follows command  Objective: Vitals:   11/02/19 1200 11/02/19 1230 11/02/19 1300 11/02/19 1322  BP: (!) 155/36 (!) 155/76 (!) 166/39 (!) 114/50  Pulse: 82 67 76 81  Resp:    19  Temp:    97.7 F (36.5 C)  TempSrc:    Oral  SpO2:    98%  Weight:      Height:        Intake/Output Summary (Last 24 hours) at 11/02/2019 1519 Last data filed at 11/02/2019 1322 Gross per 24 hour  Intake -  Output 2500 ml  Net -2500 ml   Filed Weights   10/30/19 0422 10/30/19 1145 10/30/19 1530  Weight: 110.7 kg 110.7 kg 108.7 kg    Examination:  General exam: NAD Respiratory system: CTA Cardiovascular system: S 1, S 2 RRR  Gastrointestinal system: BS present, soft, nt Central nervous system: alert, conversant, follows command Extremities: Symmetric power.  Skin: No rashes   Data Reviewed: I have personally reviewed following labs and imaging studies  CBC: Recent Labs  Lab 10/29/19 0906 10/30/19 0203 10/31/19 0319 11/01/19 0400 11/02/19 0305  WBC 5.1 5.5 5.0 5.6 4.8  HGB 8.6* 8.9* 8.3* 8.8* 9.1*  HCT 28.7* 29.5* 28.0* 28.8* 30.3*  MCV 81.8 82.2 83.6 82.5 84.2  PLT 156 155 153 168 454   Basic Metabolic Panel: Recent Labs  Lab 10/29/19 0618 10/29/19 0619 10/30/19 0203 10/31/19 0319 11/01/19 0400 11/02/19 0305 11/02/19 0943  NA 133*  --  133* 134* 135  --  134*  K 3.9  --  4.3 4.0 4.6  --  4.3  CL 97*  --  96* 98 97*  --  96*  CO2 27  --  27 26 27   --  24  GLUCOSE 83  --  87 89 87  --  140*  BUN 14  --  23 19 27*  --  32*  CREATININE 3.32*  --  4.15* 3.86* 4.89*  --  5.58*  CALCIUM 8.9  --  8.8* 8.8* 9.3  --  9.5  MG  --  2.1 2.1 2.1 2.1 2.2  --   PHOS 2.7  --  2.8 2.8 2.9  --  3.3   GFR: Estimated Creatinine Clearance: 10.8 mL/min (A) (by C-G formula based on SCr of 5.58 mg/dL (H)). Liver Function Tests: Recent Labs  Lab 10/27/19 0402  10/29/19 0618 10/30/19 0203 10/31/19 0319 11/01/19 0400 11/02/19 0943  AST 19  --   --   --   --   --   --   ALT 21  --   --   --   --   --   --   ALKPHOS 138*  --   --   --   --   --   --   BILITOT 1.0  --   --   --   --   --   --   PROT 5.4*  --   --   --   --   --   --   ALBUMIN 2.5*  2.6*   < > 2.8* 2.9* 2.8* 3.0* 2.9*   < > = values in this interval not displayed.   No results for input(s): LIPASE, AMYLASE in the last 168 hours. No results for input(s): AMMONIA in the last 168 hours. Coagulation Profile: No results for input(s): INR, PROTIME in the last 168 hours. Cardiac Enzymes: No results for input(s): CKTOTAL, CKMB, CKMBINDEX, TROPONINI in the last 168 hours. BNP (last 3 results) No results for input(s): PROBNP in the last 8760  hours. HbA1C: No results for input(s): HGBA1C in the last 72 hours. CBG: No results for input(s): GLUCAP in the last 168 hours. Lipid Profile: No results for input(s): CHOL, HDL, LDLCALC, TRIG, CHOLHDL, LDLDIRECT in the last 72 hours. Thyroid Function Tests: No results for input(s): TSH, T4TOTAL, FREET4, T3FREE, THYROIDAB in the last 72 hours. Anemia Panel: No results for input(s): VITAMINB12, FOLATE, FERRITIN, TIBC, IRON, RETICCTPCT in the last 72 hours. Sepsis Labs: No results for input(s): PROCALCITON, LATICACIDVEN in the last 168 hours.  Recent Results (from the past 240 hour(s))  Culture, blood (routine x 2)     Status: None (Preliminary result)   Collection Time: 10/29/19 10:06 AM   Specimen: BLOOD LEFT HAND  Result Value Ref Range Status   Specimen Description BLOOD LEFT HAND  Final   Special Requests   Final    BOTTLES DRAWN AEROBIC AND ANAEROBIC Blood Culture adequate volume   Culture   Final    NO GROWTH 4 DAYS Performed at [facility], [address], [place], [place] [address]    Report Status PENDING  Incomplete  Culture, blood (routine x 2)     Status: None (Preliminary result)   Collection Time: 10/29/19 10:07 AM   Specimen: BLOOD LEFT ARM  Result Value Ref Range Status   Specimen Description BLOOD LEFT ARM  Final   Special Requests   Final    BOTTLES DRAWN AEROBIC AND ANAEROBIC Blood Culture adequate volume   Culture   Final    NO GROWTH 4 DAYS Performed at [facility], [place] [address], [place], [place] [address]    Report Status PENDING  Incomplete  MRSA  PCR Screening     Status: None   Collection Time: 11/01/19  2:45 PM   Specimen: Nasopharyngeal  Result Value Ref Range Status   MRSA by PCR NEGATIVE NEGATIVE Final    Comment:        The GeneXpert MRSA Assay (FDA approved for NASAL specimens only), is one component of a comprehensive MRSA colonization surveillance program. It is not intended to diagnose MRSA infection nor to guide or  monitor treatment for MRSA infections. Performed at [facility], [place] [address], [place], [place] [address]          Radiology Studies: No results found.      Scheduled Meds: . allopurinol  200 mg Oral Daily  . atorvastatin  40 mg Oral Daily  . Chlorhexidine Gluconate Cloth  6 each Topical Daily  . Chlorhexidine Gluconate Cloth  6 each Topical Q0600  . feeding supplement (NEPRO CARB STEADY)  237 mL Oral Q24H  . mouth rinse  15 mL Mouth Rinse BID  . metoprolol succinate  12.5 mg Oral BID  . multivitamin  1 tablet Oral QHS  . vitamin C  500 mg Oral Daily  . zinc sulfate  220 mg Oral Daily   Continuous Infusions: . sodium chloride Stopped (10/25/19 1258)  . sodium chloride Stopped (11/01/19 1500)  . sodium chloride    . ceFEPime (MAXIPIME) IV    . heparin 1,050 Units/hr (11/01/19 1707)     LOS: 21 days    Time spent: 35 minutes.     Elmarie Shiley, MD [facility]   If 7PM-7AM, please contact night-coverage [email or web address] Password [facility] 11/02/2019, 3:19 PM

## 2019-11-02 NOTE — Progress Notes (Signed)
Report called and given to Jani Gravel on [facility]. Request made to d/c isolation before transferring. Dr. Tyrell Antonio notified.

## 2019-11-02 NOTE — Progress Notes (Signed)
ANTICOAGULATION CONSULT NOTE - Follow Up Consult  Pharmacy Consult for Heparin + Cefepime Indication: atrial fibrillation + PNA  No Known Allergies  Patient Measurements: Height: 5\' 3"  (160 cm) Weight: 239 lb 10.2 oz (108.7 kg) IBW/kg (Calculated) : 52.4  Vital Signs: Temp: 98.5 F (36.9 C) (12/08 0915) Temp Source: Oral (12/08 0915) BP: 105/48 (12/08 0945) Pulse Rate: 88 (12/08 0945)  Labs: Recent Labs    10/31/19 0319 11/01/19 0400 11/02/19 0305  HGB 8.3* 8.8* 9.1*  HCT 28.0* 28.8* 30.3*  PLT 153 168 171  HEPARINUNFRC 0.45 0.50 0.37  CREATININE 3.86* 4.89*  --     Estimated Creatinine Clearance: 12.3 mL/min (A) (by C-G formula based on SCr of 4.89 mg/dL (H)).   Assessment:  Anticoag: Heparin for AF - Apixaban PTA (LD 11/8), Afib w/ valvular dz - no plans for surgery at this time per cards. Continues on heparin pending perm HD access. - V/Q neg for PE, LE dopplers neg - HL 0.37 on 1050 units/hr, Hgb 9.1 up and plts stable.  ID: COVID + s/p Remdesivir. Afeb, WBC wnl - MD notes: Needs to remain in isolation for 21 days, from positive test, until Dec 8th.  - Chest x ray 12-05; new hazy opacity in the right lung base. Add Cefepime  Cefepime 12/6 >> Remdesivir 11/17 >>11/21 Dexamethasone 11/17 >> 11/19 Vitamin C, Zinc Sulfate  12/4: BC x 2:>> 11/17: COVID +  Goal of Therapy:  Heparin level 0.3-0.7 units/ml Monitor platelets by anticoagulation protocol: Yes    Plan:  Cefepime 2gm IV TTS Continue heparin IV at 1050 units/hr until get permanent HD access Monitor daily heparin level and CBC, s/sx bleeding F/u re-start PTA apixaban after access obtained  Summers Buendia S. Alford Highland, PharmD, BCPS Clinical Staff Pharmacist Eilene Ghazi Stillinger 11/02/2019,10:17 AM

## 2019-11-02 NOTE — Progress Notes (Signed)
Physical Therapy Treatment Patient Details Name: Melissa Adams MRN: [id number] DOB: 1946-07-14 Today's Date: 11/02/2019    History of Present Illness 73 y.o. female with a medical history of heart failure, atrial fibrillation, hypertension, sleep apnea, who presented to the emergency department with complaints of shortness of breath and weight gain. 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 In ED found to have an elevated BNP with dyspnea on exertion. CXR showed small right pleural effusion with bibasilar atelectasis. Admitted 10/11/19 Cardiorenal syndrome-was on CRRT from 11/23-11/29,    PT Comments    Pt sitting on EoB after HD, eager to try Rollator in hallway. Pt still limited by generalized weakness and mild instability. Pt is supervision for sit>stand from bed and Rollator and min guard for ambulation 450 feet with Rollator and 1x seated rest break. Continued plans for d/c home with HHPT and Bariatric Rollator. PT will continue to follow acutely.   Follow Up Recommendations  Home health PT;Supervision - Intermittent     Equipment Recommendations  Other (comment)(Bariatric Rollator)       Precautions / Restrictions Precautions Precautions: Fall Restrictions Weight Bearing Restrictions: No    Mobility  Bed Mobility               General bed mobility comments: Pt up in chair  Transfers Overall transfer level: Needs assistance Equipment used: None Transfers: Sit to/from Stand Sit to Stand: Supervision         General transfer comment: supervision for lines and safety  Ambulation/Gait Ambulation/Gait assistance: Min guard Gait Distance (Feet): 450 Feet Assistive device: 4-wheeled walker Gait Pattern/deviations: Step-through pattern;Decreased stride length Gait velocity: decr Gait velocity interpretation: 1.31 - 2.62 ft/sec, indicative of limited community ambulator General Gait Details: assist for lines and to negotiate tight spaces with  lines      Balance Overall balance assessment: Mild deficits observed, not formally tested                                          Cognition Arousal/Alertness: Awake/alert Behavior During Therapy: WFL for tasks assessed/performed Overall Cognitive Status: Within Functional Limits for tasks assessed                                           General Comments General comments (skin integrity, edema, etc.): SaO2 monitoring problematic as probe stopped working with intial ambulation however pt able to converse without SoB throughout session Pt with one bout of HR 123 bpm, with seated rest break HR returned to low 90s, ambulated back to room with max HR 118bpm      Pertinent Vitals/Pain Pain Assessment: No/denies pain           PT Goals (current goals can now be found in the care plan section) Acute Rehab PT Goals Patient Stated Goal: go home PT Goal Formulation: With patient Time For Goal Achievement: 11/15/19 Potential to Achieve Goals: Good Progress towards PT goals: Progressing toward goals    Frequency    Min 3X/week      PT Plan Current plan remains appropriate;Equipment recommendations need to be updated       AM-PAC PT "6 Clicks" Mobility   Outcome Measure  Help needed turning from your back to your side while in a flat bed without using bedrails?:  None Help needed moving from lying on your back to sitting on the side of a flat bed without using bedrails?: None Help needed moving to and from a bed to a chair (including a wheelchair)?: None Help needed standing up from a chair using your arms (e.g., wheelchair or bedside chair)?: None Help needed to walk in hospital room?: A Little Help needed climbing 3-5 steps with a railing? : A Little 6 Click Score: 22    End of Session   Activity Tolerance: Patient tolerated treatment well Patient left: with call bell/phone within reach;in chair Nurse Communication: Mobility  status PT Visit Diagnosis: Unsteadiness on feet (R26.81);Other abnormalities of gait and mobility (R26.89);Muscle weakness (generalized) (M62.81);History of falling (Z91.81) Pain - Right/Left: Right Pain - part of body: (flank)     Time: 9629-5284 PT Time Calculation (min) (ACUTE ONLY): 23 min  Charges:  $Gait Training: 23-37 mins                     Philis Doke B. Migdalia Dk PT, DPT Acute Rehabilitation Services Pager [phone] Office [phone]    [place] 11/02/2019, 5:55 PM

## 2019-11-02 NOTE — Progress Notes (Signed)
Patient ID: Melissa Adams, female   DOB: [birth year], 73 y.o.   MRN: [id number] [facility] Progress Note   Assessment/ Plan:   1. Acute kidney Injury on chronic kidney disease stage III: Anuric and without any evidence of renal recovery.  Currently getting dialysis via temporary catheter and I will consult with vascular surgery following discontinuation of her isolation for conversion to tunneled catheter/permanent access.  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  On dialysis at this time without problems.  2.  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  Ongoing volume management with hemodialysis.  She also has underlying valvular heart disease and atrial fibrillation. 3.  Atrial fibrillation: Rate controlled and appearing to tolerate current dose of metoprolol without significant hypotension. 4.  Anemia of critical illness: Hemoglobin/hematocrit appears stable without any overt blood loss.  We will continue to follow. 5.  COVID-19 infection: Status post remdesivir and dexamethasone (will need to remain in isolation until 12/8)  Subjective:   Tolerating dialysis without problems.    Objective:   BP 125/65   Pulse 98   Temp 98.5 F (36.9 C) (Oral)   Resp (!) 22   Ht 5\' 3"  (1.6 m)   Wt 108.7 kg   SpO2 97%   BMI 42.45 kg/m   Intake/Output Summary (Last 24 hours) at 11/02/2019 1124 Last data filed at 11/01/2019 1500 Gross per 24 hour  Intake 474.47 ml  Output -  Net 474.47 ml   Weight change:   Physical Exam: Patient not personally examined due to COVID-19 status and need to limit contact exposure-pertinent findings discussed and reviewed with Dr. Tyrell Antonio and HD RN at bedside  Imaging: No results found.  Labs: BMET Recent Labs  Lab 10/27/19 0402 10/28/19 0716 10/29/19 0618 10/30/19 0203  10/31/19 0319 11/01/19 0400 11/02/19 0943  NA 131* 132* 133* 133* 134* 135 134*  K 4.1 4.0 3.9 4.3 4.0 4.6 4.3  CL 96* 95* 97* 96* 98 97* 96*  CO2 23 26 27 27 26 27 24   GLUCOSE 75 79 83 87 89 87 140*  BUN 14 23 14 23 19  27* 32*  CREATININE 3.21* 4.52* 3.32* 4.15* 3.86* 4.89* 5.58*  CALCIUM 8.6* 9.1 8.9 8.8* 8.8* 9.3 9.5  PHOS 3.4 3.6 2.7 2.8 2.8 2.9 3.3   CBC Recent Labs  Lab 10/30/19 0203 10/31/19 0319 11/01/19 0400 11/02/19 0305  WBC 5.5 5.0 5.6 4.8  HGB 8.9* 8.3* 8.8* 9.1*  HCT 29.5* 28.0* 28.8* 30.3*  MCV 82.2 83.6 82.5 84.2  PLT 155 153 168 171   Medications:    . allopurinol  200 mg Oral Daily  . atorvastatin  40 mg Oral Daily  . Chlorhexidine Gluconate Cloth  6 each Topical Daily  . Chlorhexidine Gluconate Cloth  6 each Topical Q0600  . feeding supplement (NEPRO CARB STEADY)  237 mL Oral Q24H  . heparin      . mouth rinse  15 mL Mouth Rinse BID  . metoprolol succinate  12.5 mg Oral BID  . multivitamin  1 tablet Oral QHS  . vitamin C  500 mg Oral Daily  . zinc sulfate  220 mg Oral Daily   Elmarie Shiley, MD 11/02/2019, 11:24 AM

## 2019-11-02 NOTE — Procedures (Signed)
Patient seen on Hemodialysis. BP 125/65   Pulse 98   Temp 98.5 F (36.9 C) (Oral)   Resp (!) 22   Ht 5\' 3"  (1.6 m)   Wt 108.7 kg   SpO2 97%   BMI 42.45 kg/m   QB 400, UF goal 2.5L Tolerating treatment without complaints at this time.   Elmarie Shiley MD [facility]. Office # [phone] Pager # [phone] 11:28 AM

## 2019-11-03 ENCOUNTER — Inpatient Hospital Stay (HOSPITAL_COMMUNITY): Payer: Medicare Other

## 2019-11-03 DIAGNOSIS — I5023 Acute on chronic systolic (congestive) heart failure: Secondary | ICD-10-CM

## 2019-11-03 DIAGNOSIS — N179 Acute kidney failure, unspecified: Secondary | ICD-10-CM

## 2019-11-03 DIAGNOSIS — N186 End stage renal disease: Secondary | ICD-10-CM | POA: Diagnosis not present

## 2019-11-03 DIAGNOSIS — U071 COVID-19: Secondary | ICD-10-CM

## 2019-11-03 DIAGNOSIS — L8990 Pressure ulcer of unspecified site, unspecified stage: Secondary | ICD-10-CM

## 2019-11-03 LAB — HEPARIN LEVEL (UNFRACTIONATED)
Heparin Unfractionated: <0.1 IU/mL — ABNORMAL LOW (ref 0.30–0.70)
Heparin Unfractionated: 0.17 IU/mL — ABNORMAL LOW (ref 0.30–0.70)

## 2019-11-03 LAB — CBC
HCT: 28.2 % — ABNORMAL LOW (ref 36.0–46.0)
Hemoglobin: 8.4 g/dL — ABNORMAL LOW (ref 12.0–15.0)
MCH: 25.3 pg — ABNORMAL LOW (ref 26.0–34.0)
MCHC: 29.8 g/dL — ABNORMAL LOW (ref 30.0–36.0)
MCV: 84.9 fL (ref 80.0–100.0)
Platelets: 157 10*3/uL (ref 150–400)
RBC: 3.32 MIL/uL — ABNORMAL LOW (ref 3.87–5.11)
RDW: 26.4 % — ABNORMAL HIGH (ref 11.5–15.5)
WBC: 4.6 10*3/uL (ref 4.0–10.5)
nRBC: 0 % (ref 0.0–0.2)

## 2019-11-03 LAB — CULTURE, BLOOD (ROUTINE X 2)
Culture: NO GROWTH
Culture: NO GROWTH
Special Requests: ADEQUATE
Special Requests: ADEQUATE

## 2019-11-03 LAB — MAGNESIUM: Magnesium: 2 mg/dL (ref 1.7–2.4)

## 2019-11-03 NOTE — Consult Note (Addendum)
Hospital Consult    Reason for Consult:  tdc and permanent dialysis access  Requesting Physician:  Posey Pronto MRN #:  [id number]  History of Present Illness: This is a 73 y.o. female who was admitted ~ 3 weeks ago increased SOB and weight gain.    She was covid19 + on admission.  She was treated with remdesivir and dexamethasone.  Isolation was discontinued on 12/8.  She was scheduled for MVR/TVR in [place] [facility] on 11/18. She was found to have LVEF of 20-25%.  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    She is now anuric and there is no evidence of renal recovery and [facility]VS is consulted for [facility] and permanent access.  She is getting HD via a temp cath.  She is scheduled to get HD tomorrow as well.    She does have hx of Afib & is on a heparin gtt.  PTA she was on Eliquis.   She states she is right handed.  She also states that she had an injury to the right antecubital space ~ 30 years ago.    The pt is on a statin for cholesterol management.  The pt is not on a daily aspirin.   Other AC:  Eliquis PTA (heparin now) The pt is  on BB for hypertension.   The pt is not diabetic.   Tobacco hx:  never  Past Medical History:  Diagnosis Date  . A-fib ([place])   . Cardiomyopathy ([employer])   . CHF (congestive heart failure) ([place])   . Edema   . Hypertension   . Pulmonary hypertension ([place])   . Sleep apnea   . Tricuspid regurgitation     Past Surgical History:  Procedure Laterality Date  . CARDIOVERSION N/A 10/26/2019   Procedure: CARDIOVERSION;  Surgeon: [name], MD;  Location: [facility] ENDOSCOPY;  Service: Cardiovascular;  Laterality: N/A;  . TEE WITHOUT CARDIOVERSION N/A 10/26/2019   Procedure: TRANSESOPHAGEAL ECHOCARDIOGRAM (TEE);  Surgeon: [name], MD;  Location: [facility] ENDOSCOPY;  Service: Cardiovascular;  Laterality: N/A;    No Known Allergies  Prior to Admission medications    Medication Sig Start Date End Date Taking? Authorizing Provider  allopurinol (ZYLOPRIM) 300 MG tablet Take 300 mg by mouth daily.   Yes [provider]  apixaban (ELIQUIS) 5 MG TABS tablet Take 5 mg by mouth 2 (two) times daily.   Yes [provider]  atorvastatin (LIPITOR) 40 MG tablet Take 40 mg by mouth daily.   Yes [provider]  colchicine 0.6 MG tablet Take 0.6 mg by mouth as needed (for Gout).    Yes [provider]  furosemide (LASIX) 80 MG tablet Take 80 mg by mouth daily.    Yes [provider]  metoprolol succinate (TOPROL-XL) 100 MG 24 hr tablet Take 50 mg by mouth daily. Take with or immediately following a meal.    Yes [provider]  montelukast (SINGULAIR) 10 MG tablet Take 10 mg by mouth daily.   Yes [provider]    Social History   Socioeconomic History  . Marital status: Widowed    Spouse name: Not on file  . Number of children: Not on file  . Years of education: Not on file  . Highest education level: Not on file  Occupational History  . Not on file  Social Needs  . Financial resource strain: Not on file  . Food  insecurity    Worry: Not on file    Inability: Not on file  . Transportation needs    Medical: Not on file    Non-medical: Not on file  Tobacco Use  . Smoking status: Never Smoker  . Smokeless tobacco: Never Used  Substance and Sexual Activity  . Alcohol use: Not Currently  . Drug use: Not on file  . Sexual activity: Not Currently  Lifestyle  . Physical activity    Days per week: Not on file    Minutes per session: Not on file  . Stress: Not on file  Relationships  . Social Herbalist on phone: Not on file    Gets together: Not on file    Attends religious service: Not on file    Active member of club or organization: Not on file    Attends meetings of clubs or organizations: Not on file    Relationship status: Not on file  . Intimate partner violence    Fear of  current or ex partner: Not on file    Emotionally abused: Not on file    Physically abused: Not on file    Forced sexual activity: Not on file  Other Topics Concern  . Not on file  Social History Narrative  . Not on file     Family History  Problem Relation Age of Onset  . Seizures Father   . CAD Father   . Diabetes Sister   . Lupus Sister   . Hypertension Sister     ROS: [x]  Positive   [ ]  Negative   [ ]  All sytems reviewed and are negative  Cardiac: [x]  TR [x]  CHF [x]  afib [x]  high blood pressure  Vascular: []  non-healing ulcers []  swelling in legs  Pulmonary: [x]  Covid19 + - taken off isolation yesterday [x]  OSA  Neurologic: []  hx of CVA   Hematologic: []  hx of cancer []  bleeding problems []  problems with blood clotting easily  Endocrine:   []  diabetes  GI []  vomiting blood  GU: [x]  CKD/renal failure [x]  HD  Psychiatric: []  anxiety []  depression  Musculoskeletal: []  arthritis []  joint pain  Integumentary: []  rashes []  ulcers  Constitutional: []  fever []  chills   Physical Examination  Vitals:   11/03/19 0128 11/03/19 0356  BP: 103/71 107/69  Pulse: 60 97  Resp: 20 20  Temp:  98.5 F (36.9 C)  SpO2: 99% 100%   Body mass index is 43.13 kg/m.  General:  WDWN in NAD Gait: Not observed HENT: WNL, normocephalic Pulmonary: normal non-labored breathing, without Rales, rhonchi,  wheezing Cardiac: regular, without  Murmurs, rubs or gallops; without carotid bruit on left.  Did not examine right due to temp cath in place Abdomen:  soft, NT/ND, no masses Skin: without rashes Vascular Exam/Pulses:  Right Left  Radial Unable to palpate  2+ (normal)  Ulnar Unable to palpate  Unable to palpate    Extremities: without ischemic changes, without Gangrene , without cellulitis; without open wounds;  Musculoskeletal: no muscle wasting or atrophy  Neurologic: A&O X 3;  No focal weakness or paresthesias are detected; speech is fluent/normal  Psychiatric:  The pt has Normal affect.   CBC    Component Value Date/Time   WBC 4.6 11/03/2019 0355   RBC 3.32 (L) 11/03/2019 0355   HGB 8.4 (L) 11/03/2019 0355   HCT 28.2 (L) 11/03/2019 0355   PLT 157 11/03/2019 0355   MCV 84.9 11/03/2019 0355   MCH 25.3 (  L) 11/03/2019 0355   MCHC 29.8 (L) 11/03/2019 0355   RDW 26.4 (H) 11/03/2019 0355   LYMPHSABS 1.0 10/18/2019 0439   MONOABS 1.0 10/18/2019 0439   EOSABS 0.0 10/18/2019 0439   BASOSABS 0.0 10/18/2019 0439    BMET    Component Value Date/Time   NA 134 (L) 11/02/2019 0943   K 4.3 11/02/2019 0943   CL 96 (L) 11/02/2019 0943   CO2 24 11/02/2019 0943   GLUCOSE 140 (H) 11/02/2019 0943   BUN 32 (H) 11/02/2019 0943   CREATININE 5.58 (H) 11/02/2019 0943   CALCIUM 9.5 11/02/2019 0943   GFRNONAA 7 (L) 11/02/2019 0943   GFRAA 8 (L) 11/02/2019 0943    COAGS: Lab Results  Component Value Date   INR 1.2 10/26/2019   INR 1.8 (H) 10/12/2019     Non-Invasive Vascular Imaging:   BUE vein mapping ordered   ASSESSMENT/PLAN: This is a 73 y.o. female with AKI now ESRD in need of TDC and permanent dialysis access who was covid19 + on admission and taken off isolation yesterday.  -pt is right hand dominant.  BUE vein mapping is pending.  She states she had an injury to the right antecubital space ~ 30 years ago.  Also unable to palpate right radial pulse.  She does have a palpable left radial pulse and most likely will plan for access in the left arm.  Discussed this with pt and also exchanging her catheter.  -pt is on heparin gtt for afib. -Dr. Donnetta Hutching to see pt later today   Leontine Locket, PA-C [facility] [phone]   I have examined the patient, reviewed and agree with above.  Patient has had trauma to her right antecubital area.  Does have a right radial pulse.  She is right-handed.  Duplex shows moderate antecubital vein on the left and moderate antecubital vein proximally.  She does have a PICC line  in place.  Patient is currently dialyzing via a right IJ temporary cath.  She is on heparin pending conversion to Eliquis.    Will plan conversion from temporary catheter to tunneled catheter tomorrow.  No time in OR for permanent access in her arm which we can do at a later date.  Will need to run on second shift for hemodialysis tomorrow after surgery Curt Jews, MD 11/03/2019 3:20 PM

## 2019-11-03 NOTE — Progress Notes (Signed)
ANTICOAGULATION CONSULT NOTE - Follow Up Consult  Pharmacy Consult for Heparin + Cefepime Indication: atrial fibrillation + PNA  No Known Allergies  Patient Measurements: Height: 5\' 3"  (160 cm) Weight: 243 lb 8 oz (110.5 kg) IBW/kg (Calculated) : 52.4  Vital Signs: Temp: 98.5 F (36.9 C) (12/09 0356) Temp Source: Oral (12/09 0356) BP: 107/69 (12/09 0356) Pulse Rate: 97 (12/09 0356)  Labs: Recent Labs    11/01/19 0400 11/02/19 0305 11/02/19 0943 11/03/19 0355 11/03/19 0555  HGB 8.8* 9.1*  --  8.4*  --   HCT 28.8* 30.3*  --  28.2*  --   PLT 168 171  --  157  --   HEPARINUNFRC 0.50 0.37  --  <0.10* 0.17*  CREATININE 4.89*  --  5.58*  --   --     Estimated Creatinine Clearance: 10.9 mL/min (A) (by C-G formula based on SCr of 5.58 mg/dL (H)).   Assessment:  Anticoag: Heparin for AF - Apixaban PTA (LD 11/8), Afib w/ valvular dz - no plans for surgery at this time per cards. Continues on heparin pending perm HD access. - V/Q neg for PE, LE dopplers neg - HL fell overnight 0.17 ( rechecked for accuracy) on 1050 units/hr, Hgb low stable and plts stable. No bleeding noted Plan heparin until permanent HD access placed this week Then resume PTA apixaban 5mg  BID Cr > 1,5, Age < 11, Wt > 60kg   ID: COVID + s/p Remdesivir. Afeb, WBC wnl - MD notes: Needs to remain in isolation for 21 days, from positive test, until Dec 8th - now removed.  - Chest x ray 12-05; new hazy opacity in the right lung base. Add Cefepime x5 day - last dose post HD 12/10  Cefepime 12/6 >>(12/10) Remdesivir 11/17 >>11/21 Dexamethasone 11/17 >> 11/19 Vitamin C, Zinc Sulfate  12/4: BC x 2:>>ngtd 11/17: COVID +  Goal of Therapy:  Heparin level 0.3-0.7 units/ml Monitor platelets by anticoagulation protocol: Yes    Plan:  Cefepime 2gm IV TTS last dose in computer for 12/10 post HD Increase  heparin drip 1100  units/hr until get permanent HD access Monitor daily heparin level and CBC, s/sx  bleeding F/u re-start PTA apixaban 5mg  BID  after HD access obtained  Bonnita Nasuti Pharm.D. CPP, BCPS Clinical Pharmacist [phone] 11/03/2019 8:15 AM

## 2019-11-03 NOTE — Care Management Important Message (Signed)
Important Message  Patient Details  Name: Melissa Adams MRN: [id number] Date of Birth: 1946/03/14   Medicare Important Message Given:  Yes     Shelda Altes 11/03/2019, 12:18 PM

## 2019-11-03 NOTE — TOC Progression Note (Signed)
Transition of Care [facility]) - Progression Note    Patient Details  Name: Melissa Adams MRN: [id number] Date of Birth: 05-17-1946  Transition of Care [facility]) CM/SW [place], [place] Phone Number: [phone] 11/03/2019, 4:13 PM  Clinical Narrative:     CSW continues to follow for discharge planning needs. Spoke with renal navigator Terri Piedra regarding discharge planning and outpatient dialysis. She is working on placement, patient's last covid test being positive is potentially a barrier. CSW has requested MD order updated COVID test, with hopes patient will test negative to increase timeliness of identifying outpatient HD facility to accept her.    Expected Discharge Plan: [place] Services Barriers to Discharge: Other (comment)(Pt will likely need clipping)  Expected Discharge Plan and Services Expected Discharge Plan: [place] arrangements for the past 2 months: Single Family Home                                       Social Determinants of Health (SDOH) Interventions    Readmission Risk Interventions No flowsheet data found.

## 2019-11-03 NOTE — Progress Notes (Signed)
Patient ID: Sweetie Giebler, female   DOB: 07-13-1946, 73 y.o.   MRN: [id number] [facility] Progress Note   Assessment/ Plan:   1. Acute kidney Injury on chronic kidney disease stage III: Anuric and without any evidence of renal recovery-now likely with prolonged dialysis needs/ESRD.  Currently getting dialysis via temporary catheter and I will consult  vascular surgery today for conversion to tunneled catheter/permanent access.  Will order for hemodialysis again tomorrow. 2.  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  Ongoing volume management with hemodialysis.  She also has underlying valvular heart disease and atrial fibrillation. 3.  Atrial fibrillation: Rate controlled and appearing to tolerate current dose of metoprolol without significant hypotension. 4.  Anemia of critical illness: Hemoglobin/hematocrit appears stable without any overt blood loss.  We will continue to follow. 5.  COVID-19 infection: Status post remdesivir and dexamethasone-isolation discontinued on 12/8.  Subjective:   She reports of tolerated dialysis well overnight and denies any emergent complaints.    Objective:   BP 107/69 (BP Location: Left Leg)   Pulse 97   Temp 98.5 F (36.9 C) (Oral)   Resp 20   Ht 5\' 3"  (1.6 m)   Wt 110.5 kg   SpO2 100%   BMI 43.13 kg/m   Intake/Output Summary (Last 24 hours) at 11/03/2019 0815 Last data filed at 11/03/2019 0334 Gross per 24 hour  Intake 222 ml  Output 2600 ml  Net -2378 ml   Weight change:   Physical Exam: Gen: Appears comfortable, ambulating around room CVS: Pulse regular rhythm, normal rate, S1 and S2 normal Resp: Clear to auscultation bilaterally without distinct rales or rhonchi Abd: Soft, obese, nontender, bowel sounds normal Ext: 1+ bilateral pitting lower extremity edema  Imaging: No results found.  Labs: BMET Recent Labs  Lab 10/28/19 0716  10/29/19 0618 10/30/19 0203 10/31/19 0319 11/01/19 0400 11/02/19 0943  NA 132* 133* 133* 134* 135 134*  K 4.0 3.9 4.3 4.0 4.6 4.3  CL 95* 97* 96* 98 97* 96*  CO2 26 27 27 26 27 24   GLUCOSE 79 83 87 89 87 140*  BUN 23 14 23 19  27* 32*  CREATININE 4.52* 3.32* 4.15* 3.86* 4.89* 5.58*  CALCIUM 9.1 8.9 8.8* 8.8* 9.3 9.5  PHOS 3.6 2.7 2.8 2.8 2.9 3.3   CBC Recent Labs  Lab 10/31/19 0319 11/01/19 0400 11/02/19 0305 11/03/19 0355  WBC 5.0 5.6 4.8 4.6  HGB 8.3* 8.8* 9.1* 8.4*  HCT 28.0* 28.8* 30.3* 28.2*  MCV 83.6 82.5 84.2 84.9  PLT 153 168 171 157   Medications:    . allopurinol  200 mg Oral Daily  . atorvastatin  40 mg Oral Daily  . Chlorhexidine Gluconate Cloth  6 each Topical Daily  . Chlorhexidine Gluconate Cloth  6 each Topical Q0600  . feeding supplement (NEPRO CARB STEADY)  237 mL Oral Q24H  . mouth rinse  15 mL Mouth Rinse BID  . metoprolol succinate  12.5 mg Oral BID  . multivitamin  1 tablet Oral QHS  . vitamin C  500 mg Oral Daily  . zinc sulfate  220 mg Oral Daily   Elmarie Shiley, MD 11/03/2019, 8:15 AM

## 2019-11-03 NOTE — Progress Notes (Signed)
Vein mapping  has been completed. Refer to [facility] under chart review to view preliminary results.   11/03/2019  12:44 PM Melissa Adams, Bonnye Fava

## 2019-11-03 NOTE — Progress Notes (Signed)
Physical Therapy Treatment Patient Details Name: Melissa Adams MRN: [id number] DOB: Apr 20, 1946 Today's Date: 11/03/2019    History of Present Illness 73 y.o. female with a medical history of heart failure, atrial fibrillation, hypertension, sleep apnea, who presented to the emergency department with complaints of shortness of breath and weight gain. 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 In ED found to have an elevated BNP with dyspnea on exertion. CXR showed small right pleural effusion with bibasilar atelectasis. Admitted 10/11/19 Cardiorenal syndrome-was on CRRT from 11/23-11/29,    PT Comments    Patient received sitting in straight back chair with feet up on bed. Agrees to PT. Performed sit to stand independently, however lost balance with initial standing requiring min assist to regain. Ambulated 300 feet with bariatric rollator and min guard. Normal walking speed, reports mild SOB once returned to room. Exercises performed at end of session.  Patient will continue to benefit from skilled PT while here to improve activity tolerance and safety with mobility.    Follow Up Recommendations  Home health PT;Supervision - Intermittent     Equipment Recommendations  Other (comment)(Bariatric rollator)    Recommendations for Other Services       Precautions / Restrictions Precautions Precautions: Fall Restrictions Weight Bearing Restrictions: No    Mobility  Bed Mobility               General bed mobility comments: Pt up in chair  Transfers Overall transfer level: Needs assistance   Transfers: Sit to/from Stand Sit to Stand: Min assist         General transfer comment: initial standing unsteadiness requiring min assist.  Ambulation/Gait Ambulation/Gait assistance: Min guard Gait Distance (Feet): 300 Feet Assistive device: 4-wheeled walker   Gait velocity: decr Gait velocity interpretation: >4.37 ft/sec, indicative of normal walking  speed     Stairs             Wheelchair Mobility    Modified Rankin (Stroke Patients Only)       Balance Overall balance assessment: Mild deficits observed, not formally tested                                          Cognition Arousal/Alertness: Awake/alert Behavior During Therapy: WFL for tasks assessed/performed Overall Cognitive Status: Within Functional Limits for tasks assessed                                        Exercises Other Exercises Other Exercises: STS x 10, LAQ, marching x10    General Comments        Pertinent Vitals/Pain Pain Assessment: No/denies pain    Home Living                      Prior Function            PT Goals (current goals can now be found in the care plan section) Acute Rehab PT Goals Patient Stated Goal: go home PT Goal Formulation: With patient Time For Goal Achievement: 11/15/19 Potential to Achieve Goals: Good Progress towards PT goals: Progressing toward goals    Frequency    Min 3X/week      PT Plan Current plan remains appropriate    Co-evaluation  AM-PAC PT "6 Clicks" Mobility   Outcome Measure  Help needed turning from your back to your side while in a flat bed without using bedrails?: None Help needed moving from lying on your back to sitting on the side of a flat bed without using bedrails?: None Help needed moving to and from a bed to a chair (including a wheelchair)?: A Little Help needed standing up from a chair using your arms (e.g., wheelchair or bedside chair)?: A Little Help needed to walk in hospital room?: A Little Help needed climbing 3-5 steps with a railing? : A Little 6 Click Score: 20    End of Session Equipment Utilized During Treatment: Gait belt Activity Tolerance: Patient tolerated treatment well Patient left: in chair;with call bell/phone within reach Nurse Communication: Mobility status PT Visit Diagnosis:  Unsteadiness on feet (R26.81)     Time: [id number] PT Time Calculation (min) (ACUTE ONLY): 16 min  Charges:  $Gait Training: 8-22 mins                     Aundra Pung, PT, GCS 11/03/19,12:25 PM

## 2019-11-03 NOTE — Progress Notes (Signed)
PROGRESS NOTE    Melissa Adams  LZJ:[id number] DOB: February 25, 1946 DOA: 10/11/2019 PCP: System, Provider Not In   Brief Narrative: Melissa Adams is a 73 y.o. female with a history of of heart failure, hypertension, OSA, severe MR/TR, CKD 3, obesity. She presented secondary to dyspnea and found to have acute heart failure. Patient also with COVID-19, treated with Remdesivir/Decadron. 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 Initially managed on milrinone, Lasix by heart failure team. Now on HD.  AKI on CKD stage III Now with transition to ESRD per nephrology assessment. Previously managed on CRRT now requiring intermittent HD. -Tunneled HD cath per vascular surgery -HD per nephrology -Outpatient HD pending  Hyponatremia Low of 119. Treated with Tolvaptan and CRRT.  Chronic atrial fibrillation Patient initially managed on amiodarone with attempt for DCCV x3, unsuccessful. Currently on metoprolol. Rate controlled. On Eliquis as an outpatient. Currently on heparin drip for procedures. -Continue Heparin drip; transition back to Eliquis once no procedures planned -Continue metoprolol  COVID-19 infection Patient treated with Remdesivir and Dexamethasone. Symptoms resolved. Now off of isolation.  Right LL pneumonia Cefepime x5 days  Anemia of chronic disease In setting of kidney disease  Essential hypertension Normotensive/slightly soft. On metoprolol for  below. -Continue metoprolol  Severe MR/TR Noted. Outpatient cardiology follow-up  Obstructive sleep apnea -Continue CPAP  Stage II pressure injury Right medial labia, not present on admission   DVT prophylaxis: Heparin drip Code Status:   Code Status: Full Code Family Communication: None Disposition Plan: Discharge pending Vascular surgery management and outpatient HD   Consultants:   Nephrology  Vascular surgery  Procedures:   HD  11/17: Transthoracic Echocardiogram IMPRESSIONS    1. Left ventricular ejection fraction, by visual estimation, is 20 to 25%. The left ventricle has severely decreased function. There is no left ventricular hypertrophy.  2. Left ventricular diastolic function could not be evaluated.  3. Mildly dilated left ventricular internal cavity size.  4. Global right ventricle has severely reduced systolic function.The right ventricular size is mildly enlarged.  5. Left atrial size was moderately dilated.  6. Right atrial size was severely dilated.  7. The mitral valve is abnormal. Mild to moderate mitral valve regurgitation. No evidence of mitral stenosis.  8. The tricuspid valve is normal in structure. Tricuspid valve regurgitation moderate.  9. The aortic valve is tricuspid. Aortic valve regurgitation is not visualized. Mild aortic valve sclerosis without stenosis. 10. The pulmonic valve was normal in structure. Pulmonic valve regurgitation is mild. 11. Moderately elevated pulmonary artery systolic pressure. 12. The inferior vena cava is dilated in size with <50% respiratory variability, suggesting right atrial pressure of 15 mmHg. 13. Severe global reduction in LV systolic function; 4 chamber enlargement; mild to moderate MR; severely reduced RV function; moderate TR.   12/1: Transesophageal Echocardiogram/Cardioversion (unsuccessful)  Antimicrobials:  Cefepime    Subjective: No concerns. No chest pain or dyspnea.  Objective: Vitals:  11/03/19 0356 11/03/19 1026 11/03/19 1055 11/03/19 1332  BP: 107/69 138/88  90/74  Pulse: 97 88 93 94  Resp: 20 20  20   Temp: 98.5 F (36.9 C) 98 F (36.7 C)  97.9 F (36.6 C)  TempSrc: Oral Oral  Oral  SpO2: 100% 100%  96%  Weight: 110.5 kg     Height:        Intake/Output Summary (Last 24 hours) at 11/03/2019 1443 Last data filed at 11/03/2019 0900 Gross per 24 hour  Intake 462 ml  Output 401 ml  Net 61 ml   Filed Weights   10/30/19 1530 11/02/19 2058 11/03/19 0356  Weight: 108.7 kg 110.5 kg 110.5 kg    Examination:  General exam: Appears calm and comfortable Respiratory system: Rales bilaterally. Respiratory effort normal. Cardiovascular system: S1 & S2 heard, RRR. No murmurs, rubs, gallops or clicks. Gastrointestinal system: Abdomen is nondistended, soft and nontender. No organomegaly or masses felt. Normal bowel sounds heard. Central nervous system: Alert and oriented. No focal neurological deficits. Extremities: BL LE edema. No calf tenderness Skin: No cyanosis. No rashes Psychiatry: Judgement and insight appear normal. Mood & affect appropriate.     Data Reviewed: I have personally reviewed following labs and imaging studies  CBC: Recent Labs  Lab 10/30/19 0203 10/31/19 0319 11/01/19 0400 11/02/19 0305 11/03/19 0355  WBC 5.5 5.0 5.6 4.8 4.6  HGB 8.9* 8.3* 8.8* 9.1* 8.4*  HCT 29.5* 28.0* 28.8* 30.3* 28.2*  MCV 82.2 83.6 82.5 84.2 84.9  PLT 155 153 168 171 845   Basic Metabolic Panel: Recent Labs  Lab 10/29/19 0618  10/30/19 0203 10/31/19 0319 11/01/19 0400 11/02/19 0305 11/02/19 0943 11/03/19 0355  NA 133*  --  133* 134* 135  --  134*  --   K 3.9  --  4.3 4.0 4.6  --  4.3  --   CL 97*  --  96* 98 97*  --  96*  --   CO2 27  --  27 26 27   --  24  --   GLUCOSE 83  --  87 89 87  --  140*  --   BUN 14  --  23 19 27*  --  32*  --   CREATININE 3.32*  --  4.15* 3.86* 4.89*  --  5.58*  --   CALCIUM 8.9  --  8.8* 8.8* 9.3  --  9.5  --   MG  --    <  > 2.1 2.1 2.1 2.2  --  2.0  PHOS 2.7  --  2.8 2.8 2.9  --  3.3  --    < > = values in this interval not displayed.   GFR: Estimated Creatinine Clearance: 10.9 mL/min (A) (by C-G formula based on SCr of 5.58 mg/dL (H)). Liver Function Tests: Recent Labs  Lab 10/29/19 0618 10/30/19 0203 10/31/19 0319 11/01/19 0400 11/02/19 0943  ALBUMIN 2.8* 2.9* 2.8* 3.0* 2.9*   No results for input(s): LIPASE, AMYLASE in the last 168 hours. No results for input(s): AMMONIA in the last 168 hours. Coagulation Profile: No results for input(s): INR, PROTIME in the last 168 hours. Cardiac Enzymes: No results for input(s): CKTOTAL, CKMB, CKMBINDEX, TROPONINI in the last 168 hours. BNP (last 3 results) No results for input(s): PROBNP in the last 8760 hours. HbA1C: No results for input(s): HGBA1C in the last 72 hours. CBG: No results for input(s): GLUCAP in the last 168 hours. Lipid Profile: No results for input(s): CHOL, HDL,  LDLCALC, TRIG, CHOLHDL, LDLDIRECT in the last 72 hours. Thyroid Function Tests: No results for input(s): TSH, T4TOTAL, FREET4, T3FREE, THYROIDAB in the last 72 hours. Anemia Panel: No results for input(s): VITAMINB12, FOLATE, FERRITIN, TIBC, IRON, RETICCTPCT in the last 72 hours. Sepsis Labs: No results for input(s): PROCALCITON, LATICACIDVEN in the last 168 hours.  Recent Results (from the past 240 hour(s))  Culture, blood (routine x 2)     Status: None   Collection Time: 10/29/19 10:06 AM   Specimen: BLOOD LEFT HAND  Result Value Ref Range Status   Specimen Description BLOOD LEFT HAND  Final   Special Requests   Final    BOTTLES DRAWN AEROBIC AND ANAEROBIC Blood Culture adequate volume   Culture   Final    NO GROWTH 5 DAYS Performed at [facility], [address] [place], [place] [address]    Report Status 11/03/2019 FINAL  Final  Culture, blood (routine x 2)     Status: None   Collection Time: 10/29/19 10:07 AM   Specimen: BLOOD LEFT ARM  Result Value Ref  Range Status   Specimen Description BLOOD LEFT ARM  Final   Special Requests   Final    BOTTLES DRAWN AEROBIC AND ANAEROBIC Blood Culture adequate volume   Culture   Final    NO GROWTH 5 DAYS Performed at [facility], [place] [address] [place], [place] [address]    Report Status 11/03/2019 FINAL  Final  MRSA PCR Screening     Status: None   Collection Time: 11/01/19  2:45 PM   Specimen: Nasopharyngeal  Result Value Ref Range Status   MRSA by PCR NEGATIVE NEGATIVE Final    Comment:        The GeneXpert MRSA Assay (FDA approved for NASAL specimens only), is one component of a comprehensive MRSA colonization surveillance program. It is not intended to diagnose MRSA infection nor to guide or monitor treatment for MRSA infections. Performed at [facility], [place] [address] [place], [place] [address]          Radiology Studies: Vas [place] Upper Ext Vein Mapping (pre-op Avf)  Result Date: 11/03/2019 UPPER EXTREMITY VEIN MAPPING  Indications: End stage renal disease. Limitations: left upper arm bandages and tape from line that is in place Comparison Study: No priors. Performing Technologist: Oda Cogan RDMS, RVT  Examination Guidelines: A complete evaluation includes B-mode imaging, spectral Doppler, color Doppler, and power Doppler as needed of all accessible portions of each vessel. Bilateral testing is considered an integral part of a complete examination. Limited examinations for reoccurring indications may be performed as noted. +-----------------+-------------+----------+-----------------------------------+ Right Cephalic   Diameter (cm)Depth (cm)             Findings               +-----------------+-------------+----------+-----------------------------------+ Shoulder             0.30        1.10                                       +-----------------+-------------+----------+-----------------------------------+ Prox upper arm       0.33        1.00                                        +-----------------+-------------+----------+-----------------------------------+  Mid upper arm        0.29        1.00                branching              +-----------------+-------------+----------+-----------------------------------+ Dist upper arm       2.90        1.20    tapered down to 0.14 size in the                                                   distal segment also         +-----------------+-------------+----------+-----------------------------------+ Antecubital fossa    0.18        0.49                                       +-----------------+-------------+----------+-----------------------------------+ Prox forearm         0.22        0.77                                       +-----------------+-------------+----------+-----------------------------------+ Mid forearm          0.26        0.33                                       +-----------------+-------------+----------+-----------------------------------+ Dist forearm         0.21        0.21                                       +-----------------+-------------+----------+-----------------------------------+ Wrist                0.13                                                   +-----------------+-------------+----------+-----------------------------------+ +-----------------+-------------+----------+--------------+ Right Basilic    Diameter (cm)Depth (cm)   Findings    +-----------------+-------------+----------+--------------+ Shoulder                                not visualized +-----------------+-------------+----------+--------------+ Prox upper arm       0.58                              +-----------------+-------------+----------+--------------+ Mid upper arm        0.50                 branching    +-----------------+-------------+----------+--------------+ Dist upper arm       0.35                               +-----------------+-------------+----------+--------------+ Antecubital  fossa    0.23                              +-----------------+-------------+----------+--------------+ Prox forearm         0.20                              +-----------------+-------------+----------+--------------+ Mid forearm                             not visualized +-----------------+-------------+----------+--------------+ Distal forearm                          not visualized +-----------------+-------------+----------+--------------+ Elbow                                   not visualized +-----------------+-------------+----------+--------------+ +-----------------+-------------+----------+---------+ Left Cephalic    Diameter (cm)Depth (cm)Findings  +-----------------+-------------+----------+---------+ Shoulder             0.36        1.10             +-----------------+-------------+----------+---------+ Prox upper arm       0.31        0.68             +-----------------+-------------+----------+---------+ Mid upper arm        0.24        1.00             +-----------------+-------------+----------+---------+ Dist upper arm       0.23        1.00             +-----------------+-------------+----------+---------+ Antecubital fossa    0.47        0.57   branching +-----------------+-------------+----------+---------+ Prox forearm         0.15        0.84             +-----------------+-------------+----------+---------+ Mid forearm          0.24        0.48             +-----------------+-------------+----------+---------+ Dist forearm         0.20        0.38             +-----------------+-------------+----------+---------+ Wrist                0.16                         +-----------------+-------------+----------+---------+ +-----------------+-------------+---------+-----------------------------------+ Left Basilic     Diameter (cm)  Depth                Findings                                               (cm)                                       +-----------------+-------------+---------+-----------------------------------+ Prox upper arm       0.79                                                  +-----------------+-------------+---------+-----------------------------------+  Mid upper arm        0.45                                                  +-----------------+-------------+---------+-----------------------------------+ Dist upper arm                              tape and bandages and not                                                         visualized              +-----------------+-------------+---------+-----------------------------------+ Antecubital fossa    0.20                                                  +-----------------+-------------+---------+-----------------------------------+ Prox forearm         0.23                                                  +-----------------+-------------+---------+-----------------------------------+ Mid forearm                                      not visualized            +-----------------+-------------+---------+-----------------------------------+ Distal forearm                                   not visualized            +-----------------+-------------+---------+-----------------------------------+ Elbow                                            not visualized            +-----------------+-------------+---------+-----------------------------------+ *See table(s) above for measurements and observations.  Diagnosing physician:    Preliminary         Scheduled Meds: . allopurinol  200 mg Oral Daily  . atorvastatin  40 mg Oral Daily  . Chlorhexidine Gluconate Cloth  6 each Topical Daily  . Chlorhexidine Gluconate Cloth  6 each Topical Q0600  . feeding supplement (NEPRO CARB STEADY)  237 mL Oral Q24H  . mouth rinse  15 mL Mouth Rinse BID   . metoprolol succinate  12.5 mg Oral BID  . multivitamin  1 tablet Oral QHS  . vitamin C  500 mg Oral Daily  . zinc sulfate  220 mg Oral Daily   Continuous Infusions: . sodium chloride Stopped (10/25/19 1258)  . sodium chloride Stopped (11/01/19 1500)  . sodium chloride    . ceFEPime (MAXIPIME) IV 2 g (11/02/19 1532)  . heparin Stopped (  11/03/19 1101)     LOS: 22 days     Cordelia Poche, MD [facility] 11/03/2019, 2:43 PM  If 7PM-7AM, please contact night-coverage [email or web address]

## 2019-11-03 NOTE — Plan of Care (Signed)
Patient aware of procedure and plan for moving forward with discharge planning Problem: Health Behavior/Discharge Planning: Goal: Ability to manage health-related needs will improve Outcome: Progressing   Problem: Pain Managment: Goal: General experience of comfort will improve Outcome: Progressing

## 2019-11-04 ENCOUNTER — Encounter (HOSPITAL_COMMUNITY)
Admission: EM | Disposition: A | Discharge status: Home Health | Payer: Self-pay | Source: Home / Self Care | Attending: Internal Medicine

## 2019-11-04 ENCOUNTER — Inpatient Hospital Stay (HOSPITAL_COMMUNITY): Payer: Medicare Other

## 2019-11-04 ENCOUNTER — Inpatient Hospital Stay (HOSPITAL_COMMUNITY): Payer: Medicare Other | Admitting: Registered Nurse

## 2019-11-04 DIAGNOSIS — N186 End stage renal disease: Secondary | ICD-10-CM

## 2019-11-04 DIAGNOSIS — Z992 Dependence on renal dialysis: Secondary | ICD-10-CM

## 2019-11-04 HISTORY — PX: INSERTION OF DIALYSIS CATHETER: SHX1324

## 2019-11-04 HISTORY — PX: AV FISTULA PLACEMENT: SHX1204

## 2019-11-04 LAB — MAGNESIUM: Magnesium: 2.1 mg/dL (ref 1.7–2.4)

## 2019-11-04 LAB — SARS CORONAVIRUS 2 (TAT 6-24 HRS): SARS Coronavirus 2: NEGATIVE

## 2019-11-04 LAB — HEPARIN LEVEL (UNFRACTIONATED): Heparin Unfractionated: 0.13 IU/mL — ABNORMAL LOW (ref 0.30–0.70)

## 2019-11-04 LAB — SURGICAL PCR SCREEN
MRSA, PCR: NEGATIVE
Staphylococcus aureus: NEGATIVE

## 2019-11-04 SURGERY — INSERTION OF DIALYSIS CATHETER
Anesthesia: General | Site: Chest | Laterality: Right

## 2019-11-04 MED ORDER — OXYCODONE HCL 5 MG PO TABS: 5.0000 mg | ORAL_TABLET | Freq: Once | ORAL | Status: DC | PRN

## 2019-11-04 MED ORDER — FENTANYL CITRATE (PF) 100 MCG/2ML IJ SOLN
INTRAMUSCULAR | Status: DC | PRN
  Administered 2019-11-04: 25 ug via INTRAVENOUS
  Administered 2019-11-04: 50 ug via INTRAVENOUS
  Administered 2019-11-04: 25 ug via INTRAVENOUS

## 2019-11-04 MED ORDER — FENTANYL CITRATE (PF) 100 MCG/2ML IJ SOLN: INTRAMUSCULAR | Status: DC | PRN

## 2019-11-04 MED ORDER — SODIUM CHLORIDE 0.9 % IV SOLN
INTRAVENOUS | Status: AC
  Filled 2019-11-04: qty 1.2

## 2019-11-04 MED ORDER — LIDOCAINE HCL (PF) 1 % IJ SOLN
INTRAMUSCULAR | Status: AC
  Filled 2019-11-04: qty 30

## 2019-11-04 MED ORDER — HEPARIN SODIUM (PORCINE) 1000 UNIT/ML IJ SOLN
INTRAMUSCULAR | Status: DC | PRN
  Administered 2019-11-04: 3000 [IU] via INTRAVENOUS

## 2019-11-04 MED ORDER — PHENYLEPHRINE 40 MCG/ML (10ML) SYRINGE FOR IV PUSH (FOR BLOOD PRESSURE SUPPORT)
PREFILLED_SYRINGE | INTRAVENOUS | Status: DC | PRN
  Administered 2019-11-04 (×3): 80 ug via INTRAVENOUS

## 2019-11-04 MED ORDER — HEPARIN SODIUM (PORCINE) 1000 UNIT/ML IJ SOLN
INTRAMUSCULAR | Status: AC
  Filled 2019-11-04: qty 4

## 2019-11-04 MED ORDER — FENTANYL CITRATE (PF) 100 MCG/2ML IJ SOLN
INTRAMUSCULAR | Status: AC
  Filled 2019-11-04: qty 2

## 2019-11-04 MED ORDER — PROPOFOL 10 MG/ML IV BOLUS
INTRAVENOUS | Status: AC
  Filled 2019-11-04: qty 20

## 2019-11-04 MED ORDER — PHENYLEPHRINE 40 MCG/ML (10ML) SYRINGE FOR IV PUSH (FOR BLOOD PRESSURE SUPPORT)
PREFILLED_SYRINGE | INTRAVENOUS | Status: AC
  Filled 2019-11-04: qty 10

## 2019-11-04 MED ORDER — HEPARIN SODIUM (PORCINE) 1000 UNIT/ML IJ SOLN
INTRAMUSCULAR | Status: AC
  Filled 2019-11-04: qty 1

## 2019-11-04 MED ORDER — HEPARIN SODIUM (PORCINE) 1000 UNIT/ML DIALYSIS
40.0000 [IU]/kg | INTRAMUSCULAR | Status: DC | PRN
  Filled 2019-11-04: qty 5

## 2019-11-04 MED ORDER — FENTANYL CITRATE (PF) 250 MCG/5ML IJ SOLN
INTRAMUSCULAR | Status: AC
  Filled 2019-11-04: qty 5

## 2019-11-04 MED ORDER — ONDANSETRON HCL 4 MG/2ML IJ SOLN: 4.0000 mg | Freq: Four times a day (QID) | INTRAMUSCULAR | Status: DC | PRN

## 2019-11-04 MED ORDER — DEXAMETHASONE SODIUM PHOSPHATE 10 MG/ML IJ SOLN
INTRAMUSCULAR | Status: AC
  Filled 2019-11-04: qty 1

## 2019-11-04 MED ORDER — DEXAMETHASONE SODIUM PHOSPHATE 10 MG/ML IJ SOLN
INTRAMUSCULAR | Status: DC | PRN
  Administered 2019-11-04: 5 mg via INTRAVENOUS

## 2019-11-04 MED ORDER — SODIUM CHLORIDE 0.9 % IV SOLN
INTRAVENOUS | Status: DC | PRN
  Administered 2019-11-04: 500 mL

## 2019-11-04 MED ORDER — LIDOCAINE 2% (20 MG/ML) 5 ML SYRINGE
INTRAMUSCULAR | Status: AC
  Filled 2019-11-04: qty 5

## 2019-11-04 MED ORDER — HEPARIN SODIUM (PORCINE) 1000 UNIT/ML IJ SOLN
INTRAMUSCULAR | Status: DC | PRN
  Administered 2019-11-04: 3.4 [IU] via INTRAVENOUS

## 2019-11-04 MED ORDER — 0.9 % SODIUM CHLORIDE (POUR BTL) OPTIME
TOPICAL | Status: DC | PRN
  Administered 2019-11-04: 1000 mL

## 2019-11-04 MED ORDER — OXYCODONE HCL 5 MG/5ML PO SOLN: 5.0000 mg | Freq: Once | ORAL | Status: DC | PRN

## 2019-11-04 MED ORDER — PROPOFOL 10 MG/ML IV BOLUS
INTRAVENOUS | Status: DC | PRN
  Administered 2019-11-04: 20 mg via INTRAVENOUS
  Administered 2019-11-04: 110 mg via INTRAVENOUS

## 2019-11-04 MED ORDER — SODIUM CHLORIDE 0.9 % IV SOLN
INTRAVENOUS | Status: DC | PRN
  Administered 2019-11-04: 07:00:00 via INTRAVENOUS

## 2019-11-04 MED ORDER — ONDANSETRON HCL 4 MG/2ML IJ SOLN
INTRAMUSCULAR | Status: AC
  Filled 2019-11-04: qty 2

## 2019-11-04 MED ORDER — ONDANSETRON HCL 4 MG/2ML IJ SOLN
INTRAMUSCULAR | Status: DC | PRN
  Administered 2019-11-04: 4 mg via INTRAVENOUS

## 2019-11-04 MED ORDER — FENTANYL CITRATE (PF) 100 MCG/2ML IJ SOLN
25.0000 ug | INTRAMUSCULAR | Status: DC | PRN
  Administered 2019-11-04 (×2): 50 ug via INTRAVENOUS

## 2019-11-04 SURGICAL SUPPLY — 56 items
BAG DECANTER FOR FLEXI CONT (MISCELLANEOUS) ×3 IMPLANT
BIOPATCH RED 1 DISK 7.0 (GAUZE/BANDAGES/DRESSINGS) ×3 IMPLANT
CATH PALINDROME RT-P 15FX19CM (CATHETERS) IMPLANT
CATH PALINDROME RT-P 15FX23CM (CATHETERS) ×3 IMPLANT
CATH PALINDROME RT-P 15FX28CM (CATHETERS) IMPLANT
CATH PALINDROME RT-P 15FX55CM (CATHETERS) IMPLANT
CATH STRAIGHT 5FR 65CM (CATHETERS) IMPLANT
CLIP VESOCCLUDE MED 6/CT (CLIP) ×3 IMPLANT
CLIP VESOCCLUDE SM WIDE 6/CT (CLIP) ×6 IMPLANT
COVER PROBE W GEL 5X96 (DRAPES) ×3 IMPLANT
COVER SURGICAL LIGHT HANDLE (MISCELLANEOUS) ×3 IMPLANT
COVER WAND RF STERILE (DRAPES) ×3 IMPLANT
DECANTER SPIKE VIAL GLASS SM (MISCELLANEOUS) ×3 IMPLANT
DERMABOND ADVANCED (GAUZE/BANDAGES/DRESSINGS) ×3
DERMABOND ADVANCED .7 DNX12 (GAUZE/BANDAGES/DRESSINGS) ×6 IMPLANT
DRAPE C-ARM 42X72 X-RAY (DRAPES) ×3 IMPLANT
DRAPE CHEST BREAST 15X10 FENES (DRAPES) ×3 IMPLANT
GAUZE 4X4 16PLY RFD (DISPOSABLE) ×3 IMPLANT
GLOVE BIO SURGEON STRL SZ 6.5 (GLOVE) ×12 IMPLANT
GLOVE BIO SURGEON STRL SZ7.5 (GLOVE) ×3 IMPLANT
GLOVE BIOGEL PI IND STRL 6 (GLOVE) ×2 IMPLANT
GLOVE BIOGEL PI IND STRL 6.5 (GLOVE) ×2 IMPLANT
GLOVE BIOGEL PI IND STRL 7.0 (GLOVE) ×2 IMPLANT
GLOVE BIOGEL PI IND STRL 7.5 (GLOVE) ×2 IMPLANT
GLOVE BIOGEL PI IND STRL 8 (GLOVE) ×2 IMPLANT
GLOVE BIOGEL PI INDICATOR 6 (GLOVE) ×1
GLOVE BIOGEL PI INDICATOR 6.5 (GLOVE) ×1
GLOVE BIOGEL PI INDICATOR 7.0 (GLOVE) ×1
GLOVE BIOGEL PI INDICATOR 7.5 (GLOVE) ×1
GLOVE BIOGEL PI INDICATOR 8 (GLOVE) ×1
GOWN STRL REUS W/ TWL LRG LVL3 (GOWN DISPOSABLE) ×4 IMPLANT
GOWN STRL REUS W/ TWL XL LVL3 (GOWN DISPOSABLE) ×4 IMPLANT
GOWN STRL REUS W/TWL LRG LVL3 (GOWN DISPOSABLE) ×2
GOWN STRL REUS W/TWL XL LVL3 (GOWN DISPOSABLE) ×2
KIT BASIN OR (CUSTOM PROCEDURE TRAY) ×3 IMPLANT
KIT TURNOVER KIT B (KITS) ×3 IMPLANT
NEEDLE 18GX1X1/2 (RX/OR ONLY) (NEEDLE) ×3 IMPLANT
NEEDLE HYPO 25GX1X1/2 BEV (NEEDLE) ×3 IMPLANT
NS IRRIG 1000ML POUR BTL (IV SOLUTION) ×3 IMPLANT
PACK SURGICAL SETUP 50X90 (CUSTOM PROCEDURE TRAY) ×3 IMPLANT
PAD ARMBOARD 7.5X6 YLW CONV (MISCELLANEOUS) ×6 IMPLANT
SET MICROPUNCTURE 5F STIFF (MISCELLANEOUS) IMPLANT
SOAP 2 % CHG 4 OZ (WOUND CARE) ×3 IMPLANT
SUT ETHILON 3 0 PS 1 (SUTURE) ×6 IMPLANT
SUT MNCRL AB 4-0 PS2 18 (SUTURE) ×6 IMPLANT
SUT PROLENE 6 0 BV (SUTURE) ×6 IMPLANT
SUT VIC AB 3-0 SH 27 (SUTURE) ×1
SUT VIC AB 3-0 SH 27X BRD (SUTURE) ×2 IMPLANT
SYR 10ML LL (SYRINGE) ×3 IMPLANT
SYR 20ML LL LF (SYRINGE) ×6 IMPLANT
SYR 5ML LL (SYRINGE) ×3 IMPLANT
SYR CONTROL 10ML LL (SYRINGE) ×3 IMPLANT
TOWEL GREEN STERILE (TOWEL DISPOSABLE) ×3 IMPLANT
TOWEL GREEN STERILE FF (TOWEL DISPOSABLE) ×3 IMPLANT
WATER STERILE IRR 1000ML POUR (IV SOLUTION) ×3 IMPLANT
WIRE AMPLATZ SS-J .035X180CM (WIRE) IMPLANT

## 2019-11-04 NOTE — Progress Notes (Signed)
PROGRESS NOTE  Melissa Adams LJQ:[id number] DOB: Jul 21, 1946 DOA: 10/11/2019 PCP: System, Provider Not In  Brief History   Melissa Adams is a 73 y.o. female with a history of of heart failure, hypertension, OSA, severe MR/TR, CKD 3, obesity. She presented secondary to dyspnea and found to have acute heart failure. Patient also with COVID-19, treated with Remdesivir/Decadron. 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  Vascular surgery placed temporary dialysis catheter in the patient today. She then went to HD.   Consultants  . Vascular surgery . Nephrology . Heart Failure . Cardiology  Procedures  . Placement of temporary dialysis catheter . HD  Antibiotics   Anti-infectives (From admission, onward)   Start     Dose/Rate Route Frequency Ordered Stop   11/02/19 1200  ceFEPIme (MAXIPIME) 2 g in sodium chloride 0.9 % 100 mL IVPB     2 g 200 mL/hr over 30 Minutes Intravenous Every T-Th-Sa (Hemodialysis) 10/30/19 1439 11/04/19 2359   10/30/19 1530  ceFEPIme (MAXIPIME) 2 g in sodium chloride 0.9 % 100 mL IVPB     2 g 200 mL/hr over 30 Minutes Intravenous  Once 10/30/19 1439 10/30/19 2327   10/15/19 1600  remdesivir 100 mg in sodium chloride 0.9 % 250 mL IVPB     100 mg 500 mL/hr over 30 Minutes Intravenous Every 24 hours 10/15/19 1020 10/16/19 1737   10/15/19 1200  remdesivir 100 mg in sodium chloride 0.9 % 250 mL IVPB  Status:  Discontinued     100 mg 500 mL/hr over 30 Minutes Intravenous Every 24 hours 10/15/19 1018 10/15/19 1020   10/13/19 1000  remdesivir 100 mg in sodium chloride 0.9 % 250 mL IVPB  Status:  Discontinued     100 mg 500 mL/hr over 30 Minutes Intravenous Every 24 hours 10/12/19 0600 10/14/19 1646   10/12/19 0700  remdesivir 200 mg in sodium chloride 0.9 % 250 mL IVPB     200 mg 500 mL/hr over 30 Minutes Intravenous Once 10/12/19 0600 10/12/19 0908    .  Subjective  The  patient is complaining of pain in her arm at the site of catheter placement. She is otherwise without new complaints.  Objective   Vitals:  Vitals:   11/04/19 1530 11/04/19 1600  BP: (!) 122/37 (!) 98/58  Pulse: 67 85  Resp: 20 20  Temp:    SpO2:     Exam:  Constitutional:  . The patient is awake, alert, and oriented x 3. Mild distress from pain at temporary catheter placement site. Respiratory:  . No increased work of breathing. . No wheezes, rales, or rhonchi . No tactile fremitus Cardiovascular:  . Regular rate and rhythm . No murmurs, ectopy, or gallups. . No lateral PMI. No thrills. Abdomen:  . Abdomen is soft, non-tender, non-distended . No hernias, masses, or organomegaly . Normoactive bowel sounds.  Musculoskeletal:  . No cyanosis, clubbing, or edema Skin:  . No rashes, lesions, ulcers . palpation of skin: no induration or nodules Neurologic:  . CN 2-12 intact . Sensation all 4 extremities intact Psychiatric:  . Mental status o Mood, affect appropriate   I have personally reviewed the following:   Today's Data  . Vitals,   Micro Data  . Covid negative 11/03/2019, previously positive.  Scheduled Meds: . allopurinol  200 mg Oral Daily  . atorvastatin  40 mg Oral Daily  . Chlorhexidine Gluconate Cloth  6 each Topical  Daily  . Chlorhexidine Gluconate Cloth  6 each Topical Q0600  . feeding supplement (NEPRO CARB STEADY)  237 mL Oral Q24H  . fentaNYL      . mouth rinse  15 mL Mouth Rinse BID  . metoprolol succinate  12.5 mg Oral BID  . multivitamin  1 tablet Oral QHS  . vitamin C  500 mg Oral Daily  . zinc sulfate  220 mg Oral Daily   Continuous Infusions: . sodium chloride Stopped (10/25/19 1258)  . ceFEPime (MAXIPIME) IV 2 g (11/02/19 1532)  . heparin Stopped (11/04/19 1941)    Active Problems:   Acute CHF (congestive heart failure) (HCC)   Atrial fibrillation, chronic (HCC)   Essential hypertension   Sleep apnea   AKI (acute kidney injury)  (HCC)   Gout   Normocytic anemia   CHF exacerbation (HCC)   Dyspnea   COVID-19 virus infection   Pressure injury of skin   Acute on chronic systolic heart failure ([place])   LOS: 23 days   A & P  Acute on chronic combined systolic and diastolic heart failure:  EF of 20-25%. Initially managed on milrinone, Lasix by heart failure team. Now on HD for vomume management.  AKI on CKD stage III: Anuric. Temporary dialysis catheter placed today. Now with transition to ESRD per nephrology assessment. Previously managed on CRRT now requiring intermittent HD. HD today after catheter placement. HD per nephrology. Outpatient HD pending. Nephrology assistance is appreciated.  Hyponatremia: Low of 119. Improved to 134 on 11/02/2019. Treated with Tolvaptan and CRRT.  Chronic atrial fibrillation: Patient initially managed on amiodarone with attempt for DCCV x3, unsuccessful. Currently on metoprolol. Rate controlled. On Eliquis as an outpatient. Currently on heparin drip for procedures. Continue Heparin drip; transition back to Eliquis once no procedures planned. Continue metoprolol.  COVID-19 infection: Patient treated with Remdesivir and Dexamethasone. Symptoms resolved. Now off of isolation. Tested negative on 11/03/2019.  Right LL pneumonia: Cefepime x5 days. Completed 11/04/2019.  Anemia of chronic disease: In setting of kidney disease. As per nephrology.  Essential hypertension: Normotensive/slightly soft. On metoprolol for below.  Severe MR/TR: Noted. Outpatient cardiology follow-up.  Obstructive sleep apnea: Continue CPAP as at home.  Stage II pressure injury: Right medial labia, not present on admission.  I have seen and examined this patient myself. I have spent 30 minutes in her evaluation and care.   DVT prophylaxis: Heparin drip Code Status:   Code Status: Full Code Family Communication: None Disposition Plan: Discharge pending Vascular surgery management and outpatient  HD Melissa Christon, DO [facility] Direct contact: see [email or web address]  7PM-7AM contact night coverage as above 11/04/2019, 5:46 PM  LOS: 23 days

## 2019-11-04 NOTE — Progress Notes (Signed)
Nutrition Follow-up  DOCUMENTATION CODES:   Morbid obesity  INTERVENTION:   Once diet is resumed:  -Continue 220 mg zinc sulfate daily -Continue 500 mg vitamin C daily -Continue renal MVI daily -Continue Nepro Shake po daily, each supplement provides 425 kcal and 19 grams protein  NUTRITION DIAGNOSIS:   Increased nutrient needs related to acute illness(COVID-19) as evidenced by estimated needs.  Ongoing  GOAL:   Patient will meet greater than or equal to 90% of their needs  Progressing  MONITOR:   PO intake, Supplement acceptance, Labs, Weight trends, Skin, I & O's  REASON FOR ASSESSMENT:   LOS    ASSESSMENT:   73 year old female who presented to the ED on 11/16 with LE edema and SOB. PMH of atrial fibrillation, cardiomyopathy, CHF, HTN. Pt admitted with acute systolic CHF exacerbation and AKI on CKD III. Pt also tested positive for COVID-19.  11/19 - PICC line placed 11/23 - CRRT initiated 11/29 - CRRT d/c 12/01 - TEE, cardioversion, first iHD  Reviewed I/O's: +749 ml x 24 hurs and -6.8 L since 10/21/19  UOP: 500 ml x 24 hours  Per nephrology notes, pt has progressed for ESRD and transitioning to TTS schedule.   Pt currently in OR at time of visit. Per VVS, plan for exchange of temporary HD cath with lt arm AVF vs graft.   Pt remains with good appetite. Noted meal completion 50-100%. Pt is also consuming Nepro shake per MAR.  Labs reviewed..  Diet Order:   Diet Order            Diet NPO time specified Except for: Sips with Meds  Diet effective midnight              EDUCATION NEEDS:   Education needs have been addressed  Skin:  Skin Assessment: Skin Integrity Issues: Skin Integrity Issues:: Stage II Stage II: right labia  Last BM:  11/03/19  Height:   Ht Readings from Last 1 Encounters:  11/02/19 5\' 3"  (1.6 m)    Weight:   Wt Readings from Last 1 Encounters:  11/04/19 111.5 kg    Ideal Body Weight:  52.3 kg  BMI:  Body mass  index is 43.56 kg/m.  Estimated Nutritional Needs:   Kcal:  1900-2100  Protein:  105-120 grams  Fluid:  UOP + 1000 ml    Draco Malczewski A. Jimmye Norman, RD, LDN, [place] Registered Dietitian II Certified Diabetes Care and Education Specialist Pager: [phone] After hours Pager: [phone]

## 2019-11-04 NOTE — Progress Notes (Signed)
Renal Navigator spoke with patient's daughter in the afternoon of 11/03/19 to discuss OP HD referral. She confirms what Case Manager stated, that patient will be moving in with daughter in [place]. They are hopefull for HD at [facility], the clinic closest to their home. Daughter states she does not have patient's SSN. Renal Navigator should be able to access it from CSW or CM. Renal Navigator explained OP HD schedule in general, and that patient's schedule will depend on what clinic has available. Patient's daughter states she will provide transportation. Daughter had questions about perm cath and access. Navigator answered in general terms. Daughter appreciative. CSW notes that SSN is not in system. Navigator contacted daughter back who was able to locate it and provide to Navigator. Renal Navigator notes that patient had a negative COVID test on 11/03/19 and requested that a second test be collected as [facility] requires 2 negative tests for acceptance to a negative shift.  Renal Navigator submitted OP HD referral to [facility]missions with request for treatment at [facility] (barring any further positive COVID tests) and will continue to follow.  Alphonzo Cruise, [place] Renal Navigator [phone]

## 2019-11-04 NOTE — Anesthesia Procedure Notes (Signed)
Procedure Name: LMA Insertion Date/Time: 11/04/2019 7:48 AM Performed by: Trinna Post., CRNA Pre-anesthesia Checklist: Patient identified, Emergency Drugs available, Suction available, Patient being monitored and Timeout performed Patient Re-evaluated:Patient Re-evaluated prior to induction Oxygen Delivery Method: Circle system utilized Preoxygenation: Pre-oxygenation with 100% oxygen Induction Type: IV induction Ventilation: Mask ventilation without difficulty LMA: LMA inserted LMA Size: 4.0 Number of attempts: 1 Placement Confirmation: positive ETCO2 and breath sounds checked- equal and bilateral Tube secured with: Tape Dental Injury: Teeth and Oropharynx as per pre-operative assessment

## 2019-11-04 NOTE — Anesthesia Preprocedure Evaluation (Signed)
Anesthesia Evaluation  Patient identified by MRN, date of birth, ID band Patient awake    Reviewed: Allergy & Precautions, H&P , NPO status , Patient's Chart, lab work & pertinent test results  Airway Mallampati: II   Neck ROM: full    Dental   Pulmonary shortness of breath, sleep apnea ,    breath sounds clear to auscultation       Cardiovascular hypertension, +CHF  + dysrhythmias Atrial Fibrillation  Rhythm:regular Rate:Normal  Pulmonary HTN   Neuro/Psych    GI/Hepatic   Endo/Other    Renal/GU ESRF and DialysisRenal disease     Musculoskeletal   Abdominal   Peds  Hematology  (+) Blood dyscrasia, anemia ,   Anesthesia Other Findings   Reproductive/Obstetrics                             Anesthesia Physical Anesthesia Plan  ASA: IV  Anesthesia Plan: General   Post-op Pain Management:    Induction:   PONV Risk Score and Plan: 3 and Ondansetron and Dexamethasone  Airway Management Planned: LMA  Additional Equipment:   Intra-op Plan:   Post-operative Plan:   Informed Consent: I have reviewed the patients History and Physical, chart, labs and discussed the procedure including the risks, benefits and alternatives for the proposed anesthesia with the patient or authorized representative who has indicated his/her understanding and acceptance.       Plan Discussed with: CRNA, Anesthesiologist and Surgeon  Anesthesia Plan Comments:         Anesthesia Quick Evaluation

## 2019-11-04 NOTE — Progress Notes (Signed)
Patient IV heparin not present upon assessment.  Patient stated she heard that it had been discontinued.  Heparin still on the [facility] for 11/hr.  TRH notified and pharmacy called for clarification.  No indication that heparin should not be running. Per pharmacy and TRH patient heparin should be restarted.    Only access patient has is right IJ for HD with "pig tail" and both upper extremities are restricted.  Spoke with IV team who stated that it was okay to run heparin through the "pig tail" but to contact pharmacy regarding lab draws in the morning.  Pharmacy suggested since we have minimal options, to draw from the IJ after stopping and flushing with 30cc of saline.

## 2019-11-04 NOTE — Discharge Instructions (Signed)
° °  [facility] [place] ° °Discharge Instructions ° °AV Fistula or Graft Surgery for Dialysis Access ° °Please refer to the following instructions for your post-procedure care. Your surgeon or physician assistant will discuss any changes with you. ° °Activity ° °You may drive the day following your surgery, if you are comfortable and no longer taking prescription pain medication. Resume full activity as the soreness in your incision resolves. ° °Bathing/Showering ° °You may shower after you go home. Keep your incision dry for 48 hours. Do not soak in a bathtub, hot tub, or swim until the incision heals completely. You may not shower if you have a hemodialysis catheter. ° °Incision Care ° °Clean your incision with mild soap and water after 48 hours. Pat the area dry with a clean towel. You do not need a bandage unless otherwise instructed. Do not apply any ointments or creams to your incision. You may have skin glue on your incision. Do not peel it off. It will come off on its own in about one week. Your arm may swell a bit after surgery. To reduce swelling use pillows to elevate your arm so it is above your heart. Your doctor will tell you if you need to lightly wrap your arm with an ACE bandage. ° °Diet ° °Resume your normal diet. There are not special food restrictions following this procedure. In order to heal from your surgery, it is CRITICAL to get adequate nutrition. Your body requires vitamins, minerals, and protein. Vegetables are the best source of vitamins and minerals. Vegetables also provide the perfect balance of protein. Processed food has little nutritional value, so try to avoid this. ° °Medications ° °Resume taking all of your medications. If your incision is causing pain, you may take over-the counter pain relievers such as acetaminophen (Tylenol). If you were prescribed a stronger pain medication, please be aware these medications can cause nausea and constipation. Prevent  nausea by taking the medication with a snack or meal. Avoid constipation by drinking plenty of fluids and eating foods with high amount of fiber, such as fruits, vegetables, and grains. Do not take Tylenol if you are taking prescription pain medications. ° ° ° ° °Follow up °Your surgeon may want to see you in the office following your access surgery. If so, this will be arranged at the time of your surgery. ° °Please call us immediately for any of the following conditions: ° °Increased pain, redness, drainage (pus) from your incision site °Fever of 101 degrees or higher °Severe or worsening pain at your incision site °Hand pain or numbness. ° °Reduce your risk of vascular disease: ° °Stop smoking. If you would like help, call [facility] at 1-800-QUIT-NOW (1-800-784-8669) or [employer] at 336-586-4000 ° °Manage your cholesterol °Maintain a desired weight °Control your diabetes °Keep your blood pressure down ° °Dialysis ° °It will take several weeks to several months for your new dialysis access to be ready for use. Your surgeon will determine when it is OK to use it. Your nephrologist will continue to direct your dialysis. You can continue to use your Permcath until your new access is ready for use. ° °If you have any questions, please call the office at [phone]. ° °

## 2019-11-04 NOTE — Op Note (Signed)
OPERATIVE NOTE   PROCEDURE: 1.  Exchange of right internal jugular vein temporary dialysis catheter for a tunneled dialysis catheter (23 cm palindrome) 2. Left brachiocephalic arteriovenous fistula placement  PRE-OPERATIVE DIAGNOSIS: ESRD  POST-OPERATIVE DIAGNOSIS: same as above   SURGEON: [name], MD  ASSISTANT(S): Arlee Muslim, PA  ANESTHESIA: LMA  ESTIMATED BLOOD LOSS: Minimal  FINDING(S): 1.  A wire was placed down the temporary right IJ dialysis catheter and exchanged for a 23 cm tunneled palindrome catheter with the tip at the cavoatrial junction 2.  Cephalic vein: 4-5 mm, acceptable 3.  Brachial artery: 3 mm, atherosclerotic disease evident 4.  Venous outflow: palpable thrill  5.  Radial flow: dopplerable radial signal  SPECIMEN(S):  none  INDICATIONS:   Melissa Adams is a 73 y.o. female who presents for exchange of her right IJ temporary dialysis catheter for tunneled catheter as well as placement of permanent AV fistula access..  Risk and benefits were discussed with the patient and informed consent was obtained.   DESCRIPTION: After full informed written consent was obtained from the patient, the patient was brought back to the operating room and placed supine upon the operating table.  Ultimately a bump was placed under the shoulder and the right neck as well as the existing temporary catheter was prepped in the field with sterile dressings.  Ultimately a J-wire was passed down one of the ports under fluoroscopic guidance into the right atrium.  The temporary catheter was then removed and passed off the field.  I then measured a 23 cm palindrome catheter on the chest wall.  A counterincision was then made on the chest wall and I tunneled from the counterincision to the IJ stick site with a tunneler.  A 23 cm palindrome catheter was then cut at mark #1 and tunneled through the chest wall burying the cuff under the skin and this was then cut at #2 and  clamped.  I then sequentially dilated over the wire placed a large dilator peel-away sheath into the right atrium.  The inner dilator and wire were removed.  I then placed the tip of the 23 cm palindrome down to the right atrium.  The catheter was a bit kinked on the chest all so I had to pull it back a little bit bringing the tip of the catheter back to the cavoatrial junction.  All adapters were placed.  The catheter then aspirated and flushed easily.  It was then secured to the chest wall with several 3-0 nylons.  The IJ stick site was closed with 4-0 Monocryl subcuticular stitch.  Dermabond was applied to all sites.  The patient was then prepped and draped in the standard fashion for a left arm access procedure.  I turned my attention first to identifying the patient's cephalic vein and brachial artery.  Using SonoSite guidance, the location of these vessels were marked out on the skin.  The bifurcation of brachial artery was at the level of the antecubitum.  I made a transverse incision at the level of the antecubitum and dissected through the subcutaneous tissue and fascia to gain exposure of the brachial artery.  This was noted to be 3 mm in diameter externally.  This was dissected out proximally and distally and controlled with vessel loops .  I then dissected out the cephalic vein.  This was noted to be 4-5 mm in diameter externally.  The distal segment of the vein was ligated with a  2-0 silk, and the  vein was transected.  The proximal segment was interrogated with serial dilators.  The vein accepted up to a 4 mm dilator without any difficulty.  I then instilled the heparinized saline into the vein and clamped it.  At this point, I reset my exposure of the brachial artery and placed the artery under tension proximally and distally.  I made an arteriotomy with a #11 blade, and then I extended the arteriotomy with a Potts scissor.  I injected heparinized saline proximal and distal to this arteriotomy.  The  vein was then sewn to the artery in an end-to-side configuration with a running stitch of 6-0 Prolene.  Prior to completing this anastomosis, I allowed the vein and artery to backbleed.  There was no evidence of clot from any vessels.  I completed the anastomosis in the usual fashion and then released all vessel loops and clamps.    There was a palpable thrill in the venous outflow, and there was a dopplerable radial signal.  At this point, I irrigated out the surgical wound.  There was no further active bleeding.  The subcutaneous tissue was reapproximated with a running stitch of 3-0 Vicryl.  The skin was then reapproximated with a running subcuticular stitch of 4-0 Monocryl.  The skin was then cleaned, dried, and reinforced with Dermabond.  The patient tolerated this procedure well.   COMPLICATIONS: None  CONDITION: Stable   [name], MD [facility] [place] Office: [phone] Pager: [phone]  11/04/2019, 9:32 AM

## 2019-11-04 NOTE — Progress Notes (Signed)
ANTICOAGULATION CONSULT NOTE - Follow Up Consult  Pharmacy Consult for Heparin + Cefepime Indication: atrial fibrillation + PNA  No Known Allergies  Patient Measurements: Height: 5\' 3"  (160 cm) Weight: (pt on stretcher) IBW/kg (Calculated) : 52.4  Vital Signs: Temp: 97.8 F (36.6 C) (12/10 1430) Temp Source: Oral (12/10 1430) BP: 102/58 (12/10 1800) Pulse Rate: 85 (12/10 1800)  Labs: Recent Labs    11/02/19 0305 11/02/19 0943 11/03/19 0355 11/03/19 0555 11/04/19 0500  HGB 9.1*  --  8.4*  --   --   HCT 30.3*  --  28.2*  --   --   PLT 171  --  157  --   --   HEPARINUNFRC 0.37  --  <0.10* 0.17* 0.13*  CREATININE  --  5.58*  --   --   --     Estimated Creatinine Clearance: 10.9 mL/min (A) (by C-G formula based on SCr of 5.58 mg/dL (H)).   Assessment:  Anticoag: Heparin for AF - Apixaban PTA (LD 11/8), Afib w/ valvular dz apixaban held during admit for procedures. Continues on heparin pending perm HD access. - V/Q neg for PE, LE dopplers neg Heparin stopped this am for HD access and fistula placed - spoke to Dr Carlis Abbott - No AC tonight - he will re-evaluate fistula in am and restart heparin drip no bolus if site looks ok  F/U  resume PTA apixaban 5mg  BID - when HD access sites ok Cr > 1,5, Age < 54, Wt > 60kg   ID: COVID + s/p Remdesivir. Afeb, WBC wnl - MD notes: Needs to remain in isolation for 21 days, from positive test, until Dec 8th - now removed.  - Chest x ray 12-05; new hazy opacity in the right lung base. Add Cefepime x5 day - last dose post HD 12/10  Cefepime 12/6 >>(12/10) Remdesivir 11/17 >>11/21 Dexamethasone 11/17 >> 11/19 Vitamin C, Zinc Sulfate  12/4: BC x 2:>>ngtd 11/17: COVID +  Goal of Therapy:  Heparin level 0.3-0.7 units/ml Monitor platelets by anticoagulation protocol: Yes    Plan:  Cefepime 2gm IV TTS last dose in computer for 12/10 post HD Hold heparin as above  Per VVS Restart heparin drip NO bolus 12/11 if ok with VVS F/u re-start  PTA apixaban 5mg  BID  after HD access site stable  Bonnita Nasuti Pharm.D. CPP, BCPS Clinical Pharmacist [phone] 11/04/2019 6:39 PM

## 2019-11-04 NOTE — Progress Notes (Signed)
[facility] [place]  Subjective  - no complaints.   Objective (!) 102/47 69 98 F (36.7 C) (Oral) 18 96%  Intake/Output Summary (Last 24 hours) at 11/04/2019 0718 Last data filed at 11/04/2019 0700 Gross per 24 hour  Intake 1249.49 ml  Output 501 ml  Net 748.49 ml    Left radial palpable RIJ temp dialysis catheter  Laboratory Lab Results: Recent Labs    11/02/19 0305 11/03/19 0355  WBC 4.8 4.6  HGB 9.1* 8.4*  HCT 30.3* 28.2*  PLT 171 157   BMET Recent Labs    11/02/19 0943  NA 134*  K 4.3  CL 96*  CO2 24  GLUCOSE 140*  BUN 32*  CREATININE 5.58*  CALCIUM 9.5    COAG Lab Results  Component Value Date   INR 1.2 10/26/2019   INR 1.8 (H) 10/12/2019   No results found for: PTT  Assessment/Planning:  Plan for exchange of temporary dialysis catheter for [facility] and left arm AVF vs graft.  Risks and benefits discussed with patient.  [name] 11/04/2019 7:18 AM --

## 2019-11-04 NOTE — Transfer of Care (Signed)
Immediate Anesthesia Transfer of Care Note  Patient: Melissa Adams  Procedure(s) Performed: CONVERT TEMPORARY DIALYSIS CATHETER TO TUNNELED DIALYSIS CATHETER Right Internal Jugular. (Right Chest) Insertion Of Arteriovenous (Av) Gore-Tex Graft Arm (Left Arm Lower)  Patient Location: PACU  Anesthesia Type:General  Level of Consciousness: awake, alert  and oriented  Airway & Oxygen Therapy: Patient Spontanous Breathing and Patient connected to nasal cannula oxygen  Post-op Assessment: Report given to RN and Post -op Vital signs reviewed and stable  Post vital signs: Reviewed and stable  Last Vitals:  Vitals Value Taken Time  BP 106/67 11/04/19 0946  Temp    Pulse 99 11/04/19 0949  Resp 18 11/04/19 0949  SpO2 96 % 11/04/19 0949  Vitals shown include unvalidated device data.  Last Pain:  Vitals:   11/04/19 0430  TempSrc: Oral  PainSc:       Patients Stated Pain Goal: 0 (26/83/41 9622)  Complications: No apparent anesthesia complications

## 2019-11-05 LAB — SARS CORONAVIRUS 2 (TAT 6-24 HRS): SARS Coronavirus 2: NEGATIVE

## 2019-11-05 MED ORDER — CHLORHEXIDINE GLUCONATE CLOTH 2 % EX PADS: 6.0000 | MEDICATED_PAD | Freq: Every day | CUTANEOUS | 0 refills | Status: DC

## 2019-11-05 MED ORDER — TRAMADOL HCL 50 MG PO TABS: 50.0000 mg | ORAL_TABLET | Freq: Four times a day (QID) | ORAL | 0 refills | Status: AC | PRN

## 2019-11-05 MED ORDER — HEPARIN (PORCINE) 25000 UT/250ML-% IV SOLN: 1200.0000 [IU]/h | INTRAVENOUS | Status: DC

## 2019-11-05 MED ORDER — ACETAMINOPHEN 325 MG PO TABS: 650.0000 mg | ORAL_TABLET | ORAL | Status: DC | PRN

## 2019-11-05 MED ORDER — METOPROLOL SUCCINATE ER 25 MG PO TB24: 25.0000 mg | ORAL_TABLET | Freq: Every day | ORAL | 0 refills | Status: DC

## 2019-11-05 MED ORDER — ASCORBIC ACID 500 MG PO TABS: 500.0000 mg | ORAL_TABLET | Freq: Every day | ORAL | 0 refills | Status: DC

## 2019-11-05 MED ORDER — RENA-VITE PO TABS: 1.0000 | ORAL_TABLET | Freq: Every day | ORAL | 0 refills | Status: DC

## 2019-11-05 NOTE — Progress Notes (Signed)
Spoke with HD department about patient newly placed HD cath and the dressing needing changed next day.  Patient returned from HD at 1900.  HD stated only gauze dressing would be used until tomorrow due to liklihood of bleeding same day of surgery.  First dressing change to be completed by either IV team or HD nurse.  Pt also has both upper extremities restricted, and no "pig tail" on HD cath.  At this time, HD unsure if pt is on the list for HD tomorrow.  Will call again in the morning.

## 2019-11-05 NOTE — Anesthesia Postprocedure Evaluation (Signed)
Anesthesia Post Note  Patient: Melissa Adams  Procedure(s) Performed: CONVERT TEMPORARY DIALYSIS CATHETER TO TUNNELED DIALYSIS CATHETER Right Internal Jugular. (Right Chest) Insertion Of Arteriovenous (Av) Gore-Tex Graft Arm (Left Arm Lower)     Patient location during evaluation: PACU Anesthesia Type: General Level of consciousness: awake and alert Pain management: pain level controlled Vital Signs Assessment: post-procedure vital signs reviewed and stable Respiratory status: spontaneous breathing, nonlabored ventilation, respiratory function stable and patient connected to nasal cannula oxygen Cardiovascular status: blood pressure returned to baseline and stable Postop Assessment: no apparent nausea or vomiting Anesthetic complications: no    Last Vitals:  Vitals:   11/04/19 1921 11/05/19 0349  BP: (!) 114/54 (!) 93/40  Pulse: 92 93  Resp: 18 16  Temp:  36.9 C  SpO2: 100% 96%    Last Pain:  Vitals:   11/05/19 0534  TempSrc:   PainSc: 0-No pain                 Tameem Pullara S

## 2019-11-05 NOTE — Progress Notes (Signed)
Unable to reach HD to see if patient was on list for HD today.

## 2019-11-05 NOTE — Discharge Summary (Addendum)
Physician Discharge Summary  Kerri Kovacik KZS:[id number] DOB: 1946/05/27 DOA: 10/11/2019  PCP: System, Provider Not In  Admit date: 10/11/2019 Discharge date: 11/05/2019  Time spent: >35 minutes  Recommendations for Outpatient Follow-up:  HD as scheduled. Starting Monday  PCP in 3-7 days  Discharge Diagnoses:  Active Problems:   Acute CHF (congestive heart failure) (HCC)   Atrial fibrillation, chronic (HCC)   Essential hypertension   Sleep apnea   AKI (acute kidney injury) (HCC)   Gout   Normocytic anemia   CHF exacerbation (HCC)   Dyspnea   COVID-19 virus infection   Pressure injury of skin   Acute on chronic systolic heart failure [facility])   Discharge Condition: stable   Diet recommendation: renal   Filed Weights   11/04/19 1430 11/04/19 1844 11/05/19 0336  Weight: 113.4 kg 110.9 kg 110.7 kg    History of present illness:   Melissa Adams a 73 y.o.female with a history of of heart failure, hypertension, OSA, severeMR/TR, CKD 3, obesity. She presented secondary to dyspnea and found to have acute heart failure. Patient also with COVID-19, treated with Remdesivir/Decadron. 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  Vascular surgery placed temporary dialysis catheter  Hospital Course:   Acute on chronic combined systolic and diastolic heart failure:EF of 20-25%. Initially managed on milrinone, Lasix by heart failure team.  -now on dialysis. cont HDfor vomume management. HD on MWF  CKD V. Now ESRD: Anuric. Temporary dialysis catheter placed. -transitioned to ESRD per nephrology  Hyponatremia:Low of 119.Improved to 134 on 11/02/2019.Treated with Tolvaptanand CRRT.  Chronic atrial fibrillation:Patient initially managed on amiodarone with attempt for DCCV x3, unsuccessful.  -cont metoprolol. Rate controlled. On Eliquis as an outpatient.   COVID-19 infection:Patient  treated with Remdesivir and Dexamethasone. Symptoms resolved. Now off of isolation.Tested negative on 11/03/2019.  Right LL pneumonia:Cefepime x5 days. Completed 11/04/2019.  Anemia of chronic disease:In setting of kidney disease. As per nephrology.  Essential hypertension:Normotensive/slightly soft. On metoprolol  Severe MR/TR:Noted. Outpatient cardiology follow-up.  Obstructive sleep apnea:Continue CPAPas at home.  Stage II pressure injury:Right medial labia, not present on admission.    Procedures:  HD  Av access  (i.e. Studies not automatically included, echos, thoracentesis, etc; not x-rays)  Consultations:  Nephology  Vascular surgery     Discharge Exam: Vitals:   11/05/19 0349 11/05/19 1147  BP: (!) 93/40 124/89  Pulse: 93 79  Resp: 16 20  Temp: 98.4 F (36.9 C) (!) 97.4 F (36.3 C)  SpO2: 96% 98%    General: no distress  Cardiovascular: s1,s2 rrr Respiratory: CTA BL  Discharge Instructions  Discharge Instructions    Diet - low sodium heart healthy   Complete by: As directed    Discharge instructions   Complete by: As directed    Please follow up with dialysis as scheduled.  Please follow up with primary care doctor in 3-7 days   Increase activity slowly   Complete by: As directed      Allergies as of 11/05/2019   No Known Allergies     Medication List    STOP taking these medications   furosemide 80 MG tablet Commonly known as: LASIX     TAKE these medications   acetaminophen 325 MG tablet Commonly known as: TYLENOL Take 2 tablets (650 mg total) by mouth every 4 (four) hours as needed for headache or mild pain.   allopurinol 300 MG tablet Commonly known as:  ZYLOPRIM Take 300 mg by mouth daily.   ascorbic acid 500 MG tablet Commonly known as: VITAMIN C Take 1 tablet (500 mg total) by mouth daily. Start taking on: November 06, 2019   atorvastatin 40 MG tablet Commonly known as: LIPITOR Take 40 mg by mouth  daily.   Chlorhexidine Gluconate Cloth 2 % Pads Apply 6 each topically daily. Start taking on: November 06, 2019   Chlorhexidine Gluconate Cloth 2 % Pads Apply 6 each topically daily at 6 (six) AM. Start taking on: November 06, 2019   colchicine 0.6 MG tablet Take 0.6 mg by mouth as needed (for Gout).   Eliquis 5 MG Tabs tablet Generic drug: apixaban Take 5 mg by mouth 2 (two) times daily.   metoprolol succinate 25 MG 24 hr tablet Commonly known as: TOPROL-XL Take 1 tablet (25 mg total) by mouth daily. What changed:   medication strength  how much to take  additional instructions   montelukast 10 MG tablet Commonly known as: SINGULAIR Take 10 mg by mouth daily.   multivitamin Tabs tablet Take 1 tablet by mouth at bedtime.   traMADol 50 MG tablet Commonly known as: ULTRAM Take 1 tablet (50 mg total) by mouth every 6 (six) hours as needed for up to 5 days for moderate pain. What changed:   how much to take  reasons to take this      No Known Allergies Follow-up Information    [name], MD Follow up in 6 week(s).   Specialty: Vascular Surgery Contact information: [address] [place] [address] [phone]            The results of significant diagnostics from this hospitalization (including imaging, microbiology, ancillary and laboratory) are listed below for reference.    Significant Diagnostic Studies: DG Chest 2 View  Result Date: 10/11/2019 CLINICAL DATA:  Shortness of breath EXAM: CHEST - 2 VIEW COMPARISON:  None FINDINGS: There is a small right pleural effusion. There is mild bibasilar atelectasis. There is no edema or consolidation. There is cardiomegaly with pulmonary vascularity within normal limits. No adenopathy. There is mild anterior wedging of an upper thoracic vertebral body. IMPRESSION: Small right pleural effusion with bibasilar atelectasis. No edema or consolidation. There is cardiomegaly.  No evident adenopathy.  Electronically Signed   By: Lowella Grip III M.D.   On: 10/11/2019 12:59   NM Pulmonary Perfusion  Result Date: 10/12/2019 CLINICAL DATA:  Short of breath.  Heart failure.  COVID positive. EXAM: NUCLEAR MEDICINE PERFUSION LUNG SCAN TECHNIQUE: Perfusion images were obtained in multiple projections after intravenous injection of radiopharmaceutical. RADIOPHARMACEUTICALS:  1.5 mCi Tc-43m MAA COMPARISON:  Chest radiograph 10/11/2019 FINDINGS: No wedge-shaped peripheral perfusion defects within LEFT or RIGHT lung to suggest acute pulmonary embolism. Cardiomegaly. IMPRESSION: No evidence of acute pulmonary embolism. Electronically Signed   By: Suzy Bouchard M.D.   On: 10/12/2019 16:43   US RENAL  Result Date: 10/12/2019 CLINICAL DATA:  Acute kidney injury EXAM: RENAL / URINARY TRACT ULTRASOUND COMPLETE COMPARISON:  None. FINDINGS: Right Kidney: Renal measurements: 10 x 4.9 x 5.3 cm = volume: 134.4 mL . Echogenicity within normal limits. No mass or hydronephrosis visualized. Left Kidney: Renal measurements: 9.6 x 5.2 by 3.5 cm = volume: 91.7 mL. Echogenicity within normal limits. No mass or hydronephrosis visualized. Trace left perinephric fluid. Bladder: Appears normal for degree of bladder distention. Other: Incidental note made of right pleural effusion IMPRESSION: 1. Negative renal ultrasound.  No hydronephrosis 2. Incidental note made of  right pleural effusion Electronically Signed   By: Donavan Foil M.D.   On: 10/12/2019 15:48   DG CHEST PORT 1 VIEW  Result Date: 11/04/2019 CLINICAL DATA:  Tunneled dialysis catheter placement EXAM: PORTABLE CHEST 1 VIEW COMPARISON:  10/30/2019 FINDINGS: Interval placement of a right neck large bore multi lumen vascular catheter, tip projecting near the superior cavoatrial junction. No acute abnormality of the lungs in AP portable projection. Cardiomegaly. IMPRESSION: 1. Interval placement of a right neck large bore multi lumen vascular catheter, tip projecting  near the superior cavoatrial junction. 2. Cardiomegaly without acute abnormality of the lungs in AP portable projection. Electronically Signed   By: Eddie Candle M.D.   On: 11/04/2019 10:42   DG CHEST PORT 1 VIEW  Result Date: 10/30/2019 CLINICAL DATA:  Hemoptysis.  COVID positive. EXAM: PORTABLE CHEST 1 VIEW COMPARISON:  10/29/2019 FINDINGS: Right IJ catheter tip projects over the SVC. Aortic atherosclerosis and cardiac enlargement. Lungs appear hyperinflated. Hazy lung opacity is identified at the right base, new from previous exam. IMPRESSION: 1. New hazy opacity is identified within the right lung base which may represent pneumonia. Electronically Signed   By: Kerby Moors M.D.   On: 10/30/2019 12:15   DG CHEST PORT 1 VIEW  Result Date: 10/29/2019 CLINICAL DATA:  Shortness of breath, COVID positive. EXAM: PORTABLE CHEST 1 VIEW COMPARISON:  10/25/2019. FINDINGS: Trachea is midline. Right IJ dialysis catheter tip is in the proximal SVC. Left PICC tip is at the SVC RA junction. Heart is enlarged, stable. Lungs are hyperinflated with mild basilar interstitial prominence and indistinctness, unchanged. No airspace consolidation or peripheral airspace opacities. No pleural fluid. IMPRESSION: 1. Very mild basilar interstitial prominence and indistinctness is of uncertain acuity. Edema or scarring can have this appearance. 2. No definitive evidence of COVID pneumonia. Electronically Signed   By: Lorin Picket M.D.   On: 10/29/2019 08:32   DG CHEST PORT 1 VIEW  Result Date: 10/25/2019 CLINICAL DATA:  COVID-19 pneumonia.  Pleural effusion. EXAM: PORTABLE CHEST 1 VIEW COMPARISON:  10/18/2019 chest radiograph. FINDINGS: Right internal jugular central venous catheter terminates in the middle third of the SVC. Left PICC terminates in the lower third of the SVC. Stable cardiomediastinal silhouette with mild cardiomegaly. No pneumothorax. No pleural effusion. No overt pulmonary edema. No acute consolidative  airspace disease. IMPRESSION: 1. No pleural effusions. 2. Stable cardiomegaly without overt pulmonary edema. No active pulmonary disease. Electronically Signed   By: [name] M.D.   On: 10/25/2019 10:12   DG Chest Port 1 View  Result Date: 10/18/2019 CLINICAL DATA:  Status post central line placement today. EXAM: PORTABLE CHEST 1 VIEW COMPARISON:  Single-view of the chest 10/14/2019. FINDINGS: New right IJ catheter is in place with the tip projecting in the mid superior vena cava. Left PICC is unchanged. No pneumothorax. There are right greater than left pleural effusions and airspace disease which are new since the prior examination. Marked cardiomegaly is again seen. Atherosclerosis is noted. IMPRESSION: Right IJ catheter tip projects in the mid superior vena cava. Negative for pneumothorax. Right greater than left pleural effusions with associated airspace disease which could be due to atelectasis pneumonia. Cardiomegaly. Atherosclerosis. Electronically Signed   By: Inge Rise M.D.   On: 10/18/2019 12:13   DG CHEST PORT 1 VIEW  Result Date: 10/14/2019 CLINICAL DATA:  PICC placement, COVID-19 EXAM: PORTABLE CHEST 1 VIEW COMPARISON:  10/11/2019 FINDINGS: Interval placement of left upper extremity PICC, tip positioned near the superior cavoatrial junction. Redemonstrated  cardiomegaly. Probable hiatal hernia. No acute abnormality of the lungs. IMPRESSION: Interval placement of left upper extremity PICC, tip positioned near the superior cavoatrial junction. Electronically Signed   By: Eddie Candle M.D.   On: 10/14/2019 13:12   DG CHEST PORT 1 VIEW  Result Date: 10/12/2019 CLINICAL DATA:  Status post VQ scan, right chest discomfort, COVID positive EXAM: PORTABLE CHEST 1 VIEW COMPARISON:  10/11/2019 FINDINGS: Cardiomegaly. Possible small layering pleural effusions. The visualized skeletal structures are unremarkable. IMPRESSION: No significant change in AP portable examination. Cardiomegaly with  possible small layering pleural effusions. No acute appearing airspace opacity. Electronically Signed   By: Eddie Candle M.D.   On: 10/12/2019 17:27   DG Fluoro Guide CV Line-No Report  Result Date: 11/04/2019 Fluoroscopy was utilized by the requesting physician.  No radiographic interpretation.   VAS [place] LOWER EXTREMITY VENOUS (DVT)  Result Date: 10/13/2019  Lower Venous Study Indications: Swelling.  Risk Factors: COVID 19 positive. Anticoagulation: Heparin. Limitations: Body habitus, poor ultrasound/tissue interface and patient positioning, patient pain tolerance. Comparison Study: No prior studies. Performing Technologist: Oliver Hum RVT  Examination Guidelines: A complete evaluation includes B-mode imaging, spectral Doppler, color Doppler, and power Doppler as needed of all accessible portions of each vessel. Bilateral testing is considered an integral part of a complete examination. Limited examinations for reoccurring indications may be performed as noted.  +---------+---------------+---------+-----------+----------+--------------+ RIGHT    CompressibilityPhasicitySpontaneityPropertiesThrombus Aging +---------+---------------+---------+-----------+----------+--------------+ CFV      Full           Yes      Yes                                 +---------+---------------+---------+-----------+----------+--------------+ SFJ      Full                                                        +---------+---------------+---------+-----------+----------+--------------+ FV Prox  Full                                                        +---------+---------------+---------+-----------+----------+--------------+ FV Mid                  Yes      Yes                                 +---------+---------------+---------+-----------+----------+--------------+ FV Distal               Yes      Yes                                  +---------+---------------+---------+-----------+----------+--------------+ PFV      Full                                                        +---------+---------------+---------+-----------+----------+--------------+  POP      Full           Yes      Yes                                 +---------+---------------+---------+-----------+----------+--------------+ PTV      Full                                                        +---------+---------------+---------+-----------+----------+--------------+ PERO     Full                                                        +---------+---------------+---------+-----------+----------+--------------+   +---------+---------------+---------+-----------+----------+--------------+ LEFT     CompressibilityPhasicitySpontaneityPropertiesThrombus Aging +---------+---------------+---------+-----------+----------+--------------+ CFV      Full           Yes      Yes                                 +---------+---------------+---------+-----------+----------+--------------+ SFJ      Full                                                        +---------+---------------+---------+-----------+----------+--------------+ FV Prox  Full                                                        +---------+---------------+---------+-----------+----------+--------------+ FV Mid                                                Not visualized +---------+---------------+---------+-----------+----------+--------------+ FV Distal                                             Not visualized +---------+---------------+---------+-----------+----------+--------------+ PFV      Full                                                        +---------+---------------+---------+-----------+----------+--------------+ POP      Full           Yes      Yes                                  +---------+---------------+---------+-----------+----------+--------------+ PTV  Full                                                        +---------+---------------+---------+-----------+----------+--------------+ PERO     Full                                                        +---------+---------------+---------+-----------+----------+--------------+     Summary: Right: There is no evidence of deep vein thrombosis in the lower extremity. However, portions of this examination were limited- see technologist comments above. No cystic structure found in the popliteal fossa. Left: There is no evidence of deep vein thrombosis in the lower extremity. However, portions of this examination were limited- see technologist comments above. No cystic structure found in the popliteal fossa. The common femoral vein obstruction does not  appear to extend above inguinal ligament.  *See table(s) above for measurements and observations. Electronically signed by Harold Barban MD on 10/13/2019 at 1:52:15 PM.    Final    VAS [place] UPPER EXT VEIN MAPPING (PRE-OP AVF)  Result Date: 11/03/2019 UPPER EXTREMITY VEIN MAPPING  Indications: End stage renal disease. Limitations: left upper arm bandages and tape from line that is in place Comparison Study: No priors. Performing Technologist: Oda Cogan RDMS, RVT  Examination Guidelines: A complete evaluation includes B-mode imaging, spectral Doppler, color Doppler, and power Doppler as needed of all accessible portions of each vessel. Bilateral testing is considered an integral part of a complete examination. Limited examinations for reoccurring indications may be performed as noted. +-----------------+-------------+----------+-----------------------------------+ Right Cephalic   Diameter (cm)Depth (cm)             Findings               +-----------------+-------------+----------+-----------------------------------+ Shoulder             0.30        1.10                                        +-----------------+-------------+----------+-----------------------------------+ Prox upper arm       0.33        1.00                                       +-----------------+-------------+----------+-----------------------------------+ Mid upper arm        0.29        1.00                branching              +-----------------+-------------+----------+-----------------------------------+ Dist upper arm       2.90        1.20    tapered down to 0.14 size in the  distal segment also         +-----------------+-------------+----------+-----------------------------------+ Antecubital fossa    0.18        0.49                                       +-----------------+-------------+----------+-----------------------------------+ Prox forearm         0.22        0.77                                       +-----------------+-------------+----------+-----------------------------------+ Mid forearm          0.26        0.33                                       +-----------------+-------------+----------+-----------------------------------+ Dist forearm         0.21        0.21                                       +-----------------+-------------+----------+-----------------------------------+ Wrist                0.13                                                   +-----------------+-------------+----------+-----------------------------------+ +-----------------+-------------+----------+--------------+ Right Basilic    Diameter (cm)Depth (cm)   Findings    +-----------------+-------------+----------+--------------+ Shoulder                                not visualized +-----------------+-------------+----------+--------------+ Prox upper arm       0.58                              +-----------------+-------------+----------+--------------+ Mid upper arm        0.50                  branching    +-----------------+-------------+----------+--------------+ Dist upper arm       0.35                              +-----------------+-------------+----------+--------------+ Antecubital fossa    0.23                              +-----------------+-------------+----------+--------------+ Prox forearm         0.20                              +-----------------+-------------+----------+--------------+ Mid forearm                             not visualized +-----------------+-------------+----------+--------------+ Distal forearm  not visualized +-----------------+-------------+----------+--------------+ Elbow                                   not visualized +-----------------+-------------+----------+--------------+ +-----------------+-------------+----------+---------+ Left Cephalic    Diameter (cm)Depth (cm)Findings  +-----------------+-------------+----------+---------+ Shoulder             0.36        1.10             +-----------------+-------------+----------+---------+ Prox upper arm       0.31        0.68             +-----------------+-------------+----------+---------+ Mid upper arm        0.24        1.00             +-----------------+-------------+----------+---------+ Dist upper arm       0.23        1.00             +-----------------+-------------+----------+---------+ Antecubital fossa    0.47        0.57   branching +-----------------+-------------+----------+---------+ Prox forearm         0.15        0.84             +-----------------+-------------+----------+---------+ Mid forearm          0.24        0.48             +-----------------+-------------+----------+---------+ Dist forearm         0.20        0.38             +-----------------+-------------+----------+---------+ Wrist                0.16                          +-----------------+-------------+----------+---------+ +-----------------+-------------+---------+-----------------------------------+ Left Basilic     Diameter (cm)  Depth               Findings                                               (cm)                                       +-----------------+-------------+---------+-----------------------------------+ Prox upper arm       0.79                                                  +-----------------+-------------+---------+-----------------------------------+ Mid upper arm        0.45                                                  +-----------------+-------------+---------+-----------------------------------+ Dist upper arm  tape and bandages and not                                                         visualized              +-----------------+-------------+---------+-----------------------------------+ Antecubital fossa    0.20                                                  +-----------------+-------------+---------+-----------------------------------+ Prox forearm         0.23                                                  +-----------------+-------------+---------+-----------------------------------+ Mid forearm                                      not visualized            +-----------------+-------------+---------+-----------------------------------+ Distal forearm                                   not visualized            +-----------------+-------------+---------+-----------------------------------+ Elbow                                            not visualized            +-----------------+-------------+---------+-----------------------------------+ *See table(s) above for measurements and observations.  Diagnosing physician: Curt Jews MD Electronically signed by Curt Jews MD on 11/03/2019 at 8:45:25 PM.    Final    ECHOCARDIOGRAM LIMITED  Result Date:  10/12/2019   ECHOCARDIOGRAM REPORT   Patient Name:   BRAELYN JENSON Date of Exam: 10/12/2019 Medical Rec #:  [id number]       Height:       63.0 in Accession #:    [id number]      Weight:       248.2 lb Date of Birth:  1945/12/20      BSA:          2.12 m Patient Age:    26 years        BP:           129/73 mmHg Patient Gender: F               HR:           120 bpm. Exam Location:  Inpatient Procedure: Limited Echo Indications:    CHF-Acute Systolic 629.47 / M54.65  History:        Patient has no prior history of Echocardiogram examinations.                 Arrythmias:Atrial Fibrillation; Risk Factors:Hypertension and                 Sleep Apnea. AKI (  acute kidney injury).  Sonographer:    Vikki Ports Turrentine Referring Phys: [id number] [place]  1. Left ventricular ejection fraction, by visual estimation, is 20 to 25%. The left ventricle has severely decreased function. There is no left ventricular hypertrophy.  2. Left ventricular diastolic function could not be evaluated.  3. Mildly dilated left ventricular internal cavity size.  4. Global right ventricle has severely reduced systolic function.The right ventricular size is mildly enlarged.  5. Left atrial size was moderately dilated.  6. Right atrial size was severely dilated.  7. The mitral valve is abnormal. Mild to moderate mitral valve regurgitation. No evidence of mitral stenosis.  8. The tricuspid valve is normal in structure. Tricuspid valve regurgitation moderate.  9. The aortic valve is tricuspid. Aortic valve regurgitation is not visualized. Mild aortic valve sclerosis without stenosis. 10. The pulmonic valve was normal in structure. Pulmonic valve regurgitation is mild. 11. Moderately elevated pulmonary artery systolic pressure. 12. The inferior vena cava is dilated in size with <50% respiratory variability, suggesting right atrial pressure of 15 mmHg. 13. Severe global reduction in LV systolic function; 4 chamber enlargement; mild to  moderate MR; severely reduced RV function; moderate TR. FINDINGS  Left Ventricle: Left ventricular ejection fraction, by visual estimation, is 20 to 25%. The left ventricle has severely decreased function. The left ventricular internal cavity size was mildly dilated left ventricle. There is no left ventricular hypertrophy. The left ventricular diastology could not be evaluated due to atrial fibrillation. Left ventricular diastolic function could not be evaluated. Right Ventricle: The right ventricular size is mildly enlarged.Global RV systolic function is has severely reduced systolic function. The tricuspid regurgitant velocity is 2.75 m/s, and with an assumed right atrial pressure of 15 mmHg, the estimated right ventricular systolic pressure is moderately elevated at 45.2 mmHg. Left Atrium: Left atrial size was moderately dilated. Right Atrium: Right atrial size was severely dilated Pericardium: There is no evidence of pericardial effusion. Mitral Valve: The mitral valve is abnormal. There is mild thickening of the mitral valve leaflet(s). No evidence of mitral valve stenosis by observation. Mild to moderate mitral valve regurgitation. Tricuspid Valve: The tricuspid valve is normal in structure. Tricuspid valve regurgitation moderate. Aortic Valve: The aortic valve is tricuspid. Aortic valve regurgitation is not visualized. Mild aortic valve sclerosis is present, with no evidence of aortic valve stenosis. Pulmonic Valve: The pulmonic valve was normal in structure. Pulmonic valve regurgitation is mild. Aorta: The aortic root is normal in size and structure. Venous: The inferior vena cava is dilated in size with less than 50% respiratory variability, suggesting right atrial pressure of 15 mmHg.  LEFT VENTRICLE PLAX 2D LVIDd:         5.60 cm LVIDs:         4.90 cm LV PW:         1.10 cm LV IVS:        1.10 cm LV SV:         41 ml LV SV Index:   17.75  LEFT ATRIUM         Index LA diam:    5.40 cm 2.55 cm/m   AORTA  Ao Root diam: 3.10 cm TRICUSPID VALVE TR Peak grad:   30.2 mmHg TR Vmax:        275.00 cm/s  Kirk Ruths MD Electronically signed by Kirk Ruths MD Signature Date/Time: 10/12/2019/1:46:03 PM    Final    [place] EKG SITE RITE  Result Date: 10/14/2019 If Site Rite  image not attached, placement could not be confirmed due to current cardiac rhythm.  [place] EKG SITE RITE  Result Date: 10/13/2019 If Site Rite image not attached, placement could not be confirmed due to current cardiac rhythm.  [place] EKG SITE RITE  Result Date: 10/13/2019 If Site Rite image not attached, placement could not be confirmed due to current cardiac rhythm.   Microbiology: Recent Results (from the past 240 hour(s))  Culture, blood (routine x 2)     Status: None   Collection Time: 10/29/19 10:06 AM   Specimen: BLOOD LEFT HAND  Result Value Ref Range Status   Specimen Description BLOOD LEFT HAND  Final   Special Requests   Final    BOTTLES DRAWN AEROBIC AND ANAEROBIC Blood Culture adequate volume   Culture   Final    NO GROWTH 5 DAYS Performed at [facility], [address], [place], [place] [address]    Report Status 11/03/2019 FINAL  Final  Culture, blood (routine x 2)     Status: None   Collection Time: 10/29/19 10:07 AM   Specimen: BLOOD LEFT ARM  Result Value Ref Range Status   Specimen Description BLOOD LEFT ARM  Final   Special Requests   Final    BOTTLES DRAWN AEROBIC AND ANAEROBIC Blood Culture adequate volume   Culture   Final    NO GROWTH 5 DAYS Performed at [facility], [place] [address], [place], [place] [address]    Report Status 11/03/2019 FINAL  Final  MRSA PCR Screening     Status: None   Collection Time: 11/01/19  2:45 PM   Specimen: Nasopharyngeal  Result Value Ref Range Status   MRSA by PCR NEGATIVE NEGATIVE Final    Comment:        The GeneXpert MRSA Assay (FDA approved for NASAL specimens only), is one component of a comprehensive MRSA colonization surveillance program.  It is not intended to diagnose MRSA infection nor to guide or monitor treatment for MRSA infections. Performed at [facility], [place] [address], [place], [place] [address]   SARS CORONAVIRUS 2 (TAT 6-24 HRS) Nasopharyngeal Nasopharyngeal Swab     Status: None   Collection Time: 11/03/19  7:49 PM   Specimen: Nasopharyngeal Swab  Result Value Ref Range Status   SARS Coronavirus 2 NEGATIVE NEGATIVE Final    Comment: (NOTE) SARS-CoV-2 target nucleic acids are NOT DETECTED. The SARS-CoV-2 RNA is generally detectable in upper and lower respiratory specimens during the acute phase of infection. Negative results do not preclude SARS-CoV-2 infection, do not rule out co-infections with other pathogens, and should not be used as the sole basis for treatment or other patient management decisions. Negative results must be combined with clinical observations, patient history, and epidemiological information. The expected result is Negative. Fact Sheet for Patients: SugarRoll.be Fact Sheet for Healthcare Providers: https://www.woods-mathews.com/ This test is not yet approved or cleared by the [place] FDA and  has been authorized for detection and/or diagnosis of SARS-CoV-2 by FDA under an Emergency Use Authorization (EUA). This EUA will remain  in effect (meaning this test can be used) for the duration of the COVID-19 declaration under Section 56 4(b)(1) of the Act, 21 U.S.C. section 360bbb-3(b)(1), unless the authorization is terminated or revoked sooner. Performed at [facility], [place] [address] [place], [place] [address]   Surgical pcr screen     Status: None   Collection Time: 11/04/19  4:43 AM   Specimen: Nasal  Mucosa; Nasal Swab  Result Value Ref Range Status   MRSA, PCR NEGATIVE NEGATIVE Final   Staphylococcus aureus NEGATIVE NEGATIVE Final    Comment: (NOTE) The Xpert SA Assay (FDA approved for NASAL specimens in patients  31 years of age and older), is one component of a comprehensive surveillance program. It is not intended to diagnose infection nor to guide or monitor treatment. Performed at [facility], [place] [address] [place], [place] [address]   SARS CORONAVIRUS 2 (TAT 6-24 HRS) Nasopharyngeal Nasopharyngeal Swab     Status: None   Collection Time: 11/04/19 10:25 PM   Specimen: Nasopharyngeal Swab  Result Value Ref Range Status   SARS Coronavirus 2 NEGATIVE NEGATIVE Final    Comment: (NOTE) SARS-CoV-2 target nucleic acids are NOT DETECTED. The SARS-CoV-2 RNA is generally detectable in upper and lower respiratory specimens during the acute phase of infection. Negative results do not preclude SARS-CoV-2 infection, do not rule out co-infections with other pathogens, and should not be used as the sole basis for treatment or other patient management decisions. Negative results must be combined with clinical observations, patient history, and epidemiological information. The expected result is Negative. Fact Sheet for Patients: SugarRoll.be Fact Sheet for Healthcare Providers: https://www.woods-mathews.com/ This test is not yet approved or cleared by the [place] FDA and  has been authorized for detection and/or diagnosis of SARS-CoV-2 by FDA under an Emergency Use Authorization (EUA). This EUA will remain  in effect (meaning this test can be used) for the duration of the COVID-19 declaration under Section 56 4(b)(1) of the Act, 21 U.S.C. section 360bbb-3(b)(1), unless the authorization is terminated or revoked sooner. Performed at [facility], [place] [address], [place], [place] [address]      Labs: Basic Metabolic Panel: Recent Labs  Lab 10/30/19 0203 10/31/19 0319 11/01/19 0400 11/02/19 0305 11/02/19 0943 11/03/19 0355 11/04/19 0500  NA 133* 134* 135  --  134*  --   --   K 4.3 4.0 4.6  --  4.3  --   --   CL 96* 98 97*  --  96*   --   --   CO2 27 26 27   --  24  --   --   GLUCOSE 87 89 87  --  140*  --   --   BUN 23 19 27*  --  32*  --   --   CREATININE 4.15* 3.86* 4.89*  --  5.58*  --   --   CALCIUM 8.8* 8.8* 9.3  --  9.5  --   --   MG 2.1 2.1 2.1 2.2  --  2.0 2.1  PHOS 2.8 2.8 2.9  --  3.3  --   --    Liver Function Tests: Recent Labs  Lab 10/30/19 0203 10/31/19 0319 11/01/19 0400 11/02/19 0943  ALBUMIN 2.9* 2.8* 3.0* 2.9*   No results for input(s): LIPASE, AMYLASE in the last 168 hours. No results for input(s): AMMONIA in the last 168 hours. CBC: Recent Labs  Lab 10/30/19 0203 10/31/19 0319 11/01/19 0400 11/02/19 0305 11/03/19 0355  WBC 5.5 5.0 5.6 4.8 4.6  HGB 8.9* 8.3* 8.8* 9.1* 8.4*  HCT 29.5* 28.0* 28.8* 30.3* 28.2*  MCV 82.2 83.6 82.5 84.2 84.9  PLT 155 153 168 171 157   Cardiac Enzymes: No results for input(s): CKTOTAL, CKMB, CKMBINDEX, TROPONINI in the last 168 hours. BNP: BNP (last 3 results) Recent Labs    10/11/19 1309  BNP 3,374.7*  ProBNP (last 3 results) No results for input(s): PROBNP in the last 8760 hours.  CBG: No results for input(s): GLUCAP in the last 168 hours.     SignedKinnie Feil  [facility] 11/05/2019, 1:49 PM  Addendum: patient was treated for probable coivd and as well as bacterial pneumonia Kynslee Baham N  Addendum: Acute hypoxic resp failure: 2/2 covid-19 pna. And bacterail pneumonia  -resolved Seda Kronberg N

## 2019-11-05 NOTE — Progress Notes (Signed)
[facility] PROGRESS NOTE  Pernell Dikes ATF:[id number] DOB: 1946/07/25 DOA: 10/11/2019 PCP: System, Provider Not In  Assessment/Plan:  Cecilie Kicks a 73 y.o.female with a history of of heart failure, hypertension, OSA, severeMR/TR, CKD 3, obesity. She presented secondary to dyspnea and found to have acute heart failure. Patient also with COVID-19, treated with Remdesivir/Decadron. 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  Vascular surgery placed temporary dialysis catheter   Acute on chronic combined systolic and diastolic heart failure:  EF of 20-25%. Initially managed on milrinone, Lasix by heart failure team.  -now, cont HD for vomume management.  CKD V. Now ESRD: Anuric. Temporary dialysis catheter placed today.  -transition to ESRD per nephrology  Hyponatremia: Low of 119. Improved to 134 on 11/02/2019. Treated with Tolvaptanand CRRT.  Chronic atrial fibrillation: Patient initially managed on amiodarone with attempt for DCCV x3, unsuccessful.  -cont metoprolol. Rate controlled. On Eliquis as an outpatient. Will restart in 24 hrs   COVID-19 infection: Patient treated with Remdesivir and Dexamethasone. Symptoms resolved. Now off of isolation. Tested negative on 11/03/2019.  Right LL pneumonia: Cefepime x5 days. Completed 11/04/2019.  Anemia of chronic disease: In setting of kidney disease. As per nephrology.  Essential hypertension: Normotensive/slightly soft. On metoprolol for below.  Severe MR/TR: Noted. Outpatient cardiology follow-up.  Obstructive sleep apnea: Continue CPAP as at home.  Stage II pressure injury: Right medial labia, not present on admission.   Code Status: full Family Communication: d/w patient, RN (indicate person spoken with, relationship, and if by phone, the number) Disposition Plan: pend HD arrangements    Consultants:  Vascular  surgery  Nephrology  Heart Failure  Cardiology   Procedures:  Placement of temporary dialysis catheter  HD  Antibiotics: Anti-infectives (From admission, onward)   Start     Dose/Rate Route Frequency Ordered Stop   11/02/19 1200  ceFEPIme (MAXIPIME) 2 g in sodium chloride 0.9 % 100 mL IVPB     2 g 200 mL/hr over 30 Minutes Intravenous Every T-Th-Sa (Hemodialysis) 10/30/19 1439 11/04/19 2359   10/30/19 1530  ceFEPIme (MAXIPIME) 2 g in sodium chloride 0.9 % 100 mL IVPB     2 g 200 mL/hr over 30 Minutes Intravenous  Once 10/30/19 1439 10/30/19 2327   10/15/19 1600  remdesivir 100 mg in sodium chloride 0.9 % 250 mL IVPB     100 mg 500 mL/hr over 30 Minutes Intravenous Every 24 hours 10/15/19 1020 10/16/19 1737   10/15/19 1200  remdesivir 100 mg in sodium chloride 0.9 % 250 mL IVPB  Status:  Discontinued     100 mg 500 mL/hr over 30 Minutes Intravenous Every 24 hours 10/15/19 1018 10/15/19 1020   10/13/19 1000  remdesivir 100 mg in sodium chloride 0.9 % 250 mL IVPB  Status:  Discontinued     100 mg 500 mL/hr over 30 Minutes Intravenous Every 24 hours 10/12/19 0600 10/14/19 1646   10/12/19 0700  remdesivir 200 mg in sodium chloride 0.9 % 250 mL IVPB     200 mg 500 mL/hr over 30 Minutes Intravenous Once 10/12/19 0600 10/12/19 0908        (indicate start date, and stop date if known)  HPI/Subjective: No distress. Reports feeling well   Objective: Vitals:   11/04/19 1921 11/05/19 0349  BP: (!) 114/54 (!) 93/40  Pulse: 92 93  Resp: 18 16  Temp:  98.4 F (36.9 C)  SpO2: 100% 96%  Intake/Output Summary (Last 24 hours) at 11/05/2019 1002 Last data filed at 11/05/2019 0849 Gross per 24 hour  Intake 938 ml  Output 2600 ml  Net -1662 ml   Filed Weights   11/04/19 1430 11/04/19 1844 11/05/19 0336  Weight: 113.4 kg 110.9 kg 110.7 kg    Exam:   General:  No distress   Cardiovascular: s1,s2 rrr  Respiratory: no wheezing   Abdomen: soft, nt    Musculoskeletal: mild leg edema    Data Reviewed: Basic Metabolic Panel: Recent Labs  Lab 10/30/19 0203 10/31/19 0319 11/01/19 0400 11/02/19 0305 11/02/19 0943 11/03/19 0355 11/04/19 0500  NA 133* 134* 135  --  134*  --   --   K 4.3 4.0 4.6  --  4.3  --   --   CL 96* 98 97*  --  96*  --   --   CO2 27 26 27   --  24  --   --   GLUCOSE 87 89 87  --  140*  --   --   BUN 23 19 27*  --  32*  --   --   CREATININE 4.15* 3.86* 4.89*  --  5.58*  --   --   CALCIUM 8.8* 8.8* 9.3  --  9.5  --   --   MG 2.1 2.1 2.1 2.2  --  2.0 2.1  PHOS 2.8 2.8 2.9  --  3.3  --   --    Liver Function Tests: Recent Labs  Lab 10/30/19 0203 10/31/19 0319 11/01/19 0400 11/02/19 0943  ALBUMIN 2.9* 2.8* 3.0* 2.9*   No results for input(s): LIPASE, AMYLASE in the last 168 hours. No results for input(s): AMMONIA in the last 168 hours. CBC: Recent Labs  Lab 10/30/19 0203 10/31/19 0319 11/01/19 0400 11/02/19 0305 11/03/19 0355  WBC 5.5 5.0 5.6 4.8 4.6  HGB 8.9* 8.3* 8.8* 9.1* 8.4*  HCT 29.5* 28.0* 28.8* 30.3* 28.2*  MCV 82.2 83.6 82.5 84.2 84.9  PLT 155 153 168 171 157   Cardiac Enzymes: No results for input(s): CKTOTAL, CKMB, CKMBINDEX, TROPONINI in the last 168 hours. BNP (last 3 results) Recent Labs    10/11/19 1309  BNP 3,374.7*    ProBNP (last 3 results) No results for input(s): PROBNP in the last 8760 hours.  CBG: No results for input(s): GLUCAP in the last 168 hours.  Recent Results (from the past 240 hour(s))  Culture, blood (routine x 2)     Status: None   Collection Time: 10/29/19 10:06 AM   Specimen: BLOOD LEFT HAND  Result Value Ref Range Status   Specimen Description BLOOD LEFT HAND  Final   Special Requests   Final    BOTTLES DRAWN AEROBIC AND ANAEROBIC Blood Culture adequate volume   Culture   Final    NO GROWTH 5 DAYS Performed at [facility], [address], [place], [place] [address]    Report Status 11/03/2019 FINAL  Final  Culture, blood (routine x  2)     Status: None   Collection Time: 10/29/19 10:07 AM   Specimen: BLOOD LEFT ARM  Result Value Ref Range Status   Specimen Description BLOOD LEFT ARM  Final   Special Requests   Final    BOTTLES DRAWN AEROBIC AND ANAEROBIC Blood Culture adequate volume   Culture   Final    NO GROWTH 5 DAYS Performed at [facility], [place] [address] [place],  [address]  Report Status 11/03/2019 FINAL  Final  MRSA PCR Screening     Status: None   Collection Time: 11/01/19  2:45 PM   Specimen: Nasopharyngeal  Result Value Ref Range Status   MRSA by PCR NEGATIVE NEGATIVE Final    Comment:        The GeneXpert MRSA Assay (FDA approved for NASAL specimens only), is one component of a comprehensive MRSA colonization surveillance program. It is not intended to diagnose MRSA infection nor to guide or monitor treatment for MRSA infections. Performed at [facility], [place] [address] [place], [place] [address]   SARS CORONAVIRUS 2 (TAT 6-24 HRS) Nasopharyngeal Nasopharyngeal Swab     Status: None   Collection Time: 11/03/19  7:49 PM   Specimen: Nasopharyngeal Swab  Result Value Ref Range Status   SARS Coronavirus 2 NEGATIVE NEGATIVE Final    Comment: (NOTE) SARS-CoV-2 target nucleic acids are NOT DETECTED. The SARS-CoV-2 RNA is generally detectable in upper and lower respiratory specimens during the acute phase of infection. Negative results do not preclude SARS-CoV-2 infection, do not rule out co-infections with other pathogens, and should not be used as the sole basis for treatment or other patient management decisions. Negative results must be combined with clinical observations, patient history, and epidemiological information. The expected result is Negative. Fact Sheet for Patients: SugarRoll.be Fact Sheet for Healthcare Providers: https://www.woods-mathews.com/ This test is not yet approved or cleared by the [place] FDA  and  has been authorized for detection and/or diagnosis of SARS-CoV-2 by FDA under an Emergency Use Authorization (EUA). This EUA will remain  in effect (meaning this test can be used) for the duration of the COVID-19 declaration under Section 56 4(b)(1) of the Act, 21 U.S.C. section 360bbb-3(b)(1), unless the authorization is terminated or revoked sooner. Performed at [facility], [place] [address] [place], [place] [address]   Surgical pcr screen     Status: None   Collection Time: 11/04/19  4:43 AM   Specimen: Nasal Mucosa; Nasal Swab  Result Value Ref Range Status   MRSA, PCR NEGATIVE NEGATIVE Final   Staphylococcus aureus NEGATIVE NEGATIVE Final    Comment: (NOTE) The Xpert SA Assay (FDA approved for NASAL specimens in patients 15 years of age and older), is one component of a comprehensive surveillance program. It is not intended to diagnose infection nor to guide or monitor treatment. Performed at [facility], [place] [address], [place], [place] [address]   SARS CORONAVIRUS 2 (TAT 6-24 HRS) Nasopharyngeal Nasopharyngeal Swab     Status: None   Collection Time: 11/04/19 10:25 PM   Specimen: Nasopharyngeal Swab  Result Value Ref Range Status   SARS Coronavirus 2 NEGATIVE NEGATIVE Final    Comment: (NOTE) SARS-CoV-2 target nucleic acids are NOT DETECTED. The SARS-CoV-2 RNA is generally detectable in upper and lower respiratory specimens during the acute phase of infection. Negative results do not preclude SARS-CoV-2 infection, do not rule out co-infections with other pathogens, and should not be used as the sole basis for treatment or other patient management decisions. Negative results must be combined with clinical observations, patient history, and epidemiological information. The expected result is Negative. Fact Sheet for Patients: SugarRoll.be Fact Sheet for Healthcare Providers: https://www.woods-mathews.com/ This  test is not yet approved or cleared by the [place] FDA and  has been authorized for detection and/or diagnosis of SARS-CoV-2 by FDA under an Emergency Use Authorization (EUA). This EUA will remain  in effect (meaning this test  can be used) for the duration of the COVID-19 declaration under Section 56 4(b)(1) of the Act, 21 U.S.C. section 360bbb-3(b)(1), unless the authorization is terminated or revoked sooner. Performed at [facility], [place] [address], [place], [place] [address]      Studies: DG CHEST PORT 1 VIEW  Result Date: 11/04/2019 CLINICAL DATA:  Tunneled dialysis catheter placement EXAM: PORTABLE CHEST 1 VIEW COMPARISON:  10/30/2019 FINDINGS: Interval placement of a right neck large bore multi lumen vascular catheter, tip projecting near the superior cavoatrial junction. No acute abnormality of the lungs in AP portable projection. Cardiomegaly. IMPRESSION: 1. Interval placement of a right neck large bore multi lumen vascular catheter, tip projecting near the superior cavoatrial junction. 2. Cardiomegaly without acute abnormality of the lungs in AP portable projection. Electronically Signed   By: Eddie Candle M.D.   On: 11/04/2019 10:42   DG Fluoro Guide CV Line-No Report  Result Date: 11/04/2019 Fluoroscopy was utilized by the requesting physician.  No radiographic interpretation.   VAS [place] UPPER EXT VEIN MAPPING (PRE-OP AVF)  Result Date: 11/03/2019 UPPER EXTREMITY VEIN MAPPING  Indications: End stage renal disease. Limitations: left upper arm bandages and tape from line that is in place Comparison Study: No priors. Performing Technologist: Oda Cogan RDMS, RVT  Examination Guidelines: A complete evaluation includes B-mode imaging, spectral Doppler, color Doppler, and power Doppler as needed of all accessible portions of each vessel. Bilateral testing is considered an integral part of a complete examination. Limited examinations for reoccurring indications may be  performed as noted. +-----------------+-------------+----------+-----------------------------------+ Right Cephalic   Diameter (cm)Depth (cm)             Findings               +-----------------+-------------+----------+-----------------------------------+ Shoulder             0.30        1.10                                       +-----------------+-------------+----------+-----------------------------------+ Prox upper arm       0.33        1.00                                       +-----------------+-------------+----------+-----------------------------------+ Mid upper arm        0.29        1.00                branching              +-----------------+-------------+----------+-----------------------------------+ Dist upper arm       2.90        1.20    tapered down to 0.14 size in the                                                   distal segment also         +-----------------+-------------+----------+-----------------------------------+ Antecubital fossa    0.18        0.49                                       +-----------------+-------------+----------+-----------------------------------+  Prox forearm         0.22        0.77                                       +-----------------+-------------+----------+-----------------------------------+ Mid forearm          0.26        0.33                                       +-----------------+-------------+----------+-----------------------------------+ Dist forearm         0.21        0.21                                       +-----------------+-------------+----------+-----------------------------------+ Wrist                0.13                                                   +-----------------+-------------+----------+-----------------------------------+ +-----------------+-------------+----------+--------------+ Right Basilic    Diameter (cm)Depth (cm)   Findings     +-----------------+-------------+----------+--------------+ Shoulder                                not visualized +-----------------+-------------+----------+--------------+ Prox upper arm       0.58                              +-----------------+-------------+----------+--------------+ Mid upper arm        0.50                 branching    +-----------------+-------------+----------+--------------+ Dist upper arm       0.35                              +-----------------+-------------+----------+--------------+ Antecubital fossa    0.23                              +-----------------+-------------+----------+--------------+ Prox forearm         0.20                              +-----------------+-------------+----------+--------------+ Mid forearm                             not visualized +-----------------+-------------+----------+--------------+ Distal forearm                          not visualized +-----------------+-------------+----------+--------------+ Elbow                                   not visualized +-----------------+-------------+----------+--------------+ +-----------------+-------------+----------+---------+ Left Cephalic    Diameter (cm)Depth (cm)Findings  +-----------------+-------------+----------+---------+ Shoulder  0.36        1.10             +-----------------+-------------+----------+---------+ Prox upper arm       0.31        0.68             +-----------------+-------------+----------+---------+ Mid upper arm        0.24        1.00             +-----------------+-------------+----------+---------+ Dist upper arm       0.23        1.00             +-----------------+-------------+----------+---------+ Antecubital fossa    0.47        0.57   branching +-----------------+-------------+----------+---------+ Prox forearm         0.15        0.84              +-----------------+-------------+----------+---------+ Mid forearm          0.24        0.48             +-----------------+-------------+----------+---------+ Dist forearm         0.20        0.38             +-----------------+-------------+----------+---------+ Wrist                0.16                         +-----------------+-------------+----------+---------+ +-----------------+-------------+---------+-----------------------------------+ Left Basilic     Diameter (cm)  Depth               Findings                                               (cm)                                       +-----------------+-------------+---------+-----------------------------------+ Prox upper arm       0.79                                                  +-----------------+-------------+---------+-----------------------------------+ Mid upper arm        0.45                                                  +-----------------+-------------+---------+-----------------------------------+ Dist upper arm                              tape and bandages and not  visualized              +-----------------+-------------+---------+-----------------------------------+ Antecubital fossa    0.20                                                  +-----------------+-------------+---------+-----------------------------------+ Prox forearm         0.23                                                  +-----------------+-------------+---------+-----------------------------------+ Mid forearm                                      not visualized            +-----------------+-------------+---------+-----------------------------------+ Distal forearm                                   not visualized            +-----------------+-------------+---------+-----------------------------------+ Elbow                                             not visualized            +-----------------+-------------+---------+-----------------------------------+ *See table(s) above for measurements and observations.  Diagnosing physician: Curt Jews MD Electronically signed by Curt Jews MD on 11/03/2019 at 8:45:25 PM.    Final     Scheduled Meds: . allopurinol  200 mg Oral Daily  . atorvastatin  40 mg Oral Daily  . Chlorhexidine Gluconate Cloth  6 each Topical Daily  . Chlorhexidine Gluconate Cloth  6 each Topical Q0600  . feeding supplement (NEPRO CARB STEADY)  237 mL Oral Q24H  . mouth rinse  15 mL Mouth Rinse BID  . metoprolol succinate  12.5 mg Oral BID  . multivitamin  1 tablet Oral QHS  . vitamin C  500 mg Oral Daily  . zinc sulfate  220 mg Oral Daily   Continuous Infusions: . sodium chloride Stopped (10/25/19 1258)    Active Problems:   Acute CHF (congestive heart failure) (HCC)   Atrial fibrillation, chronic (HCC)   Essential hypertension   Sleep apnea   AKI (acute kidney injury) (HCC)   Gout   Normocytic anemia   CHF exacerbation (HCC)   Dyspnea   COVID-19 virus infection   Pressure injury of skin   Acute on chronic systolic heart failure ([place])    Time spent: >25 minutes     Kinnie Feil  [facility] Pager [phone]. If 7PM-7AM, please contact night-coverage at [email or web address], password [facility] 11/05/2019, 10:02 AM  LOS: 24 days

## 2019-11-05 NOTE — Progress Notes (Signed)
[facility]  Subjective  - states her left hand feels fine.  Melissa Adams worked in dialysis yesterday.   Objective (!) 93/40 93 98.4 F (36.9 C) (Oral) 16 96%  Intake/Output Summary (Last 24 hours) at 11/05/2019 0858 Last data filed at 11/05/2019 0849 Gross per 24 hour  Intake 1338 ml  Output 2625 ml  Net -1287 ml    Weakly palpable left radial pulse Palpable thrill left arm RIJ TDC no hematoma  Laboratory Lab Results: Recent Labs    11/03/19 0355  WBC 4.6  HGB 8.4*  HCT 28.2*  PLT 157   BMET Recent Labs    11/02/19 0943  NA 134*  K 4.3  CL 96*  CO2 24  GLUCOSE 140*  BUN 32*  CREATININE 5.58*  CALCIUM 9.5    COAG Lab Results  Component Value Date   INR 1.2 10/26/2019   INR 1.8 (H) 10/12/2019   No results found for: PTT  Assessment/Planning: POD #1 s/p exchange of RIJ temp catheter for [facility] and left brachiocephalic AVF placement.  Left fistula with palpable thrill.  RIJ catheter worked fine yesterday.  Will arrange follow-up with vascular surgery in 6 weeks for left arm fistula duplex.  OK to restart heparin no bolus.  Call vascular with questions or concerns.    [name] 11/05/2019 8:58 AM --

## 2019-11-05 NOTE — TOC Transition Note (Addendum)
Transition of Care [facility]) - CM/SW Discharge Note   Patient Details  Name: Melissa Adams MRN: [id number] Date of Birth: 07/29/1946  Transition of Care [facility] CM/SW Contact:  Zenon Mayo, RN Phone Number: 11/05/2019, 3:03 PM   Clinical Narrative:    Patient is for dc today, she will need HHRN , HHPT, she states to call her daughter, since she will be staying with her at [address] [place] [address].  NCM offered choice to daughter, list on shadow chart., she chose [place]. Referral made to [facility] with Alvis Lemmings , soc will begin 24 to 48 hrs post dc.   [place], BSN - NCM received call from [place] with [employer] , stating they ran patient's insurance and she does not have any out of net work coverage, she has Copy for [place] only.  NCM informed her daughter [name] of this information.  Daughter states she will need a follow up apt also.  NCM got patient [facility] and informed daughter. Daughter states she will call her insurance and take off the [place] and just tell them to keep her on the regular medicare, she will work on this so that patient can use  coverage.   Final next level of care: [place] Barriers to Discharge: No Barriers Identified   Patient Goals and CMS Choice Patient states their goals for this hospitalization and ongoing recovery are:: get better CMS Medicare.gov Compare Post Acute Care list provided to:: Patient Represenative (must comment)(daughter) Choice offered to / list presented to : Adult Children  Discharge Placement                       Discharge Plan and Services                DME Arranged: (NA)         HH Arranged: RN, Disease Management, PT HH Agency: [place] Date [facility] Agency Contacted: 11/05/19 Time [place]: [phone] Representative spoke with at [place]: [facility] Interventions      Readmission Risk Interventions No flowsheet data found.

## 2019-11-05 NOTE — Progress Notes (Signed)
Paged [facility] about how to get labs from patient if patient does not go to HD today.

## 2019-11-05 NOTE — Progress Notes (Signed)
Physical Therapy Treatment Patient Details Name: Melissa Adams MRN: [id number] DOB: 06/10/1946 Today's Date: 11/05/2019    History of Present Illness 73 y.o. female with a medical history of heart failure, atrial fibrillation, hypertension, sleep apnea, who presented to the emergency department with complaints of shortness of breath and weight gain. 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 In ED found to have an elevated BNP with dyspnea on exertion. CXR showed small right pleural effusion with bibasilar atelectasis. Admitted 10/11/19 Cardiorenal syndrome-was on CRRT from 11/23-11/29,    PT Comments    Pt tolerated treatment well, performing all mobility with use of rollator and supervision. Pt is able to ambulate community distances at this time, with some fatigue related to muscular endurance, but was not limited by cardiopulmonary function. Pt reports understanding of utilization of breaks during mobility with use of rollator at this time. Stair training not initiated as pt reports she has no stairs to navigate while mobilizing, however once returning to room and seated she does report one or 2 steps into home. Pt will benefit from stair training in next session.   Follow Up Recommendations  Home health PT;Supervision - Intermittent     Equipment Recommendations  (bariatric rollator)    Recommendations for Other Services       Precautions / Restrictions Precautions Precautions: Fall Restrictions Weight Bearing Restrictions: No    Mobility  Bed Mobility Overal bed mobility: (pt received and left sitting in recliner)                Transfers Overall transfer level: Needs assistance Equipment used: 4-wheeled walker Transfers: Sit to/from Stand Sit to Stand: Supervision            Ambulation/Gait Ambulation/Gait assistance: Supervision Gait Distance (Feet): 300 Feet Assistive device: 4-wheeled walker Gait Pattern/deviations: Step-through  pattern;Decreased stride length Gait velocity: reduced Gait velocity interpretation: >2.62 ft/sec, indicative of community ambulatory General Gait Details: pt with steady step through gait, able to navigate obstacles in [place] without noticeable balance deviations   Stairs             Wheelchair Mobility    Modified Rankin (Stroke Patients Only)       Balance Overall balance assessment: Modified Independent                                          Cognition Arousal/Alertness: Awake/alert Behavior During Therapy: WFL for tasks assessed/performed Overall Cognitive Status: Within Functional Limits for tasks assessed                                        Exercises      General Comments General comments (skin integrity, edema, etc.): Pt asymptomatic during session, reports being limited by LE fatigue      Pertinent Vitals/Pain Pain Assessment: No/denies pain    Home Living                      Prior Function            PT Goals (current goals can now be found in the care plan section) Acute Rehab PT Goals Patient Stated Goal: go home Progress towards PT goals: Progressing toward goals    Frequency    Min 3X/week      PT  Plan Current plan remains appropriate    Co-evaluation              AM-PAC PT "6 Clicks" Mobility   Outcome Measure  Help needed turning from your back to your side while in a flat bed without using bedrails?: None Help needed moving from lying on your back to sitting on the side of a flat bed without using bedrails?: None Help needed moving to and from a bed to a chair (including a wheelchair)?: None Help needed standing up from a chair using your arms (e.g., wheelchair or bedside chair)?: None Help needed to walk in hospital room?: None Help needed climbing 3-5 steps with a railing? : A Little 6 Click Score: 23    End of Session Equipment Utilized During Treatment:  (none) Activity Tolerance: Patient tolerated treatment well Patient left: in chair;with call bell/phone within reach Nurse Communication: Mobility status PT Visit Diagnosis: Unsteadiness on feet (R26.81)     Time: 4076-8088 PT Time Calculation (min) (ACUTE ONLY): 10 min  Charges:  $Gait Training: 8-22 mins                     Zenaida Niece, PT, DPT Acute Rehabilitation Pager: [phone]    Zenaida Niece 11/05/2019, 10:48 AM

## 2019-11-05 NOTE — Progress Notes (Signed)
Patient not on schedule for HD today.  Consult placed for IV team.  IV team called and said unless dressing is soiled it will not be changed for 48 hours post surgery.

## 2019-11-05 NOTE — Progress Notes (Addendum)
Patient ID: Melissa Adams, female   DOB: 05/15/1946, 73 y.o.   MRN: [id number] [facility] Progress Note   Assessment/ Plan:   1.  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  Appreciate assistance from vascular surgery with converting her dialysis catheter to a tunneled line and placement of left BCF.  Process underway for outpatient dialysis unit placement pending Covid testing.  Next hemodialysis tomorrow. 2.  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  Ongoing volume management with hemodialysis.  She also has underlying valvular heart disease and atrial fibrillation. 3.  Atrial fibrillation: Rate controlled and appearing to tolerate current dose of metoprolol without significant hypotension. 4.  Anemia of critical illness: Hemoglobin/hematocrit appears stable without any overt blood loss.  We will continue to follow. 5.  COVID-19 infection: Status post remdesivir and dexamethasone-isolation discontinued on 12/8.  Respiratory status back to baseline.  Subjective:   Yesterday underwent conversion of temporary right IJ catheter to tunneled line and placement of left BCF.  Thereafter dialyzed in afternoon without problems.    Objective:   BP (!) 93/40 (BP Location: Left Leg)   Pulse 93   Temp 98.4 F (36.9 C) (Oral)   Resp 16   Ht 5\' 3"  (1.6 m)   Wt 110.7 kg   SpO2 96%   BMI 43.22 kg/m   Intake/Output Summary (Last 24 hours) at 11/05/2019 0852 Last data filed at 11/05/2019 0849 Gross per 24 hour  Intake 1338 ml  Output 2625 ml  Net -1287 ml   Weight change: 1.861 kg  Physical Exam: Gen: Comfortably sitting up in recliner CVS: Pulse regular rhythm, normal rate, S1 and S2 normal Resp: Clear to auscultation bilaterally without  distinct rales or rhonchi.  Right IJ TDC in situ. Abd: Soft, obese, nontender, bowel sounds normal Ext: 1+ bilateral pitting lower extremity edema.  Left BCF with audible bruit.  Imaging: DG CHEST PORT 1 VIEW  Result Date: 11/04/2019 CLINICAL DATA:  Tunneled dialysis catheter placement EXAM: PORTABLE CHEST 1 VIEW COMPARISON:  10/30/2019 FINDINGS: Interval placement of a right neck large bore multi lumen vascular catheter, tip projecting near the superior cavoatrial junction. No acute abnormality of the lungs in AP portable projection. Cardiomegaly. IMPRESSION: 1. Interval placement of a right neck large bore multi lumen vascular catheter, tip projecting near the superior cavoatrial junction. 2. Cardiomegaly without acute abnormality of the lungs in AP portable projection. Electronically Signed   By: Eddie Candle M.D.   On: 11/04/2019 10:42   DG Fluoro Guide CV Line-No Report  Result Date: 11/04/2019 Fluoroscopy was utilized by the requesting physician.  No radiographic interpretation.   VAS [place] UPPER EXT VEIN MAPPING (PRE-OP AVF)  Result Date: 11/03/2019 UPPER EXTREMITY VEIN MAPPING  Indications: End stage renal disease. Limitations: left upper arm bandages and tape from line that is in place Comparison Study: No priors. Performing Technologist: Oda Cogan RDMS, RVT  Examination Guidelines: A complete evaluation includes B-mode imaging, spectral Doppler, color Doppler, and power Doppler as needed of all accessible portions of each vessel. Bilateral testing is considered an integral part of a complete examination. Limited examinations for reoccurring indications may be performed as noted. +-----------------+-------------+----------+-----------------------------------+ Right Cephalic   Diameter (cm)Depth (cm)             Findings               +-----------------+-------------+----------+-----------------------------------+  Shoulder             0.30        1.10                                        +-----------------+-------------+----------+-----------------------------------+ Prox upper arm       0.33        1.00                                       +-----------------+-------------+----------+-----------------------------------+ Mid upper arm        0.29        1.00                branching              +-----------------+-------------+----------+-----------------------------------+ Dist upper arm       2.90        1.20    tapered down to 0.14 size in the                                                   distal segment also         +-----------------+-------------+----------+-----------------------------------+ Antecubital fossa    0.18        0.49                                       +-----------------+-------------+----------+-----------------------------------+ Prox forearm         0.22        0.77                                       +-----------------+-------------+----------+-----------------------------------+ Mid forearm          0.26        0.33                                       +-----------------+-------------+----------+-----------------------------------+ Dist forearm         0.21        0.21                                       +-----------------+-------------+----------+-----------------------------------+ Wrist                0.13                                                   +-----------------+-------------+----------+-----------------------------------+ +-----------------+-------------+----------+--------------+ Right Basilic    Diameter (cm)Depth (cm)   Findings    +-----------------+-------------+----------+--------------+ Shoulder  not visualized +-----------------+-------------+----------+--------------+ Prox upper arm       0.58                              +-----------------+-------------+----------+--------------+ Mid upper arm        0.50                 branching     +-----------------+-------------+----------+--------------+ Dist upper arm       0.35                              +-----------------+-------------+----------+--------------+ Antecubital fossa    0.23                              +-----------------+-------------+----------+--------------+ Prox forearm         0.20                              +-----------------+-------------+----------+--------------+ Mid forearm                             not visualized +-----------------+-------------+----------+--------------+ Distal forearm                          not visualized +-----------------+-------------+----------+--------------+ Elbow                                   not visualized +-----------------+-------------+----------+--------------+ +-----------------+-------------+----------+---------+ Left Cephalic    Diameter (cm)Depth (cm)Findings  +-----------------+-------------+----------+---------+ Shoulder             0.36        1.10             +-----------------+-------------+----------+---------+ Prox upper arm       0.31        0.68             +-----------------+-------------+----------+---------+ Mid upper arm        0.24        1.00             +-----------------+-------------+----------+---------+ Dist upper arm       0.23        1.00             +-----------------+-------------+----------+---------+ Antecubital fossa    0.47        0.57   branching +-----------------+-------------+----------+---------+ Prox forearm         0.15        0.84             +-----------------+-------------+----------+---------+ Mid forearm          0.24        0.48             +-----------------+-------------+----------+---------+ Dist forearm         0.20        0.38             +-----------------+-------------+----------+---------+ Wrist                0.16                         +-----------------+-------------+----------+---------+  +-----------------+-------------+---------+-----------------------------------+ Left Basilic     Diameter (  cm)  Depth               Findings                                               (cm)                                       +-----------------+-------------+---------+-----------------------------------+ Prox upper arm       0.79                                                  +-----------------+-------------+---------+-----------------------------------+ Mid upper arm        0.45                                                  +-----------------+-------------+---------+-----------------------------------+ Dist upper arm                              tape and bandages and not                                                         visualized              +-----------------+-------------+---------+-----------------------------------+ Antecubital fossa    0.20                                                  +-----------------+-------------+---------+-----------------------------------+ Prox forearm         0.23                                                  +-----------------+-------------+---------+-----------------------------------+ Mid forearm                                      not visualized            +-----------------+-------------+---------+-----------------------------------+ Distal forearm                                   not visualized            +-----------------+-------------+---------+-----------------------------------+ Elbow                                            not visualized            +-----------------+-------------+---------+-----------------------------------+ *  See table(s) above for measurements and observations.  Diagnosing physician: Curt Jews MD Electronically signed by Curt Jews MD on 11/03/2019 at 8:45:25 PM.    Final     Labs: BMET Recent Labs  Lab 10/30/19 0203 10/31/19 0319 11/01/19 0400 11/02/19 0943   NA 133* 134* 135 134*  K 4.3 4.0 4.6 4.3  CL 96* 98 97* 96*  CO2 27 26 27 24   GLUCOSE 87 89 87 140*  BUN 23 19 27* 32*  CREATININE 4.15* 3.86* 4.89* 5.58*  CALCIUM 8.8* 8.8* 9.3 9.5  PHOS 2.8 2.8 2.9 3.3   CBC Recent Labs  Lab 10/31/19 0319 11/01/19 0400 11/02/19 0305 11/03/19 0355  WBC 5.0 5.6 4.8 4.6  HGB 8.3* 8.8* 9.1* 8.4*  HCT 28.0* 28.8* 30.3* 28.2*  MCV 83.6 82.5 84.2 84.9  PLT 153 168 171 157   Medications:    . allopurinol  200 mg Oral Daily  . atorvastatin  40 mg Oral Daily  . Chlorhexidine Gluconate Cloth  6 each Topical Daily  . Chlorhexidine Gluconate Cloth  6 each Topical Q0600  . feeding supplement (NEPRO CARB STEADY)  237 mL Oral Q24H  . mouth rinse  15 mL Mouth Rinse BID  . metoprolol succinate  12.5 mg Oral BID  . multivitamin  1 tablet Oral QHS  . vitamin C  500 mg Oral Daily  . zinc sulfate  220 mg Oral Daily   Elmarie Shiley, MD 11/05/2019, 8:52 AM

## 2019-11-05 NOTE — Progress Notes (Signed)
Patient has been accepted at [facility] on a MWF schedule with a seat time of 6:00am. She needs to arrive to the clinic at 5:40am. Nephrologist and Primary notified.  If patient cleared for discharge today, it would be ideal if patient could report to the clinic today after discharge to sign consents, since her chair time is so early in the morning. Per Dr. Patel/Nephrologist, patient can wait until Monday, 11/08/19 for next treatment. Renal Navigator contacted patient's daughter to inform.  Alphonzo Cruise, [facility]al Navigator [phone]

## 2019-11-06 NOTE — Progress Notes (Deleted)
CARDIOLOGY OFFICE NOTE  Date:  11/06/2019    Antonieta Loveless Date of Birth: 12/05/1945 Medical Record #[id number]  PCP:  System, Provider Not In  Cardiologist:  Servando Snare & ***    No chief complaint on file.   History of Present Illness: Melissa Adams is a 73 y.o. female who presents today for a ***   73 y/o female, COVID +, lives in [place] and followed by cardiologist there for chronic systolic HF, mitral valve regurgitation and tricuspid valve regurgitation, who was scheduled to undergo both mitral and tricuspid valve replacement surgery on 11/18 in [place]. Also w/ h/o atrial fibrillation on chronic a/c w/ Eliquis, OSA on CPAP,  Stage III CKD and HTN. Per H&P, Echocardiogram on August 05, 2019 noted an EF of 40 to 45% (admitting team obtained this by calling patient's cardiologist, Dr. Ervin Knack).  She presented to the [facility] 11/16 w/ CC of progressive SOB and wt gain and LEE over the last 2-3 week. Pt reports gain of 22 lb over recent weeks. She was instructed starting 10/26 to increase her lasix to 160 mg daily by her primary cardiologist in [place]. She did this for several days and had repeat laboratory work which showed apparent increase in SCr and BUN and she was advised to stop diuretics. Condition worsened prompting her to come to the ED for evaluation. Also endorsed palpitations in the ED.   She was found to have markedly elevated BNP, 3,374 and markedly volume overloaded. Hs troponin mildly elevated w/ flat trend 30>>33>>27>>31. SCr 1.91 on admit. BUN 47. K 4.1. COVID +. CXR showed cardiomegaly w/ small rt pleural effusion w/ bibasilar atelectasis. No edema or consolidation. EKG showed ? Junctional rhythm w/ frequent PVCs. D-dimer 12.41. started on IV heparin. V/Q scan showed no evidence of PE. Pt was admitted by IM and started on IV Lasix for diuresis.   Net I/Os recorded at -1.2L however not accurate as they were not being recorded initially. SCr improving, down from  1.97>>1.82. 2D echo completed yesterday showed severely reduced LVEF, 20-25%. RV systolic function also severely reduced. LA moderately dilated. RA severely dilated. MR reported to be mild-moderate w/o stenosis. Moderate TV regurgitation noted. Pulmonary artery pressure moderately elevated at 45 mmHg.   She is currently on supplemental O2 requirements. She has been started on remdesivir, Decadronandzinc.  Appears to be in afib on tele w/ lots of ventricular ectopy/ PVCs. HR 120s.   Echo 10/11/19   1. Left ventricular ejection fraction, by visual estimation, is 20 to 25%. The left ventricle has severely decreased function. There is no left ventricular hypertrophy. 2. Left ventricular diastolic function could not be evaluated. 3. Mildly dilated left ventricular internal cavity size. 4. Global right ventricle has severely reduced systolic function.The right ventricular size is mildly enlarged. 5. Left atrial size was moderately dilated. 6. Right atrial size was severely dilated. 7. The mitral valve is abnormal. Mild to moderate mitral valve regurgitation. No evidence of mitral stenosis. 8. The tricuspid valve is normal in structure. Tricuspid valve regurgitation moderate. 9. The aortic valve is tricuspid. Aortic valve regurgitation is not visualized. Mild aortic valve sclerosis without stenosis. 10. The pulmonic valve was normal in structure. Pulmonic valve regurgitation is mild. 11. Moderately elevated pulmonary artery systolic pressure. 12. The inferior vena cava is dilated in size with <50% respiratory variability, suggesting right atrial pressure of 15 mmHg. 13. Severe global reduction in LV systolic function; 4 chamber enlargement; mild to moderate MR; severely reduced RV function;  moderate TR.      female with a history of of heart failure, hypertension, OSA, severeMR/TR, CKD 3, obesity. She presented secondary to dyspnea and found to have acute heart failure. Patient also  with COVID-19, treated with Remdesivir/Decadron. 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  Vascular surgery placed temporary dialysis catheter  Hospital Course:   Acute on chronic combined systolic and diastolic heart failure:EF of 20-25%. Initially managed on milrinone, Lasix by heart failure team. -now on dialysis. contHDfor vomume management. HD on MWF  CKD V. Now ESRD: Anuric. Temporary dialysis catheter placed. -transitioned to ESRD per nephrology  Hyponatremia:Low of 119.Improved to 134 on 11/02/2019.Treated with Tolvaptanand CRRT.  Chronic atrial fibrillation:Patient initially managed on amiodarone with attempt for DCCV x3, unsuccessful. -contmetoprolol. Rate controlled. On Eliquis as an outpatient.  COVID-19 infection:Patient treated with Remdesivir and Dexamethasone. Symptoms resolved. Now off of isolation.Tested negative on 11/03/2019.  Right LL pneumonia:Cefepime x5 days. Completed 11/04/2019.  Anemia of chronic disease:In setting of kidney disease. As per nephrology.  Essential hypertension:Normotensive/slightly soft. On metoprolol  Severe MR/TR:Noted. Outpatient cardiology follow-up.  Obstructive sleep apnea:Continue CPAPas at home.  Stage II pressure injury:Right medial labia, not present on admission.  The patient {does/does not:200015} have symptoms concerning for COVID-19 infection (fever, chills, cough, or new shortness of breath).   Comes in today. Here with   Past Medical History:  Diagnosis Date  . A-fib ([place])   . Cardiomyopathy ([place])   . CHF (congestive heart failure) ([place])   . Edema   . Hypertension   . Pulmonary hypertension ([place])   . Sleep apnea   . Tricuspid regurgitation     Past Surgical History:  Procedure Laterality Date  . AV FISTULA PLACEMENT Left 11/04/2019   Procedure: Insertion Of Arteriovenous (Av) Gore-Tex Graft  Arm;  Surgeon: [name], MD;  Location: [place];  Service: Vascular;  Laterality: Left;  . CARDIOVERSION N/A 10/26/2019   Procedure: CARDIOVERSION;  Surgeon: [name], MD;  Location: [facility] ENDOSCOPY;  Service: Cardiovascular;  Laterality: N/A;  . INSERTION OF DIALYSIS CATHETER Right 11/04/2019   Procedure: CONVERT TEMPORARY DIALYSIS CATHETER TO TUNNELED DIALYSIS CATHETER Right Internal Jugular.;  Surgeon: [name], MD;  Location: [place];  Service: Vascular;  Laterality: Right;  . TEE WITHOUT CARDIOVERSION N/A 10/26/2019   Procedure: TRANSESOPHAGEAL ECHOCARDIOGRAM (TEE);  Surgeon: [name], MD;  Location: [facility] ENDOSCOPY;  Service: Cardiovascular;  Laterality: N/A;     Medications: No outpatient medications have been marked as taking for the 11/10/19 encounter (Appointment) with Burtis Junes, NP.     Allergies: No Known Allergies  Social History: The patient  reports that she has never smoked. She has never used smokeless tobacco. She reports previous alcohol use.   Family History: The patient's ***family history includes CAD in her father; Diabetes in her sister; Hypertension in her sister; Lupus in her sister; Seizures in her father.   Review of Systems: Please see the history of present illness.   All other systems are reviewed and negative.   Physical Exam: VS:  There were no vitals taken for this visit. Marland Kitchen  BMI There is no height or weight on file to calculate BMI.  Wt Readings from Last 3 Encounters:  11/05/19 244 lb (110.7 kg)    General: Pleasant. Well developed, well nourished and in no acute distress.   HEENT: Normal.  Neck: Supple, no JVD, carotid bruits, or  masses noted.  Cardiac: ***Regular rate and rhythm. No murmurs, rubs, or gallops. No edema.  Respiratory:  Lungs are clear to auscultation bilaterally with normal work of breathing.  GI: Soft and nontender.  MS: No deformity or atrophy. Gait and ROM intact.  Skin: Warm and dry.  Color is normal.  Neuro:  Strength and sensation are intact and no gross focal deficits noted.  Psych: Alert, appropriate and with normal affect.   LABORATORY DATA:  EKG:  EKG {ACTION; IS/IS OHY:[id number]} ordered today. This demonstrates ***.  Lab Results  Component Value Date   WBC 4.6 11/03/2019   HGB 8.4 (L) 11/03/2019   HCT 28.2 (L) 11/03/2019   PLT 157 11/03/2019   GLUCOSE 140 (H) 11/02/2019   ALT 21 10/27/2019   AST 19 10/27/2019   NA 134 (L) 11/02/2019   K 4.3 11/02/2019   CL 96 (L) 11/02/2019   CREATININE 5.58 (H) 11/02/2019   BUN 32 (H) 11/02/2019   CO2 24 11/02/2019   INR 1.2 10/26/2019   HGBA1C 6.6 (H) 10/17/2019     BNP (last 3 results) Recent Labs    10/11/19 1309  BNP 3,374.7*    ProBNP (last 3 results) No results for input(s): PROBNP in the last 8760 hours.   Other Studies Reviewed Today:   Assessment/Plan: Assessment/Plan   1. Acute on chronic systolic CHF:  Nonischemic cardiomyopathy, cath in [place] in 9/20 with no significant coronary disease.  Echo with EF 20-25% with dilated and severely dysfunctional RV with D-shaped interventricular septum. MR appears moderate and TR appears moderate. With dilated RV, V/Q scan was done and was negative. Biventricular failure. Last echo in [place] per report showed EF 40-45%. Cause of fall in EF uncertain, could be due to atrial fibrillation with RVR (tachycardia-mediated).  Apparently chronic AF but last clinic note from outpatient cardiologist outlines plan for possible atrial fibrillation ablation. Myocarditis, potentially from COVID-19, is possible. It is also possible that she has a stress cardiomyopathy due to medical illness/COVID-19 infection.  I have been concerned for low output HF with rise in creatinine and difficulty with diuresis.  Started milrinone at 0.25, milrinone now off with stable co-ox.  With poor diuresis, CVVH started 11/23 and Lasix gtt stopped.  CVVH stopped 11/29.  Poor response to  high dose IV Lasix, she appears to be HD-dependent.  She has started intermittent HD.   - It looks like she is going to need HD going forwards for volume management => HD and future diuretic trials per nephrology.  2. Valvular heart disease: Patient was supposed to have mitral and tricuspid valve replacements this week.  TEE in 9/20 in [place] showed mod-severe MR and severe TR. On my read of the echo done here, she has moderate MR and moderate TR (both central and likely functional). TEE on 12/1 showed mod-severe TR and moderate MR.  Her main issue here seems to be biventricular failure and would hold off on valve procedures for the time being.   3. Atrial fibrillation: Chronic per outpatient notes. Per daughter, she has had cardioversions in the past. There apparently has been consideration for atrial fibrillation ablation. HR controlled in 80s-90s off milrinone.  She failed DCCV on amiodarone on 12/1. I think she is unlikely to go back into NSR at this point.  She is now off amiodarone.  - Continue Toprol XL 25 mg bid for rate control.  - Continue heparin gtt for now, transition to Eliquis eventually when no further procedures anticipated.  4.  COVID-19 infection: No PNA on CXR. On supplemental O2 but this may be due to CHF. Seems to be doing ok from this standpoint. Finished remdesivir.  5. AKI: Per report, baseline creatinine around 1.2, increased up to 3.47. Suspect primarily cardiorenal. She was volume overloaded and did not diurese adequately, so CVVH started. CVVH stopped 11/29, poor response to high dose IV Lasix.  Intermittent HD started on 12/1.  - Ongoing management per renal.  6. Hyponatremia: Hypervolemic hypernatremia.  She got tolvaptan on 11/22.  Improved with CVVH.  At this point, appears that she will be HD dependent.  She will likely remain in atrial fibrillation but HR controlled.  We will follow at a distance.     Marland Kitchen COVID-19 Education: The signs and symptoms of COVID-19  were discussed with the patient and how to seek care for testing (follow up with PCP or arrange E-visit).  The importance of social distancing, staying at home, hand hygiene and wearing a mask when out in public were discussed today.  Current medicines are reviewed with the patient today.  The patient does not have concerns regarding medicines other than what has been noted above.  The following changes have been made:  See above.  Labs/ tests ordered today include:   No orders of the defined types were placed in this encounter.    Disposition:   FU with *** in {gen number 1-63:846659} {Days to years:10300}.   Patient is agreeable to this plan and will call if any problems develop in the interim.   SignedTruitt Merle, NP  11/06/2019 9:48 AM  [facility] [address] [place], [place]  [address] Phone: [phone] Fax: [phone]

## 2019-11-07 ENCOUNTER — Telehealth: Payer: Self-pay | Admitting: Nephrology

## 2019-11-07 NOTE — Telephone Encounter (Signed)
Transition of care contact from inpatient facility  Date of Discharge: 12 /11/20 Date of Contact: 11/07/19 Method of contact: phone  Talked with: daughter  Patient contacted to discuss transition of care from recent inpatient hospitalization. Pateint was admitted to [facility] from   11/16 - 11/04/29 with the diagnosis of new ESRD secondary to prolonged AKI in the setting of CKD, recent COVID PNA, OSA on CPAP (but not actively using--issues with needing new supplies others - something didn't fit right - supplies in [place]), atrial fibrillation on Elliquis, valvular heart disease - to f/u with cardiology 12/16.  CHF with EF 20 - 25%. Patient has relocated here to live with her daughter who is in the process of becoming her POA as patient cannot manage things on her own.  Medication changes were reviewed with daughter - has all meds and good understanding  Patient will follow up at outpatient dialysis on 11/08/19 [place].  Other follow up needs - has appt with [facility] 1/26.  Needs to get a PCP and pulmonologist - d/w daughter.  Amalia Hailey, PA-C [facility] Pager:  [phone]

## 2019-11-08 ENCOUNTER — Telehealth: Payer: Self-pay | Admitting: *Deleted

## 2019-11-08 NOTE — Telephone Encounter (Signed)
Pt sch to see Dr Cephus Richer 12/16

## 2019-11-08 NOTE — Telephone Encounter (Signed)
-----   Message from Burtis Junes, NP sent at 11/07/2019  8:31 AM EST ----- We need to do this please.   lori ----- Message ----- From: [name], MD Sent: 11/06/2019   8:54 PM EST To: Burtis Junes, NP  Yes, we should see her in CHF clinic.  Can switch her onto PA/NP schedule at CHF office.  ----- Message ----- From: Burtis Junes, NP Sent: 11/06/2019  10:23 AM EST To: [name], MD  Dr. Aundra Dubin,  Were you planning on seeing this patient back in CHF clinic?  On my schedule for later this week.   Your thoughts.  Cecille Rubin

## 2019-11-10 ENCOUNTER — Other Ambulatory Visit: Payer: Self-pay

## 2019-11-10 ENCOUNTER — Encounter (HOSPITAL_COMMUNITY): Payer: Self-pay | Admitting: Cardiology

## 2019-11-10 ENCOUNTER — Ambulatory Visit: Payer: Medicare Other | Admitting: Nurse Practitioner

## 2019-11-10 ENCOUNTER — Ambulatory Visit (HOSPITAL_COMMUNITY)
Admission: RE | Admit: 2019-11-10 | Discharge: 2019-11-10 | Disposition: A | Discharge status: Home / Self Care | Payer: Medicare Other | Source: Ambulatory Visit | Attending: Cardiology | Admitting: Cardiology

## 2019-11-10 VITALS — BP 132/64 | HR 86 | Wt 247.4 lb

## 2019-11-10 DIAGNOSIS — Z79899 Other long term (current) drug therapy: Secondary | ICD-10-CM | POA: Insufficient documentation

## 2019-11-10 DIAGNOSIS — Z7901 Long term (current) use of anticoagulants: Secondary | ICD-10-CM | POA: Insufficient documentation

## 2019-11-10 DIAGNOSIS — I081 Rheumatic disorders of both mitral and tricuspid valves: Secondary | ICD-10-CM | POA: Insufficient documentation

## 2019-11-10 DIAGNOSIS — I428 Other cardiomyopathies: Secondary | ICD-10-CM | POA: Diagnosis not present

## 2019-11-10 DIAGNOSIS — Z8249 Family history of ischemic heart disease and other diseases of the circulatory system: Secondary | ICD-10-CM | POA: Insufficient documentation

## 2019-11-10 DIAGNOSIS — N186 End stage renal disease: Secondary | ICD-10-CM | POA: Diagnosis not present

## 2019-11-10 DIAGNOSIS — E785 Hyperlipidemia, unspecified: Secondary | ICD-10-CM | POA: Insufficient documentation

## 2019-11-10 DIAGNOSIS — I482 Chronic atrial fibrillation, unspecified: Secondary | ICD-10-CM | POA: Insufficient documentation

## 2019-11-10 DIAGNOSIS — I5023 Acute on chronic systolic (congestive) heart failure: Secondary | ICD-10-CM | POA: Diagnosis not present

## 2019-11-10 DIAGNOSIS — I132 Hypertensive heart and chronic kidney disease with heart failure and with stage 5 chronic kidney disease, or end stage renal disease: Secondary | ICD-10-CM | POA: Insufficient documentation

## 2019-11-10 DIAGNOSIS — I5022 Chronic systolic (congestive) heart failure: Secondary | ICD-10-CM

## 2019-11-10 DIAGNOSIS — R0602 Shortness of breath: Secondary | ICD-10-CM | POA: Diagnosis present

## 2019-11-10 DIAGNOSIS — G4733 Obstructive sleep apnea (adult) (pediatric): Secondary | ICD-10-CM | POA: Insufficient documentation

## 2019-11-10 DIAGNOSIS — Z8619 Personal history of other infectious and parasitic diseases: Secondary | ICD-10-CM | POA: Diagnosis not present

## 2019-11-10 DIAGNOSIS — Z992 Dependence on renal dialysis: Secondary | ICD-10-CM | POA: Diagnosis not present

## 2019-11-10 DIAGNOSIS — M109 Gout, unspecified: Secondary | ICD-10-CM | POA: Diagnosis not present

## 2019-11-10 MED ORDER — METOPROLOL SUCCINATE ER 25 MG PO TB24: 25.0000 mg | ORAL_TABLET | Freq: Two times a day (BID) | ORAL | 5 refills | Status: DC

## 2019-11-10 NOTE — Patient Instructions (Signed)
No labs done today.  INCREASE Metoprolol 25mg (1 tab) by mouth twice daily.  Please continue all other medications as prescribed.  Your physician recommends that you schedule a follow-up appointment in: 6 weeks  You have been referred to home health for physical therapy. That office will contact you to schedule an appointment.  At the [facility], you and your health needs are our priority. As part of our continuing mission to provide you with exceptional heart care, we have created designated Provider Care Teams. These Care Teams include your primary Cardiologist (physician) and Advanced Practice Providers (APPs- Physician Assistants and Nurse Practitioners) who all work together to provide you with the care you need, when you need it.   You may see any of the following providers on your designated Care Team at your next follow up: Marland Kitchen Dr Glori Bickers . Dr [name] . Darrick Grinder, NP . Lyda Jester, PA . Audry Riles, PharmD   Please be sure to bring in all your medications bottles to every appointment.

## 2019-11-11 NOTE — Progress Notes (Signed)
Cardiology: Dr. Aundra Dubin  73 y.o. with history of chronic systolic HF, mitral valve regurgitation and tricuspid valve regurgitation was scheduled to undergo both mitral and tricuspid valve replacement surgery on 10/13/19 in [place], [place].  He had a prior cath in 9/20 with no significant coronary disease.  Also w/ h/o atrial fibrillation on chronic anticoagulation w/ Eliquis, OSA on CPAP, and HTN.  Echocardiogram on August 05, 2019 in [place] noted an EF of 40 to 45% with severe MR and TR.  She presented to the [facility] 10/11/19 with progressive SOB and wt gain and edema over the prior 2-3 weeks.  She had been staying with her daughter in [place].  She was found in the ER to have markedly elevated BNP and marked volume overload. Hs troponin mildly elevated w/ flat trend 30>>33>>27>>31. SCr 1.91 on admit. BUN 47. K 4.1. COVID-19 +.  D-dimer 12.41. started on IV heparin. V/Q scan showed no evidence of PE. She was in atrial fibrillation with RVR.  Echo was done, showing EF down to 20-25%. She was started on milrinone and diuresed. Despite this, creatinine steadily rose. She ultimately required CVVH.  Significant volume was removed.  Renal function did not recover and she was started on iHD.  TEE-guided DCCV was attempted but she did not stay in NSR.  Amiodarone was stopped as she did not maintain NSR. She was treated with remdesivir and Decadron for COVID-19.   She is now living with her daughter.  She has had 2 wks of HD without problems.  She is short of breath after walking 50 yards.  She uses a walker.  Minimal UOP. No chest pain, no lightheadedness.  She does not report hypotension with HD.  She remains weak after prolonged hospitalization.   ECG: Atrial fibrillation (personally reviewed).  PMH: 1. Gout 2. Chronic atrial fibrillation: She failed TEE-guided DCCV in 12/20.  Prior DCCVs attempted in [place].  3. OSA: Uses CPAP.  4. COVID-19 infection: 11/20-12/20. Treated with remdesivir + dexamethasone.   5. AKI 11/20 with development of ESRD.  6. HTN 7. Chronic systolic CHF: 5/10 [facility] in [place], no significant coronary disease.  - [facility] EF 40-45% with severe MR and TR.   - TEE (12/20): EF 20%, diffuse hypokinesis, moderately decreased RV systolic function, moderate-severe TR, peak RV-RA gradient 46 mmHg, moderate functional MR.  8. Hyperlipidemia  Social History   Socioeconomic History  . Marital status: Widowed    Spouse name: Not on file  . Number of children: Not on file  . Years of education: Not on file  . Highest education level: Not on file  Occupational History  . Not on file  Tobacco Use  . Smoking status: Never Smoker  . Smokeless tobacco: Never Used  Substance and Sexual Activity  . Alcohol use: Not Currently  . Drug use: Not on file  . Sexual activity: Not Currently  Other Topics Concern  . Not on file  Social History Narrative  . Not on file   Social Determinants of Health   Financial Resource Strain:   . Difficulty of Paying Living Expenses: Not on file  Food Insecurity:   . Worried About Charity fundraiser in the Last Year: Not on file  . Ran Out of Food in the Last Year: Not on file  Transportation Needs:   . Lack of Transportation (Medical): Not on file  . Lack of Transportation (Non-Medical): Not on file  Physical Activity:   . Days of Exercise per  Week: Not on file  . Minutes of Exercise per Session: Not on file  Stress:   . Feeling of Stress : Not on file  Social Connections:   . Frequency of Communication with Friends and Family: Not on file  . Frequency of Social Gatherings with Friends and Family: Not on file  . Attends Religious Services: Not on file  . Active Member of Clubs or Organizations: Not on file  . Attends Archivist Meetings: Not on file  . Marital Status: Not on file  Intimate Partner Violence:   . Fear of Current or Ex-Partner: Not on file  . Emotionally Abused: Not on file  . Physically Abused:  Not on file  . Sexually Abused: Not on file   Family History  Problem Relation Age of Onset  . Seizures Father   . CAD Father   . Diabetes Sister   . Lupus Sister   . Hypertension Sister    ROS: All systems reviewed and negative except as per HPI.   Current Outpatient Medications  Medication Sig Dispense Refill  . acetaminophen (TYLENOL) 325 MG tablet Take 2 tablets (650 mg total) by mouth every 4 (four) hours as needed for headache or mild pain.    Marland Kitchen allopurinol (ZYLOPRIM) 300 MG tablet Take 300 mg by mouth daily.    Marland Kitchen apixaban (ELIQUIS) 5 MG TABS tablet Take 5 mg by mouth 2 (two) times daily.    Marland Kitchen atorvastatin (LIPITOR) 40 MG tablet Take 40 mg by mouth daily.    . colchicine 0.6 MG tablet Take 0.6 mg by mouth as needed (for Gout).     . metoprolol succinate (TOPROL-XL) 25 MG 24 hr tablet Take 1 tablet (25 mg total) by mouth 2 (two) times daily. 60 tablet 5  . montelukast (SINGULAIR) 10 MG tablet Take 10 mg by mouth daily.    . multivitamin (RENA-VIT) TABS tablet Take 1 tablet by mouth at bedtime. 30 tablet 0  . vitamin C (VITAMIN C) 500 MG tablet Take 1 tablet (500 mg total) by mouth daily. 30 tablet 0   No current facility-administered medications for this encounter.   BP 132/64   Pulse 86   Wt 112.2 kg (247 lb 6.4 oz)   SpO2 98%   BMI 43.82 kg/m  General: NAD Neck: JVP 10-12 cm, no thyromegaly or thyroid nodule.  Lungs: Clear to auscultation bilaterally with normal respiratory effort. CV: Nondisplaced PMI.  Heart irregular S1/S2, no S3/S4, 2/6 HSM LLSB/apex.  2+ edema 1/2 to knees bilaterally.  No carotid bruit.  Normal pedal pulses.  Abdomen: Soft, nontender, no hepatosplenomegaly, no distention.  Skin: Intact without lesions or rashes.  Neurologic: Alert and oriented x 3.  Psych: Normal affect. Extremities: No clubbing or cyanosis.  HEENT: Normal.   Assessment/Plan:  1. Acute on chronic systolic CHF:  Nonischemic cardiomyopathy, cath in [place] in 9/20 with no  significant coronary disease.  Echo in 11/20 with EF 20-25% with dilated and severely dysfunctional RV with D-shaped interventricular septum. MR appeared moderate and TR appeared moderate. With dilated RV, V/Q scan was done and was negative. Biventricular failure. Last echo in [place] per report showed EF 40-45%. Cause of fall in EF uncertain, could be due to atrial fibrillation with RVR (tachycardia-mediated). Myocarditis, potentially from COVID-19, is possible. It is also possible that she has a stress cardiomyopathy due to medical illness/COVID-19 infection.  She is now HD-dependent for volume control.  She is volume overloaded on exam today, NYHA class  III  symptoms. - Increase Toprol XL to 25 mg bid.  - I think she needs more fluid off with dialysis sessions (volume overloaded).  2. Valvular heart disease: Patient was supposed to have mitral and tricuspid valve replacements in 11/20 in [place].  TEE in 9/20 in [place] showed mod-severe MR and severe TR. On my read of the echo done here, she has moderate MR and moderate TR (both central and likely functional). TEE on 10/26/19 showed mod-severe TR and moderate MR.  Her main issue here seems to be biventricular failure and would hold off on valve procedures for the time being.   3. Atrial fibrillation: Chronic per outpatient notes. Per daughter, she has had cardioversions in the past. She failed DCCV on amiodarone on 10/26/19. I think she is unlikely to go back into NSR at this point.  She is now off amiodarone.  - Increase Toprol XL to 25 mg bid for rate control.  - Continue Eliquis 5 mg bid.  4. ESRD: Per renal.  Minimal urine output, seems unlikely to resolve.   [name] 11/11/2019

## 2019-11-25 ENCOUNTER — Other Ambulatory Visit (HOSPITAL_COMMUNITY): Payer: Self-pay

## 2019-11-25 DIAGNOSIS — I5022 Chronic systolic (congestive) heart failure: Secondary | ICD-10-CM

## 2019-11-25 NOTE — Progress Notes (Signed)
Orders Placed This Encounter  Procedures   Ambulatory referral to Home Health    Referral Priority:   Routine    Referral Type:   Home Health Care    Referral Reason:   Specialty Services Required    Requested Specialty:   Home Health Services    Number of Visits Requested:   1    

## 2019-11-30 ENCOUNTER — Encounter (INDEPENDENT_AMBULATORY_CARE_PROVIDER_SITE_OTHER): Payer: Self-pay | Admitting: Primary Care

## 2019-11-30 ENCOUNTER — Other Ambulatory Visit: Payer: Self-pay

## 2019-11-30 ENCOUNTER — Ambulatory Visit (INDEPENDENT_AMBULATORY_CARE_PROVIDER_SITE_OTHER): Payer: Self-pay | Admitting: Primary Care

## 2019-11-30 DIAGNOSIS — N179 Acute kidney failure, unspecified: Secondary | ICD-10-CM

## 2019-11-30 DIAGNOSIS — Z09 Encounter for follow-up examination after completed treatment for conditions other than malignant neoplasm: Secondary | ICD-10-CM

## 2019-11-30 DIAGNOSIS — R0602 Shortness of breath: Secondary | ICD-10-CM

## 2019-11-30 DIAGNOSIS — I5021 Acute systolic (congestive) heart failure: Secondary | ICD-10-CM

## 2019-11-30 DIAGNOSIS — Z7689 Persons encountering health services in other specified circumstances: Secondary | ICD-10-CM

## 2019-11-30 DIAGNOSIS — R600 Localized edema: Secondary | ICD-10-CM

## 2019-11-30 DIAGNOSIS — R079 Chest pain, unspecified: Secondary | ICD-10-CM

## 2019-11-30 MED ORDER — ALLOPURINOL 300 MG PO TABS: 300.0000 mg | ORAL_TABLET | Freq: Every day | ORAL | 1 refills | Status: DC

## 2019-11-30 MED ORDER — ALBUTEROL SULFATE HFA 108 (90 BASE) MCG/ACT IN AERS: 2.0000 | INHALATION_SPRAY | Freq: Four times a day (QID) | RESPIRATORY_TRACT | 1 refills | Status: DC | PRN

## 2019-11-30 MED ORDER — ATORVASTATIN CALCIUM 40 MG PO TABS: 40.0000 mg | ORAL_TABLET | Freq: Every day | ORAL | 1 refills | Status: DC

## 2019-11-30 MED ORDER — MONTELUKAST SODIUM 10 MG PO TABS: 10.0000 mg | ORAL_TABLET | Freq: Every day | ORAL | 1 refills | Status: DC

## 2019-11-30 NOTE — Progress Notes (Signed)
Virtual Visit via Telephone Note  I connected with Melissa Adams on 11/30/19 at  9:30 AM EST by telephone and verified that I am speaking with the correct person using two identifiers.   I discussed the limitations, risks, security and privacy concerns of performing an evaluation and management service by telephone and the availability of in person appointments. I also discussed with the patient that there may be a patient responsible charge related to this service. The patient expressed understanding and agreed to proceed.   History of Present Illness: Melissa Adams is for a hospital follow up.and establish care with PCP. Several ed to hospital admission for chronic systolic CHF. She does have shortness of breath, chest pain and lower extremity edema. Denies headaches    Observations/Objective: Review of Systems  Respiratory: Positive for cough and shortness of breath.   Cardiovascular: Positive for leg swelling.  Genitourinary:       HD  All other systems reviewed and are negative.    Assessment and Plan: Melissa Adams was seen today for hospitalization follow-up.  Diagnoses and all orders for this visit:  Encounter to establish care [name], NP-C will be your  (PCP) mastered prepared that is able to that will  diagnosed and treatment able to answer health concern as well as continuing care of varied medical conditions, not limited by cause, organ system, or diagnosis.   Acute systolic congestive heart failure [facility]) Recently establish care with Dr. Aundra Dubin cardiology for  chronic systolic HF, mitral valve regurgitation and tricuspid valve regurgitation 11/182020 for valve replacement . Atrial fibrillation anticoagulation with Eliquis.  AKI (acute kidney injury) ([place]) ESRD followed by nephrology receiving HD  Hospital discharge follow-upPreviously treated for COVID + in hospital with  Remdesivir and Dexamethasone. Tested negative on 11/03/2019. Recommended follow up with  Vascular surgery . Establish PCp  Other orders -     atorvastatin (LIPITOR) 40 MG tablet; Take 1 tablet (40 mg total) by mouth daily. -     allopurinol (ZYLOPRIM) 300 MG tablet; Take 1 tablet (300 mg total) by mouth daily. -     montelukast (SINGULAIR) 10 MG tablet; Take 1 tablet (10 mg total) by mouth daily. -     albuterol (VENTOLIN HFA) 108 (90 Base) MCG/ACT inhaler; Inhale 2 puffs into the lungs every 6 (six) hours as needed for wheezing or shortness of breath.    Follow Up Instructions:    I discussed the assessment and treatment plan with the patient. The patient was provided an opportunity to ask questions and all were answered. The patient agreed with the plan and demonstrated an understanding of the instructions.   The patient was advised to call back or seek an in-person evaluation if the symptoms worsen or if the condition fails to improve as anticipated.  I provided 32  minutes of non-face-to-face time during this encounter. To include chart review labs , imagings    [name], NP

## 2019-11-30 NOTE — Progress Notes (Signed)
80 [address] daughter speaking with patient permission Pt complains that when she lays down at night she gets hot and feels like she cant breathe Pt needs order for sleep study

## 2019-12-10 ENCOUNTER — Telehealth (HOSPITAL_COMMUNITY): Payer: Self-pay | Admitting: Licensed Clinical Social Worker

## 2019-12-10 NOTE — Telephone Encounter (Signed)
CSW received call from pt daughter requesting help getting home health set up.  States the pt was supposed to get [facility] after DC from hospital on 12/11 but she didn't have the right insurance- they have now changed over to [employer] and pt is working on getting [place] Medicaid and would like to see if she can now get services- prefers [facility].  CSW called Tommi Rumps with [facility] and informed- he will have team follow up with the pt daughter based on [facility] order placed by Dr. Aundra Dubin during post discharge clinic visit.  CSW updated pt daughter and will continue to follow through clinic and assist as needed  Jorge Ny, [facility] Desk#: [phone] Cell#: [phone]

## 2019-12-12 DIAGNOSIS — I132 Hypertensive heart and chronic kidney disease with heart failure and with stage 5 chronic kidney disease, or end stage renal disease: Secondary | ICD-10-CM

## 2019-12-15 ENCOUNTER — Other Ambulatory Visit (HOSPITAL_COMMUNITY): Payer: Self-pay

## 2019-12-15 ENCOUNTER — Telehealth (HOSPITAL_COMMUNITY): Payer: Self-pay | Admitting: Cardiology

## 2019-12-15 DIAGNOSIS — I5022 Chronic systolic (congestive) heart failure: Secondary | ICD-10-CM

## 2019-12-15 MED ORDER — APIXABAN 5 MG PO TABS: 5.0000 mg | ORAL_TABLET | Freq: Two times a day (BID) | ORAL | 11 refills | Status: DC

## 2019-12-15 NOTE — Telephone Encounter (Signed)
Patients Melissa Adams provider called after evaluation to report oxygen sats dropped into the 70's,patient was unable to complete 2 min walk test due to fatigue and increased SOB, patient is unable to walk >50 ft without rest breaks. CPAP machine was evaluated and noted to be broken as well   Spoke with patients family to advise will need pulmonary referral for CPAP/ sleep apnea management. Family reports the machine was given to her over 5 years ago and would very much appreciate a referral to have this evaluated.  Also referral for home oxygen placed.

## 2019-12-20 ENCOUNTER — Telehealth (HOSPITAL_COMMUNITY): Payer: Self-pay

## 2019-12-20 NOTE — Telephone Encounter (Signed)

## 2019-12-21 ENCOUNTER — Ambulatory Visit (INDEPENDENT_AMBULATORY_CARE_PROVIDER_SITE_OTHER): Payer: Self-pay | Admitting: Physician Assistant

## 2019-12-21 ENCOUNTER — Other Ambulatory Visit: Payer: Self-pay

## 2019-12-21 ENCOUNTER — Ambulatory Visit (HOSPITAL_COMMUNITY)
Admission: RE | Admit: 2019-12-21 | Discharge: 2019-12-21 | Disposition: A | Discharge status: Home / Self Care | Payer: Medicare HMO | Source: Ambulatory Visit | Attending: Vascular Surgery | Admitting: Vascular Surgery

## 2019-12-21 VITALS — BP 100/64 | HR 102 | Temp 97.0°F | Resp 18 | Ht 63.0 in | Wt 240.4 lb

## 2019-12-21 DIAGNOSIS — N186 End stage renal disease: Secondary | ICD-10-CM

## 2019-12-21 DIAGNOSIS — Z992 Dependence on renal dialysis: Secondary | ICD-10-CM

## 2019-12-21 NOTE — Progress Notes (Signed)
  POST OPERATIVE OFFICE NOTE    CC:  F/u for surgery  HPI:  This is a 74 y.o. female who is s/p  PROCEDURE: 1.  Exchange of right internal jugular vein temporary dialysis catheter for a tunneled dialysis catheter (23 cm palindrome) 2. Left brachiocephalic arteriovenous fistula placement    She denise pain, loss of sensation and loss of motor in her left UE.    No Known Allergies  Current Outpatient Medications  Medication Sig Dispense Refill  . acetaminophen (TYLENOL) 325 MG tablet Take 2 tablets (650 mg total) by mouth every 4 (four) hours as needed for headache or mild pain.    Marland Kitchen albuterol (VENTOLIN HFA) 108 (90 Base) MCG/ACT inhaler Inhale 2 puffs into the lungs every 6 (six) hours as needed for wheezing or shortness of breath. 6.7 g 1  . allopurinol (ZYLOPRIM) 300 MG tablet Take 1 tablet (300 mg total) by mouth daily. 90 tablet 1  . apixaban (ELIQUIS) 5 MG TABS tablet Take 1 tablet (5 mg total) by mouth 2 (two) times daily. 60 tablet 11  . atorvastatin (LIPITOR) 40 MG tablet Take 1 tablet (40 mg total) by mouth daily. 90 tablet 1  . metoprolol succinate (TOPROL-XL) 25 MG 24 hr tablet Take 1 tablet (25 mg total) by mouth 2 (two) times daily. 60 tablet 5  . montelukast (SINGULAIR) 10 MG tablet Take 1 tablet (10 mg total) by mouth daily. 90 tablet 1  . multivitamin (RENA-VIT) TABS tablet Take 1 tablet by mouth at bedtime. 30 tablet 0  . vitamin C (VITAMIN C) 500 MG tablet Take 1 tablet (500 mg total) by mouth daily. 30 tablet 0   No current facility-administered medications for this visit.     ROS:  See HPI  Physical Exam:    Incision:  Well healed incision  Extremities:  Left UE palpable thrill in fistula and palpable radial pulse.  Grip 5/5.   +------------+----------+-------------+----------+----------------+  OUTFLOW VEINPSV (cm/s)Diameter (cm)Depth (cm)  Describe    +------------+----------+-------------+----------+----------------+  Prox UA     121     0.58     0.66            +------------+----------+-------------+----------+----------------+  Mid UA     100    0.59     1.02  competing branch  +------------+----------+-------------+----------+----------------+  Dist UA     120    0.76     0.81  competing branch  +------------+----------+-------------+----------+----------------+  AC Fossa    166    0.66     0.54            +------------+----------+-------------+----------+----------------+    Assessment/Plan:  This is a 74 y.o. female who is s/p:s/p exchange of RIJ temp catheter for [facility] and left brachiocephalic AVF placement.   The fistula is slowly maturing.  It appears to deep for access.  I have asked her to exercise it daily and f/u in 3-4 weeks for repeat duplex.  If it has enlarged in diameter she will likely require   superficialization.     Roxy Horseman , PA-C [facility] [phone]  Clinic MD:  Early

## 2019-12-22 ENCOUNTER — Other Ambulatory Visit: Payer: Self-pay | Admitting: *Deleted

## 2019-12-22 DIAGNOSIS — N186 End stage renal disease: Secondary | ICD-10-CM

## 2019-12-22 NOTE — Telephone Encounter (Signed)
[facility] unable to start patient with home oxygen as a face to face within 30 days and oxygen saturation is needed.   Will get documentation at upcoming appt 1/28 and further discuss pulm referral for cpap  Message to provider nurse/CMA as Juluis Rainier

## 2019-12-23 ENCOUNTER — Ambulatory Visit (HOSPITAL_COMMUNITY)
Admission: RE | Admit: 2019-12-23 | Discharge: 2019-12-23 | Disposition: A | Discharge status: Home / Self Care | Payer: Medicare HMO | Source: Ambulatory Visit | Attending: Cardiology | Admitting: Cardiology

## 2019-12-23 ENCOUNTER — Other Ambulatory Visit: Payer: Self-pay

## 2019-12-23 VITALS — BP 107/53 | HR 97 | Wt 240.0 lb

## 2019-12-23 DIAGNOSIS — Z833 Family history of diabetes mellitus: Secondary | ICD-10-CM | POA: Insufficient documentation

## 2019-12-23 DIAGNOSIS — Z79899 Other long term (current) drug therapy: Secondary | ICD-10-CM | POA: Diagnosis not present

## 2019-12-23 DIAGNOSIS — I132 Hypertensive heart and chronic kidney disease with heart failure and with stage 5 chronic kidney disease, or end stage renal disease: Secondary | ICD-10-CM | POA: Insufficient documentation

## 2019-12-23 DIAGNOSIS — I081 Rheumatic disorders of both mitral and tricuspid valves: Secondary | ICD-10-CM | POA: Diagnosis not present

## 2019-12-23 DIAGNOSIS — I5022 Chronic systolic (congestive) heart failure: Secondary | ICD-10-CM

## 2019-12-23 DIAGNOSIS — I482 Chronic atrial fibrillation, unspecified: Secondary | ICD-10-CM | POA: Insufficient documentation

## 2019-12-23 DIAGNOSIS — G4733 Obstructive sleep apnea (adult) (pediatric): Secondary | ICD-10-CM | POA: Insufficient documentation

## 2019-12-23 DIAGNOSIS — I428 Other cardiomyopathies: Secondary | ICD-10-CM | POA: Diagnosis not present

## 2019-12-23 DIAGNOSIS — I340 Nonrheumatic mitral (valve) insufficiency: Secondary | ICD-10-CM | POA: Diagnosis not present

## 2019-12-23 DIAGNOSIS — Z7901 Long term (current) use of anticoagulants: Secondary | ICD-10-CM | POA: Diagnosis not present

## 2019-12-23 DIAGNOSIS — G4730 Sleep apnea, unspecified: Secondary | ICD-10-CM

## 2019-12-23 DIAGNOSIS — N186 End stage renal disease: Secondary | ICD-10-CM

## 2019-12-23 DIAGNOSIS — Z992 Dependence on renal dialysis: Secondary | ICD-10-CM | POA: Insufficient documentation

## 2019-12-23 DIAGNOSIS — I5082 Biventricular heart failure: Secondary | ICD-10-CM | POA: Diagnosis not present

## 2019-12-23 DIAGNOSIS — E785 Hyperlipidemia, unspecified: Secondary | ICD-10-CM | POA: Insufficient documentation

## 2019-12-23 DIAGNOSIS — Z8249 Family history of ischemic heart disease and other diseases of the circulatory system: Secondary | ICD-10-CM | POA: Diagnosis not present

## 2019-12-23 DIAGNOSIS — M109 Gout, unspecified: Secondary | ICD-10-CM | POA: Diagnosis not present

## 2019-12-23 DIAGNOSIS — Z8616 Personal history of COVID-19: Secondary | ICD-10-CM | POA: Insufficient documentation

## 2019-12-23 DIAGNOSIS — I5023 Acute on chronic systolic (congestive) heart failure: Secondary | ICD-10-CM | POA: Diagnosis not present

## 2019-12-23 MED ORDER — METOPROLOL SUCCINATE ER 25 MG PO TB24: 37.5000 mg | ORAL_TABLET | Freq: Two times a day (BID) | ORAL | 5 refills | Status: DC

## 2019-12-23 NOTE — Patient Instructions (Addendum)
Increase Metoprolol to 37.5 mg (1 & 1/2 tab) Twice daily   Your provider has recommended that you have a home sleep study.  [employer] is the company that does these test.  They will contact you by phone and must speak with you before they can ship the equipment.  Once they have spoken with you they will send the equipment right to your home with instructions on how to set it up.  Once you have completed the test simply box all the equipment back up and mail back to the company.  IF you have any questions or issues with the equipment please call the company directly at [phone].  If your test is positive for sleep apnea and you need a home CPAP machine you will be contacted by Dr Theodosia Blender office [facility]) to set this up.  Your physician recommends that you schedule a follow-up appointment in: 2 months with echocardiogram  If you have any questions or concerns before your next appointment please send [place] a message through [place] or call our office at [phone].  At the [facility], you and your health needs are our priority. As part of our continuing mission to provide you with exceptional heart care, we have created designated Provider Care Teams. These Care Teams include your primary Cardiologist (physician) and Advanced Practice Providers (APPs- Physician Assistants and Nurse Practitioners) who all work together to provide you with the care you need, when you need it.   You may see any of the following providers on your designated Care Team at your next follow up: Marland Kitchen Dr Glori Bickers . Dr [name] . Darrick Grinder, NP . Lyda Jester, PA . Audry Riles, PharmD   Please be sure to bring in all your medications bottles to every appointment.

## 2019-12-24 NOTE — Progress Notes (Signed)
Cardiology: Dr. Aundra Dubin  Nephrology: Dr. Moshe Cipro  74 y.o. with history of chronic systolic HF, mitral valve regurgitation and tricuspid valve regurgitation was scheduled to undergo both mitral and tricuspid valve replacement surgery on 10/13/19 in [place], [place].  He had a prior cath in 9/20 with no significant coronary disease.  Also w/ h/o atrial fibrillation on chronic anticoagulation w/ Eliquis, OSA on CPAP, and HTN.  Echocardiogram on August 05, 2019 in [place] noted an EF of 40 to 45% with severe MR and TR.  She presented to the [facility] 10/11/19 with progressive SOB and wt gain and edema over the prior 2-3 weeks.  She had been staying with her daughter in [place].  She was found in the ER to have markedly elevated BNP and marked volume overload. Hs troponin mildly elevated w/ flat trend 30>>33>>27>>31. SCr 1.91 on admit. BUN 47. K 4.1. COVID-19 +.  D-dimer 12.41. started on IV heparin. V/Q scan showed no evidence of PE. She was in atrial fibrillation with RVR.  Echo was done, showing EF down to 20-25%. She was started on milrinone and diuresed. Despite this, creatinine steadily rose. She ultimately required CVVH.  Significant volume was removed.  Renal function did not recover and she was started on iHD.  TEE-guided DCCV was attempted but she did not stay in NSR.  Amiodarone was stopped as she did not maintain NSR. She was treated with remdesivir and Decadron for COVID-19.   She is now living with her daughter.  She is tolerating HD so far. However, she still has significant exertional dyspnea.  She is short of breath walking into the office from the parking deck.  She gets short of breath easily.  +Orthopnea and occasional PND. Still does not have CPAP. Weight is down 7 lbs since last appointment. I walked her in the hall to see if she would qualify for home oxygen (she did not, oxygen saturation around 97%).   PMH: 1. Gout 2. Chronic atrial fibrillation: She failed TEE-guided DCCV in 12/20.   Prior DCCVs attempted in [place].  3. OSA: Uses CPAP.  4. COVID-19 infection: 11/20-12/20. Treated with remdesivir + dexamethasone.  5. AKI 11/20 with development of ESRD.  6. HTN 7. Chronic systolic CHF: 9/24 [facility] in [place], no significant coronary disease.  - [place] EF 40-45% with severe MR and TR.   - TEE (12/20): EF 20%, diffuse hypokinesis, moderately decreased RV systolic function, moderate-severe TR, peak RV-RA gradient 46 mmHg, moderate functional MR.  8. Hyperlipidemia  Social History   Socioeconomic History  . Marital status: Widowed    Spouse name: Not on file  . Number of children: Not on file  . Years of education: Not on file  . Highest education level: Not on file  Occupational History  . Not on file  Tobacco Use  . Smoking status: Never Smoker  . Smokeless tobacco: Never Used  Substance and Sexual Activity  . Alcohol use: Not Currently  . Drug use: Not on file  . Sexual activity: Not Currently  Other Topics Concern  . Not on file  Social History Narrative  . Not on file   Social Determinants of Health   Financial Resource Strain:   . Difficulty of Paying Living Expenses: Not on file  Food Insecurity:   . Worried About Charity fundraiser in the Last Year: Not on file  . Ran Out of Food in the Last Year: Not on file  Transportation Needs:   . Lack  of Transportation (Medical): Not on file  . Lack of Transportation (Non-Medical): Not on file  Physical Activity:   . Days of Exercise per Week: Not on file  . Minutes of Exercise per Session: Not on file  Stress:   . Feeling of Stress : Not on file  Social Connections:   . Frequency of Communication with Friends and Family: Not on file  . Frequency of Social Gatherings with Friends and Family: Not on file  . Attends Religious Services: Not on file  . Active Member of Clubs or Organizations: Not on file  . Attends Archivist Meetings: Not on file  . Marital Status: Not on file   Intimate Partner Violence:   . Fear of Current or Ex-Partner: Not on file  . Emotionally Abused: Not on file  . Physically Abused: Not on file  . Sexually Abused: Not on file   Family History  Problem Relation Age of Onset  . Seizures Father   . CAD Father   . Diabetes Sister   . Lupus Sister   . Hypertension Sister    ROS: All systems reviewed and negative except as per HPI.   Current Outpatient Medications  Medication Sig Dispense Refill  . acetaminophen (TYLENOL) 325 MG tablet Take 2 tablets (650 mg total) by mouth every 4 (four) hours as needed for headache or mild pain.    Marland Kitchen albuterol (VENTOLIN HFA) 108 (90 Base) MCG/ACT inhaler Inhale 2 puffs into the lungs every 6 (six) hours as needed for wheezing or shortness of breath. 6.7 g 1  . allopurinol (ZYLOPRIM) 300 MG tablet Take 1 tablet (300 mg total) by mouth daily. 90 tablet 1  . apixaban (ELIQUIS) 5 MG TABS tablet Take 1 tablet (5 mg total) by mouth 2 (two) times daily. 60 tablet 11  . atorvastatin (LIPITOR) 40 MG tablet Take 1 tablet (40 mg total) by mouth daily. 90 tablet 1  . metoprolol succinate (TOPROL-XL) 25 MG 24 hr tablet Take 1.5 tablets (37.5 mg total) by mouth 2 (two) times daily. 90 tablet 5  . montelukast (SINGULAIR) 10 MG tablet Take 1 tablet (10 mg total) by mouth daily. 90 tablet 1  . multivitamin (RENA-VIT) TABS tablet Take 1 tablet by mouth at bedtime. 30 tablet 0  . vitamin C (VITAMIN C) 500 MG tablet Take 1 tablet (500 mg total) by mouth daily. 30 tablet 0   No current facility-administered medications for this encounter.   BP (!) 107/53   Pulse 97   Wt 108.9 kg (240 lb)   SpO2 98%   BMI 42.51 kg/m  General: NAD Neck: JVP 10 cm, no thyromegaly or thyroid nodule.  Lungs: Clear to auscultation bilaterally with normal respiratory effort. CV: Nondisplaced PMI.  Heart itregular S1/S2, no S3/S4, 1/6 HSM LLSB.  1+ edema top knees.  No carotid bruit.  Normal pedal pulses.  Abdomen: Soft, nontender, no  hepatosplenomegaly, no distention.  Skin: Intact without lesions or rashes.  Neurologic: Alert and oriented x 3.  Psych: Normal affect. Extremities: No clubbing or cyanosis.  HEENT: Normal.   Assessment/Plan:  1. Acute on chronic systolic CHF:  Nonischemic cardiomyopathy, cath in [place] in 9/20 with no significant coronary disease.  Echo in 11/20 with EF 20-25% with dilated and severely dysfunctional RV with D-shaped interventricular septum. MR appeared moderate and TR appeared moderate. With dilated RV, V/Q scan was done and was negative. Biventricular failure. Last echo in [place] per report showed EF 40-45%. Cause of fall in EF  uncertain, could be due to atrial fibrillation with RVR (tachycardia-mediated). Myocarditis, potentially from COVID-19, is possible. It is also possible that she has a stress cardiomyopathy due to medical illness/COVID-19 infection.  She is now HD-dependent for volume control.  Despite HD, she is still volume overloaded on exam today, NYHA class  III symptoms. - Increase Toprol XL to 37.5 mg bid.  - She needs more fluid off during her HD sessions - Repeat echo at followup appt.   2. Valvular heart disease: Patient was supposed to have mitral and tricuspid valve replacements in 11/20 in [place].  TEE in 9/20 in [place] showed mod-severe MR and severe TR. On my read of the echo done here, she has moderate MR and moderate TR (both central and likely functional). TEE on 10/26/19 showed mod-severe TR and moderate MR.  Her main issue here seems to be biventricular failure and would hold off on valve procedures for the time being.   - Repeat echo to reassess at followup appt in 2 months.  3. Atrial fibrillation: Chronic per outpatient notes. Per daughter, she has had cardioversions in the past. She failed DCCV on amiodarone on 10/26/19. I think she is unlikely to go back into NSR at this point.  She is now off amiodarone.  - Increase Toprol XL to 37.5 mg bid for rate control.   - Continue apixaban 5 mg bid.  4. ESRD: Appears to be tolerating HD.  5. OSA: Patient has been diagnosed with OSA in the past but wants a nasal prongs CPAP (not using CPAP currently).    - Refer to Dr. Radford Pax for evaluation.   Followup 2 months with echo.   Melissa Adams 12/24/2019

## 2020-01-03 ENCOUNTER — Telehealth (HOSPITAL_COMMUNITY): Payer: Self-pay

## 2020-01-03 NOTE — Telephone Encounter (Signed)
HH orders signed and faxed 

## 2020-01-11 ENCOUNTER — Ambulatory Visit (HOSPITAL_COMMUNITY)
Admission: RE | Admit: 2020-01-11 | Discharge: 2020-01-11 | Disposition: A | Discharge status: Home / Self Care | Payer: Medicare HMO | Source: Ambulatory Visit | Attending: Surgery | Admitting: Surgery

## 2020-01-11 ENCOUNTER — Ambulatory Visit (INDEPENDENT_AMBULATORY_CARE_PROVIDER_SITE_OTHER): Payer: Medicare HMO | Admitting: Physician Assistant

## 2020-01-11 ENCOUNTER — Other Ambulatory Visit: Payer: Self-pay

## 2020-01-11 DIAGNOSIS — Z992 Dependence on renal dialysis: Secondary | ICD-10-CM

## 2020-01-11 DIAGNOSIS — N186 End stage renal disease: Secondary | ICD-10-CM | POA: Diagnosis not present

## 2020-01-11 NOTE — Progress Notes (Signed)
Established Dialysis Access   History of Present Illness   Melissa Adams is a 74 y.o. ([birth year]) female who presents for re-evaluation of permanent access.  She underwent left brachiocephalic fistula creation by Dr. Carlis Abbott 11/04/2019.  She is dialyzing via right IJ [facility] on a Monday Wednesday Friday schedule without complication.  Fistula was small in diameter and deep at first postoperative check and patient was brought back for reevaluation by dialysis duplex.  She denies any signs or symptoms of steal syndrome.  She is on Eliquis for atrial fibrillation.  The patient's PMH, PSH, SH, and FamHx were reviewed and are unchanged from prior visit.  Current Outpatient Medications  Medication Sig Dispense Refill  . acetaminophen (TYLENOL) 325 MG tablet Take 2 tablets (650 mg total) by mouth every 4 (four) hours as needed for headache or mild pain.    Marland Kitchen albuterol (VENTOLIN HFA) 108 (90 Base) MCG/ACT inhaler Inhale 2 puffs into the lungs every 6 (six) hours as needed for wheezing or shortness of breath. 6.7 g 1  . allopurinol (ZYLOPRIM) 300 MG tablet Take 1 tablet (300 mg total) by mouth daily. 90 tablet 1  . apixaban (ELIQUIS) 5 MG TABS tablet Take 1 tablet (5 mg total) by mouth 2 (two) times daily. 60 tablet 11  . atorvastatin (LIPITOR) 40 MG tablet Take 1 tablet (40 mg total) by mouth daily. 90 tablet 1  . metoprolol succinate (TOPROL-XL) 25 MG 24 hr tablet Take 1.5 tablets (37.5 mg total) by mouth 2 (two) times daily. 90 tablet 5  . montelukast (SINGULAIR) 10 MG tablet Take 1 tablet (10 mg total) by mouth daily. 90 tablet 1  . multivitamin (RENA-VIT) TABS tablet Take 1 tablet by mouth at bedtime. 30 tablet 0  . vitamin C (VITAMIN C) 500 MG tablet Take 1 tablet (500 mg total) by mouth daily. 30 tablet 0   No current facility-administered medications for this visit.    On ROS today: 10 system ROS negative unless otherwise noted in HPI   Physical Examination   Vitals:   01/11/20 1047    BP: 113/71  Pulse: 90  Resp: 18  Temp: 97.8 F (36.6 C)  TempSrc: Temporal  SpO2: 97%  Weight: 235 lb (106.6 kg)  Height: 5\' 3"  (1.6 m)   Body mass index is 41.63 kg/m.  General Alert, O x 3, WD, NAD  Pulmonary Sym exp, good B air movt, CTA B  Cardiac Irregularly, irregular rate and rhythm,  Vascular Vessel Right Left  Radial Palpable Palpable  Brachial Palpable Palpable  Ulnar Not palpable Not palpable    Musculo- skeletal  palpable thrill near anastomosis however difficult to feel beyond this area  Neurologic A&O; CN grossly intact     Non-invasive Vascular Imaging   left Arm Access Duplex  :   Diameters:  6 mm  Depth:  >6 mm      Medical Decision Making   Melissa Adams is a 74 y.o. female who presents with ESRD requiring hemodialysis.    Patent left brachiocephalic fistula without signs or symptoms of steal syndrome  Easily palpable thrill near anastomosis however path of fistula and upper arm is deep  Plan is for left arm AV fistula superficial cessation versus translocation by Dr. Carlis Abbott on a nondialysis day in the next few weeks Risk, benefits, and alternatives to access surgery were discussed.   The patient is aware the risks include but are not limited to: bleeding, infection, steal syndrome, nerve damage, thrombosis, failure to  mature, and need for additional procedures.   The patient agrees to proceed with the procedure.   Dagoberto Ligas PA-C [facility] [place] Office: [phone]  Clinic MD: Carlis Abbott

## 2020-01-18 ENCOUNTER — Other Ambulatory Visit (HOSPITAL_COMMUNITY)
Admission: RE | Admit: 2020-01-18 | Discharge: 2020-01-18 | Disposition: A | Discharge status: Home / Self Care | Payer: Medicare HMO | Source: Ambulatory Visit | Attending: Vascular Surgery | Admitting: Vascular Surgery

## 2020-01-18 DIAGNOSIS — Z01812 Encounter for preprocedural laboratory examination: Secondary | ICD-10-CM | POA: Diagnosis present

## 2020-01-18 DIAGNOSIS — Z20822 Contact with and (suspected) exposure to covid-19: Secondary | ICD-10-CM | POA: Diagnosis not present

## 2020-01-18 LAB — SARS CORONAVIRUS 2 (TAT 6-24 HRS): SARS Coronavirus 2: NEGATIVE

## 2020-01-19 ENCOUNTER — Other Ambulatory Visit: Payer: Self-pay

## 2020-01-19 ENCOUNTER — Encounter (HOSPITAL_COMMUNITY): Payer: Self-pay | Admitting: Vascular Surgery

## 2020-01-19 NOTE — Progress Notes (Signed)
Pt gave verbal consent for daughter, [name], to complete SDW-pre-op call. Daughter denies that pt C/O any acute cardiopulmonary issues. Daughter stated that pt is under the care of Dr. Aundra Dubin, Cardiology and [name], NP, PCP. Daughter stated that pt last dose of Eliquis was Monday morning ( 01/17/20) as instructed by surgeon. Daughter made aware to have pt stop taking Stop vitamins, fish oil and herbal medications. Do not take any NSAIDs ie: Ibuprofen, Advil, Naproxen (Aleve), Motrin, BC and Goody Powder. Daughter reminded to have pt quarantine. Daughter verbalized understanding of all pre-op instructions.  PA, Anesthesiology, asked to review pt cardiac history.

## 2020-01-20 NOTE — Anesthesia Preprocedure Evaluation (Addendum)
Anesthesia Evaluation  Patient identified by MRN, date of birth, ID band Patient awake    Reviewed: Allergy & Precautions, NPO status , Patient's Chart, lab work & pertinent test results, reviewed documented beta blocker date and time   Airway Mallampati: III  TM Distance: >3 FB Neck ROM: Full  Mouth opening: Limited Mouth Opening  Dental no notable dental hx. (+) Poor Dentition, Missing, Chipped, Dental Advisory Given,    Pulmonary shortness of breath, sleep apnea , former smoker,  COVID positive 3 months ago, now negative   Pulmonary exam normal breath sounds clear to auscultation       Cardiovascular hypertension, Pt. on home beta blockers and Pt. on medications +CHF  Normal cardiovascular exam+ dysrhythmias Atrial Fibrillation + Valvular Problems/Murmurs (mod-severe MR, mod TR)  Rhythm:Regular Rate:Normal  cath in [place] in 9/20 with no significant coronary disease  Echo in 11/20 with EF 20-25% with dilated and severely dysfunctional RV with D-shaped interventricular septum.  MR appeared moderate and TR appeared moderate. With dilated RV, V/Q scan was done and was negative.  Biventricular failure.   Neuro/Psych negative neurological ROS  negative psych ROS   GI/Hepatic negative GI ROS, Neg liver ROS,   Endo/Other  Morbid obesity (BMI 42)  Renal/GU ESRFRenal disease  negative genitourinary   Musculoskeletal  (+) Arthritis ,   Abdominal   Peds  Hematology  (+) Blood dyscrasia (on eliquis), ,   Anesthesia Other Findings   Reproductive/Obstetrics                           Anesthesia Physical Anesthesia Plan  ASA: IV  Anesthesia Plan: MAC   Post-op Pain Management:    Induction: Intravenous  PONV Risk Score and Plan: 2 and Propofol infusion and Treatment may vary due to age or medical condition  Airway Management Planned: Natural Airway  Additional Equipment:   Intra-op Plan:    Post-operative Plan:   Informed Consent: I have reviewed the patients History and Physical, chart, labs and discussed the procedure including the risks, benefits and alternatives for the proposed anesthesia with the patient or authorized representative who has indicated his/her understanding and acceptance.     Dental advisory given  Plan Discussed with: CRNA  Anesthesia Plan Comments: ( )       Anesthesia Quick Evaluation

## 2020-01-20 NOTE — Progress Notes (Signed)
Anesthesia Chart Review: Same day workup   Follows with cardiology complicated cardiac history which is summarized in the Assessment and Plan from Dr. Claris Gladden last [place] note from 12/23/19 which is copied below: 1. Acute on chronic systolic CHF: Nonischemic cardiomyopathy, cath in [place] in 9/20 with no significant coronary disease. Echo in 11/20 with EF 20-25% with dilated and severely dysfunctional RV with D-shaped interventricular septum. MR appeared moderate and TR appeared moderate. With dilated RV, V/Q scan was done and was negative. Biventricular failure. Last echo in [place] per report showed EF 40-45%. Cause of fall in EF uncertain, could be due to atrial fibrillation with RVR (tachycardia-mediated). Myocarditis, potentially from COVID-19, is possible. It is also possible that she has a stress cardiomyopathy due to medical illness/COVID-19 infection. She is now HD-dependent for volume control.  Despite HD, she is still volume overloaded on exam today, NYHA class  III symptoms. - Increase Toprol XL to 37.5 mg bid.  - She needs more fluid off during her HD sessions - Repeat echo at followup appt.   2. Valvular heart disease: Patient was supposed to have mitral and tricuspid valve replacements in 11/20 in [place]. TEE in 9/20 in [place] showed mod-severe MR and severe TR. On my read of the echo done here, she has moderate MR and moderate TR (both central and likely functional). TEE on 10/26/19 showed mod-severe TR and moderate MR. Her main issue here seems to be biventricular failure and would hold off on valve procedures for the time being.  - Repeat echo to reassess at followup appt in 2 months.  3. Atrial fibrillation: Chronic per outpatient notes. Per daughter, she has had cardioversions in the past. She failed DCCV on amiodarone on 10/26/19. I think she is unlikely to go back into NSR at this point. She is now off amiodarone. -Increase Toprol XL to 37.5 mg bid for rate control.  -  Continue apixaban 5 mg bid.  4. ESRD: Appears to be tolerating HD.  5. OSA: Patient has been diagnosed with OSA in the past but wants a nasal prongs CPAP (not using CPAP currently).    - Refer to Dr. Radford Pax for evaluation.   Pt underwent left brachiocephalic fistula creation by Dr. Carlis Abbott 11/04/2019.  She is dialyzing via right IJ [facility] on a Monday Wednesday Friday schedule without complication.  Will need DOS labs and eval.   EKG 11/10/19: Atrial fibrillation with premature ventricular or aberrantly conducted complexes. Rate 86. Cannot rule out Anterior infarct , age undetermined. No significant change since last tracing   TEE 10/26/19: 1. Left ventricular ejection fraction, by visual estimation, is 20%. The  left ventricle has severely decreased function. There is no left  ventricular hypertrophy. No LV thrombus noted.  2. Moderately dilated left ventricular internal cavity size.  3. The left ventricle demonstrates global hypokinesis.  4. Global right ventricle has moderately reduced systolic function.The  right ventricular size is moderately enlarged. No increase in right  ventricular wall thickness.  5. Right atrial size was moderately dilated.  6. Left atrial size was moderately dilated. No LA appendage thrombus  though "smoke" was noted.  7. There was moderate-severe TR. Peak RV-RA gradient 46 mmHg.  8. There was moderate mitral regurgitation, likely functional from  annular dilatation. ERO by PISA only calculated to 0.13 cm^2. There was  flattening of the pulmonary vein systolic doppler signal but not reversal.  9. The aortic valve is tricuspid. Aortic valve regurgitation is not  visualized. No evidence of aortic  valve sclerosis or stenosis.  10. Normal caliber thoracic aorta with minimal plaque.  11. The pulmonic valve was normal in structure. Pulmonic valve  regurgitation is not visualized.   Wynonia Musty [facility] Short Stay Center/Anesthesiology Phone [phone] 01/20/2020 11:17 AM c valve  regurgitation is not visualized.

## 2020-01-21 ENCOUNTER — Telehealth: Payer: Self-pay

## 2020-01-21 ENCOUNTER — Ambulatory Visit (HOSPITAL_COMMUNITY): Payer: Medicare HMO | Admitting: Physician Assistant

## 2020-01-21 ENCOUNTER — Encounter (HOSPITAL_COMMUNITY): Payer: Self-pay | Admitting: Vascular Surgery

## 2020-01-21 ENCOUNTER — Other Ambulatory Visit: Payer: Self-pay

## 2020-01-21 ENCOUNTER — Encounter (HOSPITAL_COMMUNITY)
Admission: RE | Disposition: A | Discharge status: Home / Self Care | Payer: Self-pay | Source: Home / Self Care | Attending: Vascular Surgery

## 2020-01-21 ENCOUNTER — Ambulatory Visit (HOSPITAL_COMMUNITY)
Admission: RE | Admit: 2020-01-21 | Discharge: 2020-01-21 | Disposition: A | Discharge status: Home / Self Care | Payer: Medicare HMO | Attending: Vascular Surgery | Admitting: Vascular Surgery

## 2020-01-21 DIAGNOSIS — R0602 Shortness of breath: Secondary | ICD-10-CM | POA: Diagnosis not present

## 2020-01-21 DIAGNOSIS — M1990 Unspecified osteoarthritis, unspecified site: Secondary | ICD-10-CM | POA: Diagnosis not present

## 2020-01-21 DIAGNOSIS — I509 Heart failure, unspecified: Secondary | ICD-10-CM | POA: Diagnosis not present

## 2020-01-21 DIAGNOSIS — I132 Hypertensive heart and chronic kidney disease with heart failure and with stage 5 chronic kidney disease, or end stage renal disease: Secondary | ICD-10-CM | POA: Insufficient documentation

## 2020-01-21 DIAGNOSIS — Z7901 Long term (current) use of anticoagulants: Secondary | ICD-10-CM | POA: Diagnosis not present

## 2020-01-21 DIAGNOSIS — Z992 Dependence on renal dialysis: Secondary | ICD-10-CM | POA: Insufficient documentation

## 2020-01-21 DIAGNOSIS — Z8616 Personal history of COVID-19: Secondary | ICD-10-CM | POA: Diagnosis not present

## 2020-01-21 DIAGNOSIS — X58XXXA Exposure to other specified factors, initial encounter: Secondary | ICD-10-CM | POA: Diagnosis not present

## 2020-01-21 DIAGNOSIS — T82898A Other specified complication of vascular prosthetic devices, implants and grafts, initial encounter: Secondary | ICD-10-CM | POA: Insufficient documentation

## 2020-01-21 DIAGNOSIS — Z79899 Other long term (current) drug therapy: Secondary | ICD-10-CM | POA: Insufficient documentation

## 2020-01-21 DIAGNOSIS — I4891 Unspecified atrial fibrillation: Secondary | ICD-10-CM | POA: Diagnosis not present

## 2020-01-21 DIAGNOSIS — G4730 Sleep apnea, unspecified: Secondary | ICD-10-CM | POA: Insufficient documentation

## 2020-01-21 DIAGNOSIS — Z6841 Body Mass Index (BMI) 40.0 and over, adult: Secondary | ICD-10-CM | POA: Insufficient documentation

## 2020-01-21 DIAGNOSIS — Z87891 Personal history of nicotine dependence: Secondary | ICD-10-CM | POA: Diagnosis not present

## 2020-01-21 DIAGNOSIS — N186 End stage renal disease: Secondary | ICD-10-CM | POA: Diagnosis not present

## 2020-01-21 DIAGNOSIS — N185 Chronic kidney disease, stage 5: Secondary | ICD-10-CM

## 2020-01-21 HISTORY — DX: Chronic kidney disease, unspecified: N18.9

## 2020-01-21 HISTORY — DX: Allergy, unspecified, initial encounter: T78.40XA

## 2020-01-21 HISTORY — DX: Unspecified osteoarthritis, unspecified site: M19.90

## 2020-01-21 HISTORY — PX: FISTULA SUPERFICIALIZATION: SHX6341

## 2020-01-21 HISTORY — PX: REVISION OF ARTERIOVENOUS GORETEX GRAFT: SHX6073

## 2020-01-21 LAB — POCT I-STAT, CHEM 8
BUN: 35 mg/dL — ABNORMAL HIGH (ref 8–23)
Calcium, Ion: 1.09 mmol/L — ABNORMAL LOW (ref 1.15–1.40)
Chloride: 98 mmol/L (ref 98–111)
Creatinine, Ser: 6.5 mg/dL — ABNORMAL HIGH (ref 0.44–1.00)
Glucose, Bld: 82 mg/dL (ref 70–99)
HCT: 41 % (ref 36.0–46.0)
Hemoglobin: 13.9 g/dL (ref 12.0–15.0)
Potassium: 3.7 mmol/L (ref 3.5–5.1)
Sodium: 137 mmol/L (ref 135–145)
TCO2: 28 mmol/L (ref 22–32)

## 2020-01-21 LAB — PROTIME-INR
INR: 1.2 (ref 0.8–1.2)
Prothrombin Time: 14.6 seconds (ref 11.4–15.2)

## 2020-01-21 SURGERY — FISTULA SUPERFICIALIZATION
Anesthesia: Monitor Anesthesia Care | Site: Arm Upper | Laterality: Left

## 2020-01-21 MED ORDER — SODIUM CHLORIDE 0.9 % IV SOLN: INTRAVENOUS | Status: DC

## 2020-01-21 MED ORDER — PROPOFOL 1000 MG/100ML IV EMUL
INTRAVENOUS | Status: AC
  Filled 2020-01-21: qty 100

## 2020-01-21 MED ORDER — LIDOCAINE 2% (20 MG/ML) 5 ML SYRINGE
INTRAMUSCULAR | Status: AC
  Filled 2020-01-21: qty 5

## 2020-01-21 MED ORDER — PROPOFOL 10 MG/ML IV BOLUS
INTRAVENOUS | Status: DC | PRN
  Administered 2020-01-21: 11 mg via INTRAVENOUS
  Administered 2020-01-21: 10 mg via INTRAVENOUS
  Administered 2020-01-21: 11 mg via INTRAVENOUS

## 2020-01-21 MED ORDER — PHENYLEPHRINE HCL-NACL 10-0.9 MG/250ML-% IV SOLN
INTRAVENOUS | Status: DC | PRN
  Administered 2020-01-21: 25 ug/min via INTRAVENOUS

## 2020-01-21 MED ORDER — DIPHENHYDRAMINE HCL 50 MG/ML IJ SOLN
INTRAMUSCULAR | Status: DC | PRN
  Administered 2020-01-21: 12.5 mg via INTRAVENOUS

## 2020-01-21 MED ORDER — MIDAZOLAM HCL 2 MG/2ML IJ SOLN
INTRAMUSCULAR | Status: AC
  Filled 2020-01-21: qty 2

## 2020-01-21 MED ORDER — 0.9 % SODIUM CHLORIDE (POUR BTL) OPTIME
TOPICAL | Status: DC | PRN
  Administered 2020-01-21: 1000 mL

## 2020-01-21 MED ORDER — CEFAZOLIN SODIUM-DEXTROSE 2-4 GM/100ML-% IV SOLN
2.0000 g | INTRAVENOUS | Status: AC
  Administered 2020-01-21: 2 g via INTRAVENOUS
  Filled 2020-01-21: qty 100

## 2020-01-21 MED ORDER — FENTANYL CITRATE (PF) 250 MCG/5ML IJ SOLN
INTRAMUSCULAR | Status: DC | PRN
  Administered 2020-01-21 (×2): 25 ug via INTRAVENOUS
  Administered 2020-01-21: 50 ug via INTRAVENOUS

## 2020-01-21 MED ORDER — PROPOFOL 500 MG/50ML IV EMUL
INTRAVENOUS | Status: DC | PRN
  Administered 2020-01-21: 50 ug/kg/min via INTRAVENOUS

## 2020-01-21 MED ORDER — MIDAZOLAM HCL 5 MG/5ML IJ SOLN
INTRAMUSCULAR | Status: DC | PRN
  Administered 2020-01-21: 1 mg via INTRAVENOUS

## 2020-01-21 MED ORDER — PROPOFOL 10 MG/ML IV BOLUS: INTRAVENOUS | Status: DC | PRN

## 2020-01-21 MED ORDER — CHLORHEXIDINE GLUCONATE 4 % EX LIQD: 60.0000 mL | Freq: Once | CUTANEOUS | Status: DC

## 2020-01-21 MED ORDER — LIDOCAINE HCL (PF) 1 % IJ SOLN
INTRAMUSCULAR | Status: AC
  Filled 2020-01-21: qty 30

## 2020-01-21 MED ORDER — LIDOCAINE 2% (20 MG/ML) 5 ML SYRINGE
INTRAMUSCULAR | Status: DC | PRN
  Administered 2020-01-21: 40 mg via INTRAVENOUS

## 2020-01-21 MED ORDER — LIDOCAINE HCL 1 % IJ SOLN
INTRAMUSCULAR | Status: DC | PRN
  Administered 2020-01-21: 10 mL

## 2020-01-21 MED ORDER — PROPOFOL 10 MG/ML IV BOLUS
INTRAVENOUS | Status: AC
  Filled 2020-01-21: qty 20

## 2020-01-21 MED ORDER — PHENYLEPHRINE 40 MCG/ML (10ML) SYRINGE FOR IV PUSH (FOR BLOOD PRESSURE SUPPORT)
PREFILLED_SYRINGE | INTRAVENOUS | Status: AC
  Filled 2020-01-21: qty 10

## 2020-01-21 MED ORDER — FENTANYL CITRATE (PF) 250 MCG/5ML IJ SOLN
INTRAMUSCULAR | Status: AC
  Filled 2020-01-21: qty 5

## 2020-01-21 MED ORDER — OXYCODONE-ACETAMINOPHEN 5-325 MG PO TABS: 1.0000 | ORAL_TABLET | ORAL | 0 refills | Status: DC | PRN

## 2020-01-21 MED ORDER — SODIUM CHLORIDE 0.9 % IV SOLN: INTRAVENOUS | Status: DC | PRN

## 2020-01-21 MED ORDER — SODIUM CHLORIDE 0.9 % IV SOLN
INTRAVENOUS | Status: AC
  Filled 2020-01-21: qty 1.2

## 2020-01-21 SURGICAL SUPPLY — 37 items
ARMBAND PINK RESTRICT EXTREMIT (MISCELLANEOUS) ×3 IMPLANT
CANISTER SUCT 3000ML PPV (MISCELLANEOUS) ×3 IMPLANT
CLIP VESOCCLUDE MED 6/CT (CLIP) ×6 IMPLANT
CLIP VESOCCLUDE SM WIDE 6/CT (CLIP) ×3 IMPLANT
COVER PROBE W GEL 5X96 (DRAPES) ×3 IMPLANT
COVER WAND RF STERILE (DRAPES) IMPLANT
DECANTER SPIKE VIAL GLASS SM (MISCELLANEOUS) ×3 IMPLANT
DERMABOND ADVANCED (GAUZE/BANDAGES/DRESSINGS) ×1
DERMABOND ADVANCED .7 DNX12 (GAUZE/BANDAGES/DRESSINGS) ×2 IMPLANT
ELECT REM PT RETURN 9FT ADLT (ELECTROSURGICAL) ×3
ELECTRODE REM PT RTRN 9FT ADLT (ELECTROSURGICAL) ×2 IMPLANT
GAUZE 4X4 16PLY RFD (DISPOSABLE) ×3 IMPLANT
GLOVE BIO SURGEON STRL SZ 6.5 (GLOVE) ×3 IMPLANT
GLOVE BIO SURGEON STRL SZ7.5 (GLOVE) ×3 IMPLANT
GLOVE BIOGEL PI IND STRL 7.0 (GLOVE) ×2 IMPLANT
GLOVE BIOGEL PI IND STRL 8 (GLOVE) ×2 IMPLANT
GLOVE BIOGEL PI INDICATOR 7.0 (GLOVE) ×1
GLOVE BIOGEL PI INDICATOR 8 (GLOVE) ×1
GOWN STRL REUS W/ TWL LRG LVL3 (GOWN DISPOSABLE) ×4 IMPLANT
GOWN STRL REUS W/ TWL XL LVL3 (GOWN DISPOSABLE) ×4 IMPLANT
GOWN STRL REUS W/TWL LRG LVL3 (GOWN DISPOSABLE) ×2
GOWN STRL REUS W/TWL XL LVL3 (GOWN DISPOSABLE) ×2
HEMOSTAT SPONGE AVITENE ULTRA (HEMOSTASIS) IMPLANT
KIT BASIN OR (CUSTOM PROCEDURE TRAY) ×3 IMPLANT
KIT TURNOVER KIT B (KITS) ×3 IMPLANT
NS IRRIG 1000ML POUR BTL (IV SOLUTION) ×3 IMPLANT
PACK CV ACCESS (CUSTOM PROCEDURE TRAY) ×3 IMPLANT
PAD ARMBOARD 7.5X6 YLW CONV (MISCELLANEOUS) ×6 IMPLANT
SUT MNCRL AB 4-0 PS2 18 (SUTURE) ×6 IMPLANT
SUT PROLENE 6 0 BV (SUTURE) ×3 IMPLANT
SUT PROLENE 7 0 BV 1 (SUTURE) IMPLANT
SUT VIC AB 3-0 SH 27 (SUTURE) ×2
SUT VIC AB 3-0 SH 27X BRD (SUTURE) ×4 IMPLANT
TOWEL GREEN STERILE (TOWEL DISPOSABLE) ×3 IMPLANT
TUBE CONNECTING 20X1/4 (TUBING) ×3 IMPLANT
UNDERPAD 30X30 (UNDERPADS AND DIAPERS) ×3 IMPLANT
WATER STERILE IRR 1000ML POUR (IV SOLUTION) ×3 IMPLANT

## 2020-01-21 NOTE — H&P (Signed)
History and Physical Interval Note:  01/21/2020 7:11 AM  Melissa Adams  has presented today for surgery, with the diagnosis of END STAGE RENAL DISEASE.  The various methods of treatment have been discussed with the patient and family. After consideration of risks, benefits and other options for treatment, the patient has consented to  Procedure(s): FISTULA SUPERFICIALIZATION VERSES TRANSLOCATION OF FISTULA (Left) as a surgical intervention.  The patient's history has been reviewed, patient examined, no change in status, stable for surgery.  I have reviewed the patient's chart and labs.  Questions were answered to the patient's satisfaction.    Superficialize left arm AVF - remains deep for cannulation.  Will also ligate side branches.    [name]  Established Dialysis Access   History of Present Illness   Melissa Adams is a 74 y.o. (03/16/1946) female who presents for re-evaluation of permanent access.  She underwent left brachiocephalic fistula creation by Dr. Carlis Abbott 11/04/2019.  She is dialyzing via right IJ [facility] on a Monday Wednesday Friday schedule without complication.  Fistula was small in diameter and deep at first postoperative check and patient was brought back for reevaluation by dialysis duplex.  She denies any signs or symptoms of steal syndrome.  She is on Eliquis for atrial fibrillation.  The patient's PMH, PSH, SH, and FamHx were reviewed and are unchanged from prior visit.        Current Outpatient Medications  Medication Sig Dispense Refill  . acetaminophen (TYLENOL) 325 MG tablet Take 2 tablets (650 mg total) by mouth every 4 (four) hours as needed for headache or mild pain.    Marland Kitchen albuterol (VENTOLIN HFA) 108 (90 Base) MCG/ACT inhaler Inhale 2 puffs into the lungs every 6 (six) hours as needed for wheezing or shortness of breath. 6.7 g 1  . allopurinol (ZYLOPRIM) 300 MG tablet Take 1 tablet (300 mg total) by mouth daily. 90 tablet 1  . apixaban (ELIQUIS)  5 MG TABS tablet Take 1 tablet (5 mg total) by mouth 2 (two) times daily. 60 tablet 11  . atorvastatin (LIPITOR) 40 MG tablet Take 1 tablet (40 mg total) by mouth daily. 90 tablet 1  . metoprolol succinate (TOPROL-XL) 25 MG 24 hr tablet Take 1.5 tablets (37.5 mg total) by mouth 2 (two) times daily. 90 tablet 5  . montelukast (SINGULAIR) 10 MG tablet Take 1 tablet (10 mg total) by mouth daily. 90 tablet 1  . multivitamin (RENA-VIT) TABS tablet Take 1 tablet by mouth at bedtime. 30 tablet 0  . vitamin C (VITAMIN C) 500 MG tablet Take 1 tablet (500 mg total) by mouth daily. 30 tablet 0   No current facility-administered medications for this visit.    On ROS today: 10 system ROS negative unless otherwise noted in HPI   Physical Examination      Vitals:   01/11/20 1047  BP: 113/71  Pulse: 90  Resp: 18  Temp: 97.8 F (36.6 C)  TempSrc: Temporal  SpO2: 97%  Weight: 235 lb (106.6 kg)  Height: 5\' 3"  (1.6 m)   Body mass index is 41.63 kg/m.  General Alert, O x 3, WD, NAD  Pulmonary Sym exp, good B air movt, CTA B  Cardiac Irregularly, irregular rate and rhythm,  Vascular Vessel Right Left  Radial Palpable Palpable  Brachial Palpable Palpable  Ulnar Not palpable Not palpable    Musculo- skeletal  palpable thrill near anastomosis however difficult to feel beyond this area  Neurologic A&O; CN grossly intact  Non-invasive Vascular Imaging   left Arm Access Duplex  :   Diameters:  6 mm  Depth:  >6 mm      Medical Decision Making   Melissa Adams is a 74 y.o. female who presents with ESRD requiring hemodialysis.    Patent left brachiocephalic fistula without signs or symptoms of steal syndrome  Easily palpable thrill near anastomosis however path of fistula and upper arm is deep  Plan is for left arm AV fistula superficial cessation versus translocation by Dr. Carlis Abbott on a nondialysis day in the next few weeks  Risk, benefits, and alternatives  to access surgery were discussed.    The patient is aware the risks include but are not limited to: bleeding, infection, steal syndrome, nerve damage, thrombosis, failure to mature, and need for additional procedures.    The patient agrees to proceed with the procedure.   Dagoberto Ligas PA-C [facility] [place] Office: [phone]  Clinic MD: Carlis Abbott

## 2020-01-21 NOTE — Telephone Encounter (Signed)
-----   Message from [name], MD sent at 01/21/2020  1:06 PM EST ----- Regarding: RE: Medication 1-2 every 4 hours is fine.  Thanks,   Gerald Stabs ----- Message ----- From: Kaleen Mask, LPN Sent: 2/41/1464  11:43 AM EST To: [name], MD Subject: Medication                                     Hello.  The pharmacy called regarding this patient's sig for Percocet.  Is she to take it 1-2 times q 4-6 hrs or 1 tab q 4 hrs.    Thank you,  Thurston Hole., LPN

## 2020-01-21 NOTE — Transfer of Care (Signed)
Immediate Anesthesia Transfer of Care Note  Patient: Melissa Adams  Procedure(s) Performed: FISTULA SUPERFICIALIZATION OF FISTULA (Left ) REVISION OF ARTERIOVENOUS FISTULA WITH SIDE BRANCH LIGATION (Left Arm Upper)  Patient Location: PACU  Anesthesia Type:MAC  Level of Consciousness: awake, alert  and oriented  Airway & Oxygen Therapy: Patient Spontanous Breathing and Patient connected to nasal cannula oxygen  Post-op Assessment: Report given to RN, Post -op Vital signs reviewed and stable and Patient moving all extremities X 4  Post vital signs: Reviewed and stable  Last Vitals:  Vitals Value Taken Time  BP 118/84 01/21/20 0912  Temp    Pulse 100 01/21/20 0914  Resp 18 01/21/20 0914  SpO2 100 % 01/21/20 0914  Vitals shown include unvalidated device data.  Last Pain:  Vitals:   01/21/20 0910  PainSc: (P) 0-No pain         Complications: No apparent anesthesia complications

## 2020-01-21 NOTE — Anesthesia Procedure Notes (Signed)
Procedure Name: MAC Date/Time: 01/21/2020 7:35 AM Performed by: Mariea Clonts, CRNA Pre-anesthesia Checklist: Patient identified, Emergency Drugs available, Suction available, Patient being monitored and Timeout performed Oxygen Delivery Method: Nasal cannula and Simple face mask

## 2020-01-21 NOTE — Anesthesia Postprocedure Evaluation (Signed)
Anesthesia Post Note  Patient: Melissa Adams  Procedure(s) Performed: FISTULA SUPERFICIALIZATION OF FISTULA (Left ) REVISION OF ARTERIOVENOUS FISTULA WITH SIDE BRANCH LIGATION (Left Arm Upper)     Patient location during evaluation: PACU Anesthesia Type: MAC Level of consciousness: awake and alert Pain management: pain level controlled Vital Signs Assessment: post-procedure vital signs reviewed and stable Respiratory status: spontaneous breathing, nonlabored ventilation, respiratory function stable and patient connected to nasal cannula oxygen Cardiovascular status: stable and blood pressure returned to baseline Postop Assessment: no apparent nausea or vomiting Anesthetic complications: no    Last Vitals:  Vitals:   01/21/20 0925 01/21/20 0940  BP: 133/85 122/84  Pulse: 92 (!) 45  Resp: 18 16  Temp: 36.8 C 36.7 C  SpO2: 98% 98%    Last Pain:  Vitals:   01/21/20 0940  PainSc: 0-No pain                 Jayden Kratochvil L Leela Vanbrocklin

## 2020-01-21 NOTE — Discharge Instructions (Signed)
° °  [facility]  ° °Discharge Instructions ° °AV Fistula or Graft Surgery for Dialysis Access ° °Please refer to the following instructions for your post-procedure care. Your surgeon or physician assistant will discuss any changes with you. ° °Activity ° °You may drive the day following your surgery, if you are comfortable and no longer taking prescription pain medication. Resume full activity as the soreness in your incision resolves. ° °Bathing/Showering ° °You may shower after you go home. Keep your incision dry for 48 hours. Do not soak in a bathtub, hot tub, or swim until the incision heals completely. You may not shower if you have a hemodialysis catheter. ° °Incision Care ° °Clean your incision with mild soap and water after 48 hours. Pat the area dry with a clean towel. You do not need a bandage unless otherwise instructed. Do not apply any ointments or creams to your incision. You may have skin glue on your incision. Do not peel it off. It will come off on its own in about one week. Your arm may swell a bit after surgery. To reduce swelling use pillows to elevate your arm so it is above your heart. Your doctor will tell you if you need to lightly wrap your arm with an ACE bandage. ° °Diet ° °Resume your normal diet. There are not special food restrictions following this procedure. In order to heal from your surgery, it is CRITICAL to get adequate nutrition. Your body requires vitamins, minerals, and protein. Vegetables are the best source of vitamins and minerals. Vegetables also provide the perfect balance of protein. Processed food has little nutritional value, so try to avoid this. ° °Medications ° °Resume taking all of your medications. If your incision is causing pain, you may take over-the counter pain relievers such as acetaminophen (Tylenol). If you were prescribed a stronger pain medication, please be aware these medications can cause nausea and constipation. Prevent  nausea by taking the medication with a snack or meal. Avoid constipation by drinking plenty of fluids and eating foods with high amount of fiber, such as fruits, vegetables, and grains. Do not take Tylenol if you are taking prescription pain medications. ° ° ° ° °Follow up °Your surgeon may want to see you in the office following your access surgery. If so, this will be arranged at the time of your surgery. ° °Please call us immediately for any of the following conditions: ° °Increased pain, redness, drainage (pus) from your incision site °Fever of 101 degrees or higher °Severe or worsening pain at your incision site °Hand pain or numbness. ° °Reduce your risk of vascular disease: ° °Stop smoking. If you would like help, call [facility] at 1-800-QUIT-NOW (1-800-784-8669) or [place] at 336-586-4000 ° °Manage your cholesterol °Maintain a desired weight °Control your diabetes °Keep your blood pressure down ° °Dialysis ° °It will take several weeks to several months for your new dialysis access to be ready for use. Your surgeon will determine when it is OK to use it. Your nephrologist will continue to direct your dialysis. You can continue to use your Permcath until your new access is ready for use. ° °If you have any questions, please call the office at [phone]. ° °

## 2020-01-21 NOTE — Op Note (Signed)
Date: January 21, 2020  Preoperative diagnosis: Left brachiocephalic AV fistula that remains deep and difficult to cannulate  Postoperative diagnosis: Same  Procedure: Left brachiocephalic AV fistula revision with sidebranch ligation and superficialization  Surgeon: Dr. [name], MD  Assistant: Paul Half, PA  Indication: Patient is a 74 year old female with end-stage renal disease that underwent left brachiocephalic AV fistula in December 2020.  Ultimately this has matured but remains difficult to cannulate given depth.  She presents today for superficialization and sidebranch ligation after risk benefits were discussed.  Findings: Two longitudinal skip incisions were made on the left upper arm and the cephalic vein was circumferentially mobilized with three large side branches were ligated and divided.  Ultimately I closed the subcutaneous tissue under the cephalic vein and then the fistula was elevated.  The skin was closed over the fistula.  There remains a good thrill.  Anesthesia: MAC  Details: Patient was taken to the operating room after informed consent was obtained.  Placed on operative table in supine position.  Anesthesia was induced.  Left arm was prepped draped in usual sterile fashion.  Timeout was performed to identify patient, procedure and site.  Initially sterile ultrasound was used to mark the course of the cephalic vein in the left upper arm.  Ultimately made two longitudinal skip incisions on the left upper arm and through this the cephalic vein was circumferentially mobilized with Bovie cautery.  Three large side branches were encountered and ligated between 3-0 silk ties and divided.  Ultimately once hemostasis was obtained all the incisions were washed out.  I then closed the subcutaneous tissue with interrupted 3-0 Vicryl's to elevate the fistula.  The two skin incisions were closed with 4-0 Monocryl with the fistula just under the skin.  Dermabond was  applied.  She had a good thrill upon completion taken to PACU in stable condition.  Complication: None  Condition: Stable  [name], MD [facility] [place] Office: [place]

## 2020-02-01 ENCOUNTER — Telehealth: Payer: Self-pay | Admitting: *Deleted

## 2020-02-01 DIAGNOSIS — G4730 Sleep apnea, unspecified: Secondary | ICD-10-CM

## 2020-02-01 NOTE — Telephone Encounter (Signed)
Fax came from [facility] stating patients insurance not contracted. Your provider has recommended that you have a home sleep study. Per Dr Aundra Dubin HOME sleep study ordered

## 2020-02-02 ENCOUNTER — Telehealth: Payer: Self-pay | Admitting: *Deleted

## 2020-02-02 NOTE — Telephone Encounter (Signed)
-----   Message from Freada Bergeron, [place] sent at 02/01/2020  6:41 PM EST ----- Regarding: Hannah  home sleep study

## 2020-02-02 NOTE — Telephone Encounter (Signed)
Staff message to Gae Bon ok to schedule HST. No PA is required.

## 2020-02-03 NOTE — Telephone Encounter (Signed)
Patient is aware and agreeable to Home Sleep Study through [facility]. Patient is scheduled for 5/14/21at 10:30 am  to pick up home sleep kit and meet with Respiratory therapist at [facility]. Patient is aware that if this appointment date and time does not work for them they should contact Artis Delay directly at [phone]. Patient is aware that a sleep packet will be sent from [facility] in week. Left detailed message on voicemail with date and time of SS and informed patient to call back to confirm or reschedule.

## 2020-02-14 ENCOUNTER — Telehealth (HOSPITAL_COMMUNITY): Payer: Self-pay

## 2020-02-14 NOTE — Telephone Encounter (Signed)

## 2020-02-15 ENCOUNTER — Other Ambulatory Visit: Payer: Self-pay

## 2020-02-15 ENCOUNTER — Ambulatory Visit (INDEPENDENT_AMBULATORY_CARE_PROVIDER_SITE_OTHER): Payer: Self-pay | Admitting: Physician Assistant

## 2020-02-15 VITALS — BP 101/67 | HR 72 | Temp 97.6°F | Resp 16 | Ht 63.0 in | Wt 234.6 lb

## 2020-02-15 DIAGNOSIS — N186 End stage renal disease: Secondary | ICD-10-CM

## 2020-02-15 DIAGNOSIS — Z992 Dependence on renal dialysis: Secondary | ICD-10-CM

## 2020-02-15 NOTE — Progress Notes (Signed)
  POST OPERATIVE OFFICE NOTE    CC:  F/u for surgery  HPI:  This is a 74 y.o. female who is s/p Left brachiocephalic AV fistula revision with sidebranch ligation and superficialization.  The original fistula was created 11/04/19.   She is dialyzing via right IJ [facility] on a Monday Wednesday Friday schedule without complication.  She denise pain, loss of motor or loss of sensation.  No Known Allergies  Current Outpatient Medications  Medication Sig Dispense Refill  . acetaminophen (TYLENOL) 325 MG tablet Take 2 tablets (650 mg total) by mouth every 4 (four) hours as needed for headache or mild pain.    Marland Kitchen albuterol (VENTOLIN HFA) 108 (90 Base) MCG/ACT inhaler Inhale 2 puffs into the lungs every 6 (six) hours as needed for wheezing or shortness of breath. 6.7 g 1  . allopurinol (ZYLOPRIM) 300 MG tablet Take 1 tablet (300 mg total) by mouth daily. 90 tablet 1  . apixaban (ELIQUIS) 5 MG TABS tablet Take 1 tablet (5 mg total) by mouth 2 (two) times daily. 60 tablet 11  . atorvastatin (LIPITOR) 40 MG tablet Take 1 tablet (40 mg total) by mouth daily. 90 tablet 1  . B Complex-C-Folic Acid (DIALYVITE 081) 0.8 MG TABS Take 1 tablet by mouth daily.    . cetirizine (ZYRTEC) 10 MG tablet Take 10 mg by mouth daily as needed for allergies.    . ferric citrate (AURYXIA) 1 GM 210 MG(Fe) tablet Take 210 mg by mouth 3 (three) times daily with meals.    . furosemide (LASIX) 80 MG tablet Take 80 mg by mouth in the morning and at bedtime.    . metoprolol succinate (TOPROL-XL) 25 MG 24 hr tablet Take 1.5 tablets (37.5 mg total) by mouth 2 (two) times daily. 90 tablet 5  . montelukast (SINGULAIR) 10 MG tablet Take 1 tablet (10 mg total) by mouth daily. 90 tablet 1  . Multiple Vitamin (MULTIVITAMIN WITH MINERALS) TABS tablet Take 1 tablet by mouth daily.    Marland Kitchen oxyCODONE-acetaminophen (PERCOCET) 5-325 MG tablet Take 1 tablet by mouth every 4 (four) hours as needed for severe pain. 1-2 tablets every 4-6 hours not to exceed  6 in 24 hours 20 tablet 0  . vitamin C (VITAMIN C) 500 MG tablet Take 1 tablet (500 mg total) by mouth daily. 30 tablet 0   No current facility-administered medications for this visit.     ROS:  See HPI  Physical Exam:    Incision:  Well healed Extremities:  Palpable radial pulses as well as thrill in left UE AV fistula.  Left Hand N/V/M intact Heart: irregularly irregular   Assessment/Plan:  This is a 74 y.o. female who is s/p: Revision left AV fistula  The fistula may be accessed and once the fistula is working well her [facility] may be removed.  F/U PRN    [facility] [phone]  Clinic MD:  Early

## 2020-02-28 ENCOUNTER — Encounter (HOSPITAL_COMMUNITY): Payer: Medicare HMO | Admitting: Cardiology

## 2020-02-28 ENCOUNTER — Other Ambulatory Visit (HOSPITAL_COMMUNITY): Payer: Medicare HMO

## 2020-02-28 ENCOUNTER — Telehealth (HOSPITAL_COMMUNITY): Payer: Self-pay | Admitting: Pharmacist

## 2020-02-28 NOTE — Telephone Encounter (Signed)
Patient Advocate Encounter   Received notification from [facility] that prior authorization for metoprolol succinate is required (quantity limit exception).   PA submitted on CoverMyMeds Key  [id number] Status is pending   Will continue to follow.  Audry Riles, PharmD, BCPS, BCCP, CPP Heart Failure Clinic Pharmacist [phone]

## 2020-02-29 NOTE — Telephone Encounter (Signed)
Advanced Heart Failure Patient Advocate Encounter  Prior Authorization/QL Exception for metoprolol succinate has been approved (quantity 90 for 30 days).    Effective dates: 02/29/20 through 11/24/20  Audry Riles, PharmD, BCPS, BCCP, CPP Heart Failure Clinic Pharmacist [phone]

## 2020-03-07 ENCOUNTER — Ambulatory Visit (HOSPITAL_COMMUNITY): Payer: Medicare HMO

## 2020-03-21 ENCOUNTER — Ambulatory Visit (INDEPENDENT_AMBULATORY_CARE_PROVIDER_SITE_OTHER): Payer: Medicare HMO | Admitting: Primary Care

## 2020-03-21 ENCOUNTER — Encounter (INDEPENDENT_AMBULATORY_CARE_PROVIDER_SITE_OTHER): Payer: Self-pay | Admitting: Primary Care

## 2020-03-21 ENCOUNTER — Other Ambulatory Visit: Payer: Self-pay

## 2020-03-21 DIAGNOSIS — N185 Chronic kidney disease, stage 5: Secondary | ICD-10-CM | POA: Diagnosis not present

## 2020-03-21 DIAGNOSIS — I482 Chronic atrial fibrillation, unspecified: Secondary | ICD-10-CM | POA: Diagnosis not present

## 2020-03-21 DIAGNOSIS — Z7689 Persons encountering health services in other specified circumstances: Secondary | ICD-10-CM

## 2020-03-21 DIAGNOSIS — Z23 Encounter for immunization: Secondary | ICD-10-CM | POA: Diagnosis not present

## 2020-03-21 NOTE — Patient Instructions (Signed)
Health Maintenance After Age 74 After age 74, you are at a higher risk for certain long-term diseases and infections as well as injuries from falls. Falls are a major cause of broken bones and head injuries in people who are older than age 74. Getting regular preventive care can help to keep you healthy and well. Preventive care includes getting regular testing and making lifestyle changes as recommended by your health care provider. Talk with your health care provider about:  Which screenings and tests you should have. A screening is a test that checks for a disease when you have no symptoms.  A diet and exercise plan that is right for you. What should I know about screenings and tests to prevent falls? Screening and testing are the best ways to find a health problem early. Early diagnosis and treatment give you the best chance of managing medical conditions that are common after age 74. Certain conditions and lifestyle choices may make you more likely to have a fall. Your health care provider may recommend:  Regular vision checks. Poor vision and conditions such as cataracts can make you more likely to have a fall. If you wear glasses, make sure to get your prescription updated if your vision changes.  Medicine review. Work with your health care provider to regularly review all of the medicines you are taking, including over-the-counter medicines. Ask your health care provider about any side effects that may make you more likely to have a fall. Tell your health care provider if any medicines that you take make you feel dizzy or sleepy.  Osteoporosis screening. Osteoporosis is a condition that causes the bones to get weaker. This can make the bones weak and cause them to break more easily.  Blood pressure screening. Blood pressure changes and medicines to control blood pressure can make you feel dizzy.  Strength and balance checks. Your health care provider may recommend certain tests to check your  strength and balance while standing, walking, or changing positions.  Foot health exam. Foot pain and numbness, as well as not wearing proper footwear, can make you more likely to have a fall.  Depression screening. You may be more likely to have a fall if you have a fear of falling, feel emotionally low, or feel unable to do activities that you used to do.  Alcohol use screening. Using too much alcohol can affect your balance and may make you more likely to have a fall. What actions can I take to lower my risk of falls? General instructions  Talk with your health care provider about your risks for falling. Tell your health care provider if: ? You fall. Be sure to tell your health care provider about all falls, even ones that seem minor. ? You feel dizzy, sleepy, or off-balance.  Take over-the-counter and prescription medicines only as told by your health care provider. These include any supplements.  Eat a healthy diet and maintain a healthy weight. A healthy diet includes low-fat dairy products, low-fat (lean) meats, and fiber from whole grains, beans, and lots of fruits and vegetables. Home safety  Remove any tripping hazards, such as rugs, cords, and clutter.  Install safety equipment such as grab bars in bathrooms and safety rails on stairs.  Keep rooms and walkways well-lit. Activity   Follow a regular exercise program to stay fit. This will help you maintain your balance. Ask your health care provider what types of exercise are appropriate for you.  If you need a cane or   walker, use it as recommended by your health care provider.  Wear supportive shoes that have nonskid soles. Lifestyle  Do not drink alcohol if your health care provider tells you not to drink.  If you drink alcohol, limit how much you have: ? 0-1 drink a day for women. ? 0-2 drinks a day for men.  Be aware of how much alcohol is in your drink. In the [place] one drink equals one typical bottle of beer (12  oz), one-half glass of wine (5 oz), or one shot of hard liquor (1 oz).  Do not use any products that contain nicotine or tobacco, such as cigarettes and e-cigarettes. If you need help quitting, ask your health care provider. Summary  Having a healthy lifestyle and getting preventive care can help to protect your health and wellness after age 74.  Screening and testing are the best way to find a health problem early and help you avoid having a fall. Early diagnosis and treatment give you the best chance for managing medical conditions that are more common for people who are older than age 74.  Falls are a major cause of broken bones and head injuries in people who are older than age 74. Take precautions to prevent a fall at home.  Work with your health care provider to learn what changes you can make to improve your health and wellness and to prevent falls. This information is not intended to replace advice given to you by your health care provider. Make sure you discuss any questions you have with your health care provider. Document Revised: 03/04/2019 Document Reviewed: 09/24/2017 Elsevier Patient Education  2020 Elsevier Inc.  

## 2020-03-21 NOTE — Progress Notes (Signed)
New Patient Office Visit  Subjective:  Patient ID: Melissa Adams, female    DOB: 09-11-1946  Age: 74 y.o. MRN: [id number]  CC:  Chief Complaint  Patient presents with  . Mass    on back of right leg     HPI [name] presents for is a 74 year old African American female who previously lived in [place] by herself and her daughter Chong Sicilian moved her to [place] to lived. She has a history of Atrial fibrillation followed by a cardiologist there transferred cared to Dr. [name]. She has HD on Monday/Wednesday and Friday at [facility]- nephrologist Dr. Armstead Peaks . She has never seen a nephrologist out side of the dialysis center.   Past Medical History:  Diagnosis Date  . A-fib ([place])   . Allergies   . Arthritis   . Cardiomyopathy ([place])   . CHF (congestive heart failure) ([place])   . Chronic kidney disease   . Edema   . Hypertension   . Pulmonary hypertension ([place])   . Sleep apnea   . Tricuspid regurgitation     Past Surgical History:  Procedure Laterality Date  . ABDOMINAL HYSTERECTOMY    . AV FISTULA PLACEMENT Left 11/04/2019   Procedure: Insertion Of Arteriovenous (Av) Gore-Tex Graft Arm;  Surgeon: [name], MD;  Location: [place];  Service: Vascular;  Laterality: Left;  . CARDIAC CATHETERIZATION    . CARDIOVERSION N/A 10/26/2019   Procedure: CARDIOVERSION;  Surgeon: [name], MD;  Location: [facility] ENDOSCOPY;  Service: Cardiovascular;  Laterality: N/A;  . COLONOSCOPY W/ BIOPSIES AND POLYPECTOMY    . FISTULA SUPERFICIALIZATION Left 01/21/2020   Procedure: FISTULA SUPERFICIALIZATION OF FISTULA;  Surgeon: [name], MD;  Location: [place];  Service: Vascular;  Laterality: Left;  . INSERTION OF DIALYSIS CATHETER Right 11/04/2019   Procedure: CONVERT TEMPORARY DIALYSIS CATHETER TO TUNNELED DIALYSIS CATHETER Right Internal Jugular.;  Surgeon: [name], MD;  Location: [place];  Service: Vascular;  Laterality: Right;  . MULTIPLE TOOTH  EXTRACTIONS    . REVISION OF ARTERIOVENOUS GORETEX GRAFT Left 01/21/2020   Procedure: REVISION OF ARTERIOVENOUS FISTULA WITH SIDE BRANCH LIGATION;  Surgeon: [name], MD;  Location: [place];  Service: Vascular;  Laterality: Left;  . TEE WITHOUT CARDIOVERSION N/A 10/26/2019   Procedure: TRANSESOPHAGEAL ECHOCARDIOGRAM (TEE);  Surgeon: [name], MD;  Location: [facility] ENDOSCOPY;  Service: Cardiovascular;  Laterality: N/A;    Family History  Problem Relation Age of Onset  . Seizures Father   . CAD Father   . Diabetes Sister   . Lupus Sister   . Hypertension Sister     Social History   Socioeconomic History  . Marital status: Widowed    Spouse name: Not on file  . Number of children: Not on file  . Years of education: Not on file  . Highest education level: Not on file  Occupational History  . Not on file  Tobacco Use  . Smoking status: Former Smoker    Types: Cigarettes  . Smokeless tobacco: Never Used  Substance and Sexual Activity  . Alcohol use: Not Currently  . Drug use: Not Currently  . Sexual activity: Not Currently  Other Topics Concern  . Not on file  Social History Narrative  . Not on file   Social Determinants of Health   Financial Resource Strain:   . Difficulty of Paying Living Expenses:   Food Insecurity:   . Worried About Charity fundraiser in  the Last Year:   . [place] in the Last Year:   Transportation Needs:   . Film/video editor (Medical):   Marland Kitchen Lack of Transportation (Non-Medical):   Physical Activity:   . Days of Exercise per Week:   . Minutes of Exercise per Session:   Stress:   . Feeling of Stress :   Social Connections:   . Frequency of Communication with Friends and Family:   . Frequency of Social Gatherings with Friends and Family:   . Attends Religious Services:   . Active Member of Clubs or Organizations:   . Attends Archivist Meetings:   Marland Kitchen Marital Status:   Intimate Partner Violence:   . Fear of  Current or Ex-Partner:   . Emotionally Abused:   Marland Kitchen Physically Abused:   . Sexually Abused:     ROS Review of Systems  Constitutional: Positive for fatigue.  Respiratory: Positive for shortness of breath.   Cardiovascular: Positive for leg swelling.  Gastrointestinal: Positive for abdominal distention.  All other systems reviewed and are negative.   Objective:   Today's Vitals: BP 110/77 (BP Location: Right Wrist, Patient Position: Sitting, Cuff Size: Normal)   Pulse (!) 57   Temp (!) 97.3 F (36.3 C) (Temporal)   Ht 5\' 3"  (1.6 m)   Wt 232 lb 6.4 oz (105.4 kg)   SpO2 93%   BMI 41.17 kg/m   Physical Exam Vitals reviewed.  Constitutional:      Appearance: She is obese.  HENT:     Head: Normocephalic.     Right Ear: Tympanic membrane normal.     Left Ear: Tympanic membrane normal.  Eyes:     Extraocular Movements: Extraocular movements intact.     Pupils: Pupils are equal, round, and reactive to light.  Neck:     Comments: Elevated JVD on left side . Seen by vascular today.  Cardiovascular:     Rate and Rhythm: Normal rate and regular rhythm.     Pulses: Normal pulses.     Heart sounds: Normal heart sounds.  Pulmonary:     Effort: Pulmonary effort is normal.     Breath sounds: Normal breath sounds.  Abdominal:     General: Bowel sounds are normal.  Musculoskeletal:        General: Swelling present. Normal range of motion.     Cervical back: Normal range of motion and neck supple.     Right lower leg: Edema present.     Left lower leg: Edema present.     Comments: Back of left thigh and kneed nodules - legs are swollen but not warm to touch or painful .   Skin:    General: Skin is warm and dry.  Neurological:     Mental Status: She is alert and oriented to person, place, and time.  Psychiatric:        Mood and Affect: Mood normal.        Behavior: Behavior normal.        Thought Content: Thought content normal.        Judgment: Judgment normal.      Assessment & Plan:  Solace was seen today for mass.  Diagnoses and all orders for this visit:  Need for Tdap vaccination Postpone for future awaiting records from previous provider  -     Tdap vaccine greater than or equal to 7yo IM  Chronic kidney disease, stage V ([place]) Unclear if  [facility]- nephrologist follows patients  in private practice. Called and was 5:01 and no answer will follow up. -     Ambulatory referral to Nephrology  Atrial fibrillation, chronic (HCC)   Risk factors for Afib is  to include age hypertension obesity . Followed by Dr. Aundra Dubin cardiologist   Encounter to establish care [name], NP-C will be your  (PCP) she is mastered prepared . Able to diagnosed and treatment also  answer health concern as well as continuing care of varied medical conditions, not limited by cause, organ system, or diagnosis.     Outpatient Encounter Medications as of 03/21/2020  Medication Sig  . acetaminophen (TYLENOL) 325 MG tablet Take 2 tablets (650 mg total) by mouth every 4 (four) hours as needed for headache or mild pain.  Marland Kitchen allopurinol (ZYLOPRIM) 300 MG tablet Take 1 tablet (300 mg total) by mouth daily.  Marland Kitchen apixaban (ELIQUIS) 5 MG TABS tablet Take 1 tablet (5 mg total) by mouth 2 (two) times daily.  Marland Kitchen atorvastatin (LIPITOR) 40 MG tablet Take 1 tablet (40 mg total) by mouth daily.  . B Complex-C-Folic Acid (DIALYVITE 825) 0.8 MG TABS Take 1 tablet by mouth daily.  . cetirizine (ZYRTEC) 10 MG tablet Take 10 mg by mouth daily as needed for allergies.  . ferric citrate (AURYXIA) 1 GM 210 MG(Fe) tablet Take 210 mg by mouth 3 (three) times daily with meals.  . furosemide (LASIX) 80 MG tablet Take 80 mg by mouth in the morning and at bedtime.  . metoprolol succinate (TOPROL-XL) 25 MG 24 hr tablet Take 1.5 tablets (37.5 mg total) by mouth 2 (two) times daily.  . midodrine (PROAMATINE) 10 MG tablet   . montelukast (SINGULAIR) 10 MG tablet Take 1 tablet (10 mg total) by  mouth daily.  . sevelamer carbonate (RENVELA) 800 MG tablet   . vitamin C (VITAMIN C) 500 MG tablet Take 1 tablet (500 mg total) by mouth daily.  Marland Kitchen albuterol (VENTOLIN HFA) 108 (90 Base) MCG/ACT inhaler Inhale 2 puffs into the lungs every 6 (six) hours as needed for wheezing or shortness of breath.  . Multiple Vitamin (MULTIVITAMIN WITH MINERALS) TABS tablet Take 1 tablet by mouth daily.  Marland Kitchen oxyCODONE-acetaminophen (PERCOCET) 5-325 MG tablet Take 1 tablet by mouth every 4 (four) hours as needed for severe pain. 1-2 tablets every 4-6 hours not to exceed 6 in 24 hours (Patient not taking: Reported on 03/21/2020)   No facility-administered encounter medications on file as of 03/21/2020.    Follow-up: Return if symptoms worsen or fail to improve.   [name], NP

## 2020-03-21 NOTE — Addendum Note (Signed)
Addended by: Nat Christen on: 03/21/2020 05:30 PM   Modules accepted: Orders

## 2020-04-07 ENCOUNTER — Ambulatory Visit (HOSPITAL_BASED_OUTPATIENT_CLINIC_OR_DEPARTMENT_OTHER): Payer: Medicare HMO | Attending: Cardiology | Admitting: Cardiology

## 2020-04-07 ENCOUNTER — Other Ambulatory Visit: Payer: Self-pay

## 2020-04-07 DIAGNOSIS — G4733 Obstructive sleep apnea (adult) (pediatric): Secondary | ICD-10-CM | POA: Diagnosis not present

## 2020-04-07 DIAGNOSIS — G4730 Sleep apnea, unspecified: Secondary | ICD-10-CM | POA: Diagnosis present

## 2020-04-07 DIAGNOSIS — R0902 Hypoxemia: Secondary | ICD-10-CM | POA: Insufficient documentation

## 2020-04-11 NOTE — Procedures (Signed)
    Patient Name: Melissa Adams, Melissa Adams Date: 04/07/2020 Gender: Female D.O.B: [birth year] Age (years): 47 Referring Provider: Fransico Him MD, ABSM Height (inches): 65 Interpreting Physician: Fransico Him MD, ABSM Weight (lbs): 232 RPSGT: Jacolyn Reedy BMI: 39 MRN: [id number] Neck Size: 16.00  CLINICAL INFORMATION Sleep Study Type: HST Indication for sleep study: N/A Epworth Sleepiness Score: 11  SLEEP STUDY TECHNIQUE A multi-channel overnight portable sleep study was performed. The channels recorded were: nasal airflow, thoracic respiratory movement, and oxygen saturation with a pulse oximetry. Snoring was also monitored.  MEDICATIONS Patient self administered medications include: N/A.  SLEEP ARCHITECTURE Patient was studied for 573 minutes. The sleep efficiency was 100.0 % and the patient was supine for 39.5%. The arousal index was 0.0 per hour.  RESPIRATORY PARAMETERS The overall AHI was 22.9 per hour, with a central apnea index of 0.1 per hour.  The oxygen nadir was 76% during sleep.  CARDIAC DATA Mean heart rate during sleep was 64.2 bpm.  IMPRESSIONS - Moderate obstructive sleep apnea occurred during this study (AHI = 22.9/h). - No significant central sleep apnea occurred during this study (CAI = 0.1/h). - Severe oxygen desaturation was noted during this study (Min O2 = 76%). - Patient snored 2.3% during the sleep.  DIAGNOSIS - Obstructive Sleep Apnea (327.23 [G47.33 ICD-10]) - Nocturnal Hypoxemia (327.26 [G47.36 ICD-10])  RECOMMENDATIONS - Recommend in lab CPAP titration due to severity of sleep disordered breathing and nocturnal hypoxemia. - Avoid alcohol, sedatives and other CNS depressants that may worsen sleep apnea and disrupt normal sleep architecture. - Sleep hygiene should be reviewed to assess factors that may improve sleep quality. - Weight management and regular exercise should be initiated or continued.  [Electronically signed] 04/11/2020  12:46 PM  Fransico Him MD, ABSM Diplomate, American Board of Sleep Medicine

## 2020-04-20 ENCOUNTER — Ambulatory Visit (HOSPITAL_COMMUNITY)
Admission: RE | Admit: 2020-04-20 | Discharge: 2020-04-20 | Disposition: A | Discharge status: Home / Self Care | Payer: Medicare HMO | Source: Ambulatory Visit | Attending: Cardiology | Admitting: Cardiology

## 2020-04-20 ENCOUNTER — Other Ambulatory Visit: Payer: Self-pay

## 2020-04-20 ENCOUNTER — Ambulatory Visit (HOSPITAL_BASED_OUTPATIENT_CLINIC_OR_DEPARTMENT_OTHER)
Admission: RE | Admit: 2020-04-20 | Discharge: 2020-04-20 | Disposition: A | Discharge status: Home / Self Care | Payer: Medicare HMO | Source: Ambulatory Visit | Attending: Cardiology | Admitting: Cardiology

## 2020-04-20 DIAGNOSIS — I5022 Chronic systolic (congestive) heart failure: Secondary | ICD-10-CM

## 2020-04-20 DIAGNOSIS — I132 Hypertensive heart and chronic kidney disease with heart failure and with stage 5 chronic kidney disease, or end stage renal disease: Secondary | ICD-10-CM | POA: Insufficient documentation

## 2020-04-20 DIAGNOSIS — Z8616 Personal history of COVID-19: Secondary | ICD-10-CM | POA: Diagnosis not present

## 2020-04-20 DIAGNOSIS — G4733 Obstructive sleep apnea (adult) (pediatric): Secondary | ICD-10-CM | POA: Diagnosis not present

## 2020-04-20 DIAGNOSIS — I482 Chronic atrial fibrillation, unspecified: Secondary | ICD-10-CM | POA: Diagnosis not present

## 2020-04-20 DIAGNOSIS — I340 Nonrheumatic mitral (valve) insufficiency: Secondary | ICD-10-CM

## 2020-04-20 DIAGNOSIS — I428 Other cardiomyopathies: Secondary | ICD-10-CM | POA: Diagnosis not present

## 2020-04-20 DIAGNOSIS — N186 End stage renal disease: Secondary | ICD-10-CM | POA: Insufficient documentation

## 2020-04-20 DIAGNOSIS — Z8249 Family history of ischemic heart disease and other diseases of the circulatory system: Secondary | ICD-10-CM | POA: Insufficient documentation

## 2020-04-20 DIAGNOSIS — Z992 Dependence on renal dialysis: Secondary | ICD-10-CM | POA: Insufficient documentation

## 2020-04-20 DIAGNOSIS — E785 Hyperlipidemia, unspecified: Secondary | ICD-10-CM | POA: Insufficient documentation

## 2020-04-20 DIAGNOSIS — Z7901 Long term (current) use of anticoagulants: Secondary | ICD-10-CM | POA: Diagnosis not present

## 2020-04-20 DIAGNOSIS — I5082 Biventricular heart failure: Secondary | ICD-10-CM | POA: Diagnosis not present

## 2020-04-20 DIAGNOSIS — Z87891 Personal history of nicotine dependence: Secondary | ICD-10-CM | POA: Diagnosis not present

## 2020-04-20 DIAGNOSIS — M109 Gout, unspecified: Secondary | ICD-10-CM | POA: Insufficient documentation

## 2020-04-20 DIAGNOSIS — I081 Rheumatic disorders of both mitral and tricuspid valves: Secondary | ICD-10-CM | POA: Insufficient documentation

## 2020-04-20 DIAGNOSIS — I5023 Acute on chronic systolic (congestive) heart failure: Secondary | ICD-10-CM | POA: Insufficient documentation

## 2020-04-20 DIAGNOSIS — Z79899 Other long term (current) drug therapy: Secondary | ICD-10-CM | POA: Diagnosis not present

## 2020-04-20 NOTE — Patient Instructions (Signed)
No labs drawn today.   No medication changes were made. Please continue all current medications as prescribed.  Your physician recommends that you schedule a follow-up appointment in: 4 months  At the [facility], you and your health needs are our priority. As part of our continuing mission to provide you with exceptional heart care, we have created designated Provider Care Teams. These Care Teams include your primary Cardiologist (physician) and Advanced Practice Providers (APPs- Physician Assistants and Nurse Practitioners) who all work together to provide you with the care you need, when you need it.   You may see any of the following providers on your designated Care Team at your next follow up: Marland Kitchen Dr Glori Bickers . Dr [name] . Darrick Grinder, NP . Lyda Jester, PA . Audry Riles, PharmD   Please be sure to bring in all your medications bottles to every appointment.

## 2020-04-20 NOTE — Progress Notes (Signed)
Cardiology: Dr. Aundra Dubin  Nephrology: Dr. Moshe Cipro  74 y.o. with history of chronic systolic HF, mitral valve regurgitation and tricuspid valve regurgitation was scheduled to undergo both mitral and tricuspid valve replacement surgery on 10/13/19 in [place], [place].  He had a prior cath in 9/20 with no significant coronary disease.  Also w/ h/o atrial fibrillation on chronic anticoagulation w/ Eliquis, OSA on CPAP, and HTN.  Echocardiogram on August 05, 2019 in [place] noted an EF of 40 to 45% with severe MR and TR.  She presented to the [facility] 10/11/19 with progressive SOB and wt gain and edema over the prior 2-3 weeks.  She had been staying with her daughter in [place].  She was found in the ER to have markedly elevated BNP and marked volume overload. Hs troponin mildly elevated w/ flat trend 30>>33>>27>>31. SCr 1.91 on admit. BUN 47. K 4.1. COVID-19 +.  D-dimer 12.41. started on IV heparin. V/Q scan showed no evidence of PE. She was in atrial fibrillation with RVR.  Echo was done, showing EF down to 20-25%. She was started on milrinone and diuresed. Despite this, creatinine steadily rose. She ultimately required CVVH.  Significant volume was removed.  Renal function did not recover and she was started on iHD.  TEE-guided DCCV was attempted but she did not stay in NSR.  Amiodarone was stopped as she did not maintain NSR. She was treated with remdesivir and Decadron for COVID-19.   Echo was done today and reviewed, EF 30-35%, D-shaped septum with moderately dilated RV/mildly decreased systolic function, PASP 43, moderate MR, moderate TR.   She returns for followup of CHF.  Symptoms are stable.  She can walk around her house dyspnea but she gets short of breath walking longer distances.  She can do light housework.  Weight has trended down on our scale.  No chest pain.  No lightheadedness.  Still needs CPAP titration.  She continues on HD.   PMH: 1. Gout 2. Chronic atrial fibrillation: She failed  TEE-guided DCCV in 12/20.  Prior DCCVs attempted in [place].  3. OSA 4. COVID-19 infection: 11/20-12/20. Treated with remdesivir + dexamethasone.  5. AKI 11/20 with development of ESRD.  6. HTN 7. Chronic systolic CHF: 0/16 LHC in [place], no significant coronary disease.  - [facility] EF 40-45% with severe MR and TR.   - TEE (12/20): EF 20%, diffuse hypokinesis, moderately decreased RV systolic function, moderate-severe TR, peak RV-RA gradient 46 mmHg, moderate functional MR.  - Echo (5/21): EF 30-35%, D-shaped septum with moderately dilated RV/mildly decreased systolic function, PASP 43, moderate MR, moderate TR.  8. Hyperlipidemia  Social History   Socioeconomic History  . Marital status: Widowed    Spouse name: Not on file  . Number of children: Not on file  . Years of education: Not on file  . Highest education level: Not on file  Occupational History  . Not on file  Tobacco Use  . Smoking status: Former Smoker    Types: Cigarettes  . Smokeless tobacco: Never Used  Substance and Sexual Activity  . Alcohol use: Not Currently  . Drug use: Not Currently  . Sexual activity: Not Currently  Other Topics Concern  . Not on file  Social History Narrative  . Not on file   Social Determinants of Health   Financial Resource Strain:   . Difficulty of Paying Living Expenses:   Food Insecurity:   . Worried About Charity fundraiser in the Last Year:   .  Ran Out of Food in the Last Year:   Transportation Needs:   . Film/video editor (Medical):   Marland Kitchen Lack of Transportation (Non-Medical):   Physical Activity:   . Days of Exercise per Week:   . Minutes of Exercise per Session:   Stress:   . Feeling of Stress :   Social Connections:   . Frequency of Communication with Friends and Family:   . Frequency of Social Gatherings with Friends and Family:   . Attends Religious Services:   . Active Member of Clubs or Organizations:   . Attends Archivist  Meetings:   Marland Kitchen Marital Status:   Intimate Partner Violence:   . Fear of Current or Ex-Partner:   . Emotionally Abused:   Marland Kitchen Physically Abused:   . Sexually Abused:    Family History  Problem Relation Age of Onset  . Seizures Father   . CAD Father   . Diabetes Sister   . Lupus Sister   . Hypertension Sister    ROS: All systems reviewed and negative except as per HPI.   Current Outpatient Medications  Medication Sig Dispense Refill  . acetaminophen (TYLENOL) 325 MG tablet Take 2 tablets (650 mg total) by mouth every 4 (four) hours as needed for headache or mild pain.    Marland Kitchen albuterol (VENTOLIN HFA) 108 (90 Base) MCG/ACT inhaler Inhale 2 puffs into the lungs every 6 (six) hours as needed for wheezing or shortness of breath. 6.7 g 1  . allopurinol (ZYLOPRIM) 300 MG tablet Take 1 tablet (300 mg total) by mouth daily. 90 tablet 1  . apixaban (ELIQUIS) 5 MG TABS tablet Take 1 tablet (5 mg total) by mouth 2 (two) times daily. 60 tablet 11  . atorvastatin (LIPITOR) 40 MG tablet Take 1 tablet (40 mg total) by mouth daily. 90 tablet 1  . B Complex-C-Folic Acid (DIALYVITE 981) 0.8 MG TABS Take 1 tablet by mouth daily.    . cetirizine (ZYRTEC) 10 MG tablet Take 10 mg by mouth daily as needed for allergies.    . ferric citrate (AURYXIA) 1 GM 210 MG(Fe) tablet Take 210 mg by mouth 3 (three) times daily with meals.    . furosemide (LASIX) 80 MG tablet Take 80 mg by mouth in the morning and at bedtime.    . metoprolol succinate (TOPROL-XL) 25 MG 24 hr tablet Take 1.5 tablets (37.5 mg total) by mouth 2 (two) times daily. 90 tablet 5  . midodrine (PROAMATINE) 10 MG tablet     . montelukast (SINGULAIR) 10 MG tablet Take 1 tablet (10 mg total) by mouth daily. 90 tablet 1  . Multiple Vitamin (MULTIVITAMIN WITH MINERALS) TABS tablet Take 1 tablet by mouth daily.    Marland Kitchen oxyCODONE-acetaminophen (PERCOCET) 5-325 MG tablet Take 1 tablet by mouth every 4 (four) hours as needed for severe pain. 1-2 tablets every 4-6  hours not to exceed 6 in 24 hours (Patient not taking: Reported on 03/21/2020) 20 tablet 0  . sevelamer carbonate (RENVELA) 800 MG tablet     . vitamin C (VITAMIN C) 500 MG tablet Take 1 tablet (500 mg total) by mouth daily. 30 tablet 0   No current facility-administered medications for this encounter.   There were no vitals taken for this visit. General: NAD Neck: JVP 10-12 cm, no thyromegaly or thyroid nodule.  Lungs: Clear to auscultation bilaterally with normal respiratory effort. CV: Nondisplaced PMI.  Heart regular S1/S2, no S3/S4, 2/6 HSM LLSB/apex.  1+ ankle edema.  No  carotid bruit.  Normal pedal pulses.  Abdomen: Soft, nontender, no hepatosplenomegaly, no distention.  Skin: Intact without lesions or rashes.  Neurologic: Alert and oriented x 3.  Psych: Normal affect. Extremities: No clubbing or cyanosis.  HEENT: Normal.   Assessment/Plan:  1. Acute on chronic systolic CHF:  Nonischemic cardiomyopathy, cath in [place] in 9/20 with no significant coronary disease.  Echo in 11/20 with EF 20-25% with dilated and severely dysfunctional RV with D-shaped interventricular septum. MR appeared moderate and TR appeared moderate. With dilated RV, V/Q scan was done and was negative. Biventricular failure. Last echo in [place] per report showed EF 40-45%. Cause of fall in EF uncertain, could be due to atrial fibrillation with RVR (tachycardia-mediated). Myocarditis, potentially from COVID-19, is possible. It is also possible that she has a stress cardiomyopathy due to medical illness/COVID-19 infection.  She is now HD-dependent for volume control.  She takes Lasix on non-HD days, still makes some urine.  Despite HD, she is volume overloaded on exam today, NYHA class III symptoms. - Continue Toprol XL 37.5 mg bid.  - She needs more fluid off during her HD sessions, will send note to Dr. Moshe Cipro.  2. Valvular heart disease: Patient was supposed to have mitral and tricuspid valve replacements in  11/20 in [place].  TEE in 9/20 in [place] showed mod-severe MR and severe TR. On my read of the echo done here, she has moderate MR and moderate TR (both central and likely functional). TEE on 10/26/19 showed mod-severe TR and moderate MR.  Her main issue here seems to be biventricular failure and would hold off on valve procedures for the time being.  Echo done today and reviewed showed moderate MR and moderate TR.   3. Atrial fibrillation: Chronic per outpatient notes. Per daughter, she has had cardioversions in the past. She failed DCCV on amiodarone on 10/26/19. I think she is unlikely to go back into NSR at this point.  She is now off amiodarone.  - Continue Toprol XL 37.5 mg bid for rate control.  - Continue apixaban 5 mg bid.  4. ESRD: Appears to be tolerating HD but needs more volume removal.  5. OSA: Patient has been diagnosed with OSA in the past but wants a nasal prongs CPAP (not using CPAP currently).    - Needs CPAP titration.   Followup in 4 months.   [name] 04/20/2020

## 2020-04-26 ENCOUNTER — Other Ambulatory Visit (HOSPITAL_COMMUNITY): Payer: Self-pay | Admitting: *Deleted

## 2020-04-26 DIAGNOSIS — G4730 Sleep apnea, unspecified: Secondary | ICD-10-CM

## 2020-04-27 ENCOUNTER — Telehealth: Payer: Self-pay | Admitting: *Deleted

## 2020-04-27 NOTE — Telephone Encounter (Signed)
-----   Message from Sueanne Margarita, MD sent at 04/11/2020 12:48 PM EDT ----- Please let patient know that they have sleep apnea and recommend CPAP titration. Please set up titration in the sleep lab.

## 2020-04-27 NOTE — Telephone Encounter (Addendum)
Informed patient of sleep study results and patient understanding was verbalized. Patient understands his sleep study showed they have sleep apnea and recommend CPAP titration. Please set up titration in the sleep lab.   Titration sent to sleep pool Pt needs a Friday night

## 2020-04-28 ENCOUNTER — Other Ambulatory Visit: Payer: Self-pay | Admitting: *Deleted

## 2020-04-28 DIAGNOSIS — Z992 Dependence on renal dialysis: Secondary | ICD-10-CM

## 2020-04-28 DIAGNOSIS — N186 End stage renal disease: Secondary | ICD-10-CM

## 2020-05-01 ENCOUNTER — Encounter: Payer: Self-pay | Admitting: Surgery

## 2020-05-01 ENCOUNTER — Ambulatory Visit (HOSPITAL_COMMUNITY)
Admission: RE | Admit: 2020-05-01 | Discharge: 2020-05-01 | Disposition: A | Discharge status: Home / Self Care | Payer: Medicare HMO | Source: Ambulatory Visit | Attending: Surgery | Admitting: Surgery

## 2020-05-01 ENCOUNTER — Other Ambulatory Visit: Payer: Self-pay

## 2020-05-01 ENCOUNTER — Ambulatory Visit (INDEPENDENT_AMBULATORY_CARE_PROVIDER_SITE_OTHER): Payer: Medicare HMO | Admitting: Physician Assistant

## 2020-05-01 VITALS — BP 105/62 | HR 70 | Temp 97.3°F | Resp 16 | Ht 63.0 in | Wt 232.0 lb

## 2020-05-01 DIAGNOSIS — Z992 Dependence on renal dialysis: Secondary | ICD-10-CM

## 2020-05-01 DIAGNOSIS — N186 End stage renal disease: Secondary | ICD-10-CM

## 2020-05-01 NOTE — Progress Notes (Signed)
Established Dialysis Access   History of Present Illness   Melissa Adams is a 74 y.o. (1946/01/14) female who presents for re-evaluation of permanent access.  Left brachiocephalic AV fistula was created by Dr. Carlis Abbott on 11/04/2019.  She underwent superficialization by Dr. Carlis Abbott on 01/21/2020.  Patient returns to clinic with concerns of numbness and color changes in her fingertips as well as prolonged needle hole bleeding after dialysis.  Steal study was performed today.  Patient denies any wounds on her fingertips.    Patient is on Eliquis for atrial fibrillation.  She reports bleeding from needle holes long after dialysis session has ended.  She is also allergic to adhesive tape so pressure is usually held with a Ace bandage after her treatment has been completed.  Patient states she had a fistulogram at [facility] about 2 months ago.  She believes no blockages were found and her [facility] was also removed at that time.    Current Outpatient Medications  Medication Sig Dispense Refill  . acetaminophen (TYLENOL) 325 MG tablet Take 2 tablets (650 mg total) by mouth every 4 (four) hours as needed for headache or mild pain.    Marland Kitchen albuterol (VENTOLIN HFA) 108 (90 Base) MCG/ACT inhaler Inhale 2 puffs into the lungs every 6 (six) hours as needed for wheezing or shortness of breath. 6.7 g 1  . allopurinol (ZYLOPRIM) 300 MG tablet Take 1 tablet (300 mg total) by mouth daily. 90 tablet 1  . apixaban (ELIQUIS) 5 MG TABS tablet Take 1 tablet (5 mg total) by mouth 2 (two) times daily. 60 tablet 11  . atorvastatin (LIPITOR) 40 MG tablet Take 1 tablet (40 mg total) by mouth daily. 90 tablet 1  . B Complex-C-Folic Acid (DIALYVITE 546) 0.8 MG TABS Take 1 tablet by mouth daily.    . cetirizine (ZYRTEC) 10 MG tablet Take 10 mg by mouth daily as needed for allergies.    . ferric citrate (AURYXIA) 1 GM 210 MG(Fe) tablet Take 210 mg by mouth 3 (three) times daily with meals.    . furosemide (LASIX) 80 MG tablet Take  80 mg by mouth in the morning and at bedtime.    . metoprolol succinate (TOPROL-XL) 25 MG 24 hr tablet Take 1.5 tablets (37.5 mg total) by mouth 2 (two) times daily. 90 tablet 5  . midodrine (PROAMATINE) 10 MG tablet     . montelukast (SINGULAIR) 10 MG tablet Take 1 tablet (10 mg total) by mouth daily. 90 tablet 1  . Multiple Vitamin (MULTIVITAMIN WITH MINERALS) TABS tablet Take 1 tablet by mouth daily.    Marland Kitchen oxyCODONE-acetaminophen (PERCOCET) 5-325 MG tablet Take 1 tablet by mouth every 4 (four) hours as needed for severe pain. 1-2 tablets every 4-6 hours not to exceed 6 in 24 hours 20 tablet 0  . sevelamer carbonate (RENVELA) 800 MG tablet     . vitamin C (VITAMIN C) 500 MG tablet Take 1 tablet (500 mg total) by mouth daily. 30 tablet 0   No current facility-administered medications for this visit.    REVIEW OF SYSTEMS (negative unless checked):   Cardiac:  []  Chest pain or chest pressure? []  Shortness of breath upon activity? []  Shortness of breath when lying flat? []  Irregular heart rhythm?  Vascular:  []  Pain in calf, thigh, or hip brought on by walking? []  Pain in feet at night that wakes you up from your sleep? []  Blood clot in your veins? []  Leg swelling?  Pulmonary:  []  Oxygen  at home? []  Productive cough? []  Wheezing?  Neurologic:  []  Sudden weakness in arms or legs? []  Sudden numbness in arms or legs? []  Sudden onset of difficult speaking or slurred speech? []  Temporary loss of vision in one eye? []  Problems with dizziness?  Gastrointestinal:  []  Blood in stool? []  Vomited blood?  Genitourinary:  []  Burning when urinating? []  Blood in urine?  Psychiatric:  []  Major depression  Hematologic:  []  Bleeding problems? []  Problems with blood clotting?  Dermatologic:  []  Rashes or ulcers?  Constitutional:  []  Fever or chills?  Ear/Nose/Throat:  []  Change in hearing? []  Nose bleeds? []  Sore throat?  Musculoskeletal:  []  Back pain? []  Joint pain? []   Muscle pain?   Physical Examination   Vitals:   05/01/20 0945  BP: 105/62  Pulse: 70  Resp: 16  Temp: (!) 97.3 F (36.3 C)  TempSrc: Temporal  SpO2: 98%  Weight: 232 lb (105.2 kg)  Height: 5\' 3"  (1.6 m)   Body mass index is 41.1 kg/m.  General:  WDWN in NAD; vital signs documented above Gait: Not observed HENT: WNL, normocephalic Pulmonary: normal non-labored breathing Cardiac: irregular HR Abdomen: soft, NT, no masses Skin: without rashes Vascular Exam/Pulses:  Right Left  Radial 2+ (normal) 1+ (weak)   Extremities: Palpable left radial pulse; symmetrical grip strength; soft thrill left arm fistulogram however unable to feel thrill in the proximal upper arm; no ulceration overlying AV fistula Musculoskeletal: no muscle wasting or atrophy  Neurologic: A&O X 3;  No focal weakness or paresthesias are detected Psychiatric:  The pt has Normal affect.   Non-invasive Vascular Imaging    Steal study does not show a significant difference in digital pressure with compression of AV fistula    Medical Decision Making   Melissa Adams is a 74 y.o. female who presents with ESRD requiring hemodialysis.    Patent left arm fistula with soft thrill  No concern for overlying ulcer or skin breakdown  Steal study performed in office today was negative  Patient continues to have prolonged needle hole bleeding  I am unable to see the records from [facility] however, per patient, fistulogram was negative about 2 months ago  Patient and her daughter would like [place] to repeat the fistulogram; I think this is reasonable due to the patient's ongoing symptoms  fistulogram will be scheduled on a non dialysis day in the next week or two  I also educated patient to hold pressure with her finger tip over needle holes and to avoid ACE bandage after dialysis.  We also discussed that Eliquis is likely also contributing to prolonged bleeding time   Dagoberto Ligas PA-C [facility] Office: [facility] MD: Trula Slade

## 2020-05-05 ENCOUNTER — Emergency Department (HOSPITAL_COMMUNITY)
Admission: EM | Admit: 2020-05-05 | Discharge: 2020-05-05 | Disposition: A | Discharge status: Home / Self Care | Payer: Medicare HMO | Attending: Emergency Medicine | Admitting: Emergency Medicine

## 2020-05-05 ENCOUNTER — Encounter (HOSPITAL_COMMUNITY): Payer: Self-pay

## 2020-05-05 ENCOUNTER — Other Ambulatory Visit: Payer: Self-pay

## 2020-05-05 ENCOUNTER — Emergency Department (HOSPITAL_COMMUNITY): Payer: Medicare HMO

## 2020-05-05 ENCOUNTER — Telehealth: Payer: Self-pay | Admitting: Physician Assistant

## 2020-05-05 DIAGNOSIS — Z7901 Long term (current) use of anticoagulants: Secondary | ICD-10-CM | POA: Insufficient documentation

## 2020-05-05 DIAGNOSIS — J90 Pleural effusion, not elsewhere classified: Secondary | ICD-10-CM | POA: Insufficient documentation

## 2020-05-05 DIAGNOSIS — I5023 Acute on chronic systolic (congestive) heart failure: Secondary | ICD-10-CM | POA: Insufficient documentation

## 2020-05-05 DIAGNOSIS — R0789 Other chest pain: Secondary | ICD-10-CM | POA: Diagnosis not present

## 2020-05-05 DIAGNOSIS — R42 Dizziness and giddiness: Secondary | ICD-10-CM | POA: Insufficient documentation

## 2020-05-05 DIAGNOSIS — I4891 Unspecified atrial fibrillation: Secondary | ICD-10-CM | POA: Insufficient documentation

## 2020-05-05 DIAGNOSIS — J939 Pneumothorax, unspecified: Secondary | ICD-10-CM | POA: Diagnosis not present

## 2020-05-05 DIAGNOSIS — J189 Pneumonia, unspecified organism: Secondary | ICD-10-CM

## 2020-05-05 DIAGNOSIS — Z9889 Other specified postprocedural states: Secondary | ICD-10-CM

## 2020-05-05 DIAGNOSIS — I132 Hypertensive heart and chronic kidney disease with heart failure and with stage 5 chronic kidney disease, or end stage renal disease: Secondary | ICD-10-CM | POA: Insufficient documentation

## 2020-05-05 DIAGNOSIS — Z992 Dependence on renal dialysis: Secondary | ICD-10-CM | POA: Diagnosis not present

## 2020-05-05 DIAGNOSIS — Z20822 Contact with and (suspected) exposure to covid-19: Secondary | ICD-10-CM | POA: Diagnosis not present

## 2020-05-05 DIAGNOSIS — R0602 Shortness of breath: Secondary | ICD-10-CM | POA: Diagnosis present

## 2020-05-05 DIAGNOSIS — N186 End stage renal disease: Secondary | ICD-10-CM | POA: Diagnosis not present

## 2020-05-05 LAB — BASIC METABOLIC PANEL
Anion gap: 17 — ABNORMAL HIGH (ref 5–15)
BUN: 17 mg/dL (ref 8–23)
CO2: 29 mmol/L (ref 22–32)
Calcium: 7.8 mg/dL — ABNORMAL LOW (ref 8.9–10.3)
Chloride: 92 mmol/L — ABNORMAL LOW (ref 98–111)
Creatinine, Ser: 4.02 mg/dL — ABNORMAL HIGH (ref 0.44–1.00)
GFR calc Af Amer: 12 mL/min — ABNORMAL LOW (ref >60)
GFR calc non Af Amer: 10 mL/min — ABNORMAL LOW (ref >60)
Glucose, Bld: 105 mg/dL — ABNORMAL HIGH (ref 70–99)
Potassium: 4.8 mmol/L (ref 3.5–5.1)
Sodium: 138 mmol/L (ref 135–145)

## 2020-05-05 LAB — BODY FLUID CELL COUNT WITH DIFFERENTIAL
Eos, Fluid: 0 %
Lymphs, Fluid: 34 %
Monocyte-Macrophage-Serous Fluid: 26 % — ABNORMAL LOW (ref 50–90)
Neutrophil Count, Fluid: 40 % — ABNORMAL HIGH (ref 0–25)
Total Nucleated Cell Count, Fluid: 899 cu mm (ref 0–1000)

## 2020-05-05 LAB — HEPATIC FUNCTION PANEL
ALT: 35 U/L (ref 0–44)
AST: 51 U/L — ABNORMAL HIGH (ref 15–41)
Albumin: 3.7 g/dL (ref 3.5–5.0)
Alkaline Phosphatase: 216 U/L — ABNORMAL HIGH (ref 38–126)
Bilirubin, Direct: 1.1 mg/dL — ABNORMAL HIGH (ref 0.0–0.2)
Indirect Bilirubin: 1.1 mg/dL — ABNORMAL HIGH (ref 0.3–0.9)
Total Bilirubin: 2.2 mg/dL — ABNORMAL HIGH (ref 0.3–1.2)
Total Protein: 7.3 g/dL (ref 6.5–8.1)

## 2020-05-05 LAB — CBC
HCT: 38.6 % (ref 36.0–46.0)
Hemoglobin: 12.1 g/dL (ref 12.0–15.0)
MCH: 31.9 pg (ref 26.0–34.0)
MCHC: 31.3 g/dL (ref 30.0–36.0)
MCV: 101.8 fL — ABNORMAL HIGH (ref 80.0–100.0)
Platelets: 152 10*3/uL (ref 150–400)
RBC: 3.79 MIL/uL — ABNORMAL LOW (ref 3.87–5.11)
RDW: 18 % — ABNORMAL HIGH (ref 11.5–15.5)
WBC: 6 10*3/uL (ref 4.0–10.5)
nRBC: 0 % (ref 0.0–0.2)

## 2020-05-05 LAB — LACTATE DEHYDROGENASE, PLEURAL OR PERITONEAL FLUID: LD, Fluid: 137 U/L — ABNORMAL HIGH (ref 3–23)

## 2020-05-05 LAB — LIPASE, BLOOD: Lipase: 52 U/L — ABNORMAL HIGH (ref 11–51)

## 2020-05-05 LAB — GLUCOSE, PLEURAL OR PERITONEAL FLUID: Glucose, Fluid: 98 mg/dL

## 2020-05-05 LAB — GRAM STAIN

## 2020-05-05 LAB — TROPONIN I (HIGH SENSITIVITY)
Troponin I (High Sensitivity): 16 ng/L (ref <18)
Troponin I (High Sensitivity): 18 ng/L — ABNORMAL HIGH (ref <18)

## 2020-05-05 LAB — BRAIN NATRIURETIC PEPTIDE: B Natriuretic Peptide: 3410 pg/mL — ABNORMAL HIGH (ref 0.0–100.0)

## 2020-05-05 LAB — PROTEIN, PLEURAL OR PERITONEAL FLUID: Total protein, fluid: 3.8 g/dL

## 2020-05-05 LAB — SARS CORONAVIRUS 2 BY RT PCR (HOSPITAL ORDER, PERFORMED IN ~~LOC~~ HOSPITAL LAB): SARS Coronavirus 2: NEGATIVE

## 2020-05-05 MED ORDER — LIDOCAINE HCL 1 % IJ SOLN
INTRAMUSCULAR | Status: AC
  Filled 2020-05-05: qty 20

## 2020-05-05 MED ORDER — SODIUM CHLORIDE 0.9% FLUSH: 3.0000 mL | Freq: Once | INTRAVENOUS | Status: DC

## 2020-05-05 MED ORDER — DOXYCYCLINE HYCLATE 100 MG PO CAPS: 100.0000 mg | ORAL_CAPSULE | Freq: Two times a day (BID) | ORAL | 0 refills | Status: AC

## 2020-05-05 NOTE — Telephone Encounter (Signed)
Spoke with the patient's daughter, the patient is currently in dialysis however BP was quite low even prior to dialysis. Per dialysis center staff, they did not think she was significantly volume overloaded. They recommended to transfer her to ED, however she refused. The daughter is thinking of taking the patient to the ED after dialysis which I also agree that she needs to be seen in the ED given persistent symptom.

## 2020-05-05 NOTE — Procedures (Addendum)
Ultrasound-guided diagnostic and therapeutic right sided  thoracentesis performed yielding 1.12 liters of serosanguinous colored fluid. No immediate complications.   Diagnostic fluid was sent to the lab for further analysis. Follow-up chest x-ray pending. EBL is < 2 ml.       Per follow up chest xray patient has a small right base pneumothorax recommend 2 hour follow up xray or sooner if patient is symptomatic. This was communicated directly to the ED provider.

## 2020-05-05 NOTE — Progress Notes (Signed)
Nephrology Note  59F ESRD on HD, HFrEF , MVR, A fib on anticoag, OSA on CPAP, HTN, h/o COVID 09/2019 who was sent from dialysis today for CP and dyspnea.  She completed 3/4 of her treatment.  At [facility] ED she is found to have a significant R pleural effusion on CXR without pulmonary edema.  Afebrile, BP 100/60-70s, desaturates into 80s with exertion.   Exam shows somewhat uncomfortable particularly with deep breath, RRR, no rub, Dec BS R base without rales + coughing, + LE edema R>L (chronic per pt), L AVF +t/b with dressing from dialysis intact.  Labs showing normal K, bicarb, BUN 17.    I was asked to provide dialysis to remove the pleural effusion, unfortunately it's not possible.  She'll need thoracentesis (therapeutic + diagnostic).    I will follow along but I think she can remain at [facility] for the thoracentesis.  If she's feeling improved post procedure and ED provider wishes to discharge that's ok with me.  If she's admitted/obs I will look at info tomorrow but I strongly doubt she'll need dialysis over the weekend.  She may return to her usual dialysis Monday.   Of note she's been having some prolonged bleeding from LUE AVF recently and has f/u with [facility] already arranged.  We left her dressing on as she just completed dialysis.  There was no active bleeding in the ED today.   Call me with questions.

## 2020-05-05 NOTE — ED Provider Notes (Signed)
[facility] DEPT Provider Note   CSN: [id number] Arrival date & time: 05/05/20  1047     History Chief Complaint  Patient presents with  . Dizziness  . Shortness of Breath  . Chest Pain    Melissa Adams is a 74 y.o. female with past medical history of atrial fibrillation on Eliquis, CKD ESRD M/W/F, CHF, HTN, and HLD presents to the ED from dialysis with a 2-day history of chest pain, lightheadedness, and shortness of breath symptoms. Patient is accompanied by her daughter who states that she has been becoming significantly short of breath with any form of exertion. She is also been sleeping in a chair upright the past couple of days. Evidently she was only able to complete 75% of her dialysis the day before her fatigue and shortness of breath symptoms prompted her to end early and come to ED for evaluation. She denies any recent illness, fevers or chills, abdominal pain, diminished appetite, nausea or vomiting, diaphoresis, urinary symptoms or changes in bowel habits, cough, history of clots, unilateral extremity swelling, or other symptoms. She has been taking her Xarelto, as directed.  HPI     Past Medical History:  Diagnosis Date  . A-fib ([place])   . Allergies   . Arthritis   . Cardiomyopathy ([place])   . CHF (congestive heart failure) ([place])   . Chronic kidney disease   . Edema   . Hypertension   . Pulmonary hypertension ([place])   . Sleep apnea   . Tricuspid regurgitation     Patient Active Problem List   Diagnosis Date Noted  . ESRD on dialysis ([place]) 01/11/2020  . Acute on chronic systolic heart failure ([place])   . Pressure injury of skin 10/22/2019  . Dyspnea   . COVID-19 virus infection   . CHF exacerbation ([place]) 10/12/2019  . Acute CHF (congestive heart failure) ([place]) 10/11/2019  . Atrial fibrillation, chronic ([place]) 10/11/2019  . Essential hypertension 10/11/2019  . Sleep apnea 10/11/2019  . AKI (acute kidney injury) ([place]) 10/11/2019  .  Gout 10/11/2019  . Normocytic anemia 10/11/2019    Past Surgical History:  Procedure Laterality Date  . ABDOMINAL HYSTERECTOMY    . AV FISTULA PLACEMENT Left 11/04/2019   Procedure: Insertion Of Arteriovenous (Av) Gore-Tex Graft Arm;  Surgeon: [name], MD;  Location: [facility];  Service: Vascular;  Laterality: Left;  . CARDIAC CATHETERIZATION    . CARDIOVERSION N/A 10/26/2019   Procedure: CARDIOVERSION;  Surgeon: [name], MD;  Location[facility] ENDOSCOPY;  Service: Cardiovascular;  Laterality: N/A;  . COLONOSCOPY W/ BIOPSIES AND POLYPECTOMY    . FISTULA SUPERFICIALIZATION Left 01/21/2020   Procedure: FISTULA SUPERFICIALIZATION OF FISTULA;  Surgeon: [name], MD;  Location: [facility];  Service: Vascular;  Laterality: Left;  . INSERTION OF DIALYSIS CATHETER Right 11/04/2019   Procedure: CONVERT TEMPORARY DIALYSIS CATHETER TO TUNNELED DIALYSIS CATHETER Right Internal Jugular.;  Surgeon: [name], MD;  Location: [facility];  Service: Vascular;  Laterality: Right;  . MULTIPLE TOOTH EXTRACTIONS    . REVISION OF ARTERIOVENOUS GORETEX GRAFT Left 01/21/2020   Procedure: REVISION OF ARTERIOVENOUS FISTULA WITH SIDE BRANCH LIGATION;  Surgeon: [name], MD;  Location: [facility];  Service: Vascular;  Laterality: Left;  . TEE WITHOUT CARDIOVERSION N/A 10/26/2019   Procedure: TRANSESOPHAGEAL ECHOCARDIOGRAM (TEE);  Surgeon: [name], MD;  Location: [facility] ENDOSCOPY;  Service: Cardiovascular;  Laterality: N/A;     OB History   No obstetric history on file.  Family History  Problem Relation Age of Onset  . Seizures Father   . CAD Father   . Diabetes Sister   . Lupus Sister   . Hypertension Sister     Social History   Tobacco Use  . Smoking status: Former Smoker    Types: Cigarettes  . Smokeless tobacco: Never Used  Vaping Use  . Vaping Use: Never used  Substance Use Topics  . Alcohol use: Not Currently  . Drug use: Not Currently    Home  Medications Prior to Admission medications   Medication Sig Start Date End Date Taking? Authorizing Provider  acetaminophen (TYLENOL) 325 MG tablet Take 2 tablets (650 mg total) by mouth every 4 (four) hours as needed for headache or mild pain. 11/05/19  Yes Buriev, Arie Sabina, MD  albuterol (VENTOLIN HFA) 108 (90 Base) MCG/ACT inhaler Inhale 2 puffs into the lungs every 6 (six) hours as needed for wheezing or shortness of breath. 11/30/19  Yes [name], NP  allopurinol (ZYLOPRIM) 300 MG tablet Take 1 tablet (300 mg total) by mouth daily. 11/30/19  Yes [name], NP  apixaban (ELIQUIS) 5 MG TABS tablet Take 1 tablet (5 mg total) by mouth 2 (two) times daily. 12/15/19  Yes [name], MD  atorvastatin (LIPITOR) 40 MG tablet Take 1 tablet (40 mg total) by mouth daily. 11/30/19  Yes [name], NP  B Complex-C-Folic Acid (DIALYVITE 001) 0.8 MG TABS Take 1 tablet by mouth daily.   Yes [provider]  cetirizine (ZYRTEC) 10 MG tablet Take 10 mg by mouth daily as needed for allergies.   Yes [provider]  cinacalcet (SENSIPAR) 30 MG tablet Take 30 mg by mouth daily with supper.  04/18/20  Yes [provider]  ferric citrate (AURYXIA) 1 GM 210 MG(Fe) tablet Take 210 mg by mouth 3 (three) times daily with meals.   Yes [provider]  furosemide (LASIX) 80 MG tablet Take 80 mg by mouth in the morning and at bedtime.   Yes [provider]  lidocaine-prilocaine (EMLA) cream Apply 1 application topically as needed (dialysis).  04/07/20  Yes [provider]  metoprolol succinate (TOPROL-XL) 25 MG 24 hr tablet Take 1.5 tablets (37.5 mg total) by mouth 2 (two) times daily. Patient taking differently: Take 37.5 mg by mouth in the morning and at bedtime.  12/23/19  Yes [name], MD  midodrine (PROAMATINE) 10 MG tablet Take 10 mg by mouth daily.  02/10/20  Yes [provider]  montelukast (SINGULAIR) 10 MG tablet Take 1  tablet (10 mg total) by mouth daily. 11/30/19  Yes [name], NP  Multiple Vitamin (MULTIVITAMIN WITH MINERALS) TABS tablet Take 1 tablet by mouth at bedtime.    Yes [provider]  oxyCODONE-acetaminophen (PERCOCET) 5-325 MG tablet Take 1 tablet by mouth every 4 (four) hours as needed for severe pain. 1-2 tablets every 4-6 hours not to exceed 6 in 24 hours 01/21/20 01/20/21 Yes Baglia, Corrina, PA-C  sevelamer carbonate (RENVELA) 800 MG tablet Take 1,600 mg by mouth 3 (three) times daily with meals.  01/29/20  Yes [provider]  traMADol (ULTRAM) 50 MG tablet Take 50 mg by mouth every 6 (six) hours as needed for moderate pain or severe pain.   Yes [provider]  vitamin C (VITAMIN C) 500 MG tablet Take 1 tablet (500 mg total) by mouth daily. 11/06/19  Yes Kinnie Feil, MD    Allergies  Black walnut flavor and Shellfish allergy  Review of Systems   Review of Systems  All other systems reviewed and are negative.   Physical Exam Updated Vital Signs BP 107/66   Pulse 91   Temp 97.8 F (36.6 C) (Oral)   Resp (!) 30   Ht 5\' 3"  (1.6 m)   Wt 105 kg   SpO2 98%   BMI 41.01 kg/m   Physical Exam Vitals and nursing note reviewed. Exam conducted with a chaperone present.  Constitutional:      Appearance: She is ill-appearing.  HENT:     Head: Normocephalic and atraumatic.  Eyes:     General: No scleral icterus.    Conjunctiva/sclera: Conjunctivae normal.  Cardiovascular:     Rate and Rhythm: Normal rate. Rhythm irregular.     Pulses: Normal pulses.  Pulmonary:     Comments: Mildly increased work of breathing. Diminished breath sounds, particularly on right side. Symmetric chest rise. Abdominal:     Comments: Mildly distended. Soft. Mild TTP in epigastrium. No TTP elsewhere. No overlying skin changes.  Skin:    General: Skin is dry.     Capillary Refill: Capillary refill takes less than 2 seconds.  Neurological:     Mental Status: She is  alert and oriented to person, place, and time.     GCS: GCS eye subscore is 4. GCS verbal subscore is 5. GCS motor subscore is 6.  Psychiatric:        Mood and Affect: Mood normal.        Behavior: Behavior normal.        Thought Content: Thought content normal.     ED Results / Procedures / Treatments   Labs (all labs ordered are listed, but only abnormal results are displayed) Labs Reviewed  BASIC METABOLIC PANEL - Abnormal; Notable for the following components:      Result Value   Chloride 92 (*)    Glucose, Bld 105 (*)    Creatinine, Ser 4.02 (*)    Calcium 7.8 (*)    GFR calc non Af Amer 10 (*)    GFR calc Af Amer 12 (*)    Anion gap 17 (*)    All other components within normal limits  CBC - Abnormal; Notable for the following components:   RBC 3.79 (*)    MCV 101.8 (*)    RDW 18.0 (*)    All other components within normal limits  BRAIN NATRIURETIC PEPTIDE - Abnormal; Notable for the following components:   B Natriuretic Peptide 3,410.0 (*)    All other components within normal limits  HEPATIC FUNCTION PANEL - Abnormal; Notable for the following components:   AST 51 (*)    Alkaline Phosphatase 216 (*)    Total Bilirubin 2.2 (*)    Bilirubin, Direct 1.1 (*)    Indirect Bilirubin 1.1 (*)    All other components within normal limits  LIPASE, BLOOD - Abnormal; Notable for the following components:   Lipase 52 (*)    All other components within normal limits  TROPONIN I (HIGH SENSITIVITY) - Abnormal; Notable for the following components:   Troponin I (High Sensitivity) 18 (*)    All other components within normal limits  SARS CORONAVIRUS 2 BY RT PCR [facility] ORDER, [place] LAB)  TROPONIN I (HIGH SENSITIVITY)    EKG EKG Interpretation  Date/Time:  Friday May 05 2020 11:54:25 EDT Ventricular Rate:  87 PR Interval:    QRS Duration: 111  QT Interval:  427 QTC Calculation: 471 R Axis:   -54 Text Interpretation: Atrial fibrillation Paired  ventricular premature complexes S1,S2,S3 pattern Nonspecific T abnormalities, lateral leads no acute changes from prior Confirmed by Madalyn Rob [phone]) on 05/05/2020 12:35:14 PM   Radiology DG Chest 2 View  Result Date: 05/05/2020 CLINICAL DATA:  Chest pain, dizziness and shortness of breath for the past 3 days. The patient had dialysis this morning. EXAM: CHEST - 2 VIEW COMPARISON:  11/04/2019 FINDINGS: The previously demonstrated right jugular catheter has been removed. Grossly stable enlarged cardiac silhouette. Interval moderate-sized right pleural effusion and adjacent mild right basilar airspace opacity. Mild peribronchial thickening. Clear left lung. Normal pulmonary vascularity. Aortic arch calcifications. Thoracic spine degenerative changes. Diffuse osteopenia. IMPRESSION: Interval moderate-sized right pleural effusion and adjacent mild right basilar atelectasis or pneumonia. Electronically Signed   By: Claudie Revering M.D.   On: 05/05/2020 13:58    Procedures Procedures (including critical care time)  Medications Ordered in ED Medications  lidocaine (XYLOCAINE) 1 % (with pres) injection (has no administration in time range)    ED Course  I have reviewed the triage vital signs and the nursing notes.  Pertinent labs & imaging results that were available during my care of the patient were reviewed by me and considered in my medical decision making (see chart for details).  Clinical Course as of May 05 1533  Fri May 05, 2020  1354 Spoke with Dr. Johnney Ou who states that her pleural effusion is not able to be dialyzed off and that she would likely require IR for pleurocentesis.  Will consult IR.   [GG]  [id number] Spoke with IR who agrees that she would benefit from pleurocentesis.  Will place order and he states that they will take care of it.    [GG]    Clinical Course User Index [GG] Corena Herter, PA-C   MDM Rules/Calculators/A&P                          PACS is down, but we  spoke with radiology and evidently she has a large right-sided pleural effusion.  Spoke with Dr. Johnney Ou who states that her pleural effusion is not able to be dialyzed off and that she would likely require IR for pleurocentesis.  Will consult IR.    EKG: Atrial fibrillation and nonspecific T wave changes.  No significant changes from prior tracing.  Labs: Troponin: 16 --> 18 Lipase: 52 BNP: 3410 BMP: ESRD compatible with baseline Hepatic function panel: Mild elevated AST to 51.  Elevated alkaline phosphatase to 216.  Elevated total bilirubin to 2.2.  Equal disparity between direct and indirect. CBC: Unremarkable  Imaging: DG chest is personally reviewed and demonstrates an interval moderate-sized pleural effusion adjacent mild bibasilar atelectasis versus pneumonia.   While patient endorses shortness of breath symptoms, she denies any cough, fevers, or chills.  No leukocytosis on CBC.  Lower suspicion for infectious process and do not feel as though antibiotics are immediately warranted.  Note per RN when ambulating with assistance: Pt ambulated around room and SpO2 dropped to 92% and RR went up to 35. Pt reports feeling more SHOB and mild dizziness while ambulating.  Spoke with IR who agrees that she would benefit from pleurocentesis.  Will place order and he states that they will take care of it.  Once again spoke with IR who states that they will be able to see her this afternoon.  Plan is for  reassessment subsequent to IR thoracentesis to determine whether or not patient is feeling improved from shortness of breath standpoint.  If she can ambulate without increased work of breathing and lightheadedness and she feels prepared for discharge, then I feel as though it is reasonable.  However, if she continues to endorse exertional shortness of breath symptoms, consider admission for observation and perhaps consult to her cardiologist Dr. Aundra Dubin.   Final Clinical Impression(s) / ED  Diagnoses Final diagnoses:  Pleural effusion    Rx / DC Orders ED Discharge Orders    None       Corena Herter, PA-C 05/05/20 1534    Lucrezia Starch, MD 05/12/20 1737

## 2020-05-05 NOTE — ED Notes (Signed)
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

## 2020-05-05 NOTE — ED Notes (Addendum)
Pt ambulated around room and SpO2 was around 96-97% and pt reports not feeling as SHOB as earlier when ambulating. PA made aware.

## 2020-05-05 NOTE — ED Notes (Signed)
Pt transported to US

## 2020-05-05 NOTE — ED Triage Notes (Addendum)
Patient c/o dizziness, chest pain, and SOB x 3 days. Patient had dialysis this AM.

## 2020-05-05 NOTE — Discharge Instructions (Addendum)
Please go to the urgent care tomorrow to get a repeat chest x-ray to follow-up on your right sided pneumothorax (collapsed lung).  Please also follow-up with your cardiologist.  Please take the antibiotic as prescribed for your pneumonia.  Make sure to finish the course of antibiotics in order to prevent resistance.  Return to the ER at any point if you have any worsening or concerning symptoms which include shortness of breath, wheezing, worsening cough, development of fever, chest pain, etc.

## 2020-05-05 NOTE — ED Notes (Signed)
Pt ambulated around room and SpO2 dropped to 92% and RR went up to 35. Pt reports feeling more SHOB and mild dizziness while ambulating.

## 2020-05-05 NOTE — ED Provider Notes (Signed)
Patient received at signout from [facility].  Please see his note for full HPI.  74 year old female with a history of CHF last echo in May 2021 with an EF of 30 to 35%, ESRD on hemodialysis MWF, chronic A. fib on Eliquis presented with 2-day history of chest pain, lightheadedness, shortness of breath.  EKG showed chronic A. fib with no significant changes.  Labs showed small increase in troponin from 16-18, BNP of 3410 which was only mildly elevated from her baseline, BNP appropriate for an ESRD, CBC without leukocytosis.  Most notably with chest x-ray with moderate sized right-sided pleural effusion.  Previous provider had consulted Dr. Johnney Ou who states that she would not be able to have this dialyzed off and required pleurocentesis.  Received patient at signout, pending thoracentesis with plan to reevaluate patient's respiratory status after.  Thoracentesis yielded 1.12 L of serosanguineous colored fluid.  Follow-up chest x-ray showed a small right-sided pneumothorax.  Gram stain, LDH without evidence of infection.  Culture pending.  After thoracentesis, on my reevaluation, patient noted significant improvement in her shortness of breath.  She was ambulated by nursing staff and remained at 96% room air and RR remained stable.  Monitored closely in the ER for an additional 2 hours, repeat chest x-ray showed stable small right-sided pneumothorax.  Patient has remained hemodynamically stable in the ED, and is requesting to go home.  I discussed the case with Dr. Ashok Cordia, she is stable for discharge with a repeat chest x-ray tomorrow.  I encouraged her to go to urgent care or return to the ED if she has any worsening symptoms which included worsening shortness of breath, fever, chills, chest pain.  Chest x-ray also noted a right sided pneumonia, will DC with 5-day course of doxycycline for this.  I encouraged her to follow-up with her cardiologist Dr. Aundra Dubin next week, as well as her PCP.  All the patient's  questions have been answered to her satisfaction, she voices understanding, and is agreeable to this plan.  Daughter at bedside is also agreeable.  Very strict return precautions given.  At this stage in the ED course, the patient has been adequately screened and is stable for discharge.    Physical Exam  BP 111/61   Pulse 94   Temp 97.8 F (36.6 C) (Oral)   Resp 14   Ht 5\' 3"  (1.6 m)   Wt 105 kg   SpO2 96%   BMI 41.01 kg/m   Physical Exam Vitals reviewed.  Constitutional:      General: She is not in acute distress.    Appearance: Normal appearance. She is well-developed. She is not ill-appearing or diaphoretic.  HENT:     Head: Normocephalic and atraumatic.  Eyes:     General:        Right eye: No discharge.        Left eye: No discharge.     Extraocular Movements: Extraocular movements intact.     Conjunctiva/sclera: Conjunctivae normal.     Pupils: Pupils are equal, round, and reactive to light.  Cardiovascular:     Rate and Rhythm: Normal rate. Rhythm irregular.  Pulmonary:     Effort: Pulmonary effort is normal. No tachypnea or respiratory distress.     Breath sounds: Normal breath sounds. No decreased breath sounds.     Comments: No evidence of increased work of breathing.  Lung sounds clear. Chest:     Chest wall: No tenderness.  Abdominal:     Palpations: Abdomen is  soft.     Tenderness: There is no abdominal tenderness.  Musculoskeletal:        General: No swelling. Normal range of motion.     Cervical back: Normal range of motion.     Right lower leg: No tenderness. No edema.     Left lower leg: No edema.     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     Capillary Refill: Capillary refill takes less than 2 seconds.  Neurological:     General: No focal deficit present.     Mental Status: She is alert and oriented to person, place, and time.  Psychiatric:        Mood and Affect: Mood  normal.        Behavior: Behavior normal.     ED Course/Procedures   Clinical Course as of May 05 2020  Fri May 05, 2020  1354 Spoke with Dr. Johnney Ou who states that her pleural effusion is not able to be dialyzed off and that she would likely require IR for pleurocentesis.  Will consult IR.   [GG]  [id number] Spoke with IR who agrees that she would benefit from pleurocentesis.  Will place order and he states that they will take care of it.    [GG]  1655 Received call from radiology.  Patient has a small pneumothorax, will monitor her for 2 hours and repeat chest x-ray.   [MB]    Clinical Course User Index [GG] Corena Herter, PA-C [MB] Garald Balding, PA-C    Procedures  MDM        Lyndel Safe 05/05/20 2021    Lajean Saver, MD 05/07/20 Valerie Roys

## 2020-05-06 ENCOUNTER — Ambulatory Visit (INDEPENDENT_AMBULATORY_CARE_PROVIDER_SITE_OTHER): Payer: Medicare HMO

## 2020-05-06 ENCOUNTER — Ambulatory Visit (HOSPITAL_COMMUNITY)
Admission: EM | Admit: 2020-05-06 | Discharge: 2020-05-06 | Disposition: A | Discharge status: Home / Self Care | Payer: Medicare HMO | Attending: Emergency Medicine | Admitting: Emergency Medicine

## 2020-05-06 ENCOUNTER — Encounter (HOSPITAL_COMMUNITY): Payer: Self-pay | Admitting: *Deleted

## 2020-05-06 ENCOUNTER — Other Ambulatory Visit: Payer: Self-pay

## 2020-05-06 DIAGNOSIS — J90 Pleural effusion, not elsewhere classified: Secondary | ICD-10-CM

## 2020-05-06 DIAGNOSIS — J189 Pneumonia, unspecified organism: Secondary | ICD-10-CM

## 2020-05-06 HISTORY — DX: Personal history of other medical treatment: Z92.89

## 2020-05-06 NOTE — ED Triage Notes (Signed)
Pt had thoracentesis performed yesterday; was told to f/u @ [place] or ED today for f/u CXR.  Pt has not yet started doxycycline as prescribed yesterday. States SOB feeling better than yesterday.  Has dyspnea on exertion.

## 2020-05-06 NOTE — Discharge Instructions (Signed)
Xray appears stable Begin doxycycline Follow up with primary care and cardiology

## 2020-05-07 NOTE — ED Provider Notes (Signed)
[place]    CSN: [id number] Arrival date & time: 05/06/20  1208      History   Chief Complaint Chief Complaint  Patient presents with  . Follow-up    HPI Melissa Adams is a 73 y.o. female history of A. fib, CHF, CKD, hypertension, presenting today for follow-up chest x-ray.  Patient was seen in the emergency room yesterday and had thoracentesis of pleural effusion.  1.12 L were removed.  Follow-up chest x-ray showed improvement of effusion as well as possible pneumonia.  She was prescribed doxycycline, but has not started this yet.  She is here today for recheck.  She reports her shortness of breath is much improved and is much closer to her baseline.  Denies any worsening since yesterday.  Has had increased appetite and has felt closer to her baseline from normal.  HPI  Past Medical History:  Diagnosis Date  . A-fib ([place])   . Allergies   . Arthritis   . Cardiomyopathy ([place])   . CHF (congestive heart failure) ([place])   . Chronic kidney disease   . Edema   . History of hemodialysis   . Hypertension   . Pulmonary hypertension ([place])   . Sleep apnea   . Tricuspid regurgitation     Patient Active Problem List   Diagnosis Date Noted  . ESRD on dialysis ([place]) 01/11/2020  . Acute on chronic systolic heart failure ([place])   . Pressure injury of skin 10/22/2019  . Dyspnea   . COVID-19 virus infection   . CHF exacerbation ([place]) 10/12/2019  . Acute CHF (congestive heart failure) ([place]) 10/11/2019  . Atrial fibrillation, chronic ([place]) 10/11/2019  . Essential hypertension 10/11/2019  . Sleep apnea 10/11/2019  . AKI (acute kidney injury) ([place]) 10/11/2019  . Gout 10/11/2019  . Normocytic anemia 10/11/2019    Past Surgical History:  Procedure Laterality Date  . ABDOMINAL HYSTERECTOMY    . AV FISTULA PLACEMENT Left 11/04/2019   Procedure: Insertion Of Arteriovenous (Av) Gore-Tex Graft Arm;  Surgeon: [name], MD;  Location: [facility];  Service: Vascular;   Laterality: Left;  . CARDIAC CATHETERIZATION    . CARDIOVERSION N/A 10/26/2019   Procedure: CARDIOVERSION;  Surgeon: [name], MD;  Location: [facility] ENDOSCOPY;  Service: Cardiovascular;  Laterality: N/A;  . COLONOSCOPY W/ BIOPSIES AND POLYPECTOMY    . FISTULA SUPERFICIALIZATION Left 01/21/2020   Procedure: FISTULA SUPERFICIALIZATION OF FISTULA;  Surgeon: [name], MD;  Location: [facility];  Service: Vascular;  Laterality: Left;  . INSERTION OF DIALYSIS CATHETER Right 11/04/2019   Procedure: CONVERT TEMPORARY DIALYSIS CATHETER TO TUNNELED DIALYSIS CATHETER Right Internal Jugular.;  Surgeon: [name], MD;  Location: [facility];  Service: Vascular;  Laterality: Right;  . MULTIPLE TOOTH EXTRACTIONS    . REVISION OF ARTERIOVENOUS GORETEX GRAFT Left 01/21/2020   Procedure: REVISION OF ARTERIOVENOUS FISTULA WITH SIDE BRANCH LIGATION;  Surgeon: [name], MD;  Location: [facility];  Service: Vascular;  Laterality: Left;  . TEE WITHOUT CARDIOVERSION N/A 10/26/2019   Procedure: TRANSESOPHAGEAL ECHOCARDIOGRAM (TEE);  Surgeon: [name], MD;  Location: [facility] ENDOSCOPY;  Service: Cardiovascular;  Laterality: N/A;  . THORACENTESIS      OB History   No obstetric history on file.      Home Medications    Prior to Admission medications   Medication Sig Start Date End Date Taking? Authorizing Provider  albuterol (VENTOLIN HFA) 108 (90 Base) MCG/ACT inhaler Inhale 2 puffs into the lungs every 6 (  six) hours as needed for wheezing or shortness of breath. 11/30/19  Yes [name], NP  allopurinol (ZYLOPRIM) 300 MG tablet Take 1 tablet (300 mg total) by mouth daily. 11/30/19  Yes [name], NP  apixaban (ELIQUIS) 5 MG TABS tablet Take 1 tablet (5 mg total) by mouth 2 (two) times daily. 12/15/19  Yes [name], MD  atorvastatin (LIPITOR) 40 MG tablet Take 1 tablet (40 mg total) by mouth daily. 11/30/19  Yes [name], NP  B Complex-C-Folic Acid (DIALYVITE  800) 0.8 MG TABS Take 1 tablet by mouth daily.   Yes [provider]  cetirizine (ZYRTEC) 10 MG tablet Take 10 mg by mouth daily as needed for allergies.   Yes [provider]  cinacalcet (SENSIPAR) 30 MG tablet Take 30 mg by mouth daily with supper.  04/18/20  Yes [provider]  ferric citrate (AURYXIA) 1 GM 210 MG(Fe) tablet Take 210 mg by mouth 3 (three) times daily with meals.   Yes [provider]  furosemide (LASIX) 80 MG tablet Take 80 mg by mouth in the morning and at bedtime.   Yes [provider]  metoprolol succinate (TOPROL-XL) 25 MG 24 hr tablet Take 1.5 tablets (37.5 mg total) by mouth 2 (two) times daily. Patient taking differently: Take 37.5 mg by mouth in the morning and at bedtime.  12/23/19  Yes [name], MD  midodrine (PROAMATINE) 10 MG tablet Take 10 mg by mouth daily.  02/10/20  Yes [provider]  montelukast (SINGULAIR) 10 MG tablet Take 1 tablet (10 mg total) by mouth daily. 11/30/19  Yes [name], NP  Multiple Vitamin (MULTIVITAMIN WITH MINERALS) TABS tablet Take 1 tablet by mouth at bedtime.    Yes [provider]  vitamin C (VITAMIN C) 500 MG tablet Take 1 tablet (500 mg total) by mouth daily. 11/06/19  Yes Buriev, Arie Sabina, MD  acetaminophen (TYLENOL) 325 MG tablet Take 2 tablets (650 mg total) by mouth every 4 (four) hours as needed for headache or mild pain. 11/05/19   Kinnie Feil, MD  doxycycline (VIBRAMYCIN) 100 MG capsule Take 1 capsule (100 mg total) by mouth 2 (two) times daily for 5 days. 05/05/20 05/10/20  Garald Balding, PA-C  lidocaine-prilocaine (EMLA) cream Apply 1 application topically as needed (dialysis).  04/07/20   [provider]  oxyCODONE-acetaminophen (PERCOCET) 5-325 MG tablet Take 1 tablet by mouth every 4 (four) hours as needed for severe pain. 1-2 tablets every 4-6 hours not to exceed 6 in 24 hours 01/21/20 01/20/21  Baglia, Corrina, PA-C  sevelamer  carbonate (RENVELA) 800 MG tablet Take 1,600 mg by mouth 3 (three) times daily with meals.  01/29/20   [provider]  traMADol (ULTRAM) 50 MG tablet Take 50 mg by mouth every 6 (six) hours as needed for moderate pain or severe pain.    [provider]    Family History Family History  Problem Relation Age of Onset  . Seizures Father   . CAD Father   . Diabetes Sister   . Lupus Sister   . Hypertension Sister     Social History Social History   Tobacco Use  . Smoking status: Former Smoker    Types: Cigarettes  . Smokeless tobacco: Never Used  Vaping Use  . Vaping Use: Never used  Substance Use Topics  . Alcohol use: Not Currently  . Drug use: Not Currently     Allergies  Black walnut flavor and Shellfish allergy   Review of Systems Review of Systems  Constitutional: Negative for activity change, appetite change, chills, fatigue and fever.  HENT: Negative for congestion, ear pain, rhinorrhea, sinus pressure, sore throat and trouble swallowing.   Eyes: Negative for discharge and redness.  Respiratory: Positive for cough and shortness of breath. Negative for chest tightness.   Cardiovascular: Negative for chest pain.  Gastrointestinal: Negative for abdominal pain, diarrhea, nausea and vomiting.  Musculoskeletal: Negative for myalgias.  Skin: Negative for rash.  Neurological: Negative for dizziness, light-headedness and headaches.     Physical Exam Triage Vital Signs ED Triage Vitals  Enc Vitals Group     BP 05/06/20 1344 92/63     Pulse Rate 05/06/20 1344 62     Resp 05/06/20 1344 16     Temp 05/06/20 1344 98.4 F (36.9 C)     Temp Source 05/06/20 1344 Oral     SpO2 05/06/20 1344 98 %     Weight --      Height --      Head Circumference --      Peak Flow --      Pain Score 05/06/20 1347 0     Pain Loc --      Pain Edu? --      Excl. in [place]? --    No data found.  Updated Vital Signs BP 92/63   Pulse 62 Comment: irregular; per  daughter, pt was in a-fib yesterday  Temp 98.4 F (36.9 C) (Oral)   Resp 16   SpO2 98%   Visual Acuity Right Eye Distance:   Left Eye Distance:   Bilateral Distance:    Right Eye Near:   Left Eye Near:    Bilateral Near:     Physical Exam Vitals and nursing note reviewed.  Constitutional:      Appearance: She is well-developed.     Comments: No acute distress  HENT:     Head: Normocephalic and atraumatic.     Nose: Nose normal.  Eyes:     Conjunctiva/sclera: Conjunctivae normal.  Cardiovascular:     Rate and Rhythm: Normal rate.  Pulmonary:     Effort: Pulmonary effort is normal. No respiratory distress.     Comments: Breathing comfortably at rest, no cough during visit, crackles noted at base of right lung Abdominal:     General: There is no distension.  Musculoskeletal:        General: Normal range of motion.     Cervical back: Neck supple.  Skin:    General: Skin is warm and dry.  Neurological:     Mental Status: She is alert and oriented to person, place, and time.      UC Treatments / Results  Labs (all labs ordered are listed, but only abnormal results are displayed) Labs Reviewed - No data to display  EKG   Radiology DG Chest 1 View  Result Date: 05/05/2020 CLINICAL DATA:  Right pleural effusion, post thoracentesis EXAM: CHEST  1 VIEW COMPARISON:  Earlier film of the same day FINDINGS: Interval decrease in right pleural effusion with small residual. Improved right lower lung aeration with some residual peripheral airspace opacity. Small lateral pneumothorax at the right lung base. Left lung stable. Stable cardiomegaly.  Aortic Atherosclerosis (ICD10-170.0). Visualized bones unremarkable. IMPRESSION: Small lateral pneumothorax at the right lung base post thoracentesis, possibly ex vacuo. Consider follow-up chest radiograph in 2 hours to confirm stability, assuming the patient remains asymptomatic. Findings were  electronically communicated with confirmation  to Rushie Nyhan, NP, at the time of interpretation. Electronically Signed   By: Lucrezia Europe M.D.   On: 05/05/2020 16:58   DG Chest 2 View  Result Date: 05/06/2020 CLINICAL DATA:  Status post thoracentesis EXAM: CHEST - 2 VIEW COMPARISON:  May 05, 2020 FINDINGS: No evident pneumothorax. There is persistent pleural effusion in the right base with right base atelectasis and questionable consolidation. Lungs elsewhere are clear. Heart is mildly enlarged with pulmonary vascularity normal. No adenopathy. There is aortic atherosclerosis. There is mild anterior wedging of a midthoracic vertebral body. IMPRESSION: No pneumothorax. Fairly small persistent pleural effusion on the right with atelectasis. There may be associated pneumonia in the right base as well. Left lung clear. Stable cardiomegaly. No adenopathy. Aortic Atherosclerosis (ICD10-I70.0). Electronically Signed   By: Lowella Grip III M.D.   On: 05/06/2020 14:47   DG Chest 2 View  Result Date: 05/05/2020 CLINICAL DATA:  Chest pain, dizziness and shortness of breath for the past 3 days. The patient had dialysis this morning. EXAM: CHEST - 2 VIEW COMPARISON:  11/04/2019 FINDINGS: The previously demonstrated right jugular catheter has been removed. Grossly stable enlarged cardiac silhouette. Interval moderate-sized right pleural effusion and adjacent mild right basilar airspace opacity. Mild peribronchial thickening. Clear left lung. Normal pulmonary vascularity. Aortic arch calcifications. Thoracic spine degenerative changes. Diffuse osteopenia. IMPRESSION: Interval moderate-sized right pleural effusion and adjacent mild right basilar atelectasis or pneumonia. Electronically Signed   By: Claudie Revering M.D.   On: 05/05/2020 13:58   DG Chest Portable 1 View  Result Date: 05/05/2020 CLINICAL DATA:  Pneumonia EXAM: PORTABLE CHEST 1 VIEW COMPARISON:  May 05, 2020 study obtained earlier in the day FINDINGS: Apparent small inferolateral pneumothorax  on the right is stable. No tension component. There is a small right pleural effusion. There is patchy airspace opacity in the right lower lung region, stable. The left lung is clear. There is cardiomegaly with pulmonary vascularity normal. No adenopathy. There is aortic atherosclerosis. No bone lesions. IMPRESSION: Small apparent loculated pneumothorax inferolateral right base. Small right pleural effusion with airspace opacity consistent with pneumonia in the right lower lung region. Left lung clear. Stable cardiomegaly. No adenopathy. Aortic Atherosclerosis (ICD10-I70.0). Electronically Signed   By: Lowella Grip III M.D.   On: 05/05/2020 19:22   US THORACENTESIS ASP PLEURAL SPACE W/IMG GUIDE  Result Date: 05/05/2020 INDICATION: Patient history of end-stage renal disease on Eliquis for AFib presented to the emergency department with 2 days of chest pain and shortness of breath found to have a right sided pleural effusion. Patient presents for therapeutic and diagnostic right sided thoracentesis. EXAM: ULTRASOUND GUIDED THERAPEUTIC AND DIAGNOSTIC RIGHT-SIDED THORACENTESIS MEDICATIONS: Lidocaine 1% 10 mL COMPLICATIONS: Small right base pneumothorax. Recommend 2 hour follow up chest xray or sooner if patient is symptomatic. PROCEDURE: An ultrasound guided thoracentesis was thoroughly discussed with the patient and questions answered. The benefits, risks, alternatives and complications were also discussed. The patient understands and wishes to proceed with the procedure. Written consent was obtained. Ultrasound was performed to localize and mark an adequate pocket of fluid in the right chest. The area was then prepped and draped in the normal sterile fashion. 1% Lidocaine was used for local anesthesia. Under ultrasound guidance a 6 Fr Safe-T-Centesis catheter was introduced. Thoracentesis was performed. The catheter was removed and a dressing applied. The procedure was terminated per patient request.  FINDINGS: A total of approximately 1.12 L of serosanguineous fluid was removed. Samples were sent  to the laboratory as requested by the clinical team. IMPRESSION: Successful ultrasound guided therapeutic and diagnostic right sided thoracentesis yielding 1.12 L of pleural fluid. Read by: Rushie Nyhan, NP Electronically Signed   By: Sandi Mariscal M.D.   On: 05/05/2020 16:56    Procedures Procedures (including critical care time)  Medications Ordered in UC Medications - No data to display  Initial Impression / Assessment and Plan / UC Course  I have reviewed the triage vital signs and the nursing notes.  Pertinent labs & imaging results that were available during my care of the patient were reviewed by me and considered in my medical decision making (see chart for details).     Chest x-ray appears stable and improved from x-ray yesterday.  Recommended patient to begin doxycycline.  Follow-up with primary care and cardiology as planned.  Patient to return here or emergency room if developing any worsening symptoms again.  Discussed strict return precautions. Patient verbalized understanding and is agreeable with plan.  Final Clinical Impressions(s) / UC Diagnoses   Final diagnoses:  Pleural effusion  Pneumonia of right lower lobe due to infectious organism     Discharge Instructions     Xray appears stable Begin doxycycline Follow up with primary care and cardiology    ED Prescriptions    None     PDMP not reviewed this encounter.   Janith Lima, [place] 05/07/20 [phone]

## 2020-05-08 LAB — PATHOLOGIST SMEAR REVIEW

## 2020-05-10 LAB — CULTURE, BODY FLUID W GRAM STAIN -BOTTLE: Culture: NO GROWTH

## 2020-05-12 ENCOUNTER — Telehealth: Payer: Self-pay | Admitting: *Deleted

## 2020-05-12 NOTE — Telephone Encounter (Signed)
-----   Message from Freada Bergeron, [place] sent at 04/28/2020  1:12 PM EDT ----- Regarding: precert Cpap Titration

## 2020-05-20 ENCOUNTER — Other Ambulatory Visit (HOSPITAL_COMMUNITY)
Admission: RE | Admit: 2020-05-20 | Discharge: 2020-05-20 | Disposition: A | Discharge status: Home / Self Care | Payer: Medicare HMO | Source: Ambulatory Visit | Attending: Surgery | Admitting: Surgery

## 2020-05-20 DIAGNOSIS — Z20822 Contact with and (suspected) exposure to covid-19: Secondary | ICD-10-CM | POA: Diagnosis not present

## 2020-05-20 DIAGNOSIS — Z01812 Encounter for preprocedural laboratory examination: Secondary | ICD-10-CM | POA: Diagnosis present

## 2020-05-20 LAB — SARS CORONAVIRUS 2 (TAT 6-24 HRS): SARS Coronavirus 2: NEGATIVE

## 2020-05-22 ENCOUNTER — Telehealth (HOSPITAL_COMMUNITY): Payer: Self-pay | Admitting: *Deleted

## 2020-05-22 NOTE — Addendum Note (Signed)
Addended by: Freada Bergeron on: 05/22/2020 05:36 PM   Modules accepted: Level of Service, SmartSet

## 2020-05-22 NOTE — Telephone Encounter (Signed)
This encounter was created in error - please disregard.

## 2020-05-22 NOTE — Telephone Encounter (Signed)
Pt's daughter left a VM on triage line stating she has never heard back about the pre-cert on pt's cpap and would like an update.   Message sent to Gershon Cull, CMA as she has been working on this per the chart.

## 2020-05-22 NOTE — Telephone Encounter (Signed)
Regarding:  Cpap Titration  Order has been sent to precert. Patient notified.

## 2020-05-23 ENCOUNTER — Encounter (HOSPITAL_COMMUNITY): Payer: Self-pay | Admitting: Surgery

## 2020-05-23 ENCOUNTER — Ambulatory Visit (HOSPITAL_COMMUNITY)
Admission: RE | Admit: 2020-05-23 | Discharge: 2020-05-23 | Disposition: A | Discharge status: Home / Self Care | Payer: Medicare HMO | Attending: Surgery | Admitting: Surgery

## 2020-05-23 ENCOUNTER — Other Ambulatory Visit: Payer: Self-pay

## 2020-05-23 ENCOUNTER — Encounter (HOSPITAL_COMMUNITY)
Admission: RE | Disposition: A | Discharge status: Home / Self Care | Payer: Self-pay | Source: Home / Self Care | Attending: Surgery

## 2020-05-23 DIAGNOSIS — I272 Pulmonary hypertension, unspecified: Secondary | ICD-10-CM | POA: Diagnosis not present

## 2020-05-23 DIAGNOSIS — G4733 Obstructive sleep apnea (adult) (pediatric): Secondary | ICD-10-CM | POA: Diagnosis not present

## 2020-05-23 DIAGNOSIS — I132 Hypertensive heart and chronic kidney disease with heart failure and with stage 5 chronic kidney disease, or end stage renal disease: Secondary | ICD-10-CM | POA: Diagnosis not present

## 2020-05-23 DIAGNOSIS — Z87891 Personal history of nicotine dependence: Secondary | ICD-10-CM | POA: Insufficient documentation

## 2020-05-23 DIAGNOSIS — I4891 Unspecified atrial fibrillation: Secondary | ICD-10-CM | POA: Insufficient documentation

## 2020-05-23 DIAGNOSIS — I071 Rheumatic tricuspid insufficiency: Secondary | ICD-10-CM | POA: Insufficient documentation

## 2020-05-23 DIAGNOSIS — I429 Cardiomyopathy, unspecified: Secondary | ICD-10-CM | POA: Diagnosis not present

## 2020-05-23 DIAGNOSIS — N186 End stage renal disease: Secondary | ICD-10-CM | POA: Insufficient documentation

## 2020-05-23 DIAGNOSIS — Z833 Family history of diabetes mellitus: Secondary | ICD-10-CM | POA: Insufficient documentation

## 2020-05-23 DIAGNOSIS — I509 Heart failure, unspecified: Secondary | ICD-10-CM | POA: Insufficient documentation

## 2020-05-23 DIAGNOSIS — M1990 Unspecified osteoarthritis, unspecified site: Secondary | ICD-10-CM | POA: Insufficient documentation

## 2020-05-23 DIAGNOSIS — Z992 Dependence on renal dialysis: Secondary | ICD-10-CM | POA: Diagnosis not present

## 2020-05-23 DIAGNOSIS — Z8249 Family history of ischemic heart disease and other diseases of the circulatory system: Secondary | ICD-10-CM | POA: Insufficient documentation

## 2020-05-23 DIAGNOSIS — T82898A Other specified complication of vascular prosthetic devices, implants and grafts, initial encounter: Secondary | ICD-10-CM | POA: Diagnosis not present

## 2020-05-23 HISTORY — PX: A/V FISTULAGRAM: CATH118298

## 2020-05-23 LAB — POCT I-STAT, CHEM 8
BUN: 36 mg/dL — ABNORMAL HIGH (ref 8–23)
Calcium, Ion: 0.85 mmol/L — CL (ref 1.15–1.40)
Chloride: 94 mmol/L — ABNORMAL LOW (ref 98–111)
Creatinine, Ser: 6.1 mg/dL — ABNORMAL HIGH (ref 0.44–1.00)
Glucose, Bld: 88 mg/dL (ref 70–99)
HCT: 41 % (ref 36.0–46.0)
Hemoglobin: 13.9 g/dL (ref 12.0–15.0)
Potassium: 5.1 mmol/L (ref 3.5–5.1)
Sodium: 137 mmol/L (ref 135–145)
TCO2: 39 mmol/L — ABNORMAL HIGH (ref 22–32)

## 2020-05-23 SURGERY — A/V FISTULAGRAM
Anesthesia: LOCAL | Laterality: Left

## 2020-05-23 MED ORDER — DIPHENHYDRAMINE HCL 50 MG/ML IJ SOLN
INTRAMUSCULAR | Status: AC
  Filled 2020-05-23: qty 1

## 2020-05-23 MED ORDER — LIDOCAINE HCL (PF) 1 % IJ SOLN
INTRAMUSCULAR | Status: DC | PRN
  Administered 2020-05-23: 2 mL

## 2020-05-23 MED ORDER — SODIUM CHLORIDE 0.9% FLUSH: 3.0000 mL | Freq: Two times a day (BID) | INTRAVENOUS | Status: DC

## 2020-05-23 MED ORDER — FENTANYL CITRATE (PF) 100 MCG/2ML IJ SOLN
INTRAMUSCULAR | Status: AC
  Filled 2020-05-23: qty 2

## 2020-05-23 MED ORDER — HEPARIN (PORCINE) IN NACL 1000-0.9 UT/500ML-% IV SOLN
INTRAVENOUS | Status: AC
  Filled 2020-05-23: qty 1000

## 2020-05-23 MED ORDER — IODIXANOL 320 MG/ML IV SOLN
INTRAVENOUS | Status: DC | PRN
  Administered 2020-05-23: 25 mL

## 2020-05-23 MED ORDER — METHYLPREDNISOLONE SODIUM SUCC 125 MG IJ SOLR
125.0000 mg | INTRAMUSCULAR | Status: AC
  Administered 2020-05-23: 125 mg via INTRAVENOUS
  Filled 2020-05-23: qty 2

## 2020-05-23 MED ORDER — SODIUM CHLORIDE 0.9% FLUSH: 3.0000 mL | INTRAVENOUS | Status: DC | PRN

## 2020-05-23 MED ORDER — LIDOCAINE HCL (PF) 1 % IJ SOLN
INTRAMUSCULAR | Status: AC
  Filled 2020-05-23: qty 30

## 2020-05-23 MED ORDER — DIPHENHYDRAMINE HCL 50 MG/ML IJ SOLN
INTRAMUSCULAR | Status: DC | PRN
  Administered 2020-05-23: 25 mg via INTRAVENOUS

## 2020-05-23 MED ORDER — SODIUM CHLORIDE 0.9 % IV SOLN: 250.0000 mL | INTRAVENOUS | Status: DC | PRN

## 2020-05-23 MED ORDER — HEPARIN (PORCINE) IN NACL 1000-0.9 UT/500ML-% IV SOLN
INTRAVENOUS | Status: DC | PRN
  Administered 2020-05-23: 500 mL

## 2020-05-23 SURGICAL SUPPLY — 9 items
BAG SNAP BAND KOVER 36X36 (MISCELLANEOUS) ×2 IMPLANT
COVER DOME SNAP 22 D (MISCELLANEOUS) ×2 IMPLANT
KIT MICROPUNCTURE NIT STIFF (SHEATH) ×2 IMPLANT
PROTECTION STATION PRESSURIZED (MISCELLANEOUS) ×2
SHEATH PROBE COVER 6X72 (BAG) ×2 IMPLANT
STATION PROTECTION PRESSURIZED (MISCELLANEOUS) ×1 IMPLANT
STOPCOCK MORSE 400PSI 3WAY (MISCELLANEOUS) ×2 IMPLANT
TRAY PV CATH (CUSTOM PROCEDURE TRAY) ×2 IMPLANT
TUBING CIL FLEX 10 FLL-RA (TUBING) ×2 IMPLANT

## 2020-05-23 NOTE — Discharge Instructions (Signed)

## 2020-05-23 NOTE — H&P (Signed)
[facility]ist of [facility]  Patient name: Melissa Adams MRN: [id number] DOB: 10/02/1946 Sex: female     HISOTRY OF PRESENT ILLNESS:    Melissa Adams is a 74 y.o. female who is status post left BCF in December 2020.  It matured but was too deep to cannulate and so she had elevation in February 2021   PAST MEDICAL HISTORY:   Past Medical History:  Diagnosis Date  . A-fib ([place])   . Allergies   . Arthritis   . Cardiomyopathy ([place])   . CHF (congestive heart failure) ([place])   . Chronic kidney disease   . Edema   . History of hemodialysis   . Hypertension   . Pulmonary hypertension ([place])   . Sleep apnea   . Tricuspid regurgitation      FAMILY HISTORY:   Family History  Problem Relation Age of Onset  . Seizures Father   . CAD Father   . Diabetes Sister   . Lupus Sister   . Hypertension Sister     SOCIAL HISTORY:   Social History   Tobacco Use  . Smoking status: Former Smoker    Types: Cigarettes  . Smokeless tobacco: Never Used  Substance Use Topics  . Alcohol use: Not Currently     ALLERGIES:   Allergies  Allergen Reactions  . Black Walnut Flavor Anaphylaxis  . Shellfish Allergy Itching    Makes throat itch      CURRENT MEDICATIONS:   Current Facility-Administered Medications  Medication Dose Route Frequency Provider Last Rate Last Admin  . 0.9 %  sodium chloride infusion  250 mL Intravenous PRN [name], MD      . methylPREDNISolone sodium succinate (SOLU-MEDROL) 125 mg/2 mL injection 125 mg  125 mg Intravenous On Call [name], MD      . sodium chloride flush (NS) 0.9 % injection 3 mL  3 mL Intravenous Q12H [name], MD      . sodium chloride flush (NS) 0.9 % injection 3 mL  3 mL Intravenous PRN [name], MD        REVIEW OF SYSTEMS:   [X]  denotes positive finding, [ ]  denotes negative finding Cardiac  Comments:  Chest pain or chest pressure:    Shortness  of breath upon exertion:    Short of breath when lying flat:    Irregular heart rhythm:        Vascular    Pain in calf, thigh, or hip brought on by ambulation:    Pain in feet at night that wakes you up from your sleep:     Blood clot in your veins:    Leg swelling:         Pulmonary    Oxygen at home:    Productive cough:     Wheezing:         Neurologic    Sudden weakness in arms or legs:     Sudden numbness in arms or legs:     Sudden onset of difficulty speaking or slurred speech:    Temporary loss of vision in one eye:     Problems with dizziness:         Gastrointestinal    Blood in stool:     Vomited blood:         Genitourinary    Burning when urinating:     Blood in urine:        Psychiatric    Major  depression:         Hematologic    Bleeding problems:    Problems with blood clotting too easily:        Skin    Rashes or ulcers:        Constitutional    Fever or chills:      PHYSICAL EXAM:   Vitals:   05/23/20 0537  BP: (!) 101/45  Pulse: 79  Resp: 15  Temp: 97.9 F (36.6 C)  TempSrc: Oral  SpO2: 96%  Weight: 105 kg  Height: 5\' 3"  (1.6 m)    GENERAL: The patient is a well-nourished female, in no acute distress. The vital signs are documented above. CARDIAC: There is a regular rate and rhy PULMONARY: Non-labored respirations MUSCULOSKELETAL: There are no major deformities or cyanosis. NEUROLOGIC: No focal weakness or paresthesias are detected. SKIN: There are no ulcers or rashes noted. PSYCHIATRIC: The patient has a normal affect.    MEDICAL ISSUES:   Plan for fistulogram with possible intervention.  Details of the procedure as well as risks were discussed with the patient    [name], MD, FACS [facility] [phone] Pager [phone]

## 2020-05-23 NOTE — Op Note (Signed)
    Patient name: Melissa Adams MRN: [id number] DOB: 1946-08-25 Sex: female  05/23/2020 Pre-operative Diagnosis: ESRD Post-operative diagnosis:  Same Surgeon:  Annamarie Major Procedure Performed:  1.  Ultrasound-guided access, left brachiocephalic fistula  2.  Fistulogram    Indications: The patient has a history of a left brachiocephalic fistula which has had to be elevated with branch ligation.  She has been having difficulty with bleeding after dialysis.  She comes in today for fistulogram  Procedure:  The patient was identified in the holding area and taken to room 8.  The patient was then placed supine on the table and prepped and draped in the usual sterile fashion.  A time out was called.   Ultrasound was used to evaluate the fistula.  The vein was patent and compressible.  A digital ultrasound image was acquired.  The fistula was then accessed under ultrasound guidance using a micropuncture needle.  An 018 wire was then asvanced without resistance and a micropuncture sheath was placed.  Contrast injections were then performed through the sheath.  Findings: The central venous system is widely patent.  The cephalic vein is widely patent without stenosis.  The arteriovenous anastomosis is widely patent.   Intervention: None  Impression:  #1 widely patent left brachiocephalic fistula  #2 no evidence of central venous stenosis    V. Annamarie Major, M.D., [facility] of [place] Office: [phone] Pager:  [phone]

## 2020-05-24 ENCOUNTER — Telehealth: Payer: Self-pay | Admitting: *Deleted

## 2020-05-24 NOTE — Telephone Encounter (Signed)
CPAP titration PA request submitted to [facility] via web portal.

## 2020-05-31 ENCOUNTER — Telehealth: Payer: Self-pay | Admitting: *Deleted

## 2020-05-31 NOTE — Telephone Encounter (Signed)
-----   Message from Freada Bergeron, [name] sent at 05/22/2020  5:34 PM EDT ----- Regarding: precert Pt needs a Friday night cpap titration

## 2020-05-31 NOTE — Telephone Encounter (Signed)
Staff message sent to [facility]ae [facility] ok to schedule CPAP titration. [employer] Auth received. Auth # [id number]. Valid dates 05/29/20 to 06/28/20.

## 2020-06-01 ENCOUNTER — Telehealth: Payer: Self-pay | Admitting: *Deleted

## 2020-06-01 DIAGNOSIS — G4730 Sleep apnea, unspecified: Secondary | ICD-10-CM

## 2020-06-01 NOTE — Telephone Encounter (Signed)
-----   Message from Lauralee Evener, [place] sent at 05/31/2020  8:05 AM EDT ----- Regarding: RE: precert Ok to schedule. [employer] Auth received. Auth # [id number]. Valid dates 05/29/20 to 06/28/20. ----- Message ----- From: Freada Bergeron, CMA Sent: 05/22/2020   5:34 PM EDT To: [facility] Subject: precert                                        Pt needs a Friday night cpap titration

## 2020-06-01 NOTE — Telephone Encounter (Signed)
Patient is scheduled for CPAP Titration on 06/25/20. Patient understands her titration study will be done at [facility] sleep lab. Patient understands she will receive a letter in a week or so detailing appointment, date, time, and location. Patient understands to call if she does not receive the letter  in a timely manner. Patient agrees with treatment and thanked me for call.

## 2020-06-08 ENCOUNTER — Other Ambulatory Visit (INDEPENDENT_AMBULATORY_CARE_PROVIDER_SITE_OTHER): Payer: Self-pay | Admitting: Primary Care

## 2020-06-08 NOTE — Telephone Encounter (Signed)
Medication Refill - Medication: atorvastatin (LIPITOR) 40 MG tablet   allopurinol (ZYLOPRIM) 300 MG tablet   Pt has changed pharmacy  Has the patient contacted their pharmacy? No. (Agent: If no, request that the patient contact the pharmacy for the refill.) (Agent: If yes, when and what did the pharmacy advise?)  Preferred Pharmacy (with phone number or street name):  [place], [place] Phone:  [phone]  Fax:  [phone]       Agent: Please be advised that RX refills may take up to 3 business days. We ask that you follow-up with your pharmacy.

## 2020-06-09 NOTE — Telephone Encounter (Signed)
Patient out of medication and would like request expedited.

## 2020-06-10 ENCOUNTER — Encounter (HOSPITAL_COMMUNITY): Payer: Self-pay | Admitting: *Deleted

## 2020-06-10 ENCOUNTER — Emergency Department (HOSPITAL_COMMUNITY): Payer: Medicare HMO

## 2020-06-10 ENCOUNTER — Other Ambulatory Visit: Payer: Self-pay

## 2020-06-10 ENCOUNTER — Inpatient Hospital Stay (HOSPITAL_COMMUNITY)
Admission: EM | Admit: 2020-06-10 | Discharge: 2020-06-14 | DRG: 291 | Disposition: A | Discharge status: Home / Self Care | Payer: Medicare HMO | Attending: Internal Medicine | Admitting: Internal Medicine

## 2020-06-10 ENCOUNTER — Ambulatory Visit (HOSPITAL_COMMUNITY)
Admission: EM | Admit: 2020-06-10 | Discharge: 2020-06-10 | Disposition: A | Discharge status: Nursing Home (Includes ICF) | Payer: Medicare HMO | Source: Home / Self Care

## 2020-06-10 DIAGNOSIS — M1990 Unspecified osteoarthritis, unspecified site: Secondary | ICD-10-CM | POA: Diagnosis present

## 2020-06-10 DIAGNOSIS — E669 Obesity, unspecified: Secondary | ICD-10-CM | POA: Diagnosis present

## 2020-06-10 DIAGNOSIS — J918 Pleural effusion in other conditions classified elsewhere: Secondary | ICD-10-CM | POA: Diagnosis present

## 2020-06-10 DIAGNOSIS — Z87891 Personal history of nicotine dependence: Secondary | ICD-10-CM

## 2020-06-10 DIAGNOSIS — I9589 Other hypotension: Secondary | ICD-10-CM | POA: Diagnosis present

## 2020-06-10 DIAGNOSIS — I700 Atherosclerosis of aorta: Secondary | ICD-10-CM | POA: Diagnosis present

## 2020-06-10 DIAGNOSIS — J45909 Unspecified asthma, uncomplicated: Secondary | ICD-10-CM | POA: Diagnosis present

## 2020-06-10 DIAGNOSIS — M8580 Other specified disorders of bone density and structure, unspecified site: Secondary | ICD-10-CM | POA: Diagnosis present

## 2020-06-10 DIAGNOSIS — K7460 Unspecified cirrhosis of liver: Secondary | ICD-10-CM | POA: Diagnosis present

## 2020-06-10 DIAGNOSIS — D539 Nutritional anemia, unspecified: Secondary | ICD-10-CM | POA: Diagnosis present

## 2020-06-10 DIAGNOSIS — G4733 Obstructive sleep apnea (adult) (pediatric): Secondary | ICD-10-CM | POA: Diagnosis present

## 2020-06-10 DIAGNOSIS — I428 Other cardiomyopathies: Secondary | ICD-10-CM | POA: Diagnosis present

## 2020-06-10 DIAGNOSIS — E861 Hypovolemia: Secondary | ICD-10-CM | POA: Diagnosis present

## 2020-06-10 DIAGNOSIS — N2581 Secondary hyperparathyroidism of renal origin: Secondary | ICD-10-CM | POA: Diagnosis present

## 2020-06-10 DIAGNOSIS — Z992 Dependence on renal dialysis: Secondary | ICD-10-CM

## 2020-06-10 DIAGNOSIS — I071 Rheumatic tricuspid insufficiency: Secondary | ICD-10-CM | POA: Diagnosis present

## 2020-06-10 DIAGNOSIS — Z6839 Body mass index (BMI) 39.0-39.9, adult: Secondary | ICD-10-CM

## 2020-06-10 DIAGNOSIS — I509 Heart failure, unspecified: Secondary | ICD-10-CM

## 2020-06-10 DIAGNOSIS — D696 Thrombocytopenia, unspecified: Secondary | ICD-10-CM | POA: Diagnosis present

## 2020-06-10 DIAGNOSIS — I132 Hypertensive heart and chronic kidney disease with heart failure and with stage 5 chronic kidney disease, or end stage renal disease: Principal | ICD-10-CM | POA: Diagnosis present

## 2020-06-10 DIAGNOSIS — R0600 Dyspnea, unspecified: Secondary | ICD-10-CM | POA: Diagnosis present

## 2020-06-10 DIAGNOSIS — M898X9 Other specified disorders of bone, unspecified site: Secondary | ICD-10-CM | POA: Diagnosis present

## 2020-06-10 DIAGNOSIS — Z79899 Other long term (current) drug therapy: Secondary | ICD-10-CM

## 2020-06-10 DIAGNOSIS — M109 Gout, unspecified: Secondary | ICD-10-CM | POA: Diagnosis present

## 2020-06-10 DIAGNOSIS — E785 Hyperlipidemia, unspecified: Secondary | ICD-10-CM | POA: Diagnosis present

## 2020-06-10 DIAGNOSIS — Z20822 Contact with and (suspected) exposure to covid-19: Secondary | ICD-10-CM | POA: Diagnosis present

## 2020-06-10 DIAGNOSIS — I482 Chronic atrial fibrillation, unspecified: Secondary | ICD-10-CM | POA: Diagnosis present

## 2020-06-10 DIAGNOSIS — R188 Other ascites: Secondary | ICD-10-CM

## 2020-06-10 DIAGNOSIS — I5023 Acute on chronic systolic (congestive) heart failure: Secondary | ICD-10-CM | POA: Diagnosis present

## 2020-06-10 DIAGNOSIS — Z8616 Personal history of COVID-19: Secondary | ICD-10-CM

## 2020-06-10 DIAGNOSIS — Z7901 Long term (current) use of anticoagulants: Secondary | ICD-10-CM

## 2020-06-10 DIAGNOSIS — Z87892 Personal history of anaphylaxis: Secondary | ICD-10-CM

## 2020-06-10 DIAGNOSIS — R140 Abdominal distension (gaseous): Secondary | ICD-10-CM

## 2020-06-10 DIAGNOSIS — K8020 Calculus of gallbladder without cholecystitis without obstruction: Secondary | ICD-10-CM | POA: Diagnosis present

## 2020-06-10 DIAGNOSIS — Z91018 Allergy to other foods: Secondary | ICD-10-CM

## 2020-06-10 DIAGNOSIS — Z8249 Family history of ischemic heart disease and other diseases of the circulatory system: Secondary | ICD-10-CM

## 2020-06-10 DIAGNOSIS — I4819 Other persistent atrial fibrillation: Secondary | ICD-10-CM | POA: Diagnosis present

## 2020-06-10 DIAGNOSIS — J90 Pleural effusion, not elsewhere classified: Secondary | ICD-10-CM

## 2020-06-10 DIAGNOSIS — N186 End stage renal disease: Secondary | ICD-10-CM | POA: Diagnosis present

## 2020-06-10 DIAGNOSIS — I10 Essential (primary) hypertension: Secondary | ICD-10-CM | POA: Diagnosis present

## 2020-06-10 DIAGNOSIS — Z91013 Allergy to seafood: Secondary | ICD-10-CM

## 2020-06-10 LAB — COMPREHENSIVE METABOLIC PANEL
ALT: 43 U/L (ref 0–44)
AST: 43 U/L — ABNORMAL HIGH (ref 15–41)
Albumin: 3.3 g/dL — ABNORMAL LOW (ref 3.5–5.0)
Alkaline Phosphatase: 275 U/L — ABNORMAL HIGH (ref 38–126)
Anion gap: 13 (ref 5–15)
BUN: 25 mg/dL — ABNORMAL HIGH (ref 8–23)
CO2: 31 mmol/L (ref 22–32)
Calcium: 7.3 mg/dL — ABNORMAL LOW (ref 8.9–10.3)
Chloride: 95 mmol/L — ABNORMAL LOW (ref 98–111)
Creatinine, Ser: 5.65 mg/dL — ABNORMAL HIGH (ref 0.44–1.00)
GFR calc Af Amer: 8 mL/min — ABNORMAL LOW (ref >60)
GFR calc non Af Amer: 7 mL/min — ABNORMAL LOW (ref >60)
Glucose, Bld: 91 mg/dL (ref 70–99)
Potassium: 3.7 mmol/L (ref 3.5–5.1)
Sodium: 139 mmol/L (ref 135–145)
Total Bilirubin: 2.1 mg/dL — ABNORMAL HIGH (ref 0.3–1.2)
Total Protein: 6.7 g/dL (ref 6.5–8.1)

## 2020-06-10 LAB — BRAIN NATRIURETIC PEPTIDE: B Natriuretic Peptide: 3465.4 pg/mL — ABNORMAL HIGH (ref 0.0–100.0)

## 2020-06-10 LAB — CBC
HCT: 39.4 % (ref 36.0–46.0)
Hemoglobin: 12.1 g/dL (ref 12.0–15.0)
MCH: 31.8 pg (ref 26.0–34.0)
MCHC: 30.7 g/dL (ref 30.0–36.0)
MCV: 103.4 fL — ABNORMAL HIGH (ref 80.0–100.0)
Platelets: 127 10*3/uL — ABNORMAL LOW (ref 150–400)
RBC: 3.81 MIL/uL — ABNORMAL LOW (ref 3.87–5.11)
RDW: 16.9 % — ABNORMAL HIGH (ref 11.5–15.5)
WBC: 7.4 10*3/uL (ref 4.0–10.5)
nRBC: 0 % (ref 0.0–0.2)

## 2020-06-10 LAB — LIPASE, BLOOD: Lipase: 44 U/L (ref 11–51)

## 2020-06-10 LAB — TROPONIN I (HIGH SENSITIVITY)
Troponin I (High Sensitivity): 26 ng/L — ABNORMAL HIGH (ref <18)
Troponin I (High Sensitivity): 20 ng/L — ABNORMAL HIGH (ref <18)

## 2020-06-10 LAB — SARS CORONAVIRUS 2 BY RT PCR (HOSPITAL ORDER, PERFORMED IN ~~LOC~~ HOSPITAL LAB): SARS Coronavirus 2: NEGATIVE

## 2020-06-10 LAB — LACTATE DEHYDROGENASE: LDH: 295 U/L — ABNORMAL HIGH (ref 98–192)

## 2020-06-10 MED ORDER — APIXABAN 5 MG PO TABS
5.0000 mg | ORAL_TABLET | Freq: Two times a day (BID) | ORAL | Status: DC
  Administered 2020-06-11 – 2020-06-14 (×8): 5 mg via ORAL
  Filled 2020-06-10 (×8): qty 1

## 2020-06-10 MED ORDER — ALBUTEROL SULFATE (2.5 MG/3ML) 0.083% IN NEBU: 3.0000 mL | INHALATION_SOLUTION | Freq: Four times a day (QID) | RESPIRATORY_TRACT | Status: DC | PRN

## 2020-06-10 MED ORDER — ATORVASTATIN CALCIUM 40 MG PO TABS
40.0000 mg | ORAL_TABLET | Freq: Every day | ORAL | Status: DC
  Administered 2020-06-11 – 2020-06-14 (×4): 40 mg via ORAL
  Filled 2020-06-10 (×4): qty 1

## 2020-06-10 MED ORDER — SODIUM CHLORIDE 0.9 % IV SOLN
500.0000 mg | Freq: Once | INTRAVENOUS | Status: AC
  Administered 2020-06-10: 500 mg via INTRAVENOUS
  Filled 2020-06-10: qty 500

## 2020-06-10 MED ORDER — FUROSEMIDE 10 MG/ML IJ SOLN
40.0000 mg | Freq: Once | INTRAMUSCULAR | Status: AC
  Administered 2020-06-10: 40 mg via INTRAVENOUS
  Filled 2020-06-10: qty 4

## 2020-06-10 MED ORDER — ACETAMINOPHEN 325 MG PO TABS: 650.0000 mg | ORAL_TABLET | Freq: Four times a day (QID) | ORAL | Status: DC | PRN

## 2020-06-10 MED ORDER — ACETAMINOPHEN 650 MG RE SUPP: 650.0000 mg | Freq: Four times a day (QID) | RECTAL | Status: DC | PRN

## 2020-06-10 MED ORDER — POLYETHYLENE GLYCOL 3350 17 G PO PACK: 17.0000 g | PACK | Freq: Every day | ORAL | Status: DC | PRN

## 2020-06-10 MED ORDER — METOPROLOL SUCCINATE ER 25 MG PO TB24
37.5000 mg | ORAL_TABLET | Freq: Two times a day (BID) | ORAL | Status: DC
  Administered 2020-06-11: 37.5 mg via ORAL
  Filled 2020-06-10: qty 2

## 2020-06-10 MED ORDER — MONTELUKAST SODIUM 10 MG PO TABS
10.0000 mg | ORAL_TABLET | Freq: Every day | ORAL | Status: DC
  Administered 2020-06-11 – 2020-06-14 (×4): 10 mg via ORAL
  Filled 2020-06-10 (×4): qty 1

## 2020-06-10 MED ORDER — CINACALCET HCL 30 MG PO TABS
30.0000 mg | ORAL_TABLET | Freq: Every day | ORAL | Status: DC
  Filled 2020-06-10: qty 1

## 2020-06-10 MED ORDER — SODIUM CHLORIDE 0.9 % IV SOLN
1.0000 g | Freq: Once | INTRAVENOUS | Status: AC
  Administered 2020-06-10: 1 g via INTRAVENOUS
  Filled 2020-06-10: qty 10

## 2020-06-10 MED ORDER — FUROSEMIDE 80 MG PO TABS
80.0000 mg | ORAL_TABLET | ORAL | Status: DC
  Administered 2020-06-11 – 2020-06-13 (×4): 80 mg via ORAL
  Filled 2020-06-10: qty 1
  Filled 2020-06-10 (×2): qty 4
  Filled 2020-06-10: qty 1

## 2020-06-10 MED ORDER — ASCORBIC ACID 500 MG PO TABS
500.0000 mg | ORAL_TABLET | Freq: Every day | ORAL | Status: DC
  Administered 2020-06-11 – 2020-06-14 (×4): 500 mg via ORAL
  Filled 2020-06-10 (×4): qty 1

## 2020-06-10 MED ORDER — ALLOPURINOL 300 MG PO TABS
300.0000 mg | ORAL_TABLET | Freq: Every day | ORAL | Status: DC
  Administered 2020-06-11 – 2020-06-14 (×4): 300 mg via ORAL
  Filled 2020-06-10 (×4): qty 1

## 2020-06-10 MED ORDER — SODIUM CHLORIDE 0.9% FLUSH
3.0000 mL | Freq: Once | INTRAVENOUS | Status: AC
  Administered 2020-06-10: 3 mL via INTRAVENOUS

## 2020-06-10 MED ORDER — LORATADINE 10 MG PO TABS
10.0000 mg | ORAL_TABLET | Freq: Every day | ORAL | Status: DC
  Administered 2020-06-11 – 2020-06-14 (×4): 10 mg via ORAL
  Filled 2020-06-10 (×4): qty 1

## 2020-06-10 NOTE — ED Triage Notes (Addendum)
Pt had recent pleural effusion and pneumonia dx; pt is on hemodialysis; Over past few days c/o SOB, cough, fatigue.  Pt alert, talkative, with good eye contact. Discussed with Dr. Burnett Harry - pt to go to ED for further eval. Pt and family verbalized understanding.  Patient is being discharged from the Urgent Care and sent to the Emergency Department via private vehicle with family . Per Dr. Burnett Harry, patient is in need of higher level of care due to SOB, fatigue with recent pleural effusion and pneumonia; pt is a hemodialysis pt. Patient is aware and verbalizes understanding of plan of care.

## 2020-06-10 NOTE — H&P (Signed)
Date: 06/10/2020               Patient Name:  Melissa Adams MRN: [id number]  DOB: [birth year] Age / Sex: 74 y.o., female   PCP: [name], NP         Medical Service: Internal Medicine Teaching Service         Attending Physician: Dr. Veryl Speak, MD    First Contact: Dr. Alease Frame Pager: [phone]  Second Contact: Dr. Eileen Stanford Pager: [phone]       After Hours (After 5p/  First Contact Pager: [phone]  weekends / holidays): Second Contact Pager: [phone]   Chief Complaint: Cough  History of Present Illness: Melissa Adams is a 74 y.o. F w/ PMHx of persistent Afib on Apixaban and Metoprolol succinate, HFrEF (30%), ESRD on hemodialysis MWF, OSA, allergic asthma, and normocytic anemia presenting with a productive cough that has worsened since onset 3 to 4 days ago.  Melissa Adams coughs up grey mucus and has been taking Mucinex at home without relief.  Melissa Adams has also had worsening SOB lying flat and with exertion over the past 3 days associated with bilateral lower extremity swelling and chest pain/upper abdominal pain secondary to cough.  Melissa Adams daughter present at bedside states that Melissa Adams can usually walk about 50 feet but has been less active recently and has become SOB with ADL's, and Melissa Adams daughter brought Melissa Adams to the ED for evaluation.  Melissa Adams endorses cold intolerance and light-headedness upon sitting up but denies any fevers or chills.  Melissa Adams denies any other symptoms.   Meds: No outpatient medications have been marked as taking for the 06/10/20 encounter [facility] Encounter).    Allergies: Allergies as of 06/10/2020 - Review Complete 06/10/2020  Allergen Reaction Noted  . Black walnut flavor Anaphylaxis 03/21/2020  . Shellfish allergy Itching 03/21/2020   Past Medical History:  Diagnosis Date  . A-fib ([place])   . Allergies   . Arthritis   . Cardiomyopathy ([place])   . CHF (congestive heart failure) ([place])   . Chronic kidney disease   . Edema   . History of hemodialysis   . Hypertension   .  Pulmonary hypertension ([place])   . Sleep apnea   . Tricuspid regurgitation     Family History: Mother had an aneurysm. Daughter also has allergic asthma. No other history of lung disease in the family.   Social History: Patient has never smoked, drinks alcohol very rarely, and doesn't use illicit drugs.  Melissa Adams recently moved to [place] from [place] and is living with Melissa Adams daughter who is Melissa Adams POA. Melissa Adams is generally independent, performing ADL's.  Review of Systems: Complete ROS was negative except as per HPI.  Physical Exam: Blood pressure (!) 96/48, pulse (!) 127, temperature 98.8 F (37.1 C), temperature source Oral, resp. rate (!) 26, height 5\' 3"  (1.6 m), weight 113.4 kg, SpO2 98 %. General: Patient appears well, in NAD. Patient is obese.  Eyes: Sclera non-icteric. No conjunctival injection.  Respiratory: Lung sounds diminished on the right with crackles at the bases. There is end expiratory wheezing most prominent on the left.  Cardiovascular: Rhythm is irregularly irregular. Rate is borderline tachycardic. No murmurs, gallops, or rubs appreciated. There is 1+ pitting edema of bilateral lower extremities. Distal pulses intact in all four extremities. Abdominal: Abdomen is obese and mildly distended but with intact bowel sounds. There is mild tenderness to palpation of the epigastric region with no tenderness to palpation in other quadrants. No masses palpated. No rebound  or guarding.  Musculoskeletal: Active ROM of all extremities.  Neurological: Gross motor strength intact. Sensation intact in bilateral lower extremities.  Skin: No lesions or rashes noted. No jaundice. Psych: Patient is pleasant with normal affect and normal tone.   EKG: personally reviewed my interpretation is Afib with regular rate and occasional PVC's.  CXR: personally reviewed my interpretation is  FINDINGS: Again noted is marked cardiomegaly. There is blunting of the right costophrenic angle likely there remains a right  effusion with hazy airspace opacity at the right lung base which could be layering effusion or atelectasis. The left lung is clear. Aortic calcifications are seen. No acute osseous abnormality.  IMPRESSION: Stable cardiomegaly. Right pleural effusion and hazy layering effusions/atelectasis. Electronically Signed   By: Prudencio Pair M.D.   On: 06/10/2020 16:58  CT Chest w/o Contrast: Findings: Evaluation of this exam is limited in the absence of intravenous contrast.  Cardiovascular: Moderate cardiomegaly. No pericardial effusion. There is atherosclerotic calcification of the aorta. Mild dilatation of the main pulmonary trunk suggestive of a degree of pulmonary hypertension. Clinical correlation is recommended.  Mediastinum/Nodes: There is no hilar or mediastinal adenopathy. The esophagus and the thyroid gland are grossly unremarkable. No mediastinal fluid collection.  Lungs/Pleura: There is a moderate right pleural effusion with compressive atelectasis of the majority of the right lower and middle lobes. Pneumonia is not excluded clinical correlation and follow-up recommended. There is subsegmental atelectasis in the lingula. Mild bronchiectasis. There is no pneumothorax. The central airways are patent.  Upper Abdomen: Slight irregular appearance of the liver contour concerning for changes of cirrhosis. Multiple stones noted within the gallbladder. There is partially visualized upper abdominal ascites. Indeterminate 15 mm nodule in the left adrenal gland.  Musculoskeletal: There is diffuse subcutaneous edema and anasarca. Osteopenia with degenerative changes of the spine. No acute osseous pathology.  IMPRESSION: 1. Moderate right pleural effusion with compressive atelectasis of the majority of the right lower and middle lobes. Pneumonia is not excluded clinical correlation and follow-up recommended. 2. Moderate cardiomegaly. 3. Cholelithiasis. 4. Cirrhosis with  partially visualized upper abdominal ascites. 5. Aortic Atherosclerosis (ICD10-I70.0). Electronically Signed   By: Anner Crete M.D.   On: 06/10/2020 19:30  Assessment & Plan by Problem: Active Problems:   * No active hospital problems. *  Vyctoria Dickman is a 74 y.o. F being admitted for right pleural effusion secondary to acute on chronic heart failure exacerbation vs. PNA.  1. Moderate right pleural effusion A: Observed on CT without contrast in addition to compressive atelectasis of the RLL and RML. Patient with symptoms of productive cough, orthopnea, exertional SOB and bilateral pitting edema of the LE's and decreased breath sounds on the right. SARS CoV-2 RT PCR negative. May be 2/2 cirrhosis (suggested on non-contrast CT 06/10/20) vs. Nephrosis (anasarca noted on CT 06/10/20) vs. acute on chronic CHF exacerbation vs. PNA. Patient had thoracentesis 05/05/20 and 1.12L were removed although LDH and protein content were not assessed.  Follow-up CXR 05/05/20 showed improvement in effusion though with airspace opacity consistent with RLL PNA. Melissa Adams was prescribed Doxycycline. Today, serum total protein is 6.7 and LDH is 295.  P: - IR consulted for US-guided thoracentesis - Will check fluid protein, fluid LDH, cell count w/ diff, fluid culture, cytology, gram stain, and glucose to distinguish whether effusion is exudative or transudative - Given azithromycin 500mg  IV x 1 - Given ceftriaxone 1g IV x 1  - Will check PT/INR and APTT - Will give tylenol 650mg  q6hrs PRN for  pain or fever >/= 101  2. Persistent Afib  A: Patient takes Apixaban 5mg  BID and Toprol-XL 37.5mg  BID. EKG shows Afib at regular rate with occasional PVC's.   P: - Continue Apixaban 5mg  BID - Continue Toprol-XL 37.5mg  BID - Continue on telemetry   3. Acute on chronic CHF exacerbation, HFrEF (30-35%) A: Orthopnea, exertional SOB and productive cough may be 2/2 CHF exacerbation. BNP 3465.4. Initial Troponin I 26, second  troponin 20. Last ECHO 04/20/20 demonstrated EF 30-35% with global hypokinesis.   P: - Started on fluid and salt restriction diet  - Continue home furosemide to 80mg  BID in hospital on non-dialysis days (TRSS) - Strict I&O monitoring  4. Stable ESRD on Hemodialysis MWF A: Creatinine 5.65, BUN 25. Patient has not missed a hemodialysis appointment but notes frequent hypotension during sessions.  P: - Started on renal diet (carb-modified with fluid restriction) - Continue hemodialysis (with midodrine 10mg ) MWF - Consult nephrology for scheduling in the morning - Will get morning BMP to monitor creatinine and electrolytes - Continue cinacalcet 30mg  daily  5. Stable Hypotension A: Blood pressures as low as 81/40 in the ED. Patient states Melissa Adams is on midodrine 10mg  on dialysis days (MWF) for hypotension. BP at time of admission stable with SBP 113.  P: -  Continue routine monitoring with target MAP > 65 - Continue midodrine 10mg  prior to dialysis (MWF)  6. OSA on CPAP - will continue home CPAP nightly.  7. Stable Allergic Asthma A: Patient currently has SOB and productive cough with end-expiratory wheezing most prominent in the left lung on exam.  P:  - Continue montelukast 10mg  daily - Continue Loratadine 10mg  daily.  - Give Proventil nebulizer q6 hrs PRN for wheezing or SOB  8. Mild thrombocytopenia A: Platelet count is 127. Previous platelet count 05/05/20 of 152. Patient does not report history of bleeding and has no petechiae on examination. CT Chest without contrast 06/10/20 shows slight irregular appearance of the liver contour concerning for changes of cirrhosis (with ascites) which is likely cause of thrombocytopenia and may be contributing to R-sided pleural effusion. Hep B sAg and antibody 10/26/19 were negative.  P: - Continue monitoring for petechiae or bruising on physical exam - Screen for hepatitis C with Hep C Antibody  9. Hx of normocytic anemia, currently with  macrocytosis A: 06/10/20 CBC shows hemoglobin 12.1 with MCV 103.4. Macrocytosis likely secondary to liver disease given current CT findings suggestive of cirrhosis.   P - monitor morning CBC  10. Hx of gout, currently in remission - continue home allopurinol   11. Asymptomatic Cholelithiasis A: Noncontrast CT scan showed multiple stones noted within the gallbladder. Patient is afebrile without RUQ pain/tenderness, without nausea.   P: - monitor with physical exam. No surgery indicated at this time.  12. Hypocalcemia A: Calcium 06/10/20 of 7.3 which does not adjust with albumin of 3.3. Last ionized calcium 05/23/20 0.85. Likely secondary to ESRD.  P: - Continue cinacalcet 30mg  daily - Will check ionized calcium - If ionized calcium is low, will replete   Dispo: Admit patient to Inpatient with expected length of stay greater than 2 midnights.  Signed: Jeralyn Bennett, MD 06/10/2020, 9:15 PM  Pager: @[email or web address] @ After 5pm on weekdays and 1pm on weekends: On Call pager: [phone]

## 2020-06-10 NOTE — ED Triage Notes (Signed)
The pt is c/o sob and chest pain for the past several days she does not have home 02  Dialysis pt last dialyzed Friday  Fistula  Lt arm

## 2020-06-10 NOTE — ED Provider Notes (Addendum)
[facility] EMERGENCY DEPARTMENT Provider Note   CSN: [id number] Arrival date & time: 06/10/20  1554     History Chief Complaint  Patient presents with  . Shortness of Breath    Melissa Adams is a 74 y.o. female.  HPI Patient is a 74 year old female with a past medical history of A. 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  Patient states that she has been having worsening pain on exertion for the past 2-3 days. She states that she has been having some abd pain as well however she states that it is more upper abdomen and epigastric area. She denies any radiation of this pain. States that she has felt somewhat lightheaded. She takes 40 mg of Lasix every day that she does not have dialysis. She states that intermittent and not exertional or pleuritic. She states she has been coughing for approximately 3 days which she states was productive.  She denies any fevers or chills.  Denies any nausea, vomiting, arm pain, syncope.  She states that she is normally able to walk feels that she has been able to walk only 3 steps before getting short of breath.  Patient states she is uncertain of any weight gain.  She states that her legs are swollen but no more than usual.  She states that she has proper self up on 2 pillows for many years has had no change.  She states that she has been waking up feeling more short of breath however.   On my review of EMR patient had coronary artery disease on 08/05/2019 which showed clean no significant atherosclerotic disease.    Past Medical History:  Diagnosis Date  . A-fib ([place])   . Allergies   . Arthritis   . Cardiomyopathy ([place])   . CHF (congestive heart failure) ([place])   . Chronic kidney disease   . Edema   . History of hemodialysis   . Hypertension   . Pulmonary hypertension ([place])   . Sleep apnea   . Tricuspid regurgitation      Patient Active Problem List   Diagnosis Date Noted  . ESRD on dialysis ([place]) 01/11/2020  . Acute on chronic systolic heart failure ([place])   . Pressure injury of skin 10/22/2019  . Dyspnea   . COVID-19 virus infection   . CHF exacerbation ([place]) 10/12/2019  . Acute CHF (congestive heart failure) ([place]) 10/11/2019  . Atrial fibrillation, chronic ([place]) 10/11/2019  . Essential hypertension 10/11/2019  . Sleep apnea 10/11/2019  . AKI (acute kidney injury) ([employer]) 10/11/2019  . Gout 10/11/2019  . Normocytic anemia 10/11/2019    Past Surgical History:  Procedure Laterality Date  . A/V FISTULAGRAM Left 05/23/2020   Procedure: A/V FISTULAGRAM;  Surgeon: [name], MD;  Location: [facility];  Service: Cardiovascular;  Laterality: Left;  . ABDOMINAL HYSTERECTOMY    . AV FISTULA PLACEMENT Left 11/04/2019   Procedure: Insertion Of Arteriovenous (Av) Gore-Tex Graft Arm;  Surgeon: [name], MD;  Location: [facility];  Service: Vascular;  Laterality: Left;  . CARDIAC CATHETERIZATION    . CARDIOVERSION N/A 10/26/2019   Procedure: CARDIOVERSION;  Surgeon: [name], MD;  Location: [facility] ENDOSCOPY;  Service: Cardiovascular;  Laterality: N/A;  . COLONOSCOPY W/ BIOPSIES AND POLYPECTOMY    . FISTULA SUPERFICIALIZATION Left 01/21/2020   Procedure: FISTULA SUPERFICIALIZATION OF FISTULA;  Surgeon: [name], MD;  Location: [place];  Service: Vascular;  Laterality: Left;  . INSERTION OF DIALYSIS CATHETER Right 11/04/2019   Procedure: CONVERT TEMPORARY DIALYSIS CATHETER TO TUNNELED DIALYSIS CATHETER Right Internal Jugular.;  Surgeon: [name], MD;  Location: [facility];  Service: Vascular;  Laterality: Right;  . MULTIPLE TOOTH EXTRACTIONS    . REVISION OF ARTERIOVENOUS GORETEX GRAFT Left 01/21/2020   Procedure: REVISION OF ARTERIOVENOUS FISTULA WITH SIDE BRANCH LIGATION;  Surgeon: [name], MD;  Location: [place];  Service: Vascular;  Laterality: Left;  . TEE  WITHOUT CARDIOVERSION N/A 10/26/2019   Procedure: TRANSESOPHAGEAL ECHOCARDIOGRAM (TEE);  Surgeon: [name], MD;  Location: [facility] ENDOSCOPY;  Service: Cardiovascular;  Laterality: N/A;  . THORACENTESIS       OB History   No obstetric history on file.     Family History  Problem Relation Age of Onset  . Seizures Father   . CAD Father   . Diabetes Sister   . Lupus Sister   . Hypertension Sister     Social History   Tobacco Use  . Smoking status: Former Smoker    Types: Cigarettes  . Smokeless tobacco: Never Used  Vaping Use  . Vaping Use: Never used  Substance Use Topics  . Alcohol use: Not Currently  . Drug use: Not Currently    Home Medications Prior to Admission medications   Medication Sig Start Date End Date Taking? Authorizing Provider  acetaminophen (TYLENOL) 325 MG tablet Take 2 tablets (650 mg total) by mouth every 4 (four) hours as needed for headache or mild pain. 11/05/19  Yes Buriev, Arie Sabina, MD  albuterol (VENTOLIN HFA) 108 (90 Base) MCG/ACT inhaler Inhale 2 puffs into the lungs every 6 (six) hours as needed for wheezing or shortness of breath. 11/30/19  Yes [name], NP  allopurinol (ZYLOPRIM) 300 MG tablet Take 1 tablet (300 mg total) by mouth daily. Patient taking differently: Take 300 mg by mouth at bedtime.  11/30/19  Yes [name], NP  apixaban (ELIQUIS) 5 MG TABS tablet Take 1 tablet (5 mg total) by mouth 2 (two) times daily. 12/15/19  Yes [name], MD  atorvastatin (LIPITOR) 40 MG tablet Take 1 tablet (40 mg total) by mouth daily. Patient taking differently: Take 40 mg by mouth at bedtime.  11/30/19  Yes [name], NP  B Complex-C-Folic Acid (DIALYVITE 086) 0.8 MG TABS Take 1 tablet by mouth daily.   Yes [provider]  cetirizine (ZYRTEC) 10 MG tablet Take 10 mg by mouth daily as needed for allergies.   Yes [provider]  cinacalcet (SENSIPAR) 30 MG tablet Take 30 mg by mouth daily with supper.   04/18/20  Yes [provider]  ferric citrate (AURYXIA) 1 GM 210 MG(Fe) tablet Take 210 mg by mouth 3 (three) times daily with meals.   Yes [provider]  furosemide (LASIX) 80 MG tablet Take 80 mg by mouth See admin instructions. Take 1 tablet (80 mg) by mouth twice daily on non-dialysis days - Sunday, Tuesday, Thursday, Saturday   Yes [provider]  lidocaine-prilocaine (EMLA) cream Apply 1 application topically See admin instructions. Apply topically to port access one hour prior to dialysis - Monday, Wednesday, Friday 04/07/20  Yes [provider]  metoprolol succinate (TOPROL-XL) 25 MG 24 hr tablet Take 1.5 tablets (37.5 mg total) by mouth 2 (two) times daily. Patient taking differently: Take 37.5 mg by mouth in the morning and at bedtime.  12/23/19  Yes [name]  S, MD  midodrine (PROAMATINE) 10 MG tablet Take 10 mg by mouth See admin instructions. Take one tablet (10 mg) by mouth on Monday, Wednesday, Friday prior to dialysis 02/10/20  Yes [provider]  montelukast (SINGULAIR) 10 MG tablet Take 1 tablet (10 mg total) by mouth daily. Patient taking differently: Take 10 mg by mouth at bedtime.  11/30/19  Yes [name], NP  vitamin C (VITAMIN C) 500 MG tablet Take 1 tablet (500 mg total) by mouth daily. 11/06/19  Yes Kinnie Feil, MD    Allergies    Black walnut flavor and Shellfish allergy  Review of Systems   Review of Systems  Constitutional: Positive for fatigue. Negative for chills and fever.  HENT: Negative for congestion.   Eyes: Negative for pain.  Respiratory: Positive for cough and chest tightness. Negative for shortness of breath.   Cardiovascular: Positive for chest pain. Negative for leg swelling.  Gastrointestinal: Positive for abdominal pain. Negative for diarrhea, nausea and vomiting.  Genitourinary: Negative for dysuria.  Musculoskeletal: Negative for myalgias.  Skin: Negative for rash.  Neurological:  Negative for dizziness and headaches.    Physical Exam Updated Vital Signs BP 95/73   Pulse 86   Temp 98.8 F (37.1 C) (Oral)   Resp 17   Ht '5\' 3"'  (1.6 m)   Wt 113.4 kg   SpO2 100%   BMI 44.29 kg/m   Physical Exam Vitals and nursing note reviewed.  Constitutional:      General: She is not in acute distress.    Appearance: She is obese.  HENT:     Head: Normocephalic and atraumatic.     Nose: Nose normal.     Mouth/Throat:     Mouth: Mucous membranes are moist.  Eyes:     General: No scleral icterus. Cardiovascular:     Rate and Rhythm: Normal rate. Rhythm irregular.     Pulses: Normal pulses.     Heart sounds: Normal heart sounds.  Pulmonary:     Effort: Pulmonary effort is normal. No respiratory distress.     Breath sounds: Rales present. No wheezing.     Comments: Crackles auscultated in bilateral bases.  Decreased lung sounds in R lower lung field. Mild tachypnea rate of 24 No increased work of breathing.  No accessory muscle usage Abdominal:     Palpations: Abdomen is soft.     Tenderness: There is no abdominal tenderness. There is no guarding or rebound.  Musculoskeletal:     Cervical back: Normal range of motion.     Right lower leg: Edema present.     Left lower leg: Edema present.     Comments: 2+ pitting edema to the knee.  Skin:    General: Skin is warm and dry.     Capillary Refill: Capillary refill takes less than 2 seconds.  Neurological:     Mental Status: She is alert. Mental status is at baseline.  Psychiatric:        Mood and Affect: Mood normal.        Behavior: Behavior normal.     ED Results / Procedures / Treatments   Labs (all labs ordered are listed, but only abnormal results are displayed) Labs Reviewed  CBC - Abnormal; Notable for the following components:      Result Value   RBC 3.81 (*)    MCV 103.4 (*)    RDW 16.9 (*)    Platelets 127 (*)    All other components  within normal limits  COMPREHENSIVE METABOLIC PANEL -  Abnormal; Notable for the following components:   Chloride 95 (*)    BUN 25 (*)    Creatinine, Ser 5.65 (*)    Calcium 7.3 (*)    Albumin 3.3 (*)    AST 43 (*)    Alkaline Phosphatase 275 (*)    Total Bilirubin 2.1 (*)    GFR calc non Af Amer 7 (*)    GFR calc Af Amer 8 (*)    All other components within normal limits  BRAIN NATRIURETIC PEPTIDE - Abnormal; Notable for the following components:   B Natriuretic Peptide 3,465.4 (*)    All other components within normal limits  LACTATE DEHYDROGENASE - Abnormal; Notable for the following components:   LDH 295 (*)    All other components within normal limits  TROPONIN I (HIGH SENSITIVITY) - Abnormal; Notable for the following components:   Troponin I (High Sensitivity) 20 (*)    All other components within normal limits  TROPONIN I (HIGH SENSITIVITY) - Abnormal; Notable for the following components:   Troponin I (High Sensitivity) 26 (*)    All other components within normal limits  SARS CORONAVIRUS 2 BY RT PCR (HOSPITAL ORDER, [facility])  LIPASE, BLOOD  BASIC METABOLIC PANEL  CBC  PROTIME-INR  APTT  HEPATITIS C ANTIBODY    EKG None  Radiology CT Chest Wo Contrast  Result Date: 06/10/2020 CLINICAL DATA:  74 year old female with shortness of breath. EXAM: CT CHEST WITHOUT CONTRAST TECHNIQUE: Multidetector CT imaging of the chest was performed following the standard protocol without IV contrast. COMPARISON:  Chest radiograph dated 06/10/2020. FINDINGS: Evaluation of this exam is limited in the absence of intravenous contrast. Cardiovascular: Moderate cardiomegaly. No pericardial effusion. There is atherosclerotic calcification of the aorta. Mild dilatation of the main pulmonary trunk suggestive of a degree of pulmonary hypertension. Clinical correlation is recommended. Mediastinum/Nodes: There is no hilar or mediastinal adenopathy. The esophagus and the thyroid gland are grossly unremarkable. No mediastinal  fluid collection. Lungs/Pleura: There is a moderate right pleural effusion with compressive atelectasis of the majority of the right lower and middle lobes. Pneumonia is not excluded clinical correlation and follow-up recommended. There is subsegmental atelectasis in the lingula. Mild bronchiectasis. There is no pneumothorax. The central airways are patent. Upper Abdomen: Slight irregular appearance of the liver contour concerning for changes of cirrhosis. Multiple stones noted within the gallbladder. There is partially visualized upper abdominal ascites. Indeterminate 15 mm nodule in the left adrenal gland. Musculoskeletal: There is diffuse subcutaneous edema and anasarca. Osteopenia with degenerative changes of the spine. No acute osseous pathology. IMPRESSION: 1. Moderate right pleural effusion with compressive atelectasis of the majority of the right lower and middle lobes. Pneumonia is not excluded clinical correlation and follow-up recommended. 2. Moderate cardiomegaly. 3. Cholelithiasis. 4. Cirrhosis with partially visualized upper abdominal ascites. 5. Aortic Atherosclerosis (ICD10-I70.0). Electronically Signed   By: Anner Crete M.D.   On: 06/10/2020 19:30   DG Chest Portable 1 View  Result Date: 06/10/2020 CLINICAL DATA:  Shortness of breath EXAM: PORTABLE CHEST 1 VIEW COMPARISON:  May 06, 2020 FINDINGS: Again noted is marked cardiomegaly. There is blunting of the right costophrenic angle likely there remains a right effusion with hazy airspace opacity at the right lung base which could be layering effusion or atelectasis. The left lung is clear. Aortic calcifications are seen. No acute osseous abnormality. IMPRESSION: Stable cardiomegaly. Right pleural effusion and hazy layering effusions/atelectasis. Electronically  Signed   By: Prudencio Pair M.D.   On: 06/10/2020 16:58    Procedures .Critical Care Performed by: Tedd Sias, PA Authorized by: Tedd Sias, PA   Critical care  provider statement:    Critical care time (minutes):  45   Critical care time was exclusive of:  Separately billable procedures and treating other patients and teaching time   Critical care was necessary to treat or prevent imminent or life-threatening deterioration of the following conditions:  Respiratory failure   Critical care was time spent personally by me on the following activities:  Discussions with consultants, evaluation of patient's response to treatment, examination of patient, review of old charts, re-evaluation of patient's condition, pulse oximetry, ordering and review of radiographic studies, ordering and review of laboratory studies and ordering and performing treatments and interventions   I assumed direction of critical care for this patient from another provider in my specialty: no     (including critical care time)  Medications Ordered in ED Medications  acetaminophen (TYLENOL) tablet 650 mg (has no administration in time range)    Or  acetaminophen (TYLENOL) suppository 650 mg (has no administration in time range)  polyethylene glycol (MIRALAX / GLYCOLAX) packet 17 g (has no administration in time range)  allopurinol (ZYLOPRIM) tablet 300 mg (has no administration in time range)  apixaban (ELIQUIS) tablet 5 mg (has no administration in time range)  atorvastatin (LIPITOR) tablet 40 mg (has no administration in time range)  loratadine (CLARITIN) tablet 10 mg (has no administration in time range)  cinacalcet (SENSIPAR) tablet 30 mg (has no administration in time range)  furosemide (LASIX) tablet 80 mg (has no administration in time range)  metoprolol succinate (TOPROL-XL) 24 hr tablet 37.5 mg (has no administration in time range)  montelukast (SINGULAIR) tablet 10 mg (has no administration in time range)  ascorbic acid (VITAMIN C) tablet 500 mg (has no administration in time range)  albuterol (PROVENTIL) (2.5 MG/3ML) 0.083% nebulizer solution 3 mL (has no administration in  time range)  sodium chloride flush (NS) 0.9 % injection 3 mL (3 mLs Intravenous Given 06/10/20 2049)  cefTRIAXone (ROCEPHIN) 1 g in sodium chloride 0.9 % 100 mL IVPB (0 g Intravenous Stopped 06/10/20 2041)  azithromycin (ZITHROMAX) 500 mg in sodium chloride 0.9 % 250 mL IVPB (0 mg Intravenous Stopped 06/10/20 2148)  furosemide (LASIX) injection 40 mg (40 mg Intravenous Given 06/10/20 2005)    ED Course  I have reviewed the triage vital signs and the nursing notes.  Pertinent labs & imaging results that were available during my care of the patient were reviewed by me and considered in my medical decision making (see chart for details).  Patient is 74 year old female with past medical history detailed above.  She is on anticoagulation chronically for her A. fib.  She has CHF as well.  EF of 30.  Physical exam is notable for significantly decreased lung sounds in the right side.  She has bibasilar crackles.  Concern for recurrence pleural effusion.  Her blood pressures are soft but she is mentating well and states that her blood pressures are typically soft.  I reviewed EMR it appears that they have been low in prior hospital visits.  Patient has been compliant with her dialysis Monday Wednesday Friday.  Her last dialysis appointment yesterday and she had full session.  EKG reviewed by myself and my attending physician.  No acute abnormalities.  Her Covid swab is negative.  Troponin x2  stable no significant  increase.  BNP is approximately the patient's baseline but somewhat mildly elevated.  Will provide patient with 1 dose of Lasix here given that she has pleural effusion and crackles and evidence of fluid overload with bilateral lower extremity edema.  CBC with out significant leukocytosis mild anemia.  No significant changes here.  Lipase within normal limits doubt pancreatitis.  Her alk phos and bilirubin are somewhat elevated as is her AST this appears to be a chronic condition as I have reviewed  her EMR and I doubt this is related to her symptoms today.  She does have low albumin which may be part of why she is having pleural effusion however it does appear to be unilateral which makes transudative less likely.  Chest x-ray shows stable cardiomegaly with right pleural effusion IMPRESSION:  Stable cardiomegaly. Right pleural effusion and hazy layering  effusions/atelectasis.    CT chest without contrast obtained to better visualize the pleural effusion and abnormalities noted on chest x-ray.  IMPRESSION:  1. Moderate right pleural effusion with compressive atelectasis of  the majority of the right lower and middle lobes. Pneumonia is not  excluded clinical correlation and follow-up recommended.  2. Moderate cardiomegaly.  3. Cholelithiasis.  4. Cirrhosis with partially visualized upper abdominal ascites.  5. Aortic Atherosclerosis (ICD10-I70.0).   I advised patient with antibiotics to cover possible Pneumonia as she does have productive cough and abnormal lung findings on CT scan.  We will also provide her with Lasix.  Clinical Course as of Jun 10 2318  Sat Jun 10, 2020  2008 Discussed with internal medicine teaching service who admit patient to their service.  IR consultation placed at request of admitting physician.  Will discuss the need for pleuracentesis.    [WF]    Clinical Course User Index [WF] Tedd Sias, [place]   I discussed this case with my attending physician who cosigned this note including patient's presenting symptoms, physical exam, and planned diagnostics and interventions. Attending physician stated agreement with plan or made changes to plan which were implemented.   Attending physician assessed patient at bedside.  Patient admitted to hospital service internal medicine teaching service.  I am concerned that this could be an exudative pleural effusion given that it is recurrent, unilateral and definitive testing was not conducted during last pleural  effusion removal by thoracentesis.  Unable to assess lites criteria.  Patient is severely symptomatic I suspect she would improve with thoracentesis and follow-up on results.  MDM Rules/Calculators/A&P                          Final Clinical Impression(s) / ED Diagnoses Final diagnoses:  Dyspnea  Pleural effusion  Congestive heart failure, unspecified HF chronicity, unspecified heart failure type [facility])    Rx / DC Orders ED Discharge Orders    None       Tedd Sias, [place] 06/11/20 0003    Tedd Sias, [place] 06/11/20 Lynnell Catalan    Veryl Speak, MD 06/11/20 1104

## 2020-06-11 ENCOUNTER — Inpatient Hospital Stay (HOSPITAL_COMMUNITY): Payer: Medicare HMO

## 2020-06-11 DIAGNOSIS — I700 Atherosclerosis of aorta: Secondary | ICD-10-CM | POA: Diagnosis present

## 2020-06-11 DIAGNOSIS — J90 Pleural effusion, not elsewhere classified: Secondary | ICD-10-CM

## 2020-06-11 LAB — BASIC METABOLIC PANEL
Anion gap: 13 (ref 5–15)
BUN: 28 mg/dL — ABNORMAL HIGH (ref 8–23)
CO2: 28 mmol/L (ref 22–32)
Calcium: 7.2 mg/dL — ABNORMAL LOW (ref 8.9–10.3)
Chloride: 96 mmol/L — ABNORMAL LOW (ref 98–111)
Creatinine, Ser: 6.16 mg/dL — ABNORMAL HIGH (ref 0.44–1.00)
GFR calc Af Amer: 7 mL/min — ABNORMAL LOW (ref >60)
GFR calc non Af Amer: 6 mL/min — ABNORMAL LOW (ref >60)
Glucose, Bld: 108 mg/dL — ABNORMAL HIGH (ref 70–99)
Potassium: 3.6 mmol/L (ref 3.5–5.1)
Sodium: 137 mmol/L (ref 135–145)

## 2020-06-11 LAB — GLUCOSE, PLEURAL OR PERITONEAL FLUID: Glucose, Fluid: 105 mg/dL

## 2020-06-11 LAB — CBC
HCT: 36.2 % (ref 36.0–46.0)
Hemoglobin: 11.2 g/dL — ABNORMAL LOW (ref 12.0–15.0)
MCH: 30.9 pg (ref 26.0–34.0)
MCHC: 30.9 g/dL (ref 30.0–36.0)
MCV: 100 fL (ref 80.0–100.0)
Platelets: 121 10*3/uL — ABNORMAL LOW (ref 150–400)
RBC: 3.62 MIL/uL — ABNORMAL LOW (ref 3.87–5.11)
RDW: 16.8 % — ABNORMAL HIGH (ref 11.5–15.5)
WBC: 8.2 10*3/uL (ref 4.0–10.5)
nRBC: 0 % (ref 0.0–0.2)

## 2020-06-11 LAB — PROTEIN, PLEURAL OR PERITONEAL FLUID: Total protein, fluid: 3.1 g/dL

## 2020-06-11 LAB — PROCALCITONIN: Procalcitonin: 0.39 ng/mL

## 2020-06-11 LAB — GRAM STAIN

## 2020-06-11 LAB — LACTATE DEHYDROGENASE, PLEURAL OR PERITONEAL FLUID: LD, Fluid: 135 U/L — ABNORMAL HIGH (ref 3–23)

## 2020-06-11 LAB — BODY FLUID CELL COUNT WITH DIFFERENTIAL
Eos, Fluid: 0 %
Lymphs, Fluid: 32 %
Monocyte-Macrophage-Serous Fluid: 42 % — ABNORMAL LOW (ref 50–90)
Neutrophil Count, Fluid: 26 % — ABNORMAL HIGH (ref 0–25)
Total Nucleated Cell Count, Fluid: 999 cu mm (ref 0–1000)

## 2020-06-11 LAB — VITAMIN B12: Vitamin B-12: 2326 pg/mL — ABNORMAL HIGH (ref 180–914)

## 2020-06-11 LAB — APTT: aPTT: 37 seconds — ABNORMAL HIGH (ref 24–36)

## 2020-06-11 LAB — PROTIME-INR
INR: 1.9 — ABNORMAL HIGH (ref 0.8–1.2)
Prothrombin Time: 20.8 seconds — ABNORMAL HIGH (ref 11.4–15.2)

## 2020-06-11 LAB — HEPATITIS C ANTIBODY: HCV Ab: NONREACTIVE

## 2020-06-11 LAB — MRSA PCR SCREENING: MRSA by PCR: NEGATIVE

## 2020-06-11 LAB — FOLATE: Folate: 78.4 ng/mL (ref >5.9)

## 2020-06-11 MED ORDER — MIDODRINE HCL 5 MG PO TABS
10.0000 mg | ORAL_TABLET | ORAL | Status: DC
  Administered 2020-06-12 – 2020-06-14 (×2): 10 mg via ORAL
  Filled 2020-06-11 (×3): qty 2

## 2020-06-11 MED ORDER — LIDOCAINE HCL (PF) 1 % IJ SOLN
INTRAMUSCULAR | Status: AC
  Filled 2020-06-11: qty 30

## 2020-06-11 MED ORDER — METOPROLOL SUCCINATE ER 25 MG PO TB24
25.0000 mg | ORAL_TABLET | Freq: Two times a day (BID) | ORAL | Status: DC
  Administered 2020-06-11 – 2020-06-13 (×4): 25 mg via ORAL
  Filled 2020-06-11 (×5): qty 1

## 2020-06-11 MED ORDER — CHLORHEXIDINE GLUCONATE CLOTH 2 % EX PADS
6.0000 | MEDICATED_PAD | Freq: Every day | CUTANEOUS | Status: DC
  Administered 2020-06-13: 6 via TOPICAL

## 2020-06-11 MED ORDER — AZITHROMYCIN 250 MG PO TABS
250.0000 mg | ORAL_TABLET | Freq: Every day | ORAL | Status: DC
  Administered 2020-06-11 – 2020-06-12 (×2): 250 mg via ORAL
  Filled 2020-06-11 (×2): qty 1

## 2020-06-11 MED ORDER — SODIUM CHLORIDE 0.9 % IV SOLN: 1.0000 g | INTRAVENOUS | Status: DC

## 2020-06-11 MED ORDER — SODIUM CHLORIDE 0.9 % IV SOLN: 500.0000 mg | INTRAVENOUS | Status: DC

## 2020-06-11 MED ORDER — SODIUM CHLORIDE 0.9 % IV SOLN
1.0000 g | INTRAVENOUS | Status: DC
  Administered 2020-06-11 – 2020-06-12 (×2): 1 g via INTRAVENOUS
  Filled 2020-06-11 (×2): qty 10

## 2020-06-11 NOTE — Procedures (Signed)
   US guided Rt thoracentesis  1.4 L bloody fluid Sent for labs per MD  Tolerated well CXR pending

## 2020-06-11 NOTE — Progress Notes (Signed)
No urine output today, pt is on PO Lasix and HD patient. Asked patient about urinating at home and offered bladder scanner. Patient stated that she is voiding only once at home on Lasix during the 24 hrs, she is oliguric. Will continue to monitor.

## 2020-06-11 NOTE — Progress Notes (Signed)
Subjective:   Hospital day: 1  Overnight event: Admitted  Ms. Melissa Adams is aware that she has pleural effusion and her scheduled thoracentesis. She also endorses a couple week history of bilateral lower extremity edema. She states that her last weight was last Friday which was "129kg or 122kg" but she is not quite sure of her dry weight.  She deferred all the questions to her daughter.  Objective:  Vital signs in last 24 hours: Vitals:   06/11/20 0910 06/11/20 0929 06/11/20 0951 06/11/20 1013  BP: (!) 97/57 (!) 75/39 96/74 (!) 97/50  Pulse:    93  Resp:    20  Temp:      TempSrc:      SpO2:    97%  Weight:      Height:        Physical Exam Physical Exam Vitals reviewed.  Cardiovascular:     Rate and Rhythm: Normal rate.     Heart sounds: No murmur heard.   Pulmonary:     Effort: No accessory muscle usage or respiratory distress.     Breath sounds: Examination of the left-lower field reveals rales. Rales present. No decreased breath sounds, wheezing or rhonchi.  Musculoskeletal:     Right lower leg: Edema (2+) present.     Left lower leg: Edema (2+) present.  Neurological:     Mental Status: She is alert.  Psychiatric:        Mood and Affect: Mood normal.        Behavior: Behavior normal.      Assessment/Plan: Melissa Adams is a 74 y.o. female with hx of ESRD on hemodialysis Monday, Wednesday, Friday, heart failure with reduced ejection fraction LVEF 30-35% on May 2021, chronic atrial fibrillation on Eliquis, obstructive sleep apnea, chronic normocytic anemia, asthma who presented with cough and shortness of breath found to have a recurrent right-sided pleural effusion.  Principal Problem:   Recurrent right pleural effusion Active Problems:   Atrial fibrillation, chronic (HCC)   Essential hypertension   Acute on chronic systolic heart failure (HCC)   ESRD on dialysis ([place])   Aortic atherosclerosis (HCC)  Recurrent right-sided pleural effusion: DDx includes  Parapneumonic infection vs cirrhosis vs HFrEF EF 30-35%.  Per chart review, she presented to the ED on 05/05/2020 with chest pain or shortness of breath for which she was found to have a moderate right-sided pleural effusion and subsequently had an IR guided thoracentesis yielding about 1.1 L of serosanguineous fluid.  During that visit, lights criteria was 0.52 suggesting an exudative effusion.  She presented to the ED a day later for follow-up and was found to have possible pneumonia for which she was discharged on doxycycline.  During this admission, CT chest showed recurrence of her right-sided pleural effusion.  She underwent IR guided thoracentesis which yielded about 1.4 L of fluid.  She does meet lights criteria as her pleural fluid LDH is greater than 3 times the upper limit of normal.  She does not appear to have been diagnosed with cirrhosis however CT chest reviewed liver contour irregularity suggestive of cirrhosis.  In addition elevated PT/PTT/INR and thrombocytopenia shows impaired synthetic liver function.  Negative hep B, hep C labs.  Her liver disease could be secondary to metabolic associated liver disease or cardio hepatic syndrome.  -Continue ceftriaxone (Day 1/5) and azithromycin (day 2/5) -Supplemental oxygen as needed -Closely monitor for signs of pneumothorax post thoracentesis -Chest x-ray as needed if decompensates -Follow-up abdominal ultrasound -Follow-up PT-INR daily -Monitor  LFTs daily   Acute on chronic heart failure with reduced ejection fraction: She has had chronically elevated BNP.  BNP during this admission was 3000 1465.  She is unsure of her dry weight.  She is status post IV Lasix 80 mg x 1 dose. -Nephrology consulted for volume removal -Continue home Lasix -Strict I's and O's -Daily weights -Monitor electrolytes closely   Chronic hypotension -Continue midodrine 10 mg MWF   Persistent atrial fibrillation -Continue apixaban 5 mg daily -Decrease Toprol XL  from 37.5 mg 25 mg twice daily -Continue telemetry monitoring   ESRD on HD MWF -Nephrology following and will dialyze inpatient -Continue cinacalcet   Macrocytic anemia: Likely secondary to hepatic disease.  Hemoglobin 11.2, MCV 100<<103.  Vitamin B12 elevated at 8891, folic acid unremarkable -Continue to monitor   Gout -Continue allopurinol   Asthma -Continue montelukast, loratadine and inhalers   Diet: Carb modified, renal IVF: None VTE: Eliquis CODE: Full code  Prior to Admission Living Arrangement: Unknown Anticipated Discharge Location: To be determined Barriers to Discharge: Ongoing medical care Dispo: Anticipated discharge in approximately 2-3 day(s).   Gaylan Gerold, DO 06/11/2020, 12:29 PM Pager: [phone] After 5pm on weekdays and 1pm on weekends: On Call pager [phone]

## 2020-06-11 NOTE — Consult Note (Signed)
[facility] Renal Consultation Note    Indication for Consultation:  Management of ESRD/hemodialysis; anemia, hypertension/volume and secondary hyperparathyroidism  HPI: Melissa Adams is a 74 y.o. female with ESRD on HD MWF at [facility]. HD started in 10/2019.  PMH also significant for HTN, chronic AFib on Eliquis, HLD, CHF, NICM EF 30-35%, Tricuspid regurg, OSA, COVID-19 infx 09/2019.  She presented to the ED with productive cough and worsening SOB. Moderate right pleural effusion on CXR/CT chest and she was admitted with recurrent right pleural effusion and possible PNA. Antibiotics started and she underwent thoracentesis this am with 1.4L removed.   Last dialysis was Friday. She completed a full treatment and left slightly below her dry weight. Dialysis via LUE AVF. Noted complaining of SOB since last month and her dry weight has been gradually lowered. Seen and examined at bedside, she does feel some improvement in breathing, but still coughing. Endorses LE edema. Denies f,c, cp, n/v,d.   Past Medical History:  Diagnosis Date  . A-fib ([place])   . Allergies   . Arthritis   . Cardiomyopathy ([place])   . CHF (congestive heart failure) ([place])   . Chronic kidney disease   . Edema   . History of hemodialysis   . Hypertension   . Pulmonary hypertension ([place])   . Sleep apnea   . Tricuspid regurgitation    Past Surgical History:  Procedure Laterality Date  . A/V FISTULAGRAM Left 05/23/2020   Procedure: A/V FISTULAGRAM;  Surgeon: [name], MD;  Location: [place] CV LAB;  Service: Cardiovascular;  Laterality: Left;  . ABDOMINAL HYSTERECTOMY    . AV FISTULA PLACEMENT Left 11/04/2019   Procedure: Insertion Of Arteriovenous (Av) Gore-Tex Graft Arm;  Surgeon: [name], MD;  Location: [place];  Service: Vascular;  Laterality: Left;  . CARDIAC CATHETERIZATION    . CARDIOVERSION N/A 10/26/2019   Procedure: CARDIOVERSION;  Surgeon: [name], MD;  Location: [facility] ENDOSCOPY;  Service: Cardiovascular;  Laterality: N/A;  . COLONOSCOPY W/ BIOPSIES AND POLYPECTOMY    . FISTULA SUPERFICIALIZATION Left 01/21/2020   Procedure: FISTULA SUPERFICIALIZATION OF FISTULA;  Surgeon: [name], MD;  Location: [place];  Service: Vascular;  Laterality: Left;  . INSERTION OF DIALYSIS CATHETER Right 11/04/2019   Procedure: CONVERT TEMPORARY DIALYSIS CATHETER TO TUNNELED DIALYSIS CATHETER Right Internal Jugular.;  Surgeon: [name], MD;  Location: [place];  Service: Vascular;  Laterality: Right;  . MULTIPLE TOOTH EXTRACTIONS    . REVISION OF ARTERIOVENOUS GORETEX GRAFT Left 01/21/2020   Procedure: REVISION OF ARTERIOVENOUS FISTULA WITH SIDE BRANCH LIGATION;  Surgeon: [name], MD;  Location: [place];  Service: Vascular;  Laterality: Left;  . TEE WITHOUT CARDIOVERSION N/A 10/26/2019   Procedure: TRANSESOPHAGEAL ECHOCARDIOGRAM (TEE);  Surgeon: [name], MD;  Location: [facility] ENDOSCOPY;  Service: Cardiovascular;  Laterality: N/A;  . THORACENTESIS     Family History  Problem Relation Age of Onset  . Seizures Father   . CAD Father   . Diabetes Sister   . Lupus Sister   . Hypertension Sister    Social History:  reports that she has quit smoking. Her smoking use included cigarettes. She has never used smokeless tobacco. She reports previous alcohol use. She reports previous drug use. Allergies  Allergen Reactions  . Black Walnut Flavor Anaphylaxis  . Shellfish Allergy Itching    Makes throat itch    Prior to Admission medications   Medication Sig Start Date End  Date Taking? Authorizing Provider  acetaminophen (TYLENOL) 325 MG tablet Take 2 tablets (650 mg total) by mouth every 4 (four) hours as needed for headache or mild pain. 11/05/19  Yes Buriev, Arie Sabina, MD  albuterol (VENTOLIN HFA) 108 (90 Base) MCG/ACT inhaler Inhale 2 puffs into the lungs every 6 (six) hours as needed for wheezing or shortness of breath.  11/30/19  Yes [name], NP  allopurinol (ZYLOPRIM) 300 MG tablet Take 1 tablet (300 mg total) by mouth daily. Patient taking differently: Take 300 mg by mouth at bedtime.  11/30/19  Yes [name], NP  apixaban (ELIQUIS) 5 MG TABS tablet Take 1 tablet (5 mg total) by mouth 2 (two) times daily. 12/15/19  Yes [name], MD  atorvastatin (LIPITOR) 40 MG tablet Take 1 tablet (40 mg total) by mouth daily. Patient taking differently: Take 40 mg by mouth at bedtime.  11/30/19  Yes [name], NP  B Complex-C-Folic Acid (DIALYVITE 701) 0.8 MG TABS Take 1 tablet by mouth daily.   Yes [provider]  cetirizine (ZYRTEC) 10 MG tablet Take 10 mg by mouth daily as needed for allergies.   Yes [provider]  cinacalcet (SENSIPAR) 30 MG tablet Take 30 mg by mouth daily with supper.  04/18/20  Yes [provider]  ferric citrate (AURYXIA) 1 GM 210 MG(Fe) tablet Take 210 mg by mouth 3 (three) times daily with meals.   Yes [provider]  furosemide (LASIX) 80 MG tablet Take 80 mg by mouth See admin instructions. Take 1 tablet (80 mg) by mouth twice daily on non-dialysis days - Sunday, Tuesday, Thursday, Saturday   Yes [provider]  lidocaine-prilocaine (EMLA) cream Apply 1 application topically See admin instructions. Apply topically to port access one hour prior to dialysis - Monday, Wednesday, Friday 04/07/20  Yes [provider]  metoprolol succinate (TOPROL-XL) 25 MG 24 hr tablet Take 1.5 tablets (37.5 mg total) by mouth 2 (two) times daily. Patient taking differently: Take 37.5 mg by mouth in the morning and at bedtime.  12/23/19  Yes [name], MD  midodrine (PROAMATINE) 10 MG tablet Take 10 mg by mouth See admin instructions. Take one tablet (10 mg) by mouth on Monday, Wednesday, Friday prior to dialysis 02/10/20  Yes [provider]  montelukast (SINGULAIR) 10 MG tablet Take 1 tablet (10 mg total) by mouth  daily. Patient taking differently: Take 10 mg by mouth at bedtime.  11/30/19  Yes [name], NP  vitamin C (VITAMIN C) 500 MG tablet Take 1 tablet (500 mg total) by mouth daily. 11/06/19  Yes Kinnie Feil, MD   Current Facility-Administered Medications  Medication Dose Route Frequency Provider Last Rate Last Admin  . acetaminophen (TYLENOL) tablet 650 mg  650 mg Oral Q6H PRN Maudie Mercury, MD       Or  . acetaminophen (TYLENOL) suppository 650 mg  650 mg Rectal Q6H PRN Maudie Mercury, MD      . albuterol (PROVENTIL) (2.5 MG/3ML) 0.083% nebulizer solution 3 mL  3 mL Inhalation Q6H PRN Maudie Mercury, MD      . allopurinol (ZYLOPRIM) tablet 300 mg  300 mg Oral Daily Maudie Mercury, MD   300 mg at 06/11/20 1014  . apixaban (ELIQUIS) tablet 5 mg  5 mg Oral BID Maudie Mercury, MD   5 mg at 06/11/20 1015  . ascorbic acid (VITAMIN C) tablet 500 mg  500 mg Oral Daily Maudie Mercury, MD  500 mg at 06/11/20 1014  . atorvastatin (LIPITOR) tablet 40 mg  40 mg Oral Daily Maudie Mercury, MD   40 mg at 06/11/20 1014  . Chlorhexidine Gluconate Cloth 2 % PADS 6 each  6 each Topical Daily Lucious Groves, DO      . cinacalcet (SENSIPAR) tablet 30 mg  30 mg Oral Q supper Maudie Mercury, MD      . furosemide (LASIX) tablet 80 mg  80 mg Oral 2 times per day on Sun Tue Thu Sat Maudie Mercury, MD   80 mg at 06/11/20 1015  . lidocaine (PF) (XYLOCAINE) 1 % injection           . loratadine (CLARITIN) tablet 10 mg  10 mg Oral Daily Maudie Mercury, MD   10 mg at 06/11/20 1014  . metoprolol succinate (TOPROL-XL) 24 hr tablet 37.5 mg  37.5 mg Oral BID Maudie Mercury, MD   37.5 mg at 06/11/20 1014  . montelukast (SINGULAIR) tablet 10 mg  10 mg Oral Daily Maudie Mercury, MD   10 mg at 06/11/20 1015  . polyethylene glycol (MIRALAX / GLYCOLAX) packet 17 g  17 g Oral Daily PRN Maudie Mercury, MD         ROS: As per HPI otherwise negative.  Physical Exam: Vitals:   06/11/20 0910 06/11/20 0929  06/11/20 0951 06/11/20 1013  BP: (!) 97/57 (!) 75/39 96/74 (!) 97/50  Pulse:    93  Resp:    20  Temp:      TempSrc:      SpO2:    97%  Weight:      Height:         General: Elderly woman on nasal oxygen, nad  Head: NCAT sclera not icteric  Neck: Supple. No JVD appreciated  Lungs:  Normal WOB. Bibasilar wheezing  Heart: RRR with S1 S2 Abdomen: soft non-tender, non-distended  Lower extremities: bilateral 2+ pitting edema to thighs  Neuro: A & O  X 3. Moves all extremities spontaneously. Psych:  Responds to questions appropriately with a normal affect. Dialysis Access: LUE AVF +bruit   Labs: Basic Metabolic Panel: Recent Labs  Lab 06/10/20 1650 06/11/20 0245  NA 139 137  K 3.7 3.6  CL 95* 96*  CO2 31 28  GLUCOSE 91 108*  BUN 25* 28*  CREATININE 5.65* 6.16*  CALCIUM 7.3* 7.2*   Liver Function Tests: Recent Labs  Lab 06/10/20 1650  AST 43*  ALT 43  ALKPHOS 275*  BILITOT 2.1*  PROT 6.7  ALBUMIN 3.3*   Recent Labs  Lab 06/10/20 1650  LIPASE 44   No results for input(s): AMMONIA in the last 168 hours. CBC: Recent Labs  Lab 06/10/20 1610 06/11/20 0245  WBC 7.4 8.2  HGB 12.1 11.2*  HCT 39.4 36.2  MCV 103.4* 100.0  PLT 127* 121*   Cardiac Enzymes: No results for input(s): CKTOTAL, CKMB, CKMBINDEX, TROPONINI in the last 168 hours. CBG: No results for input(s): GLUCAP in the last 168 hours. Iron Studies: No results for input(s): IRON, TIBC, TRANSFERRIN, FERRITIN in the last 72 hours. Studies/Results: DG Chest 1 View  Result Date: 06/11/2020 CLINICAL DATA:  Post thoracentesis EXAM: CHEST  1 VIEW COMPARISON:  06/10/2020 FINDINGS: Midline trachea. Moderate to marked enlargement of the cardiopericardial silhouette. Decrease in right-sided pleural effusion, tiny. No pneumothorax. No congestive failure. Improved right base airspace disease/atelectasis. There is likely mild left base subsegmental atelectasis. IMPRESSION: No pneumothorax after right-sided  thoracentesis. Markedly decreased right pleural fluid  with improved right base aeration. Cardiomegaly without congestive failure. Electronically Signed   By: Abigail Miyamoto M.D.   On: 06/11/2020 10:41   CT Chest Wo Contrast  Result Date: 06/10/2020 CLINICAL DATA:  74 year old female with shortness of breath. EXAM: CT CHEST WITHOUT CONTRAST TECHNIQUE: Multidetector CT imaging of the chest was performed following the standard protocol without IV contrast. COMPARISON:  Chest radiograph dated 06/10/2020. FINDINGS: Evaluation of this exam is limited in the absence of intravenous contrast. Cardiovascular: Moderate cardiomegaly. No pericardial effusion. There is atherosclerotic calcification of the aorta. Mild dilatation of the main pulmonary trunk suggestive of a degree of pulmonary hypertension. Clinical correlation is recommended. Mediastinum/Nodes: There is no hilar or mediastinal adenopathy. The esophagus and the thyroid gland are grossly unremarkable. No mediastinal fluid collection. Lungs/Pleura: There is a moderate right pleural effusion with compressive atelectasis of the majority of the right lower and middle lobes. Pneumonia is not excluded clinical correlation and follow-up recommended. There is subsegmental atelectasis in the lingula. Mild bronchiectasis. There is no pneumothorax. The central airways are patent. Upper Abdomen: Slight irregular appearance of the liver contour concerning for changes of cirrhosis. Multiple stones noted within the gallbladder. There is partially visualized upper abdominal ascites. Indeterminate 15 mm nodule in the left adrenal gland. Musculoskeletal: There is diffuse subcutaneous edema and anasarca. Osteopenia with degenerative changes of the spine. No acute osseous pathology. IMPRESSION: 1. Moderate right pleural effusion with compressive atelectasis of the majority of the right lower and middle lobes. Pneumonia is not excluded clinical correlation and follow-up recommended. 2.  Moderate cardiomegaly. 3. Cholelithiasis. 4. Cirrhosis with partially visualized upper abdominal ascites. 5. Aortic Atherosclerosis (ICD10-I70.0). Electronically Signed   By: Anner Crete M.D.   On: 06/10/2020 19:30   DG Chest Portable 1 View  Result Date: 06/10/2020 CLINICAL DATA:  Shortness of breath EXAM: PORTABLE CHEST 1 VIEW COMPARISON:  May 06, 2020 FINDINGS: Again noted is marked cardiomegaly. There is blunting of the right costophrenic angle likely there remains a right effusion with hazy airspace opacity at the right lung base which could be layering effusion or atelectasis. The left lung is clear. Aortic calcifications are seen. No acute osseous abnormality. IMPRESSION: Stable cardiomegaly. Right pleural effusion and hazy layering effusions/atelectasis. Electronically Signed   By: Prudencio Pair M.D.   On: 06/10/2020 16:58   US THORACENTESIS ASP PLEURAL SPACE W/IMG GUIDE  Result Date: 06/11/2020 INDICATION: Right pleural effusion EXAM: ULTRASOUND GUIDED right THORACENTESIS MEDICATIONS: 10 cc 1% lidocaine. COMPLICATIONS: None immediate. PROCEDURE: An ultrasound guided thoracentesis was thoroughly discussed with the patient and questions answered. The benefits, risks, alternatives and complications were also discussed. The patient understands and wishes to proceed with the procedure. Written consent was obtained. Ultrasound was performed to localize and mark an adequate pocket of fluid in the right chest. The area was then prepped and draped in the normal sterile fashion. 1% Lidocaine was used for local anesthesia. Under ultrasound guidance a 19 G Yueh catheter was introduced. Thoracentesis was performed. The catheter was removed and a dressing applied. FINDINGS: A total of approximately 1.4 Liters of bloody fluid fluid was removed. Samples were sent to the laboratory as requested by the clinical team. IMPRESSION: Successful ultrasound guided right thoracentesis yielding 1.4 L of pleural fluid.  Read by Lavonia Drafts [facility] Electronically Signed   By: Corrie Mckusick D.O.   On: 06/11/2020 09:36    Dialysis Orders:  AF MWF 4h 450/800 EDW 102.5kg 2K/2.25Ca UFP 2 L AVF Heparin 1000 Venofer 50 q  week   Assessment/Plan: 1. Dyspnea/Right pleural effusion - s/p thoracentesis this am 1.4L removed. CXR improved. Fluid studies pending. Antibiotics started for possible PNA -per primary.  2. ESRD -  HD MWF. No urgent dialysis needs today. Next HD Monday on schedule  3. Hypertension/volume  - Low BP. On midodrine for BP support. Volume excess on exam - will attempt to challenge dry weight next HD.  4. Anemia  - Hgb at goal. No ESA needs.  5. Metabolic bone disease -  Corr Ca low. Discontinue Sensipar for now. Use added Ca bath on HD.  6. Chronic Afib - on Eliquis 7. NICM EF 30-35%  - Hypervolemic. Volume removal with HD as tolerated.   Lynnda Child PA-C [facility] 06/11/2020, 11:53 AM

## 2020-06-12 ENCOUNTER — Inpatient Hospital Stay (HOSPITAL_COMMUNITY): Payer: Medicare HMO

## 2020-06-12 LAB — COMPREHENSIVE METABOLIC PANEL
ALT: 36 U/L (ref 0–44)
AST: 32 U/L (ref 15–41)
Albumin: 2.9 g/dL — ABNORMAL LOW (ref 3.5–5.0)
Alkaline Phosphatase: 235 U/L — ABNORMAL HIGH (ref 38–126)
Anion gap: 13 (ref 5–15)
BUN: 41 mg/dL — ABNORMAL HIGH (ref 8–23)
CO2: 26 mmol/L (ref 22–32)
Calcium: 7 mg/dL — ABNORMAL LOW (ref 8.9–10.3)
Chloride: 98 mmol/L (ref 98–111)
Creatinine, Ser: 7.85 mg/dL — ABNORMAL HIGH (ref 0.44–1.00)
GFR calc Af Amer: 5 mL/min — ABNORMAL LOW (ref >60)
GFR calc non Af Amer: 5 mL/min — ABNORMAL LOW (ref >60)
Glucose, Bld: 98 mg/dL (ref 70–99)
Potassium: 3.9 mmol/L (ref 3.5–5.1)
Sodium: 137 mmol/L (ref 135–145)
Total Bilirubin: 1.7 mg/dL — ABNORMAL HIGH (ref 0.3–1.2)
Total Protein: 6.1 g/dL — ABNORMAL LOW (ref 6.5–8.1)

## 2020-06-12 LAB — RENAL FUNCTION PANEL
Albumin: 2.7 g/dL — ABNORMAL LOW (ref 3.5–5.0)
Anion gap: 13 (ref 5–15)
BUN: 42 mg/dL — ABNORMAL HIGH (ref 8–23)
CO2: 27 mmol/L (ref 22–32)
Calcium: 7 mg/dL — ABNORMAL LOW (ref 8.9–10.3)
Chloride: 96 mmol/L — ABNORMAL LOW (ref 98–111)
Creatinine, Ser: 7.7 mg/dL — ABNORMAL HIGH (ref 0.44–1.00)
GFR calc Af Amer: 5 mL/min — ABNORMAL LOW (ref >60)
GFR calc non Af Amer: 5 mL/min — ABNORMAL LOW (ref >60)
Glucose, Bld: 106 mg/dL — ABNORMAL HIGH (ref 70–99)
Phosphorus: 5.5 mg/dL — ABNORMAL HIGH (ref 2.5–4.6)
Potassium: 3.9 mmol/L (ref 3.5–5.1)
Sodium: 136 mmol/L (ref 135–145)

## 2020-06-12 LAB — CBC
HCT: 34.7 % — ABNORMAL LOW (ref 36.0–46.0)
Hemoglobin: 10.6 g/dL — ABNORMAL LOW (ref 12.0–15.0)
MCH: 30.9 pg (ref 26.0–34.0)
MCHC: 30.5 g/dL (ref 30.0–36.0)
MCV: 101.2 fL — ABNORMAL HIGH (ref 80.0–100.0)
Platelets: 114 10*3/uL — ABNORMAL LOW (ref 150–400)
RBC: 3.43 MIL/uL — ABNORMAL LOW (ref 3.87–5.11)
RDW: 16.7 % — ABNORMAL HIGH (ref 11.5–15.5)
WBC: 5.9 10*3/uL (ref 4.0–10.5)
nRBC: 0 % (ref 0.0–0.2)

## 2020-06-12 LAB — PROTIME-INR
INR: 1.7 — ABNORMAL HIGH (ref 0.8–1.2)
Prothrombin Time: 19.7 seconds — ABNORMAL HIGH (ref 11.4–15.2)

## 2020-06-12 LAB — CALCIUM, IONIZED: Calcium, Ionized, Serum: 3.7 mg/dL — ABNORMAL LOW (ref 4.5–5.6)

## 2020-06-12 LAB — LACTATE DEHYDROGENASE: LDH: 194 U/L — ABNORMAL HIGH (ref 98–192)

## 2020-06-12 LAB — CYTOLOGY - NON PAP

## 2020-06-12 MED ORDER — HEPARIN SODIUM (PORCINE) 1000 UNIT/ML DIALYSIS
1000.0000 [IU] | INTRAMUSCULAR | Status: DC | PRN
  Filled 2020-06-12: qty 1

## 2020-06-12 MED ORDER — PENTAFLUOROPROP-TETRAFLUOROETH EX AERO: 1.0000 "application " | INHALATION_SPRAY | CUTANEOUS | Status: DC | PRN

## 2020-06-12 MED ORDER — ALTEPLASE 2 MG IJ SOLR: 2.0000 mg | Freq: Once | INTRAMUSCULAR | Status: DC | PRN

## 2020-06-12 MED ORDER — HEPARIN SODIUM (PORCINE) 1000 UNIT/ML DIALYSIS
20.0000 [IU]/kg | INTRAMUSCULAR | Status: DC | PRN
  Filled 2020-06-12: qty 3

## 2020-06-12 MED ORDER — SODIUM CHLORIDE 0.9 % IV SOLN: 100.0000 mL | INTRAVENOUS | Status: DC | PRN

## 2020-06-12 MED ORDER — MIDODRINE HCL 5 MG PO TABS
ORAL_TABLET | ORAL | Status: AC
  Administered 2020-06-12: 10 mg via ORAL
  Filled 2020-06-12: qty 2

## 2020-06-12 MED ORDER — ALBUMIN HUMAN 25 % IV SOLN
INTRAVENOUS | Status: AC
  Administered 2020-06-12: 25 g via INTRAVENOUS
  Filled 2020-06-12: qty 100

## 2020-06-12 MED ORDER — LIDOCAINE-PRILOCAINE 2.5-2.5 % EX CREA
1.0000 "application " | TOPICAL_CREAM | CUTANEOUS | Status: DC | PRN
  Filled 2020-06-12: qty 5

## 2020-06-12 MED ORDER — ALBUMIN HUMAN 25 % IV SOLN
25.0000 g | Freq: Once | INTRAVENOUS | Status: AC
  Filled 2020-06-12: qty 100

## 2020-06-12 MED ORDER — LIDOCAINE HCL (PF) 1 % IJ SOLN: 5.0000 mL | INTRAMUSCULAR | Status: DC | PRN

## 2020-06-12 MED ORDER — MIDODRINE HCL 5 MG PO TABS: 10.0000 mg | ORAL_TABLET | Freq: Once | ORAL | Status: AC

## 2020-06-12 NOTE — Progress Notes (Addendum)
[facility] Progress Note   Subjective: Awake, very pleasant. Has dry cough, audible wheezing. Denies SOB. HD today on schedule.     Objective Vitals:   06/12/20 0059 06/12/20 0102 06/12/20 0307 06/12/20 0852  BP: (!) 84/53 (!) 93/44 (!) 88/54 (!) 95/55  Pulse: 67 74 (!) 52 (!) 56  Resp:   17 20  Temp:   97.9 F (36.6 C)   TempSrc:   Oral   SpO2:   98% 93%  Weight:   102.4 kg   Height:       Physical Exam General: Pleasant elderly female in NAD Heart:S1,S2 RRR Lungs: Bilateral breath sounds with bibasilar fine crackles, scattering expiratory wheezes. No WOB.  Abdomen: S, NT Extremities: 2+ BLE pitting edema Dialysis Access: L AVF + bruit    Additional Objective Labs: Basic Metabolic Panel: Recent Labs  Lab 06/10/20 1650 06/11/20 0245 06/12/20 0512  NA 139 137 137  K 3.7 3.6 3.9  CL 95* 96* 98  CO2 31 28 26   GLUCOSE 91 108* 98  BUN 25* 28* 41*  CREATININE 5.65* 6.16* 7.85*  CALCIUM 7.3* 7.2* 7.0*   Liver Function Tests: Recent Labs  Lab 06/10/20 1650 06/12/20 0512  AST 43* 32  ALT 43 36  ALKPHOS 275* 235*  BILITOT 2.1* 1.7*  PROT 6.7 6.1*  ALBUMIN 3.3* 2.9*   Recent Labs  Lab 06/10/20 1650  LIPASE 44   CBC: Recent Labs  Lab 06/10/20 1610 06/11/20 0245 06/12/20 0512  WBC 7.4 8.2 5.9  HGB 12.1 11.2* 10.6*  HCT 39.4 36.2 34.7*  MCV 103.4* 100.0 101.2*  PLT 127* 121* 114*   Blood Culture    Component Value Date/Time   SDES PLEURAL FLUID 06/11/2020 0959   SPECREQUEST NONE 06/11/2020 0959   CULT  05/05/2020 1621    NO GROWTH 5 DAYS Performed at [facility], [place] [address], [place], [place] [address]    REPTSTATUS 06/11/2020 FINAL 06/11/2020 0959    Cardiac Enzymes: No results for input(s): CKTOTAL, CKMB, CKMBINDEX, TROPONINI in the last 168 hours. CBG: No results for input(s): GLUCAP in the last 168 hours. Iron Studies: No results for input(s): IRON, TIBC, TRANSFERRIN, FERRITIN in the last 72  hours. @lablastinr3 @ Studies/Results: DG Chest 1 View  Result Date: 06/11/2020 CLINICAL DATA:  Post thoracentesis EXAM: CHEST  1 VIEW COMPARISON:  06/10/2020 FINDINGS: Midline trachea. Moderate to marked enlargement of the cardiopericardial silhouette. Decrease in right-sided pleural effusion, tiny. No pneumothorax. No congestive failure. Improved right base airspace disease/atelectasis. There is likely mild left base subsegmental atelectasis. IMPRESSION: No pneumothorax after right-sided thoracentesis. Markedly decreased right pleural fluid with improved right base aeration. Cardiomegaly without congestive failure. Electronically Signed   By: Abigail Miyamoto M.D.   On: 06/11/2020 10:41   CT Chest Wo Contrast  Result Date: 06/10/2020 CLINICAL DATA:  74 year old female with shortness of breath. EXAM: CT CHEST WITHOUT CONTRAST TECHNIQUE: Multidetector CT imaging of the chest was performed following the standard protocol without IV contrast. COMPARISON:  Chest radiograph dated 06/10/2020. FINDINGS: Evaluation of this exam is limited in the absence of intravenous contrast. Cardiovascular: Moderate cardiomegaly. No pericardial effusion. There is atherosclerotic calcification of the aorta. Mild dilatation of the main pulmonary trunk suggestive of a degree of pulmonary hypertension. Clinical correlation is recommended. Mediastinum/Nodes: There is no hilar or mediastinal adenopathy. The esophagus and the thyroid gland are grossly unremarkable. No mediastinal fluid collection. Lungs/Pleura: There is a moderate right pleural effusion with compressive atelectasis of the majority  of the right lower and middle lobes. Pneumonia is not excluded clinical correlation and follow-up recommended. There is subsegmental atelectasis in the lingula. Mild bronchiectasis. There is no pneumothorax. The central airways are patent. Upper Abdomen: Slight irregular appearance of the liver contour concerning for changes of cirrhosis.  Multiple stones noted within the gallbladder. There is partially visualized upper abdominal ascites. Indeterminate 15 mm nodule in the left adrenal gland. Musculoskeletal: There is diffuse subcutaneous edema and anasarca. Osteopenia with degenerative changes of the spine. No acute osseous pathology. IMPRESSION: 1. Moderate right pleural effusion with compressive atelectasis of the majority of the right lower and middle lobes. Pneumonia is not excluded clinical correlation and follow-up recommended. 2. Moderate cardiomegaly. 3. Cholelithiasis. 4. Cirrhosis with partially visualized upper abdominal ascites. 5. Aortic Atherosclerosis (ICD10-I70.0). Electronically Signed   By: Anner Crete M.D.   On: 06/10/2020 19:30   DG Chest Portable 1 View  Result Date: 06/10/2020 CLINICAL DATA:  Shortness of breath EXAM: PORTABLE CHEST 1 VIEW COMPARISON:  May 06, 2020 FINDINGS: Again noted is marked cardiomegaly. There is blunting of the right costophrenic angle likely there remains a right effusion with hazy airspace opacity at the right lung base which could be layering effusion or atelectasis. The left lung is clear. Aortic calcifications are seen. No acute osseous abnormality. IMPRESSION: Stable cardiomegaly. Right pleural effusion and hazy layering effusions/atelectasis. Electronically Signed   By: Prudencio Pair M.D.   On: 06/10/2020 16:58   [place] ASCITES (ABDOMEN LIMITED)  Result Date: 06/11/2020 CLINICAL DATA:  Evaluation for ascites. EXAM: LIMITED ABDOMEN ULTRASOUND FOR ASCITES TECHNIQUE: Limited ultrasound survey for ascites was performed in all four abdominal quadrants. COMPARISON:  October 12, 2019, abdominal ultrasound FINDINGS: Limited evaluation of the 4 quadrants of the abdomen demonstrate small volume ascites in the lower quadrants. IMPRESSION: Small volume abdominal ascites. Electronically Signed   By: Fidela Salisbury M.D.   On: 06/11/2020 15:58   US THORACENTESIS ASP PLEURAL SPACE W/IMG  GUIDE  Result Date: 06/11/2020 INDICATION: Right pleural effusion EXAM: ULTRASOUND GUIDED right THORACENTESIS MEDICATIONS: 10 cc 1% lidocaine. COMPLICATIONS: None immediate. PROCEDURE: An ultrasound guided thoracentesis was thoroughly discussed with the patient and questions answered. The benefits, risks, alternatives and complications were also discussed. The patient understands and wishes to proceed with the procedure. Written consent was obtained. Ultrasound was performed to localize and mark an adequate pocket of fluid in the right chest. The area was then prepped and draped in the normal sterile fashion. 1% Lidocaine was used for local anesthesia. Under ultrasound guidance a 19 G Yueh catheter was introduced. Thoracentesis was performed. The catheter was removed and a dressing applied. FINDINGS: A total of approximately 1.4 Liters of bloody fluid fluid was removed. Samples were sent to the laboratory as requested by the clinical team. IMPRESSION: Successful ultrasound guided right thoracentesis yielding 1.4 L of pleural fluid. Read by Lavonia Drafts [facility] Electronically Signed   By: Corrie Mckusick D.O.   On: 06/11/2020 09:36   Medications: . cefTRIAXone (ROCEPHIN)  IV 1 g (06/11/20 2023)   . allopurinol  300 mg Oral Daily  . apixaban  5 mg Oral BID  . ascorbic acid  500 mg Oral Daily  . atorvastatin  40 mg Oral Daily  . azithromycin  250 mg Oral Daily  . Chlorhexidine Gluconate Cloth  6 each Topical Daily  . furosemide  80 mg Oral 2 times per day on Sun Tue Thu Sat  . loratadine  10 mg Oral Daily  . metoprolol  succinate  25 mg Oral BID  . midodrine  10 mg Oral Once per day on Mon Wed Fri  . montelukast  10 mg Oral Daily    Dialysis Orders:  AF MWF  4h 180NR3 450/800 EDW 102.5kg 2K/2.25Ca UFP 2 L AVF  -Heparin 1000 units IV TIW Venofer 50 mg IV q week   Assessment/Plan: 1. Dyspnea/Right pleural effusion - s/p thoracentesis 06/12/2020 1.4L removed. CXR improved. Fluid studies cloudy,  Neut-26 mono-42. Antibiotics started for possible PNA -per primary.  2. Chronic Systolic HF/NICM- EF 68-12% with mildly reduced RV function per ECHO 04/20/2020. Continue lowering volume as tolerated 3. ESRD -  HD MWF. HD today on schedule. 4.0 K bath, usual heparin.  4. Hypertension/volume  - Low BP. On midodrine for BP support. Volume excess on exam. Left under OP EDW last treatment 07/16. Continue lowering volume as tolerated. Continue PO furosemide on non HD days.  5. Anemia  - Hgb at goal. No ESA needs.  6. Metabolic bone disease -  Corr Ca low. Discontinue Sensipar for now. Use added Ca bath on HD.  7. Chronic Afib - on Eliquis  Corrie Brannen H. Serrina Minogue NP-C 06/12/2020, 9:25 AM  [employer] [phone]

## 2020-06-12 NOTE — Progress Notes (Signed)
Report given to HD nurse, made aware that patient is NPO for abdominal US.

## 2020-06-12 NOTE — Progress Notes (Signed)
°   06/12/20 1943  Assess: MEWS Score  Temp 98.3 F (36.8 C)  BP (!) 84/42  Pulse Rate (!) 132  Resp 16  Level of Consciousness Alert  SpO2 97 %  O2 Device Nasal Cannula  Assess: MEWS Score  MEWS Temp 0  MEWS Systolic 1  MEWS Pulse 3  MEWS RR 0  MEWS LOC 0  MEWS Score 4  MEWS Score Color Red  Assess: if the MEWS score is Yellow or Red  Were vital signs taken at a resting state? Yes  Focused Assessment No change from prior assessment  Early Detection of Sepsis Score *See Row Information* Low  MEWS guidelines implemented *See Row Information* No, vital signs rechecked  Treat  MEWS Interventions Escalated (See documentation below)  Pain Scale 0-10  Pain Score 0  Notify: Charge Nurse/RN  Name of Charge Nurse/RN Notified Erica RN  Date Charge Nurse/RN Notified 06/12/20  Time Charge Nurse/RN Notified 2016  Document  Patient Outcome Other (Comment) (no intervention necessary)  Progress note created (see row info) Yes  Red MEWS due to blood pressure and heart rate. Blood pressure is chronically low, no acute change. Heart rate inaccurately captured due to a-fib. Vital signs retaken, green MEWS. No intervention required. Will continue to monitor.

## 2020-06-12 NOTE — Progress Notes (Signed)
Subjective:   Hospital day: 2   Overnight event: No  MS. Spoto reports that she is doing well this morning. She only complained of epigastric pain with cough. She states that she always sleeps propped up in bed. Patient states that she had Covid about 1 year ago and since then her lungs are not the same.  Objective:  Vital signs in last 24 hours: Vitals:   06/12/20 0050 06/12/20 0059 06/12/20 0102 06/12/20 0307  BP: (!) 81/61 (!) 84/53 (!) 93/44 (!) 88/54  Pulse: 86 67 74 (!) 52  Resp: 16   17  Temp: 98.6 F (37 C)   97.9 F (36.6 C)  TempSrc: Oral   Oral  SpO2: 100%   98%  Weight:    102.4 kg  Height:        Physical Exam  Physical Exam Constitutional:      General: She is not in acute distress.    Appearance: Normal appearance. She is not toxic-appearing.  HENT:     Head: Normocephalic.  Eyes:     General: No scleral icterus.    Conjunctiva/sclera: Conjunctivae normal.  Cardiovascular:     Rate and Rhythm: Normal rate and regular rhythm.     Heart sounds: Normal heart sounds.  Pulmonary:     Effort: No respiratory distress.     Breath sounds: Wheezing present.     Comments: Crackles and diminished breath sound noted bilateral lung bases, worse on Right side Abdominal:     General: Bowel sounds are normal. There is distension.     Tenderness: There is abdominal tenderness (Tenderness RUQ with palpation). There is no guarding.  Musculoskeletal:     Cervical back: Normal range of motion.     Right lower leg: Edema (+2 edema) present.     Left lower leg: Edema (+2 edema) present.  Skin:    General: Skin is warm.     Coloration: Skin is not jaundiced.  Neurological:     Mental Status: She is alert.  Psychiatric:        Mood and Affect: Mood normal.        Behavior: Behavior normal.     Assessment/Plan: Melissa Adams is a 74 y.o. female with hx of ESRD on hemodialysis Monday, Wednesday, Friday, heart failure with reduced ejection fraction LVEF 30-35%  on May 2021, chronic atrial fibrillation on Eliquis, obstructive sleep apnea, chronic normocytic anemia, asthma who presented with cough and shortness of breath found to have a recurrent right-sided pleural effusion.  Principal Problem:   Recurrent right pleural effusion Active Problems:   Atrial fibrillation, chronic (HCC)   Essential hypertension   Acute on chronic systolic heart failure (HCC)   ESRD on dialysis [facility])   Aortic atherosclerosis (HCC)   Recurrent right-sided pleural effusion Differentials include Cirrhosis vs Parapneumonic infection vs HFrEF. IR guided thoracentesis which yielded about 1.4 L of fluid. Exudate per Light criteria. The recurrent Right pleural effusion is consistent with Cirrhosis. Elevated PT/PTT/INR and thrombocytopenia shows impaired synthetic liver function.  Negative hep B, hep C labs. No history of alcohol abuse. Evidence suggested cirrhosis and ascites on CT chest. Plan to do an Complete US abdomen.  - Continue ceftriaxone (Day 2/5) and azithromycin (day 3/5) for possible Parapneumonic infection. - Supplemental oxygen as needed - Pending abdominal ultrasound - Follow-up PT-INR daily - Monitor LFTs daily - Reassess pleural effusion tomorrow once patient has HD today.    Acute on chronic heart failure with reduced ejection fraction:  Echo in 5/20210 shows EF 30 - 35%. She has had chronically elevated BNP. She is unsure of her dry weight.  - HD scheduled MWF for fluid removal - Continue home Lasix 80 mg BID PO on non-dialysis days - Continue Toprol XL 25 mg BID  - Strict I's and O's - Daily weights - Monitor electrolytes closely   ESRD on HD MWF - Nephrology following and continue regular schedule MWF - Continue Midodrine before going to HD for hypotension   Persistent atrial fibrillation - Continue apixaban 5 mg daily - Continue Toprol XL 25 mg twice daily - Continue telemetry monitoring   Macrocytic anemia:  Likely secondary to hepatic  disease.  Hemoglobin 11.2, MCV 100<<103.  Vitamin B12 elevated at 5537, folic acid unremarkable -Continue to monitor   Gout -Continue allopurinol   Asthma -Continue montelukast, loratadine and inhalers   Diet: renal diet IVF: No VTE: Heparin CODE: Full  Prior to Admission Living Arrangement: home Anticipated Discharge Location: home Barriers to Discharge: pleural effusion Dispo: Anticipated discharge in approximately 2-3 day(s).   Gaylan Gerold, DO 06/12/2020, 8:10 AM Pager: [phone] After 5pm on weekdays and 1pm on weekends: On Call pager [phone]

## 2020-06-12 NOTE — Progress Notes (Signed)
Per [place] patient needs to be NPO. Informed patient and placed sign on the door.Pt needs to be NPO starting now until they call for [place], at least until 3 pm.

## 2020-06-12 NOTE — Progress Notes (Signed)
Pt back from HD, MEWS score was 2/3 , she is back now, with MEWS score of 1 green.  Pt will go to [place] at 5 pm, after that will be able to eat.

## 2020-06-13 LAB — RENAL FUNCTION PANEL
Albumin: 3 g/dL — ABNORMAL LOW (ref 3.5–5.0)
Anion gap: 11 (ref 5–15)
BUN: 27 mg/dL — ABNORMAL HIGH (ref 8–23)
CO2: 29 mmol/L (ref 22–32)
Calcium: 7.6 mg/dL — ABNORMAL LOW (ref 8.9–10.3)
Chloride: 98 mmol/L (ref 98–111)
Creatinine, Ser: 6.05 mg/dL — ABNORMAL HIGH (ref 0.44–1.00)
GFR calc Af Amer: 7 mL/min — ABNORMAL LOW (ref >60)
GFR calc non Af Amer: 6 mL/min — ABNORMAL LOW (ref >60)
Glucose, Bld: 88 mg/dL (ref 70–99)
Phosphorus: 4.7 mg/dL — ABNORMAL HIGH (ref 2.5–4.6)
Potassium: 4 mmol/L (ref 3.5–5.1)
Sodium: 138 mmol/L (ref 135–145)

## 2020-06-13 LAB — CBC
HCT: 34.6 % — ABNORMAL LOW (ref 36.0–46.0)
Hemoglobin: 10.4 g/dL — ABNORMAL LOW (ref 12.0–15.0)
MCH: 30.7 pg (ref 26.0–34.0)
MCHC: 30.1 g/dL (ref 30.0–36.0)
MCV: 102.1 fL — ABNORMAL HIGH (ref 80.0–100.0)
Platelets: 114 10*3/uL — ABNORMAL LOW (ref 150–400)
RBC: 3.39 MIL/uL — ABNORMAL LOW (ref 3.87–5.11)
RDW: 16.4 % — ABNORMAL HIGH (ref 11.5–15.5)
WBC: 4.9 10*3/uL (ref 4.0–10.5)
nRBC: 0 % (ref 0.0–0.2)

## 2020-06-13 MED ORDER — DOXYCYCLINE HYCLATE 100 MG PO TABS
100.0000 mg | ORAL_TABLET | Freq: Two times a day (BID) | ORAL | Status: DC
  Administered 2020-06-14: 100 mg via ORAL
  Filled 2020-06-13: qty 1

## 2020-06-13 MED ORDER — GUAIFENESIN 100 MG/5ML PO SOLN
5.0000 mL | ORAL | Status: DC | PRN
  Administered 2020-06-13: 100 mg via ORAL
  Filled 2020-06-13: qty 5

## 2020-06-13 NOTE — Progress Notes (Signed)
Subjective:   Hospital day: 3  Overnight event: No  Melissa Adams was seen at bedside this morning. She states that she was taken off of her oxygen last night and feels well. She states that she is feeling better than yesterday and is happy to be off her supplemental oxygen.   Objective:  Vital signs in last 24 hours: Vitals:   06/13/20 0407 06/13/20 0735 06/13/20 0740 06/13/20 0825  BP: (!) 85/45 (!) 80/40 (!) 89/51 (!) 103/48  Pulse: 85 87  75  Resp: 16 16 18 18   Temp: 98.2 F (36.8 C) 98.3 F (36.8 C)    TempSrc: Oral Oral    SpO2: 99% 98%  98%  Weight: 100 kg     Height:        Physical Exam Physical Exam Constitutional:      General: She is not in acute distress.    Appearance: She is not toxic-appearing.  HENT:     Head: Normocephalic.  Cardiovascular:     Rate and Rhythm: Normal rate.     Heart sounds: Normal heart sounds. No murmur heard.   Pulmonary:     Comments: Mild crackle and diminished airflow of Left lower lung.  Abdominal:     Palpations: Abdomen is soft.  Musculoskeletal:     Right lower leg: No tenderness. Edema (+2) present.     Left lower leg: No tenderness. Edema (+2) present.  Skin:    General: Skin is warm.  Neurological:     Mental Status: She is alert.  Psychiatric:        Mood and Affect: Mood normal.        Behavior: Behavior normal. Behavior is not agitated.     Assessment/Plan: Melissa Adams a 74 y.o.femalewith hx of ESRD on hemodialysis Monday, Wednesday, Friday, heart failure with reduced ejection fraction LVEF 30-35% on May 2021, chronic atrial fibrillation on Eliquis, obstructive sleep apnea, chronic normocytic anemia, asthma who presented with cough and shortness of breath found to have a recurrent right-sided pleural effusion.  Principal Problem:   Recurrent right pleural effusion Active Problems:   Atrial fibrillation, chronic (HCC)   Essential hypertension   Acute on chronic systolic heart failure (HCC)    ESRD on dialysis [facility])   Aortic atherosclerosis (HCC)   Recurrent right-sided pleural effusion Differentials include Cirrhosis vs Parapneumonic infection vsHFrEF. IR guided thoracentesis which yielded about 1.4 L of fluid. Exudate per Light criteria. The recurrent Right pleural effusion is consistent with Cirrhosis.  Her lung sound improves after HD yesterday. Wt drop from 228 - 220 lbs. - D/c Ceftriaxone and Azithromycin. Start Doxycyline 100 mg PO BID for 2 days - Continue Lasix 80 mg PO on non-HD day - Continue HD as schedule  - Patient O2 sat at 90% when ambulating. Patient is not on oxygen at home. Will assess if she needs to home with oxygen    Non-alcoholic liver disease Elevated PT/PTT/INR and thrombocytopenia shows impaired synthetic liver function. Negative hep B, hep C labs. No history of alcohol abuse. Evidence suggested cirrhosis and ascites on CT chest. US abdomen shows nodular hepatic contour suspect for cirrhosis with only trace amount of ascites. - Patient will need to follow with GI outpatient for further evaluation   Acute on chronic heart failure with reduced ejection fraction:  Echo in 5/20210 shows EF 30 - 35%. She has had chronically elevated BNP. She is unsure of her dry weight.  - HD scheduled MWF for fluid removal - Continue  home Lasix 80 mg BID PO on non-dialysis days - Continue Toprol XL 25 mg BID  - Strict I's and O's - Daily weights - Monitor electrolytes closely   ESRD on HD MWF Nephrology following and recommend HD 4 times a week given her fluid overload and hypotension.  - Continue Midodrine before going to HD for hypotension   Persistent atrial fibrillation - Continue apixaban 5 mg daily - Continue Toprol XL 25 mg twice daily - Continue telemetry monitoring   Macrocytic anemia: Likely secondary to hepatic disease. Hemoglobin 11.2, MCV 100<<103. Vitamin B12 elevated at 2767, folic acid unremarkable -Continue to  monitor   Gout -Continue allopurinol   Asthma -Continue montelukast, loratadine and inhalers   Diet: renal diet IVF: No VTE: Heparin CODE: Full  Prior to Admission Living Arrangement: home Anticipated Discharge Location: home Barriers to Discharge: oxygen supplement  Dispo: Anticipated discharge in approximately 1 day(s).    Gaylan Gerold, DO 06/13/2020, 8:36 AM Pager: [phone] After 5pm on weekdays and 1pm on weekends: On Call pager [phone]

## 2020-06-13 NOTE — Progress Notes (Signed)
Patient and family was concern about receiving oxygen as we plan for discharge in the next few days. Paged and spoke to Alfonse Spruce, MD from [facility] to relay the concern. Alfonse Spruce, MD was able to clear up the confusion in regards to the newly diagnosed sleep apnea the patient is having by supplying the patient with oxygen going home. Patient will need a new oxygen saturation ambulation tomorrow 06/14/20.

## 2020-06-13 NOTE — Progress Notes (Signed)
Pharmacist Heart Failure Core Measure Documentation  Assessment: Melissa Adams has an EF documented as 30-35% on 03/2020 by ECHO.  Rationale: Heart failure patients with left ventricular systolic dysfunction (LVSD) and an EF < 40% should be prescribed an angiotensin converting enzyme inhibitor (ACEI) or angiotensin receptor blocker (ARB) at discharge unless a contraindication is documented in the medical record.  This patient is not currently on an ACEI or ARB for HF.  This note is being placed in the record in order to provide documentation that a contraindication to the use of these agents is present for this encounter.  ACE Inhibitor or Angiotensin Receptor Blocker is contraindicated (specify all that apply)  []   ACEI allergy AND ARB allergy []   Angioedema []   Moderate or severe aortic stenosis []   Hyperkalemia [x]   Hypotension []   Renal artery stenosis [x]   Worsening renal function, preexisting renal disease or dysfunction  Melissa Adams, PharmD, [place]  PGY-1 Pharmacy Resident 06/13/2020 11:10 AM  Please check [email or web address] for unit-specific pharmacy phone numbers.

## 2020-06-13 NOTE — Evaluation (Addendum)
Physical Therapy Evaluation Patient Details Name: Melissa Adams MRN: [id number] DOB: 10/20/1946 Today's Date: 06/13/2020   History of Present Illness  74 yo admitted with recurrent pleural effusion with SOB. PMHx; ESRD MWF, heart failure with EF of 30%, AFib, OSA, HTN, gout, Covid 19  Clinical Impression  PT very pleasant and reports she lives with daughter, son-in-law and 7&8yo grandkids. Pt normally walks without AD but does use cane when walking to HD and has not been walking in community or grocery stores for a little while. Pt states she does have a rollator and encouraged to initiate walking program with rollator so she can have seated rest with leg pain and fatigue but to also increase gait distance. Pt able to maintain SpO2 >90% on RA throughout session and remained on RA end of session. Pt with decreased activity tolerance and gait who will benefit from acute therapy to maximize independence prior to return home.   HR 89-96 95% on RA at rest, 90% on RA with gait    Follow Up Recommendations No PT follow up    Equipment Recommendations  None recommended by PT    Recommendations for Other Services       Precautions / Restrictions Precautions Precautions: Fall Precaution Comments: watch sats      Mobility  Bed Mobility Overal bed mobility: Modified Independent             General bed mobility comments: HOB 20 degrees with pt able to pivot to the edge without assist  Transfers Overall transfer level: Needs assistance   Transfers: Sit to/from Stand Sit to Stand: Supervision         General transfer comment: supervision for safety  Ambulation/Gait Ambulation/Gait assistance: Supervision Gait Distance (Feet): 300 Feet Assistive device: None Gait Pattern/deviations: Step-through pattern;Decreased stride length Gait velocity: pt initially with quick gait and then declined with fatigue Gait velocity interpretation: 1.31 - 2.62 ft/sec, indicative of limited  community ambulator General Gait Details: pt with 3 standing rest breaks on return gait to room. pt with cues for breathing technique and safety. pt reports bil LE pain with fatigue  Stairs            Wheelchair Mobility    Modified Rankin (Stroke Patients Only)       Balance Overall balance assessment: Mild deficits observed, not formally tested                                           Pertinent Vitals/Pain Pain Assessment: No/denies pain    Home Living Family/patient expects to be discharged to:: Private residence Living Arrangements: Children Available Help at Discharge: Available 24 hours/day;Family Type of Home: Mobile home Home Access: Stairs to enter Entrance Stairs-Rails: None Entrance Stairs-Number of Steps: 5 Home Layout: One level Home Equipment: Toilet riser;Cane - single point;Walker - 4 wheels      Prior Function Level of Independence: Independent         Comments: pt uses a cane when she goes to HD     Hand Dominance        Extremity/Trunk Assessment   Upper Extremity Assessment Upper Extremity Assessment: Overall WFL for tasks assessed    Lower Extremity Assessment Lower Extremity Assessment: Overall WFL for tasks assessed    Cervical / Trunk Assessment Cervical / Trunk Assessment: Normal  Communication   Communication: No difficulties  Cognition Arousal/Alertness: Awake/alert Behavior  During Therapy: WFL for tasks assessed/performed Overall Cognitive Status: Within Functional Limits for tasks assessed                                        General Comments      Exercises     Assessment/Plan    PT Assessment Patient needs continued PT services  PT Problem List Decreased mobility;Decreased activity tolerance       PT Treatment Interventions Therapeutic exercise;Gait training;Functional mobility training;Stair training;Patient/family education;Therapeutic activities    PT Goals  (Current goals can be found in the Care Plan section)  Acute Rehab PT Goals Patient Stated Goal: return home and be able to breathe PT Goal Formulation: With patient Time For Goal Achievement: 06/27/20 Potential to Achieve Goals: Good    Frequency Min 3X/week   Barriers to discharge        Co-evaluation               AM-PAC PT "6 Clicks" Mobility  Outcome Measure Help needed turning from your back to your side while in a flat bed without using bedrails?: None Help needed moving from lying on your back to sitting on the side of a flat bed without using bedrails?: None Help needed moving to and from a bed to a chair (including a wheelchair)?: None Help needed standing up from a chair using your arms (e.g., wheelchair or bedside chair)?: None Help needed to walk in hospital room?: A Little Help needed climbing 3-5 steps with a railing? : A Little 6 Click Score: 22    End of Session Equipment Utilized During Treatment: Gait belt Activity Tolerance: Patient tolerated treatment well Patient left: in chair;with call bell/phone within reach Nurse Communication: Mobility status PT Visit Diagnosis: Other abnormalities of gait and mobility (R26.89)    Time: 1740-8144 PT Time Calculation (min) (ACUTE ONLY): 28 min   Charges:   PT Evaluation $PT Eval Moderate Complexity: 1 Mod          Ashby Leflore P, PT Acute Rehabilitation Services Pager: [phone] Office: [place] 06/13/2020, 12:57 PM

## 2020-06-13 NOTE — Plan of Care (Signed)

## 2020-06-13 NOTE — Progress Notes (Addendum)
Renal Navigator notes that 4 HD treatments per week has been made as a recommendation to patient, though per Nephrology note, patient is willing to think about it and has not stated affirmatively that she is willing to pursue if arranged at OP HD clinic.  Navigator has requested from [facility] clinic staff that they evaluate if they are able to accommodate a 4 tx per week schedule for patient and let Navigator know. Navigator will follow up.  Alphonzo Cruise, [place] Renal Navigator [phone]

## 2020-06-13 NOTE — Progress Notes (Signed)
[facility] Progress Note   Subjective:  Completed dialysis yesterday and tolerated 3.3L UF  Seen in room, breathing unchanged, not SOB at rest, but hasn't really tried to move yet.   Objective Vitals:   06/13/20 0407 06/13/20 0735 06/13/20 0740 06/13/20 0825  BP: (!) 85/45 (!) 80/40 (!) 89/51 (!) 103/48  Pulse: 85 87  75  Resp: 16 16 18 18   Temp: 98.2 F (36.8 C) 98.3 F (36.8 C)    TempSrc: Oral Oral    SpO2: 99% 98%  98%  Weight: 100 kg     Height:       Physical Exam General: Elderly female on nasal oxygen, nad  Heart:S1,S2 RRR Lungs: Bibasilar wheezing   Abdomen: S, NT Extremities: 2+ BLE pitting edema Dialysis Access: L AVF + bruit    Additional Objective Labs: Basic Metabolic Panel: Recent Labs  Lab 06/12/20 0512 06/12/20 1150 06/13/20 0628  NA 137 136 138  K 3.9 3.9 4.0  CL 98 96* 98  CO2 26 27 29   GLUCOSE 98 106* 88  BUN 41* 42* 27*  CREATININE 7.85* 7.70* 6.05*  CALCIUM 7.0* 7.0* 7.6*  PHOS  --  5.5* 4.7*   Liver Function Tests: Recent Labs  Lab 06/10/20 1650 06/10/20 1650 06/12/20 0512 06/12/20 1150 06/13/20 0628  AST 43*  --  32  --   --   ALT 43  --  36  --   --   ALKPHOS 275*  --  235*  --   --   BILITOT 2.1*  --  1.7*  --   --   PROT 6.7  --  6.1*  --   --   ALBUMIN 3.3*   < > 2.9* 2.7* 3.0*   < > = values in this interval not displayed.   Recent Labs  Lab 06/10/20 1650  LIPASE 44   CBC: Recent Labs  Lab 06/10/20 1610 06/10/20 1610 06/11/20 0245 06/12/20 0512 06/13/20 0628  WBC 7.4   < > 8.2 5.9 4.9  HGB 12.1   < > 11.2* 10.6* 10.4*  HCT 39.4   < > 36.2 34.7* 34.6*  MCV 103.4*  --  100.0 101.2* 102.1*  PLT 127*   < > 121* 114* 114*   < > = values in this interval not displayed.   Blood Culture    Component Value Date/Time   SDES PLEURAL FLUID 06/11/2020 0959   SDES PLEURAL FLUID 06/11/2020 0959   SPECREQUEST NONE 06/11/2020 0959   SPECREQUEST NONE 06/11/2020 0959   CULT  06/11/2020 0959    NO  GROWTH 2 DAYS Performed at [facility], [place] [address], [place], [place] [address]    REPTSTATUS 06/11/2020 FINAL 06/11/2020 0959   REPTSTATUS PENDING 06/11/2020 0959    Cardiac Enzymes: No results for input(s): CKTOTAL, CKMB, CKMBINDEX, TROPONINI in the last 168 hours. CBG: No results for input(s): GLUCAP in the last 168 hours. Iron Studies: No results for input(s): IRON, TIBC, TRANSFERRIN, FERRITIN in the last 72 hours. @lablastinr3 @ Studies/Results: DG Chest 1 View  Result Date: 06/11/2020 CLINICAL DATA:  Post thoracentesis EXAM: CHEST  1 VIEW COMPARISON:  06/10/2020 FINDINGS: Midline trachea. Moderate to marked enlargement of the cardiopericardial silhouette. Decrease in right-sided pleural effusion, tiny. No pneumothorax. No congestive failure. Improved right base airspace disease/atelectasis. There is likely mild left base subsegmental atelectasis. IMPRESSION: No pneumothorax after right-sided thoracentesis. Markedly decreased right pleural fluid with improved right base aeration. Cardiomegaly without congestive failure. Electronically Signed  By: Abigail Miyamoto M.D.   On: 06/11/2020 10:41   US Abdomen Complete  Result Date: 06/12/2020 CLINICAL DATA:  Distension EXAM: ABDOMEN ULTRASOUND COMPLETE COMPARISON:  Ultrasound 06/11/2020 FINDINGS: Gallbladder: Sludge and stones within the gallbladder. Slight increased wall thickness at 3.8 mm but negative sonographic Murphy. Common bile duct: Diameter: 3.6 mm Liver: No focal hepatic abnormality. Nodular hepatic contour. Portal vein is patent on color Doppler imaging with normal direction of blood flow towards the liver. IVC: No abnormality visualized. Pancreas: Visualized portion unremarkable. Spleen: Size and appearance within normal limits. Right Kidney: Length: 9.7 cm. Echogenicity within normal limits. No mass or hydronephrosis visualized. Left Kidney: Length: 10.7 cm. Echogenicity within normal limits. No mass or hydronephrosis  visualized. Abdominal aorta: Not well visualized at the mid and distal segments. Other findings: Incidental note made of right pleural effusion. Small amount of ascites. IMPRESSION: 1. Nodular hepatic contour suspect for cirrhosis. Only trace amount of ascites is noted. 2. Gallstones and sludge. Nonspecific slight increased gallbladder wall thickness without sonographic Murphy. 3. Right pleural effusion Electronically Signed   By: Donavan Foil M.D.   On: 06/12/2020 19:04   [place] ASCITES (ABDOMEN LIMITED)  Result Date: 06/11/2020 CLINICAL DATA:  Evaluation for ascites. EXAM: LIMITED ABDOMEN ULTRASOUND FOR ASCITES TECHNIQUE: Limited ultrasound survey for ascites was performed in all four abdominal quadrants. COMPARISON:  October 12, 2019, abdominal ultrasound FINDINGS: Limited evaluation of the 4 quadrants of the abdomen demonstrate small volume ascites in the lower quadrants. IMPRESSION: Small volume abdominal ascites. Electronically Signed   By: Fidela Salisbury M.D.   On: 06/11/2020 15:58   US THORACENTESIS ASP PLEURAL SPACE W/IMG GUIDE  Result Date: 06/11/2020 INDICATION: Right pleural effusion EXAM: ULTRASOUND GUIDED right THORACENTESIS MEDICATIONS: 10 cc 1% lidocaine. COMPLICATIONS: None immediate. PROCEDURE: An ultrasound guided thoracentesis was thoroughly discussed with the patient and questions answered. The benefits, risks, alternatives and complications were also discussed. The patient understands and wishes to proceed with the procedure. Written consent was obtained. Ultrasound was performed to localize and mark an adequate pocket of fluid in the right chest. The area was then prepped and draped in the normal sterile fashion. 1% Lidocaine was used for local anesthesia. Under ultrasound guidance a 19 G Yueh catheter was introduced. Thoracentesis was performed. The catheter was removed and a dressing applied. FINDINGS: A total of approximately 1.4 Liters of bloody fluid fluid was removed. Samples  were sent to the laboratory as requested by the clinical team. IMPRESSION: Successful ultrasound guided right thoracentesis yielding 1.4 L of pleural fluid. Read by Lavonia Drafts [facility] Electronically Signed   By: Corrie Mckusick D.O.   On: 06/11/2020 09:36   Medications: . cefTRIAXone (ROCEPHIN)  IV 1 g (06/12/20 1952)   . allopurinol  300 mg Oral Daily  . apixaban  5 mg Oral BID  . ascorbic acid  500 mg Oral Daily  . atorvastatin  40 mg Oral Daily  . azithromycin  250 mg Oral Daily  . Chlorhexidine Gluconate Cloth  6 each Topical Daily  . furosemide  80 mg Oral 2 times per day on Sun Tue Thu Sat  . loratadine  10 mg Oral Daily  . metoprolol succinate  25 mg Oral BID  . midodrine  10 mg Oral Once per day on Mon Wed Fri  . montelukast  10 mg Oral Daily    Dialysis Orders:  AF MWF 4h 180NRe 450/800 EDW 102.5kg 2K/2.25Ca UFP 2 L AVF Heparin 1000 Venofer 50 mg IV q week  Assessment/Plan: 1. Dyspnea/Right pleural effusion - s/p thoracentesis 06/12/2020 1.4L removed- exudate concerning for cirrhosis. [place] Ab suggesting cirrhosis.  Antibiotics started for possible PNA -per primary.  2. Chronic Systolic HF/NICM- EF 71-85%. Continue lowering volume as tolerated. PO Lasix on non HD days  3. ESRD -  HD MWF. Next HD 7/21.  4. Hypertension/volume  - Chronic hypotension. On midodrine for BP support. Volume excess on exam. Continue lowering volume as tolerated. Net UF 3.3L post HD weight 100.2kg on  7/19 5. Anemia  - Hgb at goal. No ESA needs.  6. Metabolic bone disease -  Corr Ca low. Discontinue Sensipar for now. Use added Ca bath on HD.  7. Chronic Afib - on Eliquis  Tamea Bai Larina Earthly PA-C [facility] 06/13/2020,8:33 AM

## 2020-06-14 ENCOUNTER — Encounter: Payer: Self-pay | Admitting: Nurse Practitioner

## 2020-06-14 LAB — RENAL FUNCTION PANEL
Albumin: 3.2 g/dL — ABNORMAL LOW (ref 3.5–5.0)
Anion gap: 16 — ABNORMAL HIGH (ref 5–15)
BUN: 41 mg/dL — ABNORMAL HIGH (ref 8–23)
CO2: 23 mmol/L (ref 22–32)
Calcium: 7.9 mg/dL — ABNORMAL LOW (ref 8.9–10.3)
Chloride: 98 mmol/L (ref 98–111)
Creatinine, Ser: 7.36 mg/dL — ABNORMAL HIGH (ref 0.44–1.00)
GFR calc Af Amer: 6 mL/min — ABNORMAL LOW (ref >60)
GFR calc non Af Amer: 5 mL/min — ABNORMAL LOW (ref >60)
Glucose, Bld: 83 mg/dL (ref 70–99)
Phosphorus: 5.2 mg/dL — ABNORMAL HIGH (ref 2.5–4.6)
Potassium: 3.9 mmol/L (ref 3.5–5.1)
Sodium: 137 mmol/L (ref 135–145)

## 2020-06-14 LAB — CBC
HCT: 39.4 % (ref 36.0–46.0)
Hemoglobin: 12.3 g/dL (ref 12.0–15.0)
MCH: 31.6 pg (ref 26.0–34.0)
MCHC: 31.2 g/dL (ref 30.0–36.0)
MCV: 101.3 fL — ABNORMAL HIGH (ref 80.0–100.0)
Platelets: 105 10*3/uL — ABNORMAL LOW (ref 150–400)
RBC: 3.89 MIL/uL (ref 3.87–5.11)
RDW: 16.2 % — ABNORMAL HIGH (ref 11.5–15.5)
WBC: 5.2 10*3/uL (ref 4.0–10.5)
nRBC: 0 % (ref 0.0–0.2)

## 2020-06-14 MED ORDER — HEPARIN SODIUM (PORCINE) 1000 UNIT/ML IJ SOLN
2000.0000 [IU] | Freq: Once | INTRAMUSCULAR | Status: AC
  Filled 2020-06-14: qty 2

## 2020-06-14 MED ORDER — ALBUMIN HUMAN 25 % IV SOLN
INTRAVENOUS | Status: AC
  Administered 2020-06-14: 25 g via INTRAVENOUS
  Filled 2020-06-14: qty 100

## 2020-06-14 MED ORDER — MIDODRINE HCL 5 MG PO TABS: 10.0000 mg | ORAL_TABLET | Freq: Once | ORAL | Status: AC

## 2020-06-14 MED ORDER — ALBUMIN HUMAN 25 % IV SOLN
25.0000 g | Freq: Once | INTRAVENOUS | Status: AC
  Filled 2020-06-14: qty 100

## 2020-06-14 MED ORDER — MIDODRINE HCL 5 MG PO TABS
ORAL_TABLET | ORAL | Status: AC
  Administered 2020-06-14: 10 mg via ORAL
  Filled 2020-06-14: qty 2

## 2020-06-14 MED ORDER — DOXYCYCLINE HYCLATE 100 MG PO TABS: 100.0000 mg | ORAL_TABLET | Freq: Two times a day (BID) | ORAL | 0 refills | Status: AC

## 2020-06-14 MED ORDER — ATORVASTATIN CALCIUM 40 MG PO TABS: 40.0000 mg | ORAL_TABLET | Freq: Every day | ORAL | 1 refills | Status: DC

## 2020-06-14 MED ORDER — ALLOPURINOL 300 MG PO TABS: 300.0000 mg | ORAL_TABLET | Freq: Every day | ORAL | 1 refills | Status: DC

## 2020-06-14 MED ORDER — HEPARIN SODIUM (PORCINE) 1000 UNIT/ML IJ SOLN
INTRAMUSCULAR | Status: AC
  Administered 2020-06-14: 2000 [IU] via INTRAVENOUS
  Filled 2020-06-14: qty 2

## 2020-06-14 NOTE — Progress Notes (Signed)
Discharge and medication education given with teach back.  Nurse called daughter Chong Sicilian, who will be picking pt up.  Peripheral iv removed and dressing applied.  Pt's belongings with pt: clothes, shoes,cell phone and jewelry.  Oxygen  already delivered to pt's room.

## 2020-06-14 NOTE — Progress Notes (Signed)
Renal Navigator spoke with Agricultural consultant at [facility] regarding possibility of 4 HD tx per week. She will speak with her Clinic Manager, as she is not sure they have availability for this at this time. Clinic staff will follow up with patient regarding this recommendation.  Renal Navigator received message from Primary that patient's daughter had questions about 4 tx per week and asked Navigator to contact daughter. Navigator spoke with daughter/[name] to explain that though this has been recommended, the clinic is seeing if they can accommodate, which may not be possible at this time-at least not on a set schedule. [name] asked if time could be added to her mother's treatments 3 x per week instead of adding another treatment each week. Navigator asked that [name] call the clinic directly to discuss treatment plan for her mother. [name] agreed.  Alphonzo Cruise, [place] Renal Navigator [phone]

## 2020-06-14 NOTE — Progress Notes (Signed)
Pt. bp 70/50 uf off. Dr. Royce Macadamia aware orders for albumin and midodrine as documented. Pt. Stable tx continued.

## 2020-06-14 NOTE — Discharge Instructions (Signed)

## 2020-06-14 NOTE — TOC Initial Note (Signed)
Transition of Care [facility]) - Initial/Assessment Note    Patient Details  Name: Melissa Adams MRN: [id number] Date of Birth: [birth year]  [facility] CM/SW Contact:    Zenon Mayo, RN Phone Number: 06/14/2020, 10:15 AM  Clinical Narrative:                 NCM spoke with patient and her daughter was on the phone. They state they would like [employer] to do home oxygen for them.  NCM made referral to [facility] with [employer] for home oxygen.  Patient states she has to get HD today which will take 4 hrs, daughter states she would like for somone to show her how to work the oxygen when she comes to transport her home, her name is Chong Sicilian  And her phone is [phone], she is the POA.  Expected Discharge Plan: Home/Self Care Barriers to Discharge: No Barriers Identified   Patient Goals and CMS Choice Patient states their goals for this hospitalization and ongoing recovery are:: to get better   Choice offered to / list presented to : NA  Expected Discharge Plan and Services Expected Discharge Plan: Home/Self Care   Discharge Planning Services: CM Consult Post Acute Care Choice: Durable Medical Equipment Living arrangements for the past 2 months: Apartment                 DME Arranged: Oxygen DME Agency: [employer] Date DME Agency Contacted: 06/14/20 Time DME Agency Contacted: 0814 Representative spoke with at DME Agency: [place]: NA          Prior Living Arrangements/Services Living arrangements for the past 2 months: Apartment Lives with:: Adult Children, Minor Children Patient language and need for interpreter reviewed:: Yes Do you feel safe going back to the place where you live?: Yes      Need for Family Participation in Patient Care: Yes (Comment) Care giver support system in place?: Yes (comment)   Criminal Activity/Legal Involvement Pertinent to Current Situation/Hospitalization: No - Comment as needed  Activities of Daily Living   ADL Screening  (condition at time of admission) Patient's cognitive ability adequate to safely complete daily activities?: Yes  Permission Sought/Granted                  Emotional Assessment Appearance:: Appears stated age Attitude/Demeanor/Rapport: Engaged Affect (typically observed): Appropriate Orientation: : Oriented to Self, Oriented to Place, Oriented to  Time, Oriented to Situation Alcohol / Substance Use: Not Applicable Psych Involvement: No (comment)  Admission diagnosis:  Dyspnea [R06.00] Pleural effusion [J90] Congestive heart failure, unspecified HF chronicity, unspecified heart failure type ([place]) [I50.9] Patient Active Problem List   Diagnosis Date Noted  . Pleural effusion 06/11/2020  . Aortic atherosclerosis ([place]) 06/11/2020  . ESRD on dialysis ([place]) 01/11/2020  . Acute on chronic systolic heart failure ([place])   . Pressure injury of skin 10/22/2019  . Dyspnea   . COVID-19 virus infection   . CHF exacerbation ([place]) 10/12/2019  . Acute CHF (congestive heart failure) ([place]) 10/11/2019  . Atrial fibrillation, chronic ([place]) 10/11/2019  . Essential hypertension 10/11/2019  . Sleep apnea 10/11/2019  . AKI (acute kidney injury) ([place]) 10/11/2019  . Gout 10/11/2019  . Normocytic anemia 10/11/2019   PCP:  [name], NP Pharmacy:   [facility] [address] [place] [address] Phone: [phone] Fax: [place], [place] [address]  [place] [address] Phone: [phone] Fax: [phone]  [facility] [place], [place] - [address] [place], [place] [address] Phone: [phone] Fax: [phone]     Social Determinants of Health (SDOH) Interventions Social Connections Interventions: Intervention Not Indicated Transportation Interventions: Intervention Not  Indicated  Readmission Risk Interventions Readmission Risk Prevention Plan 06/14/2020  Transportation Screening Complete  Medication Review ([place]) Complete  PCP or Specialist appointment within 3-5 days of discharge Complete  HRI or [place] Complete  SW Recovery Care/Counseling Consult Complete  [place] Not Applicable  Some recent data might be hidden

## 2020-06-14 NOTE — Procedures (Signed)
Seen and examined on dialysis at 4:30pm.  Blood pressure 96/61 and HR 53.  Tolerating goal. Left AVF.  Had extra midodrine as she got midodrine this AM a while before HD.  Got albumin 25 gram IV earlier in tx.    Claudia Desanctis, MD 06/14/2020  4:36 PM

## 2020-06-14 NOTE — Discharge Summary (Signed)
Name: Melissa Adams MRN: [id number] DOB: 05-31-1946 74 y.o. PCP: [name], NP  Date of Admission: 06/10/2020  4:01 PM Date of Discharge:  Attending Physician: [name], MD  Discharge Diagnosis: 1. Right pleural effusion  2. ESRD on HD 3. Liver cirrhosis  4. Chronic a-fib  Discharge Medications: Allergies as of 06/14/2020      Reactions   Black Walnut Flavor Anaphylaxis   Shellfish Allergy Itching   Makes throat itch       Medication List    TAKE these medications   acetaminophen 325 MG tablet Commonly known as: TYLENOL Take 2 tablets (650 mg total) by mouth every 4 (four) hours as needed for headache or mild pain.   albuterol 108 (90 Base) MCG/ACT inhaler Commonly known as: VENTOLIN HFA Inhale 2 puffs into the lungs every 6 (six) hours as needed for wheezing or shortness of breath.   allopurinol 300 MG tablet Commonly known as: ZYLOPRIM Take 1 tablet (300 mg total) by mouth at bedtime.   apixaban 5 MG Tabs tablet Commonly known as: Eliquis Take 1 tablet (5 mg total) by mouth 2 (two) times daily.   ascorbic acid 500 MG tablet Commonly known as: VITAMIN C Take 1 tablet (500 mg total) by mouth daily.   atorvastatin 40 MG tablet Commonly known as: LIPITOR Take 1 tablet (40 mg total) by mouth at bedtime.   Auryxia 1 GM 210 MG(Fe) tablet Generic drug: ferric citrate Take 210 mg by mouth 3 (three) times daily with meals.   cetirizine 10 MG tablet Commonly known as: ZYRTEC Take 10 mg by mouth daily as needed for allergies.   cinacalcet 30 MG tablet Commonly known as: SENSIPAR Take 30 mg by mouth daily with supper.   Dialyvite 800 0.8 MG Tabs Take 1 tablet by mouth daily.   doxycycline 100 MG tablet Commonly known as: VIBRA-TABS Take 1 tablet (100 mg total) by mouth every 12 (twelve) hours for 2 days.   furosemide 80 MG tablet Commonly known as: LASIX Take 80 mg by mouth See admin instructions. Take 1 tablet (80 mg) by mouth twice  daily on non-dialysis days - Sunday, Tuesday, Thursday, Saturday   lidocaine-prilocaine cream Commonly known as: EMLA Apply 1 application topically See admin instructions. Apply topically to port access one hour prior to dialysis - Monday, Wednesday, Friday   metoprolol succinate 25 MG 24 hr tablet Commonly known as: TOPROL-XL Take 1.5 tablets (37.5 mg total) by mouth 2 (two) times daily. What changed: when to take this   midodrine 10 MG tablet Commonly known as: PROAMATINE Take 10 mg by mouth See admin instructions. Take one tablet (10 mg) by mouth on Monday, Wednesday, Friday prior to dialysis   montelukast 10 MG tablet Commonly known as: SINGULAIR Take 1 tablet (10 mg total) by mouth daily. What changed: when to take this            Durable Medical Equipment  (From admission, onward)         Start     Ordered   06/14/20 0814  For home use only DME oxygen  Once       Question Answer Comment  Length of Need 6 Months   Mode or (Route) Nasal cannula   Liters per Minute 2   Frequency Continuous (stationary and portable oxygen unit needed)   Oxygen conserving device Yes   Oxygen delivery system Gas      07 /21/21 0177  Disposition and follow-up:   [name] was discharged from [facility] in Stable condition.  At the hospital follow up visit please address:  1.  Right pleural effusion This is a recurrent which could be secondary to Cirrhosis vs Parapneumonic infection vsHFrEF. Please reassess with physical exam or CXR on follow up.   ESRD on HD HD on MWF. Midodrine before HD  Liver cirrhosis  US abdomen shows nodular hepatic contour suspect for cirrhosis with only trace amount of ascites. Negative hep B, hep C labs.No history of alcohol abuse per patient. Please follow up for further evaluation.    2.  Labs / imaging needed at time of follow-up: no  3.  Pending labs/ test needing follow-up: no  Follow-up Appointments:   Follow-up Information    [name], NP On 06/21/2020.   Specialty: Internal Medicine Why: @ 9:30am Contact information: [id number] [address] [place] [facility].   Why: home oxygen Contact information: [place] [address] [phone]        [facility]. Schedule an appointment as soon as possible for a visit on 07/19/2020.   Specialty: Gastroenterology Why: @ 8:30am Contact information: [name] [id number] [place] [facility] Course by problem list: 1.  Right pleural effusion  Patient present to the hospital with complain of productive cough, orthopnea, exertional SOB and bilateral pitting edema of the LE's. CT chest shows moderate right pleural effusion with compressive atelectasis. IR performed thoracentesis and withdrew 1.4 of fluid, which is exudate per Light criteria. Ceftriaxone and Azithromycin were started for possible parapneumonic infection and changed to Doxycycline to finish a course of 5 days total. Differentials include Cirrhosis vs Parapneumonic infection vsHFrEF.   Liver cirrhosis CT Chest without contrast shows slight irregular appearance of the liver contour concerning for changes of cirrhosis. Ultrasound abdomen shows nodular hepatic contour suspect for cirrhosis with only trace amount of ascites. This could be the secondary cause of patient's recurrent Right pleural effusion. Negative hep B, hep C labs.No history of alcohol abuse per patient. Patient will follow up with GI for further evaluation.  ESRD Patient was dialyzed according to her regular schedule of MWF.   Discharge Vitals:   BP (!) 94/47   Pulse (!) 53   Temp 97.7 F (36.5 C) (Oral)   Resp 19   Ht 5\' 3"  (1.6 m)   Wt 101.8 kg   SpO2 95%   BMI 39.77 kg/m   Pertinent Labs, Studies, and Procedures:  CBC Latest Ref Rng & Units 06/14/2020  06/13/2020 06/12/2020  WBC 4.0 - 10.5 K/uL 5.2 4.9 5.9  Hemoglobin 12.0 - 15.0 g/dL 12.3 10.4(L) 10.6(L)  Hematocrit 36 - 46 % 39.4 34.6(L) 34.7(L)  Platelets 150 - 400 K/uL 105(L) 114(L) 114(L)   BMP Latest Ref Rng & Units 06/14/2020 06/13/2020 06/12/2020  Glucose 70 - 99 mg/dL 83 88 106(H)  BUN 8 - 23 mg/dL 41(H) 27(H) 42(H)  Creatinine 0.44 - 1.00 mg/dL 7.36(H) 6.05(H) 7.70(H)  Sodium 135 - 145 mmol/L 137 138 136  Potassium 3.5 - 5.1 mmol/L 3.9 4.0 3.9  Chloride 98 - 111 mmol/L 98 98 96(L)  CO2 22 - 32 mmol/L 23 29 27   Calcium 8.9 - 10.3 mg/dL 7.9(L) 7.6(L) 7.0(L)   DG Chest 1 View  Result Date: 06/11/2020 CLINICAL DATA:  Post thoracentesis EXAM: CHEST  1 VIEW COMPARISON:  06/10/2020 FINDINGS: Midline trachea. Moderate to marked enlargement of the cardiopericardial silhouette. Decrease in right-sided pleural effusion, tiny. No pneumothorax. No congestive failure. Improved right base airspace disease/atelectasis. There is likely mild left base subsegmental atelectasis. IMPRESSION: No pneumothorax after right-sided thoracentesis. Markedly decreased right pleural fluid with improved right base aeration. Cardiomegaly without congestive failure. Electronically Signed   By: Abigail Miyamoto M.D.   On: 06/11/2020 10:41   CT Chest Wo Contrast  Result Date: 06/10/2020 CLINICAL DATA:  74 year old female with shortness of breath. EXAM: CT CHEST WITHOUT CONTRAST TECHNIQUE: Multidetector CT imaging of the chest was performed following the standard protocol without IV contrast. COMPARISON:  Chest radiograph dated 06/10/2020. FINDINGS: Evaluation of this exam is limited in the absence of intravenous contrast. Cardiovascular: Moderate cardiomegaly. No pericardial effusion. There is atherosclerotic calcification of the aorta. Mild dilatation of the main pulmonary trunk suggestive of a degree of pulmonary hypertension. Clinical correlation is recommended. Mediastinum/Nodes: There is no hilar or mediastinal adenopathy.  The esophagus and the thyroid gland are grossly unremarkable. No mediastinal fluid collection. Lungs/Pleura: There is a moderate right pleural effusion with compressive atelectasis of the majority of the right lower and middle lobes. Pneumonia is not excluded clinical correlation and follow-up recommended. There is subsegmental atelectasis in the lingula. Mild bronchiectasis. There is no pneumothorax. The central airways are patent. Upper Abdomen: Slight irregular appearance of the liver contour concerning for changes of cirrhosis. Multiple stones noted within the gallbladder. There is partially visualized upper abdominal ascites. Indeterminate 15 mm nodule in the left adrenal gland. Musculoskeletal: There is diffuse subcutaneous edema and anasarca. Osteopenia with degenerative changes of the spine. No acute osseous pathology. IMPRESSION: 1. Moderate right pleural effusion with compressive atelectasis of the majority of the right lower and middle lobes. Pneumonia is not excluded clinical correlation and follow-up recommended. 2. Moderate cardiomegaly. 3. Cholelithiasis. 4. Cirrhosis with partially visualized upper abdominal ascites. 5. Aortic Atherosclerosis (ICD10-I70.0). Electronically Signed   By: Anner Crete M.D.   On: 06/10/2020 19:30   US Abdomen Complete  Result Date: 06/12/2020 CLINICAL DATA:  Distension EXAM: ABDOMEN ULTRASOUND COMPLETE COMPARISON:  Ultrasound 06/11/2020 FINDINGS: Gallbladder: Sludge and stones within the gallbladder. Slight increased wall thickness at 3.8 mm but negative sonographic Murphy. Common bile duct: Diameter: 3.6 mm Liver: No focal hepatic abnormality. Nodular hepatic contour. Portal vein is patent on color Doppler imaging with normal direction of blood flow towards the liver. IVC: No abnormality visualized. Pancreas: Visualized portion unremarkable. Spleen: Size and appearance within normal limits. Right Kidney: Length: 9.7 cm. Echogenicity within normal limits. No mass  or hydronephrosis visualized. Left Kidney: Length: 10.7 cm. Echogenicity within normal limits. No mass or hydronephrosis visualized. Abdominal aorta: Not well visualized at the mid and distal segments. Other findings: Incidental note made of right pleural effusion. Small amount of ascites. IMPRESSION: 1. Nodular hepatic contour suspect for cirrhosis. Only trace amount of ascites is noted. 2. Gallstones and sludge. Nonspecific slight increased gallbladder wall thickness without sonographic Murphy. 3. Right pleural effusion Electronically Signed   By: Donavan Foil M.D.   On: 06/12/2020 19:04   DG Chest Portable 1 View  Result Date: 06/10/2020 CLINICAL DATA:  Shortness of breath EXAM: PORTABLE CHEST 1 VIEW COMPARISON:  May 06, 2020 FINDINGS: Again noted is marked cardiomegaly. There is blunting of the right costophrenic angle likely there remains a right effusion with hazy airspace opacity at the right lung base which could be layering effusion or  atelectasis. The left lung is clear. Aortic calcifications are seen. No acute osseous abnormality. IMPRESSION: Stable cardiomegaly. Right pleural effusion and hazy layering effusions/atelectasis. Electronically Signed   By: Prudencio Pair M.D.   On: 06/10/2020 16:58   [place] ASCITES (ABDOMEN LIMITED)  Result Date: 06/11/2020 CLINICAL DATA:  Evaluation for ascites. EXAM: LIMITED ABDOMEN ULTRASOUND FOR ASCITES TECHNIQUE: Limited ultrasound survey for ascites was performed in all four abdominal quadrants. COMPARISON:  October 12, 2019, abdominal ultrasound FINDINGS: Limited evaluation of the 4 quadrants of the abdomen demonstrate small volume ascites in the lower quadrants. IMPRESSION: Small volume abdominal ascites. Electronically Signed   By: Fidela Salisbury M.D.   On: 06/11/2020 15:58   US THORACENTESIS ASP PLEURAL SPACE W/IMG GUIDE  Result Date: 06/11/2020 INDICATION: Right pleural effusion EXAM: ULTRASOUND GUIDED right THORACENTESIS MEDICATIONS: 10 cc 1%  lidocaine. COMPLICATIONS: None immediate. PROCEDURE: An ultrasound guided thoracentesis was thoroughly discussed with the patient and questions answered. The benefits, risks, alternatives and complications were also discussed. The patient understands and wishes to proceed with the procedure. Written consent was obtained. Ultrasound was performed to localize and mark an adequate pocket of fluid in the right chest. The area was then prepped and draped in the normal sterile fashion. 1% Lidocaine was used for local anesthesia. Under ultrasound guidance a 19 G Yueh catheter was introduced. Thoracentesis was performed. The catheter was removed and a dressing applied. FINDINGS: A total of approximately 1.4 Liters of bloody fluid fluid was removed. Samples were sent to the laboratory as requested by the clinical team. IMPRESSION: Successful ultrasound guided right thoracentesis yielding 1.4 L of pleural fluid. Read by Lavonia Drafts [facility] Electronically Signed   By: Corrie Mckusick D.O.   On: 06/11/2020 09:36    Discharge Instructions: Discharge Instructions    Diet - low sodium heart healthy   Complete by: As directed    Discharge instructions   Complete by: As directed    MS. Palardy,   It was a pleasure taking care of you during your hospital stay.  You were admitted because of excess fluid and also fluid around your right lung.  You had a procedure to drain the fluid and he tolerated the procedure well.  We also did hemodialysis while you are in the hospital.  We discovered that you have a liver disease called cirrhosis and would like for you to follow-up with a liver doctor.  Take care!   Increase activity slowly   Complete by: As directed       Signed: Gaylan Gerold, DO 06/14/2020, 2:05 PM   Pager: [phone]

## 2020-06-14 NOTE — TOC Transition Note (Signed)
Transition of Care [facility]) - CM/SW Discharge Note   Patient Details  Name: Melissa Adams MRN: [id number] Date of Birth: 1946/10/27  Transition of Care [facility]) CM/SW Contact:  Zenon Mayo, RN Phone Number: 06/14/2020, 10:18 AM   Clinical Narrative:    Patient for possible dc today, she will be getting HD today and she will be getting home oxygen thru [employer] and daughter states she will need someone to show her how to work the oxygen.   Final next level of care: Home/Self Care Barriers to Discharge: No Barriers Identified   Patient Goals and CMS Choice Patient states their goals for this hospitalization and ongoing recovery are:: to get better   Choice offered to / list presented to : NA  Discharge Placement                       Discharge Plan and Services   Discharge Planning Services: CM Consult Post Acute Care Choice: Durable Medical Equipment          DME Arranged: Oxygen DME Agency: [employer] Date DME Agency Contacted: 06/14/20 Time DME Agency Contacted: [phone] Representative spoke with at DME Agency: Thedore Mins HH Arranged: NA          Social Determinants of Health (SDOH) Interventions Social Connections Interventions: Intervention Not Indicated Transportation Interventions: Intervention Not Indicated   Readmission Risk Interventions Readmission Risk Prevention Plan 06/14/2020  Transportation Screening Complete  Medication Review [employer] photographer) Complete  PCP or Specialist appointment within 3-5 days of discharge Complete  HRI or [place] Complete  SW Recovery Care/Counseling Consult Complete  [place] Not Applicable  Some recent data might be hidden

## 2020-06-14 NOTE — Care Management Important Message (Signed)
Important Message  Patient Details  Name: Melissa Adams MRN: [id number] Date of Birth: 1946-09-07   Medicare Important Message Given:  Yes     Shelda Altes 06/14/2020, 11:08 AM

## 2020-06-14 NOTE — Progress Notes (Signed)
Subjective:  Hospital day: 4  Overnight event: No  Ms. Melissa Adams was seen at bedside this AM. She states that she is doing well, apart from a runny nose. She states that she is ready for discharge, and is eager to go home. We discussed the need to get her CPAP for her OSA. She states that she should be able to obtain it next month. She denies abdominal pain, normal BMs, and urination. All questions and concerns were addressed.   Objective:  Vital signs in last 24 hours: Vitals:   06/14/20 0035 06/14/20 0104 06/14/20 0328 06/14/20 0736  BP: (!) 99/56  (!) 100/53 (!) 97/52  Pulse: (!) 51  (!) 55 (!) 52  Resp: 18  16 19   Temp: 97.7 F (36.5 C)  97.9 F (36.6 C) 97.7 F (36.5 C)  TempSrc: Oral  Oral Oral  SpO2: 94%  97% 95%  Weight:  101.8 kg    Height:       Physical Exam Constitutional:      General: She is not in acute distress.    Appearance: She is not toxic-appearing.  HENT:     Head: Normocephalic.  Cardiovascular:     Rate and Rhythm: Normal rate and regular rhythm.     Heart sounds: Normal heart sounds.  Pulmonary:     Effort: Pulmonary effort is normal. No tachypnea.     Comments: Mild crackles and diminished breath sound noted at lung bases.  Abdominal:     General: Bowel sounds are normal.     Palpations: Abdomen is soft.     Tenderness: There is no abdominal tenderness.  Musculoskeletal:        General: Normal range of motion.     Right lower leg: No tenderness. Edema (+2) present.     Left lower leg: No tenderness. Edema (+2) present.  Skin:    General: Skin is warm.  Neurological:     Mental Status: She is alert.  Psychiatric:        Mood and Affect: Mood normal.        Behavior: Behavior normal.    Lab  CBC Latest Ref Rng & Units 06/14/2020 06/13/2020 06/12/2020  WBC 4.0 - 10.5 K/uL 5.2 4.9 5.9  Hemoglobin 12.0 - 15.0 g/dL 12.3 10.4(L) 10.6(L)  Hematocrit 36 - 46 % 39.4 34.6(L) 34.7(L)  Platelets 150 - 400 K/uL 105(L) 114(L) 114(L)   BMP Latest  Ref Rng & Units 06/14/2020 06/13/2020 06/12/2020  Glucose 70 - 99 mg/dL 83 88 106(H)  BUN 8 - 23 mg/dL 41(H) 27(H) 42(H)  Creatinine 0.44 - 1.00 mg/dL 7.36(H) 6.05(H) 7.70(H)  Sodium 135 - 145 mmol/L 137 138 136  Potassium 3.5 - 5.1 mmol/L 3.9 4.0 3.9  Chloride 98 - 111 mmol/L 98 98 96(L)  CO2 22 - 32 mmol/L 23 29 27   Calcium 8.9 - 10.3 mg/dL 7.9(L) 7.6(L) 7.0(L)    Assessment/Plan: Melissa Adams a 74 y.o.femalewith hx of ESRD on hemodialysis Monday, Wednesday, Friday, heart failure with reduced ejection fraction LVEF 30-35% on May 2021, chronic atrial fibrillation on Eliquis, obstructive sleep apnea, chronic normocytic anemia, asthma who presented with cough and shortness of breath found to have a recurrent right-sided pleural effusion. PT is ok with patient going home. Assess if patient will need oxygen to go home.   Principal Problem:   Pleural effusion Active Problems:   Atrial fibrillation, chronic (HCC)   Essential hypertension   Acute on chronic systolic heart failure (HCC)   ESRD  on dialysis [facility])   Aortic atherosclerosis (HCC)   Recurrent right-sided pleural effusion Differentials include Cirrhosis vsParapneumonic infection vsHFrEF.IR guided thoracentesis which yielded about 1.4 L of fluid. Exudateper Light criteria. The recurrent Right pleural effusion is consistent with Cirrhosis.  Crackles still noted at lung bases. Her wt goes up 220 - 224. She does not have good urine output.  -Continue Doxycyline 100 mg PO BID for 2 days (2/2) - Continue Lasix 80 mg PO on non-HD day - Continue HD as schedule  - Patient O2 sat at 90% when ambulating, and stays > 92% on 2L when ambulating. Patient is not on oxygen at home. Patient may be qualified to have oxygen at home. Plan is to d/c after HD today.   ESRD on HD MWF Patient does not have good urine output and she put on 4 lbs sicne yesterday (220-224). She still have +2 pitting edema in LE bialterally. Given herher fluid  overload and hypotension, nephrology recommend HD 4 times a week. - Continue Midodrine before going to HD for hypotension   Non-alcoholic liver disease Elevated PT/PTT/INR and thrombocytopenia shows impaired synthetic liver function. Negative hep B, hep C labs.No history of alcohol abuse. Evidence suggested cirrhosis and ascites on CT chest. US abdomen shows nodular hepatic contour suspect for cirrhosis with only trace amount of ascites. - Patient will need to follow with GI outpatient for further evaluation   Acute on chronic heart failure with reduced ejection fraction: Echo in 5/20210 shows EF 30 - 35%.She has had chronically elevated BNP. She is unsure of her dry weight. - HD scheduled MWF for fluid removal -Continue home Lasix80 mg BID PO on non-dialysis days - Continue Toprol XL 25 mg BID  -Strict I's and O's -Daily weights -Monitor electrolytes closely   Persistent atrial fibrillation -Continue apixaban 5 mg daily - ContinueToprol XL 25 mg twice daily -Continue telemetry monitoring   Macrocytic anemia: Likely secondary to hepatic disease. Hemoglobin 11.2, MCV 100<<103. Vitamin B12 elevated at 5784, folic acid unremarkable -Continue to monitor   Gout -Continue allopurinol   Asthma -Continue montelukast, loratadine and inhalers   Diet:renal diet IVF:No ONG:EXBMWUX CODE:Full  Prior to Admission Living Arrangement:home Anticipated Discharge Location:home Barriers to Discharge: HD Dispo: Anticipated discharge in approximately1day(s).    Gaylan Gerold, DO 06/13/2020, 8:36 AM Pager: [phone] After 5pm on weekdays and 1pm on weekends: On Call pager [phone]

## 2020-06-14 NOTE — Progress Notes (Signed)
[facility] Progress Note   Subjective:  Seen in room, up walking with staff. DOE. Remains on supp O2.  For dialysis today   Objective Vitals:   06/14/20 0104 06/14/20 0328 06/14/20 0736 06/14/20 0940  BP:  (!) 100/53 (!) 97/52 (!) 94/47  Pulse:  (!) 55 (!) 52 (!) 53  Resp:  16 19   Temp:  97.9 F (36.6 C) 97.7 F (36.5 C)   TempSrc:  Oral Oral   SpO2:  97% 95%   Weight: 101.8 kg     Height:       Physical Exam General: Elderly female on nasal oxygen, nad  Heart:S1,S2 RRR Lungs:Wheezing improved, lungs clear  Abdomen: S, NT Extremities: 2+ BLE pitting edema Dialysis Access: L AVF + bruit    Additional Objective Labs: Basic Metabolic Panel: Recent Labs  Lab 06/12/20 1150 06/13/20 0628 06/14/20 0755  NA 136 138 137  K 3.9 4.0 3.9  CL 96* 98 98  CO2 27 29 23   GLUCOSE 106* 88 83  BUN 42* 27* 41*  CREATININE 7.70* 6.05* 7.36*  CALCIUM 7.0* 7.6* 7.9*  PHOS 5.5* 4.7* 5.2*   Liver Function Tests: Recent Labs  Lab 06/10/20 1650 06/10/20 1650 06/12/20 0512 06/12/20 0512 06/12/20 1150 06/13/20 0628 06/14/20 0755  AST 43*  --  32  --   --   --   --   ALT 43  --  36  --   --   --   --   ALKPHOS 275*  --  235*  --   --   --   --   BILITOT 2.1*  --  1.7*  --   --   --   --   PROT 6.7  --  6.1*  --   --   --   --   ALBUMIN 3.3*   < > 2.9*   < > 2.7* 3.0* 3.2*   < > = values in this interval not displayed.   Recent Labs  Lab 06/10/20 1650  LIPASE 44   CBC: Recent Labs  Lab 06/10/20 1610 06/10/20 1610 06/11/20 0245 06/11/20 0245 06/12/20 0512 06/13/20 0628 06/14/20 0755  WBC 7.4   < > 8.2   < > 5.9 4.9 5.2  HGB 12.1   < > 11.2*   < > 10.6* 10.4* 12.3  HCT 39.4   < > 36.2   < > 34.7* 34.6* 39.4  MCV 103.4*  --  100.0  --  101.2* 102.1* 101.3*  PLT 127*   < > 121*   < > 114* 114* 105*   < > = values in this interval not displayed.   Blood Culture    Component Value Date/Time   SDES PLEURAL FLUID 06/11/2020 0959   SDES PLEURAL  FLUID 06/11/2020 0959   SPECREQUEST NONE 06/11/2020 0959   SPECREQUEST NONE 06/11/2020 0959   CULT  06/11/2020 0959    NO GROWTH 3 DAYS Performed at [facility], [place] [address], [place], [place] [address]    REPTSTATUS 06/11/2020 FINAL 06/11/2020 0959   REPTSTATUS PENDING 06/11/2020 0959    Cardiac Enzymes: No results for input(s): CKTOTAL, CKMB, CKMBINDEX, TROPONINI in the last 168 hours. CBG: No results for input(s): GLUCAP in the last 168 hours. Iron Studies: No results for input(s): IRON, TIBC, TRANSFERRIN, FERRITIN in the last 72 hours. @lablastinr3 @ Studies/Results: US Abdomen Complete  Result Date: 06/12/2020 CLINICAL DATA:  Distension EXAM: ABDOMEN ULTRASOUND COMPLETE COMPARISON:  Ultrasound 06/11/2020 FINDINGS:  Gallbladder: Sludge and stones within the gallbladder. Slight increased wall thickness at 3.8 mm but negative sonographic Murphy. Common bile duct: Diameter: 3.6 mm Liver: No focal hepatic abnormality. Nodular hepatic contour. Portal vein is patent on color Doppler imaging with normal direction of blood flow towards the liver. IVC: No abnormality visualized. Pancreas: Visualized portion unremarkable. Spleen: Size and appearance within normal limits. Right Kidney: Length: 9.7 cm. Echogenicity within normal limits. No mass or hydronephrosis visualized. Left Kidney: Length: 10.7 cm. Echogenicity within normal limits. No mass or hydronephrosis visualized. Abdominal aorta: Not well visualized at the mid and distal segments. Other findings: Incidental note made of right pleural effusion. Small amount of ascites. IMPRESSION: 1. Nodular hepatic contour suspect for cirrhosis. Only trace amount of ascites is noted. 2. Gallstones and sludge. Nonspecific slight increased gallbladder wall thickness without sonographic Murphy. 3. Right pleural effusion Electronically Signed   By: Donavan Foil M.D.   On: 06/12/2020 19:04   Medications:  . allopurinol  300 mg Oral Daily  . apixaban   5 mg Oral BID  . ascorbic acid  500 mg Oral Daily  . atorvastatin  40 mg Oral Daily  . Chlorhexidine Gluconate Cloth  6 each Topical Daily  . doxycycline  100 mg Oral Q12H  . furosemide  80 mg Oral 2 times per day on Sun Tue Thu Sat  . loratadine  10 mg Oral Daily  . metoprolol succinate  25 mg Oral BID  . midodrine  10 mg Oral Once per day on Mon Wed Fri  . montelukast  10 mg Oral Daily    Dialysis Orders:  AF MWF 4h 180NRe 450/800 EDW 102.5kg 2K/2.25Ca UFP 2 L AVF Heparin 1000 Venofer 50 mg IV q week   Assessment/Plan: 1. Dyspnea/Right pleural effusion - s/p thoracentesis 06/12/2020 1.4L removed- exudate concerning for cirrhosis.  Antibiotics started for possible PNA -per primary.  2. Chronic Systolic HF/NICM- EF 16-83%. Continue lowering volume as tolerated with HD. PO Lasix on non HD days  3. ESRD -  HD MWF. For HD today on schedule. Considering idea of 4x week dialysis.   4. Hypertension/volume  - Chronic hypotension. On midodrine for BP support. Volume excess on exam. Continue lowering volume as tolerated. Net UF 3.3L post HD weight 100.2kg on  7/19 5. Anemia  - Hgb at goal. No ESA needs.  6. Metabolic bone disease -  Corr Ca low. Discontinue Sensipar for now. Use added Ca bath on HD.  7. Chronic Afib - on Eliquis/metoprolol  8. Cirrhosis on [place] Ab --Hep studies neg. F/u w GI as outpatient.   Lynnda Child PA-C [facility] 06/14/2020,10:03 AM

## 2020-06-14 NOTE — Evaluation (Signed)
Occupational Therapy Evaluation Patient Details Name: Melissa Adams MRN: [id number] DOB: [birth year] Today's Date: 06/14/2020    History of Present Illness 74 yo admitted with recurrent pleural effusion with SOB. PMHx; ESRD MWF, heart failure with EF of 30%, AFib, OSA, HTN, gout, Covid 19   Clinical Impression   PTA patient was living with her daughter and son-in-law and was independent with BADLs including bathing in sitting, dressing, toileting and grooming in standing at sink level. Patient reports daughter was responsible for IADLs including meal prep and homemaking. Patient currently presents near baseline level of function requiring grossly supervision A for functional transfers and mobility, set-up assist for UB BADLs, and Min A for LB BADLs including dressing and bathing without use of AE. Education provided on safety with self-care tasks upon return home and AE including long handled sponge and reacher to increase safety and independence with LB bathing/dressing tasks. OT also provided education on use of DME with mobility 2/2 decreased dynamic standing balance. Patient expressed verbal understanding. Patient would benefit from continued acute OT services in prep for safe d/c home. No follow-up recommendations at this time.     Follow Up Recommendations  No OT follow up    Equipment Recommendations  None recommended by OT    Recommendations for Other Services       Precautions / Restrictions Precautions Precautions: Fall Precaution Comments: watch sats Restrictions Weight Bearing Restrictions: No      Mobility Bed Mobility Overal bed mobility: Modified Independent             General bed mobility comments: Patient seated EOB upon entry.  Transfers Overall transfer level: Needs assistance   Transfers: Sit to/from Stand Sit to Stand: Supervision         General transfer comment: supervision for safety    Balance Overall balance assessment: Mild deficits  observed, not formally tested                                         ADL either performed or assessed with clinical judgement   ADL Overall ADL's : Needs assistance/impaired Eating/Feeding: Independent   Grooming: Supervision/safety;Standing;Sitting   Upper Body Bathing: Set up;Sitting   Lower Body Bathing: Minimal assistance;Sit to/from stand;Sitting/lateral leans   Upper Body Dressing : Set up;Sitting   Lower Body Dressing: Minimal assistance;Sitting/lateral leans;Sit to/from stand   Toilet Transfer: Min guard (Hand-held assist) Toilet Transfer Details (indicate cue type and reason): Simulated with transfer to recliner         Functional mobility during ADLs: Supervision/safety (Without use of AD. )       Vision Baseline Vision/History: Wears glasses Wears Glasses: At all times Patient Visual Report: No change from baseline Vision Assessment?: No apparent visual deficits     Perception     Praxis      Pertinent Vitals/Pain Pain Assessment: No/denies pain     Hand Dominance Right   Extremity/Trunk Assessment Upper Extremity Assessment Upper Extremity Assessment: Overall WFL for tasks assessed   Lower Extremity Assessment Lower Extremity Assessment: Overall WFL for tasks assessed       Communication Communication Communication: No difficulties   Cognition Arousal/Alertness: Awake/alert Behavior During Therapy: WFL for tasks assessed/performed Overall Cognitive Status: Within Functional Limits for tasks assessed  General Comments       Exercises     Shoulder Instructions      Home Living Family/patient expects to be discharged to:: Private residence Living Arrangements: Children Available Help at Discharge: Available 24 hours/day;Family Type of Home: Mobile home Home Access: Stairs to enter Entrance Stairs-Number of Steps: 5 Entrance Stairs-Rails: None Home Layout: One  level     Bathroom Shower/Tub: Teacher, early years/pre: Standard     Home Equipment: Toilet riser;Cane - single point;Walker - 4 wheels          Prior Functioning/Environment Level of Independence: Independent        Comments: pt uses a cane when she goes to HD        OT Problem List: Impaired balance (sitting and/or standing)      OT Treatment/Interventions:      OT Goals(Current goals can be found in the care plan section) Acute Rehab OT Goals Patient Stated Goal: To return home.  OT Goal Formulation: With patient Time For Goal Achievement: 06/28/20 Potential to Achieve Goals: Good ADL Goals Pt Will Perform Lower Body Dressing: sitting/lateral leans;sit to/from stand;with adaptive equipment;with modified independence Additional ADL Goal #1: Patient will demonstrate 3 energy conservation techniques during BADLs in prep for safe return home.  OT Frequency:     Barriers to D/C:            Co-evaluation              AM-PAC OT "6 Clicks" Daily Activity     Outcome Measure Help from another person eating meals?: None Help from another person taking care of personal grooming?: A Little Help from another person toileting, which includes using toliet, bedpan, or urinal?: A Little Help from another person bathing (including washing, rinsing, drying)?: A Little Help from another person to put on and taking off regular upper body clothing?: None Help from another person to put on and taking off regular lower body clothing?: A Little 6 Click Score: 20   End of Session Equipment Utilized During Treatment: Gait belt  Activity Tolerance: Patient tolerated treatment well Patient left: in chair;with call bell/phone within reach;with chair alarm set  OT Visit Diagnosis: Unsteadiness on feet (R26.81)                Time: 2244-9753 OT Time Calculation (min): 13 min Charges:  OT General Charges $OT Visit: 1 Visit OT Evaluation $OT Eval Low Complexity: 1  Low  Nyima Vanacker H. OTR/L Supplemental OT, Department of rehab services [phone]  Kresta Templeman R H. 06/14/2020, 11:12 AM

## 2020-06-14 NOTE — Progress Notes (Signed)
  Mobility Specialist Criteria Algorithm Info.  SATURATION QUALIFICATIONS: (This note is used to comply with regulatory documentation for home oxygen)  Patient Saturations on Room Air at Rest = 90%  Patient Saturations on Room Air while Ambulating = 88%  Patient Saturations on 2 Liters of oxygen while Ambulating = 97%  Please briefly explain why patient needs home oxygen:  Mobility Team:  [facility] elevated:Self regulated Activity: Ambulated in hall;Dangled on edge of bed Range of motion: Active;All extremities Level of assistance: Standby assist, set-up cues, supervision of patient - no hands on Assistive device: None;Other (Comment) (Declined RW and cane) Minutes sitting in chair:  Minutes stood: 5 minutes Minutes ambulated: 5 minutes Distance ambulated (ft): 200 ft Mobility response: Tolerated well;RN notified Bed Position: Semi-fowlers (Dangle EOB)  Pt tolerated ambulation well but fatigue and pain in her back and LE's limited distance. Initially upon standing pt was slightly unsteady but with taking a few steps the unsteadiness subsided. Pt ambulated in hallway 269ft and required 2 standing rest breaks due to pain/fatigue. Pt also required 2LO2 to maintain an oxygen saturation >90%.   HR Pre: 110 HR During: 130+ HR Post: 120-126  06/14/2020 9:15 AM

## 2020-06-15 ENCOUNTER — Telehealth: Payer: Self-pay | Admitting: Nephrology

## 2020-06-15 ENCOUNTER — Telehealth: Payer: Self-pay

## 2020-06-15 NOTE — Telephone Encounter (Signed)
Transition Care Management Follow-up Telephone Call   Call completed with daughter, Melissa Adams, [place].  Patient was asleep  Date of discharge and from where: 06/14/2020, [facility]   How have you been since you were released from the hospital? She was asleep at the time of this call.  No concerns reported by daughter.   Any questions or concerns? Melissa Adams has questions about the atorvastatin. She said that the patient's provider in [place] had taken her off of atorvastatin about 2 years ago and then put her back on it.  She explained that her mother now has cirrhosis of liver, non alcohol related and Melissa Adams is wondering if her mother should be taken off of the atorvastatin. Informed her that PCP would be notified of this question.  She is also requesting a pulse oximeter stating that the hospital staff told her that her mother's O2 sat should be around 92 and she has no way to check this.   Items Reviewed:  Did the pt receive and understand the discharge instructions provided?  yes  Medications obtained and verified?  Melissa Adams said that they have all of the medication. Question about atorvastatin noted above. Melissa Adams sets up a medication box for patient and her husband reminds patient to take her meds. No other questions about med regime  Any new allergies since your discharge?  none reported   Do you have support at home? yes, lives with her daughter, Melissa Adams and Melissa Adams's husband. Small apartment  No home health ordered.  Attends HD [facility] M[facility]W/F.  Family provides transportation  Has O2 from [employer]. Using it at 2 L when moving around and at night. They have a POC and back up tanks and are waiting for [employer] to deliver a room concentrator.   Functional Questionnaire: (I = Independent and D = Dependent) ADLs: family provides needed assistance. Has cane, rollator and shower chair Follow up appointments reviewed:   PCP Hospital f/u appt confirmed?[name], NP  06/22/2020  Specialist Hospital f/u appt confirmed?   Sleep study -06/25/2020; GI 07/27/2020; cardiology - 08/15/2020  Are transportation arrangements needed? no  If their condition worsens, is the pt aware to call PCP or go to the Emergency Dept.?  yes  Was the patient provided with contact information for the PCP's office or ED?  she has the clinic phone number  Was to pt encouraged to call back with questions or concerns? yes

## 2020-06-15 NOTE — Telephone Encounter (Signed)
Transition of care contact from inpatient facility  Date of Discharge: 06/14/20 Date of Contact: 06/15/20 Method of contact: Phone  Attempted to contact patient to discuss transition of care from inpatient admission. Patient did not answer the phone. Message was left on the patient's voicemail with call back number [phone].

## 2020-06-16 LAB — CULTURE, BODY FLUID W GRAM STAIN -BOTTLE: Culture: NO GROWTH

## 2020-06-19 ENCOUNTER — Telehealth: Payer: Self-pay | Admitting: Nephrology

## 2020-06-19 NOTE — Telephone Encounter (Signed)
Transition of Care Contact from [place]   Date of Discharge: 06/14/20  Date of Contact: 06/19/20 Method of contact: phone Talked to daughter   Patient contacted to discuss transition of care form recent hospitaliztion. Patient was admitted to [facility] from 06/10/20 to 06/19/20 with the discharge diagnosis of     Medication changes were reviewed.  Patient will follow up with is outpatient dialysis center 06/21/20.  Other follow up needs include follow up with GI on 08/01/20   Jen Mow, PA-C [facility] Pager: [phone]

## 2020-06-21 ENCOUNTER — Inpatient Hospital Stay (INDEPENDENT_AMBULATORY_CARE_PROVIDER_SITE_OTHER): Payer: Medicare HMO | Admitting: Primary Care

## 2020-06-22 ENCOUNTER — Ambulatory Visit (INDEPENDENT_AMBULATORY_CARE_PROVIDER_SITE_OTHER): Payer: Medicare HMO | Admitting: Primary Care

## 2020-06-22 ENCOUNTER — Encounter (INDEPENDENT_AMBULATORY_CARE_PROVIDER_SITE_OTHER): Payer: Self-pay | Admitting: Primary Care

## 2020-06-22 ENCOUNTER — Other Ambulatory Visit: Payer: Self-pay

## 2020-06-22 VITALS — BP 97/65 | HR 48 | Temp 97.8°F | Ht 63.0 in | Wt 222.0 lb

## 2020-06-22 DIAGNOSIS — R7303 Prediabetes: Secondary | ICD-10-CM

## 2020-06-22 DIAGNOSIS — I5023 Acute on chronic systolic (congestive) heart failure: Secondary | ICD-10-CM

## 2020-06-22 DIAGNOSIS — J9601 Acute respiratory failure with hypoxia: Secondary | ICD-10-CM

## 2020-06-22 DIAGNOSIS — Z09 Encounter for follow-up examination after completed treatment for conditions other than malignant neoplasm: Secondary | ICD-10-CM

## 2020-06-22 DIAGNOSIS — Z1211 Encounter for screening for malignant neoplasm of colon: Secondary | ICD-10-CM

## 2020-06-22 DIAGNOSIS — Z1231 Encounter for screening mammogram for malignant neoplasm of breast: Secondary | ICD-10-CM

## 2020-06-22 DIAGNOSIS — Z23 Encounter for immunization: Secondary | ICD-10-CM

## 2020-06-22 DIAGNOSIS — N186 End stage renal disease: Secondary | ICD-10-CM | POA: Diagnosis not present

## 2020-06-22 DIAGNOSIS — Z992 Dependence on renal dialysis: Secondary | ICD-10-CM

## 2020-06-22 LAB — POCT GLYCOSYLATED HEMOGLOBIN (HGB A1C): Hemoglobin A1C: 5.2 % (ref 4.0–5.6)

## 2020-06-22 NOTE — Patient Instructions (Addendum)
Patient education: Raynaud phenomenon (Beyond the Basics) Author: Melody Comas, MD Section Editor: Carmon Ginsberg, MD, [place], [facility] Deputy Editor: Sherilyn Cooter, MD, MPH Contributor Disclosures All topics are updated as new evidence becomes available and our peer review process is complete. Literature review current through:Jun 2021. This topic last updated:Dec 15, 2018. OVERVIEW--The Raynaud phenomenon (RP) is a condition in which some of the body's blood vessels (most commonly those in the fingers and toes) constrict in an exaggerated way in response to cold or emotional stress. Normally, the vessels that supply blood to the skin constrict or narrow in response to cold temperatures. This reaction, called "vasoconstriction," decreases blood flow to the skin, which helps to minimize heat loss from the warm blood and therefore preserve a normal internal or "core" temperature. In warm temperatures, these same blood vessels dilate or widen, increasing the flow of blood to the skin surface, thus allowing heat to leave the body, and keeping the core body temperature from rising to a dangerous level. The blood vessels in the skin that react to temperature changes are called "thermoregulatory" vessels. They are primarily controlled by the sympathetic nervous system, which is the same system that reacts when we are stressed or upset emotionally. This explains why both cold and emotional stress can trigger vasoconstriction of these blood vessels, causing cold fingers and toes. However, the control of the thermoregulatory vessels is complex, with other factors that regulate their response to cold or warm temperatures. These include nerve receptors in the skin that sense local temperature (for example, when a cold object touches the skin) as well as molecules produced by cells lining the walls of the blood vessels (which constrict or dilate the blood vessels and alter blood flow to the skin).  As a result, cold hands and feet can occur normally after a person is exposed to local cold temperature touching the skin, environmental cold temperature that chills the whole body, or increased sympathetic activity from emotional stress. In people with RP, the mechanisms that control vasoconstriction are thought to be altered or defective. The vessels constrict in an exaggerated way in response to cold and emotional or physical stress, causing what is called Raynaud's phenomenon or a Raynaud's attack. There are three phases of the event represented by skin color changes. First, severe vasoconstriction reduces blood flow to the skin of the affected areas, causing the skin surface to feel cold to touch and to have a white color. The pale white color is due to virtually no blood flow to the skin. The skin then typically becomes a purplish-blue color (called acrocyanosis), as a reduced flow of blood through the skin returns. When the vessel fully recovers, it dilates, allowing blood flow to resume; the skin may blush, becoming very pink or red. RAYNAUD PHENOMENON RISK FACTORS--It is estimated that Raynaud phenomenon (RP) affects 3 to 5 percent of the general population. Primary disease--People who have RP without a related disease are said to have primary RP. The underlying reason for blood vessel sensitivity to cold is uncertain. Primary RP may be a family trait, suggesting that one or more genes that regulate skin blood flow may be responsible. Primary RP accounts for the majority of cases and is more common among women, younger age groups (ie, people under 15), and family members of people with RP. Fortunately, most people with primary RP are not significantly disabled by the condition and respond well to treatment. They are more likely than people with secondary disease to  manage with simple non-drug interventions (see 'Secondary disease' below). Primary RP often improves with time, and people learn to  avoid or prepare for cold and emotional triggers. Secondary disease--People with an underlying disease or defined cause of RP are classified as having secondary RP. There are several known causes of secondary RP. The autoimmune rheumatic diseases, such as scleroderma or systemic lupus erythematosus, represent a common cause. These diseases cause a secondary injury that alters the blood vessels and, as a consequence, the injury affects how they react to stimuli such as cold and stress. (See "Patient education: Systemic lupus erythematosus (Beyond the Basics)".) Secondary RP can be more difficult to manage than primary RP because it is linked to an underlying condition that can physically damage the blood vessel. People with secondary RP are more likely to need medications to manage their symptoms. RAYNAUD PHENOMENON CAUSES--The normal control of blood vessel responses to cold and other stimuli is complex, involving the central nervous system, peripheral sensory nerves, and molecules released by circulating cells or from the inner lining of the blood vessel itself (called the endothelium). Raynaud phenomenon (RP) occurs when this complex and delicate system of control is disrupted, which alters the normal responses to the environment. A Raynaud attack can be triggered by exposure to cold temperature or even by a shift in temperature from warm to cool. As a result, even mildly cold exposures, such as those caused by air conditioning or the cold of the refrigerated food section in a grocery store, can cause an attack. Experiencing a general body chill can trigger an attack, even if the hands and feet are kept warm. Feeling emotional stress and being startled can cause an attack of RP due to the release of nerve transmitter substances; these substances activate certain proteins in the blood vessels, which signal the vessels to narrow (constrict). RAYNAUD PHENOMENON SYMPTOMS--Most often, a Raynaud attack  affects the blood vessels in the fingers. In a typical attack, the fingers (or toes) become suddenly cold as the blood vessels constrict. The skin color changes markedly and may become pale (called a "white attack") or a purple or blue color (called a "blue attack") (picture 1). Usually, an attack of RP begins in a single finger and then spreads to other fingers in both hands. The color changes often affect the whole finger from tip to base. The index, middle, and ring fingers are most commonly involved, while the thumb is often not affected. An attack can cause discomfort, including a "pins and needles" feeling, aching, numbness, or clumsiness of the affected hand(s). The feeling of true pain occurs more often in secondary RP and is caused by a prolonged loss of blood flow to the tissues. Blood vessels supplying the skin of the ears, nose, face, knees, and nipples can also be affected, and the skin in these areas may become pale or bluish in color after cold exposure. Mottling (a bluish discoloration) of the skin of the arms and legs might also appear. Attacks affecting the toes are also common, although people tend to complain of these less frequently. Symptoms of RP resolve as the provoking factor (cold or stress) is removed. After leaving the cold area and rewarming, the discoloration resolves after 15 to 20 minutes, and, as normal blood flow resumes, the skin "blushes" or becomes pink. During the recovery phase, the person may experience mild discomfort. People with severe secondary Raynaud phenomenon (RP) can sometimes experience a serious decrease in blood flow that does not resolve even after the  provoking factor or cold is removed. This is due to underlying damage to the blood vessels or irreversible structural changes such as a blockage in the vessel that obstructs blood flow. Pain and ulceration of the skin (usually on the tips of the fingers and toes) can result. In severe cases, deeper tissue injury  can occur that can lead to loss of the tips of the fingers in some people with a secondary vascular disease causing RP. RAYNAUD PHENOMENON DIAGNOSIS--Raynaud phenomenon (RP) is diagnosed based on the person's description of a typical attack following cold exposure. By asking questions, a health care provider can usually tell whether RP or another more common condition is causing cold hands or feet. It is most important to have a full evaluation by a health care provider to determine if RP is happening and the underlying cause of the RP. This requires a good history and physical examination and laboratory testing that may include blood tests and, on occasion, special studies of the blood vessels and their function. RAYNAUD PHENOMENON TREATMENT--There are several simple measures that can reduce the frequency of Raynaud attacks. Medicine might also be used to help control the symptoms. ?Avoid sudden cold exposure - Use strategies to keep the whole body warm and avoid rapidly shifting temperature, cold breezes, and damp cold conditions. These include dressing warmly, wearing layer clothing such as thermal underwear, donning a hat, and using mittens or gloves. ?Help end an attack - Methods include placing the hands under warm water or in a warm place (such as in the armpits) or rotating the arms in a whirling windmill pattern. ?Avoid smoking - Nicotine and other chemicals in cigarettes cause the blood vessels to constrict and can aggravate Raynaud phenomenon (RP). (See "Patient education: Quitting smoking (Beyond the Basics)".) ?Avoid medications that cause vasoconstriction (narrowing of the blood vessels) - Such medications include decongestants containing phenylephrine or pseudoephedrine, other amphetamines, diet pills, some migraine remedies containing ergotamine, herbs containing ephedra, and medications used to treat attention deficit disorder (ADD) such as methylphenidate, dextroamphetamine-amphetamine,  and atomoxetine. ?Reduce stress - Reduce stress and use relaxation techniques to reduce anxiety. Medicine--If the measures above are not sufficient, medication might be recommended. The medications used to treat RP work by opening (vasodilating) the involved thermoregulatory vessels. Medicines called calcium channel blockers are the most commonly used. They are taken daily and can reduce both the frequency and the severity of Raynaud attacks. Secondary Raynaud phenomenon--More aggressive treatment is sometimes needed for people with secondary RP. Most people need a medicine to reduce the frequency of attacks and prevent injury to the skin on the fingers and toes. The response to medication in secondary RP varies a great deal depending on the underlying cause and severity of any blood vessel damage. Treatment directed at the underlying cause or aggravating factor (eg, stopping a drug if that is what is causing vasoconstriction) is the most important goal. Several medicines have been studied for people who do not respond to calcium channel blockers. These include nitrates (topical nitroglycerin), phosphodiesterase inhibitors (sildenafil), or prostaglandins (prostacyclin). Botulinum toxin (brand name: Botox) locally injected at the base of the fingers can also be used, but the use of botulinum toxin for RP needs more study. Hospitalization may be required if an attack of RP does not resolve and if blood flow is seriously restricted and threatens loss of a finger or toe. Treatment in this situation requires additional medications to dilate the blood vessels, improve blood flow, and prevent tissue injury or ulcerations. In  some cases, surgery may be used to cut certain nerves at the base of the finger that trigger vasoconstriction or to repair a blocked blood vessel supplying the affected area. In rare instances, treatment may be ineffective in reversing the vasoconstriction. This can lead to ulceration  (breaking down) of the skin. If all other treatments have failed and if deep tissue injury has occurred due to lack of blood flow, surgical amputation of the affected finger or toe may be necessary. WHERE TO GET MORE INFORMATION--Your health care provider is the best source of information for questions and concerns related to your medical problem. This article will be updated as needed on our web site ([email or web address]). Related topics for patients, as well as selected articles written for healthcare professionals, are also available. Some of the most relevant are listed below. Patient level information--UpToDate offers two types of patient education materials. The Basics--The Basics patient education pieces answer the four or five key questions a patient might have about a given condition. These articles are best for patients who want a general overview and who prefer short, easy-to-read materials. Patient education: Raynaud disease (The Basics) Beyond the [facility] the Basics patient education pieces are longer, more sophisticated, and more detailed. These articles are best for patients who want in-depth information and are comfortable with some medical jargon. Patient education: Systemic lupus erythematosus (Beyond the Basics) Patient education: Quitting smoking (Beyond the Basics)  Professional level information--Professional level articles are designed to keep doctors and other health professionals up-to-date on the latest medical findings. These articles are thorough, long, and complex, and they contain multiple references to the research on which they are based. Professional level articles are best for people who are comfortable with a lot of medical terminology and who want to read the same materials their doctors are reading. Clinical manifestations and diagnosis of Raynaud phenomenon Treatment of Raynaud phenomenon: Initial management Pathogenesis of Raynaud  phenomenon Treatment of Raynaud phenomenon: Refractory or progressive ischemia  The following organizations also provide reliable health information. ?The [place] (NIH) (TourneyLocator.at.html) ?The [employer] of Arthritis and Musculoskeletal and Skin Diseases (www.niams.SouthExposed.es) ?The Arthritis Foundation (http://www.deleon.com/) ?The Arthritis Society Patient education: Raynaud phenomenon (Beyond the Basics) Author: Melody Comas, MD Section Editor: Carmon Ginsberg, MD, [employer], [facility] Deputy Editor: Sherilyn Cooter, MD, MPH Contributor Disclosures All topics are updated as new evidence becomes available and our peer review process is complete. Literature review current through:Jun 2021. This topic last updated:Dec 15, 2018. OVERVIEW--The Raynaud phenomenon (RP) is a condition in which some of the body's blood vessels (most commonly those in the fingers and toes) constrict in an exaggerated way in response to cold or emotional stress. Normally, the vessels that supply blood to the skin constrict or narrow in response to cold temperatures. This reaction, called "vasoconstriction," decreases blood flow to the skin, which helps to minimize heat loss from the warm blood and therefore preserve a normal internal or "core" temperature. In warm temperatures, these same blood vessels dilate or widen, increasing the flow of blood to the skin surface, thus allowing heat to leave the body, and keeping the core body temperature from rising to a dangerous level. The blood vessels in the skin that react to temperature changes are called "thermoregulatory" vessels. They are primarily controlled by the sympathetic nervous system, which is the same system that reacts when we are stressed or upset emotionally. This explains why both cold and emotional stress can trigger vasoconstriction of these blood vessels,  causing cold  fingers and toes. However, the control of the thermoregulatory vessels is complex, with other factors that regulate their response to cold or warm temperatures. These include nerve receptors in the skin that sense local temperature (for example, when a cold object touches the skin) as well as molecules produced by cells lining the walls of the blood vessels (which constrict or dilate the blood vessels and alter blood flow to the skin). As a result, cold hands and feet can occur normally after a person is exposed to local cold temperature touching the skin, environmental cold temperature that chills the whole body, or increased sympathetic activity from emotional stress. In people with RP, the mechanisms that control vasoconstriction are thought to be altered or defective. The vessels constrict in an exaggerated way in response to cold and emotional or physical stress, causing what is called Raynaud's phenomenon or a Raynaud's attack. There are three phases of the event represented by skin color changes. First, severe vasoconstriction reduces blood flow to the skin of the affected areas, causing the skin surface to feel cold to touch and to have a white color. The pale white color is due to virtually no blood flow to the skin. The skin then typically becomes a purplish-blue color (called acrocyanosis), as a reduced flow of blood through the skin returns. When the vessel fully recovers, it dilates, allowing blood flow to resume; the skin may blush, becoming very pink or red. RAYNAUD PHENOMENON RISK FACTORS--It is estimated that Raynaud phenomenon (RP) affects 3 to 5 percent of the general population. Primary disease--People who have RP without a related disease are said to have primary RP. The underlying reason for blood vessel sensitivity to cold is uncertain. Primary RP may be a family trait, suggesting that one or more genes that regulate skin blood flow may be responsible. Primary RP accounts for the  majority of cases and is more common among women, younger age groups (ie, people under 39), and family members of people with RP. Fortunately, most people with primary RP are not significantly disabled by the condition and respond well to treatment. They are more likely than people with secondary disease to manage with simple non-drug interventions (see 'Secondary disease' below). Primary RP often improves with time, and people learn to avoid or prepare for cold and emotional triggers. Secondary disease--People with an underlying disease or defined cause of RP are classified as having secondary RP. There are several known causes of secondary RP. The autoimmune rheumatic diseases, such as scleroderma or systemic lupus erythematosus, represent a common cause. These diseases cause a secondary injury that alters the blood vessels and, as a consequence, the injury affects how they react to stimuli such as cold and stress. (See "Patient education: Systemic lupus erythematosus (Beyond the Basics)".) Secondary RP can be more difficult to manage than primary RP because it is linked to an underlying condition that can physically damage the blood vessel. People with secondary RP are more likely to need medications to manage their symptoms. RAYNAUD PHENOMENON CAUSES--The normal control of blood vessel responses to cold and other stimuli is complex, involving the central nervous system, peripheral sensory nerves, and molecules released by circulating cells or from the inner lining of the blood vessel itself (called the endothelium). Raynaud phenomenon (RP) occurs when this complex and delicate system of control is disrupted, which alters the normal responses to the environment. A Raynaud attack can be triggered by exposure to cold temperature or even by a shift in temperature from  warm to cool. As a result, even mildly cold exposures, such as those caused by air conditioning or the cold of the refrigerated food section  in a grocery store, can cause an attack. Experiencing a general body chill can trigger an attack, even if the hands and feet are kept warm. Feeling emotional stress and being startled can cause an attack of RP due to the release of nerve transmitter substances; these substances activate certain proteins in the blood vessels, which signal the vessels to narrow (constrict). RAYNAUD PHENOMENON SYMPTOMS--Most often, a Raynaud attack affects the blood vessels in the fingers. In a typical attack, the fingers (or toes) become suddenly cold as the blood vessels constrict. The skin color changes markedly and may become pale (called a "white attack") or a purple or blue color (called a "blue attack") (picture 1). Usually, an attack of RP begins in a single finger and then spreads to other fingers in both hands. The color changes often affect the whole finger from tip to base. The index, middle, and ring fingers are most commonly involved, while the thumb is often not affected. An attack can cause discomfort, including a "pins and needles" feeling, aching, numbness, or clumsiness of the affected hand(s). The feeling of true pain occurs more often in secondary RP and is caused by a prolonged loss of blood flow to the tissues. Blood vessels supplying the skin of the ears, nose, face, knees, and nipples can also be affected, and the skin in these areas may become pale or bluish in color after cold exposure. Mottling (a bluish discoloration) of the skin of the arms and legs might also appear. Attacks affecting the toes are also common, although people tend to complain of these less frequently. Symptoms of RP resolve as the provoking factor (cold or stress) is removed. After leaving the cold area and rewarming, the discoloration resolves after 15 to 20 minutes, and, as normal blood flow resumes, the skin "blushes" or becomes pink. During the recovery phase, the person may experience mild discomfort. People with severe  secondary Raynaud phenomenon (RP) can sometimes experience a serious decrease in blood flow that does not resolve even after the provoking factor or cold is removed. This is due to underlying damage to the blood vessels or irreversible structural changes such as a blockage in the vessel that obstructs blood flow. Pain and ulceration of the skin (usually on the tips of the fingers and toes) can result. In severe cases, deeper tissue injury can occur that can lead to loss of the tips of the fingers in some people with a secondary vascular disease causing RP. RAYNAUD PHENOMENON DIAGNOSIS--Raynaud phenomenon (RP) is diagnosed based on the person's description of a typical attack following cold exposure. By asking questions, a health care provider can usually tell whether RP or another more common condition is causing cold hands or feet. It is most important to have a full evaluation by a health care provider to determine if RP is happening and the underlying cause of the RP. This requires a good history and physical examination and laboratory testing that may include blood tests and, on occasion, special studies of the blood vessels and their function. RAYNAUD PHENOMENON TREATMENT--There are several simple measures that can reduce the frequency of Raynaud attacks. Medicine might also be used to help control the symptoms. ?Avoid sudden cold exposure - Use strategies to keep the whole body warm and avoid rapidly shifting temperature, cold breezes, and damp cold conditions. These include dressing warmly, wearing  layer clothing such as thermal underwear, donning a hat, and using mittens or gloves. ?Help end an attack - Methods include placing the hands under warm water or in a warm place (such as in the armpits) or rotating the arms in a whirling windmill pattern. ?Avoid smoking - Nicotine and other chemicals in cigarettes cause the blood vessels to constrict and can aggravate Raynaud phenomenon (RP). (See "Patient  education: Quitting smoking (Beyond the Basics)".) ?Avoid medications that cause vasoconstriction (narrowing of the blood vessels) - Such medications include decongestants containing phenylephrine or pseudoephedrine, other amphetamines, diet pills, some migraine remedies containing ergotamine, herbs containing ephedra, and medications used to treat attention deficit disorder (ADD) such as methylphenidate, dextroamphetamine-amphetamine, and atomoxetine. ?Reduce stress - Reduce stress and use relaxation techniques to reduce anxiety. Medicine--If the measures above are not sufficient, medication might be recommended. The medications used to treat RP work by opening (vasodilating) the involved thermoregulatory vessels. Medicines called calcium channel blockers are the most commonly used. They are taken daily and can reduce both the frequency and the severity of Raynaud attacks. Secondary Raynaud phenomenon--More aggressive treatment is sometimes needed for people with secondary RP. Most people need a medicine to reduce the frequency of attacks and prevent injury to the skin on the fingers and toes. The response to medication in secondary RP varies a great deal depending on the underlying cause and severity of any blood vessel damage. Treatment directed at the underlying cause or aggravating factor (eg, stopping a drug if that is what is causing vasoconstriction) is the most important goal. Several medicines have been studied for people who do not respond to calcium channel blockers. These include nitrates (topical nitroglycerin), phosphodiesterase inhibitors (sildenafil), or prostaglandins (prostacyclin). Botulinum toxin (brand name: Botox) locally injected at the base of the fingers can also be used, but the use of botulinum toxin for RP needs more study. Hospitalization may be required if an attack of RP does not resolve and if blood flow is seriously restricted and threatens loss of a finger or toe.  Treatment in this situation requires additional medications to dilate the blood vessels, improve blood flow, and prevent tissue injury or ulcerations. In some cases, surgery may be used to cut certain nerves at the base of the finger that trigger vasoconstriction or to repair a blocked blood vessel supplying the affected area. In rare instances, treatment may be ineffective in reversing the vasoconstriction. This can lead to ulceration (breaking down) of the skin. If all other treatments have failed and if deep tissue injury has occurred due to lack of blood flow, surgical amputation of the affected finger or toe may be necessary. WHERE TO GET MORE INFORMATION--Your health care provider is the best source of information for questions and concerns related to your medical problem. This article will be updated as needed on our web site ([email or web address]). Related topics for patients, as well as selected articles written for healthcare professionals, are also available. Some of the most relevant are listed below. Patient level information--UpToDate offers two types of patient education materials. The Basics--The Basics patient education pieces answer the four or five key questions a patient might have about a given condition. These articles are best for patients who want a general overview and who prefer short, easy-to-read materials. Patient education: Raynaud disease (The Basics) Beyond the [facility] the Basics patient education pieces are longer, more sophisticated, and more detailed. These articles are best for patients who want in-depth information and are comfortable with some medical jargon. Patient education:  Systemic lupus erythematosus (Beyond the Basics) Patient education: Quitting smoking (Beyond the Basics)  Professional level information--Professional level articles are designed to keep doctors and other health professionals up-to-date on the latest medical findings. These  articles are thorough, long, and complex, and they contain multiple references to the research on which they are based. Professional level articles are best for people who are comfortable with a lot of medical terminology and who want to read the same materials their doctors are reading. pPneumococcal Conjugate Vaccine suspension for injection What is this medicine? PNEUMOCOCCAL VACCINE (NEU mo KOK al vak SEEN) is a vaccine used to prevent pneumococcus bacterial infections. These bacteria can cause serious infections like pneumonia, meningitis, and blood infections. This vaccine will lower your chance of getting pneumonia. If you do get pneumonia, it can make your symptoms milder and your illness shorter. This vaccine will not treat an infection and will not cause infection. This vaccine is recommended for infants and young children, adults with certain medical conditions, and adults 53 years or older. This medicine may be used for other purposes; ask your health care provider or pharmacist if you have questions. COMMON BRAND NAME(S): Prevnar, Prevnar 13 What should I tell my health care provider before I take this medicine? They need to know if you have any of these conditions:  bleeding problems  fever  immune system problems  an unusual or allergic reaction to pneumococcal vaccine, diphtheria toxoid, other vaccines, latex, other medicines, foods, dyes, or preservatives  pregnant or trying to get pregnant  breast-feeding How should I use this medicine? This vaccine is for injection into a muscle. It is given by a health care professional. A copy of Vaccine Information Statements will be given before each vaccination. Read this sheet carefully each time. The sheet may change frequently. Talk to your pediatrician regarding the use of this medicine in children. While this drug may be prescribed for children as young as 10 weeks old for selected conditions, precautions do apply. Overdosage: If  you think you have taken too much of this medicine contact a poison control center or emergency room at once. NOTE: This medicine is only for you. Do not share this medicine with others. What if I miss a dose? It is important not to miss your dose. Call your doctor or health care professional if you are unable to keep an appointment. What may interact with this medicine?  medicines for cancer chemotherapy  medicines that suppress your immune function  steroid medicines like prednisone or cortisone This list may not describe all possible interactions. Give your health care provider a list of all the medicines, herbs, non-prescription drugs, or dietary supplements you use. Also tell them if you smoke, drink alcohol, or use illegal drugs. Some items may interact with your medicine. What should I watch for while using this medicine? Mild fever and pain should go away in 3 days or less. Report any unusual symptoms to your doctor or health care professional. What side effects may I notice from receiving this medicine? Side effects that you should report to your doctor or health care professional as soon as possible:  allergic reactions like skin rash, itching or hives, swelling of the face, lips, or tongue  breathing problems  confused  fast or irregular heartbeat  fever over 102 degrees F  seizures  unusual bleeding or bruising  unusual muscle weakness Side effects that usually do not require medical attention (report to your doctor or health care professional if they  continue or are bothersome):  aches and pains  diarrhea  fever of 102 degrees F or less  headache  irritable  loss of appetite  pain, tender at site where injected  trouble sleeping This list may not describe all possible side effects. Call your doctor for medical advice about side effects. You may report side effects to FDA at 1-800-FDA-1088. Where should I keep my medicine? This does not apply. This  vaccine is given in a clinic, pharmacy, doctor's office, or other health care setting and will not be stored at home. NOTE: This sheet is a summary. It may not cover all possible information. If you have questions about this medicine, talk to your doctor, pharmacist, or health care provider.  2020 Elsevier/Gold Standard (2014-08-18 10:27:27) enhttps://www.cdc.gov/vaccines/hcp/vis/vis-statements/tdap.pdf">  Tdap (Tetanus, Diphtheria, Pertussis) Vaccine: What You Need to Know 1. Why get vaccinated? Tdap vaccine can prevent tetanus, diphtheria, and pertussis. Diphtheria and pertussis spread from person to person. Tetanus enters the body through cuts or wounds.  TETANUS (T) causes painful stiffening of the muscles. Tetanus can lead to serious health problems, including being unable to open the mouth, having trouble swallowing and breathing, or death.  DIPHTHERIA (D) can lead to difficulty breathing, heart failure, paralysis, or death.  PERTUSSIS (aP), also known as "whooping cough," can cause uncontrollable, violent coughing which makes it hard to breathe, eat, or drink. Pertussis can be extremely serious in babies and young children, causing pneumonia, convulsions, brain damage, or death. In teens and adults, it can cause weight loss, loss of bladder control, passing out, and rib fractures from severe coughing. 2. Tdap vaccine Tdap is only for children 7 years and older, adolescents, and adults.  Adolescents should receive a single dose of Tdap, preferably at age 77 or 43 years. Pregnant women should get a dose of Tdap during every pregnancy, to protect the newborn from pertussis. Infants are most at risk for severe, life-threatening complications from pertussis. Adults who have never received Tdap should get a dose of Tdap. Also, adults should receive a booster dose every 10 years, or earlier in the case of a severe and dirty wound or burn. Booster doses can be either Tdap or Td (a different  vaccine that protects against tetanus and diphtheria but not pertussis). Tdap may be given at the same time as other vaccines. 3. Talk with your health care provider Tell your vaccine provider if the person getting the vaccine:  Has had an allergic reaction after a previous dose of any vaccine that protects against tetanus, diphtheria, or pertussis, or has any severe, life-threatening allergies.  Has had a coma, decreased level of consciousness, or prolonged seizures within 7 days after a previous dose of any pertussis vaccine (DTP, DTaP, or Tdap).  Has seizures or another nervous system problem.  Has ever had Guillain-Barr Syndrome (also called GBS).  Has had severe pain or swelling after a previous dose of any vaccine that protects against tetanus or diphtheria. In some cases, your health care provider may decide to postpone Tdap vaccination to a future visit.  People with minor illnesses, such as a cold, may be vaccinated. People who are moderately or severely ill should usually wait until they recover before getting Tdap vaccine.  Your health care provider can give you more information. 4. Risks of a vaccine reaction  Pain, redness, or swelling where the shot was given, mild fever, headache, feeling tired, and nausea, vomiting, diarrhea, or stomachache sometimes happen after Tdap vaccine. People sometimes faint after medical  procedures, including vaccination. Tell your provider if you feel dizzy or have vision changes or ringing in the ears.  As with any medicine, there is a very remote chance of a vaccine causing a severe allergic reaction, other serious injury, or death. 5. What if there is a serious problem? An allergic reaction could occur after the vaccinated person leaves the clinic. If you see signs of a severe allergic reaction (hives, swelling of the face and throat, difficulty breathing, a fast heartbeat, dizziness, or weakness), call 9-1-1 and get the person to the nearest  hospital. For other signs that concern you, call your health care provider.  Adverse reactions should be reported to the Vaccine Adverse Event Reporting System (VAERS). Your health care provider will usually file this report, or you can do it yourself. Visit the VAERS website at www.vaers.SamedayNews.es or call 917-870-6683. VAERS is only for reporting reactions, and VAERS staff do not give medical advice. 6. The National Vaccine Injury Compensation Program The [employer] Vaccine Injury Compensation Program (VICP) is a federal program that was created to compensate people who may have been injured by certain vaccines. Visit the VICP website at GoldCloset.com.ee or call 808-292-1881 to learn about the program and about filing a claim. There is a time limit to file a claim for compensation. 7. How can I learn more?  Ask your health care provider.  Call your local or state health department.  Contact the Centers for Disease Control and Prevention (CDC): ? Call 305-052-0575 (1-800-CDC-INFO) or ? Visit CDC's website at http://hunter.com/ Vaccine Information Statement Tdap (Tetanus, Diphtheria, Pertussis) Vaccine (02/24/2019) This information is not intended to replace advice given to you by your health care provider. Make sure you discuss any questions you have with your health care provider. Document Revised: 03/05/2019 Document Reviewed: 03/08/2019 Elsevier Patient Education  [place].

## 2020-06-22 NOTE — Progress Notes (Signed)
Assessment and Plan: Melissa Adams was seen today for hospitalization follow-up.  Diagnoses and all orders for this visit:  Need for Tdap vaccination Tdap is recommended every 10 years -     Tdap vaccine greater than or equal to 74yo IM  Prediabetes -     HgB A1c 5.2   Hospital discharge follow-up Presented to the emergency room right pleural effusion  hospital with complain of productive cough, orthopnea, exertional SOB and bilateral pitting edema of the lower extremity's. CT chest shows moderate right pleural effusion with compressive atelectasis. 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 This was discussed in the hospital.  Acute on chronic systolic heart failure (HCC) Scheduled to establish care with the heart failure clinic with Dr. Mariann Laster on July 19, 2020 at 830  Acute respiratory failure with hypoxia [facility]) Split night sleep study at the sleep study center scheduled for June 25, 2020 with Dr. Golden Hurter     HPI  74 y.o.female presents for follow up from the hospital. Admit date to the hospital was 06/10/20, patient was discharged from the hospital on 06/14/20, patient was admitted for: Plural Effusion, essential hypertension, acute on chronic systolic heart failure, end-stage renal disease on dialysis and aortic atherosclerosis.She endorses shortness of breath,  chest pain mid sternum area with radiating to left side stays there and eases off usually takes and hour to resolve andlower extremity edema.   Past Medical History:  Diagnosis Date  . A-fib ([place])   . Allergies   . Arthritis   . Cardiomyopathy ([place])   . CHF (congestive heart failure) ([place])   . Chronic kidney disease   . Edema   . History of hemodialysis   . Hypertension   . Pulmonary hypertension ([place])   . Sleep apnea   . Tricuspid  regurgitation      Allergies  Allergen Reactions  . Black Walnut Flavor Anaphylaxis  . Shellfish Allergy Itching    Makes throat itch       Current Outpatient Medications on File Prior to Visit  Medication Sig Dispense Refill  . acetaminophen (TYLENOL) 325 MG tablet Take 2 tablets (650 mg total) by mouth every 4 (four) hours as needed for headache or mild pain.    Marland Kitchen albuterol (VENTOLIN HFA) 108 (90 Base) MCG/ACT inhaler Inhale 2 puffs into the lungs every 6 (six) hours as needed for wheezing or shortness of breath. 6.7 g 1  . allopurinol (ZYLOPRIM) 300 MG tablet Take 1 tablet (300 mg total) by mouth at bedtime. 90 tablet 1  . apixaban (ELIQUIS) 5 MG TABS tablet Take 1 tablet (5 mg total) by mouth 2 (two) times daily. 60 tablet 11  . atorvastatin (LIPITOR) 40 MG tablet Take 1 tablet (40 mg total) by mouth at bedtime. 90 tablet 1  . B Complex-C-Folic Acid (DIALYVITE 831) 0.8 MG TABS Take 1 tablet by mouth daily.    . cetirizine (ZYRTEC) 10 MG tablet Take 10 mg by mouth daily as needed for allergies.    . cinacalcet (SENSIPAR) 30 MG tablet Take 30 mg by mouth daily with supper.     . ferric citrate (AURYXIA) 1 GM 210 MG(Fe) tablet Take 210 mg by mouth 3 (three) times daily with meals.    . furosemide (LASIX) 80 MG tablet Take  80 mg by mouth See admin instructions. Take 1 tablet (80 mg) by mouth twice daily on non-dialysis days - Sunday, Tuesday, Thursday, Saturday    . lidocaine-prilocaine (EMLA) cream Apply 1 application topically See admin instructions. Apply topically to port access one hour prior to dialysis - Monday, Wednesday, Friday    . metoprolol succinate (TOPROL-XL) 25 MG 24 hr tablet Take 1.5 tablets (37.5 mg total) by mouth 2 (two) times daily. (Patient taking differently: Take 37.5 mg by mouth in the morning and at bedtime. ) 90 tablet 5  . midodrine (PROAMATINE) 10 MG tablet Take 10 mg by mouth See admin instructions. Take one tablet (10 mg) by mouth on Monday, Wednesday, Friday  prior to dialysis    . montelukast (SINGULAIR) 10 MG tablet Take 1 tablet (10 mg total) by mouth daily. (Patient taking differently: Take 10 mg by mouth at bedtime. ) 90 tablet 1  . vitamin C (VITAMIN C) 500 MG tablet Take 1 tablet (500 mg total) by mouth daily. 30 tablet 0   No current facility-administered medications on file prior to visit.    ROS: all negative except above.   Physical Exam: Filed Weights   06/22/20 0949  Weight: (!) 222 lb (100.7 kg)   BP 97/65 (BP Location: Right Arm, Patient Position: Sitting, Cuff Size: Normal)   Pulse 48   Temp 97.8 F (36.6 C) (Oral)   Ht _0  (1.6 m)   Wt (!) 222 lb (100.7 kg)   SpO2 98%   BMI 39.33 kg/m  General Appearance: Well nourished, in no apparent distress. Eyes: PERRLA, EOMs, conjunctiva no swelling or erythema Sinuses: No Frontal/maxillary tenderness ENT/Mouth: Ext aud canals clear, TMs without erythema, bulging. No erythema, swelling, or exudate on post pharynx.  Tonsils not swollen or erythematous. Hearing normal.  Neck: Supple, thyroid normal.  Respiratory: Respiratory effort normal, BS equal bilaterally without rales, rhonchi, wheezing or stridor.  Cardio: RRR with no MRGs. Brisk peripheral pulses bilateral lower extremity edema-compression hoses needed to be worn   Abdomen: Soft, + BS.  Non tender, no guarding, rebound, hernias, masses. Lymphatics: Non tender without lymphadenopathy.  Musculoskeletal: Full ROM, 5/5 strength, unstable gait uses a cane. Skin: Warm, dry without rashes, lesions, ecchymosis.  Neuro: Cranial nerves intact. Normal muscle tone, no cerebellar symptoms. Sensation intact.  Psych: Awake and oriented X 3, normal affect, Insight and Judgment appropriate.     [name], NP 10:28 AM

## 2020-06-23 LAB — CMP14+EGFR
ALT: 35 IU/L — ABNORMAL HIGH (ref 0–32)
AST: 42 IU/L — ABNORMAL HIGH (ref 0–40)
Albumin/Globulin Ratio: 1.8 (ref 1.2–2.2)
Albumin: 4.2 g/dL (ref 3.7–4.7)
Alkaline Phosphatase: 308 IU/L — ABNORMAL HIGH (ref 48–121)
BUN/Creatinine Ratio: 5 — ABNORMAL LOW (ref 12–28)
BUN: 28 mg/dL — ABNORMAL HIGH (ref 8–27)
Bilirubin Total: 1.4 mg/dL — ABNORMAL HIGH (ref 0.0–1.2)
CO2: 31 mmol/L — ABNORMAL HIGH (ref 20–29)
Calcium: 9.1 mg/dL (ref 8.7–10.3)
Chloride: 94 mmol/L — ABNORMAL LOW (ref 96–106)
Creatinine, Ser: 5.16 mg/dL — ABNORMAL HIGH (ref 0.57–1.00)
GFR calc Af Amer: 9 mL/min/{1.73_m2} — ABNORMAL LOW (ref >59)
GFR calc non Af Amer: 8 mL/min/{1.73_m2} — ABNORMAL LOW (ref >59)
Globulin, Total: 2.4 g/dL (ref 1.5–4.5)
Glucose: 85 mg/dL (ref 65–99)
Potassium: 4.6 mmol/L (ref 3.5–5.2)
Sodium: 141 mmol/L (ref 134–144)
Total Protein: 6.6 g/dL (ref 6.0–8.5)

## 2020-06-23 LAB — CBC WITH DIFFERENTIAL/PLATELET
Basophils Absolute: 0 10*3/uL (ref 0.0–0.2)
Basos: 0 %
EOS (ABSOLUTE): 0 10*3/uL (ref 0.0–0.4)
Eos: 0 %
Hematocrit: 35.1 % (ref 34.0–46.6)
Hemoglobin: 11.4 g/dL (ref 11.1–15.9)
Immature Grans (Abs): 0 10*3/uL (ref 0.0–0.1)
Immature Granulocytes: 0 %
Lymphocytes Absolute: 1.4 10*3/uL (ref 0.7–3.1)
Lymphs: 23 %
MCH: 31.3 pg (ref 26.6–33.0)
MCHC: 32.5 g/dL (ref 31.5–35.7)
MCV: 96 fL (ref 79–97)
Monocytes Absolute: 1 10*3/uL — ABNORMAL HIGH (ref 0.1–0.9)
Monocytes: 15 %
Neutrophils Absolute: 3.8 10*3/uL (ref 1.4–7.0)
Neutrophils: 62 %
Platelets: 141 10*3/uL — ABNORMAL LOW (ref 150–450)
RBC: 3.64 x10E6/uL — ABNORMAL LOW (ref 3.77–5.28)
RDW: 14.9 % (ref 11.7–15.4)
WBC: 6.2 10*3/uL (ref 3.4–10.8)

## 2020-06-23 NOTE — Addendum Note (Signed)
Addended by: Freada Bergeron on: 06/23/2020 06:49 PM   Modules accepted: Orders

## 2020-06-24 ENCOUNTER — Ambulatory Visit (HOSPITAL_BASED_OUTPATIENT_CLINIC_OR_DEPARTMENT_OTHER): Payer: Medicare HMO | Attending: Cardiology | Admitting: Cardiology

## 2020-06-25 ENCOUNTER — Encounter (HOSPITAL_BASED_OUTPATIENT_CLINIC_OR_DEPARTMENT_OTHER): Payer: Medicare HMO | Admitting: Cardiology

## 2020-06-27 NOTE — Procedures (Signed)
Erroneous encounter

## 2020-06-27 NOTE — Progress Notes (Signed)
This encounter was created in error - please disregard.

## 2020-06-29 ENCOUNTER — Telehealth: Payer: Self-pay

## 2020-06-29 NOTE — Telephone Encounter (Signed)
Pls contact Mariann Laster [phone] ext [phone]

## 2020-06-29 NOTE — Telephone Encounter (Signed)
Returned call to Melissa Adams at [employer]. States she faxed a CMN for home oxygen on 06/26/2020. Have not received this. Patient was d/c 06/14/2020 on home oxygen while on our service. Melissa Adams will refax today. Will ask Attending to sign. Hubbard Hartshorn, BSN, RN-BC

## 2020-06-30 NOTE — Telephone Encounter (Signed)
Has this documentation come in?  I have not seen it.  Thanks!

## 2020-07-01 ENCOUNTER — Emergency Department (HOSPITAL_COMMUNITY)
Admission: EM | Admit: 2020-07-01 | Discharge: 2020-07-01 | Disposition: A | Discharge status: Home / Self Care | Payer: Medicare HMO | Attending: Emergency Medicine | Admitting: Emergency Medicine

## 2020-07-01 ENCOUNTER — Encounter (HOSPITAL_COMMUNITY): Payer: Self-pay

## 2020-07-01 ENCOUNTER — Other Ambulatory Visit: Payer: Self-pay

## 2020-07-01 DIAGNOSIS — I5023 Acute on chronic systolic (congestive) heart failure: Secondary | ICD-10-CM | POA: Diagnosis not present

## 2020-07-01 DIAGNOSIS — N186 End stage renal disease: Secondary | ICD-10-CM | POA: Diagnosis not present

## 2020-07-01 DIAGNOSIS — I132 Hypertensive heart and chronic kidney disease with heart failure and with stage 5 chronic kidney disease, or end stage renal disease: Secondary | ICD-10-CM | POA: Insufficient documentation

## 2020-07-01 DIAGNOSIS — Z992 Dependence on renal dialysis: Secondary | ICD-10-CM | POA: Insufficient documentation

## 2020-07-01 DIAGNOSIS — Z87891 Personal history of nicotine dependence: Secondary | ICD-10-CM | POA: Diagnosis not present

## 2020-07-01 LAB — BASIC METABOLIC PANEL
Anion gap: 15 (ref 5–15)
BUN: 32 mg/dL — ABNORMAL HIGH (ref 8–23)
CO2: 28 mmol/L (ref 22–32)
Calcium: 9.3 mg/dL (ref 8.9–10.3)
Chloride: 95 mmol/L — ABNORMAL LOW (ref 98–111)
Creatinine, Ser: 5.32 mg/dL — ABNORMAL HIGH (ref 0.44–1.00)
GFR calc Af Amer: 9 mL/min — ABNORMAL LOW (ref >60)
GFR calc non Af Amer: 7 mL/min — ABNORMAL LOW (ref >60)
Glucose, Bld: 89 mg/dL (ref 70–99)
Potassium: 4.4 mmol/L (ref 3.5–5.1)
Sodium: 138 mmol/L (ref 135–145)

## 2020-07-01 LAB — CBC
HCT: 40.2 % (ref 36.0–46.0)
Hemoglobin: 11.8 g/dL — ABNORMAL LOW (ref 12.0–15.0)
MCH: 30.8 pg (ref 26.0–34.0)
MCHC: 29.4 g/dL — ABNORMAL LOW (ref 30.0–36.0)
MCV: 105 fL — ABNORMAL HIGH (ref 80.0–100.0)
Platelets: 159 10*3/uL (ref 150–400)
RBC: 3.83 MIL/uL — ABNORMAL LOW (ref 3.87–5.11)
RDW: 17.3 % — ABNORMAL HIGH (ref 11.5–15.5)
WBC: 5 10*3/uL (ref 4.0–10.5)
nRBC: 0 % (ref 0.0–0.2)

## 2020-07-01 NOTE — ED Provider Notes (Signed)
[facility] EMERGENCY DEPARTMENT Provider Note   CSN: [id number] Arrival date & time: 07/01/20  1311     History Chief Complaint  Patient presents with  . Vascular Access Problem    Melissa Adams is a 74 y.o. female.  HPI   Patient presents to the ED via EMS for concern over fistula bleeding.  Patient reports she commonly has issues with her distal AV fistula site bleeding.  She denies any dizziness, near syncope or other concerning symptoms.  Patient reports she had a full dialysis session earlier today.  They applied a tourniquet at the dialysis center due to continued bleeding and sent her to the ED for evaluation.  No additional treatment attempted prior to arrival.     Past Medical History:  Diagnosis Date  . A-fib ([place])   . Allergies   . Arthritis   . Cardiomyopathy ([place])   . CHF (congestive heart failure) ([place])   . Chronic kidney disease   . Edema   . History of hemodialysis   . Hypertension   . Pulmonary hypertension ([place])   . Sleep apnea   . Tricuspid regurgitation     Patient Active Problem List   Diagnosis Date Noted  . Pleural effusion 06/11/2020  . Aortic atherosclerosis ([place]) 06/11/2020  . ESRD on dialysis ([place]) 01/11/2020  . Acute on chronic systolic heart failure ([place])   . Pressure injury of skin 10/22/2019  . Dyspnea   . COVID-19 virus infection   . CHF exacerbation ([place]) 10/12/2019  . Acute CHF (congestive heart failure) ([place]) 10/11/2019  . Atrial fibrillation, chronic ([place]) 10/11/2019  . Essential hypertension 10/11/2019  . Sleep apnea 10/11/2019  . AKI (acute kidney injury) ([place]) 10/11/2019  . Gout 10/11/2019  . Normocytic anemia 10/11/2019    Past Surgical History:  Procedure Laterality Date  . A/V FISTULAGRAM Left 05/23/2020   Procedure: A/V FISTULAGRAM;  Surgeon: [name], MD;  Location: [facility];  Service: Cardiovascular;  Laterality: Left;  . ABDOMINAL HYSTERECTOMY    . AV FISTULA PLACEMENT  Left 11/04/2019   Procedure: Insertion Of Arteriovenous (Av) Gore-Tex Graft Arm;  Surgeon: [name], MD;  Location: [facility];  Service: Vascular;  Laterality: Left;  . CARDIAC CATHETERIZATION    . CARDIOVERSION N/A 10/26/2019   Procedure: CARDIOVERSION;  Surgeon: [name], MD;  Location[facility] ENDOSCOPY;  Service: Cardiovascular;  Laterality: N/A;  . COLONOSCOPY W/ BIOPSIES AND POLYPECTOMY    . FISTULA SUPERFICIALIZATION Left 01/21/2020   Procedure: FISTULA SUPERFICIALIZATION OF FISTULA;  Surgeon: [name], MD;  Location: [facility];  Service: Vascular;  Laterality: Left;  . INSERTION OF DIALYSIS CATHETER Right 11/04/2019   Procedure: CONVERT TEMPORARY DIALYSIS CATHETER TO TUNNELED DIALYSIS CATHETER Right Internal Jugular.;  Surgeon: [name], MD;  Location: [facility];  Service: Vascular;  Laterality: Right;  . MULTIPLE TOOTH EXTRACTIONS    . REVISION OF ARTERIOVENOUS GORETEX GRAFT Left 01/21/2020   Procedure: REVISION OF ARTERIOVENOUS FISTULA WITH SIDE BRANCH LIGATION;  Surgeon: [name], MD;  Location: [facility];  Service: Vascular;  Laterality: Left;  . TEE WITHOUT CARDIOVERSION N/A 10/26/2019   Procedure: TRANSESOPHAGEAL ECHOCARDIOGRAM (TEE);  Surgeon: [name], MD;  Location[facility] END[facility]COPY;  Service: Cardiovascular;  Laterality: N/A;  . THORACENTESIS       OB History   No obstetric history on file.     Family History  Problem Relation Age of Onset  . Seizures Father   .  CAD Father   . Diabetes Sister   . Lupus Sister   . Hypertension Sister     Social History   Tobacco Use  . Smoking status: Former Smoker    Types: Cigarettes  . Smokeless tobacco: Never Used  Vaping Use  . Vaping Use: Never used  Substance Use Topics  . Alcohol use: Not Currently  . Drug use: Not Currently    Home Medications Prior to Admission medications   Medication Sig Start Date End Date Taking? Authorizing Provider  acetaminophen (TYLENOL) 325 MG  tablet Take 2 tablets (650 mg total) by mouth every 4 (four) hours as needed for headache or mild pain. 11/05/19  Yes Buriev, Arie Sabina, MD  albuterol (VENTOLIN HFA) 108 (90 Base) MCG/ACT inhaler Inhale 2 puffs into the lungs every 6 (six) hours as needed for wheezing or shortness of breath. 11/30/19  Yes [name], NP  allopurinol (ZYLOPRIM) 300 MG tablet Take 1 tablet (300 mg total) by mouth at bedtime. 06/14/20  Yes [name], NP  apixaban (ELIQUIS) 5 MG TABS tablet Take 1 tablet (5 mg total) by mouth 2 (two) times daily. 12/15/19  Yes [name], MD  atorvastatin (LIPITOR) 40 MG tablet Take 1 tablet (40 mg total) by mouth at bedtime. 06/14/20  Yes [name], NP  cetirizine (ZYRTEC) 10 MG tablet Take 10 mg by mouth daily as needed for allergies.   Yes [provider]  cinacalcet (SENSIPAR) 30 MG tablet Take 30 mg by mouth daily with supper.  04/18/20  Yes [provider]  ferric citrate (AURYXIA) 1 GM 210 MG(Fe) tablet Take 210 mg by mouth 3 (three) times daily with meals.   Yes [provider]  furosemide (LASIX) 40 MG tablet Take 80 mg by mouth See admin instructions. Take 2 tablets (80 mg) by mouth twice daily on non-dialysis days - Sunday, Tuesday, Thursday   Yes [provider]  lidocaine-prilocaine (EMLA) cream Apply 1 application topically See admin instructions. Apply topically to port access one hour prior to dialysis - Monday, Wednesday, Friday, Saturday 04/07/20  Yes [provider]  metoprolol succinate (TOPROL-XL) 25 MG 24 hr tablet Take 1.5 tablets (37.5 mg total) by mouth 2 (two) times daily. Patient taking differently: Take 37.5 mg by mouth in the morning and at bedtime.  12/23/19  Yes [name], MD  midodrine (PROAMATINE) 10 MG tablet Take 10 mg by mouth See admin instructions. Take one tablet (10 mg) by mouth on Monday, Wednesday, Friday, Saturday prior to dialysis 02/10/20  Yes [provider]    montelukast (SINGULAIR) 10 MG tablet Take 1 tablet (10 mg total) by mouth daily. Patient taking differently: Take 10 mg by mouth at bedtime.  11/30/19  Yes [name], NP  multivitamin (RENA-VIT) TABS tablet Take 1 tablet by mouth daily.   Yes [provider]  vitamin C (VITAMIN C) 500 MG tablet Take 1 tablet (500 mg total) by mouth daily. 11/06/19  Yes Kinnie Feil, MD    Allergies    Black walnut flavor and Shellfish allergy  Review of Systems   Review of Systems  Constitutional: Negative for chills and fever.  HENT: Negative for ear pain and sore throat.   Eyes: Negative for pain and visual disturbance.  Respiratory: Negative for cough and shortness of breath.   Cardiovascular: Negative for chest pain and palpitations.  Gastrointestinal: Negative for abdominal pain and vomiting.  Genitourinary: Negative for dysuria and hematuria.  Musculoskeletal: Negative for arthralgias and back pain.  Skin: Positive for wound. Negative for color change and rash.  Neurological: Negative for seizures and syncope.  All other systems reviewed and are negative.   Physical Exam Updated Vital Signs BP 101/76   Pulse (!) 49   Temp 97.6 F (36.4 C) (Oral)   Resp 14   Ht 5\' 3"  (1.6 m)   SpO2 100%   BMI 39.33 kg/m   Physical Exam Vitals and nursing note reviewed.  Constitutional:      General: She is not in acute distress.    Appearance: Normal appearance. She is well-developed and normal weight. She is not ill-appearing or toxic-appearing.  HENT:     Head: Normocephalic and atraumatic.     Right Ear: Tympanic membrane normal.     Left Ear: Tympanic membrane normal.  Eyes:     Extraocular Movements: Extraocular movements intact.     Conjunctiva/sclera: Conjunctivae normal.     Pupils: Pupils are equal, round, and reactive to light.  Cardiovascular:     Rate and Rhythm: Normal rate and regular rhythm.     Heart sounds: No murmur heard.   Pulmonary:     Effort:  Pulmonary effort is normal. No respiratory distress.     Breath sounds: Normal breath sounds.  Abdominal:     Palpations: Abdomen is soft.     Tenderness: There is no abdominal tenderness.  Musculoskeletal:     Cervical back: Neck supple.  Skin:    General: Skin is warm and dry.     Capillary Refill: Capillary refill takes less than 2 seconds.     Findings: Wound present.       Neurological:     General: No focal deficit present.     Mental Status: She is alert and oriented to person, place, and time. Mental status is at baseline.  Psychiatric:        Mood and Affect: Mood normal.        Behavior: Behavior normal.     ED Results / Procedures / Treatments   Labs (all labs ordered are listed, but only abnormal results are displayed) Labs Reviewed  CBC - Abnormal; Notable for the following components:      Result Value   RBC 3.83 (*)    Hemoglobin 11.8 (*)    MCV 105.0 (*)    MCHC 29.4 (*)    RDW 17.3 (*)    All other components within normal limits  BASIC METABOLIC PANEL - Abnormal; Notable for the following components:   Chloride 95 (*)    BUN 32 (*)    Creatinine, Ser 5.32 (*)    GFR calc non Af Amer 7 (*)    GFR calc Af Amer 9 (*)    All other components within normal limits    EKG None  Radiology No results found.  Procedures Procedures (including critical care time)  Medications Ordered in ED Medications - No data to display  ED Course   Melissa Adams is a 74 y.o. female with PMHx listed that presents to the Emergency Department complaint of Vascular Access Problem   ED Course: Initial exam completed.   Well-appearing and hemodynamically stable.  Nontoxic and afebrile.  Physical exam significant for age-appropriate 74 year old female with small punctate bleeding from the left AV fistula site, good distal perfusion, and bleeding after clamp was removed.   Initial wound was hemostatic however shortly after the dialysis wound clamp was removed, small  bleeding did  resume.  Quick clot bandage was placed on the wound with pressure dressing applied via coband.  Patient remained neurovascular intact distally.  Intermittent hypotension which I suspect not related to hemorrhage however will check basic labs; patient otherwise asymptomatic.  CBC with mild anemia 11.8 but no significant change from previous baseline and no evidence of leukocytosis or leukopenia.  BMP with no acute electrolyte abnormalities requiring urgent intervention and does show expected changes in ESRD/CKD.  Patient remained hemostatic with no bleeding through the dressing; dressing left in place and told to follow-up for recheck at dialysis on Monday.  Leave bandage in place until then.  Return to ED for any worsening bleeding or other concerning symptoms.   Diagnostics Vital Signs: reviewed Labs: reviewed and significant findings discussed above Records: nursing notes along with previous records reviewed and pertinent data discussed   Consults:  none   Reevaluation/Disposition:  Upon reevaluation, patients symptoms stable/improved. No active nausea/vomiting and ambulatory without assistance prior to discharge from the emergency department.    All questions answered.  Strict return precautions were discussed. Additionally we discussed establishing and/or following-up with primary care physician.  Patient and/or family was understanding and in agreement with today's assessment and plan.   Sherolyn Buba, MD Emergency Medicine, PGY-3   Note: Dragon medical dictation software was used in the creation of this note.  Final Clinical Impression(s) / ED Diagnoses Final diagnoses:  ESRD (end stage renal disease) [facility])    Rx / DC Orders ED Discharge Orders    None       Frann Rider, MD 07/01/20 Remus Loffler    Elnora Morrison, MD 07/02/20 0001

## 2020-07-01 NOTE — ED Notes (Signed)
MD Gross aware of B/P systolic in 99'O

## 2020-07-01 NOTE — ED Notes (Signed)
PT able to ambulate hallway with ease. Denies ShOB, weakness.

## 2020-07-01 NOTE — ED Notes (Signed)
Patient verbalizes understanding of discharge instructions. Opportunity for questioning and answers were provided. Armband removed by staff, pt discharged from ED via wheelchair to go home with daughter.

## 2020-07-01 NOTE — ED Triage Notes (Signed)
Pt BIB by [facility] from dialysis center, after finishing her treatment they were unable to get the bleeding under control to fistula site. Pt presents with a tourniquet in place, bleeding controlled at this time, pt states this happens often after dialysis.    BP 112/50 HR 70 RR 16 94% RA

## 2020-07-01 NOTE — Discharge Instructions (Addendum)
Please follow-up with your nephrology team and vascular team.  Leave bandage in place until you can be evaluated; they should be able to evaluate at your next dialysis session.  Return to ED for any worsening or concerning symptoms or re-bleeding.

## 2020-07-05 ENCOUNTER — Other Ambulatory Visit (HOSPITAL_COMMUNITY): Payer: Self-pay | Admitting: Cardiology

## 2020-07-19 ENCOUNTER — Ambulatory Visit: Payer: Medicare HMO | Admitting: Nurse Practitioner

## 2020-07-26 ENCOUNTER — Ambulatory Visit
Admission: RE | Admit: 2020-07-26 | Discharge: 2020-07-26 | Disposition: A | Discharge status: Home / Self Care | Payer: Medicare HMO | Source: Ambulatory Visit | Attending: Nephrology | Admitting: Nephrology

## 2020-07-26 ENCOUNTER — Other Ambulatory Visit: Payer: Self-pay | Admitting: Nephrology

## 2020-07-26 DIAGNOSIS — J90 Pleural effusion, not elsewhere classified: Secondary | ICD-10-CM

## 2020-07-27 ENCOUNTER — Ambulatory Visit: Payer: Medicare HMO | Admitting: Nurse Practitioner

## 2020-08-05 ENCOUNTER — Other Ambulatory Visit (INDEPENDENT_AMBULATORY_CARE_PROVIDER_SITE_OTHER): Payer: Self-pay | Admitting: Primary Care

## 2020-08-05 NOTE — Telephone Encounter (Signed)
Requested Prescriptions  Pending Prescriptions Disp Refills   montelukast (SINGULAIR) 10 MG tablet [Pharmacy Med Name: Montelukast Sodium 10 MG Oral Tablet] 90 tablet 0    Sig: Take 1 tablet by mouth once daily     Pulmonology:  Leukotriene Inhibitors Passed - 08/05/2020 11:41 AM      Passed - Valid encounter within last 12 months    Recent Outpatient Visits          1 month ago Need for Tdap vaccination   [facility] [name], NP   4 months ago Need for Tdap vaccination   [place], Michelle P, NP   8 months ago Encounter to establish care   [facility] [name], NP

## 2020-08-09 ENCOUNTER — Other Ambulatory Visit: Payer: Self-pay | Admitting: Nurse Practitioner

## 2020-08-09 ENCOUNTER — Other Ambulatory Visit (HOSPITAL_COMMUNITY): Payer: Self-pay | Admitting: Nurse Practitioner

## 2020-08-09 DIAGNOSIS — J90 Pleural effusion, not elsewhere classified: Secondary | ICD-10-CM

## 2020-08-13 ENCOUNTER — Other Ambulatory Visit (INDEPENDENT_AMBULATORY_CARE_PROVIDER_SITE_OTHER): Payer: Self-pay | Admitting: Primary Care

## 2020-08-15 ENCOUNTER — Encounter (HOSPITAL_COMMUNITY): Payer: Medicare HMO | Admitting: Cardiology

## 2020-08-15 ENCOUNTER — Ambulatory Visit (HOSPITAL_COMMUNITY)
Admission: RE | Admit: 2020-08-15 | Discharge: 2020-08-15 | Disposition: A | Discharge status: Home / Self Care | Payer: Medicare HMO | Source: Ambulatory Visit | Attending: Cardiology | Admitting: Cardiology

## 2020-08-15 ENCOUNTER — Other Ambulatory Visit (HOSPITAL_COMMUNITY)
Admission: RE | Admit: 2020-08-15 | Discharge: 2020-08-15 | Disposition: A | Discharge status: Home / Self Care | Payer: Medicare HMO | Source: Ambulatory Visit | Attending: Cardiology | Admitting: Cardiology

## 2020-08-15 ENCOUNTER — Other Ambulatory Visit: Payer: Self-pay

## 2020-08-15 ENCOUNTER — Encounter (HOSPITAL_COMMUNITY): Payer: Self-pay | Admitting: Cardiology

## 2020-08-15 VITALS — BP 102/64 | HR 66 | Wt 213.8 lb

## 2020-08-15 DIAGNOSIS — Z79899 Other long term (current) drug therapy: Secondary | ICD-10-CM | POA: Diagnosis not present

## 2020-08-15 DIAGNOSIS — I5082 Biventricular heart failure: Secondary | ICD-10-CM | POA: Insufficient documentation

## 2020-08-15 DIAGNOSIS — K7460 Unspecified cirrhosis of liver: Secondary | ICD-10-CM | POA: Insufficient documentation

## 2020-08-15 DIAGNOSIS — Z87891 Personal history of nicotine dependence: Secondary | ICD-10-CM | POA: Diagnosis not present

## 2020-08-15 DIAGNOSIS — M109 Gout, unspecified: Secondary | ICD-10-CM | POA: Diagnosis not present

## 2020-08-15 DIAGNOSIS — I132 Hypertensive heart and chronic kidney disease with heart failure and with stage 5 chronic kidney disease, or end stage renal disease: Secondary | ICD-10-CM | POA: Insufficient documentation

## 2020-08-15 DIAGNOSIS — I428 Other cardiomyopathies: Secondary | ICD-10-CM | POA: Diagnosis not present

## 2020-08-15 DIAGNOSIS — Z7901 Long term (current) use of anticoagulants: Secondary | ICD-10-CM | POA: Diagnosis not present

## 2020-08-15 DIAGNOSIS — Z8616 Personal history of COVID-19: Secondary | ICD-10-CM | POA: Insufficient documentation

## 2020-08-15 DIAGNOSIS — N186 End stage renal disease: Secondary | ICD-10-CM

## 2020-08-15 DIAGNOSIS — I5022 Chronic systolic (congestive) heart failure: Secondary | ICD-10-CM | POA: Diagnosis not present

## 2020-08-15 DIAGNOSIS — Z01818 Encounter for other preprocedural examination: Secondary | ICD-10-CM | POA: Diagnosis present

## 2020-08-15 DIAGNOSIS — G4733 Obstructive sleep apnea (adult) (pediatric): Secondary | ICD-10-CM | POA: Insufficient documentation

## 2020-08-15 DIAGNOSIS — I5023 Acute on chronic systolic (congestive) heart failure: Secondary | ICD-10-CM | POA: Diagnosis not present

## 2020-08-15 DIAGNOSIS — I482 Chronic atrial fibrillation, unspecified: Secondary | ICD-10-CM | POA: Insufficient documentation

## 2020-08-15 DIAGNOSIS — I340 Nonrheumatic mitral (valve) insufficiency: Secondary | ICD-10-CM

## 2020-08-15 DIAGNOSIS — Z992 Dependence on renal dialysis: Secondary | ICD-10-CM | POA: Diagnosis not present

## 2020-08-15 DIAGNOSIS — Z20822 Contact with and (suspected) exposure to covid-19: Secondary | ICD-10-CM | POA: Insufficient documentation

## 2020-08-15 DIAGNOSIS — E785 Hyperlipidemia, unspecified: Secondary | ICD-10-CM | POA: Diagnosis not present

## 2020-08-15 LAB — LIPID PANEL
Cholesterol: 171 mg/dL (ref 0–200)
HDL: 53 mg/dL (ref >40)
LDL Cholesterol: 107 mg/dL — ABNORMAL HIGH (ref 0–99)
Total CHOL/HDL Ratio: 3.2 RATIO
Triglycerides: 53 mg/dL (ref <150)
VLDL: 11 mg/dL (ref 0–40)

## 2020-08-15 LAB — SARS CORONAVIRUS 2 (TAT 6-24 HRS): SARS Coronavirus 2: NEGATIVE

## 2020-08-15 NOTE — Patient Instructions (Addendum)
Our office will send a message to the sleep study department regarding your CPAP.  If you do not hear back from them. Please give them a call. [phone]  Labs today We will only contact you if something comes back abnormal or we need to make some changes. Otherwise no news is good news!'  Your physician recommends that you schedule a follow-up appointment in: 4 months with Dr Aundra Dubin  Please call office at [phone] option 2 if you have any questions or concerns.   At the [facility], you and your health needs are our priority. As part of our continuing mission to provide you with exceptional heart care, we have created designated Provider Care Teams. These Care Teams include your primary Cardiologist (physician) and Advanced Practice Providers (APPs- Physician Assistants and Nurse Practitioners) who all work together to provide you with the care you need, when you need it.   You may see any of the following providers on your designated Care Team at your next follow up: Marland Kitchen Dr Glori Bickers . Dr [name] . Darrick Grinder, NP . Lyda Jester, PA . Audry Riles, PharmD   Please be sure to bring in all your medications bottles to every appointment.

## 2020-08-15 NOTE — Progress Notes (Signed)
Cardiology: Dr. Aundra Dubin  Nephrology: Dr. Moshe Cipro  74 y.o. with history of chronic systolic HF, mitral valve regurgitation and tricuspid valve regurgitation was scheduled to undergo both mitral and tricuspid valve replacement surgery on 10/13/19 in [place], [place].  He had a prior cath in 9/20 with no significant coronary disease.  Also w/ h/o atrial fibrillation on chronic anticoagulation w/ Eliquis, OSA on CPAP, and HTN.  Echocardiogram on August 05, 2019 in [place] noted an EF of 40 to 45% with severe MR and TR.  She presented to the [facility] 10/11/19 with progressive SOB and wt gain and edema over the prior 2-3 weeks.  She had been staying with her daughter in [place].  She was found in the ER to have markedly elevated BNP and marked volume overload. Hs troponin mildly elevated w/ flat trend 30>>33>>27>>31. SCr 1.91 on admit. BUN 47. K 4.1. COVID-19 +.  D-dimer 12.41. started on IV heparin. V/Q scan showed no evidence of PE. She was in atrial fibrillation with RVR.  Echo was done, showing EF down to 20-25%. She was started on milrinone and diuresed. Despite this, creatinine steadily rose. She ultimately required CVVH.  Significant volume was removed.  Renal function did not recover and she was started on iHD.  TEE-guided DCCV was attempted but she did not stay in NSR.  Amiodarone was stopped as she did not maintain NSR. She was treated with remdesivir and Decadron for COVID-19.   Echo in 5/21 showed EF 30-35%, D-shaped septum with moderately dilated RV/mildly decreased systolic function, PASP 43, moderate MR, moderate TR.   She has had thoracenteses in 6/21, 7/21, and to get a thoracentesis again today (all on the right).   She returns for followup of CHF.  She had been getting HD 4 times a week due to volume overload, now down to 3 times/week.   Sleep study showed moderate OSA but she has not received CPAP yet. She is using oxygen at night.  She is short of breath walking about 50 feet, this is  stable.  +chronic orthopnea.  She gets sharp, right-sided chest pain that resolves in a few seconds.  No particular trigger.    ECG (personally reviewed): Atrial fibrillation, PVCs, LPFB  PMH: 1. Gout 2. Chronic atrial fibrillation: She failed TEE-guided DCCV in 12/20.  Prior DCCVs attempted in [place].  3. OSA 4. COVID-19 infection: 11/20-12/20. Treated with remdesivir + dexamethasone.  5. AKI 11/20 with development of ESRD.  6. HTN 7. Chronic systolic CHF: 7/48 LHC in [place], no significant coronary disease.  - [facility] EF 40-45% with severe MR and TR.   - TEE (12/20): EF 20%, diffuse hypokinesis, moderately decreased RV systolic function, moderate-severe TR, peak RV-RA gradient 46 mmHg, moderate functional MR.  - Echo (5/21): EF 30-35%, D-shaped septum with moderately dilated RV/mildly decreased systolic function, PASP 43, moderate MR, moderate TR.  8. Hyperlipidemia 9. Right pleural effusion: Recurrent, thoracenteses in 6/21, 7/21, 9/21.  10. Cirrhosis: Noted on abdominal US.  Never a heavy drinker, HCV and HBV negative.   Social History   Socioeconomic History  . Marital status: Widowed    Spouse name: Not on file  . Number of children: Not on file  . Years of education: Not on file  . Highest education level: Not on file  Occupational History  . Not on file  Tobacco Use  . Smoking status: Former Smoker    Types: Cigarettes  . Smokeless tobacco: Never Used  Vaping Use  .  Vaping Use: Never used  Substance and Sexual Activity  . Alcohol use: Not Currently  . Drug use: Not Currently  . Sexual activity: Not Currently  Other Topics Concern  . Not on file  Social History Narrative  . Not on file   Social Determinants of Health   Financial Resource Strain:   . Difficulty of Paying Living Expenses: Not on file  Food Insecurity:   . Worried About Charity fundraiser in the Last Year: Not on file  . Ran Out of Food in the Last Year: Not on file   Transportation Needs: No Transportation Needs  . Lack of Transportation (Medical): No  . Lack of Transportation (Non-Medical): No  Physical Activity:   . Days of Exercise per Week: Not on file  . Minutes of Exercise per Session: Not on file  Stress:   . Feeling of Stress : Not on file  Social Connections: Socially Isolated  . Frequency of Communication with Friends and Family: More than three times a week  . Frequency of Social Gatherings with Friends and Family: Once a week  . Attends Religious Services: Never  . Active Member of Clubs or Organizations: No  . Attends Archivist Meetings: Never  . Marital Status: Widowed  Intimate Partner Violence:   . Fear of Current or Ex-Partner: Not on file  . Emotionally Abused: Not on file  . Physically Abused: Not on file  . Sexually Abused: Not on file   Family History  Problem Relation Age of Onset  . Seizures Father   . CAD Father   . Diabetes Sister   . Lupus Sister   . Hypertension Sister    ROS: All systems reviewed and negative except as per HPI.   Current Outpatient Medications  Medication Sig Dispense Refill  . acetaminophen (TYLENOL) 325 MG tablet Take 2 tablets (650 mg total) by mouth every 4 (four) hours as needed for headache or mild pain.    Marland Kitchen albuterol (VENTOLIN HFA) 108 (90 Base) MCG/ACT inhaler Inhale 2 puffs into the lungs every 6 (six) hours as needed for wheezing or shortness of breath. 6.7 g 1  . allopurinol (ZYLOPRIM) 300 MG tablet Take 1 tablet (300 mg total) by mouth at bedtime. 90 tablet 1  . apixaban (ELIQUIS) 5 MG TABS tablet Take 1 tablet (5 mg total) by mouth 2 (two) times daily. 60 tablet 11  . atorvastatin (LIPITOR) 40 MG tablet Take 1 tablet (40 mg total) by mouth at bedtime. 90 tablet 1  . cetirizine (ZYRTEC) 10 MG tablet Take 10 mg by mouth daily as needed for allergies.    . cinacalcet (SENSIPAR) 30 MG tablet Take 30 mg by mouth daily with supper.     . ferric citrate (AURYXIA) 1 GM 210  MG(Fe) tablet Take 210 mg by mouth 3 (three) times daily with meals.    . furosemide (LASIX) 40 MG tablet Take 80 mg by mouth See admin instructions. Take 2 tablets (80 mg) by mouth twice daily on non-dialysis days - Sunday, Tuesday, Thursday    . lidocaine-prilocaine (EMLA) cream Apply 1 application topically See admin instructions. Apply topically to port access one hour prior to dialysis - Monday, Wednesday, Friday, Saturday    . metoprolol succinate (TOPROL-XL) 25 MG 24 hr tablet Take 1.5 tablets (37.5 mg total) by mouth 2 (two) times daily. 270 tablet 3  . midodrine (PROAMATINE) 10 MG tablet Take 10 mg by mouth See admin instructions. Take one tablet (10 mg)  by mouth on Monday, Wednesday, Friday, Saturday prior to dialysis    . montelukast (SINGULAIR) 10 MG tablet Take 1 tablet by mouth once daily 90 tablet 0  . multivitamin (RENA-VIT) TABS tablet Take 1 tablet by mouth daily.    . vitamin C (VITAMIN C) 500 MG tablet Take 1 tablet (500 mg total) by mouth daily. 30 tablet 0   No current facility-administered medications for this encounter.   BP 102/64   Pulse 66   Wt 97 kg (213 lb 12.8 oz)   BMI 37.87 kg/m  General: NAD Neck: JVP 9-10 cm, no thyromegaly or thyroid nodule.  Lungs: Decreased BS right base.  CV: Nondisplaced PMI.  Heart regular S1/S2, no S3/S4, no murmur.  1+ edema to knees.  No carotid bruit.  Normal pedal pulses.  Abdomen: Soft, nontender, no hepatosplenomegaly, no distention.  Skin: Intact without lesions or rashes.  Neurologic: Alert and oriented x 3.  Psych: Normal affect. Extremities: No clubbing or cyanosis.  HEENT: Normal.   Assessment/Plan:  1. Acute on chronic systolic CHF:  Nonischemic cardiomyopathy, cath in [place] in 9/20 with no significant coronary disease.  Echo in 11/20 with EF 20-25% with dilated and severely dysfunctional RV with D-shaped interventricular septum. MR appeared moderate and TR appeared moderate. With dilated RV, V/Q scan was done and was  negative. Biventricular failure. Last echo in [place] per report showed EF 40-45%. Cause of fall in EF uncertain, could be due to atrial fibrillation with RVR (tachycardia-mediated). Myocarditis, potentially from COVID-19, is possible.  She is now HD-dependent for volume control.  She takes Lasix on non-HD days, still makes some urine.  Despite HD, she is volume overloaded on exam today, NYHA class III symptoms. - Continue Toprol XL 37.5 mg bid.  - She needs more fluid off during her HD sessions, may need to go back to 4 days/week HD.  2. Valvular heart disease: Patient was supposed to have mitral and tricuspid valve replacements in 11/20 in [place].  TEE in 9/20 in [place] showed mod-severe MR and severe TR. On my read of the echo done here, she has moderate MR and moderate TR (both central and likely functional). TEE on 10/26/19 showed mod-severe TR and moderate MR.  Echo in 5/21 showed moderate MR and moderate TR.  Her main issue seems to be biventricular failure and would hold off on valve procedures for the time being.     3. Atrial fibrillation: Chronic per outpatient notes. Per daughter, she has had cardioversions in the past. She failed DCCV on amiodarone on 10/26/19. I think she is unlikely to go back into NSR at this point.  She is now off amiodarone.  - Continue Toprol XL 37.5 mg bid for rate control.  - Continue apixaban 5 mg bid.  4. ESRD: Appears to be tolerating HD but needs more volume removal.  5. OSA: Patient has been diagnosed with OSA in the past but wants a nasal prongs CPAP (not using CPAP currently).    - had sleep study in 5/21 but has not yet started CPAP.  Will send a message to Dr. Radford Pax.   Followup in 4 months.   Melissa Adams 08/15/2020

## 2020-08-17 ENCOUNTER — Ambulatory Visit (HOSPITAL_COMMUNITY)
Admission: RE | Admit: 2020-08-17 | Discharge: 2020-08-17 | Disposition: A | Discharge status: Home / Self Care | Payer: Medicare HMO | Source: Ambulatory Visit | Attending: Physician Assistant | Admitting: Physician Assistant

## 2020-08-17 ENCOUNTER — Other Ambulatory Visit: Payer: Self-pay

## 2020-08-17 ENCOUNTER — Other Ambulatory Visit (HOSPITAL_COMMUNITY): Payer: Self-pay | Admitting: Physician Assistant

## 2020-08-17 ENCOUNTER — Ambulatory Visit (HOSPITAL_COMMUNITY)
Admission: RE | Admit: 2020-08-17 | Discharge: 2020-08-17 | Disposition: A | Discharge status: Home / Self Care | Payer: Medicare HMO | Source: Ambulatory Visit | Attending: Nurse Practitioner | Admitting: Nurse Practitioner

## 2020-08-17 ENCOUNTER — Telehealth: Payer: Self-pay | Admitting: *Deleted

## 2020-08-17 DIAGNOSIS — J90 Pleural effusion, not elsewhere classified: Secondary | ICD-10-CM | POA: Diagnosis present

## 2020-08-17 DIAGNOSIS — G4730 Sleep apnea, unspecified: Secondary | ICD-10-CM

## 2020-08-17 HISTORY — PX: IR THORACENTESIS ASP PLEURAL SPACE W/IMG GUIDE: IMG5380

## 2020-08-17 MED ORDER — LIDOCAINE HCL (PF) 1 % IJ SOLN
INTRAMUSCULAR | Status: AC | PRN
  Administered 2020-08-17: 10 mL

## 2020-08-17 MED ORDER — LIDOCAINE HCL 1 % IJ SOLN
INTRAMUSCULAR | Status: AC
  Filled 2020-08-17: qty 20

## 2020-08-17 NOTE — Telephone Encounter (Signed)
-----   Message from Sueanne Margarita, MD sent at 08/16/2020  8:34 AM EDT ----- She cancelled her CPAP titration in August>>Nina please get this rescheduled  Traci ----- Message ----- From: [name], MD Sent: 08/15/2020  10:25 PM EDT To: Sueanne Margarita, MD  Traci, this patient has been waiting to hear about her CPAP, had study in 5/21.  Can your office followup? Thanks.

## 2020-08-17 NOTE — Procedures (Signed)
PROCEDURE SUMMARY:  Successful image-guided right thoracentesis. Yielded 900 milliliters of serosanguineous fluid. Patient tolerated procedure well. EBL: Zero No immediate complications.  Specimen was not sent for labs. Post procedure CXR shows no pneumothorax.  Please see imaging section of Epic for full dictation.  Joaquim Nam PA-C 08/17/2020 9:13 AM

## 2020-08-21 ENCOUNTER — Telehealth: Payer: Self-pay | Admitting: *Deleted

## 2020-08-21 NOTE — Telephone Encounter (Signed)
PA request for CPAP titration submitted to [facility] via web portal.

## 2020-08-22 ENCOUNTER — Telehealth (HOSPITAL_COMMUNITY): Payer: Self-pay

## 2020-08-22 DIAGNOSIS — E7800 Pure hypercholesterolemia, unspecified: Secondary | ICD-10-CM

## 2020-08-22 DIAGNOSIS — I5022 Chronic systolic (congestive) heart failure: Secondary | ICD-10-CM

## 2020-08-22 MED ORDER — ATORVASTATIN CALCIUM 80 MG PO TABS
80.0000 mg | ORAL_TABLET | Freq: Every day | ORAL | 5 refills | Status: DC
Start: 2020-08-22 — End: 2021-02-26

## 2020-08-22 NOTE — Telephone Encounter (Signed)
-----   Message from [name], MD sent at 08/20/2020 11:44 PM EDT ----- Aim for LDL < 100, increase atorvastatin to 80 mg daily with lipids/LFTs in 2 months.

## 2020-08-22 NOTE — Telephone Encounter (Signed)
Pts daughter Chong Sicilian aware of results and recommendations to increase lipitor to 80mg  daily. Verbalized understanding. Will rtc in 2 months for repeat.

## 2020-08-29 ENCOUNTER — Telehealth: Payer: Self-pay | Admitting: *Deleted

## 2020-08-29 NOTE — Telephone Encounter (Signed)
Staff message to Gae Bon ok to schedule titration sleep study. [employer] Auth received. Auth # [id number]. Valid dates 08/31/20 to 10/01/20.

## 2020-08-30 NOTE — Telephone Encounter (Signed)
  Lauralee Evener, CMA  Freada Bergeron, CMA Ok to schedule titration. [employer] Auth received. Josem Kaufmann #[id number]. Valid dates 09/01/20 to 10/01/20.          ----- Message -----  From: Freada Bergeron, CMA  Sent: 08/17/2020  4:48 PM EDT  To: [facility]  Subject: precert                      Cpap titration

## 2020-08-30 NOTE — Telephone Encounter (Signed)
Patient is scheduled for CPAP Titration on 10/01/20 Patient understands his titration study will be done at [facility] sleep lab. Patient understands he will receive a letter in a week or so detailing appointment, date, time, and location. . Left detailed message on voicemail with date and time of titration and informed patient to call back to confirm or reschedule.

## 2020-08-31 ENCOUNTER — Other Ambulatory Visit (INDEPENDENT_AMBULATORY_CARE_PROVIDER_SITE_OTHER): Payer: Medicare HMO

## 2020-08-31 ENCOUNTER — Encounter: Payer: Self-pay | Admitting: Nurse Practitioner

## 2020-08-31 ENCOUNTER — Telehealth: Payer: Self-pay | Admitting: Nurse Practitioner

## 2020-08-31 ENCOUNTER — Ambulatory Visit (INDEPENDENT_AMBULATORY_CARE_PROVIDER_SITE_OTHER): Payer: Medicare HMO | Admitting: Nurse Practitioner

## 2020-08-31 ENCOUNTER — Telehealth (HOSPITAL_COMMUNITY): Payer: Self-pay

## 2020-08-31 VITALS — BP 82/42 | HR 48 | Ht 63.0 in | Wt 213.4 lb

## 2020-08-31 DIAGNOSIS — I509 Heart failure, unspecified: Secondary | ICD-10-CM | POA: Diagnosis not present

## 2020-08-31 DIAGNOSIS — R188 Other ascites: Secondary | ICD-10-CM

## 2020-08-31 DIAGNOSIS — K7460 Unspecified cirrhosis of liver: Secondary | ICD-10-CM

## 2020-08-31 DIAGNOSIS — R945 Abnormal results of liver function studies: Secondary | ICD-10-CM

## 2020-08-31 DIAGNOSIS — R7989 Other specified abnormal findings of blood chemistry: Secondary | ICD-10-CM

## 2020-08-31 LAB — CBC WITH DIFFERENTIAL/PLATELET
Basophils Absolute: 0 10*3/uL (ref 0.0–0.1)
Basophils Relative: 0.4 % (ref 0.0–3.0)
Eosinophils Absolute: 0 10*3/uL (ref 0.0–0.7)
Eosinophils Relative: 0 % (ref 0.0–5.0)
HCT: 39.3 % (ref 36.0–46.0)
Hemoglobin: 12.3 g/dL (ref 12.0–15.0)
Lymphocytes Relative: 22.1 % (ref 12.0–46.0)
Lymphs Abs: 1.3 10*3/uL (ref 0.7–4.0)
MCHC: 31.2 g/dL (ref 30.0–36.0)
MCV: 100.3 fl — ABNORMAL HIGH (ref 78.0–100.0)
Monocytes Absolute: 1 10*3/uL (ref 0.1–1.0)
Monocytes Relative: 17.2 % — ABNORMAL HIGH (ref 3.0–12.0)
Neutro Abs: 3.7 10*3/uL (ref 1.4–7.7)
Neutrophils Relative %: 60.3 % (ref 43.0–77.0)
Platelets: 137 10*3/uL — ABNORMAL LOW (ref 150.0–400.0)
RBC: 3.91 Mil/uL (ref 3.87–5.11)
RDW: 16.4 % — ABNORMAL HIGH (ref 11.5–15.5)
WBC: 6.1 10*3/uL (ref 4.0–10.5)

## 2020-08-31 LAB — PROTIME-INR
INR: 1.8 ratio — ABNORMAL HIGH (ref 0.8–1.0)
Prothrombin Time: 20.2 s — ABNORMAL HIGH (ref 9.6–13.1)

## 2020-08-31 NOTE — Telephone Encounter (Signed)
Advised that she should go to ED for EKG and needs to stop metoprolol.   Set up follow up next week.   Thank you.  Jibreel Fedewa NP-C  10:10 PM

## 2020-08-31 NOTE — Patient Instructions (Signed)
Your provider has requested that you go to the basement level for lab work before leaving today. Press "B" on the elevator. The lab is located at the first door on the left as you exit the elevator.  We have advised you to go to the ER, but you have declined  HOLD metoprolol  Follow up with cardiology  Our office will contact you to schedule a abdominal/Pelvis CT scan after lab results are reviewed   Follow up with Dr Cathleen Corti in 4 weeks  Due to recent changes in healthcare laws, you may see the results of your imaging and laboratory studies on MyChart before your provider has had a chance to review them.  We understand that in some cases there may be results that are confusing or concerning to you. Not all laboratory results come back in the same time frame and the provider may be waiting for multiple results in order to interpret others.  Please give [place] 48 hours in order for your provider to thoroughly review all the results before contacting the office for clarification of your results.   I appreciate the  opportunity to care for you  Thank You   Noralyn Pick, NP

## 2020-08-31 NOTE — Telephone Encounter (Signed)
Melissa Adams was seen in our office today at 3:30pm. Her presenting vital signs showed a HR 36 and BP 84/46. She was asymptomatic. No CP, SOB or dizziness. Her daughter was present. She has a significant cardiac history including CHF, MR, TR and afib. She is on Metoprolol 37.5mg  po bid. I called her cardiologist Dr. Aundra Dubin during the patient's appointment, however, he was in procedures. His staff communicated with Amy Cleggs NP who advised the patient to hold her Metoprolol and to go to the ER for labs, EKG and further evaluation. The patient refused to go to the ER as she stated she felt fine. The patient is aware she is at risk for MI, CVA and death if her HR goes lower. Dr. Claris Gladden office staff stated they will contact the patient for further cardiology follow up tomorrow. The patient agreed to go to the ED if she developed profound fatigue, weakness, CP, SOB or dizziness.

## 2020-08-31 NOTE — Progress Notes (Signed)
08/31/2020 Melissa Adams [id number] 07-30-1946   CHIEF COMPLAINT:  Scarring in my liver   HISTORY OF PRESENT ILLNESS:  Melissa Adams. Haseley is a 74 year old female with a past medical history of arthritis, asthma, hypertension, hyperlipidemia, cardiomyopathy, CHF with LV EF 30%- 35%, mitral and tricuspid valve regurgitation 03/2020, atrial fibrillation on Eliquis, pulmonary hypertension, CKD on hemodialysis Q Mon-Wed-Fridays since 08/2019, sleep apnea and colon polyps.    She presents to our office today for further evaluation for possible cirrhosis. She was admitted to [facility] 06/10/2020 with a recurrent right pleural effusion and generalized abdominal pain. Labs in the ED showed PLT 127, Cr. 5.65. Alk phos 275. AST 43. T. Bili 2.1. LDH 295. A chest  CT confirmed a right pleural effusion, moderate cardiomyopathy, gallstones, cirrhosis with upper abdominal ascites and atherosclerosis. An abdominal sonogram 06/12/2020 showed a nodular hepatic contour suspicious for cirrhosis with a trace amount of ascites, not enough for a paracentesis. Gallstones and sludge with slightly increased gallbladder wall thickness and a right pleural effusion were noted. She underwent a thoracentesis 7/18 and 1.4 L of pleural fluid was removed. Pleural fluid total nucleated cells 999. Neutrophil count 26. Protein 3.1. LDH 135.  She underwent a  thoracentesis  05/05/2020 and 1.12L of pleural fluid was removed at that time. Her pulmonary status remained stable. She was discharged home on 06/14/2020 with the recommendations to schedule a GI follow up for cirrhosis evaluation.   She underwent a 3rd thoracentesis on 9/23/2021and 911m of serosanguineous fluid was removed.   She presents today for further cirrhosis evaluation. She is accompanied by her daughter, Melissa Adams Her heart rate is 36 and her BP is 84/46. She denies having any CP, SOB or dizziness. She last took her Metoprolol 37.537mthis am. She had her  routine dialysis session yesterday. She stated her BP was very low after her dialysis and she was given a second dose of Midodrine. She routinely takes Midodrine 1020mn the am on dialysis days. She was monitored for an extended period of time then discharged home. I called her cardiologist Dr. McCMarigene Ehlersile she was in our office. His NP Amy CleNinfa Meekervised the patient to go to the ER for labs, EKG and further evaluation, however, the patient refused to do so. Dr. McCMarcial Pacasfice staff stated they would follow up with the patient tomorrow. In the mean time, the patient agreed to go to the ED if she developed any CP, SOB, dizziness or profound fatigue.   We proceeded with her office consultation. She denies having any prior history of gallstones or liver disease.  No history of alcohol abuse. No history of dug use.  No known exposure to hepatitis B or C. No family history of liver disease. Her appetite is good. No weight loss. No confusion. No N/V. No upper abdominal pain. No GERD symptoms. No lower abdominal pain. She is passing a normal brown BM daily. [place] which she reported was normal. She underwent a colonoscopy at the age of 50 4ich she reported having a few colon polyps removed.    CBC Latest Ref Rng & Units 07/01/2020 06/22/2020 06/14/2020  WBC 4.0 - 10.5 K/uL 5.0 6.2 5.2  Hemoglobin 12.0 - 15.0 g/dL 11.8(L) 11.4 12.3  Hematocrit 36 - 46 % 40.2 35.1 39.4  Platelets 150 - 400 K/uL 159 141(L) 105(L)   CMP Latest Ref Rng & Units 07/01/2020 06/22/2020 06/14/2020  Glucose 70 -  99 mg/dL 89 85 83  BUN 8 - 23 mg/dL 32(H) 28(H) 41(H)  Creatinine 0.44 - 1.00 mg/dL 5.32(H) 5.16(H) 7.36(H)  Sodium 135 - 145 mmol/L 138 141 137  Potassium 3.5 - 5.1 mmol/L 4.4 4.6 3.9  Chloride 98 - 111 mmol/L 95(L) 94(L) 98  CO2 22 - 32 mmol/L 28 31(H) 23  Calcium 8.9 - 10.3 mg/dL 9.3 9.1 7.9(L)  Total Protein 6.0 - 8.5 g/dL - 6.6 -  Total Bilirubin 0.0 - 1.2 mg/dL - 1.4(H) -    Alkaline Phos 48 - 121 IU/L - 308(H) -  AST 0 - 40 IU/L - 42(H) -  ALT 0 - 32 IU/L - 35(H) -    Abdominal sonogram 06/11/2020: Small volume abdominal ascites.  Abdominal sonogram 06/12/2020: 1. Nodular hepatic contour suspect for cirrhosis. Only trace amount of ascites is noted. 2. Gallstones and sludge. Nonspecific slight increased gallbladder wall thickness without sonographic Murphy. 3. Right pleural effusion   Past Medical History:  Diagnosis Date  . A-fib ([place])   . Allergies   . Arthritis   . Asthma   . Cardiomyopathy ([place])   . CHF (congestive heart failure) ([place])   . Chronic kidney disease   . Edema   . History of hemodialysis   . Hyperlipidemia   . Hypertension   . Pulmonary hypertension ([place])   . Sleep apnea   . Tricuspid regurgitation    Past Surgical History:  Procedure Laterality Date  . A/V FISTULAGRAM Left 05/23/2020   Procedure: A/V FISTULAGRAM;  Surgeon: [name], MD;  Location: [facility];  Service: Cardiovascular;  Laterality: Left;  . ABDOMINAL HYSTERECTOMY    . AV FISTULA PLACEMENT Left 11/04/2019   Procedure: Insertion Of Arteriovenous (Av) Gore-Tex Graft Arm;  Surgeon: [name], MD;  Location: [facility];  Service: Vascular;  Laterality: Left;  . CARDIAC CATHETERIZATION    . CARDIOVERSION N/A 10/26/2019   Procedure: CARDIOVERSION;  Surgeon: [name], MD;  Location: [facility] ENDOSCOPY;  Service: Cardiovascular;  Laterality: N/A;  . COLONOSCOPY W/ BIOPSIES AND POLYPECTOMY    . FISTULA SUPERFICIALIZATION Left 01/21/2020   Procedure: FISTULA SUPERFICIALIZATION OF FISTULA;  Surgeon: [name], MD;  Location: [place];  Service: Vascular;  Laterality: Left;  . INSERTION OF DIALYSIS CATHETER Right 11/04/2019   Procedure: CONVERT TEMPORARY DIALYSIS CATHETER TO TUNNELED DIALYSIS CATHETER Right Internal Jugular.;  Surgeon: [name], MD;  Location: [place];  Service: Vascular;  Laterality: Right;  . IR THORACENTESIS  ASP PLEURAL SPACE W/IMG GUIDE  08/17/2020  . MULTIPLE TOOTH EXTRACTIONS    . REVISION OF ARTERIOVENOUS GORETEX GRAFT Left 01/21/2020   Procedure: REVISION OF ARTERIOVENOUS FISTULA WITH SIDE BRANCH LIGATION;  Surgeon: [name], MD;  Location: [place];  Service: Vascular;  Laterality: Left;  . TEE WITHOUT CARDIOVERSION N/A 10/26/2019   Procedure: TRANSESOPHAGEAL ECHOCARDIOGRAM (TEE);  Surgeon: [name], MD;  Location: [facility] ENDOSCOPY;  Service: Cardiovascular;  Laterality: N/A;  . THORACENTESIS     x 2    Social History: She is widowed. Disabled. She has one son and one daughter. Past smoker. Denies alcohol or drug use.     Family History: family history includes CAD in her father; Diabetes in her sister; Hypertension in her sister; Lupus in her sister; Seizures in her father.    Allergies  Allergen Reactions  . Black Walnut Flavor Anaphylaxis  . Shellfish Allergy Itching    Makes throat itch  Outpatient Encounter Medications as of 08/31/2020  Medication Sig  . acetaminophen (TYLENOL) 325 MG tablet Take 2 tablets (650 mg total) by mouth every 4 (four) hours as needed for headache or mild pain.  Marland Kitchen albuterol (VENTOLIN HFA) 108 (90 Base) MCG/ACT inhaler Inhale 2 puffs into the lungs every 6 (six) hours as needed for wheezing or shortness of breath.  . allopurinol (ZYLOPRIM) 300 MG tablet Take 1 tablet (300 mg total) by mouth at bedtime.  Marland Kitchen apixaban (ELIQUIS) 5 MG TABS tablet Take 1 tablet (5 mg total) by mouth 2 (two) times daily.  Marland Kitchen atorvastatin (LIPITOR) 80 MG tablet Take 1 tablet (80 mg total) by mouth at bedtime.  . cetirizine (ZYRTEC) 10 MG tablet Take 10 mg by mouth daily as needed for allergies.  . cinacalcet (SENSIPAR) 30 MG tablet Take 30 mg by mouth daily with supper.   . ferric citrate (AURYXIA) 1 GM 210 MG(Fe) tablet Take 210 mg by mouth 3 (three) times daily with meals.  . furosemide (LASIX) 40 MG tablet Take 80 mg by mouth See admin instructions. Take 2  tablets (80 mg) by mouth twice daily on non-dialysis days - Sunday, Tuesday, Thursday  . lidocaine-prilocaine (EMLA) cream Apply 1 application topically See admin instructions. Apply topically to port access one hour prior to dialysis - Monday, Wednesday, Friday, Saturday  . metoprolol succinate (TOPROL-XL) 25 MG 24 hr tablet Take 1.5 tablets (37.5 mg total) by mouth 2 (two) times daily.  . midodrine (PROAMATINE) 10 MG tablet Take 10 mg by mouth See admin instructions. Take one tablet (10 mg) by mouth on Monday, Wednesday, Friday, Saturday prior to dialysis  . montelukast (SINGULAIR) 10 MG tablet Take 1 tablet by mouth once daily  . multivitamin (RENA-VIT) TABS tablet Take 1 tablet by mouth daily.  . vitamin C (VITAMIN C) 500 MG tablet Take 1 tablet (500 mg total) by mouth daily.   No facility-administered encounter medications on file as of 08/31/2020.     REVIEW OF SYSTEMS: All other systems reviewed and negative except where noted in the History of Present Illness.  Gen: + fatigue. Denies fever, sweats or chills. No weight loss.  CV: Denies chest pain, palpitations or dizziness.  Resp: + SOB and cough.  GI: See HPI.  GU : On hemodialysis.  MS: + back pain, arthritis. Muscle cramps.  Derm: Denies rash, itchiness, skin lesions or unhealing ulcers. Psych: Denies depression, anxiety or memory loss.  Heme: Denies bruising, bleeding. Neuro:  Denies headaches, dizziness or paresthesias. Endo:  + DM.   PHYSICAL EXAM: BP (!) 84/46   Pulse (!) 36   Ht '5\' 3"'  (1.6 m)   Wt 213 lb 6.4 oz (96.8 kg)   BMI 37.80 kg/m    General: Well developed  74 year old female in no acute distress. Head: Normocephalic and atraumatic. Eyes:  Sclerae non-icteric, conjunctive pink. Ears: Normal auditory acuity. Mouth: Dentition intact. No ulcers or lesions.  Neck: Supple, no lymphadenopathy or thyromegaly.  Lungs: Clear bilaterally to auscultation without wheezes, crackles or rhonchi. Diminished in the  bases.  Heart: Distant S1, S2. Irregular rhythm.  No murmur, rub or gallop appreciated.  Abdomen: Soft, nontender. Protuberant.  No masses. No hepatosplenomegaly. Normoactive bowel sounds x 4 quadrants.  Rectal: Deferred.  Musculoskeletal: Symmetrical with no gross deformities. Skin: Warm and dry. No rash or lesions on visible extremities. Extremities: Bilateral LEs with 1+ edema. LUE fistula with + bruit and thrill.  Neurological: Alert oriented x 4, no focal deficits.  Psychological:  Alert and cooperative. Normal mood and affect.  ASSESSMENT AND PLAN:  47. 74 year old female with elevated LFTs, cirrhosis per abdominal sono.  I suspect her cirrhosis is secondary to severe right sided heart failure/severe tricuspid regurgitation resulting in congestive hepatopathy. Labs 7/29: Alk phos 308. AST 42. ALT 35. T. Bili 1.4.  -AFP, BMP, hepatic panel, CBC, PT/INR, iron, ferritin, ANA, AMA, SMA, IGG, A1AT, ceruloplasmin, Hep A, B and  C serologies  -Patient declines abdominal MRI/MRCP due to anxiety/claustophobia. Will order CTAP after the above lab results received. -Low sodium diet -Diuretics per cardiology  -Further recommendations per Dr. Bryan Lemma   2. Recurrent right pleural effusion/hepatic hydrothorax   3. Chronic systolic CHF. EF 30 -35%  4. Macrocytic anemia. Hg 11.8. MCV 105.  -CBC  5. Thrombocytopenia secondary to suspected cirrhosis, however, PLT count improving 141 -> 159.  -CBC   6. Atrial fibrillation on Eliquis.  HR 36. Patient is asymptomatic. Advised by cardiology to go to the ED for stat labs and EKG, patient declined.  -Hold Metoprolol -Follow up with cardiology   7. ESRD on HD   8. Gallstones     CC:  [name], NP

## 2020-08-31 NOTE — Telephone Encounter (Signed)
Melissa Banda, NP from GI called to report that the patient was currently in office and her HR was 36 and BP was low in the 80's. Colleen wanted to know if the patient should stop her Metoprolol. Melissa Adams also reported that this has been an ongoing issue for [name] and that she did have some problems with this same issue at Dialysis on yesterday(08/30/20).

## 2020-09-01 LAB — IRON: Iron: 71 ug/dL (ref 42–145)

## 2020-09-01 LAB — HEPATIC FUNCTION PANEL
ALT: 32 U/L (ref 0–35)
AST: 43 U/L — ABNORMAL HIGH (ref 0–37)
Albumin: 4 g/dL (ref 3.5–5.2)
Alkaline Phosphatase: 325 U/L — ABNORMAL HIGH (ref 39–117)
Bilirubin, Direct: 0.8 mg/dL — ABNORMAL HIGH (ref 0.0–0.3)
Total Bilirubin: 1.5 mg/dL — ABNORMAL HIGH (ref 0.2–1.2)
Total Protein: 7.3 g/dL (ref 6.0–8.3)

## 2020-09-01 LAB — BASIC METABOLIC PANEL
BUN: 30 mg/dL — ABNORMAL HIGH (ref 6–23)
CO2: 30 mEq/L (ref 19–32)
Calcium: 7.8 mg/dL — ABNORMAL LOW (ref 8.4–10.5)
Chloride: 94 mEq/L — ABNORMAL LOW (ref 96–112)
Creatinine, Ser: 6.01 mg/dL (ref 0.40–1.20)
GFR: 6.35 mL/min — CL (ref >60.00)
Glucose, Bld: 77 mg/dL (ref 70–99)
Potassium: 4 mEq/L (ref 3.5–5.1)
Sodium: 139 mEq/L (ref 135–145)

## 2020-09-01 LAB — FERRITIN: Ferritin: 663 ng/mL — ABNORMAL HIGH (ref 10.0–291.0)

## 2020-09-04 LAB — HEPATITIS A ANTIBODY, TOTAL: Hepatitis A AB,Total: NONREACTIVE

## 2020-09-04 LAB — HEPATITIS C ANTIBODY
Hepatitis C Ab: NONREACTIVE
SIGNAL TO CUT-OFF: 0.01 (ref <1.00)

## 2020-09-04 LAB — MITOCHONDRIAL ANTIBODIES: Mitochondrial M2 Ab, IgG: <=20 U

## 2020-09-04 LAB — AFP TUMOR MARKER: AFP-Tumor Marker: 3.4 ng/mL

## 2020-09-04 LAB — IGG: IgG (Immunoglobin G), Serum: 1035 mg/dL (ref 600–1540)

## 2020-09-04 LAB — HEPATITIS B CORE ANTIBODY, TOTAL: Hep B Core Total Ab: NONREACTIVE

## 2020-09-04 LAB — ANTI-SMOOTH MUSCLE ANTIBODY, IGG: Actin (Smooth Muscle) Antibody (IGG): <20 U (ref <20)

## 2020-09-04 LAB — ANA: Anti Nuclear Antibody (ANA): NEGATIVE

## 2020-09-04 LAB — HEPATITIS B SURFACE ANTIBODY,QUALITATIVE: Hep B S Ab: NONREACTIVE

## 2020-09-04 LAB — HEPATITIS B SURFACE ANTIGEN: Hepatitis B Surface Ag: NONREACTIVE

## 2020-09-04 LAB — CERULOPLASMIN: Ceruloplasmin: 46 mg/dL (ref 18–53)

## 2020-09-04 LAB — ALPHA-1-ANTITRYPSIN: A-1 Antitrypsin, Ser: 214 mg/dL — ABNORMAL HIGH (ref 83–199)

## 2020-09-04 NOTE — Progress Notes (Signed)
Agree with the assessment and plan as outlined by Colleen Kennedy-Smith, NP.   Karrie Fluellen, DO, FACG [facility]   

## 2020-09-05 ENCOUNTER — Emergency Department (HOSPITAL_COMMUNITY)
Admission: EM | Admit: 2020-09-05 | Discharge: 2020-09-05 | Disposition: A | Discharge status: Home / Self Care | Payer: Medicare HMO | Attending: Emergency Medicine | Admitting: Emergency Medicine

## 2020-09-05 ENCOUNTER — Telehealth (INDEPENDENT_AMBULATORY_CARE_PROVIDER_SITE_OTHER): Payer: Self-pay | Admitting: *Deleted

## 2020-09-05 ENCOUNTER — Encounter (HOSPITAL_COMMUNITY): Payer: Self-pay | Admitting: Emergency Medicine

## 2020-09-05 ENCOUNTER — Emergency Department (HOSPITAL_COMMUNITY): Payer: Medicare HMO

## 2020-09-05 ENCOUNTER — Other Ambulatory Visit: Payer: Self-pay

## 2020-09-05 DIAGNOSIS — N186 End stage renal disease: Secondary | ICD-10-CM | POA: Insufficient documentation

## 2020-09-05 DIAGNOSIS — R001 Bradycardia, unspecified: Secondary | ICD-10-CM | POA: Diagnosis present

## 2020-09-05 DIAGNOSIS — I959 Hypotension, unspecified: Secondary | ICD-10-CM | POA: Insufficient documentation

## 2020-09-05 DIAGNOSIS — I132 Hypertensive heart and chronic kidney disease with heart failure and with stage 5 chronic kidney disease, or end stage renal disease: Secondary | ICD-10-CM | POA: Diagnosis not present

## 2020-09-05 DIAGNOSIS — J45909 Unspecified asthma, uncomplicated: Secondary | ICD-10-CM | POA: Diagnosis not present

## 2020-09-05 DIAGNOSIS — Z87891 Personal history of nicotine dependence: Secondary | ICD-10-CM | POA: Insufficient documentation

## 2020-09-05 DIAGNOSIS — Z79899 Other long term (current) drug therapy: Secondary | ICD-10-CM | POA: Diagnosis not present

## 2020-09-05 DIAGNOSIS — Z7901 Long term (current) use of anticoagulants: Secondary | ICD-10-CM | POA: Diagnosis not present

## 2020-09-05 DIAGNOSIS — R55 Syncope and collapse: Secondary | ICD-10-CM | POA: Insufficient documentation

## 2020-09-05 DIAGNOSIS — I5023 Acute on chronic systolic (congestive) heart failure: Secondary | ICD-10-CM | POA: Diagnosis not present

## 2020-09-05 DIAGNOSIS — Z992 Dependence on renal dialysis: Secondary | ICD-10-CM | POA: Insufficient documentation

## 2020-09-05 LAB — BASIC METABOLIC PANEL
Anion gap: 14 (ref 5–15)
BUN: 32 mg/dL — ABNORMAL HIGH (ref 8–23)
CO2: 28 mmol/L (ref 22–32)
Calcium: 7.3 mg/dL — ABNORMAL LOW (ref 8.9–10.3)
Chloride: 95 mmol/L — ABNORMAL LOW (ref 98–111)
Creatinine, Ser: 6.88 mg/dL — ABNORMAL HIGH (ref 0.44–1.00)
GFR, Estimated: 5 mL/min — ABNORMAL LOW (ref >60)
Glucose, Bld: 87 mg/dL (ref 70–99)
Potassium: 3.8 mmol/L (ref 3.5–5.1)
Sodium: 137 mmol/L (ref 135–145)

## 2020-09-05 LAB — CBC
HCT: 38.5 % (ref 36.0–46.0)
Hemoglobin: 11.7 g/dL — ABNORMAL LOW (ref 12.0–15.0)
MCH: 31.2 pg (ref 26.0–34.0)
MCHC: 30.4 g/dL (ref 30.0–36.0)
MCV: 102.7 fL — ABNORMAL HIGH (ref 80.0–100.0)
Platelets: 128 10*3/uL — ABNORMAL LOW (ref 150–400)
RBC: 3.75 MIL/uL — ABNORMAL LOW (ref 3.87–5.11)
RDW: 16.5 % — ABNORMAL HIGH (ref 11.5–15.5)
WBC: 6.1 10*3/uL (ref 4.0–10.5)
nRBC: 0 % (ref 0.0–0.2)

## 2020-09-05 LAB — CBG MONITORING, ED: Glucose-Capillary: 74 mg/dL (ref 70–99)

## 2020-09-05 NOTE — Telephone Encounter (Signed)
Pt's daughter [name] given recommendation per [name], NP that pt be evaluated in the ED;  the pt's daughter says at 1340  the pt's  BP was 103/57, HR 47, Oxygen 97% on 2 L Oxygen; reiterated recommendation for the pt to be evaluated in the ED; she verbalized understanding; Vita Barley, NP notified; will route to office for notification of encounter

## 2020-09-05 NOTE — ED Notes (Signed)
Pt tolerated activity well. Pt endorsed feeling unsteady just after standing but denied feeling lightheaded or dizzy.   EDP notified of these findings.

## 2020-09-05 NOTE — Telephone Encounter (Signed)
Melissa Share, NP from [facility] called to report the pt's daughter contacted her stating the pt had "kalidoscope vision on both eyes for 20 minutes"; she no other symptoms and at 1230 her BP was 70/40 and HR 48-54; the daughter was directed to give the pt Midodrine 10 mg and her BP 80/50, HR 88; the pt's initial oxygen sat was 94 but dropped to 85; her daughter was instructed to place the pt's prn oxygen at 2 L/min; at present the daughter could not get her oxygen sat to pick up; Marcie Bal relayed that the pt has no symptoms such as SOB, CP, dizziness, andhe vision returned to normal;  The pt recieves HD on M, W, F;[name] would like to know if [name] would like for [facility] to evaluate the pt' [name] can be contacted at [phone]; the pt's daughterPattie can be contacted at [phone]; attempted to notify office but no answer.

## 2020-09-05 NOTE — Telephone Encounter (Signed)
Sent to PCP ?

## 2020-09-05 NOTE — ED Provider Notes (Signed)
[facility] EMERGENCY DEPARTMENT Provider Note   CSN: [id number] Arrival date & time: 09/05/20  1458     History Chief Complaint  Patient presents with  . Bradycardia    Melissa Adams is a 74 y.o. female.  Patient is a 74 year old female with a history of atrial fibrillation, cardiomyopathy, CHF, end-stage renal disease on dialysis Monday Wednesday Friday, recurrent hypotension requiring midodrine, hypertension and hyperlipidemia who is presenting today with an episode of dizziness and vision changes.  Patient reports that she has been in her normal state of health over the last few weeks but always has issues with her blood pressure fluctuating.  She did see the GI doctor on Friday and they reported at that time her blood pressure was low but she was feeling her normal self and did not want to go for evaluation.  They did tell her at that appointment to hold her metoprolol due to bradycardia and low blood pressure.  She had not taken the metoprolol for several days and her numbers were looking okay.  Today her blood pressure was okay so she took a dose of her metoprolol.  Around noon she got up and went to brush her teeth and reports that she started feeling dizzy and it look like a kaleidoscope in her vision.  She reports this lasted for approximately 10 or 15 minutes and seemed to improve after she sat down.  She had no nausea or vomiting.  She denies any chest pain, abdominal pain and reports that she has not had any increased swelling in her legs or worsening shortness of breath.  She reports she always has some shortness of breath but it does not seem bad today.  She does wear 2 to 3 L of oxygen chronically and has not changed.  Her daughter called EMS today because when she was having the vision changes they checked her blood pressure and it was 70/40.  They spoke with home health and they recommended she take a dose of her midodrine which she did around noon.  However  her blood pressure remained low in the 40J systolic and she called 811 for further evaluation.  When EMS arrived patient was awake and alert and denied any complaints.  Pulse at home was approximately 48 and blood pressure was in the 91Y systolic.  Patient reports that she has had a lot of stress this week because her granddaughter passed away but there is been no other significant change.  She has not had a new cough, fever, vomiting or diarrhea.  She has been eating.  Last session of dialysis was yesterday.  The history is provided by the patient and a relative.       Past Medical History:  Diagnosis Date  . A-fib ([place])   . Allergies   . Arthritis   . Asthma   . Cardiomyopathy ([place])   . CHF (congestive heart failure) ([place])   . Chronic kidney disease   . Edema   . History of hemodialysis   . Hyperlipidemia   . Hypertension   . Pulmonary hypertension ([place])   . Sleep apnea   . Tricuspid regurgitation     Patient Active Problem List   Diagnosis Date Noted  . Pleural effusion 06/11/2020  . Aortic atherosclerosis ([place]) 06/11/2020  . ESRD on dialysis ([place]) 01/11/2020  . Acute on chronic systolic heart failure ([place])   . Pressure injury of skin 10/22/2019  . Dyspnea   . COVID-19 virus infection   .  CHF exacerbation ([place]) 10/12/2019  . Acute CHF (congestive heart failure) ([place]) 10/11/2019  . Atrial fibrillation, chronic ([place]) 10/11/2019  . Essential hypertension 10/11/2019  . Sleep apnea 10/11/2019  . AKI (acute kidney injury) ([place]) 10/11/2019  . Gout 10/11/2019  . Normocytic anemia 10/11/2019    Past Surgical History:  Procedure Laterality Date  . A/V FISTULAGRAM Left 05/23/2020   Procedure: A/V FISTULAGRAM;  Surgeon: [name], MD;  Location: [facility];  Service: Cardiovascular;  Laterality: Left;  . ABDOMINAL HYSTERECTOMY    . AV FISTULA PLACEMENT Left 11/04/2019   Procedure: Insertion Of Arteriovenous (Av) Gore-Tex Graft Arm;  Surgeon: [name], MD;  Location: [facility];  Service: Vascular;  Laterality: Left;  . CARDIAC CATHETERIZATION    . CARDIOVERSION N/A 10/26/2019   Procedure: CARDIOVERSION;  Surgeon: [name], MD;  Location: [facility] ENDOSCOPY;  Service: Cardiovascular;  Laterality: N/A;  . COLONOSCOPY W/ BIOPSIES AND POLYPECTOMY    . FISTULA SUPERFICIALIZATION Left 01/21/2020   Procedure: FISTULA SUPERFICIALIZATION OF FISTULA;  Surgeon: [name], MD;  Location: [facility];  Service: Vascular;  Laterality: Left;  . INSERTION OF DIALYSIS CATHETER Right 11/04/2019   Procedure: CONVERT TEMPORARY DIALYSIS CATHETER TO TUNNELED DIALYSIS CATHETER Right Internal Jugular.;  Surgeon: [name], MD;  Location: [facility];  Service: Vascular;  Laterality: Right;  . IR THORACENTESIS ASP PLEURAL SPACE W/IMG GUIDE  08/17/2020  . MULTIPLE TOOTH EXTRACTIONS    . REVISION OF ARTERIOVENOUS GORETEX GRAFT Left 01/21/2020   Procedure: REVISION OF ARTERIOVENOUS FISTULA WITH SIDE BRANCH LIGATION;  Surgeon: [name], MD;  Location: [facility];  Service: Vascular;  Laterality: Left;  . TEE WITHOUT CARDIOVERSION N/A 10/26/2019   Procedure: TRANSESOPHAGEAL ECHOCARDIOGRAM (TEE);  Surgeon: [name], MD;  Location: [facility] ENDOSCOPY;  Service: Cardiovascular;  Laterality: N/A;  . THORACENTESIS     x 2     OB History   No obstetric history on file.     Family History  Problem Relation Age of Onset  . Seizures Father   . CAD Father   . Diabetes Sister   . Lupus Sister   . Hypertension Sister   . Colon cancer Neg Hx   . Esophageal cancer Neg Hx   . Pancreatic cancer Neg Hx   . Stomach cancer Neg Hx     Social History   Tobacco Use  . Smoking status: Former Smoker    Types: Cigarettes  . Smokeless tobacco: Never Used  Vaping Use  . Vaping Use: Never used  Substance Use Topics  . Alcohol use: Not Currently  . Drug use: Not Currently    Home Medications Prior to Admission medications   Medication Sig Start Date End  Date Taking? Authorizing Provider  acetaminophen (TYLENOL) 325 MG tablet Take 2 tablets (650 mg total) by mouth every 4 (four) hours as needed for headache or mild pain. 11/05/19   Kinnie Feil, MD  albuterol (VENTOLIN HFA) 108 (90 Base) MCG/ACT inhaler Inhale 2 puffs into the lungs every 6 (six) hours as needed for wheezing or shortness of breath. 11/30/19   [name], NP  allopurinol (ZYLOPRIM) 300 MG tablet Take 1 tablet (300 mg total) by mouth at bedtime. 06/14/20   [name], NP  apixaban (ELIQUIS) 5 MG TABS tablet Take 1 tablet (5 mg total) by mouth 2 (two) times daily. 12/15/19   [name], MD  atorvastatin (LIPITOR) 80 MG tablet Take 1 tablet (80  mg total) by mouth at bedtime. 08/22/20   [name], MD  cetirizine (ZYRTEC) 10 MG tablet Take 10 mg by mouth daily as needed for allergies.    [provider]  cinacalcet (SENSIPAR) 30 MG tablet Take 30 mg by mouth daily with supper.  04/18/20   [provider]  ferric citrate (AURYXIA) 1 GM 210 MG(Fe) tablet Take 210 mg by mouth 3 (three) times daily with meals.    [provider]  furosemide (LASIX) 40 MG tablet Take 80 mg by mouth See admin instructions. Take 2 tablets (80 mg) by mouth twice daily on non-dialysis days - Sunday, Tuesday, Thursday    [provider]  lidocaine-prilocaine (EMLA) cream Apply 1 application topically See admin instructions. Apply topically to port access one hour prior to dialysis - Monday, Wednesday, Friday, Saturday 04/07/20   [provider]  metoprolol succinate (TOPROL-XL) 25 MG 24 hr tablet Take 1.5 tablets (37.5 mg total) by mouth 2 (two) times daily. 07/05/20   [name], MD  midodrine (PROAMATINE) 10 MG tablet Take 10 mg by mouth See admin instructions. Take one tablet (10 mg) by mouth on Monday, Wednesday, Friday, Saturday prior to dialysis 02/10/20   [provider]  montelukast (SINGULAIR) 10 MG tablet Take 1 tablet by  mouth once daily 08/05/20   [name], NP  multivitamin (RENA-VIT) TABS tablet Take 1 tablet by mouth daily.    [provider]  vitamin C (VITAMIN C) 500 MG tablet Take 1 tablet (500 mg total) by mouth daily. 11/06/19   Kinnie Feil, MD    Allergies    Black walnut flavor and Shellfish allergy  Review of Systems   Review of Systems  Musculoskeletal:       Patient reports some low back pain today which she gets intermittently but nothing out of the ordinary. She did take some Tylenol.  All other systems reviewed and are negative.   Physical Exam Updated Vital Signs BP (!) 74/65 (BP Location: Right Arm)   Pulse (!) 51   Temp 98.6 F (37 C) (Oral)   Resp 16   SpO2 97%   Physical Exam Vitals and nursing note reviewed.  Constitutional:      General: She is not in acute distress.    Appearance: She is well-developed. She is obese.  HENT:     Head: Normocephalic and atraumatic.  Eyes:     Extraocular Movements: Extraocular movements intact.     Conjunctiva/sclera: Conjunctivae normal.     Pupils: Pupils are equal, round, and reactive to light.  Cardiovascular:     Rate and Rhythm: Regular rhythm. Bradycardia present. Frequent extrasystoles are present.    Heart sounds: No murmur heard.   Pulmonary:     Effort: Pulmonary effort is normal. No respiratory distress.     Breath sounds: Examination of the right-lower field reveals decreased breath sounds and rales. Examination of the left-lower field reveals decreased breath sounds. Decreased breath sounds and rales present. No wheezing.  Chest:     Chest wall: No tenderness.  Abdominal:     General: There is no distension.     Palpations: Abdomen is soft.     Tenderness: There is no abdominal tenderness. There is no guarding or rebound.  Musculoskeletal:        General: No tenderness. Normal range of motion.     Cervical back: Normal range of motion and neck supple.     Right lower leg: Edema  present.      Left lower leg: Edema present.     Comments: 2+ pitting edema bilaterally  Skin:    General: Skin is warm and dry.     Findings: No erythema or rash.  Neurological:     Mental Status: She is alert and oriented to person, place, and time.     Sensory: No sensory deficit.     Motor: No weakness.     Comments: No pronator drift or aphasia.  Normal finger to nose  Psychiatric:        Mood and Affect: Mood normal.        Behavior: Behavior normal.        Thought Content: Thought content normal.     ED Results / Procedures / Treatments   Labs (all labs ordered are listed, but only abnormal results are displayed) Labs Reviewed  BASIC METABOLIC PANEL - Abnormal; Notable for the following components:      Result Value   Chloride 95 (*)    BUN 32 (*)    Creatinine, Ser 6.88 (*)    Calcium 7.3 (*)    GFR, Estimated 5 (*)    All other components within normal limits  CBC - Abnormal; Notable for the following components:   RBC 3.75 (*)    Hemoglobin 11.7 (*)    MCV 102.7 (*)    RDW 16.5 (*)    Platelets 128 (*)    All other components within normal limits  URINALYSIS, ROUTINE W REFLEX MICROSCOPIC  CBG MONITORING, ED    EKG EKG Interpretation  Date/Time:  Tuesday September 05 2020 15:05:46 EDT Ventricular Rate:  95 PR Interval:    QRS Duration: 92 QT Interval:  396 QTC Calculation: 497 R Axis:   160 Text Interpretation: Accelerated Junctional rhythm with frequent Premature ventricular complexes in a pattern of bigeminy Right axis deviation Incomplete right bundle branch block Cannot rule out Anterior infarct , age undetermined ST & T wave abnormality, consider inferolateral ischemia similar to EKG on 10/11/19 Confirmed by Blanchie Dessert [phone]) on 09/05/2020 4:00:41 PM   Radiology CT Head Wo Contrast  Result Date: 09/05/2020 CLINICAL DATA:  Dizziness. EXAM: CT HEAD WITHOUT CONTRAST TECHNIQUE: Contiguous axial images were obtained from the base of the skull through the  vertex without intravenous contrast. COMPARISON:  None. FINDINGS: Brain: No evidence of acute infarction, hemorrhage, hydrocephalus, extra-axial collection or mass lesion/mass effect. Mild diffuse cerebral atrophy with ex vacuo ventricular dilation. Mild patchy white matter hypoattenuation, likely the sequela of chronic microvascular ischemic disease. Calcification along the anterior falx. Vascular: Calcific atherosclerosis. Skull: Normal. Negative for fracture or focal lesion. Sinuses/Orbits: The sinuses are clear. No acute orbital abnormality. Other: No mastoid effusions. IMPRESSION: No evidence of acute intracranial abnormality. Electronically Signed   By: Margaretha Sheffield MD   On: 09/05/2020 17:29   DG Chest Port 1 View  Result Date: 09/05/2020 CLINICAL DATA:  Dizzy.  Additional provided: Bradycardia,. EXAM: PORTABLE CHEST 1 VIEW COMPARISON:  Prior chest radiographs 08/17/2020. Chest CT 06/10/2020. FINDINGS: Please note portions of the left lateral costophrenic angle are excluded from the field of view. Unchanged cardiomegaly. Aortic atherosclerosis. Interval increase in size of a small to moderate right pleural effusion since the prior examination of 08/17/2020. Additional right basilar opacity which may reflect atelectasis or pneumonia. A rounded lucency within the right lung base likely reflects aerated lung. There is fluid tracking within the minor fissure. Within described limitations, the left lung is clear and there is no  appreciable left-sided pleural effusion. No evidence of pneumothorax. No acute bony abnormality identified. IMPRESSION: Please note portions of the left lateral costophrenic angle are excluded from the field of view. Interval increase in size of a small to moderate right pleural effusion since the prior exam of 08/17/2020. Additional right basilar opacity, which may reflect compressive atelectasis and/or pneumonia. Unchanged cardiomegaly. Aortic Atherosclerosis (ICD10-I70.0).  Electronically Signed   By: Kellie Simmering DO   On: 09/05/2020 16:53    Procedures Procedures (including critical care time)  Medications Ordered in ED Medications - No data to display  ED Course  I have reviewed the triage vital signs and the nursing notes.  Pertinent labs & imaging results that were available during my care of the patient were reviewed by me and considered in my medical decision making (see chart for details).    MDM Rules/Calculators/A&P                          Elderly female with multiple medical problems presenting today with complaint of dizziness and vision changes for approximately 10 to 15 minutes today while at home. This was in the setting of standing up and going to brush her teeth and resolved when she sat down. Also at this time her daughter checked her blood pressure and it was low at 70/40 with a heart rate in the 40s. Patient denied any chest pain or shortness of breath. She has been under a lot of stress this week but denies change in her eating and denies any recent infectious symptoms. Patient is well-appearing on exam and has no complaints at this time. However patient had been holding her metoprolol for the last few days and took a dose today prior to the symptoms occurring. She then took a dose of midodrine as recommended by her doctor over the phone with improvement of her blood pressure. Today's EKG shows that patient has bigeminy and most likely underlying atrial fibrillation. She has been in this rhythm in the past. Also she typically has bradycardia when looking at past visits heart rate seems to be between 40s and 50s regularly. Patient also struggles with fluctuating blood pressure and has to take midodrine before dialysis. Looking at recent blood pressures she ranges anywhere between 62E systolic to low 366Q. Daughter confirms this to that it has been similar at home. Patient is displaying no strokelike symptoms today. Low suspicion for ACS. Patient  does have evidence of fluid overload but this may be her baseline. She is satting 97% on her home 2 L. Patient's blood sugar today is within normal limits, BMP with normal potassium and sodium. Patient's calcium is low at 7.3 but unchanged. CBC with stable hemoglobin of 11.7. Orthostatics were unchanged and she had blood pressures in the 80s throughout but heart rate did increase to 90 with standing. Patient denied any dizziness but stated she felt a little off balance with standing.  5:40 PM Patient's head CT is unchanged without any new acute issues.  Patient remains asymptomatic here.  Chest x-ray shows interval increase in size of a small to moderate right pleural effusion since 08/17/2020 and most likely compressive atelectasis as patient is not having symptoms of pneumonia.  Echo done on 521 of this year showed an EF of 30 to 35%.  5:57 PM On reevaluation patient's heart rate is now in the 80s consistently.  She reports she still feels her normal self.  Blood pressure is in the 80s  systolic.  She reports its like that at home all the time.  Patient reports that she feels safe going home.  Will walk patient and ensure she does not feel dizzy or have near syncope.  Patient reports that she would prefer to go home and go to dialysis tomorrow and then needs to go to her granddaughter's funeral.  6:36 PM Patient ambulated here without difficulty and no recurrent symptoms.  Feel that patient is stable for discharge home.  Recommended holding metoprolol as long as her heart rate remains below 100 until she talks with her cardiologist.  Encouraged to take midodrine tomorrow prior to dialysis like normal.  Returning for recurrent symptoms.  Also discussed with's with patient's daughter Chong Sicilian who is her POA and they are comfortable with this plan.  MDM Number of Diagnoses or Management Options   Amount and/or Complexity of Data Reviewed Clinical lab tests: ordered and reviewed Tests in the radiology  section of CPT: ordered and reviewed Tests in the medicine section of CPT: ordered and reviewed Decide to obtain previous medical records or to obtain history from someone other than the patient: yes Obtain history from someone other than the patient: yes Review and summarize past medical records: yes Discuss the patient with other providers: no Independent visualization of images, tracings, or specimens: yes  Risk of Complications, Morbidity, and/or Mortality Presenting problems: moderate Diagnostic procedures: minimal Management options: minimal  Patient Progress Patient progress: improved    Final Clinical Impression(s) / ED Diagnoses Final diagnoses:  Near syncope  Bradycardia  Hypotension, unspecified hypotension type    Rx / DC Orders ED Discharge Orders    None       Blanchie Dessert, MD 09/05/20 1841

## 2020-09-05 NOTE — ED Triage Notes (Signed)
Patient arrives to ED via [facility] with complaints of bradycardia. Per EMS pts pulse was 48 at home, and PCP sent pt to ER. Pt states she has no complaints. But EMS found heart rate in mid 40's and BP 37'C systolic.

## 2020-09-05 NOTE — Telephone Encounter (Signed)
[name], NP notified of events; she advised pt be seen in ED for Evaluation; will notfiy pt's daughter and Barbaraann Share from [facility]

## 2020-09-05 NOTE — Discharge Instructions (Signed)
All the blood work looks good today with no new changes.  X-ray still shows some fluid on the right side of the lung but nothing evidently worse.  CAT scan of the brain was normal.  Heart rate improved here and is now in the 80s.  Blood pressure has remained in the 80s.  It would be important to talk with your cardiologist if you should continue the metoprolol.  Hold the metoprolol until you talk with them unless heart rate tomorrow is more than 100 then take your normal dose.  Otherwise it is okay to hold until you talk with them.  Also plan on taking the midodrine before you go to dialysis like usual.  If you have any recurrent symptoms like you had today or have any episodes of passing out, chest pain or severe shortness of breath return to the ER immediately.

## 2020-09-06 ENCOUNTER — Telehealth (HOSPITAL_COMMUNITY): Payer: Self-pay | Admitting: *Deleted

## 2020-09-06 NOTE — Telephone Encounter (Signed)
Pts daughter [name] left VM stating pt was seen in the ED for blurred vision and was told to hold metoprolol but was asked to contact Dr.McLean to see if she needed to stop metoprolol completely or when does she need to restart. Per Amy Clegg,NP-c note on 08/31/20 patient was to stop metoprolol. Called daughter she is aware and verbalized understanding.

## 2020-09-12 NOTE — Telephone Encounter (Signed)
Patient needs a cpap titration but could not complete the procedure and had to leave the sleep lab. Titration has been rescheduled but patient's daughter says her mom will not be able to complete the test. Message sent to dr Radford Pax for advisement.

## 2020-09-15 ENCOUNTER — Telehealth: Payer: Self-pay

## 2020-09-15 DIAGNOSIS — G4730 Sleep apnea, unspecified: Secondary | ICD-10-CM

## 2020-09-15 NOTE — Telephone Encounter (Signed)
-----   Message from Freada Bergeron, [place] sent at 09/15/2020  3:37 PM EDT ----- Regarding: FW: cpap titration Refer to ENT.  ----- Message ----- From: Sueanne Margarita, MD Sent: 09/13/2020   4:22 PM EDT To: Freada Bergeron, CMA Subject: RE: cpap titration                             Please refer to Dr. Redmond Baseman with ENT for evaluation for possible Inspire device ----- Message ----- From: [name], MD Sent: 09/13/2020   3:54 PM EDT To: Sueanne Margarita, MD Subject: RE: cpap titration                             Yes thanks.  ----- Message ----- From: Sueanne Margarita, MD Sent: 09/13/2020   1:58 PM EDT To: [name], MD, Freada Bergeron, CMA Subject: RE: cpap titration                             [place],   Are you ok with me referring her to ENT to consider hypoglossal nerve stimulator - I do not think she is going to tolerate PAP therapy  Traci ----- Message ----- From: Freada Bergeron, CMA Sent: 09/13/2020   8:59 AM EDT To: Sueanne Margarita, MD Subject: RE: cpap titration                             Lynnae Sandhoff said she could not tolerate any mask on her face. She acted claustrophobic and very anxious. ----- Message ----- From: Sueanne Margarita, MD Sent: 09/12/2020   9:36 PM EDT To: Freada Bergeron, CMA Subject: RE: cpap titration                             What happened that her mom had to stop the test ----- Message ----- From: Freada Bergeron, CMA Sent: 09/12/2020   5:15 PM EDT To: Sueanne Margarita, MD Subject: cpap titration                                 Patient needs a cpap titration but could not complete the procedure and had to leave the sleep lab. Titration has been rescheduled but patient's daughter says her mom will not be able to complete the test. Please advise on how I should proceed.  Thanks, Gae Bon

## 2020-09-18 NOTE — Telephone Encounter (Signed)
Spoke with the patient's daughter in regards to ENT referral. Daughter states that she is unsure if the patient will want to go through with an Inspire device. She has refused to get a pacemaker in the past. She is going to talk with the patient tonight and call us back tomorrow in regards to seeing ENT.

## 2020-09-19 ENCOUNTER — Telehealth: Payer: Self-pay | Admitting: *Deleted

## 2020-09-19 NOTE — Telephone Encounter (Signed)
-----   Message from Antonieta Iba, RN sent at 09/15/2020  3:51 PM EDT ----- Regarding: RE: cpap titration done ----- Message ----- From: Freada Bergeron, CMA Sent: 09/15/2020   3:37 PM EDT To: Antonieta Iba, RN Subject: FW: cpap titration                             Refer to ENT.  ----- Message ----- From: Sueanne Margarita, MD Sent: 09/13/2020   4:22 PM EDT To: Freada Bergeron, CMA Subject: RE: cpap titration                             Please refer to Dr. Redmond Baseman with ENT for evaluation for possible Inspire device ----- Message ----- From: [name], MD Sent: 09/13/2020   3:54 PM EDT To: Sueanne Margarita, MD Subject: RE: cpap titration                             Yes thanks.  ----- Message ----- From: Sueanne Margarita, MD Sent: 09/13/2020   1:58 PM EDT To: [name], MD, Freada Bergeron, CMA Subject: RE: cpap titration                             [place],   Are you ok with me referring her to ENT to consider hypoglossal nerve stimulator - I do not think she is going to tolerate PAP therapy  Traci ----- Message ----- From: Freada Bergeron, CMA Sent: 09/13/2020   8:59 AM EDT To: Sueanne Margarita, MD Subject: RE: cpap titration                             Lynnae Sandhoff said she could not tolerate any mask on her face. She acted claustrophobic and very anxious. ----- Message ----- From: Sueanne Margarita, MD Sent: 09/12/2020   9:36 PM EDT To: Freada Bergeron, CMA Subject: RE: cpap titration                             What happened that her mom had to stop the test ----- Message ----- From: Freada Bergeron, CMA Sent: 09/12/2020   5:15 PM EDT To: Sueanne Margarita, MD Subject: cpap titration                                 Patient needs a cpap titration but could not complete the procedure and had to leave the sleep lab. Titration has been rescheduled but patient's daughter says her mom will not be able to complete the test. Please advise on how I should  proceed.  Thanks, Gae Bon

## 2020-09-19 NOTE — Telephone Encounter (Signed)
Patient's daughter calling to let Percival Spanish know that the patient decided against Inspire device. She is waiting to find out what their next steps are now.

## 2020-09-26 NOTE — Telephone Encounter (Signed)
I did not order the CPAP titration - see note attached to this - I said to cancel titration and refer to ENT.  If she does not want to go to ENT I have nothing else to offer excetp referral to Dr. Toy Cookey for oral device

## 2020-09-27 NOTE — Telephone Encounter (Signed)
Daughter states the patient will likely decline to wear an oral appliance as well. Daughter understands Dr Radford Pax has nothing else to offer since she has declined all of her recommendations.

## 2020-10-01 ENCOUNTER — Encounter (HOSPITAL_BASED_OUTPATIENT_CLINIC_OR_DEPARTMENT_OTHER): Payer: Medicare HMO | Admitting: Cardiology

## 2020-10-05 ENCOUNTER — Ambulatory Visit: Payer: Medicare HMO | Admitting: Gastroenterology

## 2020-10-10 ENCOUNTER — Ambulatory Visit: Payer: Medicare HMO | Admitting: Gastroenterology

## 2020-10-12 ENCOUNTER — Other Ambulatory Visit: Payer: Self-pay

## 2020-10-12 ENCOUNTER — Telehealth: Payer: Self-pay | Admitting: *Deleted

## 2020-10-12 ENCOUNTER — Encounter: Payer: Self-pay | Admitting: Pulmonary Disease

## 2020-10-12 ENCOUNTER — Ambulatory Visit (INDEPENDENT_AMBULATORY_CARE_PROVIDER_SITE_OTHER): Payer: Medicare HMO

## 2020-10-12 ENCOUNTER — Ambulatory Visit (INDEPENDENT_AMBULATORY_CARE_PROVIDER_SITE_OTHER): Payer: Medicare HMO | Admitting: Pulmonary Disease

## 2020-10-12 VITALS — BP 128/62 | HR 83 | Temp 97.6°F | Ht 63.0 in | Wt 208.6 lb

## 2020-10-12 DIAGNOSIS — R0600 Dyspnea, unspecified: Secondary | ICD-10-CM | POA: Diagnosis not present

## 2020-10-12 DIAGNOSIS — J90 Pleural effusion, not elsewhere classified: Secondary | ICD-10-CM

## 2020-10-12 DIAGNOSIS — R0609 Other forms of dyspnea: Secondary | ICD-10-CM

## 2020-10-12 NOTE — Telephone Encounter (Signed)
Called and spoke with Patient's Daughter, [name], and Belenda Cruise, son in law ([place]). They are both aware of thoracentesis schedule.  Both stated understanding with 2:15 procedure time and arrive at [facility] main entrance by 1pm. Per Dr. Silas Flood, Patient can eat and drink before procedure.  Patient is aware she will not be NPO. Advised to call office with any questions or concerns before procedure.

## 2020-10-12 NOTE — Telephone Encounter (Signed)
-----   Message from [name], MD sent at 10/12/2020  3:30 PM EST ----- Please schedule Thoracentesis in procedure room in Endoscopy at [facility] 10/17/2020 - 9am with Dr. Silas Flood

## 2020-10-12 NOTE — Patient Instructions (Addendum)
Nice to meet you!  We will get a chest xray today.  We will schedule a thoracentesis at [facility] Tuesday 11/23, tentatively at 9 am.    Take your "last" dose of Apixaban (Eliquis) Saturday night - DO NOT TAKE SUNDAY OR MONDAY  Follow up as needed and we can schedule additional procedures as needed

## 2020-10-12 NOTE — H&P (View-Only) (Signed)
@Patient  ID: Melissa Adams, female    DOB: 1946-06-14, 74 y.o.   MRN: [id number]  Chief Complaint  Patient presents with  . Consult    referred for pleural effusion, fluid removed x's 2, DOE, non productive cough at times    Referring provider: Corliss Parish, MD  HPI:   74 year old woman with ESRD on HD, chronic congestive heart failure whom are seen at the request of Vanetta Mulders, MD for evaluation of pleural effusion.  Notes from referring provider reviewed.  Cardiology notes reviewed.  Notably, patient has CHF with reduced EF as well as valvular abnormalities.  She is became ESRD and started dialysis earlier in 2021.  Review of multiple chest x-rays revealed on my interpretation chronic small pleural effusions for many months with sudden enlargement June 2021.  Several subsequent chest x-rays have demonstrated the pleural effusion has developed loculation, not free-flowing.  She has undergone what appears to be 3 she reports 2 thoracenteses over the last several months.  First 2 studies were consistent with exudate via protein and LDH, mixed cell percentage relatively evenly dispersed between monocytes, neutrophils, lymphocytes.  She says that after each one she does get relief in terms of her shortness of breath.  Her dyspnea exertion gets better, she can do more.  She knows when the fluid comes back because her dyspnea exertion gets worse.  This is worsened over the last few weeks.  Notably, last thoracentesis was late September 2021.  She has little bit of a cough, nonproductive.  She notes that her weight is down about 6 pounds from a couple months ago.  She reports that her dry weight after dialysis is 94 kg.  She often goes in around 96 or little bit above 96 kg.  Sometimes her dialysis is limited by low blood pressure.  It seems like she feels fine and her standing blood pressure is okay and adequate prior to starting treatment.  Then when sitting in a chair earlier  during treatment the blood pressure seems to lower and sometimes seems lower during the treatment.  She has been told to bring an extra midodrine to dialysis sessions.  PMH: HTN, ESRD on HD, CHF Surgical history: AV fistula, hysterectomy Family History: CAD in father, HTN, lupus in sister Social History: Never smoker, lives in [place], moved to area 2019 or 2020  Questionaires / Pulmonary Flowsheets:   ACT:  No flowsheet data found.  MMRC: No flowsheet data found.  Epworth:  No flowsheet data found.  Tests:   FENO:  No results found for: NITRICOXIDE  PFT: No flowsheet data found.  WALK:  No flowsheet data found.  Imaging: Personally Reviewed and as per EMR  Lab Results: Personally reviewed and as per EMR CBC    Component Value Date/Time   WBC 6.1 09/05/2020 1517   RBC 3.75 (L) 09/05/2020 1517   HGB 11.7 (L) 09/05/2020 1517   HGB 11.4 06/22/2020 1126   HCT 38.5 09/05/2020 1517   HCT 35.1 06/22/2020 1126   PLT 128 (L) 09/05/2020 1517   PLT 141 (L) 06/22/2020 1126   MCV 102.7 (H) 09/05/2020 1517   MCV 96 06/22/2020 1126   MCH 31.2 09/05/2020 1517   MCHC 30.4 09/05/2020 1517   RDW 16.5 (H) 09/05/2020 1517   RDW 14.9 06/22/2020 1126   LYMPHSABS 1.3 08/31/2020 1637   LYMPHSABS 1.4 06/22/2020 1126   MONOABS 1.0 08/31/2020 1637   EOSABS 0.0 08/31/2020 1637   EOSABS 0.0 06/22/2020 1126   BASOSABS  0.0 08/31/2020 1637   BASOSABS 0.0 06/22/2020 1126    BMET    Component Value Date/Time   NA 137 09/05/2020 1517   NA 141 06/22/2020 1126   K 3.8 09/05/2020 1517   CL 95 (L) 09/05/2020 1517   CO2 28 09/05/2020 1517   GLUCOSE 87 09/05/2020 1517   BUN 32 (H) 09/05/2020 1517   BUN 28 (H) 06/22/2020 1126   CREATININE 6.88 (H) 09/05/2020 1517   CALCIUM 7.3 (L) 09/05/2020 1517   GFRNONAA 5 (L) 09/05/2020 1517   GFRAA 9 (L) 07/01/2020 1735    BNP    Component Value Date/Time   BNP 3,465.4 (H) 06/10/2020 1638    ProBNP No results found for: PROBNP  Specialty  Problems      Pulmonary Problems   Sleep apnea   Dyspnea   Pleural effusion      Allergies  Allergen Reactions  . Black Walnut Flavor Anaphylaxis  . Shellfish Allergy Itching    Makes throat itch     Immunization History  Administered Date(s) Administered  . Fluad Quad(high Dose 65+) 08/12/2020  . Moderna SARS-COVID-2 Vaccination 12/28/2019, 01/25/2020  . Pneumococcal Conjugate-13 06/22/2020  . Tdap 06/22/2020    Past Medical History:  Diagnosis Date  . A-fib ([place])   . Allergies   . Arthritis   . Asthma   . Cardiomyopathy ([place])   . CHF (congestive heart failure) ([place])   . Chronic kidney disease   . Edema   . History of hemodialysis   . Hyperlipidemia   . Hypertension   . Pulmonary hypertension ([place])   . Sleep apnea   . Tricuspid regurgitation     Tobacco History: Social History   Tobacco Use  Smoking Status Former Smoker  . Types: Cigarettes  Smokeless Tobacco Never Used   Counseling given: Not Answered   Continue to not smoke  Outpatient Encounter Medications as of 10/12/2020  Medication Sig  . acetaminophen (TYLENOL) 325 MG tablet Take 2 tablets (650 mg total) by mouth every 4 (four) hours as needed for headache or mild pain.  Marland Kitchen albuterol (VENTOLIN HFA) 108 (90 Base) MCG/ACT inhaler Inhale 2 puffs into the lungs every 6 (six) hours as needed for wheezing or shortness of breath.  . allopurinol (ZYLOPRIM) 300 MG tablet Take 1 tablet (300 mg total) by mouth at bedtime.  Marland Kitchen apixaban (ELIQUIS) 5 MG TABS tablet Take 1 tablet (5 mg total) by mouth 2 (two) times daily.  Marland Kitchen atorvastatin (LIPITOR) 80 MG tablet Take 1 tablet (80 mg total) by mouth at bedtime.  . cetirizine (ZYRTEC) 10 MG tablet Take 10 mg by mouth daily as needed for allergies.  . cinacalcet (SENSIPAR) 30 MG tablet Take 30 mg by mouth daily with supper.   . ferric citrate (AURYXIA) 1 GM 210 MG(Fe) tablet Take 210 mg by mouth 3 (three) times daily with meals.  . furosemide (LASIX) 40 MG tablet  Take 80 mg by mouth See admin instructions. Take 2 tablets (80 mg) by mouth twice daily on non-dialysis days - Sunday, Tuesday, Thursday  . lidocaine-prilocaine (EMLA) cream Apply 1 application topically See admin instructions. Apply topically to port access one hour prior to dialysis - Monday, Wednesday, Friday, Saturday  . metoprolol succinate (TOPROL-XL) 25 MG 24 hr tablet Take 1.5 tablets (37.5 mg total) by mouth 2 (two) times daily.  . midodrine (PROAMATINE) 10 MG tablet Take 10 mg by mouth See admin instructions. Take one tablet (10 mg) by mouth on Monday, Wednesday, Friday,  Saturday prior to dialysis  . montelukast (SINGULAIR) 10 MG tablet Take 1 tablet by mouth once daily  . multivitamin (RENA-VIT) TABS tablet Take 1 tablet by mouth daily.  . vitamin B-12 (CYANOCOBALAMIN) 1000 MCG tablet Take 1,000 mcg by mouth daily.  . vitamin C (VITAMIN C) 500 MG tablet Take 1 tablet (500 mg total) by mouth daily.   No facility-administered encounter medications on file as of 10/12/2020.     Review of Systems  Review of Systems  No chest pain with exertion. She denies any orthopnea or PND. Comprehensive review of systems otherwise negative.  Physical Exam  BP 128/62 (BP Location: Right Arm, Cuff Size: Large)   Pulse 83   Temp 97.6 F (36.4 C) (Skin)   Ht 5\' 3"  (1.6 m)   Wt 208 lb 9.6 oz (94.6 kg)   SpO2 99%   BMI 36.95 kg/m   Wt Readings from Last 5 Encounters:  10/12/20 208 lb 9.6 oz (94.6 kg)  08/31/20 213 lb 6.4 oz (96.8 kg)  08/15/20 213 lb 12.8 oz (97 kg)  06/22/20 (!) 222 lb (100.7 kg)  06/14/20 220 lb 0.3 oz (99.8 kg)    BMI Readings from Last 5 Encounters:  10/12/20 36.95 kg/m  08/31/20 37.80 kg/m  08/15/20 37.87 kg/m  07/01/20 39.33 kg/m  06/22/20 39.33 kg/m     Physical Exam General: Chronically ill-appearing, sitting up in chair Eyes: EOMI, no icterus Neck: Supple, no JVP appreciated Respiratory: Absent breath sounds right base, otherwise clear to  auscultation bilaterally Cardiovascular: Irregularly irregular, regular rate Abdomen: Nondistended, bowel sounds present MSK: No synovitis, no joint fusion Neuro: No weakness, normal gait Psych: Normal mood, full affect   Assessment & Plan:   Recurrent pleural effusion: Right side, most likely related to volume overload from CHF, inability to maintain euvolemia in setting of ESRD, and 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 Prior fluid studies consistent with exudate. Suspect this is most likely due to the chronicity of the effusion which is caused inflammation of the pleura. She reports improvement in dyspnea on exertion after prior thoracentesis. Discussed long-term solution will be continued fluid challenge with decreasing weights and dialysis. Admittedly her weight is down 8 pounds over the last couple months. This may need to be further challenged. Plan to pursue thoracentesis next week to see if she continues to receive therapeutic benefit. Discussed potential development of lung entrapment at some point which would limit future benefit of thoracentesis. She was instructed to hold apixaban 48 hours prior to procedure.  Dyspnea: Likely multifactorial related to chronic volume overload, CHF and lack of compensatory cardiac output with exertion, pleural effusion.  Return if symptoms worsen or fail to improve.   [name], MD 10/12/2020

## 2020-10-12 NOTE — Progress Notes (Signed)
@Patient  ID: Melissa Adams, female    DOB: 20-Feb-1946, 74 y.o.   MRN: [id number]  Chief Complaint  Patient presents with  . Consult    referred for pleural effusion, fluid removed x's 2, DOE, non productive cough at times    Referring provider: Corliss Parish, MD  HPI:   74 year old woman with ESRD on HD, chronic congestive heart failure whom are seen at the request of Vanetta Mulders, MD for evaluation of pleural effusion.  Notes from referring provider reviewed.  Cardiology notes reviewed.  Notably, patient has CHF with reduced EF as well as valvular abnormalities.  She is became ESRD and started dialysis earlier in 2021.  Review of multiple chest x-rays revealed on my interpretation chronic small pleural effusions for many months with sudden enlargement June 2021.  Several subsequent chest x-rays have demonstrated the pleural effusion has developed loculation, not free-flowing.  She has undergone what appears to be 3 she reports 2 thoracenteses over the last several months.  First 2 studies were consistent with exudate via protein and LDH, mixed cell percentage relatively evenly dispersed between monocytes, neutrophils, lymphocytes.  She says that after each one she does get relief in terms of her shortness of breath.  Her dyspnea exertion gets better, she can do more.  She knows when the fluid comes back because her dyspnea exertion gets worse.  This is worsened over the last few weeks.  Notably, last thoracentesis was late September 2021.  She has little bit of a cough, nonproductive.  She notes that her weight is down about 6 pounds from a couple months ago.  She reports that her dry weight after dialysis is 94 kg.  She often goes in around 96 or little bit above 96 kg.  Sometimes her dialysis is limited by low blood pressure.  It seems like she feels fine and her standing blood pressure is okay and adequate prior to starting treatment.  Then when sitting in a chair earlier  during treatment the blood pressure seems to lower and sometimes seems lower during the treatment.  She has been told to bring an extra midodrine to dialysis sessions.  PMH: HTN, ESRD on HD, CHF Surgical history: AV fistula, hysterectomy Family History: CAD in father, HTN, lupus in sister Social History: Never smoker, lives in [place], moved to area 2019 or 2020  Questionaires / Pulmonary Flowsheets:   ACT:  No flowsheet data found.  MMRC: No flowsheet data found.  Epworth:  No flowsheet data found.  Tests:   FENO:  No results found for: NITRICOXIDE  PFT: No flowsheet data found.  WALK:  No flowsheet data found.  Imaging: Personally Reviewed and as per EMR  Lab Results: Personally reviewed and as per EMR CBC    Component Value Date/Time   WBC 6.1 09/05/2020 1517   RBC 3.75 (L) 09/05/2020 1517   HGB 11.7 (L) 09/05/2020 1517   HGB 11.4 06/22/2020 1126   HCT 38.5 09/05/2020 1517   HCT 35.1 06/22/2020 1126   PLT 128 (L) 09/05/2020 1517   PLT 141 (L) 06/22/2020 1126   MCV 102.7 (H) 09/05/2020 1517   MCV 96 06/22/2020 1126   MCH 31.2 09/05/2020 1517   MCHC 30.4 09/05/2020 1517   RDW 16.5 (H) 09/05/2020 1517   RDW 14.9 06/22/2020 1126   LYMPHSABS 1.3 08/31/2020 1637   LYMPHSABS 1.4 06/22/2020 1126   MONOABS 1.0 08/31/2020 1637   EOSABS 0.0 08/31/2020 1637   EOSABS 0.0 06/22/2020 1126   BASOSABS  0.0 08/31/2020 1637   BASOSABS 0.0 06/22/2020 1126    BMET    Component Value Date/Time   NA 137 09/05/2020 1517   NA 141 06/22/2020 1126   K 3.8 09/05/2020 1517   CL 95 (L) 09/05/2020 1517   CO2 28 09/05/2020 1517   GLUCOSE 87 09/05/2020 1517   BUN 32 (H) 09/05/2020 1517   BUN 28 (H) 06/22/2020 1126   CREATININE 6.88 (H) 09/05/2020 1517   CALCIUM 7.3 (L) 09/05/2020 1517   GFRNONAA 5 (L) 09/05/2020 1517   GFRAA 9 (L) 07/01/2020 1735    BNP    Component Value Date/Time   BNP 3,465.4 (H) 06/10/2020 1638    ProBNP No results found for: PROBNP  Specialty  Problems      Pulmonary Problems   Sleep apnea   Dyspnea   Pleural effusion      Allergies  Allergen Reactions  . Black Walnut Flavor Anaphylaxis  . Shellfish Allergy Itching    Makes throat itch     Immunization History  Administered Date(s) Administered  . Fluad Quad(high Dose 65+) 08/12/2020  . Moderna SARS-COVID-2 Vaccination 12/28/2019, 01/25/2020  . Pneumococcal Conjugate-13 06/22/2020  . Tdap 06/22/2020    Past Medical History:  Diagnosis Date  . A-fib ([place])   . Allergies   . Arthritis   . Asthma   . Cardiomyopathy ([place])   . CHF (congestive heart failure) ([place])   . Chronic kidney disease   . Edema   . History of hemodialysis   . Hyperlipidemia   . Hypertension   . Pulmonary hypertension ([place])   . Sleep apnea   . Tricuspid regurgitation     Tobacco History: Social History   Tobacco Use  Smoking Status Former Smoker  . Types: Cigarettes  Smokeless Tobacco Never Used   Counseling given: Not Answered   Continue to not smoke  Outpatient Encounter Medications as of 10/12/2020  Medication Sig  . acetaminophen (TYLENOL) 325 MG tablet Take 2 tablets (650 mg total) by mouth every 4 (four) hours as needed for headache or mild pain.  Marland Kitchen albuterol (VENTOLIN HFA) 108 (90 Base) MCG/ACT inhaler Inhale 2 puffs into the lungs every 6 (six) hours as needed for wheezing or shortness of breath.  . allopurinol (ZYLOPRIM) 300 MG tablet Take 1 tablet (300 mg total) by mouth at bedtime.  Marland Kitchen apixaban (ELIQUIS) 5 MG TABS tablet Take 1 tablet (5 mg total) by mouth 2 (two) times daily.  Marland Kitchen atorvastatin (LIPITOR) 80 MG tablet Take 1 tablet (80 mg total) by mouth at bedtime.  . cetirizine (ZYRTEC) 10 MG tablet Take 10 mg by mouth daily as needed for allergies.  . cinacalcet (SENSIPAR) 30 MG tablet Take 30 mg by mouth daily with supper.   . ferric citrate (AURYXIA) 1 GM 210 MG(Fe) tablet Take 210 mg by mouth 3 (three) times daily with meals.  . furosemide (LASIX) 40 MG tablet  Take 80 mg by mouth See admin instructions. Take 2 tablets (80 mg) by mouth twice daily on non-dialysis days - Sunday, Tuesday, Thursday  . lidocaine-prilocaine (EMLA) cream Apply 1 application topically See admin instructions. Apply topically to port access one hour prior to dialysis - Monday, Wednesday, Friday, Saturday  . metoprolol succinate (TOPROL-XL) 25 MG 24 hr tablet Take 1.5 tablets (37.5 mg total) by mouth 2 (two) times daily.  . midodrine (PROAMATINE) 10 MG tablet Take 10 mg by mouth See admin instructions. Take one tablet (10 mg) by mouth on Monday, Wednesday, Friday,  Saturday prior to dialysis  . montelukast (SINGULAIR) 10 MG tablet Take 1 tablet by mouth once daily  . multivitamin (RENA-VIT) TABS tablet Take 1 tablet by mouth daily.  . vitamin B-12 (CYANOCOBALAMIN) 1000 MCG tablet Take 1,000 mcg by mouth daily.  . vitamin C (VITAMIN C) 500 MG tablet Take 1 tablet (500 mg total) by mouth daily.   No facility-administered encounter medications on file as of 10/12/2020.     Review of Systems  Review of Systems  No chest pain with exertion. She denies any orthopnea or PND. Comprehensive review of systems otherwise negative.  Physical Exam  BP 128/62 (BP Location: Right Arm, Cuff Size: Large)   Pulse 83   Temp 97.6 F (36.4 C) (Skin)   Ht 5\' 3"  (1.6 m)   Wt 208 lb 9.6 oz (94.6 kg)   SpO2 99%   BMI 36.95 kg/m   Wt Readings from Last 5 Encounters:  10/12/20 208 lb 9.6 oz (94.6 kg)  08/31/20 213 lb 6.4 oz (96.8 kg)  08/15/20 213 lb 12.8 oz (97 kg)  06/22/20 (!) 222 lb (100.7 kg)  06/14/20 220 lb 0.3 oz (99.8 kg)    BMI Readings from Last 5 Encounters:  10/12/20 36.95 kg/m  08/31/20 37.80 kg/m  08/15/20 37.87 kg/m  07/01/20 39.33 kg/m  06/22/20 39.33 kg/m     Physical Exam General: Chronically ill-appearing, sitting up in chair Eyes: EOMI, no icterus Neck: Supple, no JVP appreciated Respiratory: Absent breath sounds right base, otherwise clear to  auscultation bilaterally Cardiovascular: Irregularly irregular, regular rate Abdomen: Nondistended, bowel sounds present MSK: No synovitis, no joint fusion Neuro: No weakness, normal gait Psych: Normal mood, full affect   Assessment & Plan:   Recurrent pleural effusion: Right side, most likely related to volume overload from CHF, inability to maintain euvolemia in setting of ESRD, and 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 Prior fluid studies consistent with exudate. Suspect this is most likely due to the chronicity of the effusion which is caused inflammation of the pleura. She reports improvement in dyspnea on exertion after prior thoracentesis. Discussed long-term solution will be continued fluid challenge with decreasing weights and dialysis. Admittedly her weight is down 8 pounds over the last couple months. This may need to be further challenged. Plan to pursue thoracentesis next week to see if she continues to receive therapeutic benefit. Discussed potential development of lung entrapment at some point which would limit future benefit of thoracentesis. She was instructed to hold apixaban 48 hours prior to procedure.  Dyspnea: Likely multifactorial related to chronic volume overload, CHF and lack of compensatory cardiac output with exertion, pleural effusion.  Return if symptoms worsen or fail to improve.   [name], MD 10/12/2020

## 2020-10-12 NOTE — Telephone Encounter (Signed)
Called central scheduling and patient is scheduled for thoracentesis for 10/17/20 at 2:15 in the afternoon. Patient is to be there 1. Can eat and drink before procedure.

## 2020-10-13 ENCOUNTER — Telehealth: Payer: Self-pay | Admitting: Pulmonary Disease

## 2020-10-13 NOTE — Telephone Encounter (Signed)
MH--pts daughter is wanting to know if the pt will still need to have her dialysis on 11/23 as she is scheduled to have the thoracentesis with you.  Thanks

## 2020-10-16 NOTE — Telephone Encounter (Signed)
No thoracentesis tomorrow, go to HD. Resume Eliquis. Will talk with colleagues about doing next week vs refer to IR. I will get back to you. Thanks!

## 2020-10-16 NOTE — Telephone Encounter (Signed)
Called and spoke to pt's daughter, [name]. Informed her of the recs per Dr. Silas Flood. She verbalized understanding and is aware we will call once a new appt needs to be made.    PCC's please advise: pt's thora needs to be canceled, it is scheduled for 11/23. Thanks.

## 2020-10-16 NOTE — Telephone Encounter (Signed)
I have called endo and cancelled this procedure

## 2020-10-16 NOTE — Telephone Encounter (Signed)
Called and spoke to pt's daughter, [name]. She states the pt's dialysis schedule was moved due to the upcoming holiday. Pt had dialysis on Sunday 11/21, pt is now schedule to have it again on Tuesday 11/23 and then Friday 11/26. Pt stopped her Eliquis on 11/20.   Dr. Silas Flood, please advise regarding the thoracentesis. Thank you.

## 2020-10-17 ENCOUNTER — Ambulatory Visit (HOSPITAL_COMMUNITY): Admission: RE | Admit: 2020-10-17 | Payer: Medicare HMO | Source: Home / Self Care | Admitting: Pulmonary Disease

## 2020-10-17 ENCOUNTER — Encounter (HOSPITAL_COMMUNITY): Admission: RE | Payer: Self-pay | Source: Home / Self Care

## 2020-10-17 SURGERY — THORACENTESIS
Anesthesia: Moderate Sedation

## 2020-10-24 ENCOUNTER — Ambulatory Visit (HOSPITAL_COMMUNITY)
Admission: RE | Admit: 2020-10-24 | Discharge: 2020-10-24 | Disposition: A | Discharge status: Home / Self Care | Payer: Medicare HMO | Source: Ambulatory Visit | Attending: Cardiology | Admitting: Cardiology

## 2020-10-24 ENCOUNTER — Other Ambulatory Visit: Payer: Self-pay

## 2020-10-24 DIAGNOSIS — E7800 Pure hypercholesterolemia, unspecified: Secondary | ICD-10-CM | POA: Diagnosis not present

## 2020-10-24 LAB — HEPATIC FUNCTION PANEL
ALT: 42 U/L (ref 0–44)
AST: 44 U/L — ABNORMAL HIGH (ref 15–41)
Albumin: 3.6 g/dL (ref 3.5–5.0)
Alkaline Phosphatase: 224 U/L — ABNORMAL HIGH (ref 38–126)
Bilirubin, Direct: 0.7 mg/dL — ABNORMAL HIGH (ref 0.0–0.2)
Indirect Bilirubin: 1.1 mg/dL — ABNORMAL HIGH (ref 0.3–0.9)
Total Bilirubin: 1.8 mg/dL — ABNORMAL HIGH (ref 0.3–1.2)
Total Protein: 7.1 g/dL (ref 6.5–8.1)

## 2020-10-24 LAB — LIPID PANEL
Cholesterol: 163 mg/dL (ref 0–200)
HDL: 60 mg/dL (ref >40)
LDL Cholesterol: 93 mg/dL (ref 0–99)
Total CHOL/HDL Ratio: 2.7 RATIO
Triglycerides: 51 mg/dL (ref <150)
VLDL: 10 mg/dL (ref 0–40)

## 2020-11-01 ENCOUNTER — Telehealth: Payer: Self-pay | Admitting: Pulmonary Disease

## 2020-11-01 NOTE — Telephone Encounter (Signed)
Called and spoke with pt's daughter [name] who was checking to see when pt's thoracentesis could be rescheduled for. Pt was supposed to have had the thoracentesis 11/23 as she usually does dialysis Mon, Wed, Fri but due to the Thanksgiving holiday pt was scheduled to have dialysis the day of the thoracentesis so the thoracentesis was cancelled due to Dr. Silas Flood saying for pt to go to her dialysis.  Since this has been going on since September 05, 2020 when pt first went to the ED and pt still has not been able to have procedure performed yet, they were checking to see if/when it might be able to happen.  [name] states that pt is not having any worsening SOB, states that she is able to take a deep breath but when doing so, pt begins to cough more.  Pt is currently scheduled for a consult appt with Dr. Erin Fulling 01/02/21 due to recurrent pleural effusion (appt was scheduled today, 12/8) but since pt is already established with Dr. Silas Flood, sending this to him.  Dr. Silas Flood, please advise on this for pt and daughter [name].

## 2020-11-02 NOTE — Telephone Encounter (Signed)
I am available to do procedure at [facility] either Tuesday 12/14 or Thursday 12/16. If 12/14, the last dose of blood thinner Saturday night 12/11. If 12/16, the last dose of blood thinner Monday 12/13 night. Thanks!

## 2020-11-02 NOTE — Telephone Encounter (Signed)
Called and spoke with patient. Let them know their Thoracentesis is scheduled for 11/07/20 with Dr. Silas Flood at 2pm.  Patient was instructed to arrive at hospital at before 1pm. They were instructed to bring someone with them as they will not be able to drive home from procedure. Patient instructed not to have anything to eat or drink after midnight. Patient needs to hold their blood thinner 3 days prior to procedure.   Patient's covid screening is scheduled at [facility] for 11/04/20 at 1015.  Patient voiced understanding, nothing further needed  Routing to [place] as FYI

## 2020-11-02 NOTE — Telephone Encounter (Signed)
I am routing this to the procedure pool so we can try to see when pt could get scheduled at [facility] for the thoracentesis. Please advise.

## 2020-11-04 ENCOUNTER — Other Ambulatory Visit (HOSPITAL_COMMUNITY)
Admission: RE | Admit: 2020-11-04 | Discharge: 2020-11-04 | Disposition: A | Discharge status: Home / Self Care | Payer: Medicare HMO | Source: Ambulatory Visit | Attending: Pulmonary Disease | Admitting: Pulmonary Disease

## 2020-11-04 DIAGNOSIS — Z20822 Contact with and (suspected) exposure to covid-19: Secondary | ICD-10-CM | POA: Diagnosis not present

## 2020-11-04 DIAGNOSIS — Z01812 Encounter for preprocedural laboratory examination: Secondary | ICD-10-CM | POA: Diagnosis present

## 2020-11-05 LAB — SARS CORONAVIRUS 2 (TAT 6-24 HRS): SARS Coronavirus 2: NEGATIVE

## 2020-11-07 ENCOUNTER — Ambulatory Visit (HOSPITAL_COMMUNITY)
Admission: RE | Admit: 2020-11-07 | Discharge: 2020-11-07 | Disposition: A | Discharge status: Home / Self Care | Payer: Medicare HMO | Attending: Pulmonary Disease | Admitting: Pulmonary Disease

## 2020-11-07 ENCOUNTER — Encounter (HOSPITAL_COMMUNITY)
Admission: RE | Disposition: A | Discharge status: Home / Self Care | Payer: Self-pay | Source: Home / Self Care | Attending: Pulmonary Disease

## 2020-11-07 ENCOUNTER — Ambulatory Visit (HOSPITAL_COMMUNITY): Payer: Medicare HMO

## 2020-11-07 ENCOUNTER — Encounter (HOSPITAL_COMMUNITY): Payer: Self-pay | Admitting: Pulmonary Disease

## 2020-11-07 DIAGNOSIS — Z87891 Personal history of nicotine dependence: Secondary | ICD-10-CM | POA: Diagnosis not present

## 2020-11-07 DIAGNOSIS — Z79899 Other long term (current) drug therapy: Secondary | ICD-10-CM | POA: Diagnosis not present

## 2020-11-07 DIAGNOSIS — J90 Pleural effusion, not elsewhere classified: Secondary | ICD-10-CM

## 2020-11-07 DIAGNOSIS — Z7901 Long term (current) use of anticoagulants: Secondary | ICD-10-CM | POA: Diagnosis not present

## 2020-11-07 DIAGNOSIS — I5020 Unspecified systolic (congestive) heart failure: Secondary | ICD-10-CM | POA: Diagnosis not present

## 2020-11-07 DIAGNOSIS — Z91013 Allergy to seafood: Secondary | ICD-10-CM | POA: Diagnosis not present

## 2020-11-07 DIAGNOSIS — Z9889 Other specified postprocedural states: Secondary | ICD-10-CM

## 2020-11-07 DIAGNOSIS — N186 End stage renal disease: Secondary | ICD-10-CM | POA: Insufficient documentation

## 2020-11-07 DIAGNOSIS — Z992 Dependence on renal dialysis: Secondary | ICD-10-CM | POA: Insufficient documentation

## 2020-11-07 HISTORY — PX: THORACENTESIS: SHX235

## 2020-11-07 LAB — BODY FLUID CELL COUNT WITH DIFFERENTIAL
Eos, Fluid: 0 %
Lymphs, Fluid: 59 %
Monocyte-Macrophage-Serous Fluid: 35 % — ABNORMAL LOW (ref 50–90)
Neutrophil Count, Fluid: 6 % (ref 0–25)
Total Nucleated Cell Count, Fluid: 550 cu mm (ref 0–1000)

## 2020-11-07 LAB — PROTEIN, PLEURAL OR PERITONEAL FLUID: Total protein, fluid: 3.4 g/dL

## 2020-11-07 LAB — LACTATE DEHYDROGENASE, PLEURAL OR PERITONEAL FLUID: LD, Fluid: 141 U/L — ABNORMAL HIGH (ref 3–23)

## 2020-11-07 SURGERY — THORACENTESIS
Anesthesia: LOCAL

## 2020-11-07 NOTE — Op Note (Signed)
Thoracentesis  Procedure Note  [facility]  [id number]  Nov 10, 1946  Date:11/07/20  Time:3:09 PM   Provider Performing:Johnay Mano R Gracemarie Skeet   Procedure: Thoracentesis with imaging guidance (84720)  Indication(s) Pleural Effusion  Consent Risks of the procedure as well as the alternatives and risks of each were explained to the patient and/or caregiver.  Consent for the procedure was obtained and is signed in the bedside chart  Anesthesia Topical only with 1% lidocaine    Time Out Verified patient identification, verified procedure, site/side was marked, verified correct patient position, special equipment/implants available, medications/allergies/relevant history reviewed, required imaging and test results available.   Sterile Technique Maximal sterile technique including full sterile barrier drape, hand hygiene,sterile gloves, mask, hair covering, sterile ultrasound probe cover (if used).  Procedure Description Ultrasound was used to identify appropriate pleural anatomy for placement and overlying skin marked.  Area of drainage cleaned and draped in sterile fashion. Lidocaine was used to anesthetize the skin and subcutaneous tissue.  1400 cc's of serous to serosanguinous appearing fluid was drained from the right pleural space. Procedure terminated due to cessation of fluid flow.  Catheter then removed and bandaid applied to site.   Complications/Tolerance None; patient tolerated the procedure well. Chest X-ray is ordered to confirm no post-procedural complication.   EBL none   Specimen(s) Pleural fluid

## 2020-11-07 NOTE — Interval H&P Note (Signed)
History and Physical Interval Note:  11/07/2020 1:10 PM  [facility]  has presented today for surgery, with the diagnosis of pleural effusion.  The various methods of treatment have been discussed with the patient and family. After consideration of risks, benefits and other options for treatment, the patient has consented to  Procedure(s): THORACENTESIS (N/A) as a surgical intervention.  The patient's history has been reviewed, patient examined, no change in status, stable for surgery.  I have reviewed the patient's chart and labs.  Questions were answered to the patient's satisfaction.     Bonna Gains [facility]

## 2020-11-08 LAB — ALBUMIN, FLUID (OTHER): Albumin, Body Fluid Other: 2.2 g/dL

## 2020-11-09 LAB — CYTOLOGY - NON PAP

## 2020-11-10 NOTE — Progress Notes (Signed)
Results of thoracentesis very similar to prior results. If breathing has improved we can consider repeat procedure in the future. Thanks!

## 2020-11-10 NOTE — Progress Notes (Signed)
Called and spoke with Precious Bard, listed on DPR, provided results per Dr. Silas Flood.  She verbalized understanding.  Nothing further needed.

## 2020-11-13 ENCOUNTER — Other Ambulatory Visit: Payer: Self-pay

## 2020-11-13 ENCOUNTER — Ambulatory Visit: Payer: Self-pay

## 2020-11-13 ENCOUNTER — Inpatient Hospital Stay (HOSPITAL_COMMUNITY)
Admission: EM | Admit: 2020-11-13 | Discharge: 2020-11-19 | DRG: 377 | Disposition: A | Discharge status: Home / Self Care | Payer: Medicare HMO | Attending: Internal Medicine | Admitting: Internal Medicine

## 2020-11-13 ENCOUNTER — Inpatient Hospital Stay (HOSPITAL_COMMUNITY): Payer: Medicare HMO

## 2020-11-13 DIAGNOSIS — I5033 Acute on chronic diastolic (congestive) heart failure: Secondary | ICD-10-CM | POA: Diagnosis not present

## 2020-11-13 DIAGNOSIS — Z79899 Other long term (current) drug therapy: Secondary | ICD-10-CM

## 2020-11-13 DIAGNOSIS — I5023 Acute on chronic systolic (congestive) heart failure: Secondary | ICD-10-CM | POA: Diagnosis present

## 2020-11-13 DIAGNOSIS — K319 Disease of stomach and duodenum, unspecified: Secondary | ICD-10-CM | POA: Diagnosis present

## 2020-11-13 DIAGNOSIS — R571 Hypovolemic shock: Secondary | ICD-10-CM | POA: Diagnosis present

## 2020-11-13 DIAGNOSIS — J45909 Unspecified asthma, uncomplicated: Secondary | ICD-10-CM | POA: Diagnosis present

## 2020-11-13 DIAGNOSIS — I9589 Other hypotension: Secondary | ICD-10-CM | POA: Diagnosis not present

## 2020-11-13 DIAGNOSIS — K3189 Other diseases of stomach and duodenum: Secondary | ICD-10-CM | POA: Diagnosis not present

## 2020-11-13 DIAGNOSIS — I5022 Chronic systolic (congestive) heart failure: Secondary | ICD-10-CM | POA: Diagnosis present

## 2020-11-13 DIAGNOSIS — D126 Benign neoplasm of colon, unspecified: Secondary | ICD-10-CM

## 2020-11-13 DIAGNOSIS — K648 Other hemorrhoids: Secondary | ICD-10-CM | POA: Diagnosis present

## 2020-11-13 DIAGNOSIS — K7460 Unspecified cirrhosis of liver: Secondary | ICD-10-CM | POA: Diagnosis present

## 2020-11-13 DIAGNOSIS — E1122 Type 2 diabetes mellitus with diabetic chronic kidney disease: Secondary | ICD-10-CM | POA: Diagnosis present

## 2020-11-13 DIAGNOSIS — J918 Pleural effusion in other conditions classified elsewhere: Secondary | ICD-10-CM | POA: Diagnosis present

## 2020-11-13 DIAGNOSIS — Z91013 Allergy to seafood: Secondary | ICD-10-CM

## 2020-11-13 DIAGNOSIS — I482 Chronic atrial fibrillation, unspecified: Secondary | ICD-10-CM | POA: Diagnosis present

## 2020-11-13 DIAGNOSIS — M109 Gout, unspecified: Secondary | ICD-10-CM | POA: Diagnosis present

## 2020-11-13 DIAGNOSIS — I10 Essential (primary) hypertension: Secondary | ICD-10-CM | POA: Diagnosis not present

## 2020-11-13 DIAGNOSIS — I4821 Permanent atrial fibrillation: Secondary | ICD-10-CM | POA: Diagnosis present

## 2020-11-13 DIAGNOSIS — I132 Hypertensive heart and chronic kidney disease with heart failure and with stage 5 chronic kidney disease, or end stage renal disease: Secondary | ICD-10-CM | POA: Diagnosis present

## 2020-11-13 DIAGNOSIS — K922 Gastrointestinal hemorrhage, unspecified: Secondary | ICD-10-CM

## 2020-11-13 DIAGNOSIS — E785 Hyperlipidemia, unspecified: Secondary | ICD-10-CM | POA: Diagnosis present

## 2020-11-13 DIAGNOSIS — Z8249 Family history of ischemic heart disease and other diseases of the circulatory system: Secondary | ICD-10-CM

## 2020-11-13 DIAGNOSIS — G4730 Sleep apnea, unspecified: Secondary | ICD-10-CM | POA: Diagnosis present

## 2020-11-13 DIAGNOSIS — D638 Anemia in other chronic diseases classified elsewhere: Secondary | ICD-10-CM | POA: Diagnosis present

## 2020-11-13 DIAGNOSIS — D649 Anemia, unspecified: Secondary | ICD-10-CM

## 2020-11-13 DIAGNOSIS — T82898A Other specified complication of vascular prosthetic devices, implants and grafts, initial encounter: Secondary | ICD-10-CM | POA: Diagnosis not present

## 2020-11-13 DIAGNOSIS — R188 Other ascites: Secondary | ICD-10-CM | POA: Diagnosis not present

## 2020-11-13 DIAGNOSIS — I428 Other cardiomyopathies: Secondary | ICD-10-CM | POA: Diagnosis present

## 2020-11-13 DIAGNOSIS — J189 Pneumonia, unspecified organism: Secondary | ICD-10-CM

## 2020-11-13 DIAGNOSIS — I361 Nonrheumatic tricuspid (valve) insufficiency: Secondary | ICD-10-CM | POA: Diagnosis not present

## 2020-11-13 DIAGNOSIS — Z8616 Personal history of COVID-19: Secondary | ICD-10-CM

## 2020-11-13 DIAGNOSIS — K635 Polyp of colon: Secondary | ICD-10-CM | POA: Diagnosis present

## 2020-11-13 DIAGNOSIS — E876 Hypokalemia: Secondary | ICD-10-CM | POA: Diagnosis not present

## 2020-11-13 DIAGNOSIS — Z20822 Contact with and (suspected) exposure to covid-19: Secondary | ICD-10-CM | POA: Diagnosis present

## 2020-11-13 DIAGNOSIS — I340 Nonrheumatic mitral (valve) insufficiency: Secondary | ICD-10-CM | POA: Diagnosis not present

## 2020-11-13 DIAGNOSIS — D3A010 Benign carcinoid tumor of the duodenum: Secondary | ICD-10-CM

## 2020-11-13 DIAGNOSIS — J90 Pleural effusion, not elsewhere classified: Secondary | ICD-10-CM | POA: Diagnosis present

## 2020-11-13 DIAGNOSIS — M7989 Other specified soft tissue disorders: Secondary | ICD-10-CM | POA: Diagnosis present

## 2020-11-13 DIAGNOSIS — K317 Polyp of stomach and duodenum: Secondary | ICD-10-CM | POA: Diagnosis not present

## 2020-11-13 DIAGNOSIS — I272 Pulmonary hypertension, unspecified: Secondary | ICD-10-CM | POA: Diagnosis present

## 2020-11-13 DIAGNOSIS — R578 Other shock: Secondary | ICD-10-CM | POA: Diagnosis present

## 2020-11-13 DIAGNOSIS — E8889 Other specified metabolic disorders: Secondary | ICD-10-CM | POA: Diagnosis present

## 2020-11-13 DIAGNOSIS — Z7901 Long term (current) use of anticoagulants: Secondary | ICD-10-CM | POA: Diagnosis not present

## 2020-11-13 DIAGNOSIS — I4891 Unspecified atrial fibrillation: Secondary | ICD-10-CM | POA: Diagnosis not present

## 2020-11-13 DIAGNOSIS — I700 Atherosclerosis of aorta: Secondary | ICD-10-CM | POA: Diagnosis present

## 2020-11-13 DIAGNOSIS — E861 Hypovolemia: Secondary | ICD-10-CM | POA: Diagnosis present

## 2020-11-13 DIAGNOSIS — Z90710 Acquired absence of both cervix and uterus: Secondary | ICD-10-CM

## 2020-11-13 DIAGNOSIS — Z6838 Body mass index (BMI) 38.0-38.9, adult: Secondary | ICD-10-CM

## 2020-11-13 DIAGNOSIS — G4733 Obstructive sleep apnea (adult) (pediatric): Secondary | ICD-10-CM | POA: Diagnosis present

## 2020-11-13 DIAGNOSIS — N186 End stage renal disease: Secondary | ICD-10-CM | POA: Diagnosis not present

## 2020-11-13 DIAGNOSIS — Z87891 Personal history of nicotine dependence: Secondary | ICD-10-CM

## 2020-11-13 DIAGNOSIS — K921 Melena: Principal | ICD-10-CM | POA: Diagnosis present

## 2020-11-13 DIAGNOSIS — D62 Acute posthemorrhagic anemia: Secondary | ICD-10-CM | POA: Diagnosis present

## 2020-11-13 DIAGNOSIS — Z91018 Allergy to other foods: Secondary | ICD-10-CM

## 2020-11-13 DIAGNOSIS — I255 Ischemic cardiomyopathy: Secondary | ICD-10-CM | POA: Diagnosis present

## 2020-11-13 DIAGNOSIS — Z86010 Personal history of colonic polyps: Secondary | ICD-10-CM

## 2020-11-13 DIAGNOSIS — Z992 Dependence on renal dialysis: Secondary | ICD-10-CM

## 2020-11-13 DIAGNOSIS — D696 Thrombocytopenia, unspecified: Secondary | ICD-10-CM | POA: Diagnosis not present

## 2020-11-13 LAB — COMPREHENSIVE METABOLIC PANEL
ALT: 28 U/L (ref 0–44)
AST: 35 U/L (ref 15–41)
Albumin: 3.1 g/dL — ABNORMAL LOW (ref 3.5–5.0)
Alkaline Phosphatase: 115 U/L (ref 38–126)
Anion gap: 16 — ABNORMAL HIGH (ref 5–15)
BUN: 33 mg/dL — ABNORMAL HIGH (ref 8–23)
CO2: 30 mmol/L (ref 22–32)
Calcium: 7.7 mg/dL — ABNORMAL LOW (ref 8.9–10.3)
Chloride: 92 mmol/L — ABNORMAL LOW (ref 98–111)
Creatinine, Ser: 3.22 mg/dL — ABNORMAL HIGH (ref 0.44–1.00)
GFR, Estimated: 15 mL/min — ABNORMAL LOW (ref >60)
Glucose, Bld: 104 mg/dL — ABNORMAL HIGH (ref 70–99)
Potassium: 3.5 mmol/L (ref 3.5–5.1)
Sodium: 138 mmol/L (ref 135–145)
Total Bilirubin: 1.5 mg/dL — ABNORMAL HIGH (ref 0.3–1.2)
Total Protein: 5.8 g/dL — ABNORMAL LOW (ref 6.5–8.1)

## 2020-11-13 LAB — CBC
HCT: 17.2 % — ABNORMAL LOW (ref 36.0–46.0)
Hemoglobin: 5.7 g/dL — CL (ref 12.0–15.0)
MCH: 33.5 pg (ref 26.0–34.0)
MCHC: 33.1 g/dL (ref 30.0–36.0)
MCV: 101.2 fL — ABNORMAL HIGH (ref 80.0–100.0)
Platelets: 146 10*3/uL — ABNORMAL LOW (ref 150–400)
RBC: 1.7 MIL/uL — ABNORMAL LOW (ref 3.87–5.11)
RDW: 18.6 % — ABNORMAL HIGH (ref 11.5–15.5)
WBC: 7.9 10*3/uL (ref 4.0–10.5)
nRBC: 1.3 % — ABNORMAL HIGH (ref 0.0–0.2)

## 2020-11-13 LAB — RESP PANEL BY RT-PCR (FLU A&B, COVID) ARPGX2
Influenza A by PCR: NEGATIVE
Influenza B by PCR: NEGATIVE
SARS Coronavirus 2 by RT PCR: NEGATIVE

## 2020-11-13 LAB — PREPARE RBC (CROSSMATCH)

## 2020-11-13 LAB — POC OCCULT BLOOD, ED: Fecal Occult Bld: POSITIVE — AB

## 2020-11-13 MED ORDER — SODIUM CHLORIDE 0.9% FLUSH: 3.0000 mL | INTRAVENOUS | Status: DC | PRN

## 2020-11-13 MED ORDER — OCTREOTIDE LOAD VIA INFUSION
50.0000 ug | Freq: Once | INTRAVENOUS | Status: DC
  Filled 2020-11-13: qty 25

## 2020-11-13 MED ORDER — MIDODRINE HCL 5 MG PO TABS
10.0000 mg | ORAL_TABLET | ORAL | Status: DC
  Administered 2020-11-15 – 2020-11-17 (×3): 10 mg via ORAL
  Filled 2020-11-13 (×2): qty 2

## 2020-11-13 MED ORDER — ATORVASTATIN CALCIUM 10 MG PO TABS: 80.0000 mg | ORAL_TABLET | Freq: Every day | ORAL | Status: DC

## 2020-11-13 MED ORDER — SODIUM CHLORIDE 0.9 % IV SOLN: 250.0000 mL | INTRAVENOUS | Status: DC | PRN

## 2020-11-13 MED ORDER — SODIUM CHLORIDE 0.9 % IV SOLN: 10.0000 mL/h | Freq: Once | INTRAVENOUS | Status: DC

## 2020-11-13 MED ORDER — PROTHROMBIN COMPLEX CONC HUMAN 500 UNITS IV KIT
5370.0000 [IU] | PACK | Status: AC
  Administered 2020-11-13: 5370 [IU] via INTRAVENOUS
  Filled 2020-11-13: qty 5370

## 2020-11-13 MED ORDER — PANTOPRAZOLE SODIUM 40 MG IV SOLR
40.0000 mg | Freq: Once | INTRAVENOUS | Status: AC
  Administered 2020-11-13: 40 mg via INTRAVENOUS
  Filled 2020-11-13: qty 40

## 2020-11-13 MED ORDER — ACETAMINOPHEN 650 MG RE SUPP: 650.0000 mg | Freq: Four times a day (QID) | RECTAL | Status: DC | PRN

## 2020-11-13 MED ORDER — CINACALCET HCL 30 MG PO TABS
30.0000 mg | ORAL_TABLET | Freq: Every day | ORAL | Status: DC
  Administered 2020-11-16 – 2020-11-18 (×3): 30 mg via ORAL
  Filled 2020-11-13 (×3): qty 1

## 2020-11-13 MED ORDER — ALBUTEROL SULFATE HFA 108 (90 BASE) MCG/ACT IN AERS
2.0000 | INHALATION_SPRAY | Freq: Four times a day (QID) | RESPIRATORY_TRACT | Status: DC | PRN
  Filled 2020-11-13: qty 6.7

## 2020-11-13 MED ORDER — SODIUM CHLORIDE 0.9 % IV SOLN
50.0000 ug/h | INTRAVENOUS | Status: DC
  Filled 2020-11-13: qty 1

## 2020-11-13 MED ORDER — ACETAMINOPHEN 325 MG PO TABS
650.0000 mg | ORAL_TABLET | Freq: Four times a day (QID) | ORAL | Status: DC | PRN
  Administered 2020-11-17: 650 mg via ORAL
  Filled 2020-11-13: qty 2

## 2020-11-13 MED ORDER — SODIUM CHLORIDE 0.9 % IV SOLN
8.0000 mg/h | INTRAVENOUS | Status: DC
  Administered 2020-11-13: 8 mg/h via INTRAVENOUS
  Filled 2020-11-13 (×2): qty 80

## 2020-11-13 MED ORDER — MONTELUKAST SODIUM 10 MG PO TABS
10.0000 mg | ORAL_TABLET | Freq: Every day | ORAL | Status: DC
  Administered 2020-11-15 – 2020-11-19 (×4): 10 mg via ORAL
  Filled 2020-11-13 (×4): qty 1

## 2020-11-13 MED ORDER — MIDODRINE HCL 5 MG PO TABS: 10.0000 mg | ORAL_TABLET | ORAL | Status: DC

## 2020-11-13 MED ORDER — SODIUM CHLORIDE 0.9% FLUSH
3.0000 mL | Freq: Two times a day (BID) | INTRAVENOUS | Status: DC
  Administered 2020-11-15 – 2020-11-19 (×9): 3 mL via INTRAVENOUS

## 2020-11-13 NOTE — Telephone Encounter (Signed)
Sent to PCP just for FYI.

## 2020-11-13 NOTE — ED Provider Notes (Signed)
[facility] Provider Note   CSN: [id number] Arrival date & time: 11/13/20  1247     History Chief Complaint  Patient presents with  . Melena    Melissa [facility] is a 74 y.o. female past medical history of A. fib (on Eliquis), CHF, CKD, hyperlipidemia, hypertension who presents for evaluation of melena x3 days as well as fatigue.  Patient reports that about 3 days ago, she started noticing black tarry stools.  She states that is significantly gotten worse.  She has not had bleeding in between episodes of dark stools.  She is currently on Eliquis for treatment of A. fib.  She has been on it for about a year.  She states previously, she was on Coumadin.  She is a Monday Wednesday Friday dialysis patient and got dialysis today.  She states she has felt fatigued and felt like she had no energy.  She has also had some dyspnea on exertion which she states is Melissa for her.  She has had a little bit of abdominal pain feels like she has some epigastric discomfort.  She has not had any vomiting but states she has not felt like she has wanted to eat anything.  She denies any chest pain, fevers.  The history is provided by the patient.       Past Medical History:  Diagnosis Date  . A-fib ([facility])   . Allergies   . Arthritis   . Asthma   . Cardiomyopathy ([facility])   . CHF (congestive heart failure) ([name])   . Chronic kidney disease   . Edema   . History of hemodialysis   . Hyperlipidemia   . Hypertension   . Pulmonary hypertension ([facility])   . Sleep apnea   . Tricuspid regurgitation     Patient Active Problem List   Diagnosis Date Noted  . Pleural effusion 06/11/2020  . Aortic atherosclerosis ([name]) 06/11/2020  . 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 Acute on chronic systolic heart failure ([name])   . Pressure injury of skin 10/22/2019  . Dyspnea   . COVID-19 virus infection   . CHF exacerbation ([facility]) 10/12/2019  . Acute CHF (congestive heart failure) ([facility])  10/11/2019  . Atrial fibrillation, chronic ([facility]) 10/11/2019  . Essential hypertension 10/11/2019  . Sleep apnea 10/11/2019  . AKI (acute kidney injury) ([facility]) 10/11/2019  . Gout 10/11/2019  . Normocytic anemia 10/11/2019    Past Surgical History:  Procedure Laterality Date  . A/V FISTULAGRAM Left 05/23/2020   Procedure: A/V FISTULAGRAM;  Surgeon: [name], MD;  Location: [facility];  Service: Cardiovascular;  Laterality: Left;  . ABDOMINAL HYSTERECTOMY    . AV FISTULA PLACEMENT Left 11/04/2019   Procedure: Insertion Of Arteriovenous (Av) Gore-Tex Graft Arm;  Surgeon: [name], MD;  Location: [facility];  Service: Vascular;  Laterality: Left;  . CARDIAC CATHETERIZATION    . CARDIOVERSION N/A 10/26/2019   Procedure: CARDIOVERSION;  Surgeon: [name], MD;  Location: [facility];  Service: Cardiovascular;  Laterality: N/A;  . COLONOSCOPY W/ BIOPSIES AND POLYPECTOMY    . FISTULA SUPERFICIALIZATION Left 01/21/2020   Procedure: FISTULA SUPERFICIALIZATION OF FISTULA;  Surgeon: [name], MD;  Location: [facility];  Service: Vascular;  Laterality: Left;  . INSERTION OF DIALYSIS CATHETER Right 11/04/2019   Procedure: CONVERT TEMPORARY DIALYSIS CATHETER TO TUNNELED DIALYSIS CATHETER Right Internal Jugular.;  Surgeon: [name], MD;  Location: [facility];  Service: Vascular;  Laterality: Right;  .  IR THORACENTESIS ASP PLEURAL SPACE W/IMG GUIDE  08/17/2020  . MULTIPLE TOOTH EXTRACTIONS    . REVISION OF ARTERIOVENOUS GORETEX GRAFT Left 01/21/2020   Procedure: REVISION OF ARTERIOVENOUS FISTULA WITH SIDE BRANCH LIGATION;  Surgeon: [name], MD;  Location: [facility];  Service: Vascular;  Laterality: Left;  . TEE WITHOUT CARDIOVERSION N/A 10/26/2019   Procedure: TRANSESOPHAGEAL ECHOCARDIOGRAM (TEE);  Surgeon: [name], MD;  [facility]: [facility];  Service: Cardiovascular;  Laterality: N/A;  . THORACENTESIS     x 2  . THORACENTESIS N/A 11/07/2020    Procedure: Mathews Robinsons;  Surgeon: Melissa Clam, MD;  Location: [facility];  Service: Pulmonary;  Laterality: N/A;     OB History   No obstetric history on file.     Family History  Problem Relation Age of Onset  . Seizures Father   . CAD Father   . Diabetes Sister   . Lupus Sister   . Hypertension Sister   . Colon cancer Neg Hx   . Esophageal cancer Neg Hx   . Pancreatic cancer Neg Hx   . Stomach cancer Neg Hx     Social History   Tobacco Use  . Smoking status: Former Smoker    Types: Cigarettes  . Smokeless tobacco: Never Used  Vaping Use  . Vaping Use: Never used  Substance Use Topics  . Alcohol use: Not Currently  . Drug use: Not Currently    Home Medications Prior to Admission medications   Medication Sig Start Date End Date Taking? Authorizing Provider  acetaminophen (TYLENOL) 325 MG tablet Take 2 tablets (650 mg total) by mouth every 4 (four) hours as needed for headache or mild pain. 11/05/19   Kinnie Feil, MD  albuterol (VENTOLIN HFA) 108 (90 Base) MCG/ACT inhaler Inhale 2 puffs into the lungs every 6 (six) hours as needed for wheezing or shortness of breath. 11/30/19   [name], NP  allopurinol (ZYLOPRIM) 300 MG tablet Take 1 tablet (300 mg total) by mouth at bedtime. 06/14/20   [name], NP  apixaban (ELIQUIS) 5 MG TABS tablet Take 1 tablet (5 mg total) by mouth 2 (two) times daily. 12/15/19   [name], MD  atorvastatin (LIPITOR) 80 MG tablet Take 1 tablet (80 mg total) by mouth at bedtime. 08/22/20   [name], MD  cetirizine (ZYRTEC) 10 MG tablet Take 10 mg by mouth daily as needed for allergies.    [provider]  cinacalcet (SENSIPAR) 30 MG tablet Take 30 mg by mouth daily with supper.  04/18/20   [provider]  ferric citrate (AURYXIA) 1 GM 210 MG(Fe) tablet Take 210 mg by mouth 3 (three) times daily with meals.    [provider]  furosemide (LASIX) 40 MG tablet Take 80 mg by  mouth See admin instructions. Take 2 tablets (80 mg) by mouth twice daily on non-dialysis days - Sunday, Tuesday, Thursday    [provider]  lidocaine-prilocaine (EMLA) cream Apply 1 application topically See admin instructions. Apply topically to port access one hour prior to dialysis - Monday, Wednesday, Friday, Saturday 04/07/20   [provider]  metoprolol succinate (TOPROL-XL) 25 MG 24 hr tablet Take 1.5 tablets (37.5 mg total) by mouth 2 (two) times daily. 07/05/20   [name], MD  midodrine (PROAMATINE) 10 MG tablet Take 10 mg by mouth See admin instructions. Take one tablet (10 mg) by mouth on Monday, Wednesday, Friday, Saturday prior to dialysis 02/10/20  [provider]  montelukast (SINGULAIR) 10 MG tablet Take 1 tablet by mouth once daily Patient taking differently: Take 10 mg by mouth daily. 08/05/20   [name], NP  multivitamin (RENA-VIT) TABS tablet Take 1 tablet by mouth daily.    [provider]  vitamin B-12 (CYANOCOBALAMIN) 1000 MCG tablet Take 1,000 mcg by mouth daily.    [provider]  vitamin C (VITAMIN C) 500 MG tablet Take 1 tablet (500 mg total) by mouth daily. 11/06/19   Kinnie Feil, MD    Allergies    Black walnut flavor and Shellfish allergy  Review of Systems   Review of Systems  Constitutional: Negative for fever.  Respiratory: Negative for cough and shortness of breath.   Cardiovascular: Negative for chest pain.  Gastrointestinal: Positive for abdominal pain and blood in stool. Negative for nausea and vomiting.  Genitourinary: Negative for dysuria and hematuria.  Neurological: Negative for headaches.  All other systems reviewed and are negative.   Physical Exam Updated Vital Signs BP (!) 88/52   Pulse (!) 58   Temp 98.3 F (36.8 C) (Oral)   Resp 18   SpO2 100%   Physical Exam Vitals and nursing note reviewed.  Constitutional:      Appearance: Normal appearance. She is  well-developed and well-nourished.  HENT:     Head: Normocephalic and atraumatic.     Mouth/Throat:     Mouth: Oropharynx is clear and moist and mucous membranes are normal.  Eyes:     General: Lids are normal.     Extraocular Movements: EOM normal.     Conjunctiva/sclera: Conjunctivae normal.     Pupils: Pupils are equal, round, and reactive to light.  Cardiovascular:     Rate and Rhythm: Normal rate and regular rhythm.     Pulses:          Radial pulses are 1+ on the right side and 1+ on the left side.       Dorsalis pedis pulses are 1+ on the right side and 1+ on the left side.     Heart sounds: Normal heart sounds. No murmur heard. No friction rub. No gallop.      Comments: AV fistula noted in LUE with positive thrill  Pulmonary:     Effort: Pulmonary effort is normal.     Breath sounds: Normal breath sounds.     Comments: Lungs clear to auscultation bilaterally.  Symmetric chest rise.  No wheezing, rales, rhonchi. Abdominal:     Palpations: Abdomen is soft. Abdomen is not rigid.     Tenderness: There is abdominal tenderness in the epigastric area. There is no guarding.     Comments: Mild tenderness into the epigastric region.  Genitourinary:    Comments: The exam was performed with a chaperone present Zenia Resides, Therapist, sports).  Gross melena noted on digital rectal exam.  No external hemorrhoid.  Musculoskeletal:        General: Normal range of motion.     Cervical back: Full passive range of motion without pain.  Skin:    General: Skin is warm and dry.     Capillary Refill: Capillary refill takes less than 2 seconds.  Neurological:     Mental Status: She is alert and oriented to person, place, and time.  Psychiatric:        Mood and Affect: Mood and affect normal.        Speech: Speech normal.     ED Results / Procedures /  Treatments   Labs (all labs ordered are listed, but only abnormal results are displayed) Labs Reviewed  COMPREHENSIVE METABOLIC PANEL - Abnormal; Notable for  the following components:      Result Value   Chloride 92 (*)    Glucose, Bld 104 (*)    BUN 33 (*)    Creatinine, Ser 3.22 (*)    Calcium 7.7 (*)    Total Protein 5.8 (*)    Albumin 3.1 (*)    Total Bilirubin 1.5 (*)    GFR, Estimated 15 (*)    Anion gap 16 (*)    All other components within normal limits  CBC - Abnormal; Notable for the following components:   RBC 1.70 (*)    Hemoglobin 5.7 (*)    HCT 17.2 (*)    MCV 101.2 (*)    RDW 18.6 (*)    Platelets 146 (*)    nRBC 1.3 (*)    All other components within normal limits  POC OCCULT BLOOD, ED - Abnormal; Notable for the following components:   Fecal Occult Bld POSITIVE (*)    All other components within normal limits  PROTIME-INR  TYPE AND SCREEN  PREPARE RBC (CROSSMATCH)  PREPARE RBC (CROSSMATCH)    EKG None  Radiology No results found.  Procedures .Critical Care Performed by: Volanda Napoleon, PA-C Authorized by: Volanda Napoleon, PA-C   Critical care provider statement:    Critical care time (minutes):  45   Critical care was necessary to treat or prevent imminent or life-threatening deterioration of the following conditions:  Circulatory failure   Critical care was time spent personally by me on the following activities:  Discussions with consultants, evaluation of patient's response to treatment, examination of patient, ordering and performing treatments and interventions, ordering and review of laboratory studies, ordering and review of radiographic studies, pulse oximetry, re-evaluation of patient's condition, obtaining history from patient or surrogate and review of old charts   (including critical care time)  Medications Ordered in ED Medications  pantoprazole (PROTONIX) injection 40 mg (has no administration in time range)  0.9 %  sodium chloride infusion (has no administration in time range)  0.9 %  sodium chloride infusion (has no administration in time range)  pantoprazole (PROTONIX) 80 mg in  sodium chloride 0.9 % 100 mL (0.8 mg/mL) infusion (has no administration in time range)  octreotide (SANDOSTATIN) 2 mcg/mL load via infusion 50 mcg (has no administration in time range)    And  octreotide (SANDOSTATIN) 500 mcg in sodium chloride 0.9 % 250 mL (2 mcg/mL) infusion (has no administration in time range)  prothrombin complex conc human (KCENTRA) IVPB 5,370 Units (has no administration in time range)    ED Course  I have reviewed the triage vital signs and the nursing notes.  Pertinent labs & imaging results that were available during my care of the patient were reviewed by me and considered in my medical decision making (see chart for details).    MDM Rules/Calculators/A&P                          74 year old female possible history of A. fib who is currently on Eliquis, end-stage renal disease (Monday Wednesday Friday dialysis patient) who presents for evaluation of melena x3 days.  Also reports feeling fatigued, lightheaded which is what prompted her to come today.  Her last dialysis session was earlier today.  On initial arrival, she was afebrile, blood pressure stable,  heart rate was 104.  On reevaluation, patient was noted to be hypotensive with systolic blood pressures between 70-80.  She was brought back to the emergency department.  On exam, she has gross melena noted with digital rectal exam.  She is some mild epigastric tenderness.  Concern for GI bleed secondary from Eliquis use.  She states she has never had this happen before.  She has been on Coumadin for 5 years and then was transitioned to Eliquis about a year ago.  Her last colonoscopy was about 1 year ago in [place] and was normal.  She has not established with a GI doctor here.  Concern for GI bleed.  We will plan for labs.  Patient is fecal occult positive.  Hemoglobin today is 5.7.  No leukocytosis.  CMP shows BUN of 33, creatinine of 3.22.  Protonix initiated.  Will transfuse.  Discussed patient with  Dr. Havery Moros ([facility]).  They will plan to consult on patient.  He recommends adding octreotide to patient's course in the ED.  If patient becomes more unstable, admitting team can consult GI for further emergent intervention.  He recommends holding Eliquis.  He was discussed with admitting team regarding potentially reversing her Eliquis.  Discussed patient with Dr. Havery Moros (GI). He recommends reversing patient's eliquis given that she is an ESRD patient. Will start [place].   Plan for admission.  Portions of this note were generated with [employer]. Dictation errors may occur despite best attempts at proofreading.   Final Clinical Impression(s) / ED Diagnoses Final diagnoses:  Upper GI bleeding  Anemia, unspecified type    Rx / DC Orders ED Discharge Orders         Ordered    Consult for [facility] Admission       Provider:  (Not yet assigned)   11/13/20 1815           Volanda Napoleon, PA-C 11/13/20 2058    Horton, Alvin Critchley, DO 11/13/20 2107

## 2020-11-13 NOTE — ED Notes (Signed)
Rate dose change to 140

## 2020-11-13 NOTE — Telephone Encounter (Signed)
Pt. Reports she started having black stools last Friday and having dizziness. Taking Eliquis. States "I lost some blood during dialysis last week too." No availability in the practice. Pt. Will have son-in-law take her to ED. Reason for Disposition . Taking Coumadin (warfarin) or other strong blood thinner, or known bleeding disorder (e.g., thrombocytopenia)  Answer Assessment - Initial Assessment Questions 1. APPEARANCE of BLOOD: "What color is it?" "Is it passed separately, on the surface of the stool, or mixed in with the stool?"      Black 2. AMOUNT: "How much blood was passed?"      Unsure 3. FREQUENCY: "How many times has blood been passed with the stools?"      Every time 4. ONSET: "When was the blood first seen in the stools?" (Days or weeks)      Friday 5. DIARRHEA: "Is there also some diarrhea?" If Yes, ask: "How many diarrhea stools were passed in past 24 hours?"      No 6. CONSTIPATION: "Do you have constipation?" If Yes, ask: "How bad is it?"     No 7. RECURRENT SYMPTOMS: "Have you had blood in your stools before?" If Yes, ask: "When was the last time?" and "What happened that time?"      No 8. BLOOD THINNERS: "Do you take any blood thinners?" (e.g., Coumadin/warfarin, Pradaxa/dabigatran, aspirin)     Eliquis 9. OTHER SYMPTOMS: "Do you have any other symptoms?"  (e.g., abdominal pain, vomiting, dizziness, fever)     Abdominal pain/cramping 10. PREGNANCY: "Is there any chance you are pregnant?" "When was your last menstrual period?"       No  Protocols used: RECTAL BLEEDING-A-AH

## 2020-11-13 NOTE — ED Notes (Signed)
Pt looks well and states she is feeling fine

## 2020-11-13 NOTE — ED Triage Notes (Signed)
Stated she is on eliquis and concern for episode of black stool

## 2020-11-13 NOTE — H&P (Signed)
Melissa Adams ZOX:[id number] DOB: 1946-10-22 DOA: 11/13/2020    PCP: [name], NP   Outpatient Specialists:   CARDS:   Dr. Aundra Dubin NEphrology:    Donald Pore,    Pulmonary  Dr. Silas Flood   Patient arrived to ER on 11/13/20 at 1247 Referred by Attending Horton, Alvin Critchley, DO   Patient coming from: home Lives   With family    Chief Complaint:  Chief Complaint  Patient presents with  . Melena    HPI: Melissa Adams is a 74 y.o. female with medical history significant of ESRD on HD, systolic CHF, pleural effusion sp repeated thoracentesis by Pulmonology   hypotension on Midodrin when gets HD on M,w,F  A.fib on Eliquis, HLD, HTN, OSA, gout  Presented with black, tarry stools, no blood starting on Friday and being lightheaded. Denied prior history of GI bleeding reports some abdominal pain On Eliquis for a.fib for 1 year.  Reports fatigue, DOE, some epigastric discomfort decreased appetite Patient called her primary care office and was sent to emergency department Has had thoracentesis last week for chronic pleural effusion Last HD was today   Infectious risk factors:  Reports/abdominal pain,    Has   been vaccinated against COVID boosted 2 wks ago   Initial COVID TEST  NEGATIVE   Lab Results  Component Value Date   [place] 11/13/2020   [place] NEGATIVE 11/04/2020   [place] NEGATIVE 08/15/2020   [place] NEGATIVE 06/10/2020    Regarding pertinent Chronic problems:     Hyperlipidemia -  on statins on Lipitor Lipid Panel     Component Value Date/Time   CHOL 163 10/24/2020 1012   TRIG 51 10/24/2020 1012   HDL 60 10/24/2020 1012   CHOLHDL 2.7 10/24/2020 1012   VLDL 10 10/24/2020 1012   LDLCALC 93 10/24/2020 1012     HTN on  toprolol   chronic CHF diastolic/systolic/ combined - last echo may 2021 EF 30-35% Right ventricular systolic function is mildly reduced. Tricuspid valve regurgitation is moderate.  On  lasix bID      obesity-   BMI Readings from Last 1 Encounters:  11/07/20 38.62 kg/m      Asthma -well   controlled on home inhalers      OSA -on nocturnal oxygen, 2L   A. Fib -  - CHA2DS2 vas score   4      current  on anticoagulation with Eliquis,    recurrent bleeding         -  Rate control:  Currently controlled with  Toprolol,     ESRD on HD M,W,F on sensipar Estimated Creatinine Clearance: 17.2 mL/min (A) (by C-G formula based on SCr of 3.22 mg/dL (H)).  Lab Results  Component Value Date   CREATININE 3.22 (H) 11/13/2020   CREATININE 6.88 (H) 09/05/2020   CREATININE 6.01 (HH) 08/31/2020    Chronic anemia - baseline hg Hemoglobin & Hematocrit  Recent Labs    08/31/20 1637 09/05/20 1517 11/13/20 1318  HGB 12.3 11.7* 5.7*     While in ER:  Found to be Hemoccult positive grossly positive. Found to have hemoglobin of 5.7 Initially was hypotensive received 1 unit of blood with some improvement.  Now blood pressure of 100. Kcentra Protonix and octreotide ordered  ER Provider Called:   [facility]  Dr. Havery Moros  They Recommend admit to medicine  Give Kcentra Will see in AM or sooner if decompensates  ER Provider Called:   Nephrology Dr.  Johnney Ou They Recommend admit to medicine   Will see in AM for dialysis needs Okay to administer contrast with for CT if needed  Hospitalist was called for admission for Gi bleed  The following Work up has been ordered so far:  Orders Placed This Encounter  Procedures  . Critical Care  . Comprehensive metabolic panel  . CBC  . Protime-INR  . Diet NPO time specified  . Orthostatic Vital signs  . Place X2 Large Bore IV's  . Initiate Carrier Fluid Protocol  . Informed Consent Details: Physician/Practitioner Attestation; Transcribe to consent form and obtain patient signature  . Vital signs  . Informed Consent Details: Physician/Practitioner Attestation; Transcribe to consent form and obtain patient signature  .  Follow Oral  Anticoagulation Rapid Reversal Protocol  . Consult to gastroenterology  ALL PATIENTS BEING ADMITTED/HAVING PROCEDURES NEED COVID-19 SCREENING  . Consult to family practice  ALL PATIENTS BEING ADMITTED/HAVING PROCEDURES NEED COVID-19 SCREENING  . post Kcentra/Feiba/Praxbind pharmacy monitoring  . Consult for [facility] Admission  . Consult for [place] Admission  ALL PATIENTS BEING ADMITTED/HAVING PROCEDURES NEED COVID-19 SCREENING  . Consult to hospitalist  ALL PATIENTS BEING ADMITTED/HAVING PROCEDURES NEED COVID-19 SCREENING  . POC occult blood, ED  . Type and screen [place]  . Prepare RBC (crossmatch)  . Prepare RBC (crossmatch)   Following Medications were ordered in ER: Medications  pantoprazole (PROTONIX) injection 40 mg (has no administration in time range)  0.9 %  sodium chloride infusion (has no administration in time range)  0.9 %  sodium chloride infusion (has no administration in time range)  pantoprazole (PROTONIX) 80 mg in sodium chloride 0.9 % 100 mL (0.8 mg/mL) infusion (has no administration in time range)  octreotide (SANDOSTATIN) 2 mcg/mL load via infusion 50 mcg (has no administration in time range)    And  octreotide (SANDOSTATIN) 500 mcg in sodium chloride 0.9 % 250 mL (2 mcg/mL) infusion (has no administration in time range)  prothrombin complex conc human (KCENTRA) IVPB 5,370 Units (has no administration in time range)        Consult Orders  (From admission, onward)         Start     Ordered   11/13/20 1848  Consult to hospitalist  ALL PATIENTS BEING ADMITTED/HAVING PROCEDURES NEED COVID-19 SCREENING Paged by Kalman Shan  Once       Comments: ALL PATIENTS BEING ADMITTED/HAVING PROCEDURES NEED COVID-19 SCREENING  Provider:  (Not yet assigned)  Question Answer Comment  Place call to: [facility]   Reason for Consult Admit      11/13/20 1847         Significant initial  Findings: Abnormal Labs Reviewed   COMPREHENSIVE METABOLIC PANEL - Abnormal; Notable for the following components:      Result Value   Chloride 92 (*)    Glucose, Bld 104 (*)    BUN 33 (*)    Creatinine, Ser 3.22 (*)    Calcium 7.7 (*)    Total Protein 5.8 (*)    Albumin 3.1 (*)    Total Bilirubin 1.5 (*)    GFR, Estimated 15 (*)    Anion gap 16 (*)    All other components within normal limits  CBC - Abnormal; Notable for the following components:   RBC 1.70 (*)    Hemoglobin 5.7 (*)    HCT 17.2 (*)    MCV 101.2 (*)    RDW 18.6 (*)    Platelets 146 (*)  nRBC 1.3 (*)    All other components within normal limits  POC OCCULT BLOOD, ED - Abnormal; Notable for the following components:   Fecal Occult Bld POSITIVE (*)    All other components within normal limits   Otherwise labs showing:  Recent Labs  Lab 11/13/20 1318  NA 138  K 3.5  CO2 30  GLUCOSE 104*  BUN 33*  CREATININE 3.22*  CALCIUM 7.7*    Cr  below baseline see below Lab Results  Component Value Date   CREATININE 3.22 (H) 11/13/2020   CREATININE 6.88 (H) 09/05/2020   CREATININE 6.01 (HH) 08/31/2020    Recent Labs  Lab 11/13/20 1318  AST 35  ALT 28  ALKPHOS 115  BILITOT 1.5*  PROT 5.8*  ALBUMIN 3.1*   Lab Results  Component Value Date   CALCIUM 7.7 (L) 11/13/2020   PHOS 5.2 (H) 06/14/2020   WBC      Component Value Date/Time   WBC 7.9 11/13/2020 1318   LYMPHSABS 1.3 08/31/2020 1637   LYMPHSABS 1.4 06/22/2020 1126   MONOABS 1.0 08/31/2020 1637   EOSABS 0.0 08/31/2020 1637   EOSABS 0.0 06/22/2020 1126   BASOSABS 0.0 08/31/2020 1637   BASOSABS 0.0 06/22/2020 1126    Plt: Lab Results  Component Value Date   PLT 146 (L) 11/13/2020      COVID-19 Labs  No results for input(s): DDIMER, FERRITIN, LDH, CRP in the last 72 hours.  Lab Results  Component Value Date   SARSCOV2NAA NEGATIVE 11/04/2020   SARSCOV2NAA NEGATIVE 08/15/2020   [place] NEGATIVE 06/10/2020   [place] NEGATIVE 05/20/2020      HG/HCT    Down   from baseline see below    Component Value Date/Time   HGB 5.7 (LL) 11/13/2020 1318   HGB 11.4 06/22/2020 1126   HCT 17.2 (L) 11/13/2020 1318   HCT 35.1 06/22/2020 1126   MCV 101.2 (H) 11/13/2020 1318   MCV 96 06/22/2020 1126       ECG: Ordered    BNP (last 3 results) Recent Labs    05/05/20 1109 06/10/20 1638  BNP 3,410.0* 3,465.4*      DM  labs:  HbA1C: Recent Labs    06/22/20 1121  HGBA1C 5.2     CBG (last 3)  No results for input(s): GLUCAP in the last 72 hours.     UA not ordered        Ordered    CXR - small right pleural effusion, significant cardiomegaly    ED Triage Vitals  Enc Vitals Group     BP 11/13/20 1319 (!) 84/24     Pulse Rate 11/13/20 1319 (!) 105     Resp 11/13/20 1319 15     Temp 11/13/20 1319 97.8 F (36.6 C)     Temp Source 11/13/20 1319 Oral     SpO2 11/13/20 1319 93 %     Weight --      Height --      Head Circumference --      Peak Flow --      Pain Score 11/13/20 1317 0     Pain Loc --      Pain Edu? --      Excl. in [place]? --   TMAX(24)@       Latest  Blood pressure (!) 92/35, pulse (!) 105, temperature 98.3 F (36.8 C), temperature source Oral, resp. rate (!) 24, SpO2 97 %.    Review of Systems:  Pertinent positives include:  dyspnea on exertion,  fatigue,  melena,  indigestion, abdominal pain Constitutional:  No weight loss, night sweats, Fevers, chills, weight loss  HEENT:  No headaches, Difficulty swallowing,Tooth/dental problems,Sore throat,  No sneezing, itching, ear ache, nasal congestion, post nasal drip,  Cardio-vascular:  No chest pain, Orthopnea, PND, anasarca, dizziness, palpitations.no Bilateral lower extremity swelling  GI:  No heartburn,, nausea, vomiting, diarrhea, change in bowel habits, loss of appetite,blood in stool, hematemesis Resp:  no shortness of breath at rest. No No excess mucus, no productive cough, No non-productive cough, No coughing up of blood.No change in color of mucus.No  wheezing. Skin:  no rash or lesions. No jaundice GU:  no dysuria, change in color of urine, no urgency or frequency. No straining to urinate.  No flank pain.  Musculoskeletal:  No joint pain or no joint swelling. No decreased range of motion. No back pain.  Psych:  No change in mood or affect. No depression or anxiety. No memory loss.  Neuro: no localizing neurological complaints, no tingling, no weakness, no double vision, no gait abnormality, no slurred speech, no confusion  All systems reviewed and apart from [place] all are negative  Past Medical History:   Past Medical History:  Diagnosis Date  . A-fib ([place])   . Allergies   . Arthritis   . Asthma   . Cardiomyopathy ([place])   . CHF (congestive heart failure) ([place])   . Chronic kidney disease   . Edema   . History of hemodialysis   . Hyperlipidemia   . Hypertension   . Pulmonary hypertension ([place])   . Sleep apnea   . Tricuspid regurgitation       Past Surgical History:  Procedure Laterality Date  . A/V FISTULAGRAM Left 05/23/2020   Procedure: A/V FISTULAGRAM;  Surgeon: [name], MD;  Location: [place] [facility] LAB;  Service: Cardiovascular;  Laterality: Left;  . ABDOMINAL HYSTERECTOMY    . AV FISTULA PLACEMENT Left 11/04/2019   Procedure: Insertion Of Arteriovenous (Av) Gore-Tex Graft Arm;  Surgeon: [name], MD;  Location: [facility];  Service: Vascular;  Laterality: Left;  . CARDIAC CATHETERIZATION    . CARDIOVERSION N/A 10/26/2019   Procedure: CARDIOVERSION;  Surgeon: [name], MD;  Location: [facility] ENDOSCOPY;  Service: Cardiovascular;  Laterality: N/A;  . COLONOSCOPY W/ BIOPSIES AND POLYPECTOMY    . FISTULA SUPERFICIALIZATION Left 01/21/2020   Procedure: FISTULA SUPERFICIALIZATION OF FISTULA;  Surgeon: [name], MD;  Location: [place];  Service: Vascular;  Laterality: Left;  . INSERTION OF DIALYSIS CATHETER Right 11/04/2019   Procedure: CONVERT TEMPORARY DIALYSIS CATHETER TO TUNNELED DIALYSIS  CATHETER Right Internal Jugular.;  Surgeon: [name], MD;  Location: [facility];  Service: Vascular;  Laterality: Right;  . IR THORACENTESIS ASP PLEURAL SPACE W/IMG GUIDE  08/17/2020  . MULTIPLE TOOTH EXTRACTIONS    . REVISION OF ARTERIOVENOUS GORETEX GRAFT Left 01/21/2020   Procedure: REVISION OF ARTERIOVENOUS FISTULA WITH SIDE BRANCH LIGATION;  Surgeon: [name], MD;  Location: [facility];  Service: Vascular;  Laterality: Left;  . TEE WITHOUT CARDIOVERSION N/A 10/26/2019   Procedure: TRANSESOPHAGEAL ECHOCARDIOGRAM (TEE);  Surgeon: [name], MD;  Location: [facility] ENDOSCOPY;  Service: Cardiovascular;  Laterality: N/A;  . THORACENTESIS     x 2  . THORACENTESIS N/A 11/07/2020   Procedure: Mathews Robinsons;  Surgeon: [name], MD;  Location: [facility] ENDOSCOPY;  Service: Pulmonary;  Laterality: N/A;   Social History:  Ambulatory cane  reports that she has quit smoking. Her smoking use included cigarettes. She has never used smokeless tobacco. She reports previous alcohol use. She reports previous drug use.   Family History:   Family History  Problem Relation Age of Onset  . Seizures Father   . CAD Father   . Diabetes Sister   . Lupus Sister   . Hypertension Sister   . Colon cancer Neg Hx   . Esophageal cancer Neg Hx   . Pancreatic cancer Neg Hx   . Stomach cancer Neg Hx     Allergies: Allergies  Allergen Reactions  . [employer]  . Shellfish Allergy Itching    Makes throat itch      Prior to Admission medications   Medication Sig Start Date End Date Taking? Authorizing Provider  acetaminophen (TYLENOL) 325 MG tablet Take 2 tablets (650 mg total) by mouth every 4 (four) hours as needed for headache or mild pain. 11/05/19  Yes Buriev, Arie Sabina, MD  albuterol (VENTOLIN HFA) 108 (90 Base) MCG/ACT inhaler Inhale 2 puffs into the lungs every 6 (six) hours as needed for wheezing or shortness of breath. 11/30/19  Yes [name], NP   allopurinol (ZYLOPRIM) 300 MG tablet Take 1 tablet (300 mg total) by mouth at bedtime. 06/14/20  Yes [name], NP  apixaban (ELIQUIS) 5 MG TABS tablet Take 1 tablet (5 mg total) by mouth 2 (two) times daily. 12/15/19  Yes [name], MD  atorvastatin (LIPITOR) 80 MG tablet Take 1 tablet (80 mg total) by mouth at bedtime. 08/22/20  Yes [name], MD  cetirizine (ZYRTEC) 10 MG tablet Take 10 mg by mouth daily as needed for allergies.   Yes [provider]  cinacalcet (SENSIPAR) 30 MG tablet Take 30 mg by mouth daily with supper.  04/18/20  Yes [provider]  ferric citrate (AURYXIA) 1 GM 210 MG(Fe) tablet Take 210 mg by mouth 3 (three) times daily with meals.   Yes [provider]  furosemide (LASIX) 40 MG tablet Take 80 mg by mouth See admin instructions. Take 2 tablets (80 mg) by mouth twice daily on non-dialysis days - Sunday, Tuesday, Thursday   Yes [provider]  lidocaine-prilocaine (EMLA) cream Apply 1 application topically See admin instructions. Apply topically to port access one hour prior to dialysis - Monday, Wednesday, Friday, Saturday 04/07/20  Yes [provider]  midodrine (PROAMATINE) 10 MG tablet Take 10 mg by mouth See admin instructions. Take one tablet (10 mg) by mouth on Monday, Wednesday, Friday, Saturday prior to dialysis 02/10/20  Yes [provider]  montelukast (SINGULAIR) 10 MG tablet Take 1 tablet by mouth once daily Patient taking differently: Take 10 mg by mouth daily. 08/05/20  Yes [name], NP  multivitamin (RENA-VIT) TABS tablet Take 1 tablet by mouth daily.   Yes [provider]  vitamin B-12 (CYANOCOBALAMIN) 1000 MCG tablet Take 1,000 mcg by mouth daily.   Yes [provider]  vitamin C (VITAMIN C) 500 MG tablet Take 1 tablet (500 mg total) by mouth daily. 11/06/19  Yes Kinnie Feil, MD   Physical Exam: Vitals with BMI 11/13/2020 11/13/2020 11/13/2020   Height - - -  Weight - - -  BMI - - -  Systolic 92 86 88  Diastolic 35 46 52  Pulse 333 53 58    1. General:  in No   Acute distress    Chronically ill  -appearing 2.  Psychological: Alert and  Oriented 3. Head/ENT:    Dry Mucous Membranes                          Head Non traumatic, neck supple                         Poor Dentition 4. SKIN:  decreased Skin turgor,  Skin clean Dry and intact no rash 5. Heart: Regular rate and rhythm systolic Murmur, no Rub or gallop 6. Lungs:  no wheezes or crackles   7. Abdomen: Soft, epigastric tender, Non distended  obese  bowel sounds present 8. Lower extremities: no clubbing, cyanosis, no edema 9. Neurologically Grossly intact, moving all 4 extremities equally   10. MSK: Normal range of motion  All other LABS:    Recent Labs  Lab 11/13/20 1318  WBC 7.9  HGB 5.7*  HCT 17.2*  MCV 101.2*  PLT 146*     Recent Labs  Lab 11/13/20 1318  NA 138  K 3.5  CL 92*  CO2 30  GLUCOSE 104*  BUN 33*  CREATININE 3.22*  CALCIUM 7.7*     Recent Labs  Lab 11/13/20 1318  AST 35  ALT 28  ALKPHOS 115  BILITOT 1.5*  PROT 5.8*  ALBUMIN 3.1*    Cultures:    Component Value Date/Time   SDES PLEURAL FLUID 06/11/2020 0959   SDES PLEURAL FLUID 06/11/2020 0959   SPECREQUEST NONE 06/11/2020 0959   SPECREQUEST NONE 06/11/2020 0959   CULT  06/11/2020 0959    NO GROWTH 5 DAYS Performed at [facility], [place] [address], [place], [place] [address]    REPTSTATUS 06/11/2020 FINAL 06/11/2020 0959   REPTSTATUS 06/16/2020 FINAL 06/11/2020 0959     Radiological Exams on Admission: No results found.  Chart has been reviewed    Assessment/Plan   74 y.o. female with medical history significant of ESRD on HD, systolic CHF, pleural effusion sp repeated thoracentesis by Pulmonology   hypotension on Midodrin when gets HD on M,w,F, A.fib on Eliquis, HLD, HTN, OSA, gout   Admitted for Upper Gi  Bleed   Present on Admission:  . Upper GI  bleed -  - [place] Bl[place] score BUN >18.2   Hg   <99M  , systolic BP <426    melena  CHF   >1 Justifies admission and aggressive management     Modifying risk factors include:    anticoagulation,        -    AIMS 65 =  TOTAL of 1          -  hemodynamic instability present      -  Admit to stepdown given above   -  ER  Provider spoke to gastroenterology (  [facility]) they will see patient in a.m. appreciate their consult   - serial CBC.    - Monitor for any recurrence,  evidence of hemodynamic instability or significant blood loss  - Transfuse 1 unit  for hemoglobin below 7  May need further transfusions but have to be aware of fluid status  - Administered kcentra   - Establish at least 2 PIV and fluid resuscitate   - clear liquids for tonight keep nothing by mouth post midnight,   -  administer Protonix  drip   -   Octreotide ordered      ESRD -on hemodialysis Monday Wednesday Friday last hemodialysis was  today on 20 December Nephrology is aware will continue to consult regarding hemodialysis needs.  . Essential hypertension -hold home medications given hypotension Give midodrine to support BP   . Atrial fibrillation, chronic (HCC) -hold anticoagulation given active GI bleed.  Hold metoprolol given hypotension.  discussed with cardiology fellow at night felt amiodarone not a good choice as pt not anticoagulated curretnly Cardiology consult for difficult to mange a.fib and significant systolic CHF   . Chronic systolic CHF (congestive heart failure) (HCC) -current appears to be slightly on the dry side hold off on Lasix given hypotension, appreciate Cardiology consult given hard to control volume  . Acute blood loss anemia -transfuse starting with 1 unit repeat and may need to transfuse more versus give blood with hemodialysis.  . Sleep apnea -on 2 L of oxygen at night we will continue  . Pleural effusion -chronic recurrent followed by pulmonology with recent thoracentesis Showing  exudative pattern which has been chronic obtain chest x-ray As patient has been having some worsening shortness of breath which could be secondary to anemia but also could be secondary to recurrent pleural effusion  Other plan as per orders.  DVT prophylaxis:  SCD       Code Status:    Code Status: Prior  DNI ok to do CPR as per patient  I had personally discussed CODE STATUS with patient     Family Communication:   Family not at  Bedside    Disposition Plan:        To home once workup is complete and patient is stable    Following barriers for discharge:                            Electrolytes corrected                               Anemia corrected                             Pain controlled with PO medications                                                           Will need to be able to tolerate PO                                                      Will need consultants to evaluate patient prior to discharge                        Would benefit from PT/OT eval prior to DC  Ordered                                       Consults called: nephrology, [facility] Gi   Admission status:  ED Disposition    ED Disposition Condition [facility]: [facility] [  [id number]]  Level of Care: Progressive [102]  Admit to Progressive based on following criteria: GI, ENDOCRINE disease patients with GI bleeding, acute liver failure or pancreatitis, stable with diabetic ketoacidosis or thyrotoxicosis (hypothyroid) state.  May admit patient to [facility] or [facility] if equivalent level of care is available:: No  Covid Evaluation: Asymptomatic Screening Protocol (No Symptoms)  Diagnosis: GI bleed [[id number]]  Admitting Physician: Toy Baker [[id number]]  Attending Physician: Toy Baker [[id number]]  Estimated length of stay: past midnight tomorrow  Certification:: I certify this patient will need inpatient services for at least 2 midnights        inpatient      I Expect 2 midnight stay secondary to severity of patient's current illness need for inpatient interventions justified by the following:  hemodynamic instability despite optimal treatment ( hypotension  )   Severe lab/radiological/exam abnormalities including:    sever anemia and extensive comorbidities including:   CHF   asthma  Obesity  ERD on HD .  Chronic anticoagulation  That are currently affecting medical management.   I expect  patient to be hospitalized for 2 midnights requiring inpatient medical care.  Patient is at high risk for adverse outcome (such as loss of life or disability) if not treated.  Indication for inpatient stay as follows:    Hemodynamic instability despite maximal medical therapy,    inability to maintain oral hydration    Need for operative/procedural  intervention   Need for IV PPI   Level of care    SDU tele indefinitely please discontinue once patient no longer qualifies COVID-19 Labs    Lab Results  Component Value Date   [place] NEGATIVE 11/04/2020    Precautions: admitted as asymptomatic screening protocol  PPE: Used by the provider:   P100  eye Goggles,  Gloves     Hobson Lax 11/14/2020, 1:13 AM    [facility]     after 2 AM please page floor coverage PA If 7AM-7PM, please contact the day team taking care of the patient using [email or web address]   Patient was evaluated in the context of the global COVID-19 pandemic, which necessitated consideration that the patient might be at risk for infection with the SARS-CoV-2 virus that causes COVID-19. Institutional protocols and algorithms that pertain to the evaluation of patients at risk for COVID-19 are in a state of rapid change based on information released by regulatory bodies including the CDC and federal and state organizations. These policies and algorithms were followed during the patient's care.

## 2020-11-13 NOTE — ED Notes (Signed)
Rate dose change to 150. Pt states that she feels well and is doing ok

## 2020-11-13 NOTE — ED Provider Notes (Signed)
Pt signed out to me by L Layden, PA-C. Please see previous notes for further history.   In brief, patient presenting for evaluation of GI bleed.  Grossly positive Hemoccult.  Hemoglobin of 5.7.  Patient initially hypotensive, she has received blood and on my evaluation, blood pressure improved with over 810 systolic.  She is on Eliquis.  She was discussed with Dr. Havery Moros from of our GI who recommended Kcentra.  They will evaluate patient in the morning.  However patient declines, recommend reconsultation to Dr. Fuller Plan who is on-call overnight. Pt pending admission call.   Discussed with Dr. Roel Cluck from tried hospitalist service, patient to be admitted.  Requesting nephrology consult as patient is a dialysis patient.  Discussed with Dr. Johnney Ou from nephrology, they will consult on patient for dialysis needs as needed.  Patient can receive whenever CT imaging with contrast that is appropriate, as she is on dialysis.    Franchot Heidelberg, PA-C 11/13/20 1947    Lorelle Gibbs, DO 11/13/20 2010

## 2020-11-13 NOTE — Progress Notes (Signed)
PIV consult: R arm assessed with ultrasound. Pt has been assessed by 3 VAST RNs. No suitable vein noted. Please stagger infusions, keep BP cuff below current site. Consider central line if prolonged/ multiple infusions required.

## 2020-11-14 ENCOUNTER — Inpatient Hospital Stay (HOSPITAL_COMMUNITY): Payer: Medicare HMO | Admitting: Anesthesiology

## 2020-11-14 ENCOUNTER — Encounter (HOSPITAL_COMMUNITY)
Admission: EM | Disposition: A | Discharge status: Home / Self Care | Payer: Self-pay | Source: Home / Self Care | Attending: Internal Medicine

## 2020-11-14 ENCOUNTER — Encounter (HOSPITAL_COMMUNITY): Payer: Self-pay | Admitting: Internal Medicine

## 2020-11-14 ENCOUNTER — Other Ambulatory Visit: Payer: Self-pay

## 2020-11-14 DIAGNOSIS — K7460 Unspecified cirrhosis of liver: Secondary | ICD-10-CM

## 2020-11-14 DIAGNOSIS — K3189 Other diseases of stomach and duodenum: Secondary | ICD-10-CM

## 2020-11-14 DIAGNOSIS — T82898A Other specified complication of vascular prosthetic devices, implants and grafts, initial encounter: Secondary | ICD-10-CM

## 2020-11-14 DIAGNOSIS — I4891 Unspecified atrial fibrillation: Secondary | ICD-10-CM

## 2020-11-14 DIAGNOSIS — K921 Melena: Secondary | ICD-10-CM

## 2020-11-14 DIAGNOSIS — Z992 Dependence on renal dialysis: Secondary | ICD-10-CM

## 2020-11-14 DIAGNOSIS — K317 Polyp of stomach and duodenum: Secondary | ICD-10-CM

## 2020-11-14 DIAGNOSIS — N186 End stage renal disease: Secondary | ICD-10-CM

## 2020-11-14 DIAGNOSIS — R188 Other ascites: Secondary | ICD-10-CM

## 2020-11-14 DIAGNOSIS — D3A010 Benign carcinoid tumor of the duodenum: Secondary | ICD-10-CM

## 2020-11-14 HISTORY — PX: ESOPHAGOGASTRODUODENOSCOPY (EGD) WITH PROPOFOL: SHX5813

## 2020-11-14 HISTORY — PX: BIOPSY: SHX5522

## 2020-11-14 HISTORY — DX: Benign carcinoid tumor of the duodenum: D3A.010

## 2020-11-14 LAB — CBC
HCT: 22.8 % — ABNORMAL LOW (ref 36.0–46.0)
HCT: 24.7 % — ABNORMAL LOW (ref 36.0–46.0)
Hemoglobin: 7.3 g/dL — ABNORMAL LOW (ref 12.0–15.0)
Hemoglobin: 7.8 g/dL — ABNORMAL LOW (ref 12.0–15.0)
MCH: 32 pg (ref 26.0–34.0)
MCH: 32.1 pg (ref 26.0–34.0)
MCHC: 31.6 g/dL (ref 30.0–36.0)
MCHC: 32 g/dL (ref 30.0–36.0)
MCV: 100 fL (ref 80.0–100.0)
MCV: 101.6 fL — ABNORMAL HIGH (ref 80.0–100.0)
Platelets: 143 10*3/uL — ABNORMAL LOW (ref 150–400)
Platelets: 166 10*3/uL (ref 150–400)
RBC: 2.28 MIL/uL — ABNORMAL LOW (ref 3.87–5.11)
RBC: 2.43 MIL/uL — ABNORMAL LOW (ref 3.87–5.11)
RDW: 17.9 % — ABNORMAL HIGH (ref 11.5–15.5)
RDW: 18.7 % — ABNORMAL HIGH (ref 11.5–15.5)
WBC: 9.4 10*3/uL (ref 4.0–10.5)
WBC: 9.5 10*3/uL (ref 4.0–10.5)
nRBC: 3.5 % — ABNORMAL HIGH (ref 0.0–0.2)
nRBC: 8 % — ABNORMAL HIGH (ref 0.0–0.2)

## 2020-11-14 LAB — PROTIME-INR
INR: 1.4 — ABNORMAL HIGH (ref 0.8–1.2)
Prothrombin Time: 16.6 seconds — ABNORMAL HIGH (ref 11.4–15.2)

## 2020-11-14 LAB — COMPREHENSIVE METABOLIC PANEL
ALT: 29 U/L (ref 0–44)
AST: 35 U/L (ref 15–41)
Albumin: 3 g/dL — ABNORMAL LOW (ref 3.5–5.0)
Alkaline Phosphatase: 123 U/L (ref 38–126)
Anion gap: 15 (ref 5–15)
BUN: 46 mg/dL — ABNORMAL HIGH (ref 8–23)
CO2: 29 mmol/L (ref 22–32)
Calcium: 7.6 mg/dL — ABNORMAL LOW (ref 8.9–10.3)
Chloride: 91 mmol/L — ABNORMAL LOW (ref 98–111)
Creatinine, Ser: 4.17 mg/dL — ABNORMAL HIGH (ref 0.44–1.00)
GFR, Estimated: 11 mL/min — ABNORMAL LOW (ref >60)
Glucose, Bld: 103 mg/dL — ABNORMAL HIGH (ref 70–99)
Potassium: 4.1 mmol/L (ref 3.5–5.1)
Sodium: 135 mmol/L (ref 135–145)
Total Bilirubin: 1.9 mg/dL — ABNORMAL HIGH (ref 0.3–1.2)
Total Protein: 5.5 g/dL — ABNORMAL LOW (ref 6.5–8.1)

## 2020-11-14 LAB — CBC WITH DIFFERENTIAL/PLATELET
Abs Immature Granulocytes: 0.12 10*3/uL — ABNORMAL HIGH (ref 0.00–0.07)
Basophils Absolute: 0 10*3/uL (ref 0.0–0.1)
Basophils Relative: 0 %
Eosinophils Absolute: 0 10*3/uL (ref 0.0–0.5)
Eosinophils Relative: 0 %
HCT: 21.5 % — ABNORMAL LOW (ref 36.0–46.0)
Hemoglobin: 7.3 g/dL — ABNORMAL LOW (ref 12.0–15.0)
Immature Granulocytes: 1 %
Lymphocytes Relative: 12 %
Lymphs Abs: 1.1 10*3/uL (ref 0.7–4.0)
MCH: 33.3 pg (ref 26.0–34.0)
MCHC: 34 g/dL (ref 30.0–36.0)
MCV: 98.2 fL (ref 80.0–100.0)
Monocytes Absolute: 1.2 10*3/uL — ABNORMAL HIGH (ref 0.1–1.0)
Monocytes Relative: 13 %
Neutro Abs: 6.6 10*3/uL (ref 1.7–7.7)
Neutrophils Relative %: 74 %
Platelets: 146 10*3/uL — ABNORMAL LOW (ref 150–400)
RBC: 2.19 MIL/uL — ABNORMAL LOW (ref 3.87–5.11)
RDW: 18 % — ABNORMAL HIGH (ref 11.5–15.5)
WBC: 9.1 10*3/uL (ref 4.0–10.5)
nRBC: 5 % — ABNORMAL HIGH (ref 0.0–0.2)

## 2020-11-14 LAB — TSH: TSH: 3.493 u[IU]/mL (ref 0.350–4.500)

## 2020-11-14 LAB — PHOSPHORUS: Phosphorus: 3.6 mg/dL (ref 2.5–4.6)

## 2020-11-14 LAB — PREPARE RBC (CROSSMATCH)

## 2020-11-14 LAB — MAGNESIUM: Magnesium: 2 mg/dL (ref 1.7–2.4)

## 2020-11-14 SURGERY — ESOPHAGOGASTRODUODENOSCOPY (EGD) WITH PROPOFOL
Anesthesia: Monitor Anesthesia Care | Laterality: Left

## 2020-11-14 MED ORDER — CHLORHEXIDINE GLUCONATE CLOTH 2 % EX PADS
6.0000 | MEDICATED_PAD | Freq: Every day | CUTANEOUS | Status: DC
  Administered 2020-11-14 – 2020-11-18 (×5): 6 via TOPICAL

## 2020-11-14 MED ORDER — PHENYLEPHRINE 40 MCG/ML (10ML) SYRINGE FOR IV PUSH (FOR BLOOD PRESSURE SUPPORT)
PREFILLED_SYRINGE | INTRAVENOUS | Status: DC | PRN
  Administered 2020-11-14 (×5): 120 ug via INTRAVENOUS

## 2020-11-14 MED ORDER — MIDODRINE HCL 5 MG PO TABS
ORAL_TABLET | ORAL | Status: AC
  Administered 2020-11-14: 10 mg via ORAL
  Filled 2020-11-14: qty 2

## 2020-11-14 MED ORDER — SODIUM CHLORIDE 0.9 % IV SOLN: 100.0000 mL | INTRAVENOUS | Status: DC | PRN

## 2020-11-14 MED ORDER — AMIODARONE HCL 200 MG PO TABS
200.0000 mg | ORAL_TABLET | Freq: Two times a day (BID) | ORAL | Status: DC
  Administered 2020-11-14 – 2020-11-19 (×10): 200 mg via ORAL
  Filled 2020-11-14 (×10): qty 1

## 2020-11-14 MED ORDER — PROPOFOL 10 MG/ML IV BOLUS
INTRAVENOUS | Status: DC | PRN
  Administered 2020-11-14: 25 mg via INTRAVENOUS

## 2020-11-14 MED ORDER — SODIUM CHLORIDE 0.9 % IV SOLN
100.0000 mL | INTRAVENOUS | Status: DC | PRN
  Administered 2020-11-16: 100 mL via INTRAVENOUS

## 2020-11-14 MED ORDER — VASOPRESSIN 20 UNIT/ML IV SOLN
INTRAVENOUS | Status: DC | PRN
  Administered 2020-11-14 (×2): 2 [IU] via INTRAVENOUS

## 2020-11-14 MED ORDER — LIDOCAINE HCL (PF) 1 % IJ SOLN: 5.0000 mL | INTRAMUSCULAR | Status: DC | PRN

## 2020-11-14 MED ORDER — ALBUMIN HUMAN 25 % IV SOLN
25.0000 g | Freq: Once | INTRAVENOUS | Status: AC
  Filled 2020-11-14: qty 100

## 2020-11-14 MED ORDER — SODIUM CHLORIDE 0.9% IV SOLUTION: Freq: Once | INTRAVENOUS | Status: DC

## 2020-11-14 MED ORDER — ALBUMIN HUMAN 5 % IV SOLN: INTRAVENOUS | Status: DC | PRN

## 2020-11-14 MED ORDER — ALBUMIN HUMAN 25 % IV SOLN
INTRAVENOUS | Status: AC
  Administered 2020-11-14: 25 g via INTRAVENOUS
  Filled 2020-11-14: qty 100

## 2020-11-14 MED ORDER — SODIUM CHLORIDE 0.9 % IV BOLUS
250.0000 mL | Freq: Once | INTRAVENOUS | Status: AC
  Administered 2020-11-14: 250 mL via INTRAVENOUS

## 2020-11-14 MED ORDER — SODIUM CHLORIDE 0.9 % IV SOLN: INTRAVENOUS | Status: DC | PRN

## 2020-11-14 MED ORDER — PENTAFLUOROPROP-TETRAFLUOROETH EX AERO
1.0000 "application " | INHALATION_SPRAY | CUTANEOUS | Status: DC | PRN
  Filled 2020-11-14: qty 116

## 2020-11-14 MED ORDER — PHENYLEPHRINE HCL-NACL 10-0.9 MG/250ML-% IV SOLN
INTRAVENOUS | Status: DC | PRN
  Administered 2020-11-14: 100 ug/min via INTRAVENOUS

## 2020-11-14 MED ORDER — SODIUM CHLORIDE 0.9 % IV SOLN
1.0000 g | INTRAVENOUS | Status: DC
  Administered 2020-11-14 – 2020-11-19 (×5): 1 g via INTRAVENOUS
  Filled 2020-11-14: qty 1
  Filled 2020-11-14: qty 10
  Filled 2020-11-14 (×3): qty 1
  Filled 2020-11-14: qty 10

## 2020-11-14 MED ORDER — LIDOCAINE-PRILOCAINE 2.5-2.5 % EX CREA
1.0000 "application " | TOPICAL_CREAM | CUTANEOUS | Status: DC | PRN
  Filled 2020-11-14: qty 5

## 2020-11-14 MED ORDER — LIDOCAINE 2% (20 MG/ML) 5 ML SYRINGE
INTRAMUSCULAR | Status: DC | PRN
  Administered 2020-11-14: 60 mg via INTRAVENOUS

## 2020-11-14 MED ORDER — PANTOPRAZOLE SODIUM 40 MG IV SOLR
40.0000 mg | Freq: Two times a day (BID) | INTRAVENOUS | Status: DC
  Administered 2020-11-14 – 2020-11-15 (×3): 40 mg via INTRAVENOUS
  Filled 2020-11-14 (×3): qty 40

## 2020-11-14 MED ORDER — MIDODRINE HCL 5 MG PO TABS: 10.0000 mg | ORAL_TABLET | Freq: Once | ORAL | Status: AC

## 2020-11-14 MED ORDER — PROPOFOL 500 MG/50ML IV EMUL
INTRAVENOUS | Status: DC | PRN
  Administered 2020-11-14: 125 ug/kg/min via INTRAVENOUS

## 2020-11-14 SURGICAL SUPPLY — 15 items

## 2020-11-14 NOTE — Progress Notes (Signed)
Patient to room 4E01. Vital signs obtained. On monitor CCMD notified. CHG bath completed. Alert and oriented to room and call light. Call bell within reach. Era Bumpers, RN

## 2020-11-14 NOTE — Interval H&P Note (Signed)
History and Physical Interval Note:  11/14/2020 1:39 PM  Melissa Adams  has presented today for surgery, with the diagnosis of upper gi bleed, melena.  The various methods of treatment have been discussed with the patient and family. After consideration of risks, benefits and other options for treatment, the patient has consented to  Procedure(s): ESOPHAGOGASTRODUODENOSCOPY (EGD) WITH PROPOFOL (Left) as a surgical intervention.  The patient's history has been reviewed, patient examined, no change in status, stable for surgery.  I have reviewed the patient's chart and labs.  Questions were answered to the patient's satisfaction.     [employer]

## 2020-11-14 NOTE — ED Notes (Signed)
Admitting MD paged regarding hypotension at this time.

## 2020-11-14 NOTE — Anesthesia Preprocedure Evaluation (Addendum)
Anesthesia Evaluation  Patient identified by MRN, date of birth, ID band Patient awake    Reviewed: Allergy & Precautions, H&P , NPO status , Patient's Chart, lab work & pertinent test results  Airway Mallampati: II  TM Distance: >3 FB Neck ROM: Full    Dental no notable dental hx. (+) Partial Lower, Partial Upper, Dental Advisory Given   Pulmonary asthma , sleep apnea , former smoker,    Pulmonary exam normal breath sounds clear to auscultation       Cardiovascular hypertension, +CHF   Rhythm:Regular Rate:Normal     Neuro/Psych negative neurological ROS  negative psych ROS   GI/Hepatic negative GI ROS, Neg liver ROS,   Endo/Other  negative endocrine ROS  Renal/GU Renal disease  negative genitourinary   Musculoskeletal  (+) Arthritis , Osteoarthritis,    Abdominal   Peds  Hematology  (+) Blood dyscrasia, anemia ,   Anesthesia Other Findings   Reproductive/Obstetrics negative OB ROS                            Anesthesia Physical Anesthesia Plan  ASA: IV  Anesthesia Plan: MAC   Post-op Pain Management:    Induction: Intravenous  PONV Risk Score and Plan: 2 and Propofol infusion and Treatment may vary due to age or medical condition  Airway Management Planned: Nasal Cannula  Additional Equipment:   Intra-op Plan:   Post-operative Plan:   Informed Consent: I have reviewed the patients History and Physical, chart, labs and discussed the procedure including the risks, benefits and alternatives for the proposed anesthesia with the patient or authorized representative who has indicated his/her understanding and acceptance.     Dental advisory given  Plan Discussed with: CRNA  Anesthesia Plan Comments:         Anesthesia Quick Evaluation

## 2020-11-14 NOTE — Progress Notes (Signed)
PT Cancellation Note  Patient Details Name: CHERRILL SCRIMA MRN: [id number] DOB: 07-17-1946   Cancelled Treatment:    Reason Eval/Treat Not Completed: Patient at procedure or test/unavailable OTF and unavailable for PT. Will continue efforts as time/schedule allow.    Windell Norfolk, DPT, PN1   Supplemental Physical Therapist [facility]    Pager [phone] Acute Rehab Office [phone]

## 2020-11-14 NOTE — Progress Notes (Addendum)
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 fib on Eliquis as an outpatient  Follows with Dr. Bryan Lemma in the outpatient setting-also gets recurrent Thoracentesis for recurrent effusions on the right side by Dr. Silas Flood  Return to the emergency room 12/20 dark tarry stools being lightheaded fatigue epigastric discomfort Found to have hemoglobin 5.7 patient was hypotensive initially-given 1 unit of blood blood pressure above 100 Given Kcentra Protonix octreotide Dr. Havery Moros consulted for likely upper GI bleed   Assessment & Plan:   Active Problems:   Atrial fibrillation, chronic (HCC)   Essential hypertension   Sleep apnea   ESRD on dialysis (HCC)   Pleural effusion   Upper GI bleed   Chronic systolic CHF (congestive heart failure) (HCC)   Acute blood loss anemia   Chronic anticoagulation    Probable upper GI bleed with hypotension Continues to have dark stools although they are formed-strict n.p.o. Patient transfused 1 unit PRBC 12/20 We will give another unit 12/21 Received Kcentra to reverse anticoagulation with Eliquis Hypovolemic shock secondary to acute blood loss anemia 250 cc bolus given 12/21 given map below 60 Monitor trends Discussed with GI to see more urgently given bleeding she does have some chronic hypotensionAt baseline so we will also give midodrine if she goes to dialysis today Continue octreotide after blood is given and then give also PPI twice daily IVGoing for scope today after my discussion with Dr. Herbert Seta heart failure EF 30% Chronic  atrial fibrillation  previously on Eliquis Not on rate controlling agent Cardiology consulted but given rapid A. fib have elected to load the patient with 200 mg twice daily of amiodarone No other options at this juncture Hopefully with volume patient will become more rate controlled Cirrhosis liver Pleural effusions Effusions are likely secondary to her cirrhosis which she has been seen GI about Gastroenterology asking to give ceftriaxone which has been ordered Repeat chest x-ray not emergently in a.m. keep on oxygen at this time ESRD MWF Nephrology made aware of need transfusions and probably volume removal  DVT prophylaxis: SCD Code Status: Partial Family Communication: None Disposition:  Status is: Inpatient  Remains inpatient appropriate because:Hemodynamically unstable, Persistent severe electrolyte disturbances, Altered mental status and Ongoing diagnostic testing needed not appropriate for outpatient work up   Dispo: The patient is from: Home              Anticipated d/c is to: Unclear at this time              Anticipated d/c date is: 2 days              Patient currently is not medically stable to d/c.       Consultants:   Gastroenterology  Nephrology  Procedures: None yet  Antimicrobials: Ceftriaxone   Subjective: Feels fair little bit weak and short of breath Tells me has not had any rate controlling agent Past dark stool earlier today No chest pain no fever Feels a little swollen lower extremities No nausea no vomiting  Objective: Vitals:   11/14/20 0400 11/14/20 0715 11/14/20 0730 11/14/20 0745  BP: 107/71 (!) 70/54 (!) 92/54 (!) 75/43  Pulse: Marland Kitchen)  125 (!) 116 65 (!) 124  Resp: (!) 30 20 16 14   Temp:      TempSrc:      SpO2: 100% 100% 100% 97%    Intake/Output Summary (Last 24 hours) at 11/14/2020 0800 Last data filed at 11/13/2020 1945 Gross per 24 hour  Intake 300 ml  Output --  Net 300 ml   There were no vitals filed for this  visit.  Examination:  EOMI NCAT looks about stated age no icterus no pallor Chest clear no added sound S1-S2 irregularly irregular rhythm on monitors heart rate 120s Abdomen soft but slightly tender in upper quadrant Trace lower extremity edema Rectal deferred ROM intact neurologically intact  Data Reviewed: I have personally reviewed following labs and imaging studies Sodium 135 BUNs/creatinine 46/4.1 Albumin 3.0 Bilirubin 1.9 White count 9.1 hemoglobin up from 5.7-7.3 platelets 146 INR 1.4  Radiology Studies: DG CHEST PORT 1 VIEW  Result Date: 11/13/2020 CLINICAL DATA:  Chest pain and shortness of breath. EXAM: PORTABLE CHEST 1 VIEW COMPARISON:  Chest x-ray 11/07/2020 FINDINGS: The heart size and mediastinal contours are unchanged with cardiomegaly. Aortic calcifications. No focal consolidation. No pulmonary edema. Interval development of an at least small right pleural effusion. No left pleural effusion. No pneumothorax. No acute osseous abnormality. IMPRESSION: 1. Small right pleural effusion. 2. Marked cardiomegaly. Electronically Signed   By: Iven Finn M.D.   On: 11/13/2020 21:23     Scheduled Meds: . sodium chloride   Intravenous Once  . amiodarone  200 mg Oral BID  . cinacalcet  30 mg Oral Q supper  . [START ON 11/15/2020] midodrine  10 mg Oral Q M,W,F-HD  . montelukast  10 mg Oral Daily  . octreotide  50 mcg Intravenous Once  . pantoprazole (PROTONIX) IV  40 mg Intravenous Q12H  . sodium chloride flush  3 mL Intravenous Q12H   Continuous Infusions: . sodium chloride    . sodium chloride    . sodium chloride    . cefTRIAXone (ROCEPHIN)  IV    . octreotide  (SANDOSTATIN)    IV infusion Stopped (11/14/20 0052)     LOS: 1 day    Time spent: [place], MD [facility] To contact the attending provider between 7A-7P or the covering provider during after hours 7P-7A, please log into the web site [email or web address] and access using universal  [place] password for that web site. If you do not have the password, please call the hospital operator.  11/14/2020, 8:00 AM

## 2020-11-14 NOTE — Interval H&P Note (Signed)
History and Physical Interval Note:  11/14/2020 1:31 PM  Melissa Adams  has presented today for surgery, with the diagnosis of upper gi bleed, melena.  The various methods of treatment have been discussed with the patient and family. After consideration of risks, benefits and other options for treatment, the patient has consented to  Procedure(s): ESOPHAGOGASTRODUODENOSCOPY (EGD) WITH PROPOFOL (Left) as a surgical intervention.  The patient's history has been reviewed, patient examined, no change in status, stable for surgery.  I have reviewed the patient's chart and labs.  Questions were answered to the patient's satisfaction.     [employer]

## 2020-11-14 NOTE — H&P (View-Only) (Signed)
Consultation  Referring Provider:     Dr. Verlon Au Primary Care Physician:  [name], NP Primary Gastroenterologist:        Dr. Bryan Lemma Reason for Consultation:     Upper GI bleed         HPI:   Melissa Adams is a 74 y.o. female with a history of A. fib on Eliquis, end-stage renal disease on HD, CHF, cirrhosis, who presented to the ED yesterday with fatigue and melena.  She states she has had dark tarry stools for the past 3 days or so, getting worse over time.  She denies any bright red blood per rectum.  No nausea or vomiting.  She has had some epigastric pain for the past few days.  Due to progressive fatigue she came to the emergency room yesterday.  She denies any NSAID use.  She states she is chronically in atrial fibrillation denies any history of a stroke.  She does have a history of a pleural effusion for which she underwent thoracentesis last week.  No evidence of infection in the fluid.  Her baseline hemoglobin is 11.7 as of October of this year.  She presented with a hemoglobin of 5.7.  She received 2 units of packed red blood cells and her hemoglobin at 3:30 AM was 7.3.  She states she feels significantly improved since admission.  She was tachycardic with atrial fibrillation and relative hypotension upon admission.  Given her end-stage renal disease and Eliquis use and that this will take several days to flush out of her system with her hypotension yesterday, she was given Kcentra to reverse the Eliquis.  Platelets 143, INR 1.4.  She denies any blood per rectum overnight.  She remained in atrial fibrillation with heart rate in the low 962E, systolic BPs in 36O.  Dr. Verlon Au has ordered another unit of packed red blood cells this morning.  Of note she has had a difficultly IV access.  Protonix and octreotide were both ordered last night, it appears she is only had Protonix infusion given she has only 1 IV.  She has no known history of esophageal varices, but does not  have a recent screening EGD for this.  She denies any history of internal bleeding.  Per review of chart she had a colonoscopy in [place] about 3 years ago which was reportedly normal.  She had a few polyps removed at the age of 56 on a remote colonoscopy.  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  She had a serologic work-up for other chronic causes of liver disease which were negative.  Her ejection fraction is 30 to 35%.  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  She does not use alcohol.  Past Medical History:  Diagnosis Date  . A-fib ([place])   . Allergies   . Arthritis   . Asthma   . Cardiomyopathy ([place])   . CHF (congestive heart failure) ([place])   . Chronic kidney disease   . Edema   . History of hemodialysis   . Hyperlipidemia   . Hypertension   . Pulmonary hypertension ([place])   . Sleep apnea   . Tricuspid regurgitation   Cirrhosis  Past Surgical History:  Procedure Laterality Date  . A/V FISTULAGRAM Left 05/23/2020   Procedure: A/V FISTULAGRAM;  Surgeon: [name], MD;  Location: [place] [facility];  Service: Cardiovascular;  Laterality: Left;  . ABDOMINAL HYSTERECTOMY    .  AV FISTULA PLACEMENT Left 11/04/2019   Procedure: Insertion Of Arteriovenous (Av) Gore-Tex Graft Arm;  Surgeon: [name], MD;  Location: [place];  Service: Vascular;  Laterality: Left;  . CARDIAC CATHETERIZATION    . CARDIOVERSION N/A 10/26/2019   Procedure: CARDIOVERSION;  Surgeon: [name], MD;  Location: [facility] ENDOSCOPY;  Service: Cardiovascular;  Laterality: N/A;  . COLONOSCOPY W/ BIOPSIES AND POLYPECTOMY    . FISTULA SUPERFICIALIZATION Left 01/21/2020   Procedure: FISTULA SUPERFICIALIZATION OF FISTULA;  Surgeon: [name], MD;  Location: [place];  Service: Vascular;  Laterality: Left;  . INSERTION OF DIALYSIS CATHETER Right 11/04/2019   Procedure: CONVERT TEMPORARY DIALYSIS CATHETER TO  TUNNELED DIALYSIS CATHETER Right Internal Jugular.;  Surgeon: [name], MD;  Location: [place];  Service: Vascular;  Laterality: Right;  . IR THORACENTESIS ASP PLEURAL SPACE W/IMG GUIDE  08/17/2020  . MULTIPLE TOOTH EXTRACTIONS    . REVISION OF ARTERIOVENOUS GORETEX GRAFT Left 01/21/2020   Procedure: REVISION OF ARTERIOVENOUS FISTULA WITH SIDE BRANCH LIGATION;  Surgeon: [name], MD;  Location: [place];  Service: Vascular;  Laterality: Left;  . TEE WITHOUT CARDIOVERSION N/A 10/26/2019   Procedure: TRANSESOPHAGEAL ECHOCARDIOGRAM (TEE);  Surgeon: [name], MD;  Location: [facility] ENDOSCOPY;  Service: Cardiovascular;  Laterality: N/A;  . THORACENTESIS     x 2  . THORACENTESIS N/A 11/07/2020   Procedure: Mathews Robinsons;  Surgeon: [name], MD;  Location: [facility] ENDOSCOPY;  Service: Pulmonary;  Laterality: N/A;    Family History  Problem Relation Age of Onset  . Seizures Father   . CAD Father   . Diabetes Sister   . Lupus Sister   . Hypertension Sister   . Colon cancer Neg Hx   . Esophageal cancer Neg Hx   . Pancreatic cancer Neg Hx   . Stomach cancer Neg Hx      Social History   Tobacco Use  . Smoking status: Former Smoker    Types: Cigarettes  . Smokeless tobacco: Never Used  Vaping Use  . Vaping Use: Never used  Substance Use Topics  . Alcohol use: Not Currently  . Drug use: Not Currently    Prior to Admission medications   Medication Sig Start Date End Date Taking? Authorizing Provider  acetaminophen (TYLENOL) 325 MG tablet Take 2 tablets (650 mg total) by mouth every 4 (four) hours as needed for headache or mild pain. 11/05/19  Yes Buriev, Arie Sabina, MD  albuterol (VENTOLIN HFA) 108 (90 Base) MCG/ACT inhaler Inhale 2 puffs into the lungs every 6 (six) hours as needed for wheezing or shortness of breath. 11/30/19  Yes [name], NP  allopurinol (ZYLOPRIM) 300 MG tablet Take 1 tablet (300 mg total) by mouth at bedtime. 06/14/20  Yes [name], NP  apixaban (ELIQUIS) 5 MG TABS tablet Take 1 tablet (5 mg total) by mouth 2 (two) times daily. 12/15/19  Yes [name], MD  atorvastatin (LIPITOR) 80 MG tablet Take 1 tablet (80 mg total) by mouth at bedtime. 08/22/20  Yes [name], MD  cetirizine (ZYRTEC) 10 MG tablet Take 10 mg by mouth daily as needed for allergies.   Yes [provider]  cinacalcet (SENSIPAR) 30 MG tablet Take 30 mg by mouth daily with supper.  04/18/20  Yes [provider]  ferric citrate (AURYXIA) 1 GM 210 MG(Fe) tablet Take 210 mg by mouth 3 (three) times daily with meals.   Yes [provider]  furosemide (  LASIX) 40 MG tablet Take 80 mg by mouth See admin instructions. Take 2 tablets (80 mg) by mouth twice daily on non-dialysis days - Sunday, Tuesday, Thursday   Yes [provider]  lidocaine-prilocaine (EMLA) cream Apply 1 application topically See admin instructions. Apply topically to port access one hour prior to dialysis - Monday, Wednesday, Friday, Saturday 04/07/20  Yes [provider]  midodrine (PROAMATINE) 10 MG tablet Take 10 mg by mouth See admin instructions. Take one tablet (10 mg) by mouth on Monday, Wednesday, Friday, Saturday prior to dialysis 02/10/20  Yes [provider]  montelukast (SINGULAIR) 10 MG tablet Take 1 tablet by mouth once daily Patient taking differently: Take 10 mg by mouth daily. 08/05/20  Yes [name], NP  multivitamin (RENA-VIT) TABS tablet Take 1 tablet by mouth daily.   Yes [provider]  vitamin B-12 (CYANOCOBALAMIN) 1000 MCG tablet Take 1,000 mcg by mouth daily.   Yes [provider]  vitamin C (VITAMIN C) 500 MG tablet Take 1 tablet (500 mg total) by mouth daily. 11/06/19  Yes Kinnie Feil, MD    Current Facility-Administered Medications  Medication Dose Route Frequency Provider Last Rate Last Admin  . 0.9 %  sodium chloride infusion  10 mL/hr Intravenous Once Doutova,  Anastassia, MD      . 0.9 %  sodium chloride infusion  10 mL/hr Intravenous Once Doutova, Anastassia, MD      . 0.9 %  sodium chloride infusion  250 mL Intravenous PRN Doutova, Anastassia, MD      . acetaminophen (TYLENOL) tablet 650 mg  650 mg Oral Q6H PRN Doutova, Anastassia, MD       Or  . acetaminophen (TYLENOL) suppository 650 mg  650 mg Rectal Q6H PRN Doutova, Anastassia, MD      . albuterol (VENTOLIN HFA) 108 (90 Base) MCG/ACT inhaler 2 puff  2 puff Inhalation Q6H PRN Doutova, Anastassia, MD      . atorvastatin (LIPITOR) tablet 80 mg  80 mg Oral QHS Doutova, Anastassia, MD      . cinacalcet (SENSIPAR) tablet 30 mg  30 mg Oral Q supper Toy Baker, MD      . Derrill Memo ON 11/15/2020] midodrine (PROAMATINE) tablet 10 mg  10 mg Oral Q M,W,F-HD Doutova, Anastassia, MD      . montelukast (SINGULAIR) tablet 10 mg  10 mg Oral Daily Doutova, Anastassia, MD      . octreotide (SANDOSTATIN) 2 mcg/mL load via infusion 50 mcg  50 mcg Intravenous Once Doutova, Anastassia, MD       And  . octreotide (SANDOSTATIN) 500 mcg in sodium chloride 0.9 % 250 mL (2 mcg/mL) infusion  50 mcg/hr Intravenous Continuous Toy Baker, MD   Held at 11/14/20 0052  . pantoprazole (PROTONIX) 80 mg in sodium chloride 0.9 % 100 mL (0.8 mg/mL) infusion  8 mg/hr Intravenous Continuous Doutova, Anastassia, MD 10 mL/hr at 11/13/20 2338 8 mg/hr at 11/13/20 2338  . sodium chloride 0.9 % bolus 250 mL  250 mL Intravenous Once Nita Sells, MD      . sodium chloride flush (NS) 0.9 % injection 3 mL  3 mL Intravenous Q12H Doutova, Anastassia, MD      . sodium chloride flush (NS) 0.9 % injection 3 mL  3 mL Intravenous PRN Toy Baker, MD       Current Outpatient Medications  Medication Sig Dispense Refill  . acetaminophen (TYLENOL) 325 MG tablet Take 2 tablets (650 mg total) by mouth every 4 (  four) hours as needed for headache or mild pain.    Marland Kitchen albuterol (VENTOLIN HFA) 108 (90 Base) MCG/ACT inhaler Inhale 2  puffs into the lungs every 6 (six) hours as needed for wheezing or shortness of breath. 6.7 g 1  . allopurinol (ZYLOPRIM) 300 MG tablet Take 1 tablet (300 mg total) by mouth at bedtime. 90 tablet 1  . apixaban (ELIQUIS) 5 MG TABS tablet Take 1 tablet (5 mg total) by mouth 2 (two) times daily. 60 tablet 11  . atorvastatin (LIPITOR) 80 MG tablet Take 1 tablet (80 mg total) by mouth at bedtime. 30 tablet 5  . cetirizine (ZYRTEC) 10 MG tablet Take 10 mg by mouth daily as needed for allergies.    . cinacalcet (SENSIPAR) 30 MG tablet Take 30 mg by mouth daily with supper.     . ferric citrate (AURYXIA) 1 GM 210 MG(Fe) tablet Take 210 mg by mouth 3 (three) times daily with meals.    . furosemide (LASIX) 40 MG tablet Take 80 mg by mouth See admin instructions. Take 2 tablets (80 mg) by mouth twice daily on non-dialysis days - Sunday, Tuesday, Thursday    . lidocaine-prilocaine (EMLA) cream Apply 1 application topically See admin instructions. Apply topically to port access one hour prior to dialysis - Monday, Wednesday, Friday, Saturday    . midodrine (PROAMATINE) 10 MG tablet Take 10 mg by mouth See admin instructions. Take one tablet (10 mg) by mouth on Monday, Wednesday, Friday, Saturday prior to dialysis    . montelukast (SINGULAIR) 10 MG tablet Take 1 tablet by mouth once daily (Patient taking differently: Take 10 mg by mouth daily.) 90 tablet 0  . multivitamin (RENA-VIT) TABS tablet Take 1 tablet by mouth daily.    . vitamin B-12 (CYANOCOBALAMIN) 1000 MCG tablet Take 1,000 mcg by mouth daily.    . vitamin C (VITAMIN C) 500 MG tablet Take 1 tablet (500 mg total) by mouth daily. 30 tablet 0    Allergies as of 11/13/2020 - Review Complete 11/13/2020  Allergen Reaction Noted  . Black walnut flavor Hives 03/21/2020  . Shellfish allergy Itching 03/21/2020     Review of Systems:    As per HPI, otherwise negative    Physical Exam:  Vital signs in last 24 hours: Temp:  [97.8 F (36.6 C)-98.3 F  (36.8 C)] 98.2 F (36.8 C) (12/20 2019) Pulse Rate:  [25-129] 124 (12/21 0745) Resp:  [14-30] 14 (12/21 0745) BP: (70-144)/(24-111) 75/43 (12/21 0745) SpO2:  [92 %-100 %] 97 % (12/21 0745)    BP cycled multiple times at bedside - 80s/50s, HR ranging from low 100s to 120  General:   Pleasant female  Lungs:  Respirations even and unlabored. Lungs clear to auscultation bilaterally.    Heart: irregularly irregular Abdomen:  Soft, nondistended, nontender.  Rectal:  Not performed. Per ED staff last night, grossly c/w melena Msk:  Symmetrical without gross deformities.  Extremities:  (+) 1 edema LE. Neurologic:  Alert and  oriented x4;  grossly normal neurologically. Skin:  Intact without significant lesions or rashes. Psych:  Alert and cooperative. Normal affect.  LAB RESULTS: Recent Labs    11/13/20 1318 11/14/20 0015 11/14/20 0328  WBC 7.9 9.4 9.1  HGB 5.7* 7.3* 7.3*  HCT 17.2* 22.8* 21.5*  PLT 146* 143* 146*   BMET Recent Labs    11/13/20 1318 11/14/20 0328  NA 138 135  K 3.5 4.1  CL 92* 91*  CO2 30 29  GLUCOSE 104* 103*  BUN 33* 46*  CREATININE 3.22* 4.17*  CALCIUM 7.7* 7.6*   LFT Recent Labs    11/14/20 0328  PROT 5.5*  ALBUMIN 3.0*  AST 35  ALT 29  ALKPHOS 123  BILITOT 1.9*   PT/INR Recent Labs    11/14/20 0015  LABPROT 16.6*  INR 1.4*    STUDIES: DG CHEST PORT 1 VIEW  Result Date: 11/13/2020 CLINICAL DATA:  Chest pain and shortness of breath. EXAM: PORTABLE CHEST 1 VIEW COMPARISON:  Chest x-ray 11/07/2020 FINDINGS: The heart size and mediastinal contours are unchanged with cardiomegaly. Aortic calcifications. No focal consolidation. No pulmonary edema. Interval development of an at least small right pleural effusion. No left pleural effusion. No pneumothorax. No acute osseous abnormality. IMPRESSION: 1. Small right pleural effusion. 2. Marked cardiomegaly. Electronically Signed   By: Iven Finn M.D.   On: 11/13/2020 21:23         Impression / Plan:   74 year old female with multiple comorbidities to include atrial fibrillation on Eliquis, end-stage renal disease on HD, CHF with EF 30 to 35%, cirrhosis, presenting with an upper GI bleed.  Symptomatic anemia with hemoglobin to the fives on admission, in the setting of melena for the past 3 days.  In the ED this morning she feels improved, Hgb up to 7.3.  She is in atrial fibrillation which she states is chronic, heart rate however has been in the low 100s with blood pressures fluctuating from the 44R to 15Q systolic. Baseline BP in our office in October was 82/42.   She is getting another unit of red blood cells this morning to further resuscitate.  She received Kcentra overnight given her Eliquis use and end-stage renal disease, as Eliquis would have otherwise remained in her system for several days and she potentially has a variceal bleed.  I discussed differential diagnosis with the patient.  She has never had an EGD for variceal screening and variceal bleed is possible, versus PUD, etc.  She warrants an endoscopy to further evaluate this.  I discussed risk benefits of endoscopy and anesthesia and she wants to proceed.  Question is the timing of this, will await her to start getting some blood, see if her will improve her hemodynamics with further resuscitation.  She will be kept n.p.o. hopefully we can do this procedure later this morning versus early afternoon.  If she has significant bleeding in the interim please let us know, it appears she has not had much output overnight per nursing.  Plan: - NPO - 1 unit RBC this AM - IV access is an issue. After RBC transfusion, would run in the octreotide drip (she has not had this yet in preference of IV protonix). You can dose the protonix 40mg  BID IV in the interim until she has better IV access - would give ceftriaxone for SBP prophylaxis in the setting of GI bleeding / ascites - EGD today with anesthesia assistance  Call with  questions, further recommendations pending her course and EGD.  Butner Cellar, MD [facility]

## 2020-11-14 NOTE — Op Note (Signed)
[facility] Patient Name: Melissa Adams Procedure Date : 11/14/2020 MRN: [id number] Attending MD: Carlota Raspberry. Havery Moros , MD Date of Birth: 09-15-1946 CSN: [id number] Age: 74 Admit Type: Inpatient Procedure:                Upper GI endoscopy Indications:              Melena, history of cirrhosis, anemia - s/p 3 units                            PRBC Providers:                Josie Dixon, RN, Ladona Ridgel, Technician,                            Luane School CRNA, Carlota Raspberry. Havery Moros, MD Referring MD:              Medicines:                Monitored Anesthesia Care Complications:            No immediate complications. Estimated blood loss:                            Minimal. Patient developed some hypotension when                            looking into the duodenum after biopsies taken, the                            procedure was aborted - managed per anesthesia Estimated Blood Loss:     Estimated blood loss was minimal. Procedure:                Pre-Anesthesia Assessment:                           - Prior to the procedure, a History and Physical                            was performed, and patient medications and                            allergies were reviewed. The patient's tolerance of                            previous anesthesia was also reviewed. The risks                            and benefits of the procedure and the sedation                            options and risks were discussed with the patient.                            All questions were answered, and informed consent  was obtained. Prior Anticoagulants: The patient has                            taken Eliquis (apixaban), last dose was 1 day prior                            to procedure. ASA Grade Assessment: III - A patient                            with severe systemic disease. After reviewing the                            risks and benefits, the patient was  deemed in                            satisfactory condition to undergo the procedure.                           After obtaining informed consent, the endoscope was                            passed under direct vision. Throughout the                            procedure, the patient's blood pressure, pulse, and                            oxygen saturations were monitored continuously. The                            GIF-H190 ([id number]) Olympus gastroscope was                            introduced through the mouth, and advanced to the                            second part of duodenum. The upper GI endoscopy was                            accomplished without difficulty. The patient                            tolerated the procedure well. Scope In: Scope Out: Findings:      The Z-line was regular.      The exam of the esophagus was otherwise normal. No esophageal varices.      A single small sessile polyp was found in the gastric body that was       benign in appearance - no biopsies taken given patient developed       hypotension during the case.      The exam of the stomach was otherwise normal. No gastric varices.      A single medium suspected subepithelial nodule was found in the second       portion of the duodenum. Biopsies were taken  with a cold forceps for       histology.      Benign appearing nodular mucosa was found in the duodenal bulb and in       the second portion of the duodenum, appeared to be benign peptic changes       - biopsies were going to be taken but were not when the patient       developed hypotension. Ampulla was not clearly seen.      The exam of the duodenum was otherwise normal. No heme Impression:               - Z-line regular.                           - Normal esophagus - no varices                           - A single gastric polyp.                           - Normal stomach - no varices                           - Benign nodule found in the  duodenum. Biopsied.                           - Nodular mucosa in the duodenal bulb and in the                            second portion of the duodenum.                           No cause for GI bleeding / melena on upper                            endoscopy. Suspect the patient is having small                            bowel bleed vs. slow right sided colonic bleed. She                            became hypotensive at the end of this case, managed                            per anesthesia, procedure was terminated. Recommendation:           - Return patient to hospital ward for ongoing care.                           - NPO.                           - Continue present medications.                           - Can stop octreotide - no varices on this exam                           -  Await pathology results.                           - Patient to recover in PACU with anesthesia, if                            hypotension persists after she awakens will need go                            to ICU for further care                           - She has not had brisk overt GI bleeding noted                            today - unclear cause of hypotension, could be                            active GI bleeding if we see more blood come out,                            but also has significant cardiac history, needs                            further evaluation. Spoke with Dr. Verlon Au primary                            service, critical care will evaluate the patient                           - CT angiogram of abdomen / pelvis to further                            evaluate for active bleeding, evaluate for source                            of bleeding, once stablized / resuscitated. Further                            recommendations pending that result Procedure Code(s):        --- Professional ---                           [phone], Esophagogastroduodenoscopy, flexible,                             transoral; with biopsy, single or multiple Diagnosis Code(s):        --- Professional ---                           K31.7, Polyp of stomach and duodenum                           K31.89, Other diseases of stomach and  duodenum                           K92.1, Melena (includes Hematochezia) CPT copyright 2019 American Medical Association. All rights reserved. The codes documented in this report are preliminary and upon coder review may  be revised to meet current compliance requirements. Remo Lipps P. Amelianna Meller, MD 11/14/2020 2:41:54 PM This report has been signed electronically. Number of Addenda: 0

## 2020-11-14 NOTE — Transfer of Care (Deleted)
Immediate Anesthesia Transfer of Care Note  Patient: JEANNELLE WIENS  Procedure(s) Performed: ESOPHAGOGASTRODUODENOSCOPY (EGD) WITH PROPOFOL (Left ) BIOPSY  Patient Location: PACU  Anesthesia Type:MAC  Level of Consciousness: awake and patient cooperative  Airway & Oxygen Therapy: Patient Spontanous Breathing and Patient connected to nasal cannula oxygen  Post-op Assessment: Report given to RN and Post -op Vital signs reviewed and stable  Post vital signs: Reviewed  Last Vitals:  Vitals Value Taken Time  BP    Temp    Pulse 105 11/14/20 1441  Resp 21 11/14/20 1441  SpO2 91 % 11/14/20 1441  Vitals shown include unvalidated device data.  Last Pain:  Vitals:   11/14/20 1215  TempSrc: Temporal  PainSc:          Complications: No complications documented.

## 2020-11-14 NOTE — ED Notes (Signed)
Patient is resting comfortably. 

## 2020-11-14 NOTE — Consult Note (Addendum)
Cardiology Consultation:   Patient ID: ARLITA BUFFKIN; [id number]; 07-13-1946   Admit date: 11/13/2020 Date of Consult: 11/14/2020  Primary Care Provider: [name], NP Primary Cardiologist: Dr. [name], MD   Patient Profile:   Melissa Adams is a 74 y.o. female with a hx of chronic systolic HF with initial EF at 20-25% with improvement to 45% on most recent echo in [place], severe mitral and tricuspid regurgitation (scheduled to undergo mitral and tricuspid replacement surgery 10/13/19 in [place], [place]>>held due to biventricular failure), paroxysmal atrial fibrillation on Eliquis, OSA on CPAP, HTN, ESRD with HD MWF, cirrohsis with chronic pleural effusions with repeat thoracentesis, and chronic hypotension on midodrine who is being seen today for the evaluation of atrial fibrillation and acute CHF at the request of Dr. Verlon Au.  History of Present Illness:   Ms. Bossi is a 74yo with a hx as stated above who presented to [facility] on 11/12/20 with fatigue and melena. She reported that she had been having dark, tarry stools for three days prior to presentation. She had no bright red bleeding. There was no N/V but she reported progressive fatigue along with DOE. She called her PCP who then referred her to the ED for further evaluation. She is on chronic anticoagulation with Eliquis which she has been taking for approximately one year. Prior to that she was treated with Coumadin.   On ED arrival, she was hypotensive with SBPs in the 70's therefore she received IVF bolus with BP improvement. Fecal occult was positive and Hb was down to 5.7 with a baseline in the 11.0 range. She received Protonix and was transfused with 1 unit PRBCs (3 total). She was also started on octreotide. She was given Kcentra the reverse anticoagulation with Eliquis. GI was consulted as well who recommends proceeding with EGD for further evaluation.  Unfortunately she went into RVR felt to be secondary to  hypovolemic shock and probable upper GI bleed. Given this, IM has asked [place] to follow along. She received Amiodarone 200mg  BID for amiodarone load.   She is followed by Dr. Aundra Dubin for her cardiology care and was last seen 08/15/20. She had been hospitalized previously for SOB and weight gain found to be markedly fluid volume overloaded. She was found to be COVID positive. She also had issues with AF with RVR. Echocardiogram at that time showed an EF at 20-25%. She was started on milrinone and was diuresed. Given aggressive diuresis, she had issues with AKI that was ultimately treated with CVVH without recovery and was subsequently transitioned to HD. She underwent TEE/DCCV for her AF but she did not maintain NSR. She also underwent multiple thoracentesis for chronic pleural effusions felt to be secondary to hepatic congestion from biventricular heart failure.  Echocardiogram 03/2020 showed a small improvement in LV function to 30-35% with a D-shaped septum with moderately dilated RV/mildly decreased systolic function, PASP 43, moderate MR, moderate TR. Prior [facility] 07/2019 with no significant coronary disease.   On last follow up she appeared fluid volume up despite HD. Sleep study performed showed OSA and she was waiting on her CPAP. She was noted to be SOB on ambulation with 47ft with chronic orthopnea. She takes Lasix on non-HD days. It was recommended that she may need to increase her HD days to 4 times per week.   Today I am seeing her post EGD in PACU. She reports that she has been feeling well from a CV standpoint prior to admission. She used to  live in [place] and was following cardiology there in reference to her valvular disease and atrial fibrillation. She has since moved to [place] where she lives with her daughter.    Past Medical History:  Diagnosis Date  . A-fib ([place])   . Allergies   . Arthritis   . Asthma   . Cardiomyopathy ([place])   . CHF (congestive heart failure) ([place])   . Chronic kidney disease    . Edema   . History of hemodialysis   . Hyperlipidemia   . Hypertension   . Pulmonary hypertension ([place])   . Sleep apnea   . Tricuspid regurgitation     Past Surgical History:  Procedure Laterality Date  . A/V FISTULAGRAM Left 05/23/2020   Procedure: A/V FISTULAGRAM;  Surgeon: [name], MD;  Location: [place] [facility];  Service: Cardiovascular;  Laterality: Left;  . ABDOMINAL HYSTERECTOMY    . AV FISTULA PLACEMENT Left 11/04/2019   Procedure: Insertion Of Arteriovenous (Av) Gore-Tex Graft Arm;  Surgeon: [name], MD;  Location: [place];  Service: Vascular;  Laterality: Left;  . CARDIAC CATHETERIZATION    . CARDIOVERSION N/A 10/26/2019   Procedure: CARDIOVERSION;  Surgeon: [name], MD;  Location[facility] ENDOSCOPY;  Service: Cardiovascular;  Laterality: N/A;  . COLONOSCOPY W/ BIOPSIES AND POLYPECTOMY    . FISTULA SUPERFICIALIZATION Left 01/21/2020   Procedure: FISTULA SUPERFICIALIZATION OF FISTULA;  Surgeon: [name], MD;  Location: [place];  Service: Vascular;  Laterality: Left;  . INSERTION OF DIALYSIS CATHETER Right 11/04/2019   Procedure: CONVERT TEMPORARY DIALYSIS CATHETER TO TUNNELED DIALYSIS CATHETER Right Internal Jugular.;  Surgeon: [name], MD;  Location: [place];  Service: Vascular;  Laterality: Right;  . IR THORACENTESIS ASP PLEURAL SPACE W/IMG GUIDE  08/17/2020  . MULTIPLE TOOTH EXTRACTIONS    . REVISION OF ARTERIOVENOUS GORETEX GRAFT Left 01/21/2020   Procedure: REVISION OF ARTERIOVENOUS FISTULA WITH SIDE BRANCH LIGATION;  Surgeon: [name], MD;  Location: [place];  Service: Vascular;  Laterality: Left;  . TEE WITHOUT CARDIOVERSION N/A 10/26/2019   Procedure: TRANSESOPHAGEAL ECHOCARDIOGRAM (TEE);  Surgeon: [name], MD;  Location: [facility] ENDOSCOPY;  Service: Cardiovascular;  Laterality: N/A;  . THORACENTESIS     x 2  . THORACENTESIS N/A 11/07/2020   Procedure: Mathews Robinsons;  Surgeon: [name], MD;   Location: [facility] ENDOSCOPY;  Service: Pulmonary;  Laterality: N/A;     Prior to Admission medications   Medication Sig Start Date End Date Taking? Authorizing Provider  acetaminophen (TYLENOL) 325 MG tablet Take 2 tablets (650 mg total) by mouth every 4 (four) hours as needed for headache or mild pain. 11/05/19  Yes Buriev, Arie Sabina, MD  albuterol (VENTOLIN HFA) 108 (90 Base) MCG/ACT inhaler Inhale 2 puffs into the lungs every 6 (six) hours as needed for wheezing or shortness of breath. 11/30/19  Yes [name], NP  allopurinol (ZYLOPRIM) 300 MG tablet Take 1 tablet (300 mg total) by mouth at bedtime. 06/14/20  Yes [name], NP  apixaban (ELIQUIS) 5 MG TABS tablet Take 1 tablet (5 mg total) by mouth 2 (two) times daily. 12/15/19  Yes [name], MD  atorvastatin (LIPITOR) 80 MG tablet Take 1 tablet (80 mg total) by mouth at bedtime. 08/22/20  Yes [name], MD  cetirizine (ZYRTEC) 10 MG tablet Take 10 mg by mouth daily as needed for allergies.   Yes [provider]  cinacalcet (SENSIPAR) 30 MG tablet Take 30 mg  by mouth daily with supper.  04/18/20  Yes [provider]  ferric citrate (AURYXIA) 1 GM 210 MG(Fe) tablet Take 210 mg by mouth 3 (three) times daily with meals.   Yes [provider]  furosemide (LASIX) 40 MG tablet Take 80 mg by mouth See admin instructions. Take 2 tablets (80 mg) by mouth twice daily on non-dialysis days - Sunday, Tuesday, Thursday   Yes [provider]  lidocaine-prilocaine (EMLA) cream Apply 1 application topically See admin instructions. Apply topically to port access one hour prior to dialysis - Monday, Wednesday, Friday, Saturday 04/07/20  Yes [provider]  midodrine (PROAMATINE) 10 MG tablet Take 10 mg by mouth See admin instructions. Take one tablet (10 mg) by mouth on Monday, Wednesday, Friday, Saturday prior to dialysis 02/10/20  Yes [provider]  montelukast (SINGULAIR) 10 MG tablet  Take 1 tablet by mouth once daily Patient taking differently: Take 10 mg by mouth daily. 08/05/20  Yes [name], NP  multivitamin (RENA-VIT) TABS tablet Take 1 tablet by mouth daily.   Yes [provider]  vitamin B-12 (CYANOCOBALAMIN) 1000 MCG tablet Take 1,000 mcg by mouth daily.   Yes [provider]  vitamin C (VITAMIN C) 500 MG tablet Take 1 tablet (500 mg total) by mouth daily. 11/06/19  Yes Kinnie Feil, MD    Inpatient Medications: Scheduled Meds: . sodium chloride   Intravenous Once  . amiodarone  200 mg Oral BID  . Chlorhexidine Gluconate Cloth  6 each Topical Q0600  . cinacalcet  30 mg Oral Q supper  . [START ON 11/15/2020] midodrine  10 mg Oral Q M,W,F-HD  . montelukast  10 mg Oral Daily  . octreotide  50 mcg Intravenous Once  . pantoprazole (PROTONIX) IV  40 mg Intravenous Q12H  . sodium chloride flush  3 mL Intravenous Q12H   Continuous Infusions: . sodium chloride    . sodium chloride    . sodium chloride    . sodium chloride    . sodium chloride    . cefTRIAXone (ROCEPHIN)  IV    . octreotide  (SANDOSTATIN)    IV infusion Stopped (11/14/20 0052)   PRN Meds: sodium chloride, sodium chloride, sodium chloride, acetaminophen **OR** acetaminophen, albuterol, lidocaine (PF), lidocaine-prilocaine, pentafluoroprop-tetrafluoroeth, sodium chloride flush  Allergies:    Allergies  Allergen Reactions  . [employer]  . Shellfish Allergy Itching    Makes throat itch     Social History:   Social History   Socioeconomic History  . Marital status: Widowed    Spouse name: Not on file  . Number of children: Not on file  . Years of education: Not on file  . Highest education level: Not on file  Occupational History  . Not on file  Tobacco Use  . Smoking status: Former Smoker    Types: Cigarettes  . Smokeless tobacco: Never Used  Vaping Use  . Vaping Use: Never used  Substance and Sexual Activity  . Alcohol use: Not  Currently  . Drug use: Not Currently  . Sexual activity: Not Currently  Other Topics Concern  . Not on file  Social History Narrative  . Not on file   Social Determinants of Health   Financial Resource Strain: Not on file  Food Insecurity: Not on file  Transportation Needs: No Transportation Needs  . Lack of Transportation (Medical): No  . Lack of Transportation (Non-Medical): No  Physical Activity: Not on file  Stress: Not  on file  Social Connections: Socially Isolated  . Frequency of Communication with Friends and Family: More than three times a week  . Frequency of Social Gatherings with Friends and Family: Once a week  . Attends Religious Services: Never  . Active Member of Clubs or Organizations: No  . Attends Archivist Meetings: Never  . Marital Status: Widowed  Intimate Partner Violence: Not on file    Family History:   Family History  Problem Relation Age of Onset  . Seizures Father   . CAD Father   . Diabetes Sister   . Lupus Sister   . Hypertension Sister   . Colon cancer Neg Hx   . Esophageal cancer Neg Hx   . Pancreatic cancer Neg Hx   . Stomach cancer Neg Hx    Family Status:  Family Status  Relation Name Status  . Father  Deceased  . Sister  (Not Specified)  . Mother  Deceased  . Neg Hx  (Not Specified)    ROS:  Please see the history of present illness.  All other ROS reviewed and negative.     Physical Exam/Data:   Vitals:   11/14/20 0941 11/14/20 1000 11/14/20 1030 11/14/20 1045  BP: (!) 88/47 (!) 80/44 90/64 (!) 86/68  Pulse: (!) 104 (!) 44  66  Resp: (!) 23 17 16  (!) 21  Temp: 98.2 F (36.8 C)   98 F (36.7 C)  TempSrc:    Oral  SpO2:    95%    Intake/Output Summary (Last 24 hours) at 11/14/2020 1053 Last data filed at 11/13/2020 1945 Gross per 24 hour  Intake 300 ml  Output --  Net 300 ml   There were no vitals filed for this visit. There is no height or weight on file to calculate BMI.   General: Well  developed, well nourished, NAD Neck: Negative for carotid bruits. No JVD Lungs:Clear to ausculation bilaterally. No wheezes, rales, or rhonchi. Breathing is unlabored. Cardiovascular: Irregularly irregular. + murmur Abdomen: Soft, non-tender, non-distended. No obvious abdominal masses. Extremities: Mild BLE edema. Radial pulses 2+ bilaterally Neuro: Alert and oriented. No focal deficits. No facial asymmetry. MAE spontaneously. Psych: Responds to questions appropriately with normal affect.     EKG:  The EKG was personally reviewed and demonstrates:  11/14/20 AF with HR at 117bpm  Telemetry:  Telemetry was personally reviewed and demonstrates:  11/14/20 AF with rates in the low 100's   Relevant CV Studies:  Echocardiogram 04/20/20:  Left ventricular ejection fraction, by estimation, is 30 to 35%. The left ventricle has moderate to severely decreased function. The left ventricle demonstrates global hypokinesis. The left ventricular internal cavity size was mildly dilated. Left ventricular diastolic parameters are indeterminate. 2. Right ventricular systolic function is mildly reduced. The right ventricular size is moderately enlarged. There is mildly elevated pulmonary artery systolic pressure. D-shaped interventricular septum consistent with RV pressure/volume overload. PA systolic pressure 43 mmHg. 3. Left atrial size was moderately dilated. 4. Right atrial size was moderately dilated. 5. The mitral valve is normal in structure. Moderate mitral valve regurgitation. No evidence of mitral stenosis. 6. Tricuspid valve regurgitation is moderate. 7. The aortic valve is tricuspid. Aortic valve regurgitation is not visualized. Mild aortic valve sclerosis is present, with no evidence of aortic valve stenosis. 8. The inferior vena cava is dilated in size with <50% respiratory variability, suggesting right atrial pressure of 15 mmHg.  LHC 08/05/19:  Normal coronary arteries   Laboratory  Data:  Chemistry  Recent Labs  Lab 11/13/20 1318 11/14/20 0328  NA 138 135  K 3.5 4.1  CL 92* 91*  CO2 30 29  GLUCOSE 104* 103*  BUN 33* 46*  CREATININE 3.22* 4.17*  CALCIUM 7.7* 7.6*  GFRNONAA 15* 11*  ANIONGAP 16* 15    Total Protein  Date Value Ref Range Status  11/14/2020 5.5 (L) 6.5 - 8.1 g/dL Final  06/22/2020 6.6 6.0 - 8.5 g/dL Final   Albumin  Date Value Ref Range Status  11/14/2020 3.0 (L) 3.5 - 5.0 g/dL Final  06/22/2020 4.2 3.7 - 4.7 g/dL Final   AST  Date Value Ref Range Status  11/14/2020 35 15 - 41 U/L Final   ALT  Date Value Ref Range Status  11/14/2020 29 0 - 44 U/L Final   Alkaline Phosphatase  Date Value Ref Range Status  11/14/2020 123 38 - 126 U/L Final   Total Bilirubin  Date Value Ref Range Status  11/14/2020 1.9 (H) 0.3 - 1.2 mg/dL Final   Bilirubin Total  Date Value Ref Range Status  06/22/2020 1.4 (H) 0.0 - 1.2 mg/dL Final   Hematology Recent Labs  Lab 11/14/20 0015 11/14/20 0328 11/14/20 0905  WBC 9.4 9.1 9.5  RBC 2.28* 2.19* 2.43*  HGB 7.3* 7.3* 7.8*  HCT 22.8* 21.5* 24.7*  MCV 100.0 98.2 101.6*  MCH 32.0 33.3 32.1  MCHC 32.0 34.0 31.6  RDW 17.9* 18.0* 18.7*  PLT 143* 146* 166   Cardiac EnzymesNo results for input(s): TROPONINI in the last 168 hours. No results for input(s): TROPIPOC in the last 168 hours.  BNPNo results for input(s): BNP, PROBNP in the last 168 hours.  DDimer No results for input(s): DDIMER in the last 168 hours. TSH:  Lab Results  Component Value Date   TSH 3.493 11/14/2020   Lipids: Lab Results  Component Value Date   CHOL 163 10/24/2020   HDL 60 10/24/2020   LDLCALC 93 10/24/2020   TRIG 51 10/24/2020   CHOLHDL 2.7 10/24/2020   HgbA1c: Lab Results  Component Value Date   HGBA1C 5.2 06/22/2020    Radiology/Studies:  DG CHEST PORT 1 VIEW  Result Date: 11/13/2020 CLINICAL DATA:  Chest pain and shortness of breath. EXAM: PORTABLE CHEST 1 VIEW COMPARISON:  Chest x-ray 11/07/2020  FINDINGS: The heart size and mediastinal contours are unchanged with cardiomegaly. Aortic calcifications. No focal consolidation. No pulmonary edema. Interval development of an at least small right pleural effusion. No left pleural effusion. No pneumothorax. No acute osseous abnormality. IMPRESSION: 1. Small right pleural effusion. 2. Marked cardiomegaly. Electronically Signed   By: Iven Finn M.D.   On: 11/13/2020 21:23   Assessment and Plan:   1. Acute on chronic CHF/nonischemic cardiomyopathy: -Has a known hx of non-ischemic cardiomyopathy with normal coronaries per LHC. Echo in [place] 07/2019 at 40-45% with LV reduction to 20-25% with dilated and severely dysfunctional RV with D-shaped interventricular septum in 2020. MR appeared moderate and TR appeared moderate on repeat echo 09/2019 felt to be secondary to AF with RVR versus COVID myocarditis. Volume managed with HD MWF and has struggled with fluid volume balance since being on HD.   -GDMT has been limited due to hypotension  -Consider repeat echo while inpatient  -HD for fluid management   2. Chronic atrial fibrillation: -Has a known hx of chronic atrial fibrillation on Eliquis therapy. She was previously treated with Coumadin however this was transitioned to Eliquis approximately 1 year ago. As above, she presented with melena ans  fatigue with a Hb at 5.7 treated with multiple PRBC transfusions. She was seen by GI and underwent an EGD -Unfortunately she was found to be in AF with RVR and was treated with PO Amiodarone 200mg  BID however due to EGD this was held for her AM dose 11/14/20 -Rates are currently in the low 100 range -Notes indicated that she was previously treated with amiodarone prior to cardioversion which was a failed attempt. Per Dr. Claris Gladden notes, likely would not hold NSR on future attempts.  -Eliquis on hold due to acute GI bleed -Rate controlling meds on hold due to hypotension  -Will let GI make final recommendations for  anticoagulation restart depending on final dx per colonoscopy.  -Will not continue amiodarone given relatively stable rates. Will try to resume beta blocker once BP are more stable.   3. Hypovolemic shock due to acute UGI bleed: -Pt presented to [facility] with melena and fatigue found to have a Hb of 5.7 treated with 2 units PRBCs (plans for and additional unit today) with some improvement in Hb to 7.2>>>7.8 -GI has been consulted who recommended EGD with pending results  -Management per IM, GI  3. ESRD: -HD on MWF -Nephrology has been notified per primary team   4. Chronic pleural effusions: -Effusions felt to be in the setting of congestive cirrhosis as she ruled out for other etiologies of liver disease  -Maintaining adequate saturations    For questions or updates, please contact [place] Please consult [email or web address] for contact info under Cardiology/STEMI.   SignedKathyrn Drown NP-C [facility] Pager: [phone] 11/14/2020 10:53 AM  The patient was seen, examined and discussed with Kathyrn Drown, NP  and I agree with the above.   74 y.o. female with a hx of chronic systolic HF with initial EF at 20-25% with improvement to 45% on most recent echo in [place], severe mitral and tricuspid regurgitation (scheduled to undergo mitral and tricuspid replacement surgery 10/13/19 in [place], [place]held due to biventricular failure), paroxysmal atrial fibrillation on Eliquis, OSA on CPAP, HTN, ESRD with HD MWF, cirrohsis with chronic pleural effusions with repeat thoracentesis, and chronic hypotension on midodrine who is being seen today for the evaluation of atrial fibrillation and acute CHF at the request of Dr. Verlon Au. She presented to [facility] on 11/12[facility]20 with fatigue and melena x 3 days, no bright red bleeding, no N/V. On arrival, she was hypotensive with SBPs in the 70's therefore she received IVF bolus with BP improvement. Fecal occult was positive and Hb was down to 5.7 with a baseline in the  11.0 range. She received Protonix and was transfused with 1 unit PRBCs (3 total). She was also started on octreotide. She was given Kcentra the reverse anticoagulation with Eliquis. GI was consulted as well who recommends proceeding with EGD for further evaluation. She went into RVR felt to be secondary to hypovolemic shock and probable upper GI bleed.   She is currently resting in bed, denies any palpitations no chest pain shortness of breath or dizziness.  She has no carotid bruit, irregular rate and rhythm, no murmur and clear lungs, warm extremities with good palpable pulses. Her blood pressure is 85/45 mmHg and her heart rates range from 95-1 15 when she was moving around.  Assessment and plan  Chronic persistent atrial fibrillation, now with RVR in the settings of severe anemia and hypovolemia, her baseline LVEF is 30 to 35%, her blood pressure currently is 85 systolic.  Patient's blood pressure tends to be around 90 and  she is asymptomatic. I would note start amiodarone as her heart rate is expected to be around 100 with the degree of anemia and severe LV dysfunction.  Considering transfusing 1-2 more units of red packed blood cells. Avoid any blood thinners right now, if a source of bleeding is identified and treated at tomorrow's colonoscopy we will consider restarting anticoagulation at a later date.  We will follow closely.  Ena Dawley, MD 11/14/2020

## 2020-11-14 NOTE — Consult Note (Addendum)
[facility] Renal Consultation Note    Indication for Consultation:  Management of ESRD/hemodialysis; anemia, hypertension/volume and secondary hyperparathyroidism PCP: [name], NP Nephrology-Dr. Moshe Cipro  HPI: Melissa Adams is a 74 y.o. female with ESRD on hemodialysis MWF at [facility]. PMH: HFrEF, HTN with intradialyic hypotension, TR, AFib on Eliquis, chronic recurrent pleural effusions,  anemia of chronic disease, SHPT. She is compliant with dialysis prescription. Last 11/13/2020. She left 1 kg above OP EDW.   Patient presented to ED 11/13/2020 with C/O black tarry stools, fatigue and feeling lightheaded. Upon arrival to ED, HGB was noted to be 5.7, FOBT +.  She received two units of PRBCs, KCentra for reversal of Eliquis. This AM HGB was 7.3. She received 3rd unit PRBCs, GI was consulted. She is scheduled for EGD later today.   Currently she is sitting up at bedside in ED. She denies SOB, says she feels better. She is being admitted for UGI bleed.  She agrees to extra HD treatment today for volume removal.   Past Medical History:  Diagnosis Date  . A-fib ([place])   . Allergies   . Arthritis   . Asthma   . Cardiomyopathy ([place])   . CHF (congestive heart failure) ([place])   . Chronic kidney disease   . Edema   . History of hemodialysis   . Hyperlipidemia   . Hypertension   . Pulmonary hypertension ([place])   . Sleep apnea   . Tricuspid regurgitation    Past Surgical History:  Procedure Laterality Date  . A/V FISTULAGRAM Left 05/23/2020   Procedure: A/V FISTULAGRAM;  Surgeon: [name], MD;  Location: [facility];  Service: Cardiovascular;  Laterality: Left;  . ABDOMINAL HYSTERECTOMY    . AV FISTULA PLACEMENT Left 11/04/2019   Procedure: Insertion Of Arteriovenous (Av) Gore-Tex Graft Arm;  Surgeon: [name], MD;  Location: [place];  Service: Vascular;  Laterality: Left;  . CARDIAC CATHETERIZATION    .  CARDIOVERSION N/A 10/26/2019   Procedure: CARDIOVERSION;  Surgeon: [name], MD;  Location[facility] ENDOSCOPY;  Service: Cardiovascular;  Laterality: N/A;  . COLONOSCOPY W/ BIOPSIES AND POLYPECTOMY    . FISTULA SUPERFICIALIZATION Left 01/21/2020   Procedure: FISTULA SUPERFICIALIZATION OF FISTULA;  Surgeon: [name], MD;  Location: [place];  Service: Vascular;  Laterality: Left;  . INSERTION OF DIALYSIS CATHETER Right 11/04/2019   Procedure: CONVERT TEMPORARY DIALYSIS CATHETER TO TUNNELED DIALYSIS CATHETER Right Internal Jugular.;  Surgeon: [name], MD;  Location: [place];  Service: Vascular;  Laterality: Right;  . IR THORACENTESIS ASP PLEURAL SPACE W/IMG GUIDE  08/17/2020  . MULTIPLE TOOTH EXTRACTIONS    . REVISION OF ARTERIOVENOUS GORETEX GRAFT Left 01/21/2020   Procedure: REVISION OF ARTERIOVENOUS FISTULA WITH SIDE BRANCH LIGATION;  Surgeon: [name], MD;  Location: [place];  Service: Vascular;  Laterality: Left;  . TEE WITHOUT CARDIOVERSION N/A 10/26/2019   Procedure: TRANSESOPHAGEAL ECHOCARDIOGRAM (TEE);  Surgeon: [name], MD;  Location: [facility] ENDOSCOPY;  Service: Cardiovascular;  Laterality: N/A;  . THORACENTESIS     x 2  . THORACENTESIS N/A 11/07/2020   Procedure: Mathews Robinsons;  Surgeon: [name], MD;  Location: [facility] ENDOSCOPY;  Service: Pulmonary;  Laterality: N/A;   Family History  Problem Relation Age of Onset  . Seizures Father   . CAD Father   . Diabetes Sister   . Lupus Sister   . Hypertension Sister   . Colon cancer Neg Hx   .  Esophageal cancer Neg Hx   . Pancreatic cancer Neg Hx   . Stomach cancer Neg Hx    Social History:  reports that she has quit smoking. Her smoking use included cigarettes. She has never used smokeless tobacco. She reports previous alcohol use. She reports previous drug use. Allergies  Allergen Reactions  . [employer]  . Shellfish Allergy Itching    Makes throat itch    Prior to  Admission medications   Medication Sig Start Date End Date Taking? Authorizing Provider  acetaminophen (TYLENOL) 325 MG tablet Take 2 tablets (650 mg total) by mouth every 4 (four) hours as needed for headache or mild pain. 11/05/19  Yes Buriev, Arie Sabina, MD  albuterol (VENTOLIN HFA) 108 (90 Base) MCG/ACT inhaler Inhale 2 puffs into the lungs every 6 (six) hours as needed for wheezing or shortness of breath. 11/30/19  Yes [name], NP  allopurinol (ZYLOPRIM) 300 MG tablet Take 1 tablet (300 mg total) by mouth at bedtime. 06/14/20  Yes [name], NP  apixaban (ELIQUIS) 5 MG TABS tablet Take 1 tablet (5 mg total) by mouth 2 (two) times daily. 12/15/19  Yes [name], MD  atorvastatin (LIPITOR) 80 MG tablet Take 1 tablet (80 mg total) by mouth at bedtime. 08/22/20  Yes [name], MD  cetirizine (ZYRTEC) 10 MG tablet Take 10 mg by mouth daily as needed for allergies.   Yes [provider]  cinacalcet (SENSIPAR) 30 MG tablet Take 30 mg by mouth daily with supper.  04/18/20  Yes [provider]  ferric citrate (AURYXIA) 1 GM 210 MG(Fe) tablet Take 210 mg by mouth 3 (three) times daily with meals.   Yes [provider]  furosemide (LASIX) 40 MG tablet Take 80 mg by mouth See admin instructions. Take 2 tablets (80 mg) by mouth twice daily on non-dialysis days - Sunday, Tuesday, Thursday   Yes [provider]  lidocaine-prilocaine (EMLA) cream Apply 1 application topically See admin instructions. Apply topically to port access one hour prior to dialysis - Monday, Wednesday, Friday, Saturday 04/07/20  Yes [provider]  midodrine (PROAMATINE) 10 MG tablet Take 10 mg by mouth See admin instructions. Take one tablet (10 mg) by mouth on Monday, Wednesday, Friday, Saturday prior to dialysis 02/10/20  Yes [provider]  montelukast (SINGULAIR) 10 MG tablet Take 1 tablet by mouth once daily Patient taking differently: Take 10 mg by  mouth daily. 08/05/20  Yes [name], NP  multivitamin (RENA-VIT) TABS tablet Take 1 tablet by mouth daily.   Yes [provider]  vitamin B-12 (CYANOCOBALAMIN) 1000 MCG tablet Take 1,000 mcg by mouth daily.   Yes [provider]  vitamin C (VITAMIN C) 500 MG tablet Take 1 tablet (500 mg total) by mouth daily. 11/06/19  Yes Kinnie Feil, MD   Current Facility-Administered Medications  Medication Dose Route Frequency Provider Last Rate Last Admin  . [MAR Hold] 0.9 %  sodium chloride infusion (Manually program via Guardrails IV Fluids)   Intravenous Once Nita Sells, MD      . Doug Sou Hold] 0.9 %  sodium chloride infusion  10 mL/hr Intravenous Once Toy Baker, MD      . Doug Sou Hold] 0.9 %  sodium chloride infusion  10 mL/hr Intravenous Once Toy Baker, MD      . Doug Sou Hold] 0.9 %  sodium chloride infusion  250 mL Intravenous PRN Toy Baker, MD      . [  MAR Hold] 0.9 %  sodium chloride infusion  100 mL Intravenous PRN Madelon Lips, MD      . Doug Sou Hold] 0.9 %  sodium chloride infusion  100 mL Intravenous PRN Madelon Lips, MD      . Doug Sou Hold] acetaminophen (TYLENOL) tablet 650 mg  650 mg Oral Q6H PRN Toy Baker, MD       Or  . Doug Sou Hold] acetaminophen (TYLENOL) suppository 650 mg  650 mg Rectal Q6H PRN Toy Baker, MD      . Doug Sou Hold] albuterol (VENTOLIN HFA) 108 (90 Base) MCG/ACT inhaler 2 puff  2 puff Inhalation Q6H PRN Toy Baker, MD      . Doug Sou Hold] amiodarone (PACERONE) tablet 200 mg  200 mg Oral BID Nita Sells, MD      . Doug Sou Hold] cefTRIAXone (ROCEPHIN) 1 g in sodium chloride 0.9 % 100 mL IVPB  1 g Intravenous Q24H Nita Sells, MD      . Doug Sou Hold] Chlorhexidine Gluconate Cloth 2 % PADS 6 each  6 each Topical Q0600 Madelon Lips, MD      . Doug Sou Hold] cinacalcet (SENSIPAR) tablet 30 mg  30 mg Oral Q supper Toy Baker, MD      . Doug Sou Hold] lidocaine (PF) (XYLOCAINE) 1  % injection 5 mL  5 mL Intradermal PRN Madelon Lips, MD      . Doug Sou Hold] lidocaine-prilocaine (EMLA) cream 1 application  1 application Topical PRN Madelon Lips, MD      . Doug Sou Hold] midodrine (PROAMATINE) tablet 10 mg  10 mg Oral Q M,W,F-HD Toy Baker, MD      . Doug Sou Hold] montelukast (SINGULAIR) tablet 10 mg  10 mg Oral Daily Doutova, Nyoka Lint, MD      . Doug Sou Hold] octreotide (SANDOSTATIN) 2 mcg/mL load via infusion 50 mcg  50 mcg Intravenous Once Toy Baker, MD       And  . Doug Sou Hold] octreotide (SANDOSTATIN) 500 mcg in sodium chloride 0.9 % 250 mL (2 mcg/mL) infusion  50 mcg/hr Intravenous Continuous Toy Baker, MD   Held at 11/14/20 0052  . [MAR Hold] pantoprazole (PROTONIX) injection 40 mg  40 mg Intravenous Q12H Yetta Flock, MD   40 mg at 11/14/20 6270  . [MAR Hold] pentafluoroprop-tetrafluoroeth (GEBAUERS) aerosol 1 application  1 application Topical PRN Madelon Lips, MD      . Doug Sou Hold] sodium chloride flush (NS) 0.9 % injection 3 mL  3 mL Intravenous Q12H Doutova, Anastassia, MD      . Doug Sou Hold] sodium chloride flush (NS) 0.9 % injection 3 mL  3 mL Intravenous PRN Toy Baker, MD       Facility-Administered Medications Ordered in Other Encounters  Medication Dose Route Frequency Provider Last Rate Last Admin  . 0.9 %  sodium chloride infusion   Intravenous Continuous PRN Renato Shin, CRNA 200 mL/hr at 11/14/20 1353 New Bag at 11/14/20 1353  . lidocaine 2% (20 mg/mL) 5 mL syringe   Intravenous Anesthesia Intra-op Renato Shin, CRNA   60 mg at 11/14/20 1357  . PHENYLephrine 40 mcg/ml in normal saline Adult IV Push Syringe (For Blood Pressure Support)   Intravenous Anesthesia Intra-op Renato Shin, CRNA   120 mcg at 11/14/20 1417  . propofol (DIPRIVAN) 10 mg/mL bolus/IV push   Intravenous Anesthesia Intra-op Renato Shin, CRNA   25 mg at 11/14/20 1358  . propofol (DIPRIVAN) 500 MG/50ML infusion   Intravenous Continuous  PRN Renato Shin, CRNA  Stopped at 11/14/20 1413   Labs: Basic Metabolic Panel: Recent Labs  Lab 11/13/20 1318 11/14/20 0328  NA 138 135  K 3.5 4.1  CL 92* 91*  CO2 30 29  GLUCOSE 104* 103*  BUN 33* 46*  CREATININE 3.22* 4.17*  CALCIUM 7.7* 7.6*  PHOS  --  3.6   Liver Function Tests: Recent Labs  Lab 11/13/20 1318 11/14/20 0328  AST 35 35  ALT 28 29  ALKPHOS 115 123  BILITOT 1.5* 1.9*  PROT 5.8* 5.5*  ALBUMIN 3.1* 3.0*   No results for input(s): LIPASE, AMYLASE in the last 168 hours. No results for input(s): AMMONIA in the last 168 hours. CBC: Recent Labs  Lab 11/13/20 1318 11/14/20 0015 11/14/20 0328 11/14/20 0905  WBC 7.9 9.4 9.1 9.5  NEUTROABS  --   --  6.6  --   HGB 5.7* 7.3* 7.3* 7.8*  HCT 17.2* 22.8* 21.5* 24.7*  MCV 101.2* 100.0 98.2 101.6*  PLT 146* 143* 146* 166   Cardiac Enzymes: No results for input(s): CKTOTAL, CKMB, CKMBINDEX, TROPONINI in the last 168 hours. CBG: No results for input(s): GLUCAP in the last 168 hours. Iron Studies: No results for input(s): IRON, TIBC, TRANSFERRIN, FERRITIN in the last 72 hours. Studies/Results: DG CHEST PORT 1 VIEW  Result Date: 11/13/2020 CLINICAL DATA:  Chest pain and shortness of breath. EXAM: PORTABLE CHEST 1 VIEW COMPARISON:  Chest x-ray 11/07/2020 FINDINGS: The heart size and mediastinal contours are unchanged with cardiomegaly. Aortic calcifications. No focal consolidation. No pulmonary edema. Interval development of an at least small right pleural effusion. No left pleural effusion. No pneumothorax. No acute osseous abnormality. IMPRESSION: 1. Small right pleural effusion. 2. Marked cardiomegaly. Electronically Signed   By: Iven Finn M.D.   On: 11/13/2020 21:23    ROS: As per HPI otherwise negative.   Physical Exam: Vitals:   11/14/20 1045 11/14/20 1112 11/14/20 1130 11/14/20 1215  BP: (!) 86/68  (!) 98/39 102/78  Pulse: 66  (!) 121 (!) 102  Resp: (!) 21  16 18   Temp: 98 F (36.7 C)  98.1 F (36.7 C) (!) 97.3 F (36.3 C) (!) 97.2 F (36.2 C)  TempSrc: Oral Oral Temporal Temporal  SpO2: 95%  95% 99%     General: Chronically ill appearing elderly female in no acute distress. Head: Normocephalic, atraumatic, sclera non-icteric, mucus membranes are moist Neck: Supple. JVD 1/4 to mandible. Lungs: Bilateral breath sounds, decreased in bases, essentially clear to ausc. No WOB, no wheezing.  Heart: Irreg, irreg tachy with S1 S2. No murmurs, rubs, or gallops appreciated. Abdomen: Soft, non-tender, non-distended with normoactive bowel sounds. No rebound/guarding. No obvious abdominal masses. M-S:  Strength and tone appear normal for age. Lower extremities: Trace BLE edema Neuro: Alert and oriented X 3. Moves all extremities spontaneously. Psych:  Responds to questions appropriately with a normal affect. Dialysis Access: L AVF+ bruit. Thinning area lower portion of AVF and smaller area of same upper portion of AVF.   Dialysis Orders: Center: [facility] MWF 4 hr 180NRe 450/800 92 kg 2.0K/2.25 Ca UFP 2 L AVF -No heparin -Hectorol 1 mcg IV TIW -Mircera 60 mcg IV q 2 weeks (last dose 11/13/2020) -Venofer 50 mg IV weekly (Last dose 11/13/2020)   Assessment/Plan: 1.  Upper GIB-H/O tarry stools. HGB 5.7 on admission. Now S/P 3 units PRBCs. Last HGB 7.8. GI consulted. For EGD today. Per primary 2.  AFib on Eliquis-was given Kcentra for reversal of Eliquis. Anticoagulation on hold. No heparin with  HD.  3. HFrEF-last ECHO 04/21/19 EF 30-35% with global hypokinesis. Volume managed with HD. Mildly reduced RV function. Moderate TR/MR regurgitation.  4.  Recurrent pleural effusions-followed by Pulmonary.  5.  ESRD -  MWF last HD 12/20 left 1 kg above OP EDW, has received multiple units of blood and IVF. Will have short HD today for volume removal, resume MWF schedule tomorrow.  6. Ulcerations on AVF/Issues with bleeding post HD-VVS consulted.  7.  Hypertension/volume  - No overt volume excess  by exam. Trace BLE edema, CXR 12/20 without evidence of pulmonary edema but left above OP EDW 12/20 and had received blood products and NS here. UF today as tolerated. BP soft and variable. Uses midodrine 10 mg PO TIW with HD.  8.  Anemia  - As noted in # 1. Follow HGB, transfuse as needed.  9.  Metabolic bone disease - OP labs all at goal 11/08/2020. At goal here.  Continue VDRA, binders when able to eat. 10.  Nutrition -Albumin has fallen now 3.0. Add protein supps, renal vit when eating.   Lamia Mariner H. Owens Shark, NP-C 11/14/2020, 2:17 PM  [employer] [phone]

## 2020-11-14 NOTE — Transfer of Care (Signed)
Immediate Anesthesia Transfer of Care Note  Patient: Melissa Adams  Procedure(s) Performed: ESOPHAGOGASTRODUODENOSCOPY (EGD) WITH PROPOFOL (Left ) BIOPSY  Patient Location: PACU  Anesthesia Type:MAC  Level of Consciousness: awake and patient cooperative  Airway & Oxygen Therapy: Patient Spontanous Breathing and Patient connected to nasal cannula oxygen  Post-op Assessment: Report given to RN and Post -op Vital signs reviewed and stable  Post vital signs: Reviewed  Last Vitals:  Vitals Value Taken Time  BP    Temp    Pulse 110 11/14/20 1443  Resp 17 11/14/20 1443  SpO2 84 % 11/14/20 1443  Vitals shown include unvalidated device data.  Last Pain:  Vitals:   11/14/20 1215  TempSrc: Temporal  PainSc:          Complications: No complications documented.

## 2020-11-14 NOTE — Consult Note (Addendum)
[facility] NOTE   MRN : [id number]  Reason for Consult: difficult cannulation thinning skin left AV fistula Referring Physician: Nephrology Juanell Fairly  History of Present Illness: 74 y/o female with  left brachiocephalic fistula creation by Dr. Carlis Abbott 11/04/2019 that reported having bleeding after HD and difficult cannulation.  We have been consulted to examine the fistula.  She was returned to the OR for revision with sidebranch ligation and superficialization on 01/21/2020.  She was then returned to the [facility] lab for a fistulogram buy Dr. Trula Slade which showed patent flow in the fistula on 05/23/2020.  She presented to the [place] with a CC of possible GI bleed with black stools and light headed feeling.       Current Facility-Administered Medications  Medication Dose Route Frequency Provider Last Rate Last Admin  . [MAR Hold] 0.9 %  sodium chloride infusion (Manually program via Guardrails IV Fluids)   Intravenous Once Nita Sells, MD      . Doug Sou Hold] 0.9 %  sodium chloride infusion  10 mL/hr Intravenous Once Toy Baker, MD      . Doug Sou Hold] 0.9 %  sodium chloride infusion  10 mL/hr Intravenous Once Toy Baker, MD      . Doug Sou Hold] 0.9 %  sodium chloride infusion  250 mL Intravenous PRN Toy Baker, MD      . Doug Sou Hold] 0.9 %  sodium chloride infusion  100 mL Intravenous PRN Madelon Lips, MD      . Doug Sou Hold] 0.9 %  sodium chloride infusion  100 mL Intravenous PRN Madelon Lips, MD      . Doug Sou Hold] acetaminophen (TYLENOL) tablet 650 mg  650 mg Oral Q6H PRN Toy Baker, MD       Or  . Doug Sou Hold] acetaminophen (TYLENOL) suppository 650 mg  650 mg Rectal Q6H PRN Toy Baker, MD      . Doug Sou Hold] albuterol (VENTOLIN HFA) 108 (90 Base) MCG/ACT inhaler 2 puff  2 puff Inhalation Q6H PRN Toy Baker, MD      . Doug Sou Hold] amiodarone (PACERONE) tablet 200 mg  200 mg Oral BID Nita Sells, MD    200 mg at 11/14/20 1455  . [MAR Hold] cefTRIAXone (ROCEPHIN) 1 g in sodium chloride 0.9 % 100 mL IVPB  1 g Intravenous Q24H Nita Sells, MD      . Doug Sou Hold] Chlorhexidine Gluconate Cloth 2 % PADS 6 each  6 each Topical Q0600 Madelon Lips, MD      . Doug Sou Hold] cinacalcet (SENSIPAR) tablet 30 mg  30 mg Oral Q supper Toy Baker, MD      . Doug Sou Hold] lidocaine (PF) (XYLOCAINE) 1 % injection 5 mL  5 mL Intradermal PRN Madelon Lips, MD      . Doug Sou Hold] lidocaine-prilocaine (EMLA) cream 1 application  1 application Topical PRN Madelon Lips, MD      . Doug Sou Hold] midodrine (PROAMATINE) tablet 10 mg  10 mg Oral Q M,W,F-HD Toy Baker, MD      . Doug Sou Hold] montelukast (SINGULAIR) tablet 10 mg  10 mg Oral Daily Doutova, Nyoka Lint, MD      . Doug Sou Hold] octreotide (SANDOSTATIN) 2 mcg/mL load via infusion 50 mcg  50 mcg Intravenous Once Toy Baker, MD       And  . Doug Sou Hold] octreotide (SANDOSTATIN) 500 mcg in sodium chloride 0.9 % 250 mL (2 mcg/mL) infusion  50 mcg/hr Intravenous Continuous Toy Baker, MD   Held  at 11/14/20 0052  . [MAR Hold] pantoprazole (PROTONIX) injection 40 mg  40 mg Intravenous Q12H Yetta Flock, MD   40 mg at 11/14/20 2353  . [MAR Hold] pentafluoroprop-tetrafluoroeth (GEBAUERS) aerosol 1 application  1 application Topical PRN Madelon Lips, MD      . Doug Sou Hold] sodium chloride flush (NS) 0.9 % injection 3 mL  3 mL Intravenous Q12H Doutova, Anastassia, MD      . Doug Sou Hold] sodium chloride flush (NS) 0.9 % injection 3 mL  3 mL Intravenous PRN Doutova, Anastassia, MD        Pt meds include: Statin :Yes Betablocker: No ASA: No Other anticoagulants/antiplatelets: Eliquis on hold  Past Medical History:  Diagnosis Date  . A-fib ([place])   . Allergies   . Arthritis   . Asthma   . Cardiomyopathy ([place])   . CHF (congestive heart failure) ([place])   . Chronic kidney disease   . Edema   . History of hemodialysis   .  Hyperlipidemia   . Hypertension   . Pulmonary hypertension ([place])   . Sleep apnea   . Tricuspid regurgitation     Past Surgical History:  Procedure Laterality Date  . A/V FISTULAGRAM Left 05/23/2020   Procedure: A/V FISTULAGRAM;  Surgeon: [name], MD;  Location: [place] [facility];  Service: Cardiovascular;  Laterality: Left;  . ABDOMINAL HYSTERECTOMY    . AV FISTULA PLACEMENT Left 11/04/2019   Procedure: Insertion Of Arteriovenous (Av) Gore-Tex Graft Arm;  Surgeon: [name], MD;  Location: [place];  Service: Vascular;  Laterality: Left;  . CARDIAC CATHETERIZATION    . CARDIOVERSION N/A 10/26/2019   Procedure: CARDIOVERSION;  Surgeon: [name], MD;  Location: [facility] ENDOSCOPY;  Service: Cardiovascular;  Laterality: N/A;  . COLONOSCOPY W/ BIOPSIES AND POLYPECTOMY    . FISTULA SUPERFICIALIZATION Left 01/21/2020   Procedure: FISTULA SUPERFICIALIZATION OF FISTULA;  Surgeon: [name], MD;  Location: [place];  Service: Vascular;  Laterality: Left;  . INSERTION OF DIALYSIS CATHETER Right 11/04/2019   Procedure: CONVERT TEMPORARY DIALYSIS CATHETER TO TUNNELED DIALYSIS CATHETER Right Internal Jugular.;  Surgeon: [name], MD;  Location: [place];  Service: Vascular;  Laterality: Right;  . IR THORACENTESIS ASP PLEURAL SPACE W/IMG GUIDE  08/17/2020  . MULTIPLE TOOTH EXTRACTIONS    . REVISION OF ARTERIOVENOUS GORETEX GRAFT Left 01/21/2020   Procedure: REVISION OF ARTERIOVENOUS FISTULA WITH SIDE BRANCH LIGATION;  Surgeon: [name], MD;  Location: [place];  Service: Vascular;  Laterality: Left;  . TEE WITHOUT CARDIOVERSION N/A 10/26/2019   Procedure: TRANSESOPHAGEAL ECHOCARDIOGRAM (TEE);  Surgeon: [name], MD;  Location[facility]COPY;  Service: Cardiovascular;  Laterality: N/A;  . THORACENTESIS     x 2  . THORACENTESIS N/A 11/07/2020   Procedure: Mathews Robinsons;  Surgeon: [name], MD;  Location: [facility] ENDOSCOPY;  Service: Pulmonary;   Laterality: N/A;    Social History Social History   Tobacco Use  . Smoking status: Former Smoker    Types: Cigarettes  . Smokeless tobacco: Never Used  Vaping Use  . Vaping Use: Never used  Substance Use Topics  . Alcohol use: Not Currently  . Drug use: Not Currently    Family History Family History  Problem Relation Age of Onset  . Seizures Father   . CAD Father   . Diabetes Sister   . Lupus Sister   . Hypertension Sister   . Colon cancer Neg Hx   . Esophageal cancer Neg  Hx   . Pancreatic cancer Neg Hx   . Stomach cancer Neg Hx     Allergies  Allergen Reactions  . [employer]  . Shellfish Allergy Itching    Makes throat itch      REVIEW OF SYSTEMS  General: [ ]  Weight loss, [ ]  Fever, [ ]  chills Neurologic: [ ]  Dizziness, [ ]  Blackouts, [ ]  Seizure [ ]  Stroke, [ ]  "Mini stroke", [ ]  Slurred speech, [ ]  Temporary blindness; [ ]  weakness in arms or legs, [ ]  Hoarseness [ ]  Dysphagia Cardiac: [ ]  Chest pain/pressure, [ ]  Shortness of breath at rest [ ]  Shortness of breath with exertion, [x ] Atrial fibrillation or irregular heartbeat  Vascular: [ ]  Pain in legs with walking, [ ]  Pain in legs at rest, [ ]  Pain in legs at night,  [ ]  Non-healing ulcer, [ ]  Blood clot in vein/DVT,   Pulmonary: [ ]  Home oxygen, [ ]  Productive cough, [ ]  Coughing up blood, [ ]  Asthma,  [ ]  Wheezing [ ]  COPD Musculoskeletal:  [ ]  Arthritis, [ ]  Low back pain, [ ]  Joint pain Hematologic: [ ]  Easy Bruising, [ ]  Anemia; [ ]  Hepatitis Gastrointestinal: [ ]  Blood in stool, [ ]  Gastroesophageal Reflux/heartburn, Urinary: [ ]  chronic Kidney disease, [x ] on HD - [ ]  MWF or [ ]  TTHS, [ ]  Burning with urination, [ ]  Difficulty urinating Skin: [ ]  Rashes, [ ]  Wounds Psychological: [ ]  Anxiety, [ ]  Depression  Physical Examination Vitals:   11/14/20 1515 11/14/20 1530 11/14/20 1535 11/14/20 1538  BP: 93/60 (!) 83/61 (!) 85/59 (!) 85/59  Pulse: (!) 103 (!) 104 (!) 108 (!) 104   Resp: 15 15 14 14   Temp:      TempSrc:      SpO2: 94% 99% 94% 95%   There is no height or weight on file to calculate BMI.  General:  WDWN in NAD Gait: Normal HENT: WNL Eyes: Pupils equal Pulmonary: normal non-labored breathing , without Rales, rhonchi,  wheezing Cardiac: RRR, without  Murmurs, rubs or gallops; No carotid bruits Abdomen: soft, NT, no masses Skin: no rashes, ulcers noted;  no Gangrene , no cellulitis; no open wounds;   Vascular Exam/Pulses:Palpable radial pulses B, palpable thrill in the left UE fistula.  She has 2 areas on the fistula that demonstrate repeated sticks in the same area.  The are no ulcerations or bleeding near the area.   Musculoskeletal: no muscle wasting or atrophy; no edema  Neurologic: A&O X 3; Appropriate Affect ;  SENSATION: normal; MOTOR FUNCTION: 5/5 Symmetric Speech is fluent/normal   Significant Diagnostic Studies: CBC Lab Results  Component Value Date   WBC 9.5 11/14/2020   HGB 7.8 (L) 11/14/2020   HCT 24.7 (L) 11/14/2020   MCV 101.6 (H) 11/14/2020   PLT 166 11/14/2020    BMET    Component Value Date/Time   NA 135 11/14/2020 0328   NA 141 06/22/2020 1126   K 4.1 11/14/2020 0328   CL 91 (L) 11/14/2020 0328   CO2 29 11/14/2020 0328   GLUCOSE 103 (H) 11/14/2020 0328   BUN 46 (H) 11/14/2020 0328   BUN 28 (H) 06/22/2020 1126   CREATININE 4.17 (H) 11/14/2020 0328   CALCIUM 7.6 (L) 11/14/2020 0328   GFRNONAA 11 (L) 11/14/2020 0328   GFRAA 9 (L) 07/01/2020 1735   Estimated Creatinine Clearance: 13.3 mL/min (A) (by C-G formula based on SCr of 4.17 mg/dL (H)).  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   Please try to rotate stick sites.  We are available as needed.  Roxy Horseman 11/14/2020 3:51 PM  I have seen and evaluated the patient and agree with the above  assessment and plan.  Briefly this is a 74 year old female that we were asked to evaluate for thinning skin over top of her fistula as well as bleeding after dialysis.  She was seen for a similar problem back at the end of June and underwent a fistulogram that found her fistula to be widely patent without evidence of central stenosis.  She has a good thrill within her fistula and no active ulceration.  No intervention is planned at this time.  I would recommend accessing her fistula in a new location to avoid skin breakdown in the area of concern.  Annamarie Major

## 2020-11-14 NOTE — Consult Note (Signed)
Consultation  Referring Provider:     Dr. Verlon Au Primary Care Physician:  [name], NP Primary Gastroenterologist:        Dr. Bryan Lemma Reason for Consultation:     Upper GI bleed         HPI:   Melissa Adams is a 74 y.o. female with a history of A. fib on Eliquis, end-stage renal disease on HD, CHF, cirrhosis, who presented to the ED yesterday with fatigue and melena.  She states she has had dark tarry stools for the past 3 days or so, getting worse over time.  She denies any bright red blood per rectum.  No nausea or vomiting.  She has had some epigastric pain for the past few days.  Due to progressive fatigue she came to the emergency room yesterday.  She denies any NSAID use.  She states she is chronically in atrial fibrillation denies any history of a stroke.  She does have a history of a pleural effusion for which she underwent thoracentesis last week.  No evidence of infection in the fluid.  Her baseline hemoglobin is 11.7 as of October of this year.  She presented with a hemoglobin of 5.7.  She received 2 units of packed red blood cells and her hemoglobin at 3:30 AM was 7.3.  She states she feels significantly improved since admission.  She was tachycardic with atrial fibrillation and relative hypotension upon admission.  Given her end-stage renal disease and Eliquis use and that this will take several days to flush out of her system with her hypotension yesterday, she was given Kcentra to reverse the Eliquis.  Platelets 143, INR 1.4.  She denies any blood per rectum overnight.  She remained in atrial fibrillation with heart rate in the low 818E, systolic BPs in 99B.  Dr. Verlon Au has ordered another unit of packed red blood cells this morning.  Of note she has had a difficultly IV access.  Protonix and octreotide were both ordered last night, it appears she is only had Protonix infusion given she has only 1 IV.  She has no known history of esophageal varices, but does not  have a recent screening EGD for this.  She denies any history of internal bleeding.  Per review of chart she had a colonoscopy in [place] about 3 years ago which was reportedly normal.  She had a few polyps removed at the age of 20 on a remote colonoscopy.  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  She had a serologic work-up for other chronic causes of liver disease which were negative.  Her ejection fraction is 30 to 35%.  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  She does not use alcohol.  Past Medical History:  Diagnosis Date  . A-fib ([place])   . Allergies   . Arthritis   . Asthma   . Cardiomyopathy ([place])   . CHF (congestive heart failure) ([place])   . Chronic kidney disease   . Edema   . History of hemodialysis   . Hyperlipidemia   . Hypertension   . Pulmonary hypertension ([place])   . Sleep apnea   . Tricuspid regurgitation   Cirrhosis  Past Surgical History:  Procedure Laterality Date  . A/V FISTULAGRAM Left 05/23/2020   Procedure: A/V FISTULAGRAM;  Surgeon: [name], MD;  Location: [place] [facility];  Service: Cardiovascular;  Laterality: Left;  . ABDOMINAL HYSTERECTOMY    .  AV FISTULA PLACEMENT Left 11/04/2019   Procedure: Insertion Of Arteriovenous (Av) Gore-Tex Graft Arm;  Surgeon: [name], MD;  Location: [place];  Service: Vascular;  Laterality: Left;  . CARDIAC CATHETERIZATION    . CARDIOVERSION N/A 10/26/2019   Procedure: CARDIOVERSION;  Surgeon: [name], MD;  Location[facility] ENDOSCOPY;  Service: Cardiovascular;  Laterality: N/A;  . COLONOSCOPY W/ BIOPSIES AND POLYPECTOMY    . FISTULA SUPERFICIALIZATION Left 01/21/2020   Procedure: FISTULA SUPERFICIALIZATION OF FISTULA;  Surgeon: [name], MD;  Location: [place];  Service: Vascular;  Laterality: Left;  . INSERTION OF DIALYSIS CATHETER Right 11/04/2019   Procedure: CONVERT TEMPORARY DIALYSIS CATHETER TO  TUNNELED DIALYSIS CATHETER Right Internal Jugular.;  Surgeon: [name], MD;  Location: [place];  Service: Vascular;  Laterality: Right;  . IR THORACENTESIS ASP PLEURAL SPACE W/IMG GUIDE  08/17/2020  . MULTIPLE TOOTH EXTRACTIONS    . REVISION OF ARTERIOVENOUS GORETEX GRAFT Left 01/21/2020   Procedure: REVISION OF ARTERIOVENOUS FISTULA WITH SIDE BRANCH LIGATION;  Surgeon: [name], MD;  Location: [place];  Service: Vascular;  Laterality: Left;  . TEE WITHOUT CARDIOVERSION N/A 10/26/2019   Procedure: TRANSESOPHAGEAL ECHOCARDIOGRAM (TEE);  Surgeon: [name], MD;  Location: [facility] ENDOSCOPY;  Service: Cardiovascular;  Laterality: N/A;  . THORACENTESIS     x 2  . THORACENTESIS N/A 11/07/2020   Procedure: Mathews Robinsons;  Surgeon: [name], MD;  Location: [facility] ENDOSCOPY;  Service: Pulmonary;  Laterality: N/A;    Family History  Problem Relation Age of Onset  . Seizures Father   . CAD Father   . Diabetes Sister   . Lupus Sister   . Hypertension Sister   . Colon cancer Neg Hx   . Esophageal cancer Neg Hx   . Pancreatic cancer Neg Hx   . Stomach cancer Neg Hx      Social History   Tobacco Use  . Smoking status: Former Smoker    Types: Cigarettes  . Smokeless tobacco: Never Used  Vaping Use  . Vaping Use: Never used  Substance Use Topics  . Alcohol use: Not Currently  . Drug use: Not Currently    Prior to Admission medications   Medication Sig Start Date End Date Taking? Authorizing Provider  acetaminophen (TYLENOL) 325 MG tablet Take 2 tablets (650 mg total) by mouth every 4 (four) hours as needed for headache or mild pain. 11/05/19  Yes Buriev, Arie Sabina, MD  albuterol (VENTOLIN HFA) 108 (90 Base) MCG/ACT inhaler Inhale 2 puffs into the lungs every 6 (six) hours as needed for wheezing or shortness of breath. 11/30/19  Yes [name], NP  allopurinol (ZYLOPRIM) 300 MG tablet Take 1 tablet (300 mg total) by mouth at bedtime. 06/14/20  Yes [name], NP  apixaban (ELIQUIS) 5 MG TABS tablet Take 1 tablet (5 mg total) by mouth 2 (two) times daily. 12/15/19  Yes [name], MD  atorvastatin (LIPITOR) 80 MG tablet Take 1 tablet (80 mg total) by mouth at bedtime. 08/22/20  Yes [name], MD  cetirizine (ZYRTEC) 10 MG tablet Take 10 mg by mouth daily as needed for allergies.   Yes [provider]  cinacalcet (SENSIPAR) 30 MG tablet Take 30 mg by mouth daily with supper.  04/18/20  Yes [provider]  ferric citrate (AURYXIA) 1 GM 210 MG(Fe) tablet Take 210 mg by mouth 3 (three) times daily with meals.   Yes [provider]  furosemide (  LASIX) 40 MG tablet Take 80 mg by mouth See admin instructions. Take 2 tablets (80 mg) by mouth twice daily on non-dialysis days - Sunday, Tuesday, Thursday   Yes [provider]  lidocaine-prilocaine (EMLA) cream Apply 1 application topically See admin instructions. Apply topically to port access one hour prior to dialysis - Monday, Wednesday, Friday, Saturday 04/07/20  Yes [provider]  midodrine (PROAMATINE) 10 MG tablet Take 10 mg by mouth See admin instructions. Take one tablet (10 mg) by mouth on Monday, Wednesday, Friday, Saturday prior to dialysis 02/10/20  Yes [provider]  montelukast (SINGULAIR) 10 MG tablet Take 1 tablet by mouth once daily Patient taking differently: Take 10 mg by mouth daily. 08/05/20  Yes [name], NP  multivitamin (RENA-VIT) TABS tablet Take 1 tablet by mouth daily.   Yes [provider]  vitamin B-12 (CYANOCOBALAMIN) 1000 MCG tablet Take 1,000 mcg by mouth daily.   Yes [provider]  vitamin C (VITAMIN C) 500 MG tablet Take 1 tablet (500 mg total) by mouth daily. 11/06/19  Yes Kinnie Feil, MD    Current Facility-Administered Medications  Medication Dose Route Frequency Provider Last Rate Last Admin  . 0.9 %  sodium chloride infusion  10 mL/hr Intravenous Once Doutova,  Anastassia, MD      . 0.9 %  sodium chloride infusion  10 mL/hr Intravenous Once Doutova, Anastassia, MD      . 0.9 %  sodium chloride infusion  250 mL Intravenous PRN Doutova, Anastassia, MD      . acetaminophen (TYLENOL) tablet 650 mg  650 mg Oral Q6H PRN Doutova, Anastassia, MD       Or  . acetaminophen (TYLENOL) suppository 650 mg  650 mg Rectal Q6H PRN Doutova, Anastassia, MD      . albuterol (VENTOLIN HFA) 108 (90 Base) MCG/ACT inhaler 2 puff  2 puff Inhalation Q6H PRN Doutova, Anastassia, MD      . atorvastatin (LIPITOR) tablet 80 mg  80 mg Oral QHS Doutova, Anastassia, MD      . cinacalcet (SENSIPAR) tablet 30 mg  30 mg Oral Q supper Toy Baker, MD      . Derrill Memo ON 11/15/2020] midodrine (PROAMATINE) tablet 10 mg  10 mg Oral Q M,W,F-HD Doutova, Anastassia, MD      . montelukast (SINGULAIR) tablet 10 mg  10 mg Oral Daily Doutova, Anastassia, MD      . octreotide (SANDOSTATIN) 2 mcg/mL load via infusion 50 mcg  50 mcg Intravenous Once Doutova, Anastassia, MD       And  . octreotide (SANDOSTATIN) 500 mcg in sodium chloride 0.9 % 250 mL (2 mcg/mL) infusion  50 mcg/hr Intravenous Continuous Toy Baker, MD   Held at 11/14/20 0052  . pantoprazole (PROTONIX) 80 mg in sodium chloride 0.9 % 100 mL (0.8 mg/mL) infusion  8 mg/hr Intravenous Continuous Doutova, Anastassia, MD 10 mL/hr at 11/13/20 2338 8 mg/hr at 11/13/20 2338  . sodium chloride 0.9 % bolus 250 mL  250 mL Intravenous Once Nita Sells, MD      . sodium chloride flush (NS) 0.9 % injection 3 mL  3 mL Intravenous Q12H Doutova, Anastassia, MD      . sodium chloride flush (NS) 0.9 % injection 3 mL  3 mL Intravenous PRN Toy Baker, MD       Current Outpatient Medications  Medication Sig Dispense Refill  . acetaminophen (TYLENOL) 325 MG tablet Take 2 tablets (650 mg total) by mouth every 4 (  four) hours as needed for headache or mild pain.    Marland Kitchen albuterol (VENTOLIN HFA) 108 (90 Base) MCG/ACT inhaler Inhale 2  puffs into the lungs every 6 (six) hours as needed for wheezing or shortness of breath. 6.7 g 1  . allopurinol (ZYLOPRIM) 300 MG tablet Take 1 tablet (300 mg total) by mouth at bedtime. 90 tablet 1  . apixaban (ELIQUIS) 5 MG TABS tablet Take 1 tablet (5 mg total) by mouth 2 (two) times daily. 60 tablet 11  . atorvastatin (LIPITOR) 80 MG tablet Take 1 tablet (80 mg total) by mouth at bedtime. 30 tablet 5  . cetirizine (ZYRTEC) 10 MG tablet Take 10 mg by mouth daily as needed for allergies.    . cinacalcet (SENSIPAR) 30 MG tablet Take 30 mg by mouth daily with supper.     . ferric citrate (AURYXIA) 1 GM 210 MG(Fe) tablet Take 210 mg by mouth 3 (three) times daily with meals.    . furosemide (LASIX) 40 MG tablet Take 80 mg by mouth See admin instructions. Take 2 tablets (80 mg) by mouth twice daily on non-dialysis days - Sunday, Tuesday, Thursday    . lidocaine-prilocaine (EMLA) cream Apply 1 application topically See admin instructions. Apply topically to port access one hour prior to dialysis - Monday, Wednesday, Friday, Saturday    . midodrine (PROAMATINE) 10 MG tablet Take 10 mg by mouth See admin instructions. Take one tablet (10 mg) by mouth on Monday, Wednesday, Friday, Saturday prior to dialysis    . montelukast (SINGULAIR) 10 MG tablet Take 1 tablet by mouth once daily (Patient taking differently: Take 10 mg by mouth daily.) 90 tablet 0  . multivitamin (RENA-VIT) TABS tablet Take 1 tablet by mouth daily.    . vitamin B-12 (CYANOCOBALAMIN) 1000 MCG tablet Take 1,000 mcg by mouth daily.    . vitamin C (VITAMIN C) 500 MG tablet Take 1 tablet (500 mg total) by mouth daily. 30 tablet 0    Allergies as of 11/13/2020 - Review Complete 11/13/2020  Allergen Reaction Noted  . Black walnut flavor Hives 03/21/2020  . Shellfish allergy Itching 03/21/2020     Review of Systems:    As per HPI, otherwise negative    Physical Exam:  Vital signs in last 24 hours: Temp:  [97.8 F (36.6 C)-98.3 F  (36.8 C)] 98.2 F (36.8 C) (12/20 2019) Pulse Rate:  [25-129] 124 (12/21 0745) Resp:  [14-30] 14 (12/21 0745) BP: (70-144)/(24-111) 75/43 (12/21 0745) SpO2:  [92 %-100 %] 97 % (12/21 0745)    BP cycled multiple times at bedside - 80s/50s, HR ranging from low 100s to 120  General:   Pleasant female  Lungs:  Respirations even and unlabored. Lungs clear to auscultation bilaterally.    Heart: irregularly irregular Abdomen:  Soft, nondistended, nontender.  Rectal:  Not performed. Per ED staff last night, grossly c/w melena Msk:  Symmetrical without gross deformities.  Extremities:  (+) 1 edema LE. Neurologic:  Alert and  oriented x4;  grossly normal neurologically. Skin:  Intact without significant lesions or rashes. Psych:  Alert and cooperative. Normal affect.  LAB RESULTS: Recent Labs    11/13/20 1318 11/14/20 0015 11/14/20 0328  WBC 7.9 9.4 9.1  HGB 5.7* 7.3* 7.3*  HCT 17.2* 22.8* 21.5*  PLT 146* 143* 146*   BMET Recent Labs    11/13/20 1318 11/14/20 0328  NA 138 135  K 3.5 4.1  CL 92* 91*  CO2 30 29  GLUCOSE 104* 103*  BUN 33* 46*  CREATININE 3.22* 4.17*  CALCIUM 7.7* 7.6*   LFT Recent Labs    11/14/20 0328  PROT 5.5*  ALBUMIN 3.0*  AST 35  ALT 29  ALKPHOS 123  BILITOT 1.9*   PT/INR Recent Labs    11/14/20 0015  LABPROT 16.6*  INR 1.4*    STUDIES: DG CHEST PORT 1 VIEW  Result Date: 11/13/2020 CLINICAL DATA:  Chest pain and shortness of breath. EXAM: PORTABLE CHEST 1 VIEW COMPARISON:  Chest x-ray 11/07/2020 FINDINGS: The heart size and mediastinal contours are unchanged with cardiomegaly. Aortic calcifications. No focal consolidation. No pulmonary edema. Interval development of an at least small right pleural effusion. No left pleural effusion. No pneumothorax. No acute osseous abnormality. IMPRESSION: 1. Small right pleural effusion. 2. Marked cardiomegaly. Electronically Signed   By: Iven Finn M.D.   On: 11/13/2020 21:23         Impression / Plan:   74 year old female with multiple comorbidities to include atrial fibrillation on Eliquis, end-stage renal disease on HD, CHF with EF 30 to 35%, cirrhosis, presenting with an upper GI bleed.  Symptomatic anemia with hemoglobin to the fives on admission, in the setting of melena for the past 3 days.  In the ED this morning she feels improved, Hgb up to 7.3.  She is in atrial fibrillation which she states is chronic, heart rate however has been in the low 100s with blood pressures fluctuating from the 68T to 41D systolic. Baseline BP in our office in October was 82/42.   She is getting another unit of red blood cells this morning to further resuscitate.  She received Kcentra overnight given her Eliquis use and end-stage renal disease, as Eliquis would have otherwise remained in her system for several days and she potentially has a variceal bleed.  I discussed differential diagnosis with the patient.  She has never had an EGD for variceal screening and variceal bleed is possible, versus PUD, etc.  She warrants an endoscopy to further evaluate this.  I discussed risk benefits of endoscopy and anesthesia and she wants to proceed.  Question is the timing of this, will await her to start getting some blood, see if her will improve her hemodynamics with further resuscitation.  She will be kept n.p.o. hopefully we can do this procedure later this morning versus early afternoon.  If she has significant bleeding in the interim please let us know, it appears she has not had much output overnight per nursing.  Plan: - NPO - 1 unit RBC this AM - IV access is an issue. After RBC transfusion, would run in the octreotide drip (she has not had this yet in preference of IV protonix). You can dose the protonix 40mg  BID IV in the interim until she has better IV access - would give ceftriaxone for SBP prophylaxis in the setting of GI bleeding / ascites - EGD today with anesthesia assistance  Call with  questions, further recommendations pending her course and EGD.  Glen Alpine Cellar, MD [facility] Gastroenterology

## 2020-11-14 NOTE — Anesthesia Procedure Notes (Signed)
Procedure Name: MAC Date/Time: 11/14/2020 2:00 PM Performed by: Renato Shin, CRNA Pre-anesthesia Checklist: Patient identified, Emergency Drugs available, Suction available and Patient being monitored Patient Re-evaluated:Patient Re-evaluated prior to induction Oxygen Delivery Method: Nasal cannula Preoxygenation: Pre-oxygenation with 100% oxygen Induction Type: IV induction Placement Confirmation: positive ETCO2 and breath sounds checked- equal and bilateral Dental Injury: Teeth and Oropharynx as per pre-operative assessment

## 2020-11-15 ENCOUNTER — Inpatient Hospital Stay (HOSPITAL_COMMUNITY): Payer: Medicare HMO

## 2020-11-15 ENCOUNTER — Ambulatory Visit: Payer: Medicare HMO | Admitting: Gastroenterology

## 2020-11-15 DIAGNOSIS — D62 Acute posthemorrhagic anemia: Secondary | ICD-10-CM

## 2020-11-15 DIAGNOSIS — I482 Chronic atrial fibrillation, unspecified: Secondary | ICD-10-CM

## 2020-11-15 DIAGNOSIS — Z7901 Long term (current) use of anticoagulants: Secondary | ICD-10-CM

## 2020-11-15 DIAGNOSIS — I5022 Chronic systolic (congestive) heart failure: Secondary | ICD-10-CM

## 2020-11-15 DIAGNOSIS — K635 Polyp of colon: Secondary | ICD-10-CM

## 2020-11-15 LAB — TYPE AND SCREEN
ABO/RH(D): A POS
Antibody Screen: NEGATIVE
Unit division: 0
Unit division: 0
Unit division: 0

## 2020-11-15 LAB — CBC WITH DIFFERENTIAL/PLATELET
Abs Immature Granulocytes: 0.07 10*3/uL (ref 0.00–0.07)
Basophils Absolute: 0 10*3/uL (ref 0.0–0.1)
Basophils Relative: 0 %
Eosinophils Absolute: 0 10*3/uL (ref 0.0–0.5)
Eosinophils Relative: 0 %
HCT: 24.7 % — ABNORMAL LOW (ref 36.0–46.0)
Hemoglobin: 7.9 g/dL — ABNORMAL LOW (ref 12.0–15.0)
Immature Granulocytes: 1 %
Lymphocytes Relative: 16 %
Lymphs Abs: 0.9 10*3/uL (ref 0.7–4.0)
MCH: 32.1 pg (ref 26.0–34.0)
MCHC: 32 g/dL (ref 30.0–36.0)
MCV: 100.4 fL — ABNORMAL HIGH (ref 80.0–100.0)
Monocytes Absolute: 1.1 10*3/uL — ABNORMAL HIGH (ref 0.1–1.0)
Monocytes Relative: 17 %
Neutro Abs: 4 10*3/uL (ref 1.7–7.7)
Neutrophils Relative %: 66 %
Platelets: 128 10*3/uL — ABNORMAL LOW (ref 150–400)
RBC: 2.46 MIL/uL — ABNORMAL LOW (ref 3.87–5.11)
RDW: 18.7 % — ABNORMAL HIGH (ref 11.5–15.5)
WBC: 6.1 10*3/uL (ref 4.0–10.5)
nRBC: 3.8 % — ABNORMAL HIGH (ref 0.0–0.2)

## 2020-11-15 LAB — COMPREHENSIVE METABOLIC PANEL
ALT: 26 U/L (ref 0–44)
AST: 36 U/L (ref 15–41)
Albumin: 3.2 g/dL — ABNORMAL LOW (ref 3.5–5.0)
Alkaline Phosphatase: 100 U/L (ref 38–126)
Anion gap: 13 (ref 5–15)
BUN: 15 mg/dL (ref 8–23)
CO2: 27 mmol/L (ref 22–32)
Calcium: 7.7 mg/dL — ABNORMAL LOW (ref 8.9–10.3)
Chloride: 96 mmol/L — ABNORMAL LOW (ref 98–111)
Creatinine, Ser: 2.54 mg/dL — ABNORMAL HIGH (ref 0.44–1.00)
GFR, Estimated: 19 mL/min — ABNORMAL LOW (ref >60)
Glucose, Bld: 122 mg/dL — ABNORMAL HIGH (ref 70–99)
Potassium: 3.4 mmol/L — ABNORMAL LOW (ref 3.5–5.1)
Sodium: 136 mmol/L (ref 135–145)
Total Bilirubin: 1.4 mg/dL — ABNORMAL HIGH (ref 0.3–1.2)
Total Protein: 5.3 g/dL — ABNORMAL LOW (ref 6.5–8.1)

## 2020-11-15 LAB — BPAM RBC
Blood Product Expiration Date: 202201162359
Blood Product Expiration Date: 202201162359
Blood Product Expiration Date: 202201192359
ISSUE DATE / TIME: 202112201710
ISSUE DATE / TIME: 202112201954
ISSUE DATE / TIME: 202112210907
Unit Type and Rh: 6200
Unit Type and Rh: 6200
Unit Type and Rh: 6200

## 2020-11-15 LAB — PATHOLOGIST SMEAR REVIEW: Path Review: INCREASED

## 2020-11-15 MED ORDER — PANTOPRAZOLE SODIUM 40 MG PO TBEC
40.0000 mg | DELAYED_RELEASE_TABLET | Freq: Every day | ORAL | Status: DC
  Administered 2020-11-16 – 2020-11-19 (×4): 40 mg via ORAL
  Filled 2020-11-15 (×4): qty 1

## 2020-11-15 MED ORDER — PEG-KCL-NACL-NASULF-NA ASC-C 100 G PO SOLR
1.0000 | Freq: Once | ORAL | Status: AC
  Administered 2020-11-15: 200 g via ORAL
  Filled 2020-11-15: qty 1

## 2020-11-15 MED ORDER — METOCLOPRAMIDE HCL 5 MG/ML IJ SOLN
10.0000 mg | Freq: Once | INTRAMUSCULAR | Status: AC
  Administered 2020-11-15: 10 mg via INTRAVENOUS
  Filled 2020-11-15: qty 2

## 2020-11-15 NOTE — H&P (View-Only) (Signed)
Progress Note   Subjective  Patient has done well overnight. No further bowel movements. Hgb stable. BP has been back to baseline, appreciate cardiology evaluation   Objective   Vital signs in last 24 hours: Temp:  [97.2 F (36.2 C)-98.1 F (36.7 C)] 98 F (36.7 C) (12/22 0747) Pulse Rate:  [27-121] 100 (12/22 0747) Resp:  [10-31] 20 (12/22 0747) BP: (72-111)/(39-91) 85/45 (12/22 0747) SpO2:  [86 %-100 %] 97 % (12/22 0747) Weight:  [92.5 kg-94.5 kg] 92.5 kg (12/22 0213)   General:    AA female in NAD Abdomen:  Soft, nontender and nondistended. . Neurologic:  Alert and oriented,  grossly normal neurologically. Psych:  Cooperative. Normal mood and affect.  Intake/Output from previous day: 12/21 0701 - 12/22 0700 In: 538 [I.V.:200; Blood:338] Out: 1544  Intake/Output this shift: No intake/output data recorded.  Lab Results: Recent Labs    11/14/20 0328 11/14/20 0905 11/15/20 0122  WBC 9.1 9.5 6.1  HGB 7.3* 7.8* 7.9*  HCT 21.5* 24.7* 24.7*  PLT 146* 166 128*   BMET Recent Labs    11/13/20 1318 11/14/20 0328 11/15/20 0122  NA 138 135 136  K 3.5 4.1 3.4*  CL 92* 91* 96*  CO2 30 29 27   GLUCOSE 104* 103* 122*  BUN 33* 46* 15  CREATININE 3.22* 4.17* 2.54*  CALCIUM 7.7* 7.6* 7.7*   LFT Recent Labs    11/15/20 0122  PROT 5.3*  ALBUMIN 3.2*  AST 36  ALT 26  ALKPHOS 100  BILITOT 1.4*   PT/INR Recent Labs    11/14/20 0015  LABPROT 16.6*  INR 1.4*    Studies/Results: DG CHEST PORT 1 VIEW  Result Date: 11/15/2020 CLINICAL DATA:  Pneumonia.  Follow-up. EXAM: PORTABLE CHEST 1 VIEW COMPARISON:  11/13/2020 FINDINGS: Cardiomegaly as seen previously. Right effusion with volume loss in the right lower lung, similar to the previous exam. Left lung is clear. IMPRESSION: No change. Right effusion with volume loss in the right lower lung. Cardiomegaly. Electronically Signed   By: Nelson Chimes M.D.   On: 11/15/2020 08:11   DG CHEST PORT 1 VIEW  Result  Date: 11/13/2020 CLINICAL DATA:  Chest pain and shortness of breath. EXAM: PORTABLE CHEST 1 VIEW COMPARISON:  Chest x-ray 11/07/2020 FINDINGS: The heart size and mediastinal contours are unchanged with cardiomegaly. Aortic calcifications. No focal consolidation. No pulmonary edema. Interval development of an at least small right pleural effusion. No left pleural effusion. No pneumothorax. No acute osseous abnormality. IMPRESSION: 1. Small right pleural effusion. 2. Marked cardiomegaly. Electronically Signed   By: Iven Finn M.D.   On: 11/13/2020 21:23       Assessment / Plan:    74 year old female with multiple comorbidities to include atrial fibrillation on Eliquis, end-stage renal disease on HD, CHF, cirrhosis, who presented with GI bleed and symptomatic anemia. Eliquis reversed with K Centra on admission. Hgb initially in the 5s, received multiple RBCs, now stablized in the 7s. EGD yesterday looked okay - no blood in the lumen or cause for bleeding. Incidentally noted benign appearing duodenal nodule. The patient became hypotensive at the end of the case to 39J systolic, took multiple pressors to stabalize BP. She has not had any bleeding since admission, none yesterday, hypotension did not appear to be precipitated by bleeding. BP base to normal baseline in 80s. HR low 100s, in AF. Cardiology evaluation appreciated.  Unclear cause of bleeding right now - either significant small bowel bleed or right sided  colonic bleed. If she had colonic bleeding causing this level of output / anemia initially would think color would be brighter (she had dark melena on exam). She thinks she had a colonoscopy 3 years ago and maybe had some polyps removed. I discussed options with her. She has not had cross sectional imaging in some time, I think CT scan abdomen / pelvis with contrast is reasonable today to ensure no obvious small bowel pathology, ensure no mass lesions, with plans to for colonoscopy tomorrow  pending findings (if negative or obvious colonic cause). Spoke with nephrology, okay to give the patient IV contrast. Otherwise would continue ceftriaxone with ascites / cirrhosis for SBP prophylaxis in the setting of GIB. Can stop octreotide / PPI.   Plan: - CT abdomen / pelvis with contrast today - colonoscopy tomorrow AM, bowel prep tonight - monitor for recurrent bleeding / trend Hgb - clear liquids today until MN  Call with questions.  Darlington Cellar, MD [facility] Gastroenterology

## 2020-11-15 NOTE — Progress Notes (Signed)
Patient with yellow mews d/t heart rate, is up and down to [facility] undergoing bowel prep for coloscopy tomorrow. Patient has chronic atrial fibrillation and physicians are currently holding rate control meds d/t low BP. Patient's BP is now elevated so this RN has placed a page to on-call Cards physician. Will continue to monitor.

## 2020-11-15 NOTE — Progress Notes (Signed)
Progress Note   Subjective  Patient has done well overnight. No further bowel movements. Hgb stable. BP has been back to baseline, appreciate cardiology evaluation   Objective   Vital signs in last 24 hours: Temp:  [97.2 F (36.2 C)-98.1 F (36.7 C)] 98 F (36.7 C) (12/22 0747) Pulse Rate:  [27-121] 100 (12/22 0747) Resp:  [10-31] 20 (12/22 0747) BP: (72-111)/(39-91) 85/45 (12/22 0747) SpO2:  [86 %-100 %] 97 % (12/22 0747) Weight:  [92.5 kg-94.5 kg] 92.5 kg (12/22 0213)   General:    AA female in NAD Abdomen:  Soft, nontender and nondistended. . Neurologic:  Alert and oriented,  grossly normal neurologically. Psych:  Cooperative. Normal mood and affect.  Intake/Output from previous day: 12/21 0701 - 12/22 0700 In: 538 [I.V.:200; Blood:338] Out: 1544  Intake/Output this shift: No intake/output data recorded.  Lab Results: Recent Labs    11/14/20 0328 11/14/20 0905 11/15/20 0122  WBC 9.1 9.5 6.1  HGB 7.3* 7.8* 7.9*  HCT 21.5* 24.7* 24.7*  PLT 146* 166 128*   BMET Recent Labs    11/13/20 1318 11/14/20 0328 11/15/20 0122  NA 138 135 136  K 3.5 4.1 3.4*  CL 92* 91* 96*  CO2 30 29 27   GLUCOSE 104* 103* 122*  BUN 33* 46* 15  CREATININE 3.22* 4.17* 2.54*  CALCIUM 7.7* 7.6* 7.7*   LFT Recent Labs    11/15/20 0122  PROT 5.3*  ALBUMIN 3.2*  AST 36  ALT 26  ALKPHOS 100  BILITOT 1.4*   PT/INR Recent Labs    11/14/20 0015  LABPROT 16.6*  INR 1.4*    Studies/Results: DG CHEST PORT 1 VIEW  Result Date: 11/15/2020 CLINICAL DATA:  Pneumonia.  Follow-up. EXAM: PORTABLE CHEST 1 VIEW COMPARISON:  11/13/2020 FINDINGS: Cardiomegaly as seen previously. Right effusion with volume loss in the right lower lung, similar to the previous exam. Left lung is clear. IMPRESSION: No change. Right effusion with volume loss in the right lower lung. Cardiomegaly. Electronically Signed   By: Nelson Chimes M.D.   On: 11/15/2020 08:11   DG CHEST PORT 1 VIEW  Result  Date: 11/13/2020 CLINICAL DATA:  Chest pain and shortness of breath. EXAM: PORTABLE CHEST 1 VIEW COMPARISON:  Chest x-ray 11/07/2020 FINDINGS: The heart size and mediastinal contours are unchanged with cardiomegaly. Aortic calcifications. No focal consolidation. No pulmonary edema. Interval development of an at least small right pleural effusion. No left pleural effusion. No pneumothorax. No acute osseous abnormality. IMPRESSION: 1. Small right pleural effusion. 2. Marked cardiomegaly. Electronically Signed   By: Iven Finn M.D.   On: 11/13/2020 21:23       Assessment / Plan:    74 year old female with multiple comorbidities to include atrial fibrillation on Eliquis, end-stage renal disease on HD, CHF, cirrhosis, who presented with GI bleed and symptomatic anemia. Eliquis reversed with K Centra on admission. Hgb initially in the 5s, received multiple RBCs, now stablized in the 7s. EGD yesterday looked okay - no blood in the lumen or cause for bleeding. Incidentally noted benign appearing duodenal nodule. The patient became hypotensive at the end of the case to 81E systolic, took multiple pressors to stabalize BP. She has not had any bleeding since admission, none yesterday, hypotension did not appear to be precipitated by bleeding. BP base to normal baseline in 80s. HR low 100s, in AF. Cardiology evaluation appreciated.  Unclear cause of bleeding right now - either significant small bowel bleed or right sided  colonic bleed. If she had colonic bleeding causing this level of output / anemia initially would think color would be brighter (she had dark melena on exam). She thinks she had a colonoscopy 3 years ago and maybe had some polyps removed. I discussed options with her. She has not had cross sectional imaging in some time, I think CT scan abdomen / pelvis with contrast is reasonable today to ensure no obvious small bowel pathology, ensure no mass lesions, with plans to for colonoscopy tomorrow  pending findings (if negative or obvious colonic cause). Spoke with nephrology, okay to give the patient IV contrast. Otherwise would continue ceftriaxone with ascites / cirrhosis for SBP prophylaxis in the setting of GIB. Can stop octreotide / PPI.   Plan: - CT abdomen / pelvis with contrast today - colonoscopy tomorrow AM, bowel prep tonight - monitor for recurrent bleeding / trend Hgb - clear liquids today until MN  Call with questions.  [place]ott[place]rove Cellar, MD [facility]

## 2020-11-15 NOTE — Evaluation (Signed)
Physical Therapy Evaluation Patient Details Name: Melissa Adams MRN: [id number] DOB: 04/01/1946 Today's Date: 11/15/2020   History of Present Illness  74 y.o. female past medical history of A. fib (on Eliquis), CHF, CKD on HD MWF, hyperlipidemia, hypertension who presents for evaluation of melena x3 days as well as fatigue. Upper GI endoscopy reveals subepithelial nodule in second portion of duodenum, biopsied. Pt also with afib with RVR, cardiology following.  Clinical Impression   Pt presents with generalized weakness, impaired activity tolerance, and impaired standing balance vs baseline. Pt to benefit from acute PT to address deficits. Pt ambulated hallway distance without AD and close guard for safety, pt with episodes of unsteadiness which pt self-corrected. Pt reports she feels close to baseline, PT to continue to follow acutely but do not anticipate post-acute PT needs. PT to progress mobility as tolerated, and will continue to follow acutely.      Follow Up Recommendations No PT follow up;Supervision for mobility/OOB    Equipment Recommendations  None recommended by PT    Recommendations for Other Services       Precautions / Restrictions Precautions Precautions: Fall Precaution Comments: HR - in afib Restrictions Weight Bearing Restrictions: No      Mobility  Bed Mobility Overal bed mobility: Modified Independent             General bed mobility comments: up in chair upon PT arrival, requests back to recliner upon PT exit.    Transfers Overall transfer level: Needs assistance Equipment used: None Transfers: Sit to/from [facility] Sit to Stand: Supervision Stand pivot transfers: Supervision       General transfer comment: supervision for safety, slow to rise and steady.  Ambulation/Gait Ambulation/Gait assistance: Min guard Gait Distance (Feet): 85 Feet Assistive device: None Gait Pattern/deviations: Step-through  pattern;Decreased stride length;Drifts right/left Gait velocity: decr   General Gait Details: Min guard for safety, verbal cuing for upright posture. Lateral unsteadiness, especially when moving head during gait, pt able to correct balance herself.  Stairs            Wheelchair Mobility    Modified Rankin (Stroke Patients Only)       Balance Overall balance assessment: Needs assistance (good sitting balance, fair standing balance) Sitting-balance support: No upper extremity supported Sitting balance-Leahy Scale: Good     Standing balance support: Single extremity supported Standing balance-Leahy Scale: Fair                               Pertinent Vitals/Pain Pain Assessment: No/denies pain Faces Pain Scale: Hurts little more Pain Location: back, chronic Pain Descriptors / Indicators: Sore;Discomfort Pain Intervention(s): Limited activity within patient's tolerance;Monitored during session    Home Living Family/patient expects to be discharged to:: Private residence Living Arrangements: Children Available Help at Discharge: Family;Available 24 hours/day Type of Home: Apartment Home Access: Level entry     Home Layout: One level Home Equipment: Shower seat;Cane - single point      Prior Function Level of Independence: Independent with assistive device(s)         Comments: uses cane for extra support going to and from HD, son-in-law and daughter drive pt to dialysis.  Otherwise she is independent with ADL, meals and home management.     Hand Dominance   Dominant Hand: Right    Extremity/Trunk Assessment   Upper Extremity Assessment Upper Extremity Assessment: Overall WFL for tasks assessed LUE Deficits / Details:  dropping things with L hand this day, per pt report this is new. Strength WFL and symmetric to R when assessed LUE Coordination: decreased fine motor    Lower Extremity Assessment Lower Extremity Assessment: Defer to PT  evaluation    Cervical / Trunk Assessment Cervical / Trunk Assessment: Normal  Communication   Communication: No difficulties  Cognition Arousal/Alertness: Lethargic Behavior During Therapy: WFL for tasks assessed/performed Overall Cognitive Status: Within Functional Limits for tasks assessed                                        General Comments General comments (skin integrity, edema, etc.): HR in afib during session, HR 115-150 bpm during mobility. Pt asymptomatic    Exercises     Assessment/Plan    PT Assessment Patient needs continued PT services  PT Problem List Decreased strength;Decreased mobility;Decreased activity tolerance;Decreased balance;Pain       PT Treatment Interventions DME instruction;Therapeutic activities;Gait training;Therapeutic exercise;Patient/family education;Balance training;Functional mobility training;Neuromuscular re-education    PT Goals (Current goals can be found in the Care Plan section)  Acute Rehab PT Goals Patient Stated Goal: go home PT Goal Formulation: With patient Time For Goal Achievement: 11/29/20 Potential to Achieve Goals: Good    Frequency Min 3X/week   Barriers to discharge        Co-evaluation               AM-PAC PT "6 Clicks" Mobility  Outcome Measure Help needed turning from your back to your side while in a flat bed without using bedrails?: None Help needed moving from lying on your back to sitting on the side of a flat bed without using bedrails?: None Help needed moving to and from a bed to a chair (including a wheelchair)?: A Little Help needed standing up from a chair using your arms (e.g., wheelchair or bedside chair)?: A Little Help needed to walk in hospital room?: A Little Help needed climbing 3-5 steps with a railing? : A Little 6 Click Score: 20    End of Session   Activity Tolerance: Patient limited by fatigue Patient left: in chair;with call bell/phone within reach Nurse  Communication: Mobility status PT Visit Diagnosis: Other abnormalities of gait and mobility (R26.89);Difficulty in walking, not elsewhere classified (R26.2)    Time: [phone] ((10 minutes not included in chargeable time due to pt on phone)) PT Time Calculation (min) (ACUTE ONLY): 31 min   Charges:   PT Evaluation $PT Eval Low Complexity: 1 Low      Dama Hedgepeth S, PT Acute Rehabilitation Services Pager [phone]  Office [phone]   [place] E Ruffin Pyo 11/15/2020, 11:29 AM

## 2020-11-15 NOTE — Progress Notes (Addendum)
Progress Note  Patient Name: Melissa Adams Date of Encounter: 11/15/2020  Primary Cardiologist: Dr. [name], MD   Subjective   Feeling ok today. HRs remain slightly elevated. Unable to restart BB. Colonoscopy planned for tomorrow   Inpatient Medications    Scheduled Meds: . sodium chloride   Intravenous Once  . amiodarone  200 mg Oral BID  . Chlorhexidine Gluconate Cloth  6 each Topical Q0600  . cinacalcet  30 mg Oral Q supper  . midodrine  10 mg Oral Q M,W,F-HD  . montelukast  10 mg Oral Daily  . octreotide  50 mcg Intravenous Once  . pantoprazole (PROTONIX) IV  40 mg Intravenous Q12H  . peg 3350 powder  1 kit Oral Once  . sodium chloride flush  3 mL Intravenous Q12H   Continuous Infusions: . sodium chloride    . sodium chloride    . sodium chloride    . sodium chloride    . sodium chloride    . cefTRIAXone (ROCEPHIN)  IV 1 g (11/15/20 0956)  . octreotide  (SANDOSTATIN)    IV infusion Stopped (11/14/20 0052)   PRN Meds: sodium chloride, sodium chloride, sodium chloride, acetaminophen **OR** acetaminophen, albuterol, lidocaine (PF), lidocaine-prilocaine, pentafluoroprop-tetrafluoroeth, sodium chloride flush   Vital Signs    Vitals:   11/15/20 0300 11/15/20 0400 11/15/20 0409 11/15/20 0747  BP: (!) 88/54  (!) 88/54 (!) 85/45  Pulse: 99  99 100  Resp: '14 20 14 20  ' Temp: 98 F (36.7 C)   98 F (36.7 C)  TempSrc: Oral   Oral  SpO2: 100%  100% 97%  Weight:        Intake/Output Summary (Last 24 hours) at 11/15/2020 1127 Last data filed at 11/14/2020 2310 Gross per 24 hour  Intake 538 ml  Output 1544 ml  Net -1006 ml   Filed Weights   11/14/20 1840 11/14/20 2310 11/15/20 0213  Weight: 94.5 kg 93.2 kg 92.5 kg    Physical Exam   General: Well developed, well nourished, NAD Lungs:Clear to ausculation bilaterally. No wheezes, rales, or rhonchi. Breathing is unlabored. Cardiovascular: Irregularly irregular. + murmur Abdomen: Soft, non-tender,  non-distended. No obvious abdominal masses. Extremities: No edema. Radial pulses 2+ bilaterally Neuro: Alert and oriented. No focal deficits. No facial asymmetry. MAE spontaneously. Psych: Responds to questions appropriately with normal affect.    Labs    Chemistry Recent Labs  Lab 11/13/20 1318 11/14/20 0328 11/15/20 0122  NA 138 135 136  K 3.5 4.1 3.4*  CL 92* 91* 96*  CO2 '30 29 27  ' GLUCOSE 104* 103* 122*  BUN 33* 46* 15  CREATININE 3.22* 4.17* 2.54*  CALCIUM 7.7* 7.6* 7.7*  PROT 5.8* 5.5* 5.3*  ALBUMIN 3.1* 3.0* 3.2*  AST 35 35 36  ALT '28 29 26  ' ALKPHOS 115 123 100  BILITOT 1.5* 1.9* 1.4*  GFRNONAA 15* 11* 19*  ANIONGAP 16* 15 13     Hematology Recent Labs  Lab 11/14/20 0328 11/14/20 0905 11/15/20 0122  WBC 9.1 9.5 6.1  RBC 2.19* 2.43* 2.46*  HGB 7.3* 7.8* 7.9*  HCT 21.5* 24.7* 24.7*  MCV 98.2 101.6* 100.4*  MCH 33.3 32.1 32.1  MCHC 34.0 31.6 32.0  RDW 18.0* 18.7* 18.7*  PLT 146* 166 128*    Cardiac EnzymesNo results for input(s): TROPONINI in the last 168 hours. No results for input(s): TROPIPOC in the last 168 hours.   BNPNo results for input(s): BNP, PROBNP in the last 168 hours.  DDimer No results for input(s): DDIMER in the last 168 hours.   Radiology    DG CHEST PORT 1 VIEW  Result Date: 11/15/2020 CLINICAL DATA:  Pneumonia.  Follow-up. EXAM: PORTABLE CHEST 1 VIEW COMPARISON:  11/13/2020 FINDINGS: Cardiomegaly as seen previously. Right effusion with volume loss in the right lower lung, similar to the previous exam. Left lung is clear. IMPRESSION: No change. Right effusion with volume loss in the right lower lung. Cardiomegaly. Electronically Signed   By: Dariya Gainer Chimes M.D.   On: 11/15/2020 08:11   DG CHEST PORT 1 VIEW  Result Date: 11/13/2020 CLINICAL DATA:  Chest pain and shortness of breath. EXAM: PORTABLE CHEST 1 VIEW COMPARISON:  Chest x-ray 11/07/2020 FINDINGS: The heart size and mediastinal contours are unchanged with cardiomegaly. Aortic  calcifications. No focal consolidation. No pulmonary edema. Interval development of an at least small right pleural effusion. No left pleural effusion. No pneumothorax. No acute osseous abnormality. IMPRESSION: 1. Small right pleural effusion. 2. Marked cardiomegaly. Electronically Signed   By: Iven Finn M.D.   On: 11/13/2020 21:23    Telemetry    11/15/20 AF with rates in the 110's- Personally Reviewed  ECG    No new tracing as of 11/15/20- Personally Reviewed  Cardiac Studies    Echocardiogram 04/20/20:  Left ventricular ejection fraction, by estimation, is 30 to 35%. The left ventricle has moderate to severely decreased function. The left ventricle demonstrates global hypokinesis. The left ventricular internal cavity size was mildly dilated. Left ventricular diastolic parameters are indeterminate. 2. Right ventricular systolic function is mildly reduced. The right ventricular size is moderately enlarged. There is mildly elevated pulmonary artery systolic pressure. D-shaped interventricular septum consistent with RV pressure/volume overload. PA systolic pressure 43 mmHg. 3. Left atrial size was moderately dilated. 4. Right atrial size was moderately dilated. 5. The mitral valve is normal in structure. Moderate mitral valve regurgitation. No evidence of mitral stenosis. 6. Tricuspid valve regurgitation is moderate. 7. The aortic valve is tricuspid. Aortic valve regurgitation is not visualized. Mild aortic valve sclerosis is present, with no evidence of aortic valve stenosis. 8. The inferior vena cava is dilated in size with <50% respiratory variability, suggesting right atrial pressure of 15 mmHg.  LHC 08/05/19:  Normal coronary arteries  Patient Profile     74 y.o. female with a hx of chronic systolic HF with initial EF at 20-25% with improvement to 45% on most recent echo in [place], severe mitral and tricuspid regurgitation (scheduled to undergo mitral and tricuspid  replacement surgery 10/13/19 in [place], [place]>>held due to biventricular failure), paroxysmal atrial fibrillation on Eliquis, OSA on CPAP, HTN, ESRD with HD MWF, cirrohsis with chronic pleural effusions with repeat thoracentesis, and chronic hypotension on midodrine who is being seen today for the evaluation of atrial fibrillation and acute CHF at the request of Dr. Verlon Au.  Assessment & Plan    1. Acute on chronic CHF/nonischemic cardiomyopathy: -Has a known hx of non-ischemic cardiomyopathy with normal coronaries per LHC. Echo in [place] 07/2019 at 40-45% with LV reduction to 20-25% with dilated and severely dysfunctional RV with D-shaped interventricular septum in 2020. MR appeared moderate and TR appeared moderate on repeat echo 09/2019 felt to be secondary to AF with RVR versus COVID myocarditis. Volume managed with HD MWF and has struggled with fluid volume balance since being on HD.   -GDMT has been limited due to hypotension  -Consider repeat echo while inpatient  -HD for fluid management   2. Chronic atrial fibrillation: -  Has a known hx of chronic atrial fibrillation on Eliquis therapy. She was previously treated with Coumadin however this was transitioned to Eliquis approximately 1 year ago. As above, she presented with melena ans fatigue with a Hb at 5.7 treated with multiple PRBC transfusions. She was seen by GI and underwent an EGD -Unfortunately she was found to be in AF with RVR initially treated with PO Amiodarone 263m BID however this has since been discontinued -Eliquis on hold due to acute GI bleed -Rate controlling meds on hold due to hypotension  -Will let GI make final recommendations for anticoagulation restart depending on final dx per colonoscopy.  -HR's remain in the low 100's today with BP stioll to low to add BB -GI plan for colonoscopy tomorrow    3. Hypovolemic shock due to acute UGI bleed: -[facility] to have a Hb of 5.7 treated  with 2 units PRBCs (plans for and additional unit today) with some improvement in Hb to 7.2>>>7.8>>7.9 -GI has been consulted who recommended EGD with pending results  -Management per IM, GI  3. ESRD: -HD on MWF -Nephrology has been notified per primary team   4. Chronic pleural effusions: -Effusions felt to be in the setting of congestive cirrhosis as she ruled out for other etiologies of liver disease  -Maintaining adequate saturations   Signed, JKathyrn DrownNP-C HHawaiian Ocean ViewPager: [phone]/22/2021, 11:27 AM     For questions or updates, please contact   Please consult [email or web address] for contact info under Cardiology/STEMI.  The patient was seen, examined and discussed with JKathyrn Drown NP  and I agree with the above.   Chronic persistent atrial fibrillation, now with RVR in the settings of severe anemia and hypovolemia, her baseline LVEF is 30 to 35%, her blood pressure still low but improved from yesterday, she is asymptomatic this is her baseline blood pressure, her heart rates are improved and now in 90s.  Her hemoglobin is stable at 7.9 today.  Colonoscopy was rescheduled for tomorrow. Her volume is managed by hemodialysis, she is -1 L since yesterday.  She received a total of 3 units of packed red blood cells. Continue to hold anticoagulation. I would continue to hold amiodarone.  KEna Dawley MD 11/15/2020

## 2020-11-15 NOTE — Progress Notes (Signed)
[facility] Progress Note   Assessment/ Plan:   Dialysis Orders: Center: [facility] MWF 4 hr 180NRe 450/800 92 kg 2.0K/2.25 Ca UFP 2 L AVF -No heparin -Hectorol 1 mcg IV TIW -Mircera 60 mcg IV q 2 weeks (last dose 11/13/2020) -Venofer 50 mg IV weekly (Last dose 11/13/2020)   Assessment/Plan: 1.  Upper GIB-H/O tarry stools. HGB 5.7 on admission. Now S/P 3 units PRBCs. Last HGB 7.8. GI consulted. EGD 12/21 negative, getting CT abd/ pelvis today, c-scope tomorrow. 2.  AFib on Eliquis-was given Kcentra for reversal of Eliquis. Anticoagulation on hold. No heparin with HD.  3. HFrEF-last ECHO 04/21/19 EF 30-35% with global hypokinesis. Volume managed with HD. Mildly reduced RV function. Moderate TR/MR regurgitation.  4.  Recurrent pleural effusions-followed by Pulmonary.  5.  ESRD -  MWF last HD 12/20 left 1 kg above OP EDW, has received multiple units of blood and IVF. S/p extra HD yesterday, normal schedule today 6. Ulcerations on AVF/Issues with bleeding post HD-VVS consulted.  7.  Hypertension/volume  - No overt volume excess by exam. Trace BLE edema, CXR 12/20 without evidence of pulmonary edema but left above OP EDW 12/20 and had received blood products and NS here. UF today as tolerated. BP soft and variable. Uses midodrine 10 mg PO TIW with HD.  8.  Anemia  - As noted in # 1. Follow HGB, transfuse as needed.  9.  Metabolic bone disease - OP labs all at goal 11/08/2020. At goal here.  Continue VDRA, binders when able to eat. 10.  Nutrition -Albumin has fallen now 3.0. Add protein supps, renal vit when eating.    Subjective:    Hgb up to 7.9 this AM, no further bleeding episodes noted by pt.  For CT abd/ pelvis today and c-scope tomorrow.  EGD yesterday neg.  HD on today, had extra session yesterday   Objective:   BP (!) 85/45 (BP Location: Right Arm)   Pulse 100   Temp 98 F (36.7 C) (Oral)   Resp 20   Wt 92.5 kg   SpO2 97%   BMI 36.14 kg/m   Physical  Exam: Gen: NAD CVS: tachycardic Resp: muffled breath sounds at bases Abd: somewhat distended Ext:1+ LE eddma ACCESS: L AVF + T/B, thinned lower portion  Labs: BMET Recent Labs  Lab 11/13/20 1318 11/14/20 0328 11/15/20 0122  NA 138 135 136  K 3.5 4.1 3.4*  CL 92* 91* 96*  CO2 '30 29 27  ' GLUCOSE 104* 103* 122*  BUN 33* 46* 15  CREATININE 3.22* 4.17* 2.54*  CALCIUM 7.7* 7.6* 7.7*  PHOS  --  3.6  --    CBC Recent Labs  Lab 11/14/20 0015 11/14/20 0328 11/14/20 0905 11/15/20 0122  WBC 9.4 9.1 9.5 6.1  NEUTROABS  --  6.6  --  4.0  HGB 7.3* 7.3* 7.8* 7.9*  HCT 22.8* 21.5* 24.7* 24.7*  MCV 100.0 98.2 101.6* 100.4*  PLT 143* 146* 166 128*      Medications:    . sodium chloride   Intravenous Once  . amiodarone  200 mg Oral BID  . Chlorhexidine Gluconate Cloth  6 each Topical Q0600  . cinacalcet  30 mg Oral Q supper  . midodrine  10 mg Oral Q M,W,F-HD  . montelukast  10 mg Oral Daily  . octreotide  50 mcg Intravenous Once  . pantoprazole (PROTONIX) IV  40 mg Intravenous Q12H  . peg 3350 powder  1 kit Oral Once  . sodium  chloride flush  3 mL Intravenous Q12H     Madelon Lips, MD 11/15/2020, 11:07 AM

## 2020-11-15 NOTE — Progress Notes (Signed)
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 fib on Eliquis as an outpatient  Follows with Dr. Bryan Lemma in the outpatient setting-also gets recurrent Thoracentesis for recurrent effusions on the right side by Dr. Silas Flood  Return to the emergency room 12/20 dark tarry stools being lightheaded fatigue epigastric discomfort Found to have hemoglobin 5.7 patient was hypotensive initially-given 1 unit of blood blood pressure above 100. Given Kcentra Protonix octreotide Dr. Havery Moros consulted for likely upper GI bleed  Assessment & Plan:  Acute blood loss anemia/hypovolemic shock: -Unclear etiology at this time.  Either small bowel bleed or colonic bleed.  Hemoglobin 5.7 upon admission. -Status post 2 unit PRBC transfusion.  -Received Kcentra to reverse anticoagulation with Eliquis -H&H: 7.9/24.7 this morning.  Reviewed upper GI endoscopy-no blood in the lumen or cause of bleeding noted.  Incidentally noted benign appearing duodenal nodule.  Patient became hypotensive at the end of the case. -Appreciate GI help-recommend CT abdomen/pelvis today and will colonoscopy tomorrow AM. -Monitor H&H closely and transfuse as needed.  Chronic A. Fib: -Continue to hold Eliquis. -Rate control medicine on hold due to hypotension. -Monitor closely on telemetry  ESRD on hemodialysis on MWF -Appreciate nephrology help  Liver cirrhosis/chronic pleural effusions: -Thought to be in the setting of congestive cirrhosis.  Continue Rocephin for SBP prophylaxis. -On 2 L of oxygen as needed -Strict INO's and daily  weight.  Chronic systolic CHF/ischemic cardiomyopathy: -Reviewed recent echo. -Hemodialysis for fluid management -Strict INO's and daily weight.  Monitor electrolytes closely.  DVT prophylaxis: SCD Code Status: Partial Family Communication:  None present at bedside.  Plan of care discussed with patient in length and she verbalized understanding and agreed with it. Disposition Plan: To be determined  Consultants:   Cardiology  GI  Nephrology  Procedures:  EGD CT abdomen/pelvis Hemodialysis Antimicrobials:   Rocephin  Status is: Inpatient  Remains inpatient appropriate because:Ongoing diagnostic testing needed not appropriate for outpatient work up   Dispo: The patient is from: Home              Anticipated d/c is to: Home              Anticipated d/c date is: 3 days              Patient currently is not medically stable to d/c.     Subjective: Patient seen and examined.  Sitting comfortably on recliner.  Tells me that she feels terrible.  She wants to go home as soon as she is back to her baseline.  She denies any chest pain, shortness of breath, lightheadedness, dizziness, nausea, vomiting or epigastric pain.  Objective: Vitals:   11/15/20 1435 11/15/20 1440 11/15/20 1445 11/15/20 1450  BP: (!) 105/49     Pulse:      Resp: 14 15 (!) 21 (!) 21  Temp:      TempSrc:      SpO2:      Weight:        Intake/Output Summary (Last 24 hours) at 11/15/2020 1502 Last data filed at 11/15/2020 1000 Gross per 24 hour  Intake 3 ml  Output 1544 ml  Net -1541  ml   Filed Weights   11/14/20 2310 11/15/20 0213 11/15/20 1334  Weight: 93.2 kg 92.5 kg 93.1 kg    Examination:  General exam: Appears calm and comfortable, on room air, communicating well Respiratory system: Clear to auscultation. Respiratory effort normal. Cardiovascular system: S1 & S2 heard, RRR. No JVD, murmurs, rubs, gallops or clicks. No pedal edema. Gastrointestinal system: Abdomen is nondistended,  soft and nontender. No organomegaly or masses felt. Normal bowel sounds heard. Central nervous system: Alert and oriented. No focal neurological deficits. Extremities: Symmetric 5 x 5 power. Skin: No rashes, lesions or ulcers Psychiatry: Judgement and insight appear normal. Mood & affect appropriate.    Data Reviewed: I have personally reviewed following labs and imaging studies  CBC: Recent Labs  Lab 11/13/20 1318 11/14/20 0015 11/14/20 0328 11/14/20 0905 11/15/20 0122  WBC 7.9 9.4 9.1 9.5 6.1  NEUTROABS  --   --  6.6  --  4.0  HGB 5.7* 7.3* 7.3* 7.8* 7.9*  HCT 17.2* 22.8* 21.5* 24.7* 24.7*  MCV 101.2* 100.0 98.2 101.6* 100.4*  PLT 146* 143* 146* 166 575*   Basic Metabolic Panel: Recent Labs  Lab 11/13/20 1318 11/14/20 0328 11/15/20 0122  NA 138 135 136  K 3.5 4.1 3.4*  CL 92* 91* 96*  CO2 _0 GLUCOSE 104* 103* 122*  BUN 33* 46* 15  CREATININE 3.22* 4.17* 2.54*  CALCIUM 7.7* 7.6* 7.7*  MG  --  2.0  --   PHOS  --  3.6  --    GFR: Estimated Creatinine Clearance: 21.1 mL/min (A) (by C-G formula based on SCr of 2.54 mg/dL (H)). Liver Function Tests: Recent Labs  Lab 11/13/20 1318 11/14/20 0328 11/15/20 0122  AST 35 35 36  ALT _1 ALKPHOS 115 123 100  BILITOT 1.5* 1.9* 1.4*  PROT 5.8* 5.5* 5.3*  ALBUMIN 3.1* 3.0* 3.2*   No results for input(s): LIPASE, AMYLASE in the last 168 hours. No results for input(s): AMMONIA in the last 168 hours. Coagulation Profile: Recent Labs  Lab 11/14/20 0015  INR 1.4*   Cardiac Enzymes: No results for input(s): CKTOTAL, CKMB, CKMBINDEX, TROPONINI in the last 168 hours. BNP (last 3 results) No results for input(s): PROBNP in the last 8760 hours. HbA1C: No results for input(s): HGBA1C in the last 72 hours. CBG: No results for input(s): GLUCAP in the last 168 hours. Lipid Profile: No results for input(s): CHOL, HDL, LDLCALC, TRIG, CHOLHDL, LDLDIRECT in the last 72 hours. Thyroid Function Tests: Recent Labs     11/14/20 0328  TSH 3.493   Anemia Panel: No results for input(s): VITAMINB12, FOLATE, FERRITIN, TIBC, IRON, RETICCTPCT in the last 72 hours. Sepsis Labs: No results for input(s): PROCALCITON, LATICACIDVEN in the last 168 hours.  Recent Results (from the past 240 hour(s))  Resp Panel by RT-PCR (Flu A&B, Covid) Nasopharyngeal Swab     Status: None   Collection Time: 11/13/20  8:26 PM   Specimen: Nasopharyngeal Swab; Nasopharyngeal(NP) swabs in vial transport medium  Result Value Ref Range Status   SARS Coronavirus 2 by RT PCR NEGATIVE NEGATIVE Final    Comment: (NOTE) SARS-CoV-2 target nucleic acids are NOT DETECTED.  The SARS-CoV-2 RNA is generally detectable in upper respiratory specimens during the acute phase of infection. The lowest concentration of SARS-CoV-2 viral copies this assay can detect is 138 copies/mL. A negative result does not preclude SARS-Cov-2 infection and should not be used as the sole basis for treatment or other patient  management decisions. A negative result may occur with  improper specimen collection/handling, submission of specimen other than nasopharyngeal swab, presence of viral mutation(s) within the areas targeted by this assay, and inadequate number of viral copies(<138 copies/mL). A negative result must be combined with clinical observations, patient history, and epidemiological information. The expected result is Negative.  Fact Sheet for Patients:  EntrepreneurPulse.com.au  Fact Sheet for Healthcare Providers:  IncredibleEmployment.be  This test is no t yet approved or cleared by the [place] FDA and  has been authorized for detection and/or diagnosis of SARS-CoV-2 by FDA under an Emergency Use Authorization (EUA). This EUA will remain  in effect (meaning this test can be used) for the duration of the COVID-19 declaration under Section 564(b)(1) of the Act, 21 U.S.C.section 360bbb-3(b)(1), unless  the authorization is terminated  or revoked sooner.       Influenza A by PCR NEGATIVE NEGATIVE Final   Influenza B by PCR NEGATIVE NEGATIVE Final    Comment: (NOTE) The Xpert Xpress SARS-CoV-2/FLU/RSV plus assay is intended as an aid in the diagnosis of influenza from Nasopharyngeal swab specimens and should not be used as a sole basis for treatment. Nasal washings and aspirates are unacceptable for Xpert Xpress SARS-CoV-2/FLU/RSV testing.  Fact Sheet for Patients: EntrepreneurPulse.com.au  Fact Sheet for Healthcare Providers: IncredibleEmployment.be  This test is not yet approved or cleared by the [place] FDA and has been authorized for detection and/or diagnosis of SARS-CoV-2 by FDA under an Emergency Use Authorization (EUA). This EUA will remain in effect (meaning this test can be used) for the duration of the COVID-19 declaration under Section 564(b)(1) of the Act, 21 U.S.C. section 360bbb-3(b)(1), unless the authorization is terminated or revoked.  Performed at [facility], [place] [address], [place], [place] [address]       Radiology Studies: DG CHEST PORT 1 VIEW  Result Date: 11/15/2020 CLINICAL DATA:  Pneumonia.  Follow-up. EXAM: PORTABLE CHEST 1 VIEW COMPARISON:  11/13/2020 FINDINGS: Cardiomegaly as seen previously. Right effusion with volume loss in the right lower lung, similar to the previous exam. Left lung is clear. IMPRESSION: No change. Right effusion with volume loss in the right lower lung. Cardiomegaly. Electronically Signed   By: Nelson Chimes M.D.   On: 11/15/2020 08:11   DG CHEST PORT 1 VIEW  Result Date: 11/13/2020 CLINICAL DATA:  Chest pain and shortness of breath. EXAM: PORTABLE CHEST 1 VIEW COMPARISON:  Chest x-ray 11/07/2020 FINDINGS: The heart size and mediastinal contours are unchanged with cardiomegaly. Aortic calcifications. No focal consolidation. No pulmonary edema. Interval development of an at  least small right pleural effusion. No left pleural effusion. No pneumothorax. No acute osseous abnormality. IMPRESSION: 1. Small right pleural effusion. 2. Marked cardiomegaly. Electronically Signed   By: Iven Finn M.D.   On: 11/13/2020 21:23    Scheduled Meds: . sodium chloride   Intravenous Once  . amiodarone  200 mg Oral BID  . Chlorhexidine Gluconate Cloth  6 each Topical Q0600  . cinacalcet  30 mg Oral Q supper  . midodrine  10 mg Oral Q M,W,F-HD  . montelukast  10 mg Oral Daily  . octreotide  50 mcg Intravenous Once  . pantoprazole (PROTONIX) IV  40 mg Intravenous Q12H  . peg 3350 powder  1 kit Oral Once  . sodium chloride flush  3 mL Intravenous Q12H   Continuous Infusions: . sodium chloride    . sodium chloride    . sodium chloride    .  sodium chloride    . sodium chloride    . cefTRIAXone (ROCEPHIN)  IV 1 g (11/15/20 0956)  . octreotide  (SANDOSTATIN)    IV infusion Stopped (11/14/20 0052)     LOS: 2 days   Time spent: 35 minutes   Ameya Vowell Loann Quill, MD [facility]  If 7PM-7AM, please contact night-coverage [email or web address] 11/15/2020, 3:02 PM

## 2020-11-15 NOTE — Anesthesia Postprocedure Evaluation (Signed)
Anesthesia Post Note  Patient: Melissa Adams  Procedure(s) Performed: ESOPHAGOGASTRODUODENOSCOPY (EGD) WITH PROPOFOL (Left ) BIOPSY     Patient location during evaluation: PACU Anesthesia Type: MAC Level of consciousness: awake and alert Pain management: pain level controlled Vital Signs Assessment: post-procedure vital signs reviewed and stable Respiratory status: spontaneous breathing, nonlabored ventilation, respiratory function stable and patient connected to nasal cannula oxygen Cardiovascular status: stable and blood pressure returned to baseline Postop Assessment: no apparent nausea or vomiting Anesthetic complications: no   No complications documented.  Last Vitals:  Vitals:   11/15/20 1751 11/15/20 1851  BP: 101/74 (!) 96/53  Pulse:  96  Resp: 15 16  Temp:  36.6 C  SpO2:  96%    Last Pain:  Vitals:   11/15/20 1851  TempSrc: Oral  PainSc:    Pain Goal:                   [name]

## 2020-11-15 NOTE — Evaluation (Signed)
Occupational Therapy Evaluation Patient Details Name: Melissa Adams MRN: [id number] DOB: [birth year] Today's Date: 11/15/2020    History of Present Illness 74 y.o. female past medical history of A. fib (on Eliquis), CHF, CKD on HD MWF, hyperlipidemia, hypertension who presents for evaluation of melena x3 days as well as fatigue. Upper GI endoscopy reveals subepithelial nodule in second portion of duodenum, biopsied. Pt also with afib with RVR, cardiology following.   Clinical Impression   Patient admitted with the above diagnosis.  She is feeling sleepy today, stating she had dialysis late last night.  Overall she is functioning well, needing only setup and supervision for ADL and basic mobility.  At home she is Mod I with mobility at cane level, and needs only assist for community mobility.  Patient looks as if she could transition home with intermittent assist from family.  OT will follow in the acute setting to ensure progression to her prior level.      Follow Up Recommendations  No OT follow up    Equipment Recommendations  None recommended by OT    Recommendations for Other Services       Precautions / Restrictions Precautions Precautions: Fall Precaution Comments: HR - in afib Restrictions Weight Bearing Restrictions: No      Mobility Bed Mobility Overal bed mobility: Modified Independent             General bed mobility comments: up in chair upon PT arrival, requests back to recliner upon PT exit.    Transfers Overall transfer level: Needs assistance Equipment used: None Transfers: Sit to/from [facility] Sit to Stand: Supervision Stand pivot transfers: Supervision       General transfer comment: supervision for safety, slow to rise and steady.    Balance Overall balance assessment: Needs assistance Sitting-balance support: No upper extremity supported Sitting balance-Leahy Scale: Good     Standing balance support: Single  extremity supported Standing balance-Leahy Scale: Fair                             ADL either performed or assessed with clinical judgement   ADL Overall ADL's : Needs assistance/impaired Eating/Feeding: Independent   Grooming: Wash/dry hands;Wash/dry face;Oral care;Supervision/safety;Standing   Upper Body Bathing: Sitting;Set up   Lower Body Bathing: Supervison/ safety;Sit to/from stand   Upper Body Dressing : Set up;Sitting   Lower Body Dressing: Supervision/safety;Sit to/from stand   Toilet Transfer: Supervision/safety   Toileting- Water quality scientist and Hygiene: Independent       Functional mobility during ADLs: Supervision/safety General ADL Comments: No AD in room, patient reaching for objects in her environment.     Vision Baseline Vision/History: No visual deficits Patient Visual Report: No change from baseline       Perception     Praxis      Pertinent Vitals/Pain Pain Assessment: No/denies pain     Hand Dominance Right   Extremity/Trunk Assessment Upper Extremity Assessment Upper Extremity Assessment: Overall WFL for tasks assessed LUE Deficits / Details: dropping things with L hand this day, per pt report this is new. Strength WFL and symmetric to R when assessed LUE Coordination: decreased fine motor   Lower Extremity Assessment Lower Extremity Assessment: Defer to PT evaluation   Cervical / Trunk Assessment Cervical / Trunk Assessment: Normal   Communication Communication Communication: No difficulties   Cognition Arousal/Alertness: Lethargic Behavior During Therapy: WFL for tasks assessed/performed Overall Cognitive Status: Within Functional Limits for tasks assessed  Home Living Family/patient expects to be discharged to:: Private residence Living Arrangements: Children Available Help at Discharge: Family;Available 24 hours/day Type of Home:  Apartment Home Access: Level entry     Home Layout: One level     Bathroom Shower/Tub: Teacher, early years/pre: Standard     Home Equipment: Marine scientist - single point          Prior Functioning/Environment Level of Independence: Independent with assistive device(s)        Comments: uses cane for extra support going to and from HD, son-in-law and daughter drive pt to dialysis.  Otherwise she is independent with ADL, meals and home management.        OT Problem List: Impaired balance (sitting and/or standing)      OT Treatment/Interventions: Self-care/ADL training;Therapeutic exercise;Balance training;Therapeutic activities    OT Goals(Current goals can be found in the care plan section) Acute Rehab OT Goals Patient Stated Goal: I just want to feel better OT Goal Formulation: With patient Time For Goal Achievement: 11/29/20 Potential to Achieve Goals: Good ADL Goals Pt Will Perform Grooming: Independently Pt Will Perform Lower Body Bathing: with modified independence Pt Will Perform Lower Body Dressing: with modified independence Pt Will Transfer to Toilet: with modified independence  OT Frequency: Min 2X/week   Barriers to D/C:    none noted       Co-evaluation              AM-PAC OT "6 Clicks" Daily Activity     Outcome Measure Help from another person eating meals?: None Help from another person taking care of personal grooming?: A Little Help from another person toileting, which includes using toliet, bedpan, or urinal?: A Little Help from another person bathing (including washing, rinsing, drying)?: A Little Help from another person to put on and taking off regular upper body clothing?: None Help from another person to put on and taking off regular lower body clothing?: A Little 6 Click Score: 20   End of Session Nurse Communication: Other (comment) (IV beeping)  Activity Tolerance: Patient tolerated treatment well Patient left:  in bed;with call bell/phone within reach  OT Visit Diagnosis: Unsteadiness on feet (R26.81)                Time: 3614-4315 OT Time Calculation (min): 16 min Charges:  OT General Charges $OT Visit: 1 Visit OT Evaluation $OT Eval Moderate Complexity: 1 Mod  11/15/2020  Rich, OTR/L  Acute Rehabilitation Services  Office:  [phone]   Metta Clines 11/15/2020, 11:20 AM

## 2020-11-16 ENCOUNTER — Other Ambulatory Visit: Payer: Self-pay

## 2020-11-16 ENCOUNTER — Inpatient Hospital Stay (HOSPITAL_COMMUNITY): Payer: Medicare HMO | Admitting: Anesthesiology

## 2020-11-16 ENCOUNTER — Encounter (HOSPITAL_COMMUNITY)
Admission: EM | Disposition: A | Discharge status: Home / Self Care | Payer: Self-pay | Source: Home / Self Care | Attending: Internal Medicine

## 2020-11-16 ENCOUNTER — Encounter (HOSPITAL_COMMUNITY): Payer: Self-pay | Admitting: Internal Medicine

## 2020-11-16 DIAGNOSIS — D126 Benign neoplasm of colon, unspecified: Secondary | ICD-10-CM

## 2020-11-16 HISTORY — PX: BIOPSY: SHX5522

## 2020-11-16 HISTORY — PX: POLYPECTOMY: SHX5525

## 2020-11-16 HISTORY — PX: COLONOSCOPY WITH PROPOFOL: SHX5780

## 2020-11-16 HISTORY — PX: GIVENS CAPSULE STUDY: SHX5432

## 2020-11-16 LAB — COMPREHENSIVE METABOLIC PANEL
ALT: 40 U/L (ref 0–44)
AST: 56 U/L — ABNORMAL HIGH (ref 15–41)
Albumin: 3.7 g/dL (ref 3.5–5.0)
Alkaline Phosphatase: 116 U/L (ref 38–126)
Anion gap: 18 — ABNORMAL HIGH (ref 5–15)
BUN: 7 mg/dL — ABNORMAL LOW (ref 8–23)
CO2: 22 mmol/L (ref 22–32)
Calcium: 8 mg/dL — ABNORMAL LOW (ref 8.9–10.3)
Chloride: 100 mmol/L (ref 98–111)
Creatinine, Ser: 2.24 mg/dL — ABNORMAL HIGH (ref 0.44–1.00)
GFR, Estimated: 22 mL/min — ABNORMAL LOW (ref >60)
Glucose, Bld: 81 mg/dL (ref 70–99)
Potassium: 3.7 mmol/L (ref 3.5–5.1)
Sodium: 140 mmol/L (ref 135–145)
Total Bilirubin: 1.5 mg/dL — ABNORMAL HIGH (ref 0.3–1.2)
Total Protein: 6.3 g/dL — ABNORMAL LOW (ref 6.5–8.1)

## 2020-11-16 LAB — CBC WITH DIFFERENTIAL/PLATELET
Abs Immature Granulocytes: 0.03 10*3/uL (ref 0.00–0.07)
Basophils Absolute: 0 10*3/uL (ref 0.0–0.1)
Basophils Relative: 0 %
Eosinophils Absolute: 0 10*3/uL (ref 0.0–0.5)
Eosinophils Relative: 0 %
HCT: 29 % — ABNORMAL LOW (ref 36.0–46.0)
Hemoglobin: 9.5 g/dL — ABNORMAL LOW (ref 12.0–15.0)
Immature Granulocytes: 0 %
Lymphocytes Relative: 11 %
Lymphs Abs: 0.9 10*3/uL (ref 0.7–4.0)
MCH: 33.8 pg (ref 26.0–34.0)
MCHC: 32.8 g/dL (ref 30.0–36.0)
MCV: 103.2 fL — ABNORMAL HIGH (ref 80.0–100.0)
Monocytes Absolute: 1.3 10*3/uL — ABNORMAL HIGH (ref 0.1–1.0)
Monocytes Relative: 17 %
Neutro Abs: 5.6 10*3/uL (ref 1.7–7.7)
Neutrophils Relative %: 72 %
Platelets: 144 10*3/uL — ABNORMAL LOW (ref 150–400)
RBC: 2.81 MIL/uL — ABNORMAL LOW (ref 3.87–5.11)
RDW: 20.9 % — ABNORMAL HIGH (ref 11.5–15.5)
WBC: 7.9 10*3/uL (ref 4.0–10.5)
nRBC: 1.5 % — ABNORMAL HIGH (ref 0.0–0.2)

## 2020-11-16 LAB — MAGNESIUM: Magnesium: 2 mg/dL (ref 1.7–2.4)

## 2020-11-16 LAB — SURGICAL PATHOLOGY

## 2020-11-16 SURGERY — COLONOSCOPY WITH PROPOFOL
Anesthesia: Monitor Anesthesia Care

## 2020-11-16 MED ORDER — PHENYLEPHRINE HCL-NACL 10-0.9 MG/250ML-% IV SOLN
INTRAVENOUS | Status: DC | PRN
  Administered 2020-11-16: 50 ug/min via INTRAVENOUS

## 2020-11-16 MED ORDER — SODIUM CHLORIDE 0.9 % IV SOLN: INTRAVENOUS | Status: DC | PRN

## 2020-11-16 MED ORDER — LIDOCAINE HCL (CARDIAC) PF 100 MG/5ML IV SOSY
PREFILLED_SYRINGE | INTRAVENOUS | Status: DC | PRN
  Administered 2020-11-16: 60 mg via INTRAVENOUS

## 2020-11-16 MED ORDER — ALBUMIN HUMAN 5 % IV SOLN
12.5000 g | Freq: Once | INTRAVENOUS | Status: AC
  Administered 2020-11-16: 12.5 g via INTRAVENOUS

## 2020-11-16 MED ORDER — METOPROLOL TARTRATE 25 MG PO TABS
25.0000 mg | ORAL_TABLET | Freq: Two times a day (BID) | ORAL | Status: DC
  Administered 2020-11-16 (×2): 25 mg via ORAL
  Filled 2020-11-16 (×2): qty 1

## 2020-11-16 MED ORDER — PROPOFOL 500 MG/50ML IV EMUL
INTRAVENOUS | Status: DC | PRN
  Administered 2020-11-16: 100 ug/kg/min via INTRAVENOUS

## 2020-11-16 MED ORDER — METOPROLOL TARTRATE 5 MG/5ML IV SOLN: 2.5000 mg | Freq: Once | INTRAVENOUS | Status: DC

## 2020-11-16 MED ORDER — ALBUMIN HUMAN 5 % IV SOLN: 12.5000 g | Freq: Once | INTRAVENOUS | Status: AC

## 2020-11-16 MED ORDER — ALBUMIN HUMAN 5 % IV SOLN
INTRAVENOUS | Status: AC
  Filled 2020-11-16: qty 500

## 2020-11-16 MED ORDER — ALBUMIN HUMAN 5 % IV SOLN: INTRAVENOUS | Status: DC | PRN

## 2020-11-16 MED ORDER — ALBUMIN HUMAN 5 % IV SOLN
INTRAVENOUS | Status: AC
  Administered 2020-11-16: 12.5 g via INTRAVENOUS
  Filled 2020-11-16: qty 250

## 2020-11-16 SURGICAL SUPPLY — 22 items

## 2020-11-16 NOTE — Progress Notes (Signed)
  [facility] Progress Note   Assessment/ Plan:   Dialysis Orders: Center: [facility] MWF 4 hr 180NRe 450/800 92 kg 2.0K/2.25 Ca UFP 2 L AVF -No heparin -Hectorol 1 mcg IV TIW -Mircera 60 mcg IV q 2 weeks (last dose 11/13/2020) -Venofer 50 mg IV weekly (Last dose 11/13/2020)   Assessment/Plan: 1.  Upper GIB-H/O tarry stools. HGB 5.7 on admission. Now S/P 3 units PRBCs. Last HGB 7.8--> 9.5 12/23/AM. GI consulted. EGD 12/21 negative, c-scope negative, capsule study in progress 2.  AFib on Eliquis-was given Kcentra for reversal of Eliquis. Anticoagulation on hold. No heparin with HD.  3. HFrEF-last ECHO 04/21/19 EF 30-35% with global hypokinesis. Volume managed with HD. Mildly reduced RV function. Moderate TR/MR regurgitation.  4.  Recurrent pleural effusions-followed by Pulmonary.  5.  ESRD -  MWF last HD 12/20 left 1 kg above OP EDW, has received multiple units of blood and IVF. S/p extra HD 12/21, normal schedule 12/22.  Next rx 12/24. 6. Ulcerations on AVF/Issues with bleeding post HD-VVS consulted.  7.  Hypertension/volume  - No overt volume excess by exam. Trace BLE edema, CXR 12/20 without evidence of pulmonary edema but left above OP EDW 12/20 and had received blood products and NS here.  BP soft and variable. Uses midodrine 10 mg PO TIW with HD.  8.  Anemia  - As noted in # 1. Follow HGB, transfuse as needed.  9.  Metabolic bone disease - OP labs all at goal 11/08/2020. At goal here.  Continue VDRA, binders when able to eat. 10.  Nutrition -Albumin has fallen now 3.0. Add protein supps, renal vit when eating.    Subjective:    Had c-scope this AM- negative for active bleeding.  Capsule study in progress.    Objective:   BP (!) 79/65   Pulse 80   Temp 97.6 F (36.4 C) (Oral)   Resp 15   Wt 90.1 kg   SpO2 96%   BMI 35.19 kg/m   Physical Exam: Gen: NAD CVS: tachycardic Resp: muffled breath sounds at bases Abd: somewhat distended Ext:1+ LE eddma, largely  unchanged ACCESS: L AVF + T/B, thinned lower portion  Labs: BMET Recent Labs  Lab 11/13/20 1318 11/14/20 0328 11/15/20 0122 11/16/20 0127  NA 138 135 136 140  K 3.5 4.1 3.4* 3.7  CL 92* 91* 96* 100  CO2 30 29 27 22   GLUCOSE 104* 103* 122* 81  BUN 33* 46* 15 7*  CREATININE 3.22* 4.17* 2.54* 2.24*  CALCIUM 7.7* 7.6* 7.7* 8.0*  PHOS  --  3.6  --   --    CBC Recent Labs  Lab 11/14/20 0328 11/14/20 0905 11/15/20 0122 11/16/20 0127  WBC 9.1 9.5 6.1 7.9  NEUTROABS 6.6  --  4.0 5.6  HGB 7.3* 7.8* 7.9* 9.5*  HCT 21.5* 24.7* 24.7* 29.0*  MCV 98.2 101.6* 100.4* 103.2*  PLT 146* 166 128* 144*      Medications:    . sodium chloride   Intravenous Once  . amiodarone  200 mg Oral BID  . Chlorhexidine Gluconate Cloth  6 each Topical Q0600  . cinacalcet  30 mg Oral Q supper  . metoprolol tartrate  25 mg Oral BID  . midodrine  10 mg Oral Q M,W,F-HD  . montelukast  10 mg Oral Daily  . pantoprazole  40 mg Oral Q0600  . sodium chloride flush  3 mL Intravenous Q12H     Madelon Lips, MD 11/16/2020, 2:04 PM

## 2020-11-16 NOTE — Progress Notes (Addendum)
Pt come back from colonoscopy. Pt alert. Reinitiated tele. bp low. Paged MD. Pt asymptomatic.  Administered NS 179ml per order.   Lavenia Atlas, RN

## 2020-11-16 NOTE — Anesthesia Preprocedure Evaluation (Addendum)
Anesthesia Evaluation  Patient identified by MRN, date of birth, ID band Patient awake    Reviewed: Allergy & Precautions, H&P , NPO status , Patient's Chart, lab work & pertinent test results, Unable to perform ROS - Chart review only  History of Anesthesia Complications Negative for: history of anesthetic complications  Airway Mallampati: II   Neck ROM: Full  Mouth opening: Limited Mouth Opening  Dental  (+) Partial Lower, Partial Upper, Dental Advisory Given   Pulmonary asthma , sleep apnea , former smoker,    Pulmonary exam normal        Cardiovascular hypertension, +CHF  Normal cardiovascular exam+ Valvular Problems/Murmurs MR   Echo 03/2020 1. Left ventricular ejection fraction, by estimation, is 30 to 35%. The left ventricle has moderate to severely decreased function. The left ventricle demonstrates global hypokinesis. The left ventricular internal cavity size was mildly dilated. Left ventricular diastolic parameters are indeterminate.  2. Right ventricular systolic function is mildly reduced. The right ventricular size is moderately enlarged. There is mildly elevated pulmonary artery systolic pressure. D-shaped interventricular septum consistent with RV pressure/volume overload. PA systolic pressure 43 mmHg.  3. Left atrial size was moderately dilated.  4. Right atrial size was moderately dilated.  5. The mitral valve is normal in structure. Moderate mitral valve regurgitation. No evidence of mitral stenosis.  6. Tricuspid valve regurgitation is moderate.  7. The aortic valve is tricuspid. Aortic valve regurgitation is not visualized. Mild aortic valve sclerosis is present, with no evidence of aortic valve stenosis.  8. The inferior vena cava is dilated in size with <50% respiratory variability, suggesting right atrial pressure of 15 mmHg.    Neuro/Psych negative neurological ROS  negative psych ROS    GI/Hepatic negative GI ROS, Neg liver ROS,   Endo/Other  negative endocrine ROS  Renal/GU Renal disease  negative genitourinary   Musculoskeletal  (+) Arthritis , Osteoarthritis,    Abdominal   Peds  Hematology  (+) Blood dyscrasia, anemia ,   Anesthesia Other Findings   Reproductive/Obstetrics negative OB ROS                           Anesthesia Physical  Anesthesia Plan  ASA: IV  Anesthesia Plan: MAC   Post-op Pain Management:    Induction: Intravenous  PONV Risk Score and Plan: 2 and Propofol infusion and Treatment may vary due to age or medical condition  Airway Management Planned: Nasal Cannula  Additional Equipment:   Intra-op Plan:   Post-operative Plan:   Informed Consent: I have reviewed the patients History and Physical, chart, labs and discussed the procedure including the risks, benefits and alternatives for the proposed anesthesia with the patient or authorized representative who has indicated his/her understanding and acceptance.   Patient has DNR.   Dental advisory given  Plan Discussed with: CRNA and Anesthesiologist  Anesthesia Plan Comments:        Anesthesia Quick Evaluation

## 2020-11-16 NOTE — Interval H&P Note (Signed)
History and Physical Interval Note: PAtient was not able to get CT yesterday, was in HD. SHe prepped for colonoscopy. No active bleeding noted, old dark stool. She had AR with RVR last night, given beta blocker per primary team which lowered HR but made her relatively hypotensive with BP in the 49T systolic. She has not had albumin infusion in the Endo suite and systolics in 55-21V, per anesthesia okay to proceed with colonoscopy at this time. HOpefully we can clarify etiology of her bleeding with colonoscopy. If not, may need capsule endoscopy or enterography study. Discussed risks / benefits of anesthesia / colonoscopy with her, she wishes to proceed, further recommendations pending results. She agreed.   11/16/2020 10:27 AM  Melissa Adams  has presented today for surgery, with the diagnosis of melena / GI bleed, negative EGD.  The various methods of treatment have been discussed with the patient and family. After consideration of risks, benefits and other options for treatment, the patient has consented to  Procedure(s): COLONOSCOPY WITH PROPOFOL (N/A) as a surgical intervention.  The patient's history has been reviewed, patient examined, no change in status, stable for surgery.  I have reviewed the patient's chart and labs.  Questions were answered to the patient's satisfaction.     [place]

## 2020-11-16 NOTE — Progress Notes (Signed)
OK PER DR SINGER TO RETURN TO MEDICAL FLOOR.

## 2020-11-16 NOTE — Progress Notes (Signed)
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 fib on Eliquis as an outpatient  Follows with Dr. Bryan Lemma in the outpatient setting-also gets recurrent Thoracentesis for recurrent effusions on the right side by Dr. Silas Flood  Return to the emergency room 12/20 dark tarry stools being lightheaded fatigue epigastric discomfort Found to have hemoglobin 5.7 patient was hypotensive initially-given 1 unit of blood blood pressure above 100. Given Kcentra Protonix octreotide Dr. Havery Moros consulted for likely upper GI bleed  Assessment & Plan:  Acute blood loss anemia/hypovolemic shock: -Unclear etiology at this time.  Either small bowel bleed or colonic bleed.  Hemoglobin 5.7 upon admission. -Status post 2 unit PRBC transfusion.  -Received Kcentra to reverse anticoagulation with Eliquis -Hemoglobin improved from 7.9-9.5 this morning.  Reviewed upper GI endoscopy-no blood in the lumen or cause of bleeding noted.  Incidentally noted benign appearing duodenal nodule.  Patient became hypotensive at the end of the case. -Patient underwent colonoscopy on 12/23-showed polyps in cecum, ascending colon, transverse colon-resected.  Congested mucosa right ileocecal valve-biopsied-GI recommend capsule endoscopy.  Appreciate GI help. -Monitor H&H closely and transfuse as needed.  Chronic A. Fib: -Continue to hold Eliquis. -Rate control medicine on hold due to hypotension. -Monitor closely on telemetry  ESRD on hemodialysis on MWF -Appreciate nephrology help  Liver cirrhosis/chronic pleural effusions: -Thought to be in the  setting of congestive cirrhosis.  Continue Rocephin for SBP prophylaxis. -On 2 L of oxygen as needed -Strict INO's and daily weight.  Chronic systolic CHF/ischemic cardiomyopathy: -Reviewed recent echo. -Hemodialysis for fluid management -Strict INO's and daily weight.  Monitor electrolytes closely.  Hypokalemia: Replenished.  Repeat potassium level: WNL.  Magnesium: WNL.  Morbid obesity with BMI of 35: -Diet modification/exercise and weight loss recommended.  DVT prophylaxis: SCD Code Status: Partial Family Communication:  None present at bedside.  Plan of care discussed with patient in length and she verbalized understanding and agreed with it. Disposition Plan: To be determined  Consultants:   Cardiology  GI  Nephrology  Procedures:  EGD CT abdomen/pelvis Hemodialysis Antimicrobials:   Rocephin  Status is: Inpatient  Remains inpatient appropriate because:Ongoing diagnostic testing needed not appropriate for outpatient work up   Dispo: The patient is from: Home              Anticipated d/c is to: Home              Anticipated d/c date is: 3 days              Patient currently is not medically stable to d/c.     Subjective: Patient seen and examined.  Sitting on the bed, tearful, tells me that she wanted to get done with her work-up and go home as soon as possible to spend Christmas with her family.  She denies any symptoms such as chest pain, shortness of breath, lightheadedness or dizziness.  Objective: Vitals:   11/16/20 1145 11/16/20 1155 11/16/20 1215 11/16/20 1254  BP: (!) 113/96  (!) 76/45 (!) 79/65  Pulse: 76 86 80   Resp: 13   15  Temp:      TempSrc:      SpO2:  97% 97% 96%   Weight:        Intake/Output Summary (Last 24 hours) at 11/16/2020 1327 Last data filed at 11/15/2020 1750 Gross per 24 hour  Intake --  Output 3000 ml  Net -3000 ml   Filed Weights   11/15/20 0213 11/15/20 1334 11/15/20 1750  Weight: 92.5 kg 93.1 kg 90.1 kg     Examination:  General exam: Appears calm and comfortable, on room air, communicating well Respiratory system: Clear to auscultation. Respiratory effort normal. Cardiovascular system: S1 & S2 heard, RRR. No JVD, murmurs, rubs, gallops or clicks. No pedal edema. Gastrointestinal system: Abdomen is nondistended, soft and nontender. No organomegaly or masses felt. Normal bowel sounds heard. Central nervous system: Alert and oriented. No focal neurological deficits. Extremities: Symmetric 5 x 5 power. Skin: No rashes, lesions or ulcers Psychiatry: Judgement and insight appear normal. Mood & affect appropriate.    Data Reviewed: I have personally reviewed following labs and imaging studies  CBC: Recent Labs  Lab 11/14/20 0015 11/14/20 0328 11/14/20 0905 11/15/20 0122 11/16/20 0127  WBC 9.4 9.1 9.5 6.1 7.9  NEUTROABS  --  6.6  --  4.0 5.6  HGB 7.3* 7.3* 7.8* 7.9* 9.5*  HCT 22.8* 21.5* 24.7* 24.7* 29.0*  MCV 100.0 98.2 101.6* 100.4* 103.2*  PLT 143* 146* 166 128* 742*   Basic Metabolic Panel: Recent Labs  Lab 11/13/20 1318 11/14/20 0328 11/15/20 0122 11/16/20 0127  NA 138 135 136 140  K 3.5 4.1 3.4* 3.7  CL 92* 91* 96* 100  CO2 30 29 27 22   GLUCOSE 104* 103* 122* 81  BUN 33* 46* 15 7*  CREATININE 3.22* 4.17* 2.54* 2.24*  CALCIUM 7.7* 7.6* 7.7* 8.0*  MG  --  2.0  --  2.0  PHOS  --  3.6  --   --    GFR: Estimated Creatinine Clearance: 23.5 mL/min (A) (by C-G formula based on SCr of 2.24 mg/dL (H)). Liver Function Tests: Recent Labs  Lab 11/13/20 1318 11/14/20 0328 11/15/20 0122 11/16/20 0127  AST 35 35 36 56*  ALT 28 29 26  40  ALKPHOS 115 123 100 116  BILITOT 1.5* 1.9* 1.4* 1.5*  PROT 5.8* 5.5* 5.3* 6.3*  ALBUMIN 3.1* 3.0* 3.2* 3.7   No results for input(s): LIPASE, AMYLASE in the last 168 hours. No results for input(s): AMMONIA in the last 168 hours. Coagulation Profile: Recent Labs  Lab 11/14/20 0015  INR 1.4*   Cardiac Enzymes: No results for  input(s): CKTOTAL, CKMB, CKMBINDEX, TROPONINI in the last 168 hours. BNP (last 3 results) No results for input(s): PROBNP in the last 8760 hours. HbA1C: No results for input(s): HGBA1C in the last 72 hours. CBG: No results for input(s): GLUCAP in the last 168 hours. Lipid Profile: No results for input(s): CHOL, HDL, LDLCALC, TRIG, CHOLHDL, LDLDIRECT in the last 72 hours. Thyroid Function Tests: Recent Labs    11/14/20 0328  TSH 3.493   Anemia Panel: No results for input(s): VITAMINB12, FOLATE, FERRITIN, TIBC, IRON, RETICCTPCT in the last 72 hours. Sepsis Labs: No results for input(s): PROCALCITON, LATICACIDVEN in the last 168 hours.  Recent Results (from the past 240 hour(s))  Resp Panel by RT-PCR (Flu A&B, Covid) Nasopharyngeal Swab     Status: None   Collection Time: 11/13/20  8:26 PM   Specimen: Nasopharyngeal Swab; Nasopharyngeal(NP) swabs in vial transport medium  Result Value Ref Range Status   SARS Coronavirus 2 by RT PCR NEGATIVE NEGATIVE Final    Comment: (  NOTE) SARS-CoV-2 target nucleic acids are NOT DETECTED.  The SARS-CoV-2 RNA is generally detectable in upper respiratory specimens during the acute phase of infection. The lowest concentration of SARS-CoV-2 viral copies this assay can detect is 138 copies/mL. A negative result does not preclude SARS-Cov-2 infection and should not be used as the sole basis for treatment or other patient management decisions. A negative result may occur with  improper specimen collection/handling, submission of specimen other than nasopharyngeal swab, presence of viral mutation(s) within the areas targeted by this assay, and inadequate number of viral copies(<138 copies/mL). A negative result must be combined with clinical observations, patient history, and epidemiological information. The expected result is Negative.  Fact Sheet for Patients:  EntrepreneurPulse.com.au  Fact Sheet for Healthcare Providers:   IncredibleEmployment.be  This test is no t yet approved or cleared by the [place] FDA and  has been authorized for detection and/or diagnosis of SARS-CoV-2 by FDA under an Emergency Use Authorization (EUA). This EUA will remain  in effect (meaning this test can be used) for the duration of the COVID-19 declaration under Section 564(b)(1) of the Act, 21 U.S.C.section 360bbb-3(b)(1), unless the authorization is terminated  or revoked sooner.       Influenza A by PCR NEGATIVE NEGATIVE Final   Influenza B by PCR NEGATIVE NEGATIVE Final    Comment: (NOTE) The Xpert Xpress SARS-CoV-2/FLU/RSV plus assay is intended as an aid in the diagnosis of influenza from Nasopharyngeal swab specimens and should not be used as a sole basis for treatment. Nasal washings and aspirates are unacceptable for Xpert Xpress SARS-CoV-2/FLU/RSV testing.  Fact Sheet for Patients: EntrepreneurPulse.com.au  Fact Sheet for Healthcare Providers: IncredibleEmployment.be  This test is not yet approved or cleared by the [place] FDA and has been authorized for detection and/or diagnosis of SARS-CoV-2 by FDA under an Emergency Use Authorization (EUA). This EUA will remain in effect (meaning this test can be used) for the duration of the COVID-19 declaration under Section 564(b)(1) of the Act, 21 U.S.C. section 360bbb-3(b)(1), unless the authorization is terminated or revoked.  Performed at [facility], [address] [place], [place] [address]       Radiology Studies: DG CHEST PORT 1 VIEW  Result Date: 11/15/2020 CLINICAL DATA:  Pneumonia.  Follow-up. EXAM: PORTABLE CHEST 1 VIEW COMPARISON:  11/13/2020 FINDINGS: Cardiomegaly as seen previously. Right effusion with volume loss in the right lower lung, similar to the previous exam. Left lung is clear. IMPRESSION: No change. Right effusion with volume loss in the right lower lung.  Cardiomegaly. Electronically Signed   By: Nelson Chimes M.D.   On: 11/15/2020 08:11    Scheduled Meds: . sodium chloride   Intravenous Once  . amiodarone  200 mg Oral BID  . Chlorhexidine Gluconate Cloth  6 each Topical Q0600  . cinacalcet  30 mg Oral Q supper  . metoprolol tartrate  25 mg Oral BID  . midodrine  10 mg Oral Q M,W,F-HD  . montelukast  10 mg Oral Daily  . pantoprazole  40 mg Oral Q0600  . sodium chloride flush  3 mL Intravenous Q12H   Continuous Infusions: . sodium chloride    . sodium chloride    . sodium chloride    . sodium chloride    . sodium chloride    . cefTRIAXone (ROCEPHIN)  IV 1 g (11/15/20 0956)     LOS: 3 days   Time spent: 35 minutes   Pierre Cumpton Loann Quill, MD [facility]  If 7PM-7AM, please contact night-coverage [email or web address] 11/16/2020, 1:27 PM

## 2020-11-16 NOTE — Transfer of Care (Signed)
Immediate Anesthesia Transfer of Care Note  Patient: Melissa Adams  Procedure(s) Performed: COLONOSCOPY WITH PROPOFOL (N/A ) POLYPECTOMY BIOPSY  Patient Location: Endoscopy Unit  Anesthesia Type:MAC  Level of Consciousness: awake, alert  and oriented  Airway & Oxygen Therapy: Patient Spontanous Breathing and Patient connected to face mask oxygen  Post-op Assessment: Report given to RN and Post -op Vital signs reviewed and stable  Post vital signs: Reviewed and stable  Last Vitals:  Vitals Value Taken Time  BP 60/32 11/16/20 1119  Temp    Pulse 35 11/16/20 1117  Resp 16 11/16/20 1122  SpO2 71 % 11/16/20 1117  Vitals shown include unvalidated device data.  Last Pain:  Vitals:   11/16/20 1115  TempSrc:   PainSc: 0-No pain         Complications: No complications documented.

## 2020-11-16 NOTE — Addendum Note (Signed)
Addendum  created 11/16/20 1351 by Lieutenant Diego, CRNA   Charge Capture section accepted

## 2020-11-16 NOTE — Progress Notes (Signed)
Rechecked VS. Pt asymptomatic. MD aware. Will continue to monitor the pt  Lavenia Atlas, RN

## 2020-11-16 NOTE — Progress Notes (Signed)
Patient successfully swallowed pill cam for small bowel capsule study at 1230 pm. Instructions given to patient and nurse.

## 2020-11-16 NOTE — Op Note (Signed)
[employer] Patient Name: Melissa Adams Procedure Date : 11/16/2020 MRN: [id number] Attending MD: Carlota Raspberry. Havery Moros , MD Date of Birth: Sep 25, 1946 CSN: [id number] Age: 74 Admit Type: Inpatient Procedure:                Colonoscopy Indications:              Gastrointestinal bleeding, Evaluation of                            unexplained GI bleeding presenting with Melena                            (upper GI source has been excluded). On Eliquis                            upon admission, reversed with K centra. Providers:                Carlota Raspberry. Havery Moros, MD, Kary Kos RN, RN, Tyna Jaksch Technician Referring MD:              Medicines:                Monitored Anesthesia Care Complications:            No immediate complications. Estimated blood loss:                            Minimal. Estimated Blood Loss:     Estimated blood loss was minimal. Procedure:                Pre-Anesthesia Assessment:                           - Prior to the procedure, a History and Physical                            was performed, and patient medications and                            allergies were reviewed. The patient's tolerance of                            previous anesthesia was also reviewed. The risks                            and benefits of the procedure and the sedation                            options and risks were discussed with the patient.                            All questions were answered, and informed consent                            was  obtained. Prior Anticoagulants: The patient has                            taken Eliquis (apixaban), last dose was 4 days                            prior to procedure. ASA Grade Assessment: IV - A                            patient with severe systemic disease that is a                            constant threat to life. After reviewing the risks                            and benefits,  the patient was deemed in                            satisfactory condition to undergo the procedure.                           After obtaining informed consent, the colonoscope                            was passed under direct vision. Throughout the                            procedure, the patient's blood pressure, pulse, and                            oxygen saturations were monitored continuously. The                            [id number]-[id number] ([id number]) Olympus pediatric colonoscope                            was introduced through the anus and advanced to the                            the terminal ileum, with identification of the                            appendiceal orifice and IC valve. The colonoscopy                            was performed without difficulty. The patient                            tolerated the procedure well. The quality of the                            bowel preparation was fair. The terminal ileum,  ileocecal valve, appendiceal orifice, and rectum                            were photographed. Scope In: 10:41:00 AM Scope Out: 11:06:53 AM Scope Withdrawal Time: 0 hours 19 minutes 55 seconds  Total Procedure Duration: 0 hours 25 minutes 53 seconds  Findings:      The perianal and digital rectal examinations were normal.      The terminal ileum appeared normal. An old clot was appreciated exiting       the ileum into the cecum, where a few old clots were noted.      Two sessile polyps were found in the cecum. The polyps were 2 to 3 mm in       size. These polyps were removed with a cold snare. Resection and       retrieval were complete.      An area of congested mucosa was found at the ileocecal valve, soft to       palpation. Biopsies were taken with a cold forceps for histology.      A 3 mm polyp was found in the ascending colon. The polyp was sessile.       The polyp was removed with a cold snare. Resection and retrieval were        complete.      Three sessile polyps were found in the transverse colon. The polyps were       3 to 4 mm in size. These polyps were removed with a cold snare.       Resection and retrieval were complete.      Internal hemorrhoids were found during retroflexion. The hemorrhoids       were small.      The exam was otherwise without abnormality. Prep was fair. Several       minutes spent lavaging the colon. Mostly good views obtained, some areas       may have been difficult to appreciated very small polyps, but no clear       pathology noted to cause bleeding symptoms. Brown stool noted throughout       the colon. Impression:               - Preparation of the colon was fair, several                            minutes spent lavaging the colon to achieve                            adequate views.                           - The examined portion of the ileum was normal.                           - Two 2 to 3 mm polyps in the cecum, removed with a                            cold snare. Resected and retrieved.                           - Congested  mucosa at the ileocecal valve. Biopsied.                           - One 3 mm polyp in the ascending colon, removed                            with a cold snare. Resected and retrieved.                           - Three 3 to 4 mm polyps in the transverse colon,                            removed with a cold snare. Resected and retrieved.                           - Internal hemorrhoids.                           - The examination was otherwise normal.                           - Old clots in the cecum.                           Suspect patient has had a small bowel bleed - no                            pathology on EGD or colonoscopy to account for                            bleeding symptoms. Recommend capsule endoscopy to                            clear the small bowel. Recommendation:           - Return patient to hospital ward for ongoing care.                            - NPO if patient willing to proceed with capsule                            endoscopy today, followed by post-capsule diet                           - Continue present medications.                           - Await pathology results.                           - Trend Hgb, monitor for recurrent bleeding,                            continue to hold Eliquis Procedure Code(s):        --- Professional ---  45385, Colonoscopy, flexible; with removal of                            tumor(s), polyp(s), or other lesion(s) by snare                            technique                           45380, 32, Colonoscopy, flexible; with biopsy,                            single or multiple Diagnosis Code(s):        --- Professional ---                           K64.8, Other hemorrhoids                           K63.5, Polyp of colon                           K63.89, Other specified diseases of intestine                           K92.2, Gastrointestinal hemorrhage, unspecified                           K92.1, Melena (includes Hematochezia) CPT copyright 2019 American Medical Association. All rights reserved. The codes documented in this report are preliminary and upon coder review may  be revised to meet current compliance requirements. Remo Lipps P. Merin Borjon, MD 11/16/2020 11:20:19 AM This report has been signed electronically. Number of Addenda: 0

## 2020-11-16 NOTE — Progress Notes (Signed)
ANESTHESIA NOTIFIED AND TREATED

## 2020-11-16 NOTE — Progress Notes (Signed)
Will attempt to recheck pt's VS when pt come back from coloscopy. Pt asymtomatic at this time. MD aware about low bp and MAP.   Lavenia Atlas, RN

## 2020-11-16 NOTE — Anesthesia Postprocedure Evaluation (Signed)
Anesthesia Post Note  Patient: Melissa Adams  Procedure(s) Performed: COLONOSCOPY WITH PROPOFOL (N/A ) POLYPECTOMY BIOPSY GIVENS CAPSULE STUDY (N/A )     Patient location during evaluation: PACU Anesthesia Type: MAC Level of consciousness: awake and alert Pain management: pain level controlled Vital Signs Assessment: post-procedure vital signs reviewed and stable Respiratory status: spontaneous breathing and respiratory function stable Cardiovascular status: stable Postop Assessment: no apparent nausea or vomiting Anesthetic complications: no   No complications documented.  Last Vitals:  Vitals:   11/16/20 1215 11/16/20 1254  BP: (!) 76/45 (!) 79/65  Pulse: 80   Resp:  15  Temp:    SpO2: 96%     Last Pain:  Vitals:   11/16/20 1155  TempSrc:   PainSc: 0-No pain                 Cicilia Clinger DANIEL

## 2020-11-17 ENCOUNTER — Inpatient Hospital Stay (HOSPITAL_COMMUNITY): Payer: Medicare HMO

## 2020-11-17 DIAGNOSIS — I340 Nonrheumatic mitral (valve) insufficiency: Secondary | ICD-10-CM

## 2020-11-17 DIAGNOSIS — Z992 Dependence on renal dialysis: Secondary | ICD-10-CM

## 2020-11-17 DIAGNOSIS — I361 Nonrheumatic tricuspid (valve) insufficiency: Secondary | ICD-10-CM

## 2020-11-17 DIAGNOSIS — N186 End stage renal disease: Secondary | ICD-10-CM

## 2020-11-17 DIAGNOSIS — D3A010 Benign carcinoid tumor of the duodenum: Secondary | ICD-10-CM

## 2020-11-17 DIAGNOSIS — D649 Anemia, unspecified: Secondary | ICD-10-CM

## 2020-11-17 LAB — CBC WITH DIFFERENTIAL/PLATELET
Abs Immature Granulocytes: 0.03 10*3/uL (ref 0.00–0.07)
Basophils Absolute: 0 10*3/uL (ref 0.0–0.1)
Basophils Relative: 0 %
Eosinophils Absolute: 0 10*3/uL (ref 0.0–0.5)
Eosinophils Relative: 0 %
HCT: 29.4 % — ABNORMAL LOW (ref 36.0–46.0)
Hemoglobin: 8.8 g/dL — ABNORMAL LOW (ref 12.0–15.0)
Immature Granulocytes: 0 %
Lymphocytes Relative: 13 %
Lymphs Abs: 1 10*3/uL (ref 0.7–4.0)
MCH: 32.2 pg (ref 26.0–34.0)
MCHC: 29.9 g/dL — ABNORMAL LOW (ref 30.0–36.0)
MCV: 107.7 fL — ABNORMAL HIGH (ref 80.0–100.0)
Monocytes Absolute: 1.1 10*3/uL — ABNORMAL HIGH (ref 0.1–1.0)
Monocytes Relative: 15 %
Neutro Abs: 5.2 10*3/uL (ref 1.7–7.7)
Neutrophils Relative %: 72 %
Platelets: 130 10*3/uL — ABNORMAL LOW (ref 150–400)
RBC: 2.73 MIL/uL — ABNORMAL LOW (ref 3.87–5.11)
RDW: 20.9 % — ABNORMAL HIGH (ref 11.5–15.5)
WBC: 7.3 10*3/uL (ref 4.0–10.5)
nRBC: 1.8 % — ABNORMAL HIGH (ref 0.0–0.2)

## 2020-11-17 LAB — COMPREHENSIVE METABOLIC PANEL WITH GFR
ALT: 39 U/L (ref 0–44)
AST: 46 U/L — ABNORMAL HIGH (ref 15–41)
Albumin: 3.5 g/dL (ref 3.5–5.0)
Alkaline Phosphatase: 101 U/L (ref 38–126)
Anion gap: 14 (ref 5–15)
BUN: 14 mg/dL (ref 8–23)
CO2: 22 mmol/L (ref 22–32)
Calcium: 7.6 mg/dL — ABNORMAL LOW (ref 8.9–10.3)
Chloride: 98 mmol/L (ref 98–111)
Creatinine, Ser: 3.83 mg/dL — ABNORMAL HIGH (ref 0.44–1.00)
GFR, Estimated: 12 mL/min — ABNORMAL LOW
Glucose, Bld: 109 mg/dL — ABNORMAL HIGH (ref 70–99)
Potassium: 3.9 mmol/L (ref 3.5–5.1)
Sodium: 134 mmol/L — ABNORMAL LOW (ref 135–145)
Total Bilirubin: 1.3 mg/dL — ABNORMAL HIGH (ref 0.3–1.2)
Total Protein: 5.5 g/dL — ABNORMAL LOW (ref 6.5–8.1)

## 2020-11-17 LAB — MAGNESIUM: Magnesium: 1.8 mg/dL (ref 1.7–2.4)

## 2020-11-17 LAB — ECHOCARDIOGRAM LIMITED
AV Mean grad: 9 mmHg
AV Peak grad: 18.2 mmHg
Ao pk vel: 2.13 m/s
S' Lateral: 3.6 cm
Weight: 3301.61 [oz_av]

## 2020-11-17 MED ORDER — ALBUMIN HUMAN 25 % IV SOLN
INTRAVENOUS | Status: AC
  Administered 2020-11-17: 25 g
  Filled 2020-11-17: qty 100

## 2020-11-17 MED ORDER — MIDODRINE HCL 5 MG PO TABS
ORAL_TABLET | ORAL | Status: AC
  Filled 2020-11-17: qty 2

## 2020-11-17 MED ORDER — DICLOFENAC SODIUM 1 % EX GEL
2.0000 g | Freq: Four times a day (QID) | CUTANEOUS | Status: DC
  Administered 2020-11-17 – 2020-11-18 (×4): 2 g via TOPICAL
  Filled 2020-11-17: qty 100

## 2020-11-17 MED ORDER — PROSOURCE PLUS PO LIQD
30.0000 mL | Freq: Two times a day (BID) | ORAL | Status: DC
  Administered 2020-11-17 – 2020-11-19 (×3): 30 mL via ORAL
  Filled 2020-11-17 (×4): qty 30

## 2020-11-17 MED ORDER — RENA-VITE PO TABS
1.0000 | ORAL_TABLET | Freq: Every day | ORAL | Status: DC
  Administered 2020-11-17 – 2020-11-18 (×2): 1 via ORAL
  Filled 2020-11-17 (×2): qty 1

## 2020-11-17 MED ORDER — MAGNESIUM SULFATE 2 GM/50ML IV SOLN
2.0000 g | Freq: Once | INTRAVENOUS | Status: AC
  Administered 2020-11-17: 2 g via INTRAVENOUS
  Filled 2020-11-17: qty 50

## 2020-11-17 MED ORDER — IOHEXOL 300 MG/ML  SOLN
100.0000 mL | Freq: Once | INTRAMUSCULAR | Status: AC | PRN
  Administered 2020-11-17: 100 mL via INTRAVENOUS

## 2020-11-17 NOTE — Progress Notes (Signed)
Administered midodrine per order prior to dialysis.   Lavenia Atlas, RN

## 2020-11-17 NOTE — Progress Notes (Signed)
Cardiology Progress Note  Patient ID: Melissa Adams MRN: [id number] DOB: 01/17/1946 Date of Encounter: 11/17/2020  Primary Cardiologist: No primary care provider on file.  Subjective   Chief Complaint:  Fatigue/Ready to go home   HPI: Going to dialysis this morning.  Denies any shortness of breath.  In A. fib.  Blood pressure 70/50.  Apparently this is her baseline.  EGD normal.  Colonoscopy with polyps.  Capsule endoscopy is pending.  Gastroenterology believes she may have a small bowel bleed.  ROS:  All other ROS reviewed and negative. Pertinent positives noted in the HPI.     Inpatient Medications  Scheduled Meds: . sodium chloride   Intravenous Once  . amiodarone  200 mg Oral BID  . Chlorhexidine Gluconate Cloth  6 each Topical Q0600  . cinacalcet  30 mg Oral Q supper  . midodrine  10 mg Oral Q M,W,F-HD  . montelukast  10 mg Oral Daily  . pantoprazole  40 mg Oral Q0600  . sodium chloride flush  3 mL Intravenous Q12H   Continuous Infusions: . sodium chloride    . sodium chloride    . sodium chloride    . sodium chloride 100 mL (11/16/20 1442)  . sodium chloride    . cefTRIAXone (ROCEPHIN)  IV 1 g (11/15/20 0956)   PRN Meds: sodium chloride, sodium chloride, sodium chloride, acetaminophen **OR** acetaminophen, albuterol, lidocaine (PF), lidocaine-prilocaine, pentafluoroprop-tetrafluoroeth, sodium chloride flush   Vital Signs   Vitals:   11/16/20 1900 11/16/20 2320 11/17/20 0428 11/17/20 0429  BP: 107/88 (!) 77/39 (!) 62/42 (!) 72/61  Pulse: 100 90 84 88  Resp: 20 18 20 14   Temp: 97.8 F (36.6 C) 98 F (36.7 C) 98.5 F (36.9 C)   TempSrc: Oral Oral Oral   SpO2: 94% 95% 98% 97%  Weight:        Intake/Output Summary (Last 24 hours) at 11/17/2020 0755 Last data filed at 11/16/2020 2135 Gross per 24 hour  Intake 250 ml  Output --  Net 250 ml   Last 3 Weights 11/15/2020 11/15/2020 11/15/2020  Weight (lbs) 198 lb 10.2 oz 205 lb 4 oz 204 lb  Weight (kg)  90.1 kg 93.1 kg 92.534 kg      Telemetry  Overnight telemetry shows A. fib heart rates 80 to 90 bpm, PVCs noted, which I personally reviewed.   ECG  The most recent ECG shows A. fib with RVR, heart rate 120, PVCs, which I personally reviewed.   Physical Exam   Vitals:   11/16/20 1900 11/16/20 2320 11/17/20 0428 11/17/20 0429  BP: 107/88 (!) 77/39 (!) 62/42 (!) 72/61  Pulse: 100 90 84 88  Resp: 20 18 20 14   Temp: 97.8 F (36.6 C) 98 F (36.7 C) 98.5 F (36.9 C)   TempSrc: Oral Oral Oral   SpO2: 94% 95% 98% 97%  Weight:         Intake/Output Summary (Last 24 hours) at 11/17/2020 0755 Last data filed at 11/16/2020 2135 Gross per 24 hour  Intake 250 ml  Output --  Net 250 ml    Last 3 Weights 11/15/2020 11/15/2020 11/15/2020  Weight (lbs) 198 lb 10.2 oz 205 lb 4 oz 204 lb  Weight (kg) 90.1 kg 93.1 kg 92.534 kg    Body mass index is 35.19 kg/m.  General: Well nourished, well developed, in no acute distress Head: Atraumatic, normal size  Eyes: PEERLA, EOMI  Neck: Supple, no JVD Endocrine: No thryomegaly Cardiac: Normal S1, S2; irregular  rhythm, 2 out of 6 holosystolic murmur Lungs: Diminished breath sounds bilaterally Abd: Soft, nontender, no hepatomegaly  Ext: No edema, pulses 2+ Musculoskeletal: No deformities, BUE and BLE strength normal and equal Skin: Warm and dry, no rashes   Neuro: Alert and oriented to person, place, time, and situation, CNII-XII grossly intact, no focal deficits  Psych: Normal mood and affect   Labs  High Sensitivity Troponin:  No results for input(s): TROPONINIHS in the last 720 hours.   Cardiac EnzymesNo results for input(s): TROPONINI in the last 168 hours. No results for input(s): TROPIPOC in the last 168 hours.  Chemistry Recent Labs  Lab 11/15/20 0122 11/16/20 0127 11/17/20 0155  NA 136 140 134*  K 3.4* 3.7 3.9  CL 96* 100 98  CO2 27 22 22   GLUCOSE 122* 81 109*  BUN 15 7* 14  CREATININE 2.54* 2.24* 3.83*  CALCIUM 7.7* 8.0*  7.6*  PROT 5.3* 6.3* 5.5*  ALBUMIN 3.2* 3.7 3.5  AST 36 56* 46*  ALT 26 40 39  ALKPHOS 100 116 101  BILITOT 1.4* 1.5* 1.3*  GFRNONAA 19* 22* 12*  ANIONGAP 13 18* 14    Hematology Recent Labs  Lab 11/15/20 0122 11/16/20 0127 11/17/20 0155  WBC 6.1 7.9 7.3  RBC 2.46* 2.81* 2.73*  HGB 7.9* 9.5* 8.8*  HCT 24.7* 29.0* 29.4*  MCV 100.4* 103.2* 107.7*  MCH 32.1 33.8 32.2  MCHC 32.0 32.8 29.9*  RDW 18.7* 20.9* 20.9*  PLT 128* 144* 130*   BNPNo results for input(s): BNP, PROBNP in the last 168 hours.  DDimer No results for input(s): DDIMER in the last 168 hours.   Radiology  No results found.  Cardiac Studies  TTE 04/20/2020 1. Left ventricular ejection fraction, by estimation, is 30 to 35%. The  left ventricle has moderate to severely decreased function. The left  ventricle demonstrates global hypokinesis. The left ventricular internal  cavity size was mildly dilated. Left  ventricular diastolic parameters are indeterminate.  2. Right ventricular systolic function is mildly reduced. The right  ventricular size is moderately enlarged. There is mildly elevated  pulmonary artery systolic pressure. D-shaped interventricular septum  consistent with RV pressure/volume overload. PA  systolic pressure 43 mmHg.  3. Left atrial size was moderately dilated.  4. Right atrial size was moderately dilated.  5. The mitral valve is normal in structure. Moderate mitral valve  regurgitation. No evidence of mitral stenosis.  6. Tricuspid valve regurgitation is moderate.  7. The aortic valve is tricuspid. Aortic valve regurgitation is not  visualized. Mild aortic valve sclerosis is present, with no evidence of  aortic valve stenosis.  8. The inferior vena cava is dilated in size with <50% respiratory  variability, suggesting right atrial pressure of 15 mmHg.   Left heart cath [facility] 07/2019 -> normal coronary arteries reported  Patient Profile  Melissa Adams is a 74  y.o. female with systolic heart failure (EF 30-35%), reported severe mitral regurgitation and severe tricuspid regurgitation(discrepancy on echo in May which showed moderate disease), A. 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  Assessment & Plan   1.  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  Reported to have moderate mitral regurgitation and moderate tricuspid regurgitation in May of this year. -She has chronic hypertension and is not on any guideline  directed medical therapy.  Apparently this is not new. -We will hold her beta-blocker.  No ACE/ARB/Arni due to ESRD and hypertension.  She cannot even tolerate hydralazine and Isordil. -Fluid management per HD. -Likely would benefit from aggressive goals of care discussion as limited options for advanced therapies if her heart failure continues to progress. -Digoxin would be ideal but due to ESRD she cannot be on this. -I have recommended a limited echocardiogram in order this.  We will just see what her heart function is.  2.  Permanent atrial fibrillation -Admitted with GI bleed with hemoglobin down to 5.7.  She is not a candidate for anticoagulation at this time. -Continue to hold anticoagulation as GI is pursuing a capsule endoscopy for source of GI bleed.  EGD was normal.  She had nonbleeding polyps on her colonoscopy. -Given hypotension would continue amiodarone 200 mg twice daily for rate control.  We will transition back to beta-blocker once blood pressure improves.  Amiodarone not a good agent for rate control in the setting of permanent A. fib.  However, in the setting of hypotension we will do it until she is more stable. -She was on metoprolol but given her hypotension she is not been given this. -Reported cirrhosis  but kidney function is stable.  Close monitoring on amiodarone. -Not a candidate for rhythm control strategies.  3.  Chronic hypotension on midodrine -Likely worsened in the setting of acute GI bleed.  Continue midodrine. -Repeat limited echo above just to make sure heart function is not worsening. -Limited options for advanced cardiac therapies.  She is not a candidate.  For questions or updates, please contact [place] Please consult [email or web address] for contact info under   Time Spent with Patient: I have spent a total of 25 minutes with patient reviewing hospital notes, telemetry, EKGs, labs and examining the patient as well as establishing an assessment and plan that was discussed with the patient.  > 50% of time was spent in direct patient care.    Signed, Addison Naegeli. Audie Box, [place]  11/17/2020 7:55 AM

## 2020-11-17 NOTE — Progress Notes (Signed)
  Echocardiogram 2D Echocardiogram has been performed.  Melissa Adams 11/17/2020, 2:05 PM

## 2020-11-17 NOTE — Progress Notes (Signed)
PT Cancellation Note  Patient Details Name: Melissa Adams MRN: [id number] DOB: October 24, 1946   Cancelled Treatment:    Reason Eval/Treat Not Completed: (P) Patient at procedure or test/unavailable (Pt off unit for HD, will f/u per POC.)   Ayvin Lipinski J Stann Mainland 11/17/2020, 12:24 PM  Erasmo Leventhal , PTA Acute Rehabilitation Services Pager [phone] Office [phone]

## 2020-11-17 NOTE — Progress Notes (Signed)
OT Cancellation Note  Patient Details Name: Melissa Adams MRN: [id number] DOB: Oct 01, 1946   Cancelled Treatment:    Reason Eval/Treat Not Completed: Patient at procedure or test/ unavailable (Currently in HD, will follow.)  [name] 11/17/2020, 8:24 AM  [name], OTR/L Acute Rehabilitation Services Pager: [phone] Office: [phone]

## 2020-11-17 NOTE — Progress Notes (Signed)
Progress Note   Subjective  Patient has not had any further bleeding. Capsule study done overnight, results as below. She feels tired. No abdominal pains.    Objective   Vital signs in last 24 hours: Temp:  [97.6 F (36.4 C)-98.5 F (36.9 C)] 97.9 F (36.6 C) (12/24 1555) Pulse Rate:  [27-103] 85 (12/24 1555) Resp:  [10-36] 16 (12/24 1555) BP: (62-174)/(35-150) 174/150 (12/24 1555) SpO2:  [50 %-98 %] 96 % (12/24 1555) Weight:  [92.1 kg-93.6 kg] 92.1 kg (12/24 1214) Last BM Date: 11/16/20 General:    AA female in NAD Abdomen:  Soft, nontender and nondistended. Neurologic:  Alert and oriented,  grossly normal neurologically. Psych:  Cooperative. Normal mood and affect.  Intake/Output from previous day: 12/23 0701 - 12/24 0700 In: 250 [P.O.:250] Out: -  Intake/Output this shift: Total I/O In: -  Out: 1300 [Other:1300]  Lab Results: Recent Labs    11/15/20 0122 11/16/20 0127 11/17/20 0155  WBC 6.1 7.9 7.3  HGB 7.9* 9.5* 8.8*  HCT 24.7* 29.0* 29.4*  PLT 128* 144* 130*   BMET Recent Labs    11/15/20 0122 11/16/20 0127 11/17/20 0155  NA 136 140 134*  K 3.4* 3.7 3.9  CL 96* 100 98  CO2 27 22 22   GLUCOSE 122* 81 109*  BUN 15 7* 14  CREATININE 2.54* 2.24* 3.83*  CALCIUM 7.7* 8.0* 7.6*   LFT Recent Labs    11/17/20 0155  PROT 5.5*  ALBUMIN 3.5  AST 46*  ALT 39  ALKPHOS 101  BILITOT 1.3*   PT/INR No results for input(s): LABPROT, INR in the last 72 hours.  Studies/Results: ECHOCARDIOGRAM LIMITED  Result Date: 11/17/2020    ECHOCARDIOGRAM LIMITED REPORT   Patient Name:   Melissa Adams [facility] Date of Exam: 11/17/2020 Medical Rec #:  [id number]         Height:       63.0 in Accession #:    [id number]        Weight:       206.3 lb Date of Birth:  Jan 02, 1946        BSA:          1.960 m Patient Age:    74 years          BP:           92/69 mmHg Patient Gender: F                 HR:           87 bpm. Exam Location:  Inpatient Procedure: Limited Echo,  Cardiac Doppler and Color Doppler                                 MODIFIED REPORT:   This report was modified by Skeet Latch MD on 11/17/2020 due to update                                  aortic valve.  Indications:     Tricuspid valve disease.  History:         Patient has prior history of Echocardiogram examinations, most                  recent 04/20/2020. Arrythmias:Atrial Fibrillation; Risk  Factors:Sleep Apnea. ESRD.  Sonographer:     Clayton Lefort RDCS (AE) Referring Phys:  [id number] [place] Diagnosing Phys: Skeet Latch MD IMPRESSIONS  1. Left ventricular ejection fraction, by estimation, is 35 to 40%. The left ventricle has moderately decreased function. The left ventricle demonstrates global hypokinesis. There is mild concentric left ventricular hypertrophy. Left ventricular diastolic parameters are indeterminate.  2. Right ventricular systolic function is normal. The right ventricular size is normal. There is moderately elevated pulmonary artery systolic pressure.  3. The mitral valve is normal in structure. Mild mitral valve regurgitation. No evidence of mitral stenosis.  4. Tricuspid valve regurgitation is moderate to severe.  5. There is restriction of the right and noncoronary cusps. Visually there appears to be at least moderate aortic stenosis. LVOT VTI was not recorded, so cannot calculate a dimensionless index. Cannot exclude low-flow, low gradient aortic stenosis. The aortic valve is calcified. There is moderate calcification of the aortic valve. There is moderate thickening of the aortic valve. Aortic valve regurgitation is not visualized. Mild to moderate aortic valve sclerosis/calcification is present, without any evidence of aortic stenosis. Aortic valve mean gradient measures 9.0 mmHg. Aortic valve Vmax measures 2.13 m/s.  6. The inferior vena cava is dilated in size with <50% respiratory variability, suggesting right atrial pressure of 15 mmHg. FINDINGS   Left Ventricle: Left ventricular ejection fraction, by estimation, is 35 to 40%. The left ventricle has moderately decreased function. The left ventricle demonstrates global hypokinesis. The left ventricular internal cavity size was normal in size. There is mild concentric left ventricular hypertrophy. Left ventricular diastolic parameters are indeterminate. Right Ventricle: The right ventricular size is normal. No increase in right ventricular wall thickness. Right ventricular systolic function is normal. There is moderately elevated pulmonary artery systolic pressure. The tricuspid regurgitant velocity is 3.05 m/s, and with an assumed right atrial pressure of 15 mmHg, the estimated right ventricular systolic pressure is 97.3 mmHg. Left Atrium: Left atrial size was normal in size. Right Atrium: Right atrial size was normal in size. Pericardium: There is no evidence of pericardial effusion. Mitral Valve: The mitral valve is normal in structure. Mild mitral annular calcification. Mild mitral valve regurgitation. No evidence of mitral valve stenosis. Tricuspid Valve: The tricuspid valve is normal in structure. Tricuspid valve regurgitation is moderate to severe. No evidence of tricuspid stenosis. Aortic Valve: There is restriction of the right and noncoronary cusps. Visually there appears to be at least moderate aortic stenosis. LVOT VTI was not recorded, so cannot calculate a dimensionless index. Cannot exclude low-flow, low gradient aortic stenosis. The aortic valve is calcified. There is moderate calcification of the aortic valve. There is moderate thickening of the aortic valve. Aortic valve regurgitation is not visualized. Mild to moderate aortic valve sclerosis/calcification is present, without any evidence of aortic stenosis. Aortic valve mean gradient measures 9.0 mmHg. Aortic valve peak gradient measures 18.2 mmHg. Pulmonic Valve: The pulmonic valve was normal in structure. Pulmonic valve regurgitation is  trivial. No evidence of pulmonic stenosis. Aorta: The aortic root is normal in size and structure. Venous: The inferior vena cava is dilated in size with less than 50% respiratory variability, suggesting right atrial pressure of 15 mmHg. IAS/Shunts: No atrial level shunt detected by color flow Doppler. LEFT VENTRICLE PLAX 2D LVIDd:         4.70 cm LVIDs:         3.60 cm LV PW:         1.30 cm LV IVS:  1.20 cm LVOT diam:     2.00 cm LVOT Area:     3.14 cm  IVC IVC diam: 2.70 cm LEFT ATRIUM         Index LA diam:    5.30 cm 2.70 cm/m  AORTIC VALVE AV Vmax:      213.30 cm/s AV VTI:       0.409 m AV Peak Grad: 18.2 mmHg AV Mean Grad: 9.0 mmHg  AORTA Ao Root diam: 3.20 cm Ao Asc diam:  3.40 cm TRICUSPID VALVE TR Peak grad:   37.2 mmHg TR Vmax:        305.00 cm/s  SHUNTS Systemic Diam: 2.00 cm Skeet Latch MD Electronically signed by Skeet Latch MD Signature Date/Time: 11/17/2020/2:35:11 PM    Final (Updated)        Assessment / Plan:   74 y/o female with multiple comorbidities to include atrial fibrillation on Eliquis, end-stage renal disease on HD, CHF, cirrhosis, who presented with GI bleed and symptomatic anemia. Eliquis reversed with K Centra on admission. Hgb initially in the 5s, received multiple RBCs, now stablized in the 8s.  Workup as follows: EGD 12/21- no blood in the lumen or cause for bleeding. Incidentally noted benign appearing duodenal nodule. Biopsies c/w carcinoid  Colonoscopy - 12/22 - 6 small polyps removed, congested IC valve, internal hemorrhoids, old blood clot noted exiting ileum into cecum  Capsule study 12/23 - duodenal nodule noted, mid small bowel erosions / mild inflammatory changes with one red spot, no heme or active bleeding. Prep limited in the ileum  Overall, given workup to date, suspect she had a small bowel bleed. She has some patchy inflammatory changes with erosions in the mid small bowel. Does not look like typical Crohn's. Infectious, NSAID  etiologies possible, she denies NSAID use.  She has not had any recurrent bleeding so far. 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 This has potential to spread from the duodenum over time. Long term we can discuss endoscopic removal but no plans for that now given recent bleeding. I recommend a CT scan abdomen / pelvis with contrast to further evaluate / stage in light of this diagnosis. She was agreeable with this. Otherwise, will need to discuss with primary team long term plan for anticoagulation and if / when this should be reintroduced as she is at risk for rebleeding. Would await CT first prior to making that decision. Will reassess after CT.  [place] Cellar, MD [facility]

## 2020-11-17 NOTE — Progress Notes (Signed)
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 fib on Eliquis as an outpatient  Follows with Dr. Bryan Lemma in the outpatient setting-also gets recurrent Thoracentesis for recurrent effusions on the right side by Dr. Silas Flood  Return to the emergency room 12/20 dark tarry stools being lightheaded fatigue epigastric discomfort Found to have hemoglobin 5.7 patient was hypotensive initially-given 1 unit of blood blood pressure above 100. Given Kcentra Protonix octreotide Dr. Havery Moros consulted for likely upper GI bleed  Assessment & Plan:  Acute blood loss anemia/hypovolemic shock: -Unclear etiology at this time.  Either small bowel bleed or colonic bleed.  Hemoglobin 5.7 upon admission. -Status post 2 unit PRBC transfusion.  -Received Kcentra to reverse anticoagulation with Eliquis -H&H remained stable. reviewed upper GI endoscopy-no blood in the lumen or cause of bleeding noted.  Incidentally noted benign appearing duodenal nodule.  Patient became hypotensive at the end of the case. -Patient underwent colonoscopy on 12/23-showed polyps in cecum, ascending colon, transverse colon-resected.  Congested mucosa right ileocecal valve-biopsied-GI recommend capsule endoscopy-which is in process to evaluate for etiology of GI bleed.  Appreciate GI help. -Monitor H&H closely and transfuse as needed.  Chronic A. Fib: -Continue to hold Eliquis. -Rate control medicine on hold due to hypotension. -Continue with amiodarone 200 mg twice daily in the setting of hypotension-switched to metoprolol once her blood pressure is  back to baseline.  Appreciate cardiology's assistance.  ESRD on hemodialysis on MWF -Appreciate nephrology help  Liver cirrhosis/chronic pleural effusions: -Thought to be in the setting of congestive cirrhosis.  Continue Rocephin for SBP prophylaxis. -On 2 L of oxygen as needed -Strict INO's and daily weight.  Chronic systolic CHF/ischemic cardiomyopathy: -Reviewed recent echo.  Ejection fraction of 30 to 35% -Hemodialysis for fluid management.  Continue to hold beta-blocker in the setting of hypotension.  No ACE/ARB in the setting of ESRD and hypertension. -Repeat echo is pending. -Strict INO's and daily weight.  Monitor electrolytes closely.  Chronic hypotension: -On midodrine on dialysis days  Hypokalemia: Replenished.  Repeat potassium level: WNL.  Magnesium: WNL.  Thrombocytopenia: Platelet 130. -Repeat CBC tomorrow AM.  Morbid obesity with BMI of 35: -Diet modification/exercise and weight loss recommended.  DVT prophylaxis: SCD Code Status: Partial Family Communication:  None present at bedside.  Plan of care discussed with patient in length and she verbalized understanding and agreed with it. Disposition Plan: To be determined  Consultants:   Cardiology  GI  Nephrology  Procedures:  EGD CT abdomen/pelvis Hemodialysis Antimicrobials:   Rocephin  Status is: Inpatient  Remains inpatient appropriate because:Ongoing diagnostic testing needed not appropriate for outpatient work up   Dispo: The patient is from: Home              Anticipated d/c is to: Home              Anticipated d/c date is: 3 days              Patient currently is not medically stable to d/c.     Subjective: Patient seen and examined in hemodialysis unit.  Tells me that she is tired of being in the hospital and wishes to  go home to meet her family and grandkids.  Tearful.  Objective: Vitals:   11/17/20 1154 11/17/20 1204 11/17/20 1214 11/17/20 1243  BP:  (!) 87/44 (!) 100/48 92/69   Pulse:    91  Resp: 12 14 16 17   Temp:   97.6 F (36.4 C) 97.7 F (36.5 C)  TempSrc:   Oral Oral  SpO2:   96% 94%  Weight:   92.1 kg     Intake/Output Summary (Last 24 hours) at 11/17/2020 1513 Last data filed at 11/17/2020 1214 Gross per 24 hour  Intake 250 ml  Output 1300 ml  Net -1050 ml   Filed Weights   11/15/20 1750 11/17/20 0749 11/17/20 1214  Weight: 90.1 kg 93.6 kg 92.1 kg    Examination:  General exam: Appears calm and comfortable, communicating well, tearful Respiratory system: Clear to auscultation. Respiratory effort normal. Cardiovascular system: S1 & S2 heard, RRR. No JVD, murmurs, rubs, gallops or clicks. No pedal edema. Gastrointestinal system: Abdomen is nondistended, soft and nontender. No organomegaly or masses felt. Normal bowel sounds heard. Central nervous system: Alert and oriented. No focal neurological deficits. Extremities: Symmetric 5 x 5 power. Skin: No rashes, lesions or ulcers Psychiatry: Judgement and insight appear normal. Mood & affect appropriate.    Data Reviewed: I have personally reviewed following labs and imaging studies  CBC: Recent Labs  Lab 11/14/20 0328 11/14/20 0905 11/15/20 0122 11/16/20 0127 11/17/20 0155  WBC 9.1 9.5 6.1 7.9 7.3  NEUTROABS 6.6  --  4.0 5.6 5.2  HGB 7.3* 7.8* 7.9* 9.5* 8.8*  HCT 21.5* 24.7* 24.7* 29.0* 29.4*  MCV 98.2 101.6* 100.4* 103.2* 107.7*  PLT 146* 166 128* 144* 132*   Basic Metabolic Panel: Recent Labs  Lab 11/13/20 1318 11/14/20 0328 11/15/20 0122 11/16/20 0127 11/17/20 0155  NA 138 135 136 140 134*  K 3.5 4.1 3.4* 3.7 3.9  CL 92* 91* 96* 100 98  CO2 30 29 27 22 22   GLUCOSE 104* 103* 122* 81 109*  BUN 33* 46* 15 7* 14  CREATININE 3.22* 4.17* 2.54* 2.24* 3.83*  CALCIUM 7.7* 7.6* 7.7* 8.0* 7.6*  MG  --  2.0  --  2.0 1.8  PHOS  --  3.6  --   --   --    GFR: Estimated Creatinine Clearance: 13.9 mL/min (A) (by C-G formula based on SCr of 3.83 mg/dL (H)). Liver Function  Tests: Recent Labs  Lab 11/13/20 1318 11/14/20 0328 11/15/20 0122 11/16/20 0127 11/17/20 0155  AST 35 35 36 56* 46*  ALT 28 29 26  40 39  ALKPHOS 115 123 100 116 101  BILITOT 1.5* 1.9* 1.4* 1.5* 1.3*  PROT 5.8* 5.5* 5.3* 6.3* 5.5*  ALBUMIN 3.1* 3.0* 3.2* 3.7 3.5   No results for input(s): LIPASE, AMYLASE in the last 168 hours. No results for input(s): AMMONIA in the last 168 hours. Coagulation Profile: Recent Labs  Lab 11/14/20 0015  INR 1.4*   Cardiac Enzymes: No results for input(s): CKTOTAL, CKMB, CKMBINDEX, TROPONINI in the last 168 hours. BNP (last 3 results) No results for input(s): PROBNP in the last 8760 hours. HbA1C: No results for input(s): HGBA1C in the last 72 hours. CBG: No results for input(s): GLUCAP in the last 168 hours. Lipid Profile: No results for input(s): CHOL, HDL, LDLCALC, TRIG, CHOLHDL, LDLDIRECT in the last 72 hours. Thyroid Function Tests: No results for input(s): TSH, T4TOTAL, FREET4, T3FREE, THYROIDAB in the last 72 hours. Anemia Panel: No results for input(s): VITAMINB12, FOLATE, FERRITIN,  TIBC, IRON, RETICCTPCT in the last 72 hours. Sepsis Labs: No results for input(s): PROCALCITON, LATICACIDVEN in the last 168 hours.  Recent Results (from the past 240 hour(s))  Resp Panel by RT-PCR (Flu A&B, Covid) Nasopharyngeal Swab     Status: None   Collection Time: 11/13/20  8:26 PM   Specimen: Nasopharyngeal Swab; Nasopharyngeal(NP) swabs in vial transport medium  Result Value Ref Range Status   SARS Coronavirus 2 by RT PCR NEGATIVE NEGATIVE Final    Comment: (NOTE) SARS-CoV-2 target nucleic acids are NOT DETECTED.  The SARS-CoV-2 RNA is generally detectable in upper respiratory specimens during the acute phase of infection. The lowest concentration of SARS-CoV-2 viral copies this assay can detect is 138 copies/mL. A negative result does not preclude SARS-Cov-2 infection and should not be used as the sole basis for treatment or other patient  management decisions. A negative result may occur with  improper specimen collection/handling, submission of specimen other than nasopharyngeal swab, presence of viral mutation(s) within the areas targeted by this assay, and inadequate number of viral copies(<138 copies/mL). A negative result must be combined with clinical observations, patient history, and epidemiological information. The expected result is Negative.  Fact Sheet for Patients:  EntrepreneurPulse.com.au  Fact Sheet for Healthcare Providers:  IncredibleEmployment.be  This test is no t yet approved or cleared by the [place] FDA and  has been authorized for detection and/or diagnosis of SARS-CoV-2 by FDA under an Emergency Use Authorization (EUA). This EUA will remain  in effect (meaning this test can be used) for the duration of the COVID-19 declaration under Section 564(b)(1) of the Act, 21 U.S.C.section 360bbb-3(b)(1), unless the authorization is terminated  or revoked sooner.       Influenza A by PCR NEGATIVE NEGATIVE Final   Influenza B by PCR NEGATIVE NEGATIVE Final    Comment: (NOTE) The Xpert Xpress SARS-CoV-2/FLU/RSV plus assay is intended as an aid in the diagnosis of influenza from Nasopharyngeal swab specimens and should not be used as a sole basis for treatment. Nasal washings and aspirates are unacceptable for Xpert Xpress SARS-CoV-2/FLU/RSV testing.  Fact Sheet for Patients: EntrepreneurPulse.com.au  Fact Sheet for Healthcare Providers: IncredibleEmployment.be  This test is not yet approved or cleared by the [place] FDA and has been authorized for detection and/or diagnosis of SARS-CoV-2 by FDA under an Emergency Use Authorization (EUA). This EUA will remain in effect (meaning this test can be used) for the duration of the COVID-19 declaration under Section 564(b)(1) of the Act, 21 U.S.C. section 360bbb-3(b)(1),  unless the authorization is terminated or revoked.  Performed at [facility], [place] [address], [place], [place] [address]       Radiology Studies: ECHOCARDIOGRAM LIMITED  Result Date: 11/17/2020    ECHOCARDIOGRAM LIMITED REPORT   Patient Name:   MARGUETTA WINDISH Date of Exam: 11/17/2020 Medical Rec #:  [id number]         Height:       63.0 in Accession #:    [id number]        Weight:       206.3 lb Date of Birth:  1946-09-20        BSA:          1.960 m Patient Age:    51 years          BP:           92/69 mmHg Patient Gender: F  HR:           87 bpm. Exam Location:  Inpatient Procedure: Limited Echo, Cardiac Doppler and Color Doppler                                 MODIFIED REPORT:   This report was modified by Skeet Latch MD on 11/17/2020 due to update                                  aortic valve.  Indications:     Tricuspid valve disease.  History:         Patient has prior history of Echocardiogram examinations, most                  recent 04/20/2020. Arrythmias:Atrial Fibrillation; Risk                  Factors:Sleep Apnea. ESRD.  Sonographer:     Clayton Lefort RDCS (AE) Referring Phys:  [id number] [place] Diagnosing Phys: Skeet Latch MD IMPRESSIONS  1. Left ventricular ejection fraction, by estimation, is 35 to 40%. The left ventricle has moderately decreased function. The left ventricle demonstrates global hypokinesis. There is mild concentric left ventricular hypertrophy. Left ventricular diastolic parameters are indeterminate.  2. Right ventricular systolic function is normal. The right ventricular size is normal. There is moderately elevated pulmonary artery systolic pressure.  3. The mitral valve is normal in structure. Mild mitral valve regurgitation. No evidence of mitral stenosis.  4. Tricuspid valve regurgitation is moderate to severe.  5. There is restriction of the right and noncoronary cusps. Visually there appears to be at least moderate aortic  stenosis. LVOT VTI was not recorded, so cannot calculate a dimensionless index. Cannot exclude low-flow, low gradient aortic stenosis. The aortic valve is calcified. There is moderate calcification of the aortic valve. There is moderate thickening of the aortic valve. Aortic valve regurgitation is not visualized. Mild to moderate aortic valve sclerosis/calcification is present, without any evidence of aortic stenosis. Aortic valve mean gradient measures 9.0 mmHg. Aortic valve Vmax measures 2.13 m/s.  6. The inferior vena cava is dilated in size with <50% respiratory variability, suggesting right atrial pressure of 15 mmHg. FINDINGS  Left Ventricle: Left ventricular ejection fraction, by estimation, is 35 to 40%. The left ventricle has moderately decreased function. The left ventricle demonstrates global hypokinesis. The left ventricular internal cavity size was normal in size. There is mild concentric left ventricular hypertrophy. Left ventricular diastolic parameters are indeterminate. Right Ventricle: The right ventricular size is normal. No increase in right ventricular wall thickness. Right ventricular systolic function is normal. There is moderately elevated pulmonary artery systolic pressure. The tricuspid regurgitant velocity is 3.05 m/s, and with an assumed right atrial pressure of 15 mmHg, the estimated right ventricular systolic pressure is 63.7 mmHg. Left Atrium: Left atrial size was normal in size. Right Atrium: Right atrial size was normal in size. Pericardium: There is no evidence of pericardial effusion. Mitral Valve: The mitral valve is normal in structure. Mild mitral annular calcification. Mild mitral valve regurgitation. No evidence of mitral valve stenosis. Tricuspid Valve: The tricuspid valve is normal in structure. Tricuspid valve regurgitation is moderate to severe. No evidence of tricuspid stenosis. Aortic Valve: There is restriction of the right and noncoronary cusps. Visually there appears  to be at least moderate aortic stenosis.  LVOT VTI was not recorded, so cannot calculate a dimensionless index. Cannot exclude low-flow, low gradient aortic stenosis. The aortic valve is calcified. There is moderate calcification of the aortic valve. There is moderate thickening of the aortic valve. Aortic valve regurgitation is not visualized. Mild to moderate aortic valve sclerosis/calcification is present, without any evidence of aortic stenosis. Aortic valve mean gradient measures 9.0 mmHg. Aortic valve peak gradient measures 18.2 mmHg. Pulmonic Valve: The pulmonic valve was normal in structure. Pulmonic valve regurgitation is trivial. No evidence of pulmonic stenosis. Aorta: The aortic root is normal in size and structure. Venous: The inferior vena cava is dilated in size with less than 50% respiratory variability, suggesting right atrial pressure of 15 mmHg. IAS/Shunts: No atrial level shunt detected by color flow Doppler. LEFT VENTRICLE PLAX 2D LVIDd:         4.70 cm LVIDs:         3.60 cm LV PW:         1.30 cm LV IVS:        1.20 cm LVOT diam:     2.00 cm LVOT Area:     3.14 cm  IVC IVC diam: 2.70 cm LEFT ATRIUM         Index LA diam:    5.30 cm 2.70 cm/m  AORTIC VALVE AV Vmax:      213.30 cm/s AV VTI:       0.409 m AV Peak Grad: 18.2 mmHg AV Mean Grad: 9.0 mmHg  AORTA Ao Root diam: 3.20 cm Ao Asc diam:  3.40 cm TRICUSPID VALVE TR Peak grad:   37.2 mmHg TR Vmax:        305.00 cm/s  SHUNTS Systemic Diam: 2.00 cm Skeet Latch MD Electronically signed by Skeet Latch MD Signature Date/Time: 11/17/2020/2:35:11 PM    Final (Updated)     Scheduled Meds:  (feeding supplement) PROSource Plus  30 mL Oral BID BM   sodium chloride   Intravenous Once   amiodarone  200 mg Oral BID   Chlorhexidine Gluconate Cloth  6 each Topical Q0600   cinacalcet  30 mg Oral Q supper   midodrine  10 mg Oral Q M,W,F-HD   montelukast  10 mg Oral Daily   multivitamin  1 tablet Oral QHS   pantoprazole  40 mg  Oral Q0600   sodium chloride flush  3 mL Intravenous Q12H   Continuous Infusions:  sodium chloride     sodium chloride     sodium chloride     sodium chloride 100 mL (11/16/20 1442)   sodium chloride     cefTRIAXone (ROCEPHIN)  IV 1 g (11/17/20 1256)     LOS: 4 days   Time spent: 35 minutes   Dilan Fullenwider Loann Quill, MD [facility]  If 7PM-7AM, please contact night-coverage [email or web address] 11/17/2020, 3:13 PM

## 2020-11-17 NOTE — Progress Notes (Signed)
[facility] Progress Note   Subjective: Seen on HD, very irritable, doesn't feel well Says she has not slept since Sunday. She C/Os of pain in AVF arm, upset because of ulcerated areas on AVF. Has been seen by [facility]-Dr. Trula Slade. No intervention planned.  BP low at start of treatment. Didn't receive midodrine and now unfortunately given albumin. Emotional support to patient.   Objective Vitals:   11/17/20 0428 11/17/20 0429 11/17/20 0749 11/17/20 0802  BP: (!) 62/42 (!) 72/61 (!) 87/36 (!) 78/35  Pulse: 84 88 (!) 47 (!) 49  Resp: 20 14    Temp: 98.5 F (36.9 C)  97.8 F (36.6 C)   TempSrc: Oral  Oral   SpO2: 98% 97%    Weight:   93.6 kg    Physical Exam General: Chronically ill appearing female in NAD Heart: AFib on monitor. Irreg, irreg. S1,S2 No M/R/G Lungs: decreased in bases otherwise CTAB Abdomen: Active BS Extremities: 1+BLE edema Dialysis Access: AVF with thinning area lower portion of AVF-cannulated away from area.     Additional Objective Labs: Basic Metabolic Panel: Recent Labs  Lab 11/14/20 0328 11/15/20 0122 11/16/20 0127 11/17/20 0155  NA 135 136 140 134*  K 4.1 3.4* 3.7 3.9  CL 91* 96* 100 98  CO2 29 27 22 22   GLUCOSE 103* 122* 81 109*  BUN 46* 15 7* 14  CREATININE 4.17* 2.54* 2.24* 3.83*  CALCIUM 7.6* 7.7* 8.0* 7.6*  PHOS 3.6  --   --   --    Liver Function Tests: Recent Labs  Lab 11/15/20 0122 11/16/20 0127 11/17/20 0155  AST 36 56* 46*  ALT 26 40 39  ALKPHOS 100 116 101  BILITOT 1.4* 1.5* 1.3*  PROT 5.3* 6.3* 5.5*  ALBUMIN 3.2* 3.7 3.5   No results for input(s): LIPASE, AMYLASE in the last 168 hours. CBC: Recent Labs  Lab 11/14/20 0328 11/14/20 0905 11/15/20 0122 11/16/20 0127 11/17/20 0155  WBC 9.1 9.5 6.1 7.9 7.3  NEUTROABS 6.6  --  4.0 5.6 5.2  HGB 7.3* 7.8* 7.9* 9.5* 8.8*  HCT 21.5* 24.7* 24.7* 29.0* 29.4*  MCV 98.2 101.6* 100.4* 103.2* 107.7*  PLT 146* 166 128* 144* 130*   Blood Culture    Component  Value Date/Time   SDES PLEURAL FLUID 06/11/2020 0959   SDES PLEURAL FLUID 06/11/2020 0959   SPECREQUEST NONE 06/11/2020 0959   SPECREQUEST NONE 06/11/2020 0959   CULT  06/11/2020 0959    NO GROWTH 5 DAYS Performed at [facility], [place] [address] [place], [place] [address]    REPTSTATUS 06/11/2020 FINAL 06/11/2020 0959   REPTSTATUS 06/16/2020 FINAL 06/11/2020 0959    Cardiac Enzymes: No results for input(s): CKTOTAL, CKMB, CKMBINDEX, TROPONINI in the last 168 hours. CBG: No results for input(s): GLUCAP in the last 168 hours. Iron Studies: No results for input(s): IRON, TIBC, TRANSFERRIN, FERRITIN in the last 72 hours. @lablastinr3 @ Studies/Results: No results found. Medications: . sodium chloride    . sodium chloride    . sodium chloride    . sodium chloride 100 mL (11/16/20 1442)  . sodium chloride    . cefTRIAXone (ROCEPHIN)  IV 1 g (11/15/20 0956)   . sodium chloride   Intravenous Once  . amiodarone  200 mg Oral BID  . Chlorhexidine Gluconate Cloth  6 each Topical Q0600  . cinacalcet  30 mg Oral Q supper  . midodrine  10 mg Oral Q M,W,F-HD  . montelukast  10 mg Oral Daily  .  pantoprazole  40 mg Oral Q0600  . sodium chloride flush  3 mL Intravenous Q12H     Dialysis Orders: Center:[facility] MWF 4 hr 180NRe 450/800 92 kg 2.0K/2.25 Ca UFP 2 L AVF -No heparin -Hectorol 1 mcg IV TIW -Mircera 60 mcg IV q 2 weeks (last dose 11/13/2020) -Venofer 50 mg IV weekly (Last dose 11/13/2020)   Assessment/Plan: 1. Upper GIB-H/O tarry stools. HGB 5.7 on admission. Now S/P 3 units PRBCs. Last HGB 7.8--> 9.5 12/23/AM. GI consulted. EGD 12/21 negative, c-scope negative, capsule study in progress. HGB 8.8 this AM.  2. AFib on Eliquis-was given Kcentra for reversal of Eliquis. Anticoagulation on hold. No heparin with HD.  3. HFrEF-last ECHO 04/21/19 EF 30-35% with global hypokinesis. Volume managed with HD. Mildly reduced RV function. Moderate TR/MR regurgitation.  4. Recurrent  pleural effusions-followed by Pulmonary.  5. ESRD - MWF last HD 12/20 left 1 kg above OP EDW, has received multiple units of blood and IVF. S/p extra HD 12/21, normal schedule 12/22.  HD today on schedule. . 6. Ulcerations on AVF/Issues with bleeding post HD-[facility] consulted.Seen by Dr. Trula Slade. No intervention planned.  7. Hypertension/volume - BP soft and variable. Uses midodrine 10 mg PO TIW with HD but was given albumin instead.Attempting Net 3 liters today. UF as tolerated.  8. Anemia - As noted in # 1. Follow HGB, transfuse as needed. 9. Metabolic bone disease - OP labs all at goal 11/08/2020. At goal here. Continue VDRA, binders when able to eat. 10. Nutrition -Albumin has fallen now 3.0. Add protein supps, renal vit when eating.  Chantrell Apsey H. Shakoya Gilmore NP-C 11/17/2020, 8:21 AM  [employer] [phone]

## 2020-11-17 NOTE — Progress Notes (Addendum)
Pt coming back to rm 1 from dialysis. Reinitiated tele. VSS. Call bell within reach.   Lavenia Atlas, RN

## 2020-11-17 NOTE — Procedures (Signed)
Patient seen on Hemodialysis. BP (!) 94/48 (BP Location: Right Arm)   Pulse 62   Temp 97.8 F (36.6 C) (Oral)   Resp 14   Wt 93.6 kg   SpO2 97%   BMI 36.55 kg/m   QB 350, UF goal 2L Tolerating treatment without complaints at this time.  Elmarie Shiley MD [facility]. Office # [phone] Pager # [phone] 9:06 AM

## 2020-11-18 ENCOUNTER — Encounter (HOSPITAL_COMMUNITY): Payer: Self-pay | Admitting: Gastroenterology

## 2020-11-18 ENCOUNTER — Inpatient Hospital Stay (HOSPITAL_COMMUNITY): Payer: Medicare HMO

## 2020-11-18 LAB — CBC
HCT: 27.2 % — ABNORMAL LOW (ref 36.0–46.0)
Hemoglobin: 8.6 g/dL — ABNORMAL LOW (ref 12.0–15.0)
MCH: 33 pg (ref 26.0–34.0)
MCHC: 31.6 g/dL (ref 30.0–36.0)
MCV: 104.2 fL — ABNORMAL HIGH (ref 80.0–100.0)
Platelets: 131 10*3/uL — ABNORMAL LOW (ref 150–400)
RBC: 2.61 MIL/uL — ABNORMAL LOW (ref 3.87–5.11)
RDW: 20.4 % — ABNORMAL HIGH (ref 11.5–15.5)
WBC: 6.8 10*3/uL (ref 4.0–10.5)
nRBC: 0.6 % — ABNORMAL HIGH (ref 0.0–0.2)

## 2020-11-18 LAB — BODY FLUID CELL COUNT WITH DIFFERENTIAL
Eos, Fluid: 0 %
Lymphs, Fluid: 69 %
Monocyte-Macrophage-Serous Fluid: 18 % — ABNORMAL LOW (ref 50–90)
Neutrophil Count, Fluid: 13 % (ref 0–25)
Total Nucleated Cell Count, Fluid: 554 cu mm (ref 0–1000)

## 2020-11-18 LAB — GLUCOSE, PLEURAL OR PERITONEAL FLUID: Glucose, Fluid: 109 mg/dL

## 2020-11-18 LAB — GRAM STAIN

## 2020-11-18 LAB — PROTEIN, PLEURAL OR PERITONEAL FLUID: Total protein, fluid: 3 g/dL

## 2020-11-18 LAB — LACTATE DEHYDROGENASE, PLEURAL OR PERITONEAL FLUID: LD, Fluid: 114 U/L — ABNORMAL HIGH (ref 3–23)

## 2020-11-18 LAB — MAGNESIUM: Magnesium: 2 mg/dL (ref 1.7–2.4)

## 2020-11-18 MED ORDER — LIDOCAINE HCL (PF) 1 % IJ SOLN
INTRAMUSCULAR | Status: AC
  Filled 2020-11-18: qty 30

## 2020-11-18 MED ORDER — MIDODRINE HCL 5 MG PO TABS
10.0000 mg | ORAL_TABLET | Freq: Every day | ORAL | Status: DC
  Administered 2020-11-18 – 2020-11-19 (×2): 10 mg via ORAL
  Filled 2020-11-18 (×2): qty 2

## 2020-11-18 NOTE — Progress Notes (Addendum)
Progress Note  Patient Name: Melissa Adams Date of Encounter: 11/18/2020  Primary Cardiologist: [name], MD  Subjective   No c/p or dyspnea.  Just wants to go home.  Inpatient Medications    Scheduled Meds: . (feeding supplement) PROSource Plus  30 mL Oral BID BM  . sodium chloride   Intravenous Once  . amiodarone  200 mg Oral BID  . Chlorhexidine Gluconate Cloth  6 each Topical Q0600  . cinacalcet  30 mg Oral Q supper  . diclofenac Sodium  2 g Topical QID  . midodrine  10 mg Oral Daily  . montelukast  10 mg Oral Daily  . multivitamin  1 tablet Oral QHS  . pantoprazole  40 mg Oral Q0600  . sodium chloride flush  3 mL Intravenous Q12H   Continuous Infusions: . sodium chloride    . sodium chloride    . sodium chloride    . sodium chloride 100 mL (11/16/20 1442)  . sodium chloride    . cefTRIAXone (ROCEPHIN)  IV 1 g (11/17/20 1256)   PRN Meds: sodium chloride, sodium chloride, sodium chloride, acetaminophen **OR** acetaminophen, albuterol, lidocaine (PF), lidocaine-prilocaine, pentafluoroprop-tetrafluoroeth, sodium chloride flush   Vital Signs    Vitals:   11/17/20 1931 11/17/20 2314 11/18/20 0348 11/18/20 0827  BP: (!) 77/38 (!) 74/54 (!) 74/47 (!) 74/43  Pulse: 99 96 (!) 59 93  Resp: 15 19 (!) 21 20  Temp: 98 F (36.7 C) 98.1 F (36.7 C) 98 F (36.7 C) 98 F (36.7 C)  TempSrc: Oral Oral Oral Oral  SpO2: 98% 95% 94% 94%  Weight:        Intake/Output Summary (Last 24 hours) at 11/18/2020 0925 Last data filed at 11/17/2020 1214 Gross per 24 hour  Intake --  Output 1300 ml  Net -1300 ml   Filed Weights   11/15/20 1750 11/17/20 0749 11/17/20 1214  Weight: 90.1 kg 93.6 kg 92.1 kg    Physical Exam   GEN: Well nourished, well developed, in no acute distress.  HEENT: Grossly normal.  Neck: Supple, no JVD, carotid bruits, or masses. Cardiac: IR, IR, 2/6 holosystolic murmur throughout.  No rubs, or gallops. No clubbing, cyanosis, 2+ bilat LE  edema to knees.  Radials 2+, DP/PT 2+ and equal bilaterally.  Respiratory:  Respirations regular and unlabored, markedly diminished breath sounds @ right base- 1/3 way up, otw CTA. GI: Soft, nontender, nondistended, BS + x 4. MS: no deformity or atrophy. Skin: warm and dry, no rash. Neuro:  Strength and sensation are intact. Psych: AAOx3.  Normal affect.  Labs    Chemistry Recent Labs  Lab 11/15/20 0122 11/16/20 0127 11/17/20 0155  NA 136 140 134*  K 3.4* 3.7 3.9  CL 96* 100 98  CO2 27 22 22   GLUCOSE 122* 81 109*  BUN 15 7* 14  CREATININE 2.54* 2.24* 3.83*  CALCIUM 7.7* 8.0* 7.6*  PROT 5.3* 6.3* 5.5*  ALBUMIN 3.2* 3.7 3.5  AST 36 56* 46*  ALT 26 40 39  ALKPHOS 100 116 101  BILITOT 1.4* 1.5* 1.3*  GFRNONAA 19* 22* 12*  ANIONGAP 13 18* 14     Hematology Recent Labs  Lab 11/16/20 0127 11/17/20 0155 11/18/20 0102  WBC 7.9 7.3 6.8  RBC 2.81* 2.73* 2.61*  HGB 9.5* 8.8* 8.6*  HCT 29.0* 29.4* 27.2*  MCV 103.2* 107.7* 104.2*  MCH 33.8 32.2 33.0  MCHC 32.8 29.9* 31.6  RDW 20.9* 20.9* 20.4*  PLT 144* 130* 131*  Lipids  Lab Results  Component Value Date   CHOL 163 10/24/2020   HDL 60 10/24/2020   LDLCALC 93 10/24/2020   TRIG 51 10/24/2020   CHOLHDL 2.7 10/24/2020    HbA1c  Lab Results  Component Value Date   HGBA1C 5.2 06/22/2020    Radiology    CT ABDOMEN PELVIS W CONTRAST  Result Date: 11/17/2020 CLINICAL DATA:  GI bleed.  Duodenal carcinoid. EXAM: CT ABDOMEN AND PELVIS WITH CONTRAST TECHNIQUE: Multidetector CT imaging of the abdomen and pelvis was performed using the standard protocol following bolus administration of intravenous contrast. CONTRAST:  140mL OMNIPAQUE IOHEXOL 300 MG/ML  SOLN COMPARISON:  None FINDINGS: Lower chest: There is a large, partially visualized right-sided pleural effusion.There appears to be complete collapse of the right lower lobe. There is significant cardiomegaly. Hepatobiliary: The liver is normal. Cholelithiasis without  acute inflammation.There is no biliary ductal dilation. Pancreas: Normal contours without ductal dilatation. No peripancreatic fluid collection. Spleen: Unremarkable. Adrenals/Urinary Tract: --Adrenal glands: There is a left adrenal nodule measuring approximately 1.8 cm (axial series 3, image 22). --Right kidney/ureter: No hydronephrosis or radiopaque kidney stones. --Left kidney/ureter: No hydronephrosis or radiopaque kidney stones. --Urinary bladder: Unremarkable. Stomach/Bowel: --Stomach/Duodenum: No hiatal hernia or other gastric abnormality. Normal duodenal course and caliber. --Small bowel: Unremarkable. --Colon: Unremarkable. --Appendix: Normal. Vascular/Lymphatic: Atherosclerotic calcification is present within the non-aneurysmal abdominal aorta, without hemodynamically significant stenosis. --No retroperitoneal lymphadenopathy. --No mesenteric lymphadenopathy. --No pelvic or inguinal lymphadenopathy. Reproductive: Status post hysterectomy. No adnexal mass. Other: There is a moderate volume of free fluid the patient's pelvis. There is a small volume of free fluid in the upper abdomen. The abdominal wall is normal. Musculoskeletal. No acute displaced fractures. IMPRESSION: 1. No acute abdominopelvic abnormality. 2. Large, partially visualized right-sided pleural effusion with complete collapse of the right lower lobe. 3. Cholelithiasis without acute inflammation. 4. Indeterminate left adrenal nodule measuring approximately 1.8 cm. Given the patient's history, a follow-up outpatient adrenal protocol CT is recommended. Aortic Atherosclerosis (ICD10-I70.0). Electronically Signed   By: Constance Holster M.D.   On: 11/17/2020 22:49   DG CHEST PORT 1 VIEW  Result Date: 11/15/2020 CLINICAL DATA:  Pneumonia.  Follow-up. EXAM: PORTABLE CHEST 1 VIEW COMPARISON:  11/13/2020 FINDINGS: Cardiomegaly as seen previously. Right effusion with volume loss in the right lower lung, similar to the previous exam. Left lung is  clear. IMPRESSION: No change. Right effusion with volume loss in the right lower lung. Cardiomegaly. Electronically Signed   By: Keshaun Dubey Chimes M.D.   On: 11/15/2020 08:11   Telemetry    Afib, pvc's, 90's to 130's  Cardiac Studies   TTE 04/20/2020  1. Left ventricular ejection fraction, by estimation, is 30 to 35%. The  left ventricle has moderate to severely decreased function. The left  ventricle demonstrates global hypokinesis. The left ventricular internal  cavity size was mildly dilated. Left  ventricular diastolic parameters are indeterminate.   2. Right ventricular systolic function is mildly reduced. The right  ventricular size is moderately enlarged. There is mildly elevated  pulmonary artery systolic pressure. D-shaped interventricular septum  consistent with RV pressure/volume overload. PA  systolic pressure 43 mmHg.   3. Left atrial size was moderately dilated.   4. Right atrial size was moderately dilated.   5. The mitral valve is normal in structure. Moderate mitral valve  regurgitation. No evidence of mitral stenosis.   6. Tricuspid valve regurgitation is moderate.   7. The aortic valve is tricuspid. Aortic valve regurgitation is not  visualized.  Mild aortic valve sclerosis is present, with no evidence of  aortic valve stenosis.   8. The inferior vena cava is dilated in size with <50% respiratory  variability, suggesting right atrial pressure of 15 mmHg.    Left heart cath [facility] 07/2019 -> normal coronary arteries reported   Patient Profile     Melissa Adams is a 74 y.o. female with systolic heart failure (EF 30-35%), reported severe mitral regurgitation and severe tricuspid regurgitation(discrepancy on echo in May which showed moderate disease), A. 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    Assessment & Plan    1.  Acute blood loss anemia/hypovolemic shock: Status post transfusions this admission with stable H&H of 8.6 and 27.2 currently.  Eliquis on hold.  Capsule endoscopy pending as upper and lower GI unremarkable.  2.  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  Denies dyspnea.  Diminished breath sounds at right base in the setting of pleural effusion but otherwise clear to auscultation.  She does have 2+ lower extremity edema on examination this morning.  Management per nephrology/hemodialysis.  3.  Permanent AFib:  Prev rate controlled on toprol xl 37.5mg  BID, but ongoing hypotension has prevented [place] from using, thus she is currently on amio 200 bid for the purposes of rate control only.  Rates mostly trending in the 90's to 1 teens.  With history of cirrhosis, will have to follow LFTs closely on amiodarone therapy.  Hopefully this is a short-term solution.  Eliquis on hold in the setting of #1.  4.  Chronic hypotension:  On midodrine but prev tolerated toprol xl @ home.  Pressures cont to trend in the 70's, thus  blocker on hold.  5.  Right pleural effusion: Status post thoracentesis in September.  Chest x-ray December 22 showed volume loss in the right lower lung.  She moves very little air on that side.  Question if she might benefit from repeat thoracentesis while she is off of Eliquis, as dyspnea will contribute to elevated heart rates.  6.  ESRD:  HD per nephrology.  Signed, Murray Hodgkins, NP  11/18/2020, 9:25 AM    For questions or updates, please contact   Please consult [email or web address] for contact info under Cardiology/STEMI.   The patient was seen, examined and discussed with Ignacia Bayley, NP and agree as above.  Chronic persistent atrial fibrillation, now with ventricular rates 98-117 BPM, in the settings of severe anemia and hypovolemia, Continue amiodarone for rate control, unable to use AVN blocking  agents se to hypotension and digoxin sec to ESRD. She has LE edema, I would apply compression socks. LVEF is 30 to 35%, her blood remains low, but she is asymptomatic. H&H of 8.6 and 27.2 today.  Eliquis on hold.  Capsule endoscopy pending as upper and lower GI unremarkable.  Ena Dawley, MD 11/18/2020

## 2020-11-18 NOTE — Procedures (Signed)
PROCEDURE SUMMARY:  Successful US guided right thoracentesis. Yielded 1.3 of hazy orange fluid. Patient tolerated procedure well. No immediate complications. EBL = trace  Specimen was sent for labs.  Post procedure chest X-ray pending.  Sevannah Madia S Blaise Palladino PA-C 11/18/2020 11:29 AM

## 2020-11-18 NOTE — Progress Notes (Signed)
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 fib on Eliquis as an outpatient  Follows with Dr. Bryan Lemma in the outpatient setting-also gets recurrent Thoracentesis for recurrent effusions on the right side by Dr. Silas Flood  Return to the emergency room 12/20 dark tarry stools being lightheaded fatigue epigastric discomfort Found to have hemoglobin 5.7 patient was hypotensive initially-given 1 unit of blood blood pressure above 100. Given Kcentra Protonix octreotide Dr. Havery Moros consulted for likely upper GI bleed  Assessment & Plan:  Acute blood loss anemia/hypovolemic shock: -Unclear etiology at this time.  Either small bowel bleed or colonic bleed.  Hemoglobin 5.7 upon admission. -Status post 2 unit PRBC transfusion.  -Received Kcentra to reverse anticoagulation with Eliquis -H&H remained stable. reviewed upper GI endoscopy-no blood in the lumen or cause of bleeding noted.  Incidentally noted benign appearing duodenal nodule.   -Patient underwent colonoscopy on 12/23-showed polyps in cecum, ascending colon, transverse colon-resected.  Congested mucosa right ileocecal valve-biopsied, old blood clot noted exiting ileum into the cecum. 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 -CT abdomen/pelvis: Negative for any mass.  No plans for endoscopic removal of carcinoid at this time  Right-sided pleural effusion: -Reviewed CT abdomen/pelvis from 12/24 lyes right-sided pleural  effusion with complete collapse of right lower lobe.  She had thoracentesis in September. -Discussed with the patient and she agreed with thoracentesis this time. -Consulted IR  Chronic A. Fib: -Continue to hold Eliquis. -Rate control medicine on hold due to hypotension. -Continue with amiodarone 200 mg twice daily in the setting of hypotension-switched to metoprolol once her blood pressure is back to baseline.  Appreciate cardiology's assistance.  ESRD on hemodialysis on MWF -Appreciate nephrology help  Liver cirrhosis: -Thought to be in the setting of congestive cirrhosis.  Continue Rocephin for SBP prophylaxis. -On 2 L of oxygen as needed -Strict INO's and daily weight.  Acute on chronic systolic CHF/ischemic cardiomyopathy: -Reviewed recent echo.  Ejection fraction of 30 to 35% -Hemodialysis for fluid management.  Continue to hold beta-blocker in the setting of hypotension.  No ACE/ARB in the setting of ESRD and hypertension. -Repeat echo is pending. -Strict INO's and daily weight.  Monitor electrolytes closely.  Chronic hypotension: -On midodrine on dialysis days  Hypokalemia: Replenished.  Repeat potassium level: WNL.  Magnesium: WNL.  Thrombocytopenia: Platelet 130. -Repeat CBC tomorrow AM.  Morbid obesity with BMI of 35: -Diet modification/exercise and weight loss recommended.  DVT prophylaxis: SCD Code Status: Partial Family Communication:  None present at bedside.  Plan of care discussed with patient in length and she verbalized understanding and agreed with it. Disposition Plan: To be determined  Consultants:   Cardiology  GI  Nephrology  Procedures:  EGD Colonoscopy Capsule endoscopy CT abdomen/pelvis Hemodialysis Antimicrobials:   Rocephin  Status is: Inpatient  Remains inpatient appropriate because:Ongoing diagnostic testing needed not appropriate for outpatient work up   Dispo: The patient is from: Home              Anticipated d/c is to:  Home  Anticipated d/c date is: 3 days              Patient currently is not medically stable to d/c.     Subjective: Patient seen and examined.  Tells me that she has shortness of breath with lying flat and taking few steps.  She denies chest pain.  Discussed about pleural effusion-agreeable for thoracentesis.  Tearful that she is not with her family on Christmas day  Objective: Vitals:   11/17/20 1931 11/17/20 2314 11/18/20 0348 11/18/20 0827  BP: (!) 77/38 (!) 74/54 (!) 74/47 (!) 74/43  Pulse: 99 96 (!) 59 93  Resp: 15 19 (!) 21 20  Temp: 98 F (36.7 C) 98.1 F (36.7 C) 98 F (36.7 C) 98 F (36.7 C)  TempSrc: Oral Oral Oral Oral  SpO2: 98% 95% 94% 94%  Weight:        Intake/Output Summary (Last 24 hours) at 11/18/2020 1001 Last data filed at 11/17/2020 1214 Gross per 24 hour  Intake --  Output 1300 ml  Net -1300 ml   Filed Weights   11/15/20 1750 11/17/20 0749 11/17/20 1214  Weight: 90.1 kg 93.6 kg 92.1 kg    Examination:  General exam: Appears calm and comfortable, communicating well, tearful Respiratory system: Decreased breath sound noted on right side of lung.  No wheezing rhonchi or crackles. Cardiovascular system: S1 & S2 heard, RRR. No JVD, murmurs, rubs, gallops or clicks. No pedal edema. Gastrointestinal system: Abdomen is nondistended, soft and nontender. No organomegaly or masses felt. Normal bowel sounds heard. Central nervous system: Alert and oriented. No focal neurological deficits. Extremities: Symmetric 5 x 5 power. Skin: No rashes, lesions or ulcers Psychiatry: Judgement and insight appear normal. Mood & affect appropriate.    Data Reviewed: I have personally reviewed following labs and imaging studies  CBC: Recent Labs  Lab 11/14/20 0328 11/14/20 0905 11/15/20 0122 11/16/20 0127 11/17/20 0155 11/18/20 0102  WBC 9.1 9.5 6.1 7.9 7.3 6.8  NEUTROABS 6.6  --  4.0 5.6 5.2  --   HGB 7.3* 7.8* 7.9* 9.5* 8.8* 8.6*  HCT 21.5*  24.7* 24.7* 29.0* 29.4* 27.2*  MCV 98.2 101.6* 100.4* 103.2* 107.7* 104.2*  PLT 146* 166 128* 144* 130* 160*   Basic Metabolic Panel: Recent Labs  Lab 11/13/20 1318 11/14/20 0328 11/15/20 0122 11/16/20 0127 11/17/20 0155 11/18/20 0102  NA 138 135 136 140 134*  --   K 3.5 4.1 3.4* 3.7 3.9  --   CL 92* 91* 96* 100 98  --   CO2 30 29 27 22 22   --   GLUCOSE 104* 103* 122* 81 109*  --   BUN 33* 46* 15 7* 14  --   CREATININE 3.22* 4.17* 2.54* 2.24* 3.83*  --   CALCIUM 7.7* 7.6* 7.7* 8.0* 7.6*  --   MG  --  2.0  --  2.0 1.8 2.0  PHOS  --  3.6  --   --   --   --    GFR: Estimated Creatinine Clearance: 13.9 mL/min (A) (by C-G formula based on SCr of 3.83 mg/dL (H)). Liver Function Tests: Recent Labs  Lab 11/13/20 1318 11/14/20 0328 11/15/20 0122 11/16/20 0127 11/17/20 0155  AST 35 35 36 56* 46*  ALT 28 29 26  40 39  ALKPHOS 115 123 100 116 101  BILITOT 1.5* 1.9* 1.4* 1.5* 1.3*  PROT 5.8* 5.5* 5.3* 6.3* 5.5*  ALBUMIN 3.1* 3.0* 3.2* 3.7 3.5   No results for input(s): LIPASE, AMYLASE  in the last 168 hours. No results for input(s): AMMONIA in the last 168 hours. Coagulation Profile: Recent Labs  Lab 11/14/20 0015  INR 1.4*   Cardiac Enzymes: No results for input(s): CKTOTAL, CKMB, CKMBINDEX, TROPONINI in the last 168 hours. BNP (last 3 results) No results for input(s): PROBNP in the last 8760 hours. HbA1C: No results for input(s): HGBA1C in the last 72 hours. CBG: No results for input(s): GLUCAP in the last 168 hours. Lipid Profile: No results for input(s): CHOL, HDL, LDLCALC, TRIG, CHOLHDL, LDLDIRECT in the last 72 hours. Thyroid Function Tests: No results for input(s): TSH, T4TOTAL, FREET4, T3FREE, THYROIDAB in the last 72 hours. Anemia Panel: No results for input(s): VITAMINB12, FOLATE, FERRITIN, TIBC, IRON, RETICCTPCT in the last 72 hours. Sepsis Labs: No results for input(s): PROCALCITON, LATICACIDVEN in the last 168 hours.  Recent Results (from the past 240  hour(s))  Resp Panel by RT-PCR (Flu A&B, Covid) Nasopharyngeal Swab     Status: None   Collection Time: 11/13/20  8:26 PM   Specimen: Nasopharyngeal Swab; Nasopharyngeal(NP) swabs in vial transport medium  Result Value Ref Range Status   SARS Coronavirus 2 by RT PCR NEGATIVE NEGATIVE Final    Comment: (NOTE) SARS-CoV-2 target nucleic acids are NOT DETECTED.  The SARS-CoV-2 RNA is generally detectable in upper respiratory specimens during the acute phase of infection. The lowest concentration of SARS-CoV-2 viral copies this assay can detect is 138 copies/mL. A negative result does not preclude SARS-Cov-2 infection and should not be used as the sole basis for treatment or other patient management decisions. A negative result may occur with  improper specimen collection/handling, submission of specimen other than nasopharyngeal swab, presence of viral mutation(s) within the areas targeted by this assay, and inadequate number of viral copies(<138 copies/mL). A negative result must be combined with clinical observations, patient history, and epidemiological information. The expected result is Negative.  Fact Sheet for Patients:  EntrepreneurPulse.com.au  Fact Sheet for Healthcare Providers:  IncredibleEmployment.be  This test is no t yet approved or cleared by the [place] FDA and  has been authorized for detection and/or diagnosis of SARS-CoV-2 by FDA under an Emergency Use Authorization (EUA). This EUA will remain  in effect (meaning this test can be used) for the duration of the COVID-19 declaration under Section 564(b)(1) of the Act, 21 U.S.C.section 360bbb-3(b)(1), unless the authorization is terminated  or revoked sooner.       Influenza A by PCR NEGATIVE NEGATIVE Final   Influenza B by PCR NEGATIVE NEGATIVE Final    Comment: (NOTE) The Xpert Xpress SARS-CoV-2/FLU/RSV plus assay is intended as an aid in the diagnosis of influenza from  Nasopharyngeal swab specimens and should not be used as a sole basis for treatment. Nasal washings and aspirates are unacceptable for Xpert Xpress SARS-CoV-2/FLU/RSV testing.  Fact Sheet for Patients: EntrepreneurPulse.com.au  Fact Sheet for Healthcare Providers: IncredibleEmployment.be  This test is not yet approved or cleared by the [place] FDA and has been authorized for detection and/or diagnosis of SARS-CoV-2 by FDA under an Emergency Use Authorization (EUA). This EUA will remain in effect (meaning this test can be used) for the duration of the COVID-19 declaration under Section 564(b)(1) of the Act, 21 U.S.C. section 360bbb-3(b)(1), unless the authorization is terminated or revoked.  Performed at [facility], [place] [address] [place], [place] [address]       Radiology Studies: CT ABDOMEN PELVIS W CONTRAST  Result Date: 11/17/2020 CLINICAL DATA:  GI bleed.  Duodenal carcinoid. EXAM: CT ABDOMEN AND PELVIS WITH CONTRAST TECHNIQUE: Multidetector CT imaging of the abdomen and pelvis was performed using the standard protocol following bolus administration of intravenous contrast. CONTRAST:  168mL OMNIPAQUE IOHEXOL 300 MG/ML  SOLN COMPARISON:  None FINDINGS: Lower chest: There is a large, partially visualized right-sided pleural effusion.There appears to be complete collapse of the right lower lobe. There is significant cardiomegaly. Hepatobiliary: The liver is normal. Cholelithiasis without acute inflammation.There is no biliary ductal dilation. Pancreas: Normal contours without ductal dilatation. No peripancreatic fluid collection. Spleen: Unremarkable. Adrenals/Urinary Tract: --Adrenal glands: There is a left adrenal nodule measuring approximately 1.8 cm (axial series 3, image 22). --Right kidney/ureter: No hydronephrosis or radiopaque kidney stones. --Left kidney/ureter: No hydronephrosis or radiopaque kidney stones. --Urinary bladder:  Unremarkable. Stomach/Bowel: --Stomach/Duodenum: No hiatal hernia or other gastric abnormality. Normal duodenal course and caliber. --Small bowel: Unremarkable. --Colon: Unremarkable. --Appendix: Normal. Vascular/Lymphatic: Atherosclerotic calcification is present within the non-aneurysmal abdominal aorta, without hemodynamically significant stenosis. --No retroperitoneal lymphadenopathy. --No mesenteric lymphadenopathy. --No pelvic or inguinal lymphadenopathy. Reproductive: Status post hysterectomy. No adnexal mass. Other: There is a moderate volume of free fluid the patient's pelvis. There is a small volume of free fluid in the upper abdomen. The abdominal wall is normal. Musculoskeletal. No acute displaced fractures. IMPRESSION: 1. No acute abdominopelvic abnormality. 2. Large, partially visualized right-sided pleural effusion with complete collapse of the right lower lobe. 3. Cholelithiasis without acute inflammation. 4. Indeterminate left adrenal nodule measuring approximately 1.8 cm. Given the patient's history, a follow-up outpatient adrenal protocol CT is recommended. Aortic Atherosclerosis (ICD10-I70.0). Electronically Signed   By: Constance Holster M.D.   On: 11/17/2020 22:49   ECHOCARDIOGRAM LIMITED  Result Date: 11/17/2020    ECHOCARDIOGRAM LIMITED REPORT   Patient Name:   Melissa Adams Date of Exam: 11/17/2020 Medical Rec #:  [id number]         Height:       63.0 in Accession #:    [id number]        Weight:       206.3 lb Date of Birth:  1946/04/15        BSA:          1.960 m Patient Age:    55 years          BP:           92/69 mmHg Patient Gender: F                 HR:           87 bpm. Exam Location:  Inpatient Procedure: Limited Echo, Cardiac Doppler and Color Doppler                                 MODIFIED REPORT:   This report was modified by Skeet Latch MD on 11/17/2020 due to update                                  aortic valve.  Indications:     Tricuspid valve disease.   History:         Patient has prior history of Echocardiogram examinations, most                  recent 04/20/2020. Arrythmias:Atrial Fibrillation; Risk  Factors:Sleep Apnea. ESRD.  Sonographer:     Clayton Lefort RDCS (AE) Referring Phys:  [id number] [place] Diagnosing Phys: Skeet Latch MD IMPRESSIONS  1. Left ventricular ejection fraction, by estimation, is 35 to 40%. The left ventricle has moderately decreased function. The left ventricle demonstrates global hypokinesis. There is mild concentric left ventricular hypertrophy. Left ventricular diastolic parameters are indeterminate.  2. Right ventricular systolic function is normal. The right ventricular size is normal. There is moderately elevated pulmonary artery systolic pressure.  3. The mitral valve is normal in structure. Mild mitral valve regurgitation. No evidence of mitral stenosis.  4. Tricuspid valve regurgitation is moderate to severe.  5. There is restriction of the right and noncoronary cusps. Visually there appears to be at least moderate aortic stenosis. LVOT VTI was not recorded, so cannot calculate a dimensionless index. Cannot exclude low-flow, low gradient aortic stenosis. The aortic valve is calcified. There is moderate calcification of the aortic valve. There is moderate thickening of the aortic valve. Aortic valve regurgitation is not visualized. Mild to moderate aortic valve sclerosis/calcification is present, without any evidence of aortic stenosis. Aortic valve mean gradient measures 9.0 mmHg. Aortic valve Vmax measures 2.13 m/s.  6. The inferior vena cava is dilated in size with <50% respiratory variability, suggesting right atrial pressure of 15 mmHg. FINDINGS  Left Ventricle: Left ventricular ejection fraction, by estimation, is 35 to 40%. The left ventricle has moderately decreased function. The left ventricle demonstrates global hypokinesis. The left ventricular internal cavity size was normal in size. There  is mild concentric left ventricular hypertrophy. Left ventricular diastolic parameters are indeterminate. Right Ventricle: The right ventricular size is normal. No increase in right ventricular wall thickness. Right ventricular systolic function is normal. There is moderately elevated pulmonary artery systolic pressure. The tricuspid regurgitant velocity is 3.05 m/s, and with an assumed right atrial pressure of 15 mmHg, the estimated right ventricular systolic pressure is 19.5 mmHg. Left Atrium: Left atrial size was normal in size. Right Atrium: Right atrial size was normal in size. Pericardium: There is no evidence of pericardial effusion. Mitral Valve: The mitral valve is normal in structure. Mild mitral annular calcification. Mild mitral valve regurgitation. No evidence of mitral valve stenosis. Tricuspid Valve: The tricuspid valve is normal in structure. Tricuspid valve regurgitation is moderate to severe. No evidence of tricuspid stenosis. Aortic Valve: There is restriction of the right and noncoronary cusps. Visually there appears to be at least moderate aortic stenosis. LVOT VTI was not recorded, so cannot calculate a dimensionless index. Cannot exclude low-flow, low gradient aortic stenosis. The aortic valve is calcified. There is moderate calcification of the aortic valve. There is moderate thickening of the aortic valve. Aortic valve regurgitation is not visualized. Mild to moderate aortic valve sclerosis/calcification is present, without any evidence of aortic stenosis. Aortic valve mean gradient measures 9.0 mmHg. Aortic valve peak gradient measures 18.2 mmHg. Pulmonic Valve: The pulmonic valve was normal in structure. Pulmonic valve regurgitation is trivial. No evidence of pulmonic stenosis. Aorta: The aortic root is normal in size and structure. Venous: The inferior vena cava is dilated in size with less than 50% respiratory variability, suggesting right atrial pressure of 15 mmHg. IAS/Shunts: No  atrial level shunt detected by color flow Doppler. LEFT VENTRICLE PLAX 2D LVIDd:         4.70 cm LVIDs:         3.60 cm LV PW:         1.30 cm LV IVS:  1.20 cm LVOT diam:     2.00 cm LVOT Area:     3.14 cm  IVC IVC diam: 2.70 cm LEFT ATRIUM         Index LA diam:    5.30 cm 2.70 cm/m  AORTIC VALVE AV Vmax:      213.30 cm/s AV VTI:       0.409 m AV Peak Grad: 18.2 mmHg AV Mean Grad: 9.0 mmHg  AORTA Ao Root diam: 3.20 cm Ao Asc diam:  3.40 cm TRICUSPID VALVE TR Peak grad:   37.2 mmHg TR Vmax:        305.00 cm/s  SHUNTS Systemic Diam: 2.00 cm Skeet Latch MD Electronically signed by Skeet Latch MD Signature Date/Time: 11/17/2020/2:35:11 PM    Final (Updated)     Scheduled Meds:  (feeding supplement) PROSource Plus  30 mL Oral BID BM   sodium chloride   Intravenous Once   amiodarone  200 mg Oral BID   Chlorhexidine Gluconate Cloth  6 each Topical Q0600   cinacalcet  30 mg Oral Q supper   diclofenac Sodium  2 g Topical QID   midodrine  10 mg Oral Daily   montelukast  10 mg Oral Daily   multivitamin  1 tablet Oral QHS   pantoprazole  40 mg Oral Q0600   sodium chloride flush  3 mL Intravenous Q12H   Continuous Infusions:  sodium chloride     sodium chloride     sodium chloride     sodium chloride 100 mL (11/16/20 1442)   sodium chloride     cefTRIAXone (ROCEPHIN)  IV 1 g (11/18/20 0932)     LOS: 5 days   Time spent: 35 minutes   Sequoyah Ramone Loann Quill, MD [facility]  If 7PM-7AM, please contact night-coverage [email or web address] 11/18/2020, 10:01 AM

## 2020-11-18 NOTE — Progress Notes (Signed)
CT this evening showed a large right sided pleural effusion and complete collapse of right lower lobe.  Pt is in no distress.  On room air and O2 sats at 97%. States she does get short of breath when moves and very little sound auscultated in the right lower quadrant.  Informed on call physician.  Will continue to monitor.  Lupita Dawn, RN

## 2020-11-18 NOTE — Progress Notes (Signed)
Progress Note   Subjective  Patient eating well. No blood per rectum. No abdominal pain. Had thoracentesis today for large pleural effusion noted on CT. Feeling better.    Objective   Vital signs in last 24 hours: Temp:  [97.6 F (36.4 C)-98.1 F (36.7 C)] 97.6 F (36.4 C) (12/25 1158) Pulse Rate:  [59-99] 76 (12/25 1158) Resp:  [15-21] 15 (12/25 1158) BP: (74-174)/(38-150) 78/42 (12/25 1158) SpO2:  [94 %-98 %] 96 % (12/25 1158) Last BM Date: 11/16/20 General:    AA female in NAD Exam unchanged. Neurologic:  Alert and oriented,  grossly normal neurologically. Psych:  Cooperative. Normal mood and affect.  Intake/Output from previous day: 12/24 0701 - 12/25 0700 In: -  Out: 1300  Intake/Output this shift: No intake/output data recorded.  Lab Results: Recent Labs    11/16/20 0127 11/17/20 0155 11/18/20 0102  WBC 7.9 7.3 6.8  HGB 9.5* 8.8* 8.6*  HCT 29.0* 29.4* 27.2*  PLT 144* 130* 131*   BMET Recent Labs    11/16/20 0127 11/17/20 0155  NA 140 134*  K 3.7 3.9  CL 100 98  CO2 22 22  GLUCOSE 81 109*  BUN 7* 14  CREATININE 2.24* 3.83*  CALCIUM 8.0* 7.6*   LFT Recent Labs    11/17/20 0155  PROT 5.5*  ALBUMIN 3.5  AST 46*  ALT 39  ALKPHOS 101  BILITOT 1.3*   PT/INR No results for input(s): LABPROT, INR in the last 72 hours.  Studies/Results: DG Chest 1 View  Result Date: 11/18/2020 CLINICAL DATA:  Status post right thoracentesis EXAM: CHEST  1 VIEW COMPARISON:  12/17/2019, CT from 11/18/2019 FINDINGS: Cardiac shadow remains enlarged in size. Right-sided pleural effusion has reduced significantly following thoracentesis. No pneumothorax is noted. No bony abnormality is seen. IMPRESSION: No evidence of pneumothorax following thoracentesis. Electronically Signed   By: Inez Catalina M.D.   On: 11/18/2020 12:00   CT ABDOMEN PELVIS W CONTRAST  Result Date: 11/17/2020 CLINICAL DATA:  GI bleed.  Duodenal carcinoid. EXAM: CT ABDOMEN AND PELVIS WITH  CONTRAST TECHNIQUE: Multidetector CT imaging of the abdomen and pelvis was performed using the standard protocol following bolus administration of intravenous contrast. CONTRAST:  180mL OMNIPAQUE IOHEXOL 300 MG/ML  SOLN COMPARISON:  None FINDINGS: Lower chest: There is a large, partially visualized right-sided pleural effusion.There appears to be complete collapse of the right lower lobe. There is significant cardiomegaly. Hepatobiliary: The liver is normal. Cholelithiasis without acute inflammation.There is no biliary ductal dilation. Pancreas: Normal contours without ductal dilatation. No peripancreatic fluid collection. Spleen: Unremarkable. Adrenals/Urinary Tract: --Adrenal glands: There is a left adrenal nodule measuring approximately 1.8 cm (axial series 3, image 22). --Right kidney/ureter: No hydronephrosis or radiopaque kidney stones. --Left kidney/ureter: No hydronephrosis or radiopaque kidney stones. --Urinary bladder: Unremarkable. Stomach/Bowel: --Stomach/Duodenum: No hiatal hernia or other gastric abnormality. Normal duodenal course and caliber. --Small bowel: Unremarkable. --Colon: Unremarkable. --Appendix: Normal. Vascular/Lymphatic: Atherosclerotic calcification is present within the non-aneurysmal abdominal aorta, without hemodynamically significant stenosis. --No retroperitoneal lymphadenopathy. --No mesenteric lymphadenopathy. --No pelvic or inguinal lymphadenopathy. Reproductive: Status post hysterectomy. No adnexal mass. Other: There is a moderate volume of free fluid the patient's pelvis. There is a small volume of free fluid in the upper abdomen. The abdominal wall is normal. Musculoskeletal. No acute displaced fractures. IMPRESSION: 1. No acute abdominopelvic abnormality. 2. Large, partially visualized right-sided pleural effusion with complete collapse of the right lower lobe. 3. Cholelithiasis without acute inflammation. 4. Indeterminate left adrenal nodule  measuring approximately 1.8 cm.  Given the patient's history, a follow-up outpatient adrenal protocol CT is recommended. Aortic Atherosclerosis (ICD10-I70.0). Electronically Signed   By: Constance Holster M.D.   On: 11/17/2020 22:49   ECHOCARDIOGRAM LIMITED  Result Date: 11/17/2020    ECHOCARDIOGRAM LIMITED REPORT   Patient Name:   Melissa Adams Date of Exam: 11/17/2020 Medical Rec #:  [id number]         Height:       63.0 in Accession #:    [id number]        Weight:       206.3 lb Date of Birth:  [birth year]        BSA:          1.960 m Patient Age:    74 years          BP:           92/69 mmHg Patient Gender: F                 HR:           87 bpm. Exam Location:  Inpatient Procedure: Limited Echo, Cardiac Doppler and Color Doppler                                 MODIFIED REPORT:   This report was modified by Skeet Latch MD on 11/17/2020 due to update                                  aortic valve.  Indications:     Tricuspid valve disease.  History:         Patient has prior history of Echocardiogram examinations, most                  recent 04/20/2020. Arrythmias:Atrial Fibrillation; Risk                  Factors:Sleep Apnea. ESRD.  Sonographer:     Clayton Lefort RDCS (AE) Referring Phys:  [id number] Horace Diagnosing Phys: Skeet Latch MD IMPRESSIONS  1. Left ventricular ejection fraction, by estimation, is 35 to 40%. The left ventricle has moderately decreased function. The left ventricle demonstrates global hypokinesis. There is mild concentric left ventricular hypertrophy. Left ventricular diastolic parameters are indeterminate.  2. Right ventricular systolic function is normal. The right ventricular size is normal. There is moderately elevated pulmonary artery systolic pressure.  3. The mitral valve is normal in structure. Mild mitral valve regurgitation. No evidence of mitral stenosis.  4. Tricuspid valve regurgitation is moderate to severe.  5. There is restriction of the right and noncoronary cusps. Visually  there appears to be at least moderate aortic stenosis. LVOT VTI was not recorded, so cannot calculate a dimensionless index. Cannot exclude low-flow, low gradient aortic stenosis. The aortic valve is calcified. There is moderate calcification of the aortic valve. There is moderate thickening of the aortic valve. Aortic valve regurgitation is not visualized. Mild to moderate aortic valve sclerosis/calcification is present, without any evidence of aortic stenosis. Aortic valve mean gradient measures 9.0 mmHg. Aortic valve Vmax measures 2.13 m/s.  6. The inferior vena cava is dilated in size with <50% respiratory variability, suggesting right atrial pressure of 15 mmHg. FINDINGS  Left Ventricle: Left ventricular ejection fraction, by estimation, is 35 to 40%. The left ventricle has moderately decreased  function. The left ventricle demonstrates global hypokinesis. The left ventricular internal cavity size was normal in size. There is mild concentric left ventricular hypertrophy. Left ventricular diastolic parameters are indeterminate. Right Ventricle: The right ventricular size is normal. No increase in right ventricular wall thickness. Right ventricular systolic function is normal. There is moderately elevated pulmonary artery systolic pressure. The tricuspid regurgitant velocity is 3.05 m/s, and with an assumed right atrial pressure of 15 mmHg, the estimated right ventricular systolic pressure is 16.0 mmHg. Left Atrium: Left atrial size was normal in size. Right Atrium: Right atrial size was normal in size. Pericardium: There is no evidence of pericardial effusion. Mitral Valve: The mitral valve is normal in structure. Mild mitral annular calcification. Mild mitral valve regurgitation. No evidence of mitral valve stenosis. Tricuspid Valve: The tricuspid valve is normal in structure. Tricuspid valve regurgitation is moderate to severe. No evidence of tricuspid stenosis. Aortic Valve: There is restriction of the right  and noncoronary cusps. Visually there appears to be at least moderate aortic stenosis. LVOT VTI was not recorded, so cannot calculate a dimensionless index. Cannot exclude low-flow, low gradient aortic stenosis. The aortic valve is calcified. There is moderate calcification of the aortic valve. There is moderate thickening of the aortic valve. Aortic valve regurgitation is not visualized. Mild to moderate aortic valve sclerosis/calcification is present, without any evidence of aortic stenosis. Aortic valve mean gradient measures 9.0 mmHg. Aortic valve peak gradient measures 18.2 mmHg. Pulmonic Valve: The pulmonic valve was normal in structure. Pulmonic valve regurgitation is trivial. No evidence of pulmonic stenosis. Aorta: The aortic root is normal in size and structure. Venous: The inferior vena cava is dilated in size with less than 50% respiratory variability, suggesting right atrial pressure of 15 mmHg. IAS/Shunts: No atrial level shunt detected by color flow Doppler. LEFT VENTRICLE PLAX 2D LVIDd:         4.70 cm LVIDs:         3.60 cm LV PW:         1.30 cm LV IVS:        1.20 cm LVOT diam:     2.00 cm LVOT Area:     3.14 cm  IVC IVC diam: 2.70 cm LEFT ATRIUM         Index LA diam:    5.30 cm 2.70 cm/m  AORTIC VALVE AV Vmax:      213.30 cm/s AV VTI:       0.409 m AV Peak Grad: 18.2 mmHg AV Mean Grad: 9.0 mmHg  AORTA Ao Root diam: 3.20 cm Ao Asc diam:  3.40 cm TRICUSPID VALVE TR Peak grad:   37.2 mmHg TR Vmax:        305.00 cm/s  SHUNTS Systemic Diam: 2.00 cm Skeet Latch MD Electronically signed by Skeet Latch MD Signature Date/Time: 11/17/2020/2:35:11 PM    Final (Updated)    US THORACENTESIS ASP PLEURAL SPACE W/IMG GUIDE  Result Date: 11/18/2020 INDICATION: End-stage renal disease on hemodialysis, congestive heart failure, and right pleural effusion. Request for diagnostic and therapeutic thoracentesis. EXAM: ULTRASOUND GUIDED RIGHT THORACENTESIS MEDICATIONS: 1% lidocaine 8 mL COMPLICATIONS:  None immediate. PROCEDURE: An ultrasound guided thoracentesis was thoroughly discussed with the patient and questions answered. The benefits, risks, alternatives and complications were also discussed. The patient understands and wishes to proceed with the procedure. Written consent was obtained. Ultrasound was performed to localize and mark an adequate pocket of fluid in the right chest. The area was then prepped and draped in the  normal sterile fashion. 1% Lidocaine was used for local anesthesia. Under ultrasound guidance a 6 Fr Safe-T-Centesis catheter was introduced. Thoracentesis was performed. The catheter was removed and a dressing applied. FINDINGS: A total of approximately 1.3 L of hazy orange fluid was removed. Samples were sent to the laboratory as requested by the clinical team. IMPRESSION: Successful ultrasound guided right thoracentesis yielding 1.3 L of pleural fluid. No pneumothorax on post-procedure chest x-ray. Read by: Gareth Eagle, PA-C Electronically Signed   By: Markus Daft M.D.   On: 11/18/2020 12:00       Assessment / Plan:   74 y/o female with multiple comorbidities to include atrial fibrillation on Eliquis, end-stage renal disease on HD, CHF, pleural effusion, suspected cirrhosis,whopresented with GI bleed and symptomatic anemia. Eliquis reversed with K Centra on admission. Hgb initially in the 5s, received multiple RBCs, now stablized in the 8s.  Workup as follows: EGD 12/21- no blood in the lumen or cause for bleeding. Incidentally noted benign appearing duodenal nodule. Biopsies c/w carcinoid  Colonoscopy - 12/22 - 6 small polyps removed, congested IC valve, internal hemorrhoids, old blood clot noted exiting ileum into cecum  Capsule study 12/23 - duodenal nodule noted, mid small bowel erosions / mild inflammatory changes with one red spot, no heme or active bleeding. Prep limited in the ileum  CT scan 11/17/20 - large R sided pleural effusion, no significant abdominal  pathology noted - interestingly liver reported to be normal.  Overall, given workup to date, she had a small bowel bleed. She has some patchy inflammatory changes with erosions in the mid small bowel. Does not look like typical Crohn's. Infectious, NSAID etiologies possible, she denies NSAID use. 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 This has potential to spread from the duodenum over time. Long term we discussed possible endoscopic removal but no plans for that now given recent bleeding.  Moving forward, she is at risk for rebleeding with resumption of Eliquis or other blood thinner. In this acute setting with recent significant bleeding I would hold it for at least a week and see how she does. She will need to discuss with her cardiology team risks / benefits of long term anticoagulation, risks of stroke vs. recurrent bleeding.  I think stable for discharge from bleeding perspective once her other issues have stabalized. We can coordinate follow up with Dr. Bryan Lemma as outpatient for long term plan regarding duodenal carcincoid, and follow up blood draws for her anemia.  We will sign off for now, call with questions moving forward.  Carrington Cellar, MD [facility]

## 2020-11-18 NOTE — Progress Notes (Signed)
Patient ID: ERNESTO LASHWAY, female   DOB: [birth year], 74 y.o.   MRN: [id number]  [facility] Progress Note   Assessment/ Plan:   1.  Acute blood loss anemia with history of melena: Underwent evaluation with colonoscopy and EGD earlier today for unrevealing.  She thereafter had capsule endoscopy indicative of suspected small bowel bleeding with patchy inflammatory changes/erosions in the mid small bowel.  She does have a duodenal nodule consistent with carcinoid that will be endoscopically removed down the road.  She has not had recurrent melena and hemoglobin level showing a slow downward drift. 2. ESRD: On hemodialysis on a Monday/Wednesday/Friday schedule and underwent uneventful dialysis yesterday.  She does not have any acute indications for dialysis at this time and will undergo elective dialysis on Monday. 3.  Paroxysmal atrial fibrillation: On chronic anticoagulation with Eliquis-currently on hold secondary to GI bleed with KCentra administration for reversal.  Hemodialysis without heparin. 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 5. Nutrition: Continue renal diet with fluid restriction 6. Hypotension: Relative hypotension noted but patient asymptomatic; will change midodrine to daily in a.m.  Subjective:   Reports that she is feeling well and wants to go home.  Has not had any additional edema.   Objective:   BP (!) 74/47 (BP Location: Right Arm)   Pulse (!) 59   Temp 98 F (36.7 C) (Oral)   Resp (!) 21   Wt 92.1 kg   SpO2 94%   BMI 35.97 kg/m   Physical Exam: Gen: Appears comfortable sitting in recliner, getting vital signs CVS: Pulse irregular, S1 and S2 normal Resp: Clear to auscultation bilaterally, no distinct rales or rhonchi Abd: Soft, flat, nontender and bowel sounds are normal Ext: Trace lower extremity edema.  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  Labs: BMET Recent Labs  Lab 11/13/20 1318 11/14/20 0328 11/15/20 0122 11/16/20 0127 11/17/20 0155  NA 138 135 136 140 134*  K 3.5 4.1 3.4* 3.7 3.9  CL 92* 91* 96* 100 98  CO2 30 29 27 22 22   GLUCOSE 104* 103* 122* 81 109*  BUN 33* 46* 15 7* 14  CREATININE 3.22* 4.17* 2.54* 2.24* 3.83*  CALCIUM 7.7* 7.6* 7.7* 8.0* 7.6*  PHOS  --  3.6  --   --   --    CBC Recent Labs  Lab 11/14/20 0328 11/14/20 0905 11/15/20 0122 11/16/20 0127 11/17/20 0155 11/18/20 0102  WBC 9.1   < > 6.1 7.9 7.3 6.8  NEUTROABS 6.6  --  4.0 5.6 5.2  --   HGB 7.3*   < > 7.9* 9.5* 8.8* 8.6*  HCT 21.5*   < > 24.7* 29.0* 29.4* 27.2*  MCV 98.2   < > 100.4* 103.2* 107.7* 104.2*  PLT 146*   < > 128* 144* 130* 131*   < > = values in this interval not displayed.     Medications:    . (feeding supplement) PROSource Plus  30 mL Oral BID BM  . sodium chloride   Intravenous Once  . amiodarone  200 mg Oral BID  . Chlorhexidine Gluconate Cloth  6 each Topical Q0600  . cinacalcet  30 mg Oral Q supper  . diclofenac Sodium  2 g Topical QID  . midodrine  10 mg Oral Q M,W,F-HD  . montelukast  10 mg Oral Daily  . multivitamin  1 tablet Oral QHS  . pantoprazole  40 mg Oral Q0600  . sodium chloride flush  3 mL Intravenous Q12H   Elmarie Shiley, MD 11/18/2020, 8:19 AM

## 2020-11-19 DIAGNOSIS — G4730 Sleep apnea, unspecified: Secondary | ICD-10-CM

## 2020-11-19 DIAGNOSIS — I5033 Acute on chronic diastolic (congestive) heart failure: Secondary | ICD-10-CM

## 2020-11-19 DIAGNOSIS — K922 Gastrointestinal hemorrhage, unspecified: Secondary | ICD-10-CM

## 2020-11-19 LAB — COMPREHENSIVE METABOLIC PANEL
ALT: 49 U/L — ABNORMAL HIGH (ref 0–44)
AST: 49 U/L — ABNORMAL HIGH (ref 15–41)
Albumin: 3.3 g/dL — ABNORMAL LOW (ref 3.5–5.0)
Alkaline Phosphatase: 111 U/L (ref 38–126)
Anion gap: 12 (ref 5–15)
BUN: 22 mg/dL (ref 8–23)
CO2: 24 mmol/L (ref 22–32)
Calcium: 7.2 mg/dL — ABNORMAL LOW (ref 8.9–10.3)
Chloride: 97 mmol/L — ABNORMAL LOW (ref 98–111)
Creatinine, Ser: 5.12 mg/dL — ABNORMAL HIGH (ref 0.44–1.00)
GFR, Estimated: 8 mL/min — ABNORMAL LOW (ref >60)
Glucose, Bld: 121 mg/dL — ABNORMAL HIGH (ref 70–99)
Potassium: 4.2 mmol/L (ref 3.5–5.1)
Sodium: 133 mmol/L — ABNORMAL LOW (ref 135–145)
Total Bilirubin: 1.1 mg/dL (ref 0.3–1.2)
Total Protein: 5.3 g/dL — ABNORMAL LOW (ref 6.5–8.1)

## 2020-11-19 LAB — CBC
HCT: 25.6 % — ABNORMAL LOW (ref 36.0–46.0)
Hemoglobin: 8.3 g/dL — ABNORMAL LOW (ref 12.0–15.0)
MCH: 33.6 pg (ref 26.0–34.0)
MCHC: 32.4 g/dL (ref 30.0–36.0)
MCV: 103.6 fL — ABNORMAL HIGH (ref 80.0–100.0)
Platelets: 131 10*3/uL — ABNORMAL LOW (ref 150–400)
RBC: 2.47 MIL/uL — ABNORMAL LOW (ref 3.87–5.11)
RDW: 20.1 % — ABNORMAL HIGH (ref 11.5–15.5)
WBC: 5.7 10*3/uL (ref 4.0–10.5)
nRBC: 0.4 % — ABNORMAL HIGH (ref 0.0–0.2)

## 2020-11-19 LAB — MAGNESIUM: Magnesium: 2.2 mg/dL (ref 1.7–2.4)

## 2020-11-19 MED ORDER — MIDODRINE HCL 10 MG PO TABS: 10.0000 mg | ORAL_TABLET | Freq: Every day | ORAL | 0 refills | Status: DC

## 2020-11-19 MED ORDER — PANTOPRAZOLE SODIUM 40 MG PO TBEC: 40.0000 mg | DELAYED_RELEASE_TABLET | Freq: Every day | ORAL | 0 refills | Status: DC

## 2020-11-19 MED ORDER — AMIODARONE HCL 200 MG PO TABS: 200.0000 mg | ORAL_TABLET | Freq: Two times a day (BID) | ORAL | 0 refills | Status: DC

## 2020-11-19 MED ORDER — PROSOURCE PLUS PO LIQD: 30.0000 mL | Freq: Two times a day (BID) | ORAL | Status: DC

## 2020-11-19 NOTE — Progress Notes (Signed)
Progress Note  Patient Name: Melissa Adams Date of Encounter: 11/19/2020  Primary Cardiologist: [name], MD  Subjective   She is very tearful as she had to spend Christmas here, she wishes [place] home, denies CP or SOB.   Inpatient Medications    Scheduled Meds: . (feeding supplement) PROSource Plus  30 mL Oral BID BM  . sodium chloride   Intravenous Once  . amiodarone  200 mg Oral BID  . Chlorhexidine Gluconate Cloth  6 each Topical Q0600  . cinacalcet  30 mg Oral Q supper  . diclofenac Sodium  2 g Topical QID  . midodrine  10 mg Oral Daily  . montelukast  10 mg Oral Daily  . multivitamin  1 tablet Oral QHS  . pantoprazole  40 mg Oral Q0600  . sodium chloride flush  3 mL Intravenous Q12H   Continuous Infusions: . sodium chloride    . sodium chloride    . sodium chloride    . sodium chloride 100 mL (11/16/20 1442)  . sodium chloride    . cefTRIAXone (ROCEPHIN)  IV 1 g (11/19/20 0829)   PRN Meds: sodium chloride, sodium chloride, sodium chloride, acetaminophen **OR** acetaminophen, albuterol, lidocaine (PF), lidocaine-prilocaine, pentafluoroprop-tetrafluoroeth, sodium chloride flush   Vital Signs    Vitals:   11/18/20 1945 11/18/20 2334 11/19/20 0358 11/19/20 0811  BP: (!) 89/51 (!) 81/45 (!) 82/46 (!) 82/66  Pulse: 62 70 71 100  Resp: 20 15 20 20   Temp: 98.4 F (36.9 C) 98.2 F (36.8 C) 98.3 F (36.8 C) 97.8 F (36.6 C)  TempSrc: Oral Oral Oral Oral  SpO2: 94% 94% 92% 100%  Weight:   93.4 kg     Intake/Output Summary (Last 24 hours) at 11/19/2020 0914 Last data filed at 11/18/2020 2231 Gross per 24 hour  Intake 270 ml  Output --  Net 270 ml   Filed Weights   11/17/20 0749 11/17/20 1214 11/19/20 0358  Weight: 93.6 kg 92.1 kg 93.4 kg    Physical Exam   GEN: Well nourished, well developed, in no acute distress. Tearful HEENT: Grossly normal.  Neck: Supple, no JVD, carotid bruits, or masses. Cardiac: IR, IR, 2/6 holosystolic murmur  throughout.  No rubs, or gallops. No clubbing, cyanosis, 2+ bilat LE edema to knees.  Radials 2+, DP/PT 2+ and equal bilaterally.  Respiratory:  Respirations regular and unlabored, markedly diminished breath sounds @ right base- 1/3 way up, otw CTA. GI: Soft, nontender, nondistended, BS + x 4. MS: no deformity or atrophy. Skin: warm and dry, no rash. Neuro:  Strength and sensation are intact. Psych: AAOx3.  Normal affect.  Labs    Chemistry Recent Labs  Lab 11/16/20 0127 11/17/20 0155 11/19/20 0306  NA 140 134* 133*  K 3.7 3.9 4.2  CL 100 98 97*  CO2 22 22 24   GLUCOSE 81 109* 121*  BUN 7* 14 22  CREATININE 2.24* 3.83* 5.12*  CALCIUM 8.0* 7.6* 7.2*  PROT 6.3* 5.5* 5.3*  ALBUMIN 3.7 3.5 3.3*  AST 56* 46* 49*  ALT 40 39 49*  ALKPHOS 116 101 111  BILITOT 1.5* 1.3* 1.1  GFRNONAA 22* 12* 8*  ANIONGAP 18* 14 12     Hematology Recent Labs  Lab 11/17/20 0155 11/18/20 0102 11/19/20 0306  WBC 7.3 6.8 5.7  RBC 2.73* 2.61* 2.47*  HGB 8.8* 8.6* 8.3*  HCT 29.4* 27.2* 25.6*  MCV 107.7* 104.2* 103.6*  MCH 32.2 33.0 33.6  MCHC 29.9* 31.6 32.4  RDW  20.9* 20.4* 20.1*  PLT 130* 131* 131*    Lipids  Lab Results  Component Value Date   CHOL 163 10/24/2020   HDL 60 10/24/2020   LDLCALC 93 10/24/2020   TRIG 51 10/24/2020   CHOLHDL 2.7 10/24/2020    HbA1c  Lab Results  Component Value Date   HGBA1C 5.2 06/22/2020    Radiology    CT ABDOMEN PELVIS W CONTRAST  Result Date: 11/17/2020 CLINICAL DATA:  GI bleed.  Duodenal carcinoid. EXAM: CT ABDOMEN AND PELVIS WITH CONTRAST TECHNIQUE: Multidetector CT imaging of the abdomen and pelvis was performed using the standard protocol following bolus administration of intravenous contrast. CONTRAST:  149mL OMNIPAQUE IOHEXOL 300 MG/ML  SOLN COMPARISON:  None FINDINGS: Lower chest: There is a large, partially visualized right-sided pleural effusion.There appears to be complete collapse of the right lower lobe. There is significant  cardiomegaly. Hepatobiliary: The liver is normal. Cholelithiasis without acute inflammation.There is no biliary ductal dilation. Pancreas: Normal contours without ductal dilatation. No peripancreatic fluid collection. Spleen: Unremarkable. Adrenals/Urinary Tract: --Adrenal glands: There is a left adrenal nodule measuring approximately 1.8 cm (axial series 3, image 22). --Right kidney/ureter: No hydronephrosis or radiopaque kidney stones. --Left kidney/ureter: No hydronephrosis or radiopaque kidney stones. --Urinary bladder: Unremarkable. Stomach/Bowel: --Stomach/Duodenum: No hiatal hernia or other gastric abnormality. Normal duodenal course and caliber. --Small bowel: Unremarkable. --Colon: Unremarkable. --Appendix: Normal. Vascular/Lymphatic: Atherosclerotic calcification is present within the non-aneurysmal abdominal aorta, without hemodynamically significant stenosis. --No retroperitoneal lymphadenopathy. --No mesenteric lymphadenopathy. --No pelvic or inguinal lymphadenopathy. Reproductive: Status post hysterectomy. No adnexal mass. Other: There is a moderate volume of free fluid the patient's pelvis. There is a small volume of free fluid in the upper abdomen. The abdominal wall is normal. Musculoskeletal. No acute displaced fractures. IMPRESSION: 1. No acute abdominopelvic abnormality. 2. Large, partially visualized right-sided pleural effusion with complete collapse of the right lower lobe. 3. Cholelithiasis without acute inflammation. 4. Indeterminate left adrenal nodule measuring approximately 1.8 cm. Given the patient's history, a follow-up outpatient adrenal protocol CT is recommended. Aortic Atherosclerosis (ICD10-I70.0). Electronically Signed   By: Constance Holster M.D.   On: 11/17/2020 22:49   DG CHEST PORT 1 VIEW  Result Date: 11/15/2020 CLINICAL DATA:  Pneumonia.  Follow-up. EXAM: PORTABLE CHEST 1 VIEW COMPARISON:  11/13/2020 FINDINGS: Cardiomegaly as seen previously. Right effusion with volume  loss in the right lower lung, similar to the previous exam. Left lung is clear. IMPRESSION: No change. Right effusion with volume loss in the right lower lung. Cardiomegaly. Electronically Signed   By: Faiz Weber Chimes M.D.   On: 11/15/2020 08:11   Telemetry    Afib, pvc's, 90's to 130's  Cardiac Studies   TTE 04/20/2020  1. Left ventricular ejection fraction, by estimation, is 30 to 35%. The  left ventricle has moderate to severely decreased function. The left  ventricle demonstrates global hypokinesis. The left ventricular internal  cavity size was mildly dilated. Left  ventricular diastolic parameters are indeterminate.   2. Right ventricular systolic function is mildly reduced. The right  ventricular size is moderately enlarged. There is mildly elevated  pulmonary artery systolic pressure. D-shaped interventricular septum  consistent with RV pressure/volume overload. PA  systolic pressure 43 mmHg.   3. Left atrial size was moderately dilated.   4. Right atrial size was moderately dilated.   5. The mitral valve is normal in structure. Moderate mitral valve  regurgitation. No evidence of mitral stenosis.   6. Tricuspid valve regurgitation is moderate.   7. The  aortic valve is tricuspid. Aortic valve regurgitation is not  visualized. Mild aortic valve sclerosis is present, with no evidence of  aortic valve stenosis.   8. The inferior vena cava is dilated in size with <50% respiratory  variability, suggesting right atrial pressure of 15 mmHg.    Left heart cath [facility] 07/2019 -> normal coronary arteries reported   Patient Profile     Melissa Adams is a 74 y.o. female with systolic heart failure (EF 30-35%), reported severe mitral regurgitation and severe tricuspid regurgitation(discrepancy on echo in May which showed moderate disease), A. 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   Assessment & Plan    1.  Acute blood loss anemia/hypovolemic shock: Status post transfusions this admission with stable H&H of 8.3 and 25.6 today.  Underwent evaluation with colonoscopy and EGD- both unrevealing.  She thereafter had capsule endoscopy indicative of suspected small bowel bleeding with patchy inflammatory changes/erosions in the mid small bowel.  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  Without melena or hematochezia overnight. Per GI: Moving forward, she is at risk for rebleeding with resumption of Eliquis or other blood thinner. In this acute setting with recent significant bleeding I would hold it for at least a week and see how she does.  We will arrange for a follow up in the clinic in 10-14 days, depending on Hb we will decide about restarting Eliquis at that time.   2.  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  Denies dyspnea.  Diminished breath sounds at right base in the setting of pleural effusion but otherwise clear to auscultation.  She does have 2+ lower extremity edema on examination this morning.  Management per nephrology/hemodialysis. She is advised to wear compression socks, next HD is scheduled for tomorrow.  3.  Permanent AFib:  Prev rate controlled on toprol xl 37.5mg  BID, but ongoing hypotension has prevented [place] from using, thus she is currently on amio 200 bid for the purposes of rate control only, I would continue the same dose.  Rates mostly trending in the 90-100.  With history of cirrhosis, will have to follow LFTs closely on amiodarone therapy.  Hopefully this is a short-term solution.  Eliquis on hold in the setting of #1.  4.  Chronic hypotension:  On midodrine but prev tolerated toprol xl @ home.  Pressures cont to trend in the 70's, thus  blocker on hold.  5.  Right pleural  effusion: Status post thoracentesis in September.  Chest x-ray December 22 showed volume loss in the right lower lung.  She moves very little air on that side.  Question if she might benefit from repeat thoracentesis while she is off of Eliquis, as dyspnea will contribute to elevated heart rates.  6.  ESRD:  HD per nephrology.  Signed, Ena Dawley, MD  11/19/2020, 9:14 AM

## 2020-11-19 NOTE — Progress Notes (Signed)
Patient ID: Melissa Adams, female   DOB: Feb 07, 1946, 74 y.o.   MRN: [id number]  [facility] Progress Note   Assessment/ Plan:   1.  Acute blood loss anemia with history of melena: Underwent evaluation with colonoscopy and EGD earlier today for unrevealing.  She thereafter had capsule endoscopy indicative of suspected small bowel bleeding with patchy inflammatory changes/erosions in the mid small bowel.  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  Without melena or hematochezia overnight. 2. ESRD: On hemodialysis on a Monday/Wednesday/Friday schedule and underwent uneventful dialysis on 12/24.  Anticipated discharge home later today to continue outpatient dialysis tomorrow without heparin.  She will need a fistulogram in the next 1 to 2 weeks to evaluate for pulsatile inflow. 3.  Paroxysmal atrial fibrillation: On chronic anticoagulation with Eliquis-currently on hold secondary to GI bleed with KCentra administration for reversal.  Continue heparin free dialysis at this time. 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 5. Nutrition: Continue renal diet with fluid restriction 6. Hypotension: Relative hypotension noted but patient asymptomatic; midodrine 10 mg daily.  Subjective:   Denies any chest pain or shortness of breath overnight.  Reports some symptomatic improvement following thoracentesis.  No melena or hematochezia overnight.   Objective:   BP (!) 82/66 (BP Location: Right Arm)   Pulse 100   Temp 97.8 F (36.6 C) (Oral)   Resp 20   Wt 93.4 kg   SpO2 100%   BMI 36.46 kg/m   Physical Exam: Gen: Comfortably sitting up in recliner CVS: Pulse irregular, S1 and S2 normal Resp: Clear to auscultation bilaterally, no distinct rales or rhonchi Abd: Soft, flat, nontender and bowel sounds are normal Ext: Trace lower extremity edema.  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  Labs: BMET Recent Labs  Lab 11/13/20 1318 11/14/20 0328 11/15/20 0122 11/16/20 0127 11/17/20 0155 11/19/20 0306  NA 138 135 136 140 134* 133*  K 3.5 4.1 3.4* 3.7 3.9 4.2  CL 92* 91* 96* 100 98 97*  CO2 30 29 27 22 22 24   GLUCOSE 104* 103* 122* 81 109* 121*  BUN 33* 46* 15 7* 14 22  CREATININE 3.22* 4.17* 2.54* 2.24* 3.83* 5.12*  CALCIUM 7.7* 7.6* 7.7* 8.0* 7.6* 7.2*  PHOS  --  3.6  --   --   --   --    CBC Recent Labs  Lab 11/14/20 0328 11/14/20 0905 11/15/20 0122 11/16/20 0127 11/17/20 0155 11/18/20 0102 11/19/20 0306  WBC 9.1   < > 6.1 7.9 7.3 6.8 5.7  NEUTROABS 6.6  --  4.0 5.6 5.2  --   --   HGB 7.3*   < > 7.9* 9.5* 8.8* 8.6* 8.3*  HCT 21.5*   < > 24.7* 29.0* 29.4* 27.2* 25.6*  MCV 98.2   < > 100.4* 103.2* 107.7* 104.2* 103.6*  PLT 146*   < > 128* 144* 130* 131* 131*   < > = values in this interval not displayed.     Medications:    . (feeding supplement) PROSource Plus  30 mL Oral BID BM  . sodium chloride   Intravenous Once  . amiodarone  200 mg Oral BID  . Chlorhexidine Gluconate Cloth  6 each Topical Q0600  . cinacalcet  30 mg Oral Q supper  . diclofenac Sodium  2 g Topical QID  . midodrine  10 mg  Oral Daily  . montelukast  10 mg Oral Daily  . multivitamin  1 tablet Oral QHS  . pantoprazole  40 mg Oral Q0600  . sodium chloride flush  3 mL Intravenous Q12H   Elmarie Shiley, MD 11/19/2020, 9:07 AM

## 2020-11-19 NOTE — Progress Notes (Signed)
Mobility Specialist: Progress Note   11/19/20 1205  Mobility  Activity  (Cancel)   Pt sleeping upon entering room and has had trouble getting rest. Pt discharging soon per RN.   [facility] Trong Gosling Mobility Specialist

## 2020-11-19 NOTE — Discharge Summary (Signed)
Physician Discharge Summary  Melissa Adams:[id number] DOB: 1946/11/13 DOA: 11/13/2020  PCP: [name], NP  Admit date: 11/13/2020 Discharge date: 11/19/2020  Admitted From: Home Disposition: Home  Recommendations for Outpatient Follow-up:  1. Follow-up with PCP in 1 week 2. Repeat CBC on follow-up visit 3. Follow-up with GI and cardiology as a scheduled. 4. Continue to hold Eliquis until follow-up with cardiology 5. Compression stockings for leg swelling 6. Midodrine 10 mg daily 7. 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 fib on Eliquis as an outpatient  Follows with Dr. Bryan Lemma in the outpatient setting-also gets recurrent Thoracentesis for recurrent effusions on the right side by Dr. Silas Flood  Return to the emergency room 12/20 dark tarry stools being lightheaded fatigue epigastric discomfort Found to have hemoglobin 5.7 patient was hypotensive initially-given 1 unit of blood blood pressure above 100. Given Kcentra Protonix octreotide Dr. Havery Moros consulted for likely upper GI bleed  Acute blood loss anemia/hypovolemic shock: -Unclear etiology at this time.  Either small bowel bleed or colonic bleed.  Hemoglobin 5.7 upon admission. -Status post 2 unit PRBC transfusion.  -Received Kcentra to reverse anticoagulation with Eliquis -H&H remained stable. reviewed upper GI endoscopy-no blood in the lumen or cause of bleeding noted.  Incidentally noted benign appearing duodenal nodule.   -Patient underwent  colonoscopy on 12/23-showed polyps in cecum, ascending colon, transverse colon-resected.  Congested mucosa right ileocecal valve-biopsied, old blood clot noted exiting ileum into the cecum. 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 -CT abdomen/pelvis: Negative for any mass.  No plans for endoscopic removal of carcinoid at this time.  GI signed off.  Follow-up with GI outpatient  Right-sided pleural effusion: -Status post thoracentesis on 12/25.  Patient tolerated procedure very well. -Maintain oxygen saturation on room air  Chronic A. Fib: -Rate control medicine on hold due to hypotension. -Continue with amiodarone 200 mg twice daily in the setting of hypotension-switched to metoprolol once her blood pressure is back to baseline.  Appreciate cardiology's assistance. -Patient will be discharged on amiodarone 200 mg twice daily as per cardiology recommendations.  Continue to hold Eliquis until seen by cardiology outpatient.  ESRD on hemodialysis on MWF -Appreciate nephrology help -Patient will be discharged on midodrine 10 mg daily.  Liver cirrhosis: -Thought to be in the setting of congestive cirrhosis.    On Rocephin for SBP prophylaxis during hospitalization. -On 2 L of oxygen as needed  Acute on chronic systolic CHF/ischemic cardiomyopathy: -Reviewed recent echo.  Ejection fraction of 30 to 35% -Hemodialysis for fluid management.  Continue to hold beta-blocker in the setting of hypotension.  No ACE/ARB in the setting of ESRD and hypertension. -Strict INO's and daily weight.  Monitor electrolytes closely. -Compression stockings for leg swelling.  Follow-up with cardiology outpatient.  Chronic hypotension: -Patient will be discharged on midodrine 10 mg daily as per nephrology's recommendations.  Hypokalemia: Resolved  Thrombocytopenia: Platelet  130.  Morbid obesity with BMI of 35: -Diet modification/exercise and weight loss recommended.  Discharge Diagnoses:  Acute blood loss anemia/hypovolemic shock  Right-sided pleural effusion Chronic A. fib ESRD on hemodialysis on Monday Wednesday Friday Liver cirrhosis Acute on chronic systolic CHF/ischemic cardiomyopathy Chronic hypertension Hypokalemia Thrombocytopenia Morbid obesity with BMI of 35  Discharge Instructions  Discharge Instructions    Consult for Unassigned Medical Admission   Complete by: As directed    Diet - low sodium heart healthy   Complete by: As directed    Discharge instructions   Complete by: As directed    Follow-up with PCP in 1 week Repeat CBC on follow-up visit Follow-up with GI and cardiology as a scheduled. Continue to hold Eliquis until follow-up with cardiology Compression stockings for leg swelling Midodrine 10 mg daily Amiodarone 200 mg twice daily   Increase activity slowly   Complete by: As directed    No wound care   Complete by: As directed      Allergies as of 11/19/2020      Reactions   Black Wal[employer] Flavor Hives   Shellfish Allergy Itching   Makes throat itch       Medication List    STOP taking these medications   apixaban 5 MG Tabs tablet Commonly known as: Eliquis     TAKE these medications   (feeding supplement) PROSource Plus liquid Take 30 mLs by mouth 2 (two) times daily between meals.   acetaminophen 325 MG tablet Commonly known as: TYLENOL Take 2 tablets (650 mg total) by mouth every 4 (four) hours as needed for headache or mild pain.   albuterol 108 (90 Base) MCG/ACT inhaler Commonly known as: VENTOLIN HFA Inhale 2 puffs into the lungs every 6 (six) hours as needed for wheezing or shortness of breath.   allopurinol 300 MG tablet Commonly known as: ZYLOPRIM Take 1 tablet (300 mg total) by mouth at bedtime.   amiodarone 200 MG tablet Commonly known as: PACERONE Take 1 tablet (200 mg total) by mouth  2 (two) times daily.   ascorbic acid 500 MG tablet Commonly known as: VITAMIN C Take 1 tablet (500 mg total) by mouth daily.   atorvastatin 80 MG tablet Commonly known as: LIPITOR Take 1 tablet (80 mg total) by mouth at bedtime.   cetirizine 10 MG tablet Commonly known as: ZYRTEC Take 10 mg by mouth daily as needed for allergies.   cinacalcet 30 MG tablet Commonly known as: SENSIPAR Take 30 mg by mouth daily with supper.   ferric citrate 1 GM 210 MG(Fe) tablet Commonly known as: AURYXIA Take 210 mg by mouth 3 (three) times daily with meals.   furosemide 40 MG tablet Commonly known as: LASIX Take 80 mg by mouth See admin instructions. Take 2 tablets (80 mg) by mouth twice daily on non-dialysis days - Sunday, Tuesday, Thursday   lidocaine-prilocaine cream Commonly known as: EMLA Apply 1 application topically See admin instructions. Apply topically to port access one hour prior to dialysis - Monday, Wednesday, Friday, Saturday   midodrine 10 MG tablet Commonly known as: PROAMATINE Take 1 tablet (10 mg total) by mouth daily. Start taking on: November 20, 2020 What changed:   when to take this  additional instructions   montelukast 10 MG tablet Commonly known as: SINGULAIR Take 1 tablet by mouth once daily   multivitamin Tabs tablet Take 1 tablet by mouth daily.   pantoprazole 40 MG tablet Commonly known as: PROTONIX Take 1 tablet (40 mg total) by mouth daily at 6 (six) AM. Start taking on: November 20, 2020   vitamin B-12 1000 MCG tablet Commonly known as: CYANOCOBALAMIN  Take 1,000 mcg by mouth daily.       Allergies  Allergen Reactions  . [employer]  . Shellfish Allergy Itching    Makes throat itch     Consultations:  GI  Cardiology  Nephrology   Procedures/Studies: DG Chest 1 View  Result Date: 11/18/2020 CLINICAL DATA:  Status post right thoracentesis EXAM: CHEST  1 VIEW COMPARISON:  12/17/2019, CT from 11/18/2019 FINDINGS:  Cardiac shadow remains enlarged in size. Right-sided pleural effusion has reduced significantly following thoracentesis. No pneumothorax is noted. No bony abnormality is seen. IMPRESSION: No evidence of pneumothorax following thoracentesis. Electronically Signed   By: Inez Catalina M.D.   On: 11/18/2020 12:00   CT ABDOMEN PELVIS W CONTRAST  Result Date: 11/17/2020 CLINICAL DATA:  GI bleed.  Duodenal carcinoid. EXAM: CT ABDOMEN AND PELVIS WITH CONTRAST TECHNIQUE: Multidetector CT imaging of the abdomen and pelvis was performed using the standard protocol following bolus administration of intravenous contrast. CONTRAST:  153mL OMNIPAQUE IOHEXOL 300 MG/ML  SOLN COMPARISON:  None FINDINGS: Lower chest: There is a large, partially visualized right-sided pleural effusion.There appears to be complete collapse of the right lower lobe. There is significant cardiomegaly. Hepatobiliary: The liver is normal. Cholelithiasis without acute inflammation.There is no biliary ductal dilation. Pancreas: Normal contours without ductal dilatation. No peripancreatic fluid collection. Spleen: Unremarkable. Adrenals/Urinary Tract: --Adrenal glands: There is a left adrenal nodule measuring approximately 1.8 cm (axial series 3, image 22). --Right kidney/ureter: No hydronephrosis or radiopaque kidney stones. --Left kidney/ureter: No hydronephrosis or radiopaque kidney stones. --Urinary bladder: Unremarkable. Stomach/Bowel: --Stomach/Duodenum: No hiatal hernia or other gastric abnormality. Normal duodenal course and caliber. --Small bowel: Unremarkable. --Colon: Unremarkable. --Appendix: Normal. Vascular/Lymphatic: Atherosclerotic calcification is present within the non-aneurysmal abdominal aorta, without hemodynamically significant stenosis. --No retroperitoneal lymphadenopathy. --No mesenteric lymphadenopathy. --No pelvic or inguinal lymphadenopathy. Reproductive: Status post hysterectomy. No adnexal mass. Other: There is a moderate  volume of free fluid the patient's pelvis. There is a small volume of free fluid in the upper abdomen. The abdominal wall is normal. Musculoskeletal. No acute displaced fractures. IMPRESSION: 1. No acute abdominopelvic abnormality. 2. Large, partially visualized right-sided pleural effusion with complete collapse of the right lower lobe. 3. Cholelithiasis without acute inflammation. 4. Indeterminate left adrenal nodule measuring approximately 1.8 cm. Given the patient's history, a follow-up outpatient adrenal protocol CT is recommended. Aortic Atherosclerosis (ICD10-I70.0). Electronically Signed   By: Constance Holster M.D.   On: 11/17/2020 22:49   DG CHEST PORT 1 VIEW  Result Date: 11/15/2020 CLINICAL DATA:  Pneumonia.  Follow-up. EXAM: PORTABLE CHEST 1 VIEW COMPARISON:  11/13/2020 FINDINGS: Cardiomegaly as seen previously. Right effusion with volume loss in the right lower lung, similar to the previous exam. Left lung is clear. IMPRESSION: No change. Right effusion with volume loss in the right lower lung. Cardiomegaly. Electronically Signed   By: Nelson Chimes M.D.   On: 11/15/2020 08:11   DG CHEST PORT 1 VIEW  Result Date: 11/13/2020 CLINICAL DATA:  Chest pain and shortness of breath. EXAM: PORTABLE CHEST 1 VIEW COMPARISON:  Chest x-ray 11/07/2020 FINDINGS: The heart size and mediastinal contours are unchanged with cardiomegaly. Aortic calcifications. No focal consolidation. No pulmonary edema. Interval development of an at least small right pleural effusion. No left pleural effusion. No pneumothorax. No acute osseous abnormality. IMPRESSION: 1. Small right pleural effusion. 2. Marked cardiomegaly. Electronically Signed   By: Iven Finn M.D.   On: 11/13/2020 21:23   DG Chest Port 1V same Day  Result Date:  11/07/2020 CLINICAL DATA:  Status post right thoracentesis EXAM: PORTABLE CHEST 1 VIEW COMPARISON:  10/12/2020 FINDINGS: Cardiac shadow is enlarged but stable. The lungs are well aerated  bilaterally. No pneumothorax is noted following right-sided thoracentesis. Previously seen effusion has resolved. Aortic atherosclerotic calcifications are seen. No bony abnormality is noted. IMPRESSION: Status post right thoracentesis without evidence of pneumothorax. Electronically Signed   By: Inez Catalina M.D.   On: 11/07/2020 15:05   ECHOCARDIOGRAM LIMITED  Result Date: 11/17/2020    ECHOCARDIOGRAM LIMITED REPORT   Patient Name:   Melissa Adams Date of Exam: 11/17/2020 Medical Rec #:  [id number]         Height:       63.0 in Accession #:    [id number]        Weight:       206.3 lb Date of Birth:  [birth year]        BSA:          1.960 m Patient Age:    46 years          BP:           92/69 mmHg Patient Gender: F                 HR:           87 bpm. Exam Location:  Inpatient Procedure: Limited Echo, Cardiac Doppler and Color Doppler                                 MODIFIED REPORT:   This report was modified by Skeet Latch MD on 11/17/2020 due to update                                  aortic valve.  Indications:     Tricuspid valve disease.  History:         Patient has prior history of Echocardiogram examinations, most                  recent 04/20/2020. Arrythmias:Atrial Fibrillation; Risk                  Factors:Sleep Apnea. ESRD.  Sonographer:     Clayton Lefort RDCS (AE) Referring Phys:  [id number] [place] Diagnosing Phys: Skeet Latch MD IMPRESSIONS  1. Left ventricular ejection fraction, by estimation, is 35 to 40%. The left ventricle has moderately decreased function. The left ventricle demonstrates global hypokinesis. There is mild concentric left ventricular hypertrophy. Left ventricular diastolic parameters are indeterminate.  2. Right ventricular systolic function is normal. The right ventricular size is normal. There is moderately elevated pulmonary artery systolic pressure.  3. The mitral valve is normal in structure. Mild mitral valve regurgitation. No evidence of mitral  stenosis.  4. Tricuspid valve regurgitation is moderate to severe.  5. There is restriction of the right and noncoronary cusps. Visually there appears to be at least moderate aortic stenosis. LVOT VTI was not recorded, so cannot calculate a dimensionless index. Cannot exclude low-flow, low gradient aortic stenosis. The aortic valve is calcified. There is moderate calcification of the aortic valve. There is moderate thickening of the aortic valve. Aortic valve regurgitation is not visualized. Mild to moderate aortic valve sclerosis/calcification is present, without any evidence of aortic stenosis. Aortic valve mean gradient measures 9.0 mmHg. Aortic valve Vmax measures 2.13 m/s.  6. The inferior vena cava is dilated in size with <50% respiratory variability, suggesting right atrial pressure of 15 mmHg. FINDINGS  Left Ventricle: Left ventricular ejection fraction, by estimation, is 35 to 40%. The left ventricle has moderately decreased function. The left ventricle demonstrates global hypokinesis. The left ventricular internal cavity size was normal in size. There is mild concentric left ventricular hypertrophy. Left ventricular diastolic parameters are indeterminate. Right Ventricle: The right ventricular size is normal. No increase in right ventricular wall thickness. Right ventricular systolic function is normal. There is moderately elevated pulmonary artery systolic pressure. The tricuspid regurgitant velocity is 3.05 m/s, and with an assumed right atrial pressure of 15 mmHg, the estimated right ventricular systolic pressure is 03.0 mmHg. Left Atrium: Left atrial size was normal in size. Right Atrium: Right atrial size was normal in size. Pericardium: There is no evidence of pericardial effusion. Mitral Valve: The mitral valve is normal in structure. Mild mitral annular calcification. Mild mitral valve regurgitation. No evidence of mitral valve stenosis. Tricuspid Valve: The tricuspid valve is normal in structure.  Tricuspid valve regurgitation is moderate to severe. No evidence of tricuspid stenosis. Aortic Valve: There is restriction of the right and noncoronary cusps. Visually there appears to be at least moderate aortic stenosis. LVOT VTI was not recorded, so cannot calculate a dimensionless index. Cannot exclude low-flow, low gradient aortic stenosis. The aortic valve is calcified. There is moderate calcification of the aortic valve. There is moderate thickening of the aortic valve. Aortic valve regurgitation is not visualized. Mild to moderate aortic valve sclerosis/calcification is present, without any evidence of aortic stenosis. Aortic valve mean gradient measures 9.0 mmHg. Aortic valve peak gradient measures 18.2 mmHg. Pulmonic Valve: The pulmonic valve was normal in structure. Pulmonic valve regurgitation is trivial. No evidence of pulmonic stenosis. Aorta: The aortic root is normal in size and structure. Venous: The inferior vena cava is dilated in size with less than 50% respiratory variability, suggesting right atrial pressure of 15 mmHg. IAS/Shunts: No atrial level shunt detected by color flow Doppler. LEFT VENTRICLE PLAX 2D LVIDd:         4.70 cm LVIDs:         3.60 cm LV PW:         1.30 cm LV IVS:        1.20 cm LVOT diam:     2.00 cm LVOT Area:     3.14 cm  IVC IVC diam: 2.70 cm LEFT ATRIUM         Index LA diam:    5.30 cm 2.70 cm/m  AORTIC VALVE AV Vmax:      213.30 cm/s AV VTI:       0.409 m AV Peak Grad: 18.2 mmHg AV Mean Grad: 9.0 mmHg  AORTA Ao Root diam: 3.20 cm Ao Asc diam:  3.40 cm TRICUSPID VALVE TR Peak grad:   37.2 mmHg TR Vmax:        305.00 cm/s  SHUNTS Systemic Diam: 2.00 cm Skeet Latch MD Electronically signed by Skeet Latch MD Signature Date/Time: 11/17/2020/2:35:11 PM    Final (Updated)    US THORACENTESIS ASP PLEURAL SPACE W/IMG GUIDE  Result Date: 11/18/2020 INDICATION: End-stage renal disease on hemodialysis, congestive heart failure, and right pleural effusion. Request  for diagnostic and therapeutic thoracentesis. EXAM: ULTRASOUND GUIDED RIGHT THORACENTESIS MEDICATIONS: 1% lidocaine 8 mL COMPLICATIONS: None immediate. PROCEDURE: An ultrasound guided thoracentesis was thoroughly discussed with the patient and questions answered. The benefits, risks, alternatives and complications were also  discussed. The patient understands and wishes to proceed with the procedure. Written consent was obtained. Ultrasound was performed to localize and mark an adequate pocket of fluid in the right chest. The area was then prepped and draped in the normal sterile fashion. 1% Lidocaine was used for local anesthesia. Under ultrasound guidance a 6 Fr Safe-T-Centesis catheter was introduced. Thoracentesis was performed. The catheter was removed and a dressing applied. FINDINGS: A total of approximately 1.3 L of hazy orange fluid was removed. Samples were sent to the laboratory as requested by the clinical team. IMPRESSION: Successful ultrasound guided right thoracentesis yielding 1.3 L of pleural fluid. No pneumothorax on post-procedure chest x-ray. Read by: Gareth Eagle, PA-C Electronically Signed   By: Markus Daft M.D.   On: 11/18/2020 12:00       Subjective: Patient seen and examined.  Ready to go home today.  She continues to have bilateral leg swelling however denies shortness of breath, lightheadedness, dizziness, orthopnea, PND, chest pain, palpitation, nausea or vomiting.    Discharge Exam: Vitals:   11/19/20 0358 11/19/20 0811  BP: (!) 82/46 (!) 82/66  Pulse: 71 100  Resp: 20 20  Temp: 98.3 F (36.8 C) 97.8 F (36.6 C)  SpO2: 92% 100%   Vitals:   11/18/20 1945 11/18/20 2334 11/19/20 0358 11/19/20 0811  BP: (!) 89/51 (!) 81/45 (!) 82/46 (!) 82/66  Pulse: 62 70 71 100  Resp: 20 15 20 20   Temp: 98.4 F (36.9 C) 98.2 F (36.8 C) 98.3 F (36.8 C) 97.8 F (36.6 C)  TempSrc: Oral Oral Oral Oral  SpO2: 94% 94% 92% 100%  Weight:   93.4 kg     General: Pt is alert, awake,  not in acute distress, on room air, communicating well Cardiovascular: RRR, S1/S2 +, no rubs, no gallops Respiratory: CTA bilaterally, no wheezing, no rhonchi Abdominal: Soft, NT, ND, bowel sounds + Extremities: Bilateral 2+ pitting edema positive, no cyanosis    The results of significant diagnostics from this hospitalization (including imaging, microbiology, ancillary and laboratory) are listed below for reference.     Microbiology: Recent Results (from the past 240 hour(s))  Resp Panel by RT-PCR (Flu A&B, Covid) Nasopharyngeal Swab     Status: None   Collection Time: 11/13/20  8:26 PM   Specimen: Nasopharyngeal Swab; Nasopharyngeal(NP) swabs in vial transport medium  Result Value Ref Range Status   SARS Coronavirus 2 by RT PCR NEGATIVE NEGATIVE Final    Comment: (NOTE) SARS-CoV-2 target nucleic acids are NOT DETECTED.  The SARS-CoV-2 RNA is generally detectable in upper respiratory specimens during the acute phase of infection. The lowest concentration of SARS-CoV-2 viral copies this assay can detect is 138 copies/mL. A negative result does not preclude SARS-Cov-2 infection and should not be used as the sole basis for treatment or other patient management decisions. A negative result may occur with  improper specimen collection/handling, submission of specimen other than nasopharyngeal swab, presence of viral mutation(s) within the areas targeted by this assay, and inadequate number of viral copies(<138 copies/mL). A negative result must be combined with clinical observations, patient history, and epidemiological information. The expected result is Negative.  Fact Sheet for Patients:  EntrepreneurPulse.com.au  Fact Sheet for Healthcare Providers:  IncredibleEmployment.be  This test is no t yet approved or cleared by the [place] FDA and  has been authorized for detection and/or diagnosis of SARS-CoV-2 by FDA under an Emergency Use  Authorization (EUA). This EUA will remain  in effect (meaning this test  can be used) for the duration of the COVID-19 declaration under Section 564(b)(1) of the Act, 21 U.S.C.section 360bbb-3(b)(1), unless the authorization is terminated  or revoked sooner.       Influenza A by PCR NEGATIVE NEGATIVE Final   Influenza B by PCR NEGATIVE NEGATIVE Final    Comment: (NOTE) The Xpert Xpress SARS-CoV-2/FLU/RSV plus assay is intended as an aid in the diagnosis of influenza from Nasopharyngeal swab specimens and should not be used as a sole basis for treatment. Nasal washings and aspirates are unacceptable for Xpert Xpress SARS-CoV-2/FLU/RSV testing.  Fact Sheet for Patients: EntrepreneurPulse.com.au  Fact Sheet for Healthcare Providers: IncredibleEmployment.be  This test is not yet approved or cleared by the [place] FDA and has been authorized for detection and/or diagnosis of SARS-CoV-2 by FDA under an Emergency Use Authorization (EUA). This EUA will remain in effect (meaning this test can be used) for the duration of the COVID-19 declaration under Section 564(b)(1) of the Act, 21 U.S.C. section 360bbb-3(b)(1), unless the authorization is terminated or revoked.  Performed at [facility], [place] [address], [place], [place] [address]   Gram stain     Status: None   Collection Time: 11/18/20 11:38 AM   Specimen: PATH Cytology Pleural fluid  Result Value Ref Range Status   Specimen Description PLEURAL  Final   Special Requests NONE  Final   Gram Stain   Final    FEW WBC PRESENT,BOTH PMN AND MONONUCLEAR NO ORGANISMS SEEN Performed at [facility], [address], [place], [place] [address]    Report Status 11/18/2020 FINAL  Final     Labs: BNP (last 3 results) Recent Labs    05/05/20 1109 06/10/20 1638  BNP 3,410.0* 1,497.0*   Basic Metabolic Panel: Recent Labs  Lab 11/14/20 0328 11/15/20 0122 11/16/20 0127  11/17/20 0155 11/18/20 0102 11/19/20 0306  NA 135 136 140 134*  --  133*  K 4.1 3.4* 3.7 3.9  --  4.2  CL 91* 96* 100 98  --  97*  CO2 29 27 22 22   --  24  GLUCOSE 103* 122* 81 109*  --  121*  BUN 46* 15 7* 14  --  22  CREATININE 4.17* 2.54* 2.24* 3.83*  --  5.12*  CALCIUM 7.6* 7.7* 8.0* 7.6*  --  7.2*  MG 2.0  --  2.0 1.8 2.0 2.2  PHOS 3.6  --   --   --   --   --    Liver Function Tests: Recent Labs  Lab 11/14/20 0328 11/15/20 0122 11/16/20 0127 11/17/20 0155 11/19/20 0306  AST 35 36 56* 46* 49*  ALT 29 26 40 39 49*  ALKPHOS 123 100 116 101 111  BILITOT 1.9* 1.4* 1.5* 1.3* 1.1  PROT 5.5* 5.3* 6.3* 5.5* 5.3*  ALBUMIN 3.0* 3.2* 3.7 3.5 3.3*   No results for input(s): LIPASE, AMYLASE in the last 168 hours. No results for input(s): AMMONIA in the last 168 hours. CBC: Recent Labs  Lab 11/14/20 0328 11/14/20 0905 11/15/20 0122 11/16/20 0127 11/17/20 0155 11/18/20 0102 11/19/20 0306  WBC 9.1   < > 6.1 7.9 7.3 6.8 5.7  NEUTROABS 6.6  --  4.0 5.6 5.2  --   --   HGB 7.3*   < > 7.9* 9.5* 8.8* 8.6* 8.3*  HCT 21.5*   < > 24.7* 29.0* 29.4* 27.2* 25.6*  MCV 98.2   < > 100.4* 103.2* 107.7* 104.2* 103.6*  PLT 146*   < >  128* 144* 130* 131* 131*   < > = values in this interval not displayed.   Cardiac Enzymes: No results for input(s): CKTOTAL, CKMB, CKMBINDEX, TROPONINI in the last 168 hours. BNP: Invalid input(s): POCBNP CBG: No results for input(s): GLUCAP in the last 168 hours. D-Dimer No results for input(s): DDIMER in the last 72 hours. Hgb A1c No results for input(s): HGBA1C in the last 72 hours. Lipid Profile No results for input(s): CHOL, HDL, LDLCALC, TRIG, CHOLHDL, LDLDIRECT in the last 72 hours. Thyroid function studies No results for input(s): TSH, T4TOTAL, T3FREE, THYROIDAB in the last 72 hours.  Invalid input(s): FREET3 Anemia work up No results for input(s): VITAMINB12, FOLATE, FERRITIN, TIBC, IRON, RETICCTPCT in the last 72 hours. Urinalysis     Component Value Date/Time   COLORURINE YELLOW 10/12/2019 1723   APPEARANCEUR HAZY (A) 10/12/2019 1723   LABSPEC 1.009 10/12/2019 1723   PHURINE 5.0 10/12/2019 1723   GLUCOSEU NEGATIVE 10/12/2019 1723   HGBUR SMALL (A) 10/12/2019 1723   BILIRUBINUR NEGATIVE 10/12/2019 1723   KETONESUR NEGATIVE 10/12/2019 1723   PROTEINUR NEGATIVE 10/12/2019 1723   UROBILINOGEN 0.2 06/09/2019 1735   NITRITE NEGATIVE 10/12/2019 1723   LEUKOCYTESUR NEGATIVE 10/12/2019 1723   Sepsis Labs Invalid input(s): PROCALCITONIN,  WBC,  LACTICIDVEN Microbiology Recent Results (from the past 240 hour(s))  Resp Panel by RT-PCR (Flu A&B, Covid) Nasopharyngeal Swab     Status: None   Collection Time: 11/13/20  8:26 PM   Specimen: Nasopharyngeal Swab; Nasopharyngeal(NP) swabs in vial transport medium  Result Value Ref Range Status   SARS Coronavirus 2 by RT PCR NEGATIVE NEGATIVE Final    Comment: (NOTE) SARS-CoV-2 target nucleic acids are NOT DETECTED.  The SARS-CoV-2 RNA is generally detectable in upper respiratory specimens during the acute phase of infection. The lowest concentration of SARS-CoV-2 viral copies this assay can detect is 138 copies/mL. A negative result does not preclude SARS-Cov-2 infection and should not be used as the sole basis for treatment or other patient management decisions. A negative result may occur with  improper specimen collection/handling, submission of specimen other than nasopharyngeal swab, presence of viral mutation(s) within the areas targeted by this assay, and inadequate number of viral copies(<138 copies/mL). A negative result must be combined with clinical observations, patient history, and epidemiological information. The expected result is Negative.  Fact Sheet for Patients:  EntrepreneurPulse.com.au  Fact Sheet for Healthcare Providers:  IncredibleEmployment.be  This test is no t yet approved or cleared by the [place] FDA  and  has been authorized for detection and/or diagnosis of SARS-CoV-2 by FDA under an Emergency Use Authorization (EUA). This EUA will remain  in effect (meaning this test can be used) for the duration of the COVID-19 declaration under Section 564(b)(1) of the Act, 21 U.S.C.section 360bbb-3(b)(1), unless the authorization is terminated  or revoked sooner.       Influenza A by PCR NEGATIVE NEGATIVE Final   Influenza B by PCR NEGATIVE NEGATIVE Final    Comment: (NOTE) The Xpert Xpress SARS-CoV-2/FLU/RSV plus assay is intended as an aid in the diagnosis of influenza from Nasopharyngeal swab specimens and should not be used as a sole basis for treatment. Nasal washings and aspirates are unacceptable for Xpert Xpress SARS-CoV-2/FLU/RSV testing.  Fact Sheet for Patients: EntrepreneurPulse.com.au  Fact Sheet for Healthcare Providers: IncredibleEmployment.be  This test is not yet approved or cleared by the [place] FDA and has been authorized for detection and/or diagnosis of SARS-CoV-2 by FDA under an Emergency Use  Authorization (EUA). This EUA will remain in effect (meaning this test can be used) for the duration of the COVID-19 declaration under Section 564(b)(1) of the Act, 21 U.S.C. section 360bbb-3(b)(1), unless the authorization is terminated or revoked.  Performed at [facility], [place] [address], [place], [place] [address]   Gram stain     Status: None   Collection Time: 11/18/20 11:38 AM   Specimen: PATH Cytology Pleural fluid  Result Value Ref Range Status   Specimen Description PLEURAL  Final   Special Requests NONE  Final   Gram Stain   Final    FEW WBC PRESENT,BOTH PMN AND MONONUCLEAR NO ORGANISMS SEEN Performed at [facility], [address] [place], [place] [address]    Report Status 11/18/2020 FINAL  Final     Time coordinating discharge: Over 30 minutes  SIGNED:   Mckinley Jewel, MD  [facility] 11/19/2020, 10:43 AM Pager   If 7PM-7AM, please contact night-coverage [email or web address]

## 2020-11-19 NOTE — Progress Notes (Signed)
Pt discharged today to home with daughter.  Pt's IV removed.  Pt taken off telemetry and CCMD notified.  Pt left with all of her personal belongings with the exception of her cane.  Pt stated that cane was with her when she came to ED.  I called ED and dialysis, but neither department could find the cane.  Melissa Adams is not on [facility].  Pt given number for Patient Experience to call.  AVS documentation reviewed with Pt and all questions answered.

## 2020-11-20 ENCOUNTER — Telehealth (HOSPITAL_COMMUNITY): Payer: Self-pay | Admitting: Nephrology

## 2020-11-20 LAB — SURGICAL PATHOLOGY

## 2020-11-20 LAB — PH, BODY FLUID: pH, Body Fluid: 7.5

## 2020-11-20 NOTE — Telephone Encounter (Signed)
Transition of care contact from inpatient facility  Date of discharge: 11/19/20 Date of contact: 11/20/20 Method: Phone Spoke to: Patient's daughter  Patient contacted to discuss transition of care from recent inpatient hospitalization. Patient was admitted to [facility] from 12/20 - 11/19/20 with discharge diagnosis of ABLA with hypovolemic shock, then thoracentesis after aggressive volume resuscitation.   Medication changes were reviewed. Now on midodrine 10mg  TID.  Patient will follow up with his/her outpatient HD unit on: MWF sched - she went today, no issues reported.  Daughter will keep eye on patient's weight and BP at home. [name] reports that her blood (Hgb) was checked today at dialysis.  Melissa Penton, PA-C [employer] Pager [phone]

## 2020-11-21 ENCOUNTER — Telehealth: Payer: Self-pay

## 2020-11-21 ENCOUNTER — Other Ambulatory Visit: Payer: Self-pay | Admitting: Gastroenterology

## 2020-11-21 DIAGNOSIS — K922 Gastrointestinal hemorrhage, unspecified: Secondary | ICD-10-CM

## 2020-11-21 LAB — CYTOLOGY - NON PAP

## 2020-11-21 NOTE — Telephone Encounter (Signed)
Call placed to [place] # [phone] regarding patient's O2.  Spoke to Kennett and informed her of the difficulty patient is having with re-filling her portable O2 tanks and requested that someone go to patient's home to assist her.  Abigail Butts stated that she would put in a ticket to have someone contact patient to schedule a time to go out to her home to assess the concern.

## 2020-11-21 NOTE — Telephone Encounter (Signed)
Spoke to patient's daughter to schedule a follow up with Dr Bryan Lemma post hospital GI bleed. She will have labs done next week at the [facility] office and a follow up on 12/19/20 at 9 am. She knows to go to the [facility] office. All questions answered. The daughter voiced understanding.

## 2020-11-21 NOTE — Telephone Encounter (Signed)
Transition Care Management Follow-up Telephone Call  Call completed with patient's daughter, Melissa Adams.   Date of discharge and from where: 11/19/2020, [facility]   How have you been since you were released from the hospital? Melissa Adams said that her mother is doing " pretty good" but tired.  She said her mother has not experienced any bleeding.   Any questions or concerns? Yes -noted below regarding the O2 self fill system.   Items Reviewed:  Did the pt receive and understand the discharge instructions provided? Yes   Medications obtained and verified? Yes Melissa Adams said that she has all medications,  Some are bubble packed. Melissa Adams oversees the medication regime. Reviewed the use of amiodarone. She also stated that the eliquis is removed from the bubble pack.    Other? No   Any new allergies since your discharge? No   Dietary orders reviewed?  yes, no fluid restriction ordered. Melissa Adams said that her mother has not been on a fluid restriction. She does not have the ProSource feeding supplement yet.   Do you have support at home? Yes , her daughter and son in law  [place] and Equipment/Supplies: Were home health services ordered? no If so, what is the name of the agency? n/a Has the agency set up a time to come to the patient's home?  n/a Were any new equipment or medical supplies ordered?  No,nothing new ordered.  What is the name of the medical supply agency? n/a Were you able to get the supplies/equipment? n/a Do you have any questions related to the use of the equipment or supplies? No   She already has a rollator and O2 at home. Melissa Adams said that her mother uses the O2 mainly at night @ 2L.  She said that they have a room concentrator and self fill system. However, the self fill system is not working. Melissa Adams has called [facility] but has not had the issue resolved. Encouraged her to call again.  This CM will also call [employer] and request that the home system be evaluated.  Her  mother is able to use the room concentrator in multiple rooms in the home because she has long O2 tubing.   Her cane was lost when she was in the hospital.  Melissa Adams has been looking for a new one.   She attends HD @ [facility] M[facility]F.  She does not have compression stockings yet.  Melissa Adams said she will purchase some.    Functional Questionnaire: (I = Independent and D = Dependent) ADLs: independent, has rollator to use with ambulation.  Family provides needed assistance with ADLs.  Melissa Adams manages patient's medications.   Follow up appointments reviewed:   PCP Hospital f/u appt confirmed? Yes , [name] NP 11/23/2020.   [facility] f/u appt confirmed? Yes  - cardiology - 11/27/2020, GI 12/19/2020.; pulmonary - 01/02/2021.    Are transportation arrangements needed? No  - family provides transportation.   If their condition worsens, is the pt aware to call PCP or go to the Emergency Dept.?  Yes  Was the patient provided with contact information for the PCP's office or ED?   Provided her with phone number for [facility].  Was to pt encouraged to call back with questions or concerns? yes

## 2020-11-21 NOTE — Telephone Encounter (Signed)
-----   Message from Yetta Flock, MD sent at 11/18/2020  1:06 PM EST ----- Regarding: outpatient follow up [name] Billings,  This patient will need an outpatient follow up with Dr. Bryan Lemma within a month or so, or with PA. She should also have a CBC drawn under his name in 1 week or so to ensure stable. Thanks

## 2020-11-22 ENCOUNTER — Other Ambulatory Visit (INDEPENDENT_AMBULATORY_CARE_PROVIDER_SITE_OTHER): Payer: Self-pay | Admitting: Primary Care

## 2020-11-22 ENCOUNTER — Other Ambulatory Visit (HOSPITAL_COMMUNITY): Payer: Self-pay | Admitting: Cardiology

## 2020-11-22 NOTE — Telephone Encounter (Signed)
   Notes to clinic: Patient has appt tomorrow   Requested Prescriptions  Pending Prescriptions Disp Refills   allopurinol (ZYLOPRIM) 300 MG tablet [Pharmacy Med Name: Allopurinol 300MG  TABS] 30 tablet     Sig: TAKE 1 TABLET BY MOUTH AT BEDTIME      Endocrinology:  Gout Agents Failed - 11/22/2020  8:59 AM      Failed - Uric Acid in normal range and within 360 days    No results found for: POCURA, LABURIC        Failed - Cr in normal range and within 360 days    Creatinine, Ser  Date Value Ref Range Status  11/19/2020 5.12 (H) 0.44 - 1.00 mg/dL Final   Creatinine, Urine  Date Value Ref Range Status  10/12/2019 59.39 mg/dL Final          Passed - Valid encounter within last 12 months    Recent Outpatient Visits           5 months ago Need for Tdap vaccination   [place], NP   8 months ago Need for Tdap vaccination   [place], NP   11 months ago Encounter to establish care   [place], NP       Future Appointments             Tomorrow [name], NP [place]

## 2020-11-23 ENCOUNTER — Other Ambulatory Visit: Payer: Self-pay

## 2020-11-23 ENCOUNTER — Encounter (INDEPENDENT_AMBULATORY_CARE_PROVIDER_SITE_OTHER): Payer: Self-pay | Admitting: Primary Care

## 2020-11-23 ENCOUNTER — Telehealth (INDEPENDENT_AMBULATORY_CARE_PROVIDER_SITE_OTHER): Payer: Self-pay

## 2020-11-23 ENCOUNTER — Telehealth (INDEPENDENT_AMBULATORY_CARE_PROVIDER_SITE_OTHER): Payer: Medicare HMO | Admitting: Primary Care

## 2020-11-23 DIAGNOSIS — J9601 Acute respiratory failure with hypoxia: Secondary | ICD-10-CM

## 2020-11-23 DIAGNOSIS — Z09 Encounter for follow-up examination after completed treatment for conditions other than malignant neoplasm: Secondary | ICD-10-CM

## 2020-11-23 LAB — CULTURE, BODY FLUID W GRAM STAIN -BOTTLE: Culture: NO GROWTH

## 2020-11-23 NOTE — Progress Notes (Signed)
Telephone Note  I connected with Melissa Adams on 11/23/20 at  9:30 AM EST by telephone and verified that I am speaking with the correct person using two identifiers.  Location: Patient: Home Provider: [name] @home    I discussed the limitations, risks, security and privacy concerns of performing an evaluation and management service by telephone and the availability of in person appointments. I also discussed with the patient that there may be a patient responsible charge related to this service. The patient expressed understanding and agreed to proceed.   History of Present Illness Ms. Melissa Adams is a 74 y.o.female presents for follow up from the hospital. Admit date to the hospital was 11/13/20, patient was discharged from the hospital on 11/19/20, patient was admitted for: Upper GI bleeding ,Anemia, unspecified type , Pleural effusion and. PNA (pneumonia). She is feeling much better except back pain " feels like a monkey on her back" Denies shortness of breath, headaches, chest pain or lower extremity edema. No obvious bleed seen.  Past Medical History:  Diagnosis Date   A-fib ([place])    Allergies    Arthritis    Asthma    Cardiomyopathy ([place])    CHF (congestive heart failure) (HCC)    Chronic kidney disease    Edema    History of hemodialysis    Hyperlipidemia    Hypertension    Pulmonary hypertension (HCC)    Sleep apnea    Tricuspid regurgitation      Allergies  Allergen Reactions   Black Walnut Flavor Hives   Shellfish Allergy Itching    Makes throat itch       Current Outpatient Medications on File Prior to Visit  Medication Sig Dispense Refill   acetaminophen (TYLENOL) 325 MG tablet Take 2 tablets (650 mg total) by mouth every 4 (four) hours as needed for headache or mild pain.     albuterol (VENTOLIN HFA) 108 (90 Base) MCG/ACT inhaler Inhale 2 puffs into the lungs every 6 (six) hours as needed for wheezing or shortness of  breath. 6.7 g 1   allopurinol (ZYLOPRIM) 300 MG tablet Take 1 tablet (300 mg total) by mouth at bedtime. 90 tablet 1   amiodarone (PACERONE) 200 MG tablet Take 1 tablet (200 mg total) by mouth 2 (two) times daily. 60 tablet 0   atorvastatin (LIPITOR) 80 MG tablet Take 1 tablet (80 mg total) by mouth at bedtime. 30 tablet 5   cetirizine (ZYRTEC) 10 MG tablet Take 10 mg by mouth daily as needed for allergies.     cinacalcet (SENSIPAR) 30 MG tablet Take 30 mg by mouth daily with supper.      ELIQUIS 5 MG TABS tablet TAKE 1 TABLET BY MOUTH TWICE A DAY 60 tablet 1   ferric citrate (AURYXIA) 1 GM 210 MG(Fe) tablet Take 210 mg by mouth 3 (three) times daily with meals.     furosemide (LASIX) 40 MG tablet Take 80 mg by mouth See admin instructions. Take 2 tablets (80 mg) by mouth twice daily on non-dialysis days - Sunday, Tuesday, Thursday     lidocaine-prilocaine (EMLA) cream Apply 1 application topically See admin instructions. Apply topically to port access one hour prior to dialysis - Monday, Wednesday, Friday, Saturday     midodrine (PROAMATINE) 10 MG tablet Take 1 tablet (10 mg total) by mouth daily. 30 tablet 0   montelukast (SINGULAIR) 10 MG tablet Take 1 tablet by mouth once daily (Patient taking differently: Take 10 mg by mouth  daily.) 90 tablet 0   multivitamin (RENA-VIT) TABS tablet Take 1 tablet by mouth daily.     Nutritional Supplements (,FEEDING SUPPLEMENT, PROSOURCE PLUS) liquid Take 30 mLs by mouth 2 (two) times daily between meals.     pantoprazole (PROTONIX) 40 MG tablet Take 1 tablet (40 mg total) by mouth daily at 6 (six) AM. 30 tablet 0   vitamin B-12 (CYANOCOBALAMIN) 1000 MCG tablet Take 1,000 mcg by mouth daily.     vitamin C (VITAMIN C) 500 MG tablet Take 1 tablet (500 mg total) by mouth daily. 30 tablet 0   No current facility-administered medications on file prior to visit.    ROS: all negative except above.  Limited to tele visit  Observations/Objective:   [name] was seen today for hospitalization follow-up.  Diagnoses and all orders for this visit:  Hospital discharge follow-up Retrieved from hospital discharge note   Follow-Ups: Follow up with [name], MD (Cardiology) in 1 week (11/27/2020); our office will arrange and contact you PCP seen today.  Acute respiratory failure with hypoxia ([place]) See Transition of Care note order a portable O2 tank . Current one too cumbersome to take and carry to all up coming appointments  : [name], NP 9:23 AM         Assessment and Plan:   Follow Up Instructions:    I discussed the assessment and treatment plan with the patient. The patient was provided an opportunity to ask questions and all were answered. The patient agreed with the plan and demonstrated an understanding of the instructions.   The patient was advised to call back or seek an in-person evaluation if the symptoms worsen or if the condition fails to improve as anticipated.  I provided 22 minutes of non-face-to-face time during this encounter. Includes reviewing notes labs and procedures   [name], NP

## 2020-11-23 NOTE — Progress Notes (Signed)
Pt is followed by GI

## 2020-11-23 NOTE — Telephone Encounter (Signed)
Copied from [place] [id number]. Topic: General - Other >> Nov 23, 2020  9:21 AM Rainey Pines A wrote: Pts daughter stated that during patients hospital stay that patient was advised to get pro source plus and wanted to make sure pcp was aware for patients appt today   Please advise

## 2020-11-23 NOTE — Telephone Encounter (Signed)
Sent to PCP to make aware.

## 2020-11-24 ENCOUNTER — Other Ambulatory Visit (INDEPENDENT_AMBULATORY_CARE_PROVIDER_SITE_OTHER): Payer: Self-pay | Admitting: Primary Care

## 2020-11-24 MED ORDER — ALLOPURINOL 300 MG PO TABS: 300.0000 mg | ORAL_TABLET | Freq: Every day | ORAL | 1 refills | Status: DC

## 2020-11-27 ENCOUNTER — Other Ambulatory Visit: Payer: Self-pay

## 2020-11-27 ENCOUNTER — Telehealth (HOSPITAL_COMMUNITY): Payer: Self-pay

## 2020-11-27 ENCOUNTER — Other Ambulatory Visit (HOSPITAL_COMMUNITY): Payer: Self-pay

## 2020-11-27 ENCOUNTER — Ambulatory Visit (HOSPITAL_COMMUNITY)
Admission: RE | Admit: 2020-11-27 | Discharge: 2020-11-27 | Disposition: A | Discharge status: Home / Self Care | Payer: Medicare HMO | Source: Ambulatory Visit | Attending: Cardiology | Admitting: Cardiology

## 2020-11-27 ENCOUNTER — Encounter (HOSPITAL_COMMUNITY): Payer: Self-pay | Admitting: Cardiology

## 2020-11-27 ENCOUNTER — Other Ambulatory Visit (INDEPENDENT_AMBULATORY_CARE_PROVIDER_SITE_OTHER): Payer: Medicare HMO

## 2020-11-27 VITALS — BP 104/60 | HR 74 | Ht 63.0 in | Wt 200.0 lb

## 2020-11-27 DIAGNOSIS — I5082 Biventricular heart failure: Secondary | ICD-10-CM | POA: Diagnosis not present

## 2020-11-27 DIAGNOSIS — I5023 Acute on chronic systolic (congestive) heart failure: Secondary | ICD-10-CM | POA: Diagnosis not present

## 2020-11-27 DIAGNOSIS — Z79899 Other long term (current) drug therapy: Secondary | ICD-10-CM | POA: Insufficient documentation

## 2020-11-27 DIAGNOSIS — I081 Rheumatic disorders of both mitral and tricuspid valves: Secondary | ICD-10-CM | POA: Diagnosis not present

## 2020-11-27 DIAGNOSIS — I132 Hypertensive heart and chronic kidney disease with heart failure and with stage 5 chronic kidney disease, or end stage renal disease: Secondary | ICD-10-CM | POA: Diagnosis not present

## 2020-11-27 DIAGNOSIS — I428 Other cardiomyopathies: Secondary | ICD-10-CM | POA: Diagnosis not present

## 2020-11-27 DIAGNOSIS — Z87891 Personal history of nicotine dependence: Secondary | ICD-10-CM | POA: Insufficient documentation

## 2020-11-27 DIAGNOSIS — I5022 Chronic systolic (congestive) heart failure: Secondary | ICD-10-CM | POA: Diagnosis not present

## 2020-11-27 DIAGNOSIS — I482 Chronic atrial fibrillation, unspecified: Secondary | ICD-10-CM | POA: Diagnosis not present

## 2020-11-27 DIAGNOSIS — Z9119 Patient's noncompliance with other medical treatment and regimen: Secondary | ICD-10-CM | POA: Insufficient documentation

## 2020-11-27 DIAGNOSIS — K922 Gastrointestinal hemorrhage, unspecified: Secondary | ICD-10-CM

## 2020-11-27 DIAGNOSIS — Z8249 Family history of ischemic heart disease and other diseases of the circulatory system: Secondary | ICD-10-CM | POA: Insufficient documentation

## 2020-11-27 DIAGNOSIS — Z8616 Personal history of COVID-19: Secondary | ICD-10-CM | POA: Insufficient documentation

## 2020-11-27 DIAGNOSIS — E340 Carcinoid syndrome: Secondary | ICD-10-CM

## 2020-11-27 DIAGNOSIS — N186 End stage renal disease: Secondary | ICD-10-CM | POA: Diagnosis not present

## 2020-11-27 DIAGNOSIS — Z7901 Long term (current) use of anticoagulants: Secondary | ICD-10-CM | POA: Diagnosis not present

## 2020-11-27 DIAGNOSIS — G4733 Obstructive sleep apnea (adult) (pediatric): Secondary | ICD-10-CM | POA: Diagnosis not present

## 2020-11-27 DIAGNOSIS — Z992 Dependence on renal dialysis: Secondary | ICD-10-CM | POA: Insufficient documentation

## 2020-11-27 DIAGNOSIS — E3409 Other carcinoid syndrome: Secondary | ICD-10-CM

## 2020-11-27 LAB — CBC
HCT: 31.5 % — ABNORMAL LOW (ref 36.0–46.0)
HCT: 33.5 % — ABNORMAL LOW (ref 36.0–46.0)
Hemoglobin: 10.5 g/dL — ABNORMAL LOW (ref 12.0–15.0)
Hemoglobin: 10.9 g/dL — ABNORMAL LOW (ref 12.0–15.0)
MCH: 33.4 pg (ref 26.0–34.0)
MCHC: 33.3 g/dL (ref 30.0–36.0)
MCHC: 32.6 g/dL (ref 30.0–36.0)
MCV: 100.3 fL — ABNORMAL HIGH (ref 80.0–100.0)
MCV: 98.2 fl (ref 78.0–100.0)
Platelets: 150 10*3/uL (ref 150–400)
Platelets: 150 10*3/uL (ref 150.0–400.0)
RBC: 3.14 MIL/uL — ABNORMAL LOW (ref 3.87–5.11)
RBC: 3.41 Mil/uL — ABNORMAL LOW (ref 3.87–5.11)
RDW: 17.1 % — ABNORMAL HIGH (ref 11.5–15.5)
RDW: 17.6 % — ABNORMAL HIGH (ref 11.5–15.5)
WBC: 4.7 10*3/uL (ref 4.0–10.5)
WBC: 5.1 10*3/uL (ref 4.0–10.5)
nRBC: 0 % (ref 0.0–0.2)

## 2020-11-27 MED ORDER — METOPROLOL SUCCINATE ER 50 MG PO TB24: 25.0000 mg | ORAL_TABLET | Freq: Two times a day (BID) | ORAL | 3 refills | Status: DC

## 2020-11-27 MED ORDER — APIXABAN 5 MG PO TABS: 5.0000 mg | ORAL_TABLET | Freq: Two times a day (BID) | ORAL | 3 refills | Status: DC

## 2020-11-27 NOTE — Telephone Encounter (Signed)
Malena Edman, RN  11/27/2020 1:19 PM EST Back to Top     Patient's daughter advised and verbalized understanding. Med list updated. Lab appt scheduled at clinic today

## 2020-11-27 NOTE — Patient Instructions (Addendum)
STOP Amiodarone  START Toprol XL 25mg  (1/2 tablet) twice daily  Labs done today, your results will be available in MyChart, we will contact you for abnormal readings.  You have been referred to [facility]. They will contact you to schedule  Your physician recommends that you schedule repeat labs in 2 weeks  Your physician recommends that you schedule a follow-up appointment in: 3 months  If you have any questions or concerns before your next appointment please send [place] a message through [place] or call our office at [phone].    TO LEAVE A MESSAGE FOR THE NURSE SELECT OPTION 2, PLEASE LEAVE A MESSAGE INCLUDING: . YOUR NAME . DATE OF BIRTH . CALL BACK NUMBER . REASON FOR CALL**this is important as we prioritize the call backs  YOU WILL RECEIVE A CALL BACK THE SAME DAY AS LONG AS YOU CALL BEFORE 4:00 PM

## 2020-11-27 NOTE — Telephone Encounter (Signed)
-----   Message from [name], MD sent at 11/27/2020 12:53 PM EST ----- Restart Eliquis 5 mg bid.  Hgb is ok.  Needs CBC in 2 wks.

## 2020-11-27 NOTE — Progress Notes (Signed)
Cardiology: Dr. Aundra Dubin  Nephrology: Dr. Moshe Cipro  75 y.o. with history of chronic systolic HF, mitral valve regurgitation and tricuspid valve regurgitation was scheduled to undergo both mitral and tricuspid valve replacement surgery on 10/13/19 in [place], [place].  He had a prior cath in 9/20 with no significant coronary disease.  Also w/ h/o atrial fibrillation on chronic anticoagulation w/ Eliquis, OSA on CPAP, and HTN.  Echocardiogram on August 05, 2019 in [place] noted an EF of 40 to 45% with severe MR and TR.  She presented to the [facility] 10/11/19 with progressive SOB and wt gain and edema over the prior 2-3 weeks.  She had been staying with her daughter in [place].  She was found in the ER to have markedly elevated BNP and marked volume overload. Hs troponin mildly elevated w/ flat trend 30>>33>>27>>31. SCr 1.91 on admit. BUN 47. K 4.1. COVID-19 +.  D-dimer 12.41. started on IV heparin. V/Q scan showed no evidence of PE. She was in atrial fibrillation with RVR.  Echo was done, showing EF down to 20-25%. She was started on milrinone and diuresed. Despite this, creatinine steadily rose. She ultimately required CVVH.  Significant volume was removed.  Renal function did not recover and she was started on iHD.  TEE-guided DCCV was attempted but she did not stay in NSR.  Amiodarone was stopped as she did not maintain NSR. She was treated with remdesivir and Decadron for COVID-19.   Echo in 5/21 showed EF 30-35%, D-shaped septum with moderately dilated RV/mildly decreased systolic function, PASP 43, moderate MR, moderate TR.   She has had thoracenteses in 6/21, 7/21, and 9/21.  She was admitted with upper GI bleeding in 12/21.  Colonoscopy and EGD did not show source of bleeding.  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  She had capsule endoscopy showing erosion in small bowel with bleeding.  She was taken off Eliquis for 1 week. BP was low in the hospital, she was taken off  Toprol XL and started on amiodarone for rate control.  Echo in 12/21 showed EF 35-40%, global hypokinesis, normal RV, mod-severe TR, ?mild-moderate AS, mild MR.  She had thoracenteses x 2 in 12/21.   She returns for followup of CHF.  Volume managed by HD but takes Lasix on non-HD days (still makes a little urine).  No lightheadedness, SBP 100s.  No BRBPR/melena.  Mild dyspnea after walking 50 feet.  No orthopnea/PND.  No chest pain.      ECG (personally reviewed): Atrial fibrillation, PVCs rate 80s  PMH: 1. Gout 2. Chronic atrial fibrillation: She failed TEE-guided DCCV in 12/20.  Prior DCCVs attempted in [place].  3. OSA 4. COVID-19 infection: 11/20-12/20. Treated with remdesivir + dexamethasone.  5. AKI 11/20 with development of ESRD.  6. HTN 7. Chronic systolic CHF: 6/75 LHC in [place], no significant coronary disease.  - [facility] EF 40-45% with severe MR and TR.   - TEE (12/20): EF 20%, diffuse hypokinesis, moderately decreased RV systolic function, moderate-severe TR, peak RV-RA gradient 46 mmHg, moderate functional MR.  - Echo (5/21): EF 30-35%, D-shaped septum with moderately dilated RV/mildly decreased systolic function, PASP 43, moderate MR, moderate TR.  - Echo (12/21): EF 35-40%, global hypokinesis, normal RV, mod-severe TR, ?mild-moderate AS, mild MR.   8. Hyperlipidemia 9. Right pleural effusion: Recurrent, thoracenteses in 6/21, 7/21, 9/21, x 2 in 12/21.  10. Cirrhosis: Noted on abdominal US.  Never a heavy drinker, HCV and HBV negative.  11. Upper GI bleeding: 12/21.  Erosion noted in small bowel.  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   Social History   Socioeconomic History  . Marital status: Widowed    Spouse name: Not on file  . Number of children: Not on file  . Years of education: Not on file  . Highest education level: Not on file  Occupational History  . Not on file  Tobacco Use  . Smoking status: Former Smoker     Types: Cigarettes  . Smokeless tobacco: Never Used  Vaping Use  . Vaping Use: Never used  Substance and Sexual Activity  . Alcohol use: Not Currently  . Drug use: Not Currently  . Sexual activity: Not Currently  Other Topics Concern  . Not on file  Social History Narrative  . Not on file   Social Determinants of Health   Financial Resource Strain: Not on file  Food Insecurity: Not on file  Transportation Needs: No Transportation Needs  . Lack of Transportation (Medical): No  . Lack of Transportation (Non-Medical): No  Physical Activity: Not on file  Stress: Not on file  Social Connections: Socially Isolated  . Frequency of Communication with Friends and Family: More than three times a week  . Frequency of Social Gatherings with Friends and Family: Once a week  . Attends Religious Services: Never  . Active Member of Clubs or Organizations: No  . Attends Archivist Meetings: Never  . Marital Status: Widowed  Intimate Partner Violence: Not on file   Family History  Problem Relation Age of Onset  . Seizures Father   . CAD Father   . Diabetes Sister   . Lupus Sister   . Hypertension Sister   . Colon cancer Neg Hx   . Esophageal cancer Neg Hx   . Pancreatic cancer Neg Hx   . Stomach cancer Neg Hx    ROS: All systems reviewed and negative except as per HPI.   Current Outpatient Medications  Medication Sig Dispense Refill  . acetaminophen (TYLENOL) 325 MG tablet Take 2 tablets (650 mg total) by mouth every 4 (four) hours as needed for headache or mild pain.    Marland Kitchen albuterol (VENTOLIN HFA) 108 (90 Base) MCG/ACT inhaler Inhale 2 puffs into the lungs every 6 (six) hours as needed for wheezing or shortness of breath. 6.7 g 1  . allopurinol (ZYLOPRIM) 300 MG tablet TAKE 1 TABLET BY MOUTH AT BEDTIME 30 tablet 3  . atorvastatin (LIPITOR) 80 MG tablet Take 1 tablet (80 mg total) by mouth at bedtime. 30 tablet 5  . cetirizine (ZYRTEC) 10 MG tablet Take 10 mg by mouth  daily as needed for allergies.    . cinacalcet (SENSIPAR) 30 MG tablet Take 30 mg by mouth daily with supper.     . ferric citrate (AURYXIA) 1 GM 210 MG(Fe) tablet Take 210 mg by mouth 3 (three) times daily with meals.    . furosemide (LASIX) 40 MG tablet Take 80 mg by mouth See admin instructions. Take 2 tablets (80 mg) by mouth twice daily on non-dialysis days - Sunday, Tuesday, Thursday    . lidocaine-prilocaine (EMLA) cream Apply 1 application topically See admin instructions. Apply topically to port access one hour prior to dialysis - Monday, Wednesday, Friday, Saturday    . midodrine (PROAMATINE) 10 MG tablet Take 1 tablet (10 mg total) by mouth daily. 30 tablet 0  . montelukast (SINGULAIR) 10 MG tablet Take 1 tablet by  mouth once daily 90 tablet 0  . multivitamin (RENA-VIT) TABS tablet Take 1 tablet by mouth daily.    . Nutritional Supplements (,FEEDING SUPPLEMENT, PROSOURCE PLUS) liquid Take 30 mLs by mouth 2 (two) times daily between meals.    . pantoprazole (PROTONIX) 40 MG tablet Take 1 tablet (40 mg total) by mouth daily at 6 (six) AM. 30 tablet 0  . vitamin B-12 (CYANOCOBALAMIN) 1000 MCG tablet Take 1,000 mcg by mouth daily.    . vitamin C (VITAMIN C) 500 MG tablet Take 1 tablet (500 mg total) by mouth daily. 30 tablet 0  . apixaban (ELIQUIS) 5 MG TABS tablet Take 1 tablet (5 mg total) by mouth 2 (two) times daily. 60 tablet 3  . metoprolol succinate (TOPROL-XL) 50 MG 24 hr tablet Take 0.5 tablets (25 mg total) by mouth in the morning and at bedtime. Take with or immediately following a meal. 60 tablet 3   No current facility-administered medications for this encounter.   BP 104/60   Pulse 74   Ht 5\' 3"  (1.6 m)   Wt 90.7 kg (200 lb)   SpO2 97%   BMI 35.43 kg/m  General: NAD Neck: JVP 8 cm, no thyromegaly or thyroid nodule.  Lungs: Clear to auscultation bilaterally with normal respiratory effort. CV: Nondisplaced PMI.  Heart irregular S1/S2, no S3/S4, 2/6 HSM LLSB.  1+ edema  3/4 to knees bilaterally.  No carotid bruit.  Normal pedal pulses.  Abdomen: Soft, nontender, no hepatosplenomegaly, no distention.  Skin: Intact without lesions or rashes.  Neurologic: Alert and oriented x 3.  Psych: Normal affect. Extremities: No clubbing or cyanosis.  HEENT: Normal.   Assessment/Plan:  1. Acute on chronic systolic CHF:  Nonischemic cardiomyopathy, cath in [place] in 9/20 with no significant coronary disease.  Echo in 11/20 with EF 20-25% with dilated and severely dysfunctional RV with D-shaped interventricular septum. MR appeared moderate and TR appeared moderate. With dilated RV, V/Q scan was done and was negative. Biventricular failure. Last echo in [place] per report showed EF 40-45%. Cause of fall in EF uncertain, could be due to atrial fibrillation with RVR (tachycardia-mediated). Myocarditis, potentially from COVID-19, is possible.  She is now HD-dependent for volume control.  Most recent echo in 12/21 showed EF 35-40%.  She takes Lasix on non-HD days, still makes some urine.  Despite HD, she is still mildly volume overloaded on exam today, NYHA class III symptoms. - Would like her back on Toprol XL, stop amiodarone and start Toprol XL 25 mg bid.  - She needs more fluid off during her HD sessions, may need to go back to 4 days/week HD.  2. Valvular heart disease: Patient was supposed to have mitral and tricuspid valve replacements in 11/20 in [place].  TEE in 9/20 in [place] showed mod-severe MR and severe TR. TEE on 10/26/19 showed mod-severe TR and moderate MR. Echo in 5/21 showed moderate MR and moderate TR.  Echo in 12/21 showed moderate-severe TR and mild MR. Her main issue seems to be biventricular failure and would hold off on valve procedures for the time being.     3. Atrial fibrillation: Chronic per outpatient notes. Per daughter, she has had cardioversions in the past. She failed DCCV on amiodarone on 10/26/19. I think she is unlikely to go back into NSR at this  point.  She has been off Eliquis due GI bleeding.  - She has been getting amiodarone for rate control since last hospitalization.  Stop amiodarone, start Toprol XL  25 mg bid.  - Check CBC today, if hgb rising, will have her restart Eliquis 5 mg bid.  4. ESRD: Appears to be tolerating HD but needs more volume removal.  5. OSA: Patient has been diagnosed with OSA in the past but wants a nasal prongs CPAP (not using CPAP currently).    - had sleep study in 5/21 but has not yet started CPAP.  Will send a message to Dr. Radford Pax.  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   - Needs GI referral, Dr. Bryan Lemma.  Will make today.   Followup in 4 months.   [name] 11/27/2020

## 2020-11-28 ENCOUNTER — Other Ambulatory Visit (INDEPENDENT_AMBULATORY_CARE_PROVIDER_SITE_OTHER): Payer: Self-pay | Admitting: Primary Care

## 2020-11-29 ENCOUNTER — Telehealth: Payer: Self-pay | Admitting: General Surgery

## 2020-11-29 NOTE — Telephone Encounter (Signed)
Spoke with Melissa Adams the patients daughter regarding the patients CBC improving. Melissa Adams stated that her mother is having labs completed with Cardio prior to her visit with [place] and will have them sent over.

## 2020-11-29 NOTE — Telephone Encounter (Signed)
-----   Message from [place], DO sent at 11/28/2020  1:20 PM EST ----- CBC notable for improving hemoglobin/hematocrit, now 10.9/33.5, respectively from 8.3/25.6 on 11/19/2020 hospital discharge date following recent suspected small bowel bleed.  Plan to keep follow-up appointment with me later this month as already scheduled.

## 2020-12-06 ENCOUNTER — Telehealth: Payer: Self-pay | Admitting: *Deleted

## 2020-12-06 NOTE — Telephone Encounter (Signed)
-----   Message from Sueanne Margarita, MD sent at 12/05/2020  6:47 PM EST ----- Kirk Ruths since she does not want PAP I will just let you handle O2 since you follow her long term if that's ok  Traci ----- Message ----- From: Freada Bergeron, CMA Sent: 12/05/2020   5:23 PM EST To: [name], MD, Sueanne Margarita, MD  Patients daughter states her mother has declined the ENT referral and just wants oxygen. Thanks, Gae Bon ----- Message ----- From: Sueanne Margarita, MD Sent: 11/27/2020   9:45 PM EST To: [name], MD, Freada Bergeron, CMA  Gae Bon please find out if patient has been seen by ENT yet  Traci ----- Message ----- From: [name], MD Sent: 11/27/2020   4:05 PM EST To: Sueanne Margarita, MD  Got it.  Remember that now.  Think she has not followed up on the ENT  ----- Message ----- From: Sueanne Margarita, MD Sent: 11/27/2020   3:42 PM EST To: [name], MD  [place]  Patient could not complete study and daughter said she would not be able to use PAP so we referred to ENT with Dr. Redmond Baseman for possible hypoglossal nerve stimulator.  I had sent you a note a while back to see if you were ok with this and you said yes.  Gae Bon  Please find out if patient ever followed through with ENT referral.  You will need to speak with her daguther  Traci ----- Message ----- From: [name], MD Sent: 11/27/2020   3:37 PM EST To: Sueanne Margarita, MD  This patient has been waiting for CPAP.  Is she on schedule to get it soon?

## 2020-12-07 ENCOUNTER — Telehealth (HOSPITAL_COMMUNITY): Payer: Self-pay | Admitting: Vascular Surgery

## 2020-12-07 NOTE — Telephone Encounter (Signed)
Left pt message to call back to resch lab appt 12/11/20 to another day due to inclement weather

## 2020-12-11 ENCOUNTER — Other Ambulatory Visit (HOSPITAL_COMMUNITY): Payer: Medicare HMO

## 2020-12-14 ENCOUNTER — Other Ambulatory Visit: Payer: Self-pay

## 2020-12-14 ENCOUNTER — Ambulatory Visit (HOSPITAL_COMMUNITY)
Admission: RE | Admit: 2020-12-14 | Discharge: 2020-12-14 | Disposition: A | Discharge status: Home / Self Care | Payer: Medicare HMO | Source: Ambulatory Visit | Attending: Cardiology | Admitting: Cardiology

## 2020-12-14 DIAGNOSIS — I5022 Chronic systolic (congestive) heart failure: Secondary | ICD-10-CM

## 2020-12-14 LAB — CBC
HCT: 34.4 % — ABNORMAL LOW (ref 36.0–46.0)
Hemoglobin: 10.2 g/dL — ABNORMAL LOW (ref 12.0–15.0)
MCH: 30.6 pg (ref 26.0–34.0)
MCHC: 29.7 g/dL — ABNORMAL LOW (ref 30.0–36.0)
MCV: 103.3 fL — ABNORMAL HIGH (ref 80.0–100.0)
Platelets: 155 10*3/uL (ref 150–400)
RBC: 3.33 MIL/uL — ABNORMAL LOW (ref 3.87–5.11)
RDW: 17.9 % — ABNORMAL HIGH (ref 11.5–15.5)
WBC: 5.3 10*3/uL (ref 4.0–10.5)
nRBC: 0.4 % — ABNORMAL HIGH (ref 0.0–0.2)

## 2020-12-18 ENCOUNTER — Telehealth: Payer: Self-pay

## 2020-12-18 NOTE — Telephone Encounter (Signed)
Received referral from Dr. Moshe Cipro requesting fistulagram due to bleeding during dialysis and post tx on multiple occasions. To scheduling

## 2020-12-19 ENCOUNTER — Encounter (HOSPITAL_COMMUNITY): Payer: Medicare HMO | Admitting: Cardiology

## 2020-12-19 ENCOUNTER — Encounter: Payer: Self-pay | Admitting: Gastroenterology

## 2020-12-19 ENCOUNTER — Ambulatory Visit (INDEPENDENT_AMBULATORY_CARE_PROVIDER_SITE_OTHER): Payer: Medicare HMO | Admitting: Gastroenterology

## 2020-12-19 ENCOUNTER — Other Ambulatory Visit: Payer: Self-pay

## 2020-12-19 VITALS — BP 98/56 | HR 49 | Ht 63.0 in | Wt 203.4 lb

## 2020-12-19 DIAGNOSIS — J948 Other specified pleural conditions: Secondary | ICD-10-CM

## 2020-12-19 DIAGNOSIS — I509 Heart failure, unspecified: Secondary | ICD-10-CM | POA: Diagnosis not present

## 2020-12-19 DIAGNOSIS — E3409 Other carcinoid syndrome: Secondary | ICD-10-CM

## 2020-12-19 DIAGNOSIS — I4891 Unspecified atrial fibrillation: Secondary | ICD-10-CM

## 2020-12-19 DIAGNOSIS — N186 End stage renal disease: Secondary | ICD-10-CM

## 2020-12-19 DIAGNOSIS — E340 Carcinoid syndrome: Secondary | ICD-10-CM | POA: Diagnosis not present

## 2020-12-19 DIAGNOSIS — K7469 Other cirrhosis of liver: Secondary | ICD-10-CM

## 2020-12-19 DIAGNOSIS — Z992 Dependence on renal dialysis: Secondary | ICD-10-CM

## 2020-12-19 DIAGNOSIS — Z7901 Long term (current) use of anticoagulants: Secondary | ICD-10-CM

## 2020-12-19 NOTE — Patient Instructions (Signed)
If you are age 75 or older, your body mass index should be between 23-30. Your Body mass index is 36.03 kg/m. If this is out of the aforementioned range listed, please consider follow up with your Primary Care Provider.  If you are age 2 or younger, your body mass index should be between 19-25. Your Body mass index is 36.03 kg/m. If this is out of the aformentioned range listed, please consider follow up with your Primary Care Provider.   You are scheduled with Dr. Loreen Freud, please keep this appointment.   Follow up in 6 months.   It was a pleasure to see you today!  Vito Cirigliano, D.O.

## 2020-12-19 NOTE — Progress Notes (Signed)
P  Chief Complaint:    Hospital follow-up  GI History: 75 year old female with a history of arthritis, asthma, chronic hypotension, HLD, cardiomyopathy, CHF with EF 30-35%, mitral and tricuspid valve regurgitation, A. fib (on Eliquis), pulmonary hypertension, CKD/ESRD on HD, OSA, thrombocytopenia.  Admitted to [facility] in 05/2020 with recurrent right pleural effusion and generalized abdominal pain along with cirrhosis and upper abdominal ascites, suspected secondary to cardiomyopathy/cardiogenic cirrhosis. -Abdominal ultrasound (7/21): Nodular hepatic contour consistent with cirrhosis, trace ascites, GB stones/sludge, right pleural effusion -Thoracentesis: 04/2020, 05/2020, 07/2020, 10/2020 -08/2020: Establish care in GI clinic for cirrhosis.  Declines MRI/MRCP due to anxiety/claustrophobia.  Extended serologic work-up negative/normal.  Hepatitis a and B-; needs vaccine series  Hospitalized 12/20-26, 2021 with recurrent right pleural effusion, melena, acute blood loss anemia (Hgb 5.7 from baseline 11.7) with hypovolemic shock with extensive evaluation as below: -Transfuse 2 unit PRBCs -Received Kcentra to reverse Eliquis anticoagulation -EGD 12/21: No active bleeding, benign-appearing duodenal nodule (carcinoid) -Colonoscopy 12/22: Polyps x6 (tubular adenomas) in the cecum, ascending colon, transverse colon.  Congested mucosa at IC valve, old blood clots noted exiting ileum into the cecum.  No active bleeding -Video capsule endoscopy 12/23: Duodenal nodule (carcinoid), mid small bowel erosions with mild inflammatory changes with one red spot but no edema or active bleeding.  Prep limited in ileum -CT abdomen/pelvis 12/24: Large right pleural effusion, no significant abdominal pathology.  Interestingly, liver reported to be normal  -Right-sided thoracentesis -Treated with amiodarone for rate control -TTE with EF 30-35% -Discharged with midodrine for chronic hypotension  HPI:      Patient is a 75 y.o. female presenting [facility] for hospital follow-up.  She states that she is doing much better. No more melena. Good PO intake. Does HD MWF. Breathing also much improved.  Offers no complaints today.  Review of systems:     No chest pain, no SOB, no fevers, no urinary sx   Past Medical History:  Diagnosis Date  . A-fib ([place])   . Allergies   . Arthritis   . Asthma   . Cardiomyopathy ([place])   . CHF (congestive heart failure) ([place])   . Chronic kidney disease   . Edema   . History of hemodialysis   . Hyperlipidemia   . Hypertension   . Pulmonary hypertension ([place])   . Sleep apnea   . Tricuspid regurgitation     Patient's surgical history, family medical history, social history, medications and allergies were all reviewed in Epic    Current Outpatient Medications  Medication Sig Dispense Refill  . acetaminophen (TYLENOL) 325 MG tablet Take 2 tablets (650 mg total) by mouth every 4 (four) hours as needed for headache or mild pain.    Marland Kitchen albuterol (VENTOLIN HFA) 108 (90 Base) MCG/ACT inhaler Inhale 2 puffs into the lungs every 6 (six) hours as needed for wheezing or shortness of breath. 6.7 g 1  . allopurinol (ZYLOPRIM) 300 MG tablet TAKE 1 TABLET BY MOUTH AT BEDTIME 30 tablet 3  . apixaban (ELIQUIS) 5 MG TABS tablet Take 1 tablet (5 mg total) by mouth 2 (two) times daily. 60 tablet 3  . atorvastatin (LIPITOR) 80 MG tablet Take 1 tablet (80 mg total) by mouth at bedtime. 30 tablet 5  . cetirizine (ZYRTEC) 10 MG tablet Take 10 mg by mouth daily as needed for allergies.    . cinacalcet (SENSIPAR) 30 MG tablet Take 30 mg by mouth daily with supper.     . ferric  citrate (AURYXIA) 1 GM 210 MG(Fe) tablet Take 210 mg by mouth 3 (three) times daily with meals.    . furosemide (LASIX) 40 MG tablet Take 80 mg by mouth See admin instructions. Take 2 tablets (80 mg) by mouth twice daily on non-dialysis days - Sunday, Tuesday, Thursday    .  lidocaine-prilocaine (EMLA) cream Apply 1 application topically See admin instructions. Apply topically to port access one hour prior to dialysis - Monday, Wednesday, Friday, Saturday    . metoprolol succinate (TOPROL-XL) 50 MG 24 hr tablet Take 0.5 tablets (25 mg total) by mouth in the morning and at bedtime. Take with or immediately following a meal. 60 tablet 3  . midodrine (PROAMATINE) 10 MG tablet Take 1 tablet (10 mg total) by mouth daily. 30 tablet 0  . montelukast (SINGULAIR) 10 MG tablet Take 1 tablet by mouth once daily 90 tablet 0  . multivitamin (RENA-VIT) TABS tablet Take 1 tablet by mouth daily.    . Nutritional Supplements (,FEEDING SUPPLEMENT, PROSOURCE PLUS) liquid Take 30 mLs by mouth 2 (two) times daily between meals.    . pantoprazole (PROTONIX) 40 MG tablet Take 1 tablet (40 mg total) by mouth daily at 6 (six) AM. 30 tablet 0  . vitamin B-12 (CYANOCOBALAMIN) 1000 MCG tablet Take 1,000 mcg by mouth daily.    . vitamin C (VITAMIN C) 500 MG tablet Take 1 tablet (500 mg total) by mouth daily. 30 tablet 0   No current facility-administered medications for this visit.    Physical Exam:     BP (!) 98/56   Pulse (!) 49   Ht 5\' 3"  (1.6 m)   Wt 203 lb 6 oz (92.3 kg)   BMI 36.03 kg/m   GENERAL:  Pleasant female in NAD, sitting in wheelchair PSYCH: : Cooperative, normal affect Musculoskeletal:  Normal muscle tone, normal strength NEURO: Alert and oriented x 3, no focal neurologic deficits   IMPRESSION and PLAN:    1) Cardiogenic cirrhosis 2) Hepatic hydrothorax  -Continue diuretics -Continue follow-up in the Pulmonary clinic -Repeat imaging for HCC screening in 6 months -Repeat labs for hepatic synthetic function in 3-6 months after recovery from recent hospitalization  3) Duodenal Carcinoid -Referral to Dr. Rush Landmark for EUS with possible EMR  4) Small bowel AVMs 5) ESRD 6) CHF 7) A. fib 8) Systemic anticoagulation -Discussed pathophysiology and risk of  recurrent bleeding, particular in the setting of systemic anticoagulation.  Discussed device assisted enteroscopy as a means of treatment, but not prevention of future AVMs from forming.  Also discussed medical management with octreotide, hormone therapy, etc which she declines due to medication ADR profile.  She ultimately decided on conservative management as below, and given medical history, seems a reasonable decision -Continue regular CBC and iron checks with hemodialysis with IV iron and/or blood products as needed  RTC in 6 months or sooner as needed  I spent 30 minutes of time, including in depth chart review, independent review of results as outlined above, communicating results with the patient directly, face-to-face time with the patient, coordinating care, and ordering studies and medications as appropriate, and documentation.             [place] ,DO, FACG 12/19/2020, 9:03 AM

## 2020-12-20 ENCOUNTER — Other Ambulatory Visit (HOSPITAL_COMMUNITY)
Admission: RE | Admit: 2020-12-20 | Discharge: 2020-12-20 | Disposition: A | Discharge status: Home / Self Care | Payer: Medicare HMO | Source: Ambulatory Visit | Attending: Vascular Surgery | Admitting: Vascular Surgery

## 2020-12-20 DIAGNOSIS — Z20822 Contact with and (suspected) exposure to covid-19: Secondary | ICD-10-CM | POA: Diagnosis not present

## 2020-12-20 DIAGNOSIS — Z01812 Encounter for preprocedural laboratory examination: Secondary | ICD-10-CM | POA: Diagnosis present

## 2020-12-20 LAB — SARS CORONAVIRUS 2 (TAT 6-24 HRS): SARS Coronavirus 2: NEGATIVE

## 2020-12-21 ENCOUNTER — Other Ambulatory Visit: Payer: Self-pay

## 2020-12-21 ENCOUNTER — Encounter (HOSPITAL_COMMUNITY): Payer: Self-pay | Admitting: Vascular Surgery

## 2020-12-21 ENCOUNTER — Ambulatory Visit (HOSPITAL_COMMUNITY)
Admission: RE | Admit: 2020-12-21 | Discharge: 2020-12-21 | Disposition: A | Discharge status: Home / Self Care | Payer: Medicare HMO | Attending: Vascular Surgery | Admitting: Vascular Surgery

## 2020-12-21 ENCOUNTER — Encounter (HOSPITAL_COMMUNITY)
Admission: RE | Disposition: A | Discharge status: Home / Self Care | Payer: Self-pay | Source: Home / Self Care | Attending: Vascular Surgery

## 2020-12-21 DIAGNOSIS — Z87891 Personal history of nicotine dependence: Secondary | ICD-10-CM | POA: Insufficient documentation

## 2020-12-21 DIAGNOSIS — Z91013 Allergy to seafood: Secondary | ICD-10-CM | POA: Diagnosis not present

## 2020-12-21 DIAGNOSIS — I120 Hypertensive chronic kidney disease with stage 5 chronic kidney disease or end stage renal disease: Secondary | ICD-10-CM | POA: Insufficient documentation

## 2020-12-21 DIAGNOSIS — Y841 Kidney dialysis as the cause of abnormal reaction of the patient, or of later complication, without mention of misadventure at the time of the procedure: Secondary | ICD-10-CM | POA: Diagnosis not present

## 2020-12-21 DIAGNOSIS — N186 End stage renal disease: Secondary | ICD-10-CM | POA: Diagnosis not present

## 2020-12-21 DIAGNOSIS — Z79899 Other long term (current) drug therapy: Secondary | ICD-10-CM | POA: Diagnosis not present

## 2020-12-21 DIAGNOSIS — Z992 Dependence on renal dialysis: Secondary | ICD-10-CM | POA: Insufficient documentation

## 2020-12-21 DIAGNOSIS — Z7901 Long term (current) use of anticoagulants: Secondary | ICD-10-CM | POA: Diagnosis not present

## 2020-12-21 DIAGNOSIS — T82510A Breakdown (mechanical) of surgically created arteriovenous fistula, initial encounter: Secondary | ICD-10-CM | POA: Insufficient documentation

## 2020-12-21 DIAGNOSIS — T82898A Other specified complication of vascular prosthetic devices, implants and grafts, initial encounter: Secondary | ICD-10-CM | POA: Diagnosis not present

## 2020-12-21 HISTORY — PX: A/V FISTULAGRAM: CATH118298

## 2020-12-21 LAB — POCT I-STAT, CHEM 8
BUN: 29 mg/dL — ABNORMAL HIGH (ref 8–23)
Calcium, Ion: 0.74 mmol/L — CL (ref 1.15–1.40)
Chloride: 97 mmol/L — ABNORMAL LOW (ref 98–111)
Creatinine, Ser: 5.6 mg/dL — ABNORMAL HIGH (ref 0.44–1.00)
Glucose, Bld: 92 mg/dL (ref 70–99)
HCT: 36 % (ref 36.0–46.0)
Hemoglobin: 12.2 g/dL (ref 12.0–15.0)
Potassium: 5.3 mmol/L — ABNORMAL HIGH (ref 3.5–5.1)
Sodium: 136 mmol/L (ref 135–145)
TCO2: 33 mmol/L — ABNORMAL HIGH (ref 22–32)

## 2020-12-21 SURGERY — A/V FISTULAGRAM
Anesthesia: LOCAL

## 2020-12-21 MED ORDER — IODIXANOL 320 MG/ML IV SOLN
INTRAVENOUS | Status: DC | PRN
  Administered 2020-12-21: 20 mL

## 2020-12-21 MED ORDER — SODIUM CHLORIDE 0.9% FLUSH: 3.0000 mL | INTRAVENOUS | Status: DC | PRN

## 2020-12-21 MED ORDER — SODIUM CHLORIDE 0.9% FLUSH: 3.0000 mL | Freq: Two times a day (BID) | INTRAVENOUS | Status: DC

## 2020-12-21 MED ORDER — SODIUM CHLORIDE 0.9 % IV SOLN: 250.0000 mL | INTRAVENOUS | Status: DC | PRN

## 2020-12-21 MED ORDER — LIDOCAINE HCL (PF) 1 % IJ SOLN
INTRAMUSCULAR | Status: AC
  Filled 2020-12-21: qty 30

## 2020-12-21 MED ORDER — HEPARIN (PORCINE) IN NACL 1000-0.9 UT/500ML-% IV SOLN
INTRAVENOUS | Status: DC | PRN
  Administered 2020-12-21: 500 mL

## 2020-12-21 MED ORDER — HEPARIN (PORCINE) IN NACL 1000-0.9 UT/500ML-% IV SOLN
INTRAVENOUS | Status: AC
  Filled 2020-12-21: qty 500

## 2020-12-21 MED ORDER — LIDOCAINE HCL (PF) 1 % IJ SOLN
INTRAMUSCULAR | Status: DC | PRN
  Administered 2020-12-21: 2 mL

## 2020-12-21 SURGICAL SUPPLY — 9 items
BAG SNAP BAND KOVER 36X36 (MISCELLANEOUS) ×2 IMPLANT
COVER DOME SNAP 22 D (MISCELLANEOUS) ×2 IMPLANT
KIT MICROPUNCTURE NIT STIFF (SHEATH) ×2 IMPLANT
PROTECTION STATION PRESSURIZED (MISCELLANEOUS) ×2
SHEATH PROBE COVER 6X72 (BAG) ×2 IMPLANT
STATION PROTECTION PRESSURIZED (MISCELLANEOUS) ×1 IMPLANT
STOPCOCK MORSE 400PSI 3WAY (MISCELLANEOUS) ×2 IMPLANT
TRAY PV CATH (CUSTOM PROCEDURE TRAY) ×2 IMPLANT
TUBING CIL FLEX 10 FLL-RA (TUBING) ×2 IMPLANT

## 2020-12-21 NOTE — H&P (Signed)
H&P     MRN #:  [id number]  History of Present Illness: This is a 75 y.o. female with multiple medical problems including end-stage renal disease on dialysis Monday Wednesday Friday that presents for left arm fistulogram.  She states her fistula has had issues for the last several months with prolonged bleeding at the end of dialysis.  She is on Eliquis but this has been held since Sunday.  Left brachiocephalic AVF placed 50/27/74.  Past Medical History:  Diagnosis Date  . A-fib ([place])   . Allergies   . Arthritis   . Asthma   . Cardiomyopathy ([place])   . CHF (congestive heart failure) ([place])   . Chronic kidney disease   . Edema   . History of hemodialysis   . Hyperlipidemia   . Hypertension   . Pulmonary hypertension ([place])   . Sleep apnea   . Tricuspid regurgitation     Past Surgical History:  Procedure Laterality Date  . A/V FISTULAGRAM Left 05/23/2020   Procedure: A/V FISTULAGRAM;  Surgeon: [name], MD;  Location: [place] [facility];  Service: Cardiovascular;  Laterality: Left;  . ABDOMINAL HYSTERECTOMY    . AV FISTULA PLACEMENT Left 11/04/2019   Procedure: Insertion Of Arteriovenous (Av) Gore-Tex Graft Arm;  Surgeon: [name], MD;  Location: [place];  Service: Vascular;  Laterality: Left;  . BIOPSY  11/14/2020   Procedure: BIOPSY;  Surgeon: Yetta Flock, MD;  Location: [place];  Service: Gastroenterology;;  . BIOPSY  11/16/2020   Procedure: BIOPSY;  Surgeon: Yetta Flock, MD;  Location: [place];  Service: Gastroenterology;;  . CARDIAC CATHETERIZATION    . CARDIOVERSION N/A 10/26/2019   Procedure: CARDIOVERSION;  Surgeon: [name], MD;  Location: [facility] ENDOSCOPY;  Service: Cardiovascular;  Laterality: N/A;  . COLONOSCOPY W/ BIOPSIES AND POLYPECTOMY    . COLONOSCOPY WITH PROPOFOL N/A 11/16/2020   Procedure: COLONOSCOPY WITH PROPOFOL;  Surgeon: Yetta Flock, MD;  Location: [place];  Service: Gastroenterology;   Laterality: N/A;  . ESOPHAGOGASTRODUODENOSCOPY (EGD) WITH PROPOFOL Left 11/14/2020   Procedure: ESOPHAGOGASTRODUODENOSCOPY (EGD) WITH PROPOFOL;  Surgeon: Yetta Flock, MD;  Location: [place];  Service: Gastroenterology;  Laterality: Left;  . FISTULA SUPERFICIALIZATION Left 01/21/2020   Procedure: FISTULA SUPERFICIALIZATION OF FISTULA;  Surgeon: [name], MD;  Location: [place];  Service: Vascular;  Laterality: Left;  . GIVENS CAPSULE STUDY N/A 11/16/2020   Procedure: GIVENS CAPSULE STUDY;  Surgeon: Yetta Flock, MD;  Location: [place];  Service: Gastroenterology;  Laterality: N/A;  . INSERTION OF DIALYSIS CATHETER Right 11/04/2019   Procedure: CONVERT TEMPORARY DIALYSIS CATHETER TO TUNNELED DIALYSIS CATHETER Right Internal Jugular.;  Surgeon: [name], MD;  Location: [place];  Service: Vascular;  Laterality: Right;  . IR THORACENTESIS ASP PLEURAL SPACE W/IMG GUIDE  08/17/2020  . MULTIPLE TOOTH EXTRACTIONS    . POLYPECTOMY  11/16/2020   Procedure: POLYPECTOMY;  Surgeon: Yetta Flock, MD;  Location: [facility] ENDOSCOPY;  Service: Gastroenterology;;  . REVISION OF ARTERIOVENOUS GORETEX GRAFT Left 01/21/2020   Procedure: REVISION OF ARTERIOVENOUS FISTULA WITH SIDE BRANCH LIGATION;  Surgeon: [name], MD;  Location: [place];  Service: Vascular;  Laterality: Left;  . TEE WITHOUT CARDIOVERSION N/A 10/26/2019   Procedure: TRANSESOPHAGEAL ECHOCARDIOGRAM (TEE);  Surgeon: [name], MD;  Location: [facility] ENDOSCOPY;  Service: Cardiovascular;  Laterality: N/A;  . THORACENTESIS     x 2  . THORACENTESIS N/A 11/07/2020   Procedure: Mathews Robinsons;  Surgeon: Hunsucker,  Bonna Gains, MD;  Location: [facility] ENDOSCOPY;  Service: Pulmonary;  Laterality: N/A;    Allergies  Allergen Reactions  . [employer]  . Shellfish Allergy Itching    Makes throat itch     Prior to Admission medications   Medication Sig Start Date End Date Taking? Authorizing  Provider  acetaminophen (TYLENOL) 325 MG tablet Take 2 tablets (650 mg total) by mouth every 4 (four) hours as needed for headache or mild pain. 11/05/19  Yes Buriev, Arie Sabina, MD  albuterol (VENTOLIN HFA) 108 (90 Base) MCG/ACT inhaler Inhale 2 puffs into the lungs every 6 (six) hours as needed for wheezing or shortness of breath. 11/30/19  Yes [name], NP  allopurinol (ZYLOPRIM) 300 MG tablet TAKE 1 TABLET BY MOUTH AT BEDTIME Patient taking differently: Take 300 mg by mouth daily. 11/24/20  Yes [name], NP  apixaban (ELIQUIS) 5 MG TABS tablet Take 1 tablet (5 mg total) by mouth 2 (two) times daily. 11/27/20  Yes [name], MD  atorvastatin (LIPITOR) 80 MG tablet Take 1 tablet (80 mg total) by mouth at bedtime. 08/22/20  Yes [name], MD  cetirizine (ZYRTEC) 10 MG tablet Take 10 mg by mouth daily as needed for allergies.   Yes [provider]  cinacalcet (SENSIPAR) 30 MG tablet Take 30 mg by mouth daily with supper.  04/18/20  Yes [provider]  ferric citrate (AURYXIA) 1 GM 210 MG(Fe) tablet Take 210 mg by mouth 3 (three) times daily with meals.   Yes [provider]  furosemide (LASIX) 40 MG tablet Take 80 mg by mouth See admin instructions. Take 2 tablets (80 mg) by mouth twice daily on non-dialysis days - Sunday, Tuesday, Thursday   Yes [provider]  lidocaine-prilocaine (EMLA) cream Apply 1 application topically See admin instructions. Apply topically to port access one hour prior to dialysis - Monday, Wednesday, Friday, Saturday 04/07/20  Yes [provider]  metoprolol succinate (TOPROL-XL) 50 MG 24 hr tablet Take 0.5 tablets (25 mg total) by mouth in the morning and at bedtime. Take with or immediately following a meal. Patient taking differently: Take 50 mg by mouth daily. Take with or immediately following a meal. 11/27/20  Yes [name], MD  midodrine (PROAMATINE) 10 MG tablet Take 1 tablet (10 mg total)  by mouth daily. 11/20/20  Yes Pahwani, Rinka R, MD  montelukast (SINGULAIR) 10 MG tablet Take 1 tablet by mouth once daily Patient taking differently: Take 10 mg by mouth at bedtime. 11/28/20  Yes [name], NP  multivitamin (RENA-VIT) TABS tablet Take 1 tablet by mouth daily.   Yes [provider]  Nutritional Supplements (,FEEDING SUPPLEMENT, PROSOURCE PLUS) liquid Take 30 mLs by mouth 2 (two) times daily between meals. 11/19/20  Yes Pahwani, Rinka R, MD  pantoprazole (PROTONIX) 40 MG tablet Take 1 tablet (40 mg total) by mouth daily at 6 (six) AM. 11/20/20  Yes Pahwani, Rinka R, MD  vitamin B-12 (CYANOCOBALAMIN) 1000 MCG tablet Take 1,000 mcg by mouth daily.   Yes [provider]  vitamin C (VITAMIN C) 500 MG tablet Take 1 tablet (500 mg total) by mouth daily. 11/06/19  Yes Kinnie Feil, MD    Social History   Socioeconomic History  . Marital status: Widowed    Spouse name: Not on file  . Number of children: Not on file  . Years of education: Not on file  . Highest education level: Not  on file  Occupational History  . Not on file  Tobacco Use  . Smoking status: Former Smoker    Types: Cigarettes  . Smokeless tobacco: Never Used  Vaping Use  . Vaping Use: Never used  Substance and Sexual Activity  . Alcohol use: Not Currently  . Drug use: Not Currently  . Sexual activity: Not Currently  Other Topics Concern  . Not on file  Social History Narrative  . Not on file   Social Determinants of Health   Financial Resource Strain: Not on file  Food Insecurity: Not on file  Transportation Needs: No Transportation Needs  . Lack of Transportation (Medical): No  . Lack of Transportation (Non-Medical): No  Physical Activity: Not on file  Stress: Not on file  Social Connections: Socially Isolated  . Frequency of Communication with Friends and Family: More than three times a week  . Frequency of Social Gatherings with Friends and Family: Once a week  .  Attends Religious Services: Never  . Active Member of Clubs or Organizations: No  . Attends Archivist Meetings: Never  . Marital Status: Widowed  Intimate Partner Violence: Not on file     Family History  Problem Relation Age of Onset  . Seizures Father   . CAD Father   . Diabetes Sister   . Lupus Sister   . Hypertension Sister   . Colon cancer Neg Hx   . Esophageal cancer Neg Hx   . Pancreatic cancer Neg Hx   . Stomach cancer Neg Hx     ROS: [x]  Positive   [ ]  Negative   [ ]  All sytems reviewed and are negative  Cardiovascular: []  chest pain/pressure []  palpitations []  SOB lying flat []  DOE []  pain in legs while walking []  pain in legs at rest []  pain in legs at night []  non-healing ulcers []  hx of DVT []  swelling in legs  Pulmonary: []  productive cough []  asthma/wheezing []  home O2  Neurologic: []  weakness in []  arms []  legs []  numbness in []  arms []  legs []  hx of CVA []  mini stroke [] difficulty speaking or slurred speech []  temporary loss of vision in one eye []  dizziness  Hematologic: []  hx of cancer []  bleeding problems []  problems with blood clotting easily  Endocrine:   []  diabetes []  thyroid disease  GI []  vomiting blood []  blood in stool  GU: []  CKD/renal failure []  HD--[]  M/W/F or []  T/T/S []  burning with urination []  blood in urine  Psychiatric: []  anxiety []  depression  Musculoskeletal: []  arthritis []  joint pain  Integumentary: []  rashes []  ulcers  Constitutional: []  fever []  chills   Physical Examination  Vitals:   12/21/20 0523  BP: 90/63  Pulse: (!) 113  Temp: 98.3 F (36.8 C)  SpO2: 100%   Body mass index is 36.85 kg/m.  General:   NAD HENT: WNL, normocephalic Pulmonary: normal non-labored breathing Abdomen: soft, NT/ND Vascular Exam/Pulses: Left arm AVF with thrill  CBC    Component Value Date/Time   WBC 5.3 12/14/2020 1525   RBC 3.33 (L) 12/14/2020 1525   HGB 12.2 12/21/2020 0604    HGB 11.4 06/22/2020 1126   HCT 36.0 12/21/2020 0604   HCT 35.1 06/22/2020 1126   PLT 155 12/14/2020 1525   PLT 141 (L) 06/22/2020 1126   MCV 103.3 (H) 12/14/2020 1525   MCV 96 06/22/2020 1126   MCH 30.6 12/14/2020 1525   MCHC 29.7 (L) 12/14/2020 1525   RDW 17.9 (H) 12/14/2020  1525   RDW 14.9 06/22/2020 1126   LYMPHSABS 1.0 11/17/2020 0155   LYMPHSABS 1.4 06/22/2020 1126   MONOABS 1.1 (H) 11/17/2020 0155   EOSABS 0.0 11/17/2020 0155   EOSABS 0.0 06/22/2020 1126   BASOSABS 0.0 11/17/2020 0155   BASOSABS 0.0 06/22/2020 1126    BMET    Component Value Date/Time   NA 136 12/21/2020 0604   NA 141 06/22/2020 1126   K 5.3 (H) 12/21/2020 0604   CL 97 (L) 12/21/2020 0604   CO2 24 11/19/2020 0306   GLUCOSE 92 12/21/2020 0604   BUN 29 (H) 12/21/2020 0604   BUN 28 (H) 06/22/2020 1126   CREATININE 5.60 (H) 12/21/2020 0604   CALCIUM 7.2 (L) 11/19/2020 0306   GFRNONAA 8 (L) 11/19/2020 0306   GFRAA 9 (L) 07/01/2020 1735    COAGS: Lab Results  Component Value Date   INR 1.4 (H) 11/14/2020   INR 1.8 (H) 08/31/2020   INR 1.7 (H) 06/12/2020     Non-Invasive Vascular Imaging:    None   ASSESSMENT/PLAN: This is a 75 y.o. female with end-stage renal disease and malfunctioning left arm AV fistula.  Patient is scheduled for left arm fistulogram.  Risk benefits discussed     [name], MD [facility] Office: [place]

## 2020-12-21 NOTE — Discharge Instructions (Signed)
Dialysis Fistulogram, Care After The following information offers guidance on how to care for yourself after your procedure. Your health care provider may also give you more specific instructions. If you have problems or questions, contact your health care provider. What can I expect after the procedure? After the procedure, it is common to have:  A small amount of discomfort in the area where the small tube (catheter) was placed for the procedure.  A small amount of bruising around the fistula.  Sleepiness and tiredness (fatigue). Follow these instructions at home: Puncture site care  Follow instructions from your health care provider about how to take care of the site where catheters were inserted. Make sure you: ? Wash your hands with soap and water for at least 20 seconds before and after you change your bandage (dressing). If soap and water are not available, use hand sanitizer. ? Change your dressing as told by your health care provider. ? Leave stitches (sutures), skin glue, or adhesive strips in place. These skin closures may need to stay in place for 2 weeks or longer. If adhesive strip edges start to loosen and curl up, you may trim the loose edges. Do not remove adhesive strips completely unless your health care provider tells you to do that.  Check your puncture area every day for signs of infection. Check for: ? More redness, swelling, or pain. ? Fluid or blood. ? Warmth. ? Pus or a bad smell.   Activity  Rest as much as you can.  If you were given a sedative during the procedure, it can affect you for several hours. Do not drive or operate machinery until your health care provider says that it is safe.  Do not lift anything that is heavier than 5 lb (2.3 kg), or the limit that you are told, on the day of your procedure.  Do not do anything strenuous with your arm for the rest of the day. Avoid household activities, such as vacuuming.  Return to your normal activities as  told by your health care provider. Ask your health care provider what activities are safe for you. Safety To prevent damage to your graft or fistula:  Do not wear tight-fitting clothing or jewelry on the arm or leg that has your graft or fistula.  Tell all your health care providers that you have a dialysis fistula or graft.  Do not allow blood draws, IVs, or blood pressure readings to be done in the arm that has your fistula or graft.  Do not allow flu shots or vaccinations in the arm with your fistula or graft. General instructions  Take over-the-counter and prescription medicines only as told by your health care provider.  Do not take baths, swim, or use a hot tub until your health care provider approves. Ask your health care provider if you may take showers. You may only be allowed to take sponge baths.  Monitor your dialysis fistula closely. Check to make sure that you can feel a vibration or buzz (a thrill) when you put your fingers over the fistula.  Keep all follow-up visits. This is important. Contact a health care provider if:  You have more redness, swelling, or pain at the site where the catheter was put in.  You have fluid or blood coming from the catheter site.  You have pus or a bad smell coming from the catheter site.  Your catheter site feels warm.  You have a fever or chills. Get help right away if:    You have bleeding from the vascular access site that does not stop.  You feel weak.  You have trouble balancing.  You have trouble moving your arms or legs.  You have problems with your speech or vision.  You can no longer feel a vibration or buzz when you put your fingers over your fistula.  The limb that was used for the procedure swells or becomes painful, cold, blue, or pale white.  You have chest pain or shortness of breath. These symptoms may represent a serious problem that is an emergency. Do not wait to see if the symptoms will go away. Get  medical help right away. Call your local emergency services (911 in the [place] Do not drive yourself to the hospital. Summary  After a dialysis fistulogram, it is common to have a small amount of discomfort or bruising in the area where the small, thin tube (catheter) was placed.  Rest as much as you can after your procedure. Return to your normal activities as told by your health care provider.  Take over-the-counter and prescription medicines only as told by your health care provider.  Follow instructions from your health care provider about how to take care of the site where the catheter was inserted.  Keep all follow-up visits. This is important. This information is not intended to replace advice given to you by your health care provider. Make sure you discuss any questions you have with your health care provider. Document Revised: 06/21/2020 Document Reviewed: 06/21/2020 Elsevier Patient Education  2021 Elsevier Inc.  

## 2020-12-21 NOTE — Op Note (Signed)
    OPERATIVE NOTE   PROCEDURE: 1. left brachiocephalic arteriovenous fistula cannulation under ultrasound guidance 2. left arm fistulogram including central venogram  PRE-OPERATIVE DIAGNOSIS: Malfunctioning left arteriovenous fistula  POST-OPERATIVE DIAGNOSIS: same as above   SURGEON: [name], MD  ANESTHESIA: local  ESTIMATED BLOOD LOSS: 5 cc  FINDING(S): 1. Widely patent left brachiocephalic AV fistula with no evidence of central or peripheral stenosis. Good thrill on exam as well.  SPECIMEN(S):  None  CONTRAST: 40 cc  INDICATIONS: Melissa Adams is a 75 y.o. female who  presents with malfunctioning left brachiocephalic arteriovenous fistula.  The patient is scheduled for left arm fistulogram.  The patient is aware the risks include but are not limited to: bleeding, infection, thrombosis of the cannulated access, and possible anaphylactic reaction to the contrast.  The patient is aware of the risks of the procedure and elects to proceed forward.  DESCRIPTION: After full informed written consent was obtained, the patient was brought back to the angiography suite and placed supine upon the angiography table.  The patient was connected to monitoring equipment.  The left arm fistula was prepped and draped in the standard fashion for a left arm fistulogram.  Under ultrasound guidance, the left arm arteriovenous fistula was evaluated, it was patent, an image was saved.  It was cannulated with a micropuncture needle under ultrasound guidance.  The microwire was advanced into the fistula and the needle was exchanged for the a microsheath, which was lodged 2 cm into the access.  The wire was removed and the sheath was connected to the IV extension tubing.  Hand injections were completed to image the access from the antecubitum up to the level of axilla.  The central venous structures were also imaged by hand injections.  A reflux image was also obtained with a blood pressure  cuff on the upper arm.  Based on the images, this patient will need: no intervention and has a widely patent fistula with good thrill.  A 4-0 Monocryl purse-string suture was sewn around the sheath.  The sheath was removed while tying down the suture.  A sterile bandage was applied to the puncture site.  COMPLICATIONS: None  CONDITION: Stable  [name], MD [facility] Office: [place]   12/21/2020 9:01 AM

## 2020-12-21 NOTE — Progress Notes (Signed)
Discharge instructions reviewed with pt voices understanding.  

## 2020-12-25 ENCOUNTER — Telehealth: Payer: Self-pay

## 2020-12-25 NOTE — Telephone Encounter (Signed)
Spoke to [place] at [facility] center. Patient is concerned about visible suture left over from fistulogram. Advised that it would dissolve on it's own, but they could clip it closer to the skin if desired. Verbalized understanding.

## 2020-12-27 ENCOUNTER — Telehealth: Payer: Self-pay

## 2020-12-27 NOTE — Telephone Encounter (Signed)
Schub, Marsh Dolly, RN  Freada Bergeron, CMA Hey, so she refused cpap and ref to ENT, Dr Aundra Dubin wants to just get her oxygen to wear at night. Her sleep study was done in May 2021 can I use that to get the oxygen or will she need an ONO test?

## 2020-12-27 NOTE — Telephone Encounter (Signed)
-----   Message from Irving Copas., MD sent at 12/27/2020  8:04 AM EST ----- Thanks VC. Linah Klapper, please move forward with a clinic visit to discuss role of potential resection in setting of her multiple comorbidities. Thanks. GM ----- Message ----- From: Lavena Bullion, DO Sent: 12/27/2020   7:53 AM EST To: Irving Copas., MD  Good point. She is probably a good one to see in clinic first and talk about everything. Thanks!  VC ----- Message ----- From: Irving Copas., MD Sent: 12/27/2020   3:46 AM EST To: Lavena Bullion, DO  VC, With all of her issues and her anticoagulation needs, should I see her in clinic and talk about things or just get her scheduled in the next couple of months? Let me know your thoughts and then will go from there. Thanks. GM ----- Message ----- From: Lavena Bullion, DO Sent: 12/22/2020  11:16 AM EST To: Irving Copas., MD  GM   This is a patient that SA recently saw during hospitalization for GIB/anemia. 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 Imaging otherwise unremarkable. I sent a referral your way for consideration of EUS with EMR. Thank you.  VC

## 2020-12-27 NOTE — Telephone Encounter (Signed)
Melissa Adams has the pt scheduled for an office visit with Dr Rush Landmark .

## 2021-01-02 ENCOUNTER — Encounter: Payer: Self-pay | Admitting: Pulmonary Disease

## 2021-01-02 ENCOUNTER — Other Ambulatory Visit: Payer: Self-pay

## 2021-01-02 ENCOUNTER — Ambulatory Visit (INDEPENDENT_AMBULATORY_CARE_PROVIDER_SITE_OTHER): Payer: Medicare HMO | Admitting: Pulmonary Disease

## 2021-01-02 VITALS — BP 122/86 | HR 41 | Temp 97.2°F | Ht 63.0 in | Wt 199.2 lb

## 2021-01-02 DIAGNOSIS — J90 Pleural effusion, not elsewhere classified: Secondary | ICD-10-CM

## 2021-01-02 NOTE — Patient Instructions (Addendum)
Please give [place] a call if you develop worsening shortness of breath and then we can get you scheduled for thoracentesis to drain the fluid again.   The fluid is likely coming from your heart, kidney and liver disease.

## 2021-01-02 NOTE — Progress Notes (Signed)
Synopsis: Referred in January 2022 by [name], NP for Pleural Effusion  Subjective:   PATIENT ID: Melissa Adams GENDER: female DOB: August 13, 1946, MRN: [id number]   HPI  Chief Complaint  Patient presents with  . Consult    Referred back to our office for pleural effusion. Had a thoracentesis done in December 2021 by Dr. Silas Flood. States she has doing well since last procedure. Does not feel like the fluid is completely back.     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   She was last seen in clinic on 10/12/20 by Dr. Silas Flood and underwent thoracentesis on 11/07/20 where 1452mL of serosanguinous fluid was removed. The pleural fluid was lymphocyte predominant with an LDH of 141 and total protein of 3.4, albumin 2.2 with cytology negative for malignant cells.    She has had undergone 4 thoracenteses in the past year with pleural fluid studies concerning for an exudative effusion.   She was admitted 11/14/20 to 11/19/20 for GI bleeding and underwent her 4th thoracentesis during that stay.    She has progressive shortness of breath when the fluid re-accumulates. Today she feels well and is not experiencing any shortness of breath.    Past Medical History:  Diagnosis Date  . A-fib ([place])   . Allergies   . Arthritis   . Asthma   . Cardiomyopathy ([place])   . CHF (congestive heart failure) ([place])   . Chronic kidney disease   . Edema   . History of hemodialysis   . Hyperlipidemia   . Hypertension   . Pulmonary hypertension ([place])   . Sleep apnea   . Tricuspid regurgitation      Family History  Problem Relation Age of Onset  . Seizures Father   . CAD Father   . Diabetes Sister   . Lupus Sister   . Hypertension Sister   . Colon cancer Neg Hx   . Esophageal cancer Neg Hx   . Pancreatic cancer Neg  Hx   . Stomach cancer Neg Hx      Social History   Socioeconomic History  . Marital status: Widowed    Spouse name: Not on file  . Number of children: Not on file  . Years of education: Not on file  . Highest education level: Not on file  Occupational History  . Not on file  Tobacco Use  . Smoking status: Former Smoker    Types: Cigarettes  . Smokeless tobacco: Never Used  Vaping Use  . Vaping Use: Never used  Substance and Sexual Activity  . Alcohol use: Not Currently  . Drug use: Not Currently  . Sexual activity: Not Currently  Other Topics Concern  . Not on file  Social History Narrative  . Not on file   Social Determinants of Health   Financial Resource Strain: Not on file  Food Insecurity: Not on file  Transportation Needs: No Transportation Needs  . Lack of Transportation (Medical): No  . Lack of Transportation (Non-Medical): No  Physical Activity: Not on file  Stress: Not on file  Social Connections: Socially Isolated  . Frequency of Communication with Friends and Family: More than three times a week  . Frequency of Social Gatherings with Friends and Family: Once a week  . Attends Religious Services: Never  . Active Member of Clubs or  Organizations: No  . Attends Archivist Meetings: Never  . Marital Status: Widowed  Intimate Partner Violence: Not on file     Allergies  Allergen Reactions  . [employer]  . Shellfish Allergy Itching    Makes throat itch      Outpatient Medications Prior to Visit  Medication Sig Dispense Refill  . acetaminophen (TYLENOL) 325 MG tablet Take 2 tablets (650 mg total) by mouth every 4 (four) hours as needed for headache or mild pain.    Marland Kitchen albuterol (VENTOLIN HFA) 108 (90 Base) MCG/ACT inhaler Inhale 2 puffs into the lungs every 6 (six) hours as needed for wheezing or shortness of breath. 6.7 g 1  . allopurinol (ZYLOPRIM) 300 MG tablet TAKE 1 TABLET BY MOUTH AT BEDTIME (Patient taking differently:  Take 300 mg by mouth daily.) 30 tablet 3  . apixaban (ELIQUIS) 5 MG TABS tablet Take 1 tablet (5 mg total) by mouth 2 (two) times daily. 60 tablet 3  . atorvastatin (LIPITOR) 80 MG tablet Take 1 tablet (80 mg total) by mouth at bedtime. 30 tablet 5  . cetirizine (ZYRTEC) 10 MG tablet Take 10 mg by mouth daily as needed for allergies.    . cinacalcet (SENSIPAR) 30 MG tablet Take 30 mg by mouth daily with supper.     . ferric citrate (AURYXIA) 1 GM 210 MG(Fe) tablet Take 210 mg by mouth 3 (three) times daily with meals.    . furosemide (LASIX) 40 MG tablet Take 80 mg by mouth See admin instructions. Take 2 tablets (80 mg) by mouth twice daily on non-dialysis days - Sunday, Tuesday, Thursday    . lidocaine-prilocaine (EMLA) cream Apply 1 application topically See admin instructions. Apply topically to port access one hour prior to dialysis - Monday, Wednesday, Friday, Saturday    . metoprolol succinate (TOPROL-XL) 50 MG 24 hr tablet Take 0.5 tablets (25 mg total) by mouth in the morning and at bedtime. Take with or immediately following a meal. (Patient taking differently: Take 50 mg by mouth daily. Take with or immediately following a meal.) 60 tablet 3  . midodrine (PROAMATINE) 10 MG tablet Take 1 tablet (10 mg total) by mouth daily. 30 tablet 0  . montelukast (SINGULAIR) 10 MG tablet Take 1 tablet by mouth once daily (Patient taking differently: Take 10 mg by mouth at bedtime.) 90 tablet 0  . multivitamin (RENA-VIT) TABS tablet Take 1 tablet by mouth daily.    . Nutritional Supplements (,FEEDING SUPPLEMENT, PROSOURCE PLUS) liquid Take 30 mLs by mouth 2 (two) times daily between meals.    . pantoprazole (PROTONIX) 40 MG tablet Take 1 tablet (40 mg total) by mouth daily at 6 (six) AM. 30 tablet 0  . vitamin B-12 (CYANOCOBALAMIN) 1000 MCG tablet Take 1,000 mcg by mouth daily.    . vitamin C (VITAMIN C) 500 MG tablet Take 1 tablet (500 mg total) by mouth daily. 30 tablet 0   No facility-administered  medications prior to visit.    Review of Systems  Constitutional: Negative for chills, fever, malaise/fatigue and weight loss.  HENT: Negative for congestion, sinus pain and sore throat.   Eyes: Negative.   Respiratory: Negative for cough, hemoptysis, sputum production, shortness of breath and wheezing.   Cardiovascular: Negative for chest pain, palpitations, orthopnea, claudication and leg swelling.  Gastrointestinal: Negative for abdominal pain, heartburn, nausea and vomiting.  Genitourinary: Negative.   Musculoskeletal: Negative for joint pain and myalgias.  Skin: Negative for rash.  Neurological:  Negative for weakness.  Endo/Heme/Allergies: Negative.   Psychiatric/Behavioral: Negative.    Objective:   Vitals:   01/02/21 0906  BP: 122/86  Pulse: (!) 41  Temp: (!) 97.2 F (36.2 C)  TempSrc: Temporal  SpO2: 98%  Weight: 199 lb 3.2 oz (90.4 kg)  Height: 5\' 3"  (1.6 m)     Physical Exam Constitutional:      General: She is not in acute distress.    Appearance: She is obese.  HENT:     Head: Normocephalic and atraumatic.  Eyes:     General: No scleral icterus.    Conjunctiva/sclera: Conjunctivae normal.     Pupils: Pupils are equal, round, and reactive to light.  Cardiovascular:     Rate and Rhythm: Normal rate and regular rhythm.     Pulses: Normal pulses.     Heart sounds: Normal heart sounds. No murmur heard.   Pulmonary:     Effort: Pulmonary effort is normal.     Breath sounds: Examination of the right-lower field reveals decreased breath sounds. Decreased breath sounds present. No wheezing, rhonchi or rales.  Abdominal:     General: Bowel sounds are normal.     Palpations: Abdomen is soft.  Musculoskeletal:     Right lower leg: No edema.     Left lower leg: No edema.  Lymphadenopathy:     Cervical: No cervical adenopathy.  Skin:    General: Skin is warm and dry.     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     Mental Status: She is alert.  Psychiatric:        Mood and Affect: Mood normal.        Behavior: Behavior normal.        Thought Content: Thought content normal.        Judgment: Judgment normal.    CBC    Component Value Date/Time   WBC 5.3 12/14/2020 1525   RBC 3.33 (L) 12/14/2020 1525   HGB 12.2 12/21/2020 0604   HGB 11.4 06/22/2020 1126   HCT 36.0 12/21/2020 0604   HCT 35.1 06/22/2020 1126   PLT 155 12/14/2020 1525   PLT 141 (L) 06/22/2020 1126   MCV 103.3 (H) 12/14/2020 1525   MCV 96 06/22/2020 1126   MCH 30.6 12/14/2020 1525   MCHC 29.7 (L) 12/14/2020 1525   RDW 17.9 (H) 12/14/2020 1525   RDW 14.9 06/22/2020 1126   LYMPHSABS 1.0 11/17/2020 0155   LYMPHSABS 1.4 06/22/2020 1126   MONOABS 1.1 (H) 11/17/2020 0155   EOSABS 0.0 11/17/2020 0155   EOSABS 0.0 06/22/2020 1126   BASOSABS 0.0 11/17/2020 0155   BASOSABS 0.0 06/22/2020 1126   BMP Latest Ref Rng & Units 12/21/2020 11/19/2020 11/17/2020  Glucose 70 - 99 mg/dL 92 121(H) 109(H)  BUN 8 - 23 mg/dL 29(H) 22 14  Creatinine 0.44 - 1.00 mg/dL 5.60(H) 5.12(H) 3.83(H)  BUN/Creat Ratio 12 - 28 - - -  Sodium 135 - 145 mmol/L 136 133(L) 134(L)  Potassium 3.5 - 5.1 mmol/L 5.3(H) 4.2 3.9  Chloride 98 - 111 mmol/L 97(L) 97(L) 98  CO2 22 - 32 mmol/L - 24 22  Calcium 8.9 - 10.3 mg/dL - 7.2(L) 7.6(L)   Chest imaging: Chest imaging reviewied. Pleural effusion was present starting in June 2021 chest imaging. Chest imaging at the end of 2020 did not show a pleural effusion.   PFT: No flowsheet data  found.  Path: Cytology negative on 11/18/20, 11/07/20 and 06/11/20 for malignant cells  Echo: 11/17/20 1. Left ventricular ejection fraction, by estimation, is 35 to 40%. The  left ventricle has moderately decreased function. The left ventricle  demonstrates global hypokinesis. There is mild concentric left ventricular  hypertrophy. Left ventricular  diastolic parameters are indeterminate.   2. Right ventricular systolic function is normal. The right ventricular  size is normal. There is moderately elevated pulmonary artery systolic  pressure.  3. The mitral valve is normal in structure. Mild mitral valve  regurgitation. No evidence of mitral stenosis.  4. Tricuspid valve regurgitation is moderate to severe.  5. There is restriction of the right and noncoronary cusps. Visually  there appears to be at least moderate aortic stenosis. LVOT VTI was not  recorded, so cannot calculate a dimensionless index. Cannot exclude  low-flow, low gradient aortic stenosis. The  aortic valve is calcified. There is moderate calcification of the aortic  valve. There is moderate thickening of the aortic valve. Aortic valve  regurgitation is not visualized. Mild to moderate aortic valve  sclerosis/calcification is present, without any  evidence of aortic stenosis. Aortic valve mean gradient measures 9.0 mmHg.  Aortic valve Vmax measures 2.13 m/s.  6. The inferior vena cava is dilated in size with <50% respiratory  variability, suggesting right atrial pressure of 15 mmHg.    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   Her chronic right pleural effusion is likely secondary to her heart failure, renal failure and her cirrhosis. The pleural fluid is consistently exudative in nature with a lymphocyte predominance over the past 2 pleural fluid samples.   We will perform a thoracentesis in the future if needed for relief of dyspnea. Would recommend sending pleural fluid cholesterol and triglyceride levels along with the routine pleural fluid studies. Would also recommend sending serum LDH and total protein at the same time in order to accurately perform Light's  Criteria.  Patient to follow up in 2 months.  >60 minutes was spent reviewing patient's records/imaging, direct patient care, speaking with patient's daughter and completing documentation.   Freda Jackson, MD [facility] Office: [phone]   See Amion for Pager Details    Current Outpatient Medications:  .  acetaminophen (TYLENOL) 325 MG tablet, Take 2 tablets (650 mg total) by mouth every 4 (four) hours as needed for headache or mild pain., Disp:  , Rfl:  .  albuterol (VENTOLIN HFA) 108 (90 Base) MCG/ACT inhaler, Inhale 2 puffs into the lungs every 6 (six) hours as needed for wheezing or shortness of breath., Disp: 6.7 g, Rfl: 1 .  allopurinol (ZYLOPRIM) 300 MG tablet, TAKE 1 TABLET BY MOUTH AT BEDTIME (Patient taking differently: Take 300 mg by mouth daily.), Disp: 30 tablet, Rfl: 3 .  apixaban (ELIQUIS) 5 MG TABS tablet, Take 1 tablet (5 mg total) by mouth 2 (two) times daily., Disp: 60 tablet, Rfl: 3 .  atorvastatin (LIPITOR) 80 MG tablet, Take 1 tablet (80 mg total) by mouth at bedtime., Disp: 30 tablet, Rfl: 5 .  cetirizine (ZYRTEC) 10 MG tablet, Take 10 mg by mouth daily as needed for allergies., Disp: , Rfl:  .  cinacalcet (SENSIPAR) 30 MG tablet, Take 30 mg by mouth daily with supper. ,  Disp: , Rfl:  .  ferric citrate (AURYXIA) 1 GM 210 MG(Fe) tablet, Take 210 mg by mouth 3 (three) times daily with meals., Disp: , Rfl:  .  furosemide (LASIX) 40 MG tablet, Take 80 mg by mouth See admin instructions. Take 2 tablets (80 mg) by mouth twice daily on non-dialysis days - Sunday, Tuesday, Thursday, Disp: , Rfl:  .  lidocaine-prilocaine (EMLA) cream, Apply 1 application topically See admin instructions. Apply topically to port access one hour prior to dialysis - Monday, Wednesday, Friday, Saturday, Disp: , Rfl:  .  metoprolol succinate (TOPROL-XL) 50 MG 24 hr tablet, Take 0.5 tablets (25 mg total) by mouth in the morning and at bedtime. Take with or immediately  following a meal. (Patient taking differently: Take 50 mg by mouth daily. Take with or immediately following a meal.), Disp: 60 tablet, Rfl: 3 .  midodrine (PROAMATINE) 10 MG tablet, Take 1 tablet (10 mg total) by mouth daily., Disp: 30 tablet, Rfl: 0 .  montelukast (SINGULAIR) 10 MG tablet, Take 1 tablet by mouth once daily (Patient taking differently: Take 10 mg by mouth at bedtime.), Disp: 90 tablet, Rfl: 0 .  multivitamin (RENA-VIT) TABS tablet, Take 1 tablet by mouth daily., Disp: , Rfl:  .  Nutritional Supplements (,FEEDING SUPPLEMENT, PROSOURCE PLUS) liquid, Take 30 mLs by mouth 2 (two) times daily between meals., Disp: , Rfl:  .  pantoprazole (PROTONIX) 40 MG tablet, Take 1 tablet (40 mg total) by mouth daily at 6 (six) AM., Disp: 30 tablet, Rfl: 0 .  vitamin B-12 (CYANOCOBALAMIN) 1000 MCG tablet, Take 1,000 mcg by mouth daily., Disp: , Rfl:  .  vitamin C (VITAMIN C) 500 MG tablet, Take 1 tablet (500 mg total) by mouth daily., Disp: 30 tablet, Rfl: 0

## 2021-01-09 ENCOUNTER — Encounter: Payer: Medicare HMO | Admitting: Vascular Surgery

## 2021-01-09 ENCOUNTER — Encounter (HOSPITAL_COMMUNITY): Payer: Medicare HMO

## 2021-01-17 ENCOUNTER — Telehealth: Payer: Self-pay | Admitting: Pulmonary Disease

## 2021-01-17 NOTE — Telephone Encounter (Signed)
I called and spoke with pts daughter and she stated that the pt has an upcoming procedure and she wanted to find out about this.  I referred her over to [facility] for this.   She was given the number for the [facility] office and [facility] office.

## 2021-01-24 ENCOUNTER — Ambulatory Visit (INDEPENDENT_AMBULATORY_CARE_PROVIDER_SITE_OTHER): Payer: Medicare HMO | Admitting: Gastroenterology

## 2021-01-24 ENCOUNTER — Telehealth: Payer: Self-pay

## 2021-01-24 ENCOUNTER — Encounter: Payer: Self-pay | Admitting: Gastroenterology

## 2021-01-24 VITALS — BP 100/58 | HR 82 | Ht 63.0 in | Wt 202.0 lb

## 2021-01-24 DIAGNOSIS — D3A8 Other benign neuroendocrine tumors: Secondary | ICD-10-CM | POA: Diagnosis not present

## 2021-01-24 DIAGNOSIS — Z7901 Long term (current) use of anticoagulants: Secondary | ICD-10-CM

## 2021-01-24 DIAGNOSIS — R198 Other specified symptoms and signs involving the digestive system and abdomen: Secondary | ICD-10-CM | POA: Diagnosis not present

## 2021-01-24 DIAGNOSIS — E340 Carcinoid syndrome: Secondary | ICD-10-CM

## 2021-01-24 NOTE — Patient Instructions (Signed)
If you are age 75 or older, your body mass index should be between 23-30. Your Body mass index is 35.78 kg/m. If this is out of the aforementioned range listed, please consider follow up with your Primary Care Provider.  You will be contaced by our office prior to your procedure for directions on holding your Eliquis.  If you do not hear from our office 1 week prior to your scheduled procedure, please call [phone] to discuss.  Your provider has requested that you labs drawn. You have requested to have your labs drawn at dialysis today. Please have them to fax results to ATTN: Mozel Burdett @ [phone].   Due to recent changes in healthcare laws, you may see the results of your imaging and laboratory studies on MyChart before your provider has had a chance to review them.  We understand that in some cases there may be results that are confusing or concerning to you. Not all laboratory results come back in the same time frame and the provider may be waiting for multiple results in order to interpret others.  Please give [place] 48 hours in order for your provider to thoroughly review all the results before contacting the office for clarification of your results.   Thank you for choosing me and [facility].  Dr. Rush Landmark

## 2021-01-24 NOTE — Telephone Encounter (Signed)
Patient with diagnosis of afib on Eliquis for anticoagulation.    Procedure: EUS+EMR Date of procedure: 02/22/21  CHA2DS2-VASc Score = 4  This indicates a 4.8% annual risk of stroke. The patient's score is based upon: CHF History: Yes HTN History: Yes Diabetes History: No Stroke History: No Vascular Disease History: No Age Score: 1 Gender Score: 1      CrCl 9.4 ml/min (pt is ESRD on HD)  Per office protocol, patient can hold Eliquis for 2 days prior to procedure.

## 2021-01-24 NOTE — Telephone Encounter (Signed)
Request for surgical clearance:     Endoscopy Procedure  What type of surgery is being performed?     EUS+EMR   When is this surgery scheduled?     02/22/21  What type of clearance is required ?   Pharmacy  Are there any medications that need to be held prior to surgery and how long? Eliquis x2 days prior to procedure.  Practice name and name of physician performing surgery?      [facility]-Dr.Mansouraty /[place] [facility]   What is your office phone and fax number?      Phone- [phone]  [phone]  Anesthesia type (None, local, MAC, general) ?       MAC

## 2021-01-24 NOTE — Progress Notes (Signed)
[place] VISIT   Primary Care Provider [name], NP [address] [place] [address] [phone]  Referring Provider Dr. Bryan Lemma  Patient Profile: Melissa Adams is a 75 y.o. female with a pmh significant for A. fib (on anticoagulation CHF, ESRD on HD, pulmonary hypertension, hypertension, hyperlipidemia, OSA, cirrhosis, neuroendocrine tumor.  The patient presents to the [facility] for an evaluation and management of problem(s) noted below:  Problem List 1. Neuroendocrine tumor of small intestine   2. Abnormal findings on esophagogastroduodenoscopy (EGD)   3. Chronic anticoagulation     History of Present Illness Please see initial consultation and progress notes by Dr. Bryan Lemma for full details of HPI.  Interval History This is a patient who I actually met in the hospital in December due to issues of acute blood loss anemia.  Endoscopic evaluation was performed including an EGD/colonoscopy/video capsule endoscopy.  During her evaluation on endoscopy a duodenal nodule was found.  This was biopsied.  This returned as a duodenal carcinoid/neuroendocrine tumor.  The patient was eventually discharged.  She has followed up with Dr. Bryan Lemma and with cardiology.  She has been overall stable and felt to be a potential candidate for further intervention of this duodenal neuroendocrine tumor.  It is for this reason that patient returns today to clinic.  She is accompanied by her family.  The patient describes having episodes of darker stools at times but nothing to the point of the jet black as prior.  She continues to have fatigue but overall is stable.  She denies any overt hematemesis or coffee-ground emesis.  Diarrhea is not normal for her.  GI Review of Systems Positive as above Negative for pyrosis, dysphagia, pain, change in bowel habits  Review of Systems General: Denies fevers/chills/weight loss  unintentionally Cardiovascular: Denies chest pain/palpitations Pulmonary: Denies shortness of breath Gastroenterological: See HPI Hematological: Positive for easy bruising/bleeding due to anticoagulation  Dermatological: Denies jaundice Psychological: Mood is stable   Medications Current Outpatient Medications  Medication Sig Dispense Refill  . acetaminophen (TYLENOL) 325 MG tablet Take 2 tablets (650 mg total) by mouth every 4 (four) hours as needed for headache or mild pain.    Marland Kitchen albuterol (VENTOLIN HFA) 108 (90 Base) MCG/ACT inhaler Inhale 2 puffs into the lungs every 6 (six) hours as needed for wheezing or shortness of breath. 6.7 g 1  . allopurinol (ZYLOPRIM) 300 MG tablet TAKE 1 TABLET BY MOUTH AT BEDTIME (Patient taking differently: Take 300 mg by mouth daily.) 30 tablet 3  . apixaban (ELIQUIS) 5 MG TABS tablet Take 1 tablet (5 mg total) by mouth 2 (two) times daily. 60 tablet 3  . atorvastatin (LIPITOR) 80 MG tablet Take 1 tablet (80 mg total) by mouth at bedtime. 30 tablet 5  . cetirizine (ZYRTEC) 10 MG tablet Take 10 mg by mouth daily as needed for allergies.    . cinacalcet (SENSIPAR) 30 MG tablet Take 30 mg by mouth daily with supper.     . ferric citrate (AURYXIA) 1 GM 210 MG(Fe) tablet Take 210 mg by mouth 3 (three) times daily with meals.    . furosemide (LASIX) 40 MG tablet Take 80 mg by mouth See admin instructions. Take 2 tablets (80 mg) by mouth twice daily on non-dialysis days - Sunday, Tuesday, Thursday    . lidocaine-prilocaine (EMLA) cream Apply 1 application topically See admin instructions. Apply topically to port access one hour prior to dialysis - Monday, Wednesday, Friday, Saturday    .  metoprolol succinate (TOPROL-XL) 50 MG 24 hr tablet Take 0.5 tablets (25 mg total) by mouth in the morning and at bedtime. Take with or immediately following a meal. (Patient taking differently: Take 50 mg by mouth daily. Take with or immediately following a meal.) 60 tablet 3  .  midodrine (PROAMATINE) 10 MG tablet Take 1 tablet (10 mg total) by mouth daily. 30 tablet 0  . montelukast (SINGULAIR) 10 MG tablet Take 1 tablet by mouth once daily (Patient taking differently: Take 10 mg by mouth at bedtime.) 90 tablet 0  . multivitamin (RENA-VIT) TABS tablet Take 1 tablet by mouth daily.    . Nutritional Supplements (,FEEDING SUPPLEMENT, PROSOURCE PLUS) liquid Take 30 mLs by mouth 2 (two) times daily between meals.    . vitamin B-12 (CYANOCOBALAMIN) 1000 MCG tablet Take 1,000 mcg by mouth daily.    . vitamin C (VITAMIN C) 500 MG tablet Take 1 tablet (500 mg total) by mouth daily. 30 tablet 0   No current facility-administered medications for this visit.    Allergies Allergies  Allergen Reactions  . [employer]  . Shellfish Allergy Itching    Makes throat itch     Histories Past Medical History:  Diagnosis Date  . A-fib ([place])   . Allergies   . Arthritis   . Asthma   . Cardiomyopathy ([place])   . CHF (congestive heart failure) ([place])   . Chronic kidney disease   . Edema   . History of hemodialysis   . Hyperlipidemia   . Hypertension   . Pulmonary hypertension ([place])   . Sleep apnea   . Tricuspid regurgitation    Past Surgical History:  Procedure Laterality Date  . A/V FISTULAGRAM Left 05/23/2020   Procedure: A/V FISTULAGRAM;  Surgeon: [name], MD;  Location: [place] [facility];  Service: Cardiovascular;  Laterality: Left;  . A/V FISTULAGRAM N/A 12/21/2020   Procedure: A/V FISTULAGRAM - Left Upper Arm;  Surgeon: [name], MD;  Location: [place] [facility];  Service: Cardiovascular;  Laterality: N/A;  . ABDOMINAL HYSTERECTOMY    . AV FISTULA PLACEMENT Left 11/04/2019   Procedure: Insertion Of Arteriovenous (Av) Gore-Tex Graft Arm;  Surgeon: [name], MD;  Location: [place];  Service: Vascular;  Laterality: Left;  . BIOPSY  11/14/2020   Procedure: BIOPSY;  Surgeon: Yetta Flock, MD;  Location: [place];   Service: Gastroenterology;;  . BIOPSY  11/16/2020   Procedure: BIOPSY;  Surgeon: Yetta Flock, MD;  Location: [place];  Service: Gastroenterology;;  . CARDIAC CATHETERIZATION    . CARDIOVERSION N/A 10/26/2019   Procedure: CARDIOVERSION;  Surgeon: [name], MD;  Location[facility] ENDOSCOPY;  Service: Cardiovascular;  Laterality: N/A;  . COLONOSCOPY W/ BIOPSIES AND POLYPECTOMY    . COLONOSCOPY WITH PROPOFOL N/A 11/16/2020   Procedure: COLONOSCOPY WITH PROPOFOL;  Surgeon: Yetta Flock, MD;  Location: [place];  Service: Gastroenterology;  Laterality: N/A;  . ESOPHAGOGASTRODUODENOSCOPY (EGD) WITH PROPOFOL Left 11/14/2020   Procedure: ESOPHAGOGASTRODUODENOSCOPY (EGD) WITH PROPOFOL;  Surgeon: Yetta Flock, MD;  Location: [place];  Service: Gastroenterology;  Laterality: Left;  . FISTULA SUPERFICIALIZATION Left 01/21/2020   Procedure: FISTULA SUPERFICIALIZATION OF FISTULA;  Surgeon: [name], MD;  Location: [place];  Service: Vascular;  Laterality: Left;  . GIVENS CAPSULE STUDY N/A 11/16/2020   Procedure: GIVENS CAPSULE STUDY;  Surgeon: Yetta Flock, MD;  Location: [place];  Service: Gastroenterology;  Laterality: N/A;  . INSERTION OF  DIALYSIS CATHETER Right 11/04/2019   Procedure: CONVERT TEMPORARY DIALYSIS CATHETER TO TUNNELED DIALYSIS CATHETER Right Internal Jugular.;  Surgeon: [name], MD;  Location: [place];  Service: Vascular;  Laterality: Right;  . IR THORACENTESIS ASP PLEURAL SPACE W/IMG GUIDE  08/17/2020  . MULTIPLE TOOTH EXTRACTIONS    . POLYPECTOMY  11/16/2020   Procedure: POLYPECTOMY;  Surgeon: Yetta Flock, MD;  [facility] ENDOSCOPY;  Service: Gastroenterology;;  . REVISION OF ARTERIOVENOUS GORETEX GRAFT Left 01/21/2020   Procedure: REVISION OF ARTERIOVENOUS FISTULA WITH SIDE BRANCH LIGATION;  Surgeon: [name], MD;  Location: [place];  Service: Vascular;  Laterality: Left;  . TEE WITHOUT  CARDIOVERSION N/A 10/26/2019   Procedure: TRANSESOPHAGEAL ECHOCARDIOGRAM (TEE);  Surgeon: [name], MD;  [facility]COPY;  Service: Cardiovascular;  Laterality: N/A;  . THORACENTESIS     x 2  . THORACENTESIS N/A 11/07/2020   Procedure: Mathews Robinsons;  Surgeon: [name], MD;  Location: [facility] ENDOSCOPY;  Service: Pulmonary;  Laterality: N/A;   Social History   Socioeconomic History  . Marital status: Widowed    Spouse name: Not on file  . Number of children: Not on file  . Years of education: Not on file  . Highest education level: Not on file  Occupational History  . Not on file  Tobacco Use  . Smoking status: Former Smoker    Types: Cigarettes  . Smokeless tobacco: Never Used  Vaping Use  . Vaping Use: Never used  Substance and Sexual Activity  . Alcohol use: Not Currently  . Drug use: Not Currently  . Sexual activity: Not Currently  Other Topics Concern  . Not on file  Social History Narrative  . Not on file   Social Determinants of Health   Financial Resource Strain: Not on file  Food Insecurity: Not on file  Transportation Needs: No Transportation Needs  . Lack of Transportation (Medical): No  . Lack of Transportation (Non-Medical): No  Physical Activity: Not on file  Stress: Not on file  Social Connections: Socially Isolated  . Frequency of Communication with Friends and Family: More than three times a week  . Frequency of Social Gatherings with Friends and Family: Once a week  . Attends Religious Services: Never  . Active Member of Clubs or Organizations: No  . Attends Archivist Meetings: Never  . Marital Status: Widowed  Intimate Partner Violence: Not on file   Family History  Problem Relation Age of Onset  . Seizures Father   . CAD Father   . Diabetes Sister   . Lupus Sister   . Hypertension Sister   . Colon cancer Neg Hx   . Esophageal cancer Neg Hx   . Pancreatic cancer Neg Hx   . Stomach cancer Neg Hx   .  Inflammatory bowel disease Neg Hx   . Liver disease Neg Hx   . Rectal cancer Neg Hx    I have reviewed her medical, social, and family history in detail and updated the electronic medical record as necessary.    PHYSICAL EXAMINATION  BP (!) 100/58   Pulse 82   Ht '5\' 3"'  (1.6 m)   Wt 202 lb (91.6 kg)   SpO2 98%   BMI 35.78 kg/m  Wt Readings from Last 3 Encounters:  01/24/21 202 lb (91.6 kg)  01/02/21 199 lb 3.2 oz (90.4 kg)  12/21/20 208 lb (94.3 kg)  GEN: NAD, appears stated age, doesn't appear chronically ill PSYCH: Cooperative, without pressured  speech EYE: Conjunctivae pink, sclerae anicteric ENT: Masked CV: Nontachycardic RESP: No audible wheezing GI: NABS, soft, protuberant abdomen, rounded, nontender, without rebound  MSK/EXT: Lower extremity edema present SKIN: No jaundice NEURO:  Alert & Oriented x 3, no focal deficits, no evidence of asterixis   REVIEW OF DATA  I reviewed the following data at the time of this encounter:  GI Procedures and Studies  December 2021 EGD - Z-line regular. - Normal esophagus - no varices - A single gastric polyp. - Normal stomach - no varices - Benign nodule found in the duodenum. Biopsied. - Nodular mucosa in the duodenal bulb and in the second portion of the duodenum. No cause for GI bleeding / melena on upper endoscopy. Suspect the patient is having small bowel bleed vs. slow right sided colonic bleed. She became hypotensive at the end of this case, managed per anesthesia, procedure was terminated.  Pathology FINAL MICROSCOPIC DIAGNOSIS:  A. SMALL BOWEL, BIOPSY:  - Focal well differentiated neuroendocrine tumor, see comment.  COMMENT:  There is a small focus of well differentiated neuroendocrine tumor. The  cells are positive for CD56 and synaptophysin. The focus is small limiting assessment, but Ki-67 appears low (<3%).   Laboratory Studies  Reviewed those in epic  Imaging Studies  December 2021 CT abdomen pelvis with  contrast IMPRESSION: 1. No acute abdominopelvic abnormality. 2. Large, partially visualized right-sided pleural effusion with complete collapse of the right lower lobe. 3. Cholelithiasis without acute inflammation. 4. Indeterminate left adrenal nodule measuring approximately 1.8 cm. Given the patient's history, a follow-up outpatient adrenal protocol CT is recommended.  ASSESSMENT  Ms. Lyons is a 75 y.o. female with a pmh significant for A. fib (on anticoagulation CHF, ESRD on HD, pulmonary hypertension, hypertension, hyperlipidemia, OSA, cirrhosis, neuroendocrine tumor.  The patient is seen today for evaluation and management of:  1. Neuroendocrine tumor of small intestine   2. Abnormal findings on esophagogastroduodenoscopy (EGD)   3. Chronic anticoagulation    The patient is clinically and hemodynamically stable.  She has been followed up by her primary gastroenterologist who feels that from a cirrhosis standpoint she is clinically stable.  I agree with this assessment as well.  Based upon the description and endoscopic pictures I do feel that it is reasonable to pursue an Advanced Polypectomy attempt of the duodenal neuroendocrine tumor.  An endoscopic ultrasound will need to be performed prior to ensure that this is not a deeper neuroendocrine tumor.  We discussed some of the techniques of advanced polypectomy which include Endoscopic Mucosal Resection and OVESCO Full-Thickness Resection, Endorotor Morcellation, and Tissue Ablation via Fulguration.  We also reviewed images of typical techniques as noted above.  The risks and benefits of endoscopic evaluation were discussed with the patient; these include but are not limited to the risk of perforation, infection, bleeding, missed lesions, lack of diagnosis, severe illness requiring hospitalization, as well as anesthesia and sedation related illnesses.  During attempts at advanced resection, the risks of bleeding and perforation/leak are  increased as opposed to diagnostic and screening procedures, and that was discussed with the patient as well.   In addition, I explained that with the possible need for piecemeal resection, subsequent short-interval endoscopic evaluation for follow up and potential retreatment of the lesion/area may be necessary.  If, after attempt at removal of the polyp/lesion, it is found that the patient has a complication or that an invasive lesion or malignant lesion is found, or that the polyp/lesion continues to recur, the patient is  aware and understands that surgery may still be indicated/required.  The risks of an EUS including intestinal perforation, bleeding, infection, aspiration, and medication effects were discussed as was the possibility it may not give a definitive diagnosis if a biopsy is performed.  When a biopsy of the pancreas is done as part of the EUS, there is an additional risk of pancreatitis at the rate of about 1-2%.  It was explained that procedure related pancreatitis is typically mild, although it can be severe and even life threatening, which is why we do not perform random pancreatic biopsies and only biopsy a lesion/area we feel is concerning enough to warrant the risk though this is not anticipated based on her other imaging that has been performed.  We will get approval for holding of Eliquis for at least 2 days prior to procedure.  She will get preprocedure labs at dialysis in the coming week.  All patient questions were answered, to the best of my ability, and the patient agrees to the aforementioned plan of action with follow-up as indicated.   PLAN  Preprocedure labs to be obtained at upcoming dialysis session Proceed with scheduling EGD/EUS with EMR attempt of duodenal neuroendocrine tumor Follow-up to be dictated based on completion of procedure 48-hour Eliquis hold as per cardiology (we will obtain approval for this holding)   Orders Placed This Encounter  Procedures  .  Procedural/ Surgical Case Request: UPPER ESOPHAGEAL ENDOSCOPIC ULTRASOUND (EUS), ENDOSCOPIC MUCOSAL RESECTION  . CBC  . Basic Metabolic Panel (BMET)  . INR/PT  . Ambulatory referral to Gastroenterology    New Prescriptions   No medications on file   Modified Medications   No medications on file    Planned Follow Up No follow-ups on file.   Total Time in Face-to-Face and in Coordination of Care for patient including independent/personal interpretation/review of prior testing, medical history, examination, medication adjustment, communicating results with the patient directly, and documentation with the EHR is 25 minutes.   Justice Britain, MD [facility] Advanced Endoscopy Office # [phone]

## 2021-01-24 NOTE — Telephone Encounter (Signed)
   Primary Cardiologist: [name], MD  Chart reviewed as part of pre-operative protocol coverage. Given past medical history and time since last visit, based on ACC/AHA guidelines, Melissa Adams would be at acceptable risk for the planned procedure without further cardiovascular testing.   Per pharmacy:  Patient with diagnosis of afib on Eliquis for anticoagulation.    Procedure: EUS+EMR Date of procedure: 02/22/21  CHA2DS2-VASc Score = 4  This indicates a 4.8% annual risk of stroke. The patient's score is based upon: CHF History: Yes HTN History: Yes Diabetes History: No Stroke History: No Vascular Disease History: No Age Score: 1 Gender Score: 1     CrCl 9.4 ml/min (pt is ESRD on HD)  Per office protocol, patient can hold Eliquis for 2 days prior to procedure.    I will route this recommendation to the requesting party via Epic fax function and remove from pre-op pool.  Please call with questions.  Kathyrn Drown, NP 01/24/2021, 1:12 PM

## 2021-01-24 NOTE — H&P (View-Only) (Signed)
[place] VISIT   Primary Care Provider [name], NP [id number] [place] [address] [phone]  Referring Provider Dr. Bryan Lemma  Patient Profile: Melissa Adams is a 75 y.o. female with a pmh significant for A. fib (on anticoagulation CHF, ESRD on HD, pulmonary hypertension, hypertension, hyperlipidemia, OSA, cirrhosis, neuroendocrine tumor.  The patient presents to the [facility] for an evaluation and management of problem(s) noted below:  Problem List 1. Neuroendocrine tumor of small intestine   2. Abnormal findings on esophagogastroduodenoscopy (EGD)   3. Chronic anticoagulation     History of Present Illness Please see initial consultation and progress notes by Dr. Bryan Lemma for full details of HPI.  Interval History This is a patient who I actually met in the hospital in December due to issues of acute blood loss anemia.  Endoscopic evaluation was performed including an EGD/colonoscopy/video capsule endoscopy.  During her evaluation on endoscopy a duodenal nodule was found.  This was biopsied.  This returned as a duodenal carcinoid/neuroendocrine tumor.  The patient was eventually discharged.  She has followed up with Dr. Bryan Lemma and with cardiology.  She has been overall stable and felt to be a potential candidate for further intervention of this duodenal neuroendocrine tumor.  It is for this reason that patient returns today to clinic.  She is accompanied by her family.  The patient describes having episodes of darker stools at times but nothing to the point of the jet black as prior.  She continues to have fatigue but overall is stable.  She denies any overt hematemesis or coffee-ground emesis.  Diarrhea is not normal for her.  GI Review of Systems Positive as above Negative for pyrosis, dysphagia, pain, change in bowel habits  Review of Systems General: Denies fevers/chills/weight loss  unintentionally Cardiovascular: Denies chest pain/palpitations Pulmonary: Denies shortness of breath Gastroenterological: See HPI Hematological: Positive for easy bruising/bleeding due to anticoagulation  Dermatological: Denies jaundice Psychological: Mood is stable   Medications Current Outpatient Medications  Medication Sig Dispense Refill  . acetaminophen (TYLENOL) 325 MG tablet Take 2 tablets (650 mg total) by mouth every 4 (four) hours as needed for headache or mild pain.    Marland Kitchen albuterol (VENTOLIN HFA) 108 (90 Base) MCG/ACT inhaler Inhale 2 puffs into the lungs every 6 (six) hours as needed for wheezing or shortness of breath. 6.7 g 1  . allopurinol (ZYLOPRIM) 300 MG tablet TAKE 1 TABLET BY MOUTH AT BEDTIME (Patient taking differently: Take 300 mg by mouth daily.) 30 tablet 3  . apixaban (ELIQUIS) 5 MG TABS tablet Take 1 tablet (5 mg total) by mouth 2 (two) times daily. 60 tablet 3  . atorvastatin (LIPITOR) 80 MG tablet Take 1 tablet (80 mg total) by mouth at bedtime. 30 tablet 5  . cetirizine (ZYRTEC) 10 MG tablet Take 10 mg by mouth daily as needed for allergies.    . cinacalcet (SENSIPAR) 30 MG tablet Take 30 mg by mouth daily with supper.     . ferric citrate (AURYXIA) 1 GM 210 MG(Fe) tablet Take 210 mg by mouth 3 (three) times daily with meals.    . furosemide (LASIX) 40 MG tablet Take 80 mg by mouth See admin instructions. Take 2 tablets (80 mg) by mouth twice daily on non-dialysis days - Sunday, Tuesday, Thursday    . lidocaine-prilocaine (EMLA) cream Apply 1 application topically See admin instructions. Apply topically to port access one hour prior to dialysis - Monday, Wednesday, Friday, Saturday    .  metoprolol succinate (TOPROL-XL) 50 MG 24 hr tablet Take 0.5 tablets (25 mg total) by mouth in the morning and at bedtime. Take with or immediately following a meal. (Patient taking differently: Take 50 mg by mouth daily. Take with or immediately following a meal.) 60 tablet 3  .  midodrine (PROAMATINE) 10 MG tablet Take 1 tablet (10 mg total) by mouth daily. 30 tablet 0  . montelukast (SINGULAIR) 10 MG tablet Take 1 tablet by mouth once daily (Patient taking differently: Take 10 mg by mouth at bedtime.) 90 tablet 0  . multivitamin (RENA-VIT) TABS tablet Take 1 tablet by mouth daily.    . Nutritional Supplements (,FEEDING SUPPLEMENT, PROSOURCE PLUS) liquid Take 30 mLs by mouth 2 (two) times daily between meals.    . vitamin B-12 (CYANOCOBALAMIN) 1000 MCG tablet Take 1,000 mcg by mouth daily.    . vitamin C (VITAMIN C) 500 MG tablet Take 1 tablet (500 mg total) by mouth daily. 30 tablet 0   No current facility-administered medications for this visit.    Allergies Allergies  Allergen Reactions  . [employer]  . Shellfish Allergy Itching    Makes throat itch     Histories Past Medical History:  Diagnosis Date  . A-fib ([place])   . Allergies   . Arthritis   . Asthma   . Cardiomyopathy ([place])   . CHF (congestive heart failure) ([place])   . Chronic kidney disease   . Edema   . History of hemodialysis   . Hyperlipidemia   . Hypertension   . Pulmonary hypertension ([place])   . Sleep apnea   . Tricuspid regurgitation    Past Surgical History:  Procedure Laterality Date  . A/V FISTULAGRAM Left 05/23/2020   Procedure: A/V FISTULAGRAM;  Surgeon: [name], MD;  Location: [place] [facility];  Service: Cardiovascular;  Laterality: Left;  . A/V FISTULAGRAM N/A 12/21/2020   Procedure: A/V FISTULAGRAM - Left Upper Arm;  Surgeon: [name], MD;  Location: [place] [facility];  Service: Cardiovascular;  Laterality: N/A;  . ABDOMINAL HYSTERECTOMY    . AV FISTULA PLACEMENT Left 11/04/2019   Procedure: Insertion Of Arteriovenous (Av) [place] Graft Arm;  Surgeon: [name], MD;  Location: [place];  Service: Vascular;  Laterality: Left;  . BIOPSY  11/14/2020   Procedure: BIOPSY;  Surgeon: Yetta Flock, MD;  Location: [place];   Service: Gastroenterology;;  . BIOPSY  11/16/2020   Procedure: BIOPSY;  Surgeon: Yetta Flock, MD;  Location: [place];  Service: Gastroenterology;;  . CARDIAC CATHETERIZATION    . CARDIOVERSION N/A 10/26/2019   Procedure: CARDIOVERSION;  Surgeon: [name], MD;  Location: [facility];  Service: Cardiovascular;  Laterality: N/A;  . COLONOSCOPY W/ BIOPSIES AND POLYPECTOMY    . COLONOSCOPY WITH PROPOFOL N/A 11/16/2020   Procedure: COLONOSCOPY WITH PROPOFOL;  Surgeon: Yetta Flock, MD;  Location: [place];  Service: Gastroenterology;  Laterality: N/A;  . ESOPHAGOGASTRODUODENOSCOPY (EGD) WITH PROPOFOL Left 11/14/2020   Procedure: ESOPHAGOGASTRODUODENOSCOPY (EGD) WITH PROPOFOL;  Surgeon: Yetta Flock, MD;  Location: [place];  Service: Gastroenterology;  Laterality: Left;  . FISTULA SUPERFICIALIZATION Left 01/21/2020   Procedure: FISTULA SUPERFICIALIZATION OF FISTULA;  Surgeon: [name], MD;  Location: [place];  Service: Vascular;  Laterality: Left;  . GIVENS CAPSULE STUDY N/A 11/16/2020   Procedure: GIVENS CAPSULE STUDY;  Surgeon: Yetta Flock, MD;  Location: [place];  Service: Gastroenterology;  Laterality: N/A;  . INSERTION OF  DIALYSIS CATHETER Right 11/04/2019   Procedure: CONVERT TEMPORARY DIALYSIS CATHETER TO TUNNELED DIALYSIS CATHETER Right Internal Jugular.;  Surgeon: [name], MD;  Location: [place];  Service: Vascular;  Laterality: Right;  . IR THORACENTESIS ASP PLEURAL SPACE W/IMG GUIDE  08/17/2020  . MULTIPLE TOOTH EXTRACTIONS    . POLYPECTOMY  11/16/2020   Procedure: POLYPECTOMY;  Surgeon: Yetta Flock, MD;  Location: [facility] ENDOSCOPY;  Service: Gastroenterology;;  . REVISION OF ARTERIOVENOUS GORETEX GRAFT Left 01/21/2020   Procedure: REVISION OF ARTERIOVENOUS FISTULA WITH SIDE BRANCH LIGATION;  Surgeon: [name], MD;  Location: [place];  Service: Vascular;  Laterality: Left;  . TEE WITHOUT  CARDIOVERSION N/A 10/26/2019   Procedure: TRANSESOPHAGEAL ECHOCARDIOGRAM (TEE);  Surgeon: [name], MD;  Location: [facility] ENDOSCOPY;  Service: Cardiovascular;  Laterality: N/A;  . THORACENTESIS     x 2  . THORACENTESIS N/A 11/07/2020   Procedure: Mathews Robinsons;  Surgeon: [name], MD;  Location: [facility] ENDOSCOPY;  Service: Pulmonary;  Laterality: N/A;   Social History   Socioeconomic History  . Marital status: Widowed    Spouse name: Not on file  . Number of children: Not on file  . Years of education: Not on file  . Highest education level: Not on file  Occupational History  . Not on file  Tobacco Use  . Smoking status: Former Smoker    Types: Cigarettes  . Smokeless tobacco: Never Used  Vaping Use  . Vaping Use: Never used  Substance and Sexual Activity  . Alcohol use: Not Currently  . Drug use: Not Currently  . Sexual activity: Not Currently  Other Topics Concern  . Not on file  Social History Narrative  . Not on file   Social Determinants of Health   Financial Resource Strain: Not on file  Food Insecurity: Not on file  Transportation Needs: No Transportation Needs  . Lack of Transportation (Medical): No  . Lack of Transportation (Non-Medical): No  Physical Activity: Not on file  Stress: Not on file  Social Connections: Socially Isolated  . Frequency of Communication with Friends and Family: More than three times a week  . Frequency of Social Gatherings with Friends and Family: Once a week  . Attends Religious Services: Never  . Active Member of Clubs or Organizations: No  . Attends Archivist Meetings: Never  . Marital Status: Widowed  Intimate Partner Violence: Not on file   Family History  Problem Relation Age of Onset  . Seizures Father   . CAD Father   . Diabetes Sister   . Lupus Sister   . Hypertension Sister   . Colon cancer Neg Hx   . Esophageal cancer Neg Hx   . Pancreatic cancer Neg Hx   . Stomach cancer Neg Hx   .  Inflammatory bowel disease Neg Hx   . Liver disease Neg Hx   . Rectal cancer Neg Hx    I have reviewed her medical, social, and family history in detail and updated the electronic medical record as necessary.    PHYSICAL EXAMINATION  BP (!) 100/58   Pulse 82   Ht '5\' 3"'  (1.6 m)   Wt 202 lb (91.6 kg)   SpO2 98%   BMI 35.78 kg/m  Wt Readings from Last 3 Encounters:  01/24/21 202 lb (91.6 kg)  01/02/21 199 lb 3.2 oz (90.4 kg)  12/21/20 208 lb (94.3 kg)  GEN: NAD, appears stated age, doesn't appear chronically ill PSYCH: Cooperative, without pressured  speech EYE: Conjunctivae pink, sclerae anicteric ENT: Masked CV: Nontachycardic RESP: No audible wheezing GI: NABS, soft, protuberant abdomen, rounded, nontender, without rebound  MSK/EXT: Lower extremity edema present SKIN: No jaundice NEURO:  Alert & Oriented x 3, no focal deficits, no evidence of asterixis   REVIEW OF DATA  I reviewed the following data at the time of this encounter:  GI Procedures and Studies  December 2021 EGD - Z-line regular. - Normal esophagus - no varices - A single gastric polyp. - Normal stomach - no varices - Benign nodule found in the duodenum. Biopsied. - Nodular mucosa in the duodenal bulb and in the second portion of the duodenum. No cause for GI bleeding / melena on upper endoscopy. Suspect the patient is having small bowel bleed vs. slow right sided colonic bleed. She became hypotensive at the end of this case, managed per anesthesia, procedure was terminated.  Pathology FINAL MICROSCOPIC DIAGNOSIS:  A. SMALL BOWEL, BIOPSY:  - Focal well differentiated neuroendocrine tumor, see comment.  COMMENT:  There is a small focus of well differentiated neuroendocrine tumor. The  cells are positive for CD56 and synaptophysin. The focus is small limiting assessment, but Ki-67 appears low (<3%).   Laboratory Studies  Reviewed those in epic  Imaging Studies  December 2021 CT abdomen pelvis with  contrast IMPRESSION: 1. No acute abdominopelvic abnormality. 2. Large, partially visualized right-sided pleural effusion with complete collapse of the right lower lobe. 3. Cholelithiasis without acute inflammation. 4. Indeterminate left adrenal nodule measuring approximately 1.8 cm. Given the patient's history, a follow-up outpatient adrenal protocol CT is recommended.  ASSESSMENT  Ms. Habermehl is a 75 y.o. female with a pmh significant for A. fib (on anticoagulation CHF, ESRD on HD, pulmonary hypertension, hypertension, hyperlipidemia, OSA, cirrhosis, neuroendocrine tumor.  The patient is seen today for evaluation and management of:  1. Neuroendocrine tumor of small intestine   2. Abnormal findings on esophagogastroduodenoscopy (EGD)   3. Chronic anticoagulation    The patient is clinically and hemodynamically stable.  She has been followed up by her primary gastroenterologist who feels that from a cirrhosis standpoint she is clinically stable.  I agree with this assessment as well.  Based upon the description and endoscopic pictures I do feel that it is reasonable to pursue an Advanced Polypectomy attempt of the duodenal neuroendocrine tumor.  An endoscopic ultrasound will need to be performed prior to ensure that this is not a deeper neuroendocrine tumor.  We discussed some of the techniques of advanced polypectomy which include Endoscopic Mucosal Resection and OVESCO Full-Thickness Resection, Endorotor Morcellation, and Tissue Ablation via Fulguration.  We also reviewed images of typical techniques as noted above.  The risks and benefits of endoscopic evaluation were discussed with the patient; these include but are not limited to the risk of perforation, infection, bleeding, missed lesions, lack of diagnosis, severe illness requiring hospitalization, as well as anesthesia and sedation related illnesses.  During attempts at advanced resection, the risks of bleeding and perforation/leak are  increased as opposed to diagnostic and screening procedures, and that was discussed with the patient as well.   In addition, I explained that with the possible need for piecemeal resection, subsequent short-interval endoscopic evaluation for follow up and potential retreatment of the lesion/area may be necessary.  If, after attempt at removal of the polyp/lesion, it is found that the patient has a complication or that an invasive lesion or malignant lesion is found, or that the polyp/lesion continues to recur, the patient is  aware and understands that surgery may still be indicated/required.  The risks of an EUS including intestinal perforation, bleeding, infection, aspiration, and medication effects were discussed as was the possibility it may not give a definitive diagnosis if a biopsy is performed.  When a biopsy of the pancreas is done as part of the EUS, there is an additional risk of pancreatitis at the rate of about 1-2%.  It was explained that procedure related pancreatitis is typically mild, although it can be severe and even life threatening, which is why we do not perform random pancreatic biopsies and only biopsy a lesion/area we feel is concerning enough to warrant the risk though this is not anticipated based on her other imaging that has been performed.  We will get approval for holding of Eliquis for at least 2 days prior to procedure.  She will get preprocedure labs at dialysis in the coming week.  All patient questions were answered, to the best of my ability, and the patient agrees to the aforementioned plan of action with follow-up as indicated.   PLAN  Preprocedure labs to be obtained at upcoming dialysis session Proceed with scheduling EGD/EUS with EMR attempt of duodenal neuroendocrine tumor Follow-up to be dictated based on completion of procedure 48-hour Eliquis hold as per cardiology (we will obtain approval for this holding)   Orders Placed This Encounter  Procedures  .  Procedural/ Surgical Case Request: UPPER ESOPHAGEAL ENDOSCOPIC ULTRASOUND (EUS), ENDOSCOPIC MUCOSAL RESECTION  . CBC  . Basic Metabolic Panel (BMET)  . INR/PT  . Ambulatory referral to Gastroenterology    New Prescriptions   No medications on file   Modified Medications   No medications on file    Planned Follow Up No follow-ups on file.   Total Time in Face-to-Face and in Coordination of Care for patient including independent/personal interpretation/review of prior testing, medical history, examination, medication adjustment, communicating results with the patient directly, and documentation with the EHR is 25 minutes.   Justice Britain, MD [facility] Advanced Endoscopy Office # [phone]

## 2021-01-27 ENCOUNTER — Encounter: Payer: Self-pay | Admitting: Gastroenterology

## 2021-01-27 DIAGNOSIS — R198 Other specified symptoms and signs involving the digestive system and abdomen: Secondary | ICD-10-CM | POA: Insufficient documentation

## 2021-01-27 DIAGNOSIS — D3A8 Other benign neuroendocrine tumors: Secondary | ICD-10-CM | POA: Insufficient documentation

## 2021-01-29 NOTE — Telephone Encounter (Signed)
Left detailed message for pt to let her know per Cardiology Okay to hold Eliquis x2 days prior to procedure. I have asked pt to return call to let us know that she has received my message.

## 2021-02-13 ENCOUNTER — Other Ambulatory Visit: Payer: Self-pay

## 2021-02-13 ENCOUNTER — Ambulatory Visit (INDEPENDENT_AMBULATORY_CARE_PROVIDER_SITE_OTHER): Payer: Medicare HMO | Admitting: Physician Assistant

## 2021-02-13 ENCOUNTER — Encounter: Payer: Self-pay | Admitting: Physician Assistant

## 2021-02-13 VITALS — BP 100/58 | HR 97 | Temp 97.6°F | Resp 20 | Ht 63.0 in | Wt 199.0 lb

## 2021-02-13 DIAGNOSIS — N186 End stage renal disease: Secondary | ICD-10-CM | POA: Diagnosis not present

## 2021-02-13 NOTE — Progress Notes (Signed)
[facility] [place] HISTORY AND PHYSICAL   History of Present Illness:  Patient is a 75 y.o. year old female who presents  with left UE ulcer over her fistula.  She has had problems with the fistula and prolonged bleeding in the past.  Fistulograms x 2 in the past showed a patent fistula.  Most recently had a fistulaogram to r/o central venous stenosis.  The fistula was patent without intervention on 12/21/20.  She states the the upper stick site and the ulcer area both have had episodes of prolonged bleeding.  She is on Eliquis and does not use Heparin during HD.  He HD is on MWF.  She denise symptoms of steal and no edema.     Past Medical History:  Diagnosis Date  . A-fib ([place])   . Allergies   . Arthritis   . Asthma   . Cardiomyopathy ([place])   . CHF (congestive heart failure) ([place])   . Chronic kidney disease   . Edema   . History of hemodialysis   . Hyperlipidemia   . Hypertension   . Pulmonary hypertension ([place])   . Sleep apnea   . Tricuspid regurgitation     Past Surgical History:  Procedure Laterality Date  . A/V FISTULAGRAM Left 05/23/2020   Procedure: A/V FISTULAGRAM;  Surgeon: [name], MD;  Location: [place] [facility];  Service: Cardiovascular;  Laterality: Left;  . A/V FISTULAGRAM N/A 12/21/2020   Procedure: A/V FISTULAGRAM - Left Upper Arm;  Surgeon: [name], MD;  Location: [place] [facility];  Service: Cardiovascular;  Laterality: N/A;  . ABDOMINAL HYSTERECTOMY    . AV FISTULA PLACEMENT Left 11/04/2019   Procedure: Insertion Of Arteriovenous (Av) Gore-Tex Graft Arm;  Surgeon: [name], MD;  Location: [place];  Service: Vascular;  Laterality: Left;  . BIOPSY  11/14/2020   Procedure: BIOPSY;  Surgeon: Yetta Flock, MD;  Location: [place];  Service: Gastroenterology;;  . BIOPSY  11/16/2020   Procedure: BIOPSY;  Surgeon: Yetta Flock, MD;  Location: [place];  Service: Gastroenterology;;  .  CARDIAC CATHETERIZATION    . CARDIOVERSION N/A 10/26/2019   Procedure: CARDIOVERSION;  Surgeon: [name], MD;  Location: [facility] ENDOSCOPY;  Service: Cardiovascular;  Laterality: N/A;  . COLONOSCOPY W/ BIOPSIES AND POLYPECTOMY    . COLONOSCOPY WITH PROPOFOL N/A 11/16/2020   Procedure: COLONOSCOPY WITH PROPOFOL;  Surgeon: Yetta Flock, MD;  Location: [place];  Service: Gastroenterology;  Laterality: N/A;  . ESOPHAGOGASTRODUODENOSCOPY (EGD) WITH PROPOFOL Left 11/14/2020   Procedure: ESOPHAGOGASTRODUODENOSCOPY (EGD) WITH PROPOFOL;  Surgeon: Yetta Flock, MD;  Location: [place];  Service: Gastroenterology;  Laterality: Left;  . FISTULA SUPERFICIALIZATION Left 01/21/2020   Procedure: FISTULA SUPERFICIALIZATION OF FISTULA;  Surgeon: [name], MD;  Location: [place];  Service: Vascular;  Laterality: Left;  . GIVENS CAPSULE STUDY N/A 11/16/2020   Procedure: GIVENS CAPSULE STUDY;  Surgeon: Yetta Flock, MD;  Location: [place];  Service: Gastroenterology;  Laterality: N/A;  . INSERTION OF DIALYSIS CATHETER Right 11/04/2019   Procedure: CONVERT TEMPORARY DIALYSIS CATHETER TO TUNNELED DIALYSIS CATHETER Right Internal Jugular.;  Surgeon: [name], MD;  Location: [place];  Service: Vascular;  Laterality: Right;  . IR THORACENTESIS ASP PLEURAL SPACE W/IMG GUIDE  08/17/2020  . MULTIPLE TOOTH EXTRACTIONS    . POLYPECTOMY  11/16/2020   Procedure: POLYPECTOMY;  Surgeon: Yetta Flock, MD;  Location: [place];  Service: Gastroenterology;;  . REVISION  OF ARTERIOVENOUS GORETEX GRAFT Left 01/21/2020   Procedure: REVISION OF ARTERIOVENOUS FISTULA WITH SIDE BRANCH LIGATION;  Surgeon: [name], MD;  Location: [place];  Service: Vascular;  Laterality: Left;  . TEE WITHOUT CARDIOVERSION N/A 10/26/2019   Procedure: TRANSESOPHAGEAL ECHOCARDIOGRAM (TEE);  Surgeon: [name], MD;  Location: [facility] ENDOSCOPY;  Service: Cardiovascular;  Laterality:  N/A;  . THORACENTESIS     x 2  . THORACENTESIS N/A 11/07/2020   Procedure: Mathews Robinsons;  Surgeon: [name], MD;  Location: [facility] ENDOSCOPY;  Service: Pulmonary;  Laterality: N/A;     Social History Social History   Tobacco Use  . Smoking status: Former Smoker    Types: Cigarettes  . Smokeless tobacco: Never Used  Vaping Use  . Vaping Use: Never used  Substance Use Topics  . Alcohol use: Not Currently  . Drug use: Not Currently    Family History Family History  Problem Relation Age of Onset  . Seizures Father   . CAD Father   . Diabetes Sister   . Lupus Sister   . Hypertension Sister   . Colon cancer Neg Hx   . Esophageal cancer Neg Hx   . Pancreatic cancer Neg Hx   . Stomach cancer Neg Hx   . Inflammatory bowel disease Neg Hx   . Liver disease Neg Hx   . Rectal cancer Neg Hx     Allergies  Allergies  Allergen Reactions  . [employer]  . Shellfish Allergy Itching    Makes throat itch      Current Outpatient Medications  Medication Sig Dispense Refill  . acetaminophen (TYLENOL) 325 MG tablet Take 2 tablets (650 mg total) by mouth every 4 (four) hours as needed for headache or mild pain.    Marland Kitchen albuterol (VENTOLIN HFA) 108 (90 Base) MCG/ACT inhaler Inhale 2 puffs into the lungs every 6 (six) hours as needed for wheezing or shortness of breath. 6.7 g 1  . allopurinol (ZYLOPRIM) 300 MG tablet TAKE 1 TABLET BY MOUTH AT BEDTIME (Patient taking differently: Take 300 mg by mouth daily.) 30 tablet 3  . apixaban (ELIQUIS) 5 MG TABS tablet Take 1 tablet (5 mg total) by mouth 2 (two) times daily. 60 tablet 3  . atorvastatin (LIPITOR) 80 MG tablet Take 1 tablet (80 mg total) by mouth at bedtime. 30 tablet 5  . cetirizine (ZYRTEC) 10 MG tablet Take 10 mg by mouth daily as needed for allergies.    . cinacalcet (SENSIPAR) 30 MG tablet Take 30 mg by mouth daily with supper.     . ferric citrate (AURYXIA) 1 GM 210 MG(Fe) tablet Take 210 mg by mouth 3  (three) times daily with meals.    . furosemide (LASIX) 40 MG tablet Take 80 mg by mouth See admin instructions. Take 2 tablets (80 mg) by mouth twice daily on non-dialysis days - Sunday, Tuesday, Thursday    . lidocaine-prilocaine (EMLA) cream Apply 1 application topically See admin instructions. Apply topically to port access one hour prior to dialysis - Monday, Wednesday, Friday, Saturday    . metoprolol succinate (TOPROL-XL) 50 MG 24 hr tablet Take 0.5 tablets (25 mg total) by mouth in the morning and at bedtime. Take with or immediately following a meal. (Patient taking differently: Take 50 mg by mouth daily. Take with or immediately following a meal.) 60 tablet 3  . midodrine (PROAMATINE) 10 MG tablet Take 1 tablet (10 mg total) by mouth daily. 30 tablet 0  .  montelukast (SINGULAIR) 10 MG tablet Take 1 tablet by mouth once daily (Patient taking differently: Take 10 mg by mouth at bedtime.) 90 tablet 0  . multivitamin (RENA-VIT) TABS tablet Take 1 tablet by mouth daily.    . Nutritional Supplements (,FEEDING SUPPLEMENT, PROSOURCE PLUS) liquid Take 30 mLs by mouth 2 (two) times daily between meals.    . vitamin B-12 (CYANOCOBALAMIN) 1000 MCG tablet Take 1,000 mcg by mouth daily.    . vitamin C (VITAMIN C) 500 MG tablet Take 1 tablet (500 mg total) by mouth daily. 30 tablet 0  . B Complex-C-Zn-Folic Acid (DIALYVITE 888-BVQX 15) 0.8 MG TABS Take 1 tablet by mouth daily.     No current facility-administered medications for this visit.    ROS:   General:  No weight loss, Fever, chills  HEENT: No recent headaches, no nasal bleeding, no visual changes, no sore throat  Neurologic: No dizziness, blackouts, seizures. No recent symptoms of stroke or mini- stroke. No recent episodes of slurred speech, or temporary blindness.  Cardiac: No recent episodes of chest pain/pressure, no shortness of breath at rest.  No shortness of breath with exertion.  positive history of atrial fibrillation or irregular  heartbeat  Vascular: No history of rest pain in feet.  No history of claudication.  No history of non-healing ulcer, No history of DVT   Pulmonary: No home oxygen, no productive cough, no hemoptysis,  No asthma or wheezing  Musculoskeletal:  [ ]  Arthritis, [ ]  Low back pain,  [ ]  Joint pain  Hematologic:No history of hypercoagulable state.  No history of easy bleeding.  No history of anemia  Gastrointestinal: No hematochezia or melena,  No gastroesophageal reflux, no trouble swallowing  Urinary: [ ]  chronic Kidney disease, [x ] on HD - [ ]  MWF or [ ]  TTHS, [ ]  Burning with urination, [ ]  Frequent urination, [ ]  Difficulty urinating;   Skin: No rashes  Psychological: No history of anxiety,  No history of depression   Physical Examination  Vitals:   02/13/21 1029  BP: (!) 100/58  Pulse: 97  Resp: 20  Temp: 97.6 F (36.4 C)  TempSrc: Temporal  SpO2: 96%  Weight: 199 lb (90.3 kg)  Height: 5\' 3"  (1.6 m)    Body mass index is 35.25 kg/m.  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  I have recommended plication and possible TDC.  The patient agrees with this plan of care.  She is on Eliquis for Afib and HD MWF.  We will hold her and try and schedule her for surgery on TT.     Roxy Horseman PA-C [facility] [place] Office: [phone]  MD in clinic [place]

## 2021-02-13 NOTE — H&P (View-Only) (Signed)
[facility] [place] HISTORY AND PHYSICAL   History of Present Illness:  Patient is a 75 y.o. year old female who presents  with left UE ulcer over her fistula.  She has had problems with the fistula and prolonged bleeding in the past.  Fistulograms x 2 in the past showed a patent fistula.  Most recently had a fistulaogram to r/o central venous stenosis.  The fistula was patent without intervention on 12/21/20.  She states the the upper stick site and the ulcer area both have had episodes of prolonged bleeding.  She is on Eliquis and does not use Heparin during HD.  He HD is on MWF.  She denise symptoms of steal and no edema.     Past Medical History:  Diagnosis Date  . A-fib ([place])   . Allergies   . Arthritis   . Asthma   . Cardiomyopathy ([place])   . CHF (congestive heart failure) ([place])   . Chronic kidney disease   . Edema   . History of hemodialysis   . Hyperlipidemia   . Hypertension   . Pulmonary hypertension ([place])   . Sleep apnea   . Tricuspid regurgitation     Past Surgical History:  Procedure Laterality Date  . A/V FISTULAGRAM Left 05/23/2020   Procedure: A/V FISTULAGRAM;  Surgeon: [name], MD;  Location: [place] [facility];  Service: Cardiovascular;  Laterality: Left;  . A/V FISTULAGRAM N/A 12/21/2020   Procedure: A/V FISTULAGRAM - Left Upper Arm;  Surgeon: [name], MD;  Location: [place] [facility];  Service: Cardiovascular;  Laterality: N/A;  . ABDOMINAL HYSTERECTOMY    . AV FISTULA PLACEMENT Left 11/04/2019   Procedure: Insertion Of Arteriovenous (Av) Gore-Tex Graft Arm;  Surgeon: [name], MD;  Location: [place];  Service: Vascular;  Laterality: Left;  . BIOPSY  11/14/2020   Procedure: BIOPSY;  Surgeon: Yetta Flock, MD;  Location: [place];  Service: Gastroenterology;;  . BIOPSY  11/16/2020   Procedure: BIOPSY;  Surgeon: Yetta Flock, MD;  Location: [place];  Service: Gastroenterology;;  .  CARDIAC CATHETERIZATION    . CARDIOVERSION N/A 10/26/2019   Procedure: CARDIOVERSION;  Surgeon: [name], MD;  Location: [facility] ENDOSCOPY;  Service: Cardiovascular;  Laterality: N/A;  . COLONOSCOPY W/ BIOPSIES AND POLYPECTOMY    . COLONOSCOPY WITH PROPOFOL N/A 11/16/2020   Procedure: COLONOSCOPY WITH PROPOFOL;  Surgeon: Yetta Flock, MD;  Location: [place];  Service: Gastroenterology;  Laterality: N/A;  . ESOPHAGOGASTRODUODENOSCOPY (EGD) WITH PROPOFOL Left 11/14/2020   Procedure: ESOPHAGOGASTRODUODENOSCOPY (EGD) WITH PROPOFOL;  Surgeon: Yetta Flock, MD;  Location: [place];  Service: Gastroenterology;  Laterality: Left;  . FISTULA SUPERFICIALIZATION Left 01/21/2020   Procedure: FISTULA SUPERFICIALIZATION OF FISTULA;  Surgeon: [name], MD;  Location: [place];  Service: Vascular;  Laterality: Left;  . GIVENS CAPSULE STUDY N/A 11/16/2020   Procedure: GIVENS CAPSULE STUDY;  Surgeon: Yetta Flock, MD;  Location: [place];  Service: Gastroenterology;  Laterality: N/A;  . INSERTION OF DIALYSIS CATHETER Right 11/04/2019   Procedure: CONVERT TEMPORARY DIALYSIS CATHETER TO TUNNELED DIALYSIS CATHETER Right Internal Jugular.;  Surgeon: [name], MD;  Location: [place];  Service: Vascular;  Laterality: Right;  . IR THORACENTESIS ASP PLEURAL SPACE W/IMG GUIDE  08/17/2020  . MULTIPLE TOOTH EXTRACTIONS    . POLYPECTOMY  11/16/2020   Procedure: POLYPECTOMY;  Surgeon: Yetta Flock, MD;  Location: [place];  Service: Gastroenterology;;  . REVISION  OF ARTERIOVENOUS GORETEX GRAFT Left 01/21/2020   Procedure: REVISION OF ARTERIOVENOUS FISTULA WITH SIDE BRANCH LIGATION;  Surgeon: [name], MD;  Location: [place];  Service: Vascular;  Laterality: Left;  . TEE WITHOUT CARDIOVERSION N/A 10/26/2019   Procedure: TRANSESOPHAGEAL ECHOCARDIOGRAM (TEE);  Surgeon: [name], MD;  Location: [facility] ENDOSCOPY;  Service: Cardiovascular;  Laterality:  N/A;  . THORACENTESIS     x 2  . THORACENTESIS N/A 11/07/2020   Procedure: Mathews Robinsons;  Surgeon: [name], MD;  Location: [facility] ENDOSCOPY;  Service: Pulmonary;  Laterality: N/A;     Social History Social History   Tobacco Use  . Smoking status: Former Smoker    Types: Cigarettes  . Smokeless tobacco: Never Used  Vaping Use  . Vaping Use: Never used  Substance Use Topics  . Alcohol use: Not Currently  . Drug use: Not Currently    Family History Family History  Problem Relation Age of Onset  . Seizures Father   . CAD Father   . Diabetes Sister   . Lupus Sister   . Hypertension Sister   . Colon cancer Neg Hx   . Esophageal cancer Neg Hx   . Pancreatic cancer Neg Hx   . Stomach cancer Neg Hx   . Inflammatory bowel disease Neg Hx   . Liver disease Neg Hx   . Rectal cancer Neg Hx     Allergies  Allergies  Allergen Reactions  . [employer]  . Shellfish Allergy Itching    Makes throat itch      Current Outpatient Medications  Medication Sig Dispense Refill  . acetaminophen (TYLENOL) 325 MG tablet Take 2 tablets (650 mg total) by mouth every 4 (four) hours as needed for headache or mild pain.    Marland Kitchen albuterol (VENTOLIN HFA) 108 (90 Base) MCG/ACT inhaler Inhale 2 puffs into the lungs every 6 (six) hours as needed for wheezing or shortness of breath. 6.7 g 1  . allopurinol (ZYLOPRIM) 300 MG tablet TAKE 1 TABLET BY MOUTH AT BEDTIME (Patient taking differently: Take 300 mg by mouth daily.) 30 tablet 3  . apixaban (ELIQUIS) 5 MG TABS tablet Take 1 tablet (5 mg total) by mouth 2 (two) times daily. 60 tablet 3  . atorvastatin (LIPITOR) 80 MG tablet Take 1 tablet (80 mg total) by mouth at bedtime. 30 tablet 5  . cetirizine (ZYRTEC) 10 MG tablet Take 10 mg by mouth daily as needed for allergies.    . cinacalcet (SENSIPAR) 30 MG tablet Take 30 mg by mouth daily with supper.     . ferric citrate (AURYXIA) 1 GM 210 MG(Fe) tablet Take 210 mg by mouth 3  (three) times daily with meals.    . furosemide (LASIX) 40 MG tablet Take 80 mg by mouth See admin instructions. Take 2 tablets (80 mg) by mouth twice daily on non-dialysis days - Sunday, Tuesday, Thursday    . lidocaine-prilocaine (EMLA) cream Apply 1 application topically See admin instructions. Apply topically to port access one hour prior to dialysis - Monday, Wednesday, Friday, Saturday    . metoprolol succinate (TOPROL-XL) 50 MG 24 hr tablet Take 0.5 tablets (25 mg total) by mouth in the morning and at bedtime. Take with or immediately following a meal. (Patient taking differently: Take 50 mg by mouth daily. Take with or immediately following a meal.) 60 tablet 3  . midodrine (PROAMATINE) 10 MG tablet Take 1 tablet (10 mg total) by mouth daily. 30 tablet 0  .  montelukast (SINGULAIR) 10 MG tablet Take 1 tablet by mouth once daily (Patient taking differently: Take 10 mg by mouth at bedtime.) 90 tablet 0  . multivitamin (RENA-VIT) TABS tablet Take 1 tablet by mouth daily.    . Nutritional Supplements (,FEEDING SUPPLEMENT, PROSOURCE PLUS) liquid Take 30 mLs by mouth 2 (two) times daily between meals.    . vitamin B-12 (CYANOCOBALAMIN) 1000 MCG tablet Take 1,000 mcg by mouth daily.    . vitamin C (VITAMIN C) 500 MG tablet Take 1 tablet (500 mg total) by mouth daily. 30 tablet 0  . B Complex-C-Zn-Folic Acid (DIALYVITE 109-NATF 15) 0.8 MG TABS Take 1 tablet by mouth daily.     No current facility-administered medications for this visit.    ROS:   General:  No weight loss, Fever, chills  HEENT: No recent headaches, no nasal bleeding, no visual changes, no sore throat  Neurologic: No dizziness, blackouts, seizures. No recent symptoms of stroke or mini- stroke. No recent episodes of slurred speech, or temporary blindness.  Cardiac: No recent episodes of chest pain/pressure, no shortness of breath at rest.  No shortness of breath with exertion.  positive history of atrial fibrillation or irregular  heartbeat  Vascular: No history of rest pain in feet.  No history of claudication.  No history of non-healing ulcer, No history of DVT   Pulmonary: No home oxygen, no productive cough, no hemoptysis,  No asthma or wheezing  Musculoskeletal:  [ ]  Arthritis, [ ]  Low back pain,  [ ]  Joint pain  Hematologic:No history of hypercoagulable state.  No history of easy bleeding.  No history of anemia  Gastrointestinal: No hematochezia or melena,  No gastroesophageal reflux, no trouble swallowing  Urinary: [ ]  chronic Kidney disease, [x ] on HD - [ ]  MWF or [ ]  TTHS, [ ]  Burning with urination, [ ]  Frequent urination, [ ]  Difficulty urinating;   Skin: No rashes  Psychological: No history of anxiety,  No history of depression   Physical Examination  Vitals:   02/13/21 1029  BP: (!) 100/58  Pulse: 97  Resp: 20  Temp: 97.6 F (36.4 C)  TempSrc: Temporal  SpO2: 96%  Weight: 199 lb (90.3 kg)  Height: 5\' 3"  (1.6 m)    Body mass index is 35.25 kg/m.  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  I have recommended plication and possible TDC.  The patient agrees with this plan of care.  She is on Eliquis for Afib and HD MWF.  We will hold her and try and schedule her for surgery on TT.     Roxy Horseman PA-C [facility] [place] Office: [phone]  MD in clinic [place]

## 2021-02-15 ENCOUNTER — Other Ambulatory Visit: Payer: Self-pay

## 2021-02-19 ENCOUNTER — Telehealth: Payer: Self-pay | Admitting: Gastroenterology

## 2021-02-19 NOTE — Telephone Encounter (Signed)
Pt's daughter is requesting a call back from a nurse to discuss if the CMA was able to receive the lab orders from dialysis.

## 2021-02-19 NOTE — Telephone Encounter (Signed)
Pt's daug- Precious Bard has been informed okay for pt to hold Eliquis x2 days prior to procedure. Voiced understanding.

## 2021-02-19 NOTE — Telephone Encounter (Signed)
Labs have been received and placed in Dr. Rush Landmark inbox in his office. Pt's daug-Patti has been informed.

## 2021-02-20 ENCOUNTER — Other Ambulatory Visit (HOSPITAL_COMMUNITY)
Admission: RE | Admit: 2021-02-20 | Discharge: 2021-02-20 | Disposition: A | Discharge status: Home / Self Care | Payer: Medicare HMO | Source: Ambulatory Visit | Attending: Gastroenterology | Admitting: Gastroenterology

## 2021-02-20 DIAGNOSIS — Z01812 Encounter for preprocedural laboratory examination: Secondary | ICD-10-CM | POA: Insufficient documentation

## 2021-02-20 DIAGNOSIS — Z20822 Contact with and (suspected) exposure to covid-19: Secondary | ICD-10-CM | POA: Diagnosis not present

## 2021-02-20 LAB — SARS CORONAVIRUS 2 (TAT 6-24 HRS): SARS Coronavirus 2: NEGATIVE

## 2021-02-21 ENCOUNTER — Encounter (HOSPITAL_COMMUNITY): Payer: Self-pay | Admitting: Gastroenterology

## 2021-02-21 ENCOUNTER — Other Ambulatory Visit: Payer: Self-pay

## 2021-02-21 NOTE — Progress Notes (Signed)
PCP - [name], NP Cardiologist Aundra Dubin, MD   Chest x-ray - 11/18/20 EKG - 11/27/20 Stress Test -  ECHO - 11/17/20 Cardiac Cath - 08/21/20  Sleep Study - yes no  CPAP - no - 2L [place] O2 at home only for sleep   Blood Thinner Instructions: apixaban (ELIQUIS) last dose per pt 3.29.22  Aspirin Instructions:   ERAS Protcol -   COVID TEST- 02/20/21  Anesthesia review:   -------------  SDW INSTRUCTIONS:  Your procedure is scheduled on 02/22/21. Please report to [facility] Main Entrance "A" at 11:00 A.M., and check in at the Admitting office. Call this number if you have problems the morning of surgery: [phone]   Remember: Do not eat or drink after midnight the night before your surgery   Medications to take morning of surgery with a sip of water include: acetaminophen (TYLENOL) if needed albuterol (VENTOLIN HFA)  allopurinol (ZYLOPRIM)  cetirizine (ZYRTEC) metoprolol succinate (TOPROL-XL)   As of today, STOP taking any Aspirin (unless otherwise instructed by your surgeon), Aleve, Naproxen, Ibuprofen, Motrin, Advil, Goody's, BC's, all herbal medications, fish oil, and all vitamins.    The Morning of Surgery Do not wear jewelry, make-up or nail polish. Do not wear lotions, powders, or perfumes, or deodorant Do not shave 48 hours prior to surgery.   Do not bring valuables to the hospital. [facility] is not responsible for any belongings or valuables. If you are a smoker, DO NOT Smoke 24 hours prior to surgery If you wear a CPAP at night please bring your mask the morning of surgery  Remember that you must have someone to transport you home after your surgery, and remain with you for 24 hours if you are discharged the same day. Please bring cases for contacts, glasses, hearing aids, dentures or bridgework because it cannot be worn into surgery.   Patients discharged the day of surgery will not be allowed to drive home.   Please shower the NIGHT BEFORE SURGERY and  the MORNING OF SURGERY with DIAL Soap. Wear comfortable clothes the morning of surgery. Oral Hygiene is also important to reduce your risk of infection.  Remember - BRUSH YOUR TEETH THE MORNING OF SURGERY WITH YOUR REGULAR TOOTHPASTE  Patient denies shortness of breath, fever, cough and chest pain.

## 2021-02-22 ENCOUNTER — Ambulatory Visit (HOSPITAL_COMMUNITY): Payer: Medicare HMO | Admitting: Certified Registered"

## 2021-02-22 ENCOUNTER — Ambulatory Visit (HOSPITAL_COMMUNITY)
Admission: RE | Admit: 2021-02-22 | Discharge: 2021-02-22 | Disposition: A | Discharge status: Home / Self Care | Payer: Medicare HMO | Attending: Gastroenterology | Admitting: Gastroenterology

## 2021-02-22 ENCOUNTER — Encounter: Payer: Self-pay | Admitting: Gastroenterology

## 2021-02-22 ENCOUNTER — Encounter (HOSPITAL_COMMUNITY): Payer: Self-pay | Admitting: Gastroenterology

## 2021-02-22 ENCOUNTER — Other Ambulatory Visit: Payer: Self-pay

## 2021-02-22 ENCOUNTER — Encounter (HOSPITAL_COMMUNITY)
Admission: RE | Disposition: A | Discharge status: Home / Self Care | Payer: Self-pay | Source: Home / Self Care | Attending: Gastroenterology

## 2021-02-22 DIAGNOSIS — N186 End stage renal disease: Secondary | ICD-10-CM | POA: Insufficient documentation

## 2021-02-22 DIAGNOSIS — I132 Hypertensive heart and chronic kidney disease with heart failure and with stage 5 chronic kidney disease, or end stage renal disease: Secondary | ICD-10-CM | POA: Insufficient documentation

## 2021-02-22 DIAGNOSIS — Z95828 Presence of other vascular implants and grafts: Secondary | ICD-10-CM | POA: Diagnosis not present

## 2021-02-22 DIAGNOSIS — K2980 Duodenitis without bleeding: Secondary | ICD-10-CM | POA: Diagnosis not present

## 2021-02-22 DIAGNOSIS — Z833 Family history of diabetes mellitus: Secondary | ICD-10-CM | POA: Diagnosis not present

## 2021-02-22 DIAGNOSIS — Z8249 Family history of ischemic heart disease and other diseases of the circulatory system: Secondary | ICD-10-CM | POA: Insufficient documentation

## 2021-02-22 DIAGNOSIS — I272 Pulmonary hypertension, unspecified: Secondary | ICD-10-CM | POA: Insufficient documentation

## 2021-02-22 DIAGNOSIS — Z90710 Acquired absence of both cervix and uterus: Secondary | ICD-10-CM | POA: Diagnosis not present

## 2021-02-22 DIAGNOSIS — I509 Heart failure, unspecified: Secondary | ICD-10-CM | POA: Diagnosis not present

## 2021-02-22 DIAGNOSIS — Z7901 Long term (current) use of anticoagulants: Secondary | ICD-10-CM | POA: Insufficient documentation

## 2021-02-22 DIAGNOSIS — R198 Other specified symptoms and signs involving the digestive system and abdomen: Secondary | ICD-10-CM

## 2021-02-22 DIAGNOSIS — K3189 Other diseases of stomach and duodenum: Secondary | ICD-10-CM

## 2021-02-22 DIAGNOSIS — K269 Duodenal ulcer, unspecified as acute or chronic, without hemorrhage or perforation: Secondary | ICD-10-CM

## 2021-02-22 DIAGNOSIS — D3A8 Other benign neuroendocrine tumors: Secondary | ICD-10-CM | POA: Diagnosis not present

## 2021-02-22 DIAGNOSIS — E785 Hyperlipidemia, unspecified: Secondary | ICD-10-CM | POA: Insufficient documentation

## 2021-02-22 DIAGNOSIS — Z79899 Other long term (current) drug therapy: Secondary | ICD-10-CM | POA: Diagnosis not present

## 2021-02-22 DIAGNOSIS — I4891 Unspecified atrial fibrillation: Secondary | ICD-10-CM | POA: Diagnosis not present

## 2021-02-22 DIAGNOSIS — Z992 Dependence on renal dialysis: Secondary | ICD-10-CM | POA: Diagnosis not present

## 2021-02-22 DIAGNOSIS — G4733 Obstructive sleep apnea (adult) (pediatric): Secondary | ICD-10-CM | POA: Insufficient documentation

## 2021-02-22 DIAGNOSIS — K449 Diaphragmatic hernia without obstruction or gangrene: Secondary | ICD-10-CM | POA: Insufficient documentation

## 2021-02-22 DIAGNOSIS — K2970 Gastritis, unspecified, without bleeding: Secondary | ICD-10-CM | POA: Diagnosis not present

## 2021-02-22 DIAGNOSIS — K2289 Other specified disease of esophagus: Secondary | ICD-10-CM | POA: Diagnosis not present

## 2021-02-22 DIAGNOSIS — K7460 Unspecified cirrhosis of liver: Secondary | ICD-10-CM | POA: Insufficient documentation

## 2021-02-22 DIAGNOSIS — Z87891 Personal history of nicotine dependence: Secondary | ICD-10-CM | POA: Insufficient documentation

## 2021-02-22 HISTORY — PX: SUBMUCOSAL TATTOO INJECTION: SHX6856

## 2021-02-22 HISTORY — PX: SUBMUCOSAL LIFTING INJECTION: SHX6855

## 2021-02-22 HISTORY — PX: HEMOSTASIS CLIP PLACEMENT: SHX6857

## 2021-02-22 HISTORY — PX: ENDOSCOPIC MUCOSAL RESECTION: SHX6839

## 2021-02-22 HISTORY — PX: UPPER ESOPHAGEAL ENDOSCOPIC ULTRASOUND (EUS): SHX6562

## 2021-02-22 HISTORY — PX: BIOPSY: SHX5522

## 2021-02-22 HISTORY — PX: ESOPHAGOGASTRODUODENOSCOPY (EGD) WITH PROPOFOL: SHX5813

## 2021-02-22 LAB — POCT I-STAT, CHEM 8
BUN: 34 mg/dL — ABNORMAL HIGH (ref 8–23)
Calcium, Ion: 1.14 mmol/L — ABNORMAL LOW (ref 1.15–1.40)
Chloride: 99 mmol/L (ref 98–111)
Creatinine, Ser: 6 mg/dL — ABNORMAL HIGH (ref 0.44–1.00)
Glucose, Bld: 80 mg/dL (ref 70–99)
HCT: 34 % — ABNORMAL LOW (ref 36.0–46.0)
Hemoglobin: 11.6 g/dL — ABNORMAL LOW (ref 12.0–15.0)
Potassium: 3.5 mmol/L (ref 3.5–5.1)
Sodium: 139 mmol/L (ref 135–145)
TCO2: 28 mmol/L (ref 22–32)

## 2021-02-22 SURGERY — UPPER ESOPHAGEAL ENDOSCOPIC ULTRASOUND (EUS)
Anesthesia: Monitor Anesthesia Care

## 2021-02-22 MED ORDER — PROPOFOL 500 MG/50ML IV EMUL
INTRAVENOUS | Status: DC | PRN
  Administered 2021-02-22: 50 ug/kg/min via INTRAVENOUS

## 2021-02-22 MED ORDER — DEXMEDETOMIDINE (PRECEDEX) IN NS 20 MCG/5ML (4 MCG/ML) IV SYRINGE
PREFILLED_SYRINGE | INTRAVENOUS | Status: DC | PRN
  Administered 2021-02-22: 4 ug via INTRAVENOUS

## 2021-02-22 MED ORDER — SODIUM CHLORIDE 0.9 % IV SOLN: INTRAVENOUS | Status: DC

## 2021-02-22 MED ORDER — ONDANSETRON HCL 4 MG/2ML IJ SOLN
INTRAMUSCULAR | Status: DC | PRN
  Administered 2021-02-22: 4 mg via INTRAVENOUS

## 2021-02-22 MED ORDER — PHENYLEPHRINE HCL-NACL 10-0.9 MG/250ML-% IV SOLN
INTRAVENOUS | Status: DC | PRN
  Administered 2021-02-22: 40 ug/min via INTRAVENOUS

## 2021-02-22 MED ORDER — ESOMEPRAZOLE MAGNESIUM 40 MG PO CPDR: 40.0000 mg | DELAYED_RELEASE_CAPSULE | Freq: Two times a day (BID) | ORAL | 1 refills | Status: DC

## 2021-02-22 MED ORDER — SPOT INK MARKER SYRINGE KIT
PACK | SUBMUCOSAL | Status: AC
  Filled 2021-02-22: qty 5

## 2021-02-22 MED ORDER — LIDOCAINE 2% (20 MG/ML) 5 ML SYRINGE
INTRAMUSCULAR | Status: DC | PRN
  Administered 2021-02-22: 40 mg via INTRAVENOUS

## 2021-02-22 MED ORDER — PHENYLEPHRINE 40 MCG/ML (10ML) SYRINGE FOR IV PUSH (FOR BLOOD PRESSURE SUPPORT)
PREFILLED_SYRINGE | INTRAVENOUS | Status: DC | PRN
  Administered 2021-02-22: 40 ug via INTRAVENOUS

## 2021-02-22 MED ORDER — APIXABAN 5 MG PO TABS: 5.0000 mg | ORAL_TABLET | Freq: Two times a day (BID) | ORAL | 3 refills | Status: DC

## 2021-02-22 MED ORDER — SPOT INK MARKER SYRINGE KIT
PACK | SUBMUCOSAL | Status: DC | PRN
  Administered 2021-02-22: 3 mL via SUBMUCOSAL

## 2021-02-22 MED ORDER — SUCRALFATE 1 GM/10ML PO SUSP: 1.0000 g | Freq: Two times a day (BID) | ORAL | 1 refills | Status: DC

## 2021-02-22 NOTE — Discharge Instructions (Signed)
YOU HAD AN ENDOSCOPIC PROCEDURE TODAY: Refer to the procedure report and other information in the discharge instructions given to you for any specific questions about what was found during the examination. If this information does not answer your questions, please call [place] office at [phone] to clarify.  ° °YOU SHOULD EXPECT: Some feelings of bloating in the abdomen. Passage of more gas than usual. Walking can help get rid of the air that was put into your GI tract during the procedure and reduce the bloating. If you had a lower endoscopy (such as a colonoscopy or flexible sigmoidoscopy) you may notice spotting of blood in your stool or on the toilet paper. Some abdominal soreness may be present for a day or two, also. ° °DIET: Your first meal following the procedure should be a light meal and then it is ok to progress to your normal diet. A half-sandwich or bowl of soup is an example of a good first meal. Heavy or fried foods are harder to digest and may make you feel nauseous or bloated. Drink plenty of fluids but you should avoid alcoholic beverages for 24 hours. If you had a esophageal dilation, please see attached instructions for diet.   ° °ACTIVITY: Your care partner should take you home directly after the procedure. You should plan to take it easy, moving slowly for the rest of the day. You can resume normal activity the day after the procedure however YOU SHOULD NOT DRIVE, use power tools, machinery or perform tasks that involve climbing or major physical exertion for 24 hours (because of the sedation medicines used during the test).  ° °SYMPTOMS TO REPORT IMMEDIATELY: °A gastroenterologist can be reached at any hour. Please call [phone]  for any of the following symptoms:  °Following lower endoscopy (colonoscopy, flexible sigmoidoscopy) °Excessive amounts of blood in the stool  °Significant tenderness, worsening of abdominal pains  °Swelling of the abdomen that is new, acute  °Fever of 100° or  higher  °Following upper endoscopy (EGD, EUS, ERCP, esophageal dilation) °Vomiting of blood or coffee ground material  °New, significant abdominal pain  °New, significant chest pain or pain under the shoulder blades  °Painful or persistently difficult swallowing  °New shortness of breath  °Black, tarry-looking or red, bloody stools ° °FOLLOW UP:  °If any biopsies were taken you will be contacted by phone or by letter within the next 1-3 weeks. Call [phone]  if you have not heard about the biopsies in 3 weeks.  °Please also call with any specific questions about appointments or follow up tests. ° °

## 2021-02-22 NOTE — Interval H&P Note (Signed)
History and Physical Interval Note:  02/22/2021 12:05 PM  Melissa Adams  has presented today for surgery, with the diagnosis of Duodenal NET, Abnormal EGD.  The various methods of treatment have been discussed with the patient and family. After consideration of risks, benefits and other options for treatment, the patient has consented to  Procedure(s): UPPER ESOPHAGEAL ENDOSCOPIC ULTRASOUND (EUS) (N/A) ENDOSCOPIC MUCOSAL RESECTION (N/A) as a surgical intervention.  The patient's history has been reviewed, patient examined, no change in status, stable for surgery.  I have reviewed the patient's chart and labs.  Questions were answered to the patient's satisfaction.     [employer]

## 2021-02-22 NOTE — Transfer of Care (Signed)
Immediate Anesthesia Transfer of Care Note  Patient: Melissa Adams  Procedure(s) Performed: UPPER ESOPHAGEAL ENDOSCOPIC ULTRASOUND (EUS) (N/A ) ENDOSCOPIC MUCOSAL RESECTION (N/A ) BIOPSY SUBMUCOSAL LIFTING INJECTION HEMOSTASIS CLIP PLACEMENT SUBMUCOSAL TATTOO INJECTION  Patient Location: PACU  Anesthesia Type:MAC  Level of Consciousness: drowsy and patient cooperative  Airway & Oxygen Therapy: Patient Spontanous Breathing and Patient connected to nasal cannula oxygen  Post-op Assessment: Report given to RN and Post -op Vital signs reviewed and stable  Post vital signs: Reviewed and stable  Last Vitals:  Vitals Value Taken Time  BP 109/45 02/22/21 1343  Temp    Pulse 41 02/22/21 1342  Resp 22 02/22/21 1344  SpO2 95 % 02/22/21 1342  Vitals shown include unvalidated device data.  Last Pain:  Vitals:   02/22/21 1335  TempSrc:   PainSc: 0-No pain         Complications: No complications documented.

## 2021-02-22 NOTE — Anesthesia Preprocedure Evaluation (Signed)
Anesthesia Evaluation  Patient identified by MRN, date of birth, ID band Patient awake    Reviewed: Allergy & Precautions, H&P , NPO status , Patient's Chart, lab work & pertinent test results, reviewed documented beta blocker date and time , Unable to perform ROS - Chart review only  History of Anesthesia Complications Negative for: history of anesthetic complications  Airway Mallampati: II   Neck ROM: Full  Mouth opening: Limited Mouth Opening  Dental  (+) Partial Lower, Partial Upper, Dental Advisory Given   Pulmonary shortness of breath and with exertion, asthma , sleep apnea , former smoker,  Covid 19- 10/24/20- recovered   breath sounds clear to auscultation + decreased breath sounds      Cardiovascular hypertension, Pt. on medications and Pt. on home beta blockers +CHF  + dysrhythmias Atrial Fibrillation + Valvular Problems/Murmurs MR  Rhythm:Irregular Rate:Normal + Systolic murmurs Echo 07/1693 1. Left ventricular ejection fraction, by estimation, is 30 to 35%. The left ventricle has moderate to severely decreased function. The left ventricle demonstrates global hypokinesis. The left ventricular internal cavity size was mildly dilated. Left ventricular diastolic parameters are indeterminate.  2. Right ventricular systolic function is mildly reduced. The right ventricular size is moderately enlarged. There is mildly elevated pulmonary artery systolic pressure. D-shaped interventricular septum consistent with RV pressure/volume overload. PA systolic pressure 43 mmHg.  3. Left atrial size was moderately dilated.  4. Right atrial size was moderately dilated.  5. The mitral valve is normal in structure. Moderate mitral valve regurgitation. No evidence of mitral stenosis.  6. Tricuspid valve regurgitation is moderate.  7. The aortic valve is tricuspid. Aortic valve regurgitation is not visualized. Mild aortic valve sclerosis is  present, with no evidence of aortic valve stenosis.  8. The inferior vena cava is dilated in size with <50% respiratory variability, suggesting right atrial pressure of 15 mmHg.    Neuro/Psych negative neurological ROS  negative psych ROS   GI/Hepatic Neg liver ROS, Duodenal net- abnormal EGD   Endo/Other  Hyperlipidemia Obesity  Renal/GU Dialysis and ESRFRenal disease  negative genitourinary   Musculoskeletal  (+) Arthritis , Osteoarthritis,    Abdominal (+) + obese,   Peds  Hematology  (+) Blood dyscrasia, anemia , Eliquis therapy- last dose   Anesthesia Other Findings   Reproductive/Obstetrics negative OB ROS                             Anesthesia Physical  Anesthesia Plan  ASA: IV  Anesthesia Plan: MAC   Post-op Pain Management:    Induction: Intravenous  PONV Risk Score and Plan: 2 and Propofol infusion and Treatment may vary due to age or medical condition  Airway Management Planned: Nasal Cannula and Natural Airway  Additional Equipment:   Intra-op Plan:   Post-operative Plan:   Informed Consent: I have reviewed the patients History and Physical, chart, labs and discussed the procedure including the risks, benefits and alternatives for the proposed anesthesia with the patient or authorized representative who has indicated his/her understanding and acceptance.   Patient has DNR.   Dental advisory given  Plan Discussed with: CRNA and Anesthesiologist  Anesthesia Plan Comments:         Anesthesia Quick Evaluation

## 2021-02-22 NOTE — Anesthesia Postprocedure Evaluation (Signed)
Anesthesia Post Note  Patient: Melissa Adams  Procedure(s) Performed: UPPER ESOPHAGEAL ENDOSCOPIC ULTRASOUND (EUS) (N/A ) ENDOSCOPIC MUCOSAL RESECTION (N/A ) BIOPSY SUBMUCOSAL LIFTING INJECTION HEMOSTASIS CLIP PLACEMENT SUBMUCOSAL TATTOO INJECTION     Patient location during evaluation: PACU Anesthesia Type: MAC Level of consciousness: awake and alert Pain management: pain level controlled Vital Signs Assessment: post-procedure vital signs reviewed and stable Respiratory status: spontaneous breathing, nonlabored ventilation and respiratory function stable Cardiovascular status: stable and blood pressure returned to baseline Anesthetic complications: no   No complications documented.  Last Vitals:  Vitals:   02/22/21 1400 02/22/21 1408  BP: (!) 107/44 (!) 107/40  Pulse: 90 94  Resp: 16 (!) 23  Temp:    SpO2: 95% 96%    Last Pain:  Vitals:   02/22/21 1408  TempSrc:   PainSc: 0-No pain                 Audry Pili

## 2021-02-22 NOTE — Progress Notes (Signed)
Outside labs for review  02/14/2021 Hemoglobin 12.1 Hematocrit 36.3   02/08/2021 Hemoglobin 11.8 Hematocrit 35.4 MCV 102 Platelets 106 WBC 5.2 Sodium 141 Potassium 5.2 Creatinine 7.26 Iron/TIBC 132/280 Saturation 47%   These labs will be scanned into the chart.

## 2021-02-22 NOTE — Op Note (Addendum)
[facility] Patient Name: Melissa Adams Procedure Date : 02/22/2021 MRN: [id number] Attending MD: Justice Britain , MD Date of Birth: 1946-11-01 CSN: [id number] Age: 75 Admit Type: Outpatient Procedure:                Upper EUS Indications:              Duodenal deformity on endoscopy/Subepithelial tumor                            vs. extrinsic compression, Neuroendocrine Tumor Providers:                Justice Britain, MD, Kary Kos RN, RN, Cletis [place], Technician Referring MD:             Carlota Raspberry. Havery Moros, MD, [name] Medicines:                Monitored Anesthesia Care Complications:            No immediate complications. Estimated Blood Loss:     Estimated blood loss was minimal. Procedure:                Pre-Anesthesia Assessment:                           - Prior to the procedure, a History and Physical                            was performed, and patient medications and                            allergies were reviewed. The patient's tolerance of                            previous anesthesia was also reviewed. The risks                            and benefits of the procedure and the sedation                            options and risks were discussed with the patient.                            All questions were answered, and informed consent                            was obtained. Prior Anticoagulants: The patient has                            taken Eliquis (apixaban), last dose was 2 days                            prior to procedure. ASA Grade Assessment: III - A  patient with severe systemic disease. After                            reviewing the risks and benefits, the patient was                            deemed in satisfactory condition to undergo the                            procedure.                           After obtaining informed consent, the endoscope was                             passed under direct vision. Throughout the                            procedure, the patient's blood pressure, pulse, and                            oxygen saturations were monitored continuously. The                            [id number] ([id number]) Olympus therapeutic                            gastroscope was introduced through the mouth, and                            advanced to the second part of duodenum. The                            [id number] ([id number]) Olympus forward view EUS scope                            was introduced through the mouth, and advanced to                            the duodenum for ultrasound examination from the                            stomach and duodenum. The upper EUS was somewhat                            difficult. Successful completion of the procedure                            was aided by performing the maneuvers documented                            (below) in this report. The patient tolerated the  procedure. Scope In: Scope Out: Findings:      ENDOSCOPIC FINDING: :      No gross lesions were noted in the entire esophagus.      The Z-line was irregular and was found 43 cm from the incisors.      A 2 cm hiatal hernia was present.      Scattered moderate inflammation characterized by congestion (edema),       erosions, erythema and friability was found in the entire examined       stomach. Biopsies were taken with a cold forceps for histology and       Helicobacter pylori testing.      Three non-bleeding superficial duodenal ulcers with a clean ulcer base       (Forrest Class III) were found in the duodenal bulb. The largest lesion       was 5 mm in largest dimension.      Patchy moderate inflammation characterized by erosions, erythema,       friability and granularity was found in the duodenal bulb, in the first       portion of the duodenum and in the second portion of the duodenum.        Biopsies were taken with a cold forceps for histology and Helicobacter       pylori testing.      A single 10 mm submucosal nodule was found in the second portion of the       duodenum. After the EUS was completed, preparations were made for       mucosal resection. NBI imaging and White-light endoscopy was done to       demarcate the borders of the lesion. Orise gel was injected to raise the       lesion. Band ligator and snare mucosal resection was performed.       Resection and retrieval were complete. To prevent bleeding after mucosal       resection, six hemostatic clips were successfully placed (MR       conditional). There was no bleeding during, or at the end, of the       procedure. Tattoo placed on contralateral wall for marking purposes in       future surveillance of region.      ENDOSONOGRAPHIC FINDING: :      A round intramural (subepithelial) lesion was found in the second       portion of the duodenum. The lesion was hypoechoic. Endosonographically,       the lesion appeared to originate from within the luminal       interface/superficial mucosa (Layer 1) and deep mucosa (Layer 2). The       lesion measured 10.6 mm (in maximum thickness). The lesion also measured       11.9 mm in diameter. The outer margins were irregular.      Endosonographic imaging in the visualized portion of the liver showed no       mass.      The celiac region was visualized. Impression:               EGD Impression:                           - No gross lesions in esophagus. Z-line irregular,  43 cm from the incisors.                           - 2 cm hiatal hernia.                           - Gastritis. Biopsied.                           - Non-bleeding duodenal ulcers with a clean ulcer                            base (Forrest Class III) in bulb.                           - Duodenitis. Biopsied.                           - Submucosal nodule found in the duodenum. After                             EUS completed, removal was accomplished via Snare                            Band Ligation EMR. Clips (MR conditional) were                            placed. Tattoo placed on contralateral wall for                            marking purposes.                           EUS Impression:                           - An intramural (subepithelial) lesion was found in                            the second portion of the duodenum. The lesion                            appeared to originate from within the luminal                            interface/superficial mucosa (Layer 1) and deep                            mucosa (Layer 2). A tissue diagnosis was obtained                            prior to this exam. This is consistent with a                            neuroendocrine tumor. Recommendation:           -  The patient will be observed post-procedure,                            until all discharge criteria are met.                           - Discharge patient to home.                           - Patient has a contact number available for                            emergencies. The signs and symptoms of potential                            delayed complications were discussed with the                            patient. Return to normal activities tomorrow.                            Written discharge instructions were provided to the                            patient.                           - Full liquid diet today.                           - Observe patient's clinical course.                           - Await path results.                           - May restart Eliquis on 4/3 PM (72 hours from                            completion of today's procedure in effort of                            decreasing risk of post-interventional bleeding.                           - Start Nexium 40 mg twice daily.                           - Carafate twice daily to aid in  healing the                            resection site and the ulcerations.                           - Depending on final pathology will dictate  additional workup/follow up of this area                            (especially based on if NET is found to the margin                            or not) as well as need to ensure healing of the                            upper GI tract which does look different compared                            to prior procedure.                           - The findings and recommendations were discussed                            with the patient.                           - The findings and recommendations were discussed                            with the patient's family. Procedure Code(s):        --- Professional ---                           [phone], Esophagogastroduodenoscopy, flexible,                            transoral; with endoscopic mucosal resection                           43237, Esophagogastroduodenoscopy, flexible,                            transoral; with endoscopic ultrasound examination                            limited to the esophagus, stomach or duodenum, and                            adjacent structures Diagnosis Code(s):        --- Professional ---                           K22.8, Other specified diseases of esophagus                           K44.9, Diaphragmatic hernia without obstruction or                            gangrene  K29.70, Gastritis, unspecified, without bleeding                           K26.9, Duodenal ulcer, unspecified as acute or                            chronic, without hemorrhage or perforation                           K29.80, Duodenitis without bleeding                           K31.89, Other diseases of stomach and duodenum CPT copyright 2019 [employer]. All rights reserved. The codes documented in this report are preliminary and upon  coder review may  be revised to meet current compliance requirements. Justice Britain, MD 02/22/2021 1:47:20 PM Number of Addenda: 0

## 2021-02-23 ENCOUNTER — Encounter (HOSPITAL_COMMUNITY): Payer: Self-pay | Admitting: Gastroenterology

## 2021-02-23 ENCOUNTER — Telehealth (HOSPITAL_COMMUNITY): Payer: Self-pay | Admitting: *Deleted

## 2021-02-23 ENCOUNTER — Other Ambulatory Visit (INDEPENDENT_AMBULATORY_CARE_PROVIDER_SITE_OTHER): Payer: Self-pay | Admitting: Primary Care

## 2021-02-23 ENCOUNTER — Other Ambulatory Visit (HOSPITAL_COMMUNITY): Payer: Self-pay | Admitting: Cardiology

## 2021-02-23 NOTE — Telephone Encounter (Signed)
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 She was told to hold eliquis 3 days prior to that procedure. She wants to know if its ok that patient is off eliquis for a week due to these procedures.  Routed to [place] for advice

## 2021-02-23 NOTE — Telephone Encounter (Signed)
Not ideal, but think she will have to hold the Eliquis the whole time if they will be that close together in placement.

## 2021-02-23 NOTE — Telephone Encounter (Signed)
Requested medication (s) are due for refill today: yes   Requested medication (s) are on the active medication list: yes  Last refill:  11/24/20 #30 3 refills  Future visit scheduled: no  Notes to clinic:  pharmacy note: maximum refills reached, please advise      Requested Prescriptions  Pending Prescriptions Disp Refills   allopurinol (ZYLOPRIM) 300 MG tablet [Pharmacy Med Name: Allopurinol 300MG  TABS] 30 tablet 3    Sig: TAKE 1 TABLET BY MOUTH EVERY NIGHT AT BEDTIME      Endocrinology:  Gout Agents Failed - 02/23/2021  3:20 PM      Failed - Uric Acid in normal range and within 360 days    No results found for: POCURA, LABURIC        Failed - Cr in normal range and within 360 days    Creatinine, Ser  Date Value Ref Range Status  02/22/2021 6.00 (H) 0.44 - 1.00 mg/dL Final   Creatinine, Urine  Date Value Ref Range Status  10/12/2019 59.39 mg/dL Final          Passed - Valid encounter within last 12 months    Recent Outpatient Visits           3 months ago Hospital discharge follow-up   [place], [place], NP   8 months ago Need for Tdap vaccination   [place], [place], NP   11 months ago Need for Tdap vaccination   [place] [name], NP   1 year ago Encounter to establish care   [place] [name], NP

## 2021-02-23 NOTE — Telephone Encounter (Signed)
Pt's daughter aware.

## 2021-02-26 ENCOUNTER — Encounter (HOSPITAL_COMMUNITY): Payer: Medicare HMO | Admitting: Cardiology

## 2021-02-27 ENCOUNTER — Other Ambulatory Visit (INDEPENDENT_AMBULATORY_CARE_PROVIDER_SITE_OTHER): Payer: Self-pay | Admitting: Primary Care

## 2021-02-27 ENCOUNTER — Encounter: Payer: Self-pay | Admitting: Gastroenterology

## 2021-02-27 LAB — SURGICAL PATHOLOGY

## 2021-02-28 ENCOUNTER — Other Ambulatory Visit (HOSPITAL_COMMUNITY)
Admission: RE | Admit: 2021-02-28 | Discharge: 2021-02-28 | Disposition: A | Discharge status: Home / Self Care | Payer: Medicare Other | Source: Ambulatory Visit | Attending: Vascular Surgery | Admitting: Vascular Surgery

## 2021-02-28 ENCOUNTER — Encounter (HOSPITAL_COMMUNITY): Payer: Self-pay | Admitting: Vascular Surgery

## 2021-02-28 DIAGNOSIS — Z20822 Contact with and (suspected) exposure to covid-19: Secondary | ICD-10-CM | POA: Diagnosis not present

## 2021-02-28 DIAGNOSIS — Z01812 Encounter for preprocedural laboratory examination: Secondary | ICD-10-CM | POA: Insufficient documentation

## 2021-02-28 LAB — SARS CORONAVIRUS 2 (TAT 6-24 HRS): SARS Coronavirus 2: NEGATIVE

## 2021-02-28 NOTE — Progress Notes (Addendum)
Cardiologist: Dr. Aundra Dubin  EKG: 11/27/20 CXR: 11/18/20 ECHO: 11/17/20 Stress Test: 2011 Cardiac Cath: 08/22/11  Fasting Blood Sugar- na Checks Blood Sugar__na_ times a day  OSA: Yes, wears 2L 02 at HS.  No cpap  ASA: No Eliquis: Last dose 02/20/21  Covid test 02/28/21 pending   Anesthesia Review: Yes, cardiac history and oxygen at night  Patient denies shortness of breath, fever, cough, and chest pain at PAT appointment.  Patient verbalized understanding of instructions provided today at the PAT appointment.  Patient asked to review instructions at home and day of surgery.

## 2021-03-01 ENCOUNTER — Ambulatory Visit (HOSPITAL_COMMUNITY): Payer: Medicare Other | Admitting: Physician Assistant

## 2021-03-01 ENCOUNTER — Encounter (HOSPITAL_COMMUNITY)
Admission: RE | Disposition: A | Discharge status: Home / Self Care | Payer: Self-pay | Source: Home / Self Care | Attending: Vascular Surgery

## 2021-03-01 ENCOUNTER — Encounter (HOSPITAL_COMMUNITY): Payer: Self-pay | Admitting: Vascular Surgery

## 2021-03-01 ENCOUNTER — Ambulatory Visit (HOSPITAL_COMMUNITY)
Admission: RE | Admit: 2021-03-01 | Discharge: 2021-03-01 | Disposition: A | Discharge status: Home / Self Care | Payer: Medicare Other | Attending: Vascular Surgery | Admitting: Vascular Surgery

## 2021-03-01 ENCOUNTER — Other Ambulatory Visit: Payer: Self-pay

## 2021-03-01 DIAGNOSIS — Z7901 Long term (current) use of anticoagulants: Secondary | ICD-10-CM | POA: Insufficient documentation

## 2021-03-01 DIAGNOSIS — Z87891 Personal history of nicotine dependence: Secondary | ICD-10-CM | POA: Insufficient documentation

## 2021-03-01 DIAGNOSIS — Z79899 Other long term (current) drug therapy: Secondary | ICD-10-CM | POA: Diagnosis not present

## 2021-03-01 DIAGNOSIS — Y841 Kidney dialysis as the cause of abnormal reaction of the patient, or of later complication, without mention of misadventure at the time of the procedure: Secondary | ICD-10-CM | POA: Insufficient documentation

## 2021-03-01 DIAGNOSIS — I120 Hypertensive chronic kidney disease with stage 5 chronic kidney disease or end stage renal disease: Secondary | ICD-10-CM | POA: Insufficient documentation

## 2021-03-01 DIAGNOSIS — T82510A Breakdown (mechanical) of surgically created arteriovenous fistula, initial encounter: Secondary | ICD-10-CM | POA: Insufficient documentation

## 2021-03-01 DIAGNOSIS — Z992 Dependence on renal dialysis: Secondary | ICD-10-CM | POA: Insufficient documentation

## 2021-03-01 DIAGNOSIS — N186 End stage renal disease: Secondary | ICD-10-CM | POA: Diagnosis not present

## 2021-03-01 DIAGNOSIS — N185 Chronic kidney disease, stage 5: Secondary | ICD-10-CM | POA: Diagnosis not present

## 2021-03-01 DIAGNOSIS — Z91013 Allergy to seafood: Secondary | ICD-10-CM | POA: Diagnosis not present

## 2021-03-01 DIAGNOSIS — I4891 Unspecified atrial fibrillation: Secondary | ICD-10-CM | POA: Diagnosis not present

## 2021-03-01 HISTORY — PX: FISTULA SUPERFICIALIZATION: SHX6341

## 2021-03-01 LAB — POCT I-STAT, CHEM 8
BUN: 31 mg/dL — ABNORMAL HIGH (ref 8–23)
Calcium, Ion: 1.18 mmol/L (ref 1.15–1.40)
Chloride: 100 mmol/L (ref 98–111)
Creatinine, Ser: 5.9 mg/dL — ABNORMAL HIGH (ref 0.44–1.00)
Glucose, Bld: 79 mg/dL (ref 70–99)
HCT: 42 % (ref 36.0–46.0)
Hemoglobin: 14.3 g/dL (ref 12.0–15.0)
Potassium: 3.9 mmol/L (ref 3.5–5.1)
Sodium: 138 mmol/L (ref 135–145)
TCO2: 29 mmol/L (ref 22–32)

## 2021-03-01 SURGERY — FISTULA SUPERFICIALIZATION
Anesthesia: General | Site: Arm Upper

## 2021-03-01 MED ORDER — PHENYLEPHRINE HCL-NACL 10-0.9 MG/250ML-% IV SOLN
INTRAVENOUS | Status: DC | PRN
  Administered 2021-03-01: 40 ug/min via INTRAVENOUS

## 2021-03-01 MED ORDER — ONDANSETRON HCL 4 MG/2ML IJ SOLN
INTRAMUSCULAR | Status: DC | PRN
  Administered 2021-03-01: 4 mg via INTRAVENOUS

## 2021-03-01 MED ORDER — HEPARIN SODIUM (PORCINE) 1000 UNIT/ML IJ SOLN
INTRAMUSCULAR | Status: AC
  Filled 2021-03-01: qty 1

## 2021-03-01 MED ORDER — PHENYLEPHRINE 40 MCG/ML (10ML) SYRINGE FOR IV PUSH (FOR BLOOD PRESSURE SUPPORT)
PREFILLED_SYRINGE | INTRAVENOUS | Status: DC | PRN
  Administered 2021-03-01 (×2): 120 ug via INTRAVENOUS
  Administered 2021-03-01: 160 ug via INTRAVENOUS

## 2021-03-01 MED ORDER — ONDANSETRON HCL 4 MG/2ML IJ SOLN
INTRAMUSCULAR | Status: AC
  Filled 2021-03-01: qty 2

## 2021-03-01 MED ORDER — CHLORHEXIDINE GLUCONATE 4 % EX LIQD: 60.0000 mL | Freq: Once | CUTANEOUS | Status: DC

## 2021-03-01 MED ORDER — DEXAMETHASONE SODIUM PHOSPHATE 10 MG/ML IJ SOLN
INTRAMUSCULAR | Status: AC
  Filled 2021-03-01: qty 1

## 2021-03-01 MED ORDER — FENTANYL CITRATE (PF) 100 MCG/2ML IJ SOLN: 25.0000 ug | INTRAMUSCULAR | Status: DC | PRN

## 2021-03-01 MED ORDER — CHLORHEXIDINE GLUCONATE 0.12 % MT SOLN: 15.0000 mL | Freq: Once | OROMUCOSAL | Status: AC

## 2021-03-01 MED ORDER — 0.9 % SODIUM CHLORIDE (POUR BTL) OPTIME
TOPICAL | Status: DC | PRN
  Administered 2021-03-01: 1000 mL

## 2021-03-01 MED ORDER — ESMOLOL HCL 100 MG/10ML IV SOLN
INTRAVENOUS | Status: DC | PRN
  Administered 2021-03-01: 20 mg via INTRAVENOUS

## 2021-03-01 MED ORDER — ONDANSETRON HCL 4 MG/2ML IJ SOLN: 4.0000 mg | Freq: Once | INTRAMUSCULAR | Status: DC | PRN

## 2021-03-01 MED ORDER — LIDOCAINE-EPINEPHRINE 1 %-1:100000 IJ SOLN
INTRAMUSCULAR | Status: AC
  Filled 2021-03-01: qty 1

## 2021-03-01 MED ORDER — CEFAZOLIN SODIUM-DEXTROSE 2-4 GM/100ML-% IV SOLN
2.0000 g | INTRAVENOUS | Status: AC
  Administered 2021-03-01: 2 g via INTRAVENOUS
  Filled 2021-03-01: qty 100

## 2021-03-01 MED ORDER — PROPOFOL 10 MG/ML IV BOLUS
INTRAVENOUS | Status: DC | PRN
  Administered 2021-03-01: 130 mg via INTRAVENOUS

## 2021-03-01 MED ORDER — FENTANYL CITRATE (PF) 100 MCG/2ML IJ SOLN
INTRAMUSCULAR | Status: DC | PRN
  Administered 2021-03-01: 25 ug via INTRAVENOUS
  Administered 2021-03-01: 50 ug via INTRAVENOUS

## 2021-03-01 MED ORDER — ORAL CARE MOUTH RINSE: 15.0000 mL | Freq: Once | OROMUCOSAL | Status: AC

## 2021-03-01 MED ORDER — OXYCODONE-ACETAMINOPHEN 5-325 MG PO TABS: 1.0000 | ORAL_TABLET | Freq: Four times a day (QID) | ORAL | 0 refills | Status: DC | PRN

## 2021-03-01 MED ORDER — PROPOFOL 10 MG/ML IV BOLUS
INTRAVENOUS | Status: AC
  Filled 2021-03-01: qty 20

## 2021-03-01 MED ORDER — LIDOCAINE HCL (CARDIAC) PF 100 MG/5ML IV SOSY
PREFILLED_SYRINGE | INTRAVENOUS | Status: DC | PRN
  Administered 2021-03-01: 80 mg via INTRAVENOUS

## 2021-03-01 MED ORDER — SODIUM CHLORIDE 0.9 % IV SOLN: INTRAVENOUS | Status: DC

## 2021-03-01 MED ORDER — FENTANYL CITRATE (PF) 250 MCG/5ML IJ SOLN
INTRAMUSCULAR | Status: AC
  Filled 2021-03-01: qty 5

## 2021-03-01 MED ORDER — CHLORHEXIDINE GLUCONATE 0.12 % MT SOLN
OROMUCOSAL | Status: AC
  Administered 2021-03-01: 15 mL via OROMUCOSAL
  Filled 2021-03-01: qty 15

## 2021-03-01 MED ORDER — SODIUM CHLORIDE 0.9 % IV SOLN
INTRAVENOUS | Status: AC
  Filled 2021-03-01: qty 1.2

## 2021-03-01 MED ORDER — SODIUM CHLORIDE 0.9 % IV SOLN
INTRAVENOUS | Status: DC | PRN
  Administered 2021-03-01: 500 mL

## 2021-03-01 MED ORDER — DEXAMETHASONE SODIUM PHOSPHATE 10 MG/ML IJ SOLN
INTRAMUSCULAR | Status: DC | PRN
  Administered 2021-03-01: 8 mg via INTRAVENOUS

## 2021-03-01 SURGICAL SUPPLY — 51 items
ARMBAND PINK RESTRICT EXTREMIT (MISCELLANEOUS) ×4 IMPLANT
BAG DECANTER FOR FLEXI CONT (MISCELLANEOUS) ×4 IMPLANT
BIOPATCH RED 1 DISK 7.0 (GAUZE/BANDAGES/DRESSINGS) IMPLANT
BIOPATCH RED 1IN DISK 7.0MM (GAUZE/BANDAGES/DRESSINGS)
BNDG ESMARK 4X9 LF (GAUZE/BANDAGES/DRESSINGS) ×4 IMPLANT
CANISTER SUCT 3000ML PPV (MISCELLANEOUS) ×4 IMPLANT
CATH PALINDROME-P 19CM W/VT (CATHETERS) IMPLANT
CATH PALINDROME-P 23CM W/VT (CATHETERS) IMPLANT
CATH PALINDROME-P 28CM W/VT (CATHETERS) IMPLANT
CLIP VESOCCLUDE MED 6/CT (CLIP) ×4 IMPLANT
CLIP VESOCCLUDE SM WIDE 6/CT (CLIP) ×4 IMPLANT
COVER PROBE W GEL 5X96 (DRAPES) IMPLANT
COVER SURGICAL LIGHT HANDLE (MISCELLANEOUS) ×4 IMPLANT
COVER WAND RF STERILE (DRAPES) ×4 IMPLANT
CUFF TOURN SGL QUICK 18X4 (TOURNIQUET CUFF) ×4 IMPLANT
DERMABOND ADVANCED (GAUZE/BANDAGES/DRESSINGS) ×2
DERMABOND ADVANCED .7 DNX12 (GAUZE/BANDAGES/DRESSINGS) ×2 IMPLANT
DRAPE C-ARM 42X72 X-RAY (DRAPES) IMPLANT
DRAPE CHEST BREAST 15X10 FENES (DRAPES) ×4 IMPLANT
ELECT REM PT RETURN 9FT ADLT (ELECTROSURGICAL) ×4
ELECTRODE REM PT RTRN 9FT ADLT (ELECTROSURGICAL) ×2 IMPLANT
GAUZE 4X4 16PLY RFD (DISPOSABLE) ×4 IMPLANT
GLOVE BIO SURGEON STRL SZ7.5 (GLOVE) ×4 IMPLANT
GOWN STRL REUS W/ TWL LRG LVL3 (GOWN DISPOSABLE) ×4 IMPLANT
GOWN STRL REUS W/ TWL XL LVL3 (GOWN DISPOSABLE) ×4 IMPLANT
GOWN STRL REUS W/TWL LRG LVL3 (GOWN DISPOSABLE) ×4
GOWN STRL REUS W/TWL XL LVL3 (GOWN DISPOSABLE) ×4
KIT BASIN OR (CUSTOM PROCEDURE TRAY) ×4 IMPLANT
KIT PALINDROME-P 55CM (CATHETERS) IMPLANT
KIT TURNOVER KIT B (KITS) ×4 IMPLANT
NEEDLE 18GX1X1/2 (RX/OR ONLY) (NEEDLE) ×4 IMPLANT
NEEDLE HYPO 25GX1X1/2 BEV (NEEDLE) ×4 IMPLANT
NS IRRIG 1000ML POUR BTL (IV SOLUTION) ×4 IMPLANT
PACK CV ACCESS (CUSTOM PROCEDURE TRAY) ×4 IMPLANT
PACK SURGICAL SETUP 50X90 (CUSTOM PROCEDURE TRAY) ×4 IMPLANT
PAD ARMBOARD 7.5X6 YLW CONV (MISCELLANEOUS) ×8 IMPLANT
SOAP 2 % CHG 4 OZ (WOUND CARE) ×4 IMPLANT
SUT ETHILON 3 0 PS 1 (SUTURE) IMPLANT
SUT MNCRL AB 4-0 PS2 18 (SUTURE) ×4 IMPLANT
SUT PROLENE 5 0 C 1 24 (SUTURE) ×4 IMPLANT
SUT PROLENE 6 0 BV (SUTURE) ×4 IMPLANT
SUT VIC AB 3-0 SH 27 (SUTURE) ×2
SUT VIC AB 3-0 SH 27X BRD (SUTURE) ×2 IMPLANT
SYR 10ML LL (SYRINGE) ×4 IMPLANT
SYR 20ML LL LF (SYRINGE) ×8 IMPLANT
SYR 5ML LL (SYRINGE) ×4 IMPLANT
SYR CONTROL 10ML LL (SYRINGE) ×4 IMPLANT
TOWEL GREEN STERILE (TOWEL DISPOSABLE) ×4 IMPLANT
TOWEL GREEN STERILE FF (TOWEL DISPOSABLE) ×8 IMPLANT
UNDERPAD 30X36 HEAVY ABSORB (UNDERPADS AND DIAPERS) ×4 IMPLANT
WATER STERILE IRR 1000ML POUR (IV SOLUTION) ×4 IMPLANT

## 2021-03-01 NOTE — Progress Notes (Signed)
Phase II completed.vital signs monitoring discontinued. Waiting for transport home.

## 2021-03-01 NOTE — Transfer of Care (Signed)
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  Last Pain:  Vitals:   03/01/21 0906  PainSc: 0-No pain      Patients Stated Pain Goal: 2 (04/88/89 1694)  Complications: No complications documented.

## 2021-03-01 NOTE — Discharge Instructions (Signed)
[facility]  Discharge Instructions  AV Fistula or Graft Surgery for Dialysis Access  Please refer to the following instructions for your post-procedure care. Your surgeon or physician assistant will discuss any changes with you.  Activity  You may drive the day following your surgery, if you are comfortable and no longer taking prescription pain medication. Resume full activity as the soreness in your incision resolves.  Bathing/Showering  You may shower after you go home. Keep your incision dry for 48 hours. Do not soak in a bathtub, hot tub, or swim until the incision heals completely. You may not shower if you have a hemodialysis catheter.  Incision Care  Clean your incision with mild soap and water after 48 hours. Pat the area dry with a clean towel. You do not need a bandage unless otherwise instructed. Do not apply any ointments or creams to your incision. You may have skin glue on your incision. Do not peel it off. It will come off on its own in about one week. Your arm may swell a bit after surgery. To reduce swelling use pillows to elevate your arm so it is above your heart. Your doctor will tell you if you need to lightly wrap your arm with an ACE bandage.  Diet  Resume your normal diet. There are not special food restrictions following this procedure. In order to heal from your surgery, it is CRITICAL to get adequate nutrition. Your body requires vitamins, minerals, and protein. Vegetables are the best source of vitamins and minerals. Vegetables also provide the perfect balance of protein. Processed food has little nutritional value, so try to avoid this.  Medications  Resume taking all of your medications. If your incision is causing pain, you may take over-the counter pain relievers such as acetaminophen (Tylenol). If you were prescribed a stronger pain medication, please be aware these medications can cause nausea and constipation. Prevent  nausea by taking the medication with a snack or meal. Avoid constipation by drinking plenty of fluids and eating foods with high amount of fiber, such as fruits, vegetables, and grains.  Do not take Tylenol if you are taking prescription pain medications.  Restart your Eliquis tomorrow 03/02/2021  Follow up Your surgeon may want to see you in the office following your access surgery. If so, this will be arranged at the time of your surgery.  Please call us immediately for any of the following conditions:  . Increased pain, redness, drainage (pus) from your incision site . Fever of 101 degrees or higher . Severe or worsening pain at your incision site . Hand pain or numbness. .  Reduce your risk of vascular disease:  . Stop smoking. If you would like help, call [facility] at 1-800-QUIT-NOW 956-739-8504) or [facility] at [phone]  . Manage your cholesterol . Maintain a desired weight . Control your diabetes . Keep your blood pressure down  Dialysis  It will take several weeks to several months for your new dialysis access to be ready for use. Your surgeon will determine when it is okay to use it. Your nephrologist will continue to direct your dialysis. You can continue to use your Permcath until your new access is ready for use.   03/01/2021 Melissa Adams [id number] Jan 22, 1946  Surgeon(s): Waynetta [name], MD  Procedure(s): PLICATION OF LEFT UPPER EXTREMITY ARTERIOVENOUS FISTULA   x May stick graft on designated area only:  Do NOT stick over incision for 4 weeks. May stick above  incision now. SEE DIAGRAM.   If you have any questions, please call the office at [phone].

## 2021-03-01 NOTE — Anesthesia Preprocedure Evaluation (Addendum)
Anesthesia Evaluation  Patient identified by MRN, date of birth, ID band Patient awake    Reviewed: Allergy & Precautions, NPO status , Patient's Chart, lab work & pertinent test results, reviewed documented beta blocker date and time   Airway Mallampati: II  TM Distance: >3 FB Neck ROM: Full    Dental  (+) Dental Advisory Given, Partial Upper, Partial Lower   Pulmonary asthma , sleep apnea , former smoker,    Pulmonary exam normal breath sounds clear to auscultation       Cardiovascular hypertension, Pt. on home beta blockers and Pt. on medications +CHF  Normal cardiovascular exam+ dysrhythmias Atrial Fibrillation  Rhythm:Regular Rate:Normal  Echo 11/17/20: 1. Left ventricular ejection fraction, by estimation, is 35 to 40%. The  left ventricle has moderately decreased function. The left ventricle  demonstrates global hypokinesis. There is mild concentric left ventricular  hypertrophy. Left ventricular  diastolic parameters are indeterminate.  2. Right ventricular systolic function is normal. The right ventricular  size is normal. There is moderately elevated pulmonary artery systolic  pressure.  3. The mitral valve is normal in structure. Mild mitral valve  regurgitation. No evidence of mitral stenosis.  4. Tricuspid valve regurgitation is moderate to severe.  5. There is restriction of the right and noncoronary cusps. Visually  there appears to be at least moderate aortic stenosis. LVOT VTI was not  recorded, so cannot calculate a dimensionless index. Cannot exclude  low-flow, low gradient aortic stenosis. The  aortic valve is calcified. There is moderate calcification of the aortic  valve. There is moderate thickening of the aortic valve. Aortic valve  regurgitation is not visualized. Mild to moderate aortic valve  sclerosis/calcification is present, without any  evidence of aortic stenosis. Aortic valve mean gradient  measures 9.0 mmHg.  Aortic valve Vmax measures 2.13 m/s.  6. The inferior vena cava is dilated in size with <50% respiratory  variability, suggesting right atrial pressure of 15 mmHg.    Neuro/Psych negative neurological ROS  negative psych ROS   GI/Hepatic Neg liver ROS, GERD  Medicated,  Endo/Other  Obesity   Renal/GU Dialysis and ESRFRenal disease     Musculoskeletal  (+) Arthritis ,   Abdominal   Peds  Hematology  (+) Blood dyscrasia (Eliquis), anemia ,   Anesthesia Other Findings Day of surgery medications reviewed with the patient.  Reproductive/Obstetrics                             Anesthesia Physical Anesthesia Plan  ASA: IV  Anesthesia Plan: General   Post-op Pain Management:    Induction: Intravenous  PONV Risk Score and Plan: 3 and Midazolam, Dexamethasone and Ondansetron  Airway Management Planned: LMA  Additional Equipment:   Intra-op Plan:   Post-operative Plan: Extubation in OR  Informed Consent: I have reviewed the patients History and Physical, chart, labs and discussed the procedure including the risks, benefits and alternatives for the proposed anesthesia with the patient or authorized representative who has indicated his/her understanding and acceptance.     Dental advisory given  Plan Discussed with: CRNA  Anesthesia Plan Comments:         Anesthesia Quick Evaluation

## 2021-03-01 NOTE — Anesthesia Postprocedure Evaluation (Signed)
Anesthesia Post Note  Patient: Melissa Adams  Procedure(s) Performed: PLICATION OF LEFT UPPER EXTREMITY ARTERIOVENOUS FISTULA (Left Arm Upper)     Patient location during evaluation: PACU Anesthesia Type: General Level of consciousness: awake and alert Pain management: pain level controlled Vital Signs Assessment: post-procedure vital signs reviewed and stable Respiratory status: spontaneous breathing, nonlabored ventilation, respiratory function stable and patient connected to nasal cannula oxygen Cardiovascular status: blood pressure returned to baseline and stable Postop Assessment: no apparent nausea or vomiting Anesthetic complications: no   No complications documented.  Last Vitals:  Vitals:   03/01/21 1236 03/01/21 1251  BP: (!) 99/57 114/68  Pulse: 77 94  Resp: 16 18  Temp:  36.4 C  SpO2: 99% 99%    Last Pain:  Vitals:   03/01/21 1251  PainSc: 0-No pain                 Catalina Gravel

## 2021-03-01 NOTE — Anesthesia Procedure Notes (Addendum)
Procedure Name: LMA Insertion Date/Time: 03/01/2021 10:53 AM Performed by: Inda Coke, CRNA Pre-anesthesia Checklist: Patient identified, Emergency Drugs available, Suction available and Patient being monitored Patient Re-evaluated:Patient Re-evaluated prior to induction Oxygen Delivery Method: Circle System Utilized Preoxygenation: Pre-oxygenation with 100% oxygen Induction Type: IV induction Ventilation: Mask ventilation without difficulty LMA: LMA inserted LMA Size: 4.0 Number of attempts: 1 Airway Equipment and Method: Bite block Placement Confirmation: positive ETCO2 Tube secured with: Tape Dental Injury: Teeth and Oropharynx as per pre-operative assessment

## 2021-03-01 NOTE — Op Note (Signed)
    Patient name: Melissa Adams MRN: [id number] DOB: [birth year] Sex: female  03/01/2021 Pre-operative Diagnosis: esrd Post-operative diagnosis:  Same Surgeon:  Erlene Quan C. Donzetta Matters, MD Assistant: Ethelene Hal, MS3 Procedure Performed:  Revision of left arm AV fistula with plication  Indications: 75 year old female with history of end-stage renal disease dialyzing via left arm AV fistula.  There is 1 area of thin skin over the pseudoaneurysmal degenerated area which is had bleeding now has ulcerated scab.  She is indicated for revision.  Findings: Fistula itself appeared healthy.  We remove the ulcerated area which connected directly to the fistula.  At completion there was a strong thrill in the fistula.  There appears to be suitable cannulation sites and the catheter was not placed.   Procedure:  The patient was identified in the holding area and taken to the operating where she was placed supine on the operating room.  LMA with anesthesia was induced.  She was gently prepped draped in left upper extremity in the usual fashion, antibiotics were ministered a timeout was called.  A tourniquet was placed above the fistula site in the upper arm the left arm was exsanguinated with Esmarch.  Tourniquet was inflated for a total of 8 minutes.  An elliptical incision was made around the ulcerated area.  This was carried down through the fistula itself.  We freed up the soft tissue around the fistula.  We then flushed in both directions with heparinized saline filled the fistula with heparinized saline and closed the fistula with running 6-0 Prolene suture in a mattress fashion.  Prior to completion we did refill with heparinized saline to de-air and then allowed down the tourniquet.  There was 1 area of bleeding this was repaired with 5-0 Prolene suture.  We repaired another area as well after freeing up some of the soft tissue.  We obtain hemostasis.  We confirmed flow with Doppler as well as flow in her radial  artery distally with Doppler.  We then irrigated the wound and closed the skin overlying the fistula for Monocryl suture.  Dermabond was placed to level the skin.  She was awakened from anesthesia having tolerated procedure well any complication.  All counts were correct ablation.  EBL: 50 cc    Shereda Graw C. Donzetta Matters, MD [facility] Office: [phone] Pager: [phone]

## 2021-03-01 NOTE — Interval H&P Note (Signed)
History and Physical Interval Note:  03/01/2021 10:17 AM  [place]  has presented today for surgery, with the diagnosis of ESRD.  The various methods of treatment have been discussed with the patient and family. After consideration of risks, benefits and other options for treatment, the patient has consented to  Procedure(s): PLICATION OF LEFT UPPER EXTREMITY ARTERIOVENOUS FISTULA (Left) INSERTION OF DIALYSIS CATHETER (N/A) as a surgical intervention.  The patient's history has been reviewed, patient examined, no change in status, stable for surgery.  I have reviewed the patient's chart and labs.  Questions were answered to the patient's satisfaction.     Servando Snare

## 2021-03-02 ENCOUNTER — Encounter (HOSPITAL_COMMUNITY): Payer: Self-pay | Admitting: Vascular Surgery

## 2021-03-02 ENCOUNTER — Other Ambulatory Visit: Payer: Self-pay

## 2021-03-02 DIAGNOSIS — D3A8 Other benign neuroendocrine tumors: Secondary | ICD-10-CM

## 2021-03-05 ENCOUNTER — Telehealth: Payer: Self-pay | Admitting: General Surgery

## 2021-03-05 NOTE — Telephone Encounter (Signed)
-----   Message from [employer], DO sent at 03/02/2021  8:48 AM EDT ----- Can we ghet her set up for routine f/u with me for ongoing cirrhosis management. Thanks  ----- Message ----- From: Irving Copas., MD Sent: 03/02/2021   4:37 AM EDT To: [name], NP, Timothy Lasso, RN, #  Patty, I called and spoke with the patient's daughter and POA about the results of her recent EGD/EUS.  A separate result letter will be sent to you to be sent to the patient.  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  I discussed that a neuroendocrine tumor is a type of cancer though it is very different than most other duodenal cancers.  It still requires surveillance and will require [place] to place an oncology referral to help [place] with surveillance in the long-term.  I would recommend a repeat EGD/EUS in 1 year since the neuroendocrine tumor was removed completely.  Surveillance imaging will be dictated by our oncology colleagues.  I recommend a chromogranin a level be obtained even though she is on PPI therapy currently and it may make the levels higher and will be helpful for [place] to have that now that she is post resection. Please place a referral to oncology for duodenal neuroendocrine tumor surveillance.  Thanks. Follow-up in clinic with Dr. Bryan Lemma as per prior schedule due to underlying cirrhosis. GM

## 2021-03-05 NOTE — Telephone Encounter (Signed)
Scheduled an appointment with the patients daughter. Morning appointment preferred 04/04/2021 9:00am

## 2021-03-06 ENCOUNTER — Other Ambulatory Visit (HOSPITAL_COMMUNITY): Payer: Medicare HMO

## 2021-03-06 ENCOUNTER — Telehealth: Payer: Self-pay | Admitting: Hematology and Oncology

## 2021-03-06 NOTE — Telephone Encounter (Signed)
Received a new pt referral from Dr. Rush Landmark for Benign neuroendocrine tumor of small intestine. Melissa Adams has been scheduled to see Dr. Lorenso Courier on 4/28 at [facility] date and time has been given to the pt's daughter. Aware to arrive 15 minutes early.

## 2021-03-08 ENCOUNTER — Encounter (HOSPITAL_COMMUNITY): Payer: Self-pay | Admitting: Cardiology

## 2021-03-08 ENCOUNTER — Ambulatory Visit (HOSPITAL_COMMUNITY)
Admission: RE | Admit: 2021-03-08 | Discharge: 2021-03-08 | Disposition: A | Discharge status: Home / Self Care | Payer: Medicare Other | Source: Ambulatory Visit | Attending: Cardiology | Admitting: Cardiology

## 2021-03-08 ENCOUNTER — Other Ambulatory Visit: Payer: Self-pay

## 2021-03-08 VITALS — BP 100/50 | HR 72 | Wt 200.2 lb

## 2021-03-08 DIAGNOSIS — Z87891 Personal history of nicotine dependence: Secondary | ICD-10-CM | POA: Diagnosis not present

## 2021-03-08 DIAGNOSIS — I5022 Chronic systolic (congestive) heart failure: Secondary | ICD-10-CM

## 2021-03-08 DIAGNOSIS — Z7901 Long term (current) use of anticoagulants: Secondary | ICD-10-CM | POA: Insufficient documentation

## 2021-03-08 DIAGNOSIS — Z8616 Personal history of COVID-19: Secondary | ICD-10-CM | POA: Diagnosis not present

## 2021-03-08 DIAGNOSIS — Z8249 Family history of ischemic heart disease and other diseases of the circulatory system: Secondary | ICD-10-CM | POA: Insufficient documentation

## 2021-03-08 DIAGNOSIS — N186 End stage renal disease: Secondary | ICD-10-CM | POA: Insufficient documentation

## 2021-03-08 DIAGNOSIS — I132 Hypertensive heart and chronic kidney disease with heart failure and with stage 5 chronic kidney disease, or end stage renal disease: Secondary | ICD-10-CM | POA: Diagnosis present

## 2021-03-08 DIAGNOSIS — I482 Chronic atrial fibrillation, unspecified: Secondary | ICD-10-CM | POA: Diagnosis not present

## 2021-03-08 DIAGNOSIS — G4733 Obstructive sleep apnea (adult) (pediatric): Secondary | ICD-10-CM | POA: Diagnosis not present

## 2021-03-08 DIAGNOSIS — Z79899 Other long term (current) drug therapy: Secondary | ICD-10-CM | POA: Diagnosis not present

## 2021-03-08 DIAGNOSIS — I081 Rheumatic disorders of both mitral and tricuspid valves: Secondary | ICD-10-CM | POA: Insufficient documentation

## 2021-03-08 DIAGNOSIS — D3A010 Benign carcinoid tumor of the duodenum: Secondary | ICD-10-CM | POA: Diagnosis not present

## 2021-03-08 DIAGNOSIS — I5082 Biventricular heart failure: Secondary | ICD-10-CM | POA: Diagnosis not present

## 2021-03-08 DIAGNOSIS — Z992 Dependence on renal dialysis: Secondary | ICD-10-CM | POA: Diagnosis not present

## 2021-03-08 DIAGNOSIS — I5023 Acute on chronic systolic (congestive) heart failure: Secondary | ICD-10-CM | POA: Insufficient documentation

## 2021-03-08 NOTE — Progress Notes (Signed)
Cardiology: Dr. Aundra Dubin  Nephrology: Dr. Moshe Cipro  75 y.o. with history of chronic systolic HF, mitral valve regurgitation and tricuspid valve regurgitation was scheduled to undergo both mitral and tricuspid valve replacement surgery on 10/13/19 in [place], [place].  He had a prior cath in 9/20 with no significant coronary disease.  Also w/ h/o atrial fibrillation on chronic anticoagulation w/ Eliquis, OSA on CPAP, and HTN.  Echocardiogram on August 05, 2019 in [place] noted an EF of 40 to 45% with severe MR and TR.  She presented to the [facility]11[facility]19 with progressive SOB and wt gain and edema over the prior 2-3 weeks.  She had been staying with her daughter in [place].  She was found in the ER to have markedly elevated BNP and marked volume overload. Hs troponin mildly elevated w/ flat trend 30>>33>>27>>31. SCr 1.91 on admit. BUN 47. K 4.1. COVID-19 +.  D-dimer 12.41. started on IV heparin. V/Q scan showed no evidence of PE. She was in atrial fibrillation with RVR.  Echo was done, showing EF down to 20-25%. She was started on milrinone and diuresed. Despite this, creatinine steadily rose. She ultimately required CVVH.  Significant volume was removed.  Renal function did not recover and she was started on iHD.  TEE-guided DCCV was attempted but she did not stay in NSR.  Amiodarone was stopped as she did not maintain NSR. She was treated with remdesivir and Decadron for COVID-19.   Echo in 5/21 showed EF 30-35%, D-shaped septum with moderately dilated RV/mildly decreased systolic function, PASP 43, moderate MR, moderate TR.   She has had thoracenteses in 6/21, 7/21, and 9/21.  She was admitted with upper GI bleeding in 12/21.  Colonoscopy and EGD did not show source of bleeding.  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  She had capsule endoscopy showing erosion in small bowel with bleeding.  She was taken off Eliquis for 1 week. BP was low in the hospital, she was taken off  Toprol XL and started on amiodarone for rate control.  Echo in 12/21 showed EF 35-40%, global hypokinesis, normal RV, mod-severe TR, ?mild-moderate AS, mild MR.  She had thoracenteses x 2 in 12/21.   She returns for followup of CHF.  Volume managed by HD but takes Lasix on non-HD days (still makes a little urine).  Unchanged dyspnea, short of breath after walking 100-200 feet.  No chest pain.  No lightheadedness.  She was unable to tolerate CPAP.  Weight is stable.    PMH: 1. Gout 2. Chronic atrial fibrillation: She failed TEE-guided DCCV in 12/20.  Prior DCCVs attempted in [place].  3. OSA 4. COVID-19 infection: 11/20-12/20. Treated with remdesivir + dexamethasone.  5. AKI 11/20 with development of ESRD.  6. HTN 7. Chronic systolic CHF: 4/27 LHC in [place], no significant coronary disease.  - [facility] EF 40-45% with severe MR and TR.   - TEE (12/20): EF 20%, diffuse hypokinesis, moderately decreased RV systolic function, moderate-severe TR, peak RV-RA gradient 46 mmHg, moderate functional MR.  - Echo (5/21): EF 30-35%, D-shaped septum with moderately dilated RV/mildly decreased systolic function, PASP 43, moderate MR, moderate TR.  - Echo (12/21): EF 35-40%, global hypokinesis, normal RV, mod-severe TR, ?mild-moderate AS, mild MR.   8. Hyperlipidemia 9. Right pleural effusion: Recurrent, thoracenteses in 6/21, 7/21, 9/21, x 2 in 12/21.  10. Cirrhosis: Noted on abdominal US.  Never a heavy drinker, HCV and HBV negative.  11. Upper GI bleeding:  12/21.  Erosion noted in small bowel.  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   Social History   Socioeconomic History  . Marital status: Widowed    Spouse name: Not on file  . Number of children: Not on file  . Years of education: Not on file  . Highest education level: Not on file  Occupational History  . Not on file  Tobacco Use  . Smoking status: Former Smoker    Types: Cigarettes  . Smokeless  tobacco: Never Used  Vaping Use  . Vaping Use: Never used  Substance and Sexual Activity  . Alcohol use: Not Currently  . Drug use: Not Currently  . Sexual activity: Not Currently  Other Topics Concern  . Not on file  Social History Narrative  . Not on file   Social Determinants of Health   Financial Resource Strain: Not on file  Food Insecurity: Not on file  Transportation Needs: No Transportation Needs  . Lack of Transportation (Medical): No  . Lack of Transportation (Non-Medical): No  Physical Activity: Not on file  Stress: Not on file  Social Connections: Socially Isolated  . Frequency of Communication with Friends and Family: More than three times a week  . Frequency of Social Gatherings with Friends and Family: Once a week  . Attends Religious Services: Never  . Active Member of Clubs or Organizations: No  . Attends Archivist Meetings: Never  . Marital Status: Widowed  Intimate Partner Violence: Not on file   Family History  Problem Relation Age of Onset  . Seizures Father   . CAD Father   . Diabetes Sister   . Lupus Sister   . Hypertension Sister   . Colon cancer Neg Hx   . Esophageal cancer Neg Hx   . Pancreatic cancer Neg Hx   . Stomach cancer Neg Hx   . Inflammatory bowel disease Neg Hx   . Liver disease Neg Hx   . Rectal cancer Neg Hx    ROS: All systems reviewed and negative except as per HPI.   Current Outpatient Medications  Medication Sig Dispense Refill  . acetaminophen (TYLENOL) 325 MG tablet Take 2 tablets (650 mg total) by mouth every 4 (four) hours as needed for headache or mild pain.    Marland Kitchen albuterol (VENTOLIN HFA) 108 (90 Base) MCG/ACT inhaler Inhale 2 puffs into the lungs every 6 (six) hours as needed for wheezing or shortness of breath. 6.7 g 1  . allopurinol (ZYLOPRIM) 300 MG tablet TAKE 1 TABLET BY MOUTH AT BEDTIME 30 tablet 3  . apixaban (ELIQUIS) 5 MG TABS tablet Take 1 tablet (5 mg total) by mouth 2 (two) times daily. 60 tablet  3  . atorvastatin (LIPITOR) 80 MG tablet TAKE 1 TABLET (80 MG TOTAL) BY MOUTH AT BEDTIME. 30 tablet 5  . B Complex-C-Zn-Folic Acid (DIALYVITE 700-FVCB 15) 0.8 MG TABS Take 1 tablet by mouth daily.    . cetirizine (ZYRTEC) 10 MG tablet Take 10 mg by mouth daily as needed for allergies.    . cinacalcet (SENSIPAR) 30 MG tablet Take 30 mg by mouth daily with supper.     . esomeprazole (NEXIUM) 40 MG capsule Take 1 capsule (40 mg total) by mouth 2 (two) times daily before a meal. 30 capsule 1  . ferric citrate (AURYXIA) 1 GM 210 MG(Fe) tablet Take 210 mg by mouth 3 (three) times daily with meals.    . furosemide (LASIX) 40 MG tablet  Take 80 mg by mouth See admin instructions. Take 2 tablets (80 mg) by mouth twice daily on non-dialysis days - Sunday, Tuesday, Thursday    . lidocaine-prilocaine (EMLA) cream Apply 1 application topically See admin instructions. Apply topically to port access one hour prior to dialysis - Monday, Wednesday, Friday, Saturday    . metoprolol succinate (TOPROL-XL) 50 MG 24 hr tablet Take 25 mg by mouth 2 (two) times daily. Take with or immediately following a meal.    . midodrine (PROAMATINE) 10 MG tablet Take 1 tablet (10 mg total) by mouth daily. 30 tablet 0  . montelukast (SINGULAIR) 10 MG tablet Take 1 tablet by mouth once daily 90 tablet 0  . multivitamin (RENA-VIT) TABS tablet Take 1 tablet by mouth daily.    . Nutritional Supplements (,FEEDING SUPPLEMENT, PROSOURCE PLUS) liquid Take 30 mLs by mouth 2 (two) times daily between meals.    Marland Kitchen oxyCODONE-acetaminophen (PERCOCET) 5-325 MG tablet Take 1 tablet by mouth every 6 (six) hours as needed for severe pain. 8 tablet 0  . sucralfate (CARAFATE) 1 GM/10ML suspension Take 10 mLs (1 g total) by mouth 2 (two) times daily. 420 mL 1  . vitamin B-12 (CYANOCOBALAMIN) 1000 MCG tablet Take 1,000 mcg by mouth daily.    . vitamin C (VITAMIN C) 500 MG tablet Take 1 tablet (500 mg total) by mouth daily. 30 tablet 0   No current  facility-administered medications for this encounter.   BP (!) 100/50   Pulse 72   Wt 90.8 kg (200 lb 3.2 oz)   SpO2 98%   BMI 35.46 kg/m  General: NAD Neck: JVP 8 cm, no thyromegaly or thyroid nodule.  Lungs: Decreased BS bilaterally.  CV: Nondisplaced PMI.  Heart regular S1/S2, no S3/S4, 2/6 HSM LLSB.  1+ ankle edema.  No carotid bruit.  Normal pedal pulses.  Abdomen: Soft, nontender, no hepatosplenomegaly, no distention.  Skin: Intact without lesions or rashes.  Neurologic: Alert and oriented x 3.  Psych: Normal affect. Extremities: No clubbing or cyanosis.  HEENT: Normal.   Assessment/Plan:  1. Acute on chronic systolic CHF:  Nonischemic cardiomyopathy, cath in [place] in 9/20 with no significant coronary disease.  Echo in 11/20 with EF 20-25% with dilated and severely dysfunctional RV with D-shaped interventricular septum. MR appeared moderate and TR appeared moderate. With dilated RV, V/Q scan was done and was negative. Biventricular failure. Last echo in [place] per report showed EF 40-45%. Cause of fall in EF uncertain, could be due to atrial fibrillation with RVR (tachycardia-mediated). Myocarditis, potentially from COVID-19, is possible.  She is now HD-dependent for volume control.  Most recent echo in 12/21 showed EF 35-40%.  She takes Lasix on non-HD days, still makes some urine.  Despite HD, she is still mildly volume overloaded on exam today, NYHA class III symptoms. - Continue Toprol XL 25 mg bid.  - Would be reasonable to try to get more fluid off at HD.  2. Valvular heart disease: Patient was supposed to have mitral and tricuspid valve replacements in 11/20 in [place].  TEE in 9/20 in [place] showed mod-severe MR and severe TR. TEE on 10/26/19 showed mod-severe TR and moderate MR. Echo in 5/21 showed moderate MR and moderate TR.  Echo in 12/21 showed moderate-severe TR and mild MR. Her main issue seems to be biventricular failure and would hold off on valve procedures for  the time being.     - Repeat echo at followup in 6 months.  3. Atrial fibrillation:  Chronic per outpatient notes. Per daughter, she has had cardioversions in the past. She failed DCCV on amiodarone on 10/26/19. I think she is unlikely to go back into NSR at this point.   - Continue Toprol XL.   - Continue Eliquis 5 mg bid.  4. ESRD: Appears to be tolerating HD but needs more volume removal.  5. OSA: She did not tolerate CPAP.   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   - Followed by GI.   Followup in 6 months with echo.   [name] 03/08/2021

## 2021-03-08 NOTE — Patient Instructions (Signed)
Please call our office in September to schedule your follow up appointment and echocardiogram  If you have any questions or concerns before your next appointment please send [place] a message through [place] or call our office at [phone].    TO LEAVE A MESSAGE FOR THE NURSE SELECT OPTION 2, PLEASE LEAVE A MESSAGE INCLUDING: . YOUR NAME . DATE OF BIRTH . CALL BACK NUMBER . REASON FOR CALL**this is important as we prioritize the call backs  [place] AS LONG AS YOU CALL BEFORE 4:00 PM  At the [facility], you and your health needs are our priority. As part of our continuing mission to provide you with exceptional heart care, we have created designated Provider Care Teams. These Care Teams include your primary Cardiologist (physician) and Advanced Practice Providers (APPs- Physician Assistants and Nurse Practitioners) who all work together to provide you with the care you need, when you need it.   You may see any of the following providers on your designated Care Team at your next follow up: Marland Kitchen Dr Glori Bickers . Dr [name] . Dr Vickki Muff . Darrick Grinder, NP . Lyda Jester, [place] . Audry Riles, PharmD   Please be sure to bring in all your medications bottles to every appointment.

## 2021-03-21 ENCOUNTER — Telehealth: Payer: Self-pay | Admitting: Hematology and Oncology

## 2021-03-21 NOTE — Telephone Encounter (Signed)
Called and spoke with patients daughter to inform her that provider will not be in office on 4/28 and we can change the provider for the appt. Mrs. [name] wanted to just delay the appt. Gave new date and time with originial provider, dr. Lorenso Courier

## 2021-03-22 ENCOUNTER — Inpatient Hospital Stay: Payer: Medicare Other

## 2021-03-22 ENCOUNTER — Inpatient Hospital Stay: Payer: Medicare Other | Admitting: Hematology and Oncology

## 2021-03-22 ENCOUNTER — Ambulatory Visit: Payer: Medicare Other | Admitting: Hematology and Oncology

## 2021-03-23 ENCOUNTER — Other Ambulatory Visit (HOSPITAL_COMMUNITY): Payer: Self-pay | Admitting: Cardiology

## 2021-03-23 ENCOUNTER — Other Ambulatory Visit (INDEPENDENT_AMBULATORY_CARE_PROVIDER_SITE_OTHER): Payer: Self-pay | Admitting: Primary Care

## 2021-03-30 ENCOUNTER — Telehealth: Payer: Self-pay | Admitting: Primary Care

## 2021-03-30 ENCOUNTER — Other Ambulatory Visit: Payer: Self-pay

## 2021-03-30 ENCOUNTER — Ambulatory Visit (INDEPENDENT_AMBULATORY_CARE_PROVIDER_SITE_OTHER): Payer: Medicare Other

## 2021-03-30 ENCOUNTER — Ambulatory Visit (INDEPENDENT_AMBULATORY_CARE_PROVIDER_SITE_OTHER): Payer: Medicare Other | Admitting: Primary Care

## 2021-03-30 ENCOUNTER — Encounter: Payer: Self-pay | Admitting: Primary Care

## 2021-03-30 VITALS — BP 90/58 | HR 57 | Temp 97.4°F | Ht 63.0 in | Wt 199.8 lb

## 2021-03-30 DIAGNOSIS — R0609 Other forms of dyspnea: Secondary | ICD-10-CM

## 2021-03-30 DIAGNOSIS — J90 Pleural effusion, not elsewhere classified: Secondary | ICD-10-CM

## 2021-03-30 DIAGNOSIS — G4734 Idiopathic sleep related nonobstructive alveolar hypoventilation: Secondary | ICD-10-CM

## 2021-03-30 DIAGNOSIS — R0600 Dyspnea, unspecified: Secondary | ICD-10-CM

## 2021-03-30 NOTE — H&P (View-Only) (Signed)
@Patient  ID: Melissa Adams, female    DOB: 15-Apr-1946, 75 y.o.   MRN: [id number]  Chief Complaint  Patient presents with  . Follow-up    Referring provider: [name], NP  HPI: 75 year old female, former smoker. PMH significant for chronic systolic HF, HTN, recurrent pleural effusion, ESRD on HD. Patient of Dr. Erin Fulling, last seen on 01/02/21.   Previous [facility] pulmonary encounter: 01/02/21 - Dr. Erin Fulling . Consult    Referred back to our office for pleural effusion. Had a thoracentesis done in December 2021 by Dr. Silas Flood. States she has doing well since last procedure. Does not feel like the fluid is completely back.    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   She was last seen in clinic on 10/12/20 by Dr. Silas Flood and underwent thoracentesis on 11/07/20 where 141mL of serosanguinous fluid was removed. The pleural fluid was lymphocyte predominant with an LDH of 141 and total protein of 3.4, albumin 2.2 with cytology negative for malignant cells.    She has had undergone 4 thoracenteses in the past year with pleural fluid studies concerning for an exudative effusion.   She was admitted 11/14/20 to 11/19/20 for GI bleeding and underwent her 4th thoracentesis during that stay.    She has progressive shortness of breath when the fluid re-accumulates. Today she feels well and is not experiencing any shortness of breath.   03/30/2021 - Interim hx Patient presents today for follow-up shortness of breath. Accompanied by her son. She gets HD MWF, last dialysis today. Shortness of breath worse the last three days. Associated fatigue and pain right side. Denies cough, chest tightness or wheezing. She has sleep apnea but did not tolerate CPAP. She is supposed to wear 2L oxygen at night but she moved recently and her  concentrator was picked up. She established with cardiology on 03/08/21. She is on Toprol XL 25mg  twice daily, Despite HD she is mildly overloaded. Recommend patient had more fluid dialysised off at HD. She will follow-up with cardiology in 6 months with echocardiogram.    Allergies  Allergen Reactions  . [employer]  . Shellfish Allergy Itching    Makes throat itch     Immunization History  Administered Date(s) Administered  . Fluad Quad(high Dose 65+) 08/12/2020  . Moderna Sars-Covid-2 Vaccination 12/28/2019, 01/25/2020  . Pneumococcal Conjugate-13 06/22/2020  . Tdap 06/22/2020    Past Medical History:  Diagnosis Date  . A-fib ([place])   . Allergies   . Arthritis   . Asthma   . Cardiomyopathy ([place])   . CHF (congestive heart failure) ([place])   . Chronic kidney disease   . Edema   . History of hemodialysis   . Hyperlipidemia   . Hypertension   . Pulmonary hypertension ([place])   . Sleep apnea   . Tricuspid regurgitation     Tobacco History: Social History   Tobacco Use  Smoking Status Former Smoker  . Types: Cigarettes  Smokeless Tobacco Never Used   Counseling given: Not Answered   Outpatient Medications Prior to Visit  Medication Sig Dispense Refill  . acetaminophen (TYLENOL) 325 MG tablet Take 2 tablets (650 mg total) by mouth every 4 (four) hours as needed for headache or mild pain.    Marland Kitchen albuterol (VENTOLIN HFA) 108 (90 Base) MCG/ACT inhaler Inhale  2 puffs into the lungs every 6 (six) hours as needed for wheezing or shortness of breath. 6.7 g 1  . allopurinol (ZYLOPRIM) 300 MG tablet TAKE 1 TABLET BY MOUTH EVERY NIGHT AT BEDTIME 30 tablet 3  . apixaban (ELIQUIS) 5 MG TABS tablet Take 1 tablet (5 mg total) by mouth 2 (two) times daily. 60 tablet 3  . atorvastatin (LIPITOR) 80 MG tablet TAKE 1 TABLET (80 MG TOTAL) BY MOUTH AT BEDTIME. 30 tablet 5  . B Complex-C-Zn-Folic Acid (DIALYVITE 517-OHYW 15) 0.8 MG TABS Take 1 tablet by mouth daily.    . cetirizine  (ZYRTEC) 10 MG tablet Take 10 mg by mouth daily as needed for allergies.    . cinacalcet (SENSIPAR) 30 MG tablet Take 30 mg by mouth daily with supper.     . esomeprazole (NEXIUM) 40 MG capsule Take 1 capsule (40 mg total) by mouth 2 (two) times daily before a meal. 30 capsule 1  . ferric citrate (AURYXIA) 1 GM 210 MG(Fe) tablet Take 210 mg by mouth 3 (three) times daily with meals.    . furosemide (LASIX) 40 MG tablet Take 80 mg by mouth See admin instructions. Take 2 tablets (80 mg) by mouth twice daily on non-dialysis days - Sunday, Tuesday, Thursday    . lidocaine-prilocaine (EMLA) cream Apply 1 application topically See admin instructions. Apply topically to port access one hour prior to dialysis - Monday, Wednesday, Friday, Saturday    . metoprolol succinate (TOPROL-XL) 25 MG 24 hr tablet TAKE 1 TABLET BY MOUTH EVERY MORNING & TAKE 1 TABLET EVERY NIGHT AT BEDTIME WITH OR IMMEDIATELY FOLLOWING MEAL 60 tablet 11  . midodrine (PROAMATINE) 10 MG tablet Take 1 tablet (10 mg total) by mouth daily. 30 tablet 0  . montelukast (SINGULAIR) 10 MG tablet Take 1 tablet by mouth once daily 90 tablet 0  . multivitamin (RENA-VIT) TABS tablet Take 1 tablet by mouth daily.    . Nutritional Supplements (,FEEDING SUPPLEMENT, PROSOURCE PLUS) liquid Take 30 mLs by mouth 2 (two) times daily between meals.    Marland Kitchen oxyCODONE-acetaminophen (PERCOCET) 5-325 MG tablet Take 1 tablet by mouth every 6 (six) hours as needed for severe pain. 8 tablet 0  . sucralfate (CARAFATE) 1 GM/10ML suspension Take 10 mLs (1 g total) by mouth 2 (two) times daily. 420 mL 1  . vitamin B-12 (CYANOCOBALAMIN) 1000 MCG tablet Take 1,000 mcg by mouth daily.    . vitamin C (VITAMIN C) 500 MG tablet Take 1 tablet (500 mg total) by mouth daily. 30 tablet 0   No facility-administered medications prior to visit.    Review of Systems  Review of Systems  Respiratory: Positive for shortness of breath. Negative for cough, chest tightness and wheezing.    Cardiovascular: Positive for leg swelling.    Physical Exam  BP (!) 90/58 (BP Location: Left Arm, Patient Position: Sitting, Cuff Size: Normal)   Pulse (!) 57   Temp (!) 97.4 F (36.3 C) (Temporal)   Ht 5\' 3"  (1.6 m)   Wt 199 lb 12.8 oz (90.6 kg)   SpO2 100%   BMI 35.39 kg/m  Physical Exam Constitutional:      Appearance: Normal appearance.  HENT:     Head: Normocephalic and atraumatic.  Cardiovascular:     Rate and Rhythm: Normal rate and regular rhythm.  Pulmonary:     Effort: Pulmonary effort is normal.     Breath sounds: No wheezing.     Comments: Right side diminished  Neurological:  General: No focal deficit present.     Mental Status: She is oriented to person, place, and time. Mental status is at baseline.  Psychiatric:        Mood and Affect: Mood normal.        Behavior: Behavior normal.        Thought Content: Thought content normal.        Judgment: Judgment normal.      Lab Results:  CBC    Component Value Date/Time   WBC 5.3 12/14/2020 1525   RBC 3.33 (L) 12/14/2020 1525   HGB 14.3 03/01/2021 0911   HGB 11.4 06/22/2020 1126   HCT 42.0 03/01/2021 0911   HCT 35.1 06/22/2020 1126   PLT 155 12/14/2020 1525   PLT 141 (L) 06/22/2020 1126   MCV 103.3 (H) 12/14/2020 1525   MCV 96 06/22/2020 1126   MCH 30.6 12/14/2020 1525   MCHC 29.7 (L) 12/14/2020 1525   RDW 17.9 (H) 12/14/2020 1525   RDW 14.9 06/22/2020 1126   LYMPHSABS 1.0 11/17/2020 0155   LYMPHSABS 1.4 06/22/2020 1126   MONOABS 1.1 (H) 11/17/2020 0155   EOSABS 0.0 11/17/2020 0155   EOSABS 0.0 06/22/2020 1126   BASOSABS 0.0 11/17/2020 0155   BASOSABS 0.0 06/22/2020 1126    BMET    Component Value Date/Time   NA 138 03/01/2021 0911   NA 141 06/22/2020 1126   K 3.9 03/01/2021 0911   CL 100 03/01/2021 0911   CO2 24 11/19/2020 0306   GLUCOSE 79 03/01/2021 0911   BUN 31 (H) 03/01/2021 0911   BUN 28 (H) 06/22/2020 1126   CREATININE 5.90 (H) 03/01/2021 0911   CALCIUM 7.2 (L)  11/19/2020 0306   GFRNONAA 8 (L) 11/19/2020 0306   GFRAA 9 (L) 07/01/2020 1735    BNP    Component Value Date/Time   BNP 3,465.4 (H) 06/10/2020 1638    ProBNP No results found for: PROBNP  Imaging: DG Chest 2 View  Result Date: 03/30/2021 CLINICAL DATA:  Shortness of breath, history of pleural effusion EXAM: CHEST - 2 VIEW COMPARISON:  11/18/2020 FINDINGS: Enlargement of cardiac silhouette with pulmonary vascular congestion. Atherosclerotic calcification aorta. Increased RIGHT pleural effusion and basilar atelectasis versus prior exam. Remaining lungs clear. No infiltrate or pneumothorax. Osseous demineralization with mild chronic superior endplate height loss of an upper thoracic vertebra. IMPRESSION: Enlargement of cardiac silhouette with pulmonary vascular congestion. Increased RIGHT pleural effusion and basilar atelectasis. Aortic Atherosclerosis (ICD10-I70.0). Electronically Signed   By: Lavonia Dana M.D.   On: 03/30/2021 16:16     Assessment & Plan:   Pleural effusion - Patient presents today with complaints of shortness of breath x 3 days. She has a hx recurrent pleural effusion. CXR 03/30/2021 showed right pleural effusion, increased from previous imaging in December 2021 after thoracentesis Clinically she appears stable. O2 100% RA. She is hypotensive d/t dialysis but appears to be consistent with her baseline. I discussed with Dr. Erin Fulling he has availability Friday May 13th to do thoracentesis at [facility]. If needed sooner we will need to get her set up with a different pulmonary provider through the procedural pool.  If shortness of breath worsens over the weekend needs ED evaluation    Martyn Ehrich, NP 04/02/2021

## 2021-03-30 NOTE — Telephone Encounter (Signed)
Patient of Dr. Erin Fulling, last seen in February for pleural effusion/ Presents today for worsen shortness of breath. CXR showed increased right pleural effusion.  Dr. Erin Fulling can you see her next week for thoracentesis in office or do you want to get her set up hospital thoracentesis with another provider?   CC: Lauren with procedural pool

## 2021-03-30 NOTE — Patient Instructions (Addendum)
Orders: ONO on RA   CXR showed right pleural effusion, increased from previous imaging in December after thoracentesis  I will reach out to Dr. Erin Fulling to see if he can get you in early next week for thoracentesis otherwise we will set up procedure in the hospital. If shortness of breath worsens over the weekend needs ED evaluation   Follow-up To be determined    Pleural Effusion Pleural effusion is an abnormal buildup of fluid in the layers of tissue between the lungs and the inside of the chest (pleural space). The two layers of tissue that line the lungs and the inside of the chest are called pleura. Usually, there is no air in the space between the pleura, only a thin layer of fluid. Some conditions can cause a large amount of fluid to build up, which can cause the lung to collapse if untreated. A pleural effusion is usually caused by another disease that requires treatment. What are the causes? Pleural effusion can be caused by:  Heart failure.  Certain infections, such as pneumonia or tuberculosis.  Cancer.  A blood clot in the lung (pulmonary embolism).  Complications from surgery, such as from open heart surgery.  Liver disease (cirrhosis).  Kidney disease. What are the signs or symptoms? In some cases, pleural effusion may cause no symptoms. If symptoms are present, they may include:  Shortness of breath, especially when lying down.  Chest pain. This may get worse when taking a deep breath.  Fever.  Dry, long-lasting (chronic) cough.  Hiccups.  Rapid breathing. An underlying condition that is causing the pleural effusion (such as heart failure, pneumonia, blood clots, tuberculosis, or cancer) may also cause other symptoms. How is this diagnosed? This condition may be diagnosed based on:  Your symptoms and medical history.  A physical exam.  A chest X-ray.  A procedure to use a needle to remove fluid from the pleural space (thoracentesis). This fluid is  tested.  Other imaging studies of the chest, such as ultrasound or CT scan. How is this treated? Depending on the cause of your condition, treatment may include:  Treating the underlying condition that is causing the effusion. When that condition improves, the effusion will also improve. Examples of treatment for underlying conditions include: ? Antibiotic medicines to treat an infection. ? Diuretics or other heart medicines to treat heart failure.  Thoracentesis.  Placing a thin flexible tube under your skin and into your chest to continuously drain the effusion (indwelling pleural catheter).  Surgery to remove the outer layer of tissue from the pleural space (decortication).  A procedure to put medicine into the chest cavity to seal the pleural space and prevent fluid buildup (pleurodesis).  Chemotherapy and radiation therapy, if you have cancerous (malignant) pleural effusion. These treatments are typically used to treat cancer. They kill certain cells in the body.   Follow these instructions at home:  Take over-the-counter and prescription medicines only as told by your health care provider.  Ask your health care provider what activities are safe for you.  Keep track of how long you are able to do mild exercise (such as walking) before you get short of breath. Write down this information to share with your health care provider. Your ability to exercise should improve over time.  Do not use any products that contain nicotine or tobacco, such as cigarettes and e-cigarettes. If you need help quitting, ask your health care provider.  Keep all follow-up visits as told by your health care  provider. This is important. Contact a health care provider if:  The amount of time that you are able to do mild exercise: ? Decreases. ? Does not improve with time.  You have a fever. Get help right away if:  You are short of breath.  You develop chest pain.  You develop a new  cough. Summary  Pleural effusion is an abnormal buildup of fluid in the layers of tissue between the lungs and the inside of the chest.  Pleural effusion can have many causes, including heart failure, pulmonary embolism, infections, or cancer.  Symptoms of pleural effusion can include shortness of breath, chest pain, fever, long-lasting (chronic) cough, hiccups, or rapid breathing.  Diagnosis often involves making images of the chest (such as with ultrasound or X-ray) and removing fluid (thoracentesis) to send for testing.  Treatment for pleural effusion depends on what underlying condition is causing it. This information is not intended to replace advice given to you by your health care provider. Make sure you discuss any questions you have with your health care provider. Document Revised: 08/11/2020 Document Reviewed: 07/13/2020 Elsevier Patient Education  2021 [employer].

## 2021-03-30 NOTE — Progress Notes (Signed)
@Patient  ID: Melissa Adams, female    DOB: August 27, 1946, 75 y.o.   MRN: [id number]  Chief Complaint  Patient presents with  . Follow-up    Referring provider: [name], NP  HPI: 75 year old female, former smoker. PMH significant for chronic systolic HF, HTN, recurrent pleural effusion, ESRD on HD. Patient of Dr. Erin Fulling, last seen on 01/02/21.   Previous [facility] pulmonary encounter: 01/02/21 - Dr. Erin Fulling . Consult    Referred back to our office for pleural effusion. Had a thoracentesis done in December 2021 by Dr. Silas Flood. States she has doing well since last procedure. Does not feel like the fluid is completely back.    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   She was last seen in clinic on 10/12/20 by Dr. Silas Flood and underwent thoracentesis on 11/07/20 where 1434mL of serosanguinous fluid was removed. The pleural fluid was lymphocyte predominant with an LDH of 141 and total protein of 3.4, albumin 2.2 with cytology negative for malignant cells.    She has had undergone 4 thoracenteses in the past year with pleural fluid studies concerning for an exudative effusion.   She was admitted 11/14/20 to 11/19/20 for GI bleeding and underwent her 4th thoracentesis during that stay.    She has progressive shortness of breath when the fluid re-accumulates. Today she feels well and is not experiencing any shortness of breath.   03/30/2021 - Interim hx Patient presents today for follow-up shortness of breath. Accompanied by her son. She gets HD MWF, last dialysis today. Shortness of breath worse the last three days. Associated fatigue and pain right side. Denies cough, chest tightness or wheezing. She has sleep apnea but did not tolerate CPAP. She is supposed to wear 2L oxygen at night but she moved recently and her  concentrator was picked up. She established with cardiology on 03/08/21. She is on Toprol XL 25mg  twice daily, Despite HD she is mildly overloaded. Recommend patient had more fluid dialysised off at HD. She will follow-up with cardiology in 6 months with echocardiogram.    Allergies  Allergen Reactions  . [employer]  . Shellfish Allergy Itching    Makes throat itch     Immunization History  Administered Date(s) Administered  . Fluad Quad(high Dose 65+) 08/12/2020  . Moderna Sars-Covid-2 Vaccination 12/28/2019, 01/25/2020  . Pneumococcal Conjugate-13 06/22/2020  . Tdap 06/22/2020    Past Medical History:  Diagnosis Date  . A-fib ([place])   . Allergies   . Arthritis   . Asthma   . Cardiomyopathy ([place])   . CHF (congestive heart failure) ([place])   . Chronic kidney disease   . Edema   . History of hemodialysis   . Hyperlipidemia   . Hypertension   . Pulmonary hypertension ([place])   . Sleep apnea   . Tricuspid regurgitation     Tobacco History: Social History   Tobacco Use  Smoking Status Former Smoker  . Types: Cigarettes  Smokeless Tobacco Never Used   Counseling given: Not Answered   Outpatient Medications Prior to Visit  Medication Sig Dispense Refill  . acetaminophen (TYLENOL) 325 MG tablet Take 2 tablets (650 mg total) by mouth every 4 (four) hours as needed for headache or mild pain.    Marland Kitchen albuterol (VENTOLIN HFA) 108 (90 Base) MCG/ACT inhaler Inhale  2 puffs into the lungs every 6 (six) hours as needed for wheezing or shortness of breath. 6.7 g 1  . allopurinol (ZYLOPRIM) 300 MG tablet TAKE 1 TABLET BY MOUTH EVERY NIGHT AT BEDTIME 30 tablet 3  . apixaban (ELIQUIS) 5 MG TABS tablet Take 1 tablet (5 mg total) by mouth 2 (two) times daily. 60 tablet 3  . atorvastatin (LIPITOR) 80 MG tablet TAKE 1 TABLET (80 MG TOTAL) BY MOUTH AT BEDTIME. 30 tablet 5  . B Complex-C-Zn-Folic Acid (DIALYVITE 546-TKPT 15) 0.8 MG TABS Take 1 tablet by mouth daily.    . cetirizine  (ZYRTEC) 10 MG tablet Take 10 mg by mouth daily as needed for allergies.    . cinacalcet (SENSIPAR) 30 MG tablet Take 30 mg by mouth daily with supper.     . esomeprazole (NEXIUM) 40 MG capsule Take 1 capsule (40 mg total) by mouth 2 (two) times daily before a meal. 30 capsule 1  . ferric citrate (AURYXIA) 1 GM 210 MG(Fe) tablet Take 210 mg by mouth 3 (three) times daily with meals.    . furosemide (LASIX) 40 MG tablet Take 80 mg by mouth See admin instructions. Take 2 tablets (80 mg) by mouth twice daily on non-dialysis days - Sunday, Tuesday, Thursday    . lidocaine-prilocaine (EMLA) cream Apply 1 application topically See admin instructions. Apply topically to port access one hour prior to dialysis - Monday, Wednesday, Friday, Saturday    . metoprolol succinate (TOPROL-XL) 25 MG 24 hr tablet TAKE 1 TABLET BY MOUTH EVERY MORNING & TAKE 1 TABLET EVERY NIGHT AT BEDTIME WITH OR IMMEDIATELY FOLLOWING MEAL 60 tablet 11  . midodrine (PROAMATINE) 10 MG tablet Take 1 tablet (10 mg total) by mouth daily. 30 tablet 0  . montelukast (SINGULAIR) 10 MG tablet Take 1 tablet by mouth once daily 90 tablet 0  . multivitamin (RENA-VIT) TABS tablet Take 1 tablet by mouth daily.    . Nutritional Supplements (,FEEDING SUPPLEMENT, PROSOURCE PLUS) liquid Take 30 mLs by mouth 2 (two) times daily between meals.    Marland Kitchen oxyCODONE-acetaminophen (PERCOCET) 5-325 MG tablet Take 1 tablet by mouth every 6 (six) hours as needed for severe pain. 8 tablet 0  . sucralfate (CARAFATE) 1 GM/10ML suspension Take 10 mLs (1 g total) by mouth 2 (two) times daily. 420 mL 1  . vitamin B-12 (CYANOCOBALAMIN) 1000 MCG tablet Take 1,000 mcg by mouth daily.    . vitamin C (VITAMIN C) 500 MG tablet Take 1 tablet (500 mg total) by mouth daily. 30 tablet 0   No facility-administered medications prior to visit.    Review of Systems  Review of Systems  Respiratory: Positive for shortness of breath. Negative for cough, chest tightness and wheezing.    Cardiovascular: Positive for leg swelling.    Physical Exam  BP (!) 90/58 (BP Location: Left Arm, Patient Position: Sitting, Cuff Size: Normal)   Pulse (!) 57   Temp (!) 97.4 F (36.3 C) (Temporal)   Ht 5\' 3"  (1.6 m)   Wt 199 lb 12.8 oz (90.6 kg)   SpO2 100%   BMI 35.39 kg/m  Physical Exam Constitutional:      Appearance: Normal appearance.  HENT:     Head: Normocephalic and atraumatic.  Cardiovascular:     Rate and Rhythm: Normal rate and regular rhythm.  Pulmonary:     Effort: Pulmonary effort is normal.     Breath sounds: No wheezing.     Comments: Right side diminished  Neurological:  General: No focal deficit present.     Mental Status: She is oriented to person, place, and time. Mental status is at baseline.  Psychiatric:        Mood and Affect: Mood normal.        Behavior: Behavior normal.        Thought Content: Thought content normal.        Judgment: Judgment normal.      Lab Results:  CBC    Component Value Date/Time   WBC 5.3 12/14/2020 1525   RBC 3.33 (L) 12/14/2020 1525   HGB 14.3 03/01/2021 0911   HGB 11.4 06/22/2020 1126   HCT 42.0 03/01/2021 0911   HCT 35.1 06/22/2020 1126   PLT 155 12/14/2020 1525   PLT 141 (L) 06/22/2020 1126   MCV 103.3 (H) 12/14/2020 1525   MCV 96 06/22/2020 1126   MCH 30.6 12/14/2020 1525   MCHC 29.7 (L) 12/14/2020 1525   RDW 17.9 (H) 12/14/2020 1525   RDW 14.9 06/22/2020 1126   LYMPHSABS 1.0 11/17/2020 0155   LYMPHSABS 1.4 06/22/2020 1126   MONOABS 1.1 (H) 11/17/2020 0155   EOSABS 0.0 11/17/2020 0155   EOSABS 0.0 06/22/2020 1126   BASOSABS 0.0 11/17/2020 0155   BASOSABS 0.0 06/22/2020 1126    BMET    Component Value Date/Time   NA 138 03/01/2021 0911   NA 141 06/22/2020 1126   K 3.9 03/01/2021 0911   CL 100 03/01/2021 0911   CO2 24 11/19/2020 0306   GLUCOSE 79 03/01/2021 0911   BUN 31 (H) 03/01/2021 0911   BUN 28 (H) 06/22/2020 1126   CREATININE 5.90 (H) 03/01/2021 0911   CALCIUM 7.2 (L)  11/19/2020 0306   GFRNONAA 8 (L) 11/19/2020 0306   GFRAA 9 (L) 07/01/2020 1735    BNP    Component Value Date/Time   BNP 3,465.4 (H) 06/10/2020 1638    ProBNP No results found for: PROBNP  Imaging: DG Chest 2 View  Result Date: 03/30/2021 CLINICAL DATA:  Shortness of breath, history of pleural effusion EXAM: CHEST - 2 VIEW COMPARISON:  11/18/2020 FINDINGS: Enlargement of cardiac silhouette with pulmonary vascular congestion. Atherosclerotic calcification aorta. Increased RIGHT pleural effusion and basilar atelectasis versus prior exam. Remaining lungs clear. No infiltrate or pneumothorax. Osseous demineralization with mild chronic superior endplate height loss of an upper thoracic vertebra. IMPRESSION: Enlargement of cardiac silhouette with pulmonary vascular congestion. Increased RIGHT pleural effusion and basilar atelectasis. Aortic Atherosclerosis (ICD10-I70.0). Electronically Signed   By: Lavonia Dana M.D.   On: 03/30/2021 16:16     Assessment & Plan:   Pleural effusion - Patient presents today with complaints of shortness of breath x 3 days. She has a hx recurrent pleural effusion. CXR 03/30/2021 showed right pleural effusion, increased from previous imaging in December 2021 after thoracentesis Clinically she appears stable. O2 100% RA. She is hypotensive d/t dialysis but appears to be consistent with her baseline. I discussed with Dr. Erin Fulling he has availability Friday May 13th to do thoracentesis at [facility]. If needed sooner we will need to get her set up with a different pulmonary provider through the procedural pool.  If shortness of breath worsens over the weekend needs ED evaluation    Martyn Ehrich, NP 04/02/2021

## 2021-04-02 ENCOUNTER — Encounter: Payer: Self-pay | Admitting: Primary Care

## 2021-04-02 NOTE — Telephone Encounter (Signed)
I can call Vista Lawman with Endo and see what is available for Friday.  Or once there has been discussion with another provider please let me know and I will do my best to get this set up for patient. She will need covid testing prior to procedure done in Hospital.

## 2021-04-02 NOTE — Telephone Encounter (Signed)
Can you please call patient and let her know I spoke with Dr. Erin Fulling and he told me that he will be at [facility] on Friday if she wanted to have a thoracentesis done with him in the afternoon.  If her breathing and O2 sats are stable I would have her schedule with Dr. Erin Fulling through our procedural pool. Otherwise if she wants the procedure sooner, she will need to be scheduled with another provider

## 2021-04-02 NOTE — Assessment & Plan Note (Addendum)
-   Patient presents today with complaints of shortness of breath x 3 days. She has a hx recurrent pleural effusion. CXR 03/30/2021 showed right pleural effusion, increased from previous imaging in December 2021 after thoracentesis Clinically she appears stable. O2 100% RA. She is hypotensive d/t dialysis but appears to be consistent with her baseline. I discussed with Dr. Erin Fulling he has availability Friday May 13th to do thoracentesis at [facility]. If needed sooner we will need to get her set up with a different pulmonary provider through the procedural pool.  If shortness of breath worsens over the weekend needs ED evaluation

## 2021-04-02 NOTE — Telephone Encounter (Signed)
Called spoke with Charge nurse in Endo at [facility].  Patient is scheduled for Friday 13, 2022 for thoracentesis. Dr. Erin Fulling asked for serum LHD, total protein and Albumin labs be ordered to be drawn prior to procedure in endo. Orders placed.  Called spoke with [name] per DPR to share all information with her. Covid test set up for Wednesday afternoon after patient completes her dialysis treatment.  Nothing further needed at this time

## 2021-04-02 NOTE — Telephone Encounter (Signed)
Hi Beth,   I am booked up this week for a thoracentesis in the clinic so I would recommend she be scheduled with one of the providers on the inpatient side.   Thanks, Wille Glaser

## 2021-04-02 NOTE — Telephone Encounter (Signed)
Called spoke with Melissa Adams, patient is on Eliquis. Dr. Erin Fulling states to hold starting Wednesday.  Melissa Adams voiced back  Nothing further needed at this time.

## 2021-04-02 NOTE — Telephone Encounter (Signed)
Called pt and spoke with daughter Melissa Adams (on [place]) letting her know the info stated by [facility] after she spoke with Dr. Erin Fulling about having thoracentesis performed.  Melissa Adams said that Friday would be fine with them for pt to have thoracentesis performed by Dr. Erin Fulling himself.  Stated to her that we would call them with all info for appt for thoracentesis including covid test and she verbalized understanding.  Routing to procedure pool so they can see what they can do in regards to getting pt scheduled for thoracentesis with Dr. Erin Fulling Friday, 5/13.

## 2021-04-04 ENCOUNTER — Other Ambulatory Visit (HOSPITAL_COMMUNITY)
Admission: RE | Admit: 2021-04-04 | Discharge: 2021-04-04 | Disposition: A | Discharge status: Home / Self Care | Payer: Medicare Other | Source: Ambulatory Visit | Attending: Pulmonary Disease | Admitting: Pulmonary Disease

## 2021-04-04 DIAGNOSIS — Z20822 Contact with and (suspected) exposure to covid-19: Secondary | ICD-10-CM | POA: Diagnosis not present

## 2021-04-04 DIAGNOSIS — Z01812 Encounter for preprocedural laboratory examination: Secondary | ICD-10-CM | POA: Insufficient documentation

## 2021-04-04 LAB — SARS CORONAVIRUS 2 (TAT 6-24 HRS): SARS Coronavirus 2: NEGATIVE

## 2021-04-05 ENCOUNTER — Ambulatory Visit (INDEPENDENT_AMBULATORY_CARE_PROVIDER_SITE_OTHER): Payer: Medicare Other | Admitting: Gastroenterology

## 2021-04-05 ENCOUNTER — Other Ambulatory Visit: Payer: Self-pay

## 2021-04-05 ENCOUNTER — Encounter: Payer: Self-pay | Admitting: Gastroenterology

## 2021-04-05 ENCOUNTER — Other Ambulatory Visit (INDEPENDENT_AMBULATORY_CARE_PROVIDER_SITE_OTHER): Payer: Medicare Other

## 2021-04-05 VITALS — BP 112/64 | HR 82 | Ht 63.0 in | Wt 201.4 lb

## 2021-04-05 DIAGNOSIS — E340 Carcinoid syndrome: Secondary | ICD-10-CM | POA: Diagnosis not present

## 2021-04-05 DIAGNOSIS — K269 Duodenal ulcer, unspecified as acute or chronic, without hemorrhage or perforation: Secondary | ICD-10-CM

## 2021-04-05 DIAGNOSIS — E3409 Other carcinoid syndrome: Secondary | ICD-10-CM

## 2021-04-05 DIAGNOSIS — J948 Other specified pleural conditions: Secondary | ICD-10-CM

## 2021-04-05 DIAGNOSIS — K7469 Other cirrhosis of liver: Secondary | ICD-10-CM | POA: Diagnosis not present

## 2021-04-05 NOTE — Progress Notes (Signed)
P  Chief Complaint:    Cirrhosis follow-up  GI History: 75 year old female with a history of arthritis, asthma, chronic hypotension, HLD, cardiomyopathy, CHF with EF 30-35%, mitral and tricuspid valve regurgitation, A. fib (on Eliquis), pulmonary hypertension, CKD/ESRD on HD, OSA (wears 2 L at night), thrombocytopenia.  She follows in the GI clinic for Hepatic cirrhosis, small bowel AVMs, duodenal carcinoid.   Admitted to [facility] in 05/2020 with recurrent right pleural effusion and generalized abdominal pain along with cirrhosis and upper abdominal ascites, suspected secondary to cardiomyopathy/cardiogenic cirrhosis.  Has had multiple thoracentesis completed.  Hospitalized 12/20-26, 2021 with recurrent right pleural effusion, melena, acute blood loss anemia (Hgb 5.7 from baseline 11.7) with hypovolemic shock with extensive evaluation as below: -Transfuse 2 unit PRBCs -Received Kcentra to reverse Eliquis anticoagulation -EGD 12/21: No active bleeding, benign-appearing duodenal nodule (carcinoid) -Colonoscopy 12/22: Polyps x6 (tubular adenomas) in the cecum, ascending colon, transverse colon.  Congested mucosa at IC valve, old blood clots noted exiting ileum into the cecum.  No active bleeding -Video capsule endoscopy 12/23: Duodenal nodule (carcinoid), mid small bowel erosions with mild inflammatory changes with one red spot but no edema or active bleeding.  Prep limited in ileum -CT abdomen/pelvis 12/24: Large right pleural effusion, no significant abdominal pathology.  Interestingly, liver reported to be normal  -Right-sided thoracentesis -Treated with amiodarone for rate control -TTE with EF 30-35% -Discharged with midodrine for chronic hypotension  - 01/2021: EUS with EMR of 10 mm duodenal carcinoid.  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  CT 10/2020.  Repeat study ordered today - Variceal screening: EGD 10/2020.  No varices - Serologic evaluation: Completed 08/2020-negative/normal - Viral hepatitis vaccination: Needs HAV/HBV vaccine series - Flu vaccine: UTD - Liver biopsy: None - Medications: Midodrine, Lasix, zinc - MELD: Repeating labs today to calculate - Child Pugh score:Repeating labs today to calculate   Additionally, history of small bowel AVMs complicated by need for systemic anticoagulation.   HPI:     Patient is a 75 y.o. female presenting to the Gastroenterology Clinic for follow-up.  Last seen by me on 12/19/2020 and at that time was referred to Dr. Rush Landmark for EUS with EMR of the duodenal carcinoid, which was completed in 01/2021.  She presents today for ongoing management of cirrhosis.  Was seen in the pulmonary clinic on 03/30/2021 with plan for thoracentesis tomorrow. Has upcoming appt in Cardiology Clinic.   Otherwise no complaints today. Good PO intake, maintaining low Na diet.     Review of systems:     No chest pain, no SOB, no fevers, no urinary sx   Past Medical History:  Diagnosis Date  . A-fib ([place])   . Allergies   . Arthritis   . Asthma   . Cardiomyopathy ([place])   . CHF (congestive heart failure) ([place])   . Chronic kidney disease   . Edema   . History of hemodialysis   . Hyperlipidemia   . Hypertension   . Pulmonary hypertension ([place])   . Sleep apnea   . Tricuspid regurgitation     Patient's surgical history, family medical history, social history, medications and allergies were all reviewed in Epic    Current Outpatient Medications  Medication Sig Dispense Refill  . acetaminophen (TYLENOL) 325 MG tablet Take 2 tablets (650 mg total) by mouth every 4 (four) hours as needed for headache or  mild pain.    Marland Kitchen albuterol (VENTOLIN HFA) 108 (90 Base) MCG/ACT inhaler Inhale 2 puffs into the lungs every 6 (six) hours as needed for wheezing or shortness of breath. 6.7 g 1   . allopurinol (ZYLOPRIM) 300 MG tablet TAKE 1 TABLET BY MOUTH EVERY NIGHT AT BEDTIME (Patient taking differently: Take 300 mg by mouth at bedtime.) 30 tablet 3  . apixaban (ELIQUIS) 5 MG TABS tablet Take 1 tablet (5 mg total) by mouth 2 (two) times daily. 60 tablet 3  . atorvastatin (LIPITOR) 80 MG tablet TAKE 1 TABLET (80 MG TOTAL) BY MOUTH AT BEDTIME. 30 tablet 5  . B Complex-C-Zn-Folic Acid (DIALYVITE 161-WRUE 15) 0.8 MG TABS Take 1 tablet by mouth daily.    . cetirizine (ZYRTEC) 10 MG tablet Take 10 mg by mouth daily as needed for allergies.    . cinacalcet (SENSIPAR) 30 MG tablet Take 30 mg by mouth daily with supper.     . esomeprazole (NEXIUM) 40 MG capsule Take 1 capsule (40 mg total) by mouth 2 (two) times daily before a meal. 30 capsule 1  . ferric citrate (AURYXIA) 1 GM 210 MG(Fe) tablet Take 210 mg by mouth 3 (three) times daily with meals.    . furosemide (LASIX) 40 MG tablet Take 80 mg by mouth See admin instructions. Take 2 tablets (80 mg) by mouth twice daily on non-dialysis days - Sunday, Tuesday, Thursday    . lidocaine-prilocaine (EMLA) cream Apply 1 application topically See admin instructions. Apply topically to port access one hour prior to dialysis - Monday, Wednesday, Friday, Saturday    . metoprolol succinate (TOPROL-XL) 25 MG 24 hr tablet TAKE 1 TABLET BY MOUTH EVERY MORNING & TAKE 1 TABLET EVERY NIGHT AT BEDTIME WITH OR IMMEDIATELY FOLLOWING MEAL (Patient taking differently: Take 25 mg by mouth in the morning and at bedtime.) 60 tablet 11  . midodrine (PROAMATINE) 10 MG tablet Take 1 tablet (10 mg total) by mouth daily. 30 tablet 0  . montelukast (SINGULAIR) 10 MG tablet Take 1 tablet by mouth once daily (Patient taking differently: Take 10 mg by mouth daily as needed (allergies).) 90 tablet 0  . Multiple Vitamins-Minerals (MULTIVITAMIN WITH MINERALS) tablet Take 1 tablet by mouth daily.    . Nutritional Supplements (,FEEDING SUPPLEMENT, PROSOURCE PLUS) liquid Take 30 mLs  by mouth 2 (two) times daily between meals.    Marland Kitchen oxyCODONE-acetaminophen (PERCOCET) 5-325 MG tablet Take 1 tablet by mouth every 6 (six) hours as needed for severe pain. 8 tablet 0  . sucralfate (CARAFATE) 1 GM/10ML suspension Take 10 mLs (1 g total) by mouth 2 (two) times daily. 420 mL 1  . vitamin B-12 (CYANOCOBALAMIN) 1000 MCG tablet Take 1,000 mcg by mouth daily.    . vitamin C (VITAMIN C) 500 MG tablet Take 1 tablet (500 mg total) by mouth daily. 30 tablet 0   No current facility-administered medications for this visit.    Physical Exam:     Pulse 82   Ht 5\' 3"  (1.6 m)   Wt 201 lb 6 oz (91.3 kg)   BMI 35.67 kg/m   GENERAL:  Pleasant female in NAD PSYCH: : Cooperative, normal affect NEURO: Alert and oriented x 3, no focal neurologic deficits   IMPRESSION and PLAN:    1) Cardiogenic cirrhosis 2) Hepatic hydrothorax  -Hepatoma screening--ordered RUQ [place] and AFP.  Patient is claustrophobia, so cannot perform MRI -Check CMP, CBC, PT/INR with calculation of MELD.  Okay to check labs in 4 weeks  when already check chromogranin A at that time -Hepatic encephalopathy-  no evidence of hepatic encephalopathy - Continue Lasix and hemodialysis per Nephrology - Thoracentesis scheduled for tomorrow with Pulmonary - Continue follow-up with pulmonary and cardiology clinics -Nutrition- high-protein diet from primarily plant-based diet.  Avoid red meat.  No raw or undercooked meat, seafood, or shellfish.  Low-fat/cholesterol/carbohydrate diet. -Discussed low-sodium diet-limit sodium to 2 g/day. -Continue daily multivitamin   3) Duodenal carcinoid -s/p EUS with EMR in 01/2021 - Check chromogranin A 4 weeks after completion of high-dose PPI - Repeat EGD/EUS in 01/2022 for carcinoid surveillance  4) Duodenal ulcers - Complete course of high-dose PPI and Carafate as prescribed  RTC in 6 months or sooner as needed  I spent 40 minutes of time, including in depth chart review, independent  review of results as outlined above, communicating results with the patient directly, face-to-face time with the patient, coordinating care, ordering studies and medications as appropriate, and documentation.           [place] ,DO, FACG 04/05/2021, 9:01 AM

## 2021-04-05 NOTE — Patient Instructions (Signed)
If you are age 75 or older, your body mass index should be between 23-30. Your Body mass index is 35.67 kg/m. If this is out of the aforementioned range listed, please consider follow up with your Primary Care Provider.  If you are age 59 or younger, your body mass index should be between 19-25. Your Body mass index is 35.67 kg/m. If this is out of the aformentioned range listed, please consider follow up with your Primary Care Provider.   You have been scheduled for an abdominal ultrasound at 05/10/2021 on 9:00am. Please arrive 15 minutes prior to your appointment for registration. Make certain not to have anything to eat or drink 6 hours prior to your appointment. Should you need to reschedule your appointment, please contact radiology at [phone]. This test typically takes about 30 minutes to perform  Please go to the 2nd floor of this building today and schedule your labwork for 4 weeks, [facility], [address].  Due to recent changes in healthcare laws, you may see the results of your imaging and laboratory studies on MyChart before your provider has had a chance to review them.  We understand that in some cases there may be results that are confusing or concerning to you. Not all laboratory results come back in the same time frame and the provider may be waiting for multiple results in order to interpret others.  Please give [place] 48 hours in order for your provider to thoroughly review all the results before contacting the office for clarification of your results.   Thank you for choosing me and [facility].  Vito Cirigliano, D.O.

## 2021-04-05 NOTE — Addendum Note (Signed)
Addended by: Manuela Schwartz on: 04/05/2021 10:11 AM   Modules accepted: Orders

## 2021-04-06 ENCOUNTER — Ambulatory Visit (HOSPITAL_COMMUNITY): Payer: Medicare Other

## 2021-04-06 ENCOUNTER — Ambulatory Visit: Payer: Medicare Other | Admitting: Primary Care

## 2021-04-06 ENCOUNTER — Encounter (HOSPITAL_COMMUNITY)
Admission: RE | Disposition: A | Discharge status: Home / Self Care | Payer: Self-pay | Source: Home / Self Care | Attending: Pulmonary Disease

## 2021-04-06 ENCOUNTER — Ambulatory Visit (HOSPITAL_COMMUNITY)
Admission: RE | Admit: 2021-04-06 | Discharge: 2021-04-06 | Disposition: A | Discharge status: Home / Self Care | Payer: Medicare Other | Attending: Pulmonary Disease | Admitting: Pulmonary Disease

## 2021-04-06 DIAGNOSIS — Z87891 Personal history of nicotine dependence: Secondary | ICD-10-CM | POA: Insufficient documentation

## 2021-04-06 DIAGNOSIS — N186 End stage renal disease: Secondary | ICD-10-CM | POA: Diagnosis not present

## 2021-04-06 DIAGNOSIS — Z9889 Other specified postprocedural states: Secondary | ICD-10-CM

## 2021-04-06 DIAGNOSIS — J90 Pleural effusion, not elsewhere classified: Secondary | ICD-10-CM | POA: Diagnosis present

## 2021-04-06 DIAGNOSIS — K761 Chronic passive congestion of liver: Secondary | ICD-10-CM | POA: Diagnosis not present

## 2021-04-06 DIAGNOSIS — Z992 Dependence on renal dialysis: Secondary | ICD-10-CM | POA: Diagnosis not present

## 2021-04-06 DIAGNOSIS — I509 Heart failure, unspecified: Secondary | ICD-10-CM | POA: Diagnosis not present

## 2021-04-06 DIAGNOSIS — I132 Hypertensive heart and chronic kidney disease with heart failure and with stage 5 chronic kidney disease, or end stage renal disease: Secondary | ICD-10-CM | POA: Insufficient documentation

## 2021-04-06 DIAGNOSIS — Z91013 Allergy to seafood: Secondary | ICD-10-CM | POA: Insufficient documentation

## 2021-04-06 DIAGNOSIS — Z91018 Allergy to other foods: Secondary | ICD-10-CM | POA: Insufficient documentation

## 2021-04-06 DIAGNOSIS — Z79899 Other long term (current) drug therapy: Secondary | ICD-10-CM | POA: Insufficient documentation

## 2021-04-06 DIAGNOSIS — Z7901 Long term (current) use of anticoagulants: Secondary | ICD-10-CM | POA: Insufficient documentation

## 2021-04-06 HISTORY — PX: THORACENTESIS: SHX235

## 2021-04-06 LAB — LACTATE DEHYDROGENASE, PLEURAL OR PERITONEAL FLUID: LD, Fluid: 94 U/L — ABNORMAL HIGH (ref 3–23)

## 2021-04-06 LAB — PROTEIN, PLEURAL OR PERITONEAL FLUID: Total protein, fluid: <3 g/dL

## 2021-04-06 LAB — GLUCOSE, PLEURAL OR PERITONEAL FLUID: Glucose, Fluid: 91 mg/dL

## 2021-04-06 LAB — ALBUMIN, PLEURAL OR PERITONEAL FLUID: Albumin, Fluid: 2 g/dL

## 2021-04-06 LAB — AMYLASE, PLEURAL OR PERITONEAL FLUID: Amylase, Fluid: 45 U/L

## 2021-04-06 LAB — PROTEIN, TOTAL: Total Protein: 6.5 g/dL (ref 6.5–8.1)

## 2021-04-06 LAB — BODY FLUID CELL COUNT WITH DIFFERENTIAL
Eos, Fluid: 0 %
Lymphs, Fluid: 85 %
Monocyte-Macrophage-Serous Fluid: 13 % — ABNORMAL LOW (ref 50–90)
Neutrophil Count, Fluid: 2 % (ref 0–25)
Total Nucleated Cell Count, Fluid: 500 cu mm (ref 0–1000)

## 2021-04-06 LAB — ALBUMIN: Albumin: 3.6 g/dL (ref 3.5–5.0)

## 2021-04-06 LAB — LACTATE DEHYDROGENASE: LDH: 169 U/L (ref 98–192)

## 2021-04-06 SURGERY — THORACENTESIS

## 2021-04-06 MED ORDER — LIDOCAINE HCL (PF) 1 % IJ SOLN
INTRAMUSCULAR | Status: AC
  Filled 2021-04-06: qty 30

## 2021-04-06 NOTE — CV Procedure (Signed)
Thoracentesis  Procedure Note  Melissa Adams  [id number]  03/23/1946  Date:04/06/21  Time:2:52 PM   Provider Performing:Dionisia Pacholski B Ladine Kiper   Procedure: Thoracentesis with imaging guidance (27741)  Indication(s) Pleural Effusion  Consent Risks of the procedure as well as the alternatives and risks of each were explained to the patient and/or caregiver.  Consent for the procedure was obtained and is signed in the bedside chart  Anesthesia Topical only with 1% lidocaine    Time Out Verified patient identification, verified procedure, site/side was marked, verified correct patient position, special equipment/implants available, medications/allergies/relevant history reviewed, required imaging and test results available.   Sterile Technique Maximal sterile technique including full sterile barrier drape, hand hygiene, sterile gown, sterile gloves, mask, hair covering, sterile ultrasound probe cover (if used).  Procedure Description Ultrasound was used to identify appropriate pleural anatomy for placement and overlying skin marked.  Area of drainage cleaned and draped in sterile fashion. Lidocaine was used to anesthetize the skin and subcutaneous tissue.  1300 cc's of cloudy brownish/yellow appearing fluid was drained from the right pleural space. Catheter then removed and bandaid applied to site.   Complications/Tolerance None; patient tolerated the procedure well. Chest X-ray is ordered to confirm no post-procedural complication.   EBL Minimal   Specimen(s) Pleural fluid

## 2021-04-07 NOTE — Interval H&P Note (Signed)
History and Physical Interval Note:  04/07/2021 4:00 PM  [name]  has presented today for surgery, with the diagnosis of pleural effusion.  The various methods of treatment have been discussed with the patient and family. After consideration of risks, benefits and other options for treatment, the patient has consented to  Procedure(s): THORACENTESIS (N/A) as a surgical intervention.  The patient's history has been reviewed, patient examined, no change in status, stable for surgery.  I have reviewed the patient's chart and labs.  Questions were answered to the patient's satisfaction.     Melissa Adams

## 2021-04-08 ENCOUNTER — Encounter (HOSPITAL_COMMUNITY): Payer: Self-pay | Admitting: Pulmonary Disease

## 2021-04-08 LAB — PH, BODY FLUID: pH, Body Fluid: 8

## 2021-04-08 LAB — TRIGLYCERIDES, BODY FLUIDS: Triglycerides, Fluid: 15 mg/dL

## 2021-04-09 LAB — CYTOLOGY - NON PAP

## 2021-04-11 LAB — CHOLESTEROL, BODY FLUID: Cholesterol, Fluid: 48 mg/dL

## 2021-04-12 ENCOUNTER — Other Ambulatory Visit: Payer: Self-pay

## 2021-04-12 ENCOUNTER — Inpatient Hospital Stay: Payer: Medicare Other | Attending: Hematology and Oncology | Admitting: Hematology and Oncology

## 2021-04-12 ENCOUNTER — Encounter: Payer: Self-pay | Admitting: Hematology and Oncology

## 2021-04-12 ENCOUNTER — Inpatient Hospital Stay: Payer: Medicare Other

## 2021-04-12 VITALS — BP 92/54 | HR 66 | Temp 97.8°F | Resp 18 | Ht 63.0 in | Wt 197.9 lb

## 2021-04-12 DIAGNOSIS — D3A8 Other benign neuroendocrine tumors: Secondary | ICD-10-CM | POA: Diagnosis not present

## 2021-04-12 DIAGNOSIS — I132 Hypertensive heart and chronic kidney disease with heart failure and with stage 5 chronic kidney disease, or end stage renal disease: Secondary | ICD-10-CM | POA: Diagnosis not present

## 2021-04-12 DIAGNOSIS — Z9981 Dependence on supplemental oxygen: Secondary | ICD-10-CM

## 2021-04-12 DIAGNOSIS — Z992 Dependence on renal dialysis: Secondary | ICD-10-CM | POA: Insufficient documentation

## 2021-04-12 DIAGNOSIS — D3A010 Benign carcinoid tumor of the duodenum: Secondary | ICD-10-CM

## 2021-04-12 DIAGNOSIS — I4891 Unspecified atrial fibrillation: Secondary | ICD-10-CM

## 2021-04-12 DIAGNOSIS — Z87891 Personal history of nicotine dependence: Secondary | ICD-10-CM | POA: Insufficient documentation

## 2021-04-12 DIAGNOSIS — N186 End stage renal disease: Secondary | ICD-10-CM

## 2021-04-12 DIAGNOSIS — I509 Heart failure, unspecified: Secondary | ICD-10-CM | POA: Diagnosis not present

## 2021-04-12 LAB — CMP (CANCER CENTER ONLY)
ALT: 26 U/L (ref 0–44)
AST: 38 U/L (ref 15–41)
Albumin: 3.1 g/dL — ABNORMAL LOW (ref 3.5–5.0)
Alkaline Phosphatase: 294 U/L — ABNORMAL HIGH (ref 38–126)
Anion gap: 14 (ref 5–15)
BUN: 26 mg/dL — ABNORMAL HIGH (ref 8–23)
CO2: 30 mmol/L (ref 22–32)
Calcium: 9.8 mg/dL (ref 8.9–10.3)
Chloride: 97 mmol/L — ABNORMAL LOW (ref 98–111)
Creatinine: 5.57 mg/dL (ref 0.44–1.00)
GFR, Estimated: 8 mL/min — ABNORMAL LOW (ref >60)
Glucose, Bld: 90 mg/dL (ref 70–99)
Potassium: 3.9 mmol/L (ref 3.5–5.1)
Sodium: 141 mmol/L (ref 135–145)
Total Bilirubin: 0.9 mg/dL (ref 0.3–1.2)
Total Protein: 6.8 g/dL (ref 6.5–8.1)

## 2021-04-12 LAB — CBC WITH DIFFERENTIAL (CANCER CENTER ONLY)
Abs Immature Granulocytes: 0.02 10*3/uL (ref 0.00–0.07)
Basophils Absolute: 0 10*3/uL (ref 0.0–0.1)
Basophils Relative: 0 %
Eosinophils Absolute: 0 10*3/uL (ref 0.0–0.5)
Eosinophils Relative: 0 %
HCT: 34.2 % — ABNORMAL LOW (ref 36.0–46.0)
Hemoglobin: 10.5 g/dL — ABNORMAL LOW (ref 12.0–15.0)
Immature Granulocytes: 0 %
Lymphocytes Relative: 22 %
Lymphs Abs: 1.2 10*3/uL (ref 0.7–4.0)
MCH: 30.4 pg (ref 26.0–34.0)
MCHC: 30.7 g/dL (ref 30.0–36.0)
MCV: 99.1 fL (ref 80.0–100.0)
Monocytes Absolute: 1 10*3/uL (ref 0.1–1.0)
Monocytes Relative: 19 %
Neutro Abs: 3.2 10*3/uL (ref 1.7–7.7)
Neutrophils Relative %: 59 %
Platelet Count: 160 10*3/uL (ref 150–400)
RBC: 3.45 MIL/uL — ABNORMAL LOW (ref 3.87–5.11)
RDW: 16.6 % — ABNORMAL HIGH (ref 11.5–15.5)
WBC Count: 5.4 10*3/uL (ref 4.0–10.5)
nRBC: 0 % (ref 0.0–0.2)

## 2021-04-12 NOTE — Progress Notes (Signed)
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 This was biopsied. Final pathology showed a small focus of well differentiated neuroendocrine tumor. Ki 67 <3%.  11/17/2020: CT abdomen w contrast showed no acute abdominal/pelvis abnormality, though a large right sided pleural effusion was noted.  02/22/2021: EGD with EUS showed a submucosal nodule in the duodenum. This was removed via Snare Band Ligation EMR. Pathology confirmed a well differentiated low grade neuroendocrine tumor.  Ki-67 <1%. 04/12/2021: establish care with Dr. Lorenso Courier   CHIEF COMPLAINTS/PURPOSE OF CONSULTATION:  "Duodenal Neuroendocrine Tumor "  HISTORY OF PRESENTING ILLNESS:  Melissa Adams 75 y.o. 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  On review of the previous records Melissa Adams was noted to have signs and symptoms concerning for GI bleed the last year.  She initially had bright red blood per rectum followed by dark stools.  On 11/14/2020 the patient had an EGD performed which found a nodule in the duodenum.  This was biopsied with final pathology showing a well-differentiated neuroendocrine tumor.  On 11/17/2020 the patient had a CT abdomen with contrast which showed no acute abnormalities though a large right-sided pleural effusion  was noted.  On 02/23/2019 the patient underwent repeat EGD with EUS to confirm submucosal nodule in the duodenum.  This was removed with snare band ligation EMR and pathology once again confirmed well-differentiated low-grade neuroendocrine tumor with a Ki-67 less than 1%.  Due to concern for this finding the patient was referred to oncology for further evaluation management.  On exam today Melissa Adams is accompanied by her son.  She is not quite sure why she is here today.  She notes that she has been well in the interim since her EGD.  She has not been having any issues with bleeding, bruising, or dark stools.  She does that she underwent a thoracentesis last week and is having easier time breathing.  She continues on dialysis Mondays Wednesdays and Fridays.  She does currently take over-the-counter iron pills which is subsequently boosted her hemoglobin back to her levels.  She is mildly hypotensive today with blood pressure of 92/54.  On further review the patient has a family history remarkable for an aneurysm in her mother and lupus in her sister.  She reports her father had a myocardial infarction.  She is a never smoker and does not currently drink.  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  She otherwise denies any fevers, chills, sweats, nausea, vomiting or diarrhea.  A full 10 point ROS is listed below.  MEDICAL HISTORY:  Past Medical History:  Diagnosis Date  . A-fib ([place])   . Allergies   . Arthritis   . Asthma   . Cardiomyopathy ([place])   . CHF (congestive heart failure) ([place])   . Chronic kidney disease   . Edema   . History of hemodialysis   . Hyperlipidemia   .  Hypertension   . Pulmonary hypertension ([place])   . Sleep apnea   . Tricuspid regurgitation     SURGICAL HISTORY: Past Surgical History:  Procedure Laterality Date  . A/V FISTULAGRAM Left 05/23/2020   Procedure: A/V FISTULAGRAM;  Surgeon: [name], MD;  Location: [facility];  Service: Cardiovascular;  Laterality: Left;  . A/V FISTULAGRAM N/A 12/21/2020   Procedure: A/V FISTULAGRAM - Left Upper Arm;  Surgeon: [name], MD;  Location: [facility];  Service: Cardiovascular;  Laterality: N/A;  . ABDOMINAL HYSTERECTOMY    . AV FISTULA PLACEMENT Left 11/04/2019   Procedure: Insertion Of Arteriovenous (Av) Gore-Tex Graft Arm;  Surgeon: [name], MD;  Location: [facility];  Service: Vascular;  Laterality: Left;  . BIOPSY  11/14/2020   Procedure: BIOPSY;  Surgeon: Yetta Flock, MD;  Location: [facility];  Service: Gastroenterology;;  . BIOPSY  11/16/2020   Procedure: BIOPSY;  Surgeon: Yetta Flock, MD;  Location: [facility];  Service: Gastroenterology;;  . BIOPSY  02/22/2021   Procedure: BIOPSY;  Surgeon: Irving Copas., MD;  Location: [facility];  Service: Gastroenterology;;  . CARDIAC CATHETERIZATION    . CARDIOVERSION N/A 10/26/2019   Procedure: CARDIOVERSION;  Surgeon: [name], MD;  Location: [facility] ENDOSCOPY;  Service: Cardiovascular;  Laterality: N/A;  . COLONOSCOPY W/ BIOPSIES AND POLYPECTOMY    . COLONOSCOPY WITH PROPOFOL N/A 11/16/2020   Procedure: COLONOSCOPY WITH PROPOFOL;  Surgeon: Yetta Flock, MD;  Location: [facility];  Service: Gastroenterology;  Laterality: N/A;  . ENDOSCOPIC MUCOSAL RESECTION N/A 02/22/2021   Procedure: ENDOSCOPIC MUCOSAL RESECTION;  Surgeon: Rush Landmark Telford Nab., MD;  Location: [facility];  Service: Gastroenterology;  Laterality: N/A;  . ESOPHAGOGASTRODUODENOSCOPY (EGD) WITH PROPOFOL Left 11/14/2020   Procedure: ESOPHAGOGASTRODUODENOSCOPY (EGD) WITH PROPOFOL;  Surgeon: Yetta Flock, MD;  Location: [facility];  Service: Gastroenterology;  Laterality: Left;  . ESOPHAGOGASTRODUODENOSCOPY (EGD) WITH PROPOFOL N/A 02/22/2021   Procedure: ESOPHAGOGASTRODUODENOSCOPY (EGD) WITH PROPOFOL;  Surgeon: Rush Landmark Telford Nab., MD;  Location: [facility];  Service:  Gastroenterology;  Laterality: N/A;  . FISTULA SUPERFICIALIZATION Left 01/21/2020   Procedure: FISTULA SUPERFICIALIZATION OF FISTULA;  Surgeon: [name], MD;  Location: [place];  Service: Vascular;  Laterality: Left;  . FISTULA SUPERFICIALIZATION Left 3/0/8657   Procedure: PLICATION OF LEFT UPPER EXTREMITY ARTERIOVENOUS FISTULA;  Surgeon: Waynetta [name], MD;  Location: [facility];  Service: Vascular;  Laterality: Left;  . GIVENS CAPSULE STUDY N/A 11/16/2020   Procedure: GIVENS CAPSULE STUDY;  Surgeon: Yetta Flock, MD;  Location: [facility];  Service: Gastroenterology;  Laterality: N/A;  . HEMOSTASIS CLIP PLACEMENT  02/22/2021   Procedure: HEMOSTASIS CLIP PLACEMENT;  Surgeon: Irving Copas., MD;  Location: [facility];  Service: Gastroenterology;;  . INSERTION OF DIALYSIS CATHETER Right 11/04/2019   Procedure: CONVERT TEMPORARY DIALYSIS CATHETER TO TUNNELED DIALYSIS CATHETER Right Internal Jugular.;  Surgeon: [name], MD;  Location: [facility];  Service: Vascular;  Laterality: Right;  . IR THORACENTESIS ASP PLEURAL SPACE W/IMG GUIDE  08/17/2020  . MULTIPLE TOOTH EXTRACTIONS    . POLYPECTOMY  11/16/2020   Procedure: POLYPECTOMY;  Surgeon: Yetta Flock, MD;  Location: [facility] ENDOSCOPY;  Service: Gastroenterology;;  . REVISION OF ARTERIOVENOUS GORETEX GRAFT Left 01/21/2020   Procedure: REVISION OF ARTERIOVENOUS FISTULA WITH SIDE BRANCH LIGATION;  Surgeon: [name], MD;  Location: [facility];  Service: Vascular;  Laterality: Left;  . SUBMUCOSAL LIFTING INJECTION  02/22/2021   Procedure: SUBMUCOSAL  LIFTING INJECTION;  Surgeon: Rush Landmark Telford Nab., MD;  Location: [facility];  Service: Gastroenterology;;  . Lia Foyer TATTOO INJECTION  02/22/2021   Procedure: SUBMUCOSAL TATTOO INJECTION;  Surgeon: Irving Copas., MD;  Location: [facility];  Service: Gastroenterology;;  . TEE WITHOUT CARDIOVERSION N/A 10/26/2019   Procedure:  TRANSESOPHAGEAL ECHOCARDIOGRAM (TEE);  Surgeon: [name], MD;  Location: [facility] ENDOSCOPY;  Service: Cardiovascular;  Laterality: N/A;  . THORACENTESIS     x 2  . THORACENTESIS N/A 11/07/2020   Procedure: Mathews Robinsons;  Surgeon: [name], MD;  Location: [facility] ENDOSCOPY;  Service: Pulmonary;  Laterality: N/A;  . THORACENTESIS N/A 04/06/2021   Procedure: THORACENTESIS;  Surgeon: Freddi Starr, MD;  Location: [facility] ENDOSCOPY;  Service: Pulmonary;  Laterality: N/A;  . UPPER ESOPHAGEAL ENDOSCOPIC ULTRASOUND (EUS) N/A 02/22/2021   Procedure: UPPER ESOPHAGEAL ENDOSCOPIC ULTRASOUND (EUS);  Surgeon: Irving Copas., MD;  Location: [facility];  Service: Gastroenterology;  Laterality: N/A;    SOCIAL HISTORY: Social History   Socioeconomic History  . Marital status: Widowed    Spouse name: Not on file  . Number of children: Not on file  . Years of education: Not on file  . Highest education level: Not on file  Occupational History  . Not on file  Tobacco Use  . Smoking status: Former Smoker    Types: Cigarettes  . Smokeless tobacco: Never Used  Vaping Use  . Vaping Use: Never used  Substance and Sexual Activity  . Alcohol use: Not Currently  . Drug use: Not Currently  . Sexual activity: Not Currently  Other Topics Concern  . Not on file  Social History Narrative  . Not on file   Social Determinants of Health   Financial Resource Strain: Not on file  Food Insecurity: Not on file  Transportation Needs: No Transportation Needs  . Lack of Transportation (Medical): No  . Lack of Transportation (Non-Medical): No  Physical Activity: Not on file  Stress: Not on file  Social Connections: Socially Isolated  . Frequency of Communication with Friends and Family: More than three times a week  . Frequency of Social Gatherings with Friends and Family: Once a week  . Attends Religious Services: Never  . Active Member of Clubs or Organizations: No  . Attends Theatre manager Meetings: Never  . Marital Status: Widowed  Intimate Partner Violence: Not on file    FAMILY HISTORY: Family History  Problem Relation Age of Onset  . Seizures Father   . CAD Father   . Diabetes Sister   . Lupus Sister   . Hypertension Sister   . Colon cancer Neg Hx   . Esophageal cancer Neg Hx   . Pancreatic cancer Neg Hx   . Stomach cancer Neg Hx   . Inflammatory bowel disease Neg Hx   . Liver disease Neg Hx   . Rectal cancer Neg Hx     ALLERGIES:  is allergic to black walnut flavor and shellfish allergy.  MEDICATIONS:  Current Outpatient Medications  Medication Sig Dispense Refill  . acetaminophen (TYLENOL) 325 MG tablet Take 2 tablets (650 mg total) by mouth every 4 (four) hours as needed for headache or mild pain.    Marland Kitchen albuterol (VENTOLIN HFA) 108 (90 Base) MCG/ACT inhaler Inhale 2 puffs into the lungs every 6 (six) hours as needed for wheezing or shortness of breath. 6.7 g 1  . allopurinol (ZYLOPRIM) 300 MG tablet TAKE 1 TABLET BY MOUTH EVERY NIGHT AT BEDTIME (Patient taking differently:  Take 300 mg by mouth at bedtime.) 30 tablet 3  . apixaban (ELIQUIS) 5 MG TABS tablet Take 1 tablet (5 mg total) by mouth 2 (two) times daily. 60 tablet 3  . atorvastatin (LIPITOR) 80 MG tablet TAKE 1 TABLET (80 MG TOTAL) BY MOUTH AT BEDTIME. 30 tablet 5  . B Complex-C-Zn-Folic Acid (DIALYVITE 700-FVCB 15) 0.8 MG TABS Take 1 tablet by mouth daily.    . cetirizine (ZYRTEC) 10 MG tablet Take 10 mg by mouth daily as needed for allergies.    . cinacalcet (SENSIPAR) 30 MG tablet Take 30 mg by mouth daily with supper.     . esomeprazole (NEXIUM) 40 MG capsule Take 1 capsule (40 mg total) by mouth 2 (two) times daily before a meal. 30 capsule 1  . ferric citrate (AURYXIA) 1 GM 210 MG(Fe) tablet Take 210 mg by mouth 3 (three) times daily with meals.    . furosemide (LASIX) 40 MG tablet Take 80 mg by mouth See admin instructions. Take 2 tablets (80 mg) by mouth twice daily on  non-dialysis days - Sunday, Tuesday, Thursday    . lidocaine-prilocaine (EMLA) cream Apply 1 application topically See admin instructions. Apply topically to port access one hour prior to dialysis - Monday, Wednesday, Friday, Saturday    . metoprolol succinate (TOPROL-XL) 25 MG 24 hr tablet TAKE 1 TABLET BY MOUTH EVERY MORNING & TAKE 1 TABLET EVERY NIGHT AT BEDTIME WITH OR IMMEDIATELY FOLLOWING MEAL (Patient taking differently: Take 25 mg by mouth in the morning and at bedtime.) 60 tablet 11  . midodrine (PROAMATINE) 10 MG tablet Take 1 tablet (10 mg total) by mouth daily. 30 tablet 0  . montelukast (SINGULAIR) 10 MG tablet Take 1 tablet by mouth once daily (Patient taking differently: Take 10 mg by mouth daily as needed (allergies).) 90 tablet 0  . Multiple Vitamins-Minerals (MULTIVITAMIN WITH MINERALS) tablet Take 1 tablet by mouth daily.    . Nutritional Supplements (,FEEDING SUPPLEMENT, PROSOURCE PLUS) liquid Take 30 mLs by mouth 2 (two) times daily between meals.    Marland Kitchen oxyCODONE-acetaminophen (PERCOCET) 5-325 MG tablet Take 1 tablet by mouth every 6 (six) hours as needed for severe pain. 8 tablet 0  . sucralfate (CARAFATE) 1 GM/10ML suspension Take 10 mLs (1 g total) by mouth 2 (two) times daily. 420 mL 1  . vitamin B-12 (CYANOCOBALAMIN) 1000 MCG tablet Take 1,000 mcg by mouth daily.    . vitamin C (VITAMIN C) 500 MG tablet Take 1 tablet (500 mg total) by mouth daily. 30 tablet 0   No current facility-administered medications for this visit.    REVIEW OF SYSTEMS:   Constitutional: ( - ) fevers, ( - )  chills , ( - ) night sweats Eyes: ( - ) blurriness of vision, ( - ) double vision, ( - ) watery eyes Ears, nose, mouth, throat, and face: ( - ) mucositis, ( - ) sore throat Respiratory: ( - ) cough, ( - ) dyspnea, ( - ) wheezes Cardiovascular: ( - ) palpitation, ( - ) chest discomfort, ( - ) lower extremity swelling Gastrointestinal:  ( - ) nausea, ( - ) heartburn, ( - ) change in bowel  habits Skin: ( - ) abnormal skin rashes Lymphatics: ( - ) new lymphadenopathy, ( - ) easy bruising Neurological: ( - ) numbness, ( - ) tingling, ( - ) new weaknesses Behavioral/Psych: ( - ) mood change, ( - ) new changes  All other systems were reviewed with the patient  and are negative.  PHYSICAL EXAMINATION: ECOG PERFORMANCE STATUS: 2 - Symptomatic, <50% confined to bed  Vitals:   04/12/21 0926  BP: (!) 92/54  Pulse: 66  Resp: 18  Temp: 97.8 F (36.6 C)  SpO2: 97%   Filed Weights   04/12/21 0926  Weight: 197 lb 14.4 oz (89.8 kg)    GENERAL: well appearing elderly African American female in NAD  SKIN: skin color, texture, turgor are normal, no rashes or significant lesions EYES: conjunctiva are pink and non-injected, sclera clear LUNGS: clear to auscultation and percussion with normal breathing effort HEART: regular rate & rhythm and no murmurs and no lower extremity edema Musculoskeletal: no cyanosis of digits and no clubbing  PSYCH: alert & oriented x 3, fluent speech NEURO: no focal motor/sensory deficits  LABORATORY DATA:  I have reviewed the data as listed CBC Latest Ref Rng & Units 04/12/2021 03/01/2021 02/22/2021  WBC 4.0 - 10.5 K/uL 5.4 - -  Hemoglobin 12.0 - 15.0 g/dL 10.5(L) 14.3 11.6(L)  Hematocrit 36.0 - 46.0 % 34.2(L) 42.0 34.0(L)  Platelets 150 - 400 K/uL 160 - -    CMP Latest Ref Rng & Units 04/12/2021 04/06/2021 03/01/2021  Glucose 70 - 99 mg/dL 90 - 79  BUN 8 - 23 mg/dL 26(H) - 31(H)  Creatinine 0.44 - 1.00 mg/dL 5.57(HH) - 5.90(H)  Sodium 135 - 145 mmol/L 141 - 138  Potassium 3.5 - 5.1 mmol/L 3.9 - 3.9  Chloride 98 - 111 mmol/L 97(L) - 100  CO2 22 - 32 mmol/L 30 - -  Calcium 8.9 - 10.3 mg/dL 9.8 - -  Total Protein 6.5 - 8.1 g/dL 6.8 6.5 -  Total Bilirubin 0.3 - 1.2 mg/dL 0.9 - -  Alkaline Phos 38 - 126 U/L 294(H) - -  AST 15 - 41 U/L 38 - -  ALT 0 - 44 U/L 26 - -     PATHOLOGY: SURGICAL PATHOLOGY  CASE: [id number]  PATIENT: [place]   Surgical Pathology Report   Clinical History: duodenal NET, abnormal EGD, duodenitis, gastritis, R/O  H. Pylori (cm)    FINAL MICROSCOPIC DIAGNOSIS:   A. DUODENUM, BIOPSY:  - Gastric heterotopia with associated peptic changes  - Negative for neuroendocrine tumor   B. STOMACH, BIOPSY:  - Gastric antral and oxyntic mucosa with no specific histopathologic  changes  - Warthin Starry stain is negative for Helicobacter pylori   C. DUODENAL NODULE, EMR:  - Well-differentiated (low-grade; G1) neuroendocrine tumor, see comment  - No significant mitotic activity or necrosis  - Tumor is less than 0.1 mm from the inked deep resection margin     COMMENT:   C. Immunohistochemical stain for Ki-67 shows a proliferative index of  less than 1%, consistent with above interpretation.    GROSS DESCRIPTION:   A: Received in formalin are tan, soft tissue fragments that are  submitted in toto. Number: 3 size: 0.2-0.3 cm blocks: 1   B: Received in formalin are tan, soft tissue fragments that are  submitted in toto. Number: 7 size: 0.2-0.4 cm blocks: 1   C: Received in formalin is a 0.9 x 0.8 x 0.5 cm tan-red mucosal polyp.  The specimen is inked, sectioned and entirely submitted in 1 cassette.  [facility] 02/22/2021)    Final Diagnosis performed by Jaquita Folds, MD.  Electronically  signed 02/27/2021  Technical component performed at [facility], [place] [address], [place], [place] [address].  Professional component performed at [facility],  [place] [address], [place], [place] [address].  Immunohistochemistry Technical component (if applicable) was performed  at [facility]. [address], [place],  [place], [place] [address].  IMMUNOHISTOCHEMISTRY DISCLAIMER (if applicable):  Some of these immunohistochemical stains may have been developed and the  performance characteristics determine by [facility]. Some  may not have  been cleared or approved by the U.S. Food and Drug  Administration. The FDA has determined that such clearance or approval  is not necessary. This test is used for clinical purposes. It should not  be regarded as investigational or for research. This laboratory is  certified under the [place]  (CLIA-88) as qualified to perform high complexity clinical laboratory  testing. The controls stained appropriately.   RADIOGRAPHIC STUDIES: DG Chest 2 View  Result Date: 03/30/2021 CLINICAL DATA:  Shortness of breath, history of pleural effusion EXAM: CHEST - 2 VIEW COMPARISON:  11/18/2020 FINDINGS: Enlargement of cardiac silhouette with pulmonary vascular congestion. Atherosclerotic calcification aorta. Increased RIGHT pleural effusion and basilar atelectasis versus prior exam. Remaining lungs clear. No infiltrate or pneumothorax. Osseous demineralization with mild chronic superior endplate height loss of an upper thoracic vertebra. IMPRESSION: Enlargement of cardiac silhouette with pulmonary vascular congestion. Increased RIGHT pleural effusion and basilar atelectasis. Aortic Atherosclerosis (ICD10-I70.0). Electronically Signed   By: Lavonia Dana M.D.   On: 03/30/2021 16:16   DG CHEST PORT 1 VIEW  Result Date: 04/06/2021 CLINICAL DATA:  Status post thoracocentesis EXAM: PORTABLE CHEST 1 VIEW COMPARISON:  Mar 30, 2021 FINDINGS: The cardiomediastinal silhouette is unchanged and enlarged in contour. Decreased RIGHT pleural effusion with small residual RIGHT pleural effusion. No significant pneumothorax. Unchanged RIGHT perihilar opacity. Increased linear opacity of the RIGHT lower lung along the minor fissure, likely atelectasis. Visualized abdomen is unremarkable. Multilevel degenerative changes of the thoracic spine. IMPRESSION: Decreased pleural effusion status post thoracocentesis. Mildly increased likely atelectasis. Electronically Signed   By: Valentino Saxon MD    On: 04/06/2021 14:57    Melissa Adams 75 y.o. 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  After review of the labs, review of the records, and discussion with the patient the patients findings are most consistent with a neuroendocrine tumor of the duodenum.  This tumor was quite small and had a very low proliferation rate with a Ki-67 of less than 1%.  Given these findings and the negative CT scan in December 2021 I do not believe there is any need for further work-up or evaluation for this tumor.  I do agree with continued surveillance with yearly EGDs in order to assure the tumor has not recurred.  At this time recommend follow-up as needed in our clinic, however if there will be any new or concerning findings we will be happy to see the patient back in clinic.  #Duodenal Neuroendocrine Tumor -- Findings are most consistent with a very small neuroendocrine tumor (less than 1 cm on every side) with a Ki-67 of less than 1% -- Prior CT imaging performed in December shows no evidence of metastatic disease. -- Agree with yearly endoscopies for monitoring of recurrence of his neuroendocrine tumor. -- No clear indication for further imaging or studies in the work-up of this tumor -- Today we will order baseline CBC, CMP, and chromogranin A -- Recommend continued follow-up with gastroenterology with scheduled EGDs  to monitor for recurrence. -- There is no need for routine follow-up in our clinic unless there are particular concerns our GI colleagues would like [place] to address  Orders Placed This Encounter  Procedures  . CBC with Differential (Cancer Center Only)    Standing Status:   Future    Number of Occurrences:   1    Standing Expiration Date:   04/12/2022  . CMP ([place] only)    Standing Status:   Future     Number of Occurrences:   1    Standing Expiration Date:   04/12/2022  . Chromogranin A    Standing Status:   Future    Number of Occurrences:   1    Standing Expiration Date:   04/12/2022    All questions were answered. The patient knows to call the clinic with any problems, questions or concerns.  A total of more than 60 minutes were spent on this encounter with face-to-face time and non-face-to-face time, including preparing to see the patient, ordering tests and/or medications, counseling the patient and coordination of care as outlined above.   Ledell Peoples, MD Department of Hematology/Oncology [place] at [facility] Phone: [phone] Pager: [phone] Email: [email or web address]  04/12/2021 12:20 PM

## 2021-04-13 LAB — CHROMOGRANIN A: Chromogranin A (ng/mL): 354 ng/mL — ABNORMAL HIGH (ref 0.0–101.8)

## 2021-04-20 ENCOUNTER — Other Ambulatory Visit (HOSPITAL_COMMUNITY): Payer: Self-pay | Admitting: Cardiology

## 2021-04-20 ENCOUNTER — Encounter: Payer: Self-pay | Admitting: Primary Care

## 2021-04-24 ENCOUNTER — Other Ambulatory Visit: Payer: Self-pay

## 2021-04-24 ENCOUNTER — Encounter (INDEPENDENT_AMBULATORY_CARE_PROVIDER_SITE_OTHER): Payer: Self-pay | Admitting: Primary Care

## 2021-04-24 ENCOUNTER — Ambulatory Visit (INDEPENDENT_AMBULATORY_CARE_PROVIDER_SITE_OTHER): Payer: Medicare Other | Admitting: Primary Care

## 2021-04-24 VITALS — BP 101/64 | HR 60 | Temp 97.3°F | Ht 63.0 in | Wt 199.8 lb

## 2021-04-24 DIAGNOSIS — Z992 Dependence on renal dialysis: Secondary | ICD-10-CM

## 2021-04-24 DIAGNOSIS — L853 Xerosis cutis: Secondary | ICD-10-CM

## 2021-04-24 DIAGNOSIS — Z131 Encounter for screening for diabetes mellitus: Secondary | ICD-10-CM

## 2021-04-24 DIAGNOSIS — N186 End stage renal disease: Secondary | ICD-10-CM

## 2021-04-24 LAB — POCT GLYCOSYLATED HEMOGLOBIN (HGB A1C): Hemoglobin A1C: 5.1 % (ref 4.0–5.6)

## 2021-04-24 MED ORDER — GABAPENTIN 100 MG PO CAPS: 100.0000 mg | ORAL_CAPSULE | Freq: Three times a day (TID) | ORAL | 1 refills | Status: DC

## 2021-04-24 MED ORDER — TRIAMCINOLONE ACETONIDE 0.1 % EX OINT
TOPICAL_OINTMENT | Freq: Three times a day (TID) | CUTANEOUS | 1 refills | Status: DC | PRN
Start: 2021-04-24 — End: 2021-09-07

## 2021-04-24 NOTE — Progress Notes (Signed)
Patient ID: Melissa Adams, female    DOB: 20-Jan-1946  MRN: [id number]  Annual Wellness Visit  Melissa Adams is a 75 y.o. Female who presents for an Annual Wellness Visit. ? Patient Active Problem List   Diagnosis Date Noted  . Abnormal findings on esophagogastroduodenoscopy (EGD) 01/27/2021  . 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  . Benign carcinoid tumor of duodenum   . Benign neoplasm of colon   . Cirrhosis of liver with ascites ([place])   . Melena   . GI bleed 11/13/2020  . Upper GI bleeding 11/13/2020  . Chronic systolic CHF (congestive heart failure) ([place]) 11/13/2020  . Acute blood loss anemia 11/13/2020  . Chronic anticoagulation 11/13/2020  . Pleural effusion 06/11/2020  . Aortic atherosclerosis ([place]) 06/11/2020  . ESRD on dialysis ([place]) 01/11/2020  . Acute on chronic systolic heart failure ([place])   . Pressure injury of skin 10/22/2019  . Dyspnea   . COVID-19 virus infection   . CHF exacerbation ([place]) 10/12/2019  . Acute CHF (congestive heart failure) ([place]) 10/11/2019  . Atrial fibrillation, chronic ([place]) 10/11/2019  . Essential hypertension 10/11/2019  . Sleep apnea 10/11/2019  . AKI (acute kidney injury) ([place]) 10/11/2019  . Gout 10/11/2019  . Anemia 10/11/2019    Current Outpatient Medications on File Prior to Visit  Medication Sig Dispense Refill  . acetaminophen (TYLENOL) 325 MG tablet Take 2 tablets (650 mg total) by mouth every 4 (four) hours as needed for headache or mild pain.    Marland Kitchen albuterol (VENTOLIN HFA) 108 (90 Base) MCG/ACT inhaler Inhale 2 puffs into the lungs every 6 (six) hours as needed for wheezing or shortness of breath. 6.7 g 1  . allopurinol (ZYLOPRIM) 300 MG tablet TAKE 1 TABLET BY MOUTH EVERY NIGHT AT BEDTIME (Patient taking differently: Take 300 mg by mouth at bedtime.) 30 tablet 3  . atorvastatin (LIPITOR) 80 MG tablet TAKE 1 TABLET (80 MG TOTAL) BY MOUTH AT BEDTIME. 30 tablet 5  . B Complex-C-Zn-Folic Acid (DIALYVITE  800-ZINC 15) 0.8 MG TABS Take 1 tablet by mouth daily.    . cetirizine (ZYRTEC) 10 MG tablet Take 10 mg by mouth daily as needed for allergies.    . cinacalcet (SENSIPAR) 30 MG tablet Take 30 mg by mouth daily with supper.     Marland Kitchen ELIQUIS 5 MG TABS tablet TAKE 1 TABLET (5 MG TOTAL) BY MOUTH 2 (TWO) TIMES DAILY. 60 tablet 3  . esomeprazole (NEXIUM) 40 MG capsule Take 1 capsule (40 mg total) by mouth 2 (two) times daily before a meal. 30 capsule 1  . ferric citrate (AURYXIA) 1 GM 210 MG(Fe) tablet Take 210 mg by mouth 3 (three) times daily with meals.    . furosemide (LASIX) 40 MG tablet Take 80 mg by mouth See admin instructions. Take 2 tablets (80 mg) by mouth twice daily on non-dialysis days - Sunday, Tuesday, Thursday    . lidocaine-prilocaine (EMLA) cream Apply 1 application topically See admin instructions. Apply topically to port access one hour prior to dialysis - Monday, Wednesday, Friday, Saturday    . metoprolol succinate (TOPROL-XL) 25 MG 24 hr tablet TAKE 1 TABLET BY MOUTH EVERY MORNING & TAKE 1 TABLET EVERY NIGHT AT BEDTIME WITH OR IMMEDIATELY FOLLOWING MEAL (Patient taking differently: Take 25 mg by mouth in the morning and at bedtime.) 60 tablet 11  . midodrine (PROAMATINE) 10 MG tablet Take 1 tablet (10 mg total) by mouth daily. 30 tablet 0  . montelukast (SINGULAIR) 10 MG  tablet Take 1 tablet by mouth once daily (Patient taking differently: Take 10 mg by mouth daily as needed (allergies).) 90 tablet 0  . Multiple Vitamins-Minerals (MULTIVITAMIN WITH MINERALS) tablet Take 1 tablet by mouth daily.    . Nutritional Supplements (,FEEDING SUPPLEMENT, PROSOURCE PLUS) liquid Take 30 mLs by mouth 2 (two) times daily between meals.    Marland Kitchen oxyCODONE-acetaminophen (PERCOCET) 5-325 MG tablet Take 1 tablet by mouth every 6 (six) hours as needed for severe pain. 8 tablet 0  . sucralfate (CARAFATE) 1 GM/10ML suspension Take 10 mLs (1 g total) by mouth 2 (two) times daily. 420 mL 1  . vitamin B-12  (CYANOCOBALAMIN) 1000 MCG tablet Take 1,000 mcg by mouth daily.    . vitamin C (VITAMIN C) 500 MG tablet Take 1 tablet (500 mg total) by mouth daily. 30 tablet 0   No current facility-administered medications on file prior to visit.    Allergies  Allergen Reactions  . [employer]  . Shellfish Allergy Itching    Makes throat itch      Health Risk Assessment The patient has completed a Health Risk Assessment. This has been reviewed with the patient and has been scanned into the [facility] system as a separate document.   Current Medical Providers and Suppliers The providers who are involved in the care of this patient are listed above. Additional providers and suppliers are listed below: Orson Slick, MD- Hematology and Oncology  Lavena Bullion, DO-Gastroenterology      Erin Fulling Cheryle Horsfall, MD- Pulmonary Disease Age-appropriate Screening Schedule Refer to the list in the Health Maintenance section for an age appropriated screening completed by this patient. Additional screening recommendations are listed below in the plan section. The patient has been provided with a written plan.    Health Maintenance Due  Topic Date Due  . MAMMOGRAM  Never done  . Zoster Vaccines- Shingrix (1 of 2) Never done  . DEXA SCAN  Never done  . COVID-19 Vaccine (3 - Moderna risk 4-dose series) 02/22/2020    Depression Screen Over the past two weeks have you:     Felt down or depressed? no     Had little interest or pleasure in doing things? no     depression    Functional Ability/Safety Screen 1. Falls Risk: Does the patient need assistance with ambulation? no Does the patient have a history of a fall in the last 90 days? no Is the patient at risk for falls? no   2. Does the patient need help with: Ron Parker index)         Bathing: no         Dressing : no         Toileting: no         Transferring: no         Continence: no         Feeding: no           PHYSICAL  EXAM: Vitals:   04/24/21 0947  BP: 101/64  Pulse: 60  Temp: (!) 97.3 F (36.3 C)  TempSrc: Temporal  SpO2: 99%  Weight: 199 lb 12.8 oz (90.6 kg)  Height: 5\' 3"  (1.6 m)   Body mass index is 35.39 kg/m. General: No apparent distress. Obese female  Eyes: Extraocular eye movements intact, pupils equal and round. Neck: Supple, trachea midline. Thyroid: No enlargement, mobile without fixation, no tenderness. Cardiovascular: Regular rhythm and rate, no murmur, normal radial pulses. Respiratory:  Normal respiratory effort, clear to auscultation. Gastrointestinal: Normal pitch active bowel sounds, nontender abdomen without distention or appreciable hepatomegaly. Neurologic: deep tendon reflexes + Musculoskeletal: Normal muscle tone, no tenderness on palpation of tibia, no excessive thoracic kyphosis. Skin: Appropriate warmth, no visible rash. Mental status: Alert, conversant, speech clear, thought logical, appropriate mood and affect, no hallucinations or delusions evident. Hematologic/lymphatic: No cervical adenopathy, no visible ecchymoses.   ASSESSMENT AND PLAN: Tippi was seen today for annual exam.  Diagnoses and all orders for this visit:  Screening for diabetes mellitus -     HgB A1c 5.1  ESRD on dialysis [facility]) Followed by nephrology   Xerosis of skin Secondary to dialysis  -     triamcinolone 0.1% oint-Eucerin equivalent cream 1:1 mixture; Apply topically 3 (three) times daily as needed. APPLY TO DRY AFFECTED AREAS AS NEEDED 3 TIMES ADAY -     gabapentin (NEURONTIN) 100 MG capsule; Take 1 capsule (100 mg total) by mouth 3 (three) times daily.   During the course of the visit the patient was educated and counseled about appropriate screening and preventive services including:    Care Gaps     Pneumococcal vaccine  Screening mammography Shingles vaccine         Discussed the patient's BMI with her. The BMI BMI is not in the acceptable range due to terminal illness    Orders placed during this encounter include:       -     triamcinolone 0.1% oint-Eucerin equivalent cream 1:1 mixture; Apply topically 3 (three) times daily as needed. APPLY TO DRY AFFECTED AREAS AS NEEDED 3 TIMES ADAY -     gabapentin (NEURONTIN) 100 MG capsule; Take 1 capsule (100 mg total) by mouth 3 (three) times daily.  Return in about 6 months (around 10/24/2021) for pruitus from HD.   ? An after visit summary with all of these plans was given to the patient.

## 2021-04-24 NOTE — Patient Instructions (Signed)
Skin color changes, such as pallor and hyperpigmentation, are seen in approximately 40% and 20% of patients, respectively (3,4). Half-and-half nails (or Lindsay's nails) are seen in approximately 20% of patients, and xerosis (dry and scaly skin) is seen in 50%-85% of patients with ESRD

## 2021-05-02 ENCOUNTER — Telehealth: Payer: Self-pay | Admitting: Primary Care

## 2021-05-02 DIAGNOSIS — G4734 Idiopathic sleep related nonobstructive alveolar hypoventilation: Secondary | ICD-10-CM

## 2021-05-02 NOTE — Telephone Encounter (Signed)
Please let patient know overnight oximetry on room air 04/20/2021 showed that patient spent 52 minutes with SPO2 less than 88% (SPO2 LOW 82%/Basal SPO2 91%). She qualifies for nocturnal oxygen. Needs 2L

## 2021-05-03 NOTE — Telephone Encounter (Signed)
Called and spoke with pt's daughter [name] letting her know the results of pt's ONO and stated to her that this does qualify pt for nocturnal O2. [name] verbalized understanding. Order has been placed. Nothing further needed.

## 2021-05-07 ENCOUNTER — Telehealth: Payer: Self-pay | Admitting: Pulmonary Disease

## 2021-05-07 NOTE — Telephone Encounter (Signed)
Signed, sorry with new update things look difference in epic. Needs to adjust

## 2021-05-07 NOTE — Telephone Encounter (Signed)
Spoke with Melissa Adams form [employer] who states pt's order for overnight O2 needs the be signed off. [name] could you sign the order. It looks like was placed on 05/03/21 by [name]? Thank you

## 2021-05-08 LAB — FUNGAL ORGANISM REFLEX

## 2021-05-08 LAB — FUNGUS CULTURE RESULT

## 2021-05-08 LAB — FUNGUS CULTURE WITH STAIN

## 2021-05-10 ENCOUNTER — Other Ambulatory Visit (INDEPENDENT_AMBULATORY_CARE_PROVIDER_SITE_OTHER): Payer: Medicare Other

## 2021-05-10 ENCOUNTER — Other Ambulatory Visit: Payer: Medicare Other

## 2021-05-10 ENCOUNTER — Other Ambulatory Visit: Payer: Self-pay

## 2021-05-10 ENCOUNTER — Ambulatory Visit (HOSPITAL_BASED_OUTPATIENT_CLINIC_OR_DEPARTMENT_OTHER): Payer: Medicare Other

## 2021-05-10 DIAGNOSIS — K7469 Other cirrhosis of liver: Secondary | ICD-10-CM | POA: Diagnosis not present

## 2021-05-10 DIAGNOSIS — R188 Other ascites: Secondary | ICD-10-CM

## 2021-05-11 ENCOUNTER — Telehealth: Payer: Self-pay

## 2021-05-11 LAB — COMPREHENSIVE METABOLIC PANEL
ALT: 19 U/L (ref 0–35)
AST: 26 U/L (ref 0–37)
Albumin: 3.9 g/dL (ref 3.5–5.2)
Alkaline Phosphatase: 251 U/L — ABNORMAL HIGH (ref 39–117)
BUN: 29 mg/dL — ABNORMAL HIGH (ref 6–23)
CO2: 29 mEq/L (ref 19–32)
Calcium: 9.9 mg/dL (ref 8.4–10.5)
Chloride: 95 mEq/L — ABNORMAL LOW (ref 96–112)
Creatinine, Ser: 5.67 mg/dL (ref 0.40–1.20)
GFR: 6.92 mL/min — CL (ref >60.00)
Glucose, Bld: 99 mg/dL (ref 70–99)
Potassium: 4 mEq/L (ref 3.5–5.1)
Sodium: 137 mEq/L (ref 135–145)
Total Bilirubin: 0.8 mg/dL (ref 0.2–1.2)
Total Protein: 6.6 g/dL (ref 6.0–8.3)

## 2021-05-11 LAB — CBC
HCT: 34 % — ABNORMAL LOW (ref 36.0–46.0)
Hemoglobin: 10.8 g/dL — ABNORMAL LOW (ref 12.0–15.0)
MCHC: 31.8 g/dL (ref 30.0–36.0)
MCV: 98.4 fl (ref 78.0–100.0)
Platelets: 163 10*3/uL (ref 150.0–400.0)
RBC: 3.45 Mil/uL — ABNORMAL LOW (ref 3.87–5.11)
RDW: 16.4 % — ABNORMAL HIGH (ref 11.5–15.5)
WBC: 4.9 10*3/uL (ref 4.0–10.5)

## 2021-05-11 LAB — CHROMOGRANIN A: Chromogranin A (ng/mL): 216.2 ng/mL — ABNORMAL HIGH (ref 0.0–101.8)

## 2021-05-11 NOTE — Telephone Encounter (Signed)
Noted. Thanks.

## 2021-05-11 NOTE — Telephone Encounter (Signed)
Hope from [place] lab said that patient's creatinine is 5.67 and GFR is 6.92. Any advice?

## 2021-05-13 LAB — PROTIME-INR

## 2021-05-13 LAB — AFP TUMOR MARKER: AFP-Tumor Marker: 2.9 ng/mL

## 2021-05-17 ENCOUNTER — Other Ambulatory Visit: Payer: Self-pay

## 2021-05-17 ENCOUNTER — Ambulatory Visit (HOSPITAL_BASED_OUTPATIENT_CLINIC_OR_DEPARTMENT_OTHER)
Admission: RE | Admit: 2021-05-17 | Discharge: 2021-05-17 | Disposition: A | Discharge status: Home / Self Care | Payer: Medicare Other | Source: Ambulatory Visit | Attending: Gastroenterology | Admitting: Gastroenterology

## 2021-05-17 DIAGNOSIS — K7469 Other cirrhosis of liver: Secondary | ICD-10-CM | POA: Diagnosis not present

## 2021-05-17 DIAGNOSIS — K269 Duodenal ulcer, unspecified as acute or chronic, without hemorrhage or perforation: Secondary | ICD-10-CM | POA: Insufficient documentation

## 2021-05-24 ENCOUNTER — Other Ambulatory Visit: Payer: Self-pay

## 2021-05-24 DIAGNOSIS — K7469 Other cirrhosis of liver: Secondary | ICD-10-CM

## 2021-05-24 DIAGNOSIS — K769 Liver disease, unspecified: Secondary | ICD-10-CM

## 2021-06-11 ENCOUNTER — Encounter (HOSPITAL_COMMUNITY): Payer: Self-pay

## 2021-06-11 ENCOUNTER — Other Ambulatory Visit: Payer: Self-pay

## 2021-06-11 ENCOUNTER — Ambulatory Visit (HOSPITAL_COMMUNITY)
Admission: EM | Admit: 2021-06-11 | Discharge: 2021-06-11 | Disposition: A | Discharge status: Home / Self Care | Payer: Medicare Other | Attending: Emergency Medicine | Admitting: Emergency Medicine

## 2021-06-11 DIAGNOSIS — M545 Low back pain, unspecified: Secondary | ICD-10-CM | POA: Diagnosis not present

## 2021-06-11 MED ORDER — PREDNISONE 20 MG PO TABS: 40.0000 mg | ORAL_TABLET | Freq: Every day | ORAL | 0 refills | Status: DC

## 2021-06-11 NOTE — ED Provider Notes (Signed)
[place]    CSN: [id number] Arrival date & time: 06/11/21  1303      History   Chief Complaint Chief Complaint  Patient presents with   Back Pain    HPI Melissa Adams is a 75 y.o. female.   Patient presents with low back pain radiating to the top of buttocks beginning yesterday, denies precipitating event. worsened with long period of sitting or standing. Had to sit 4 hours for dialysis this morning. ROM intact. Denies numbness, tinging, prior Injury or trauma. History of arthritis, ESRD on dialysis, CHF, GI bleed, tumor of colon, intestine.,   Past Medical History:  Diagnosis Date   A-fib ([place])    Allergies    Arthritis    Asthma    Cardiomyopathy ([place])    CHF (congestive heart failure) (H[facility])    Chronic kidney disease    Edema    History of hemodialysis    Hyperlipidemia    Hypertension    Pulmonary hypertension (H[facility])    Sleep apnea    Tricuspid regurgitation     Patient Active Problem List   Diagnosis Date Noted   Abnormal findings on esophagogastroduodenoscopy (EGD) 01/27/2021   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   Benign carcinoid tumor of duodenum    Benign neoplasm of colon    Cirrhosis of liver with ascites ([place])    Melena    GI bleed 11/13/2020   Upper GI bleeding 01/75/1025   Chronic systolic CHF (congestive heart failure) ([place]) 11/13/2020   Acute blood loss anemia 11/13/2020   Chronic anticoagulation 11/13/2020   Pleural effusion 06/11/2020   Aortic atherosclerosis ([place]) 06/11/2020   ESRD on dialysis ([place]) 01/11/2020   Acute on chronic systolic heart failure (H[facility])    Pressure injury of skin 10/22/2019   Dyspnea    COVID-19 virus infection    CHF exacerbation ([place]) 10/12/2019   Acute CHF (congestive heart failure) ([place]) 10/11/2019   Atrial fibrillation, chronic ([place]) 10/11/2019   Essential hypertension 10/11/2019   Sleep apnea 10/11/2019   AKI (acute kidney injury) ([place]) 10/11/2019   Gout 10/11/2019    Anemia 10/11/2019    Past Surgical History:  Procedure Laterality Date   A/V FISTULAGRAM Left 05/23/2020   Procedure: A/V FISTULAGRAM;  Surgeon: [name], MD;  Location: [place] [facility];  Service: Cardiovascular;  Laterality: Left;   A/V FISTULAGRAM N/A 12/21/2020   Procedure: A/V FISTULAGRAM - Left Upper Arm;  Surgeon: [name], MD;  Location: [place] [facility];  Service: Cardiovascular;  Laterality: N/A;   ABDOMINAL HYSTERECTOMY     AV FISTULA PLACEMENT Left 11/04/2019   Procedure: Insertion Of Arteriovenous (Av) Gore-Tex Graft Arm;  Surgeon: [name], MD;  Location: [place];  Service: Vascular;  Laterality: Left;   BIOPSY  11/14/2020   Procedure: BIOPSY;  Surgeon: Yetta Flock, MD;  Location: [place];  Service: Gastroenterology;;   BIOPSY  11/16/2020   Procedure: BIOPSY;  Surgeon: Yetta Flock, MD;  Location: [place];  Service: Gastroenterology;;   BIOPSY  02/22/2021   Procedure: BIOPSY;  Surgeon: Irving Copas., MD;  Location: [facility] ENDOSCOPY;  Service: Gastroenterology;;   CARDIAC CATHETERIZATION     CARDIOVERSION N/A 10/26/2019   Procedure: CARDIOVERSION;  Surgeon: [name], MD;  Location: [facility] ENDOSCOPY;  Service: Cardiovascular;  Laterality: N/A;   COLONOSCOPY W/ BIOPSIES AND POLYPECTOMY     COLONOSCOPY WITH PROPOFOL N/A 11/16/2020   Procedure: COLONOSCOPY WITH PROPOFOL;  Surgeon: Havery Moros,  Carlota Raspberry, MD;  Location[facility] ENDOSCOPY;  Service: Gastroenterology;  Laterality: N/A;   ENDOSCOPIC MUCOSAL RESECTION N/A 02/22/2021   Procedure: ENDOSCOPIC MUCOSAL RESECTION;  Surgeon: Rush Landmark Telford Nab., MD;  Location: [place];  Service: Gastroenterology;  Laterality: N/A;   ESOPHAGOGASTRODUODENOSCOPY (EGD) WITH PROPOFOL Left 11/14/2020   Procedure: ESOPHAGOGASTRODUODENOSCOPY (EGD) WITH PROPOFOL;  Surgeon: Yetta Flock, MD;  Location: [place];  Service: Gastroenterology;  Laterality: Left;    ESOPHAGOGASTRODUODENOSCOPY (EGD) WITH PROPOFOL N/A 02/22/2021   Procedure: ESOPHAGOGASTRODUODENOSCOPY (EGD) WITH PROPOFOL;  Surgeon: Rush Landmark Telford Nab., MD;  Location: [place];  Service: Gastroenterology;  Laterality: N/A;   FISTULA SUPERFICIALIZATION Left 01/21/2020   Procedure: FISTULA SUPERFICIALIZATION OF FISTULA;  Surgeon: [name], MD;  Location: [place];  Service: Vascular;  Laterality: Left;   FISTULA SUPERFICIALIZATION Left 02/01/7672   Procedure: PLICATION OF LEFT UPPER EXTREMITY ARTERIOVENOUS FISTULA;  Surgeon: Waynetta [name], MD;  Location: [place];  Service: Vascular;  Laterality: Left;   GIVENS CAPSULE STUDY N/A 11/16/2020   Procedure: GIVENS CAPSULE STUDY;  Surgeon: Yetta Flock, MD;  Location: [place];  Service: Gastroenterology;  Laterality: N/A;   HEMOSTASIS CLIP PLACEMENT  02/22/2021   Procedure: HEMOSTASIS CLIP PLACEMENT;  Surgeon: Rush Landmark Telford Nab., MD;  Location: [place];  Service: Gastroenterology;;   INSERTION OF DIALYSIS CATHETER Right 11/04/2019   Procedure: CONVERT TEMPORARY DIALYSIS CATHETER TO TUNNELED DIALYSIS CATHETER Right Internal Jugular.;  Surgeon: [name], MD;  Location: [place];  Service: Vascular;  Laterality: Right;   IR THORACENTESIS ASP PLEURAL SPACE W/IMG GUIDE  08/17/2020   MULTIPLE TOOTH EXTRACTIONS     POLYPECTOMY  11/16/2020   Procedure: POLYPECTOMY;  Surgeon: Yetta Flock, MD;  Location: [facility] ENDOSCOPY;  Service: Gastroenterology;;   REVISION OF ARTERIOVENOUS GORETEX GRAFT Left 01/21/2020   Procedure: REVISION OF ARTERIOVENOUS FISTULA WITH SIDE BRANCH LIGATION;  Surgeon: [name], MD;  Location: [place];  Service: Vascular;  Laterality: Left;   SUBMUCOSAL LIFTING INJECTION  02/22/2021   Procedure: SUBMUCOSAL LIFTING INJECTION;  Surgeon: Irving Copas., MD;  Location: [place];  Service: Gastroenterology;;   SUBMUCOSAL TATTOO INJECTION  02/22/2021   Procedure:  SUBMUCOSAL TATTOO INJECTION;  Surgeon: Irving Copas., MD;  Location: [place];  Service: Gastroenterology;;   TEE WITHOUT CARDIOVERSION N/A 10/26/2019   Procedure: TRANSESOPHAGEAL ECHOCARDIOGRAM (TEE);  Surgeon: [name], MD;  Location: [facility] ENDOSCOPY;  Service: Cardiovascular;  Laterality: N/A;   THORACENTESIS     x 2   THORACENTESIS N/A 11/07/2020   Procedure: Mathews Robinsons;  Surgeon: [name], MD;  Location: [facility] ENDOSCOPY;  Service: Pulmonary;  Laterality: N/A;   THORACENTESIS N/A 04/06/2021   Procedure: THORACENTESIS;  Surgeon: Freddi Starr, MD;  Location: [facility] ENDOSCOPY;  Service: Pulmonary;  Laterality: N/A;   UPPER ESOPHAGEAL ENDOSCOPIC ULTRASOUND (EUS) N/A 02/22/2021   Procedure: UPPER ESOPHAGEAL ENDOSCOPIC ULTRASOUND (EUS);  Surgeon: Irving Copas., MD;  Location: [place];  Service: Gastroenterology;  Laterality: N/A;    OB History   No obstetric history on file.      Home Medications    Prior to Admission medications   Medication Sig Start Date End Date Taking? Authorizing Provider  predniSONE (DELTASONE) 20 MG tablet Take 2 tablets (40 mg total) by mouth daily. 06/11/21  Yes Jonah Nestle, Leitha Schuller, NP  acetaminophen (TYLENOL) 325 MG tablet Take 2 tablets (650 mg total) by mouth every 4 (four) hours as needed for headache or mild pain. 11/05/19   Kinnie Feil, MD  albuterol (VENTOLIN HFA) 108 (90 Base) MCG/ACT inhaler Inhale 2 puffs into the lungs every 6 (six) hours as needed for wheezing or shortness of breath. 11/30/19   [name], NP  allopurinol (ZYLOPRIM) 300 MG tablet TAKE 1 TABLET BY MOUTH EVERY NIGHT AT BEDTIME Patient taking differently: Take 300 mg by mouth at bedtime. 03/23/21   [name], NP  atorvastatin (LIPITOR) 80 MG tablet TAKE 1 TABLET (80 MG TOTAL) BY MOUTH AT BEDTIME. 02/26/21   [name], MD  B Complex-C-Zn-Folic Acid (DIALYVITE 259-DGLO 15) 0.8 MG TABS Take 1 tablet by mouth daily. 01/10/21    [provider]  cetirizine (ZYRTEC) 10 MG tablet Take 10 mg by mouth daily as needed for allergies.    [provider]  cinacalcet (SENSIPAR) 30 MG tablet Take 30 mg by mouth daily with supper.  04/18/20   [provider]  ELIQUIS 5 MG TABS tablet TAKE 1 TABLET (5 MG TOTAL) BY MOUTH 2 (TWO) TIMES DAILY. 04/20/21   [name], MD  esomeprazole (NEXIUM) 40 MG capsule Take 1 capsule (40 mg total) by mouth 2 (two) times daily before a meal. 02/22/21 02/22/22  Mansouraty, Telford Nab., MD  ferric citrate (AURYXIA) 1 GM 210 MG(Fe) tablet Take 210 mg by mouth 3 (three) times daily with meals.    [provider]  furosemide (LASIX) 40 MG tablet Take 80 mg by mouth See admin instructions. Take 2 tablets (80 mg) by mouth twice daily on non-dialysis days - Sunday, Tuesday, Thursday    [provider]  gabapentin (NEURONTIN) 100 MG capsule Take 1 capsule (100 mg total) by mouth 3 (three) times daily. 04/24/21   [name], NP  lidocaine-prilocaine (EMLA) cream Apply 1 application topically See admin instructions. Apply topically to port access one hour prior to dialysis - Monday, Wednesday, Friday, Saturday 04/07/20   [provider]  metoprolol succinate (TOPROL-XL) 25 MG 24 hr tablet TAKE 1 TABLET BY MOUTH EVERY MORNING & TAKE 1 TABLET EVERY NIGHT AT BEDTIME WITH OR IMMEDIATELY FOLLOWING MEAL Patient taking differently: Take 25 mg by mouth in the morning and at bedtime. 03/27/21   [name], MD  midodrine (PROAMATINE) 10 MG tablet Take 1 tablet (10 mg total) by mouth daily. 11/20/20   Pahwani, Michell Heinrich, MD  montelukast (SINGULAIR) 10 MG tablet Take 1 tablet by mouth once daily Patient taking differently: Take 10 mg by mouth daily as needed (allergies). 11/28/20   [name], NP  Multiple Vitamins-Minerals (MULTIVITAMIN WITH MINERALS) tablet Take 1 tablet by mouth daily.    [provider]  Nutritional Supplements (,FEEDING  SUPPLEMENT, PROSOURCE PLUS) liquid Take 30 mLs by mouth 2 (two) times daily between meals. 11/19/20   Pahwani, Michell Heinrich, MD  oxyCODONE-acetaminophen (PERCOCET) 5-325 MG tablet Take 1 tablet by mouth every 6 (six) hours as needed for severe pain. 03/01/21   Rhyne, Hulen Shouts, PA-C  sucralfate (CARAFATE) 1 GM/10ML suspension Take 10 mLs (1 g total) by mouth 2 (two) times daily. 02/22/21 02/22/22  Mansouraty, Telford Nab., MD  triamcinolone 0.1% oint-Eucerin equivalent cream 1:1 mixture Apply topically 3 (three) times daily as needed. APPLY TO DRY AFFECTED AREAS AS NEEDED 3 TIMES ADAY 04/24/21   [name], NP  vitamin B-12 (CYANOCOBALAMIN) 1000 MCG tablet Take 1,000 mcg by mouth daily.    [provider]  vitamin C (VITAMIN C) 500 MG tablet Take 1 tablet (500 mg total) by mouth daily. 11/06/19  Kinnie Feil, MD    Family History Family History  Problem Relation Age of Onset   Seizures Father    CAD Father    Diabetes Sister    Lupus Sister    Hypertension Sister    Colon cancer Neg Hx    Esophageal cancer Neg Hx    Pancreatic cancer Neg Hx    Stomach cancer Neg Hx    Inflammatory bowel disease Neg Hx    Liver disease Neg Hx    Rectal cancer Neg Hx     Social History Social History   Tobacco Use   Smoking status: Former    Types: Cigarettes   Smokeless tobacco: Never  Vaping Use   Vaping Use: Never used  Substance Use Topics   Alcohol use: Not Currently   Drug use: Not Currently     Allergies   Black walnut flavor and Shellfish allergy   Review of Systems Review of Systems Defer to HPI   Physical Exam Triage Vital Signs ED Triage Vitals  Enc Vitals Group     BP 06/11/21 1450 (!) 86/54     Pulse Rate 06/11/21 1450 75     Resp 06/11/21 1450 18     Temp 06/11/21 1450 98.4 F (36.9 C)     Temp Source 06/11/21 1450 Oral     SpO2 06/11/21 1450 94 %     Weight --      Height --      Head Circumference --      Peak Flow --      Pain Score 06/11/21  1449 10     Pain Loc --      Pain Edu? --      Excl. in [place]? --    No data found.  Updated Vital Signs BP (!) 86/54 (BP Location: Right Arm)   Pulse 75   Temp 98.4 F (36.9 C) (Oral)   Resp 18   SpO2 94%   Visual Acuity Right Eye Distance:   Left Eye Distance:   Bilateral Distance:    Right Eye Near:   Left Eye Near:    Bilateral Near:     Physical Exam Constitutional:      Appearance: Normal appearance. She is normal weight.  Eyes:     Extraocular Movements: Extraocular movements intact.  Pulmonary:     Effort: Pulmonary effort is normal.  Musculoskeletal:     Cervical back: Normal and normal range of motion.     Thoracic back: Normal.     Lumbar back: Tenderness present. No swelling or spasms. Normal range of motion. Negative right straight leg raise test and negative left straight leg raise test.       Back:  Skin:    General: Skin is warm and dry.  Neurological:     Mental Status: She is alert and oriented to person, place, and time. Mental status is at baseline.  Psychiatric:        Mood and Affect: Mood normal.        Behavior: Behavior normal.     UC Treatments / Results  Labs (all labs ordered are listed, but only abnormal results are displayed) Labs Reviewed - No data to display  EKG   Radiology No results found.  Procedures Procedures (including critical care time)  Medications Ordered in UC Medications - No data to display  Initial Impression / Assessment and Plan / UC Course  I have reviewed the triage vital signs and the nursing  notes.  Pertinent labs & imaging results that were available during my care of the patient were reviewed by me and considered in my medical decision making (see chart for details). Acute bilateral low back pain without sciatica  Prednisone 40 mg daily for 5 days Otc tylenol for additional comfort heating pad 15 minute intervals Gentle stretching as tolerated  Ortho follow up prn Final Clinical  Impressions(s) / UC Diagnoses   Final diagnoses:  Acute bilateral low back pain without sciatica     Discharge Instructions      Your back pain today seems to be muscular. I have low suspicion that there is damage to bone due to lack of injury therefore will will treat conservatively, if pain persist after use of medication you may follow up with orthopedics, information below   Take 2 prednsione pills each morning for the next 5 days, can use tylenol 650 mg every 6 hours during day for additional comfort  Can apply heating pad to area in 15 minute intervals for additional comfort    ED Prescriptions     Medication Sig Dispense Auth. Provider   predniSONE (DELTASONE) 20 MG tablet Take 2 tablets (40 mg total) by mouth daily. 10 tablet Hans Eden, NP      PDMP not reviewed this encounter.   Hans Eden, [place] 06/11/21 [phone]

## 2021-06-11 NOTE — Discharge Instructions (Addendum)
Your back pain today seems to be muscular. I have low suspicion that there is damage to bone due to lack of injury therefore will will treat conservatively, if pain persist after use of medication you may follow up with orthopedics, information below   Take 2 prednsione pills each morning for the next 5 days, can use tylenol 650 mg every 6 hours during day for additional comfort  Can apply heating pad to area in 15 minute intervals for additional comfort

## 2021-06-11 NOTE — ED Triage Notes (Signed)
T reports right sided lower back pain, radiates to right leg x 1 day.

## 2021-06-11 NOTE — ED Notes (Signed)
BP reported to [place], NP

## 2021-06-18 ENCOUNTER — Encounter (HOSPITAL_COMMUNITY): Payer: Self-pay | Admitting: Vascular Surgery

## 2021-06-18 ENCOUNTER — Telehealth (INDEPENDENT_AMBULATORY_CARE_PROVIDER_SITE_OTHER): Payer: Self-pay

## 2021-06-18 NOTE — Telephone Encounter (Signed)
Copied from [place] [id number]. Topic: Appointment Scheduling - Scheduling Inquiry for Clinic >> Jun 18, 2021  4:04 PM Melissa Adams wrote: Reason for CRM: Pls FU with Pt daughter, Melissa Adams at [phone]. States Pt was in ER  last week due to lower back pain and was told to FU with PCP. 1st available was in August, states mother in pain, willing to do a phone call anything as prednisone from ER as wore off and either needs a referral to ortho or an appt sooner tha August  Please advice

## 2021-06-18 NOTE — Anesthesia Preprocedure Evaluation (Addendum)
Anesthesia Evaluation  Patient identified by MRN, date of birth, ID band Patient awake    Reviewed: Allergy & Precautions, NPO status , Patient's Chart, lab work & pertinent test results, reviewed documented beta blocker date and time   Airway Mallampati: II  TM Distance: >3 FB Neck ROM: Full    Dental  (+) Partial Lower, Partial Upper, Dental Advisory Given   Pulmonary shortness of breath and with exertion, asthma , sleep apnea , former smoker,  Intolerant of CPAP Recurrent right pleural effusion   Pulmonary exam normal breath sounds clear to auscultation + decreased breath sounds      Cardiovascular hypertension, Pt. on medications and Pt. on home beta blockers +CHF  + dysrhythmias Atrial Fibrillation  Rhythm:Irregular Rate:Normal  Echo (Limited 11/17/20): IMPRESSIONS  1. Left ventricular ejection fraction, by estimation, is 35 to 40%. The  left ventricle has moderately decreased function. The left ventricle  demonstrates global hypokinesis. There is mild concentric left ventricular  hypertrophy. Left ventricular  diastolic parameters are indeterminate.  2. Right ventricular systolic function is normal. The right ventricular  size is normal. There is moderately elevated pulmonary artery systolic  pressure.  3. The mitral valve is normal in structure. Mild mitral valve  regurgitation. No evidence of mitral stenosis.  4. Tricuspid valve regurgitation is moderate to severe.  5. There is restriction of the right and noncoronary cusps. Visually  there appears to be at least moderate aortic stenosis. LVOT VTI was not  recorded, so cannot calculate a dimensionless index. Cannot exclude  low-flow, low gradient aortic stenosis. The  aortic valve is calcified. There is moderate calcification of the aortic  valve. There is moderate thickening of the aortic valve. Aortic valve  regurgitation is not visualized. Mild to moderate  aortic valve  sclerosis/calcification is present, without any  evidence of aortic stenosis. Aortic valve mean gradient measures 9.0 mmHg.  Aortic valve Vmax measures 2.13 m/s.   EKG 11/27/2020 Atrial Fibrillation, Aberantly conducted PAC's, Anterior infarct  Cardiomyopathy   Neuro/Psych negative neurological ROS  negative psych ROS   GI/Hepatic (+) Cirrhosis       , Liver lesion Hx/o duodenal cacinoid   Endo/Other  Obesity Hyperlipidemia  Renal/GU ESRF and DialysisRenal diseaseLast dialysis  negative genitourinary   Musculoskeletal  (+) Arthritis , Osteoarthritis,    Abdominal (+) + obese,   Peds  Hematology  (+) anemia , Eliquis therapy- last dose 06/18/21   Anesthesia Other Findings   Reproductive/Obstetrics                         Anesthesia Physical Anesthesia Plan  ASA: 4  Anesthesia Plan: General   Post-op Pain Management:    Induction: Intravenous  PONV Risk Score and Plan: 3 and Treatment may vary due to age or medical condition, Propofol infusion and Ondansetron  Airway Management Planned: LMA and Oral ETT  Additional Equipment:   Intra-op Plan:   Post-operative Plan: Extubation in OR  Informed Consent: I have reviewed the patients History and Physical, chart, labs and discussed the procedure including the risks, benefits and alternatives for the proposed anesthesia with the patient or authorized representative who has indicated his/her understanding and acceptance.     Dental advisory given  Plan Discussed with: CRNA and Anesthesiologist  Anesthesia Plan Comments: (See PAT note written 06/18/2021 by Myra Gianotti, PA-C. )      Anesthesia Quick Evaluation

## 2021-06-18 NOTE — Progress Notes (Signed)
Anesthesia Chart Review: Melissa Adams   Case: [id number] Date/Time: 06/19/21 0745   Procedure: MRI WITH ANESTHESIA  ,LIVER WITH AND WITHOUT CONTRAST   Anesthesia type: General   Pre-op diagnosis: OTHER CIRRHOSIS OF LIVER,LIVER LESION   Location: [facility] OR RAD[facility]OLOG[facility] RO[facility] / [place] OR   Surgeons: Radiologist, Medication, MD       DISCUSSION: Patient is a 75 year old female scheduled for the above procedure. Updated note 05/30/21 by Dr. Bryan Lemma scanned under Media tab. Also recent exam 06/11/21 during ED visit for back pain (Rx Tylenol as needed, 5 day course prednisone).    History includes former smoker, chronic systolic CHF, nonischemic cardiomyopathy, pulmonary hypertension (PASP 43 mmHG 03/2020 echo), atrial fibrillation (10/11/19; failed DCCV 10/26/19), valvular disease (mild MR, mod-severe TR, possible moderate AS 10/2020), HTN, HLD, OSA (intolerant to CPAP mask but referred to Dr. 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   Last cardiology visit with Dr. Aundra Dubin 03/08/21 for CHF and non-ischemic CM follow-up. By notes, no significant CAD by 07/2019 cath in [place]. EF 09/2019 20-25% in setting of COVID and afib with RVR. Fall in EF possibly due to afib (tachy-mediated) or myocarditis. She was started on dialysis 09/2019, but still noted to be mildly volume overloaded on exam that day. "Would be reasonable to try to get more fluid off at HD." In regards to MR/TR, he noted that prior to 10/11/19 admission, MV/TV replacements were planned in [place] for moderate-severe MR and severe TR 07/2019.  Last echo 10/2020 showed moderate-severe TR and mild MR. 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 Toprol and Eliquis continued for afib management.    Anesthesia team to evaluate on the day of procedure. Last right thoracentesis and CXR 04/06/21. Hemodialysis MWF. 10/2020 echo showed LVEF 35-40%, global hypokinesis, moderately elevated PASP, mild MR, moderate-severe TR, question of moderate AS visually (LVOT VTI not recorded, cannot exclude low-flow low gradient AS, mean grad 9.0 mmHg). Last seen in HF Clinic in April 2022 with six month follow-up planned.  She is on low-dose beta-blocker and Eliquis for A. fib.  She is on midodrine for hypotension.   VS:  BP Readings from Last 3 Encounters:  06/11/21 (!) 86/54  04/24/21 101/64  04/12/21 (!) 92/54   Pulse Readings from Last 3 Encounters:  06/11/21 75  04/24/21 60  04/12/21 66     PROVIDERS: [name], NP is PCP  [name], MD is HF cardiologist Freda Jackson, MD is pulmonologist. Last visit with Geraldo Pitter, NP on 04/02/21. Right thoracentesis arranged for 04/06/21.  Gerrit Heck, DO is GI Nephrologist is with [employer]   LABS: For labs on arrival. Needs at least ISTAT given ESRD. Most recent lab results include: Lab Results  Component Value Date   WBC 4.9 05/10/2021   HGB 10.8 (L) 05/10/2021   HCT 34.0 (L) 05/10/2021   PLT 163.0 05/10/2021   GLUCOSE 99 05/10/2021   ALT 19 05/10/2021   AST 26 05/10/2021   NA 137 05/10/2021   K 4.0 05/10/2021   CL 95 (L) 05/10/2021   CREATININE 5.67 (  HH) 05/10/2021   BUN 29 (H) 05/10/2021   CO2 29 05/10/2021   HGBA1C 5.1 04/24/2021     OTHER: 04/20/21 Overnight oximetry on RA: Per 05/02/21 note by Geraldo Pitter, NP, "Please let patient know overnight oximetry on room air 04/20/2021 showed that patient spent 52 minutes with SPO2 less than 88%  (SPO2 LOW 82%/Basal SPO2 91%). She qualifies for nocturnal oxygen. Needs 2L"  EGD/EUS 02/22/21: EGD Impression: - No gross lesions in esophagus. Z-line irregular, 43 cm from the incisors. - 2 cm hiatal hernia. - Gastritis. Biopsied. - Non-bleeding duodenal ulcers with a clean ulcer base (Forrest Class III) in bulb. - Duodenitis. Biopsied. - Submucosal nodule found in the duodenum. After EUS completed, removal was accomplished via Snare Band Ligation EMR. Clips (MR conditional) were placed. Tattoo placed on contralateral wall for marking purposes. EGD Impression: - An intramural (subepithelial) lesion was found in the second portion of the duodenum. The lesion appeared to originate from within the luminal interface/superficial mucosa (Layer 1) and deep mucosa (Layer 2). A tissue diagnosis was obtained prior to this exam. This is consistent with a neuroendocrine tumor.  IMAGES: [place] Abd 05/17/21: IMPRESSION: 1. Multiple hypoechoic liver lesions in the left and right lobe of the liver, measuring up to 2.6 cm. These are suspicious for hepatocellular carcinoma in the setting of cirrhosis. Recommend multiphasic contrast enhanced MRI to further evaluate. 2. Gallbladder sludge and a small calculus in the gallbladder. Gallbladder wall thickening is nonspecific in the absence of a sonographic Murphy sign. No biliary ductal dilatation. 3. Right pleural effusion.   1V PCXR (post right thoracentesis) 04/06/21: FINDINGS: The cardiomediastinal silhouette is unchanged and enlarged in contour. Decreased RIGHT pleural effusion with small residual RIGHT pleural effusion. No significant pneumothorax. Unchanged RIGHT perihilar opacity. Increased linear opacity of the RIGHT lower lung along the minor fissure, likely atelectasis. Visualized abdomen is unremarkable. Multilevel degenerative changes of the thoracic spine. IMPRESSION: Decreased pleural effusion status post thoracocentesis. Mildly increased  likely atelectasis.    EKG:   CV: Echo (Limited 11/17/20): IMPRESSIONS   1. Left ventricular ejection fraction, by estimation, is 35 to 40%. The  left ventricle has moderately decreased function. The left ventricle  demonstrates global hypokinesis. There is mild concentric left ventricular  hypertrophy. Left ventricular  diastolic parameters are indeterminate.   2. Right ventricular systolic function is normal. The right ventricular  size is normal. There is moderately elevated pulmonary artery systolic  pressure.   3. The mitral valve is normal in structure. Mild mitral valve  regurgitation. No evidence of mitral stenosis.   4. Tricuspid valve regurgitation is moderate to severe.   5. There is restriction of the right and noncoronary cusps. Visually  there appears to be at least moderate aortic stenosis. LVOT VTI was not  recorded, so cannot calculate a dimensionless index. Cannot exclude  low-flow, low gradient aortic stenosis. The  aortic valve is calcified. There is moderate calcification of the aortic  valve. There is moderate thickening of the aortic valve. Aortic valve  regurgitation is not visualized. Mild to moderate aortic valve  sclerosis/calcification is present, without any  evidence of aortic stenosis. Aortic valve mean gradient measures 9.0 mmHg.  Aortic valve Vmax measures 2.13 m/s.   6. The inferior vena cava is dilated in size with <50% respiratory  variability, suggesting right atrial pressure of 15 mmHg.    Echo 04/20/20: IMPRESSIONS   1. Left ventricular ejection fraction, by estimation, is 30 to 35%. The  left ventricle has moderate  to severely decreased function. The left  ventricle demonstrates global hypokinesis. The left ventricular internal  cavity size was mildly dilated. Left  ventricular diastolic parameters are indeterminate.   2. Right ventricular systolic function is mildly reduced. The right  ventricular size is moderately enlarged. There is  mildly elevated  pulmonary artery systolic pressure. D-shaped interventricular septum  consistent with RV pressure/volume overload. PA  systolic pressure 43 mmHg.   3. Left atrial size was moderately dilated.   4. Right atrial size was moderately dilated.   5. The mitral valve is normal in structure. Moderate mitral valve  regurgitation. No evidence of mitral stenosis.   6. Tricuspid valve regurgitation is moderate.   7. The aortic valve is tricuspid. Aortic valve regurgitation is not  visualized. Mild aortic valve sclerosis is present, with no evidence of  aortic valve stenosis.   8. The inferior vena cava is dilated in size with <50% respiratory  variability, suggesting right atrial pressure of 15 mmHg.    Cardiac cath 9/910/20 [facility]): Findings: RCA: The RCA was dominant to the posterior circulation.  There were luminal irregularities in the distal RCA.  There were luminal irregularities in the proximal RCA. Impression: Normal coronary angiography.  There was no evidence of significant coronary artery disease.   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   Cardiomyopathy ([place])    CHF (congestive heart failure) ([place])    Chronic kidney disease    Edema    History of hemodialysis    Hyperlipidemia    Hypertension    Hypotension    on midodrine (as of 06/15/21)   Pulmonary hypertension ([facility])    Recurrent right pleural effusion    As of 06/15/21: s/p thoracenteses 05/05/20, 06/11/20, 08/17/20, 11/18/20, 04/06/21   Sleep apnea    Tricuspid regurgitation     Past Surgical History:  Procedure Laterality Date   A/V FISTULAGRAM Left 05/23/2020   Procedure: A/V FISTULAGRAM;  Surgeon: [name], MD;  Location: [place] CV LAB;  Service: Cardiovascular;  Laterality: Left;   A/V FISTULAGRAM N/A 12/21/2020   Procedure: A/V FISTULAGRAM - Left Upper Arm;  Surgeon: [name], MD;  Location: [place] CV LAB;  Service: Cardiovascular;  Laterality: N/A;   ABDOMINAL HYSTERECTOMY     AV FISTULA PLACEMENT Left 11/04/2019   Procedure: Insertion Of Arteriovenous (Av) Gore-Tex Graft Arm;  Surgeon: [name], MD;  Location: [place];  Service: Vascular;  Laterality: Left;   BIOPSY  11/14/2020   Procedure: BIOPSY;  Surgeon: Yetta Flock, MD;  Location: [place];  Service: Gastroenterology;;   BIOPSY  11/16/2020   Procedure: BIOPSY;  Surgeon: Yetta Flock, MD;  Location: [employer];  Service: Gastroenterology;;   BIOPSY  02/22/2021   Procedure: BIOPSY;  Surgeon: Irving Copas., MD;  Location: [facility] ENDOSCOPY;  Service: Gastroenterology;;   CARDIAC CATHETERIZATION     CARDIOVERSION N/A 10/26/2019   Procedure: CARDIOVERSION;  Surgeon: [name], MD;  Location: [facility] ENDOSCOPY;  Service: Cardiovascular;  Laterality: N/A;   COLONOSCOPY W/ BIOPSIES AND POLYPECTOMY     COLONOSCOPY WITH PROPOFOL N/A 11/16/2020   Procedure: COLONOSCOPY WITH PROPOFOL;  Surgeon: Yetta Flock, MD;  Location: [place];  Service: Gastroenterology;  Laterality: N/A;   ENDOSCOPIC MUCOSAL RESECTION N/A 02/22/2021   Procedure: ENDOSCOPIC MUCOSAL RESECTION;  Surgeon: Rush Landmark Telford Nab., MD;  Location[facility] ENDOSCOPY;  Service: Gastroenterology;  Laterality: N/A;   ESOPHAGOGASTRODUODENOSCOPY (EGD) WITH PROPOFOL Left 11/14/2020   Procedure: ESOPHAGOGASTRODUODENOSCOPY (EGD) WITH PROPOFOL;  Surgeon: Yetta Flock, MD;  Location: [place];  Service: Gastroenterology;  Laterality: Left;   ESOPHAGOGASTRODUODENOSCOPY (EGD) WITH PROPOFOL N/A 02/22/2021   Procedure: ESOPHAGOGASTRODUODENOSCOPY (EGD) WITH PROPOFOL;  Surgeon: Rush Landmark Telford Nab., MD;  Location: [place];  Service: Gastroenterology;  Laterality: N/A;   FISTULA SUPERFICIALIZATION Left 01/21/2020   Procedure: FISTULA SUPERFICIALIZATION OF FISTULA;  Surgeon: [name], MD;   Location: [place];  Service: Vascular;  Laterality: Left;   FISTULA SUPERFICIALIZATION Left 05/27/4286   Procedure: PLICATION OF LEFT UPPER EXTREMITY ARTERIOVENOUS FISTULA;  Surgeon: Waynetta [name], MD;  Location: [place];  Service: Vascular;  Laterality: Left;   GIVENS CAPSULE STUDY N/A 11/16/2020   Procedure: GIVENS CAPSULE STUDY;  Surgeon: Yetta Flock, MD;  Location: [place];  Service: Gastroenterology;  Laterality: N/A;   HEMOSTASIS CLIP PLACEMENT  02/22/2021   Procedure: HEMOSTASIS CLIP PLACEMENT;  Surgeon: Rush Landmark Telford Nab., MD;  Location: [place];  Service: Gastroenterology;;   INSERTION OF DIALYSIS CATHETER Right 11/04/2019   Procedure: CONVERT TEMPORARY DIALYSIS CATHETER TO TUNNELED DIALYSIS CATHETER Right Internal Jugular.;  Surgeon: [name], MD;  Location: [place];  Service: Vascular;  Laterality: Right;   IR THORACENTESIS ASP PLEURAL SPACE W/IMG GUIDE  08/17/2020   MULTIPLE TOOTH EXTRACTIONS     POLYPECTOMY  11/16/2020   Procedure: POLYPECTOMY;  Surgeon: Yetta Flock, MD;  [facility] ENDOS[facility];  Service: Gastroenterology;;   REVISION OF ARTERIOVENOUS GORETEX GRAFT Left 01/21/2020   Procedure: REVISION OF ARTERIOVENOUS FISTULA WITH SIDE BRANCH LIGATION;  Surgeon: [name], MD;  Location: [place];  Service: Vascular;  Laterality: Left;   SUBMUCOSAL LIFTING INJECTION  02/22/2021   Procedure: SUBMUCOSAL LIFTING INJECTION;  Surgeon: Irving Copas., MD;  Location: [place];  Service: Gastroenterology;;   SUBMUCOSAL TATTOO INJECTION  02/22/2021   Procedure: SUBMUCOSAL TATTOO INJECTION;  Surgeon: Irving Copas., MD;  Location: [place];  Service: Gastroenterology;;   TEE WITHOUT CARDIOVERSION N/A 10/26/2019   Procedure: TRANSESOPHAGEAL ECHOCARDIOGRAM (TEE);  Surgeon: [name], MD;  Location: [facility] ENDOSCOPY;  Service: Cardiovascular;  Laterality: N/A;   THORACENTESIS     x 2   THORACENTESIS N/A 11/07/2020    Procedure: Mathews Robinsons;  Surgeon: [name], MD;  Location: [facility] ENDOSCOPY;  Service: Pulmonary;  Laterality: N/A;   THORACENTESIS N/A 04/06/2021   Procedure: THORACENTESIS;  Surgeon: Freddi Starr, MD;  Location: [facility] ENDOSCOPY;  Service: Pulmonary;  Laterality: N/A;   UPPER ESOPHAGEAL ENDOSCOPIC ULTRASOUND (EUS) N/A 02/22/2021   Procedure: UPPER ESOPHAGEAL ENDOSCOPIC ULTRASOUND (EUS);  Surgeon: Irving Copas., MD;  Location: [place];  Service: Gastroenterology;  Laterality: N/A;    MEDICATIONS: No current facility-administered medications for this encounter.    acetaminophen (TYLENOL) 325 MG tablet   albuterol (VENTOLIN HFA) 108 (90 Base) MCG/ACT inhaler   allopurinol (ZYLOPRIM) 300 MG tablet   atorvastatin (LIPITOR) 80 MG tablet   B Complex-C-Zn-Folic Acid (DIALYVITE 681-LXBW 15) 0.8 MG TABS   cetirizine (ZYRTEC) 10 MG tablet   cinacalcet (SENSIPAR) 30 MG tablet   ELIQUIS 5 MG TABS tablet   esomeprazole (NEXIUM) 40 MG capsule   ferric citrate (AURYXIA) 1 GM 210 MG(Fe) tablet   furosemide (LASIX) 40 MG tablet   gabapentin (NEURONTIN) 100 MG capsule   lidocaine-prilocaine (EMLA) cream   metoprolol succinate (TOPROL-XL) 25 MG 24 hr tablet   midodrine (  PROAMATINE) 10 MG tablet   montelukast (SINGULAIR) 10 MG tablet   Multiple Vitamins-Minerals (MULTIVITAMIN WITH MINERALS) tablet   Nutritional Supplements (,FEEDING SUPPLEMENT, PROSOURCE PLUS) liquid   oxyCODONE-acetaminophen (PERCOCET) 5-325 MG tablet   predniSONE (DELTASONE) 20 MG tablet   sucralfate (CARAFATE) 1 GM/10ML suspension   triamcinolone 0.1% oint-Eucerin equivalent cream 1:1 mixture   vitamin B-12 (CYANOCOBALAMIN) 1000 MCG tablet   vitamin C (VITAMIN C) 500 MG tablet    Myra Gianotti, PA-C Surgical Short Stay/Anesthesiology [facility] Phone [phone] [facility] Phone [phone] 06/18/2021 2:57 PM

## 2021-06-18 NOTE — Progress Notes (Signed)
   Anesthesia review: yes  -------------  SDW INSTRUCTIONS:  Your procedure is scheduled on Tuesday 7/26. Please report to [facility] Main Entrance "A" at 0530 A.M., and check in at the Admitting office. Call this number if you have problems the morning of surgery: [phone]   Remember: Do not eat or drink after midnight the night before your surgery  Medications to take morning of surgery with a sip of water include: esomeprazole (NEXIUM)  gabapentin (NEURONTIN) metoprolol succinate (TOPROL-XL)   If needed: acetaminophen (TYLENOL) albuterol (VENTOLIN HFA)  cetirizine (ZYRTEC montelukast (SINGULAIR)  oxyCODONE-acetaminophen (PERCOCET)   As of today, STOP taking any Aspirin (unless otherwise instructed by your surgeon), Aleve, Naproxen, Ibuprofen, Motrin, Advil, Goody's, BC's, all herbal medications, fish oil, and all vitamins.    The Morning of Surgery Do not wear jewelry, make-up or nail polish. Do not wear lotions, powders, or perfumes, or deodorant Do not shave 48 hours prior to surgery.   Men may shave face and neck. Do not bring valuables to the hospital. [facility] is not responsible for any belongings or valuables.  If you are a smoker, DO NOT Smoke 24 hours prior to surgery  If you wear a CPAP at night please bring your mask the morning of surgery   Remember that you must have someone to transport you home after your surgery, and remain with you for 24 hours if you are discharged the same day.  Please bring cases for contacts, glasses, hearing aids, dentures or bridgework because it cannot be worn into surgery.   Patients discharged the day of surgery will not be allowed to drive home.   Please shower the NIGHT BEFORE/MORNING OF SURGERY (use antibacterial soap like DIAL soap if possible). Wear comfortable clothes the morning of surgery. Oral Hygiene is also important to reduce your risk of infection.  Remember - BRUSH YOUR TEETH THE MORNING OF SURGERY WITH  YOUR REGULAR TOOTHPASTE  Patient denies shortness of breath, fever, cough and chest pain.

## 2021-06-19 ENCOUNTER — Encounter (HOSPITAL_COMMUNITY): Admission: RE | Disposition: A | Discharge status: Home / Self Care | Payer: Self-pay | Source: Home / Self Care

## 2021-06-19 ENCOUNTER — Ambulatory Visit (HOSPITAL_COMMUNITY)
Admission: RE | Admit: 2021-06-19 | Discharge: 2021-06-19 | Disposition: A | Discharge status: Home / Self Care | Payer: Medicare Other | Attending: Gastroenterology | Admitting: Gastroenterology

## 2021-06-19 ENCOUNTER — Ambulatory Visit (HOSPITAL_COMMUNITY): Payer: Medicare Other | Admitting: Anesthesiology

## 2021-06-19 ENCOUNTER — Ambulatory Visit (HOSPITAL_COMMUNITY)
Admission: RE | Admit: 2021-06-19 | Discharge: 2021-06-19 | Disposition: A | Discharge status: Home / Self Care | Payer: Medicare Other | Source: Ambulatory Visit | Attending: Gastroenterology | Admitting: Gastroenterology

## 2021-06-19 ENCOUNTER — Other Ambulatory Visit: Payer: Self-pay

## 2021-06-19 ENCOUNTER — Encounter (HOSPITAL_COMMUNITY): Payer: Self-pay

## 2021-06-19 DIAGNOSIS — E669 Obesity, unspecified: Secondary | ICD-10-CM | POA: Insufficient documentation

## 2021-06-19 DIAGNOSIS — K769 Liver disease, unspecified: Secondary | ICD-10-CM

## 2021-06-19 DIAGNOSIS — Z8616 Personal history of COVID-19: Secondary | ICD-10-CM | POA: Diagnosis not present

## 2021-06-19 DIAGNOSIS — I428 Other cardiomyopathies: Secondary | ICD-10-CM | POA: Diagnosis not present

## 2021-06-19 DIAGNOSIS — I132 Hypertensive heart and chronic kidney disease with heart failure and with stage 5 chronic kidney disease, or end stage renal disease: Secondary | ICD-10-CM | POA: Insufficient documentation

## 2021-06-19 DIAGNOSIS — Z992 Dependence on renal dialysis: Secondary | ICD-10-CM | POA: Insufficient documentation

## 2021-06-19 DIAGNOSIS — I5022 Chronic systolic (congestive) heart failure: Secondary | ICD-10-CM | POA: Insufficient documentation

## 2021-06-19 DIAGNOSIS — D3502 Benign neoplasm of left adrenal gland: Secondary | ICD-10-CM | POA: Diagnosis not present

## 2021-06-19 DIAGNOSIS — Z87891 Personal history of nicotine dependence: Secondary | ICD-10-CM | POA: Insufficient documentation

## 2021-06-19 DIAGNOSIS — Z6835 Body mass index (BMI) 35.0-35.9, adult: Secondary | ICD-10-CM | POA: Diagnosis not present

## 2021-06-19 DIAGNOSIS — M899 Disorder of bone, unspecified: Secondary | ICD-10-CM | POA: Insufficient documentation

## 2021-06-19 DIAGNOSIS — Z79899 Other long term (current) drug therapy: Secondary | ICD-10-CM | POA: Diagnosis not present

## 2021-06-19 DIAGNOSIS — I10 Essential (primary) hypertension: Secondary | ICD-10-CM | POA: Insufficient documentation

## 2021-06-19 DIAGNOSIS — I4891 Unspecified atrial fibrillation: Secondary | ICD-10-CM | POA: Insufficient documentation

## 2021-06-19 DIAGNOSIS — Z7901 Long term (current) use of anticoagulants: Secondary | ICD-10-CM | POA: Diagnosis not present

## 2021-06-19 DIAGNOSIS — R188 Other ascites: Secondary | ICD-10-CM | POA: Insufficient documentation

## 2021-06-19 DIAGNOSIS — I083 Combined rheumatic disorders of mitral, aortic and tricuspid valves: Secondary | ICD-10-CM | POA: Insufficient documentation

## 2021-06-19 DIAGNOSIS — J45909 Unspecified asthma, uncomplicated: Secondary | ICD-10-CM | POA: Diagnosis not present

## 2021-06-19 DIAGNOSIS — N186 End stage renal disease: Secondary | ICD-10-CM | POA: Insufficient documentation

## 2021-06-19 DIAGNOSIS — J90 Pleural effusion, not elsewhere classified: Secondary | ICD-10-CM | POA: Insufficient documentation

## 2021-06-19 DIAGNOSIS — K7469 Other cirrhosis of liver: Secondary | ICD-10-CM

## 2021-06-19 HISTORY — DX: Hypotension, unspecified: I95.9

## 2021-06-19 HISTORY — PX: RADIOLOGY WITH ANESTHESIA: SHX6223

## 2021-06-19 HISTORY — DX: Pleural effusion, not elsewhere classified: J90

## 2021-06-19 LAB — COMPREHENSIVE METABOLIC PANEL
ALT: 37 U/L (ref 0–44)
AST: 52 U/L — ABNORMAL HIGH (ref 15–41)
Albumin: 3.4 g/dL — ABNORMAL LOW (ref 3.5–5.0)
Alkaline Phosphatase: 215 U/L — ABNORMAL HIGH (ref 38–126)
Anion gap: 12 (ref 5–15)
BUN: 39 mg/dL — ABNORMAL HIGH (ref 8–23)
CO2: 31 mmol/L (ref 22–32)
Calcium: 9.2 mg/dL (ref 8.9–10.3)
Chloride: 92 mmol/L — ABNORMAL LOW (ref 98–111)
Creatinine, Ser: 5.78 mg/dL — ABNORMAL HIGH (ref 0.44–1.00)
GFR, Estimated: 7 mL/min — ABNORMAL LOW (ref >60)
Glucose, Bld: 93 mg/dL (ref 70–99)
Potassium: 5 mmol/L (ref 3.5–5.1)
Sodium: 135 mmol/L (ref 135–145)
Total Bilirubin: 1.1 mg/dL (ref 0.3–1.2)
Total Protein: 6.5 g/dL (ref 6.5–8.1)

## 2021-06-19 LAB — POCT I-STAT, CHEM 8
BUN: 48 mg/dL — ABNORMAL HIGH (ref 8–23)
Calcium, Ion: 0.97 mmol/L — ABNORMAL LOW (ref 1.15–1.40)
Chloride: 96 mmol/L — ABNORMAL LOW (ref 98–111)
Creatinine, Ser: 5.9 mg/dL — ABNORMAL HIGH (ref 0.44–1.00)
Glucose, Bld: 89 mg/dL (ref 70–99)
HCT: 42 % (ref 36.0–46.0)
Hemoglobin: 14.3 g/dL (ref 12.0–15.0)
Potassium: 5.4 mmol/L — ABNORMAL HIGH (ref 3.5–5.1)
Sodium: 133 mmol/L — ABNORMAL LOW (ref 135–145)
TCO2: 33 mmol/L — ABNORMAL HIGH (ref 22–32)

## 2021-06-19 SURGERY — MRI WITH ANESTHESIA
Anesthesia: General

## 2021-06-19 MED ORDER — DEXAMETHASONE SODIUM PHOSPHATE 10 MG/ML IJ SOLN
INTRAMUSCULAR | Status: DC | PRN
  Administered 2021-06-19: 10 mg via INTRAVENOUS

## 2021-06-19 MED ORDER — PHENYLEPHRINE HCL-NACL 10-0.9 MG/250ML-% IV SOLN
INTRAVENOUS | Status: DC | PRN
  Administered 2021-06-19: 40 ug/min via INTRAVENOUS

## 2021-06-19 MED ORDER — GADOBUTROL 1 MMOL/ML IV SOLN
10.0000 mL | Freq: Once | INTRAVENOUS | Status: AC | PRN
  Administered 2021-06-19: 10 mL via INTRAVENOUS

## 2021-06-19 MED ORDER — PHENYLEPHRINE 40 MCG/ML (10ML) SYRINGE FOR IV PUSH (FOR BLOOD PRESSURE SUPPORT)
PREFILLED_SYRINGE | INTRAVENOUS | Status: DC | PRN
  Administered 2021-06-19 (×2): 120 ug via INTRAVENOUS
  Administered 2021-06-19: 40 ug via INTRAVENOUS

## 2021-06-19 MED ORDER — FENTANYL CITRATE (PF) 250 MCG/5ML IJ SOLN
INTRAMUSCULAR | Status: DC | PRN
  Administered 2021-06-19: 100 ug via INTRAVENOUS

## 2021-06-19 MED ORDER — SODIUM CHLORIDE 0.9 % IV SOLN: INTRAVENOUS | Status: DC | PRN

## 2021-06-19 MED ORDER — LIDOCAINE 2% (20 MG/ML) 5 ML SYRINGE
INTRAMUSCULAR | Status: DC | PRN
  Administered 2021-06-19: 60 mg via INTRAVENOUS

## 2021-06-19 MED ORDER — ORAL CARE MOUTH RINSE: 15.0000 mL | Freq: Once | OROMUCOSAL | Status: DC

## 2021-06-19 MED ORDER — PROPOFOL 10 MG/ML IV BOLUS
INTRAVENOUS | Status: DC | PRN
  Administered 2021-06-19: 100 mg via INTRAVENOUS

## 2021-06-19 MED ORDER — CHLORHEXIDINE GLUCONATE 0.12 % MT SOLN: 15.0000 mL | Freq: Once | OROMUCOSAL | Status: DC

## 2021-06-19 MED ORDER — ONDANSETRON HCL 4 MG/2ML IJ SOLN
INTRAMUSCULAR | Status: DC | PRN
  Administered 2021-06-19: 4 mg via INTRAVENOUS

## 2021-06-19 MED ORDER — FENTANYL CITRATE (PF) 100 MCG/2ML IJ SOLN
INTRAMUSCULAR | Status: AC
  Filled 2021-06-19: qty 2

## 2021-06-19 MED ORDER — LACTATED RINGERS IV SOLN: INTRAVENOUS | Status: DC

## 2021-06-19 MED ORDER — ROCURONIUM BROMIDE 10 MG/ML (PF) SYRINGE
PREFILLED_SYRINGE | INTRAVENOUS | Status: DC | PRN
  Administered 2021-06-19: 50 mg via INTRAVENOUS

## 2021-06-19 NOTE — Transfer of Care (Signed)
Immediate Anesthesia Transfer of Care Note  Patient: Melissa Adams  Procedure(s) Performed: MRI WITH ANESTHESIA  ,LIVER WITH AND WITHOUT CONTRAST  Patient Location: PACU  Anesthesia Type:General  Level of Consciousness: awake and patient cooperative  Airway & Oxygen Therapy: Patient Spontanous Breathing and Patient connected to nasal cannula oxygen  Post-op Assessment: Report given to RN and Post -op Vital signs reviewed and stable  Post vital signs: Reviewed and stable  Last Vitals:  Vitals Value Taken Time  BP 98/72 06/19/21 1025  Temp 36.3 C 06/19/21 1025  Pulse 116 06/19/21 1027  Resp 13 06/19/21 1027  SpO2 93 % 06/19/21 1027  Vitals shown include unvalidated device data.  Last Pain:  Vitals:   06/19/21 0607  TempSrc: Oral         Complications: No notable events documented.

## 2021-06-19 NOTE — Progress Notes (Signed)
Attempted to reach pharm tech for med rec multiple times, no answer. Medications confirmed with pt daughter, [name] [facility]), for MRI today, daughter also brought med list for med rec.

## 2021-06-19 NOTE — Telephone Encounter (Signed)
Please advise 

## 2021-06-19 NOTE — Anesthesia Postprocedure Evaluation (Signed)
Anesthesia Post Note  Patient: Melissa Adams  Procedure(s) Performed: MRI WITH ANESTHESIA  ,LIVER WITH AND WITHOUT CONTRAST     Patient location during evaluation: PACU Anesthesia Type: General Level of consciousness: awake and alert and oriented Pain management: pain level controlled Vital Signs Assessment: post-procedure vital signs reviewed and stable Respiratory status: spontaneous breathing, nonlabored ventilation, respiratory function stable and patient connected to nasal cannula oxygen Cardiovascular status: blood pressure returned to baseline and stable Postop Assessment: no apparent nausea or vomiting Anesthetic complications: no   No notable events documented.  Last Vitals:  Vitals:   06/19/21 1040 06/19/21 1055  BP: 91/65 103/61  Pulse: (!) 110 (!) 101  Resp: 16 16  Temp:    SpO2: 98% 92%    Last Pain:  Vitals:   06/19/21 1055  TempSrc:   PainSc: 0-No pain                 Calise Dunckel A.

## 2021-06-19 NOTE — Anesthesia Procedure Notes (Signed)
Procedure Name: Intubation Date/Time: 06/19/2021 8:34 AM Performed by: Thelma Comp, CRNA Pre-anesthesia Checklist: Patient identified, Emergency Drugs available, Suction available and Patient being monitored Patient Re-evaluated:Patient Re-evaluated prior to induction Oxygen Delivery Method: Circle System Utilized Preoxygenation: Pre-oxygenation with 100% oxygen Induction Type: IV induction Ventilation: Mask ventilation without difficulty Laryngoscope Size: Glidescope and 3 Grade View: Grade I Tube type: Oral Tube size: 7.0 mm Number of attempts: 1 Airway Equipment and Method: Stylet Placement Confirmation: ETT inserted through vocal cords under direct vision, positive ETCO2 and breath sounds checked- equal and bilateral Secured at: 21 cm Tube secured with: Tape Dental Injury: Teeth and Oropharynx as per pre-operative assessment

## 2021-06-20 ENCOUNTER — Encounter (HOSPITAL_COMMUNITY): Payer: Self-pay | Admitting: Radiology

## 2021-06-20 ENCOUNTER — Other Ambulatory Visit (INDEPENDENT_AMBULATORY_CARE_PROVIDER_SITE_OTHER): Payer: Self-pay | Admitting: Primary Care

## 2021-06-20 NOTE — Telephone Encounter (Signed)
Patient is currently in the hospital for her back

## 2021-06-25 ENCOUNTER — Telehealth: Payer: Self-pay | Admitting: Hematology and Oncology

## 2021-06-25 NOTE — Telephone Encounter (Signed)
Scheduled appt per 8/1 sch msg. Called pt, no answer. Left msg with appt date and time.

## 2021-06-26 ENCOUNTER — Ambulatory Visit: Payer: Medicare Other | Admitting: Hematology and Oncology

## 2021-06-26 ENCOUNTER — Other Ambulatory Visit: Payer: Medicare Other

## 2021-06-28 ENCOUNTER — Inpatient Hospital Stay: Payer: Medicare Other | Attending: Hematology and Oncology | Admitting: Hematology and Oncology

## 2021-06-28 ENCOUNTER — Inpatient Hospital Stay: Payer: Medicare Other

## 2021-06-28 ENCOUNTER — Other Ambulatory Visit: Payer: Self-pay

## 2021-06-28 ENCOUNTER — Ambulatory Visit: Payer: Medicare Other | Admitting: Gastroenterology

## 2021-06-28 ENCOUNTER — Encounter (HOSPITAL_COMMUNITY): Payer: Self-pay | Admitting: Radiology

## 2021-06-28 VITALS — BP 97/56 | HR 47 | Temp 97.6°F | Resp 18 | Wt 196.2 lb

## 2021-06-28 DIAGNOSIS — K769 Liver disease, unspecified: Secondary | ICD-10-CM | POA: Diagnosis not present

## 2021-06-28 DIAGNOSIS — Z992 Dependence on renal dialysis: Secondary | ICD-10-CM | POA: Diagnosis not present

## 2021-06-28 DIAGNOSIS — R160 Hepatomegaly, not elsewhere classified: Secondary | ICD-10-CM

## 2021-06-28 DIAGNOSIS — D3A010 Benign carcinoid tumor of the duodenum: Secondary | ICD-10-CM | POA: Diagnosis not present

## 2021-06-28 DIAGNOSIS — Z87891 Personal history of nicotine dependence: Secondary | ICD-10-CM | POA: Insufficient documentation

## 2021-06-28 DIAGNOSIS — D3A8 Other benign neuroendocrine tumors: Secondary | ICD-10-CM | POA: Insufficient documentation

## 2021-06-28 NOTE — Progress Notes (Signed)
Patient Name  Melissa, Adams Legal Sex  Female DOB  [birth year] SSN  [id number] Address  [address] [name] [address] Phone  [phone] [facility]  [phone] (Mobile) *Preferred*     RE: US BIOPSY (LIVER) Received: Today [name], MD  Garth Bigness D Ok to hold Eliquis x 2 days.         Previous Messages    ----- Message -----  From: Garth Bigness D  Sent: 06/28/2021   5:07 PM EDT  To: [name], MD  Subject: FW: US BIOPSY (LIVER)                           Dr. Aundra Dubin there is an order for patient to have a biopsy. Order was placed by Dr Demetria Pore, patient is on Eliquis and will need to hold for two days prior to biopsy once approved. Please advise if okay to hold. Thanks Aniceto Boss  ----- Message -----  From: Garth Bigness D  Sent: 06/28/2021   4:59 PM EDT  To: Ir Procedure Requests  Subject: US BIOPSY (LIVER)                               Procedure:  US BIOPSY (LIVER)   Reason:  Liver masses, biopsy of liver lesion, unclear primary cancer   History:  MR, [place] in computer   Provider:  Ledell Peoples IV   Provider Contact:  [phone]

## 2021-06-29 ENCOUNTER — Encounter (HOSPITAL_COMMUNITY): Payer: Self-pay | Admitting: Radiology

## 2021-06-29 NOTE — Progress Notes (Signed)
Patient Name  Mazell, Aylesworth Legal Sex  Female DOB  Jan 21, 1946 SSN  [id number] Address  [address] [name] [address] Phone  [phone] [facility])  [phone] (Mobile) *Preferred*     RE: US BIOPSY (LIVER) Received: Today Suttle, Rosanne Ashing, MD  Garth Bigness D Approved for ultrasound guided liver mass biopsy.   Contrast enhanced ultrasound use for biopsy guidance could be beneficial.   Dylan         Previous Messages    ----- Message -----  From: Garth Bigness D  Sent: 06/28/2021   4:59 PM EDT  To: Ir Procedure Requests  Subject: US BIOPSY (LIVER)                               Procedure:  US BIOPSY (LIVER)   Reason:  Liver masses, biopsy of liver lesion, unclear primary cancer   History:  MR, [place] in computer   Provider:  Ledell Peoples IV   Provider Contact:  [phone]

## 2021-07-03 ENCOUNTER — Ambulatory Visit
Admission: EM | Admit: 2021-07-03 | Discharge: 2021-07-03 | Disposition: A | Discharge status: Home / Self Care | Payer: Medicare Other | Attending: Internal Medicine | Admitting: Internal Medicine

## 2021-07-03 ENCOUNTER — Other Ambulatory Visit: Payer: Self-pay

## 2021-07-03 DIAGNOSIS — M5442 Lumbago with sciatica, left side: Secondary | ICD-10-CM

## 2021-07-03 MED ORDER — PREDNISONE 20 MG PO TABS: 40.0000 mg | ORAL_TABLET | Freq: Every day | ORAL | 0 refills | Status: AC

## 2021-07-03 NOTE — ED Provider Notes (Signed)
[facility]    CSN: [id number] Arrival date & time: 07/03/21  1830      History   Chief Complaint Chief Complaint  Patient presents with   Back Pain    HPI Melissa Adams is a 75 y.o. female.   Patient presents with bilateral lower back pain that radiates down left leg that has been present for approximately 2 days.  Denies any numbness or tingling.  Denies any urinary burning, frequency, urinary or bowel incontinence.  Patient was seen at urgent care in July for similar back pain.  Patient reports that she has had frequent flareups of sciatica recently after sitting in chair for long periods at dialysis.  Patient had dialysis on Monday when pain started.  Patient was to follow-up with primary care physician for further evaluation of back pain from previous urgent care visit but has been unable to see PCP due to unavailability of appointments.  Denies injury to back or any recent falls.   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   Cardiomyopathy ([place])    CHF (congestive heart failure) ([place])    Chronic kidney disease    Edema    History of hemodialysis    Hyperlipidemia    Hypertension    Hypotension    on midodrine (as of 06/15/21)   Pulmonary hypertension (H[facility])    Recurrent right pleural effusion    As of 06/15/21: s/p thoracenteses 05/05/20, 06/11/20, 08/17/20, 11/18/20, 04/06/21   Sleep apnea    Tricuspid regurgitation     Patient Active Problem List   Diagnosis Date Noted   Abnormal findings on esophagogastroduodenoscopy (EGD) 01/27/2021   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   Benign carcinoid tumor of duodenum    Benign neoplasm of colon    Cirrhosis of liver with ascites ([place])    Melena    GI bleed 11/13/2020   Upper GI bleeding 08/65/7846   Chronic systolic CHF (congestive heart failure) ([place]) 11/13/2020   Acute blood loss  anemia 11/13/2020   Chronic anticoagulation 11/13/2020   Pleural effusion 06/11/2020   Aortic atherosclerosis ([place]) 06/11/2020   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   Acute on chronic systolic heart failure (HCC)    Pressure injury of skin 10/22/2019   Dyspnea    COVID-19 virus infection    CHF exacerbation ([place]) 10/12/2019   Acute CHF (congestive heart failure) ([place]) 10/11/2019   Atrial fibrillation, chronic ([place]) 10/11/2019   Essential hypertension 10/11/2019   Sleep apnea 10/11/2019   AKI (acute kidney injury) ([place]) 10/11/2019   Gout 10/11/2019   Anemia 10/11/2019    Past Surgical History:  Procedure Laterality Date   A/V FISTULAGRAM Left 05/23/2020   Procedure: A/V FISTULAGRAM;  Surgeon: [name], MD;  Location: [place] [facility];  Service: Cardiovascular;  Laterality: Left;   A/V FISTULAGRAM N/A 12/21/2020   Procedure: A/V FISTULAGRAM - Left Upper Arm;  Surgeon: [name], MD;  Location: [place] [facility];  Service: Cardiovascular;  Laterality: N/A;   ABDOMINAL HYSTERECTOMY     AV FISTULA PLACEMENT Left 11/04/2019   Procedure: Insertion Of Arteriovenous (Av) Gore-Tex Graft Arm;  Surgeon: [name], MD;  Location: [place];  Service: Vascular;  Laterality: Left;   BIOPSY  11/14/2020   Procedure: BIOPSY;  Surgeon: Yetta Flock, MD;  Location: [facility]  ENDOSCOPY;  Service: Gastroenterology;;   BIOPSY  11/16/2020   Procedure: BIOPSY;  Surgeon: Yetta Flock, MD;  Location: [place];  Service: Gastroenterology;;   BIOPSY  02/22/2021   Procedure: BIOPSY;  Surgeon: Irving Copas., MD;  Location: [place];  Service: Gastroenterology;;   CARDIAC CATHETERIZATION     CARDIOVERSION N/A 10/26/2019   Procedure: CARDIOVERSION;  Surgeon: [name], MD;  Location: [facility] ENDOSCOPY;  Service: Cardiovascular;  Laterality: N/A;   COLONOSCOPY W/ BIOPSIES AND POLYPECTOMY     COLONOSCOPY WITH PROPOFOL N/A 11/16/2020   Procedure: COLONOSCOPY  WITH PROPOFOL;  Surgeon: Yetta Flock, MD;  Location: [place];  Service: Gastroenterology;  Laterality: N/A;   ENDOSCOPIC MUCOSAL RESECTION N/A 02/22/2021   Procedure: ENDOSCOPIC MUCOSAL RESECTION;  Surgeon: Rush Landmark Telford Nab., MD;  Location: [place];  Service: Gastroenterology;  Laterality: N/A;   ESOPHAGOGASTRODUODENOSCOPY (EGD) WITH PROPOFOL Left 11/14/2020   Procedure: ESOPHAGOGASTRODUODENOSCOPY (EGD) WITH PROPOFOL;  Surgeon: Yetta Flock, MD;  Location: [place];  Service: Gastroenterology;  Laterality: Left;   ESOPHAGOGASTRODUODENOSCOPY (EGD) WITH PROPOFOL N/A 02/22/2021   Procedure: ESOPHAGOGASTRODUODENOSCOPY (EGD) WITH PROPOFOL;  Surgeon: Rush Landmark Telford Nab., MD;  Location: [place];  Service: Gastroenterology;  Laterality: N/A;   FISTULA SUPERFICIALIZATION Left 01/21/2020   Procedure: FISTULA SUPERFICIALIZATION OF FISTULA;  Surgeon: [name], MD;  Location: [place];  Service: Vascular;  Laterality: Left;   FISTULA SUPERFICIALIZATION Left 4/0/9811   Procedure: PLICATION OF LEFT UPPER EXTREMITY ARTERIOVENOUS FISTULA;  Surgeon: Waynetta [name], MD;  Location: [place];  Service: Vascular;  Laterality: Left;   GIVENS CAPSULE STUDY N/A 11/16/2020   Procedure: GIVENS CAPSULE STUDY;  Surgeon: Yetta Flock, MD;  Location: [place];  Service: Gastroenterology;  Laterality: N/A;   HEMOSTASIS CLIP PLACEMENT  02/22/2021   Procedure: HEMOSTASIS CLIP PLACEMENT;  Surgeon: Rush Landmark Telford Nab., MD;  Location: [place];  Service: Gastroenterology;;   INSERTION OF DIALYSIS CATHETER Right 11/04/2019   Procedure: CONVERT TEMPORARY DIALYSIS CATHETER TO TUNNELED DIALYSIS CATHETER Right Internal Jugular.;  Surgeon: [name], MD;  Location: [place];  Service: Vascular;  Laterality: Right;   IR THORACENTESIS ASP PLEURAL SPACE W/IMG GUIDE  08/17/2020   MULTIPLE TOOTH EXTRACTIONS     POLYPECTOMY  11/16/2020   Procedure:  POLYPECTOMY;  Surgeon: Yetta Flock, MD;  Location: [place];  Service: Gastroenterology;;   RADIOLOGY WITH ANESTHESIA N/A 06/19/2021   Procedure: MRI WITH ANESTHESIA  ,LIVER WITH AND WITHOUT CONTRAST;  Surgeon: Radiologist, Medication, MD;  Location: [place];  Service: Radiology;  Laterality: N/A;   REVISION OF ARTERIOVENOUS GORETEX GRAFT Left 01/21/2020   Procedure: REVISION OF ARTERIOVENOUS FISTULA WITH SIDE BRANCH LIGATION;  Surgeon: [name], MD;  Location: [place];  Service: Vascular;  Laterality: Left;   SUBMUCOSAL LIFTING INJECTION  02/22/2021   Procedure: SUBMUCOSAL LIFTING INJECTION;  Surgeon: Irving Copas., MD;  Location: [place];  Service: Gastroenterology;;   SUBMUCOSAL TATTOO INJECTION  02/22/2021   Procedure: SUBMUCOSAL TATTOO INJECTION;  Surgeon: Irving Copas., MD;  Location: [place];  Service: Gastroenterology;;   TEE WITHOUT CARDIOVERSION N/A 10/26/2019   Procedure: TRANSESOPHAGEAL ECHOCARDIOGRAM (TEE);  Surgeon: [name], MD;  Location: [facility] ENDOSCOPY;  Service: Cardiovascular;  Laterality: N/A;   THORACENTESIS     x 2   THORACENTESIS N/A 11/07/2020   Procedure: Mathews Robinsons;  Surgeon: [name], MD;  Location: [facility] ENDOSCOPY;  Service: Pulmonary;  Laterality: N/A;   THORACENTESIS N/A 04/06/2021   Procedure: Mathews Robinsons;  Surgeon:  Freddi Starr, MD;  Location: [facility] ENDOSCOPY;  Service: Pulmonary;  Laterality: N/A;   UPPER ESOPHAGEAL ENDOSCOPIC ULTRASOUND (EUS) N/A 02/22/2021   Procedure: UPPER ESOPHAGEAL ENDOSCOPIC ULTRASOUND (EUS);  Surgeon: Irving Copas., MD;  Location: [place];  Service: Gastroenterology;  Laterality: N/A;    OB History   No obstetric history on file.      Home Medications    Prior to Admission medications   Medication Sig Start Date End Date Taking? Authorizing Provider  predniSONE (DELTASONE) 20 MG tablet Take 2 tablets (40 mg total) by mouth daily for 5 days. 07/03/21  07/08/21 Yes Odis Luster, FNP  acetaminophen (TYLENOL) 325 MG tablet Take 2 tablets (650 mg total) by mouth every 4 (four) hours as needed for headache or mild pain. 11/05/19   Kinnie Feil, MD  albuterol (VENTOLIN HFA) 108 (90 Base) MCG/ACT inhaler Inhale 2 puffs into the lungs every 6 (six) hours as needed for wheezing or shortness of breath. 11/30/19   [name], NP  allopurinol (ZYLOPRIM) 300 MG tablet TAKE 1 TABLET BY MOUTH EVERY NIGHT AT BEDTIME Patient taking differently: Take 300 mg by mouth at bedtime. 03/23/21   [name], NP  atorvastatin (LIPITOR) 80 MG tablet TAKE 1 TABLET (80 MG TOTAL) BY MOUTH AT BEDTIME. 02/26/21   [name], MD  B Complex-C-Zn-Folic Acid (DIALYVITE 629-[id number] 15) 0.8 MG TABS Take 1 tablet by mouth daily. 01/10/21   [provider]  cetirizine (ZYRTEC) 10 MG tablet Take 10 mg by mouth daily as needed for allergies.    [provider]  cinacalcet (SENSIPAR) 30 MG tablet Take 30 mg by mouth daily with supper.  04/18/20   [provider]  ELIQUIS 5 MG TABS tablet TAKE 1 TABLET (5 MG TOTAL) BY MOUTH 2 (TWO) TIMES DAILY. 04/20/21   [name], MD  esomeprazole (NEXIUM) 40 MG capsule Take 1 capsule (40 mg total) by mouth 2 (two) times daily before a meal. 02/22/21 02/22/22  Mansouraty, Telford Nab., MD  ferric citrate (AURYXIA) 1 GM 210 MG(Fe) tablet Take 210 mg by mouth 3 (three) times daily with meals.    [provider]  furosemide (LASIX) 40 MG tablet Take 80 mg by mouth See admin instructions. Take 2 tablets (80 mg) by mouth twice daily on non-dialysis days - Sunday, Tuesday, Thursday    [provider]  gabapentin (NEURONTIN) 100 MG capsule Take 1 capsule (100 mg total) by mouth 3 (three) times daily. 04/24/21   [name], NP  lidocaine-prilocaine (EMLA) cream Apply 1 application topically See admin instructions. Apply topically to port access one hour prior to dialysis - Monday,  Wednesday, Friday, Saturday 04/07/20   [provider]  metoprolol succinate (TOPROL-XL) 25 MG 24 hr tablet TAKE 1 TABLET BY MOUTH EVERY MORNING & TAKE 1 TABLET EVERY NIGHT AT BEDTIME WITH OR IMMEDIATELY FOLLOWING MEAL Patient taking differently: Take 25 mg by mouth in the morning and at bedtime. 03/27/21   [name], MD  midodrine (PROAMATINE) 10 MG tablet Take 1 tablet (10 mg total) by mouth daily. 11/20/20   Pahwani, Michell Heinrich, MD  montelukast (SINGULAIR) 10 MG tablet Take 1 tablet by mouth once daily Patient taking differently: Take 10 mg by mouth daily as needed (allergies). 11/28/20   [name], NP  Multiple Vitamins-Minerals (MULTIVITAMIN WITH MINERALS) tablet Take 1 tablet by mouth daily.    [provider]  Nutritional Supplements (,FEEDING SUPPLEMENT, PROSOURCE PLUS) liquid  Take 30 mLs by mouth 2 (two) times daily between meals. 11/19/20   Pahwani, Michell Heinrich, MD  triamcinolone 0.1% oint-Eucerin equivalent cream 1:1 mixture Apply topically 3 (three) times daily as needed. APPLY TO DRY AFFECTED AREAS AS NEEDED 3 TIMES ADAY 04/24/21   [name], NP  vitamin B-12 (CYANOCOBALAMIN) 1000 MCG tablet Take 1,000 mcg by mouth daily.    [provider]  vitamin C (VITAMIN C) 500 MG tablet Take 1 tablet (500 mg total) by mouth daily. 11/06/19   Kinnie Feil, MD    Family History Family History  Problem Relation Age of Onset   Seizures Father    CAD Father    Diabetes Sister    Lupus Sister    Hypertension Sister    Colon cancer Neg Hx    Esophageal cancer Neg Hx    Pancreatic cancer Neg Hx    Stomach cancer Neg Hx    Inflammatory bowel disease Neg Hx    Liver disease Neg Hx    Rectal cancer Neg Hx     Social History Social History   Tobacco Use   Smoking status: Former    Types: Cigarettes   Smokeless tobacco: Never  Vaping Use   Vaping Use: Never used  Substance Use Topics   Alcohol use: Not Currently   Drug use: Not Currently      Allergies   Black walnut flavor and Shellfish allergy   Review of Systems Review of Systems  Musculoskeletal:  Positive for back pain.  Per HPI  Physical Exam Triage Vital Signs ED Triage Vitals  Enc Vitals Group     BP 07/03/21 1904 (!) 101/51     Pulse Rate 07/03/21 1904 (!) 58     Resp 07/03/21 1904 18     Temp 07/03/21 1904 98.2 F (36.8 C)     Temp Source 07/03/21 1904 Oral     SpO2 07/03/21 1904 99 %     Weight --      Height --      Head Circumference --      Peak Flow --      Pain Score 07/03/21 1906 10     Pain Loc --      Pain Edu? --      Excl. in [place]? --    No data found.  Updated Vital Signs BP (!) 101/51 (BP Location: Right Arm)   Pulse (!) 58   Temp 98.2 F (36.8 C) (Oral)   Resp 18   SpO2 99%   Visual Acuity Right Eye Distance:   Left Eye Distance:   Bilateral Distance:    Right Eye Near:   Left Eye Near:    Bilateral Near:     Physical Exam Constitutional:      Appearance: Normal appearance.     Comments: Patient walking with walker for stability due to pain.  Does not normally walk with walker.  HENT:     Head: Normocephalic and atraumatic.  Eyes:     Extraocular Movements: Extraocular movements intact.     Conjunctiva/sclera: Conjunctivae normal.  Cardiovascular:     Rate and Rhythm: Normal rate and regular rhythm.     Pulses: Normal pulses.     Heart sounds: Normal heart sounds.  Pulmonary:     Effort: Pulmonary effort is normal.     Breath sounds: Normal breath sounds.  Musculoskeletal:     Cervical back: Normal.     Thoracic back: Normal.  Lumbar back: Tenderness present. No swelling, deformity or bony tenderness. Positive left straight leg raise test. Negative right straight leg raise test.     Comments: Tenderness to palpation to paraspinal muscles and bilateral lower back.  No bony tenderness directly over spine.  Neurovascular intact.  Neurological:     General: No focal deficit present.     Mental Status: She  is alert and oriented to person, place, and time. Mental status is at baseline.     Cranial Nerves: Cranial nerves are intact.     Sensory: Sensation is intact.     Motor: Motor function is intact.     Coordination: Coordination is intact.     Gait: Gait is intact.     Deep Tendon Reflexes: Reflexes are normal and symmetric.  Psychiatric:        Mood and Affect: Mood normal.        Behavior: Behavior normal.        Thought Content: Thought content normal.        Judgment: Judgment normal.     UC Treatments / Results  Labs (all labs ordered are listed, but only abnormal results are displayed) Labs Reviewed - No data to display  EKG   Radiology No results found.  Procedures Procedures (including critical care time)  Medications Ordered in UC Medications - No data to display  Initial Impression / Assessment and Plan / UC Course  I have reviewed the triage vital signs and the nursing notes.  Pertinent labs & imaging results that were available during my care of the patient were reviewed by me and considered in my medical decision making (see chart for details).     Lower back pain consistent with flareup of sciatica.  Limited options as far as treatment due to patient's end-stage renal disease.  Will treat with another short low-dose course of prednisone due to successful treatment previously.  Patient is not able to take NSAIDs, so patient can take Tylenol as needed for pain.  Patient to alternate ice and heat application.  Advised patient and caregiver that is important to follow-up with orthopedic spine specialist for further evaluation and management.  Patient and caregiver were provided with contact information for spine specialist to follow-up tomorrow to make an appointment as well as orthopedist if unable to get in with spine specialist.  Advised patient and caregiver to take patient to the hospital if urinary burning, frequency, urinary or bowel incontinence develop or if  pain significantly worsens.Discussed strict return precautions. Patient and caregiver verbalized understanding and are agreeable with plan.  Final Clinical Impressions(s) / UC Diagnoses   Final diagnoses:  Acute bilateral low back pain with left-sided sciatica     Discharge Instructions      You have been prescribed prednisone steroid to decrease inflammation associated with back pain with sciatica.  Please alternate ice and heat application to area of pain.  Take patient to the hospital if urinary burning, frequency, urinary or bowel incontinence develop or if pain becomes more severe.  You may take Tylenol in addition to prednisone to help with pain.     ED Prescriptions     Medication Sig Dispense Auth. Provider   predniSONE (DELTASONE) 20 MG tablet Take 2 tablets (40 mg total) by mouth daily for 5 days. 10 tablet Odis Luster, FNP      PDMP not reviewed this encounter.   Odis Luster, FNP 07/03/21 1942

## 2021-07-03 NOTE — ED Triage Notes (Signed)
Pt c/o lower back pain radiating down lt leg x1 wk. Pain worsen today. Denies injury.

## 2021-07-03 NOTE — Discharge Instructions (Addendum)
You have been prescribed prednisone steroid to decrease inflammation associated with back pain with sciatica.  Please alternate ice and heat application to area of pain.  Take patient to the hospital if urinary burning, frequency, urinary or bowel incontinence develop or if pain becomes more severe.  You may take Tylenol in addition to prednisone to help with pain.

## 2021-07-05 ENCOUNTER — Other Ambulatory Visit: Payer: Self-pay | Admitting: Internal Medicine

## 2021-07-05 NOTE — Progress Notes (Signed)
[place] [phone]   [phone]  PROGRESS NOTE  Patient Care Team: [name], NP as PCP - General (Internal Medicine) [name], MD as PCP - Advanced Heart Failure (Cardiology) [name], MD as PCP - Cardiology (Cardiology) Center, [facility], Verita Lamb, MD as Consulting Physician (Hematology and Oncology)  Hematological/Oncological History # Duodenal Neuroendocrine Tumor s/p Resection # Numerous Lesions of the Liver, Unclear Etiology 11/14/2020: EGD found a nodule in the duodenum. This was biopsied. Final pathology showed a small focus of well differentiated neuroendocrine tumor. Ki 67 <3%.  11/17/2020: CT abdomen w contrast showed no acute abdominal/pelvis abnormality, though a large right sided pleural effusion was noted.  02/22/2021: EGD with EUS showed a submucosal nodule in the duodenum. This was removed via [facility] EMR. Pathology confirmed a well differentiated low grade neuroendocrine tumor.  Ki-67 <1%. 04/12/2021: establish care with Dr. Lorenso Courier   Interval History:  Melissa Adams 75 y.o. female with medical history significant for duodenal neuroendocrine tumor status post resection who presents for a follow up visit. The patient's last visit was on 04/12/2021. In the interim since the last visit she had an MRI on 06/19/2021 which showed innumerable enhancing lesions identified throughout both lobes of the liver concerning for metastatic disease.  On exam today Melissa Adams is accompanied by her daughter.  She notes that she normally undergoes dialysis on Mondays Wednesdays and Fridays.  She has been gaining fluid unfortunately.  She notes that she has been having some pain in her hip and her back and her leg.  She went to urgent care about 2 weeks ago and was given prednisone in order to try to help with this.  Her weight has declined slightly from 109 pounds down to 196 pounds.  She reports  she has not been having any issues with nausea, vomiting, or diarrhea.  Full 10 point ROS is listed below.  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   Cardiomyopathy ([place])    CHF (congestive heart failure) ([place])    Chronic kidney disease    Edema    History of hemodialysis    Hyperlipidemia    Hypertension    Hypotension    on midodrine (as of 06/15/21)   Pulmonary hypertension (HCC)    Recurrent right pleural effusion    As of 06/15/21: s/p thoracenteses 05/05/20, 06/11/20, 08/17/20, 11/18/20, 04/06/21   Sleep apnea    Tricuspid regurgitation     SURGICAL HISTORY: Past Surgical History:  Procedure Laterality Date   A/V FISTULAGRAM Left 05/23/2020   Procedure: A/V FISTULAGRAM;  Surgeon: [name], MD;  Location: [place] [facility];  Service: Cardiovascular;  Laterality: Left;   A/V FISTULAGRAM N/A 12/21/2020   Procedure: A/V FISTULAGRAM - Left Upper Arm;  Surgeon: [name], MD;  Location: [place] [facility];  Service: Cardiovascular;  Laterality: N/A;   ABDOMINAL HYSTERECTOMY     AV FISTULA PLACEMENT Left 11/04/2019   Procedure: Insertion Of Arteriovenous (Av) Gore-Tex Graft Arm;  Surgeon: [name], MD;  Location: [place];  Service: Vascular;  Laterality: Left;   BIOPSY  11/14/2020   Procedure: BIOPSY;  Surgeon: Yetta Flock, MD;  Location: [facility] ENDOSCOPY;  Service: Gastroenterology;;   BIOPSY  11/16/2020   Procedure: BIOPSY;  Surgeon: Strawberry Cellar  P, MD;  Location: [place];  Service: Gastroenterology;;   BIOPSY  02/22/2021   Procedure: BIOPSY;  Surgeon: Irving Copas., MD;  Location: [place];  Service: Gastroenterology;;   CARDIAC CATHETERIZATION     CARDIOVERSION N/A 10/26/2019   Procedure: CARDIOVERSION;  Surgeon: [name], MD;  Location: [facility] ENDOSCOPY;  Service: Cardiovascular;  Laterality: N/A;    COLONOSCOPY W/ BIOPSIES AND POLYPECTOMY     COLONOSCOPY WITH PROPOFOL N/A 11/16/2020   Procedure: COLONOSCOPY WITH PROPOFOL;  Surgeon: Yetta Flock, MD;  Location: [place];  Service: Gastroenterology;  Laterality: N/A;   ENDOSCOPIC MUCOSAL RESECTION N/A 02/22/2021   Procedure: ENDOSCOPIC MUCOSAL RESECTION;  Surgeon: Rush Landmark Telford Nab., MD;  Location: [place];  Service: Gastroenterology;  Laterality: N/A;   ESOPHAGOGASTRODUODENOSCOPY (EGD) WITH PROPOFOL Left 11/14/2020   Procedure: ESOPHAGOGASTRODUODENOSCOPY (EGD) WITH PROPOFOL;  Surgeon: Yetta Flock, MD;  Location: [place];  Service: Gastroenterology;  Laterality: Left;   ESOPHAGOGASTRODUODENOSCOPY (EGD) WITH PROPOFOL N/A 02/22/2021   Procedure: ESOPHAGOGASTRODUODENOSCOPY (EGD) WITH PROPOFOL;  Surgeon: Rush Landmark Telford Nab., MD;  Location: [place];  Service: Gastroenterology;  Laterality: N/A;   FISTULA SUPERFICIALIZATION Left 01/21/2020   Procedure: FISTULA SUPERFICIALIZATION OF FISTULA;  Surgeon: [name], MD;  Location: [place];  Service: Vascular;  Laterality: Left;   FISTULA SUPERFICIALIZATION Left 01/31/7563   Procedure: PLICATION OF LEFT UPPER EXTREMITY ARTERIOVENOUS FISTULA;  Surgeon: Waynetta [name], MD;  Location: [place];  Service: Vascular;  Laterality: Left;   GIVENS CAPSULE STUDY N/A 11/16/2020   Procedure: GIVENS CAPSULE STUDY;  Surgeon: Yetta Flock, MD;  Location: [place];  Service: Gastroenterology;  Laterality: N/A;   HEMOSTASIS CLIP PLACEMENT  02/22/2021   Procedure: HEMOSTASIS CLIP PLACEMENT;  Surgeon: Rush Landmark Telford Nab., MD;  Location: [place];  Service: Gastroenterology;;   INSERTION OF DIALYSIS CATHETER Right 11/04/2019   Procedure: CONVERT TEMPORARY DIALYSIS CATHETER TO TUNNELED DIALYSIS CATHETER Right Internal Jugular.;  Surgeon: [name], MD;  Location: [place];  Service: Vascular;  Laterality: Right;   IR THORACENTESIS ASP PLEURAL  SPACE W/IMG GUIDE  08/17/2020   MULTIPLE TOOTH EXTRACTIONS     POLYPECTOMY  11/16/2020   Procedure: POLYPECTOMY;  Surgeon: Yetta Flock, MD;  Location: [place];  Service: Gastroenterology;;   RADIOLOGY WITH ANESTHESIA N/A 06/19/2021   Procedure: MRI WITH ANESTHESIA  ,LIVER WITH AND WITHOUT CONTRAST;  Surgeon: Radiologist, Medication, MD;  Location: [place];  Service: Radiology;  Laterality: N/A;   REVISION OF ARTERIOVENOUS GORETEX GRAFT Left 01/21/2020   Procedure: REVISION OF ARTERIOVENOUS FISTULA WITH SIDE BRANCH LIGATION;  Surgeon: [name], MD;  Location: [place];  Service: Vascular;  Laterality: Left;   SUBMUCOSAL LIFTING INJECTION  02/22/2021   Procedure: SUBMUCOSAL LIFTING INJECTION;  Surgeon: Irving Copas., MD;  Location: [place];  Service: Gastroenterology;;   SUBMUCOSAL TATTOO INJECTION  02/22/2021   Procedure: SUBMUCOSAL TATTOO INJECTION;  Surgeon: Irving Copas., MD;  Location: [place];  Service: Gastroenterology;;   TEE WITHOUT CARDIOVERSION N/A 10/26/2019   Procedure: TRANSESOPHAGEAL ECHOCARDIOGRAM (TEE);  Surgeon: [name], MD;  Location: [facility] ENDOSCOPY;  Service: Cardiovascular;  Laterality: N/A;   THORACENTESIS     x 2   THORACENTESIS N/A 11/07/2020   Procedure: Mathews Robinsons;  Surgeon: [name], MD;  Location: [facility] ENDOSCOPY;  Service: Pulmonary;  Laterality: N/A;   THORACENTESIS N/A 04/06/2021   Procedure: THORACENTESIS;  Surgeon: Freddi Starr, MD;  Location: [facility] ENDOSCOPY;  Service: Pulmonary;  Laterality: N/A;   UPPER  ESOPHAGEAL ENDOSCOPIC ULTRASOUND (EUS) N/A 02/22/2021   Procedure: UPPER ESOPHAGEAL ENDOSCOPIC ULTRASOUND (EUS);  Surgeon: Irving Copas., MD;  Location: [place];  Service: Gastroenterology;  Laterality: N/A;    SOCIAL HISTORY: Social History   Socioeconomic History   Marital status: Widowed    Spouse name: Not on file   Number of children: Not on file   Years of education:  Not on file   Highest education level: Not on file  Occupational History   Not on file  Tobacco Use   Smoking status: Former    Types: Cigarettes   Smokeless tobacco: Never  Vaping Use   Vaping Use: Never used  Substance and Sexual Activity   Alcohol use: Not Currently   Drug use: Not Currently   Sexual activity: Not Currently  Other Topics Concern   Not on file  Social History Narrative   Not on file   Social Determinants of Health   Financial Resource Strain: Not on file  Food Insecurity: Not on file  Transportation Needs: Not on file  Physical Activity: Not on file  Stress: Not on file  Social Connections: Not on file  Intimate Partner Violence: Not on file    FAMILY HISTORY: Family History  Problem Relation Age of Onset   Seizures Father    CAD Father    Diabetes Sister    Lupus Sister    Hypertension Sister    Colon cancer Neg Hx    Esophageal cancer Neg Hx    Pancreatic cancer Neg Hx    Stomach cancer Neg Hx    Inflammatory bowel disease Neg Hx    Liver disease Neg Hx    Rectal cancer Neg Hx     ALLERGIES:  is allergic to black walnut flavor and shellfish allergy.  MEDICATIONS:  Current Outpatient Medications  Medication Sig Dispense Refill   acetaminophen (TYLENOL) 325 MG tablet Take 2 tablets (650 mg total) by mouth every 4 (four) hours as needed for headache or mild pain.     albuterol (VENTOLIN HFA) 108 (90 Base) MCG/ACT inhaler Inhale 2 puffs into the lungs every 6 (six) hours as needed for wheezing or shortness of breath. 6.7 g 1   allopurinol (ZYLOPRIM) 300 MG tablet TAKE 1 TABLET BY MOUTH EVERY NIGHT AT BEDTIME (Patient taking differently: Take 300 mg by mouth at bedtime.) 30 tablet 3   atorvastatin (LIPITOR) 80 MG tablet TAKE 1 TABLET (80 MG TOTAL) BY MOUTH AT BEDTIME. 30 tablet 5   B Complex-C-Zn-Folic Acid (DIALYVITE 229-NLGX 15) 0.8 MG TABS Take 1 tablet by mouth daily.     cetirizine (ZYRTEC) 10 MG tablet Take 10 mg by mouth daily as needed  for allergies.     cinacalcet (SENSIPAR) 30 MG tablet Take 30 mg by mouth daily with supper.      ELIQUIS 5 MG TABS tablet TAKE 1 TABLET (5 MG TOTAL) BY MOUTH 2 (TWO) TIMES DAILY. 60 tablet 3   esomeprazole (NEXIUM) 40 MG capsule Take 1 capsule (40 mg total) by mouth 2 (two) times daily before a meal. 30 capsule 1   ferric citrate (AURYXIA) 1 GM 210 MG(Fe) tablet Take 210 mg by mouth 3 (three) times daily with meals.     furosemide (LASIX) 40 MG tablet Take 80 mg by mouth See admin instructions. Take 2 tablets (80 mg) by mouth twice daily on non-dialysis days - Sunday, Tuesday, Thursday     gabapentin (NEURONTIN) 100 MG capsule Take 1 capsule (100 mg total) by  mouth 3 (three) times daily. 90 capsule 1   lidocaine-prilocaine (EMLA) cream Apply 1 application topically See admin instructions. Apply topically to port access one hour prior to dialysis - Monday, Wednesday, Friday, Saturday     metoprolol succinate (TOPROL-XL) 25 MG 24 hr tablet TAKE 1 TABLET BY MOUTH EVERY MORNING & TAKE 1 TABLET EVERY NIGHT AT BEDTIME WITH OR IMMEDIATELY FOLLOWING MEAL (Patient taking differently: Take 25 mg by mouth in the morning and at bedtime.) 60 tablet 11   midodrine (PROAMATINE) 10 MG tablet Take 1 tablet (10 mg total) by mouth daily. 30 tablet 0   montelukast (SINGULAIR) 10 MG tablet Take 1 tablet by mouth once daily (Patient taking differently: Take 10 mg by mouth daily as needed (allergies).) 90 tablet 0   Multiple Vitamins-Minerals (MULTIVITAMIN WITH MINERALS) tablet Take 1 tablet by mouth daily.     Nutritional Supplements (,FEEDING SUPPLEMENT, PROSOURCE PLUS) liquid Take 30 mLs by mouth 2 (two) times daily between meals.     predniSONE (DELTASONE) 20 MG tablet Take 2 tablets (40 mg total) by mouth daily for 5 days. 10 tablet 0   triamcinolone 0.1% oint-Eucerin equivalent cream 1:1 mixture Apply topically 3 (three) times daily as needed. APPLY TO DRY AFFECTED AREAS AS NEEDED 3 TIMES ADAY 360 g 1   vitamin B-12  (CYANOCOBALAMIN) 1000 MCG tablet Take 1,000 mcg by mouth daily.     vitamin C (VITAMIN C) 500 MG tablet Take 1 tablet (500 mg total) by mouth daily. 30 tablet 0   No current facility-administered medications for this visit.    REVIEW OF SYSTEMS:   Constitutional: ( - ) fevers, ( - )  chills , ( - ) night sweats Eyes: ( - ) blurriness of vision, ( - ) double vision, ( - ) watery eyes Ears, nose, mouth, throat, and face: ( - ) mucositis, ( - ) sore throat Respiratory: ( - ) cough, ( - ) dyspnea, ( - ) wheezes Cardiovascular: ( - ) palpitation, ( - ) chest discomfort, ( - ) lower extremity swelling Gastrointestinal:  ( - ) nausea, ( - ) heartburn, ( - ) change in bowel habits Skin: ( - ) abnormal skin rashes Lymphatics: ( - ) new lymphadenopathy, ( - ) easy bruising Neurological: ( - ) numbness, ( - ) tingling, ( - ) new weaknesses Behavioral/Psych: ( - ) mood change, ( - ) new changes  All other systems were reviewed with the patient and are negative.  PHYSICAL EXAMINATION: ECOG PERFORMANCE STATUS: 2 - Symptomatic, <50% confined to bed  Vitals:   06/28/21 0842  BP: (!) 97/56  Pulse: (!) 47  Resp: 18  Temp: 97.6 F (36.4 C)  SpO2: 100%   Filed Weights   06/28/21 0842  Weight: 196 lb 4 oz (89 kg)    GENERAL: Chronically ill-appearing elderly African-American female, alert, no distress and comfortable SKIN: skin color, texture, turgor are normal, no rashes or significant lesions EYES: conjunctiva are pink and non-injected, sclera clear LUNGS: clear to auscultation and percussion with normal breathing effort HEART: regular rate & rhythm and no murmurs and no lower extremity edema PSYCH: alert & oriented x 3, fluent speech NEURO: no focal motor/sensory deficits  LABORATORY DATA:  I have reviewed the data as listed CBC Latest Ref Rng & Units 06/19/2021 05/10/2021 04/12/2021  WBC 4.0 - 10.5 K/uL - 4.9 5.4  Hemoglobin 12.0 - 15.0 g/dL 14.3 10.8(L) 10.5(L)  Hematocrit 36.0 - 46.0 %  42.0 34.0(L) 34.2(L)  Platelets 150.0 - 400.0 K/uL - 163.0 160    CMP Latest Ref Rng & Units 06/19/2021 06/19/2021 05/10/2021  Glucose 70 - 99 mg/dL 89 93 99  BUN 8 - 23 mg/dL 48(H) 39(H) 29(H)  Creatinine 0.44 - 1.00 mg/dL 5.90(H) 5.78(H) 5.67(HH)  Sodium 135 - 145 mmol/L 133(L) 135 137  Potassium 3.5 - 5.1 mmol/L 5.4(H) 5.0 4.0  Chloride 98 - 111 mmol/L 96(L) 92(L) 95(L)  CO2 22 - 32 mmol/L - 31 29  Calcium 8.9 - 10.3 mg/dL - 9.2 9.9  Total Protein 6.5 - 8.1 g/dL - 6.5 6.6  Total Bilirubin 0.3 - 1.2 mg/dL - 1.1 0.8  Alkaline Phos 38 - 126 U/L - 215(H) 251(H)  AST 15 - 41 U/L - 52(H) 26  ALT 0 - 44 U/L - 37 19    RADIOGRAPHIC STUDIES: I have personally reviewed the radiological images as listed and agreed with the findings in the report: Numerous lesions of the liver of unclear etiology. MR LIVER W WO CONTRAST  Result Date: 06/20/2021 CLINICAL DATA:  Liver lesions identified on recent ultrasound. EXAM: MRI ABDOMEN WITHOUT AND WITH CONTRAST TECHNIQUE: Multiplanar multisequence MR imaging of the abdomen was performed both before and after the administration of intravenous contrast. CONTRAST:  29m GADAVIST GADOBUTROL 1 MMOL/ML IV SOLN COMPARISON:  CT AP 11/17/2020 FINDINGS: Lower chest: Right pleural effusion. Hepatobiliary: Diffuse increased signal within the liver on the out of phase sequences (compared with the inphase) consistent with iron deposition. Innumerable T2 hyperintense and mildly T1 hyperintense lesions are identified throughout both lobes of liver. The show early arterial phase enhancement which becomes relatively more hypointense on the portal venous phase images. Target like enhancement pattern of these lesions suggest metastatic disease. Index lesions include: -subcapsular lesion within segment 7/6 measuring 2.4 cm, image 20/6. -segment 8 lesion measures 2.7 cm, image 10/6. -posterior segment 2 lesion measures 3.3 cm, image 5/6. Tiny stones noted layering within the gallbladder,  image 19/6. No bile duct dilatation. Pancreas: No mass, inflammatory changes, or other parenchymal abnormality identified. Spleen: There is diffuse low signal on the T2 weighted, inphase images throughout the spleen compatible with susceptibility artifact secondary to iron deposition. No focal splenic lesion identified. Adrenals/Urinary Tract: Normal right adrenal gland. Nodule in the left adrenal gland is again seen measuring 1.7 cm. This exhibits loss of signal on the out of phase sequence compatible with a benign adenoma, image 45/1304. No hydronephrosis identified bilaterally. Bilateral kidney cysts are noted. No solid enhancing kidney lesions identified. Stomach/Bowel: Stomach appears decompressed.  No bowel dilatation. Vascular/Lymphatic: Normal appearance of the abdominal aorta. Portal vein and splenic vein remain patent. No upper abdominal adenopathy. Other: Abdominal ascites is identified. Scratch set small volume of abdominal ascites noted. Musculoskeletal: Multiple enhancing bone lesions are noted worrisome for osseous metastasis. The largest is in the posterior aspect of the L4 vertebral body measuring 2.2 cm, image 92/01/01/2001. diffuse body wall edema compatible with anasarca. IMPRESSION: 1. Innumerable enhancing lesions are identified throughout both lobes of liver worrisome for metastatic disease. 2. Multiple enhancing bone lesions are noted worrisome for osseous metastasis. 3. Ascites. 4. Left adrenal gland adenoma.  Stable 5. 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 6. Right pleural effusion, ascites, and diffuse body wall edema compatible with fluid overload state which may be related to patient's renal failure. Electronically Signed   By: TKerby MoorsM.D.   On: 06/20/2021 11:24    ASSESSMENT & PLAN SCornell Adams 75y.o. female with medical history  significant for duodenal neuroendocrine tumor status post resection who presents for a follow up visit.  At this time there is concern  for numerous lesions in the liver of unclear etiology.  This may be related to the patient's prior neuroendocrine tumor which we supported by the elevations in chromogranin.  At this time I would recommend ordering CT scan of the chest abdomen pelvis in order to assess for other lesions and we have ordered a US guided biopsy of the liver in order to determine if these are neuroendocrine tumor or another possible primary.  The patient voiced understanding of this plan moving forward.  #Duodenal Neuroendocrine Tumor -- Findings are most consistent with a very small neuroendocrine tumor (less than 1 cm on every side) with a Ki-67 of less than 1% -- Prior CT imaging performed in December shows no evidence of metastatic disease. -- Agree with yearly endoscopies for monitoring of recurrence of his neuroendocrine tumor.  #Lesions on the Liver -- Etiology of these is unclear but may be related to the neuroendocrine tumor seen in the duodenum. --Patient does have elevated chromogranin. --Plan for CT scan of the chest abdomen pelvis as well as biopsy of the liver lesions. --RTC following results of the biopsy  Orders Placed This Encounter  Procedures   CT Chest Wo Contrast    Standing Status:   Future    Standing Expiration Date:   06/28/2022    Order Specific Question:   Preferred imaging location?    Answer:   [facility]   CT Abdomen Pelvis Wo Contrast    Standing Status:   Future    Standing Expiration Date:   06/28/2022    Order Specific Question:   Preferred imaging location?    Answer:   [facility]    Order Specific Question:   Is Oral Contrast requested for this exam?    Answer:   Yes, Per Radiology protocol   US BIOPSY (LIVER)    Standing Status:   Future    Standing Expiration Date:   06/28/2022    Order Specific Question:   Lab orders requested (DO NOT place separate lab orders, these will be automatically ordered during procedure specimen collection):    Answer:    Surgical Pathology    Order Specific Question:   Reason for Exam (SYMPTOM  OR DIAGNOSIS REQUIRED)    Answer:   biopsy of liver lesion, unclear primary cancer    Order Specific Question:   Preferred location?    Answer:   [facility]    All questions were answered. The patient knows to call the clinic with any problems, questions or concerns.  A total of more than 30 minutes were spent on this encounter with face-to-face time and non-face-to-face time, including preparing to see the patient, ordering tests and/or medications, counseling the patient and coordination of care as outlined above.   Ledell Peoples, MD Department of Hematology/Oncology [place] [facility] Phone: [phone] Pager: [phone] Email: [email or web address]  07/05/2021 9:30 AM

## 2021-07-06 ENCOUNTER — Other Ambulatory Visit: Payer: Self-pay

## 2021-07-06 ENCOUNTER — Ambulatory Visit (HOSPITAL_COMMUNITY)
Admission: RE | Admit: 2021-07-06 | Discharge: 2021-07-06 | Disposition: A | Discharge status: Home / Self Care | Payer: Medicare Other | Source: Ambulatory Visit | Attending: Hematology and Oncology | Admitting: Hematology and Oncology

## 2021-07-06 ENCOUNTER — Encounter (HOSPITAL_COMMUNITY): Payer: Self-pay

## 2021-07-06 DIAGNOSIS — R160 Hepatomegaly, not elsewhere classified: Secondary | ICD-10-CM | POA: Diagnosis present

## 2021-07-09 ENCOUNTER — Ambulatory Visit (HOSPITAL_COMMUNITY)
Admission: RE | Admit: 2021-07-09 | Discharge: 2021-07-09 | Disposition: A | Discharge status: Home / Self Care | Payer: Medicare Other | Source: Ambulatory Visit | Attending: Hematology and Oncology | Admitting: Hematology and Oncology

## 2021-07-09 ENCOUNTER — Encounter (HOSPITAL_COMMUNITY): Payer: Self-pay

## 2021-07-09 ENCOUNTER — Other Ambulatory Visit: Payer: Self-pay

## 2021-07-09 ENCOUNTER — Emergency Department (HOSPITAL_COMMUNITY): Payer: Medicare Other

## 2021-07-09 ENCOUNTER — Emergency Department (HOSPITAL_COMMUNITY)
Admission: EM | Admit: 2021-07-09 | Discharge: 2021-07-10 | Disposition: A | Discharge status: Home / Self Care | Payer: Medicare Other | Attending: Emergency Medicine | Admitting: Emergency Medicine

## 2021-07-09 ENCOUNTER — Emergency Department (HOSPITAL_COMMUNITY)
Admission: RE | Admit: 2021-07-09 | Discharge: 2021-07-09 | Disposition: A | Discharge status: Home / Self Care | Payer: Medicare Other | Source: Ambulatory Visit | Attending: Hematology and Oncology | Admitting: Hematology and Oncology

## 2021-07-09 DIAGNOSIS — J45909 Unspecified asthma, uncomplicated: Secondary | ICD-10-CM | POA: Diagnosis not present

## 2021-07-09 DIAGNOSIS — Z85068 Personal history of other malignant neoplasm of small intestine: Secondary | ICD-10-CM | POA: Insufficient documentation

## 2021-07-09 DIAGNOSIS — I5023 Acute on chronic systolic (congestive) heart failure: Secondary | ICD-10-CM | POA: Diagnosis not present

## 2021-07-09 DIAGNOSIS — M25551 Pain in right hip: Secondary | ICD-10-CM | POA: Insufficient documentation

## 2021-07-09 DIAGNOSIS — Z87891 Personal history of nicotine dependence: Secondary | ICD-10-CM | POA: Insufficient documentation

## 2021-07-09 DIAGNOSIS — I4891 Unspecified atrial fibrillation: Secondary | ICD-10-CM | POA: Diagnosis not present

## 2021-07-09 DIAGNOSIS — W1839XA Other fall on same level, initial encounter: Secondary | ICD-10-CM | POA: Diagnosis not present

## 2021-07-09 DIAGNOSIS — I132 Hypertensive heart and chronic kidney disease with heart failure and with stage 5 chronic kidney disease, or end stage renal disease: Secondary | ICD-10-CM | POA: Insufficient documentation

## 2021-07-09 DIAGNOSIS — Z992 Dependence on renal dialysis: Secondary | ICD-10-CM | POA: Insufficient documentation

## 2021-07-09 DIAGNOSIS — Y92002 Bathroom of unspecified non-institutional (private) residence single-family (private) house as the place of occurrence of the external cause: Secondary | ICD-10-CM | POA: Insufficient documentation

## 2021-07-09 DIAGNOSIS — Z8616 Personal history of COVID-19: Secondary | ICD-10-CM | POA: Insufficient documentation

## 2021-07-09 DIAGNOSIS — Z7901 Long term (current) use of anticoagulants: Secondary | ICD-10-CM | POA: Insufficient documentation

## 2021-07-09 DIAGNOSIS — M545 Low back pain, unspecified: Secondary | ICD-10-CM | POA: Insufficient documentation

## 2021-07-09 DIAGNOSIS — Z79899 Other long term (current) drug therapy: Secondary | ICD-10-CM | POA: Insufficient documentation

## 2021-07-09 DIAGNOSIS — R160 Hepatomegaly, not elsewhere classified: Secondary | ICD-10-CM

## 2021-07-09 DIAGNOSIS — W19XXXA Unspecified fall, initial encounter: Secondary | ICD-10-CM

## 2021-07-09 DIAGNOSIS — C787 Secondary malignant neoplasm of liver and intrahepatic bile duct: Secondary | ICD-10-CM | POA: Insufficient documentation

## 2021-07-09 DIAGNOSIS — N186 End stage renal disease: Secondary | ICD-10-CM | POA: Diagnosis not present

## 2021-07-09 LAB — COMPREHENSIVE METABOLIC PANEL
ALT: 49 U/L — ABNORMAL HIGH (ref 0–44)
AST: 54 U/L — ABNORMAL HIGH (ref 15–41)
Albumin: 3.7 g/dL (ref 3.5–5.0)
Alkaline Phosphatase: 278 U/L — ABNORMAL HIGH (ref 38–126)
Anion gap: 16 — ABNORMAL HIGH (ref 5–15)
BUN: 63 mg/dL — ABNORMAL HIGH (ref 8–23)
CO2: 28 mmol/L (ref 22–32)
Calcium: 9.4 mg/dL (ref 8.9–10.3)
Chloride: 91 mmol/L — ABNORMAL LOW (ref 98–111)
Creatinine, Ser: 6.57 mg/dL — ABNORMAL HIGH (ref 0.44–1.00)
GFR, Estimated: 6 mL/min — ABNORMAL LOW (ref >60)
Glucose, Bld: 81 mg/dL (ref 70–99)
Potassium: 4.2 mmol/L (ref 3.5–5.1)
Sodium: 135 mmol/L (ref 135–145)
Total Bilirubin: 1.2 mg/dL (ref 0.3–1.2)
Total Protein: 7.1 g/dL (ref 6.5–8.1)

## 2021-07-09 LAB — CBC
HCT: 36.7 % (ref 36.0–46.0)
Hemoglobin: 11.5 g/dL — ABNORMAL LOW (ref 12.0–15.0)
MCH: 30.7 pg (ref 26.0–34.0)
MCHC: 31.3 g/dL (ref 30.0–36.0)
MCV: 97.9 fL (ref 80.0–100.0)
Platelets: 219 10*3/uL (ref 150–400)
RBC: 3.75 MIL/uL — ABNORMAL LOW (ref 3.87–5.11)
RDW: 16.3 % — ABNORMAL HIGH (ref 11.5–15.5)
WBC: 7.2 10*3/uL (ref 4.0–10.5)
nRBC: 0 % (ref 0.0–0.2)

## 2021-07-09 LAB — PROTIME-INR
INR: 1.2 (ref 0.8–1.2)
Prothrombin Time: 15 seconds (ref 11.4–15.2)

## 2021-07-09 MED ORDER — GELATIN ABSORBABLE 12-7 MM EX MISC
CUTANEOUS | Status: AC
  Filled 2021-07-09: qty 1

## 2021-07-09 MED ORDER — SODIUM CHLORIDE 0.9 % IV SOLN: INTRAVENOUS | Status: DC

## 2021-07-09 MED ORDER — LIDOCAINE HCL 1 % IJ SOLN
INTRAMUSCULAR | Status: AC
  Filled 2021-07-09: qty 20

## 2021-07-09 MED ORDER — LIDOCAINE HCL (PF) 1 % IJ SOLN
INTRAMUSCULAR | Status: AC | PRN
  Administered 2021-07-09 (×2): 10 mL via INTRADERMAL

## 2021-07-09 MED ORDER — FENTANYL CITRATE (PF) 100 MCG/2ML IJ SOLN
INTRAMUSCULAR | Status: AC
  Filled 2021-07-09: qty 2

## 2021-07-09 MED ORDER — MIDAZOLAM HCL 2 MG/2ML IJ SOLN
INTRAMUSCULAR | Status: AC | PRN
  Administered 2021-07-09: 0.5 mg via INTRAVENOUS

## 2021-07-09 MED ORDER — MIDAZOLAM HCL 2 MG/2ML IJ SOLN
INTRAMUSCULAR | Status: AC
  Filled 2021-07-09: qty 2

## 2021-07-09 MED ORDER — GELATIN ABSORBABLE 12-7 MM EX MISC
CUTANEOUS | Status: AC | PRN
  Administered 2021-07-09: 1

## 2021-07-09 MED ORDER — FENTANYL CITRATE (PF) 100 MCG/2ML IJ SOLN
INTRAMUSCULAR | Status: AC | PRN
  Administered 2021-07-09: 50 ug via INTRAVENOUS

## 2021-07-09 NOTE — Discharge Instructions (Signed)
Please call Interventional Radiology clinic [phone] with any questions or concerns.   You may remove your dressing and shower tomorrow.   Liver Biopsy, Care After After a liver biopsy, it is common to have these things in the area where the biopsy was done. You may: Have pain. Feel sore. Have bruising. You may also feel tired for a few days. Follow these instructions at home: Medicines Take over-the-counter and prescription medicines only as told by your doctor. If you were prescribed an antibiotic medicine, take it as told by your doctor. Do not stop taking the antibiotic, even if you start to feel better. Do not take medicines that may thin your blood. These medicines include aspirin and ibuprofen. Take them only if your doctor tells you to. If told, take steps to prevent problems with pooping (constipation). You may need to: Drink enough fluid to keep your pee (urine) pale yellow. Take medicines. You will be told what medicines to take. Eat foods that are high in fiber. These include beans, whole grains, and fresh fruits and vegetables. Limit foods that are high in fat and sugar. These include fried or sweet foods. Ask your doctor if you should avoid driving or using machines while you are taking your medicine. Caring for your incision Follow instructions from your doctor about how to take care of your cut from surgery (incisions). Make sure you: Wash your hands with soap and water for at least 20 seconds before and after you change your bandage. If you cannot use soap and water, use hand sanitizer. Change your bandage. Leavestitches or skin glue in place for at least two weeks. Leave tape strips alone unless you are told to take them off. You may trim the edges of the tape strips if they curl up. Check your incision every day for signs of infection. Check for: Redness, swelling, or more pain. Fluid or blood. Warmth. Pus or a bad smell. Do not take baths, swim, or use a  hot tub. Ask your doctor about taking showers or sponge baths. Activity Rest at home for 1-2 days, or as told by your doctor. Get up to take short walks every 1 to 2 hours. Ask for help if you feel weak or unsteady. Do not lift anything that is heavier than 10 lb (4.5 kg), or the limit that you are told. Do not play contact sports for 2 weeks after the procedure. Return to your normal activities as told by your doctor. Ask what activities are safe for you. General instructions  Do not drink alcohol in the first week after the procedure. Plan to have a responsible adult care for you for the time you are told after you leave the hospital or clinic. This is important. It is up to you to get the results of your procedure. Ask how to get your results when they are ready. Keep all follow-up visits.   Contact a doctor if: You have more bleeding in your incision. Your incision swells, or is red and more painful. You have fluid that comes from your incision. You develop a rash. You have fever or chills. Get help right away if: You have swelling, bloating, or pain in your belly (abdomen). You get dizzy or faint. You vomit or you feel like vomiting. You have trouble breathing or feel short of breath. You have chest pain. You have problems talking or seeing. You have trouble with your balance or moving your arms or legs. These symptoms may be an emergency. Get help   right away. Call your local emergency services (911 in the [place] Do not wait to see if the symptoms will go away. Do not drive yourself to the hospital. Summary After the procedure, it is common to have pain, soreness, bruising, and tiredness. Your doctor will tell you how to take care of yourself at home. Change your bandage, take your medicines, and limit your activities as told by your doctor. Call your doctor if you have symptoms of infection. Get help right away if your belly swells, your cut bleeds a lot, or you have trouble  talking or breathing. This information is not intended to replace advice given to you by your health care provider. Make sure you discuss any questions you have with your healthcare provider. Document Revised: 09/25/2020 Document Reviewed: 09/25/2020 [employer] Patient Education  2022 [employer]     Moderate Conscious Sedation, Adult, Care After This sheet gives you information about how to care for yourself after your procedure. Your health care provider may also give you more specific instructions. If you have problems or questions, contact your health care provider. What can I expect after the procedure? After the procedure, it is common to have: Sleepiness for several hours. Impaired judgment for several hours. Difficulty with balance. Vomiting if you eat too soon. Follow these instructions at home: For the time period you were told by your health care provider: Rest. Do not participate in activities where you could fall or become injured. Do not drive or use machinery. Do not drink alcohol. Do not take sleeping pills or medicines that cause drowsiness. Do not make important decisions or sign legal documents. Do not take care of children on your own.        Eating and drinking Follow the diet recommended by your health care provider. Drink enough fluid to keep your urine pale yellow. If you vomit: Drink water, juice, or soup when you can drink without vomiting. Make sure you have little or no nausea before eating solid foods.    General instructions Take over-the-counter and prescription medicines only as told by your health care provider. Have a responsible adult stay with you for the time you are told. It is important to have someone help care for you until you are awake and alert. Do not smoke. Keep all follow-up visits as told by your health care provider. This is important. Contact a health care provider if: You are still sleepy or having trouble with balance after  24 hours. You feel light-headed. You keep feeling nauseous or you keep vomiting. You develop a rash. You have a fever. You have redness or swelling around the IV site. Get help right away if: You have trouble breathing. You have new-onset confusion at home. Summary After the procedure, it is common to feel sleepy, have impaired judgment, or feel nauseous if you eat too soon. Rest after you get home. Know the things you should not do after the procedure. Follow the diet recommended by your health care provider and drink enough fluid to keep your urine pale yellow. Get help right away if you have trouble breathing or new-onset confusion at home. This information is not intended to replace advice given to you by your health care provider. Make sure you discuss any questions you have with your health care provider. Document Revised: 03/10/2020 Document Reviewed: 10/07/2019 [employer] Patient Education  2021 [employer]  

## 2021-07-09 NOTE — ED Triage Notes (Signed)
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 Having pain in BLE and lower back. Also reports numbness in RLE since the fall on Saturday which has caused her to be unable to stand.

## 2021-07-09 NOTE — Procedures (Signed)
Pre Procedure Dx: Liver lesions Post Procedural Dx: Same  Technically successful US guided biopsy of indeterminate lesion within the central aspect of the right lobe of the liver.   EBL: Minimal No immediate complications.   Ronny Bacon, MD Pager #: [phone]

## 2021-07-09 NOTE — H&P (Addendum)
Referring Physician(s): Dorsey,John T IV  Supervising Physician: Sandi Mariscal  Patient Status:  [facility] OP  Chief Complaint: "I'm having a liver biopsy"   Subjective: Patient familiar to IR service from right thoracenteses x4 in 2021. She  has a history of duodenal neuroendocrine tumor in 2021, status post resection.  Recent follow-up imaging has revealed:  Possible 2.6 cm right lateral breast lesion, raising concern for primary breast neoplasm. Overlying skin thickening. Associated right axillary lymphadenopathy. Mammographic correlation is suggested.   Small bilateral pulmonary nodules measuring up to 5 mm, new from July 2021, raising concern for metastases. Small to moderate right pleural effusion, partially loculated.   Multifocal hepatic metastases throughout both lobes, new from December 2021.   Mild to moderate pathologic compression fracture at L4. No retropulsion.  She is scheduled today for image guided liver mass biopsy/possible paracentesis for further evaluation.  She currently denies fever, headache, chest pain, worsening dyspnea, cough, nausea, vomiting or bleeding.  She does have intermittent abdominal and back pain along with sciatica.  Additional medical history as below.   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   Cardiomyopathy ([place])    CHF (congestive heart failure) ([place])    Chronic kidney disease    Edema    History of hemodialysis    Hyperlipidemia    Hypertension    Hypotension    on midodrine (as of 06/15/21)   Pulmonary hypertension ([facility])    Recurrent right pleural effusion    As of 06/15/21: s/p thoracenteses 05/05/20, 06/11/20, 08/17/20, 11/18/20, 04/06/21   Sleep apnea    Tricuspid regurgitation    Past Surgical History:  Procedure Laterality Date   A/V FISTULAGRAM Left 05/23/2020   Procedure: A/V FISTULAGRAM;  Surgeon: [name], MD;  Location:  [place] [facility];  Service: Cardiovascular;  Laterality: Left;   A/V FISTULAGRAM N/A 12/21/2020   Procedure: A/V FISTULAGRAM - Left Upper Arm;  Surgeon: [name], MD;  Location: [place] CV [facility];  Service: Cardiovascular;  Laterality: N/A;   ABDOMINAL HYSTERECTOMY     AV FISTULA PLACEMENT Left 11/04/2019   Procedure: Insertion Of Arteriovenous (Av) Gore-Tex Graft Arm;  Surgeon: [name], MD;  Location: [place];  Service: Vascular;  Laterality: Left;   BIOPSY  11/14/2020   Procedure: BIOPSY;  Surgeon: Yetta Flock, MD;  Location: [place];  Service: Gastroenterology;;   BIOPSY  11/16/2020   Procedure: BIOPSY;  Surgeon: Yetta Flock, MD;  Location: [place];  Service: Gastroenterology;;   BIOPSY  02/22/2021   Procedure: BIOPSY;  Surgeon: Irving Copas., MD;  Location: [facility] ENDOSCOPY;  Service: Gastroenterology;;   CARDIAC CATHETERIZATION     CARDIOVERSION N/A 10/26/2019   Procedure: CARDIOVERSION;  Surgeon: [name], MD;  Location: [facility] ENDOSCOPY;  Service: Cardiovascular;  Laterality: N/A;   COLONOSCOPY W/ BIOPSIES AND POLYPECTOMY     COLONOSCOPY WITH PROPOFOL N/A 11/16/2020   Procedure: COLONOSCOPY WITH PROPOFOL;  Surgeon: Yetta Flock, MD;  Location: [place];  Service: Gastroenterology;  Laterality: N/A;   ENDOSCOPIC MUCOSAL RESECTION N/A 02/22/2021   Procedure: ENDOSCOPIC MUCOSAL RESECTION;  Surgeon: Rush Landmark Telford Nab., MD;  Location: [place];  Service: Gastroenterology;  Laterality: N/A;   ESOPHAGOGASTRODUODENOSCOPY (EGD) WITH PROPOFOL Left 11/14/2020   Procedure: ESOPHAGOGASTRODUODENOSCOPY (EGD) WITH PROPOFOL;  Surgeon: Yetta Flock, MD;  Location: [place] ENDOSCOPY;  Service: Gastroenterology;  Laterality: Left;   ESOPHAGOGASTRODUODENOSCOPY (EGD) WITH PROPOFOL N/A 02/22/2021   Procedure: ESOPHAGOGASTRODUODENOSCOPY (EGD) WITH PROPOFOL;  Surgeon: Rush Landmark Telford Nab., MD;  Location: [place];   Service: Gastroenterology;  Laterality: N/A;   FISTULA SUPERFICIALIZATION Left 01/21/2020   Procedure: FISTULA SUPERFICIALIZATION OF FISTULA;  Surgeon: [name], MD;  Location: [place];  Service: Vascular;  Laterality: Left;   FISTULA SUPERFICIALIZATION Left 07/02/8675   Procedure: PLICATION OF LEFT UPPER EXTREMITY ARTERIOVENOUS FISTULA;  Surgeon: Waynetta [name], MD;  Location: [place];  Service: Vascular;  Laterality: Left;   GIVENS CAPSULE STUDY N/A 11/16/2020   Procedure: GIVENS CAPSULE STUDY;  Surgeon: Yetta Flock, MD;  Location: [place];  Service: Gastroenterology;  Laterality: N/A;   HEMOSTASIS CLIP PLACEMENT  02/22/2021   Procedure: HEMOSTASIS CLIP PLACEMENT;  Surgeon: Rush Landmark Telford Nab., MD;  Location: [place];  Service: Gastroenterology;;   INSERTION OF DIALYSIS CATHETER Right 11/04/2019   Procedure: CONVERT TEMPORARY DIALYSIS CATHETER TO TUNNELED DIALYSIS CATHETER Right Internal Jugular.;  Surgeon: [name], MD;  Location: [place];  Service: Vascular;  Laterality: Right;   IR THORACENTESIS ASP PLEURAL SPACE W/IMG GUIDE  08/17/2020   MULTIPLE TOOTH EXTRACTIONS     POLYPECTOMY  11/16/2020   Procedure: POLYPECTOMY;  Surgeon: Yetta Flock, MD;  Location: [place];  Service: Gastroenterology;;   RADIOLOGY WITH ANESTHESIA N/A 06/19/2021   Procedure: MRI WITH ANESTHESIA  ,LIVER WITH AND WITHOUT CONTRAST;  Surgeon: Radiologist, Medication, MD;  Location: [place];  Service: Radiology;  Laterality: N/A;   REVISION OF ARTERIOVENOUS GORETEX GRAFT Left 01/21/2020   Procedure: REVISION OF ARTERIOVENOUS FISTULA WITH SIDE BRANCH LIGATION;  Surgeon: [name], MD;  Location: [place];  Service: Vascular;  Laterality: Left;   SUBMUCOSAL LIFTING INJECTION  02/22/2021   Procedure: SUBMUCOSAL LIFTING INJECTION;  Surgeon: Irving Copas., MD;  Location: [place];  Service: Gastroenterology;;   SUBMUCOSAL TATTOO INJECTION  02/22/2021    Procedure: SUBMUCOSAL TATTOO INJECTION;  Surgeon: Irving Copas., MD;  Location: [place];  Service: Gastroenterology;;   TEE WITHOUT CARDIOVERSION N/A 10/26/2019   Procedure: TRANSESOPHAGEAL ECHOCARDIOGRAM (TEE);  Surgeon: [name], MD;  Location: [facility] ENDOSCOPY;  Service: Cardiovascular;  Laterality: N/A;   THORACENTESIS     x 2   THORACENTESIS N/A 11/07/2020   Procedure: Mathews Robinsons;  Surgeon: [name], MD;  Location[facility] ENDOSCOPY;  Service: Pulmonary;  Laterality: N/A;   THORACENTESIS N/A 04/06/2021   Procedure: THORACENTESIS;  Surgeon: Freddi Starr, MD;  Location: [facility] ENDOSCOPY;  Service: Pulmonary;  Laterality: N/A;   UPPER ESOPHAGEAL ENDOSCOPIC ULTRASOUND (EUS) N/A 02/22/2021   Procedure: UPPER ESOPHAGEAL ENDOSCOPIC ULTRASOUND (EUS);  Surgeon: Irving Copas., MD;  Location: [place];  Service: Gastroenterology;  Laterality: N/A;     Allergies: Black walnut flavor and Shellfish allergy  Medications: Prior to Admission medications   Medication Sig Start Date End Date Taking? Authorizing Provider  acetaminophen (TYLENOL) 325 MG tablet Take 2 tablets (650 mg total) by mouth every 4 (four) hours as needed for headache or mild pain. 11/05/19  Yes Buriev, Arie Sabina, MD  allopurinol (ZYLOPRIM) 300 MG tablet TAKE 1 TABLET BY MOUTH EVERY NIGHT AT BEDTIME Patient taking differently: Take 300 mg by mouth at bedtime. 03/23/21  Yes [name], NP  atorvastatin (LIPITOR) 80 MG tablet TAKE 1 TABLET (80 MG TOTAL) BY MOUTH AT BEDTIME. 02/26/21  Yes [name], MD  B Complex-C-Zn-Folic Acid (DIALYVITE 720-NOBS 15) 0.8 MG TABS  Take 1 tablet by mouth daily. 01/10/21  Yes [provider]  cetirizine (ZYRTEC) 10 MG tablet Take 10 mg by mouth daily as needed for allergies.   Yes [provider]  cinacalcet (SENSIPAR) 30 MG tablet Take 30 mg by mouth daily with supper.  04/18/20  Yes [provider]  ELIQUIS 5 MG TABS  tablet TAKE 1 TABLET (5 MG TOTAL) BY MOUTH 2 (TWO) TIMES DAILY. 04/20/21  Yes [name], MD  esomeprazole (NEXIUM) 40 MG capsule Take 1 capsule (40 mg total) by mouth 2 (two) times daily before a meal. 02/22/21 02/22/22 Yes Mansouraty, Telford Nab., MD  ferric citrate (AURYXIA) 1 GM 210 MG(Fe) tablet Take 210 mg by mouth 3 (three) times daily with meals.   Yes [provider]  furosemide (LASIX) 40 MG tablet Take 80 mg by mouth See admin instructions. Take 2 tablets (80 mg) by mouth twice daily on non-dialysis days - Sunday, Tuesday, Thursday   Yes [provider]  gabapentin (NEURONTIN) 100 MG capsule Take 1 capsule (100 mg total) by mouth 3 (three) times daily. 04/24/21  Yes [name], NP  lidocaine-prilocaine (EMLA) cream Apply 1 application topically See admin instructions. Apply topically to port access one hour prior to dialysis - Monday, Wednesday, Friday, Saturday 04/07/20  Yes [provider]  metoprolol succinate (TOPROL-XL) 25 MG 24 hr tablet TAKE 1 TABLET BY MOUTH EVERY MORNING & TAKE 1 TABLET EVERY NIGHT AT BEDTIME WITH OR IMMEDIATELY FOLLOWING MEAL Patient taking differently: Take 25 mg by mouth in the morning and at bedtime. 03/27/21  Yes [name], MD  midodrine (PROAMATINE) 10 MG tablet Take 1 tablet (10 mg total) by mouth daily. 11/20/20  Yes Pahwani, Rinka R, MD  montelukast (SINGULAIR) 10 MG tablet Take 1 tablet by mouth once daily Patient taking differently: Take 10 mg by mouth daily as needed (allergies). 11/28/20  Yes [name], NP  Multiple Vitamins-Minerals (MULTIVITAMIN WITH MINERALS) tablet Take 1 tablet by mouth daily.   Yes [provider]  Nutritional Supplements (,FEEDING SUPPLEMENT, PROSOURCE PLUS) liquid Take 30 mLs by mouth 2 (two) times daily between meals. 11/19/20  Yes Pahwani, Rinka R, MD  triamcinolone 0.1% oint-Eucerin equivalent cream 1:1 mixture Apply topically 3 (three) times daily as needed. APPLY TO  DRY AFFECTED AREAS AS NEEDED 3 TIMES ADAY 04/24/21  Yes [name], NP  vitamin B-12 (CYANOCOBALAMIN) 1000 MCG tablet Take 1,000 mcg by mouth daily.   Yes [provider]  vitamin C (VITAMIN C) 500 MG tablet Take 1 tablet (500 mg total) by mouth daily. 11/06/19  Yes Buriev, Arie Sabina, MD  albuterol (VENTOLIN HFA) 108 (90 Base) MCG/ACT inhaler Inhale 2 puffs into the lungs every 6 (six) hours as needed for wheezing or shortness of breath. 11/30/19   [name], NP     Vital Signs: BP 138/77   Pulse 94   Temp 97.8 F (36.6 C) (Oral)   Resp 18   Ht 5\' 3"  (1.6 m)   Wt 200 lb (90.7 kg)   SpO2 99%   BMI 35.43 kg/m   Physical Exam awake, alert.  Chest with diminished breath sounds right base, left clear.  Heart with irregularly irregular rhythm.  Abdomen soft, positive bowel sounds, some tenderness right upper/lateral regions, no lower extremity edema.  Left arm AV fistula.  Imaging: CT Abdomen Pelvis Wo Contrast  Result Date: 07/08/2021 CLINICAL DATA:  Duodenal neuroendocrine tumor resection, numerous liver masses with unknown  primary EXAM: CT CHEST, ABDOMEN AND PELVIS WITHOUT CONTRAST TECHNIQUE: Multidetector CT imaging of the chest, abdomen and pelvis was performed following the standard protocol without IV contrast. COMPARISON:  MRI abdomen dated 06/19/2021. CT abdomen/pelvis dated 11/17/2020. CT chest dated 06/10/2020. FINDINGS: Limited evaluation due to lack of intravenous contrast administration. CT CHEST FINDINGS Cardiovascular: Cardiomegaly.  No pericardial effusion. No evidence of thoracic aortic aneurysm. Atherosclerotic calcifications of the aortic arch. Mediastinum/Nodes: No suspicious mediastinal lymphadenopathy. Multiple right axillary nodes measuring up to 12 mm short axis (series 2/image 20). Lungs/Pleura: Small to moderate right pleural effusion, partially loculated. Associated right middle and right lower lobe compressive atelectasis. Multiple scattered  small bilateral pulmonary nodules measuring 3-5 mm (for example, series 4/images 30, 34, 56, 62, 63, 74, 76, and 118). These are new from July 2021. No pneumothorax. Musculoskeletal: Asymmetric glandular tissue with a possible underlying 2.6 x 2.3 cm lesion in the right lateral breast (series 2/image 34). Overlying skin thickening. Degenerative changes of the thoracic spine. CT ABDOMEN PELVIS FINDINGS Hepatobiliary: Multifocal hepatic masses throughout both lobes, poorly evaluated on unenhanced CT. Index lesion measures 3.5 cm in the posterior aspect of segment 2 (series 2/image 54). Additional index lesion measures 2.4 cm in segment 6 (series 2/image 71). These are new from December 2021, suggesting metastases. Layering gallstones (series 2/image 71), without associated inflammatory changes. No intrahepatic or extrahepatic duct dilatation. Pancreas: Within normal limits. Spleen: Within normal limits. Adrenals/Urinary Tract: 19 mm left adrenal adenoma (series 2/image 65). Right adrenal gland is within normal limits. 16 mm left lower pole renal cyst (series 2/image 37). Three nonobstructing left renal calculi measuring up to 5 mm in the lower pole (coronal image 93). Two nonobstructing right lower pole renal calculi measuring up to 3 mm (coronal image 90). No hydronephrosis. Bladder is underdistended and poorly evaluated. Stomach/Bowel: Stomach is within normal limits. No evidence of bowel obstruction. Normal appendix (series 2/image 92). No colonic wall thickening or mass is evident on CT. Vascular/Lymphatic: No evidence of abdominal aortic aneurysm. Atherosclerotic calcifications of the abdominal aorta and branch vessels. No suspicious abdominopelvic lymphadenopathy. Reproductive: Status post hysterectomy. No adnexal masses. Other: Small volume pelvic ascites. Musculoskeletal: Degenerative changes of the lumbar spine. Mild to moderate loss of height involving the L4 vertebral body with mild sclerosis in suspected  lytic metastasis along the posterior aspect (sagittal image 108), suggesting pathologic fracture. No retropulsion. IMPRESSION: Limited evaluation due to lack of intravenous contrast administration. Possible 2.6 cm right lateral breast lesion, raising concern for primary breast neoplasm. Overlying skin thickening. Associated right axillary lymphadenopathy. Mammographic correlation is suggested. Small bilateral pulmonary nodules measuring up to 5 mm, new from July 2021, raising concern for metastases. Small to moderate right pleural effusion, partially loculated. Multifocal hepatic metastases throughout both lobes, new from December 2021. Mild to moderate pathologic compression fracture at L4. No retropulsion. Additional ancillary findings as above. Electronically Signed   By: Julian Hy M.D.   On: 07/08/2021 19:50   CT Chest Wo Contrast  Result Date: 07/08/2021 CLINICAL DATA:  Duodenal neuroendocrine tumor resection, numerous liver masses with unknown primary EXAM: CT CHEST, ABDOMEN AND PELVIS WITHOUT CONTRAST TECHNIQUE: Multidetector CT imaging of the chest, abdomen and pelvis was performed following the standard protocol without IV contrast. COMPARISON:  MRI abdomen dated 06/19/2021. CT abdomen/pelvis dated 11/17/2020. CT chest dated 06/10/2020. FINDINGS: Limited evaluation due to lack of intravenous contrast administration. CT CHEST FINDINGS Cardiovascular: Cardiomegaly.  No pericardial effusion. No evidence of thoracic aortic aneurysm. Atherosclerotic calcifications of the aortic  arch. Mediastinum/Nodes: No suspicious mediastinal lymphadenopathy. Multiple right axillary nodes measuring up to 12 mm short axis (series 2/image 20). Lungs/Pleura: Small to moderate right pleural effusion, partially loculated. Associated right middle and right lower lobe compressive atelectasis. Multiple scattered small bilateral pulmonary nodules measuring 3-5 mm (for example, series 4/images 30, 34, 56, 62, 63, 74, 76, and  118). These are new from July 2021. No pneumothorax. Musculoskeletal: Asymmetric glandular tissue with a possible underlying 2.6 x 2.3 cm lesion in the right lateral breast (series 2/image 34). Overlying skin thickening. Degenerative changes of the thoracic spine. CT ABDOMEN PELVIS FINDINGS Hepatobiliary: Multifocal hepatic masses throughout both lobes, poorly evaluated on unenhanced CT. Index lesion measures 3.5 cm in the posterior aspect of segment 2 (series 2/image 54). Additional index lesion measures 2.4 cm in segment 6 (series 2/image 71). These are new from December 2021, suggesting metastases. Layering gallstones (series 2/image 71), without associated inflammatory changes. No intrahepatic or extrahepatic duct dilatation. Pancreas: Within normal limits. Spleen: Within normal limits. Adrenals/Urinary Tract: 19 mm left adrenal adenoma (series 2/image 65). Right adrenal gland is within normal limits. 16 mm left lower pole renal cyst (series 2/image 37). Three nonobstructing left renal calculi measuring up to 5 mm in the lower pole (coronal image 93). Two nonobstructing right lower pole renal calculi measuring up to 3 mm (coronal image 90). No hydronephrosis. Bladder is underdistended and poorly evaluated. Stomach/Bowel: Stomach is within normal limits. No evidence of bowel obstruction. Normal appendix (series 2/image 92). No colonic wall thickening or mass is evident on CT. Vascular/Lymphatic: No evidence of abdominal aortic aneurysm. Atherosclerotic calcifications of the abdominal aorta and branch vessels. No suspicious abdominopelvic lymphadenopathy. Reproductive: Status post hysterectomy. No adnexal masses. Other: Small volume pelvic ascites. Musculoskeletal: Degenerative changes of the lumbar spine. Mild to moderate loss of height involving the L4 vertebral body with mild sclerosis in suspected lytic metastasis along the posterior aspect (sagittal image 108), suggesting pathologic fracture. No retropulsion.  IMPRESSION: Limited evaluation due to lack of intravenous contrast administration. Possible 2.6 cm right lateral breast lesion, raising concern for primary breast neoplasm. Overlying skin thickening. Associated right axillary lymphadenopathy. Mammographic correlation is suggested. Small bilateral pulmonary nodules measuring up to 5 mm, new from July 2021, raising concern for metastases. Small to moderate right pleural effusion, partially loculated. Multifocal hepatic metastases throughout both lobes, new from December 2021. Mild to moderate pathologic compression fracture at L4. No retropulsion. Additional ancillary findings as above. Electronically Signed   By: Julian Hy M.D.   On: 07/08/2021 19:50    Labs:  CBC: Recent Labs    12/14/20 1525 12/21/20 0604 04/12/21 1017 05/10/21 1501 06/19/21 0720 07/09/21 1130  WBC 5.3  --  5.4 4.9  --  7.2  HGB 10.2*   < > 10.5* 10.8* 14.3 11.5*  HCT 34.4*   < > 34.2* 34.0* 42.0 36.7  PLT 155  --  160 163.0  --  219   < > = values in this interval not displayed.    COAGS: Recent Labs    08/31/20 1637 11/14/20 0015 05/10/21 1501 07/09/21 1130  INR 1.8* 1.4* CANCELED 1.2    BMP: Recent Labs    11/19/20 0306 12/21/20 0604 04/12/21 1017 05/10/21 1501 06/19/21 0547 06/19/21 0720 07/09/21 1130  NA 133*   < > 141 137 135 133* 135  K 4.2   < > 3.9 4.0 5.0 5.4* 4.2  CL 97*   < > 97* 95* 92* 96* 91*  CO2 24  --  30 29 31   --  28  GLUCOSE 121*   < > 90 99 93 89 81  BUN 22   < > 26* 29* 39* 48* 63*  CALCIUM 7.2*  --  9.8 9.9 9.2  --  9.4  CREATININE 5.12*   < > 5.57* 5.67* 5.78* 5.90* 6.57*  GFRNONAA 8*  --  8*  --  7*  --  6*   < > = values in this interval not displayed.    LIVER FUNCTION TESTS: Recent Labs    04/12/21 1017 05/10/21 1501 06/19/21 0547 07/09/21 1130  BILITOT 0.9 0.8 1.1 1.2  AST 38 26 52* 54*  ALT 26 19 37 49*  ALKPHOS 294* 251* 215* 278*  PROT 6.8 6.6 6.5 7.1  ALBUMIN 3.1* 3.9 3.4* 3.7    Assessment  and Plan: Patient familiar to IR service from right thoracenteses x4 in 2021. She  has a history of duodenal neuroendocrine tumor in 2021, status post resection.  Recent follow-up imaging has revealed:  Possible 2.6 cm right lateral breast lesion, raising concern for primary breast neoplasm. Overlying skin thickening. Associated right axillary lymphadenopathy. Mammographic correlation is suggested.   Small bilateral pulmonary nodules measuring up to 5 mm, new from July 2021, raising concern for metastases. Small to moderate right pleural effusion, partially loculated.   Multifocal hepatic metastases throughout both lobes, new from December 2021.   Mild to moderate pathologic compression fracture at L4. No retropulsion.  She is scheduled today for image guided liver mass biopsy/possible paracentesis for further evaluation.Risks and benefits of procedure was discussed with the patient  including, but not limited to bleeding, infection, damage to adjacent structures or low yield requiring additional tests.  All of the questions were answered and there is agreement to proceed.  Consent signed and in chart.    Electronically Signed: D. Rowe Robert, PA-C 07/09/2021, 12:46 PM   I spent a total of 25 minutes at the the patient's bedside AND on the patient's hospital floor or unit, greater than 50% of which was counseling/coordinating care for image guided liver mass biopsy

## 2021-07-09 NOTE — ED Notes (Signed)
Patient transported to CT 

## 2021-07-09 NOTE — ED Provider Notes (Signed)
Emergency Medicine Provider Triage Evaluation Note  Melissa Adams , a 75 y.o. female  was evaluated in triage.  Pt complains of falls. She has has had two mechanical falls in the last week. She is c/o lower back pain and bilat hip pain. States she cannot put weight on her right leg and has some tingling. Denies head trauma from any of the falls.  Review of Systems  Positive: Back pain, hip pain Negative: Head trauma, loc  Physical Exam  BP 124/79 (BP Location: Right Arm)   Pulse (!) 56   Temp 98.2 F (36.8 C) (Oral)   Resp 18   SpO2 91%  Gen:   Awake, no distress   Resp:  Normal effort  MSK:   Moves extremities without difficulty  Other:  Ttp to the lumbar spine, decreased sensation to the rle  Medical Decision Making  Medically screening exam initiated at 5:53 PM.  Appropriate orders placed.  Melissa Adams was informed that the remainder of the evaluation will be completed by another provider, this initial triage assessment does not replace that evaluation, and the importance of remaining in the ED until their evaluation is complete.     Melissa Booze, PA-C 07/09/21 [place], [place], DO 07/09/21 1947

## 2021-07-09 NOTE — ED Notes (Signed)
Have pt turn on her cell phone and call her daughter please

## 2021-07-09 NOTE — ED Provider Notes (Signed)
I provided a substantive portion of the care of this patient.  I personally performed the entirety of the medical decision making for this encounter.  75 year old female here for mechanical fall just prior to arrival.  Has pain to her right hip as well as her lumbar spine.  Has compression fracture at L4.  Image her right hip and placing a lumbar brace   Lacretia Leigh, MD 07/09/21 2328

## 2021-07-09 NOTE — ED Provider Notes (Signed)
[facility] EMERGENCY DEPARTMENT Provider Note   CSN: [id number] Arrival date & time: 07/09/21  1726     History Chief Complaint  Patient presents with   Fall   Back Pain   Leg Pain    Melissa Adams is a 75 y.o. female.  Ms. Melissa Adams is a 75 y/o female with PMH of afib, CHF, and CKD on dialysis who presents with back pain and right sided hip pain. She reports falling 3 nights ago while in the bathroom and fell on her bottom, denies head trauma. Since that fall she has had trouble with putting any weight on her right leg and feels as though her leg is going to give out.  She normally uses a cane but has been using walker since her fall. She lives with her daughter who is her primary caregiver.   Patient was originally seen at urgent care in July for lower back pain and was given prednisone for likely sciatica. She believes the prednisone helped initially but she has still had some back pain and numbness that goes down her right leg. She has never had spine imaging.  The history is provided by the patient and a relative.  Fall This is a new problem. The current episode started more than 2 days ago. Pertinent negatives include no chest pain, no abdominal pain and no shortness of breath.  Back Pain Location:  Lumbar spine and sacro-iliac joint Quality:  Shooting Radiates to:  R posterior upper leg Pain severity:  Moderate Duration:  1 month Timing:  Constant Progression:  Worsening (Worsened by recent fall) Context: recent injury   Associated symptoms: leg pain   Associated symptoms: no abdominal pain, no chest pain, no fever and no weakness   Leg Pain Pain details:    Quality:  Tingling   Radiates to:  R leg   Timing:  Intermittent Associated symptoms: back pain and muscle weakness   Associated symptoms: no fever       Past Medical History:  Diagnosis Date   A-fib ([place])    Allergies    Arthritis    Asthma    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   Cardiomyopathy ([place])    CHF (congestive heart failure) ([place])    Chronic kidney disease    Edema    History of hemodialysis    Hyperlipidemia    Hypertension    Hypotension    on midodrine (as of 06/15/21)   Pulmonary hypertension ([place])    Recurrent right pleural effusion    As of 06/15/21: s/p thoracenteses 05/05/20, 06/11/20, 08/17/20, 11/18/20, 04/06/21   Sleep apnea    Tricuspid regurgitation     Patient Active Problem List   Diagnosis Date Noted   Abnormal findings on esophagogastroduodenoscopy (EGD) 01/27/2021   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   Benign carcinoid tumor of duodenum    Benign neoplasm of colon    Cirrhosis of liver with ascites ([place])    Melena    GI bleed 11/13/2020   Upper GI bleeding 62/70/3500   Chronic systolic CHF (congestive heart failure) ([place]) 11/13/2020   Acute blood loss anemia 11/13/2020   Chronic anticoagulation 11/13/2020   Pleural effusion 06/11/2020   Aortic atherosclerosis ([place]) 06/11/2020   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/11/2020   Acute on chronic systolic heart failure ([place])    Pressure injury of skin 10/22/2019   Dyspnea    COVID-19 virus infection    CHF exacerbation ([place]) 10/12/2019  Acute CHF (congestive heart failure) ([place]) 10/11/2019   Atrial fibrillation, chronic ([place]) 10/11/2019   Essential hypertension 10/11/2019   Sleep apnea 10/11/2019   AKI (acute kidney injury) ([place]) 10/11/2019   Gout 10/11/2019   Anemia 10/11/2019    Past Surgical History:  Procedure Laterality Date   A/V FISTULAGRAM Left 05/23/2020   Procedure: A/V FISTULAGRAM;  Surgeon: [name], MD;  Location: [place] [facility];  Service: Cardiovascular;  Laterality: Left;   A/V FISTULAGRAM N/A 12/21/2020   Procedure: A/V FISTULAGRAM - Left Upper Arm;  Surgeon: [name], MD;  Location: [place] [facility];  Service: Cardiovascular;  Laterality: N/A;   ABDOMINAL HYSTERECTOMY     AV FISTULA PLACEMENT Left 11/04/2019    Procedure: Insertion Of Arteriovenous (Av) Gore-Tex Graft Arm;  Surgeon: [name], MD;  Location: [place];  Service: Vascular;  Laterality: Left;   BIOPSY  11/14/2020   Procedure: BIOPSY;  Surgeon: Yetta Flock, MD;  Location: [place];  Service: Gastroenterology;;   BIOPSY  11/16/2020   Procedure: BIOPSY;  Surgeon: Yetta Flock, MD;  Location: [place];  Service: Gastroenterology;;   BIOPSY  02/22/2021   Procedure: BIOPSY;  Surgeon: Irving Copas., MD;  Location: [facility] ENDOSCOPY;  Service: Gastroenterology;;   CARDIAC CATHETERIZATION     CARDIOVERSION N/A 10/26/2019   Procedure: CARDIOVERSION;  Surgeon: [name], MD;  Location: [facility] ENDOSCOPY;  Service: Cardiovascular;  Laterality: N/A;   COLONOSCOPY W/ BIOPSIES AND POLYPECTOMY     COLONOSCOPY WITH PROPOFOL N/A 11/16/2020   Procedure: COLONOSCOPY WITH PROPOFOL;  Surgeon: Yetta Flock, MD;  Location: [place];  Service: Gastroenterology;  Laterality: N/A;   ENDOSCOPIC MUCOSAL RESECTION N/A 02/22/2021   Procedure: ENDOSCOPIC MUCOSAL RESECTION;  Surgeon: Rush Landmark Telford Nab., MD;  Location: [place];  Service: Gastroenterology;  Laterality: N/A;   ESOPHAGOGASTRODUODENOSCOPY (EGD) WITH PROPOFOL Left 11/14/2020   Procedure: ESOPHAGOGASTRODUODENOSCOPY (EGD) WITH PROPOFOL;  Surgeon: Yetta Flock, MD;  Location: [place];  Service: Gastroenterology;  Laterality: Left;   ESOPHAGOGASTRODUODENOSCOPY (EGD) WITH PROPOFOL N/A 02/22/2021   Procedure: ESOPHAGOGASTRODUODENOSCOPY (EGD) WITH PROPOFOL;  Surgeon: Rush Landmark Telford Nab., MD;  Location: [place];  Service: Gastroenterology;  Laterality: N/A;   FISTULA SUPERFICIALIZATION Left 01/21/2020   Procedure: FISTULA SUPERFICIALIZATION OF FISTULA;  Surgeon: [name], MD;  Location: [place];  Service: Vascular;  Laterality: Left;   FISTULA SUPERFICIALIZATION Left 06/01/5884   Procedure: PLICATION OF LEFT UPPER EXTREMITY  ARTERIOVENOUS FISTULA;  Surgeon: Waynetta [name], MD;  Location: [place];  Service: Vascular;  Laterality: Left;   GIVENS CAPSULE STUDY N/A 11/16/2020   Procedure: GIVENS CAPSULE STUDY;  Surgeon: Yetta Flock, MD;  Location: [place];  Service: Gastroenterology;  Laterality: N/A;   HEMOSTASIS CLIP PLACEMENT  02/22/2021   Procedure: HEMOSTASIS CLIP PLACEMENT;  Surgeon: Rush Landmark Telford Nab., MD;  Location: [place];  Service: Gastroenterology;;   INSERTION OF DIALYSIS CATHETER Right 11/04/2019   Procedure: CONVERT TEMPORARY DIALYSIS CATHETER TO TUNNELED DIALYSIS CATHETER Right Internal Jugular.;  Surgeon: [name], MD;  Location: [place];  Service: Vascular;  Laterality: Right;   IR THORACENTESIS ASP PLEURAL SPACE W/IMG GUIDE  08/17/2020   MULTIPLE TOOTH EXTRACTIONS     POLYPECTOMY  11/16/2020   Procedure: POLYPECTOMY;  Surgeon: Yetta Flock, MD;  Location: [facility] ENDOSCOPY;  Service: Gastroenterology;;   RADIOLOGY WITH ANESTHESIA N/A 06/19/2021   Procedure: MRI WITH ANESTHESIA  ,LIVER WITH AND WITHOUT CONTRAST;  Surgeon: Radiologist, Medication, MD;  Location: [place]  OR;  Service: Radiology;  Laterality: N/A;   REVISION OF ARTERIOVENOUS GORETEX GRAFT Left 01/21/2020   Procedure: REVISION OF ARTERIOVENOUS FISTULA WITH SIDE BRANCH LIGATION;  Surgeon: [name], MD;  Location: [place];  Service: Vascular;  Laterality: Left;   SUBMUCOSAL LIFTING INJECTION  02/22/2021   Procedure: SUBMUCOSAL LIFTING INJECTION;  Surgeon: Irving Copas., MD;  Location: [place];  Service: Gastroenterology;;   SUBMUCOSAL TATTOO INJECTION  02/22/2021   Procedure: SUBMUCOSAL TATTOO INJECTION;  Surgeon: Irving Copas., MD;  Location: [place];  Service: Gastroenterology;;   TEE WITHOUT CARDIOVERSION N/A 10/26/2019   Procedure: TRANSESOPHAGEAL ECHOCARDIOGRAM (TEE);  Surgeon: [name], MD;  Location: [facility] ENDOSCOPY;  Service: Cardiovascular;  Laterality:  N/A;   THORACENTESIS     x 2   THORACENTESIS N/A 11/07/2020   Procedure: Mathews Robinsons;  Surgeon: [name], MD;  Location: [facility] ENDOSCOPY;  Service: Pulmonary;  Laterality: N/A;   THORACENTESIS N/A 04/06/2021   Procedure: THORACENTESIS;  Surgeon: Freddi Starr, MD;  Location: [facility] ENDOSCOPY;  Service: Pulmonary;  Laterality: N/A;   UPPER ESOPHAGEAL ENDOSCOPIC ULTRASOUND (EUS) N/A 02/22/2021   Procedure: UPPER ESOPHAGEAL ENDOSCOPIC ULTRASOUND (EUS);  Surgeon: Irving Copas., MD;  Location: [place];  Service: Gastroenterology;  Laterality: N/A;     OB History   No obstetric history on file.     Family History  Problem Relation Age of Onset   Seizures Father    CAD Father    Diabetes Sister    Lupus Sister    Hypertension Sister    Colon cancer Neg Hx    Esophageal cancer Neg Hx    Pancreatic cancer Neg Hx    Stomach cancer Neg Hx    Inflammatory bowel disease Neg Hx    Liver disease Neg Hx    Rectal cancer Neg Hx     Social History   Tobacco Use   Smoking status: Former    Types: Cigarettes   Smokeless tobacco: Never  Vaping Use   Vaping Use: Never used  Substance Use Topics   Alcohol use: Not Currently   Drug use: Not Currently    Home Medications Prior to Admission medications   Medication Sig Start Date End Date Taking? Authorizing Provider  acetaminophen (TYLENOL) 325 MG tablet Take 2 tablets (650 mg total) by mouth every 4 (four) hours as needed for headache or mild pain. 11/05/19   Kinnie Feil, MD  albuterol (VENTOLIN HFA) 108 (90 Base) MCG/ACT inhaler Inhale 2 puffs into the lungs every 6 (six) hours as needed for wheezing or shortness of breath. 11/30/19   [name], NP  allopurinol (ZYLOPRIM) 300 MG tablet TAKE 1 TABLET BY MOUTH EVERY NIGHT AT BEDTIME Patient taking differently: Take 300 mg by mouth at bedtime. 03/23/21   [name], NP  atorvastatin (LIPITOR) 80 MG tablet TAKE 1 TABLET (80 MG TOTAL) BY MOUTH AT  BEDTIME. 02/26/21   [name], MD  B Complex-C-Zn-Folic Acid (DIALYVITE 626-RSWN 15) 0.8 MG TABS Take 1 tablet by mouth daily. 01/10/21   [provider]  cetirizine (ZYRTEC) 10 MG tablet Take 10 mg by mouth daily as needed for allergies.    [provider]  cinacalcet (SENSIPAR) 30 MG tablet Take 30 mg by mouth daily with supper.  04/18/20   [provider]  ELIQUIS 5 MG TABS tablet TAKE 1 TABLET (5 MG TOTAL) BY MOUTH 2 (TWO) TIMES DAILY. 04/20/21   [name], MD  esomeprazole (  NEXIUM) 40 MG capsule Take 1 capsule (40 mg total) by mouth 2 (two) times daily before a meal. 02/22/21 02/22/22  Mansouraty, Telford Nab., MD  ferric citrate (AURYXIA) 1 GM 210 MG(Fe) tablet Take 210 mg by mouth 3 (three) times daily with meals.    [provider]  furosemide (LASIX) 40 MG tablet Take 80 mg by mouth See admin instructions. Take 2 tablets (80 mg) by mouth twice daily on non-dialysis days - Sunday, Tuesday, Thursday    [provider]  gabapentin (NEURONTIN) 100 MG capsule Take 1 capsule (100 mg total) by mouth 3 (three) times daily. 04/24/21   [name], NP  lidocaine-prilocaine (EMLA) cream Apply 1 application topically See admin instructions. Apply topically to port access one hour prior to dialysis - Monday, Wednesday, Friday, Saturday 04/07/20   [provider]  metoprolol succinate (TOPROL-XL) 25 MG 24 hr tablet TAKE 1 TABLET BY MOUTH EVERY MORNING & TAKE 1 TABLET EVERY NIGHT AT BEDTIME WITH OR IMMEDIATELY FOLLOWING MEAL Patient taking differently: Take 25 mg by mouth in the morning and at bedtime. 03/27/21   [name], MD  midodrine (PROAMATINE) 10 MG tablet Take 1 tablet (10 mg total) by mouth daily. 11/20/20   Pahwani, Michell Heinrich, MD  montelukast (SINGULAIR) 10 MG tablet Take 1 tablet by mouth once daily Patient taking differently: Take 10 mg by mouth daily as needed (allergies). 11/28/20   [name], NP  Multiple  Vitamins-Minerals (MULTIVITAMIN WITH MINERALS) tablet Take 1 tablet by mouth daily.    [provider]  Nutritional Supplements (,FEEDING SUPPLEMENT, PROSOURCE PLUS) liquid Take 30 mLs by mouth 2 (two) times daily between meals. 11/19/20   Pahwani, Michell Heinrich, MD  triamcinolone 0.1% oint-Eucerin equivalent cream 1:1 mixture Apply topically 3 (three) times daily as needed. APPLY TO DRY AFFECTED AREAS AS NEEDED 3 TIMES ADAY 04/24/21   [name], NP  vitamin B-12 (CYANOCOBALAMIN) 1000 MCG tablet Take 1,000 mcg by mouth daily.    [provider]  vitamin C (VITAMIN C) 500 MG tablet Take 1 tablet (500 mg total) by mouth daily. 11/06/19   Kinnie Feil, MD    Allergies    Black walnut flavor and Shellfish allergy  Review of Systems   Review of Systems  Constitutional:  Negative for chills and fever.  Respiratory:  Negative for shortness of breath.   Cardiovascular:  Negative for chest pain.  Gastrointestinal:  Negative for abdominal pain.  Musculoskeletal:  Positive for arthralgias and back pain. Negative for myalgias.       Bilateral hip pain, worse on right  Skin:  Negative for rash and wound.  Neurological:  Negative for weakness and light-headedness.  All other systems reviewed and are negative.  Physical Exam Updated Vital Signs BP 96/66   Pulse (!) 109   Temp 98.2 F (36.8 C) (Oral)   Resp 16   SpO2 98%   Physical Exam Vitals and nursing note reviewed.  Constitutional:      Appearance: Normal appearance.  HENT:     Head: Normocephalic and atraumatic.  Eyes:     Conjunctiva/sclera: Conjunctivae normal.  Cardiovascular:     Rate and Rhythm: Normal rate. Rhythm irregularly irregular.     Pulses: Normal pulses.     Comments: Documented history of afib Pulmonary:     Effort: Pulmonary effort is normal. No respiratory distress.     Breath sounds: Normal breath sounds.  Abdominal:     General: There is  no distension.     Palpations: Abdomen is  soft.     Tenderness: There is no abdominal tenderness.  Musculoskeletal:     Lumbar back: Tenderness present. No deformity.     Right hip: Tenderness present. No deformity. Normal range of motion. Decreased strength.     Left hip: No deformity.     Comments: Tenderness to palpation over right hip and SI joint. Normal ROM, decreased strength due to pain. Decreased sensation of right lower extremity.  Skin:    General: Skin is warm and dry.  Neurological:     General: No focal deficit present.     Mental Status: She is alert and oriented to person, place, and time.    ED Results / Procedures / Treatments   Labs (all labs ordered are listed, but only abnormal results are displayed) Labs Reviewed - No data to display  EKG None  Radiology DG Lumbar Spine Complete  Result Date: 07/09/2021 CLINICAL DATA:  Fall 2 days ago, low back and bilateral hip pain EXAM: LUMBAR SPINE - COMPLETE 4+ VIEW COMPARISON:  CT abdomen/pelvis dated H 10/2021 FINDINGS: Five lumbar-type vertebral bodies. Normal lumbar lordosis. Mild to moderate compression fracture deformity at L4 with associated sclerosis, favoring a pathologic fracture when correlating with recent CT. Otherwise, no evidence of acute fracture or dislocation. Mild multilevel degenerative changes. Visualized bony pelvis appears intact. IMPRESSION: Mild to moderate pathologic compression fracture at L4, better evaluated on recent CT. Electronically Signed   By: Julian Hy M.D.   On: 07/09/2021 19:04   US BIOPSY (LIVER)  Result Date: 07/09/2021 INDICATION: History of neuroendocrine tumor, now with multiple liver lesions worrisome for metastatic disease. Please perform ultrasound-guided liver lesion biopsy for tissue diagnostic purposes. EXAM: ULTRASOUND GUIDED LIVER LESION BIOPSY COMPARISON:  CT of the chest, abdomen and pelvis-07/06/2021; abdominal MRI-06/19/2021 MEDICATIONS: None ANESTHESIA/SEDATION: Fentanyl 50 mcg IV; Versed 0.5 mg IV Total  Moderate Sedation time:  18 Minutes. The patient's level of consciousness and vital signs were monitored continuously by radiology nursing throughout the procedure under my direct supervision. COMPLICATIONS: None immediate. PROCEDURE: Informed written consent was obtained from the patient after a discussion of the risks, benefits and alternatives to treatment. The patient understands and consents the procedure. A timeout was performed prior to the initiation of the procedure. Ultrasound scanning was performed of the right upper abdominal quadrant demonstrates multiple mixed echogenic though predominantly hypoechoic lesions and masses scattered throughout both the right and left lobes of the liver. A dominant approximately 3.1 x 2.9 cm hypoechoic mass within the right lobe of the liver (image 13) correlating with the lesion seen on preceding abdominal CT image 60, series 2 was targeted for biopsy given lesion location and sonographic window. The procedure was planned. The right upper abdominal quadrant was prepped and draped in the usual sterile fashion. The overlying soft tissues were anesthetized with 1% lidocaine with epinephrine. A 17 gauge, 6.8 cm co-axial needle was advanced into a peripheral aspect of the lesion. This was followed by 6 core biopsies with an 18 gauge core device under direct ultrasound guidance. The coaxial needle tract was embolized with a small amount of Gel-Foam slurry and superficial hemostasis was obtained with manual compression. Post procedural scanning was negative for definitive area of hemorrhage or additional complication. A dressing was placed. The patient tolerated the procedure well without immediate post procedural complication. IMPRESSION: Technically successful ultrasound guided core needle biopsy of indeterminate lesion/mass within the right lobe of the liver. Electronically Signed  By: Sandi Mariscal M.D.   On: 07/09/2021 14:32   DG HIP UNILAT WITH PELVIS 2-3 VIEWS  LEFT  Result Date: 07/09/2021 CLINICAL DATA:  Low back pain bilateral hip pain post fall EXAM: DG HIP (WITH OR WITHOUT PELVIS) 2-3V LEFT COMPARISON:  CT 07/06/2021 FINDINGS: Left pubic rami appear intact. No fracture or malalignment. Mild degenerative changes. IMPRESSION: No acute osseous abnormality Electronically Signed   By: Donavan Foil M.D.   On: 07/09/2021 19:03   DG HIP UNILAT WITH PELVIS 2-3 VIEWS RIGHT  Result Date: 07/09/2021 CLINICAL DATA:  Hip pain post fall EXAM: DG HIP (WITH OR WITHOUT PELVIS) 2-3V RIGHT COMPARISON:  CT 07/06/2021 FINDINGS: SI joints are non widened. Pubic symphysis appears intact. Right femoral head projects in joint. There are mild degenerative changes. IMPRESSION: No definite acute osseous abnormality. Electronically Signed   By: Donavan Foil M.D.   On: 07/09/2021 19:04    Procedures Procedures   Medications Ordered in ED Medications - No data to display  ED Course  I have reviewed the triage vital signs and the nursing notes.  Pertinent labs & imaging results that were available during my care of the patient were reviewed by me and considered in my medical decision making (see chart for details).    MDM Rules/Calculators/A&P                           Patient is 74 y/o female who presents with back and hip pain after fall 3 days ago. Patient fell in her bathroom and landed on her bottom, denies head trauma. Since then she has been experiencing bilateral hip pain, worse on the right, and has difficulty putting weight on her right leg. She has been seen previously at urgent care for low back pain and was given prednisone for likely sciatica which initially helped but she complains of persistence of symptoms. She has never had spine imaging. Denies recent illness, fever, chills, loss of bladder or bowel function, or saddle anesthesia. On exam patient has tenderness over palpation to the right hip and lumbar spine. Normal ROM but decreased strength due to pain.  Decreases sensation of RLE.  XR of bilateral hips and lumbar spine show no acute pathology with mild to moderate pathologic compression fracture at L4.  Due to patient's age, mechanism of injury, and point tenderness to palpation over hip, CT hip ordered to evaluate further for fracture. Ordered TSLO brace for patient to wear while ambulating which should provide support to alleviate some pain. If CT negative for hip fracture, patient will likely be discharged with brace and should follow up with orthopedics for workup and management of persistent back pain.  Patient discussed and signed out to [employer] at shift change.  Final Clinical Impression(s) / ED Diagnoses Final diagnoses:  Fall, initial encounter  Hip pain, acute, right    Rx / DC Orders ED Discharge Orders     None        Estill Cotta 07/09/21 2345    Lacretia Leigh, MD 07/12/21 1353

## 2021-07-09 NOTE — Discharge Instructions (Addendum)
Imaging today did not show any acute changes. We recommend that you follow-up with orthopedic specialist for ongoing management. Wear back brace when up and about for added support. Can follow-up with your primary care doctor in the interim. Return here for new concerns.

## 2021-07-10 ENCOUNTER — Ambulatory Visit (INDEPENDENT_AMBULATORY_CARE_PROVIDER_SITE_OTHER): Payer: Self-pay | Admitting: *Deleted

## 2021-07-10 ENCOUNTER — Emergency Department (HOSPITAL_COMMUNITY): Payer: Medicare Other

## 2021-07-10 NOTE — Telephone Encounter (Signed)
Ok to make visit virtual

## 2021-07-10 NOTE — Telephone Encounter (Signed)
Reason for Disposition  [1] Caller requesting NON-URGENT health information AND [2] PCP's office is the best resource  Answer Assessment - Initial Assessment Questions 1. REASON FOR CALL or QUESTION: "What is your reason for calling today?" or "How can I best help you?" or "What question do you have that I can help answer?"     Daughter Delmer Islam calling in.   Glynda just left the ED this morning.   She had a biopsy yesterday.   She went to ED because right leg bothering her.   This is 3rd trip to ED.    She fell Friday night in the bathroom.   She goes to dialysis supposed to go this morning.    We can't get her down the steps this morning.    Ivanka can't put any weight on her right leg.  Daughter didn't get to talk with anyone from the ED.   Pt said she fractured her tailbone.   [name] doesn't really know what to do.    They put a brace on her in the ED.    My mother can't get up and down the steps.   There are 6 steps she has to go in and out of.   She's still in pain.   She's had these symptoms for 3 days now the pain in right hip.   She  has heart problems.   She has an appt with [name], NP on 07/17/2021.   I don't know what to do.   How do I get her to dialysis?    What am I supposed to do?   I can't get her out of the house.  Protocols used: Information Only Call - No Triage-A-AH

## 2021-07-10 NOTE — Telephone Encounter (Signed)
Please advise 

## 2021-07-10 NOTE — Telephone Encounter (Signed)
Daughter Melissa Adams called in.   She just brought her mother home from the ED early this morning.   Her mother has been seen in the [facility] twice and now the [facility] ED last night.   Her mother fell Friday night in the bathroom and has been c/o right hip pain with pain down her right leg with numbness.   "I had to drop my mother off at the ED and came back to pick up her when they called me early this morning".   "I haven't gotten to talk with anyone about what they found wrong with her hip/leg".     "She had a biopsy done yesterday and then I took her to the ED from there because she was in so much pain and can't walk on her right leg".  "I don't know what to do or what is going on".    "The discharge paper has instructions to call Dr. Paralee Cancel, a ortho dr". "She is supposed to go to dialysis today, tomorrow and the next day".   "There are 6 steps into our apt".   "My son had to come literally help/lift my mother up the steps to get her into the apt this morning".    "I can't lift her".   "She can't walk".    "I don't know what to do".     I listened and gave encouragement.   I suggested she try calling the [employer] because they offer specialized transportation called [employer].    I also suggested she call the dialysis center where her mother goes for treatment to see if they could give her the names of some of the specialized transportation companies their pts use since a lot of their pts do not drive and use these services.    She was agreeable to these suggestions.      Her mother has an appt with [name], NP at [facility] on 07/17/2021.   Melissa Adams has requested a call from the practice about what to do in regards to this appt.   She's not sure she is going to be able to get her mother there.   Can it be a virtual visit maybe?    I let Melissa Adams know I would forward all this information to [name], NP and see what they could work out.  Melissa Adams  thanked me for my help and for listening.   "I'm just at the point I don't know what to do" per Melissa Adams.     I forwarded my notes to [place].

## 2021-07-10 NOTE — ED Notes (Signed)
Ortho tech applied TSLO brace.

## 2021-07-11 NOTE — Telephone Encounter (Signed)
Appointment has been changed to virtual. Patient daughter is aware.

## 2021-07-13 ENCOUNTER — Other Ambulatory Visit (INDEPENDENT_AMBULATORY_CARE_PROVIDER_SITE_OTHER): Payer: Self-pay | Admitting: Primary Care

## 2021-07-13 NOTE — Telephone Encounter (Signed)
  Notes to clinic: Patient has appt on 07/20/2021 Review for refill until appt   Requested Prescriptions  Pending Prescriptions Disp Refills   allopurinol (ZYLOPRIM) 300 MG tablet [Pharmacy Med Name: Allopurinol 300MG  TABS] 30 tablet 3    Sig: TAKE 1 TABLET BY MOUTH EVERY NIGHT AT BEDTIME     Endocrinology:  Gout Agents Failed - 07/13/2021 11:57 AM      Failed - Uric Acid in normal range and within 360 days    No results found for: POCURA, LABURIC        Failed - Cr in normal range and within 360 days    Creatinine  Date Value Ref Range Status  04/12/2021 5.57 (HH) 0.44 - 1.00 mg/dL Final    Comment:    CRITICAL RESULT CALLED TO, READ BACK BY AND VERIFIED WITH: beth tracey at 1110 by hflynt 04/12/21    Creatinine, Ser  Date Value Ref Range Status  07/09/2021 6.57 (H) 0.44 - 1.00 mg/dL Final   Creatinine, Urine  Date Value Ref Range Status  10/12/2019 59.39 mg/dL Final          Passed - Valid encounter within last 12 months    Recent Outpatient Visits           2 months ago Screening for diabetes mellitus   [place], Michelle P, NP   7 months ago Hospital discharge follow-up   [place], [place], NP   1 year ago Need for Tdap vaccination   [place] [name], NP   1 year ago Need for Tdap vaccination   [place] [name], NP   1 year ago Encounter to establish care   [place] [name], NP       Future Appointments             In 4 days [name], NP [place]

## 2021-07-16 LAB — SURGICAL PATHOLOGY

## 2021-07-17 ENCOUNTER — Telehealth (INDEPENDENT_AMBULATORY_CARE_PROVIDER_SITE_OTHER): Payer: Medicare Other | Admitting: Primary Care

## 2021-07-17 ENCOUNTER — Other Ambulatory Visit: Payer: Self-pay

## 2021-07-17 ENCOUNTER — Emergency Department (HOSPITAL_COMMUNITY): Payer: Medicare Other

## 2021-07-17 ENCOUNTER — Other Ambulatory Visit: Payer: Self-pay | Admitting: Hematology and Oncology

## 2021-07-17 ENCOUNTER — Other Ambulatory Visit (INDEPENDENT_AMBULATORY_CARE_PROVIDER_SITE_OTHER): Payer: Self-pay | Admitting: Primary Care

## 2021-07-17 ENCOUNTER — Ambulatory Visit (INDEPENDENT_AMBULATORY_CARE_PROVIDER_SITE_OTHER): Payer: Medicare Other | Admitting: Pulmonary Disease

## 2021-07-17 ENCOUNTER — Inpatient Hospital Stay (HOSPITAL_COMMUNITY)
Admission: EM | Admit: 2021-07-17 | Discharge: 2021-08-01 | DRG: 477 | Disposition: A | Discharge status: Home Health | Payer: Medicare Other | Attending: Family Medicine | Admitting: Family Medicine

## 2021-07-17 ENCOUNTER — Encounter: Payer: Self-pay | Admitting: Pulmonary Disease

## 2021-07-17 VITALS — BP 128/86 | HR 93

## 2021-07-17 DIAGNOSIS — Z9889 Other specified postprocedural states: Secondary | ICD-10-CM

## 2021-07-17 DIAGNOSIS — K5900 Constipation, unspecified: Secondary | ICD-10-CM | POA: Diagnosis not present

## 2021-07-17 DIAGNOSIS — R5381 Other malaise: Secondary | ICD-10-CM | POA: Diagnosis not present

## 2021-07-17 DIAGNOSIS — R531 Weakness: Secondary | ICD-10-CM | POA: Diagnosis not present

## 2021-07-17 DIAGNOSIS — Z66 Do not resuscitate: Secondary | ICD-10-CM | POA: Diagnosis not present

## 2021-07-17 DIAGNOSIS — M898X9 Other specified disorders of bone, unspecified site: Secondary | ICD-10-CM | POA: Diagnosis present

## 2021-07-17 DIAGNOSIS — M545 Low back pain, unspecified: Secondary | ICD-10-CM

## 2021-07-17 DIAGNOSIS — N2581 Secondary hyperparathyroidism of renal origin: Secondary | ICD-10-CM | POA: Diagnosis present

## 2021-07-17 DIAGNOSIS — I429 Cardiomyopathy, unspecified: Secondary | ICD-10-CM | POA: Diagnosis present

## 2021-07-17 DIAGNOSIS — C229 Malignant neoplasm of liver, not specified as primary or secondary: Secondary | ICD-10-CM

## 2021-07-17 DIAGNOSIS — C787 Secondary malignant neoplasm of liver and intrahepatic bile duct: Secondary | ICD-10-CM | POA: Diagnosis not present

## 2021-07-17 DIAGNOSIS — I272 Pulmonary hypertension, unspecified: Secondary | ICD-10-CM | POA: Diagnosis present

## 2021-07-17 DIAGNOSIS — Z20822 Contact with and (suspected) exposure to covid-19: Secondary | ICD-10-CM | POA: Diagnosis present

## 2021-07-17 DIAGNOSIS — M47816 Spondylosis without myelopathy or radiculopathy, lumbar region: Secondary | ICD-10-CM | POA: Diagnosis present

## 2021-07-17 DIAGNOSIS — M48061 Spinal stenosis, lumbar region without neurogenic claudication: Secondary | ICD-10-CM | POA: Diagnosis present

## 2021-07-17 DIAGNOSIS — Z833 Family history of diabetes mellitus: Secondary | ICD-10-CM

## 2021-07-17 DIAGNOSIS — K7460 Unspecified cirrhosis of liver: Secondary | ICD-10-CM | POA: Diagnosis present

## 2021-07-17 DIAGNOSIS — Z832 Family history of diseases of the blood and blood-forming organs and certain disorders involving the immune mechanism: Secondary | ICD-10-CM

## 2021-07-17 DIAGNOSIS — Z79899 Other long term (current) drug therapy: Secondary | ICD-10-CM

## 2021-07-17 DIAGNOSIS — Z8249 Family history of ischemic heart disease and other diseases of the circulatory system: Secondary | ICD-10-CM

## 2021-07-17 DIAGNOSIS — R0600 Dyspnea, unspecified: Secondary | ICD-10-CM

## 2021-07-17 DIAGNOSIS — Z87891 Personal history of nicotine dependence: Secondary | ICD-10-CM

## 2021-07-17 DIAGNOSIS — N186 End stage renal disease: Secondary | ICD-10-CM | POA: Diagnosis present

## 2021-07-17 DIAGNOSIS — I5022 Chronic systolic (congestive) heart failure: Secondary | ICD-10-CM | POA: Diagnosis present

## 2021-07-17 DIAGNOSIS — D631 Anemia in chronic kidney disease: Secondary | ICD-10-CM | POA: Diagnosis present

## 2021-07-17 DIAGNOSIS — J45909 Unspecified asthma, uncomplicated: Secondary | ICD-10-CM | POA: Diagnosis present

## 2021-07-17 DIAGNOSIS — I132 Hypertensive heart and chronic kidney disease with heart failure and with stage 5 chronic kidney disease, or end stage renal disease: Secondary | ICD-10-CM | POA: Diagnosis present

## 2021-07-17 DIAGNOSIS — Z992 Dependence on renal dialysis: Secondary | ICD-10-CM

## 2021-07-17 DIAGNOSIS — N63 Unspecified lump in unspecified breast: Secondary | ICD-10-CM

## 2021-07-17 DIAGNOSIS — Z6837 Body mass index (BMI) 37.0-37.9, adult: Secondary | ICD-10-CM

## 2021-07-17 DIAGNOSIS — Z91013 Allergy to seafood: Secondary | ICD-10-CM

## 2021-07-17 DIAGNOSIS — M8458XA Pathological fracture in neoplastic disease, other specified site, initial encounter for fracture: Principal | ICD-10-CM | POA: Diagnosis present

## 2021-07-17 DIAGNOSIS — E46 Unspecified protein-calorie malnutrition: Secondary | ICD-10-CM | POA: Diagnosis present

## 2021-07-17 DIAGNOSIS — I482 Chronic atrial fibrillation, unspecified: Secondary | ICD-10-CM | POA: Diagnosis present

## 2021-07-17 DIAGNOSIS — J90 Pleural effusion, not elsewhere classified: Secondary | ICD-10-CM | POA: Diagnosis not present

## 2021-07-17 DIAGNOSIS — Z7901 Long term (current) use of anticoagulants: Secondary | ICD-10-CM

## 2021-07-17 DIAGNOSIS — C50911 Malignant neoplasm of unspecified site of right female breast: Secondary | ICD-10-CM | POA: Diagnosis present

## 2021-07-17 DIAGNOSIS — M4856XA Collapsed vertebra, not elsewhere classified, lumbar region, initial encounter for fracture: Secondary | ICD-10-CM | POA: Diagnosis present

## 2021-07-17 DIAGNOSIS — Z91018 Allergy to other foods: Secondary | ICD-10-CM

## 2021-07-17 DIAGNOSIS — G893 Neoplasm related pain (acute) (chronic): Secondary | ICD-10-CM | POA: Diagnosis present

## 2021-07-17 DIAGNOSIS — K761 Chronic passive congestion of liver: Secondary | ICD-10-CM | POA: Diagnosis present

## 2021-07-17 DIAGNOSIS — C7951 Secondary malignant neoplasm of bone: Secondary | ICD-10-CM | POA: Diagnosis present

## 2021-07-17 DIAGNOSIS — F40240 Claustrophobia: Secondary | ICD-10-CM | POA: Diagnosis present

## 2021-07-17 DIAGNOSIS — R188 Other ascites: Secondary | ICD-10-CM | POA: Diagnosis present

## 2021-07-17 DIAGNOSIS — I959 Hypotension, unspecified: Secondary | ICD-10-CM | POA: Diagnosis not present

## 2021-07-17 LAB — COMPREHENSIVE METABOLIC PANEL
ALT: 37 U/L (ref 0–44)
AST: 58 U/L — ABNORMAL HIGH (ref 15–41)
Albumin: 3.6 g/dL (ref 3.5–5.0)
Alkaline Phosphatase: 238 U/L — ABNORMAL HIGH (ref 38–126)
Anion gap: 15 (ref 5–15)
BUN: 29 mg/dL — ABNORMAL HIGH (ref 8–23)
CO2: 23 mmol/L (ref 22–32)
Calcium: 9.6 mg/dL (ref 8.9–10.3)
Chloride: 95 mmol/L — ABNORMAL LOW (ref 98–111)
Creatinine, Ser: 5.81 mg/dL — ABNORMAL HIGH (ref 0.44–1.00)
GFR, Estimated: 7 mL/min — ABNORMAL LOW (ref >60)
Glucose, Bld: 86 mg/dL (ref 70–99)
Potassium: 3.8 mmol/L (ref 3.5–5.1)
Sodium: 133 mmol/L — ABNORMAL LOW (ref 135–145)
Total Bilirubin: 1.5 mg/dL — ABNORMAL HIGH (ref 0.3–1.2)
Total Protein: 6.7 g/dL (ref 6.5–8.1)

## 2021-07-17 LAB — RESP PANEL BY RT-PCR (FLU A&B, COVID) ARPGX2
Influenza A by PCR: NEGATIVE
Influenza B by PCR: NEGATIVE
SARS Coronavirus 2 by RT PCR: NEGATIVE

## 2021-07-17 LAB — CBC WITH DIFFERENTIAL/PLATELET
Abs Immature Granulocytes: 0.03 10*3/uL (ref 0.00–0.07)
Basophils Absolute: 0 10*3/uL (ref 0.0–0.1)
Basophils Relative: 0 %
Eosinophils Absolute: 0 10*3/uL (ref 0.0–0.5)
Eosinophils Relative: 0 %
HCT: 36.9 % (ref 36.0–46.0)
Hemoglobin: 11.3 g/dL — ABNORMAL LOW (ref 12.0–15.0)
Immature Granulocytes: 0 %
Lymphocytes Relative: 9 %
Lymphs Abs: 0.8 10*3/uL (ref 0.7–4.0)
MCH: 30.4 pg (ref 26.0–34.0)
MCHC: 30.6 g/dL (ref 30.0–36.0)
MCV: 99.2 fL (ref 80.0–100.0)
Monocytes Absolute: 1 10*3/uL (ref 0.1–1.0)
Monocytes Relative: 11 %
Neutro Abs: 6.9 10*3/uL (ref 1.7–7.7)
Neutrophils Relative %: 80 %
Platelets: 172 10*3/uL (ref 150–400)
RBC: 3.72 MIL/uL — ABNORMAL LOW (ref 3.87–5.11)
RDW: 17.3 % — ABNORMAL HIGH (ref 11.5–15.5)
WBC: 8.7 10*3/uL (ref 4.0–10.5)
nRBC: 0 % (ref 0.0–0.2)

## 2021-07-17 MED ORDER — OXYCODONE-ACETAMINOPHEN 5-325 MG PO TABS
1.0000 | ORAL_TABLET | Freq: Once | ORAL | Status: AC
  Administered 2021-07-17: 1 via ORAL
  Filled 2021-07-17: qty 1

## 2021-07-17 NOTE — Patient Instructions (Addendum)
I recommend that you be admitted to the [facility] in order to control your pain and finalize a plan with Oncology for treatment of your cancer.

## 2021-07-17 NOTE — Progress Notes (Signed)
Synopsis: Referred in January 2022 by [name], NP for Pleural Effusion  Subjective:   PATIENT ID: Melissa Adams: female DOB: Nov 27, 1945, MRN: [id number]   HPI  Chief Complaint  Patient presents with   Follow-up    6 mo f/u for pleural effusion. Per son, patient has been unable to walk for the past week. Feels like the fluid is building up on the right side again.    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   She had repeat thoracentesis in May 2022 again with no malignant cells noted on cytology.   She is accompanied by her son in law today. She has been to the ER twice this month for low back pain and inability to walk over the past two weeks. She underwent liver biopsy on 07/09/21 which is showing adenocarcinoma of unknown primary. Imaging is concerning for a metastatic T4 compression fracture.   She has been in excruciating pain over the past few weeks and has not been able to walk on her own over the past couple of weeks. Her son in law reports the firemen have been having to help her get back into the home as they live on the second floor.    OV 01/02/21 She was last seen in clinic on 10/12/20 by Dr. Silas Flood and underwent thoracentesis on 11/07/20 where 1461mL of serosanguinous fluid was removed. The pleural fluid was lymphocyte predominant with an LDH of 141 and total protein of 3.4, albumin 2.2 with cytology negative for malignant cells.    She has had undergone 4 thoracenteses in the past year with pleural fluid studies concerning for an exudative effusion.   She was admitted 11/14/20 to 11/19/20 for GI bleeding and underwent her 4th thoracentesis during that stay.    She has progressive shortness of breath when the fluid re-accumulates. Today she feels well and is not experiencing any  shortness of breath.   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   Cardiomyopathy (HCC)    CHF (congestive heart failure) (HCC)    Chronic kidney disease    Edema    History of hemodialysis    Hyperlipidemia    Hypertension    Hypotension    on midodrine (as of 06/15/21)   Pulmonary hypertension (HCC)    Recurrent right pleural effusion    As of 06/15/21: s/p thoracenteses 05/05/20, 06/11/20, 08/17/20, 11/18/20, 04/06/21   Sleep apnea    Tricuspid regurgitation      Family History  Problem Relation Age of Onset   Seizures Father    CAD Father    Diabetes Sister    Lupus Sister    Hypertension Sister    Colon cancer Neg Hx    Esophageal cancer Neg Hx    Pancreatic cancer Neg Hx    Stomach cancer Neg Hx    Inflammatory bowel disease Neg Hx    Liver disease Neg Hx    Rectal cancer Neg Hx      Social History   Socioeconomic History   Marital status: Widowed    Spouse name: Not on file   Number of children: Not on file   Years of education: Not on file  Highest education level: Not on file  Occupational History   Not on file  Tobacco Use   Smoking status: Former    Types: Cigarettes   Smokeless tobacco: Never  Vaping Use   Vaping Use: Never used  Substance and Sexual Activity   Alcohol use: Not Currently   Drug use: Not Currently   Sexual activity: Not Currently  Other Topics Concern   Not on file  Social History Narrative   Not on file   Social Determinants of Health   Financial Resource Strain: Not on file  Food Insecurity: Not on file  Transportation Needs: Not on file  Physical Activity: Not on file  Stress: Not on file  Social Connections: Not on file  Intimate Partner Violence: Not on file     Allergies  Allergen Reactions   Black Walnut Flavor Hives   Shellfish Allergy Itching    Makes throat itch      Outpatient Medications Prior to Visit   Medication Sig Dispense Refill   acetaminophen (TYLENOL) 325 MG tablet Take 2 tablets (650 mg total) by mouth every 4 (four) hours as needed for headache or mild pain.     albuterol (VENTOLIN HFA) 108 (90 Base) MCG/ACT inhaler Inhale 2 puffs into the lungs every 6 (six) hours as needed for wheezing or shortness of breath. 6.7 g 1   allopurinol (ZYLOPRIM) 300 MG tablet TAKE 1 TABLET BY MOUTH EVERY NIGHT AT BEDTIME (Patient taking differently: Take 300 mg by mouth at bedtime.) 30 tablet 3   atorvastatin (LIPITOR) 80 MG tablet TAKE 1 TABLET (80 MG TOTAL) BY MOUTH AT BEDTIME. 30 tablet 5   B Complex-C-Zn-Folic Acid (DIALYVITE 102-HENI 15) 0.8 MG TABS Take 1 tablet by mouth daily.     cetirizine (ZYRTEC) 10 MG tablet Take 10 mg by mouth daily as needed for allergies.     cinacalcet (SENSIPAR) 30 MG tablet Take 30 mg by mouth daily with supper.      ELIQUIS 5 MG TABS tablet TAKE 1 TABLET (5 MG TOTAL) BY MOUTH 2 (TWO) TIMES DAILY. 60 tablet 3   esomeprazole (NEXIUM) 40 MG capsule Take 1 capsule (40 mg total) by mouth 2 (two) times daily before a meal. 30 capsule 1   ferric citrate (AURYXIA) 1 GM 210 MG(Fe) tablet Take 210 mg by mouth 3 (three) times daily with meals.     furosemide (LASIX) 40 MG tablet Take 80 mg by mouth See admin instructions. Take 2 tablets (80 mg) by mouth twice daily on non-dialysis days - Sunday, Tuesday, Thursday     gabapentin (NEURONTIN) 100 MG capsule Take 1 capsule (100 mg total) by mouth 3 (three) times daily. 90 capsule 1   lidocaine-prilocaine (EMLA) cream Apply 1 application topically See admin instructions. Apply topically to port access one hour prior to dialysis - Monday, Wednesday, Friday, Saturday     metoprolol succinate (TOPROL-XL) 25 MG 24 hr tablet TAKE 1 TABLET BY MOUTH EVERY MORNING & TAKE 1 TABLET EVERY NIGHT AT BEDTIME WITH OR IMMEDIATELY FOLLOWING MEAL (Patient taking differently: Take 25 mg by mouth in the morning and at bedtime.) 60 tablet 11   midodrine  (PROAMATINE) 10 MG tablet Take 1 tablet (10 mg total) by mouth daily. 30 tablet 0   montelukast (SINGULAIR) 10 MG tablet Take 1 tablet by mouth once daily (Patient taking differently: Take 10 mg by mouth daily as needed (allergies).) 90 tablet 0   Multiple Vitamins-Minerals (MULTIVITAMIN WITH MINERALS) tablet Take 1 tablet by mouth  daily.     Nutritional Supplements (,FEEDING SUPPLEMENT, PROSOURCE PLUS) liquid Take 30 mLs by mouth 2 (two) times daily between meals.     triamcinolone 0.1% oint-Eucerin equivalent cream 1:1 mixture Apply topically 3 (three) times daily as needed. APPLY TO DRY AFFECTED AREAS AS NEEDED 3 TIMES ADAY 360 g 1   vitamin B-12 (CYANOCOBALAMIN) 1000 MCG tablet Take 1,000 mcg by mouth daily.     vitamin C (VITAMIN C) 500 MG tablet Take 1 tablet (500 mg total) by mouth daily. 30 tablet 0   No facility-administered medications prior to visit.    Review of Systems  Constitutional:  Negative for chills, fever, malaise/fatigue and weight loss.  HENT:  Negative for congestion, sinus pain and sore throat.   Eyes: Negative.   Respiratory:  Negative for cough, hemoptysis, sputum production, shortness of breath and wheezing.   Cardiovascular:  Negative for chest pain, palpitations, orthopnea, claudication and leg swelling.  Gastrointestinal:  Negative for abdominal pain, heartburn, nausea and vomiting.  Genitourinary: Negative.   Musculoskeletal:  Positive for back pain. Negative for joint pain and myalgias.  Skin:  Negative for rash.  Neurological:  Positive for sensory change and weakness.  Endo/Heme/Allergies: Negative.   Psychiatric/Behavioral: Negative.      Objective:   Vitals:   07/17/21 1609  BP: 128/86  Pulse: 93  SpO2: 94%   Physical Exam Constitutional:      General: She is in acute distress.     Appearance: She is obese.  HENT:     Head: Normocephalic and atraumatic.  Eyes:     General: No scleral icterus.    Conjunctiva/sclera: Conjunctivae normal.      Pupils: Pupils are equal, round, and reactive to light.  Cardiovascular:     Rate and Rhythm: Normal rate and regular rhythm.     Pulses: Normal pulses.     Heart sounds: Normal heart sounds. No murmur heard. Pulmonary:     Effort: Pulmonary effort is normal.     Breath sounds: No wheezing, rhonchi or rales.  Abdominal:     General: Bowel sounds are normal.     Palpations: Abdomen is soft.  Musculoskeletal:     Right lower leg: No edema.     Left lower leg: No edema.  Lymphadenopathy:     Cervical: No cervical adenopathy.  Skin:    General: Skin is warm and dry.  Neurological:     Mental Status: She is alert.     Motor: Weakness (lower extremities) present.     Gait: Gait abnormal.   CBC    Component Value Date/Time   WBC 7.2 07/09/2021 1130   RBC 3.75 (L) 07/09/2021 1130   HGB 11.5 (L) 07/09/2021 1130   HGB 10.5 (L) 04/12/2021 1017   HGB 11.4 06/22/2020 1126   HCT 36.7 07/09/2021 1130   HCT 35.1 06/22/2020 1126   PLT 219 07/09/2021 1130   PLT 160 04/12/2021 1017   PLT 141 (L) 06/22/2020 1126   MCV 97.9 07/09/2021 1130   MCV 96 06/22/2020 1126   MCH 30.7 07/09/2021 1130   MCHC 31.3 07/09/2021 1130   RDW 16.3 (H) 07/09/2021 1130   RDW 14.9 06/22/2020 1126   LYMPHSABS 1.2 04/12/2021 1017   LYMPHSABS 1.4 06/22/2020 1126   MONOABS 1.0 04/12/2021 1017   EOSABS 0.0 04/12/2021 1017   EOSABS 0.0 06/22/2020 1126   BASOSABS 0.0 04/12/2021 1017   BASOSABS 0.0 06/22/2020 1126   BMP Latest Ref Rng & Units 07/09/2021 06/19/2021  06/19/2021  Glucose 70 - 99 mg/dL 81 89 93  BUN 8 - 23 mg/dL 63(H) 48(H) 39(H)  Creatinine 0.44 - 1.00 mg/dL 6.57(H) 5.90(H) 5.78(H)  BUN/Creat Ratio 12 - 28 - - -  Sodium 135 - 145 mmol/L 135 133(L) 135  Potassium 3.5 - 5.1 mmol/L 4.2 5.4(H) 5.0  Chloride 98 - 111 mmol/L 91(L) 96(L) 92(L)  CO2 22 - 32 mmol/L 28 - 31  Calcium 8.9 - 10.3 mg/dL 9.4 - 9.2   Chest imaging: CT Chest abdomen/pelvis 07/06/21 Possible 2.6 cm right lateral breast lesion,  raising concern for primary breast neoplasm. Overlying skin thickening. Associated right axillary lymphadenopathy. Mammographic correlation is suggested.   Small bilateral pulmonary nodules measuring up to 5 mm, new from July 2021, raising concern for metastases. Small to moderate right pleural effusion, partially loculated.   Multifocal hepatic metastases throughout both lobes, new from December 2021.   Mild to moderate pathologic compression fracture at L4. No retropulsion.  PFT: No flowsheet data found.  Path: Cytology negative on 04/06/21, 11/18/20, 11/07/20 and 06/11/20 for malignant cells  Echo: 11/17/20  1. Left ventricular ejection fraction, by estimation, is 35 to 40%. The  left ventricle has moderately decreased function. The left ventricle  demonstrates global hypokinesis. There is mild concentric left ventricular  hypertrophy. Left ventricular  diastolic parameters are indeterminate.   2. Right ventricular systolic function is normal. The right ventricular  size is normal. There is moderately elevated pulmonary artery systolic  pressure.   3. The mitral valve is normal in structure. Mild mitral valve  regurgitation. No evidence of mitral stenosis.   4. Tricuspid valve regurgitation is moderate to severe.   5. There is restriction of the right and noncoronary cusps. Visually  there appears to be at least moderate aortic stenosis. LVOT VTI was not  recorded, so cannot calculate a dimensionless index. Cannot exclude  low-flow, low gradient aortic stenosis. The  aortic valve is calcified. There is moderate calcification of the aortic  valve. There is moderate thickening of the aortic valve. Aortic valve  regurgitation is not visualized. Mild to moderate aortic valve  sclerosis/calcification is present, without any  evidence of aortic stenosis. Aortic valve mean gradient measures 9.0 mmHg.  Aortic valve Vmax measures 2.13 m/s.   6. The inferior vena cava is dilated in  size with <50% respiratory  variability, suggesting right atrial pressure of 15 mmHg.    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   She has intractable pain of her back due to T4 compression fracture in setting of metastatic cancer of unknown primary.   She needs to be admitted for pain control and inability to walk independently. She will need PT evaluation. She will need oncology consult for further workup and treatment of her metastatic disease. She will also need nephrology for her HD needs.  I spoke with her daughter, Delmer Islam, on speaker phone and informed her of the plan.   EMS has been called to take her to the [facility] ER. I spoke with the triage team in the ER and made them aware of her arrival along with request for admission.   >60 minutes was spent reviewing patient's records/imaging, direct patient care, speaking with patient's daughter and  completing documentation.   Freda Jackson, MD [facility] Office: [phone]    Current Outpatient Medications:    acetaminophen (TYLENOL) 325 MG tablet, Take 2 tablets (650 mg total) by mouth every 4 (four) hours as needed for headache or mild pain., Disp:  , Rfl:    albuterol (VENTOLIN HFA) 108 (90 Base) MCG/ACT inhaler, Inhale 2 puffs into the lungs every 6 (six) hours as needed for wheezing or shortness of breath., Disp: 6.7 g, Rfl: 1   allopurinol (ZYLOPRIM) 300 MG tablet, TAKE 1 TABLET BY MOUTH EVERY NIGHT AT BEDTIME (Patient taking differently: Take 300 mg by mouth at bedtime.), Disp: 30 tablet, Rfl: 3   atorvastatin (LIPITOR) 80 MG tablet, TAKE 1 TABLET (80 MG TOTAL) BY MOUTH AT BEDTIME., Disp: 30 tablet, Rfl: 5   B  Complex-C-Zn-Folic Acid (DIALYVITE 678-LFYB 15) 0.8 MG TABS, Take 1 tablet by mouth daily., Disp: , Rfl:    cetirizine (ZYRTEC) 10 MG tablet, Take 10 mg by mouth daily as needed for allergies., Disp: , Rfl:    cinacalcet (SENSIPAR) 30 MG tablet, Take 30 mg by mouth daily with supper. , Disp: , Rfl:    ELIQUIS 5 MG TABS tablet, TAKE 1 TABLET (5 MG TOTAL) BY MOUTH 2 (TWO) TIMES DAILY., Disp: 60 tablet, Rfl: 3   esomeprazole (NEXIUM) 40 MG capsule, Take 1 capsule (40 mg total) by mouth 2 (two) times daily before a meal., Disp: 30 capsule, Rfl: 1   ferric citrate (AURYXIA) 1 GM 210 MG(Fe) tablet, Take 210 mg by mouth 3 (three) times daily with meals., Disp: , Rfl:    furosemide (LASIX) 40 MG tablet, Take 80 mg by mouth See admin instructions. Take 2 tablets (80 mg) by mouth twice daily on non-dialysis days - Sunday, Tuesday, Thursday, Disp: , Rfl:    gabapentin (NEURONTIN) 100 MG capsule, Take 1 capsule (100 mg total) by mouth 3 (three) times daily., Disp: 90 capsule, Rfl: 1   lidocaine-prilocaine (EMLA) cream, Apply 1 application topically See admin instructions. Apply topically to port access one hour prior to dialysis - Monday, Wednesday, Friday, Saturday, Disp: , Rfl:    metoprolol succinate (TOPROL-XL) 25 MG 24 hr tablet, TAKE 1 TABLET BY MOUTH EVERY MORNING & TAKE 1 TABLET EVERY NIGHT AT BEDTIME WITH OR IMMEDIATELY FOLLOWING MEAL (Patient taking differently: Take 25 mg by mouth in the morning and at bedtime.), Disp: 60 tablet, Rfl: 11   midodrine (PROAMATINE) 10 MG tablet, Take 1 tablet (10 mg total) by mouth daily., Disp: 30 tablet, Rfl: 0   montelukast (SINGULAIR) 10 MG tablet, Take 1 tablet by mouth once daily (Patient taking differently: Take 10 mg by mouth daily as needed (allergies).), Disp: 90 tablet, Rfl: 0   Multiple Vitamins-Minerals (MULTIVITAMIN WITH MINERALS) tablet, Take 1 tablet by mouth daily., Disp: , Rfl:    Nutritional Supplements (,FEEDING SUPPLEMENT, PROSOURCE PLUS) liquid, Take  30 mLs by mouth 2 (two) times daily between meals., Disp: , Rfl:    triamcinolone 0.1% oint-Eucerin equivalent cream 1:1 mixture, Apply topically 3 (three) times daily as needed. APPLY TO DRY AFFECTED AREAS AS NEEDED 3 TIMES ADAY, Disp: 360 g, Rfl: 1   vitamin B-12 (CYANOCOBALAMIN) 1000 MCG tablet, Take 1,000 mcg by mouth daily., Disp: , Rfl:    vitamin C (VITAMIN C) 500 MG tablet, Take 1 tablet (500 mg total) by mouth daily., Disp: 30 tablet, Rfl: 0

## 2021-07-17 NOTE — Progress Notes (Signed)
[facility] Telephone Note  I connected with Melissa Adams, on 07/17/2021 at 10:28 AM by telephone and verified that I am speaking with the correct person using two identifiers.   Consent: I discussed the limitations, risks, security and privacy concerns of performing an evaluation and management service by telephone and the availability of in person appointments. I also discussed with the patient that there may be a patient responsible charge related to this service. The patient expressed understanding and agreed to proceed.   Location of Patient: Home   Location of Provider: [facility] [place]   Persons participating in Telemedicine visit: [place],  NP Llana Aliment , CMA  History of Present Illness: Melissa Adams is a 75 year old female that has had a sudden decline in health and hurts all over every where in her body. She has been seen in ED several times and no fractures or obvious causes. She rated her pain 20 on a scale 0-10. Pain is causing her difficulty walking and moving around without assistance . Daughter and son in law are having difficulty trying to care for her. Advised to keep scheduled oncology appt. Find out if there are causative factors and a discussion has to be made what are the next steps for care . Refer for [facility] for evaluation and services .   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   Cardiomyopathy ([place])    CHF (congestive heart failure) ([place])    Chronic kidney disease    Edema    History of hemodialysis    Hyperlipidemia    Hypertension    Hypotension    on midodrine (as of 06/15/21)   Pulmonary hypertension ([facility])    Recurrent right pleural effusion    As of 06/15/21: s/p thoracenteses 05/05/20, 06/11/20, 08/17/20, 11/18/20, 04/06/21   Sleep apnea    Tricuspid  regurgitation    Allergies  Allergen Reactions   Black Walnut Flavor Hives   Shellfish Allergy Itching    Makes throat itch     Current Outpatient Medications on File Prior to Visit  Medication Sig Dispense Refill   acetaminophen (TYLENOL) 325 MG tablet Take 2 tablets (650 mg total) by mouth every 4 (four) hours as needed for headache or mild pain.     albuterol (VENTOLIN HFA) 108 (90 Base) MCG/ACT inhaler Inhale 2 puffs into the lungs every 6 (six) hours as needed for wheezing or shortness of breath. 6.7 g 1   allopurinol (ZYLOPRIM) 300 MG tablet TAKE 1 TABLET BY MOUTH EVERY NIGHT AT BEDTIME (Patient taking differently: Take 300 mg by mouth at bedtime.) 30 tablet 3   atorvastatin (LIPITOR) 80 MG tablet TAKE 1 TABLET (80 MG TOTAL) BY MOUTH AT BEDTIME. 30 tablet 5   B Complex-C-Zn-Folic Acid (DIALYVITE 063-KZSW 15) 0.8 MG TABS Take 1 tablet by mouth daily.     cetirizine (ZYRTEC) 10 MG tablet Take 10 mg by mouth daily as needed for allergies.     cinacalcet (SENSIPAR) 30 MG tablet Take 30 mg by mouth daily with supper.      ELIQUIS 5 MG TABS tablet TAKE 1 TABLET (5 MG TOTAL) BY MOUTH 2 (TWO) TIMES DAILY. 60 tablet 3   esomeprazole (NEXIUM) 40 MG capsule Take 1 capsule (40 mg total) by mouth 2 (two) times daily  before a meal. 30 capsule 1   ferric citrate (AURYXIA) 1 GM 210 MG(Fe) tablet Take 210 mg by mouth 3 (three) times daily with meals.     furosemide (LASIX) 40 MG tablet Take 80 mg by mouth See admin instructions. Take 2 tablets (80 mg) by mouth twice daily on non-dialysis days - Sunday, Tuesday, Thursday     gabapentin (NEURONTIN) 100 MG capsule Take 1 capsule (100 mg total) by mouth 3 (three) times daily. 90 capsule 1   lidocaine-prilocaine (EMLA) cream Apply 1 application topically See admin instructions. Apply topically to port access one hour prior to dialysis - Monday, Wednesday, Friday, Saturday     metoprolol succinate (TOPROL-XL) 25 MG 24 hr tablet TAKE 1 TABLET BY MOUTH EVERY  MORNING & TAKE 1 TABLET EVERY NIGHT AT BEDTIME WITH OR IMMEDIATELY FOLLOWING MEAL (Patient taking differently: Take 25 mg by mouth in the morning and at bedtime.) 60 tablet 11   midodrine (PROAMATINE) 10 MG tablet Take 1 tablet (10 mg total) by mouth daily. 30 tablet 0   montelukast (SINGULAIR) 10 MG tablet Take 1 tablet by mouth once daily (Patient taking differently: Take 10 mg by mouth daily as needed (allergies).) 90 tablet 0   Multiple Vitamins-Minerals (MULTIVITAMIN WITH MINERALS) tablet Take 1 tablet by mouth daily.     Nutritional Supplements (,FEEDING SUPPLEMENT, PROSOURCE PLUS) liquid Take 30 mLs by mouth 2 (two) times daily between meals.     triamcinolone 0.1% oint-Eucerin equivalent cream 1:1 mixture Apply topically 3 (three) times daily as needed. APPLY TO DRY AFFECTED AREAS AS NEEDED 3 TIMES ADAY 360 g 1   vitamin B-12 (CYANOCOBALAMIN) 1000 MCG tablet Take 1,000 mcg by mouth daily.     vitamin C (VITAMIN C) 500 MG tablet Take 1 tablet (500 mg total) by mouth daily. 30 tablet 0   No current facility-administered medications on file prior to visit.    Observations/Objective: There were no vitals taken for this visit. Pain all over   Assessment and Plan: Diagnoses and all orders for this visit:  Physical deconditioning  Needs assistance with ADL , transfers, bathing, cooking, assistance with gerring out of bed. Discussed with family other options refer for [facility] [place]  Follow Up Instructions:   I discussed the assessment and treatment plan with the patient. The patient was provided an opportunity to ask questions and all were answered. The patient agreed with the plan and demonstrated an understanding of the instructions.   The patient was advised to call back or seek an in-person evaluation if the symptoms worsen or if the condition fails to improve as anticipated.     I provided 10 minutes total of non-face-to-face time during this encounter including median intraservice  time, reviewing previous notes, investigations, ordering medications, medical decision making, coordinating care and patient verbalized understanding at the end of the visit.    This note has been created with Surveyor, quantity. Any transcriptional errors are unintentional.   [name], NP 07/17/2021, 10:28 AM

## 2021-07-17 NOTE — ED Triage Notes (Signed)
Pt  here from [place] office sent over for possible admission for pain mgmt for possible  [facility] mets

## 2021-07-17 NOTE — Telephone Encounter (Signed)
   Notes to clinic:  Patient states had appt today  Review for refill    Requested Prescriptions  Pending Prescriptions Disp Refills   allopurinol (ZYLOPRIM) 300 MG tablet [Pharmacy Med Name: Allopurinol 300MG  TABS] 30 tablet 3    Sig: TAKE 1 TABLET BY MOUTH EVERY NIGHT AT BEDTIME     Endocrinology:  Gout Agents Failed - 07/17/2021  1:46 PM      Failed - Uric Acid in normal range and within 360 days    No results found for: POCURA, LABURIC        Failed - Cr in normal range and within 360 days    Creatinine  Date Value Ref Range Status  04/12/2021 5.57 (HH) 0.44 - 1.00 mg/dL Final    Comment:    CRITICAL RESULT CALLED TO, READ BACK BY AND VERIFIED WITH: beth tracey at 1110 by hflynt 04/12/21    Creatinine, Ser  Date Value Ref Range Status  07/09/2021 6.57 (H) 0.44 - 1.00 mg/dL Final   Creatinine, Urine  Date Value Ref Range Status  10/12/2019 59.39 mg/dL Final          Passed - Valid encounter within last 12 months    Recent Outpatient Visits           2 months ago Screening for diabetes mellitus   [place], Michelle P, NP   7 months ago Hospital discharge follow-up   [place], [place], NP   1 year ago Need for Tdap vaccination   [place] [name], NP   1 year ago Need for Tdap vaccination   [place] [name], NP   1 year ago Encounter to establish care   [place] [name], NP       Future Appointments             Today Erin Fulling Cheryle Horsfall, MD [facility]

## 2021-07-17 NOTE — ED Provider Notes (Signed)
Emergency Medicine Provider Triage Evaluation Note  Melissa Adams , a 75 y.o. female  was evaluated in triage.  Pt complains of sent from pulmonology.  I personally spoke with her pulmonologist Dr. Erin Adams prior to patient's arrival.  Patient has had worsening, ongoing uncontrolled pain which is felt to be secondary to pathologic L4 fracture.  Patient was given a prescription for Norco, and she states that that occasionally takes the edge off but her pain is still uncontrolled and is having mobility issues.  She is ESRD on HD.  She denies any fevers.  Dr. August Adams suggestion when I spoke with him was for medicine to admit for pain management, continue dialysis, getting a clear plan from oncology.  Review of Systems  Positive: Worsening back pain, inability to ambulate secondary to pain. Negative: Obvious changes to bowel/bladder function, patient does not make urine  Physical Exam  BP 115/63 (BP Location: Right Arm)   Pulse (!) 111   Temp 99.3 F (37.4 C) (Oral)   Resp 18   SpO2 94%  Gen:   Awake, appears uncomfortable Resp:  Normal effort  MSK:   Moves extremities without difficulty  Other:  Patient appears uncomfortable.  Speech is nonslurred.  Medical Decision Making  Medically screening exam initiated at 6:53 PM.  Appropriate orders placed.  Melissa Adams was informed that the remainder of the evaluation will be completed by another provider, this initial triage assessment does not replace that evaluation, and the importance of remaining in the ED until their evaluation is complete.  Note: Portions of this report may have been transcribed using voice recognition software. Every effort was made to ensure accuracy; however, inadvertent computerized transcription errors may be present    Ollen Gross 07/17/21 Morey Hummingbird, MD 07/18/21 [phone]

## 2021-07-18 ENCOUNTER — Observation Stay (HOSPITAL_COMMUNITY): Payer: Medicare Other

## 2021-07-18 ENCOUNTER — Telehealth: Payer: Self-pay | Admitting: *Deleted

## 2021-07-18 DIAGNOSIS — N186 End stage renal disease: Secondary | ICD-10-CM

## 2021-07-18 DIAGNOSIS — C229 Malignant neoplasm of liver, not specified as primary or secondary: Secondary | ICD-10-CM

## 2021-07-18 DIAGNOSIS — M4856XA Collapsed vertebra, not elsewhere classified, lumbar region, initial encounter for fracture: Secondary | ICD-10-CM | POA: Diagnosis not present

## 2021-07-18 DIAGNOSIS — Z992 Dependence on renal dialysis: Secondary | ICD-10-CM

## 2021-07-18 DIAGNOSIS — K7460 Unspecified cirrhosis of liver: Secondary | ICD-10-CM

## 2021-07-18 DIAGNOSIS — R188 Other ascites: Secondary | ICD-10-CM

## 2021-07-18 DIAGNOSIS — I482 Chronic atrial fibrillation, unspecified: Secondary | ICD-10-CM

## 2021-07-18 DIAGNOSIS — R531 Weakness: Secondary | ICD-10-CM

## 2021-07-18 DIAGNOSIS — M545 Low back pain, unspecified: Secondary | ICD-10-CM | POA: Diagnosis not present

## 2021-07-18 DIAGNOSIS — C787 Secondary malignant neoplasm of liver and intrahepatic bile duct: Secondary | ICD-10-CM

## 2021-07-18 DIAGNOSIS — I5022 Chronic systolic (congestive) heart failure: Secondary | ICD-10-CM

## 2021-07-18 MED ORDER — METOPROLOL SUCCINATE ER 25 MG PO TB24
25.0000 mg | ORAL_TABLET | Freq: Two times a day (BID) | ORAL | Status: DC
  Administered 2021-07-18: 25 mg via ORAL
  Filled 2021-07-18 (×3): qty 1

## 2021-07-18 MED ORDER — FENTANYL CITRATE PF 50 MCG/ML IJ SOSY: 12.5000 ug | PREFILLED_SYRINGE | INTRAMUSCULAR | Status: DC | PRN

## 2021-07-18 MED ORDER — ACETAMINOPHEN 325 MG PO TABS
650.0000 mg | ORAL_TABLET | Freq: Four times a day (QID) | ORAL | Status: DC | PRN
  Administered 2021-07-22 – 2021-07-30 (×4): 650 mg via ORAL
  Filled 2021-07-18 (×4): qty 2

## 2021-07-18 MED ORDER — FUROSEMIDE 40 MG PO TABS
160.0000 mg | ORAL_TABLET | Freq: Two times a day (BID) | ORAL | Status: DC
  Administered 2021-07-18 – 2021-07-19 (×3): 160 mg via ORAL
  Filled 2021-07-18 (×3): qty 4
  Filled 2021-07-18: qty 8

## 2021-07-18 MED ORDER — RENA-VITE PO TABS
1.0000 | ORAL_TABLET | Freq: Every day | ORAL | Status: DC
  Administered 2021-07-18 – 2021-07-31 (×14): 1 via ORAL
  Filled 2021-07-18 (×15): qty 1

## 2021-07-18 MED ORDER — CINACALCET HCL 30 MG PO TABS
30.0000 mg | ORAL_TABLET | Freq: Every day | ORAL | Status: DC
  Administered 2021-07-18 – 2021-07-31 (×14): 30 mg via ORAL
  Filled 2021-07-18 (×14): qty 1

## 2021-07-18 MED ORDER — ATORVASTATIN CALCIUM 80 MG PO TABS
80.0000 mg | ORAL_TABLET | Freq: Every evening | ORAL | Status: DC
  Administered 2021-07-18 – 2021-07-31 (×14): 80 mg via ORAL
  Filled 2021-07-18 (×14): qty 1

## 2021-07-18 MED ORDER — DOXERCALCIFEROL 4 MCG/2ML IV SOLN
2.0000 ug | INTRAVENOUS | Status: DC
  Filled 2021-07-18 (×2): qty 2

## 2021-07-18 MED ORDER — HYDROMORPHONE HCL 1 MG/ML IJ SOLN
1.0000 mg | INTRAMUSCULAR | Status: DC | PRN
  Administered 2021-07-19 – 2021-07-22 (×3): 1 mg via INTRAVENOUS
  Filled 2021-07-18 (×3): qty 1

## 2021-07-18 MED ORDER — LORAZEPAM 1 MG PO TABS
1.0000 mg | ORAL_TABLET | Freq: Once | ORAL | Status: AC
  Administered 2021-07-18: 1 mg via ORAL
  Filled 2021-07-18: qty 1

## 2021-07-18 MED ORDER — PROSOURCE PLUS PO LIQD
30.0000 mL | Freq: Two times a day (BID) | ORAL | Status: DC
  Administered 2021-07-19 – 2021-07-31 (×19): 30 mL via ORAL
  Filled 2021-07-18 (×21): qty 30

## 2021-07-18 MED ORDER — ALBUTEROL SULFATE (2.5 MG/3ML) 0.083% IN NEBU: 2.5000 mg | INHALATION_SOLUTION | Freq: Four times a day (QID) | RESPIRATORY_TRACT | Status: DC | PRN

## 2021-07-18 MED ORDER — FUROSEMIDE 20 MG PO TABS: 80.0000 mg | ORAL_TABLET | ORAL | Status: DC

## 2021-07-18 MED ORDER — MIDODRINE HCL 5 MG PO TABS
10.0000 mg | ORAL_TABLET | Freq: Every day | ORAL | Status: DC
  Administered 2021-07-19 – 2021-07-31 (×12): 10 mg via ORAL
  Filled 2021-07-18 (×13): qty 2

## 2021-07-18 MED ORDER — ONDANSETRON HCL 4 MG/2ML IJ SOLN: 4.0000 mg | Freq: Four times a day (QID) | INTRAMUSCULAR | Status: DC | PRN

## 2021-07-18 MED ORDER — ACETAMINOPHEN 650 MG RE SUPP: 650.0000 mg | Freq: Four times a day (QID) | RECTAL | Status: DC | PRN

## 2021-07-18 MED ORDER — FERROUS SULFATE 325 (65 FE) MG PO TABS
324.0000 mg | ORAL_TABLET | Freq: Every day | ORAL | Status: DC
  Administered 2021-07-19: 324 mg via ORAL
  Filled 2021-07-18 (×2): qty 1

## 2021-07-18 MED ORDER — FERRIC CITRATE 1 GM 210 MG(FE) PO TABS
420.0000 mg | ORAL_TABLET | Freq: Three times a day (TID) | ORAL | Status: DC
  Administered 2021-07-18 – 2021-07-31 (×33): 420 mg via ORAL
  Filled 2021-07-18 (×42): qty 2

## 2021-07-18 MED ORDER — FENTANYL CITRATE PF 50 MCG/ML IJ SOSY
50.0000 ug | PREFILLED_SYRINGE | Freq: Once | INTRAMUSCULAR | Status: AC
Start: 2021-07-18 — End: 2021-07-18
  Administered 2021-07-18: 50 ug via INTRAVENOUS
  Filled 2021-07-18: qty 1

## 2021-07-18 MED ORDER — VITAMIN B-12 1000 MCG PO TABS
1000.0000 ug | ORAL_TABLET | Freq: Every day | ORAL | Status: DC
  Administered 2021-07-19 – 2021-07-31 (×10): 1000 ug via ORAL
  Filled 2021-07-18 (×10): qty 1

## 2021-07-18 MED ORDER — ALLOPURINOL 300 MG PO TABS
300.0000 mg | ORAL_TABLET | Freq: Every day | ORAL | Status: DC
  Administered 2021-07-18 – 2021-07-31 (×14): 300 mg via ORAL
  Filled 2021-07-18 (×14): qty 1

## 2021-07-18 MED ORDER — GABAPENTIN 100 MG PO CAPS
100.0000 mg | ORAL_CAPSULE | Freq: Three times a day (TID) | ORAL | Status: DC
Start: 2021-07-18 — End: 2021-08-01
  Administered 2021-07-18 – 2021-07-31 (×36): 100 mg via ORAL
  Filled 2021-07-18 (×39): qty 1

## 2021-07-18 MED ORDER — APIXABAN 5 MG PO TABS
5.0000 mg | ORAL_TABLET | Freq: Two times a day (BID) | ORAL | Status: DC
  Administered 2021-07-18 – 2021-07-19 (×2): 5 mg via ORAL
  Filled 2021-07-18 (×2): qty 1

## 2021-07-18 MED ORDER — ONDANSETRON HCL 4 MG PO TABS: 4.0000 mg | ORAL_TABLET | Freq: Four times a day (QID) | ORAL | Status: DC | PRN

## 2021-07-18 MED ORDER — LORAZEPAM 1 MG PO TABS: 1.0000 mg | ORAL_TABLET | Freq: Once | ORAL | Status: DC | PRN

## 2021-07-18 NOTE — Telephone Encounter (Signed)
Sent to PCP to refill if appropriate.  

## 2021-07-18 NOTE — Progress Notes (Signed)
D/w oncologist Dr. Lorenso Courier.  Recommended to involve radiation oncology for potential benefit of radiation at the fracture site.  I called and discussed with radiation oncologist Dr. Sondra Come.  As he suggested, I called IR to see if patient is a candidate for vertebroplasty. Discussed with IR Dr. Kathlene Cote.  Patient to be seen as a formal consult and consideration of vertebroplasty.

## 2021-07-18 NOTE — ED Provider Notes (Signed)
[facility] EMERGENCY DEPARTMENT Provider Note   CSN: [id number] Arrival date & time: 07/17/21  1755     History No chief complaint on file.   Melissa Adams is a 75 y.o. female presents to the emergency department directly from her pulmonology office for admission.  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  She was evaluated at her pulmonologist this morning for worsening right-sided pleural effusion.  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  Patient has been taking Tylenol and pain medication at home without relief.  She is unable to walk secondary to her severe pain and she reports that 1 week ago while at dialysis her legs gave out and she has not been able to walk since that time.  Patient denies numbness in her legs, loss of bowel or bladder control.  Per pulmonology note, Dr. Erin Fulling sent patient to the emergency department for admission for pain control, generalized weakness and oncology work-up.  The history is provided by the patient and medical records. No language interpreter was used.      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   Cardiomyopathy ([place])    CHF (congestive heart failure) ([place])    Chronic kidney disease    Edema    History of hemodialysis    Hyperlipidemia    Hypertension    Hypotension    on midodrine (as of 06/15/21)   Pulmonary hypertension (HCC)    Recurrent right pleural effusion    As of 06/15/21: s/p thoracenteses 05/05/20, 06/11/20, 08/17/20, 11/18/20, 04/06/21   Sleep apnea    Tricuspid regurgitation     Patient Active  Problem List   Diagnosis Date Noted   Abnormal findings on esophagogastroduodenoscopy (EGD) 01/27/2021   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   Benign carcinoid tumor of duodenum    Benign neoplasm of colon    Cirrhosis of liver with ascites ([place])    Melena    GI bleed 11/13/2020   Upper GI bleeding 62/83/1517   Chronic systolic CHF (congestive heart failure) ([place]) 11/13/2020   Acute blood loss anemia 11/13/2020   Chronic anticoagulation 11/13/2020   Pleural effusion 06/11/2020   Aortic atherosclerosis ([place]) 06/11/2020   ESRD on dialysis ([place]) 01/11/2020   Acute on chronic systolic heart failure (HCC)    Pressure injury of skin 10/22/2019   Dyspnea    COVID-19 virus infection    CHF exacerbation ([place]) 10/12/2019   Acute CHF (congestive heart failure) ([place]) 10/11/2019   Atrial fibrillation, chronic ([place]) 10/11/2019   Essential hypertension 10/11/2019   Sleep apnea 10/11/2019   AKI (acute kidney injury) ([place]) 10/11/2019   Gout 10/11/2019   Anemia 10/11/2019    Past Surgical History:  Procedure Laterality Date   A/V FISTULAGRAM Left 05/23/2020   Procedure: A/V FISTULAGRAM;  Surgeon: [name], MD;  Location: [place] [facility];  Service: Cardiovascular;  Laterality: Left;   A/V FISTULAGRAM N/A 12/21/2020   Procedure: A/V FISTULAGRAM - Left Upper Arm;  Surgeon: [name], MD;  Location: [place] [facility];  Service: Cardiovascular;  Laterality: N/A;   ABDOMINAL HYSTERECTOMY  AV FISTULA PLACEMENT Left 11/04/2019   Procedure: Insertion Of Arteriovenous (Av) Gore-Tex Graft Arm;  Surgeon: [name], MD;  Location: [place];  Service: Vascular;  Laterality: Left;   BIOPSY  11/14/2020   Procedure: BIOPSY;  Surgeon: Yetta Flock, MD;  Location: [place];  Service: Gastroenterology;;   BIOPSY  11/16/2020   Procedure: BIOPSY;  Surgeon: Yetta Flock, MD;  Location: [place];  Service: Gastroenterology;;   BIOPSY   02/22/2021   Procedure: BIOPSY;  Surgeon: Irving Copas., MD;  Location: [facility] ENDOSCOPY;  Service: Gastroenterology;;   CARDIAC CATHETERIZATION     CARDIOVERSION N/A 10/26/2019   Procedure: CARDIOVERSION;  Surgeon: [name], MD;  Location: [facility] ENDOSCOPY;  Service: Cardiovascular;  Laterality: N/A;   COLONOSCOPY W/ BIOPSIES AND POLYPECTOMY     COLONOSCOPY WITH PROPOFOL N/A 11/16/2020   Procedure: COLONOSCOPY WITH PROPOFOL;  Surgeon: Yetta Flock, MD;  Location: [place];  Service: Gastroenterology;  Laterality: N/A;   ENDOSCOPIC MUCOSAL RESECTION N/A 02/22/2021   Procedure: ENDOSCOPIC MUCOSAL RESECTION;  Surgeon: Rush Landmark Telford Nab., MD;  Location: [place];  Service: Gastroenterology;  Laterality: N/A;   ESOPHAGOGASTRODUODENOSCOPY (EGD) WITH PROPOFOL Left 11/14/2020   Procedure: ESOPHAGOGASTRODUODENOSCOPY (EGD) WITH PROPOFOL;  Surgeon: Yetta Flock, MD;  Location: [place];  Service: Gastroenterology;  Laterality: Left;   ESOPHAGOGASTRODUODENOSCOPY (EGD) WITH PROPOFOL N/A 02/22/2021   Procedure: ESOPHAGOGASTRODUODENOSCOPY (EGD) WITH PROPOFOL;  Surgeon: Rush Landmark Telford Nab., MD;  Location: [place];  Service: Gastroenterology;  Laterality: N/A;   FISTULA SUPERFICIALIZATION Left 01/21/2020   Procedure: FISTULA SUPERFICIALIZATION OF FISTULA;  Surgeon: [name], MD;  Location: [place];  Service: Vascular;  Laterality: Left;   FISTULA SUPERFICIALIZATION Left 03/29/6567   Procedure: PLICATION OF LEFT UPPER EXTREMITY ARTERIOVENOUS FISTULA;  Surgeon: Waynetta [name], MD;  Location: [place];  Service: Vascular;  Laterality: Left;   GIVENS CAPSULE STUDY N/A 11/16/2020   Procedure: GIVENS CAPSULE STUDY;  Surgeon: Yetta Flock, MD;  Location: [place];  Service: Gastroenterology;  Laterality: N/A;   HEMOSTASIS CLIP PLACEMENT  02/22/2021   Procedure: HEMOSTASIS CLIP PLACEMENT;  Surgeon: Rush Landmark Telford Nab., MD;  Location: [place];  Service: Gastroenterology;;   INSERTION OF DIALYSIS CATHETER Right 11/04/2019   Procedure: CONVERT TEMPORARY DIALYSIS CATHETER TO TUNNELED DIALYSIS CATHETER Right Internal Jugular.;  Surgeon: [name], MD;  Location: [place];  Service: Vascular;  Laterality: Right;   IR THORACENTESIS ASP PLEURAL SPACE W/IMG GUIDE  08/17/2020   MULTIPLE TOOTH EXTRACTIONS     POLYPECTOMY  11/16/2020   Procedure: POLYPECTOMY;  Surgeon: Yetta Flock, MD;  Location: [place];  Service: Gastroenterology;;   RADIOLOGY WITH ANESTHESIA N/A 06/19/2021   Procedure: MRI WITH ANESTHESIA  ,LIVER WITH AND WITHOUT CONTRAST;  Surgeon: Radiologist, Medication, MD;  Location: [place];  Service: Radiology;  Laterality: N/A;   REVISION OF ARTERIOVENOUS GORETEX GRAFT Left 01/21/2020   Procedure: REVISION OF ARTERIOVENOUS FISTULA WITH SIDE BRANCH LIGATION;  Surgeon: [name], MD;  Location: [place];  Service: Vascular;  Laterality: Left;   SUBMUCOSAL LIFTING INJECTION  02/22/2021   Procedure: SUBMUCOSAL LIFTING INJECTION;  Surgeon: Irving Copas., MD;  Location: [place];  Service: Gastroenterology;;   SUBMUCOSAL TATTOO INJECTION  02/22/2021   Procedure: SUBMUCOSAL TATTOO INJECTION;  Surgeon: Irving Copas., MD;  Location: [place];  Service: Gastroenterology;;   TEE WITHOUT CARDIOVERSION N/A 10/26/2019   Procedure: TRANSESOPHAGEAL ECHOCARDIOGRAM (TEE);  Surgeon: [name], MD;  Location: [facility] ENDOSCOPY;  Service:  Cardiovascular;  Laterality: N/A;   THORACENTESIS     x 2   THORACENTESIS N/A 11/07/2020   Procedure: Mathews Robinsons;  Surgeon: [name], MD;  Location: [facility] ENDOSCOPY;  Service: Pulmonary;  Laterality: N/A;   THORACENTESIS N/A 04/06/2021   Procedure: THORACENTESIS;  Surgeon: Freddi Starr, MD;  Location: [facility] ENDOSCOPY;  Service: Pulmonary;  Laterality: N/A;   UPPER ESOPHAGEAL ENDOSCOPIC ULTRASOUND (EUS) N/A 02/22/2021   Procedure: UPPER  ESOPHAGEAL ENDOSCOPIC ULTRASOUND (EUS);  Surgeon: Irving Copas., MD;  Location: [place];  Service: Gastroenterology;  Laterality: N/A;     OB History   No obstetric history on file.     Family History  Problem Relation Age of Onset   Seizures Father    CAD Father    Diabetes Sister    Lupus Sister    Hypertension Sister    Colon cancer Neg Hx    Esophageal cancer Neg Hx    Pancreatic cancer Neg Hx    Stomach cancer Neg Hx    Inflammatory bowel disease Neg Hx    Liver disease Neg Hx    Rectal cancer Neg Hx     Social History   Tobacco Use   Smoking status: Former    Types: Cigarettes   Smokeless tobacco: Never  Vaping Use   Vaping Use: Never used  Substance Use Topics   Alcohol use: Not Currently   Drug use: Not Currently    Home Medications Prior to Admission medications   Medication Sig Start Date End Date Taking? Authorizing Provider  acetaminophen (TYLENOL) 325 MG tablet Take 2 tablets (650 mg total) by mouth every 4 (four) hours as needed for headache or mild pain. 11/05/19   Kinnie Feil, MD  albuterol (VENTOLIN HFA) 108 (90 Base) MCG/ACT inhaler Inhale 2 puffs into the lungs every 6 (six) hours as needed for wheezing or shortness of breath. 11/30/19   [name], NP  allopurinol (ZYLOPRIM) 300 MG tablet TAKE 1 TABLET BY MOUTH EVERY NIGHT AT BEDTIME Patient taking differently: Take 300 mg by mouth at bedtime. 03/23/21   [name], NP  atorvastatin (LIPITOR) 80 MG tablet TAKE 1 TABLET (80 MG TOTAL) BY MOUTH AT BEDTIME. 02/26/21   [name], MD  B Complex-C-Zn-Folic Acid (DIALYVITE 696-EXBM 15) 0.8 MG TABS Take 1 tablet by mouth daily. 01/10/21   [provider]  cetirizine (ZYRTEC) 10 MG tablet Take 10 mg by mouth daily as needed for allergies.    [provider]  cinacalcet (SENSIPAR) 30 MG tablet Take 30 mg by mouth daily with supper.  04/18/20   [provider]  ELIQUIS 5 MG TABS tablet TAKE 1  TABLET (5 MG TOTAL) BY MOUTH 2 (TWO) TIMES DAILY. 04/20/21   [name], MD  esomeprazole (NEXIUM) 40 MG capsule Take 1 capsule (40 mg total) by mouth 2 (two) times daily before a meal. 02/22/21 02/22/22  Mansouraty, Telford Nab., MD  ferric citrate (AURYXIA) 1 GM 210 MG(Fe) tablet Take 210 mg by mouth 3 (three) times daily with meals.    [provider]  furosemide (LASIX) 40 MG tablet Take 80 mg by mouth See admin instructions. Take 2 tablets (80 mg) by mouth twice daily on non-dialysis days - Sunday, Tuesday, Thursday    [provider]  gabapentin (NEURONTIN) 100 MG capsule Take 1 capsule (100 mg total) by mouth 3 (three) times daily. 04/24/21   [name], NP  lidocaine-prilocaine (EMLA) cream Apply 1 application  topically See admin instructions. Apply topically to port access one hour prior to dialysis - Monday, Wednesday, Friday, Saturday 04/07/20   [provider]  metoprolol succinate (TOPROL-XL) 25 MG 24 hr tablet TAKE 1 TABLET BY MOUTH EVERY MORNING & TAKE 1 TABLET EVERY NIGHT AT BEDTIME WITH OR IMMEDIATELY FOLLOWING MEAL Patient taking differently: Take 25 mg by mouth in the morning and at bedtime. 03/27/21   [name], MD  midodrine (PROAMATINE) 10 MG tablet Take 1 tablet (10 mg total) by mouth daily. 11/20/20   Pahwani, Michell Heinrich, MD  montelukast (SINGULAIR) 10 MG tablet Take 1 tablet by mouth once daily Patient taking differently: Take 10 mg by mouth daily as needed (allergies). 11/28/20   [name], NP  Multiple Vitamins-Minerals (MULTIVITAMIN WITH MINERALS) tablet Take 1 tablet by mouth daily.    [provider]  Nutritional Supplements (,FEEDING SUPPLEMENT, PROSOURCE PLUS) liquid Take 30 mLs by mouth 2 (two) times daily between meals. 11/19/20   Pahwani, Michell Heinrich, MD  triamcinolone 0.1% oint-Eucerin equivalent cream 1:1 mixture Apply topically 3 (three) times daily as needed. APPLY TO DRY AFFECTED AREAS AS NEEDED 3 TIMES ADAY  04/24/21   [name], NP  vitamin B-12 (CYANOCOBALAMIN) 1000 MCG tablet Take 1,000 mcg by mouth daily.    [provider]  vitamin C (VITAMIN C) 500 MG tablet Take 1 tablet (500 mg total) by mouth daily. 11/06/19   Kinnie Feil, MD    Allergies    Black walnut flavor and Shellfish allergy  Review of Systems   Review of Systems  Constitutional:  Positive for fever. Negative for appetite change, diaphoresis, fatigue and unexpected weight change.  HENT:  Negative for mouth sores.   Eyes:  Negative for visual disturbance.  Respiratory:  Positive for shortness of breath. Negative for cough, chest tightness and wheezing.   Cardiovascular:  Negative for chest pain.  Gastrointestinal:  Negative for abdominal pain, constipation, diarrhea, nausea and vomiting.  Endocrine: Negative for polydipsia, polyphagia and polyuria.  Genitourinary:  Negative for dysuria, frequency, hematuria and urgency.  Musculoskeletal:  Positive for back pain. Negative for neck stiffness.  Skin:  Negative for rash.  Allergic/Immunologic: Negative for immunocompromised state.  Neurological:  Positive for weakness (lower leg). Negative for syncope, light-headedness and headaches.  Hematological:  Does not bruise/bleed easily.  Psychiatric/Behavioral:  Negative for sleep disturbance. The patient is not nervous/anxious.    Physical Exam Updated Vital Signs BP 129/63   Pulse (!) 57   Temp 99.3 F (37.4 C) (Oral)   Resp 18   SpO2 97%   Physical Exam Vitals and nursing note reviewed.  Constitutional:      General: She is not in acute distress.    Appearance: She is not diaphoretic.  HENT:     Head: Normocephalic.  Eyes:     General: No scleral icterus.    Conjunctiva/sclera: Conjunctivae normal.  Cardiovascular:     Rate and Rhythm: Normal rate and regular rhythm.     Pulses: Normal pulses.          Radial pulses are 2+ on the right side and 2+ on the left side.     Comments:  JVD Pulmonary:     Effort: No tachypnea, accessory muscle usage, prolonged expiration, respiratory distress or retractions.     Breath sounds: No stridor.     Comments: Equal chest rise. No increased work of breathing. On oxygen at 3 L via nasal cannula Abdominal:  General: There is no distension.     Palpations: Abdomen is soft.     Tenderness: There is no abdominal tenderness. There is no guarding or rebound.  Musculoskeletal:     Cervical back: Normal range of motion.     Comments: Moves all extremities equally and without difficulty.  Skin:    General: Skin is warm and dry.     Capillary Refill: Capillary refill takes less than 2 seconds.  Neurological:     Mental Status: She is alert.     GCS: GCS eye subscore is 4. GCS verbal subscore is 5. GCS motor subscore is 6.     Comments: Speech is clear and goal oriented. Strength 4/5 in the BLE. Pt unable to walk 2/2 severe pain with movement.    Psychiatric:        Mood and Affect: Mood normal.    ED Results / Procedures / Treatments   Labs (all labs ordered are listed, but only abnormal results are displayed) Labs Reviewed  COMPREHENSIVE METABOLIC PANEL - Abnormal; Notable for the following components:      Result Value   Sodium 133 (*)    Chloride 95 (*)    BUN 29 (*)    Creatinine, Ser 5.81 (*)    AST 58 (*)    Alkaline Phosphatase 238 (*)    Total Bilirubin 1.5 (*)    GFR, Estimated 7 (*)    All other components within normal limits  CBC WITH DIFFERENTIAL/PLATELET - Abnormal; Notable for the following components:   RBC 3.72 (*)    Hemoglobin 11.3 (*)    RDW 17.3 (*)    All other components within normal limits  RESP PANEL BY RT-PCR (FLU A&B, COVID) [id number]     Radiology DG Chest 2 View  Result Date: 07/17/2021 CLINICAL DATA:  Shortness of breath.  Possible lesion. EXAM: CHEST - 2 VIEW COMPARISON:  04/06/2021 FINDINGS: Cardiac enlargement. No vascular congestion. Moderate right pleural effusion. There is right  middle lobe consolidation, likely pneumonia. This is progressing since the previous study. Left lung is clear. No pneumothorax. Calcified and tortuous aorta. Degenerative changes in the spine. IMPRESSION: Right middle lobe consolidation with moderate right pleural effusion. Electronically Signed   By: Lucienne Capers M.D.   On: 07/17/2021 19:29    Procedures Procedures   Medications Ordered in ED Medications  fentaNYL (SUBLIMAZE) injection 50 mcg (has no administration in time range)  oxyCODONE-acetaminophen (PERCOCET/ROXICET) 5-325 MG per tablet 1 tablet (1 tablet Oral Given 07/17/21 1905)    ED Course  I have reviewed the triage vital signs and the nursing notes.  Pertinent labs & imaging results that were available during my care of the patient were reviewed by me and considered in my medical decision making (see chart for details).    MDM Rules/Calculators/A&P                           Patient presents from pulmonary office for admission due to pain control, general deconditioning and ongoing cancer work-up.  3:32 AM Discussed with Dr. Alcario Drought who will admit.  Patient pending MRI for further evaluation of questionable tumor in the L-spine.  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

## 2021-07-18 NOTE — ED Notes (Signed)
Pt O2 88% increased nasal canula 4L. Pt at 91% Increased to 6L pt 99%.

## 2021-07-18 NOTE — ED Notes (Signed)
MRI called to see if pt was ready to return. Pt shook head no.

## 2021-07-18 NOTE — ED Notes (Signed)
Pt unable to finish MRI due to anxiety. Will make MD aware.

## 2021-07-18 NOTE — ED Notes (Signed)
Dr. Pietro Cassis stated to hold the dilaudid at the moment and to contact him if needed.

## 2021-07-18 NOTE — Progress Notes (Signed)
PROGRESS NOTE  Melissa Adams  DOB: 01-Apr-1946  PCP: [name], NP EUM:[id number]  DOA: 07/17/2021  LOS: 0 days  Hospital Day: 2   Chief complaint: Back pain  Brief narrative: Melissa Adams is a 75 y.o. 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 Patient follows up with oncologist Dr. Lorenso Adams.  She had an MRI of the abdomen under anesthesia done on 06/19/2021 which showed innumerable enhancing lesions throughout both lobes of liver concerning for metastatic disease. On 06/28/2021, she was seen by Dr. Lorenso Adams in the office and had a CT abdomen and pelvis done on 8/12.  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  She also had a small to moderate right pleural effusion for which she sees pulmonologist Dr. Erin Fulling.  She had a thoracentesis in May which did not show malignant cells and cytology. On 8/15, she underwent a liver biopsy that showed adenocarcinoma of unknown primary. On 8/23, patient went to Dr. Terrill Mohr office for follow-up.  She remains in excruciating pain in her lower back over the past few weeks because of which she has not been able to walk on her own. Because of the severity of her pain, she was sent to the ED for inpatient management. Chest x-ray showed right middle lobe consolidation with moderate right pleural effusion. Patient was admitted to hospitalist service for further evaluation management  Subjective: Patient was seen and examined this morning in the ED.  Patient was ordered inference of Ativan that was given to her earlier in preparation of MRI.  However, she was not completely sedated and could not do MRI.  It seems that she had an eye exam a month ago under  anesthesia.   Assessment/Plan: Intractable right-sided lower back pain Pathological fracture of L4 vertebrae due to metastatic disease -Patient unable to tolerate MRI of lumbar spine.  Will discuss with oncology to see if she needs any further imaging. -Continue pain control with IV Dilaudid and oral oxycodone..  Metastatic adenocarcinoma of unknown primary -Shown on liver biopsy from 8/15. -Defer to oncology for further work-up.  ESRD-HD-MWF -Nephrology consulted -Continue midodrine  Chronic systolic CHF -Last EF 30 to 35%.  Continue Toprol-XL (takes daily) and Lasix  A. fib  -Continue Toprol and Eliquis.  Mobility: PT eval after pain control Code Status:   Code Status: Full Code  Nutritional status: There is no height or weight on file to calculate BMI.     Diet:  Diet Order             Diet renal with fluid restriction Fluid restriction: 1200 mL Fluid; Room service appropriate? Yes; Fluid consistency: Thin  Diet effective now                  DVT prophylaxis:   apixaban (ELIQUIS) tablet 5 mg   Antimicrobials: None Fluid: None Consultants: Oncology called Family Communication: None at bedside  Status is: Inpatient  Remains inpatient appropriate because: Intractable pain  Dispo: The patient is from: Home              Anticipated d/c is to: Pending PT eval              Patient currently is not medically stable to d/c.  Difficult to place patient No     Infusions:    Scheduled Meds:  (feeding supplement) PROSource Plus  30 mL Oral BID BM   allopurinol  300 mg Oral QHS   apixaban  5 mg Oral BID   atorvastatin  80 mg Oral QPM   cinacalcet  30 mg Oral Q supper   ferric citrate  420 mg Oral TID WC   ferrous sulfate  324 mg Oral Daily   furosemide  160 mg Oral BID   gabapentin  100 mg Oral TID   metoprolol succinate  25 mg Oral BID   midodrine  10 mg Oral Daily   multivitamin  1 tablet Oral QHS   vitamin B-12  1,000 mcg Oral Daily     Antimicrobials: Anti-infectives (From admission, onward)    None       PRN meds: acetaminophen **OR** acetaminophen, albuterol, HYDROmorphone (DILAUDID) injection, LORazepam, ondansetron **OR** ondansetron (ZOFRAN) IV   Objective: Vitals:   07/18/21 0940 07/18/21 1250  BP: 129/63 111/72  Pulse: 77   Resp: 18   Temp:  97.7 F (36.5 C)  SpO2: 91% 92%   No intake or output data in the 24 hours ending 07/18/21 1438 There were no vitals filed for this visit. Weight change:  There is no height or weight on file to calculate BMI.   Physical Exam: General exam: Pleasant, elderly African-American female.  Not in distress at the time of my evaluation Skin: No rashes, lesions or ulcers. HEENT: Atraumatic, normocephalic, no obvious bleeding Lungs: Clear to auscultation bilaterally CVS: Regular rate and rhythm, no murmur GI/Abd soft, nontender, nondistended, bowel sound present CNS: Sedated with Ativan, open eyes on verbal command Psychiatry: Depressed look Extremities: No pedal edema, no calf tenderness  Data Review: I have personally reviewed the laboratory data and studies available.  Recent Labs  Lab 07/17/21 1847  WBC 8.7  NEUTROABS 6.9  HGB 11.3*  HCT 36.9  MCV 99.2  PLT 172   Recent Labs  Lab 07/17/21 1847  NA 133*  K 3.8  CL 95*  CO2 23  GLUCOSE 86  BUN 29*  CREATININE 5.81*  CALCIUM 9.6    F/u labs ordered Unresulted Labs (From admission, onward)    None       Signed, Terrilee Croak, MD [facility] 07/18/2021

## 2021-07-18 NOTE — ED Notes (Signed)
Attempted report x2. Secretary stated code stroke initiated on floor.

## 2021-07-18 NOTE — ED Notes (Signed)
Pt O2 89% with 3L nasal canula. Increased to 4L pt is at 91%

## 2021-07-18 NOTE — ED Notes (Signed)
[phone] daughter would like an update

## 2021-07-18 NOTE — Progress Notes (Signed)
Pts daughter called and would like the MD to call her and update her on her mother, she stated that she is healthcare POA and that her mom will get confused and not be able to tell her what the MD said. Her phone number is [phone].

## 2021-07-18 NOTE — Consult Note (Addendum)
[facility] Renal Consultation Note  Indication for Consultation:  Management of ESRD/hemodialysis; anemia, hypertension/volume and secondary hyperparathyroidism  HPI: Melissa Adams is a 75 y.o. female with ESRD HD MWF compliant, CHF/CM EF 3/35%, cardiogenic cirrhosis, A. fib on Toprol and Eliquis, hypotension on midodrine, duodenal neuroendocrine tumor resected March 2022 follow-up with oncology Dr. Lorenso Courier) recent diagnosis metastatic disease in both lobes of liver possible primary right breast carcinoma with with pulmonary and hepatic mets.  Right pleural effusion thoracentesis negative cells, also recent noted mild to moderate pathologic compression fracture L4.  Yesterday had follow-up with pulmonology had severe back pain and sent to the ED for admission for pain control.  Chest x-ray moderate pleural effusion on right , K3.8 BUN 29 creatinine 5.8, Hgb 11.3 on recent O2 sat nasal cannula 4 L  Currently on room pain is somewhat bearable with medicines, only complaint is cold.  Denies any shortness of breath or chest pain.      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   Cardiomyopathy ([place])    CHF (congestive heart failure) ([place])    Chronic kidney disease    Edema    History of hemodialysis    Hyperlipidemia    Hypertension    Hypotension    on midodrine (as of 06/15/21)   Pulmonary hypertension (HCC)    Recurrent right pleural effusion    As of 06/15/21: s/p thoracenteses 05/05/20, 06/11/20, 08/17/20, 11/18/20, 04/06/21   Sleep apnea    Tricuspid regurgitation     Past Surgical History:  Procedure Laterality Date   A/V FISTULAGRAM Left 05/23/2020   Procedure: A/V FISTULAGRAM;  Surgeon: [name], MD;  Location: [place] [facility];  Service: Cardiovascular;  Laterality: Left;   A/V FISTULAGRAM N/A 12/21/2020   Procedure: A/V FISTULAGRAM - Left Upper Arm;  Surgeon:  [name], MD;  Location: [place] [facility];  Service: Cardiovascular;  Laterality: N/A;   ABDOMINAL HYSTERECTOMY     AV FISTULA PLACEMENT Left 11/04/2019   Procedure: Insertion Of Arteriovenous (Av) Gore-Tex Graft Arm;  Surgeon: [name], MD;  Location: [place];  Service: Vascular;  Laterality: Left;   BIOPSY  11/14/2020   Procedure: BIOPSY;  Surgeon: Yetta Flock, MD;  Location: [place];  Service: Gastroenterology;;   BIOPSY  11/16/2020   Procedure: BIOPSY;  Surgeon: Yetta Flock, MD;  Location: [place];  Service: Gastroenterology;;   BIOPSY  02/22/2021   Procedure: BIOPSY;  Surgeon: Irving Copas., MD;  Location: [facility] ENDOSCOPY;  Service: Gastroenterology;;   CARDIAC CATHETERIZATION     CARDIOVERSION N/A 10/26/2019   Procedure: CARDIOVERSION;  Surgeon: [name], MD;  Location: [facility] ENDOSCOPY;  Service: Cardiovascular;  Laterality: N/A;   COLONOSCOPY W/ BIOPSIES AND POLYPECTOMY     COLONOSCOPY WITH PROPOFOL N/A 11/16/2020   Procedure: COLONOSCOPY WITH PROPOFOL;  Surgeon: Yetta Flock, MD;  Location: [place];  Service: Gastroenterology;  Laterality: N/A;   ENDOSCOPIC MUCOSAL RESECTION N/A 02/22/2021   Procedure: ENDOSCOPIC MUCOSAL RESECTION;  Surgeon: Rush Landmark Telford Nab., MD;  Location: [place];  Service: Gastroenterology;  Laterality: N/A;   ESOPHAGOGASTRODUODENOSCOPY (EGD) WITH PROPOFOL Left 11/14/2020   Procedure: ESOPHAGOGASTRODUODENOSCOPY (EGD) WITH PROPOFOL;  Surgeon: Yetta Flock, MD;  Location: [place];  Service: Gastroenterology;  Laterality: Left;   ESOPHAGOGASTRODUODENOSCOPY (EGD) WITH PROPOFOL N/A 02/22/2021   Procedure: ESOPHAGOGASTRODUODENOSCOPY (  EGD) WITH PROPOFOL;  Surgeon: Mansouraty, Telford Nab., MD;  Location: [place];  Service: Gastroenterology;  Laterality: N/A;   FISTULA SUPERFICIALIZATION Left 01/21/2020   Procedure: FISTULA SUPERFICIALIZATION OF FISTULA;  Surgeon: [name], MD;  Location: [place];  Service: Vascular;  Laterality: Left;   FISTULA SUPERFICIALIZATION Left 05/02/6719   Procedure: PLICATION OF LEFT UPPER EXTREMITY ARTERIOVENOUS FISTULA;  Surgeon: Waynetta [name], MD;  Location: [place];  Service: Vascular;  Laterality: Left;   GIVENS CAPSULE STUDY N/A 11/16/2020   Procedure: GIVENS CAPSULE STUDY;  Surgeon: Yetta Flock, MD;  Location: [place];  Service: Gastroenterology;  Laterality: N/A;   HEMOSTASIS CLIP PLACEMENT  02/22/2021   Procedure: HEMOSTASIS CLIP PLACEMENT;  Surgeon: Rush Landmark Telford Nab., MD;  Location: [place];  Service: Gastroenterology;;   INSERTION OF DIALYSIS CATHETER Right 11/04/2019   Procedure: CONVERT TEMPORARY DIALYSIS CATHETER TO TUNNELED DIALYSIS CATHETER Right Internal Jugular.;  Surgeon: [name], MD;  Location: [place];  Service: Vascular;  Laterality: Right;   IR THORACENTESIS ASP PLEURAL SPACE W/IMG GUIDE  08/17/2020   MULTIPLE TOOTH EXTRACTIONS     POLYPECTOMY  11/16/2020   Procedure: POLYPECTOMY;  Surgeon: Yetta Flock, MD;  Location: [place];  Service: Gastroenterology;;   RADIOLOGY WITH ANESTHESIA N/A 06/19/2021   Procedure: MRI WITH ANESTHESIA  ,LIVER WITH AND WITHOUT CONTRAST;  Surgeon: Radiologist, Medication, MD;  Location: [place];  Service: Radiology;  Laterality: N/A;   REVISION OF ARTERIOVENOUS GORETEX GRAFT Left 01/21/2020   Procedure: REVISION OF ARTERIOVENOUS FISTULA WITH SIDE BRANCH LIGATION;  Surgeon: [name], MD;  Location: [place];  Service: Vascular;  Laterality: Left;   SUBMUCOSAL LIFTING INJECTION  02/22/2021   Procedure: SUBMUCOSAL LIFTING INJECTION;  Surgeon: Irving Copas., MD;  Location: [place];  Service: Gastroenterology;;   SUBMUCOSAL TATTOO INJECTION  02/22/2021   Procedure: SUBMUCOSAL TATTOO INJECTION;  Surgeon: Irving Copas., MD;  Location: [place];  Service: Gastroenterology;;   TEE WITHOUT  CARDIOVERSION N/A 10/26/2019   Procedure: TRANSESOPHAGEAL ECHOCARDIOGRAM (TEE);  Surgeon: [name], MD;  Location: [facility] ENDOSCOPY;  Service: Cardiovascular;  Laterality: N/A;   THORACENTESIS     x 2   THORACENTESIS N/A 11/07/2020   Procedure: Mathews Robinsons;  Surgeon: [name], MD;  Location: [facility] ENDOSCOPY;  Service: Pulmonary;  Laterality: N/A;   THORACENTESIS N/A 04/06/2021   Procedure: THORACENTESIS;  Surgeon: Freddi Starr, MD;  Location: [facility] ENDOSCOPY;  Service: Pulmonary;  Laterality: N/A;   UPPER ESOPHAGEAL ENDOSCOPIC ULTRASOUND (EUS) N/A 02/22/2021   Procedure: UPPER ESOPHAGEAL ENDOSCOPIC ULTRASOUND (EUS);  Surgeon: Irving Copas., MD;  Location: [place];  Service: Gastroenterology;  Laterality: N/A;      Family History  Problem Relation Age of Onset   Seizures Father    CAD Father    Diabetes Sister    Lupus Sister    Hypertension Sister    Colon cancer Neg Hx    Esophageal cancer Neg Hx    Pancreatic cancer Neg Hx    Stomach cancer Neg Hx    Inflammatory bowel disease Neg Hx    Liver disease Neg Hx    Rectal cancer Neg Hx       reports that she has quit smoking. Her smoking use included cigarettes. She has never used smokeless tobacco. She reports that she does not currently use alcohol. She reports that she does not currently use drugs.   Allergies  Allergen Reactions   [facility]  Shellfish Allergy Itching    Makes throat itch     Prior to Admission medications   Medication Sig Start Date End Date Taking? Authorizing Provider  acetaminophen (TYLENOL) 325 MG tablet Take 2 tablets (650 mg total) by mouth every 4 (four) hours as needed for headache or mild pain. 11/05/19  Yes Buriev, Arie Sabina, MD  albuterol (VENTOLIN HFA) 108 (90 Base) MCG/ACT inhaler Inhale 2 puffs into the lungs every 6 (six) hours as needed for wheezing or shortness of breath. 11/30/19  Yes [name], NP  allopurinol (ZYLOPRIM) 300 MG tablet  TAKE 1 TABLET BY MOUTH EVERY NIGHT AT BEDTIME Patient taking differently: Take 300 mg by mouth at bedtime. 03/23/21  Yes [name], NP  atorvastatin (LIPITOR) 80 MG tablet TAKE 1 TABLET (80 MG TOTAL) BY MOUTH AT BEDTIME. Patient taking differently: Take 80 mg by mouth every evening. 02/26/21  Yes [name], MD  B Complex-C-Zn-Folic Acid (DIALYVITE 756-EPPI 15) 0.8 MG TABS Take 1 tablet by mouth daily. 01/10/21  Yes [provider]  cetirizine (ZYRTEC) 10 MG tablet Take 10 mg by mouth daily as needed for allergies.   Yes [provider]  cinacalcet (SENSIPAR) 30 MG tablet Take 30 mg by mouth daily with supper.  04/18/20  Yes [provider]  ELIQUIS 5 MG TABS tablet TAKE 1 TABLET (5 MG TOTAL) BY MOUTH 2 (TWO) TIMES DAILY. Patient taking differently: Take 5 mg by mouth 2 (two) times daily. 04/20/21  Yes [name], MD  ferric citrate (AURYXIA) 1 GM 210 MG(Fe) tablet Take 420 mg by mouth 3 (three) times daily with meals.   Yes [provider]  ferrous sulfate 324 MG TBEC Take 324 mg by mouth daily.   Yes [provider]  furosemide (LASIX) 80 MG tablet Take 160 mg by mouth 2 (two) times daily. 06/19/21  Yes [provider]  gabapentin (NEURONTIN) 100 MG capsule Take 1 capsule (100 mg total) by mouth 3 (three) times daily. 04/24/21  Yes [name], NP  lidocaine-prilocaine (EMLA) cream Apply 1 application topically See admin instructions. Apply topically to port access one hour prior to dialysis - Monday, Wednesday, Friday, Saturday 04/07/20  Yes [provider]  metoprolol succinate (TOPROL-XL) 25 MG 24 hr tablet TAKE 1 TABLET BY MOUTH EVERY MORNING & TAKE 1 TABLET EVERY NIGHT AT BEDTIME WITH OR IMMEDIATELY FOLLOWING MEAL Patient taking differently: Take 25 mg by mouth in the morning and at bedtime. 03/27/21  Yes [name], MD  midodrine (PROAMATINE) 10 MG tablet Take 1 tablet (10 mg total) by mouth daily.  11/20/20  Yes Pahwani, Michell Heinrich, MD  Multiple Vitamins-Minerals (MULTIVITAMIN WITH MINERALS) tablet Take 1 tablet by mouth daily.   Yes [provider]  Nutritional Supplements (,FEEDING SUPPLEMENT, PROSOURCE PLUS) liquid Take 30 mLs by mouth 2 (two) times daily between meals. 11/19/20  Yes Pahwani, Rinka R, MD  triamcinolone 0.1% oint-Eucerin equivalent cream 1:1 mixture Apply topically 3 (three) times daily as needed. APPLY TO DRY AFFECTED AREAS AS NEEDED 3 TIMES ADAY Patient taking differently: Apply 1 application topically 3 (three) times daily as needed for irritation, itching or rash. 04/24/21  Yes [name], NP  vitamin B-12 (CYANOCOBALAMIN) 1000 MCG tablet Take 1,000 mcg by mouth daily.   Yes [provider]  vitamin C (VITAMIN C) 500 MG tablet Take 1 tablet (500 mg total) by mouth daily. 11/06/19  Yes Kinnie Feil, MD  Results for orders placed or performed during the hospital encounter of 07/17/21 (from the past 48 hour(s))  Comprehensive metabolic panel     Status: Abnormal   Collection Time: 07/17/21  6:47 PM  Result Value Ref Range   Sodium 133 (L) 135 - 145 mmol/L   Potassium 3.8 3.5 - 5.1 mmol/L   Chloride 95 (L) 98 - 111 mmol/L   CO2 23 22 - 32 mmol/L   Glucose, Bld 86 70 - 99 mg/dL    Comment: Glucose reference range applies only to samples taken after fasting for at least 8 hours.   BUN 29 (H) 8 - 23 mg/dL   Creatinine, Ser 5.81 (H) 0.44 - 1.00 mg/dL   Calcium 9.6 8.9 - 10.3 mg/dL   Total Protein 6.7 6.5 - 8.1 g/dL   Albumin 3.6 3.5 - 5.0 g/dL   AST 58 (H) 15 - 41 U/L   ALT 37 0 - 44 U/L   Alkaline Phosphatase 238 (H) 38 - 126 U/L   Total Bilirubin 1.5 (H) 0.3 - 1.2 mg/dL   GFR, Estimated 7 (L) >60 mL/min    Comment: (NOTE) Calculated using the CKD-EPI Creatinine Equation (2021)    Anion gap 15 5 - 15    Comment: Performed at [facility] [address] [place], [place] [address]  CBC with Differential     Status:  Abnormal   Collection Time: 07/17/21  6:47 PM  Result Value Ref Range   WBC 8.7 4.0 - 10.5 K/uL   RBC 3.72 (L) 3.87 - 5.11 MIL/uL   Hemoglobin 11.3 (L) 12.0 - 15.0 g/dL   HCT 36.9 36.0 - 46.0 %   MCV 99.2 80.0 - 100.0 fL   MCH 30.4 26.0 - 34.0 pg   MCHC 30.6 30.0 - 36.0 g/dL   RDW 17.3 (H) 11.5 - 15.5 %   Platelets 172 150 - 400 K/uL   nRBC 0.0 0.0 - 0.2 %   Neutrophils Relative % 80 %   Neutro Abs 6.9 1.7 - 7.7 K/uL   Lymphocytes Relative 9 %   Lymphs Abs 0.8 0.7 - 4.0 K/uL   Monocytes Relative 11 %   Monocytes Absolute 1.0 0.1 - 1.0 K/uL   Eosinophils Relative 0 %   Eosinophils Absolute 0.0 0.0 - 0.5 K/uL   Basophils Relative 0 %   Basophils Absolute 0.0 0.0 - 0.1 K/uL   Immature Granulocytes 0 %   Abs Immature Granulocytes 0.03 0.00 - 0.07 K/uL    Comment: Performed at [facility] [address], [place], [place] [address]  Resp Panel by RT-PCR (Flu A&B, Covid) Nasopharyngeal Swab     Status: None   Collection Time: 07/17/21  7:12 PM   Specimen: Nasopharyngeal Swab; Nasopharyngeal(NP) swabs in vial transport medium  Result Value Ref Range   SARS Coronavirus 2 by RT PCR NEGATIVE NEGATIVE    Comment: (NOTE) SARS-CoV-2 target nucleic acids are NOT DETECTED.  The SARS-CoV-2 RNA is generally detectable in upper respiratory specimens during the acute phase of infection. The lowest concentration of SARS-CoV-2 viral copies this assay can detect is 138 copies/mL. A negative result does not preclude SARS-Cov-2 infection and should not be used as the sole basis for treatment or other patient management decisions. A negative result may occur with  improper specimen collection/handling, submission of specimen other than nasopharyngeal swab, presence of viral mutation(s) within the areas targeted by this assay, and inadequate number of viral copies(<138 copies/mL). A negative result  must be combined with clinical observations, patient history, and  epidemiological information. The expected result is Negative.  Fact Sheet for Patients:  EntrepreneurPulse.com.au  Fact Sheet for Healthcare Providers:  IncredibleEmployment.be  This test is no t yet approved or cleared by the [place] FDA and  has been authorized for detection and/or diagnosis of SARS-CoV-2 by FDA under an Emergency Use Authorization (EUA). This EUA will remain  in effect (meaning this test can be used) for the duration of the COVID-19 declaration under Section 564(b)(1) of the Act, 21 U.S.C.section 360bbb-3(b)(1), unless the authorization is terminated  or revoked sooner.       Influenza A by PCR NEGATIVE NEGATIVE   Influenza B by PCR NEGATIVE NEGATIVE    Comment: (NOTE) The Xpert Xpress SARS-CoV-2/FLU/RSV plus assay is intended as an aid in the diagnosis of influenza from Nasopharyngeal swab specimens and should not be used as a sole basis for treatment. Nasal washings and aspirates are unacceptable for Xpert Xpress SARS-CoV-2/FLU/RSV testing.  Fact Sheet for Patients: EntrepreneurPulse.com.au  Fact Sheet for Healthcare Providers: IncredibleEmployment.be  This test is not yet approved or cleared by the [place] FDA and has been authorized for detection and/or diagnosis of SARS-CoV-2 by FDA under an Emergency Use Authorization (EUA). This EUA will remain in effect (meaning this test can be used) for the duration of the COVID-19 declaration under Section 564(b)(1) of the Act, 21 U.S.C. section 360bbb-3(b)(1), unless the authorization is terminated or revoked.  Performed at [facility], [place] [address], [place], [place] [address]      ROS: See HPI   Physical Exam: Vitals:   07/18/21 0940 07/18/21 1250  BP: 129/63 111/72  Pulse: 77   Resp: 18   Temp:  97.7 F (36.5 C)  SpO2: 91% 92%     General: Alert calm elderly female NAD HEENT: [place], PERRLA, nonicteric,  MMM Neck: No JVD Heart: RRR no MRG Lungs: CTA nonlabored breathing is cannula oxygen Abdomen: NABS, soft NT ND no abdominal wall edema  Extremities: No pedal edema Skin: Warm dry no ulcers or overt rash Neuro: Alert O x3 no acute focal deficits appreciated moves all extremities to command Dialysis Access: Positive bruit left arm AV fistula  Dialysis Orders: Center: [facility],MWF, 4hr Edw 88.5,2k, 3ca bath uf2 Hec 2 No Hep Venofer 50  No ESA 2/2 CA    Assessment/Plan Intractable BAck Pain with pathologic fracture of L4 due to metastatic carcinoma= pain meds per admit ESRD -  HD MWF, k3.8 and Vol. Ok will do  HD tomorrow 2/2 more urgent # pts, /low HD staffing, noted on Lasix follow-up urine output may not need it Hypertension/volume  -noted pleural effusion on chest x-ray with no shortness of breath blood pressure controlled 129/63/attempt 2 L UF on dialysis tomorrow, on Toprol XL  BID follow-up BP trend/ is on midodrine 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  In sinus rhythm on exam Nutrition -albumin 3.6 renal diet vitamin  Ernest Haber, PA-C [place] [phone] 07/18/2021, 3:22 PM   Pt seen, examined and agree w A/P as above.  Kelly Splinter  MD 07/18/2021, 4:43 PM

## 2021-07-18 NOTE — H&P (Addendum)
History and Physical    Melissa Adams:[id number] DOB: 04-11-1946 DOA: 07/17/2021  PCP: [name], NP  Patient coming from: Home  I have personally briefly reviewed patient's old medical records in [place]  Chief Complaint: Back pain  HPI: Melissa Adams is a 74 y.o. female with medical history significant of A.Fib on eliquis, ESRD on MWF dialysis, CHF, Hypotension on midodrine, cirrhosis.  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  ? Breast CA as possible primary on CT imaging.  Oncologist has ordered mammogram and [place].  She has been following with pulm for a pleural effusion, s/p thoracentesis a couple of months ago.  Pt also having uncontrolled back pain for the past couple of weeks determined to be due to a pathologic L4 compression fract.  Pain has worsened, become severe.  She is unable to walk secondary to her severe pain and she reports that 1 week ago while at dialysis her legs gave out and she has not been able to walk since that time.  Patient denies numbness in her legs, loss of bowel or bladder control.  Due to severe back pain, her pulmonologist Dr. Erin Fulling sent pt to ED for possible admission for pain control.   ED Course: CXR showing mod pleural effusion on R.  Pt given fentanyl for pain and hospitalist asked to admit.   Review of Systems: As per HPI, otherwise all review of systems negative.  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   Cardiomyopathy ([place])    CHF (congestive heart failure) ([place])    Chronic kidney disease    Edema    History of hemodialysis    Hyperlipidemia    Hypertension    Hypotension    on midodrine (as of 06/15/21)   Pulmonary hypertension (HCC)    Recurrent right pleural effusion     As of 06/15/21: s/p thoracenteses 05/05/20, 06/11/20, 08/17/20, 11/18/20, 04/06/21   Sleep apnea    Tricuspid regurgitation     Past Surgical History:  Procedure Laterality Date   A/V FISTULAGRAM Left 05/23/2020   Procedure: A/V FISTULAGRAM;  Surgeon: [name], MD;  Location: [place] [facility];  Service: Cardiovascular;  Laterality: Left;   A/V FISTULAGRAM N/A 12/21/2020   Procedure: A/V FISTULAGRAM - Left Upper Arm;  Surgeon: [name], MD;  Location: [place] [facility];  Service: Cardiovascular;  Laterality: N/A;   ABDOMINAL HYSTERECTOMY     AV FISTULA PLACEMENT Left 11/04/2019   Procedure: Insertion Of Arteriovenous (Av) Gore-Tex Graft Arm;  Surgeon: [name], MD;  Location: [place];  Service: Vascular;  Laterality: Left;   BIOPSY  11/14/2020   Procedure: BIOPSY;  Surgeon: Yetta Flock, MD;  Location: [place];  Service: Gastroenterology;;   BIOPSY  11/16/2020   Procedure: BIOPSY;  Surgeon: Yetta Flock, MD;  Location: [place];  Service: Gastroenterology;;   BIOPSY  02/22/2021   Procedure: BIOPSY;  Surgeon: Irving Copas., MD;  Location: [facility] ENDOSCOPY;  Service: Gastroenterology;;   CARDIAC CATHETERIZATION     CARDIOVERSION N/A 10/26/2019   Procedure: CARDIOVERSION;  Surgeon: [name], MD;  Location: [facility] ENDOSCOPY;  Service: Cardiovascular;  Laterality: N/A;   COLONOSCOPY W/ BIOPSIES AND POLYPECTOMY  COLONOSCOPY WITH PROPOFOL N/A 11/16/2020   Procedure: COLONOSCOPY WITH PROPOFOL;  Surgeon: Yetta Flock, MD;  Location: [place];  Service: Gastroenterology;  Laterality: N/A;   ENDOSCOPIC MUCOSAL RESECTION N/A 02/22/2021   Procedure: ENDOSCOPIC MUCOSAL RESECTION;  Surgeon: Rush Landmark Telford Nab., MD;  Location: [place];  Service: Gastroenterology;  Laterality: N/A;   ESOPHAGOGASTRODUODENOSCOPY (EGD) WITH PROPOFOL Left 11/14/2020   Procedure: ESOPHAGOGASTRODUODENOSCOPY (EGD) WITH PROPOFOL;  Surgeon:  Yetta Flock, MD;  Location: [place];  Service: Gastroenterology;  Laterality: Left;   ESOPHAGOGASTRODUODENOSCOPY (EGD) WITH PROPOFOL N/A 02/22/2021   Procedure: ESOPHAGOGASTRODUODENOSCOPY (EGD) WITH PROPOFOL;  Surgeon: Rush Landmark Telford Nab., MD;  Location: [place];  Service: Gastroenterology;  Laterality: N/A;   FISTULA SUPERFICIALIZATION Left 01/21/2020   Procedure: FISTULA SUPERFICIALIZATION OF FISTULA;  Surgeon: [name], MD;  Location: [place];  Service: Vascular;  Laterality: Left;   FISTULA SUPERFICIALIZATION Left 02/24/6833   Procedure: PLICATION OF LEFT UPPER EXTREMITY ARTERIOVENOUS FISTULA;  Surgeon: Waynetta [name], MD;  Location: [place];  Service: Vascular;  Laterality: Left;   GIVENS CAPSULE STUDY N/A 11/16/2020   Procedure: GIVENS CAPSULE STUDY;  Surgeon: Yetta Flock, MD;  Location: [place];  Service: Gastroenterology;  Laterality: N/A;   HEMOSTASIS CLIP PLACEMENT  02/22/2021   Procedure: HEMOSTASIS CLIP PLACEMENT;  Surgeon: Rush Landmark Telford Nab., MD;  Location: [place];  Service: Gastroenterology;;   INSERTION OF DIALYSIS CATHETER Right 11/04/2019   Procedure: CONVERT TEMPORARY DIALYSIS CATHETER TO TUNNELED DIALYSIS CATHETER Right Internal Jugular.;  Surgeon: [name], MD;  Location: [place];  Service: Vascular;  Laterality: Right;   IR THORACENTESIS ASP PLEURAL SPACE W/IMG GUIDE  08/17/2020   MULTIPLE TOOTH EXTRACTIONS     POLYPECTOMY  11/16/2020   Procedure: POLYPECTOMY;  Surgeon: Yetta Flock, MD;  Location: [place];  Service: Gastroenterology;;   RADIOLOGY WITH ANESTHESIA N/A 06/19/2021   Procedure: MRI WITH ANESTHESIA  ,LIVER WITH AND WITHOUT CONTRAST;  Surgeon: Radiologist, Medication, MD;  Location: [place];  Service: Radiology;  Laterality: N/A;   REVISION OF ARTERIOVENOUS GORETEX GRAFT Left 01/21/2020   Procedure: REVISION OF ARTERIOVENOUS FISTULA WITH SIDE BRANCH LIGATION;  Surgeon: [name], MD;  Location: [place];  Service: Vascular;  Laterality: Left;   SUBMUCOSAL LIFTING INJECTION  02/22/2021   Procedure: SUBMUCOSAL LIFTING INJECTION;  Surgeon: Irving Copas., MD;  Location: [place];  Service: Gastroenterology;;   SUBMUCOSAL TATTOO INJECTION  02/22/2021   Procedure: SUBMUCOSAL TATTOO INJECTION;  Surgeon: Irving Copas., MD;  Location: [place];  Service: Gastroenterology;;   TEE WITHOUT CARDIOVERSION N/A 10/26/2019   Procedure: TRANSESOPHAGEAL ECHOCARDIOGRAM (TEE);  Surgeon: [name], MD;  Location: [facility] ENDOSCOPY;  Service: Cardiovascular;  Laterality: N/A;   THORACENTESIS     x 2   THORACENTESIS N/A 11/07/2020   Procedure: Mathews Robinsons;  Surgeon: [name], MD;  Location: [facility] ENDOSCOPY;  Service: Pulmonary;  Laterality: N/A;   THORACENTESIS N/A 04/06/2021   Procedure: THORACENTESIS;  Surgeon: Freddi Starr, MD;  Location: [facility] ENDOSCOPY;  Service: Pulmonary;  Laterality: N/A;   UPPER ESOPHAGEAL ENDOSCOPIC ULTRASOUND (EUS) N/A 02/22/2021   Procedure: UPPER ESOPHAGEAL ENDOSCOPIC ULTRASOUND (EUS);  Surgeon: Irving Copas., MD;  Location: [place];  Service: Gastroenterology;  Laterality: N/A;     reports that she has quit smoking. Her smoking use included cigarettes. She has never used smokeless tobacco. She reports that she does not currently use alcohol. She reports that she does not currently use drugs.  Allergies  Allergen  Reactions   Black Walnut Flavor Hives   Shellfish Allergy Itching    Makes throat itch     Family History  Problem Relation Age of Onset   Seizures Father    CAD Father    Diabetes Sister    Lupus Sister    Hypertension Sister    Colon cancer Neg Hx    Esophageal cancer Neg Hx    Pancreatic cancer Neg Hx    Stomach cancer Neg Hx    Inflammatory bowel disease Neg Hx    Liver disease Neg Hx    Rectal cancer Neg Hx      Prior to Admission medications   Medication Sig  Start Date End Date Taking? Authorizing Provider  acetaminophen (TYLENOL) 325 MG tablet Take 2 tablets (650 mg total) by mouth every 4 (four) hours as needed for headache or mild pain. 11/05/19   Kinnie Feil, MD  albuterol (VENTOLIN HFA) 108 (90 Base) MCG/ACT inhaler Inhale 2 puffs into the lungs every 6 (six) hours as needed for wheezing or shortness of breath. 11/30/19   [name], NP  allopurinol (ZYLOPRIM) 300 MG tablet TAKE 1 TABLET BY MOUTH EVERY NIGHT AT BEDTIME Patient taking differently: Take 300 mg by mouth at bedtime. 03/23/21   [name], NP  atorvastatin (LIPITOR) 80 MG tablet TAKE 1 TABLET (80 MG TOTAL) BY MOUTH AT BEDTIME. 02/26/21   [name], MD  B Complex-C-Zn-Folic Acid (DIALYVITE 315-QMGQ 15) 0.8 MG TABS Take 1 tablet by mouth daily. 01/10/21   [provider]  cetirizine (ZYRTEC) 10 MG tablet Take 10 mg by mouth daily as needed for allergies.    [provider]  cinacalcet (SENSIPAR) 30 MG tablet Take 30 mg by mouth daily with supper.  04/18/20   [provider]  ELIQUIS 5 MG TABS tablet TAKE 1 TABLET (5 MG TOTAL) BY MOUTH 2 (TWO) TIMES DAILY. 04/20/21   [name], MD  esomeprazole (NEXIUM) 40 MG capsule Take 1 capsule (40 mg total) by mouth 2 (two) times daily before a meal. 02/22/21 02/22/22  Mansouraty, Telford Nab., MD  ferric citrate (AURYXIA) 1 GM 210 MG(Fe) tablet Take 210 mg by mouth 3 (three) times daily with meals.    [provider]  furosemide (LASIX) 40 MG tablet Take 80 mg by mouth See admin instructions. Take 2 tablets (80 mg) by mouth twice daily on non-dialysis days - Sunday, Tuesday, Thursday    [provider]  gabapentin (NEURONTIN) 100 MG capsule Take 1 capsule (100 mg total) by mouth 3 (three) times daily. 04/24/21   [name], NP  lidocaine-prilocaine (EMLA) cream Apply 1 application topically See admin instructions. Apply topically to port access one hour prior to dialysis -  Monday, Wednesday, Friday, Saturday 04/07/20   [provider]  metoprolol succinate (TOPROL-XL) 25 MG 24 hr tablet TAKE 1 TABLET BY MOUTH EVERY MORNING & TAKE 1 TABLET EVERY NIGHT AT BEDTIME WITH OR IMMEDIATELY FOLLOWING MEAL Patient taking differently: Take 25 mg by mouth in the morning and at bedtime. 03/27/21   [name], MD  midodrine (PROAMATINE) 10 MG tablet Take 1 tablet (10 mg total) by mouth daily. 11/20/20   Pahwani, Michell Heinrich, MD  montelukast (SINGULAIR) 10 MG tablet Take 1 tablet by mouth once daily Patient taking differently: Take 10 mg by mouth daily as needed (allergies). 11/28/20   [name], NP  Multiple Vitamins-Minerals (MULTIVITAMIN WITH MINERALS) tablet Take 1 tablet by mouth  daily.    [provider]  Nutritional Supplements (,FEEDING SUPPLEMENT, PROSOURCE PLUS) liquid Take 30 mLs by mouth 2 (two) times daily between meals. 11/19/20   Pahwani, Michell Heinrich, MD  triamcinolone 0.1% oint-Eucerin equivalent cream 1:1 mixture Apply topically 3 (three) times daily as needed. APPLY TO DRY AFFECTED AREAS AS NEEDED 3 TIMES ADAY 04/24/21   [name], NP  vitamin B-12 (CYANOCOBALAMIN) 1000 MCG tablet Take 1,000 mcg by mouth daily.    [provider]  vitamin C (VITAMIN C) 500 MG tablet Take 1 tablet (500 mg total) by mouth daily. 11/06/19   Kinnie Feil, MD    Physical Exam: Vitals:   07/17/21 1841 07/17/21 2049 07/18/21 0006 07/18/21 0220  BP: 115/63 121/72 119/70 129/63  Pulse: (!) 111 88 71 (!) 57  Resp: 18 18 16 18   Temp: 99.3 F (37.4 C)     TempSrc: Oral     SpO2: 94% 95% 96% 97%    Constitutional: NAD, calm, comfortable Eyes: PERRL, lids and conjunctivae normal ENMT: Mucous membranes are moist. Posterior pharynx clear of any exudate or lesions.Normal dentition.  Neck: normal, supple, no masses, no thyromegaly Respiratory: clear to auscultation bilaterally, no wheezing, no crackles. Normal respiratory effort. No accessory  muscle use.  Cardiovascular: Regular rate and rhythm, no murmurs / rubs / gallops. No extremity edema. 2+ pedal pulses. No carotid bruits.  Abdomen: no tenderness, no masses palpated. No hepatosplenomegaly. Bowel sounds positive.  Musculoskeletal: Midline back TTP Skin: no rashes, lesions, ulcers. No induration Neurologic: CN 2-12 grossly intact. Sensation intact, DTR normal. Strength 5/5 in all 4.  Psychiatric: Normal judgment and insight. Alert and oriented x 3. Normal mood.    Labs on Admission: I have personally reviewed following labs and imaging studies  CBC: Recent Labs  Lab 07/17/21 1847  WBC 8.7  NEUTROABS 6.9  HGB 11.3*  HCT 36.9  MCV 99.2  PLT 440   Basic Metabolic Panel: Recent Labs  Lab 07/17/21 1847  NA 133*  K 3.8  CL 95*  CO2 23  GLUCOSE 86  BUN 29*  CREATININE 5.81*  CALCIUM 9.6   GFR: Estimated Creatinine Clearance: 9.1 mL/min (A) (by C-G formula based on SCr of 5.81 mg/dL (H)). Liver Function Tests: Recent Labs  Lab 07/17/21 1847  AST 58*  ALT 37  ALKPHOS 238*  BILITOT 1.5*  PROT 6.7  ALBUMIN 3.6   No results for input(s): LIPASE, AMYLASE in the last 168 hours. No results for input(s): AMMONIA in the last 168 hours. Coagulation Profile: No results for input(s): INR, PROTIME in the last 168 hours. Cardiac Enzymes: No results for input(s): CKTOTAL, CKMB, CKMBINDEX, TROPONINI in the last 168 hours. BNP (last 3 results) No results for input(s): PROBNP in the last 8760 hours. HbA1C: No results for input(s): HGBA1C in the last 72 hours. CBG: No results for input(s): GLUCAP in the last 168 hours. Lipid Profile: No results for input(s): CHOL, HDL, LDLCALC, TRIG, CHOLHDL, LDLDIRECT in the last 72 hours. Thyroid Function Tests: No results for input(s): TSH, T4TOTAL, FREET4, T3FREE, THYROIDAB in the last 72 hours. Anemia Panel: No results for input(s): VITAMINB12, FOLATE, FERRITIN, TIBC, IRON, RETICCTPCT in the last 72 hours. Urine analysis:     Component Value Date/Time   COLORURINE YELLOW 10/12/2019 1723   APPEARANCEUR HAZY (A) 10/12/2019 1723   LABSPEC 1.009 10/12/2019 1723   PHURINE 5.0 10/12/2019 1723   GLUCOSEU NEGATIVE 10/12/2019 1723   HGBUR SMALL (A) 10/12/2019 1723   BILIRUBINUR  NEGATIVE 10/12/2019 1723   [place] 10/12/2019 1723   PROTEINUR NEGATIVE 10/12/2019 1723   UROBILINOGEN 0.2 06/09/2019 1735   NITRITE NEGATIVE 10/12/2019 1723   LEUKOCYTESUR NEGATIVE 10/12/2019 1723    Radiological Exams on Admission: DG Chest 2 View  Result Date: 07/17/2021 CLINICAL DATA:  Shortness of breath.  Possible lesion. EXAM: CHEST - 2 VIEW COMPARISON:  04/06/2021 FINDINGS: Cardiac enlargement. No vascular congestion. Moderate right pleural effusion. There is right middle lobe consolidation, likely pneumonia. This is progressing since the previous study. Left lung is clear. No pneumothorax. Calcified and tortuous aorta. Degenerative changes in the spine. IMPRESSION: Right middle lobe consolidation with moderate right pleural effusion. Electronically Signed   By: Lucienne Capers M.D.   On: 07/17/2021 19:29    EKG: Independently reviewed.  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 Breast mass as primary? Had biopsy of liver this past month which showed adenocarcinoma. Looks like Dr. Lorenso Courier has ordered diagnostic mammogram and [place]. ESRD - Call nephro in AM for routine dialysis during stay Continue daily Midodrine Chronic systolic CHF  - Cont lasix Cont Toprol XL (takes BID). A.Fib - Cont BID Toprol XL Cont eliquis  DVT prophylaxis: Eliquis Code Status: Full Family Communication: No family in room Disposition Plan: TBD Consults called: None Admission status: Place in 6     Rishab Stoudt, [place] Hospitalists  How to contact the [facility] Attending or Consulting provider [place] or covering provider during after hours [place], for this patient?  Check the care team in [facility] and look for a) attending/consulting TR[facility] provider listed and b) the [facility] team listed Log into [email or web address]  Amion Physician Scheduling and messaging for groups and whole hospitals  On call and physician scheduling software for group practices, residents, hospitalists and other medical providers for call, clinic, rotation and shift schedules. OnCall [employer] is a hospital-wide system for scheduling doctors and paging doctors on call. EasyPlot is for scientific plotting and data analysis.  [email or web address]  and use [place]'s universal password to access. If you do not have the password, please contact the hospital operator.  Locate the [facility] provider you are looking for under [facility] and page to a number that you can be directly reached. If you still have difficulty reaching the provider, please page the [facility] (Director on Call) for the Hospitalists listed on amion for assistance.  07/18/2021, 4:11 AM

## 2021-07-18 NOTE — ED Notes (Signed)
Attempted report x1. 

## 2021-07-18 NOTE — Telephone Encounter (Signed)
Received call from pt's daughter, Delmer Islam. She states she needs to speak to Dr. Lorenso Courier about last week's biopsy results. Pt is currently in the hospital @ [facility] due to increased pain and inability to walk.  The daughter has heard from the ED doctor and pulmonary very briefly about the results of the biopsy. She states she needs to hear from Dr. Lorenso Courier

## 2021-07-19 ENCOUNTER — Ambulatory Visit
Admit: 2021-07-19 | Discharge: 2021-07-19 | Disposition: A | Discharge status: Home / Self Care | Payer: Medicare Other | Attending: Radiation Oncology | Admitting: Radiation Oncology

## 2021-07-19 ENCOUNTER — Telehealth: Payer: Self-pay | Admitting: Hematology and Oncology

## 2021-07-19 ENCOUNTER — Other Ambulatory Visit (INDEPENDENT_AMBULATORY_CARE_PROVIDER_SITE_OTHER): Payer: Self-pay | Admitting: Primary Care

## 2021-07-19 DIAGNOSIS — R531 Weakness: Secondary | ICD-10-CM | POA: Diagnosis present

## 2021-07-19 DIAGNOSIS — R188 Other ascites: Secondary | ICD-10-CM | POA: Diagnosis present

## 2021-07-19 DIAGNOSIS — C787 Secondary malignant neoplasm of liver and intrahepatic bile duct: Secondary | ICD-10-CM

## 2021-07-19 DIAGNOSIS — M4856XA Collapsed vertebra, not elsewhere classified, lumbar region, initial encounter for fracture: Secondary | ICD-10-CM | POA: Diagnosis not present

## 2021-07-19 DIAGNOSIS — Z515 Encounter for palliative care: Secondary | ICD-10-CM | POA: Diagnosis not present

## 2021-07-19 DIAGNOSIS — I959 Hypotension, unspecified: Secondary | ICD-10-CM | POA: Diagnosis not present

## 2021-07-19 DIAGNOSIS — I429 Cardiomyopathy, unspecified: Secondary | ICD-10-CM | POA: Diagnosis present

## 2021-07-19 DIAGNOSIS — C7951 Secondary malignant neoplasm of bone: Secondary | ICD-10-CM | POA: Diagnosis present

## 2021-07-19 DIAGNOSIS — N2581 Secondary hyperparathyroidism of renal origin: Secondary | ICD-10-CM | POA: Diagnosis present

## 2021-07-19 DIAGNOSIS — I482 Chronic atrial fibrillation, unspecified: Secondary | ICD-10-CM | POA: Diagnosis present

## 2021-07-19 DIAGNOSIS — I5022 Chronic systolic (congestive) heart failure: Secondary | ICD-10-CM | POA: Diagnosis present

## 2021-07-19 DIAGNOSIS — D631 Anemia in chronic kidney disease: Secondary | ICD-10-CM | POA: Diagnosis present

## 2021-07-19 DIAGNOSIS — Z6837 Body mass index (BMI) 37.0-37.9, adult: Secondary | ICD-10-CM | POA: Diagnosis not present

## 2021-07-19 DIAGNOSIS — Z20822 Contact with and (suspected) exposure to covid-19: Secondary | ICD-10-CM | POA: Diagnosis present

## 2021-07-19 DIAGNOSIS — J45909 Unspecified asthma, uncomplicated: Secondary | ICD-10-CM | POA: Diagnosis present

## 2021-07-19 DIAGNOSIS — M4856XS Collapsed vertebra, not elsewhere classified, lumbar region, sequela of fracture: Secondary | ICD-10-CM | POA: Diagnosis not present

## 2021-07-19 DIAGNOSIS — I132 Hypertensive heart and chronic kidney disease with heart failure and with stage 5 chronic kidney disease, or end stage renal disease: Secondary | ICD-10-CM | POA: Diagnosis present

## 2021-07-19 DIAGNOSIS — J90 Pleural effusion, not elsewhere classified: Secondary | ICD-10-CM | POA: Diagnosis present

## 2021-07-19 DIAGNOSIS — E46 Unspecified protein-calorie malnutrition: Secondary | ICD-10-CM | POA: Diagnosis present

## 2021-07-19 DIAGNOSIS — N186 End stage renal disease: Secondary | ICD-10-CM | POA: Diagnosis present

## 2021-07-19 DIAGNOSIS — I272 Pulmonary hypertension, unspecified: Secondary | ICD-10-CM | POA: Diagnosis present

## 2021-07-19 DIAGNOSIS — F40240 Claustrophobia: Secondary | ICD-10-CM | POA: Diagnosis present

## 2021-07-19 DIAGNOSIS — Z7189 Other specified counseling: Secondary | ICD-10-CM | POA: Diagnosis not present

## 2021-07-19 DIAGNOSIS — K7460 Unspecified cirrhosis of liver: Secondary | ICD-10-CM | POA: Diagnosis not present

## 2021-07-19 DIAGNOSIS — Z992 Dependence on renal dialysis: Secondary | ICD-10-CM | POA: Diagnosis not present

## 2021-07-19 DIAGNOSIS — C50911 Malignant neoplasm of unspecified site of right female breast: Secondary | ICD-10-CM | POA: Diagnosis present

## 2021-07-19 DIAGNOSIS — K761 Chronic passive congestion of liver: Secondary | ICD-10-CM | POA: Diagnosis present

## 2021-07-19 DIAGNOSIS — Z66 Do not resuscitate: Secondary | ICD-10-CM | POA: Diagnosis not present

## 2021-07-19 DIAGNOSIS — M8458XA Pathological fracture in neoplastic disease, other specified site, initial encounter for fracture: Secondary | ICD-10-CM | POA: Diagnosis present

## 2021-07-19 LAB — GLUCOSE, CAPILLARY: Glucose-Capillary: 166 mg/dL — ABNORMAL HIGH (ref 70–99)

## 2021-07-19 LAB — MRSA NEXT GEN BY PCR, NASAL: MRSA by PCR Next Gen: NOT DETECTED

## 2021-07-19 MED ORDER — HEPARIN (PORCINE) 25000 UT/250ML-% IV SOLN
1100.0000 [IU]/h | INTRAVENOUS | Status: DC
  Administered 2021-07-20: 1100 [IU]/h via INTRAVENOUS
  Filled 2021-07-19 (×2): qty 250

## 2021-07-19 NOTE — Addendum Note (Signed)
Addended by: [name] on: 07/19/2021 10:03 PM   Modules accepted: Orders

## 2021-07-19 NOTE — Telephone Encounter (Signed)
Sent to PCP to refill if appropriate.  

## 2021-07-19 NOTE — Telephone Encounter (Signed)
Notes to clinic:  Review for refills  Medication filled by a different provider Looks like patient has tele visit  on 07/17/2021 can't see any notes    Requested Prescriptions  Pending Prescriptions Disp Refills   metoprolol succinate (TOPROL-XL) 25 MG 24 hr tablet 60 tablet 11    Sig: TAKE 1 TABLET BY MOUTH EVERY MORNING & TAKE 1 TABLET EVERY NIGHT AT BEDTIME WITH OR IMMEDIATELY FOLLOWING MEAL     Cardiovascular:  Beta Blockers Failed - 07/19/2021  1:25 PM      Failed - Last Heart Rate in normal range    Pulse Readings from Last 1 Encounters:  07/19/21 (!) 119          Passed - Last BP in normal range    BP Readings from Last 1 Encounters:  07/19/21 99/64          Passed - Valid encounter within last 6 months    Recent Outpatient Visits           2 days ago    [place], Michelle P, NP   2 months ago Screening for diabetes mellitus   [place], Michelle P, NP   7 months ago Hospital discharge follow-up   [place], [place], NP   1 year ago Need for Tdap vaccination   [place] [name], NP   1 year ago Need for Tdap vaccination   [place] [name] P, NP               apixaban (ELIQUIS) 5 MG TABS tablet 60 tablet 3    Sig: Take 1 tablet (5 mg total) by mouth 2 (two) times daily.     Hematology:  Anticoagulants Failed - 07/19/2021  1:25 PM      Failed - HGB in normal range and within 360 days    Hemoglobin  Date Value Ref Range Status  07/17/2021 11.3 (L) 12.0 - 15.0 g/dL Final  04/12/2021 10.5 (L) 12.0 - 15.0 g/dL Final  06/22/2020 11.4 11.1 - 15.9 g/dL Final   Total hemoglobin  Date Value Ref Range Status  10/31/2019 8.6 (L) 12.0 - 16.0 g/dL Final          Failed - Cr in normal range and within 360 days    Creatinine  Date Value Ref Range Status  04/12/2021 5.57 (HH) 0.44 - 1.00  mg/dL Final    Comment:    CRITICAL RESULT CALLED TO, READ BACK BY AND VERIFIED WITH: beth tracey at 1110 by hflynt 04/12/21    Creatinine, Ser  Date Value Ref Range Status  07/17/2021 5.81 (H) 0.44 - 1.00 mg/dL Final   Creatinine, Urine  Date Value Ref Range Status  10/12/2019 59.39 mg/dL Final          Passed - PLT in normal range and within 360 days    Platelets  Date Value Ref Range Status  07/17/2021 172 150 - 400 K/uL Final  06/22/2020 141 (L) 150 - 450 x10E3/uL Final   Platelet Count  Date Value Ref Range Status  04/12/2021 160 150 - 400 K/uL Final          Passed - HCT in normal range and within 360 days    HCT  Date Value Ref Range Status  07/17/2021 36.9 36.0 - 46.0 % Final   Hematocrit  Date Value Ref Range Status  06/22/2020 35.1 34.0 - 46.6 % Final          Passed - Valid encounter within last 12 months    Recent Outpatient Visits           2 days ago    [place], [place], NP   2 months ago Screening for diabetes mellitus   [place], Michelle P, NP   7 months ago Hospital discharge follow-up   [place] [name], NP   1 year ago Need for Tdap vaccination   [place] [name], NP   1 year ago Need for Tdap vaccination   [place], Michelle P, NP               B Complex-C-Zn-Folic Acid (DIALYVITE 696-EXBM 15) 0.8 MG TABS 30 tablet 0    Sig: Take 1 tablet by mouth daily.     Off-Protocol Failed - 07/19/2021  1:25 PM      Failed - Medication not assigned to a protocol, review manually.      Passed - Valid encounter within last 12 months    Recent Outpatient Visits           2 days ago    [place], [place], NP   2 months ago Screening for diabetes mellitus   [place], Michelle P, NP   7 months  ago Hospital discharge follow-up   [place] [name], NP   1 year ago Need for Tdap vaccination   [place] [name], NP   1 year ago Need for Tdap vaccination   [place], Michelle P, NP               atorvastatin (LIPITOR) 80 MG tablet 30 tablet 5    Sig: Take 1 tablet (80 mg total) by mouth at bedtime.     Cardiovascular:  Antilipid - Statins Passed - 07/19/2021  1:25 PM      Passed - Total Cholesterol in normal range and within 360 days    Cholesterol  Date Value Ref Range Status  10/24/2020 163 0 - 200 mg/dL Final          Passed - LDL in normal range and within 360 days    LDL Cholesterol  Date Value Ref Range Status  10/24/2020 93 0 - 99 mg/dL Final    Comment:           Total Cholesterol/HDL:CHD Risk Coronary Heart Disease Risk Table                     Men   Women  1/2 Average Risk   3.4   3.3  Average Risk       5.0   4.4  2 X Average Risk   9.6   7.1  3 X Average Risk  23.4   11.0        Use the calculated Patient Ratio above and the CHD Risk Table to determine the patient's CHD Risk.        ATP III CLASSIFICATION (LDL):  <100     mg/dL   Optimal  100-129  mg/dL   Near or Above  Optimal  130-159  mg/dL   Borderline  160-189  mg/dL   High  >190     mg/dL   Very High Performed at [facility] [address], [place], [place] [address]           Passed - HDL in normal range and within 360 days    HDL  Date Value Ref Range Status  10/24/2020 60 >40 mg/dL Final          Passed - Triglycerides in normal range and within 360 days    Triglycerides  Date Value Ref Range Status  10/24/2020 51 <150 mg/dL Final          Passed - Patient is not pregnant      Passed - Valid encounter within last 12 months    Recent Outpatient Visits           2 days ago    [place], [place], NP    2 months ago Screening for diabetes mellitus   [place], Michelle P, NP   7 months ago Hospital discharge follow-up   [place], [place], NP   1 year ago Need for Tdap vaccination   [place] [name], NP   1 year ago Need for Tdap vaccination   [place], Michelle P, NP               allopurinol (ZYLOPRIM) 300 MG tablet 30 tablet 3    Sig: Take 1 tablet (300 mg total) by mouth at bedtime.     There is no refill protocol information for this order

## 2021-07-19 NOTE — Anesthesia Preprocedure Evaluation (Addendum)
Anesthesia Evaluation  Patient identified by MRN, date of birth, ID band Patient awake    Reviewed: Allergy & Precautions, H&P , NPO status , Patient's Chart, lab work & pertinent test results, reviewed documented beta blocker date and time   Airway Mallampati: II  TM Distance: >3 FB Neck ROM: Full    Dental no notable dental hx. (+) Poor Dentition, Dental Advisory Given   Pulmonary shortness of breath, asthma , sleep apnea , former smoker,    Pulmonary exam normal breath sounds clear to auscultation       Cardiovascular Exercise Tolerance: Good hypertension, Pt. on medications and Pt. on home beta blockers +CHF  + dysrhythmias Atrial Fibrillation + Valvular Problems/Murmurs AS  Rhythm:Irregular Rate:Normal     Neuro/Psych negative neurological ROS  negative psych ROS   GI/Hepatic negative GI ROS, Neg liver ROS,   Endo/Other  Morbid obesity  Renal/GU ESRF and DialysisRenal disease  negative genitourinary   Musculoskeletal  (+) Arthritis ,   Abdominal   Peds  Hematology  (+) Blood dyscrasia, anemia ,   Anesthesia Other Findings   Reproductive/Obstetrics negative OB ROS                            Anesthesia Physical Anesthesia Plan  ASA: 4  Anesthesia Plan: General   Post-op Pain Management:    Induction: Intravenous  PONV Risk Score and Plan: 3 and Ondansetron, Dexamethasone and Treatment may vary due to age or medical condition  Airway Management Planned: Oral ETT  Additional Equipment:   Intra-op Plan:   Post-operative Plan: Extubation in OR  Informed Consent: I have reviewed the patients History and Physical, chart, labs and discussed the procedure including the risks, benefits and alternatives for the proposed anesthesia with the patient or authorized representative who has indicated his/her understanding and acceptance.     Dental advisory given  Plan Discussed with:  CRNA  Anesthesia Plan Comments:        Anesthesia Quick Evaluation

## 2021-07-19 NOTE — Progress Notes (Signed)
IR team aware of L4 kyphoplasty request. Case and imaging reviewed with Dr. Karenann Cai. Pt potentially a good candidate for Osteocool RF ablation with vertebral augmentation and biopsy. However, would prefer if MRI can be done as ordered. Pt apparently couldn't tolerate due to anxiety, may need sedation. IR will follow, hopefully MR can be performed today and we can then begin planning for procedure.  Ascencion Dike PA-C Interventional Radiology 07/19/2021 11:01 AM

## 2021-07-19 NOTE — Progress Notes (Addendum)
Subjective: Resting in room no complaints per nurse got IV Dilaudid this a.m., for HD today not done yesterday secondary to urgent # pts, /low HD staffing  Objective Vital signs in last 24 hours: Vitals:   07/18/21 2316 07/19/21 0415 07/19/21 0827 07/19/21 1052  BP: 110/60 103/61 (!) 99/57 99/64  Pulse:  (!) 102  (!) 119  Resp:      Temp: (!) 97.5 F (36.4 C) 97.9 F (36.6 C) (!) 97.5 F (36.4 C)   TempSrc: Oral Oral Oral   SpO2:  95% 94%    Weight change:   Physical Exam: General: Slightly lethargic but opens eyes moves extremities to request  elderly female NAD Heart: RRR no MRG Lungs: Decreased breath sounds right otherwise non labored breathing nasal cannula oxygen Abdomen: NABS, soft NT ND no abdominal wall edema  Extremities: Trace bipedal  edema Dialysis Access: Positive bruit left arm AV fistula   Dialysis Orders: Center: [facility],MWF, 4hr Edw 88.5,2k, 3ca bath uf2 Hec 2 No Hep Venofer 50  No ESA 2/2 CA      Problem/Plan: Intractable BAck Pain with pathologic fracture of L4 due to metastatic carcinoma= pain meds per admit,/oncology ESRD -  HD MWF, k3.8 ,8/23 HD for today, follow-up labs no dialysis yesterday secondary  more urgent # pts, /low HD staffing, noted on Lasix follow-up urine output may not need it Hypertension/volume  -noted pleural effusion on chest x-ray with no shortness of breath blood pressure controlled 129/63/attempt 2 L UF on dialysis tomorrow, on Toprol XL  BID follow-up BP trend/ is on midodrine 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  In sinus rhythm on exam Nutrition -albumin 3.6 renal  diet   Ernest Haber, PA-C [facility] Beeper [phone] 07/19/2021,2:08 PM  LOS: 0 days   Labs: Basic Metabolic Panel: Recent Labs  Lab 07/17/21 1847  NA 133*  K 3.8  CL 95*  CO2 23  GLUCOSE 86  BUN 29*  CREATININE 5.81*  CALCIUM 9.6   Liver Function Tests: Recent Labs  Lab 07/17/21 1847  AST 58*  ALT 37  ALKPHOS 238*  BILITOT 1.5*  PROT 6.7  ALBUMIN 3.6   No results for input(s): LIPASE, AMYLASE in the last 168 hours. No results for input(s): AMMONIA in the last 168 hours. CBC: Recent Labs  Lab 07/17/21 1847  WBC 8.7  NEUTROABS 6.9  HGB 11.3*  HCT 36.9  MCV 99.2  PLT 172    Medications:   (feeding supplement) PROSource Plus  30 mL Oral BID BM   allopurinol  300 mg Oral QHS   apixaban  5 mg Oral BID   atorvastatin  80 mg Oral QPM   cinacalcet  30 mg Oral Q supper   doxercalciferol  2 mcg Intravenous Q T,Th,Sa-HD   ferric citrate  420 mg Oral TID WC   ferrous sulfate  324 mg Oral Daily   furosemide  160 mg Oral BID   gabapentin  100 mg Oral TID   metoprolol succinate  25 mg Oral BID   midodrine  10 mg Oral Daily   multivitamin  1 tablet Oral QHS   vitamin B-12  1,000 mcg Oral  Daily     Resting in room

## 2021-07-19 NOTE — Telephone Encounter (Signed)
Called Mrs. Duerksen daughter Ms. Delmer Islam at [phone] to discuss the current work-up and findings.  At this time we note the patient has what appears to be metastatic adenocarcinoma of the liver biopsy with 1 of determine exactly which type of adenocarcinoma.  CT scan of the chest abdomen pelvis are concerning for breast primary with a large breast mass.  We requested a mammogram and ultrasound, however the patient was admitted and we will not bill for these inpatient.  We are currently working with the hospitalist to see if a biopsy of the breast mass would be feasible while she is in house.  Additionally we are in agreement with MRI of the spine with consideration of referral to radiation oncology for palliative radiation to any painful lesions.  The patient's daughter voiced understanding of this plan moving forward.  We will speak with the patient later today to assure that we are on the same page about the time before.  That being said with metastatic adenocarcinoma the patient has a markedly poor prognosis with her history of ESRD, heart disease and liver disease.  Consult to palliative care is reasonable at this time in order to start discussions regarding goals of care.  Ledell Peoples, MD Department of Hematology/Oncology [place] [facility] Phone: [phone] Pager: [phone] Email: [email or web address]

## 2021-07-19 NOTE — Progress Notes (Signed)
PROGRESS NOTE  Nishka Heide Bezold  DOB: 06-14-1946  PCP: [name], NP PPJ:[id number]  DOA: 07/17/2021  LOS: 0 days  Hospital Day: 3   Chief complaint: Back pain  Brief narrative: EMELYN ROEN is a 75 y.o. 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 Patient follows up with oncologist Dr. Lorenso Courier.  She had an MRI of the abdomen under anesthesia done on 06/19/2021 which showed innumerable enhancing lesions throughout both lobes of liver concerning for metastatic disease. On 06/28/2021, she was seen by Dr. Lorenso Courier in the office and had a CT abdomen and pelvis done on 8/12.  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  She also had a small to moderate right pleural effusion for which she sees pulmonologist Dr. Erin Fulling.  She had a thoracentesis in May which did not show malignant cells and cytology. On 8/15, she underwent a liver biopsy that showed adenocarcinoma of unknown primary. On 8/23, patient went to Dr. Terrill Mohr office for follow-up.  She remains in excruciating pain in her lower back over the past few weeks because of which she has not been able to walk on her own. Because of the severity of her pain, she was sent to the ED for inpatient management. Chest x-ray showed right middle lobe consolidation with moderate right pleural effusion. Patient was admitted to hospitalist service for further evaluation management  Subjective: Patient was seen and examined this morning. Lying on bed.  On 2 L oxygen by nasal cannula which he uses at night. Pain controlled with IV Dilaudid. No new symptoms.   Assessment/Plan: Intractable right-sided lower back pain Pathological fracture of L4 vertebrae due  to metastatic disease -Patient unable to tolerate MRI of lumbar spine because of claustrophobia. -Discussed with oncologist Dr. Lorenso Courier, radiation oncologist and IR.  -I have evaluated patient today to see if she is a candidate for vertebroplasty.  -Continue pain control with IV Dilaudid and oral oxycodone..  Metastatic adenocarcinoma of unknown primary -Shown on liver biopsy from 8/15. -Patient was planned for mammogram and breast biopsy as an outpatient however seen her to come into the hospital.  Informally discussed with general surgery team.  Suggested to see if IR can do breast biopsy as an inpatient.  Per IR, they do not do breast biopsy and defer to breast center.  ESRD-HD-MWF -Nephrology consulted -Continue midodrine  Chronic systolic CHF Cardiogenic liver cirrhosis -Last EF 30 to 35%.  Continue Toprol-XL (takes daily) and Lasix  A. fib  -Continue Toprol and Eliquis.  Mobility: PT eval after pain control Code Status:   Code Status: Full Code  Nutritional status: There is no height or weight on file to calculate BMI.     Diet:  Diet Order             Diet renal with fluid restriction Fluid restriction: 1200 mL Fluid; Room service appropriate? Yes; Fluid consistency: Thin  Diet effective now                  DVT prophylaxis:   apixaban (ELIQUIS) tablet 5 mg   Antimicrobials: None Fluid: None Consultants: Oncology, IR Family Communication: None at bedside  Status is: Inpatient  Remains inpatient appropriate because: Intractable pain  Dispo: The patient is from:  Home              Anticipated d/c is to: Pending PT eval              Patient currently is not medically stable to d/c.   Difficult to place patient No     Infusions:    Scheduled Meds:  (feeding supplement) PROSource Plus  30 mL Oral BID BM   allopurinol  300 mg Oral QHS   apixaban  5 mg Oral BID   atorvastatin  80 mg Oral QPM   cinacalcet  30 mg Oral Q supper   doxercalciferol  2 mcg  Intravenous Q T,Th,Sa-HD   ferric citrate  420 mg Oral TID WC   ferrous sulfate  324 mg Oral Daily   furosemide  160 mg Oral BID   gabapentin  100 mg Oral TID   metoprolol succinate  25 mg Oral BID   midodrine  10 mg Oral Daily   multivitamin  1 tablet Oral QHS   vitamin B-12  1,000 mcg Oral Daily    Antimicrobials: Anti-infectives (From admission, onward)    None       PRN meds: acetaminophen **OR** acetaminophen, albuterol, HYDROmorphone (DILAUDID) injection, LORazepam, ondansetron **OR** ondansetron (ZOFRAN) IV   Objective: Vitals:   07/19/21 0415 07/19/21 0827  BP: 103/61 (!) 99/57  Pulse: (!) 102   Resp:    Temp: 97.9 F (36.6 C) (!) 97.5 F (36.4 C)  SpO2: 95% 94%   No intake or output data in the 24 hours ending 07/19/21 0957 There were no vitals filed for this visit. Weight change:  There is no height or weight on file to calculate BMI.   Physical Exam: General exam: Pleasant, elderly African-American female.  Not in distress at the time of my evaluation Skin: No rashes, lesions or ulcers. HEENT: Atraumatic, normocephalic, no obvious bleeding Lungs: Shallow respiratory effort probably because of sedation. CVS: Regular rate and rhythm, no murmur GI/Abd soft, nontender, nondistended, bowel sound present CNS: Partially sedated with pain meds.  Able to follow commands when awake. Psychiatry: Depressed look Extremities: No pedal edema, no calf tenderness  Data Review: I have personally reviewed the laboratory data and studies available.  Recent Labs  Lab 07/17/21 1847  WBC 8.7  NEUTROABS 6.9  HGB 11.3*  HCT 36.9  MCV 99.2  PLT 172    Recent Labs  Lab 07/17/21 1847  NA 133*  K 3.8  CL 95*  CO2 23  GLUCOSE 86  BUN 29*  CREATININE 5.81*  CALCIUM 9.6     F/u labs ordered Unresulted Labs (From admission, onward)    None       Signed, Terrilee Croak, MD [facility] 07/19/2021

## 2021-07-19 NOTE — Progress Notes (Signed)
   07/19/21 1925  Assess: MEWS Score  Temp 97.7 F (36.5 C)  BP 114/77  ECG Heart Rate (!) 109  Resp (!) 22  Level of Consciousness Alert  SpO2 92 %  O2 Device Nasal Cannula  Patient Activity (if Appropriate) In bed  O2 Flow Rate (L/min) 2 L/min  Assess: MEWS Score  MEWS Temp 0  MEWS Systolic 0  MEWS Pulse 1  MEWS RR 1  MEWS LOC 0  MEWS Score 2  MEWS Score Color Yellow  Assess: if the MEWS score is Yellow or Red  Were vital signs taken at a resting state? Yes  Focused Assessment No change from prior assessment  Early Detection of Sepsis Score *See Row Information* Low  MEWS guidelines implemented *See Row Information* Yes  Treat  Pain Scale 0-10  Pain Score 2  Take Vital Signs  Increase Vital Sign Frequency  Yellow: Q 2hr X 2 then Q 4hr X 2, if remains yellow, continue Q 4hrs  Escalate  MEWS: Escalate Yellow: discuss with charge nurse/RN and consider discussing with provider and RRT  Notify: Charge Nurse/RN  Name of Charge Nurse/RN Notified Jamie, RN  Date Charge Nurse/RN Notified 07/19/21  Time Charge Nurse/RN Notified 2201  Document  Patient Outcome Other (Comment) (baseline)  Progress note created (see row info) Yes

## 2021-07-19 NOTE — Progress Notes (Signed)
ANTICOAGULATION CONSULT NOTE - Initial Consult  Pharmacy Consult for heparin Indication: atrial fibrillation  Allergies  Allergen Reactions   Black Walnut Flavor Hives   Shellfish Allergy Itching    Makes throat itch     Patient Measurements:   Heparin Dosing Weight: 73kg  Vital Signs: Temp: 97.5 F (36.4 C) (08/25 0827) Temp Source: Oral (08/25 0827) BP: 99/64 (08/25 1052) Pulse Rate: 119 (08/25 1052)  Labs: Recent Labs    07/17/21 1847  HGB 11.3*  HCT 36.9  PLT 172  CREATININE 5.81*    Estimated Creatinine Clearance: 9.1 mL/min (A) (by C-G formula based on SCr of 5.81 mg/dL (H)).   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  Now to transition to heparin gtt for planned vertebroplasty, last Eliquis dose given at 1100 today  Goal of Therapy:  Heparin level 0.3-0.7 units/ml aPTT 66-102 seconds Monitor platelets by anticoagulation protocol: Yes   Plan:  Heparin gtt at 1100 units/hr tonight ~12h s/p Eliquis dose, no bolus F/u 8 hour aPTT Daily aPTT/HL, CBC, s/s bleeding  Bertis Ruddy, PharmD Clinical Pharmacist ED Pharmacist Phone # [phone] 07/19/2021 2:46 PM

## 2021-07-19 NOTE — Telephone Encounter (Signed)
Copied from [place] [phone]. Topic: Quick Communication - Rx Refill/Question >> Jul 19, 2021  1:14 PM Leward Quan A wrote: Medication: allopurinol (ZYLOPRIM) tablet 300 mg, multivitamin (RENA-VIT) tablet 1 tablet, atorvastatin (LIPITOR) 80 MG tablet, metoprolol succinate (TOPROL-XL) 25 MG 24 hr tablet, B Complex-C-Zn-Folic Acid (DIALYVITE [id number]) 0.8 MG TABS, ELIQUIS 5 MG TABS tablet   Has the patient contacted their pharmacy? Yes.   (Agent: If no, request that the patient contact the pharmacy for the refill.) (Agent: If yes, when and what did the pharmacy advise?)  Preferred Pharmacy (with phone number or street name): [place], [place]  Phone:  [phone] Fax:  [phone]     Agent: Please be advised that RX refills may take up to 3 business days. We ask that you follow-up with your pharmacy.

## 2021-07-20 ENCOUNTER — Inpatient Hospital Stay (HOSPITAL_COMMUNITY): Payer: Medicare Other

## 2021-07-20 ENCOUNTER — Inpatient Hospital Stay (HOSPITAL_COMMUNITY): Payer: Medicare Other | Admitting: Anesthesiology

## 2021-07-20 ENCOUNTER — Encounter (HOSPITAL_COMMUNITY): Payer: Self-pay | Admitting: Internal Medicine

## 2021-07-20 ENCOUNTER — Encounter (HOSPITAL_COMMUNITY)
Admission: EM | Disposition: A | Discharge status: Home Health | Payer: Self-pay | Source: Home / Self Care | Attending: Internal Medicine

## 2021-07-20 DIAGNOSIS — Z7189 Other specified counseling: Secondary | ICD-10-CM

## 2021-07-20 DIAGNOSIS — Z515 Encounter for palliative care: Secondary | ICD-10-CM

## 2021-07-20 DIAGNOSIS — Z66 Do not resuscitate: Secondary | ICD-10-CM

## 2021-07-20 DIAGNOSIS — M4856XA Collapsed vertebra, not elsewhere classified, lumbar region, initial encounter for fracture: Secondary | ICD-10-CM | POA: Diagnosis not present

## 2021-07-20 HISTORY — PX: IR THORACENTESIS ASP PLEURAL SPACE W/IMG GUIDE: IMG5380

## 2021-07-20 HISTORY — PX: RADIOLOGY WITH ANESTHESIA: SHX6223

## 2021-07-20 LAB — POCT I-STAT, CHEM 8
BUN: 31 mg/dL — ABNORMAL HIGH (ref 8–23)
Calcium, Ion: 0.97 mmol/L — ABNORMAL LOW (ref 1.15–1.40)
Chloride: 100 mmol/L (ref 98–111)
Creatinine, Ser: 4.8 mg/dL — ABNORMAL HIGH (ref 0.44–1.00)
Glucose, Bld: 86 mg/dL (ref 70–99)
HCT: 30 % — ABNORMAL LOW (ref 36.0–46.0)
Hemoglobin: 10.2 g/dL — ABNORMAL LOW (ref 12.0–15.0)
Potassium: 3.8 mmol/L (ref 3.5–5.1)
Sodium: 140 mmol/L (ref 135–145)
TCO2: 30 mmol/L (ref 22–32)

## 2021-07-20 LAB — CBC
HCT: 33.4 % — ABNORMAL LOW (ref 36.0–46.0)
Hemoglobin: 10.3 g/dL — ABNORMAL LOW (ref 12.0–15.0)
MCH: 30.6 pg (ref 26.0–34.0)
MCHC: 30.8 g/dL (ref 30.0–36.0)
MCV: 99.1 fL (ref 80.0–100.0)
Platelets: 91 10*3/uL — ABNORMAL LOW (ref 150–400)
RBC: 3.37 MIL/uL — ABNORMAL LOW (ref 3.87–5.11)
RDW: 17.7 % — ABNORMAL HIGH (ref 11.5–15.5)
WBC: 8.2 10*3/uL (ref 4.0–10.5)
nRBC: 0 % (ref 0.0–0.2)

## 2021-07-20 LAB — PHOSPHORUS: Phosphorus: 4.8 mg/dL — ABNORMAL HIGH (ref 2.5–4.6)

## 2021-07-20 LAB — APTT: aPTT: 134 seconds — ABNORMAL HIGH (ref 24–36)

## 2021-07-20 LAB — HEPARIN LEVEL (UNFRACTIONATED): Heparin Unfractionated: >1.1 IU/mL — ABNORMAL HIGH (ref 0.30–0.70)

## 2021-07-20 SURGERY — MRI WITH ANESTHESIA
Anesthesia: General

## 2021-07-20 MED ORDER — PHENYLEPHRINE 40 MCG/ML (10ML) SYRINGE FOR IV PUSH (FOR BLOOD PRESSURE SUPPORT)
PREFILLED_SYRINGE | INTRAVENOUS | Status: DC | PRN
  Administered 2021-07-20 (×3): 80 ug via INTRAVENOUS

## 2021-07-20 MED ORDER — CHLORHEXIDINE GLUCONATE 0.12 % MT SOLN
15.0000 mL | Freq: Once | OROMUCOSAL | Status: AC
  Administered 2021-07-20: 15 mL via OROMUCOSAL

## 2021-07-20 MED ORDER — ORAL CARE MOUTH RINSE: 15.0000 mL | Freq: Once | OROMUCOSAL | Status: AC

## 2021-07-20 MED ORDER — SODIUM CHLORIDE 0.9 % IV SOLN: INTRAVENOUS | Status: DC

## 2021-07-20 MED ORDER — ALLOPURINOL 300 MG PO TABS: 300.0000 mg | ORAL_TABLET | Freq: Every day | ORAL | 3 refills | Status: DC

## 2021-07-20 MED ORDER — PHENYLEPHRINE HCL-NACL 20-0.9 MG/250ML-% IV SOLN
INTRAVENOUS | Status: DC | PRN
Start: 2021-07-20 — End: 2021-07-20
  Administered 2021-07-20: 25 ug/min via INTRAVENOUS

## 2021-07-20 MED ORDER — METOPROLOL TARTRATE 12.5 MG HALF TABLET
12.5000 mg | ORAL_TABLET | Freq: Two times a day (BID) | ORAL | Status: DC
  Administered 2021-07-20 – 2021-07-31 (×15): 12.5 mg via ORAL
  Filled 2021-07-20 (×18): qty 1

## 2021-07-20 MED ORDER — APIXABAN 5 MG PO TABS: 5.0000 mg | ORAL_TABLET | Freq: Two times a day (BID) | ORAL | 3 refills | Status: DC

## 2021-07-20 MED ORDER — DIALYVITE 800-ZINC 15 0.8 MG PO TABS: 1.0000 | ORAL_TABLET | Freq: Every day | ORAL | 0 refills | Status: DC

## 2021-07-20 MED ORDER — LIDOCAINE HCL 1 % IJ SOLN
INTRAMUSCULAR | Status: AC
  Filled 2021-07-20: qty 20

## 2021-07-20 MED ORDER — ONDANSETRON HCL 4 MG/2ML IJ SOLN
INTRAMUSCULAR | Status: DC | PRN
  Administered 2021-07-20: 4 mg via INTRAVENOUS

## 2021-07-20 MED ORDER — METOPROLOL SUCCINATE ER 25 MG PO TB24: ORAL_TABLET | ORAL | 11 refills | Status: DC

## 2021-07-20 MED ORDER — ETOMIDATE 2 MG/ML IV SOLN
INTRAVENOUS | Status: DC | PRN
  Administered 2021-07-20: 8 mg via INTRAVENOUS

## 2021-07-20 MED ORDER — CHLORHEXIDINE GLUCONATE 0.12 % MT SOLN
OROMUCOSAL | Status: AC
  Filled 2021-07-20: qty 15

## 2021-07-20 MED ORDER — VASOPRESSIN 20 UNIT/ML IV SOLN
INTRAVENOUS | Status: DC | PRN
  Administered 2021-07-20: 1 [IU] via INTRAVENOUS
  Administered 2021-07-20: 2 [IU] via INTRAVENOUS

## 2021-07-20 MED ORDER — DEXAMETHASONE SODIUM PHOSPHATE 10 MG/ML IJ SOLN
INTRAMUSCULAR | Status: DC | PRN
  Administered 2021-07-20: 4 mg via INTRAVENOUS

## 2021-07-20 MED ORDER — ATORVASTATIN CALCIUM 80 MG PO TABS: 80.0000 mg | ORAL_TABLET | Freq: Every day | ORAL | 5 refills | Status: DC

## 2021-07-20 MED ORDER — HEPARIN (PORCINE) 25000 UT/250ML-% IV SOLN
1000.0000 [IU]/h | INTRAVENOUS | Status: AC
  Administered 2021-07-21: 950 [IU]/h via INTRAVENOUS
  Filled 2021-07-20 (×2): qty 250

## 2021-07-20 MED ORDER — SUCCINYLCHOLINE CHLORIDE 200 MG/10ML IV SOSY
PREFILLED_SYRINGE | INTRAVENOUS | Status: DC | PRN
  Administered 2021-07-20: 100 mg via INTRAVENOUS

## 2021-07-20 MED ORDER — LIDOCAINE HCL 1 % IJ SOLN
INTRAMUSCULAR | Status: AC | PRN
Start: 2021-07-20 — End: 2021-07-20
  Administered 2021-07-20: 15 mL

## 2021-07-20 NOTE — Progress Notes (Signed)
  Request seen for radiofrequency ablation and kyphoplasty.  MRI images reviewed by Dr. Karenann Cai.  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 More related to degenerative changes than fracture itself.   She recommends consult from neurosurgery to see if they would do open surgery instead because of the stenosis however patient is a poor candidate for open surgery.  After further discussions, there will be a palliative care consult over the weekend.  It is not unreasonable to pursue Osteocool and KP for pain control.    Will follow chart over weekend and plan for possible IR procedure on Monday.  Melissa Adams S Marycruz Boehner PA-C 07/20/2021 1:32 PM

## 2021-07-20 NOTE — Progress Notes (Addendum)
[facility] Progress Note   Subjective:   Patient seen and examined at bedside post procedure.  Busy morning, thoracentesis with 1.2L clear amber fluid, followed by CXR and MRI spine.  Does not recall going to the procedure but otherwise AAOx3.  Denies pain, SOB, edema, orthopnea and n/v/d.  Reports being hungry.   Objective Vitals:   07/20/21 1139 07/20/21 1152 07/20/21 1208 07/20/21 1233  BP: 105/60 95/73 111/72 96/62  Pulse: 94 90 97 (!) 110  Resp: (!) 9 15 16 16   Temp: (!) 97.1 F (36.2 C)   98.7 F (37.1 C)  TempSrc:    Oral  SpO2: 95% 93% 100% 100%  Weight:      Height:       Physical Exam General:chronically ill appearing female in NAD, AAOx3 Heart:RRR no mrg Lungs:mostly CTAB, breath sounds decreased on R, nml WOB on 5L O2 via [place] Abdomen:soft, NTND Extremities:no LE edema Dialysis Access: LU AVF +b/t   Filed Weights   07/19/21 1545 07/20/21 0840  Weight: 93.4 kg 93.4 kg    Intake/Output Summary (Last 24 hours) at 07/20/2021 1536 Last data filed at 07/20/2021 1500 Gross per 24 hour  Intake 767.27 ml  Output 1000 ml  Net -232.73 ml    Additional Objective Labs: Basic Metabolic Panel: Recent Labs  Lab 07/17/21 1847 07/20/21 0914  NA 133* 140  K 3.8 3.8  CL 95* 100  CO2 23  --   GLUCOSE 86 86  BUN 29* 31*  CREATININE 5.81* 4.80*  CALCIUM 9.6  --    Liver Function Tests: Recent Labs  Lab 07/17/21 1847  AST 58*  ALT 37  ALKPHOS 238*  BILITOT 1.5*  PROT 6.7  ALBUMIN 3.6   CBC: Recent Labs  Lab 07/17/21 1847 07/20/21 0802 07/20/21 0914  WBC 8.7 8.2  --   NEUTROABS 6.9  --   --   HGB 11.3* 10.3* 10.2*  HCT 36.9 33.4* 30.0*  MCV 99.2 99.1  --   PLT 172 91*  --    CBG: Recent Labs  Lab 07/19/21 2211  GLUCAP 166*    Studies/Results: MR LUMBAR SPINE WO CONTRAST  Result Date: 07/20/2021 CLINICAL DATA:  Low back pain EXAM: MRI LUMBAR SPINE WITHOUT CONTRAST TECHNIQUE: Multiplanar, multisequence MR imaging of the lumbar  spine was performed. No intravenous contrast was administered. COMPARISON:  No prior MRI. FINDINGS: Evaluation is limited by artifact from the degree of edema in the subcutaneous tissues. Segmentation: Standard. Alignment:  Physiologic. Vertebrae: Diffusely decreased T1 and heterogeneously increased T2 signal within the L4 vertebral body and pedicles. Vertebral body height loss, mild bowing of the posterior cortex inferiorly. Additional smaller T1 hypointense, T2 hyperintense lesions in L2 (series 6, images 7 and 9), L3 (series 6, image 9), L5 (series 6, images 7 and 10), and S1 (series 6, images 6 and 10). Multilevel endplate degenerative changes. Conus medullaris and cauda equina: Conus extends to the L1 level. Conus and cauda equina appear normal. No epidural extension of tumor. Paraspinal and other soft tissues: Multiple T1 and T2 hyperintense lesions in the liver and kidneys. Edema in the subcutaneous tissues. Disc levels: T12-L1: No significant disc bulge. Mild facet arthropathy. No spinal canal stenosis or neural foraminal narrowing. L1-L2: Mild disc bulge. Moderate facet arthropathy. No spinal canal stenosis. No neural foraminal narrowing. L2-L3: Disc height loss with broad-based disc bulge. Moderate severe facet arthropathy and ligamentum flavum hypertrophy. Mild spinal canal stenosis. Moderate right neural foraminal narrowing. L3-L4: Broad-based disc bulge, somewhat  eccentric to the right. Moderate facet arthropathy with ligamentum flavum hypertrophy. No spinal canal stenosis. Mild left and severe right neural foraminal narrowing. L4-L5: Mild narrowing of the spinal canal secondary to L4 pathologic compression fracture. Broad-based disc bulge. Severe facet arthropathy. Ligamentum flavum hypertrophy. Severe spinal canal stenosis. Severe bilateral neural foraminal narrowing. L5-S1: Broad-based disc bulge. Severe facet arthropathy. Ligamentum flavum hypertrophy. No spinal canal stenosis. Moderate bilateral  neural foraminal narrowing. IMPRESSION: 1. Evaluation is somewhat limited by artifact caused by the degree of edema in the subcutaneous tissues. Within this limitation, diffusely abnormal marrow signal within the L4 vertebral body with vertebral body height loss, consistent with pathologic compression fracture. In addition to pre-existing degenerative changes, the compression fracture adds mild additional narrowing of the spinal canal. 2. Multiple smaller foci of abnormal signal in L2, L3, L5, and S1, also concerning for metastatic disease. 3. L4-L5 severe spinal canal stenosis and bilateral neural foraminal narrowing, secondary to a combination of pathologic compression fracture and severe degenerative changes. 4. L2-L3 mild spinal canal stenosis and moderate right neural foraminal narrowing. 5. L3-L4 severe right neural foraminal narrowing and L5-S1 moderate bilateral neural foraminal narrowing. Electronically Signed   By: Merilyn Baba M.D.   On: 07/20/2021 12:43   DG CHEST PORT 1 VIEW  Result Date: 07/20/2021 CLINICAL DATA:  Status post right thoracentesis EXAM: PORTABLE CHEST 1 VIEW COMPARISON:  Chest radiograph 07/17/2021 FINDINGS: The heart is enlarged, unchanged. The mediastinal contours are stable. The right pleural effusion has decreased in size following thoracentesis, with improved aeration of the right base. There is no significant left effusion. There is no new focal airspace disease. There is no appreciable pneumothorax. IMPRESSION: Decreased right pleural effusion with improved aeration of the right base following thoracentesis. No appreciable pneumothorax. Electronically Signed   By: Valetta Mole M.D.   On: 07/20/2021 10:52   IR THORACENTESIS ASP PLEURAL SPACE W/IMG GUIDE  Result Date: 07/20/2021 INDICATION: End-stage renal disease on hemodialysis. Recurrent right pleural effusion. Request for therapeutic thoracentesis. EXAM: ULTRASOUND GUIDED RIGHT THORACENTESIS MEDICATIONS: 1% lidocaine 15  mL COMPLICATIONS: None immediate. PROCEDURE: An ultrasound guided thoracentesis was thoroughly discussed with the patient and questions answered. The benefits, risks, alternatives and complications were also discussed. The patient understands and wishes to proceed with the procedure. Written consent was obtained. Ultrasound was performed to localize and mark an adequate pocket of fluid in the right chest. The area was then prepped and draped in the normal sterile fashion. 1% Lidocaine was used for local anesthesia. Under ultrasound guidance a 6 Fr Safe-T-Centesis catheter was introduced. Thoracentesis was performed. The catheter was removed and a dressing applied. FINDINGS: A total of approximately 1.2 L of clear amber fluid was removed. IMPRESSION: Successful ultrasound guided right thoracentesis yielding 1.2 L of pleural fluid. No pneumothorax on post-procedure chest x-ray. Read by: Gareth Eagle, PA-C Electronically Signed   By: Aletta Edouard M.D.   On: 07/20/2021 12:18    Medications:  heparin 1,100 Units/hr (07/20/21 1127)    (feeding supplement) PROSource Plus  30 mL Oral BID BM   allopurinol  300 mg Oral QHS   atorvastatin  80 mg Oral QPM   chlorhexidine       cinacalcet  30 mg Oral Q supper   doxercalciferol  2 mcg Intravenous Q T,Th,Sa-HD   ferric citrate  420 mg Oral TID WC   ferrous sulfate  324 mg Oral Daily   gabapentin  100 mg Oral TID   lidocaine  metoprolol tartrate  12.5 mg Oral BID   midodrine  10 mg Oral Daily   multivitamin  1 tablet Oral QHS   vitamin B-12  1,000 mcg Oral Daily    Dialysis Orders: [facility],MWF, 4hr Edw 88.5,2k, 3ca bath uf2 Hec 2 No Hep Venofer 50  No ESA 2/2 CA       Problem/Plan: Intractable Back Pain with pathologic fracture of L4 due to metastatic carcinoma.  MRI spine with severe degenerative changes of lumbar spine, lesion likely representing metastatic disease, +L4-L5 compression fracture. pain meds/management per admit/oncology ESRD -   HD MWF, off schedule this week due to staffing issues and increased patient census.  HD yesterday evening.  Plan for next HD Monday.  Labs and volume stable, follow closely.  R pleural effusion - s/p IR paracentesis today with 1.2L yield clear amber fluids. CXR with improvement in pleural effusion.  Hypertension/volume    - BP well controlled with lopressor 12.5mg  BID and midodrine 10mg  daily.  Does not appear grossly volume overloaded. UF as tolerated.  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 Lorenso Courier oncology noted notes. Palliative care consulted, patient now DNAR/DNI.  Family meeting planned for tomorrow.  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  Check phos. continue Hectorol 47mcg, Sensipar, auryxia Cardiomyopathy /EF 3035%= UF on HD as tolerated 2 L Atrial fib on lopressor and Eliquis.   Nutrition -albumin 3.6 renal diet  Jen Mow, PA-C [place] 07/20/2021,3:36 PM  LOS: 1 day   Pt seen, examined and agree w A/P as above.  Kelly Splinter  MD 07/21/2021, 9:12 AM

## 2021-07-20 NOTE — Progress Notes (Signed)
ANTICOAGULATION CONSULT NOTE - Follow Up Consult  Pharmacy Consult for IV Heparin Indication: atrial fibrillation  Allergies  Allergen Reactions   Black Walnut Flavor Hives   Shellfish Allergy Itching    Makes throat itch     Patient Measurements: Height: 5\' 3"  (160 cm) Weight: 93.4 kg (205 lb 14.6 oz) IBW/kg (Calculated) : 52.4 Heparin Dosing Weight: 73 kg  Vital Signs: Temp: 98.6 F (37 C) (08/26 1639) Temp Source: Oral (08/26 1639) BP: 98/55 (08/26 1639) Pulse Rate: 100 (08/26 1639)  Labs: Recent Labs    07/17/21 1847 07/20/21 0802 07/20/21 0914 07/20/21 1700  HGB 11.3* 10.3* 10.2*  --   HCT 36.9 33.4* 30.0*  --   PLT 172 91*  --   --   APTT  --   --   --  134*  HEPARINUNFRC  --   --   --  >1.10*  CREATININE 5.81*  --  4.80*  --     Estimated Creatinine Clearance: 11.2 mL/min (A) (by C-G formula based on SCr of 4.8 mg/dL (H)).  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 Pt has hx of atrial fibrillation, for which she was on apixaban PTA (last dose at 1100 on 8/25). Pt was transitioned to heparin infusion for planned vertebroplasty; heparin infusion was held for procedure with radiation oncology today, then restarted ~1130 AM today.  aPTT and heparin level ~5.5 hrs after heparin  infusion was restarted at 1100 units/hr were 134 sec (above the goal range) and >1.10, respectively. H/H 10.2/30.0, plt 91 (plt trending down, will monitor closely). Per RN, no issues with IV or bleeding observed.  Goal of Therapy:  Heparin level 0.3-0.7 units/ml aPTT 66-102 seconds Monitor platelets by anticoagulation protocol: Yes   Plan:  Hold heparin infusion for 1 hr, then resume heparin infusion at 950 units/hr Check aPTT/heparin level 8 hrs after resuming heparin infusion Monitor daily aPTT, heparin level, CBC (monitor plt trend closely) Monitor for bleeding  Gillermina Hu, PharmD, BCPS, [facility] Clinical Pharmacist 07/20/2021 6:24 PM

## 2021-07-20 NOTE — Consult Note (Signed)
Palliative Medicine Inpatient Consult Note  Consulting Provider: Terrilee Croak, MD  Reason for consult:   [facility] Consult  Reason for Consult? GOC    HPI:  Per intake H&P --> Melissa Adams is a 75 y.o. 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 As of July 2022 (+) innumerable enhancing lesions throughout both lobes of liver concerning for metastatic disease. 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  Has been experiencing excruciating pain.   Palliative care has been asked to get involved to further address goals of care and symptom management.   Clinical Assessment/Goals of Care:  *Please note that this is a verbal dictation therefore any spelling or grammatical errors are due to the "[place]" system interpretation.  I have reviewed medical records including EPIC notes, labs and imaging, received report from bedside RN, assessed the patient who just returned from MRI and is lying in bed comfortably.    I met with Melissa Adams to further discuss diagnosis prognosis, GOC, EOL wishes, disposition and options.  A brief review of her past medical history was completed. Discussed that she has a H/O ESRD requiring HD M/W/F. We reviewed that she has Afib, a poorly functioning liver, and liver lesions. We discussed the concern that these may be metastatic in nature and that possibly her back pain is also related to this.    I introduced Palliative Medicine as specialized medical care for people living with serious illness. It focuses on providing relief from the symptoms and stress of a serious illness. The  goal is to improve quality of life for both the patient and the family.  Melissa Adams is from [place], she moved here to live closer to her daughter, Melissa Adams with whom she lives in an apartment. She is a widow and has two children, a son and a daughter. She has multiple grandchildren and laments on the reality that 3 months ago she lost her 67 year old granddaughter. She expresses how heartbreaking that has been for her.  She has four sisters who are still alive and involved in her life. She is a retired Astronomer. She is a woman of faith and practices within the [employer] denomination.   Prior to one week ago, Melissa Adams was quite functional and able to mobilize up and down 13 steps to and from her apartments 3x weekly to get to HD treatments.   A detailed discussion was had today regarding advanced directives, she has her HCPOA document though does not have a living will. She would be interested in completed this in house.  Concepts specific to code status, artifical feeding and hydration, continued IV antibiotics and rehospitalization was had. Per conversation, Dezyrae herself would not wish for resuscitation, intubation, or any life prolonging measures.   She shares that she saw her own mother go through these things and would not desire this for herself. I introduced and completed a MOST form with her though she would like to review this with her family prior to signing it.  We reviewed the "what if's" of her cancer diagnosis. She shares that she gets joy from living and spending time with her grandchildren. We reviewed that presently information is still being obtained  to further devise a treatment plan.  We plan tomorrow to have a joint conversation with Melissa Adams's two children to further identify goals of care.  Discussed the importance of continued conversation with family and their  medical providers regarding overall plan of care and treatment options, ensuring decisions are within the context of the  patients values and GOCs.  Decision Maker: Melissa Adams (daughter) [phone]  SUMMARY OF RECOMMENDATIONS   DNAR/DNI  Will complete a MOST prior to discharge  Will request Chaplain help with advance directives  Plan for family meeting tomorrow at 11AM  Ongoing PMT support  Code Status/Advance Care Planning: DNAR/DNI   Symptom Management:  Lower Back Pain d/t metastatic disease: - Gabapentin 110m PO TID - Tylenol PRN - Dilaudid 160mIV Q3H PRN - last dose yesterday AM   Palliative Prophylaxis:  Oral care, mobility, pain  Additional Recommendations (Limitations, Scope, Preferences): Treat what is treatable   Psycho-social/Spiritual:  Desire for further Chaplaincy support: NO Additional Recommendations: Education on disease process of metastasis   Prognosis: Unclear presently  Discharge Planning: Will depend on rehabilitation evaluation(s)  Vitals:   07/19/21 2320 07/20/21 0426  BP: (!) 86/45 124/88  Pulse:  90  Resp:  20  Temp:  97.7 F (36.5 C)  SpO2:  100%    Intake/Output Summary (Last 24 hours) at 07/20/2021 0708 Last data filed at 07/20/2021 0600 Gross per 24 hour  Intake 298.47 ml  Output 1000 ml  Net -701.53 ml   Last Weight  Most recent update: 07/19/2021  6:06 PM    Weight  93.4 kg (205 lb 14.6 oz)            Gen:  Elderly AA F in NAD HEENT: moist mucous membranes CV: Regular rate and irregular rhythm PULM: On 2LPM [place] ABD: soft/nontender  EXT: No edema  Neuro: Alert and oriented x3   PPS: 50%   This conversation/these recommendations were discussed with patient primary care team, Dr. DaPietro CassisTime In: 1240 Time Out: 1353 Total Time: 73 Greater than 50%  of this time was spent counseling and coordinating care related to the above assessment and plan.  [facility] Team Cell Phone: [phone] utilize secure chat with additional questions, if there is no response within 30  minutes please call the above phone number  Palliative Medicine Team providers are available by phone from 7am to 7pm daily and can be reached through the team cell phone.  Should this patient require assistance outside of these hours, please call the patient's attending physician.

## 2021-07-20 NOTE — Consult Note (Signed)
Radiation Oncology         [phone] ________________________________  Name: Melissa Adams        MRN: [id number]  Date of Service: 07/17/2021 DOB: [birth year]  ZE:[id number], Milford Cage, NP    REFERRING PHYSICIAN: Dr. Pietro Cassis   DIAGNOSIS: The primary encounter diagnosis was Acute midline low back pain, unspecified whether sciatica present. 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   HISTORY OF PRESENT ILLNESS: Melissa Adams is a 75 y.o. female seen at the request of Dr. Pietro Cassis with [facility] for painful lumbar spine fracture associated with pathologic features.  The patient is newly established with Dr. Lorenso Courier in medical oncology. 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  She underwent resection of this.  Her work-up at that time showed no evidence of metastatic disease and he recommended continued follow-up to rule out recurrence with endoscopy.  She did not require systemic treatment.  More recently however she returned to Dr. Libby Maw attention after she had undergone an MRI of the abdomen on 06/19/2021 showing innumerable enhancing lesions throughout both lobes of the liver concerning for metastatic disease.  We recommended that she undergo biopsy which showed a small focus of moderate to poorly differentiated adenocarcinoma that was difficult to identify a primary site.  She was counseled on the need for further work-up and underwent CT staging on 07/06/2021 that showed a 2.6 cm right lateral breast mass concerning for possible neoplasm with overlying skin thickening right axillary adenopathy small bilateral pulmonary nodules measuring up to 5 mm new from July 2021 raising concerns for metastatic disease and a small to moderate right pleural effusion she had multifocal hepatic metastases and a mild to moderate pathologic  compression fracture at L4 no retropulsion was identified.  She presented from pulmonary's office while being evaluated for her effusion with intractable back pain, upon arrival imaging of the lumbar spine showed mild to moderate pathologic compression fracture at L4, interventional radiology has been consulted to consider vertebral augmentation.  They recommended proceeding with an MRI scan prior to the procedure for more detailed characterizing of her fractured site.  We have been asked to also weigh in on the patient in terms of options of palliative radiotherapy.    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   Cardiomyopathy ([place])    CHF (congestive heart failure) ([place])    Chronic kidney disease    Edema    History of hemodialysis    Hyperlipidemia    Hypertension    Hypotension    on midodrine (as of 06/15/21)   Pulmonary hypertension ([facility])    Recurrent right pleural effusion    As of 06/15/21: s/p thoracenteses 05/05/20, 06/11/20, 08/17/20, 11/18/20, 04/06/21   Sleep apnea    Tricuspid regurgitation        PAST SURGICAL HISTORY: Past Surgical History:  Procedure Laterality Date   A/V FISTULAGRAM Left 05/23/2020   Procedure: A/V FISTULAGRAM;  Surgeon: [name], MD;  Location: [place] CV LAB;  Service: Cardiovascular;  Laterality: Left;   A/V FISTULAGRAM N/A 12/21/2020   Procedure: A/V FISTULAGRAM - Left Upper Arm;  Surgeon: [name], MD;  Location: [facility] INVASIVE CV  LAB;  Service: Cardiovascular;  Laterality: N/A;   ABDOMINAL HYSTERECTOMY     AV FISTULA PLACEMENT Left 11/04/2019   Procedure: Insertion Of Arteriovenous (Av) Gore-Tex Graft Arm;  Surgeon: [name], MD;  Location: [place];  Service: Vascular;  Laterality: Left;   BIOPSY  11/14/2020   Procedure: BIOPSY;  Surgeon: Yetta Flock, MD;   Location: [place];  Service: Gastroenterology;;   BIOPSY  11/16/2020   Procedure: BIOPSY;  Surgeon: Yetta Flock, MD;  Location: [place];  Service: Gastroenterology;;   BIOPSY  02/22/2021   Procedure: BIOPSY;  Surgeon: Irving Copas., MD;  Location: [facility] ENDOSCOPY;  Service: Gastroenterology;;   CARDIAC CATHETERIZATION     CARDIOVERSION N/A 10/26/2019   Procedure: CARDIOVERSION;  Surgeon: [name], MD;  Location: [facility] ENDOSCOPY;  Service: Cardiovascular;  Laterality: N/A;   COLONOSCOPY W/ BIOPSIES AND POLYPECTOMY     COLONOSCOPY WITH PROPOFOL N/A 11/16/2020   Procedure: COLONOSCOPY WITH PROPOFOL;  Surgeon: Yetta Flock, MD;  Location: [place];  Service: Gastroenterology;  Laterality: N/A;   ENDOSCOPIC MUCOSAL RESECTION N/A 02/22/2021   Procedure: ENDOSCOPIC MUCOSAL RESECTION;  Surgeon: Rush Landmark Telford Nab., MD;  Location: [place];  Service: Gastroenterology;  Laterality: N/A;   ESOPHAGOGASTRODUODENOSCOPY (EGD) WITH PROPOFOL Left 11/14/2020   Procedure: ESOPHAGOGASTRODUODENOSCOPY (EGD) WITH PROPOFOL;  Surgeon: Yetta Flock, MD;  Location: [place];  Service: Gastroenterology;  Laterality: Left;   ESOPHAGOGASTRODUODENOSCOPY (EGD) WITH PROPOFOL N/A 02/22/2021   Procedure: ESOPHAGOGASTRODUODENOSCOPY (EGD) WITH PROPOFOL;  Surgeon: Rush Landmark Telford Nab., MD;  Location: [place];  Service: Gastroenterology;  Laterality: N/A;   FISTULA SUPERFICIALIZATION Left 01/21/2020   Procedure: FISTULA SUPERFICIALIZATION OF FISTULA;  Surgeon: [name], MD;  Location: [place];  Service: Vascular;  Laterality: Left;   FISTULA SUPERFICIALIZATION Left 02/24/7061   Procedure: PLICATION OF LEFT UPPER EXTREMITY ARTERIOVENOUS FISTULA;  Surgeon: Waynetta [name], MD;  Location: [place];  Service: Vascular;  Laterality: Left;   GIVENS CAPSULE STUDY N/A 11/16/2020   Procedure: GIVENS CAPSULE STUDY;  Surgeon: Yetta Flock, MD;  Location:  [place];  Service: Gastroenterology;  Laterality: N/A;   HEMOSTASIS CLIP PLACEMENT  02/22/2021   Procedure: HEMOSTASIS CLIP PLACEMENT;  Surgeon: Rush Landmark Telford Nab., MD;  Location: [place];  Service: Gastroenterology;;   INSERTION OF DIALYSIS CATHETER Right 11/04/2019   Procedure: CONVERT TEMPORARY DIALYSIS CATHETER TO TUNNELED DIALYSIS CATHETER Right Internal Jugular.;  Surgeon: [name], MD;  Location: [place];  Service: Vascular;  Laterality: Right;   IR THORACENTESIS ASP PLEURAL SPACE W/IMG GUIDE  08/17/2020   MULTIPLE TOOTH EXTRACTIONS     POLYPECTOMY  11/16/2020   Procedure: POLYPECTOMY;  Surgeon: Yetta Flock, MD;  Location: [place];  Service: Gastroenterology;;   RADIOLOGY WITH ANESTHESIA N/A 06/19/2021   Procedure: MRI WITH ANESTHESIA  ,LIVER WITH AND WITHOUT CONTRAST;  Surgeon: Radiologist, Medication, MD;  Location: [place];  Service: Radiology;  Laterality: N/A;   REVISION OF ARTERIOVENOUS GORETEX GRAFT Left 01/21/2020   Procedure: REVISION OF ARTERIOVENOUS FISTULA WITH SIDE BRANCH LIGATION;  Surgeon: [name], MD;  Location: [place];  Service: Vascular;  Laterality: Left;   SUBMUCOSAL LIFTING INJECTION  02/22/2021   Procedure: SUBMUCOSAL LIFTING INJECTION;  Surgeon: Irving Copas., MD;  Location: [place];  Service: Gastroenterology;;   SUBMUCOSAL TATTOO INJECTION  02/22/2021   Procedure: SUBMUCOSAL TATTOO INJECTION;  Surgeon: Irving Copas., MD;  Location: [place];  Service: Gastroenterology;;   TEE WITHOUT CARDIOVERSION N/A 10/26/2019   Procedure:  TRANSESOPHAGEAL ECHOCARDIOGRAM (TEE);  Surgeon: [name], MD;  Location: [facility] ENDOSCOPY;  Service: Cardiovascular;  Laterality: N/A;   THORACENTESIS     x 2   THORACENTESIS N/A 11/07/2020   Procedure: Mathews Robinsons;  Surgeon: [name], MD;  Location: [facility] ENDOSCOPY;  Service: Pulmonary;  Laterality: N/A;   THORACENTESIS N/A 04/06/2021   Procedure:  THORACENTESIS;  Surgeon: Freddi Starr, MD;  Location: [facility] ENDOSCOPY;  Service: Pulmonary;  Laterality: N/A;   UPPER ESOPHAGEAL ENDOSCOPIC ULTRASOUND (EUS) N/A 02/22/2021   Procedure: UPPER ESOPHAGEAL ENDOSCOPIC ULTRASOUND (EUS);  Surgeon: Irving Copas., MD;  Location: [place];  Service: Gastroenterology;  Laterality: N/A;     FAMILY HISTORY:  Family History  Problem Relation Age of Onset   Seizures Father    CAD Father    Diabetes Sister    Lupus Sister    Hypertension Sister    Colon cancer Neg Hx    Esophageal cancer Neg Hx    Pancreatic cancer Neg Hx    Stomach cancer Neg Hx    Inflammatory bowel disease Neg Hx    Liver disease Neg Hx    Rectal cancer Neg Hx      SOCIAL HISTORY:  reports that she has quit smoking. Her smoking use included cigarettes. She has never used smokeless tobacco. She reports that she does not currently use alcohol. She reports that she does not currently use drugs.   ALLERGIES: Black walnut flavor and Shellfish allergy   MEDICATIONS:  Current Facility-Administered Medications  Medication Dose Route Frequency Provider Last Rate Last Admin   (feeding supplement) PROSource Plus liquid 30 mL  30 mL Oral BID BM Etta Quill, DO   30 mL at 07/19/21 1110   acetaminophen (TYLENOL) tablet 650 mg  650 mg Oral Q6H PRN Etta Quill, DO       Or   acetaminophen (TYLENOL) suppository 650 mg  650 mg Rectal Q6H PRN Etta Quill, DO       albuterol (PROVENTIL) (2.5 MG/3ML) 0.083% nebulizer solution 2.5 mg  2.5 mg Inhalation Q6H PRN Etta Quill, DO       allopurinol (ZYLOPRIM) tablet 300 mg  300 mg Oral QHS Jennette Kettle M, DO   300 mg at 07/19/21 2209   atorvastatin (LIPITOR) tablet 80 mg  80 mg Oral QPM Etta Quill, DO   80 mg at 07/19/21 2021   cinacalcet (SENSIPAR) tablet 30 mg  30 mg Oral Q supper Etta Quill, DO   30 mg at 07/19/21 2021   doxercalciferol (HECTOROL) injection 2 mcg  2 mcg Intravenous Q T,Th,Sa-HD  Ernest Haber, PA-C       ferric citrate (AURYXIA) tablet 420 mg  420 mg Oral TID WC Etta Quill, DO   420 mg at 07/19/21 1206   ferrous sulfate tablet 324 mg  324 mg Oral Daily Etta Quill, DO   324 mg at 07/19/21 1047   furosemide (LASIX) tablet 160 mg  160 mg Oral BID Etta Quill, DO   160 mg at 07/19/21 2020   gabapentin (NEURONTIN) capsule 100 mg  100 mg Oral TID Etta Quill, DO   100 mg at 07/19/21 2209   heparin ADULT infusion 100 units/mL (25000 units/232mL)  1,100 Units/hr Intravenous Continuous Bertis Ruddy, RPH 11 mL/hr at 07/20/21 0040 1,100 Units/hr at 07/20/21 0040   HYDROmorphone (DILAUDID) injection 1 mg  1 mg Intravenous Q3H PRN Terrilee Croak, MD   1 mg at 07/19/21  7672   LORazepam (ATIVAN) tablet 1 mg  1 mg Oral Once PRN Etta Quill, DO       metoprolol succinate (TOPROL-XL) 24 hr tablet 25 mg  25 mg Oral BID Jennette Kettle M, DO   25 mg at 07/18/21 2316   midodrine (PROAMATINE) tablet 10 mg  10 mg Oral Daily Jennette Kettle M, DO   10 mg at 07/19/21 1047   multivitamin (RENA-VIT) tablet 1 tablet  1 tablet Oral QHS Etta Quill, DO   1 tablet at 07/19/21 2209   ondansetron (ZOFRAN) tablet 4 mg  4 mg Oral Q6H PRN Etta Quill, DO       Or   ondansetron [facility]) injection 4 mg  4 mg Intravenous Q6H PRN Etta Quill, DO       vitamin B-12 (CYANOCOBALAMIN) tablet 1,000 mcg  1,000 mcg Oral Daily Alcario Drought, Jared M, DO   1,000 mcg at 07/19/21 1047     REVIEW OF SYSTEMS: On review of systems, the patient's daughter is her healthcare power of attorney and makes all decisions for her for the last several years.  It is unclear the nature of why this has been the case.  I suspect it has to do with her comorbidities and at this time notes indicate that her pain seems to be more manageable.  She has still not been able to walk however per the patient's daughter.  No other specific complaints are noted.    PHYSICAL EXAM:   Unable to assess given  encounter type.  ECOG = 4  0 - Asymptomatic (Fully active, able to carry on all predisease activities without restriction)  1 - Symptomatic but completely ambulatory (Restricted in physically strenuous activity but ambulatory and able to carry out work of a light or sedentary nature. For example, light housework, office work)  2 - Symptomatic, <50% in bed during the day (Ambulatory and capable of all self care but unable to carry out any work activities. Up and about more than 50% of waking hours)  3 - Symptomatic, >50% in bed, but not bedbound (Capable of only limited self-care, confined to bed or chair 50% or more of waking hours)  4 - Bedbound (Completely disabled. Cannot carry on any self-care. Totally confined to bed or chair)  5 - Death   Eustace Pen MM, Creech RH, Tormey DC, et al. [phone]). "Toxicity and response criteria of the [facility] Group". [place] Oncol. 5 (6): 649-55    LABORATORY DATA:  Lab Results  Component Value Date   WBC 8.7 07/17/2021   HGB 11.3 (L) 07/17/2021   HCT 36.9 07/17/2021   MCV 99.2 07/17/2021   PLT 172 07/17/2021   Lab Results  Component Value Date   NA 133 (L) 07/17/2021   K 3.8 07/17/2021   CL 95 (L) 07/17/2021   CO2 23 07/17/2021   Lab Results  Component Value Date   ALT 37 07/17/2021   AST 58 (H) 07/17/2021   ALKPHOS 238 (H) 07/17/2021   BILITOT 1.5 (H) 07/17/2021      RADIOGRAPHY: CT Abdomen Pelvis Wo Contrast  Result Date: 07/08/2021 CLINICAL DATA:  Duodenal neuroendocrine tumor resection, numerous liver masses with unknown primary EXAM: CT CHEST, ABDOMEN AND PELVIS WITHOUT CONTRAST TECHNIQUE: Multidetector CT imaging of the chest, abdomen and pelvis was performed following the standard protocol without IV contrast. COMPARISON:  MRI abdomen dated 06/19/2021. CT abdomen/pelvis dated 11/17/2020. CT chest dated 06/10/2020. FINDINGS: Limited evaluation due to lack  of intravenous contrast administration. CT CHEST  FINDINGS Cardiovascular: Cardiomegaly.  No pericardial effusion. No evidence of thoracic aortic aneurysm. Atherosclerotic calcifications of the aortic arch. Mediastinum/Nodes: No suspicious mediastinal lymphadenopathy. Multiple right axillary nodes measuring up to 12 mm short axis (series 2/image 20). Lungs/Pleura: Small to moderate right pleural effusion, partially loculated. Associated right middle and right lower lobe compressive atelectasis. Multiple scattered small bilateral pulmonary nodules measuring 3-5 mm (for example, series 4/images 30, 34, 56, 62, 63, 74, 76, and 118). These are new from July 2021. No pneumothorax. Musculoskeletal: Asymmetric glandular tissue with a possible underlying 2.6 x 2.3 cm lesion in the right lateral breast (series 2/image 34). Overlying skin thickening. Degenerative changes of the thoracic spine. CT ABDOMEN PELVIS FINDINGS Hepatobiliary: Multifocal hepatic masses throughout both lobes, poorly evaluated on unenhanced CT. Index lesion measures 3.5 cm in the posterior aspect of segment 2 (series 2/image 54). Additional index lesion measures 2.4 cm in segment 6 (series 2/image 71). These are new from December 2021, suggesting metastases. Layering gallstones (series 2/image 71), without associated inflammatory changes. No intrahepatic or extrahepatic duct dilatation. Pancreas: Within normal limits. Spleen: Within normal limits. Adrenals/Urinary Tract: 19 mm left adrenal adenoma (series 2/image 65). Right adrenal gland is within normal limits. 16 mm left lower pole renal cyst (series 2/image 37). Three nonobstructing left renal calculi measuring up to 5 mm in the lower pole (coronal image 93). Two nonobstructing right lower pole renal calculi measuring up to 3 mm (coronal image 90). No hydronephrosis. Bladder is underdistended and poorly evaluated. Stomach/Bowel: Stomach is within normal limits. No evidence of bowel obstruction. Normal appendix (series 2/image 92). No colonic wall  thickening or mass is evident on CT. Vascular/Lymphatic: No evidence of abdominal aortic aneurysm. Atherosclerotic calcifications of the abdominal aorta and branch vessels. No suspicious abdominopelvic lymphadenopathy. Reproductive: Status post hysterectomy. No adnexal masses. Other: Small volume pelvic ascites. Musculoskeletal: Degenerative changes of the lumbar spine. Mild to moderate loss of height involving the L4 vertebral body with mild sclerosis in suspected lytic metastasis along the posterior aspect (sagittal image 108), suggesting pathologic fracture. No retropulsion. IMPRESSION: Limited evaluation due to lack of intravenous contrast administration. Possible 2.6 cm right lateral breast lesion, raising concern for primary breast neoplasm. Overlying skin thickening. Associated right axillary lymphadenopathy. Mammographic correlation is suggested. Small bilateral pulmonary nodules measuring up to 5 mm, new from July 2021, raising concern for metastases. Small to moderate right pleural effusion, partially loculated. Multifocal hepatic metastases throughout both lobes, new from December 2021. Mild to moderate pathologic compression fracture at L4. No retropulsion. Additional ancillary findings as above. Electronically Signed   By: Julian Hy M.D.   On: 07/08/2021 19:50   DG Chest 2 View  Result Date: 07/17/2021 CLINICAL DATA:  Shortness of breath.  Possible lesion. EXAM: CHEST - 2 VIEW COMPARISON:  04/06/2021 FINDINGS: Cardiac enlargement. No vascular congestion. Moderate right pleural effusion. There is right middle lobe consolidation, likely pneumonia. This is progressing since the previous study. Left lung is clear. No pneumothorax. Calcified and tortuous aorta. Degenerative changes in the spine. IMPRESSION: Right middle lobe consolidation with moderate right pleural effusion. Electronically Signed   By: Lucienne Capers M.D.   On: 07/17/2021 19:29   DG Lumbar Spine Complete  Result Date:  07/09/2021 CLINICAL DATA:  Fall 2 days ago, low back and bilateral hip pain EXAM: LUMBAR SPINE - COMPLETE 4+ VIEW COMPARISON:  CT abdomen/pelvis dated H 10/2021 FINDINGS: Five lumbar-type vertebral bodies. Normal lumbar lordosis. Mild to moderate compression fracture  deformity at L4 with associated sclerosis, favoring a pathologic fracture when correlating with recent CT. Otherwise, no evidence of acute fracture or dislocation. Mild multilevel degenerative changes. Visualized bony pelvis appears intact. IMPRESSION: Mild to moderate pathologic compression fracture at L4, better evaluated on recent CT. Electronically Signed   By: Julian Hy M.D.   On: 07/09/2021 19:04   CT Chest Wo Contrast  Result Date: 07/08/2021 CLINICAL DATA:  Duodenal neuroendocrine tumor resection, numerous liver masses with unknown primary EXAM: CT CHEST, ABDOMEN AND PELVIS WITHOUT CONTRAST TECHNIQUE: Multidetector CT imaging of the chest, abdomen and pelvis was performed following the standard protocol without IV contrast. COMPARISON:  MRI abdomen dated 06/19/2021. CT abdomen/pelvis dated 11/17/2020. CT chest dated 06/10/2020. FINDINGS: Limited evaluation due to lack of intravenous contrast administration. CT CHEST FINDINGS Cardiovascular: Cardiomegaly.  No pericardial effusion. No evidence of thoracic aortic aneurysm. Atherosclerotic calcifications of the aortic arch. Mediastinum/Nodes: No suspicious mediastinal lymphadenopathy. Multiple right axillary nodes measuring up to 12 mm short axis (series 2/image 20). Lungs/Pleura: Small to moderate right pleural effusion, partially loculated. Associated right middle and right lower lobe compressive atelectasis. Multiple scattered small bilateral pulmonary nodules measuring 3-5 mm (for example, series 4/images 30, 34, 56, 62, 63, 74, 76, and 118). These are new from July 2021. No pneumothorax. Musculoskeletal: Asymmetric glandular tissue with a possible underlying 2.6 x 2.3 cm lesion in  the right lateral breast (series 2/image 34). Overlying skin thickening. Degenerative changes of the thoracic spine. CT ABDOMEN PELVIS FINDINGS Hepatobiliary: Multifocal hepatic masses throughout both lobes, poorly evaluated on unenhanced CT. Index lesion measures 3.5 cm in the posterior aspect of segment 2 (series 2/image 54). Additional index lesion measures 2.4 cm in segment 6 (series 2/image 71). These are new from December 2021, suggesting metastases. Layering gallstones (series 2/image 71), without associated inflammatory changes. No intrahepatic or extrahepatic duct dilatation. Pancreas: Within normal limits. Spleen: Within normal limits. Adrenals/Urinary Tract: 19 mm left adrenal adenoma (series 2/image 65). Right adrenal gland is within normal limits. 16 mm left lower pole renal cyst (series 2/image 37). Three nonobstructing left renal calculi measuring up to 5 mm in the lower pole (coronal image 93). Two nonobstructing right lower pole renal calculi measuring up to 3 mm (coronal image 90). No hydronephrosis. Bladder is underdistended and poorly evaluated. Stomach/Bowel: Stomach is within normal limits. No evidence of bowel obstruction. Normal appendix (series 2/image 92). No colonic wall thickening or mass is evident on CT. Vascular/Lymphatic: No evidence of abdominal aortic aneurysm. Atherosclerotic calcifications of the abdominal aorta and branch vessels. No suspicious abdominopelvic lymphadenopathy. Reproductive: Status post hysterectomy. No adnexal masses. Other: Small volume pelvic ascites. Musculoskeletal: Degenerative changes of the lumbar spine. Mild to moderate loss of height involving the L4 vertebral body with mild sclerosis in suspected lytic metastasis along the posterior aspect (sagittal image 108), suggesting pathologic fracture. No retropulsion. IMPRESSION: Limited evaluation due to lack of intravenous contrast administration. Possible 2.6 cm right lateral breast lesion, raising concern for  primary breast neoplasm. Overlying skin thickening. Associated right axillary lymphadenopathy. Mammographic correlation is suggested. Small bilateral pulmonary nodules measuring up to 5 mm, new from July 2021, raising concern for metastases. Small to moderate right pleural effusion, partially loculated. Multifocal hepatic metastases throughout both lobes, new from December 2021. Mild to moderate pathologic compression fracture at L4. No retropulsion. Additional ancillary findings as above. Electronically Signed   By: Julian Hy M.D.   On: 07/08/2021 19:50   CT Hip Right Wo Contrast  Result Date: 07/10/2021 CLINICAL  DATA:  Status post trauma. EXAM: CT OF THE RIGHT HIP WITHOUT CONTRAST TECHNIQUE: Multidetector CT imaging of the right hip was performed according to the standard protocol. Multiplanar CT image reconstructions were also generated. COMPARISON:  None. FINDINGS: Bones/Joint/Cartilage There is no evidence of acute fracture or dislocation. No lytic or blastic lesions are identified. There is no evidence of cortical destruction or acute periosteal reaction. Degenerative changes are seen in the form joint space narrowing and acetabular sclerosis. Bony spurring is also seen along the lateral aspect of the right acetabulum. Ligaments Suboptimally assessed by CT. Muscles and Tendons Unremarkable. Soft tissues Mild soft tissue swelling and subcutaneous inflammatory fat stranding is seen along the lateral aspect of the right hip. IMPRESSION: Degenerative changes and lateral soft tissue swelling without an acute osseous abnormality. Electronically Signed   By: Virgina Norfolk M.D.   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 Please perform ultrasound-guided liver lesion biopsy for tissue diagnostic purposes. EXAM: ULTRASOUND GUIDED LIVER LESION BIOPSY COMPARISON:  CT of  the chest, abdomen and pelvis-07/06/2021; abdominal MRI-06/19/2021 MEDICATIONS: None ANESTHESIA/SEDATION: Fentanyl 50 mcg IV; Versed 0.5 mg IV Total Moderate Sedation time:  18 Minutes. The patient's level of consciousness and vital signs were monitored continuously by radiology nursing throughout the procedure under my direct supervision. COMPLICATIONS: None immediate. PROCEDURE: Informed written consent was obtained from the patient after a discussion of the risks, benefits and alternatives to treatment. The patient understands and consents the procedure. A timeout was performed prior to the initiation of the procedure. Ultrasound scanning was performed of the right upper abdominal quadrant demonstrates multiple mixed echogenic though predominantly hypoechoic lesions and masses scattered throughout both the right and left lobes of the liver. A dominant approximately 3.1 x 2.9 cm hypoechoic mass within the right lobe of the liver (image 13) correlating with the lesion seen on preceding abdominal CT image 60, series 2 was targeted for biopsy given lesion location and sonographic window. The procedure was planned. The right upper abdominal quadrant was prepped and draped in the usual sterile fashion. The overlying soft tissues were anesthetized with 1% lidocaine with epinephrine. A 17 gauge, 6.8 cm co-axial needle was advanced into a peripheral aspect of the lesion. This was followed by 6 core biopsies with an 18 gauge core device under direct ultrasound guidance. The coaxial needle tract was embolized with a small amount of Gel-Foam slurry and superficial hemostasis was obtained with manual compression. Post procedural scanning was negative for definitive area of hemorrhage or additional complication. A dressing was placed. The patient tolerated the procedure well without immediate post procedural complication. IMPRESSION: Technically successful ultrasound guided core needle biopsy of indeterminate lesion/mass  within the right lobe of the liver. Electronically Signed   By: Sandi Mariscal M.D.   On: 07/09/2021 14:32   DG HIP UNILAT WITH PELVIS 2-3 VIEWS LEFT  Result Date: 07/09/2021 CLINICAL DATA:  Low back pain bilateral hip pain post fall EXAM: DG HIP (WITH OR WITHOUT PELVIS) 2-3V LEFT COMPARISON:  CT 07/06/2021 FINDINGS: Left pubic rami appear intact. No fracture or malalignment. Mild degenerative changes. IMPRESSION: No acute osseous abnormality Electronically Signed   By: Donavan Foil M.D.   On: 07/09/2021 19:03   DG HIP UNILAT WITH PELVIS 2-3 VIEWS RIGHT  Result Date: 07/09/2021 CLINICAL DATA:  Hip pain post fall EXAM: DG HIP (WITH OR WITHOUT PELVIS) 2-3V RIGHT COMPARISON:  CT  07/06/2021 FINDINGS: SI joints are non widened. Pubic symphysis appears intact. Right femoral head projects in joint. There are mild degenerative changes. IMPRESSION: No definite acute osseous abnormality. Electronically Signed   By: Donavan Foil M.D.   On: 07/09/2021 19:04       IMPRESSION/PLAN: 1. Metastatic adenocarcinoma of unknown primary.  The patient continues to be undergoing a work-up for what is possibly metastatic breast cancer.  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  Dr. Lorenso Courier plans for her to undergo mammogram and biopsy, it appears that this has to be done in the outpatient setting.  Regarding her L4 compression fracture we we will follow-up with the decision making with interventional radiology.  We discussed that based on the MRI scan she may undergo vertebral augmentation with kyphoplasty plus or minus osteochondral ablation.  Even if she undergoes this procedure, Dr. Lisbeth Renshaw would recommend radiotherapy in the palliative setting.  We would meet with her in approximately a week's time in the outpatient setting to discuss this however if her MRI shows concerns for compromise of her spinal canal or if the results  do not allow for kyphoplasty, we would be happy to discuss palliative radiotherapy more urgently during her hospitalization.  We will follow-up with the results of her MRI scan next in order to move forward..  I did spend time talking with her daughter about the risk, benefits, short and long-term effects of radiotherapy and if it were to be given that it would be a 10 fraction course she is aware of the need for simulation prior to proceeding which we will coordinate depending on urgency of our treatment.  In a visit lasting 60 minutes, greater than 50% of the time was spent f by phone and floor time discussing the patient's condition, in preparation for the discussion, and coordinating the patient's care.      Carola Rhine, [facility]   **Disclaimer: This note was dictated with voice recognition software. Similar sounding words can inadvertently be transcribed and this note may contain transcription errors which may not have been corrected upon publication of note.**

## 2021-07-20 NOTE — Progress Notes (Signed)
Brief Oncology Progress Note  S: Melissa Adams is a 75 year old female with medical history significant for metastatic adenocarcinoma of unclear etiology (likely breast) who presented with intractable back pain and was found to have a compression fracture of her L4 vertebrae.   O:  Vitals:   07/20/21 0426 07/20/21 0800  BP: 124/88 (!) 97/47  Pulse: 90 (!) 146  Resp: 20 20  Temp: 97.7 F (36.5 C) 98.7 F (37.1 C)  SpO2: 100% 100%   CMP Latest Ref Rng & Units 07/17/2021 07/09/2021 06/19/2021  Glucose 70 - 99 mg/dL 86 81 89  BUN 8 - 23 mg/dL 29(H) 63(H) 48(H)  Creatinine 0.44 - 1.00 mg/dL 5.81(H) 6.57(H) 5.90(H)  Sodium 135 - 145 mmol/L 133(L) 135 133(L)  Potassium 3.5 - 5.1 mmol/L 3.8 4.2 5.4(H)  Chloride 98 - 111 mmol/L 95(L) 91(L) 96(L)  CO2 22 - 32 mmol/L 23 28 -  Calcium 8.9 - 10.3 mg/dL 9.6 9.4 -  Total Protein 6.5 - 8.1 g/dL 6.7 7.1 -  Total Bilirubin 0.3 - 1.2 mg/dL 1.5(H) 1.2 -  Alkaline Phos 38 - 126 U/L 238(H) 278(H) -  AST 15 - 41 U/L 58(H) 54(H) -  ALT 0 - 44 U/L 37 49(H) -   CBC Latest Ref Rng & Units 07/17/2021 07/09/2021 06/19/2021  WBC 4.0 - 10.5 K/uL 8.7 7.2 -  Hemoglobin 12.0 - 15.0 g/dL 11.3(L) 11.5(L) 14.3  Hematocrit 36.0 - 46.0 % 36.9 36.7 42.0  Platelets 150 - 400 K/uL 172 219 -    A/P:  #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 --Liver biopsy does not provide adequate tissue for further diagnosis. --Unable to obtain mammogram and breast biopsy while in house.  We will set this up in the outpatient setting --If feasible recommend IR obtain tissue during the kyphoplasty to help assist [place] in the diagnosis --Patient has congestive heart failure, ESRD, and cirrhosis.  Given her multiple comorbidities she would be a very poor candidate for cancer treatment --Recommend in-house consult to palliative  care --Oncology will continue to follow.  #Pathological Compression Fracture of L4 -- Agree with consult IR for consideration of kyphoplasty.  If feasible please obtain tissue during procedure.  This will assist with diagnosis. --Agree with MRI of the spine and consult to radiation oncology --Defer pain management to primary team   Ledell Peoples, MD Department of Hematology/Oncology [place] [facility] Phone: [phone] Pager: [phone] Email: [email or web address]

## 2021-07-20 NOTE — Anesthesia Postprocedure Evaluation (Signed)
Anesthesia Post Note  Patient: Melissa Adams  Procedure(s) Performed: MRI WITH ANESTHESIA LOWER SPINE WITHOUT CONTRAST     Patient location during evaluation: PACU Anesthesia Type: General Level of consciousness: awake and alert Pain management: pain level controlled Vital Signs Assessment: post-procedure vital signs reviewed and stable Respiratory status: spontaneous breathing, nonlabored ventilation, respiratory function stable and patient connected to nasal cannula oxygen Cardiovascular status: blood pressure returned to baseline and stable Postop Assessment: no apparent nausea or vomiting Anesthetic complications: no   No notable events documented.  Last Vitals:  Vitals:   07/20/21 1152 07/20/21 1208  BP: 95/73 111/72  Pulse: 90 97  Resp: 15 16  Temp:    SpO2: 93% 100%    Last Pain:  Vitals:   07/20/21 1208  TempSrc:   PainSc: 0-No pain                 Puanani Gene,W. EDMOND

## 2021-07-20 NOTE — Transfer of Care (Signed)
Immediate Anesthesia Transfer of Care Note  Patient: Melissa Adams  Procedure(s) Performed: MRI WITH ANESTHESIA LOWER SPINE WITHOUT CONTRAST  Patient Location: PACU  Anesthesia Type:General  Level of Consciousness: awake, patient cooperative and responds to stimulation  Airway & Oxygen Therapy: Patient Spontanous Breathing and Patient connected to nasal cannula oxygen  Post-op Assessment: Report given to RN and Post -op Vital signs reviewed and stable  Post vital signs: Reviewed and stable  Last Vitals:  Vitals Value Taken Time  BP 105/60 07/20/21 1139  Temp 36.2 C 07/20/21 1139  Pulse 70 07/20/21 1145  Resp 16 07/20/21 1145  SpO2 93 % 07/20/21 1145  Vitals shown include unvalidated device data.  Last Pain:  Vitals:   07/20/21 0907  TempSrc:   PainSc: 0-No pain         Complications: No notable events documented.

## 2021-07-20 NOTE — Progress Notes (Signed)
PROGRESS NOTE  Melissa Adams  DOB: May 20, 1946  PCP: [name], NP IDH:[id number]  DOA: 07/17/2021  LOS: 1 day  Hospital Day: 4   Chief complaint: Back pain  Brief narrative: Melissa Adams is a 75 y.o. 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 Patient follows up with oncologist Dr. Lorenso Courier.  She had an MRI of the abdomen under anesthesia done on 06/19/2021 which showed innumerable enhancing lesions throughout both lobes of liver concerning for metastatic disease. On 06/28/2021, she was seen by Dr. Lorenso Courier in the office and had a CT abdomen and pelvis done on 8/12.  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  She also had a small to moderate right pleural effusion for which she sees pulmonologist Dr. Erin Fulling.  She had a thoracentesis in May which did not show malignant cells and cytology. On 8/15, she underwent a liver biopsy that showed adenocarcinoma of unknown primary. On 8/23, patient went to Dr. Terrill Mohr office for follow-up.  She remains in excruciating pain in her lower back over the past few weeks because of which she has not been able to walk on her own. Because of the severity of her pain, she was sent to the ED for inpatient management. Chest x-ray showed right middle lobe consolidation with moderate right pleural effusion. Patient was admitted to hospitalist service for further evaluation management  Subjective: Patient was seen and examined this afternoon.  Not in distress.  Pain controlled.  Underwent MRI lumbar spine under anesthesia today. Noted the patient was significantly hypotensive to 70s last night.  Blood pressure improved this morning.    Assessment/Plan: Intractable right-sided lower back pain Pathological fracture of L4 vertebrae due to metastatic disease -8/26, underwent MRI of lumbar spine under anesthesia with C needed because of claustrophobia.  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  Also has multilevel moderate to severe neural foraminal narrowing in the lumbar region.  -Discussed with oncologist Dr. Lorenso Courier, as well as IR.  Patient is a poor candidate for open surgical intervention by neurosurgery.  IR to attempt vertebroplasty on Monday for pain control. -Continue pain control with IV Dilaudid and oral oxycodone..  Metastatic adenocarcinoma of unknown primary -Shown on liver biopsy from 8/15. -Unable to go for previously planned outpatient breast biopsy because patient is hospitalized.  IR to try vertebral biopsy at the time of kyphoplasty.  ESRD-HD-MWF -Nephrology consulted -Continue midodrine  Chronic systolic CHF Cardiogenic liver cirrhosis -Last EF 30 to 35%.  Patient was on Toprol-XL as result high dose of Lasix.  With poor oral intake and intermittent hypotension, I stopped Lasix this morning.  Continue Toprol at a lower dose to prevent RVR.  A. fib  -Continue Toprol at a lower dose.  Eliquis on hold.  On heparin drip for possible vertebroplasty on Monday.  Mobility: PT eval after pain control Code Status:   Code Status: DNR  Nutritional status: Body mass index is 36.48 kg/m.     Diet:  Diet Order             Diet renal with fluid restriction Fluid restriction: 1200 mL Fluid; Room service appropriate? Yes;  Fluid consistency: Thin  Diet effective now                  DVT prophylaxis:     Antimicrobials: None Fluid: None Consultants: Oncology, IR Family Communication: None at bedside  Status is: Inpatient  Remains inpatient  appropriate because: Intractable pain  Dispo: The patient is from: Home              Anticipated d/c is to:  Pending vertebroplasty on monday              Patient currently is not medically stable to d/c.   Difficult to place patient No     Infusions:   heparin 1,100 Units/hr (07/20/21 1127)    Scheduled Meds:  (feeding supplement) PROSource Plus  30 mL Oral BID BM   allopurinol  300 mg Oral QHS   atorvastatin  80 mg Oral QPM   chlorhexidine       cinacalcet  30 mg Oral Q supper   doxercalciferol  2 mcg Intravenous Q T,Th,Sa-HD   ferric citrate  420 mg Oral TID WC   ferrous sulfate  324 mg Oral Daily   gabapentin  100 mg Oral TID   lidocaine       metoprolol tartrate  12.5 mg Oral BID   midodrine  10 mg Oral Daily   multivitamin  1 tablet Oral QHS   vitamin B-12  1,000 mcg Oral Daily    Antimicrobials: Anti-infectives (From admission, onward)    None       PRN meds: acetaminophen **OR** acetaminophen, albuterol, HYDROmorphone (DILAUDID) injection, lidocaine, LORazepam, ondansetron **OR** ondansetron (ZOFRAN) IV   Objective: Vitals:   07/20/21 1208 07/20/21 1233  BP: 111/72 96/62  Pulse: 97 (!) 110  Resp: 16 16  Temp:  98.7 F (37.1 C)  SpO2: 100% 100%    Intake/Output Summary (Last 24 hours) at 07/20/2021 1633 Last data filed at 07/20/2021 1500 Gross per 24 hour  Intake 767.27 ml  Output 1000 ml  Net -232.73 ml   Filed Weights   07/19/21 1545 07/20/21 0840  Weight: 93.4 kg 93.4 kg   Weight change:  Body mass index is 36.48 kg/m.   Physical Exam: General exam: Pleasant, elderly African-American female.  Pain controlled Skin: No rashes, lesions or ulcers. HEENT: Atraumatic, normocephalic, no obvious bleeding Lungs: Shallow respiratory effort probably because of sedation. CVS: Regular rate and rhythm GI/Abd soft, nontender, nondistended, bowel sound present CNS: Partially sedated with pain meds.  Able to follow commands when awake. Psychiatry:  Depressed look Extremities: No pedal edema, no calf tenderness  Data Review: I have personally reviewed the laboratory data and studies available.  Recent Labs  Lab 07/17/21 1847 07/20/21 0802 07/20/21 0914  WBC 8.7 8.2  --   NEUTROABS 6.9  --   --   HGB 11.3* 10.3* 10.2*  HCT 36.9 33.4* 30.0*  MCV 99.2 99.1  --   PLT 172 91*  --     Recent Labs  Lab 07/17/21 1847 07/20/21 0914  NA 133* 140  K 3.8 3.8  CL 95* 100  CO2 23  --   GLUCOSE 86 86  BUN 29* 31*  CREATININE 5.81* 4.80*  CALCIUM 9.6  --      F/u labs ordered Unresulted Labs (From admission, onward)     Start     Ordered   07/21/21 0500  APTT  Daily,   R     Question:  Specimen collection method  Answer:  Lab=Lab collect   07/19/21 1448   07/21/21 0500  Heparin level (unfractionated)  Daily,   R     Question:  Specimen collection method  Answer:  Lab=Lab collect  See Hyperspace for full Linked Orders Report.   07/19/21 1448   07/20/21 1700  APTT  Once-Timed,   TIMED        07/20/21 1251   07/20/21 1700  Heparin level (unfractionated)  Once-Timed,   TIMED       Question:  Specimen collection method  Answer:  Lab=Lab collect   07/20/21 1252   07/20/21 1557  Phosphorus  Add-on,   AD       Question:  Specimen collection method  Answer:  Lab=Lab collect   07/20/21 1556   07/20/21 0500  CBC  Daily,   R     Question:  Specimen collection method  Answer:  Lab=Lab collect  See Hyperspace for full Linked Orders Report.   07/19/21 1448            Signed, Terrilee Croak, MD [facility] 07/20/2021

## 2021-07-20 NOTE — Anesthesia Procedure Notes (Signed)
Procedure Name: Intubation Date/Time: 07/20/2021 10:53 AM Performed by: Betha Loa, CRNA Pre-anesthesia Checklist: Patient identified, Emergency Drugs available, Suction available and Patient being monitored Patient Re-evaluated:Patient Re-evaluated prior to induction Oxygen Delivery Method: Circle System Utilized Preoxygenation: Pre-oxygenation with 100% oxygen Induction Type: IV induction Ventilation: Mask ventilation without difficulty Laryngoscope Size: Mac and 3 Grade View: Grade II Tube type: Oral Tube size: 7.5 mm Number of attempts: 1 Airway Equipment and Method: Stylet and Oral airway Placement Confirmation: ETT inserted through vocal cords under direct vision, positive ETCO2 and breath sounds checked- equal and bilateral Tube secured with: Tape Dental Injury: Teeth and Oropharynx as per pre-operative assessment

## 2021-07-21 DIAGNOSIS — Z515 Encounter for palliative care: Secondary | ICD-10-CM | POA: Diagnosis not present

## 2021-07-21 DIAGNOSIS — M4856XA Collapsed vertebra, not elsewhere classified, lumbar region, initial encounter for fracture: Secondary | ICD-10-CM | POA: Diagnosis not present

## 2021-07-21 DIAGNOSIS — Z7189 Other specified counseling: Secondary | ICD-10-CM | POA: Diagnosis not present

## 2021-07-21 DIAGNOSIS — Z66 Do not resuscitate: Secondary | ICD-10-CM | POA: Diagnosis not present

## 2021-07-21 LAB — APTT
aPTT: 131 seconds — ABNORMAL HIGH (ref 24–36)
aPTT: 62 seconds — ABNORMAL HIGH (ref 24–36)

## 2021-07-21 LAB — CBC
HCT: 34.1 % — ABNORMAL LOW (ref 36.0–46.0)
Hemoglobin: 10.6 g/dL — ABNORMAL LOW (ref 12.0–15.0)
MCH: 31.3 pg (ref 26.0–34.0)
MCHC: 31.1 g/dL (ref 30.0–36.0)
MCV: 100.6 fL — ABNORMAL HIGH (ref 80.0–100.0)
Platelets: 113 10*3/uL — ABNORMAL LOW (ref 150–400)
RBC: 3.39 MIL/uL — ABNORMAL LOW (ref 3.87–5.11)
RDW: 17.5 % — ABNORMAL HIGH (ref 11.5–15.5)
WBC: 6.7 10*3/uL (ref 4.0–10.5)
nRBC: 0 % (ref 0.0–0.2)

## 2021-07-21 LAB — HEPARIN LEVEL (UNFRACTIONATED): Heparin Unfractionated: >1.1 IU/mL — ABNORMAL HIGH (ref 0.30–0.70)

## 2021-07-21 MED ORDER — FERROUS SULFATE 325 (65 FE) MG PO TABS
325.0000 mg | ORAL_TABLET | Freq: Every day | ORAL | Status: DC
  Administered 2021-07-21 – 2021-07-31 (×10): 325 mg via ORAL
  Filled 2021-07-21 (×9): qty 1

## 2021-07-21 NOTE — Progress Notes (Signed)
ANTICOAGULATION CONSULT NOTE - Follow Up Consult  Pharmacy Consult for IV Heparin Indication: atrial fibrillation  Allergies  Allergen Reactions   Black Walnut Flavor Hives   Shellfish Allergy Itching    Makes throat itch     Patient Measurements: Height: 5\' 3"  (160 cm) Weight: 93.4 kg (205 lb 14.6 oz) IBW/kg (Calculated) : 52.4 Heparin Dosing Weight: 73 kg  Vital Signs: Temp: 98.7 F (37.1 C) (08/27 0325) Temp Source: Oral (08/27 0325) BP: 106/64 (08/27 0325) Pulse Rate: 99 (08/27 0325)  Labs: Recent Labs    07/20/21 0802 07/20/21 0914 07/20/21 1700 07/21/21 0325  HGB 10.3* 10.2*  --  10.6*  HCT 33.4* 30.0*  --  34.1*  PLT 91*  --   --  113*  APTT  --   --  134* 131*  HEPARINUNFRC  --   --  >1.10* >1.10*  CREATININE  --  4.80*  --   --     Estimated Creatinine Clearance: 11.2 mL/min (A) (by C-G formula based on SCr of 4.8 mg/dL (H)).  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 Pt has hx of atrial fibrillation, for which she was on apixaban PTA (last dose at 1100 on 8/25). Pt was transitioned to heparin infusion for planned vertebroplasty; heparin infusion was held for procedure with radiation oncology today, then restarted ~1130 AM today.  aPTT and heparin level ~5.5 hrs after  heparin infusion was restarted at 1100 units/hr were 134 sec (above the goal range) and >1.10, respectively. H/H 10.2/30.0, plt 91 (plt trending down, will monitor closely). Per RN, no issues with IV or bleeding observed.  8/27 AM update:  aPTT remains elevated Liver issues likely playing some part in aPTT elevation  Goal of Therapy:  Heparin level 0.3-0.7 units/ml aPTT 66-102 seconds Monitor platelets by anticoagulation protocol: Yes   Plan:  Dec heparin to 600 units/hr 1400 aPTT  Narda Bonds, PharmD, BCPS Clinical Pharmacist Phone: [phone]

## 2021-07-21 NOTE — Progress Notes (Addendum)
Palliative Medicine Inpatient Follow Up Note  Consulting Provider: Terrilee Croak, MD   Reason for consult:   [name] Consult  Reason for Consult? GOC    HPI:  Per intake H&P --> Melissa Adams is a 75 y.o. 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 As of July 2022 (+) innumerable enhancing lesions throughout both lobes of liver concerning for metastatic disease. 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  Has been experiencing excruciating pain.    Palliative care has been asked to get involved to further address goals of care and symptom management.   Today's Discussion (07/21/2021):  *Please note that this is a verbal dictation therefore any spelling or grammatical errors are due to the "[employer] One" system interpretation.  Family Meeting:  Participants: Melissa and Melissa Adams  Providers: Burney Gauze, [place]: I met with Melissa Adams and her family members this afternoon.  I introduced Palliative Medicine as specialized medical care for people living with serious illness. It focuses on providing relief from the symptoms and stress of a serious illness. The goal is to improve quality of life for both the patient and the family.   Patient's daughter shared with me a summary of what has been going on in the past week and a half pertaining to her mother's back pain.  She shares that firstly she is aware that her mother has an abnormality in her breast which needs a biopsy.  She goes on to tell me that a biopsy of her liver has been completed though the results have not yet been revealed.    Melissa shares they"are not sure what is causing the back pain".  Patty endorses that the back pain was so severe that he could not even touch surely without her being in excruciating discomfort.  Due to this he required surely going to multiple different hospitals and care providers in an effort to get some relief.  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  We reviewed that it is important to better understand that so that a treatment plan can be devised.  We then reviewed patient's advance care directives which were completed at bedside and will just need motorization.  We additionally completed a MOST form with the below wishes:  Cardiopulmonary Resuscitation: Do Not Attempt Resuscitation (DNR/No CPR)  Medical Interventions: Limited Additional Interventions: Use medical treatment, IV fluids and cardiac monitoring as indicated, DO NOT USE intubation or mechanical ventilation. May consider use of less invasive airway support such as BiPAP or CPAP. Also provide comfort measures. Transfer to the hospital if indicated. Avoid intensive care.   Antibiotics: Antibiotics if indicated  IV Fluids: IV fluids if indicated  Feeding Tube: Feeding tube for a defined trial period   From a symptom perspective Sincere expresses that her back pain is improving.  We reviewed the plan for a kyphoplasty on Monday which she and her family are all in agreement with.  We concluded that at this time there is additional information that needs to be obtained to further determine the next  course of action.  Patient's family had requested an update from Dr. Pietro Cassis and Dr. Lorenso Courier for which I was able to secure chat each of them.   [name] is clear that she would like to continue measures which can improve her quality of life and would be accepting if something were "incurable" though until  then she would like to continue to "fight".  Discussed the importance of continued conversation with family and their  medical providers regarding overall plan of care and treatment options, ensuring decisions are within the context of the patients values and GOCs.  Provided  "Hard Choices for [employer]" booklet.   Questions and concerns addressed   Objective Assessment: Vital Signs Vitals:   07/21/21 0748 07/21/21 1100  BP: (!) 90/53 107/61  Pulse: 89 87  Resp:  19  Temp:  98.5 F (36.9 C)  SpO2:  99%    Intake/Output Summary (Last 24 hours) at 07/21/2021 1416 Last data filed at 07/21/2021 0400 Gross per 24 hour  Intake 386.68 ml  Output --  Net 386.68 ml   Last Weight  Most recent update: 07/20/2021  8:42 AM    Weight  93.4 kg (205 lb 14.6 oz)            Gen:  Elderly AA F in NAD HEENT: moist mucous membranes CV: Regular rate and irregular rhythm PULM: On 2LPM [place] ABD: soft/nontender  EXT: No edema  Neuro: Alert and oriented x3  SUMMARY OF RECOMMENDATIONS   DNAR/DNI   MOST Completed, paper copy placed onto the chart electric copy can be found in [facility]  DNR Form Completed, paper copy placed onto the chart electric copy can be found in [place] on chart just need to be notarized   Ongoing PMT support   Code Status/Advance Care Planning: DNAR/DNI   Symptom Management:  Lower Back Pain d/t metastatic disease: - Gabapentin 122m PO TID - Tylenol PRN - Dilaudid 178mIV Q3H PRN - last dose yesterday AM  Time Spent:80 Greater than 50% of the time was spent in counseling and coordination of care ______________________________________________________________________________________ [facility] Team Cell Phone: [phone] utilize secure chat with additional questions, if there is no response within 30 minutes please call the above phone number  Palliative Medicine Team providers are  available by phone from 7am to 7pm daily and can be reached through the team cell phone.  Should this patient require assistance outside of these hours, please call the patient's attending physician.

## 2021-07-21 NOTE — Progress Notes (Signed)
ANTICOAGULATION CONSULT NOTE - Follow Up Consult  Pharmacy Consult for IV Heparin Indication: atrial fibrillation  Allergies  Allergen Reactions   Black Walnut Flavor Hives   Shellfish Allergy Itching    Makes throat itch     Patient Measurements: Height: 5\' 3"  (160 cm) Weight: 93.4 kg (205 lb 14.6 oz) IBW/kg (Calculated) : 52.4 Heparin Dosing Weight: 73 kg  Vital Signs: Temp: 98.4 F (36.9 C) (08/27 1442) Temp Source: Oral (08/27 1442) BP: 90/48 (08/27 1442) Pulse Rate: 92 (08/27 1442)  Labs: Recent Labs    07/20/21 0802 07/20/21 0914 07/20/21 1700 07/21/21 0325 07/21/21 1444  HGB 10.3* 10.2*  --  10.6*  --   HCT 33.4* 30.0*  --  34.1*  --   PLT 91*  --   --  113*  --   APTT  --   --  134* 131* 62*  HEPARINUNFRC  --   --  >1.10* >1.10*  --   CREATININE  --  4.80*  --   --   --     Estimated Creatinine Clearance: 11.2 mL/min (A) (by C-G formula based on SCr of 4.8 mg/dL (H)).  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 Pt has hx of atrial fibrillation, for which she was on apixaban PTA (last dose at 1100 on 8/25). Pt was transitioned to heparin infusion for planned vertebroplasty.    APTT level 62 seconds which is subtherapeutic. Liver mets may be affecting aPTT. HL not  correlating. H/H, plt stable.   Goal of Therapy:  Heparin level 0.3-0.7 units/ml aPTT 66-102 seconds Monitor platelets by anticoagulation protocol: Yes   Plan:  Increase heparin slightly to 700 units/h Recheck heparin level and aPTT in am   Arrie Senate, PharmD, BCPS, [facility] Clinical Pharmacist [phone] Please check AMION for all [place] numbers 07/21/2021

## 2021-07-21 NOTE — Progress Notes (Signed)
[facility] Progress Note   Subjective:  Patient seen and examined at bedside.  Reports minimal pain.  Denies CP, SOB, n/v/d, abdominal pain and dizziness.     Objective Vitals:   07/21/21 0325 07/21/21 0742 07/21/21 0748 07/21/21 1100  BP: 106/64 (!) 83/52 (!) 90/53 107/61  Pulse: 99 92 89 87  Resp: 18 20  19   Temp: 98.7 F (37.1 C) 98.5 F (36.9 C)  98.5 F (36.9 C)  TempSrc: Oral Oral  Oral  SpO2: 94% 98%  99%  Weight:      Height:       Physical Exam General:chronically ill appearing, pleasant female in NAD Heart:RRR, no mrg Lungs:CTAB breath sounds decreased on R Abdomen:soft, NTND Extremities: trace LE edema Dialysis Access: LU AVF +b/t   Filed Weights   07/19/21 1545 07/20/21 0840  Weight: 93.4 kg 93.4 kg    Intake/Output Summary (Last 24 hours) at 07/21/2021 1338 Last data filed at 07/21/2021 0400 Gross per 24 hour  Intake 386.68 ml  Output --  Net 386.68 ml    Additional Objective Labs: Basic Metabolic Panel: Recent Labs  Lab 07/17/21 1847 07/20/21 0914 07/20/21 1700  NA 133* 140  --   K 3.8 3.8  --   CL 95* 100  --   CO2 23  --   --   GLUCOSE 86 86  --   BUN 29* 31*  --   CREATININE 5.81* 4.80*  --   CALCIUM 9.6  --   --   PHOS  --   --  4.8*   Liver Function Tests: Recent Labs  Lab 07/17/21 1847  AST 58*  ALT 37  ALKPHOS 238*  BILITOT 1.5*  PROT 6.7  ALBUMIN 3.6    CBC: Recent Labs  Lab 07/17/21 1847 07/20/21 0802 07/20/21 0914 07/21/21 0325  WBC 8.7 8.2  --  6.7  NEUTROABS 6.9  --   --   --   HGB 11.3* 10.3* 10.2* 10.6*  HCT 36.9 33.4* 30.0* 34.1*  MCV 99.2 99.1  --  100.6*  PLT 172 91*  --  113*   CBG: Recent Labs  Lab 07/19/21 2211  GLUCAP 166*    Studies/Results: MR LUMBAR SPINE WO CONTRAST  Result Date: 07/20/2021 CLINICAL DATA:  Low back pain EXAM: MRI LUMBAR SPINE WITHOUT CONTRAST TECHNIQUE: Multiplanar, multisequence MR imaging of the lumbar spine was performed. No intravenous contrast was  administered. COMPARISON:  No prior MRI. FINDINGS: Evaluation is limited by artifact from the degree of edema in the subcutaneous tissues. Segmentation: Standard. Alignment:  Physiologic. Vertebrae: Diffusely decreased T1 and heterogeneously increased T2 signal within the L4 vertebral body and pedicles. Vertebral body height loss, mild bowing of the posterior cortex inferiorly. Additional smaller T1 hypointense, T2 hyperintense lesions in L2 (series 6, images 7 and 9), L3 (series 6, image 9), L5 (series 6, images 7 and 10), and S1 (series 6, images 6 and 10). Multilevel endplate degenerative changes. Conus medullaris and cauda equina: Conus extends to the L1 level. Conus and cauda equina appear normal. No epidural extension of tumor. Paraspinal and other soft tissues: Multiple T1 and T2 hyperintense lesions in the liver and kidneys. Edema in the subcutaneous tissues. Disc levels: T12-L1: No significant disc bulge. Mild facet arthropathy. No spinal canal stenosis or neural foraminal narrowing. L1-L2: Mild disc bulge. Moderate facet arthropathy. No spinal canal stenosis. No neural foraminal narrowing. L2-L3: Disc height loss with broad-based disc bulge. Moderate severe facet arthropathy and ligamentum flavum hypertrophy.  Mild spinal canal stenosis. Moderate right neural foraminal narrowing. L3-L4: Broad-based disc bulge, somewhat eccentric to the right. Moderate facet arthropathy with ligamentum flavum hypertrophy. No spinal canal stenosis. Mild left and severe right neural foraminal narrowing. L4-L5: Mild narrowing of the spinal canal secondary to L4 pathologic compression fracture. Broad-based disc bulge. Severe facet arthropathy. Ligamentum flavum hypertrophy. Severe spinal canal stenosis. Severe bilateral neural foraminal narrowing. L5-S1: Broad-based disc bulge. Severe facet arthropathy. Ligamentum flavum hypertrophy. No spinal canal stenosis. Moderate bilateral neural foraminal narrowing. IMPRESSION: 1.  Evaluation is somewhat limited by artifact caused by the degree of edema in the subcutaneous tissues. Within this limitation, diffusely abnormal marrow signal within the L4 vertebral body with vertebral body height loss, consistent with pathologic compression fracture. In addition to pre-existing degenerative changes, the compression fracture adds mild additional narrowing of the spinal canal. 2. Multiple smaller foci of abnormal signal in L2, L3, L5, and S1, also concerning for metastatic disease. 3. L4-L5 severe spinal canal stenosis and bilateral neural foraminal narrowing, secondary to a combination of pathologic compression fracture and severe degenerative changes. 4. L2-L3 mild spinal canal stenosis and moderate right neural foraminal narrowing. 5. L3-L4 severe right neural foraminal narrowing and L5-S1 moderate bilateral neural foraminal narrowing. Electronically Signed   By: Merilyn Baba M.D.   On: 07/20/2021 12:43   DG CHEST PORT 1 VIEW  Result Date: 07/20/2021 CLINICAL DATA:  Status post right thoracentesis EXAM: PORTABLE CHEST 1 VIEW COMPARISON:  Chest radiograph 07/17/2021 FINDINGS: The heart is enlarged, unchanged. The mediastinal contours are stable. The right pleural effusion has decreased in size following thoracentesis, with improved aeration of the right base. There is no significant left effusion. There is no new focal airspace disease. There is no appreciable pneumothorax. IMPRESSION: Decreased right pleural effusion with improved aeration of the right base following thoracentesis. No appreciable pneumothorax. Electronically Signed   By: Valetta Mole M.D.   On: 07/20/2021 10:52   IR THORACENTESIS ASP PLEURAL SPACE W/IMG GUIDE  Result Date: 07/20/2021 INDICATION: End-stage renal disease on hemodialysis. Recurrent right pleural effusion. Request for therapeutic thoracentesis. EXAM: ULTRASOUND GUIDED RIGHT THORACENTESIS MEDICATIONS: 1% lidocaine 15 mL COMPLICATIONS: None immediate.  PROCEDURE: An ultrasound guided thoracentesis was thoroughly discussed with the patient and questions answered. The benefits, risks, alternatives and complications were also discussed. The patient understands and wishes to proceed with the procedure. Written consent was obtained. Ultrasound was performed to localize and mark an adequate pocket of fluid in the right chest. The area was then prepped and draped in the normal sterile fashion. 1% Lidocaine was used for local anesthesia. Under ultrasound guidance a 6 Fr Safe-T-Centesis catheter was introduced. Thoracentesis was performed. The catheter was removed and a dressing applied. FINDINGS: A total of approximately 1.2 L of clear amber fluid was removed. IMPRESSION: Successful ultrasound guided right thoracentesis yielding 1.2 L of pleural fluid. No pneumothorax on post-procedure chest x-ray. Read by: Gareth Eagle, PA-C Electronically Signed   By: Aletta Edouard M.D.   On: 07/20/2021 12:18    Medications:  heparin 600 Units/hr (07/21/21 0541)    (feeding supplement) PROSource Plus  30 mL Oral BID BM   allopurinol  300 mg Oral QHS   atorvastatin  80 mg Oral QPM   cinacalcet  30 mg Oral Q supper   doxercalciferol  2 mcg Intravenous Q T,Th,Sa-HD   ferric citrate  420 mg Oral TID WC   ferrous sulfate  325 mg Oral Daily   gabapentin  100 mg Oral TID  metoprolol tartrate  12.5 mg Oral BID   midodrine  10 mg Oral Daily   multivitamin  1 tablet Oral QHS   vitamin B-12  1,000 mcg Oral Daily    Dialysis Orders: [facility],MWF, 4hr Edw 88.5,2k, 3ca bath uf2 Hec 2 No Hep Venofer 50  No ESA 2/2 CA       Problem/Plan: Intractable Back Pain with pathologic fracture of L4 due to metastatic carcinoma.  MRI spine with severe degenerative changes of lumbar spine, lesion likely representing metastatic disease, +L4-L5 compression fracture. pain meds/management per admit/oncology ESRD -  HD MWF, off schedule this week due to staffing issues and increased  patient census.  Last HD 8/25. Plan for next HD Monday.  Labs and volume stable, follow closely.  R pleural effusion - s/p IR paracentesis 8/26 with 1.2L yield clear amber fluids. CXR with improvement in pleural effusion.  Hypertension/volume - BP well controlled with lopressor 12.5mg  BID and midodrine 10mg  daily.  Does not appear grossly volume overloaded. UF as tolerated.  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 Lorenso Courier oncology noted notes. Palliative care consulted, patient now DNAR/DNI.  Family meeting planned for today. 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  Continue Hectorol 14mcg, Sensipar, auryxia Cardiomyopathy /EF 3035%= UF on HD as tolerated 2 L Atrial fib on lopressor and Eliquis.   Nutrition -albumin 3.6 renal diet  Jen Mow, PA-C [facility] 07/21/2021,1:38 PM  LOS: 2 days

## 2021-07-21 NOTE — Progress Notes (Addendum)
PROGRESS NOTE  Melissa Adams  DOB: 09-14-1946  PCP: [name], NP WEX:[id number]  DOA: 07/17/2021  LOS: 2 days  Hospital Day: 5   Chief complaint: Back pain  Brief narrative: Melissa Adams is a 75 y.o. 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 Patient follows up with oncologist Dr. Lorenso Courier.  She had an MRI of the abdomen under anesthesia done on 06/19/2021 which showed innumerable enhancing lesions throughout both lobes of liver concerning for metastatic disease. On 06/28/2021, she was seen by Dr. Lorenso Courier in the office and had a CT abdomen and pelvis done on 8/12.  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  She also had a small to moderate right pleural effusion for which she sees pulmonologist Dr. Erin Fulling.  She had a thoracentesis in May which did not show malignant cells and cytology. On 8/15, she underwent a liver biopsy that showed adenocarcinoma of unknown primary. On 8/23, patient went to Dr. August Albino office for follow-up.  She remains in excruciating pain in her lower back over the past few weeks because of which she has not been able to walk on her own. Because of the severity of her pain, she was sent to the ED for inpatient management. Chest x-ray showed right middle lobe consolidation with moderate right pleural effusion. Patient was admitted to hospitalist service for further evaluation management  Subjective: Patient was seen and examined this morning.  More awake today.  Pain controlled.  Lying on bed.  Not in distress.  She understands the gravity of her situation.  Waiting for palliative care meeting this morning.   Assessment/Plan: Intractable right-sided lower back  pain Pathological fracture of L4 vertebrae due to metastatic disease -8/26, underwent MRI of lumbar spine under anesthesia with C needed because of claustrophobia.  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  Also has multilevel moderate to severe neural foraminal narrowing in the lumbar region.  -Discussed with oncologist Dr. Lorenso Courier, as well as IR.  Patient is a poor candidate for open surgical intervention by neurosurgery.  IR to attempt vertebroplasty on Monday for pain control. -Continue pain control with IV Dilaudid and oral oxycodone. -Palliative care consult appreciated.  Planned for meeting this morning.  Metastatic adenocarcinoma of unknown primary -Shown on liver biopsy from 8/15. -Unable to go for previously planned outpatient breast biopsy because patient is hospitalized.  IR to try vertebral biopsy at the time of kyphoplasty.  Right pleural effusion -8/26, underwent thoracentesis with removal of 1.2 L of pleural fluid.  ESRD-HD-MWF -Nephrology consulted -Continue midodrine  Chronic systolic CHF Cardiogenic liver cirrhosis -Last EF 30 to 35%.  Patient was on Toprol-XL as result high dose of Lasix.  With poor oral intake and intermittent hypotension, Lasix is on hold.  Toprol to continue at a lower dose.  A. fib  -Continue Toprol at a lower dose.  Eliquis on hold.  On heparin drip for possible vertebroplasty on Monday.  Mobility: PT eval after pain control Code Status:   Code Status: DNR  Nutritional status: Body mass index is 36.48 kg/m.     Diet:  Diet Order  Diet NPO time specified Except for: Sips with Meds  Diet effective midnight           Diet renal with fluid restriction Fluid restriction: 1200 mL Fluid; Room service appropriate? Yes; Fluid consistency: Thin  Diet effective now                   DVT prophylaxis:     Antimicrobials: None Fluid: None Consultants: Oncology, IR Family Communication: Called and updated patient's daughter Ms. Melissa Adams this afternoon.  Status is: Inpatient  Remains inpatient appropriate because: On heparin drip, pending vertebroplasty  Dispo: The patient is from: Home              Anticipated d/c is to:  Pending vertebroplasty on monday.              Patient currently is not medically stable to d/c.   Difficult to place patient No     Infusions:   heparin 600 Units/hr (07/21/21 0541)    Scheduled Meds:  (feeding supplement) PROSource Plus  30 mL Oral BID BM   allopurinol  300 mg Oral QHS   atorvastatin  80 mg Oral QPM   cinacalcet  30 mg Oral Q supper   doxercalciferol  2 mcg Intravenous Q T,Th,Sa-HD   ferric citrate  420 mg Oral TID WC   ferrous sulfate  325 mg Oral Daily   gabapentin  100 mg Oral TID   metoprolol tartrate  12.5 mg Oral BID   midodrine  10 mg Oral Daily   multivitamin  1 tablet Oral QHS   vitamin B-12  1,000 mcg Oral Daily    Antimicrobials: Anti-infectives (From admission, onward)    None       PRN meds: acetaminophen **OR** acetaminophen, albuterol, HYDROmorphone (DILAUDID) injection, lidocaine, LORazepam, ondansetron **OR** ondansetron (ZOFRAN) IV   Objective: Vitals:   07/21/21 0742 07/21/21 0748  BP: (!) 83/52 (!) 90/53  Pulse: 92 89  Resp: 20   Temp: 98.5 F (36.9 C)   SpO2: 98%     Intake/Output Summary (Last 24 hours) at 07/21/2021 1051 Last data filed at 07/21/2021 0400 Gross per 24 hour  Intake 786.68 ml  Output --  Net 786.68 ml   Filed Weights   07/19/21 1545 07/20/21 0840  Weight: 93.4 kg 93.4 kg   Weight change: 0 kg Body mass index is 36.48 kg/m.   Physical Exam: General exam: Pleasant, elderly African-American female.  Pain controlled Skin: No rashes, lesions or ulcers. HEENT: Atraumatic, normocephalic, no obvious bleeding Lungs: Shallow respiratory effort probably  because of sedation. CVS: Regular rate and rhythm GI/Abd soft, nontender, nondistended, bowel sound present CNS: Alert awake oriented x3.  Limited mobility of lower extremities because of pain Psychiatry: Depressed look Extremities: No pedal edema, no calf tenderness  Data Review: I have personally reviewed the laboratory data and studies available.  Recent Labs  Lab 07/17/21 1847 07/20/21 0802 07/20/21 0914 07/21/21 0325  WBC 8.7 8.2  --  6.7  NEUTROABS 6.9  --   --   --   HGB 11.3* 10.3* 10.2* 10.6*  HCT 36.9 33.4* 30.0* 34.1*  MCV 99.2 99.1  --  100.6*  PLT 172 91*  --  113*   Recent Labs  Lab 07/17/21 1847 07/20/21 0914 07/20/21 1700  NA 133* 140  --   K 3.8 3.8  --   CL 95* 100  --   CO2 23  --   --   GLUCOSE 86 86  --  BUN 29* 31*  --   CREATININE 5.81* 4.80*  --   CALCIUM 9.6  --   --   PHOS  --   --  4.8*    F/u labs ordered Unresulted Labs (From admission, onward)     Start     Ordered   07/22/21 0500  APTT  Daily,   R     Question:  Specimen collection method  Answer:  Lab=Lab collect   07/20/21 1840   07/22/21 0500  Heparin level (unfractionated)  Daily,   R     Question:  Specimen collection method  Answer:  Lab=Lab collect   07/20/21 1840   07/21/21 1400  APTT  Once-Timed,   TIMED       Question:  Specimen collection method  Answer:  Lab=Lab collect   07/21/21 0521   07/21/21 0500  CBC  Daily,   R     Question:  Specimen collection method  Answer:  Lab=Lab collect   07/20/21 1840            Signed, Terrilee Croak, MD [facility] 07/21/2021

## 2021-07-22 DIAGNOSIS — M4856XA Collapsed vertebra, not elsewhere classified, lumbar region, initial encounter for fracture: Secondary | ICD-10-CM | POA: Diagnosis not present

## 2021-07-22 DIAGNOSIS — Z515 Encounter for palliative care: Secondary | ICD-10-CM | POA: Diagnosis not present

## 2021-07-22 LAB — HEPARIN LEVEL (UNFRACTIONATED): Heparin Unfractionated: >1.1 IU/mL — ABNORMAL HIGH (ref 0.30–0.70)

## 2021-07-22 LAB — CBC
HCT: 34.2 % — ABNORMAL LOW (ref 36.0–46.0)
Hemoglobin: 10.6 g/dL — ABNORMAL LOW (ref 12.0–15.0)
MCH: 31.2 pg (ref 26.0–34.0)
MCHC: 31 g/dL (ref 30.0–36.0)
MCV: 100.6 fL — ABNORMAL HIGH (ref 80.0–100.0)
Platelets: 111 10*3/uL — ABNORMAL LOW (ref 150–400)
RBC: 3.4 MIL/uL — ABNORMAL LOW (ref 3.87–5.11)
RDW: 17.4 % — ABNORMAL HIGH (ref 11.5–15.5)
WBC: 8 10*3/uL (ref 4.0–10.5)
nRBC: 0 % (ref 0.0–0.2)

## 2021-07-22 LAB — APTT
aPTT: 49 seconds — ABNORMAL HIGH (ref 24–36)
aPTT: 63 seconds — ABNORMAL HIGH (ref 24–36)

## 2021-07-22 MED ORDER — DOXERCALCIFEROL 4 MCG/2ML IV SOLN
2.0000 ug | INTRAVENOUS | Status: DC
  Administered 2021-07-23 – 2021-07-30 (×4): 2 ug via INTRAVENOUS
  Filled 2021-07-22 (×7): qty 2

## 2021-07-22 MED ORDER — CEFAZOLIN SODIUM-DEXTROSE 2-4 GM/100ML-% IV SOLN: 2.0000 g | INTRAVENOUS | Status: AC

## 2021-07-22 MED ORDER — SENNOSIDES-DOCUSATE SODIUM 8.6-50 MG PO TABS
1.0000 | ORAL_TABLET | Freq: Two times a day (BID) | ORAL | Status: DC
  Administered 2021-07-22 – 2021-07-31 (×19): 1 via ORAL
  Filled 2021-07-22 (×18): qty 1

## 2021-07-22 NOTE — Progress Notes (Signed)
Chief Complaint: Patient was seen in consultation today for pathologic L4 fx  Referring Physician(s): Dr. Marlowe Aschoff Dahal Dr. Narda Rutherford  Supervising Physician: Katherina Right Lyla Glassing  Patient Status: [facility] - In-pt  History of Present Illness: Melissa Adams is a 75 y.o. female with multiple medical problems including ESRD on HD, A. 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 She was also noted to have pathologic L4 fracture with only mild sxs at that time.  Since then she has had worsening back pain and has now been admitted, Her imaging workup now includes MRI which again finds pathologic L4 fracture but also significant spinal stenosis. She is not felt to be a good surgical candidate. NIR has been consulted for KP procedure, bx also requested. Imaging reviewed with Dr. Karenann Cai. Pt felt to be candidate for osteocool RF ablation with vertebral augmentation. Bx can be attempted at the time of procedure. PMHx, meds, labs, imaging, allergies reviewed. 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   Cardiomyopathy ([place])    CHF (congestive heart failure) ([place])    Chronic kidney disease    Edema    History of hemodialysis    Hyperlipidemia    Hypertension    Hypotension    on midodrine (as of 06/15/21)   Pulmonary hypertension (HCC)    Recurrent right pleural effusion    As of 06/15/21: s/p thoracenteses 05/05/20, 06/11/20, 08/17/20, 11/18/20, 04/06/21   Sleep apnea    Tricuspid regurgitation     Past Surgical History:  Procedure Laterality Date   A/V FISTULAGRAM Left 05/23/2020   Procedure: A/V FISTULAGRAM;  Surgeon: [name], MD;  Location: [place] [facility];  Service: Cardiovascular;  Laterality: Left;   A/V FISTULAGRAM N/A 12/21/2020   Procedure: A/V FISTULAGRAM - Left Upper Arm;  Surgeon: [name], MD;  Location: [place] [facility];  Service: Cardiovascular;  Laterality: N/A;   ABDOMINAL HYSTERECTOMY     AV FISTULA PLACEMENT Left 11/04/2019   Procedure: Insertion Of Arteriovenous (Av) Gore-Tex Graft Arm;  Surgeon: [name], MD;  Location: [place];  Service: Vascular;  Laterality: Left;   BIOPSY  11/14/2020   Procedure: BIOPSY;  Surgeon: Yetta Flock, MD;  Location: [place];  Service: Gastroenterology;;   BIOPSY  11/16/2020   Procedure: BIOPSY;  Surgeon: Yetta Flock, MD;  Location: [place];  Service: Gastroenterology;;   BIOPSY  02/22/2021   Procedure: BIOPSY;  Surgeon: Irving Copas., MD;  Location: [facility] ENDOSCOPY;  Service: Gastroenterology;;   CARDIAC CATHETERIZATION     CARDIOVERSION N/A 10/26/2019   Procedure: CARDIOVERSION;  Surgeon: [name], MD;  Location: [facility] ENDOSCOPY;  Service: Cardiovascular;  Laterality: N/A;   COLONOSCOPY W/ BIOPSIES AND POLYPECTOMY     COLONOSCOPY WITH PROPOFOL N/A 11/16/2020   Procedure: COLONOSCOPY WITH PROPOFOL;  Surgeon: Yetta Flock, MD;  Location: [place];  Service: Gastroenterology;  Laterality: N/A;   ENDOSCOPIC MUCOSAL RESECTION N/A 02/22/2021   Procedure: ENDOSCOPIC MUCOSAL RESECTION;  Surgeon: Rush Landmark Telford Nab., MD;  Location: [place];  Service: Gastroenterology;  Laterality: N/A;   ESOPHAGOGASTRODUODENOSCOPY (EGD) WITH PROPOFOL Left 11/14/2020  Procedure: ESOPHAGOGASTRODUODENOSCOPY (EGD) WITH PROPOFOL;  Surgeon: Yetta Flock, MD;  Location: [place];  Service: Gastroenterology;  Laterality: Left;   ESOPHAGOGASTRODUODENOSCOPY (EGD) WITH PROPOFOL N/A 02/22/2021   Procedure: ESOPHAGOGASTRODUODENOSCOPY (EGD) WITH PROPOFOL;  Surgeon: Rush Landmark Telford Nab., MD;  Location: [place];  Service:  Gastroenterology;  Laterality: N/A;   FISTULA SUPERFICIALIZATION Left 01/21/2020   Procedure: FISTULA SUPERFICIALIZATION OF FISTULA;  Surgeon: [name], MD;  Location: [place];  Service: Vascular;  Laterality: Left;   FISTULA SUPERFICIALIZATION Left 08/01/2951   Procedure: PLICATION OF LEFT UPPER EXTREMITY ARTERIOVENOUS FISTULA;  Surgeon: Waynetta [name], MD;  Location: [place];  Service: Vascular;  Laterality: Left;   GIVENS CAPSULE STUDY N/A 11/16/2020   Procedure: GIVENS CAPSULE STUDY;  Surgeon: Yetta Flock, MD;  Location: [place];  Service: Gastroenterology;  Laterality: N/A;   HEMOSTASIS CLIP PLACEMENT  02/22/2021   Procedure: HEMOSTASIS CLIP PLACEMENT;  Surgeon: Rush Landmark Telford Nab., MD;  Location: [place];  Service: Gastroenterology;;   INSERTION OF DIALYSIS CATHETER Right 11/04/2019   Procedure: CONVERT TEMPORARY DIALYSIS CATHETER TO TUNNELED DIALYSIS CATHETER Right Internal Jugular.;  Surgeon: [name], MD;  Location: [place];  Service: Vascular;  Laterality: Right;   IR THORACENTESIS ASP PLEURAL SPACE W/IMG GUIDE  08/17/2020   IR THORACENTESIS ASP PLEURAL SPACE W/IMG GUIDE  07/20/2021   MULTIPLE TOOTH EXTRACTIONS     POLYPECTOMY  11/16/2020   Procedure: POLYPECTOMY;  Surgeon: Yetta Flock, MD;  Location: [place];  Service: Gastroenterology;;   RADIOLOGY WITH ANESTHESIA N/A 06/19/2021   Procedure: MRI WITH ANESTHESIA  ,LIVER WITH AND WITHOUT CONTRAST;  Surgeon: Radiologist, Medication, MD;  Location: [place];  Service: Radiology;  Laterality: N/A;   REVISION OF ARTERIOVENOUS GORETEX GRAFT Left 01/21/2020   Procedure: REVISION OF ARTERIOVENOUS FISTULA WITH SIDE BRANCH LIGATION;  Surgeon: [name], MD;  Location: [place];  Service: Vascular;  Laterality: Left;   SUBMUCOSAL LIFTING INJECTION  02/22/2021   Procedure: SUBMUCOSAL LIFTING INJECTION;  Surgeon: Irving Copas., MD;  Location: [place];  Service:  Gastroenterology;;   SUBMUCOSAL TATTOO INJECTION  02/22/2021   Procedure: SUBMUCOSAL TATTOO INJECTION;  Surgeon: Irving Copas., MD;  Location: [place];  Service: Gastroenterology;;   TEE WITHOUT CARDIOVERSION N/A 10/26/2019   Procedure: TRANSESOPHAGEAL ECHOCARDIOGRAM (TEE);  Surgeon: [name], MD;  Location: [facility] ENDOSCOPY;  Service: Cardiovascular;  Laterality: N/A;   THORACENTESIS     x 2   THORACENTESIS N/A 11/07/2020   Procedure: Mathews Robinsons;  Surgeon: [name], MD;  Location: [facility] ENDOSCOPY;  Service: Pulmonary;  Laterality: N/A;   THORACENTESIS N/A 04/06/2021   Procedure: THORACENTESIS;  Surgeon: Freddi Starr, MD;  Location: [facility] ENDOSCOPY;  Service: Pulmonary;  Laterality: N/A;   UPPER ESOPHAGEAL ENDOSCOPIC ULTRASOUND (EUS) N/A 02/22/2021   Procedure: UPPER ESOPHAGEAL ENDOSCOPIC ULTRASOUND (EUS);  Surgeon: Irving Copas., MD;  Location: [place];  Service: Gastroenterology;  Laterality: N/A;    Allergies: Black walnut flavor and Shellfish allergy  Medications:  Current Facility-Administered Medications:    (feeding supplement) PROSource Plus liquid 30 mL, 30 mL, Oral, BID BM, Alcario Drought, Jared M, DO, 30 mL at 07/22/21 8413   acetaminophen (TYLENOL) tablet 650 mg, 650 mg, Oral, Q6H PRN **OR** acetaminophen (TYLENOL) suppository 650 mg, 650 mg, Rectal, Q6H PRN, Alcario Drought, Jared M, DO   albuterol (PROVENTIL) (2.5 MG/3ML) 0.083% nebulizer solution 2.5 mg, 2.5 mg, Inhalation, Q6H PRN, Alcario Drought, Jared M, DO   allopurinol (ZYLOPRIM) tablet 300 mg, 300 mg, Oral, QHS,  Etta Quill, DO, 300 mg at 07/21/21 2118   atorvastatin (LIPITOR) tablet 80 mg, 80 mg, Oral, QPM, Etta Quill, DO, 80 mg at 07/21/21 1831   cinacalcet (SENSIPAR) tablet 30 mg, 30 mg, Oral, Q supper, Alcario Drought, Jared M, DO, 30 mg at 07/21/21 1831   [START ON 07/23/2021] doxercalciferol (HECTOROL) injection 2 mcg, 2 mcg, Intravenous, Q M,W,F-HD, Donnamae Jude, RPH   ferric citrate  (AURYXIA) tablet 420 mg, 420 mg, Oral, TID WC, Alcario Drought, Jared M, DO, 420 mg at 07/22/21 7902   ferrous sulfate tablet 325 mg, 325 mg, Oral, Daily, Donnamae Jude, RPH, 325 mg at 07/22/21 4097   gabapentin (NEURONTIN) capsule 100 mg, 100 mg, Oral, TID, Etta Quill, DO, 100 mg at 07/22/21 3532   heparin ADULT infusion 100 units/mL (25000 units/251mL), 900 Units/hr, Intravenous, Continuous, Donnamae Jude, [facility], Last Rate: 9 mL/hr at 07/22/21 0746, 900 Units/hr at 07/22/21 0746   HYDROmorphone (DILAUDID) injection 1 mg, 1 mg, Intravenous, Q3H PRN, Dahal, Marlowe Aschoff, MD, 1 mg at 07/19/21 0853   lidocaine (XYLOCAINE) 1 % (with pres) injection, , , PRN, Ardis Rowan, PA-C, 15 mL at 07/20/21 1009   LORazepam (ATIVAN) tablet 1 mg, 1 mg, Oral, Once PRN, Etta Quill, DO   metoprolol tartrate (LOPRESSOR) tablet 12.5 mg, 12.5 mg, Oral, BID, Dahal, Binaya, MD, 12.5 mg at 07/20/21 2134   midodrine (PROAMATINE) tablet 10 mg, 10 mg, Oral, Daily, Alcario Drought, Jared M, DO, 10 mg at 07/22/21 9924   multivitamin (RENA-VIT) tablet 1 tablet, 1 tablet, Oral, QHS, Alcario Drought, Jared M, DO, 1 tablet at 07/21/21 2118   ondansetron (ZOFRAN) tablet 4 mg, 4 mg, Oral, Q6H PRN **OR** ondansetron (ZOFRAN) injection 4 mg, 4 mg, Intravenous, Q6H PRN, Alcario Drought, Jared M, DO   senna-docusate (Senokot-S) tablet 1 tablet, 1 tablet, Oral, BID, Ferolito, Freada Bergeron, NP   vitamin B-12 (CYANOCOBALAMIN) tablet 1,000 mcg, 1,000 mcg, Oral, Daily, Alcario Drought, Jared M, DO, 1,000 mcg at 07/22/21 2683    Family History  Problem Relation Age of Onset   Seizures Father    CAD Father    Diabetes Sister    Lupus Sister    Hypertension Sister    Colon cancer Neg Hx    Esophageal cancer Neg Hx    Pancreatic cancer Neg Hx    Stomach cancer Neg Hx    Inflammatory bowel disease Neg Hx    Liver disease Neg Hx    Rectal cancer Neg Hx     Social History   Socioeconomic History   Marital status: Widowed    Spouse name: Not on file   Number of  children: Not on file   Years of education: Not on file   Highest education level: Not on file  Occupational History   Not on file  Tobacco Use   Smoking status: Former    Types: Cigarettes   Smokeless tobacco: Never  Vaping Use   Vaping Use: Never used  Substance and Sexual Activity   Alcohol use: Not Currently   Drug use: Not Currently   Sexual activity: Not Currently  Other Topics Concern   Not on file  Social History Narrative   Not on file   Social Determinants of Health   Financial Resource Strain: Not on file  Food Insecurity: Not on file  Transportation Needs: Not on file  Physical Activity: Not on file  Stress: Not on file  Social Connections: Not on file     Review of Systems:  A 12 point ROS discussed and pertinent positives are indicated in the HPI above.  All other systems are negative.  Review of Systems  Vital Signs: BP (!) 100/57 (BP Location: Right Arm)   Pulse 96   Temp 98.8 F (37.1 C) (Oral)   Resp 17   Ht 5\' 3"  (1.6 m)   Wt 93.4 kg   SpO2 100%   BMI 36.48 kg/m   Physical Exam Constitutional:      General: She is not in acute distress.    Appearance: Normal appearance. She is not ill-appearing.  HENT:     Mouth/Throat:     Mouth: Mucous membranes are moist.     Pharynx: Oropharynx is clear.  Cardiovascular:     Rate and Rhythm: Normal rate. Rhythm irregular.     Heart sounds: Normal heart sounds.  Pulmonary:     Effort: Pulmonary effort is normal. No respiratory distress.     Breath sounds: Normal breath sounds.  Musculoskeletal:     Comments: Tender about the lower lumbar spinous region  Psychiatric:        Mood and Affect: Mood normal.        Thought Content: Thought content normal.        Judgment: Judgment normal.    Imaging: CT Abdomen Pelvis Wo Contrast  Result Date: 07/08/2021 CLINICAL DATA:  Duodenal neuroendocrine tumor resection, numerous liver masses with unknown primary EXAM: CT CHEST, ABDOMEN AND PELVIS WITHOUT  CONTRAST TECHNIQUE: Multidetector CT imaging of the chest, abdomen and pelvis was performed following the standard protocol without IV contrast. COMPARISON:  MRI abdomen dated 06/19/2021. CT abdomen/pelvis dated 11/17/2020. CT chest dated 06/10/2020. FINDINGS: Limited evaluation due to lack of intravenous contrast administration. CT CHEST FINDINGS Cardiovascular: Cardiomegaly.  No pericardial effusion. No evidence of thoracic aortic aneurysm. Atherosclerotic calcifications of the aortic arch. Mediastinum/Nodes: No suspicious mediastinal lymphadenopathy. Multiple right axillary nodes measuring up to 12 mm short axis (series 2/image 20). Lungs/Pleura: Small to moderate right pleural effusion, partially loculated. Associated right middle and right lower lobe compressive atelectasis. Multiple scattered small bilateral pulmonary nodules measuring 3-5 mm (for example, series 4/images 30, 34, 56, 62, 63, 74, 76, and 118). These are new from July 2021. No pneumothorax. Musculoskeletal: Asymmetric glandular tissue with a possible underlying 2.6 x 2.3 cm lesion in the right lateral breast (series 2/image 34). Overlying skin thickening. Degenerative changes of the thoracic spine. CT ABDOMEN PELVIS FINDINGS Hepatobiliary: Multifocal hepatic masses throughout both lobes, poorly evaluated on unenhanced CT. Index lesion measures 3.5 cm in the posterior aspect of segment 2 (series 2/image 54). Additional index lesion measures 2.4 cm in segment 6 (series 2/image 71). These are new from December 2021, suggesting metastases. Layering gallstones (series 2/image 71), without associated inflammatory changes. No intrahepatic or extrahepatic duct dilatation. Pancreas: Within normal limits. Spleen: Within normal limits. Adrenals/Urinary Tract: 19 mm left adrenal adenoma (series 2/image 65). Right adrenal gland is within normal limits. 16 mm left lower pole renal cyst (series 2/image 37). Three nonobstructing left renal calculi measuring up  to 5 mm in the lower pole (coronal image 93). Two nonobstructing right lower pole renal calculi measuring up to 3 mm (coronal image 90). No hydronephrosis. Bladder is underdistended and poorly evaluated. Stomach/Bowel: Stomach is within normal limits. No evidence of bowel obstruction. Normal appendix (series 2/image 92). No colonic wall thickening or mass is evident on CT. Vascular/Lymphatic: No evidence of abdominal aortic aneurysm. Atherosclerotic calcifications of the abdominal aorta and branch vessels. No suspicious abdominopelvic  lymphadenopathy. Reproductive: Status post hysterectomy. No adnexal masses. Other: Small volume pelvic ascites. Musculoskeletal: Degenerative changes of the lumbar spine. Mild to moderate loss of height involving the L4 vertebral body with mild sclerosis in suspected lytic metastasis along the posterior aspect (sagittal image 108), suggesting pathologic fracture. No retropulsion. IMPRESSION: Limited evaluation due to lack of intravenous contrast administration. Possible 2.6 cm right lateral breast lesion, raising concern for primary breast neoplasm. Overlying skin thickening. Associated right axillary lymphadenopathy. Mammographic correlation is suggested. Small bilateral pulmonary nodules measuring up to 5 mm, new from July 2021, raising concern for metastases. Small to moderate right pleural effusion, partially loculated. Multifocal hepatic metastases throughout both lobes, new from December 2021. Mild to moderate pathologic compression fracture at L4. No retropulsion. Additional ancillary findings as above. Electronically Signed   By: Julian Hy M.D.   On: 07/08/2021 19:50   DG Chest 2 View  Result Date: 07/17/2021 CLINICAL DATA:  Shortness of breath.  Possible lesion. EXAM: CHEST - 2 VIEW COMPARISON:  04/06/2021 FINDINGS: Cardiac enlargement. No vascular congestion. Moderate right pleural effusion. There is right middle lobe consolidation, likely pneumonia. This is  progressing since the previous study. Left lung is clear. No pneumothorax. Calcified and tortuous aorta. Degenerative changes in the spine. IMPRESSION: Right middle lobe consolidation with moderate right pleural effusion. Electronically Signed   By: Lucienne Capers M.D.   On: 07/17/2021 19:29   DG Lumbar Spine Complete  Result Date: 07/09/2021 CLINICAL DATA:  Fall 2 days ago, low back and bilateral hip pain EXAM: LUMBAR SPINE - COMPLETE 4+ VIEW COMPARISON:  CT abdomen/pelvis dated H 10/2021 FINDINGS: Five lumbar-type vertebral bodies. Normal lumbar lordosis. Mild to moderate compression fracture deformity at L4 with associated sclerosis, favoring a pathologic fracture when correlating with recent CT. Otherwise, no evidence of acute fracture or dislocation. Mild multilevel degenerative changes. Visualized bony pelvis appears intact. IMPRESSION: Mild to moderate pathologic compression fracture at L4, better evaluated on recent CT. Electronically Signed   By: Julian Hy M.D.   On: 07/09/2021 19:04   CT Chest Wo Contrast  Result Date: 07/08/2021 CLINICAL DATA:  Duodenal neuroendocrine tumor resection, numerous liver masses with unknown primary EXAM: CT CHEST, ABDOMEN AND PELVIS WITHOUT CONTRAST TECHNIQUE: Multidetector CT imaging of the chest, abdomen and pelvis was performed following the standard protocol without IV contrast. COMPARISON:  MRI abdomen dated 06/19/2021. CT abdomen/pelvis dated 11/17/2020. CT chest dated 06/10/2020. FINDINGS: Limited evaluation due to lack of intravenous contrast administration. CT CHEST FINDINGS Cardiovascular: Cardiomegaly.  No pericardial effusion. No evidence of thoracic aortic aneurysm. Atherosclerotic calcifications of the aortic arch. Mediastinum/Nodes: No suspicious mediastinal lymphadenopathy. Multiple right axillary nodes measuring up to 12 mm short axis (series 2/image 20). Lungs/Pleura: Small to moderate right pleural effusion, partially loculated. Associated  right middle and right lower lobe compressive atelectasis. Multiple scattered small bilateral pulmonary nodules measuring 3-5 mm (for example, series 4/images 30, 34, 56, 62, 63, 74, 76, and 118). These are new from July 2021. No pneumothorax. Musculoskeletal: Asymmetric glandular tissue with a possible underlying 2.6 x 2.3 cm lesion in the right lateral breast (series 2/image 34). Overlying skin thickening. Degenerative changes of the thoracic spine. CT ABDOMEN PELVIS FINDINGS Hepatobiliary: Multifocal hepatic masses throughout both lobes, poorly evaluated on unenhanced CT. Index lesion measures 3.5 cm in the posterior aspect of segment 2 (series 2/image 54). Additional index lesion measures 2.4 cm in segment 6 (series 2/image 71). These are new from December 2021, suggesting metastases. Layering gallstones (series 2/image 71), without  associated inflammatory changes. No intrahepatic or extrahepatic duct dilatation. Pancreas: Within normal limits. Spleen: Within normal limits. Adrenals/Urinary Tract: 19 mm left adrenal adenoma (series 2/image 65). Right adrenal gland is within normal limits. 16 mm left lower pole renal cyst (series 2/image 37). Three nonobstructing left renal calculi measuring up to 5 mm in the lower pole (coronal image 93). Two nonobstructing right lower pole renal calculi measuring up to 3 mm (coronal image 90). No hydronephrosis. Bladder is underdistended and poorly evaluated. Stomach/Bowel: Stomach is within normal limits. No evidence of bowel obstruction. Normal appendix (series 2/image 92). No colonic wall thickening or mass is evident on CT. Vascular/Lymphatic: No evidence of abdominal aortic aneurysm. Atherosclerotic calcifications of the abdominal aorta and branch vessels. No suspicious abdominopelvic lymphadenopathy. Reproductive: Status post hysterectomy. No adnexal masses. Other: Small volume pelvic ascites. Musculoskeletal: Degenerative changes of the lumbar spine. Mild to moderate  loss of height involving the L4 vertebral body with mild sclerosis in suspected lytic metastasis along the posterior aspect (sagittal image 108), suggesting pathologic fracture. No retropulsion. IMPRESSION: Limited evaluation due to lack of intravenous contrast administration. Possible 2.6 cm right lateral breast lesion, raising concern for primary breast neoplasm. Overlying skin thickening. Associated right axillary lymphadenopathy. Mammographic correlation is suggested. Small bilateral pulmonary nodules measuring up to 5 mm, new from July 2021, raising concern for metastases. Small to moderate right pleural effusion, partially loculated. Multifocal hepatic metastases throughout both lobes, new from December 2021. Mild to moderate pathologic compression fracture at L4. No retropulsion. Additional ancillary findings as above. Electronically Signed   By: Julian Hy M.D.   On: 07/08/2021 19:50   MR LUMBAR SPINE WO CONTRAST  Result Date: 07/20/2021 CLINICAL DATA:  Low back pain EXAM: MRI LUMBAR SPINE WITHOUT CONTRAST TECHNIQUE: Multiplanar, multisequence MR imaging of the lumbar spine was performed. No intravenous contrast was administered. COMPARISON:  No prior MRI. FINDINGS: Evaluation is limited by artifact from the degree of edema in the subcutaneous tissues. Segmentation: Standard. Alignment:  Physiologic. Vertebrae: Diffusely decreased T1 and heterogeneously increased T2 signal within the L4 vertebral body and pedicles. Vertebral body height loss, mild bowing of the posterior cortex inferiorly. Additional smaller T1 hypointense, T2 hyperintense lesions in L2 (series 6, images 7 and 9), L3 (series 6, image 9), L5 (series 6, images 7 and 10), and S1 (series 6, images 6 and 10). Multilevel endplate degenerative changes. Conus medullaris and cauda equina: Conus extends to the L1 level. Conus and cauda equina appear normal. No epidural extension of tumor. Paraspinal and other soft tissues: Multiple T1 and  T2 hyperintense lesions in the liver and kidneys. Edema in the subcutaneous tissues. Disc levels: T12-L1: No significant disc bulge. Mild facet arthropathy. No spinal canal stenosis or neural foraminal narrowing. L1-L2: Mild disc bulge. Moderate facet arthropathy. No spinal canal stenosis. No neural foraminal narrowing. L2-L3: Disc height loss with broad-based disc bulge. Moderate severe facet arthropathy and ligamentum flavum hypertrophy. Mild spinal canal stenosis. Moderate right neural foraminal narrowing. L3-L4: Broad-based disc bulge, somewhat eccentric to the right. Moderate facet arthropathy with ligamentum flavum hypertrophy. No spinal canal stenosis. Mild left and severe right neural foraminal narrowing. L4-L5: Mild narrowing of the spinal canal secondary to L4 pathologic compression fracture. Broad-based disc bulge. Severe facet arthropathy. Ligamentum flavum hypertrophy. Severe spinal canal stenosis. Severe bilateral neural foraminal narrowing. L5-S1: Broad-based disc bulge. Severe facet arthropathy. Ligamentum flavum hypertrophy. No spinal canal stenosis. Moderate bilateral neural foraminal narrowing. IMPRESSION: 1. Evaluation is somewhat limited by artifact caused by the degree  of edema in the subcutaneous tissues. Within this limitation, diffusely abnormal marrow signal within the L4 vertebral body with vertebral body height loss, consistent with pathologic compression fracture. In addition to pre-existing degenerative changes, the compression fracture adds mild additional narrowing of the spinal canal. 2. Multiple smaller foci of abnormal signal in L2, L3, L5, and S1, also concerning for metastatic disease. 3. L4-L5 severe spinal canal stenosis and bilateral neural foraminal narrowing, secondary to a combination of pathologic compression fracture and severe degenerative changes. 4. L2-L3 mild spinal canal stenosis and moderate right neural foraminal narrowing. 5. L3-L4 severe right neural foraminal  narrowing and L5-S1 moderate bilateral neural foraminal narrowing. Electronically Signed   By: Merilyn Baba M.D.   On: 07/20/2021 12:43   CT Hip Right Wo Contrast  Result Date: 07/10/2021 CLINICAL DATA:  Status post trauma. EXAM: CT OF THE RIGHT HIP WITHOUT CONTRAST TECHNIQUE: Multidetector CT imaging of the right hip was performed according to the standard protocol. Multiplanar CT image reconstructions were also generated. COMPARISON:  None. FINDINGS: Bones/Joint/Cartilage There is no evidence of acute fracture or dislocation. No lytic or blastic lesions are identified. There is no evidence of cortical destruction or acute periosteal reaction. Degenerative changes are seen in the form joint space narrowing and acetabular sclerosis. Bony spurring is also seen along the lateral aspect of the right acetabulum. Ligaments Suboptimally assessed by CT. Muscles and Tendons Unremarkable. Soft tissues Mild soft tissue swelling and subcutaneous inflammatory fat stranding is seen along the lateral aspect of the right hip. IMPRESSION: Degenerative changes and lateral soft tissue swelling without an acute osseous abnormality. Electronically Signed   By: Virgina Norfolk M.D.   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 Please perform ultrasound-guided liver lesion biopsy for tissue diagnostic purposes. EXAM: ULTRASOUND GUIDED LIVER LESION BIOPSY COMPARISON:  CT of the chest, abdomen and pelvis-07/06/2021; abdominal MRI-06/19/2021 MEDICATIONS: None ANESTHESIA/SEDATION: Fentanyl 50 mcg IV; Versed 0.5 mg IV Total Moderate Sedation time:  18 Minutes. The patient's level of consciousness and vital signs were monitored continuously by radiology nursing throughout the procedure under my direct supervision. COMPLICATIONS: None immediate. PROCEDURE: Informed written consent was obtained from the  patient after a discussion of the risks, benefits and alternatives to treatment. The patient understands and consents the procedure. A timeout was performed prior to the initiation of the procedure. Ultrasound scanning was performed of the right upper abdominal quadrant demonstrates multiple mixed echogenic though predominantly hypoechoic lesions and masses scattered throughout both the right and left lobes of the liver. A dominant approximately 3.1 x 2.9 cm hypoechoic mass within the right lobe of the liver (image 13) correlating with the lesion seen on preceding abdominal CT image 60, series 2 was targeted for biopsy given lesion location and sonographic window. The procedure was planned. The right upper abdominal quadrant was prepped and draped in the usual sterile fashion. The overlying soft tissues were anesthetized with 1% lidocaine with epinephrine. A 17 gauge, 6.8 cm co-axial needle was advanced into a peripheral aspect of the lesion. This was followed by 6 core biopsies with an 18 gauge core device under direct ultrasound guidance. The coaxial needle tract was embolized with a small amount of Gel-Foam slurry and superficial hemostasis was obtained with manual compression. Post procedural scanning was negative for definitive area of hemorrhage or additional complication. A dressing was placed. The patient tolerated the procedure well without immediate  post procedural complication. IMPRESSION: Technically successful ultrasound guided core needle biopsy of indeterminate lesion/mass within the right lobe of the liver. Electronically Signed   By: Sandi Mariscal M.D.   On: 07/09/2021 14:32   DG CHEST PORT 1 VIEW  Result Date: 07/20/2021 CLINICAL DATA:  Status post right thoracentesis EXAM: PORTABLE CHEST 1 VIEW COMPARISON:  Chest radiograph 07/17/2021 FINDINGS: The heart is enlarged, unchanged. The mediastinal contours are stable. The right pleural effusion has decreased in size following thoracentesis, with  improved aeration of the right base. There is no significant left effusion. There is no new focal airspace disease. There is no appreciable pneumothorax. IMPRESSION: Decreased right pleural effusion with improved aeration of the right base following thoracentesis. No appreciable pneumothorax. Electronically Signed   By: Valetta Mole M.D.   On: 07/20/2021 10:52   DG HIP UNILAT WITH PELVIS 2-3 VIEWS LEFT  Result Date: 07/09/2021 CLINICAL DATA:  Low back pain bilateral hip pain post fall EXAM: DG HIP (WITH OR WITHOUT PELVIS) 2-3V LEFT COMPARISON:  CT 07/06/2021 FINDINGS: Left pubic rami appear intact. No fracture or malalignment. Mild degenerative changes. IMPRESSION: No acute osseous abnormality Electronically Signed   By: Donavan Foil M.D.   On: 07/09/2021 19:03   DG HIP UNILAT WITH PELVIS 2-3 VIEWS RIGHT  Result Date: 07/09/2021 CLINICAL DATA:  Hip pain post fall EXAM: DG HIP (WITH OR WITHOUT PELVIS) 2-3V RIGHT COMPARISON:  CT 07/06/2021 FINDINGS: SI joints are non widened. Pubic symphysis appears intact. Right femoral head projects in joint. There are mild degenerative changes. IMPRESSION: No definite acute osseous abnormality. Electronically Signed   By: Donavan Foil M.D.   On: 07/09/2021 19:04   IR THORACENTESIS ASP PLEURAL SPACE W/IMG GUIDE  Result Date: 07/20/2021 INDICATION: End-stage renal disease on hemodialysis. Recurrent right pleural effusion. Request for therapeutic thoracentesis. EXAM: ULTRASOUND GUIDED RIGHT THORACENTESIS MEDICATIONS: 1% lidocaine 15 mL COMPLICATIONS: None immediate. PROCEDURE: An ultrasound guided thoracentesis was thoroughly discussed with the patient and questions answered. The benefits, risks, alternatives and complications were also discussed. The patient understands and wishes to proceed with the procedure. Written consent was obtained. Ultrasound was performed to localize and mark an adequate pocket of fluid in the right chest. The area was then prepped and draped  in the normal sterile fashion. 1% Lidocaine was used for local anesthesia. Under ultrasound guidance a 6 Fr Safe-T-Centesis catheter was introduced. Thoracentesis was performed. The catheter was removed and a dressing applied. FINDINGS: A total of approximately 1.2 L of clear amber fluid was removed. IMPRESSION: Successful ultrasound guided right thoracentesis yielding 1.2 L of pleural fluid. No pneumothorax on post-procedure chest x-ray. Read by: Gareth Eagle, PA-C Electronically Signed   By: Aletta Edouard M.D.   On: 07/20/2021 12:18    Labs:  CBC: Recent Labs    07/17/21 1847 07/20/21 0802 07/20/21 0914 07/21/21 0325 07/22/21 0425  WBC 8.7 8.2  --  6.7 8.0  HGB 11.3* 10.3* 10.2* 10.6* 10.6*  HCT 36.9 33.4* 30.0* 34.1* 34.2*  PLT 172 91*  --  113* 111*    COAGS: Recent Labs    08/31/20 1637 11/14/20 0015 05/10/21 1501 07/09/21 1130 07/20/21 1700 07/21/21 0325 07/21/21 1444 07/22/21 0425  INR 1.8* 1.4* CANCELED 1.2  --   --   --   --   APTT  --   --   --   --  134* 131* 62* 49*    BMP: Recent Labs    04/12/21 1017 05/10/21 1501 06/19/21 0547 06/19/21  0720 07/09/21 1130 07/17/21 1847 07/20/21 0914  NA 141 137 135 133* 135 133* 140  K 3.9 4.0 5.0 5.4* 4.2 3.8 3.8  CL 97* 95* 92* 96* 91* 95* 100  CO2 30 29 31   --  28 23  --   GLUCOSE 90 99 93 89 81 86 86  BUN 26* 29* 39* 48* 63* 29* 31*  CALCIUM 9.8 9.9 9.2  --  9.4 9.6  --   CREATININE 5.57* 5.67* 5.78* 5.90* 6.57* 5.81* 4.80*  GFRNONAA 8*  --  7*  --  6* 7*  --     LIVER FUNCTION TESTS: Recent Labs    05/10/21 1501 06/19/21 0547 07/09/21 1130 07/17/21 1847  BILITOT 0.8 1.1 1.2 1.5*  AST 26 52* 54* 58*  ALT 19 37 49* 37  ALKPHOS 251* 215* 278* 238*  PROT 6.6 6.5 7.1 6.7  ALBUMIN 3.9 3.4* 3.7 3.6    TUMOR MARKERS: Recent Labs    08/31/20 1637 05/10/21 1501  AFPTM 3.4 2.9    Assessment and Plan: Pathologic L4 fracture A.fib on Eliquis, this has been held several days and pt started on  heparin gtt. Plan to procedure with image guided RF ablation of L4 pathologic fracture, with bx and vertebral augmentation. Discussed with daughter over the phone that sometimes biopsy is not amenable and that primary goal is palliative pain relief by treatment of the fracture. She understands and wishes to proceed. Labs reviewed. Risks and benefits of L4 co-ablation/augmentation procedure were discussed with the patient including, but not limited to education regarding the natural healing process of compression fractures without intervention, bleeding, infection, cement migration which may cause spinal cord damage, paralysis, pulmonary embolism or even death.  This interventional procedure involves the use of X-rays and because of the nature of the planned procedure, it is possible that we will have prolonged use of X-ray fluoroscopy.  Potential radiation risks to you include (but are not limited to) the following: - A slightly elevated risk for cancer  several years later in life. This risk is typically less than 0.5% percent. This risk is low in comparison to the normal incidence of human cancer, which is 33% for women and 50% for men according to the [place]. - Radiation induced injury can include skin redness, resembling a rash, tissue breakdown / ulcers and hair loss (which can be temporary or permanent).   The likelihood of either of these occurring depends on the difficulty of the procedure and whether you are sensitive to radiation due to previous procedures, disease, or genetic conditions.   IF your procedure requires a prolonged use of radiation, you will be notified and given written instructions for further action.  It is your responsibility to monitor the irradiated area for the 2 weeks following the procedure and to notify your physician if you are concerned that you have suffered a radiation induced injury.    All of the patient's questions were answered, patient is  agreeable to proceed.  Consent signed and in chart.  Plan to stop heparin gtt at 0600, discussed with pharmacy NPO p MN   Thank you for this interesting consult.  I greatly enjoyed [place] and look forward to participating in their care.  A copy of this report was sent to the requesting provider on this date.  Electronically Signed: Ascencion Dike, PA-C 07/22/2021, 1:58 PM   I spent a total of 30 minutes  in face to face in clinical consultation, greater  than 50% of which was counseling/coordinating care for L4 fracture

## 2021-07-22 NOTE — Progress Notes (Addendum)
ANTICOAGULATION CONSULT NOTE - Follow Up Consult  Pharmacy Consult for IV Heparin Indication: atrial fibrillation  Allergies  Allergen Reactions   Black Walnut Flavor Hives   Shellfish Allergy Itching    Makes throat itch     Patient Measurements: Height: 5\' 3"  (160 cm) Weight: 93.4 kg (205 lb 14.6 oz) IBW/kg (Calculated) : 52.4 Heparin Dosing Weight: 73 kg  Vital Signs: Temp: 98.2 F (36.8 C) (08/28 0628) Temp Source: Oral (08/28 0628) BP: 104/75 (08/28 0628) Pulse Rate: 91 (08/28 0628)  Labs: Recent Labs    07/20/21 0802 07/20/21 0914 07/20/21 0914 07/20/21 1700 07/21/21 0325 07/21/21 1444 07/22/21 0425  HGB 10.3* 10.2*  --   --  10.6*  --  10.6*  HCT 33.4* 30.0*  --   --  34.1*  --  34.2*  PLT 91*  --   --   --  113*  --  111*  APTT  --   --    < > 134* 131* 62* 49*  HEPARINUNFRC  --   --   --  >1.10* >1.10*  --  >1.10*  CREATININE  --  4.80*  --   --   --   --   --    < > = values in this interval not displayed.   Estimated Creatinine Clearance: 11.2 mL/min (A) (by C-G formula based on SCr of 4.8 mg/dL (H)).  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 Pt has hx of atrial fibrillation, for which she was on apixaban PTA (last dose at 1100 on 8/25). Pt was transitioned to heparin  infusion for planned vertebroplasty.   APTT level 49 is subtherapeutic on 700 units/hr. HL still >1.1 and not correlating. Liver function may be affecting aPTT. H/H, plt stable.  No issues with infusion or bleeding per RN. Unclear why aPTT dropped with increased heparin rate.  Goal of Therapy:  Heparin level 0.3-0.7 units/ml aPTT 66-102 seconds Monitor platelets by anticoagulation protocol: Yes   Plan:  Increase heparin to 900 units/hr F/u APTT in 8 hr  F/u aPTT until correlates with heparin level  Monitor daily aPTT, HL, CBC/plt Monitor for signs/symptoms of bleeding   ADDENDUM 13:45: Per radiology, stop heparin at 6am for vertebroplasty. End time added   Benetta Spar, PharmD, BCPS, [facility] Clinical Pharmacist  Please check AMION for all [place] phone numbers After 10:00 PM, call [place] [phone]

## 2021-07-22 NOTE — Progress Notes (Signed)
PROGRESS NOTE  Melissa Adams  DOB: [birth year]  PCP: [name], NP OBS:[id number]  DOA: 07/17/2021  LOS: 3 days  Hospital Day: 6   Chief complaint: Back pain  Brief narrative: Melissa Adams is a 75 y.o. 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 Patient follows up with oncologist Dr. Lorenso Courier.  She had an MRI of the abdomen under anesthesia done on 06/19/2021 which showed innumerable enhancing lesions throughout both lobes of liver concerning for metastatic disease. On 06/28/2021, she was seen by Dr. Lorenso Courier in the office and had a CT abdomen and pelvis done on 8/12.  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  She also had a small to moderate right pleural effusion for which she sees pulmonologist Dr. Erin Fulling.  She had a thoracentesis in May which did not show malignant cells and cytology. On 8/15, she underwent a liver biopsy that showed adenocarcinoma of unknown primary. On 8/23, patient went to Dr. August Albino office for follow-up.  She remains in excruciating pain in her lower back over the past few weeks because of which she has not been able to walk on her own. Because of the severity of her pain, she was sent to the ED for inpatient management. Chest x-ray showed right middle lobe consolidation with moderate right pleural effusion. Patient was admitted to hospitalist service for further evaluation management  Subjective: Patient was seen and examined this morning.   Lying on bed.  Pain controlled.  Under the effect of pain medicine at this time.  On 2 L oxygen by nasal cannula.  No new symptoms.  Slept well last night.  Remains on heparin drip.   Assessment/Plan: Intractable  right-sided lower back pain Pathological fracture of L4 vertebrae due to metastatic disease -8/26, underwent MRI of lumbar spine under anesthesia with C needed because of claustrophobia.  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  Also has multilevel moderate to severe neural foraminal narrowing in the lumbar region.  -Discussed with oncologist Dr. Lorenso Courier, as well as IR.  Patient is a poor candidate for open surgical intervention by neurosurgery.  IR to attempt vertebroplasty on Monday for pain control. -At this time, continue pain control with IV Dilaudid and oral oxycodone as needed. -Palliative care consult appreciated.  Had a family meeting on 8/27.  Patient and family wanted to continue treatment measures at this time keeping her DNR/DNI.  Metastatic adenocarcinoma of unknown primary -Shown on liver biopsy from 8/15. -Unable to go for previously planned outpatient breast biopsy because patient is hospitalized.  IR to try vertebral biopsy at the time of kyphoplasty.  Right pleural effusion -8/26, underwent thoracentesis with removal of 1.2 L of pleural fluid.  Respiratory status stable.  ESRD-HD-MWF -Nephrology consulted -Continue midodrine  Chronic systolic CHF Cardiogenic liver cirrhosis -Last EF 30 to 35%.  Patient was on Toprol-XL as result high dose of Lasix.  With poor oral intake and intermittent hypotension, Lasix is on hold.  Toprol to continue at a lower dose.  A. fib  -Continue Toprol at a lower dose.  Eliquis on hold.  Currently bridged on heparin drip for possible vertebroplasty on  Monday.  Mobility: PT eval after pain control Code Status:   Code Status: DNR  Nutritional status: Body mass index is 36.48 kg/m.     Diet:  Diet Order             Diet NPO time specified Except for: Sips with Meds  Diet effective  midnight           Diet renal with fluid restriction Fluid restriction: 1200 mL Fluid; Room service appropriate? Yes; Fluid consistency: Thin  Diet effective now                  DVT prophylaxis:     Antimicrobials: None Fluid: None Consultants: Oncology, IR Family Communication: Called and updated patient's daughter Ms. [name] on 8/27  Status is: Inpatient  Remains inpatient appropriate because: On heparin drip, pending vertebroplasty  Dispo: The patient is from: Home              Anticipated d/c is to:  Pending vertebroplasty on monday.              Patient currently is not medically stable to d/c.   Difficult to place patient No     Infusions:   heparin 900 Units/hr (07/22/21 0746)    Scheduled Meds:  (feeding supplement) PROSource Plus  30 mL Oral BID BM   allopurinol  300 mg Oral QHS   atorvastatin  80 mg Oral QPM   cinacalcet  30 mg Oral Q supper   [START ON 07/23/2021] doxercalciferol  2 mcg Intravenous Q M,W,F-HD   ferric citrate  420 mg Oral TID WC   ferrous sulfate  325 mg Oral Daily   gabapentin  100 mg Oral TID   metoprolol tartrate  12.5 mg Oral BID   midodrine  10 mg Oral Daily   multivitamin  1 tablet Oral QHS   senna-docusate  1 tablet Oral BID   vitamin B-12  1,000 mcg Oral Daily    Antimicrobials: Anti-infectives (From admission, onward)    None       PRN meds: acetaminophen **OR** acetaminophen, albuterol, HYDROmorphone (DILAUDID) injection, lidocaine, LORazepam, ondansetron **OR** ondansetron (ZOFRAN) IV   Objective: Vitals:   07/22/21 0628 07/22/21 0825  BP: 104/75 109/66  Pulse: 91 99  Resp: 14   Temp: 98.2 F (36.8 C) 97.8 F (36.6 C)  SpO2: 98% 95%    Intake/Output Summary (Last 24 hours) at 07/22/2021 0940 Last data filed at 07/22/2021 0646 Gross per 24 hour  Intake 418.25 ml  Output --  Net 418.25 ml    Filed Weights   07/19/21 1545 07/20/21 0840  Weight: 93.4 kg 93.4 kg   Weight change:  Body mass index is  36.48 kg/m.   Physical Exam: General exam: Pleasant, elderly African-American female.  Pain controlled Skin: No rashes, lesions or ulcers. HEENT: Atraumatic, normocephalic, no obvious bleeding Lungs: Shallow respiratory effort probably because of sedation. CVS: Regular rate and rhythm GI/Abd soft, nontender, nondistended, bowel sound present CNS: Sleepy, opens eyes on verbal command, oriented x3.  Limited mobility in lower extremities because of pain. Psychiatry: Depressed look Extremities: No pedal edema, no calf tenderness  Data Review: I have personally reviewed the laboratory data and studies available.  Recent Labs  Lab 07/17/21 1847 07/20/21 0802 07/20/21 0914 07/21/21 0325 07/22/21 0425  WBC 8.7 8.2  --  6.7 8.0  NEUTROABS 6.9  --   --   --   --   HGB 11.3* 10.3* 10.2* 10.6* 10.6*  HCT  36.9 33.4* 30.0* 34.1* 34.2*  MCV 99.2 99.1  --  100.6* 100.6*  PLT 172 91*  --  113* 111*    Recent Labs  Lab 07/17/21 1847 07/20/21 0914 07/20/21 1700  NA 133* 140  --   K 3.8 3.8  --   CL 95* 100  --   CO2 23  --   --   GLUCOSE 86 86  --   BUN 29* 31*  --   CREATININE 5.81* 4.80*  --   CALCIUM 9.6  --   --   PHOS  --   --  4.8*     F/u labs ordered Unresulted Labs (From admission, onward)     Start     Ordered   07/22/21 1600  APTT  Once-Timed,   TIMED       Question:  Specimen collection method  Answer:  Lab=Lab collect   07/22/21 0744   07/22/21 0500  APTT  Daily,   R     Question:  Specimen collection method  Answer:  Lab=Lab collect   07/20/21 1840   07/22/21 0500  Heparin level (unfractionated)  Daily,   R     Question:  Specimen collection method  Answer:  Lab=Lab collect   07/20/21 1840   07/21/21 0500  CBC  Daily,   R     Question:  Specimen collection method  Answer:  Lab=Lab collect   07/20/21 1840            Signed, Terrilee Croak, MD [facility] 07/22/2021

## 2021-07-22 NOTE — Progress Notes (Addendum)
   Palliative Medicine Inpatient Follow Up Note  Consulting Provider: Terrilee Croak, MD   Reason for consult:   [place] Palliative Medicine Consult  Reason for Consult? GOC    HPI:  Per intake H&P --> Melissa Adams is a 75 y.o. 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 As of July 2022 (+) innumerable enhancing lesions throughout both lobes of liver concerning for metastatic disease. 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  Has been experiencing excruciating pain.    Palliative care has been asked to get involved to further address goals of care and symptom management.   Today's Discussion (07/22/2021):  *Please note that this is a verbal dictation therefore any spelling or grammatical errors are due to the "[place]" system interpretation.  Chart reviewed. VSS. Fair PO's, no BM since admission.   I stopped by [name]'s room this morning. She was noted to be resting and in no discomfort.   I spoke to patients RN, Delcie Roch who has no concerns this morning.   There were no family members present at bedside.   Questions and concerns addressed   Objective Assessment: Vital Signs Vitals:   07/22/21 0628 07/22/21 0825  BP: 104/75 109/66  Pulse: 91 99  Resp: 14   Temp: 98.2 F (36.8 C) 97.8 F (36.6 C)  SpO2: 98% 95%    Intake/Output Summary (Last 24 hours) at 07/22/2021 7829 Last data filed at 07/22/2021 5621 Gross per 24 hour  Intake 418.25 ml  Output --  Net 418.25 ml    Last Weight  Most recent update: 07/20/2021  8:42 AM    Weight  93.4 kg (205 lb 14.6 oz)            Gen:  Elderly AA F in NAD HEENT:  moist mucous membranes CV: Regular rate and irregular rhythm PULM: On 2LPM [place] ABD: soft/nontender  EXT: No edema   SUMMARY OF RECOMMENDATIONS   DNAR/DNI   MOST Completed, paper copy placed onto the chart electric copy can be found in [place]  DNR Form Completed, paper copy placed onto the chart electric copy can be found in [place] on chart just need to be notarized   Ongoing PMT support   Code Status/Advance Care Planning: DNAR/DNI   Symptom Management:  Lower Back Pain d/t metastatic disease: - Gabapentin 100mg  PO TID - Tylenol PRN - Dilaudid 1mg  IV Q3H PRN - last dose yesterday AM  Constipation: - Senna 1 tab PO BID  Muscular Weakness: - PT/OT evaluations  Time Spent: 15 Greater than 50% of the time was spent in counseling and coordination of care ______________________________________________________________________________________ [place] Team Team Cell Phone: [phone] Please utilize secure chat with additional questions, if there is no response within 30 minutes please call the above phone number  Palliative Medicine Team providers are available by phone from 7am to 7pm daily and can be reached through the team cell phone.  Should this patient require assistance outside of these hours, please call the patient's attending physician.

## 2021-07-22 NOTE — Progress Notes (Addendum)
[facility] Progress Note   Subjective:  Patient seen and examined at bedside.  Just finished breakfast.  Denies n/v/d, abdominal pain, SOB, CP, and fatigue.  Back pain is well controlled currently.     Objective Vitals:   07/22/21 0340 07/22/21 0426 07/22/21 0628 07/22/21 0825  BP: (!) 91/57 98/67 104/75 109/66  Pulse: 95 (!) 102 91 99  Resp: 18 16 14    Temp: 98.1 F (36.7 C) 98.9 F (37.2 C) 98.2 F (36.8 C) 97.8 F (36.6 C)  TempSrc: Oral Oral Oral Oral  SpO2: 96% 94% 98% 95%  Weight:      Height:       Physical Exam General:well appearing female in NAD Heart:RRR, no mrg Lungs: breath sounds decreased, nml WOB on 2L via [place] Abdomen:soft, NTND Extremities:trace LE edema Dialysis Access: LU AVF +b/t   Filed Weights   07/19/21 1545 07/20/21 0840  Weight: 93.4 kg 93.4 kg    Intake/Output Summary (Last 24 hours) at 07/22/2021 1044 Last data filed at 07/22/2021 0646 Gross per 24 hour  Intake 418.25 ml  Output --  Net 418.25 ml    Additional Objective Labs: Basic Metabolic Panel: Recent Labs  Lab 07/17/21 1847 07/20/21 0914 07/20/21 1700  NA 133* 140  --   K 3.8 3.8  --   CL 95* 100  --   CO2 23  --   --   GLUCOSE 86 86  --   BUN 29* 31*  --   CREATININE 5.81* 4.80*  --   CALCIUM 9.6  --   --   PHOS  --   --  4.8*   Liver Function Tests: Recent Labs  Lab 07/17/21 1847  AST 58*  ALT 37  ALKPHOS 238*  BILITOT 1.5*  PROT 6.7  ALBUMIN 3.6    CBC: Recent Labs  Lab 07/17/21 1847 07/20/21 0802 07/20/21 0914 07/21/21 0325 07/22/21 0425  WBC 8.7 8.2  --  6.7 8.0  NEUTROABS 6.9  --   --   --   --   HGB 11.3* 10.3* 10.2* 10.6* 10.6*  HCT 36.9 33.4* 30.0* 34.1* 34.2*  MCV 99.2 99.1  --  100.6* 100.6*  PLT 172 91*  --  113* 111*   Blood Culture    Component Value Date/Time   SDES PLEURAL 11/18/2020 1312   SPECREQUEST NONE 11/18/2020 1312   CULT  11/18/2020 1312    NO GROWTH 5 DAYS Performed at [facility]  [address] [place], [place] [address]    REPTSTATUS 11/23/2020 FINAL 11/18/2020 1312    CBG: Recent Labs  Lab 07/19/21 2211  GLUCAP 166*   Studies/Results: MR LUMBAR SPINE WO CONTRAST  Result Date: 07/20/2021 CLINICAL DATA:  Low back pain EXAM: MRI LUMBAR SPINE WITHOUT CONTRAST TECHNIQUE: Multiplanar, multisequence MR imaging of the lumbar spine was performed. No intravenous contrast was administered. COMPARISON:  No prior MRI. FINDINGS: Evaluation is limited by artifact from the degree of edema in the subcutaneous tissues. Segmentation: Standard. Alignment:  Physiologic. Vertebrae: Diffusely decreased T1 and heterogeneously increased T2 signal within the L4 vertebral body and pedicles. Vertebral body height loss, mild bowing of the posterior cortex inferiorly. Additional smaller T1 hypointense, T2 hyperintense lesions in L2 (series 6, images 7 and 9), L3 (series 6, image 9), L5 (series 6, images 7 and 10), and S1 (series 6, images 6 and 10). Multilevel endplate degenerative changes. Conus medullaris and cauda equina: Conus extends to the L1 level. Conus and cauda equina  appear normal. No epidural extension of tumor. Paraspinal and other soft tissues: Multiple T1 and T2 hyperintense lesions in the liver and kidneys. Edema in the subcutaneous tissues. Disc levels: T12-L1: No significant disc bulge. Mild facet arthropathy. No spinal canal stenosis or neural foraminal narrowing. L1-L2: Mild disc bulge. Moderate facet arthropathy. No spinal canal stenosis. No neural foraminal narrowing. L2-L3: Disc height loss with broad-based disc bulge. Moderate severe facet arthropathy and ligamentum flavum hypertrophy. Mild spinal canal stenosis. Moderate right neural foraminal narrowing. L3-L4: Broad-based disc bulge, somewhat eccentric to the right. Moderate facet arthropathy with ligamentum flavum hypertrophy. No spinal canal stenosis. Mild left and severe right neural foraminal narrowing. L4-L5: Mild narrowing of the  spinal canal secondary to L4 pathologic compression fracture. Broad-based disc bulge. Severe facet arthropathy. Ligamentum flavum hypertrophy. Severe spinal canal stenosis. Severe bilateral neural foraminal narrowing. L5-S1: Broad-based disc bulge. Severe facet arthropathy. Ligamentum flavum hypertrophy. No spinal canal stenosis. Moderate bilateral neural foraminal narrowing. IMPRESSION: 1. Evaluation is somewhat limited by artifact caused by the degree of edema in the subcutaneous tissues. Within this limitation, diffusely abnormal marrow signal within the L4 vertebral body with vertebral body height loss, consistent with pathologic compression fracture. In addition to pre-existing degenerative changes, the compression fracture adds mild additional narrowing of the spinal canal. 2. Multiple smaller foci of abnormal signal in L2, L3, L5, and S1, also concerning for metastatic disease. 3. L4-L5 severe spinal canal stenosis and bilateral neural foraminal narrowing, secondary to a combination of pathologic compression fracture and severe degenerative changes. 4. L2-L3 mild spinal canal stenosis and moderate right neural foraminal narrowing. 5. L3-L4 severe right neural foraminal narrowing and L5-S1 moderate bilateral neural foraminal narrowing. Electronically Signed   By: Merilyn Baba M.D.   On: 07/20/2021 12:43    Medications:  heparin 900 Units/hr (07/22/21 0746)    (feeding supplement) PROSource Plus  30 mL Oral BID BM   allopurinol  300 mg Oral QHS   atorvastatin  80 mg Oral QPM   cinacalcet  30 mg Oral Q supper   [START ON 07/23/2021] doxercalciferol  2 mcg Intravenous Q M,W,F-HD   ferric citrate  420 mg Oral TID WC   ferrous sulfate  325 mg Oral Daily   gabapentin  100 mg Oral TID   metoprolol tartrate  12.5 mg Oral BID   midodrine  10 mg Oral Daily   multivitamin  1 tablet Oral QHS   senna-docusate  1 tablet Oral BID   vitamin B-12  1,000 mcg Oral Daily    Dialysis Orders: [facility],MWF,  4hr Edw 88.5,2k, 3ca bath uf2 Hec 2 No Hep Venofer 50  No ESA 2/2 CA       Problem/Plan: Intractable Back Pain with pathologic fracture of L4 due to metastatic carcinoma.  MRI spine with severe degenerative changes of lumbar spine, lesion likely representing metastatic disease, +L4-L5 compression fracture. pain meds/management per admit/oncology ESRD -  HD MWF, off schedule last week due to staffing issues and increased patient census.  Last HD 8/25. Plan for HD tomorrow back on schedule.  Labs and volume stable, follow closely.  R pleural effusion - s/p IR paracentesis 8/26 with 1.2L yield clear amber fluids. CXR with improvement in pleural effusion.  Hypertension/volume - BP well controlled with lopressor 12.5mg  BID and midodrine 10mg  daily.  Does not appear grossly volume overloaded. UF as tolerated.  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 Lorenso Courier oncology noted notes. Palliative care consulted, patient now  DNAR/DNI.  Family meeting yesterday.  Now DNI/DNAR.  Appreciate palliative assistance.  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   Nutrition -albumin 3.6 renal diet  Jen Mow, PA-C [facility] 07/22/2021,10:44 AM  LOS: 3 days   Pt seen, examined and agree w A/P as above.  Kelly Splinter  MD 07/22/2021, 6:23 PM

## 2021-07-22 NOTE — Progress Notes (Signed)
ANTICOAGULATION CONSULT NOTE - Follow Up Consult  Pharmacy Consult for IV Heparin Indication: atrial fibrillation  Allergies  Allergen Reactions   Black Walnut Flavor Hives   Shellfish Allergy Itching    Makes throat itch     Patient Measurements: Height: 5\' 3"  (160 cm) Weight: 93.4 kg (205 lb 14.6 oz) IBW/kg (Calculated) : 52.4 Heparin Dosing Weight: 73 kg  Vital Signs: Temp: 98.2 F (36.8 C) (08/28 0628) Temp Source: Oral (08/28 0628) BP: 104/75 (08/28 0628) Pulse Rate: 91 (08/28 0628)  Labs: Recent Labs    07/20/21 0802 07/20/21 0914 07/20/21 0914 07/20/21 1700 07/21/21 0325 07/21/21 1444 07/22/21 0425  HGB 10.3* 10.2*  --   --  10.6*  --  10.6*  HCT 33.4* 30.0*  --   --  34.1*  --  34.2*  PLT 91*  --   --   --  113*  --  111*  APTT  --   --    < > 134* 131* 62* 49*  HEPARINUNFRC  --   --   --  >1.10* >1.10*  --  >1.10*  CREATININE  --  4.80*  --   --   --   --   --    < > = values in this interval not displayed.   Estimated Creatinine Clearance: 11.2 mL/min (A) (by C-G formula based on SCr of 4.8 mg/dL (H)).  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 Pt has hx of atrial fibrillation, for which she was on apixaban PTA (last dose at 1100 on 8/25). Pt was transitioned to heparin  infusion for planned vertebroplasty.   Repeat aPTT is 63 seconds, closer to therapeutic range. Heparin to stop tomorrow am at 0600 for vertebroplasty.  Goal of Therapy:  Heparin level 0.3-0.7 units/ml aPTT 66-102 seconds Monitor platelets by anticoagulation protocol: Yes   Plan:  Increase heparin to 1000 units/h Stop heparin 8/29 at 0600 F/U [facility] plans postop  Arrie Senate, PharmD, BCPS, [facility] Clinical Pharmacist [phone] Please check AMION for all [place] numbers 07/22/2021

## 2021-07-23 ENCOUNTER — Encounter (HOSPITAL_COMMUNITY): Payer: Self-pay | Admitting: Radiology

## 2021-07-23 ENCOUNTER — Inpatient Hospital Stay (HOSPITAL_COMMUNITY): Payer: Medicare Other

## 2021-07-23 DIAGNOSIS — M4856XA Collapsed vertebra, not elsewhere classified, lumbar region, initial encounter for fracture: Secondary | ICD-10-CM | POA: Diagnosis not present

## 2021-07-23 HISTORY — PX: IR BONE TUMOR(S)RF ABLATION: IMG2284

## 2021-07-23 HISTORY — PX: IR KYPHO LUMBAR INC FX REDUCE BONE BX UNI/BIL CANNULATION INC/IMAGING: IMG5519

## 2021-07-23 LAB — CBC
HCT: 37.3 % (ref 36.0–46.0)
Hemoglobin: 11 g/dL — ABNORMAL LOW (ref 12.0–15.0)
MCH: 29.8 pg (ref 26.0–34.0)
MCHC: 29.5 g/dL — ABNORMAL LOW (ref 30.0–36.0)
MCV: 101.1 fL — ABNORMAL HIGH (ref 80.0–100.0)
Platelets: 115 10*3/uL — ABNORMAL LOW (ref 150–400)
RBC: 3.69 MIL/uL — ABNORMAL LOW (ref 3.87–5.11)
RDW: 17.6 % — ABNORMAL HIGH (ref 11.5–15.5)
WBC: 8.2 10*3/uL (ref 4.0–10.5)
nRBC: 0.2 % (ref 0.0–0.2)

## 2021-07-23 LAB — RENAL FUNCTION PANEL
Albumin: 3 g/dL — ABNORMAL LOW (ref 3.5–5.0)
Anion gap: 11 (ref 5–15)
BUN: 67 mg/dL — ABNORMAL HIGH (ref 8–23)
CO2: 24 mmol/L (ref 22–32)
Calcium: 8.1 mg/dL — ABNORMAL LOW (ref 8.9–10.3)
Chloride: 95 mmol/L — ABNORMAL LOW (ref 98–111)
Creatinine, Ser: 9.11 mg/dL — ABNORMAL HIGH (ref 0.44–1.00)
GFR, Estimated: 4 mL/min — ABNORMAL LOW (ref >60)
Glucose, Bld: 103 mg/dL — ABNORMAL HIGH (ref 70–99)
Phosphorus: 5.8 mg/dL — ABNORMAL HIGH (ref 2.5–4.6)
Potassium: 5.1 mmol/L (ref 3.5–5.1)
Sodium: 130 mmol/L — ABNORMAL LOW (ref 135–145)

## 2021-07-23 LAB — HEPARIN LEVEL (UNFRACTIONATED): Heparin Unfractionated: 1.02 IU/mL — ABNORMAL HIGH (ref 0.30–0.70)

## 2021-07-23 LAB — APTT: aPTT: 95 seconds — ABNORMAL HIGH (ref 24–36)

## 2021-07-23 MED ORDER — BUPIVACAINE HCL (PF) 0.5 % IJ SOLN
INTRAMUSCULAR | Status: AC
  Filled 2021-07-23: qty 30

## 2021-07-23 MED ORDER — FENTANYL CITRATE (PF) 100 MCG/2ML IJ SOLN
INTRAMUSCULAR | Status: AC
  Filled 2021-07-23: qty 2

## 2021-07-23 MED ORDER — ALBUMIN HUMAN 25 % IV SOLN
25.0000 g | Freq: Once | INTRAVENOUS | Status: AC
  Administered 2021-07-23: 25 g via INTRAVENOUS
  Filled 2021-07-23: qty 100

## 2021-07-23 MED ORDER — ALBUMIN HUMAN 25 % IV SOLN
INTRAVENOUS | Status: AC
  Filled 2021-07-23: qty 100

## 2021-07-23 MED ORDER — MIDAZOLAM HCL 2 MG/2ML IJ SOLN
INTRAMUSCULAR | Status: AC | PRN
  Administered 2021-07-23 (×2): 0.5 mg via INTRAVENOUS

## 2021-07-23 MED ORDER — LIDOCAINE HCL 1 % IJ SOLN
INTRAMUSCULAR | Status: AC
  Filled 2021-07-23: qty 20

## 2021-07-23 MED ORDER — IOHEXOL 240 MG/ML SOLN
50.0000 mL | Freq: Once | INTRAMUSCULAR | Status: DC | PRN
  Filled 2021-07-23: qty 50

## 2021-07-23 MED ORDER — MIDAZOLAM HCL 2 MG/2ML IJ SOLN
INTRAMUSCULAR | Status: AC
  Filled 2021-07-23: qty 2

## 2021-07-23 MED ORDER — CEFAZOLIN SODIUM-DEXTROSE 2-4 GM/100ML-% IV SOLN
INTRAVENOUS | Status: AC
  Administered 2021-07-23: 2 g via INTRAVENOUS
  Filled 2021-07-23: qty 100

## 2021-07-23 MED ORDER — FENTANYL CITRATE (PF) 100 MCG/2ML IJ SOLN
INTRAMUSCULAR | Status: AC | PRN
  Administered 2021-07-23: 25 ug via INTRAVENOUS

## 2021-07-23 MED ORDER — HEPARIN (PORCINE) 25000 UT/250ML-% IV SOLN
1100.0000 [IU]/h | INTRAVENOUS | Status: DC
  Administered 2021-07-23: 1000 [IU]/h via INTRAVENOUS
  Filled 2021-07-23: qty 250

## 2021-07-23 NOTE — Sedation Documentation (Signed)
Advised nurse that the back may have drainage following this procedure and to check.  Area at time of discharge from IR dressing was clean and intact.

## 2021-07-23 NOTE — Progress Notes (Signed)
It appears that the patient was able to undergo vertebral augmentation today. We will plan to see her in our outpt clinic in the next week or so to discuss possible options of palliative radiotherapy.     Carola Rhine, PAC

## 2021-07-23 NOTE — Progress Notes (Signed)
This chaplain attempted spiritual care visit to facilitate notarizing of he Pt. Advance Directive.  The chaplain understands the Pt. is in dialysis at the time of the visit.    This chaplain will F/U with the Pt.

## 2021-07-23 NOTE — Procedures (Signed)
INTERVENTIONAL NEURORADIOLOGY BRIEF POSTPROCEDURE NOTE  FLUOROSCOPY GUIDED CORE BONE BIOPSY, RFA AND BALLOON KYPHOPLASTY OF L4  Attending: Dr. Pedro Earls  Assistant: None.  Diagnosis: L4 pathologic fracture  Access site: Percutaneous, bilateral transpedicular  Anesthesia: Moderate sedation  Medication used: 1 mg Versed IV; 50 mcg Fentanyl IV.  Complications: None immediate.  Estimated blood loss: negligible.  Specimen: 2 core biopsies of L4 sent to lab in formalin.   Patient with pathologic fracture and intractable pain. Two core bone biopsy samples were obtained, one from each vertebral body side. This was followed by FRA and balloon kyphoplasty.   The patient tolerated the procedure well without incident or complication and is in stable condition.

## 2021-07-23 NOTE — Progress Notes (Signed)
[facility] ROUNDING NOTE   Subjective:   Interval History: This is a 75 year old lady end-stage renal disease Monday Wednesday Friday dialysis.  Last dialysis treatment was 07/19/2021.  Next dialysis treatment will be 07/23/2021.  She was admitted with intractable back pain pathological fracture L4 due to metastatic carcinoma.  She has metastatic disease to the liver and spine with questionable breast primary source.  She is currently being followed by palliative medicine.  She has a history of cardiomyopathy with ejection fraction 30 to 35% and atrial fibrillation which is rate controlled.  She is not requiring any ESA due to the history of cancer   Blood pressure 100/87 pulse 98 temperature 98 O2 sats 98% 2 L nasal cannula.  Last dialysis was 07/19/2024 with 1 L removed   Sodium 140 potassium 3.8 chloride 100 BUN 31 creatinine 4.8 hemoglobin 11.0      Dialysis Orders: [facility],MWF, 4hr Edw 88.5,2k, 3ca bath uf2 Hec 2 No Hep Venofer 50  No ESA 2/2 CA    Objective:  Vital signs in last 24 hours:  Temp:  [97.6 F (36.4 C)-98.8 F (37.1 C)] 98 F (36.7 C) (08/29 0653) Pulse Rate:  [54-105] 98 (08/29 0653) Resp:  [17-20] 20 (08/29 0653) BP: (93-118)/(55-87) 100/87 (08/29 0653) SpO2:  [95 %-100 %] 98 % (08/29 0653)  Weight change:  Filed Weights   07/19/21 1545 07/20/21 0840  Weight: 93.4 kg 93.4 kg    Intake/Output: I/O last 3 completed shifts: In: 441.3 [P.O.:360; I.V.:81.3] Out: -    Intake/Output this shift:  No intake/output data recorded.  CVS- RRR RS- CTAGeneral:well appearing female in NAD Heart:RRR, no mrg Lungs: breath sounds decreased, nml WOB on 2L via [place] Abdomen:soft, NTND Extremities:trace LE edema Dialysis Access: LU AVF +b/t  ABD- BS present soft non-distended EXT- no edema   Basic Metabolic Panel: Recent Labs  Lab 07/17/21 1847 07/20/21 0914 07/20/21 1700  NA 133* 140  --   K 3.8 3.8  --   CL 95* 100  --   CO2 23  --   --    GLUCOSE 86 86  --   BUN 29* 31*  --   CREATININE 5.81* 4.80*  --   CALCIUM 9.6  --   --   PHOS  --   --  4.8*    Liver Function Tests: Recent Labs  Lab 07/17/21 1847  AST 58*  ALT 37  ALKPHOS 238*  BILITOT 1.5*  PROT 6.7  ALBUMIN 3.6   No results for input(s): LIPASE, AMYLASE in the last 168 hours. No results for input(s): AMMONIA in the last 168 hours.  CBC: Recent Labs  Lab 07/17/21 1847 07/20/21 0802 07/20/21 0914 07/21/21 0325 07/22/21 0425 07/23/21 0442  WBC 8.7 8.2  --  6.7 8.0 8.2  NEUTROABS 6.9  --   --   --   --   --   HGB 11.3* 10.3* 10.2* 10.6* 10.6* 11.0*  HCT 36.9 33.4* 30.0* 34.1* 34.2* 37.3  MCV 99.2 99.1  --  100.6* 100.6* 101.1*  PLT 172 91*  --  113* 111* 115*    Cardiac Enzymes: No results for input(s): CKTOTAL, CKMB, CKMBINDEX, TROPONINI in the last 168 hours.  BNP: Invalid input(s): POCBNP  CBG: Recent Labs  Lab 07/19/21 2211  GLUCAP 166*    Microbiology: Results for orders placed or performed during the hospital encounter of 07/17/21  Resp Panel by RT-PCR (Flu A&B, Covid) Nasopharyngeal Swab     Status: None  Collection Time: 07/17/21  7:12 PM   Specimen: Nasopharyngeal Swab; Nasopharyngeal(NP) swabs in vial transport medium  Result Value Ref Range Status   SARS Coronavirus 2 by RT PCR NEGATIVE NEGATIVE Final    Comment: (NOTE) SARS-CoV-2 target nucleic acids are NOT DETECTED.  The SARS-CoV-2 RNA is generally detectable in upper respiratory specimens during the acute phase of infection. The lowest concentration of SARS-CoV-2 viral copies this assay can detect is 138 copies/mL. A negative result does not preclude SARS-Cov-2 infection and should not be used as the sole basis for treatment or other patient management decisions. A negative result may occur with  improper specimen collection/handling, submission of specimen other than nasopharyngeal swab, presence of viral mutation(s) within the areas targeted by this assay, and  inadequate number of viral copies(<138 copies/mL). A negative result must be combined with clinical observations, patient history, and epidemiological information. The expected result is Negative.  Fact Sheet for Patients:  EntrepreneurPulse.com.au  Fact Sheet for Healthcare Providers:  IncredibleEmployment.be  This test is no t yet approved or cleared by the [place] FDA and  has been authorized for detection and/or diagnosis of SARS-CoV-2 by FDA under an Emergency Use Authorization (EUA). This EUA will remain  in effect (meaning this test can be used) for the duration of the COVID-19 declaration under Section 564(b)(1) of the Act, 21 U.S.C.section 360bbb-3(b)(1), unless the authorization is terminated  or revoked sooner.       Influenza A by PCR NEGATIVE NEGATIVE Final   Influenza B by PCR NEGATIVE NEGATIVE Final    Comment: (NOTE) The Xpert Xpress SARS-CoV-2/FLU/RSV plus assay is intended as an aid in the diagnosis of influenza from Nasopharyngeal swab specimens and should not be used as a sole basis for treatment. Nasal washings and aspirates are unacceptable for Xpert Xpress SARS-CoV-2/FLU/RSV testing.  Fact Sheet for Patients: EntrepreneurPulse.com.au  Fact Sheet for Healthcare Providers: IncredibleEmployment.be  This test is not yet approved or cleared by the [place] FDA and has been authorized for detection and/or diagnosis of SARS-CoV-2 by FDA under an Emergency Use Authorization (EUA). This EUA will remain in effect (meaning this test can be used) for the duration of the COVID-19 declaration under Section 564(b)(1) of the Act, 21 U.S.C. section 360bbb-3(b)(1), unless the authorization is terminated or revoked.  Performed at [facility], [place] [address] [place], [place] [address]   MRSA Next Gen by PCR, Nasal     Status: None   Collection Time: 07/18/21 11:23 PM    Specimen: Nasal Mucosa; Nasal Swab  Result Value Ref Range Status   MRSA by PCR Next Gen NOT DETECTED NOT DETECTED Final    Comment: (NOTE) The GeneXpert MRSA Assay (FDA approved for NASAL specimens only), is one component of a comprehensive MRSA colonization surveillance program. It is not intended to diagnose MRSA infection nor to guide or monitor treatment for MRSA infections. Test performance is not FDA approved in patients less than 38 years old. Performed at [facility], [place] [address], [place], [place] [address]     Coagulation Studies: No results for input(s): LABPROT, INR in the last 72 hours.  Urinalysis: No results for input(s): COLORURINE, LABSPEC, PHURINE, GLUCOSEU, HGBUR, BILIRUBINUR, KETONESUR, PROTEINUR, UROBILINOGEN, NITRITE, LEUKOCYTESUR in the last 72 hours.  Invalid input(s): APPERANCEUR    Imaging: No results found.   Medications:     ceFAZolin (ANCEF) IV      (feeding supplement) PROSource Plus  30 mL Oral BID BM   allopurinol  300 mg Oral QHS   atorvastatin  80 mg Oral QPM   cinacalcet  30 mg Oral Q supper   doxercalciferol  2 mcg Intravenous Q M,W,F-HD   ferric citrate  420 mg Oral TID WC   ferrous sulfate  325 mg Oral Daily   gabapentin  100 mg Oral TID   metoprolol tartrate  12.5 mg Oral BID   midodrine  10 mg Oral Daily   multivitamin  1 tablet Oral QHS   senna-docusate  1 tablet Oral BID   vitamin B-12  1,000 mcg Oral Daily   acetaminophen **OR** acetaminophen, albuterol, HYDROmorphone (DILAUDID) injection, lidocaine, LORazepam, ondansetron **OR** ondansetron (ZOFRAN) IV  Assessment/ Plan:  Intractable Back Pain with pathologic fracture of L4 due to metastatic carcinoma.  MRI spine with severe degenerative changes of lumbar spine, lesion likely representing metastatic disease, +L4-L5 compression fracture. pain meds/management per admit/oncology ESRD -  HD MWF, off schedule last week due to staffing issues and increased patient  census.  Last HD 8/25. Plan for HD tomorrow back on schedule.  Labs and volume stable, follow closely.  R pleural effusion - s/p IR paracentesis 8/26 with 1.2L yield clear amber fluids. CXR with improvement in pleural effusion.  Hypertension/volume - BP well controlled with lopressor 12.5mg  BID and midodrine 10mg  daily.  Does not appear grossly volume overloaded. UF as tolerated.  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 Lorenso Courier oncology noted notes. Palliative care consulted, patient now DNAR/DNI.  Family meeting yesterday.  Now DNI/DNAR.  Appreciate palliative assistance.  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     LOS: [place] @TODAY @7 :51 AM

## 2021-07-23 NOTE — Progress Notes (Signed)
Called to bring pt down for KP. Pt presently in Dialysis. Will try to bring down later if schedule permits

## 2021-07-23 NOTE — Progress Notes (Signed)
[place] for IV heparin Indication: atrial fibrillation  Allergies  Allergen Reactions   Black Walnut Flavor Hives   Shellfish Allergy Itching    Makes throat itch     Patient Measurements: Height: 5\' 3"  (160 cm) Weight: 94.9 kg (209 lb 3.5 oz) IBW/kg (Calculated) : 52.4 Heparin Dosing Weight: 73 kg  Vital Signs: Temp: 98 F (36.7 C) (08/29 1250) Temp Source: Oral (08/29 1250) BP: 108/72 (08/29 1450) Pulse Rate: 100 (08/29 1450)  Labs: Recent Labs    07/21/21 0325 07/21/21 1444 07/22/21 0425 07/22/21 1611 07/23/21 0442 07/23/21 0723  HGB 10.6*  --  10.6*  --  11.0*  --   HCT 34.1*  --  34.2*  --  37.3  --   PLT 113*  --  111*  --  115*  --   APTT 131*   < > 49* 63* 95*  --   HEPARINUNFRC >1.10*  --  >1.10*  --  1.02*  --   CREATININE  --   --   --   --   --  9.11*   < > = values in this interval not displayed.    Estimated Creatinine Clearance: 5.9 mL/min (A) (by C-G formula based on SCr of 9.11 mg/dL (H)).   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 Pt has hx of atrial fibrillation, for which she was on apixaban PTA (last dose at 1100 on 8/25). Pt was transitioned to heparin infusion for planned vertebroplasty.    aPTT was therapeutic at 95 sec this morning prior to being turned off for a procedure. She is now s/p bone biopsy + L4 kyphoplasty. Pharmacy consulted to resume IV heparin - will resume 6 hrs post-op per protocol. CBC is stable, no bleeding noted.  Goal of Therapy:  Heparin level 0.3-0.7 units/ml aPTT 66-102 seconds Monitor platelets by anticoagulation protocol: Yes   Plan:  Resume IV heparin drip at 1000 units/hr with no bolus at 21:00 8h heparin level Daily heparin level and CBC Monitor for s/sx of bleeding F/U resuming apixaban  Thank you for involving pharmacy in this  patient's care.  Renold Genta, PharmD, BCPS Clinical Pharmacist Clinical phone for 07/23/2021 until 10p is [phone] 07/23/2021 3:12 PM  **Pharmacist phone directory can be found on [email or web address] listed under [place]**

## 2021-07-23 NOTE — Progress Notes (Signed)
PROGRESS NOTE  Melissa Adams  DOB: [birth year]  PCP: [name], NP ZOX:[id number]  DOA: 07/17/2021  LOS: 4 days  Hospital Day: 7   Chief complaint: Back pain  Brief narrative: Melissa Adams is a 75 y.o. 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 Patient follows up with oncologist Dr. Lorenso Courier.  She had an MRI of the abdomen under anesthesia done on 06/19/2021 which showed innumerable enhancing lesions throughout both lobes of liver concerning for metastatic disease. On 06/28/2021, she was seen by Dr. Lorenso Courier in the office and had a CT abdomen and pelvis done on 8/12.  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  She also had a small to moderate right pleural effusion for which she sees pulmonologist Dr. Erin Fulling.  She had a thoracentesis in May which did not show malignant cells and cytology. On 8/15, she underwent a liver biopsy that showed adenocarcinoma of unknown primary. On 8/23, patient went to Dr. August Albino office for follow-up.  She remains in excruciating pain in her lower back over the past few weeks because of which she has not been able to walk on her own. Because of the severity of her pain, she was sent to the ED for inpatient management. Chest x-ray showed right middle lobe consolidation with moderate right pleural effusion. Patient was admitted to hospitalist service for further evaluation management  Subjective: Patient was seen and examined this morning.   Lying down in bed. Partially awake.  Looks depressed today.  Planned for vertebroplasty today. Remains on heparin drip.  Pain controlled.   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  Also has multilevel moderate to severe neural foraminal narrowing in the lumbar region.  -Discussed with oncologist Dr. Lorenso Courier, as well as IR.  Patient is a poor candidate for open surgical intervention by neurosurgery.  IR to attempt vertebroplasty today for pain control. -At this time, continue pain control with IV Dilaudid and oral oxycodone as needed. -Palliative care consult appreciated.  Had a family meeting on 8/27.  Patient and family wanted to continue treatment measures at this time keeping her DNR/DNI.  Metastatic adenocarcinoma of unknown primary -Shown on liver biopsy from 8/15. -Unable to go for previously planned outpatient breast biopsy because patient is hospitalized.  IR to try vertebral biopsy at the time of kyphoplasty today.  Right pleural effusion -8/26, underwent thoracentesis with removal of 1.2 L of pleural fluid.  Respiratory status stable.  ESRD-HD-MWF -Nephrology consulted -Continue midodrine  Chronic systolic CHF Cardiogenic liver cirrhosis -Last EF 30 to 35%.  Patient was on Toprol-XL as result high dose of Lasix.  With poor oral intake and intermittent hypotension, Lasix is on hold.  Toprol to continue at a lower dose.  A. fib  -Continue Toprol at a lower dose.  Eliquis on hold.  Currently bridged on heparin drip for possible vertebroplasty today.  Mobility: PT eval after pain control Code Status:   Code Status: DNR  Nutritional  status: Body mass index is 37.06 kg/m.     Diet:  Diet Order             Diet NPO time specified Except for: Sips with Meds  Diet effective midnight                  DVT prophylaxis:      Antimicrobials: None Fluid: None Consultants: Oncology, IR Family Communication: 1: Updated patient's daughter later this afternoon after vertebroplasty.  Status is: Inpatient  Remains inpatient appropriate because: On heparin drip, pending vertebroplasty  Dispo: The patient is from: Home              Anticipated d/c is to:  Pending vertebroplasty on monday.              Patient currently is not medically stable to d/c.   Difficult to place patient No     Infusions:   albumin human      ceFAZolin (ANCEF) IV      Scheduled Meds:  (feeding supplement) PROSource Plus  30 mL Oral BID BM   allopurinol  300 mg Oral QHS   atorvastatin  80 mg Oral QPM   cinacalcet  30 mg Oral Q supper   doxercalciferol  2 mcg Intravenous Q M,W,F-HD   ferric citrate  420 mg Oral TID WC   ferrous sulfate  325 mg Oral Daily   gabapentin  100 mg Oral TID   metoprolol tartrate  12.5 mg Oral BID   midodrine  10 mg Oral Daily   multivitamin  1 tablet Oral QHS   senna-docusate  1 tablet Oral BID   vitamin B-12  1,000 mcg Oral Daily    Antimicrobials: Anti-infectives (From admission, onward)    Start     Dose/Rate Route Frequency Ordered Stop   07/23/21 0000  ceFAZolin (ANCEF) IVPB 2g/100 mL premix        2 g 200 mL/hr over 30 Minutes Intravenous On call 07/22/21 1422 07/24/21 0000       PRN meds: acetaminophen **OR** acetaminophen, albuterol, HYDROmorphone (DILAUDID) injection, lidocaine, LORazepam, ondansetron **OR** ondansetron (ZOFRAN) IV   Objective: Vitals:   07/23/21 1000 07/23/21 1030  BP: 119/65 (!) 120/45  Pulse: 80 88  Resp: 20 20  Temp:    SpO2:      Intake/Output Summary (Last 24 hours) at 07/23/2021 1039 Last data filed at 07/22/2021 1440 Gross per 24 hour  Intake 120 ml  Output --  Net 120 ml    Filed Weights   07/19/21 1545 07/20/21 0840 07/23/21 0900  Weight: 93.4 kg 93.4 kg 94.9 kg   Weight change:  Body mass index is 37.06 kg/m.   Physical Exam: General  exam: Pleasant, elderly African-American female.  Pain controlled Skin: No rashes, lesions or ulcers. HEENT: Atraumatic, normocephalic, no obvious bleeding Lungs: Clear to auscultation bilaterally CVS: Regular rate and rhythm, no murmur GI/Abd soft, nontender, nondistended, bowel sound present CNS: Sleepy, opens eyes on verbal command, oriented x3.   Psychiatry: Depressed look Extremities: No pedal edema, no calf tenderness  Data Review: I have personally reviewed the laboratory data and studies available.  Recent Labs  Lab 07/17/21 1847 07/20/21 0802 07/20/21 0914 07/21/21 0325 07/22/21 0425 07/23/21 0442  WBC 8.7 8.2  --  6.7 8.0 8.2  NEUTROABS 6.9  --   --   --   --   --   HGB 11.3* 10.3* 10.2* 10.6* 10.6* 11.0*  HCT 36.9 33.4* 30.0* 34.1* 34.2*  37.3  MCV 99.2 99.1  --  100.6* 100.6* 101.1*  PLT 172 91*  --  113* 111* 115*    Recent Labs  Lab 07/17/21 1847 07/20/21 0914 07/20/21 1700 07/23/21 0723  NA 133* 140  --  130*  K 3.8 3.8  --  5.1  CL 95* 100  --  95*  CO2 23  --   --  24  GLUCOSE 86 86  --  103*  BUN 29* 31*  --  67*  CREATININE 5.81* 4.80*  --  9.11*  CALCIUM 9.6  --   --  8.1*  PHOS  --   --  4.8* 5.8*     F/u labs ordered Unresulted Labs (From admission, onward)     Start     Ordered   07/22/21 0500  APTT  Daily,   R     Question:  Specimen collection method  Answer:  Lab=Lab collect   07/20/21 1840   07/22/21 0500  Heparin level (unfractionated)  Daily,   R     Question:  Specimen collection method  Answer:  Lab=Lab collect   07/20/21 1840   07/21/21 0500  CBC  Daily,   R     Question:  Specimen collection method  Answer:  Lab=Lab collect   07/20/21 1840            Signed, Terrilee Croak, MD [facility] 07/23/2021

## 2021-07-23 NOTE — Progress Notes (Signed)
PT Cancellation Note  Patient Details Name: AHJA MARTELLO MRN: [id number] DOB: 1946-02-16   Cancelled Treatment:    Reason Eval/Treat Not Completed: Patient at procedure or test/unavailable - at HD, plan for KP later today. Will check back.  Stacie Glaze, PT DPT Acute Rehabilitation Services Pager [phone]  Office [phone]     [facility] Ruffin Pyo 07/23/2021, 10:06 AM

## 2021-07-23 NOTE — Progress Notes (Signed)
OT Cancellation Note  Patient Details Name: Melissa Adams MRN: [id number] DOB: Jun 01, 1946   Cancelled Treatment:    Reason Eval/Treat Not Completed: Patient at procedure or test/ unavailable. Pt currently in hemodialysis.  [place], OTR/L Acute Rehab Services Pager [phone] Office [phone]    Almon Register 07/23/2021, 1:33 PM

## 2021-07-24 DIAGNOSIS — M4856XA Collapsed vertebra, not elsewhere classified, lumbar region, initial encounter for fracture: Secondary | ICD-10-CM | POA: Diagnosis not present

## 2021-07-24 LAB — BASIC METABOLIC PANEL
Anion gap: 11 (ref 5–15)
BUN: 39 mg/dL — ABNORMAL HIGH (ref 8–23)
CO2: 25 mmol/L (ref 22–32)
Calcium: 7.6 mg/dL — ABNORMAL LOW (ref 8.9–10.3)
Chloride: 94 mmol/L — ABNORMAL LOW (ref 98–111)
Creatinine, Ser: 6.5 mg/dL — ABNORMAL HIGH (ref 0.44–1.00)
GFR, Estimated: 6 mL/min — ABNORMAL LOW (ref >60)
Glucose, Bld: 89 mg/dL (ref 70–99)
Potassium: 4.1 mmol/L (ref 3.5–5.1)
Sodium: 130 mmol/L — ABNORMAL LOW (ref 135–145)

## 2021-07-24 LAB — CBC
HCT: 33.1 % — ABNORMAL LOW (ref 36.0–46.0)
Hemoglobin: 9.9 g/dL — ABNORMAL LOW (ref 12.0–15.0)
MCH: 30 pg (ref 26.0–34.0)
MCHC: 29.9 g/dL — ABNORMAL LOW (ref 30.0–36.0)
MCV: 100.3 fL — ABNORMAL HIGH (ref 80.0–100.0)
Platelets: 97 10*3/uL — ABNORMAL LOW (ref 150–400)
RBC: 3.3 MIL/uL — ABNORMAL LOW (ref 3.87–5.11)
RDW: 17.5 % — ABNORMAL HIGH (ref 11.5–15.5)
WBC: 7.1 10*3/uL (ref 4.0–10.5)
nRBC: 0 % (ref 0.0–0.2)

## 2021-07-24 LAB — APTT: aPTT: 65 seconds — ABNORMAL HIGH (ref 24–36)

## 2021-07-24 LAB — HEPARIN LEVEL (UNFRACTIONATED): Heparin Unfractionated: 0.71 IU/mL — ABNORMAL HIGH (ref 0.30–0.70)

## 2021-07-24 MED ORDER — CHLORHEXIDINE GLUCONATE CLOTH 2 % EX PADS
6.0000 | MEDICATED_PAD | Freq: Every day | CUTANEOUS | Status: DC
  Administered 2021-07-24 – 2021-07-31 (×7): 6 via TOPICAL

## 2021-07-24 MED ORDER — APIXABAN 5 MG PO TABS
5.0000 mg | ORAL_TABLET | Freq: Two times a day (BID) | ORAL | Status: DC
  Administered 2021-07-24 – 2021-07-31 (×15): 5 mg via ORAL
  Filled 2021-07-24 (×16): qty 1

## 2021-07-24 MED ORDER — OXYCODONE HCL 5 MG PO TABS
5.0000 mg | ORAL_TABLET | Freq: Four times a day (QID) | ORAL | Status: DC | PRN
  Administered 2021-07-29: 5 mg via ORAL
  Filled 2021-07-24: qty 1

## 2021-07-24 MED ORDER — LACTATED RINGERS IV BOLUS
250.0000 mL | Freq: Once | INTRAVENOUS | Status: AC
  Administered 2021-07-24: 250 mL via INTRAVENOUS

## 2021-07-24 NOTE — Progress Notes (Signed)
Referring Physician(s): Dr. Marlowe Aschoff Dahal  Supervising Physician: Pedro Earls  Patient Status:  Melissa Adams - In-pt  Chief Complaint: Follow up L4 bone biopsy/FRA/KP 07/23/21  Subjective:  [name] seen in her room with Dr Tennis Must Sindy Messing, she is sleeping and arouses to loud verbal cues and touch. She is a poor historian but is able to state that she feels "a little bit better" today. She is able to follow basic commands appropriately. Per RN no obvious bleeding from puncture site noted.  Allergies: Black walnut flavor and Shellfish allergy  Medications: Prior to Admission medications   Medication Sig Start Date End Date Taking? Authorizing Provider  acetaminophen (TYLENOL) 325 MG tablet Take 2 tablets (650 mg total) by mouth every 4 (four) hours as needed for headache or mild pain. 11/05/19  Yes Buriev, Arie Sabina, MD  albuterol (VENTOLIN HFA) 108 (90 Base) MCG/ACT inhaler Inhale 2 puffs into the lungs every 6 (six) hours as needed for wheezing or shortness of breath. 11/30/19  Yes [name], NP  cetirizine (ZYRTEC) 10 MG tablet Take 10 mg by mouth daily as needed for allergies.   Yes [provider]  cinacalcet (SENSIPAR) 30 MG tablet Take 30 mg by mouth daily with supper.  04/18/20  Yes [provider]  ferric citrate (AURYXIA) 1 GM 210 MG(Fe) tablet Take 420 mg by mouth 3 (three) times daily with meals.   Yes [provider]  ferrous sulfate 324 MG TBEC Take 324 mg by mouth daily.   Yes [provider]  furosemide (LASIX) 80 MG tablet Take 160 mg by mouth 2 (two) times daily. 06/19/21  Yes [provider]  gabapentin (NEURONTIN) 100 MG capsule Take 1 capsule (100 mg total) by mouth 3 (three) times daily. 04/24/21  Yes [name], NP  lidocaine-prilocaine (EMLA) cream Apply 1 application topically See admin instructions. Apply topically to port access one hour prior to dialysis - Monday, Wednesday, Friday,  Saturday 04/07/20  Yes [provider]  midodrine (PROAMATINE) 10 MG tablet Take 1 tablet (10 mg total) by mouth daily. 11/20/20  Yes Pahwani, Michell Heinrich, MD  Multiple Vitamins-Minerals (MULTIVITAMIN WITH MINERALS) tablet Take 1 tablet by mouth daily.   Yes [provider]  Nutritional Supplements (,FEEDING SUPPLEMENT, PROSOURCE PLUS) liquid Take 30 mLs by mouth 2 (two) times daily between meals. 11/19/20  Yes Pahwani, Rinka R, MD  vitamin B-12 (CYANOCOBALAMIN) 1000 MCG tablet Take 1,000 mcg by mouth daily.   Yes [provider]  vitamin C (VITAMIN C) 500 MG tablet Take 1 tablet (500 mg total) by mouth daily. 11/06/19  Yes Buriev, Arie Sabina, MD  allopurinol (ZYLOPRIM) 300 MG tablet Take 1 tablet (300 mg total) by mouth at bedtime. 07/20/21   [name], MD  apixaban (ELIQUIS) 5 MG TABS tablet Take 1 tablet (5 mg total) by mouth 2 (two) times daily. 07/20/21   [name], MD  atorvastatin (LIPITOR) 80 MG tablet Take 1 tablet (80 mg total) by mouth at bedtime. 07/20/21   [name], MD  B Complex-C-Zn-Folic Acid (DIALYVITE 229-NLGX 15) 0.8 MG TABS Take 1 tablet by mouth daily. 07/20/21   [name], MD  metoprolol succinate (TOPROL-XL) 25 MG 24 hr tablet TAKE 1 TABLET BY MOUTH EVERY MORNING & TAKE 1 TABLET EVERY NIGHT AT BEDTIME WITH OR IMMEDIATELY FOLLOWING MEAL 07/20/21   [name], MD  triamcinolone 0.1% oint-Eucerin equivalent cream 1:1 mixture Apply topically 3 (three)  times daily as needed. APPLY TO DRY AFFECTED AREAS AS NEEDED 3 TIMES ADAY Patient taking differently: Apply 1 application topically 3 (three) times daily as needed for irritation, itching or rash. 04/24/21   [name], NP     Vital Signs: BP 109/66   Pulse (!) 108   Temp 98.2 F (36.8 C) (Oral)   Resp 20   Ht 5\' 3"  (1.6 m)   Wt 209 lb 3.5 oz (94.9 kg)   SpO2 95%   BMI 37.06 kg/m   Physical Exam Vitals and nursing note reviewed.  Constitutional:      General:  She is not in acute distress. HENT:     Head: Normocephalic.  Cardiovascular:     Rate and Rhythm: Tachycardia present.  Pulmonary:     Effort: Pulmonary effort is normal.  Musculoskeletal:     Comments: (+) lumbar puncture site clean, dry, dressed appropriately. No active bleeding, minimal bruising, non tender to palpation.  Skin:    General: Skin is warm and dry.  Neurological:     Mental Status: She is alert.     Comments: Able to lift both legs when asked, sensation to touch in tact BLE.    Imaging: MR LUMBAR SPINE WO CONTRAST  Result Date: 07/20/2021 CLINICAL DATA:  Low back pain EXAM: MRI LUMBAR SPINE WITHOUT CONTRAST TECHNIQUE: Multiplanar, multisequence MR imaging of the lumbar spine was performed. No intravenous contrast was administered. COMPARISON:  No prior MRI. FINDINGS: Evaluation is limited by artifact from the degree of edema in the subcutaneous tissues. Segmentation: Standard. Alignment:  Physiologic. Vertebrae: Diffusely decreased T1 and heterogeneously increased T2 signal within the L4 vertebral body and pedicles. Vertebral body height loss, mild bowing of the posterior cortex inferiorly. Additional smaller T1 hypointense, T2 hyperintense lesions in L2 (series 6, images 7 and 9), L3 (series 6, image 9), L5 (series 6, images 7 and 10), and S1 (series 6, images 6 and 10). Multilevel endplate degenerative changes. Conus medullaris and cauda equina: Conus extends to the L1 level. Conus and cauda equina appear normal. No epidural extension of tumor. Paraspinal and other soft tissues: Multiple T1 and T2 hyperintense lesions in the liver and kidneys. Edema in the subcutaneous tissues. Disc levels: T12-L1: No significant disc bulge. Mild facet arthropathy. No spinal canal stenosis or neural foraminal narrowing. L1-L2: Mild disc bulge. Moderate facet arthropathy. No spinal canal stenosis. No neural foraminal narrowing. L2-L3: Disc height loss with broad-based disc bulge. Moderate severe  facet arthropathy and ligamentum flavum hypertrophy. Mild spinal canal stenosis. Moderate right neural foraminal narrowing. L3-L4: Broad-based disc bulge, somewhat eccentric to the right. Moderate facet arthropathy with ligamentum flavum hypertrophy. No spinal canal stenosis. Mild left and severe right neural foraminal narrowing. L4-L5: Mild narrowing of the spinal canal secondary to L4 pathologic compression fracture. Broad-based disc bulge. Severe facet arthropathy. Ligamentum flavum hypertrophy. Severe spinal canal stenosis. Severe bilateral neural foraminal narrowing. L5-S1: Broad-based disc bulge. Severe facet arthropathy. Ligamentum flavum hypertrophy. No spinal canal stenosis. Moderate bilateral neural foraminal narrowing. IMPRESSION: 1. Evaluation is somewhat limited by artifact caused by the degree of edema in the subcutaneous tissues. Within this limitation, diffusely abnormal marrow signal within the L4 vertebral body with vertebral body height loss, consistent with pathologic compression fracture. In addition to pre-existing degenerative changes, the compression fracture adds mild additional narrowing of the spinal canal. 2. Multiple smaller foci of abnormal signal in L2, L3, L5, and S1, also concerning for metastatic disease. 3. L4-L5 severe spinal canal stenosis and  bilateral neural foraminal narrowing, secondary to a combination of pathologic compression fracture and severe degenerative changes. 4. L2-L3 mild spinal canal stenosis and moderate right neural foraminal narrowing. 5. L3-L4 severe right neural foraminal narrowing and L5-S1 moderate bilateral neural foraminal narrowing. Electronically Signed   By: Merilyn Baba M.D.   On: 07/20/2021 12:43    Labs:  CBC: Recent Labs    07/21/21 0325 07/22/21 0425 07/23/21 0442 07/24/21 0427  WBC 6.7 8.0 8.2 7.1  HGB 10.6* 10.6* 11.0* 9.9*  HCT 34.1* 34.2* 37.3 33.1*  PLT 113* 111* 115* 97*    COAGS: Recent Labs    08/31/20 1637  11/14/20 0015 05/10/21 1501 07/09/21 1130 07/20/21 1700 07/22/21 0425 07/22/21 1611 07/23/21 0442 07/24/21 0427  INR 1.8* 1.4* CANCELED 1.2  --   --   --   --   --   APTT  --   --   --   --    < > 49* 63* 95* 65*   < > = values in this interval not displayed.    BMP: Recent Labs    07/09/21 1130 07/17/21 1847 07/20/21 0914 07/23/21 0723 07/24/21 0427  NA 135 133* 140 130* 130*  K 4.2 3.8 3.8 5.1 4.1  CL 91* 95* 100 95* 94*  CO2 28 23  --  24 25  GLUCOSE 81 86 86 103* 89  BUN 63* 29* 31* 67* 39*  CALCIUM 9.4 9.6  --  8.1* 7.6*  CREATININE 6.57* 5.81* 4.80* 9.11* 6.50*  GFRNONAA 6* 7*  --  4* 6*    LIVER FUNCTION TESTS: Recent Labs    05/10/21 1501 06/19/21 0547 07/09/21 1130 07/17/21 1847 07/23/21 0723  BILITOT 0.8 1.1 1.2 1.5*  --   AST 26 52* 54* 58*  --   ALT 19 37 49* 37  --   ALKPHOS 251* 215* 278* 238*  --   PROT 6.6 6.5 7.1 6.7  --   ALBUMIN 3.9 3.4* 3.7 3.6 3.0*    Assessment and Plan:  74 y/o F with L4 pathologic fracture s/p L4 biopsy and FRA/balloon kyphoplasty 8/29 with Dr Tennis Must Sindy Messing seen today for follow up.  Patient is a poor historian at baseline but is able to tell us she feels a little better today, sensation/motor function intact in both lower extremities. Puncture site without bleeding, significant bruising or pain.  Pathology pending on bone biopsy.  Further plans per primary team, NIR remains available as needed - please call with questions or concerns.  Electronically Signed: Joaquim Nam, PA-C 07/24/2021, 11:10 AM   I spent a total of 15 Minutes at the the patient's bedside AND on the patient's [facility] floor or unit, greater than 50% of which was counseling/coordinating care for L4 FRA/KP follow up.

## 2021-07-24 NOTE — Progress Notes (Addendum)
[place] for IV heparin Indication: atrial fibrillation  Allergies  Allergen Reactions   Black Walnut Flavor Hives   Shellfish Allergy Itching    Makes throat itch     Patient Measurements: Height: 5\' 3"  (160 cm) Weight: 94.9 kg (209 lb 3.5 oz) IBW/kg (Calculated) : 52.4 Heparin Dosing Weight: 73 kg  Vital Signs: Temp: 98.2 F (36.8 C) (08/30 0802) Temp Source: Oral (08/30 0802) BP: 101/61 (08/30 0802) Pulse Rate: 108 (08/30 0802)  Labs: Recent Labs    07/22/21 0425 07/22/21 1611 07/23/21 0442 07/23/21 0723 07/24/21 0427  HGB 10.6*  --  11.0*  --  9.9*  HCT 34.2*  --  37.3  --  33.1*  PLT 111*  --  115*  --  97*  APTT 49* 63* 95*  --  65*  HEPARINUNFRC >1.10*  --  1.02*  --  0.71*  CREATININE  --   --   --  9.11* 6.50*     Estimated Creatinine Clearance: 8.3 mL/min (A) (by C-G formula based on SCr of 6.5 mg/dL (H)).   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 Pt has hx of atrial fibrillation, for which she was on apixaban PTA (last dose at 1100 on 8/25). Pt was transitioned to heparin infusion for planned vertebroplasty.    She is now s/p bone biopsy + L4 kyphoplasty. Heparin gtt resumed at 1000 units/hr, aPTT slightly subtherapeutic at 65 seconds, Anti-Xa level downtrend but remains falsely elevated  Goal of Therapy:  Heparin level 0.3-0.7 units/ml aPTT 66-102 seconds Monitor platelets by anticoagulation protocol: Yes   Plan:  Increase heparin gtt to 1100 units/hr F/u 8 hour aPTT/HL F/u ability to transition back to Eliquis  Addendum: Switch back to Eliquis 5mg  BID today, d/c heparin gtt at same time Eliquis is given  Bertis Ruddy, PharmD Clinical Pharmacist ED Pharmacist Phone # [phone] 07/24/2021 8:28 AM

## 2021-07-24 NOTE — Progress Notes (Signed)
I called the patient's daughter Ms. Mateo Flow to let her know we'd like to see Melissa Adams in the outpt setting once she's discharged to review options of possible radiation following her augmentation. She is in agreement.    Carola Rhine, PAC

## 2021-07-24 NOTE — Evaluation (Signed)
Occupational Therapy Evaluation Patient Details Name: Melissa Adams MRN: [id number] DOB: 12/07/1945 Today's Date: 07/24/2021    History of Present Illness Pt is a 75 yr old female who presented due back pain and reports of Le going out underneath the pt. Pt s/p RFA and balloon kyphoplasty of L4. 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 PMH but not limited to: afib, ESRD, CHF with EF of 30-35%, cariogenic cirrhosis, hypotension, duodenal neuroendocrine tumor, R peural effusion   Clinical Impression   Pt in session was very lethargic and presented with difficulties keeping eyes open but when sitting at EOB was more engaged. Pt required total for all ADLs at this time and for transfers/bed mobility. Occupational Therapy services will continue to follow but may need palliative services. Pt currently with functional limitations due to the deficits listed below (see OT Problem List).  Pt will benefit from skilled OT to increase their safety and independence with ADL and functional mobility for ADL to facilitate discharge to venue listed below.      Follow Up Recommendations  SNF;Supervision/Assistance - 24 hour    Equipment Recommendations       Recommendations for Other Services       Precautions / Restrictions Precautions Precautions: Fall Precaution Comments: lethargy, tremors Restrictions Weight Bearing Restrictions: No      Mobility Bed Mobility Overal bed mobility: Needs Assistance Bed Mobility: Rolling;Sidelying to Sit;Sit to Supine Rolling: Total assist;+2 for physical assistance Sidelying to sit: +2 for physical assistance;Total assist   Sit to supine: Total assist;+2 for physical assistance   General bed mobility comments: max directional verbal cues, unable ot use hands to assist, unable to initiate, total for trunk and LE management, mod/maxA to maintain EOB, EOB BP dropped to 82/63 from 105/69    Transfers                 General transfer comment:  deferred today due to weakness and lethargy    Balance Overall balance assessment: Needs assistance Sitting-balance support: Feet supported;Bilateral upper extremity supported   Sitting balance - Comments: mod/maxA       Standing balance comment: deferred                           ADL either performed or assessed with clinical judgement   ADL Overall ADL's : Needs assistance/impaired Eating/Feeding: Total assistance;Cueing for safety;Cueing for sequencing;Sitting   Grooming: Total assistance;Bed level   Upper Body Bathing: Total assistance;Bed level   Lower Body Bathing: Total assistance;+2 for physical assistance;+2 for safety/equipment;Bed level   Upper Body Dressing : Total assistance;Bed level   Lower Body Dressing: Total assistance;Bed level   Toilet Transfer: Total assistance;+2 for physical assistance;+2 for safety/equipment   Toileting- Clothing Manipulation and Hygiene: Total assistance;+2 for physical assistance;+2 for safety/equipment;Bed level         General ADL Comments: Pt very limited in movments as giving trance amount of movment in session but increase in particiaption sitting at EOB     Vision         Perception     Praxis      Pertinent Vitals/Pain Pain Assessment: Faces Faces Pain Scale: Hurts little more Pain Location: grimacing with movement, pt reports "i had back surgery yesterday" Pain Descriptors / Indicators: Grimacing Pain Intervention(s): Limited activity within patient's tolerance     Hand Dominance Right   Extremity/Trunk Assessment Upper Extremity Assessment Upper Extremity Assessment: Defer to OT evaluation;RUE deficits/detail;LUE deficits/detail  RUE Deficits / Details: trace movements RUE Coordination: decreased fine motor;decreased gross motor LUE Deficits / Details: trace movements LUE Coordination: decreased fine motor;decreased gross motor   Lower Extremity Assessment Lower Extremity Assessment:  Generalized weakness       Communication Communication Communication: Expressive difficulties   Cognition Arousal/Alertness: Lethargic Behavior During Therapy: Flat affect Overall Cognitive Status: Impaired/Different from baseline Area of Impairment: Orientation;Attention;Following commands;Safety/judgement;Awareness;Problem solving                 Orientation Level: Disoriented to;Time Current Attention Level: Focused   Following Commands: Follows one step commands with increased time;Follows one step commands inconsistently Safety/Judgement: Decreased awareness of safety;Decreased awareness of deficits Awareness: Intellectual Problem Solving: Slow processing;Decreased initiation;Difficulty sequencing;Requires verbal cues;Requires tactile cues General Comments: pt lethargic, but improved with increased stimulation, lights on, sitting EOB however hard to keep pt on task with conversation, pt did follow majority of simple commands, verbal cues to keep eyes open, difficulty initiating tasks   General Comments  BP dropped from 105/69 to 82/63    Exercises     Shoulder Instructions      Home Living Family/patient expects to be discharged to:: Private residence Living Arrangements: Children Available Help at Discharge: Family;Available PRN/intermittently Type of Home: Apartment Home Access: Stairs to enter Entrance Stairs-Number of Steps: 13 Entrance Stairs-Rails: Can reach both Home Layout: One level     Bathroom Shower/Tub: Walk-in shower;Tub/shower unit   Bathroom Toilet: Standard Bathroom Accessibility: No   Home Equipment: Walker - 2 wheels          Prior Functioning/Environment Level of Independence: Needs assistance  Gait / Transfers Assistance Needed: was amb with cane untl 2 weeks with onset of back pain then needed RW ADL's / Homemaking Assistance Needed: was dressing and bathing self until 2 weeks ago            OT Problem List: Decreased  strength;Decreased range of motion;Decreased activity tolerance;Impaired balance (sitting and/or standing);Decreased safety awareness;Pain      OT Treatment/Interventions: Self-care/ADL training;Therapeutic exercise;DME and/or AE instruction;Therapeutic activities;Patient/family education;Balance training    OT Goals(Current goals can be found in the care plan section) Acute Rehab OT Goals Patient Stated Goal: none OT Goal Formulation: With patient Time For Goal Achievement: 08/04/21 Potential to Achieve Goals: Fair ADL Goals Pt Will Perform Eating: with mod assist;with adaptive utensils;bed level Pt Will Perform Grooming: with mod assist;bed level Pt Will Perform Upper Body Dressing: with mod assist;bed level Pt Will Perform Lower Body Dressing: with mod assist;bed level  OT Frequency: Min 2X/week   Barriers to D/C:            Co-evaluation PT/OT/SLP Co-Evaluation/Treatment: Yes Reason for Co-Treatment: Complexity of the patient's impairments (multi-system involvement) PT goals addressed during session: Mobility/safety with mobility OT goals addressed during session: ADL's and self-care      AM-PAC OT "6 Clicks" Daily Activity     Outcome Measure Help from another person eating meals?: Total Help from another person taking care of personal grooming?: Total Help from another person toileting, which includes using toliet, bedpan, or urinal?: Total Help from another person bathing (including washing, rinsing, drying)?: Total Help from another person to put on and taking off regular upper body clothing?: Total Help from another person to put on and taking off regular lower body clothing?: Total 6 Click Score: 6   End of Session Nurse Communication: Mobility status;Other (comment) (feeding assist, BP)  Activity Tolerance: Patient limited by lethargy Patient left: in bed;with call bell/phone  within reach;with nursing/sitter in room;with bed alarm set  OT Visit Diagnosis:  Unsteadiness on feet (R26.81);Other abnormalities of gait and mobility (R26.89);Repeated falls (R29.6);Muscle weakness (generalized) (M62.81);History of falling (Z91.81)                Time: 9735-3299 OT Time Calculation (min): 33 min Charges:  OT General Charges $OT Visit: 1 Visit OT Evaluation $OT Eval Low Complexity: Burns OTR/L  Acute Rehab Services  [phone] office number [phone] pager number   Joeseph Amor 07/24/2021, 11:49 AM

## 2021-07-24 NOTE — Evaluation (Addendum)
Physical Therapy Evaluation Patient Details Name: Melissa Adams MRN: [id number] DOB: 10/23/1946 Today's Date: 07/24/2021   History of Present Illness  Pt is a 75 yr old female who presented due back pain and reports of Le going out underneath the pt. Pt s/p RFA and balloon kyphoplasty of L4. 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 Pt with only 1 kidney and has had multiple pain meds, anesthesia and dialysis which could very well be causing lethargy. In speaking with daughter pt was supervision with cane up until the day before admit to hospital in which they had to call non-emergent ambulance to take pt downstairs to an appointment. Had an extensive conversation with daughter regarding pt's current totalAX2 level of function but aware she hasn't been up in 6 days and had a long day with HD and KP procedure yesterday. Encouraged daughter to speak with MD and palliative care regarding pt's medical prognosis to help guide d/c recommendations regarding mobility. At this time recommend SNF as pt requires total A and is unable to negotiate 13 stairs to enter apartment or able to be home alone as she was PTA. Acute PT to cont to follow and progress mobility as desired by family and patient.    Follow Up Recommendations SNF;Supervision/Assistance - 24 hour    Equipment Recommendations   (TBD at next venue)    Recommendations for Other Services       Precautions / Restrictions Precautions Precautions: Fall Precaution Comments: lethargy, tremors Restrictions Weight Bearing Restrictions: No      Mobility  Bed Mobility Overal bed mobility: Needs Assistance Bed Mobility: Rolling;Sidelying to Sit;Sit to Supine Rolling: Total assist;+2 for physical  assistance Sidelying to sit: +2 for physical assistance;Total assist   Sit to supine: Total assist;+2 for physical assistance   General bed mobility comments: max directional verbal cues, unable ot use hands to assist, unable to initiate, total for trunk and LE management, mod/maxA to maintain EOB, EOB BP dropped to 82/63 from 105/69    Transfers                 General transfer comment: deferred today due to weakness and lethargy  Ambulation/Gait             General Gait Details: unable  Stairs            Wheelchair Mobility    Modified Rankin (Stroke Patients Only)       Balance Overall balance assessment: Needs assistance Sitting-balance support: Feet supported;Bilateral upper extremity supported (limited active bilat UE support, rested hands on EOB without sliding off EOB however trace tricep mvmt noted)   Sitting balance - Comments: mod/maxA       Standing balance comment: deferred                             Pertinent Vitals/Pain Pain Assessment: Faces Faces Pain Scale: Hurts little more Pain Location: grimacing with movement, pt reports "i had back surgery yesterday" Pain Descriptors / Indicators: Grimacing Pain Intervention(s): Monitored during session    Home Living Family/patient expects to be discharged to:: Private residence Living Arrangements: Children (dtr and son-in-law) Available Help at Discharge: Family;Available PRN/intermittently (but is interested in [facility] aide) Type of Home:  Apartment Home Access: Stairs to enter Entrance Stairs-Rails: Can reach both (6 step into apt the HR is on R side) Entrance Stairs-Number of Steps: 13 Home Layout: One level Home Equipment: Walker - 2 wheels      Prior Function Level of Independence: Needs assistance   Gait / Transfers Assistance Needed: was amb with cane untl 2 weeks with onset of back pain then needed RW  ADL's / Homemaking Assistance Needed: was dressing and bathing  self until 2 weeks ago        Hand Dominance   Dominant Hand: Right    Extremity/Trunk Assessment   Upper Extremity Assessment Upper Extremity Assessment: Defer to OT evaluation (minimal initiation, difficult grip)    Lower Extremity Assessment Lower Extremity Assessment: Generalized weakness (grossly 3-/5, did better EOB)       Communication   Communication: Expressive difficulties (delayed response time, some repetition/perseverated, improved with eval)  Cognition Arousal/Alertness: Lethargic Behavior During Therapy: Flat affect Overall Cognitive Status: Impaired/Different from baseline Area of Impairment: Orientation;Attention;Following commands;Safety/judgement;Awareness;Problem solving                 Orientation Level: Disoriented to;Time (stated name, birthday, [place] and back surgery) Current Attention Level: Focused   Following Commands: Follows one step commands with increased time;Follows one step commands inconsistently Safety/Judgement: Decreased awareness of safety;Decreased awareness of deficits Awareness: Intellectual Problem Solving: Slow processing;Decreased initiation;Difficulty sequencing;Requires verbal cues;Requires tactile cues General Comments: pt lethargic, but improved with increased stimulation, lights on, sitting EOB however hard to keep pt on task with conversation, pt did follow majority of simple commands, verbal cues to keep eyes open, difficulty initiating tasks      General Comments General comments (skin integrity, edema, etc.): BP dropped from 105/69 to 82/63    Exercises     Assessment/Plan    PT Assessment Patient needs continued PT services  PT Problem List Decreased strength;Decreased range of motion;Decreased activity tolerance;Decreased balance;Decreased mobility;Decreased coordination;Decreased cognition;Decreased knowledge of use of DME;Decreased safety awareness;Decreased knowledge of precautions       PT  Treatment Interventions DME instruction;Gait training;Stair training;Functional mobility training;Balance training;Therapeutic activities;Therapeutic exercise;Neuromuscular re-education    PT Goals (Current goals can be found in the Care Plan section)  Acute Rehab PT Goals Patient Stated Goal: didn't state PT Goal Formulation: With patient/family Time For Goal Achievement: 08/07/21 Potential to Achieve Goals: Good    Frequency Min 3X/week   Barriers to discharge Inaccessible home environment 13 steps to enter home, PRN family assist    Co-evaluation PT/OT/SLP Co-Evaluation/Treatment: Yes Reason for Co-Treatment: Complexity of the patient's impairments (multi-system involvement) PT goals addressed during session: Mobility/safety with mobility         AM-PAC PT "6 Clicks" Mobility  Outcome Measure Help needed turning from your back to your side while in a flat bed without using bedrails?: A Lot Help needed moving from lying on your back to sitting on the side of a flat bed without using bedrails?: A Lot Help needed moving to and from a bed to a chair (including a wheelchair)?: A Lot Help needed standing up from a chair using your arms (e.g., wheelchair or bedside chair)?: A Lot Help needed to walk in hospital room?: Total Help needed climbing 3-5 steps with a railing? : Total 6 Click Score: 10    End of Session Equipment Utilized During Treatment: Oxygen (3L02 via [place]) Activity Tolerance: Patient tolerated treatment well Patient left: in bed;with call bell/phone within reach;with bed alarm set;with nursing/sitter in room Nurse  Communication: Mobility status (drop in the BP) PT Visit Diagnosis: Unsteadiness on feet (R26.81);Difficulty in walking, not elsewhere classified (R26.2)    Time: 2951-8841 PT Time Calculation (min) (ACUTE ONLY): 33 min   Charges:   PT Evaluation $PT Eval Moderate Complexity: 1 Mod          Kittie Plater, PT, DPT Acute Rehabilitation  Services Pager #: [phone] Office #: [phone]   Berline Lopes 07/24/2021, 9:02 AM

## 2021-07-24 NOTE — Progress Notes (Signed)
PROGRESS NOTE  Melissa Adams  DOB: September 03, 1946  PCP: [name], NP SVX:[id number]  DOA: 07/17/2021  LOS: 5 days  Hospital Day: 8   Chief complaint: Back pain  Brief narrative: Melissa Adams is a 75 y.o. 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 Patient follows up with oncologist Dr. Lorenso Courier.  She had an MRI of the abdomen under anesthesia done on 06/19/2021 which showed innumerable enhancing lesions throughout both lobes of liver concerning for metastatic disease. On 06/28/2021, she was seen by Dr. Lorenso Courier in the office and had a CT abdomen and pelvis done on 8/12.  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  She also had a small to moderate right pleural effusion for which she sees pulmonologist Dr. Erin Fulling.  She had a thoracentesis in May which did not show malignant cells and cytology. On 8/15, she underwent a liver biopsy that showed adenocarcinoma of unknown primary. On 8/23, patient went to Dr. August Albino office for follow-up.  She remains in excruciating pain in her lower back over the past few weeks because of which she has not been able to walk on her own. Because of the severity of her pain, she was sent to the ED for inpatient management. Chest x-ray showed right middle lobe consolidation with moderate right pleural effusion. Patient was admitted to hospitalist service for further evaluation management  Subjective: Patient was seen and examined this morning.   Depressed.  Did not engage in a conversation.  Understands the poor prognosis of her situation. Seen by PT this morning.  SNF recommended.   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  Also has multilevel moderate to severe neural foraminal narrowing in the lumbar region.  -Discussed with oncologist Dr. Lorenso Courier, as well as IR.  Patient is a poor candidate for open surgical intervention by neurosurgery.  8/30, patient underwent vertebroplasty by IR. -At this time, continue pain control with IV Dilaudid and oral oxycodone as needed. -PT eval obtained.  SNF recommended. -Palliative care consult appreciated. Had a family meeting on 8/27.  Patient and family wanted to continue treatment measures at this time keeping her DNR/DNI.  Metastatic adenocarcinoma of unknown primary -Shown on liver biopsy from 8/15. -Unable to go for previously planned outpatient breast biopsy because patient is hospitalized.   -8/30, IR did a vertebral biopsy  Right pleural effusion -8/26, underwent thoracentesis with removal of 1.2 L of pleural fluid.  Respiratory status stable.  ESRD-HD-MWF -Nephrology consulted -Continue midodrine  Chronic systolic CHF Cardiogenic liver cirrhosis -Last EF 30 to 35%.  Patient was on Toprol-XL as a result high dose of Lasix.  With poor oral intake and intermittent hypotension, Lasix was kept on hold.  Toprol to continue at a lower dose.  A. fib  -Continue Toprol at a lower dose.  Eliquis was held for vertebroplasty yesterday.  Will resume today.  Mobility: PT eval after pain control Code Status:   Code Status: DNR  Nutritional  status: Body mass index is 37.06 kg/m.     Diet:  Diet Order             Diet renal with fluid restriction Fluid restriction: 1200 mL Fluid; Room service appropriate? Yes; Fluid consistency: Thin  Diet  effective now                  DVT prophylaxis:     Antimicrobials: None Fluid: None Consultants: Oncology, IR Family Communication: Called and updated patient's daughter Ms. Patty this afternoon.  Status is: Inpatient  Remains inpatient appropriate because: Pending SNF  Dispo: The patient is from: Home              Anticipated d/c is to:  Pending SNF              Patient currently is not medically stable to d/c.   Difficult to place patient No     Infusions:   heparin 1,100 Units/hr (07/24/21 1044)    Scheduled Meds:  (feeding supplement) PROSource Plus  30 mL Oral BID BM   allopurinol  300 mg Oral QHS   atorvastatin  80 mg Oral QPM   Chlorhexidine Gluconate Cloth  6 each Topical Q0600   cinacalcet  30 mg Oral Q supper   doxercalciferol  2 mcg Intravenous Q M,W,F-HD   ferric citrate  420 mg Oral TID WC   ferrous sulfate  325 mg Oral Daily   gabapentin  100 mg Oral TID   metoprolol tartrate  12.5 mg Oral BID   midodrine  10 mg Oral Daily   multivitamin  1 tablet Oral QHS   senna-docusate  1 tablet Oral BID   vitamin B-12  1,000 mcg Oral Daily    Antimicrobials: Anti-infectives (From admission, onward)    Start     Dose/Rate Route Frequency Ordered Stop   07/23/21 0000  ceFAZolin (ANCEF) IVPB 2g/100 mL premix        2 g 200 mL/hr over 30 Minutes Intravenous On call 07/22/21 1422 07/23/21 1410       PRN meds: acetaminophen **OR** acetaminophen, albuterol, iohexol, LORazepam, ondansetron **OR** ondansetron (ZOFRAN) IV, oxyCODONE   Objective: Vitals:   07/24/21 1048 07/24/21 1117  BP: 109/66 (!) 119/49  Pulse:  89  Resp:  18  Temp:  98.6 F (37 C)  SpO2:  99%    Intake/Output Summary (Last 24 hours) at 07/24/2021 1508 Last data filed at 07/24/2021 0840 Gross per 24 hour  Intake 425 ml  Output --  Net 425 ml   Filed Weights   07/19/21 1545 07/20/21 0840 07/23/21 0900  Weight: 93.4 kg 93.4 kg 94.9 kg   Weight change:  Body mass index is  37.06 kg/m.   Physical Exam: General exam: Pleasant, elderly African-American female.  Pain controlled Skin: No rashes, lesions or ulcers. HEENT: Atraumatic, normocephalic, no obvious bleeding Lungs: Clear to auscultation bilaterally CVS: Regular rate and rhythm, no murmur GI/Abd soft, nontender, nondistended, bowel sound present CNS: Opens eyes to verbal command.  Tearful.  Able to answer orientation questions  psychiatry: Depressed look Extremities: No pedal edema, no calf tenderness  Data Review: I have personally reviewed the laboratory data and studies available.  Recent Labs  Lab 07/17/21 1847 07/20/21 0802 07/20/21 0914 07/21/21 0325 07/22/21 0425 07/23/21 0442 07/24/21 0427  WBC 8.7 8.2  --  6.7 8.0 8.2 7.1  NEUTROABS 6.9  --   --   --   --   --   --  HGB 11.3* 10.3* 10.2* 10.6* 10.6* 11.0* 9.9*  HCT 36.9 33.4* 30.0* 34.1* 34.2* 37.3 33.1*  MCV 99.2 99.1  --  100.6* 100.6* 101.1* 100.3*  PLT 172 91*  --  113* 111* 115* 97*   Recent Labs  Lab 07/17/21 1847 07/20/21 0914 07/20/21 1700 07/23/21 0723 07/24/21 0427  NA 133* 140  --  130* 130*  K 3.8 3.8  --  5.1 4.1  CL 95* 100  --  95* 94*  CO2 23  --   --  24 25  GLUCOSE 86 86  --  103* 89  BUN 29* 31*  --  67* 39*  CREATININE 5.81* 4.80*  --  9.11* 6.50*  CALCIUM 9.6  --   --  8.1* 7.6*  PHOS  --   --  4.8* 5.8*  --     F/u labs ordered Unresulted Labs (From admission, onward)     Start     Ordered   07/25/21 0500  APTT  Daily,   R     Question:  Specimen collection method  Answer:  Lab=Lab collect   07/23/21 1519   07/25/21 0500  Heparin level (unfractionated)  Daily,   R     Question:  Specimen collection method  Answer:  Lab=Lab collect   07/23/21 1519   07/24/21 1700  APTT  Once-Timed,   TIMED       Question:  Specimen collection method  Answer:  Lab=Lab collect  See Hyperspace for full Linked Orders Report.   07/24/21 0830   07/24/21 1700  Heparin level (unfractionated)  Once-Timed,   TIMED        Question:  Specimen collection method  Answer:  Lab=Lab collect  See Hyperspace for full Linked Orders Report.   07/24/21 0830   07/24/21 6384  Basic metabolic panel  Daily,   R     Question:  Specimen collection method  Answer:  Lab=Lab collect   07/23/21 1042   07/21/21 0500  CBC  Daily,   R     Question:  Specimen collection method  Answer:  Lab=Lab collect   07/20/21 1840   Signed and Held  Renal function panel  Once,   R       Question:  Specimen collection method  Answer:  Lab=Lab collect   Signed and Held   Signed and Held  CBC  Once,   R       Question:  Specimen collection method  Answer:  Lab=Lab collect   Signed and Held            Signed, Terrilee Croak, MD [facility] 07/24/2021

## 2021-07-24 NOTE — Progress Notes (Signed)
Paged Dr. Olena Heckle about bp; 99/43 (60).  Will continue to monitor patient, no new orders at this time.

## 2021-07-24 NOTE — Progress Notes (Signed)
   07/24/21 0048  Assess: MEWS Score  Temp 97.9 F (36.6 C)  BP (!) 80/36  Pulse Rate 95  Resp 18  SpO2 95 %  O2 Device Nasal Cannula  O2 Flow Rate (L/min) 2 L/min  Assess: MEWS Score  MEWS Temp 0  MEWS Systolic 2  MEWS Pulse 0  MEWS RR 0  MEWS LOC 0  MEWS Score 2  MEWS Score Color Yellow  Assess: if the MEWS score is Yellow or Red  Were vital signs taken at a resting state? Yes  Focused Assessment No change from prior assessment  Early Detection of Sepsis Score *See Row Information* Low  MEWS guidelines implemented *See Row Information* Yes  Treat  MEWS Interventions Administered prn meds/treatments;Escalated (See documentation below)  Pain Score Asleep  Take Vital Signs  Increase Vital Sign Frequency  Yellow: Q 2hr X 2 then Q 4hr X 2, if remains yellow, continue Q 4hrs  Escalate  MEWS: Escalate Yellow: discuss with charge nurse/RN and consider discussing with provider and RRT  Notify: Charge Nurse/RN  Name of Charge Nurse/RN Notified Cheron Schaumann, RN  Date Charge Nurse/RN Notified 07/24/21  Time Charge Nurse/RN Notified 0052  Notify: Provider  Provider Name/Title J. Olena Heckle  Date Provider Notified 07/24/21  Time Provider Notified [phone]  Notification Reason Change in status  Provider response See new orders  Date of Provider Response 07/24/21  Time of Provider Response [phone]  Document  Patient Outcome Stabilized after interventions  Progress note created (see row info) Yes

## 2021-07-24 NOTE — Progress Notes (Signed)
This chaplain is present with the Pt. Family is not at the bedside.  The chaplain was updated by the Pt. RN-Naomi before the visit. Through clarifying questions the chaplain determined the Pt. desires to notarize her Advance Directive: HCPOA and Living Will.  The Pt. agreed to the chaplain requesting a notary visit today between 1-3pm.  The chaplain will update the RN when a visit time is determined.  This chaplain is available for F/U spiritual care as needed.  Chaplain Sallyanne Kuster Pager [phone]

## 2021-07-24 NOTE — Progress Notes (Signed)
[facility] ROUNDING NOTE   Subjective:   Interval History: This is a 75 year old lady end-stage renal disease Monday Wednesday Friday dialysis.  Last dialysis treatment was 07/19/2021.  Next dialysis treatment will be 07/23/2021.  She was admitted with intractable back pain pathological fracture L4 due to metastatic carcinoma.  She has metastatic disease to the liver and spine with questionable breast primary source.  She is currently being followed by palliative medicine.  She has a history of cardiomyopathy with ejection fraction 30 to 35% and atrial fibrillation which is rate controlled.  She is not requiring any ESA due to the history of cancer   Blood pressure 105/69 pulse 100 temperature 98.5 O2 sats 98% 2 L nasal cannula.  Last dialysis 07/23/2021 with 1.8 L removed   Sodium 130 potassium 4.1 chloride 94 CO2 25 BUN 39 creatinine 6.5 glucose 89 phosphorus 5.8 albumin 3 calcium 7.6 hemoglobin 9.9      Dialysis Orders: adm [facility],MWF, 4hr Edw 88.5,2k, 3ca bath uf2 Hec 2 No Hep Venofer 50  No ESA 2/2 CA    Objective:  Vital signs in last 24 hours:  Temp:  [97.7 F (36.5 C)-99.2 F (37.3 C)] 98.5 F (36.9 C) (08/30 0418) Pulse Rate:  [57-117] 100 (08/30 0418) Resp:  [9-21] 20 (08/30 0418) BP: (80-130)/(36-98) 89/73 (08/30 0418) SpO2:  [90 %-100 %] 99 % (08/30 0418) Weight:  [94.9 kg] 94.9 kg (08/29 0900)  Weight change:  Filed Weights   07/19/21 1545 07/20/21 0840 07/23/21 0900  Weight: 93.4 kg 93.4 kg 94.9 kg    Intake/Output: I/O last 3 completed shifts: In: 185 [I.V.:85; IV Piggyback:100] Out: 1804 [FWYOV:[id number]]   Intake/Output this shift:  No intake/output data recorded.  CVS- RRR RS- CTAGeneral:well appearing female in NAD Heart:RRR, no mrg Lungs: breath sounds decreased, nml WOB on 2L via [place] Abdomen:soft, NTND Extremities:trace LE edema Dialysis Access: LU AVF +b/t  ABD- BS present soft non-distended EXT- no edema   Basic Metabolic  Panel: Recent Labs  Lab 07/17/21 1847 07/20/21 0914 07/20/21 1700 07/23/21 0723 07/24/21 0427  NA 133* 140  --  130* 130*  K 3.8 3.8  --  5.1 4.1  CL 95* 100  --  95* 94*  CO2 23  --   --  24 25  GLUCOSE 86 86  --  103* 89  BUN 29* 31*  --  67* 39*  CREATININE 5.81* 4.80*  --  9.11* 6.50*  CALCIUM 9.6  --   --  8.1* 7.6*  PHOS  --   --  4.8* 5.8*  --      Liver Function Tests: Recent Labs  Lab 07/17/21 1847 07/23/21 0723  AST 58*  --   ALT 37  --   ALKPHOS 238*  --   BILITOT 1.5*  --   PROT 6.7  --   ALBUMIN 3.6 3.0*    No results for input(s): LIPASE, AMYLASE in the last 168 hours. No results for input(s): AMMONIA in the last 168 hours.  CBC: Recent Labs  Lab 07/17/21 1847 07/20/21 0802 07/20/21 0914 07/21/21 0325 07/22/21 0425 07/23/21 0442 07/24/21 0427  WBC 8.7 8.2  --  6.7 8.0 8.2 7.1  NEUTROABS 6.9  --   --   --   --   --   --   HGB 11.3* 10.3* 10.2* 10.6* 10.6* 11.0* 9.9*  HCT 36.9 33.4* 30.0* 34.1* 34.2* 37.3 33.1*  MCV 99.2 99.1  --  100.6* 100.6* 101.1* 100.3*  PLT  172 91*  --  113* 111* 115* 97*     Cardiac Enzymes: No results for input(s): CKTOTAL, CKMB, CKMBINDEX, TROPONINI in the last 168 hours.  BNP: Invalid input(s): POCBNP  CBG: Recent Labs  Lab 07/19/21 2211  GLUCAP 166*     Microbiology: Results for orders placed or performed during the hospital encounter of 07/17/21  Resp Panel by RT-PCR (Flu A&B, Covid) Nasopharyngeal Swab     Status: None   Collection Time: 07/17/21  7:12 PM   Specimen: Nasopharyngeal Swab; Nasopharyngeal(NP) swabs in vial transport medium  Result Value Ref Range Status   SARS Coronavirus 2 by RT PCR NEGATIVE NEGATIVE Final    Comment: (NOTE) SARS-CoV-2 target nucleic acids are NOT DETECTED.  The SARS-CoV-2 RNA is generally detectable in upper respiratory specimens during the acute phase of infection. The lowest concentration of SARS-CoV-2 viral copies this assay can detect is 138 copies/mL. A  negative result does not preclude SARS-Cov-2 infection and should not be used as the sole basis for treatment or other patient management decisions. A negative result may occur with  improper specimen collection/handling, submission of specimen other than nasopharyngeal swab, presence of viral mutation(s) within the areas targeted by this assay, and inadequate number of viral copies(<138 copies/mL). A negative result must be combined with clinical observations, patient history, and epidemiological information. The expected result is Negative.  Fact Sheet for Patients:  EntrepreneurPulse.com.au  Fact Sheet for Healthcare Providers:  IncredibleEmployment.be  This test is no t yet approved or cleared by the [place] FDA and  has been authorized for detection and/or diagnosis of SARS-CoV-2 by FDA under an Emergency Use Authorization (EUA). This EUA will remain  in effect (meaning this test can be used) for the duration of the COVID-19 declaration under Section 564(b)(1) of the Act, 21 U.S.C.section 360bbb-3(b)(1), unless the authorization is terminated  or revoked sooner.       Influenza A by PCR NEGATIVE NEGATIVE Final   Influenza B by PCR NEGATIVE NEGATIVE Final    Comment: (NOTE) The Xpert Xpress SARS-CoV-2/FLU/RSV plus assay is intended as an aid in the diagnosis of influenza from Nasopharyngeal swab specimens and should not be used as a sole basis for treatment. Nasal washings and aspirates are unacceptable for Xpert Xpress SARS-CoV-2/FLU/RSV testing.  Fact Sheet for Patients: EntrepreneurPulse.com.au  Fact Sheet for Healthcare Providers: IncredibleEmployment.be  This test is not yet approved or cleared by the [place] FDA and has been authorized for detection and/or diagnosis of SARS-CoV-2 by FDA under an Emergency Use Authorization (EUA). This EUA will remain in effect (meaning this test can  be used) for the duration of the COVID-19 declaration under Section 564(b)(1) of the Act, 21 U.S.C. section 360bbb-3(b)(1), unless the authorization is terminated or revoked.  Performed at [facility], [place] [address], [place], [place] [address]   MRSA Next Gen by PCR, Nasal     Status: None   Collection Time: 07/18/21 11:23 PM   Specimen: Nasal Mucosa; Nasal Swab  Result Value Ref Range Status   MRSA by PCR Next Gen NOT DETECTED NOT DETECTED Final    Comment: (NOTE) The GeneXpert MRSA Assay (FDA approved for NASAL specimens only), is one component of a comprehensive MRSA colonization surveillance program. It is not intended to diagnose MRSA infection nor to guide or monitor treatment for MRSA infections. Test performance is not FDA approved in patients less than 32 years old. Performed at [facility], [place] [address], [place], [place]  [address]     Coagulation Studies: No results for input(s): LABPROT, INR in the last 72 hours.  Urinalysis: No results for input(s): COLORURINE, LABSPEC, PHURINE, GLUCOSEU, HGBUR, BILIRUBINUR, KETONESUR, PROTEINUR, UROBILINOGEN, NITRITE, LEUKOCYTESUR in the last 72 hours.  Invalid input(s): APPERANCEUR    Imaging: No results found.   Medications:    heparin 1,000 Units/hr (07/23/21 2108)    (feeding supplement) PROSource Plus  30 mL Oral BID BM   allopurinol  300 mg Oral QHS   atorvastatin  80 mg Oral QPM   cinacalcet  30 mg Oral Q supper   doxercalciferol  2 mcg Intravenous Q M,W,F-HD   ferric citrate  420 mg Oral TID WC   ferrous sulfate  325 mg Oral Daily   gabapentin  100 mg Oral TID   metoprolol tartrate  12.5 mg Oral BID   midodrine  10 mg Oral Daily   multivitamin  1 tablet Oral QHS   senna-docusate  1 tablet Oral BID   vitamin B-12  1,000 mcg Oral Daily   acetaminophen **OR** acetaminophen, albuterol, HYDROmorphone (DILAUDID) injection, iohexol, LORazepam, ondansetron **OR** ondansetron (ZOFRAN)  IV  Assessment/ Plan:  Intractable Back Pain with pathologic fracture of L4 due to metastatic carcinoma.  MRI spine with severe degenerative changes of lumbar spine, lesion likely representing metastatic disease, +L4-L5 compression fracture. pain meds/management per admit/oncology ESRD -  HD MWF, off schedule last week due to staffing issues and increased patient census.  Last dialysis 07/23/2021 Next dialysis treatment 07/25/2021 R pleural effusion - s/p IR paracentesis 8/26 with 1.2L yield clear amber fluids. CXR with improvement in pleural effusion.  Hypertension/volume - BP well controlled with lopressor 12.5mg  BID and midodrine 10mg  daily.  Does not appear grossly volume overloaded. UF as tolerated.  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 Lorenso Courier oncology noted notes. Palliative care consulted, patient now DNAR/DNI.  Family meeting yesterday.  Now DNI/DNAR.  Appreciate palliative assistance.  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  Continue Hectorol 72mcg, Sensipar, auryxia Cardiomyopathy w/EF 30-35% Atrial fib on lopressor and Eliquis.     LOS: [place] @TODAY @7 :41 AM

## 2021-07-24 NOTE — Care Management Important Message (Signed)
Important Message  Patient Details  Name: Melissa Adams MRN: [id number] Date of Birth: 30-Jun-1946   Medicare Important Message Given:  Yes     Orbie Pyo 07/24/2021, 4:34 PM

## 2021-07-25 DIAGNOSIS — M4856XA Collapsed vertebra, not elsewhere classified, lumbar region, initial encounter for fracture: Secondary | ICD-10-CM | POA: Diagnosis not present

## 2021-07-25 LAB — CBC
HCT: 30.9 % — ABNORMAL LOW (ref 36.0–46.0)
Hemoglobin: 9.6 g/dL — ABNORMAL LOW (ref 12.0–15.0)
MCH: 30.5 pg (ref 26.0–34.0)
MCHC: 31.1 g/dL (ref 30.0–36.0)
MCV: 98.1 fL (ref 80.0–100.0)
Platelets: 118 10*3/uL — ABNORMAL LOW (ref 150–400)
RBC: 3.15 MIL/uL — ABNORMAL LOW (ref 3.87–5.11)
RDW: 17.3 % — ABNORMAL HIGH (ref 11.5–15.5)
WBC: 6.7 10*3/uL (ref 4.0–10.5)
nRBC: 0 % (ref 0.0–0.2)

## 2021-07-25 LAB — BASIC METABOLIC PANEL
Anion gap: 11 (ref 5–15)
BUN: 56 mg/dL — ABNORMAL HIGH (ref 8–23)
CO2: 26 mmol/L (ref 22–32)
Calcium: 7.9 mg/dL — ABNORMAL LOW (ref 8.9–10.3)
Chloride: 91 mmol/L — ABNORMAL LOW (ref 98–111)
Creatinine, Ser: 7.5 mg/dL — ABNORMAL HIGH (ref 0.44–1.00)
GFR, Estimated: 5 mL/min — ABNORMAL LOW (ref >60)
Glucose, Bld: 93 mg/dL (ref 70–99)
Potassium: 4.4 mmol/L (ref 3.5–5.1)
Sodium: 128 mmol/L — ABNORMAL LOW (ref 135–145)

## 2021-07-25 NOTE — Progress Notes (Signed)
This chaplain attempted to facilitate notarizing of the Pt. Advance Directive.    The notary arrived and the Pt. declined signing the AD without the Pt. daughter-[name]'s presence.  The chaplain updated the Pt. RN-Donavan of the change. The chaplain informed the Pt. and RN of the notary's hours of service (1-3:30, M-Th). The Pt. AD is filled out and in her chart.  This chaplain is available for F/U spiritual care as needed.

## 2021-07-25 NOTE — Progress Notes (Signed)
PROGRESS NOTE  Melissa Adams  DOB: February 01, 1946  PCP: [name], NP DGL:[id number]  DOA: 07/17/2021  LOS: 6 days  Hospital Day: 9   Chief complaint: Back pain  Brief narrative: Melissa Adams is a 75 y.o. 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 Patient follows up with oncologist Dr. Lorenso Courier.  She had an MRI of the abdomen under anesthesia done on 06/19/2021 which showed innumerable enhancing lesions throughout both lobes of liver concerning for metastatic disease. On 06/28/2021, she was seen by Dr. Lorenso Courier in the office and had a CT abdomen and pelvis done on 8/12.  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  She also had a small to moderate right pleural effusion for which she sees pulmonologist Dr. Erin Fulling.  She had a thoracentesis in May which did not show malignant cells and cytology. On 8/15, she underwent a liver biopsy that showed adenocarcinoma of unknown primary. On 8/23, patient went to Dr. August Albino office for follow-up.  She remains in excruciating pain in her lower back over the past few weeks because of which she has not been able to walk on her own. Because of the severity of her pain, she was sent to the ED for inpatient management. Chest x-ray showed right middle lobe consolidation with moderate right pleural effusion. Patient was admitted to hospitalist service for further evaluation management  Subjective: Patient was seen and examined this afternoon.  Propped up in bed.  Taking her lunch. Depressed look.  Not much conversational.  Asked about rehab.  Chaplain at bedside.   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  Also has multilevel moderate to severe neural foraminal narrowing in the lumbar region.  -Oncology, IR and neurosurgery consulted.  Patient is a poor candidate for neurosurgical intervention. 8/30, patient underwent vertebroplasty by IR. -At this time, continue pain control with as needed oral oxycodone as needed. -PT eval obtained.  SNF recommended. -Palliative care consult appreciated. Had a family meeting on 8/27.  Patient and family wanted to continue treatment measures at this time keeping her DNR/DNI.  Metastatic adenocarcinoma of unknown primary -Shown on liver biopsy from 8/15. -Unable to go for previously planned outpatient breast biopsy because patient is hospitalized.   -8/30, IR did a vertebral biopsy, pending report  Right pleural effusion -8/26, underwent thoracentesis with removal of 1.2 L of pleural fluid.  Respiratory status stable.  ESRD-HD-MWF -Nephrology consulted -Continue midodrine  Chronic systolic CHF Cardiogenic liver cirrhosis -Last EF 30 to 35%.  Patient was on Toprol-XL as a result high dose of Lasix.  With poor oral intake and intermittent hypotension, Lasix was kept on hold.  Toprol to continue at a lower dose.  A. fib  -Continue Toprol at a lower dose.  Eliquis was held for vertebroplasty.  Resumed postprocedure  Mobility: PT eval after pain control Code Status:   Code Status: DNR  Nutritional status: Body mass index is 35.73 kg/m.     Diet:  Diet Order             Diet renal with fluid restriction Fluid restriction: 1200 mL Fluid; Room service appropriate? Yes; Fluid consistency: Thin  Diet effective now                  DVT  prophylaxis:   apixaban (ELIQUIS) tablet 5 mg   Antimicrobials: None Fluid: None Consultants: Oncology, IR Family Communication: Called and updated patient's daughter Ms. Patty on 8/30  Status is: Inpatient  Remains inpatient appropriate because: Pending SNF  Dispo: The patient is from: Home              Anticipated d/c is to:  Pending SNF              Patient currently is medically stable to d/c.   Difficult to place patient No     Infusions:     Scheduled Meds:  (feeding supplement) PROSource Plus  30 mL Oral BID BM   allopurinol  300 mg Oral QHS   apixaban  5 mg Oral BID   atorvastatin  80 mg Oral QPM   Chlorhexidine Gluconate Cloth  6 each Topical Q0600   cinacalcet  30 mg Oral Q supper   doxercalciferol  2 mcg Intravenous Q M,W,F-HD   ferric citrate  420 mg Oral TID WC   ferrous sulfate  325 mg Oral Daily   gabapentin  100 mg Oral TID   metoprolol tartrate  12.5 mg Oral BID   midodrine  10 mg Oral Daily   multivitamin  1 tablet Oral QHS   senna-docusate  1 tablet Oral BID   vitamin B-12  1,000 mcg Oral Daily    Antimicrobials: Anti-infectives (From admission, onward)    Start     Dose/Rate Route Frequency Ordered Stop   07/23/21 0000  ceFAZolin (ANCEF) IVPB 2g/100 mL premix        2 g 200 mL/hr over 30 Minutes Intravenous On call 07/22/21 1422 07/23/21 1410       PRN meds: acetaminophen **OR** acetaminophen, albuterol, iohexol, LORazepam, ondansetron **OR** ondansetron (ZOFRAN) IV, oxyCODONE   Objective: Vitals:   07/25/21 1141 07/25/21 1229  BP: (!) 125/48 97/69  Pulse: 98 (!) 107  Resp: 18 19  Temp: 98.5 F (36.9 C) 98.4 F (36.9 C)  SpO2: 100% 95%    Intake/Output Summary (Last 24 hours) at 07/25/2021 1518 Last data filed at 07/25/2021 1128 Gross per 24 hour  Intake 105.82 ml  Output 1500 ml  Net -1394.18 ml    Filed Weights   07/23/21 0900 07/25/21 0748 07/25/21 1128  Weight: 94.9 kg 93.2 kg 91.5 kg   Weight change:  Body mass  index is 35.73 kg/m.   Physical Exam: General exam: Pleasant, elderly African-American female.  Pain controlled Skin: No rashes, lesions or ulcers. HEENT: Atraumatic, normocephalic, no obvious bleeding Lungs: Clear to auscultation bilaterally CVS: Regular rate and rhythm, no murmur GI/Abd soft, nontender, nondistended, bowel sound present CNS: Alert, awake, oriented x3. psychiatry: Depressed look Extremities: No pedal edema, no calf tenderness  Data Review: I have personally reviewed the laboratory data and studies available.  Recent Labs  Lab 07/21/21 0325 07/22/21 0425 07/23/21 0442 07/24/21 0427 07/25/21 0244  WBC 6.7 8.0 8.2 7.1 6.7  HGB 10.6* 10.6* 11.0* 9.9* 9.6*  HCT 34.1* 34.2* 37.3 33.1* 30.9*  MCV 100.6* 100.6* 101.1* 100.3* 98.1  PLT 113* 111* 115* 97* 118*    Recent Labs  Lab 07/20/21 0914 07/20/21 1700  07/23/21 0723 07/24/21 0427 07/25/21 0244  NA 140  --  130* 130* 128*  K 3.8  --  5.1 4.1 4.4  CL 100  --  95* 94* 91*  CO2  --   --  24 25 26   GLUCOSE 86  --  103* 89 93  BUN 31*  --  67* 39* 56*  CREATININE 4.80*  --  9.11* 6.50* 7.50*  CALCIUM  --   --  8.1* 7.6* 7.9*  PHOS  --  4.8* 5.8*  --   --      F/u labs ordered Unresulted Labs (From admission, onward)     Start     Ordered   07/24/21 0240  Basic metabolic panel  Daily,   R     Question:  Specimen collection method  Answer:  Lab=Lab collect   07/23/21 1042   07/21/21 0500  CBC  Daily,   R     Question:  Specimen collection method  Answer:  Lab=Lab collect   07/20/21 1840   Signed and Held  Renal function panel  Once,   R       Question:  Specimen collection method  Answer:  Lab=Lab collect   Signed and Held   Signed and Held  CBC  Once,   R       Question:  Specimen collection method  Answer:  Lab=Lab collect   Signed and Held            Signed, Terrilee Croak, MD [facility] 07/25/2021

## 2021-07-25 NOTE — Progress Notes (Signed)
PT Cancellation Note  Patient Details Name: Melissa Adams MRN: [id number] DOB: 12-Jan-1946   Cancelled Treatment:    Reason Eval/Treat Not Completed: Patient at procedure or test/unavailable. Pt at HD. PT to return as able to progress mobility.  Kittie Plater, PT, DPT Acute Rehabilitation Services Pager #: [phone] Office #: [phone]    Berline Lopes 07/25/2021, 12:20 PM

## 2021-07-25 NOTE — Progress Notes (Signed)
[facility] ROUNDING NOTE   Subjective:   Interval History: This is a 75 year old lady end-stage renal disease Monday Wednesday Friday dialysis.  Last dialysis treatment was 07/19/2021.  Next dialysis treatment will be 07/23/2021.  She was admitted with intractable back pain pathological fracture L4 due to metastatic carcinoma.  She has metastatic disease to the liver and spine with questionable breast primary source.  She is currently being followed by palliative medicine.  She has a history of cardiomyopathy with ejection fraction 30 to 35% and atrial fibrillation which is rate controlled.  She is not requiring any ESA due to the history of cancer   Blood pressure 116/59 pulse 83 temperature 98.3 O2 sats 90%.  Patient receiving dialysis 07/25/2021   Sodium 128 potassium 4.4 chloride 91 CO2 26 BUN 56 creatinine 7.5 hemoglobin 9.6 calcium 7.9      Dialysis Orders: [facility],MWF, 4hr Edw 88.5,2k, 3ca bath uf2 Hec 2 No Hep Venofer 50  No ESA 2/2 CA    Objective:  Vital signs in last 24 hours:  Temp:  [97.9 F (36.6 C)-99 F (37.2 C)] 98.3 F (36.8 C) (08/31 0748) Pulse Rate:  [75-114] 83 (08/31 0930) Resp:  [13-18] 15 (08/31 0748) BP: (103-121)/(26-69) 116/59 (08/31 0930) SpO2:  [95 %-100 %] 100 % (08/31 0748) Weight:  [93.2 kg] 93.2 kg (08/31 0748)  Weight change:  Filed Weights   07/20/21 0840 07/23/21 0900 07/25/21 0748  Weight: 93.4 kg 94.9 kg 93.2 kg    Intake/Output: I/O last 3 completed shifts: In: 430.8 [P.O.:240; I.V.:190.8] Out: -    Intake/Output this shift:  No intake/output data recorded.  CVS- RRR RS- CTAGeneral:well appearing female in NAD Heart:RRR, no mrg Lungs: breath sounds decreased, nml WOB on 2L via [place] Abdomen:soft, NTND Extremities:trace LE edema Dialysis Access: LU AVF +b/t  ABD- BS present soft non-distended EXT- no edema   Basic Metabolic Panel: Recent Labs  Lab 07/20/21 0914 07/20/21 1700 07/23/21 0723  07/24/21 0427 07/25/21 0244  NA 140  --  130* 130* 128*  K 3.8  --  5.1 4.1 4.4  CL 100  --  95* 94* 91*  CO2  --   --  24 25 26   GLUCOSE 86  --  103* 89 93  BUN 31*  --  67* 39* 56*  CREATININE 4.80*  --  9.11* 6.50* 7.50*  CALCIUM  --   --  8.1* 7.6* 7.9*  PHOS  --  4.8* 5.8*  --   --      Liver Function Tests: Recent Labs  Lab 07/23/21 0723  ALBUMIN 3.0*    No results for input(s): LIPASE, AMYLASE in the last 168 hours. No results for input(s): AMMONIA in the last 168 hours.  CBC: Recent Labs  Lab 07/21/21 0325 07/22/21 0425 07/23/21 0442 07/24/21 0427 07/25/21 0244  WBC 6.7 8.0 8.2 7.1 6.7  HGB 10.6* 10.6* 11.0* 9.9* 9.6*  HCT 34.1* 34.2* 37.3 33.1* 30.9*  MCV 100.6* 100.6* 101.1* 100.3* 98.1  PLT 113* 111* 115* 97* 118*     Cardiac Enzymes: No results for input(s): CKTOTAL, CKMB, CKMBINDEX, TROPONINI in the last 168 hours.  BNP: Invalid input(s): POCBNP  CBG: Recent Labs  Lab 07/19/21 2211  GLUCAP 166*     Microbiology: Results for orders placed or performed during the hospital encounter of 07/17/21  Resp Panel by RT-PCR (Flu A&B, Covid) Nasopharyngeal Swab     Status: None   Collection Time: 07/17/21  7:12 PM   Specimen:  Nasopharyngeal Swab; Nasopharyngeal(NP) swabs in vial transport medium  Result Value Ref Range Status   SARS Coronavirus 2 by RT PCR NEGATIVE NEGATIVE Final    Comment: (NOTE) SARS-CoV-2 target nucleic acids are NOT DETECTED.  The SARS-CoV-2 RNA is generally detectable in upper respiratory specimens during the acute phase of infection. The lowest concentration of SARS-CoV-2 viral copies this assay can detect is 138 copies/mL. A negative result does not preclude SARS-Cov-2 infection and should not be used as the sole basis for treatment or other patient management decisions. A negative result may occur with  improper specimen collection/handling, submission of specimen other than nasopharyngeal swab, presence of viral  mutation(s) within the areas targeted by this assay, and inadequate number of viral copies(<138 copies/mL). A negative result must be combined with clinical observations, patient history, and epidemiological information. The expected result is Negative.  Fact Sheet for Patients:  EntrepreneurPulse.com.au  Fact Sheet for Healthcare Providers:  IncredibleEmployment.be  This test is no t yet approved or cleared by the [place] FDA and  has been authorized for detection and/or diagnosis of SARS-CoV-2 by FDA under an Emergency Use Authorization (EUA). This EUA will remain  in effect (meaning this test can be used) for the duration of the COVID-19 declaration under Section 564(b)(1) of the Act, 21 U.S.C.section 360bbb-3(b)(1), unless the authorization is terminated  or revoked sooner.       Influenza A by PCR NEGATIVE NEGATIVE Final   Influenza B by PCR NEGATIVE NEGATIVE Final    Comment: (NOTE) The Xpert Xpress SARS-CoV-2/FLU/RSV plus assay is intended as an aid in the diagnosis of influenza from Nasopharyngeal swab specimens and should not be used as a sole basis for treatment. Nasal washings and aspirates are unacceptable for Xpert Xpress SARS-CoV-2/FLU/RSV testing.  Fact Sheet for Patients: EntrepreneurPulse.com.au  Fact Sheet for Healthcare Providers: IncredibleEmployment.be  This test is not yet approved or cleared by the [place] FDA and has been authorized for detection and/or diagnosis of SARS-CoV-2 by FDA under an Emergency Use Authorization (EUA). This EUA will remain in effect (meaning this test can be used) for the duration of the COVID-19 declaration under Section 564(b)(1) of the Act, 21 U.S.C. section 360bbb-3(b)(1), unless the authorization is terminated or revoked.  Performed at [facility], [place] [address], [place], [place] [address]   MRSA Next Gen by PCR, Nasal      Status: None   Collection Time: 07/18/21 11:23 PM   Specimen: Nasal Mucosa; Nasal Swab  Result Value Ref Range Status   MRSA by PCR Next Gen NOT DETECTED NOT DETECTED Final    Comment: (NOTE) The GeneXpert MRSA Assay (FDA approved for NASAL specimens only), is one component of a comprehensive MRSA colonization surveillance program. It is not intended to diagnose MRSA infection nor to guide or monitor treatment for MRSA infections. Test performance is not FDA approved in patients less than 27 years old. Performed at [facility], [place] [address], [place], [place] [address]     Coagulation Studies: No results for input(s): LABPROT, INR in the last 72 hours.  Urinalysis: No results for input(s): COLORURINE, LABSPEC, PHURINE, GLUCOSEU, HGBUR, BILIRUBINUR, KETONESUR, PROTEINUR, UROBILINOGEN, NITRITE, LEUKOCYTESUR in the last 72 hours.  Invalid input(s): APPERANCEUR    Imaging: IR Bone Tumor(s)RF Ablation  Result Date: 07/24/2021 INDICATION: AARIKA MOON is a 75 y.o. female past medical history significant for ESRD on HD, A. Fib on Eliquis, CHF, and prior hx of duodenal neuroendocrine tumor. She recently underwent  workup for new liver lesions and biopsy performed on 8/15 showed metastatic adenocarcinoma of unknown origin. She was also noted to have pathologic L4 fracture with mild back pain at that time. However, back pain progressed requiring narcotics and hospital admission and resulting in limited mobility. MRI of the lumbar spine was performed on 07/20/2021, confirming pathologic L4 fracture superimposed on severe degenerative changes resulting in severe spinal stenosis at that level. She is not felt to be a good surgical candidate for decompression and, therefore, we were consulted for bone biopsy followed by radiofrequency ablation and kyphoplasty for local disease control and pain in anticipation to the procedure, patient was transitioned from Eliquis to heparin drip. EXAM:  FLUOROSCOPY GUIDED L4 CORE BONE BIOPSY, RADIOFREQUENCY ABLATION AND BALLOON KYPHOPLASTY COMPARISON:  MRI of the lumbar spine July 20, 2021. MEDICATIONS: As antibiotic prophylaxis, Ancef 2 gm IV was ordered pre-procedure and administered intravenously within 1 hour of incision. ANESTHESIA/SEDATION: Moderate (conscious) sedation was employed during this procedure. A total of Versed 1 mg and Fentanyl 25 mcg was administered intravenously. Moderate Sedation Time: 60 minutes. The patient's level of consciousness and vital signs were monitored continuously by radiology nursing throughout the procedure under my direct supervision. FLUOROSCOPY TIME:  Fluoroscopy Time: 21 minutes 54 seconds (2947 mGy) COMPLICATIONS: None immediate. PROCEDURE: Following a full explanation of the procedure along with the potential associated complications, an informed witnessed consent was obtained. The patient was placed in prone position on the angiography table. The lower lumbar spine was prepped and draped in a sterile fashion. Under fluoroscopy, the L4 vertebral body was delineated and the skin area was marked. The skin was infiltrated with a 1% Lidocaine approximately 3.7 cm lateral to the spinous process projection on the right. Using a 22-gauge spinal needle, the soft issue and the peripedicular space and periosteum were infiltrated with Bupivacaine 0.5%. A skin incision was made at the access site. Subsequently, an 11-gauge Kyphon trocar was inserted under fluoroscopic guidance until contact with the pedicle was obtained. The trocar was inserted under light hammer tapping into the pedicle until the posterior boundaries of the vertebral body was reached. The diamond mandril was removed and one core biopsy was obtained. The skin was infiltrated with a 1% Lidocaine approximately 3.7 cm lateral to the spinous process projection on the left. Using a 22-gauge spinal needle, the soft issue and the peripedicular space and periosteum were  infiltrated with Bupivacaine 0.5%. A skin incision was made at the access site. Subsequently, an 11-gauge Kyphon trocar was inserted under fluoroscopic guidance until contact with the pedicle was obtained. The trocar was inserted under light hammer tapping into the pedicle until the posterior boundaries of the vertebral body was reached. The diamond mandril was removed and one core biopsy was obtained. Bone drill was coaxially advanced within the anterior third of each vertebral body for proper radiofrequency probes size selection. An OsteoCool RF probe was inserted bilaterally within the mid-posterior third of the vertebral body. The radiofrequency ablation cycle was performed. Thereafter, the radiofrequency probes were exchanged for inflatable Kyphon balloons, which were centered within the mid-aspect of the vertebral body. The balloons were inflated to create a void to serve as a repository for the bone cement. Both balloons were deflated and through both cannulas, under continuous fluoroscopy guidance in the AP and lateral views, the vertebral body was filled with previously mixed polymethyl-methacrylate (PMMA) added to barium for opacification. Both cannulas were later removed. The access sites were cleaned and covered with a sterile bandage. IMPRESSION:  Successful fluoroscopy-guided bilateral transpedicular approach for L4 vertebral body core bone biopsies, radiofrequency ablation and kyphoplasty for treatment of pathologic fractures. Bone samples obtained were sent for tissue exam. Electronically Signed   By: Pedro Earls M.D.   On: 07/24/2021 13:02   IR KYPHO LUMBAR INC FX REDUCE BONE BX UNI/BIL CANNULATION INC/IMAGING  Result Date: 07/24/2021 INDICATION: BRETTNEY FICKEN is a 75 y.o. female past medical history significant for ESRD on HD, A. Fib on Eliquis, CHF, and prior hx of duodenal neuroendocrine tumor. She recently underwent workup for new liver lesions and biopsy performed on  8/15 showed metastatic adenocarcinoma of unknown origin. She was also noted to have pathologic L4 fracture with mild back pain at that time. However, back pain progressed requiring narcotics and hospital admission and resulting in limited mobility. MRI of the lumbar spine was performed on 07/20/2021, confirming pathologic L4 fracture superimposed on severe degenerative changes resulting in severe spinal stenosis at that level. She is not felt to be a good surgical candidate for decompression and, therefore, we were consulted for bone biopsy followed by radiofrequency ablation and kyphoplasty for local disease control and pain in anticipation to the procedure, patient was transitioned from Eliquis to heparin drip. EXAM: FLUOROSCOPY GUIDED L4 CORE BONE BIOPSY, RADIOFREQUENCY ABLATION AND BALLOON KYPHOPLASTY COMPARISON:  MRI of the lumbar spine July 20, 2021. MEDICATIONS: As antibiotic prophylaxis, Ancef 2 gm IV was ordered pre-procedure and administered intravenously within 1 hour of incision. ANESTHESIA/SEDATION: Moderate (conscious) sedation was employed during this procedure. A total of Versed 1 mg and Fentanyl 25 mcg was administered intravenously. Moderate Sedation Time: 60 minutes. The patient's level of consciousness and vital signs were monitored continuously by radiology nursing throughout the procedure under my direct supervision. FLUOROSCOPY TIME:  Fluoroscopy Time: 21 minutes 54 seconds (9937 mGy) COMPLICATIONS: None immediate. PROCEDURE: Following a full explanation of the procedure along with the potential associated complications, an informed witnessed consent was obtained. The patient was placed in prone position on the angiography table. The lower lumbar spine was prepped and draped in a sterile fashion. Under fluoroscopy, the L4 vertebral body was delineated and the skin area was marked. The skin was infiltrated with a 1% Lidocaine approximately 3.7 cm lateral to the spinous process projection on  the right. Using a 22-gauge spinal needle, the soft issue and the peripedicular space and periosteum were infiltrated with Bupivacaine 0.5%. A skin incision was made at the access site. Subsequently, an 11-gauge Kyphon trocar was inserted under fluoroscopic guidance until contact with the pedicle was obtained. The trocar was inserted under light hammer tapping into the pedicle until the posterior boundaries of the vertebral body was reached. The diamond mandril was removed and one core biopsy was obtained. The skin was infiltrated with a 1% Lidocaine approximately 3.7 cm lateral to the spinous process projection on the left. Using a 22-gauge spinal needle, the soft issue and the peripedicular space and periosteum were infiltrated with Bupivacaine 0.5%. A skin incision was made at the access site. Subsequently, an 11-gauge Kyphon trocar was inserted under fluoroscopic guidance until contact with the pedicle was obtained. The trocar was inserted under light hammer tapping into the pedicle until the posterior boundaries of the vertebral body was reached. The diamond mandril was removed and one core biopsy was obtained. Bone drill was coaxially advanced within the anterior third of each vertebral body for proper radiofrequency probes size selection. An OsteoCool RF probe was inserted bilaterally within the mid-posterior third of the vertebral  body. The radiofrequency ablation cycle was performed. Thereafter, the radiofrequency probes were exchanged for inflatable Kyphon balloons, which were centered within the mid-aspect of the vertebral body. The balloons were inflated to create a void to serve as a repository for the bone cement. Both balloons were deflated and through both cannulas, under continuous fluoroscopy guidance in the AP and lateral views, the vertebral body was filled with previously mixed polymethyl-methacrylate (PMMA) added to barium for opacification. Both cannulas were later removed. The access sites  were cleaned and covered with a sterile bandage. IMPRESSION: Successful fluoroscopy-guided bilateral transpedicular approach for L4 vertebral body core bone biopsies, radiofrequency ablation and kyphoplasty for treatment of pathologic fractures. Bone samples obtained were sent for tissue exam. Electronically Signed   By: Pedro Earls M.D.   On: 07/24/2021 13:02     Medications:      (feeding supplement) PROSource Plus  30 mL Oral BID BM   allopurinol  300 mg Oral QHS   apixaban  5 mg Oral BID   atorvastatin  80 mg Oral QPM   Chlorhexidine Gluconate Cloth  6 each Topical Q0600   cinacalcet  30 mg Oral Q supper   doxercalciferol  2 mcg Intravenous Q M,W,F-HD   ferric citrate  420 mg Oral TID WC   ferrous sulfate  325 mg Oral Daily   gabapentin  100 mg Oral TID   metoprolol tartrate  12.5 mg Oral BID   midodrine  10 mg Oral Daily   multivitamin  1 tablet Oral QHS   senna-docusate  1 tablet Oral BID   vitamin B-12  1,000 mcg Oral Daily   acetaminophen **OR** acetaminophen, albuterol, iohexol, LORazepam, ondansetron **OR** ondansetron (ZOFRAN) IV, oxyCODONE  Assessment/ Plan:  Intractable Back Pain with pathologic fracture of L4 due to metastatic carcinoma.  MRI spine with severe degenerative changes of lumbar spine, lesion likely representing metastatic disease, +L4-L5 compression fracture. pain meds/management per admit/oncology ESRD -  HD MWF, off schedule last week due to staffing issues and increased patient census.  Patient currently receiving dialysis 07/25/2021 R pleural effusion - s/p IR paracentesis 8/26 with 1.2L yield clear amber fluids. CXR with improvement in pleural effusion.  Hypertension/volume - BP well controlled with lopressor 12.5mg  BID and midodrine 10mg  daily.  Does not appear grossly volume overloaded. UF as tolerated.  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 Lorenso Courier oncology noted notes. Palliative care  consulted, patient now DNAR/DNI.  Family meeting yesterday.  Now DNI/DNAR.  Appreciate palliative assistance.  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  Continue Hectorol 54mcg, Sensipar, auryxia Cardiomyopathy w/EF 30-35% Atrial fib on lopressor and Eliquis.     LOS: [place] @TODAY @10 :00 AM

## 2021-07-25 NOTE — Progress Notes (Signed)
Physical Therapy Treatment Patient Details Name: Melissa Adams MRN: [id number] DOB: 1946/06/25 Today's Date: 07/25/2021    History of Present Illness Pt is a 75 yr old female who presented due back pain and reports of Le going out underneath the pt. Pt s/p RFA and balloon kyphoplasty of L4. 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 PMH but not limited to: afib, ESRD, CHF with EF of 30-35%, cariogenic cirrhosis, hypotension, duodenal neuroendocrine tumor, R peural effusion    PT Comments    Patient progressing towards physical therapy goals. Patient more awake/alert this session. Requires encouragement to participate with therapy. Patient required modA for bed mobility and modA+2 for sit to stand transfer and side stepping towards HOB. Patient with bilateral knee buckling noted with fatigue. Continue to recommend SNF for ongoing Physical Therapy.       Follow Up Recommendations  SNF;Supervision/Assistance - 24 hour     Equipment Recommendations  Rolling [name] with 5" wheels    Recommendations for Other Services       Precautions / Restrictions Precautions Precautions: Fall Precaution Comments: lethargy, tremors Restrictions Weight Bearing Restrictions: No    Mobility  Bed Mobility Overal bed mobility: Needs Assistance Bed Mobility: Rolling;Sidelying to Sit;Sit to Sidelying Rolling: Min assist Sidelying to sit: Mod assist     Sit to sidelying: Min assist General bed mobility comments: minA for rolling and modA for trunk elevation. Assist to return LEs back into bed    Transfers Overall transfer level: Needs assistance Equipment used: Rolling [name] (2 wheeled) Transfers: Sit to/from Stand Sit to Stand: Mod assist;+2 physical assistance;+2 safety/equipment;From elevated surface         General transfer comment: modA+2 for safe attempts to stand. Initially patient stood and immediately returned to sitting. On 2nd attempt, patient able to stand 10 seconds prior  to taking side step towards HOB. Cues for hand placement  Ambulation/Gait Ambulation/Gait assistance: Mod assist;+2 physical assistance;+2 safety/equipment Gait Distance (Feet): 2 Feet Assistive device: Rolling [name] (2 wheeled) Gait Pattern/deviations: Step-to pattern Gait velocity: decreased   General Gait Details: side steps towards HOB with modA+2 for balance and RW management. With fatigue, patient quickly returns to sitting without warning   Stairs             Wheelchair Mobility    Modified Rankin (Stroke Patients Only)       Balance Overall balance assessment: Needs assistance Sitting-balance support: Feet supported;Bilateral upper extremity supported Sitting balance-Leahy Scale: Fair     Standing balance support: Bilateral upper extremity supported Standing balance-Leahy Scale: Poor Standing balance comment: reliant on UE support and external assist                            Cognition Arousal/Alertness: Awake/alert Behavior During Therapy: Flat affect Overall Cognitive Status: Impaired/Different from baseline Area of Impairment: Attention;Following commands;Safety/judgement;Awareness;Problem solving                   Current Attention Level: Sustained   Following Commands: Follows one step commands with increased time Safety/Judgement: Decreased awareness of safety;Decreased awareness of deficits Awareness: Emergent Problem Solving: Slow processing;Decreased initiation;Difficulty sequencing;Requires verbal cues;Requires tactile cues General Comments: more awake this session. Requires encouragement to participate. Increased time required to process commands      Exercises      General Comments        Pertinent Vitals/Pain Pain Assessment: Faces Faces Pain Scale: Hurts little more Pain Location: back Pain  Descriptors / Indicators: Grimacing Pain Intervention(s): Monitored during session    Home Living                       Prior Function            PT Goals (current goals can now be found in the care plan section) Acute Rehab PT Goals Patient Stated Goal: none PT Goal Formulation: With patient/family Time For Goal Achievement: 08/07/21 Potential to Achieve Goals: Good Progress towards PT goals: Progressing toward goals    Frequency    Min 3X/week      PT Plan Current plan remains appropriate    Co-evaluation              AM-PAC PT "6 Clicks" Mobility   Outcome Measure  Help needed turning from your back to your side while in a flat bed without using bedrails?: A Little Help needed moving from lying on your back to sitting on the side of a flat bed without using bedrails?: A Lot Help needed moving to and from a bed to a chair (including a wheelchair)?: Total Help needed standing up from a chair using your arms (e.g., wheelchair or bedside chair)?: Total Help needed to walk in hospital room?: Total Help needed climbing 3-5 steps with a railing? : Total 6 Click Score: 9    End of Session Equipment Utilized During Treatment: Gait belt;Oxygen (3L O2 via [place]) Activity Tolerance: Patient tolerated treatment well Patient left: in bed;with call bell/phone within reach;with bed alarm set Nurse Communication: Mobility status PT Visit Diagnosis: Unsteadiness on feet (R26.81);Difficulty in walking, not elsewhere classified (R26.2)     Time: 1740-8144 PT Time Calculation (min) (ACUTE ONLY): 25 min  Charges:  $Gait Training: 8-22 mins $Therapeutic Activity: 8-22 mins                     [name] A. Gilford Rile PT, DPT Acute Rehabilitation Services Pager [phone] Office [phone]    Linna Hoff 07/25/2021, 3:27 PM

## 2021-07-26 DIAGNOSIS — M4856XA Collapsed vertebra, not elsewhere classified, lumbar region, initial encounter for fracture: Secondary | ICD-10-CM | POA: Diagnosis not present

## 2021-07-26 LAB — CBC
HCT: 31.1 % — ABNORMAL LOW (ref 36.0–46.0)
Hemoglobin: 9.6 g/dL — ABNORMAL LOW (ref 12.0–15.0)
MCH: 30.6 pg (ref 26.0–34.0)
MCHC: 30.9 g/dL (ref 30.0–36.0)
MCV: 99 fL (ref 80.0–100.0)
Platelets: 119 10*3/uL — ABNORMAL LOW (ref 150–400)
RBC: 3.14 MIL/uL — ABNORMAL LOW (ref 3.87–5.11)
RDW: 17.5 % — ABNORMAL HIGH (ref 11.5–15.5)
WBC: 7.5 10*3/uL (ref 4.0–10.5)
nRBC: 0 % (ref 0.0–0.2)

## 2021-07-26 LAB — BASIC METABOLIC PANEL
Anion gap: 12 (ref 5–15)
BUN: 28 mg/dL — ABNORMAL HIGH (ref 8–23)
CO2: 26 mmol/L (ref 22–32)
Calcium: 8.1 mg/dL — ABNORMAL LOW (ref 8.9–10.3)
Chloride: 94 mmol/L — ABNORMAL LOW (ref 98–111)
Creatinine, Ser: 5.24 mg/dL — ABNORMAL HIGH (ref 0.44–1.00)
GFR, Estimated: 8 mL/min — ABNORMAL LOW (ref >60)
Glucose, Bld: 95 mg/dL (ref 70–99)
Potassium: 3.7 mmol/L (ref 3.5–5.1)
Sodium: 132 mmol/L — ABNORMAL LOW (ref 135–145)

## 2021-07-26 LAB — GLUCOSE, CAPILLARY: Glucose-Capillary: 121 mg/dL — ABNORMAL HIGH (ref 70–99)

## 2021-07-26 MED ORDER — CHLORHEXIDINE GLUCONATE CLOTH 2 % EX PADS: 6.0000 | MEDICATED_PAD | Freq: Every day | CUTANEOUS | Status: DC

## 2021-07-26 NOTE — Progress Notes (Signed)
Physical Therapy Treatment Patient Details Name: Melissa Adams MRN: [id number] DOB: 1946-08-05 Today's Date: 07/26/2021    History of Present Illness Pt is a 75 yr old female who presented due back pain and reports of Le going out underneath the pt. Pt s/p RFA and balloon kyphoplasty of L4. 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 PMH but not limited to: afib, ESRD, CHF with EF of 30-35%, cariogenic cirrhosis, hypotension, duodenal neuroendocrine tumor, R peural effusion    PT Comments    Patient continues to be limited by fatigue. More sleepy this session. Requires min-modA for bed mobility. Patient requires modA+2 and elevated surface to perform sit to stand with RW. Cues and assist for hand placement to push up from bed and placement on RW. Continue to recommend SNF for ongoing Physical Therapy.       Follow Up Recommendations  SNF;Supervision/Assistance - 24 hour     Equipment Recommendations  Rolling Betsey Sossamon with 5" wheels    Recommendations for Other Services       Precautions / Restrictions Precautions Precautions: Fall Restrictions Weight Bearing Restrictions: No    Mobility  Bed Mobility Overal bed mobility: Needs Assistance Bed Mobility: Rolling;Sidelying to Sit;Sit to Sidelying Rolling: Min assist Sidelying to sit: Mod assist     Sit to sidelying: Min assist General bed mobility comments: minA to roll towards R side and modA to lift trunk. minA to return LEs back to bed    Transfers Overall transfer level: Needs assistance Equipment used: Rolling Hatsue Sime (2 wheeled) Transfers: Sit to/from Stand Sit to Stand: Mod assist;+2 physical assistance;+2 safety/equipment;From elevated surface         General transfer comment: With bed elevated, modA+2 to stand from EOB. Cues and assist for hand placement to push up from bed and on RW. Patient with significant trunk flexion and leaning on RW. Cues for upright posture. Sit to stand x  3  Ambulation/Gait Ambulation/Gait assistance: Mod assist;+2 physical assistance;+2 safety/equipment Gait Distance (Feet): 2 Feet Assistive device: Rolling Tascha Casares (2 wheeled) Gait Pattern/deviations: Step-to pattern Gait velocity: decreased   General Gait Details: side steps towards HOB. Unable to progress away from EOB due to weakness and complaints of dizziness. ModA+2 for balance and RW management   Stairs             Wheelchair Mobility    Modified Rankin (Stroke Patients Only)       Balance Overall balance assessment: Needs assistance Sitting-balance support: Feet supported;Bilateral upper extremity supported Sitting balance-Leahy Scale: Fair     Standing balance support: Bilateral upper extremity supported Standing balance-Leahy Scale: Poor Standing balance comment: reliant on UE support and external assist                            Cognition Arousal/Alertness: Awake/alert Behavior During Therapy: Flat affect Overall Cognitive Status: Impaired/Different from baseline Area of Impairment: Attention;Following commands;Safety/judgement;Awareness;Problem solving                   Current Attention Level: Sustained   Following Commands: Follows one step commands with increased time Safety/Judgement: Decreased awareness of safety;Decreased awareness of deficits Awareness: Emergent Problem Solving: Slow processing;Decreased initiation;Difficulty sequencing;Requires verbal cues;Requires tactile cues General Comments: more sleepy this session but willing to participate. Increased time to complete tasks      Exercises      General Comments General comments (skin integrity, edema, etc.): On 2L O2 [place], spO2>98% throughout  Pertinent Vitals/Pain Pain Assessment: No/denies pain    Home Living                      Prior Function            PT Goals (current goals can now be found in the care plan section) Acute Rehab PT  Goals Patient Stated Goal: to get some sleep and not go to an old person's home PT Goal Formulation: With patient/family Time For Goal Achievement: 08/07/21 Potential to Achieve Goals: Good Progress towards PT goals: Progressing toward goals    Frequency    Min 3X/week      PT Plan Current plan remains appropriate    Co-evaluation              AM-PAC PT "6 Clicks" Mobility   Outcome Measure  Help needed turning from your back to your side while in a flat bed without using bedrails?: A Little Help needed moving from lying on your back to sitting on the side of a flat bed without using bedrails?: A Lot Help needed moving to and from a bed to a chair (including a wheelchair)?: Total Help needed standing up from a chair using your arms (e.g., wheelchair or bedside chair)?: Total Help needed to walk in hospital room?: Total Help needed climbing 3-5 steps with a railing? : Total 6 Click Score: 9    End of Session Equipment Utilized During Treatment: Gait belt;Oxygen Activity Tolerance: Patient tolerated treatment well Patient left: in bed;with call bell/phone within reach;with bed alarm set Nurse Communication: Mobility status PT Visit Diagnosis: Unsteadiness on feet (R26.81);Difficulty in walking, not elsewhere classified (R26.2)     Time: 7858-8502 PT Time Calculation (min) (ACUTE ONLY): 25 min  Charges:  $Gait Training: 8-22 mins $Therapeutic Activity: 8-22 mins                     Claude Waldman A. Gilford Rile PT, DPT Acute Rehabilitation Services Pager [phone] Office [phone]    Linna Hoff 07/26/2021, 3:14 PM

## 2021-07-26 NOTE — Progress Notes (Signed)
PROGRESS NOTE  Melissa Adams  DOB: [birth year]  PCP: [name], NP UUV:[id number]  DOA: 07/17/2021  LOS: 7 days  Hospital Day: 10   Chief complaint: Back pain  Brief narrative: Melissa Adams is a 75 y.o. 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 Patient follows up with oncologist Dr. Lorenso Courier.  She had an MRI of the abdomen under anesthesia done on 06/19/2021 which showed innumerable enhancing lesions throughout both lobes of liver concerning for metastatic disease. On 06/28/2021, she was seen by Dr. Lorenso Courier in the office and had a CT abdomen and pelvis done on 8/12.  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  She also had a small to moderate right pleural effusion for which she sees pulmonologist Dr. Erin Fulling.  She had a thoracentesis in May which did not show malignant cells and cytology. On 8/15, she underwent a liver biopsy that showed adenocarcinoma of unknown primary. On 8/23, patient went to Dr. August Albino office for follow-up.  She remains in excruciating pain in her lower back over the past few weeks because of which she has not been able to walk on her own. Because of the severity of her pain, she was sent to the ED for inpatient management. Chest x-ray showed right middle lobe consolidation with moderate right pleural effusion. Patient was admitted to hospitalist service for further evaluation management  Subjective: Patient was seen and examined this afternoon.  Propped up in bed.   Not in distress. Pending SNF placement.   Assessment/Plan: Intractable right-sided lower back pain Pathological fracture of L4 vertebrae due to metastatic disease -8/26, underwent MRI of lumbar  spine under anesthesia with C needed because of claustrophobia.  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  Also has multilevel moderate to severe neural foraminal narrowing in the lumbar region.  -Oncology, IR and neurosurgery consulted.  Patient is a poor candidate for neurosurgical intervention. 8/30, patient underwent vertebroplasty by IR. -At this time, continue pain control with as needed oral oxycodone as needed. -PT eval obtained.  SNF recommended. -Palliative care consult appreciated. Had a family meeting on 8/27.  Patient and family wanted to continue treatment measures at this time keeping her DNR/DNI.  Metastatic adenocarcinoma of unknown primary -Shown on liver biopsy from 8/15. -Unable to go for previously planned outpatient breast biopsy because patient is hospitalized.   -8/30, IR did a vertebral biopsy, pending report  Right pleural effusion -8/26, underwent thoracentesis with removal of 1.2 L of pleural fluid.  Respiratory status stable.  ESRD-HD-MWF -Nephrology consulted -Continue midodrine  Chronic systolic CHF Cardiogenic liver cirrhosis -Last EF 30 to 35%.  Patient was on Toprol-XL as a result high dose of Lasix.  With poor oral intake and intermittent hypotension, Lasix was kept on hold.  Toprol to continue at a lower dose.  A. fib  -Continue Toprol at a lower dose.  Eliquis was held for vertebroplasty.  Resumed postprocedure  Mobility: PT eval after pain control Code Status:   Code Status: DNR  Nutritional status: Body mass index is 35.73 kg/m.     Diet:  Diet Order  Diet renal with fluid restriction Fluid restriction: 1200 mL Fluid; Room service appropriate? Yes; Fluid consistency: Thin  Diet effective now                  DVT prophylaxis:   apixaban (ELIQUIS) tablet 5 mg    Antimicrobials: None Fluid: None Consultants: Oncology, IR Family Communication: Called and updated patient's daughter Ms. Patty on 8/30  Status is: Inpatient  Remains inpatient appropriate because: Pending SNF  Dispo: The patient is from: Home              Anticipated d/c is to:  Pending SNF              Patient currently is medically stable to d/c.   Difficult to place patient No     Infusions:     Scheduled Meds:  (feeding supplement) PROSource Plus  30 mL Oral BID BM   allopurinol  300 mg Oral QHS   apixaban  5 mg Oral BID   atorvastatin  80 mg Oral QPM   Chlorhexidine Gluconate Cloth  6 each Topical Q0600   cinacalcet  30 mg Oral Q supper   doxercalciferol  2 mcg Intravenous Q M,W,F-HD   ferric citrate  420 mg Oral TID WC   ferrous sulfate  325 mg Oral Daily   gabapentin  100 mg Oral TID   metoprolol tartrate  12.5 mg Oral BID   midodrine  10 mg Oral Daily   multivitamin  1 tablet Oral QHS   senna-docusate  1 tablet Oral BID   vitamin B-12  1,000 mcg Oral Daily    Antimicrobials: Anti-infectives (From admission, onward)    Start     Dose/Rate Route Frequency Ordered Stop   07/23/21 0000  ceFAZolin (ANCEF) IVPB 2g/100 mL premix        2 g 200 mL/hr over 30 Minutes Intravenous On call 07/22/21 1422 07/23/21 1410       PRN meds: acetaminophen **OR** acetaminophen, albuterol, iohexol, LORazepam, ondansetron **OR** ondansetron (ZOFRAN) IV, oxyCODONE   Objective: Vitals:   07/26/21 0730 07/26/21 1135  BP: (!) 98/56 91/68  Pulse: 96 96  Resp: 18 20  Temp: 98.3 F (36.8 C) 98.4 F (36.9 C)  SpO2: 98% 99%    Intake/Output Summary (Last 24 hours) at 07/26/2021 1254 Last data filed at 07/26/2021 0800 Gross per 24 hour  Intake 120 ml  Output 1 ml  Net 119 ml    Filed Weights   07/23/21 0900 07/25/21 0748 07/25/21 1128  Weight: 94.9 kg 93.2 kg 91.5 kg   Weight change:  Body mass index is 35.73 kg/m.   Physical Exam: General exam: Pleasant,  elderly African-American female.  Pain controlled.  Not in distress Skin: No rashes, lesions or ulcers. HEENT: Atraumatic, normocephalic, no obvious bleeding Lungs: Clear to auscultation bilaterally CVS: Regular rate and rhythm, no murmur GI/Abd soft, nontender, nondistended, bowel sound present CNS: Alert, awake, oriented x3. psychiatry: Depressed look Extremities: No pedal edema, no calf tenderness  Data Review: I have personally reviewed the laboratory data and studies available.  Recent Labs  Lab 07/22/21 0425 07/23/21 0442 07/24/21 0427 07/25/21 0244 07/26/21 0505  WBC 8.0 8.2 7.1 6.7 7.5  HGB 10.6* 11.0* 9.9* 9.6* 9.6*  HCT 34.2* 37.3 33.1* 30.9* 31.1*  MCV 100.6* 101.1* 100.3* 98.1 99.0  PLT 111* 115* 97* 118* 119*    Recent Labs  Lab 07/20/21 0914 07/20/21 1700 07/23/21 0723 07/24/21 0427 07/25/21 0244 07/26/21 0505  NA 140  --  130* 130* 128* 132*  K 3.8  --  5.1 4.1 4.4 3.7  CL 100  --  95* 94* 91* 94*  CO2  --   --  24 25 26 26   GLUCOSE 86  --  103* 89 93 95  BUN 31*  --  67* 39* 56* 28*  CREATININE 4.80*  --  9.11* 6.50* 7.50* 5.24*  CALCIUM  --   --  8.1* 7.6* 7.9* 8.1*  PHOS  --  4.8* 5.8*  --   --   --      F/u labs ordered Unresulted Labs (From admission, onward)     Start     Ordered   07/21/21 0500  CBC  Daily,   R     Question:  Specimen collection method  Answer:  Lab=Lab collect   07/20/21 1840   Signed and Held  Renal function panel  Once,   R       Question:  Specimen collection method  Answer:  Lab=Lab collect   Signed and Held   Signed and Held  CBC  Once,   R       Question:  Specimen collection method  Answer:  Lab=Lab collect   Signed and Held   Signed and Held  Renal function panel  Once,   R       Question:  Specimen collection method  Answer:  Lab=Lab collect   Signed and Held   Signed and Held  CBC  Once,   R       Question:  Specimen collection method  Answer:  Lab=Lab collect   Signed and Held             Signed, Melissa Croak, MD [facility] 07/26/2021

## 2021-07-26 NOTE — TOC Initial Note (Signed)
Transition of Care [facility]) - Initial/Assessment Note    Patient Details  Name: Melissa Adams MRN: [id number] Date of Birth: 05/07/1946  [facility]) CM/SW Contact:    Emeterio Reeve, LCSW Phone Number: 07/26/2021, 3:55 PM  Clinical Narrative:                  CSW received SNF consult. CSW spoke to pts daughter on. CSW introduced self and explained role at the hospital. Pt daughter reports that PTA she lived at home with daughter and son in law. Pt was independent with mobility and ADL's.   CSW reviewed PT/OT recommendations for SNF. Pt daughter reports they feel like SNF will be good for pt. Pt gave CSW permission to fax out to facilities in the area. Pt has no preference of facility at this time. CSW gave pt medicare.gov rating list to review. CSW explained insurance auth process. Pt reports they are covid vaccinated. Pt [place] HD MWF [facility] [place] [facility] [place] will continue to follow.   Expected Discharge Plan: Skilled Nursing Facility Barriers to Discharge: Continued Medical Work up, Ship broker   Patient Goals and CMS Choice Patient states their goals for this hospitalization and ongoing recovery are:: to get better CMS Medicare.gov Compare Post Acute Care list provided to:: Patient Choice offered to / list presented to : Patient  Expected Discharge Plan and Services Expected Discharge Plan: [place]       Living arrangements for the past 2 months: Apartment                                      Prior Living Arrangements/Services Living arrangements for the past 2 months: Apartment Lives with:: Adult Children Patient language and need for interpreter reviewed:: Yes Do you feel safe going back to the place where you live?: Yes      Need for Family Participation in Patient Care: Yes (Comment) Care giver support system in place?: Yes (comment)   Criminal Activity/Legal Involvement Pertinent to Current  Situation/Hospitalization: No - Comment as needed  Activities of Daily Living Home Assistive Devices/Equipment: Walker (specify type) ADL Screening (condition at time of admission) Patient's cognitive ability adequate to safely complete daily activities?: Yes Is the patient deaf or have difficulty hearing?: No Does the patient have difficulty seeing, even when wearing glasses/contacts?: No Does the patient have difficulty concentrating, remembering, or making decisions?: No Patient able to express need for assistance with ADLs?: Yes Does the patient have difficulty dressing or bathing?: No Does the patient have difficulty walking or climbing stairs?: Yes Weakness of Legs: Both  Permission Sought/Granted Permission sought to share information with : Family Supports, Chartered certified accountant granted to share information with : Yes, Verbal Permission Granted     Permission granted to share info w AGENCY: SNF        Emotional Assessment Appearance:: Appears stated age Attitude/Demeanor/Rapport: Engaged Affect (typically observed): Appropriate Orientation: : Oriented to Self, Oriented to Place, Oriented to  Time Alcohol / Substance Use: Not Applicable Psych Involvement: No (comment)  Admission diagnosis:  Generalized weakness [R53.1] Pathologic compression fracture of lumbar vertebra (HCC) [T06.26RS] Adenocarcinoma determined by biopsy of liver ([place]) [C22.9] Acute midline low back pain, unspecified whether sciatica present [M54.50] Patient Active Problem List   Diagnosis Date Noted   Pathologic compression fracture of lumbar vertebra ([place]) 07/18/2021   Metastatic adenocarcinoma to  liver ([place]) 07/18/2021   Abnormal findings on esophagogastroduodenoscopy (EGD) 01/27/2021   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   Benign carcinoid tumor of duodenum    Benign neoplasm of colon    Cirrhosis of liver with ascites ([place])    Melena    GI bleed  11/13/2020   Upper GI bleeding 30/13/1438   Chronic systolic CHF (congestive heart failure) ([place]) 11/13/2020   Acute blood loss anemia 11/13/2020   Chronic anticoagulation 11/13/2020   Pleural effusion 06/11/2020   Aortic atherosclerosis ([place]) 06/11/2020   ESRD on dialysis ([place]) 01/11/2020   Acute on chronic systolic heart failure (HCC)    Pressure injury of skin 10/22/2019   Dyspnea    COVID-19 virus infection    CHF exacerbation ([place]) 10/12/2019   Acute CHF (congestive heart failure) ([place]) 10/11/2019   Atrial fibrillation, chronic ([place]) 10/11/2019   Essential hypertension 10/11/2019   Sleep apnea 10/11/2019   AKI (acute kidney injury) ([place]) 10/11/2019   Gout 10/11/2019   Anemia 10/11/2019   PCP:  [name], NP Pharmacy:   [place], [place] [address] Phone: [phone] Fax: [place], [place] [address] [place] [address] Phone: [phone] Fax: [phone]     Social Determinants of Health ([place]) Interventions    Readmission Risk Interventions Readmission Risk Prevention Plan 06/14/2020  Transportation Screening Complete  Medication Review ([place]) Complete  PCP or Specialist appointment within 3-5 days of discharge Complete  HRI or [place] Complete  SW Recovery Care/Counseling Consult Complete  Palliative Care Screening Not [place] Not Applicable  Some recent data might be hidden    Emeterio Reeve, LCSW Clinical Social Worker

## 2021-07-26 NOTE — Progress Notes (Signed)
The chaplain offered the hospital notary service for the Pt. Advance Directive during today's Pt. visit.   The chaplain understands the Pt. is tired today and wants to rest. The chaplain honored the Pt. choice to wait on her daughter to be present for the signing of the AD.  The chaplain informed the Pt. the AD can be notarized outside the hospital upon d/c.  The Pt. AD is in her chart.    This chaplain is available for F/U spiritual care as needed.

## 2021-07-26 NOTE — NC FL2 (Signed)
[place] LEVEL OF CARE SCREENING TOOL     IDENTIFICATION  Patient Name: Melissa Adams Birthdate: [birth year] Sex: female Admission Date (Current Location): 07/17/2021  [facility] and [place] Number:  Herbalist and Address:  [facility] [place], [place] [address], [place], [place] [address]      Provider Number: [id number]  Attending Physician Name and Address:  Terrilee Croak, MD  Relative Name and Phone Number:       Current Level of Care: Hospital Recommended Level of Care: [place] Prior Approval Number:    Date Approved/Denied:   PASRR Number: [id number]  Discharge Plan: SNF    Current Diagnoses: Patient Active Problem List   Diagnosis Date Noted   Pathologic compression fracture of lumbar vertebra ([place]) 07/18/2021   Metastatic adenocarcinoma to liver ([place]) 07/18/2021   Abnormal findings on esophagogastroduodenoscopy (EGD) 01/27/2021   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   Benign carcinoid tumor of duodenum    Benign neoplasm of colon    Cirrhosis of liver with ascites ([place])    Melena    GI bleed 11/13/2020   Upper GI bleeding 85/92/9244   Chronic systolic CHF (congestive heart failure) ([place]) 11/13/2020   Acute blood loss anemia 11/13/2020   Chronic anticoagulation 11/13/2020   Pleural effusion 06/11/2020   Aortic atherosclerosis ([place]) 06/11/2020   ESRD on dialysis ([place]) 01/11/2020   Acute on chronic systolic heart failure (HCC)    Pressure injury of skin 10/22/2019   Dyspnea    COVID-19 virus infection    CHF exacerbation ([place]) 10/12/2019   Acute CHF (congestive heart failure) ([place]) 10/11/2019   Atrial fibrillation, chronic ([place]) 10/11/2019   Essential hypertension 10/11/2019   Sleep apnea 10/11/2019   AKI (acute kidney injury) ([place]) 10/11/2019   Gout 10/11/2019   Anemia 10/11/2019    Orientation RESPIRATION BLADDER Height & Weight     Time, Self, Place  Normal Continent  Weight: 201 lb 11.5 oz (91.5 kg) Height:  5\' 3"  (160 cm)  BEHAVIORAL SYMPTOMS/MOOD NEUROLOGICAL BOWEL NUTRITION STATUS      Continent Diet  AMBULATORY STATUS COMMUNICATION OF NEEDS Skin   Extensive Assist   Normal                       Personal Care Assistance Level of Assistance  Dressing, Bathing, Feeding Bathing Assistance: Limited assistance Feeding assistance: Limited assistance Dressing Assistance: Limited assistance     Functional Limitations Info  Sight, Hearing, Speech Sight Info: Adequate Hearing Info: Adequate Speech Info: Adequate    SPECIAL CARE FACTORS FREQUENCY  PT (By licensed PT), OT (By licensed OT)     PT Frequency: 5x a week OT Frequency: 5x a week            Contractures Contractures Info: Not present    Additional Factors Info  Code Status, Allergies Code Status Info: Full Allergies Info: Black Psychologist, clinical Allergy           Current Medications (07/26/2021):  This is the current hospital active medication list Current Facility-Administered Medications  Medication Dose Route Frequency Provider Last Rate Last Admin   (feeding supplement) PROSource Plus liquid 30 mL  30 mL Oral BID BM Etta Quill, DO   30 mL at 07/26/21 1206   acetaminophen (TYLENOL) tablet 650 mg  650 mg Oral Q6H PRN Etta Quill, DO   650 mg at 07/22/21 1436   Or  acetaminophen (TYLENOL) suppository 650 mg  650 mg Rectal Q6H PRN Etta Quill, DO       albuterol (PROVENTIL) (2.5 MG/3ML) 0.083% nebulizer solution 2.5 mg  2.5 mg Inhalation Q6H PRN Etta Quill, DO       allopurinol (ZYLOPRIM) tablet 300 mg  300 mg Oral QHS Jennette Kettle M, DO   300 mg at 07/25/21 2124   apixaban (ELIQUIS) tablet 5 mg  5 mg Oral BID Bertis Ruddy, RPH   5 mg at 07/26/21 1205   atorvastatin (LIPITOR) tablet 80 mg  80 mg Oral QPM Etta Quill, DO   80 mg at 07/25/21 1751   Chlorhexidine Gluconate Cloth 2 % PADS 6 each  6 each Topical Q0600 Edrick Oh, MD    6 each at 07/26/21 0759   cinacalcet (SENSIPAR) tablet 30 mg  30 mg Oral Q supper Etta Quill, DO   30 mg at 07/25/21 1751   doxercalciferol (HECTOROL) injection 2 mcg  2 mcg Intravenous Q M,W,F-HD Donnamae Jude, RPH   2 mcg at 07/25/21 0950   ferric citrate (AURYXIA) tablet 420 mg  420 mg Oral TID WC Etta Quill, DO   420 mg at 07/26/21 1206   ferrous sulfate tablet 325 mg  325 mg Oral Daily Donnamae Jude, RPH   325 mg at 07/26/21 1206   gabapentin (NEURONTIN) capsule 100 mg  100 mg Oral TID Etta Quill, DO   100 mg at 07/26/21 1205   iohexol (OMNIPAQUE) 240 MG/ML injection 50 mL  50 mL Intravenous Once PRN de Sindy Messing, Erven Colla, MD       LORazepam (ATIVAN) tablet 1 mg  1 mg Oral Once PRN Etta Quill, DO       metoprolol tartrate (LOPRESSOR) tablet 12.5 mg  12.5 mg Oral BID Dahal, Marlowe Aschoff, MD   12.5 mg at 07/26/21 1205   midodrine (PROAMATINE) tablet 10 mg  10 mg Oral Daily Jennette Kettle M, DO   10 mg at 07/26/21 1205   multivitamin (RENA-VIT) tablet 1 tablet  1 tablet Oral QHS Etta Quill, DO   1 tablet at 07/25/21 2124   ondansetron (ZOFRAN) tablet 4 mg  4 mg Oral Q6H PRN Etta Quill, DO       Or   ondansetron [facility] injection 4 mg  4 mg Intravenous Q6H PRN Etta Quill, DO       oxyCODONE (Oxy IR/ROXICODONE) immediate release tablet 5 mg  5 mg Oral Q6H PRN Dahal, Marlowe Aschoff, MD       senna-docusate (Senokot-S) tablet 1 tablet  1 tablet Oral BID Rosezella Rumpf, NP   1 tablet at 07/26/21 1205   vitamin B-12 (CYANOCOBALAMIN) tablet 1,000 mcg  1,000 mcg Oral Daily Jennette Kettle M, DO   1,000 mcg at 07/26/21 1206     Discharge Medications: Please see discharge summary for a list of discharge medications.  Relevant Imaging Results:  Relevant Lab Results:   Additional Information SSN [id number]  Emeterio Reeve, LCSW

## 2021-07-26 NOTE — Progress Notes (Signed)
[facility] ROUNDING NOTE   Subjective:   Interval History: This is a 75 year old lady end-stage renal disease Monday Wednesday Friday dialysis.  Last dialysis treatment was 07/19/2021.  Next dialysis treatment will be 07/23/2021.  She was admitted with intractable back pain pathological fracture L4 due to metastatic carcinoma.  She has metastatic disease to the liver and spine with questionable breast primary source.  She is currently being followed by palliative medicine.  She has a history of cardiomyopathy with ejection fraction 30 to 35% and atrial fibrillation which is rate controlled.  She is not requiring any ESA due to the history of cancer   Blood pressure 101/72 pulse 97 temperature 98.3 O2 sats 90% nasal cannula  Last dialysis 07/25/2021 Next dialysis 07/27/2021 1.5 L removed   Sodium 132 potassium 3.7 chloride 94 CO2 26 BUN 28 creatinine 5.24 glucose 95 hemoglobin 9.6      Dialysis Orders: adm farm,MWF, 4hr Edw 88.5,2k, 3ca bath uf2 Hec 2 No Hep Venofer 50  No ESA 2/2 CA    Objective:  Vital signs in last 24 hours:  Temp:  [98.3 F (36.8 C)-98.8 F (37.1 C)] 98.3 F (36.8 C) (09/01 0730) Pulse Rate:  [81-107] 96 (09/01 0730) Resp:  [18-19] 18 (09/01 0730) BP: (97-145)/(42-71) 98/56 (09/01 0730) SpO2:  [95 %-100 %] 98 % (09/01 0730) Weight:  [91.5 kg] 91.5 kg (08/31 1128)  Weight change:  Filed Weights   07/23/21 0900 07/25/21 0748 07/25/21 1128  Weight: 94.9 kg 93.2 kg 91.5 kg    Intake/Output: I/O last 3 completed shifts: In: -  Out: 1501 [Other:1500; Stool:1]   Intake/Output this shift:  No intake/output data recorded.  CVS- RRR RS- CTAGeneral:well appearing female in NAD Heart:RRR, no mrg Lungs: breath sounds decreased, nml WOB on 2L via [place] Abdomen:soft, NTND Extremities:trace LE edema Dialysis Access: LU AVF +b/t  ABD- BS present soft non-distended EXT- no edema   Basic Metabolic Panel: Recent Labs  Lab 07/20/21 0914  07/20/21 1700 07/23/21 0723 07/23/21 0723 07/24/21 0427 07/25/21 0244 07/26/21 0505  NA 140  --  130*  --  130* 128* 132*  K 3.8  --  5.1  --  4.1 4.4 3.7  CL 100  --  95*  --  94* 91* 94*  CO2  --   --  24  --  25 26 26   GLUCOSE 86  --  103*  --  89 93 95  BUN 31*  --  67*  --  39* 56* 28*  CREATININE 4.80*  --  9.11*  --  6.50* 7.50* 5.24*  CALCIUM  --   --  8.1*   < > 7.6* 7.9* 8.1*  PHOS  --  4.8* 5.8*  --   --   --   --    < > = values in this interval not displayed.     Liver Function Tests: Recent Labs  Lab 07/23/21 0723  ALBUMIN 3.0*    No results for input(s): LIPASE, AMYLASE in the last 168 hours. No results for input(s): AMMONIA in the last 168 hours.  CBC: Recent Labs  Lab 07/22/21 0425 07/23/21 0442 07/24/21 0427 07/25/21 0244 07/26/21 0505  WBC 8.0 8.2 7.1 6.7 7.5  HGB 10.6* 11.0* 9.9* 9.6* 9.6*  HCT 34.2* 37.3 33.1* 30.9* 31.1*  MCV 100.6* 101.1* 100.3* 98.1 99.0  PLT 111* 115* 97* 118* 119*     Cardiac Enzymes: No results for input(s): CKTOTAL, CKMB, CKMBINDEX, TROPONINI in the last 168  hours.  BNP: Invalid input(s): POCBNP  CBG: Recent Labs  Lab 07/19/21 2211  GLUCAP 166*     Microbiology: Results for orders placed or performed during the hospital encounter of 07/17/21  Resp Panel by RT-PCR (Flu A&B, Covid) Nasopharyngeal Swab     Status: None   Collection Time: 07/17/21  7:12 PM   Specimen: Nasopharyngeal Swab; Nasopharyngeal(NP) swabs in vial transport medium  Result Value Ref Range Status   SARS Coronavirus 2 by RT PCR NEGATIVE NEGATIVE Final    Comment: (NOTE) SARS-CoV-2 target nucleic acids are NOT DETECTED.  The SARS-CoV-2 RNA is generally detectable in upper respiratory specimens during the acute phase of infection. The lowest concentration of SARS-CoV-2 viral copies this assay can detect is 138 copies/mL. A negative result does not preclude SARS-Cov-2 infection and should not be used as the sole basis for treatment  or other patient management decisions. A negative result may occur with  improper specimen collection/handling, submission of specimen other than nasopharyngeal swab, presence of viral mutation(s) within the areas targeted by this assay, and inadequate number of viral copies(<138 copies/mL). A negative result must be combined with clinical observations, patient history, and epidemiological information. The expected result is Negative.  Fact Sheet for Patients:  EntrepreneurPulse.com.au  Fact Sheet for Healthcare Providers:  IncredibleEmployment.be  This test is no t yet approved or cleared by the [place] FDA and  has been authorized for detection and/or diagnosis of SARS-CoV-2 by FDA under an Emergency Use Authorization (EUA). This EUA will remain  in effect (meaning this test can be used) for the duration of the COVID-19 declaration under Section 564(b)(1) of the Act, 21 U.S.C.section 360bbb-3(b)(1), unless the authorization is terminated  or revoked sooner.       Influenza A by PCR NEGATIVE NEGATIVE Final   Influenza B by PCR NEGATIVE NEGATIVE Final    Comment: (NOTE) The Xpert Xpress SARS-CoV-2/FLU/RSV plus assay is intended as an aid in the diagnosis of influenza from Nasopharyngeal swab specimens and should not be used as a sole basis for treatment. Nasal washings and aspirates are unacceptable for Xpert Xpress SARS-CoV-2/FLU/RSV testing.  Fact Sheet for Patients: EntrepreneurPulse.com.au  Fact Sheet for Healthcare Providers: IncredibleEmployment.be  This test is not yet approved or cleared by the [place] FDA and has been authorized for detection and/or diagnosis of SARS-CoV-2 by FDA under an Emergency Use Authorization (EUA). This EUA will remain in effect (meaning this test can be used) for the duration of the COVID-19 declaration under Section 564(b)(1) of the Act, 21 U.S.C. section  360bbb-3(b)(1), unless the authorization is terminated or revoked.  Performed at [facility], [place] [address] [place], [place] [address]   MRSA Next Gen by PCR, Nasal     Status: None   Collection Time: 07/18/21 11:23 PM   Specimen: Nasal Mucosa; Nasal Swab  Result Value Ref Range Status   MRSA by PCR Next Gen NOT DETECTED NOT DETECTED Final    Comment: (NOTE) The GeneXpert MRSA Assay (FDA approved for NASAL specimens only), is one component of a comprehensive MRSA colonization surveillance program. It is not intended to diagnose MRSA infection nor to guide or monitor treatment for MRSA infections. Test performance is not FDA approved in patients less than 29 years old. Performed at [facility], [place] [address] [place], [place] [address]     Coagulation Studies: No results for input(s): LABPROT, INR in the last 72 hours.  Urinalysis: No results for input(s): COLORURINE, LABSPEC, Carl,  GLUCOSEU, HGBUR, BILIRUBINUR, KETONESUR, PROTEINUR, UROBILINOGEN, NITRITE, LEUKOCYTESUR in the last 72 hours.  Invalid input(s): APPERANCEUR    Imaging: No results found.   Medications:      (feeding supplement) PROSource Plus  30 mL Oral BID BM   allopurinol  300 mg Oral QHS   apixaban  5 mg Oral BID   atorvastatin  80 mg Oral QPM   Chlorhexidine Gluconate Cloth  6 each Topical Q0600   cinacalcet  30 mg Oral Q supper   doxercalciferol  2 mcg Intravenous Q M,W,F-HD   ferric citrate  420 mg Oral TID WC   ferrous sulfate  325 mg Oral Daily   gabapentin  100 mg Oral TID   metoprolol tartrate  12.5 mg Oral BID   midodrine  10 mg Oral Daily   multivitamin  1 tablet Oral QHS   senna-docusate  1 tablet Oral BID   vitamin B-12  1,000 mcg Oral Daily   acetaminophen **OR** acetaminophen, albuterol, iohexol, LORazepam, ondansetron **OR** ondansetron (ZOFRAN) IV, oxyCODONE  Assessment/ Plan:  Intractable Back Pain with pathologic fracture of L4 due to metastatic carcinoma.   MRI spine with severe degenerative changes of lumbar spine, lesion likely representing metastatic disease, +L4-L5 compression fracture. pain meds/management per admit/oncology ESRD -  HD MWF, off schedule last week due to staffing issues and increased patient census.  Next dialysis 07/27/2021 R pleural effusion - s/p IR paracentesis 8/26 with 1.2L yield clear amber fluids. CXR with improvement in pleural effusion.  Hypertension/volume - BP well controlled with lopressor 12.5mg  BID and midodrine 10mg  daily.  Does not appear grossly volume overloaded. UF as tolerated.  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 Lorenso Courier oncology noted notes. Palliative care consulted, patient now DNAR/DNI.  Family meeting yesterday.  Now DNI/DNAR.  Appreciate palliative assistance.  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     LOS: [place] @TODAY @8 :46 AM

## 2021-07-27 DIAGNOSIS — M4856XA Collapsed vertebra, not elsewhere classified, lumbar region, initial encounter for fracture: Secondary | ICD-10-CM | POA: Diagnosis not present

## 2021-07-27 LAB — CBC
HCT: 30.4 % — ABNORMAL LOW (ref 36.0–46.0)
Hemoglobin: 9.2 g/dL — ABNORMAL LOW (ref 12.0–15.0)
MCH: 30 pg (ref 26.0–34.0)
MCHC: 30.3 g/dL (ref 30.0–36.0)
MCV: 99 fL (ref 80.0–100.0)
Platelets: 130 10*3/uL — ABNORMAL LOW (ref 150–400)
RBC: 3.07 MIL/uL — ABNORMAL LOW (ref 3.87–5.11)
RDW: 17.4 % — ABNORMAL HIGH (ref 11.5–15.5)
WBC: 7.8 10*3/uL (ref 4.0–10.5)
nRBC: 0 % (ref 0.0–0.2)

## 2021-07-27 LAB — SURGICAL PATHOLOGY

## 2021-07-27 MED ORDER — HEPARIN SODIUM (PORCINE) 1000 UNIT/ML DIALYSIS
1000.0000 [IU] | INTRAMUSCULAR | Status: DC | PRN
Start: 2021-07-27 — End: 2021-07-27

## 2021-07-27 MED ORDER — PENTAFLUOROPROP-TETRAFLUOROETH EX AERO: 1.0000 "application " | INHALATION_SPRAY | CUTANEOUS | Status: DC | PRN

## 2021-07-27 MED ORDER — LIDOCAINE HCL (PF) 1 % IJ SOLN: 5.0000 mL | INTRAMUSCULAR | Status: DC | PRN

## 2021-07-27 MED ORDER — SODIUM CHLORIDE 0.9 % IV SOLN: 100.0000 mL | INTRAVENOUS | Status: DC | PRN

## 2021-07-27 MED ORDER — ALTEPLASE 2 MG IJ SOLR: 2.0000 mg | Freq: Once | INTRAMUSCULAR | Status: DC | PRN

## 2021-07-27 MED ORDER — POLYETHYLENE GLYCOL 3350 17 G PO PACK: 17.0000 g | PACK | Freq: Every day | ORAL | Status: DC | PRN

## 2021-07-27 MED ORDER — LIDOCAINE-PRILOCAINE 2.5-2.5 % EX CREA: 1.0000 "application " | TOPICAL_CREAM | CUTANEOUS | Status: DC | PRN

## 2021-07-27 NOTE — Progress Notes (Signed)
PROGRESS NOTE  Melissa Adams  DOB: 1946-05-16  PCP: [name], NP ZOX:[id number]  DOA: 07/17/2021  LOS: 8 days  Hospital Day: 88   Chief complaint: Back pain  Brief narrative: Melissa Adams is a 75 y.o. 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 Patient follows up with oncologist Dr. Lorenso Courier.  She had an MRI of the abdomen under anesthesia done on 06/19/2021 which showed innumerable enhancing lesions throughout both lobes of liver concerning for metastatic disease. 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   On 8/15, she underwent a liver biopsy that showed adenocarcinoma of unknown primary. She was in excruciating pain in her lower back over the past few weeks because of which she has not been able to walk on her own. On 8/23, patient went to pulmonologist Dr. August Albino office for follow-up.  Because of the severity of her pain, she was sent to the ED for inpatient management. Chest x-ray showed right middle lobe consolidation with moderate right pleural effusion. Patient was admitted to hospitalist service for further evaluation management. See below for details. Currently pending placement.  Subjective: Patient was seen and examined this morning in dialysis unit.  Not in distress.  Pending SNF placement.   Assessment/Plan: Intractable right-sided lower back pain Pathological fracture of L4 vertebrae due to metastatic disease -8/26, MRI of lumbar spine showed severe spinal canal stenosis of L4-L5 region and bilateral neuroforaminal narrowing secondary to combination of pathological compression fracture and severe degenerative changes.  Also has  multilevel moderate to severe neural foraminal narrowing in the lumbar region.  -Oncology, IR and neurosurgery consulted.  Patient is a poor candidate for neurosurgical intervention. -8/30, patient underwent vertebroplasty by IR.  -At this time, pain is controlled with PRN oral oxycodone as needed. -PT eval was obtained.  SNF recommended.  In process  Metastatic adenocarcinoma of unknown primary -Shown on liver biopsy from 8/15. -Unable to go for previously planned outpatient breast biopsy because patient is hospitalized.   -8/30, IR did a vertebral biopsy.  Oncologist Dr. Lorenso Courier to discuss with patient's family about report.  -Palliative care consult appreciated. Had a family meeting on 8/27.  Patient and family wanted to continue treatment measures at this time keeping as a DNR/DNI.  Right pleural effusion -8/26, underwent thoracentesis with removal of 1.2 L of pleural fluid.  Respiratory status stable.  ESRD-HD-MWF -Nephrology following -Continue midodrine  Chronic systolic CHF Cardiogenic liver cirrhosis -Last EF 30 to 35%.  Patient was on Toprol-XL as a result high dose of Lasix.  With poor oral intake and intermittent hypotension, Lasix was kept on hold.  Toprol to continue at a lower dose.  A. fib  -Continue Toprol at a lower dose.  Eliquis was held for vertebroplasty.  Resumed postprocedure  Mobility: PT follow-up Code Status:   Code Status: DNR  Nutritional status: Body mass index is 36.4 kg/m.     Diet:  Diet Order             Diet renal with fluid restriction Fluid restriction: 1200 mL Fluid; Room service appropriate? Yes; Fluid consistency: Thin  Diet effective now  DVT prophylaxis:   apixaban (ELIQUIS) tablet 5 mg   Antimicrobials: None Fluid: None Consultants: Oncology, IR Family Communication: Daughter updated intermittently.  Status is: Inpatient  Remains inpatient appropriate because: Pending SNF at this time  Dispo: The  patient is from: Home              Anticipated d/c is to:  Pending SNF              Patient currently is medically stable to d/c.   Difficult to place patient No     Infusions:   sodium chloride     sodium chloride       Scheduled Meds:  (feeding supplement) PROSource Plus  30 mL Oral BID BM   allopurinol  300 mg Oral QHS   apixaban  5 mg Oral BID   atorvastatin  80 mg Oral QPM   Chlorhexidine Gluconate Cloth  6 each Topical Q0600   cinacalcet  30 mg Oral Q supper   doxercalciferol  2 mcg Intravenous Q M,W,F-HD   ferric citrate  420 mg Oral TID WC   ferrous sulfate  325 mg Oral Daily   gabapentin  100 mg Oral TID   metoprolol tartrate  12.5 mg Oral BID   midodrine  10 mg Oral Daily   multivitamin  1 tablet Oral QHS   senna-docusate  1 tablet Oral BID   vitamin B-12  1,000 mcg Oral Daily    Antimicrobials: Anti-infectives (From admission, onward)    Start     Dose/Rate Route Frequency Ordered Stop   07/23/21 0000  ceFAZolin (ANCEF) IVPB 2g/100 mL premix        2 g 200 mL/hr over 30 Minutes Intravenous On call 07/22/21 1422 07/23/21 1410       PRN meds: sodium chloride, sodium chloride, acetaminophen **OR** acetaminophen, albuterol, alteplase, heparin, iohexol, lidocaine (PF), lidocaine-prilocaine, LORazepam, ondansetron **OR** ondansetron (ZOFRAN) IV, oxyCODONE, pentafluoroprop-tetrafluoroeth   Objective: Vitals:   07/27/21 1030 07/27/21 1100  BP: 104/67 (!) 124/55  Pulse: 87 77  Resp:    Temp:    SpO2:     No intake or output data in the 24 hours ending 07/27/21 1154  Filed Weights   07/25/21 0748 07/25/21 1128 07/27/21 0828  Weight: 93.2 kg 91.5 kg 93.2 kg   Weight change:  Body mass index is 36.4 kg/m.   Physical Exam: General exam: Pleasant, elderly African-American female.  Pain controlled.  Not in distress Skin: No rashes, lesions or ulcers. HEENT: Atraumatic, normocephalic, no obvious bleeding Lungs: Clear to auscultation bilaterally CVS:  Regular rate and rhythm, no murmur GI/Abd soft, nontender, nondistended, bowel sound present CNS: Alert, awake, oriented x3. psychiatry: Depressed look Extremities: No pedal edema, no calf tenderness  Data Review: I have personally reviewed the laboratory data and studies available.  Recent Labs  Lab 07/23/21 0442 07/24/21 0427 07/25/21 0244 07/26/21 0505 07/27/21 0242  WBC 8.2 7.1 6.7 7.5 7.8  HGB 11.0* 9.9* 9.6* 9.6* 9.2*  HCT 37.3 33.1* 30.9* 31.1* 30.4*  MCV 101.1* 100.3* 98.1 99.0 99.0  PLT 115* 97* 118* 119* 130*    Recent Labs  Lab 07/20/21 1700 07/23/21 0723 07/24/21 0427 07/25/21 0244 07/26/21 0505  NA  --  130* 130* 128* 132*  K  --  5.1 4.1 4.4 3.7  CL  --  95* 94* 91* 94*  CO2  --  24 25 26 26   GLUCOSE  --  103* 89 93 95  BUN  --  67*  39* 56* 28*  CREATININE  --  9.11* 6.50* 7.50* 5.24*  CALCIUM  --  8.1* 7.6* 7.9* 8.1*  PHOS 4.8* 5.8*  --   --   --      F/u labs ordered Unresulted Labs (From admission, onward)     Start     Ordered   07/27/21 0641  Renal function panel  Once,   R       Question:  Specimen collection method  Answer:  Lab=Lab collect   07/27/21 0640   07/21/21 0500  CBC  Daily,   R     Question:  Specimen collection method  Answer:  Lab=Lab collect   07/20/21 1840   Signed and Held  Renal function panel  Once,   R       Question:  Specimen collection method  Answer:  Lab=Lab collect   Signed and Held   Signed and Held  CBC  Once,   R       Question:  Specimen collection method  Answer:  Lab=Lab collect   Signed and Held   Signed and Held  CBC  Once,   R       Question:  Specimen collection method  Answer:  Lab=Lab collect   Signed and Held            Signed, Terrilee Croak, MD [facility] 07/27/2021

## 2021-07-27 NOTE — Progress Notes (Signed)
OT Cancellation Note  Patient Details Name: Melissa Adams MRN: [id number] DOB: Oct 09, 1946   Cancelled Treatment:    Reason Eval/Treat Not Completed: Patient at procedure or test/ unavailable (HD currently)  Jeri Modena 07/27/2021, 8:56 AM  Fleeta Emmer, OTR/L  Acute Rehabilitation Services Pager: [phone] Office: [phone] .

## 2021-07-27 NOTE — TOC Progression Note (Signed)
Transition of Care [facility]) - Progression Note    Patient Details  Name: Melissa Adams MRN: [id number] Date of Birth: 11-23-1946  Transition of Care [facility]) CM/SW [place], [place] Phone Number: 07/27/2021, 1:32 PM  Clinical Narrative:    CSW followed up with pt's daughter and gave bed offers. She stated she would review them and get back to CSW. She stated they were still waiting on pt's biopsy results, CSW alerted medical team. She also stated that Palliative had left a card for her, but she hasn't heard from them. Palliative confirmed it was their chaplain who was attempting to do POA paperwork, and Palliative is not following in person. TOC will continue to follow for DC needs.   Expected Discharge Plan: [place] Barriers to Discharge: Continued Medical Work up, Ship broker  Expected Discharge Plan and Services Expected Discharge Plan: [place]       Living arrangements for the past 2 months: Apartment                                       Social Determinants of Health (SDOH) Interventions    Readmission Risk Interventions Readmission Risk Prevention Plan 06/14/2020  Transportation Screening Complete  Medication Review Press photographer) Complete  PCP or Specialist appointment within 3-5 days of discharge Complete  HRI or [place] Complete  SW Recovery Care/Counseling Consult Complete  [place] Not Applicable  Some recent data might be hidden

## 2021-07-27 NOTE — Progress Notes (Signed)
Called Melissa Adams' daughter to discuss the results of the pathology.  Findings are consistent with carcinoma.  The findings are very nonspecific, though I do still have strong suspicion that this represents metastatic breast cancer given the lung seen on the original CT imaging.  Patient has had considerable clinical decline since we first met her.  I was directed to the patient's daughter that I do not believe she will be a candidate for chemotherapy moving forward due to her deconditioning, congestive heart failure, cirrhosis, and ESRD.  I do believe that a palliative comfort based approach is most appropriate at this time.  The patient's daughter voiced her understanding of these recommendations.  We will continue to be involved with patient's care though at this time I do not believe that she is a chemotherapy candidate.  Melissa Peoples, MD Department of Hematology/Oncology [facility] Phone: [phone] Pager: [phone] Email: [email or web address]

## 2021-07-27 NOTE — Plan of Care (Signed)
  Problem: Clinical Measurements: Goal: Ability to maintain clinical measurements within normal limits will improve Outcome: Progressing Goal: Will remain free from infection Outcome: Progressing   Problem: Pain Managment: Goal: General experience of comfort will improve Outcome: Progressing   Problem: Safety: Goal: Ability to remain free from injury will improve Outcome: Progressing   Problem: Skin Integrity: Goal: Risk for impaired skin integrity will decrease Outcome: Progressing   

## 2021-07-27 NOTE — Progress Notes (Signed)
[facility] ROUNDING NOTE   Subjective:   Interval History: This is a 75 year old lady end-stage renal disease Monday Wednesday Friday dialysis.  Last dialysis treatment was 07/19/2021.  Next dialysis treatment will be 07/23/2021.  She was admitted with intractable back pain pathological fracture L4 due to metastatic carcinoma.  She has metastatic disease to the liver and spine with questionable breast primary source.  She is currently being followed by palliative medicine.  She has a history of cardiomyopathy with ejection fraction 30 to 35% and atrial fibrillation which is rate controlled.  She is not requiring any ESA due to the history of cancer   Blood pressure 94/56 pulse 90 temperature 98.3 O2 sats 98% 4 L nasal cannula  Last dialysis 07/25/2021 Next dialysis 07/27/2021 1.5 L removed   Sodium 132 potassium 3.7 chloride 94 CO2 26 BUN 28 creatinine 5.24 glucose 95 hemoglobin 9.2      Dialysis Orders: adm farm,MWF, 4hr Edw 88.5,2k, 3ca bath uf2 Hec 2 No Hep Venofer 50  No ESA 2/2 CA    Objective:  Vital signs in last 24 hours:  Temp:  [98.3 F (36.8 C)-98.8 F (37.1 C)] 98.3 F (36.8 C) (09/02 0318) Pulse Rate:  [85-96] 85 (09/01 1540) Resp:  [16-20] 16 (09/02 0318) BP: (91-108)/(52-86) 108/71 (09/02 0318) SpO2:  [96 %-100 %] 96 % (09/02 0318)  Weight change:  Filed Weights   07/23/21 0900 07/25/21 0748 07/25/21 1128  Weight: 94.9 kg 93.2 kg 91.5 kg    Intake/Output: I/O last 3 completed shifts: In: 120 [P.O.:120] Out: 2 [Stool:2]   Intake/Output this shift:  No intake/output data recorded.  CVS- RRR RS- CTAGeneral:well appearing female in NAD Heart:RRR, no mrg Lungs: breath sounds decreased, nml WOB on 2L via [place] Abdomen:soft, NTND Extremities:trace LE edema Dialysis Access: LU AVF +b/t  ABD- BS present soft non-distended EXT- no edema   Basic Metabolic Panel: Recent Labs  Lab 07/20/21 0914 07/20/21 1700 07/23/21 0723 07/23/21 0723  07/24/21 0427 07/25/21 0244 07/26/21 0505  NA 140  --  130*  --  130* 128* 132*  K 3.8  --  5.1  --  4.1 4.4 3.7  CL 100  --  95*  --  94* 91* 94*  CO2  --   --  24  --  25 26 26   GLUCOSE 86  --  103*  --  89 93 95  BUN 31*  --  67*  --  39* 56* 28*  CREATININE 4.80*  --  9.11*  --  6.50* 7.50* 5.24*  CALCIUM  --   --  8.1*   < > 7.6* 7.9* 8.1*  PHOS  --  4.8* 5.8*  --   --   --   --    < > = values in this interval not displayed.     Liver Function Tests: Recent Labs  Lab 07/23/21 0723  ALBUMIN 3.0*    No results for input(s): LIPASE, AMYLASE in the last 168 hours. No results for input(s): AMMONIA in the last 168 hours.  CBC: Recent Labs  Lab 07/23/21 0442 07/24/21 0427 07/25/21 0244 07/26/21 0505 07/27/21 0242  WBC 8.2 7.1 6.7 7.5 7.8  HGB 11.0* 9.9* 9.6* 9.6* 9.2*  HCT 37.3 33.1* 30.9* 31.1* 30.4*  MCV 101.1* 100.3* 98.1 99.0 99.0  PLT 115* 97* 118* 119* 130*     Cardiac Enzymes: No results for input(s): CKTOTAL, CKMB, CKMBINDEX, TROPONINI in the last 168 hours.  BNP: Invalid input(s): POCBNP  CBG: Recent Labs  Lab 07/26/21 [facility]     Microbiology: Results for orders placed or performed during the hospital encounter of 07/17/21  Resp Panel by RT-PCR (Flu A&B, Covid) Nasopharyngeal Swab     Status: None   Collection Time: 07/17/21  7:12 PM   Specimen: Nasopharyngeal Swab; Nasopharyngeal(NP) swabs in vial transport medium  Result Value Ref Range Status   SARS Coronavirus 2 by RT PCR NEGATIVE NEGATIVE Final    Comment: (NOTE) SARS-CoV-2 target nucleic acids are NOT DETECTED.  The SARS-CoV-2 RNA is generally detectable in upper respiratory specimens during the acute phase of infection. The lowest concentration of SARS-CoV-2 viral copies this assay can detect is 138 copies/mL. A negative result does not preclude SARS-Cov-2 infection and should not be used as the sole basis for treatment or other patient management decisions. A negative  result may occur with  improper specimen collection/handling, submission of specimen other than nasopharyngeal swab, presence of viral mutation(s) within the areas targeted by this assay, and inadequate number of viral copies(<138 copies/mL). A negative result must be combined with clinical observations, patient history, and epidemiological information. The expected result is Negative.  Fact Sheet for Patients:  EntrepreneurPulse.com.au  Fact Sheet for Healthcare Providers:  IncredibleEmployment.be  This test is no t yet approved or cleared by the [place] FDA and  has been authorized for detection and/or diagnosis of SARS-CoV-2 by FDA under an Emergency Use Authorization (EUA). This EUA will remain  in effect (meaning this test can be used) for the duration of the COVID-19 declaration under Section 564(b)(1) of the Act, 21 U.S.C.section 360bbb-3(b)(1), unless the authorization is terminated  or revoked sooner.       Influenza A by PCR NEGATIVE NEGATIVE Final   Influenza B by PCR NEGATIVE NEGATIVE Final    Comment: (NOTE) The Xpert Xpress SARS-CoV-2/FLU/RSV plus assay is intended as an aid in the diagnosis of influenza from Nasopharyngeal swab specimens and should not be used as a sole basis for treatment. Nasal washings and aspirates are unacceptable for Xpert Xpress SARS-CoV-2/FLU/RSV testing.  Fact Sheet for Patients: EntrepreneurPulse.com.au  Fact Sheet for Healthcare Providers: IncredibleEmployment.be  This test is not yet approved or cleared by the [place] FDA and has been authorized for detection and/or diagnosis of SARS-CoV-2 by FDA under an Emergency Use Authorization (EUA). This EUA will remain in effect (meaning this test can be used) for the duration of the COVID-19 declaration under Section 564(b)(1) of the Act, 21 U.S.C. section 360bbb-3(b)(1), unless the authorization is  terminated or revoked.  Performed at [facility], [place] [address], [place], [place] [address]   MRSA Next Gen by PCR, Nasal     Status: None   Collection Time: 07/18/21 11:23 PM   Specimen: Nasal Mucosa; Nasal Swab  Result Value Ref Range Status   MRSA by PCR Next Gen NOT DETECTED NOT DETECTED Final    Comment: (NOTE) The GeneXpert MRSA Assay (FDA approved for NASAL specimens only), is one component of a comprehensive MRSA colonization surveillance program. It is not intended to diagnose MRSA infection nor to guide or monitor treatment for MRSA infections. Test performance is not FDA approved in patients less than 4 years old. Performed at [facility], [place] [address], [place], [place] [address]     Coagulation Studies: No results for input(s): LABPROT, INR in the last 72 hours.  Urinalysis: No results for input(s): COLORURINE, LABSPEC, PHURINE, GLUCOSEU, HGBUR, BILIRUBINUR, KETONESUR, PROTEINUR, UROBILINOGEN, NITRITE,  LEUKOCYTESUR in the last 72 hours.  Invalid input(s): APPERANCEUR    Imaging: No results found.   Medications:    sodium chloride     sodium chloride       (feeding supplement) PROSource Plus  30 mL Oral BID BM   allopurinol  300 mg Oral QHS   apixaban  5 mg Oral BID   atorvastatin  80 mg Oral QPM   Chlorhexidine Gluconate Cloth  6 each Topical Q0600   cinacalcet  30 mg Oral Q supper   doxercalciferol  2 mcg Intravenous Q M,W,F-HD   ferric citrate  420 mg Oral TID WC   ferrous sulfate  325 mg Oral Daily   gabapentin  100 mg Oral TID   metoprolol tartrate  12.5 mg Oral BID   midodrine  10 mg Oral Daily   multivitamin  1 tablet Oral QHS   senna-docusate  1 tablet Oral BID   vitamin B-12  1,000 mcg Oral Daily   sodium chloride, sodium chloride, acetaminophen **OR** acetaminophen, albuterol, alteplase, heparin, iohexol, lidocaine (PF), lidocaine-prilocaine, LORazepam, ondansetron **OR** ondansetron (ZOFRAN) IV, oxyCODONE,  pentafluoroprop-tetrafluoroeth  Assessment/ Plan:  Intractable Back Pain with pathologic fracture of L4 due to metastatic carcinoma.  MRI spine with severe degenerative changes of lumbar spine, lesion likely representing metastatic disease, +L4-L5 compression fracture. pain meds/management per admit/oncology ESRD -  HD MWF, off schedule last week due to staffing issues and increased patient census.  Next dialysis 07/27/2021 R pleural effusion - s/p IR paracentesis 8/26 with 1.2L yield clear amber fluids. CXR with improvement in pleural effusion.  Hypertension/volume - BP well controlled with lopressor 12.5mg  BID and midodrine 10mg  daily.  Does not appear grossly volume overloaded. UF as tolerated.  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 Lorenso Courier oncology noted notes. Palliative care consulted, patient now DNAR/DNI.  Family meeting yesterday.  Now DNI/DNAR.  Appreciate palliative assistance.  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     LOS: [place] @TODAY @7 :50 AM

## 2021-07-28 DIAGNOSIS — R188 Other ascites: Secondary | ICD-10-CM | POA: Diagnosis not present

## 2021-07-28 DIAGNOSIS — K7460 Unspecified cirrhosis of liver: Secondary | ICD-10-CM | POA: Diagnosis not present

## 2021-07-28 DIAGNOSIS — N186 End stage renal disease: Secondary | ICD-10-CM | POA: Diagnosis not present

## 2021-07-28 DIAGNOSIS — M4856XA Collapsed vertebra, not elsewhere classified, lumbar region, initial encounter for fracture: Secondary | ICD-10-CM | POA: Diagnosis not present

## 2021-07-28 LAB — CBC
HCT: 33.1 % — ABNORMAL LOW (ref 36.0–46.0)
Hemoglobin: 10 g/dL — ABNORMAL LOW (ref 12.0–15.0)
MCH: 30.3 pg (ref 26.0–34.0)
MCHC: 30.2 g/dL (ref 30.0–36.0)
MCV: 100.3 fL — ABNORMAL HIGH (ref 80.0–100.0)
Platelets: 138 10*3/uL — ABNORMAL LOW (ref 150–400)
RBC: 3.3 MIL/uL — ABNORMAL LOW (ref 3.87–5.11)
RDW: 17.4 % — ABNORMAL HIGH (ref 11.5–15.5)
WBC: 7.5 10*3/uL (ref 4.0–10.5)
nRBC: 0 % (ref 0.0–0.2)

## 2021-07-28 NOTE — Progress Notes (Signed)
[facility] ROUNDING NOTE   Subjective:   Interval History: This is a 75 year old lady end-stage renal disease Monday Wednesday Friday dialysis.  Last dialysis treatment was 07/19/2021.  Next dialysis treatment will be 07/23/2021.  She was admitted with intractable back pain pathological fracture L4 due to metastatic carcinoma.  She has metastatic disease to the liver and spine with questionable breast primary source.  She is currently being followed by palliative medicine.  She has a history of cardiomyopathy with ejection fraction 30 to 35% and atrial fibrillation which is rate controlled.  She is not requiring any ESA due to the history of cancer   Blood pressure 99/51 pulse 80 temperature 97.6 O2 sats 99% 2 L nasal cannula  Last dialysis 07/27/2021 1 L removed  Next dialysis to be 07/30/2021.   Sodium 132 potassium 3.7 chloride 94 CO2 26 BUN 28 creatinine 5.24 glucose 95 hemoglobin 10      Dialysis Orders: adm farm,MWF, 4hr Edw 88.5,2k, 3ca bath uf2 Hec 2 No Hep Venofer 50  No ESA 2/2 CA    Objective:  Vital signs in last 24 hours:  Temp:  [97.6 F (36.4 C)-99.7 F (37.6 C)] 97.6 F (36.4 C) (09/03 0800) Pulse Rate:  [70-93] 79 (09/03 0500) Resp:  [16-18] 18 (09/03 0800) BP: (86-124)/(43-69) 96/47 (09/03 0800) SpO2:  [98 %-100 %] 98 % (09/03 0800) Weight:  [91.4 kg-92.2 kg] 91.4 kg (09/03 0400)  Weight change:  Filed Weights   07/27/21 0828 07/27/21 1211 07/28/21 0400  Weight: 93.2 kg 92.2 kg 91.4 kg    Intake/Output: I/O last 3 completed shifts: In: 236 [P.O.:236] Out: 1000 [Other:1000]   Intake/Output this shift:  No intake/output data recorded.  CVS- RRR RS- CTAGeneral:well appearing female in NAD Heart:RRR, no mrg Lungs: breath sounds decreased, nml WOB on 2L via [place] Abdomen:soft, NTND Extremities:trace LE edema Dialysis Access: LU AVF +b/t  ABD- BS present soft non-distended EXT- no edema   Basic Metabolic Panel: Recent Labs  Lab  07/23/21 0723 07/24/21 0427 07/25/21 0244 07/26/21 0505  NA 130* 130* 128* 132*  K 5.1 4.1 4.4 3.7  CL 95* 94* 91* 94*  CO2 24 25 26 26   GLUCOSE 103* 89 93 95  BUN 67* 39* 56* 28*  CREATININE 9.11* 6.50* 7.50* 5.24*  CALCIUM 8.1* 7.6* 7.9* 8.1*  PHOS 5.8*  --   --   --      Liver Function Tests: Recent Labs  Lab 07/23/21 0723  ALBUMIN 3.0*    No results for input(s): LIPASE, AMYLASE in the last 168 hours. No results for input(s): AMMONIA in the last 168 hours.  CBC: Recent Labs  Lab 07/24/21 0427 07/25/21 0244 07/26/21 0505 07/27/21 0242 07/28/21 0016  WBC 7.1 6.7 7.5 7.8 7.5  HGB 9.9* 9.6* 9.6* 9.2* 10.0*  HCT 33.1* 30.9* 31.1* 30.4* 33.1*  MCV 100.3* 98.1 99.0 99.0 100.3*  PLT 97* 118* 119* 130* 138*     Cardiac Enzymes: No results for input(s): CKTOTAL, CKMB, CKMBINDEX, TROPONINI in the last 168 hours.  BNP: Invalid input(s): POCBNP  CBG: Recent Labs  Lab 07/26/21 [place]*     Microbiology: Results for orders placed or performed during the hospital encounter of 07/17/21  Resp Panel by RT-PCR (Flu A&B, Covid) Nasopharyngeal Swab     Status: None   Collection Time: 07/17/21  7:12 PM   Specimen: Nasopharyngeal Swab; Nasopharyngeal(NP) swabs in vial transport medium  Result Value Ref Range Status   SARS Coronavirus 2  by RT PCR NEGATIVE NEGATIVE Final    Comment: (NOTE) SARS-CoV-2 target nucleic acids are NOT DETECTED.  The SARS-CoV-2 RNA is generally detectable in upper respiratory specimens during the acute phase of infection. The lowest concentration of SARS-CoV-2 viral copies this assay can detect is 138 copies/mL. A negative result does not preclude SARS-Cov-2 infection and should not be used as the sole basis for treatment or other patient management decisions. A negative result may occur with  improper specimen collection/handling, submission of specimen other than nasopharyngeal swab, presence of viral mutation(s) within  the areas targeted by this assay, and inadequate number of viral copies(<138 copies/mL). A negative result must be combined with clinical observations, patient history, and epidemiological information. The expected result is Negative.  Fact Sheet for Patients:  EntrepreneurPulse.com.au  Fact Sheet for Healthcare Providers:  IncredibleEmployment.be  This test is no t yet approved or cleared by the [place] FDA and  has been authorized for detection and/or diagnosis of SARS-CoV-2 by FDA under an Emergency Use Authorization (EUA). This EUA will remain  in effect (meaning this test can be used) for the duration of the COVID-19 declaration under Section 564(b)(1) of the Act, 21 U.S.C.section 360bbb-3(b)(1), unless the authorization is terminated  or revoked sooner.       Influenza A by PCR NEGATIVE NEGATIVE Final   Influenza B by PCR NEGATIVE NEGATIVE Final    Comment: (NOTE) The Xpert Xpress SARS-CoV-2/FLU/RSV plus assay is intended as an aid in the diagnosis of influenza from Nasopharyngeal swab specimens and should not be used as a sole basis for treatment. Nasal washings and aspirates are unacceptable for Xpert Xpress SARS-CoV-2/FLU/RSV testing.  Fact Sheet for Patients: EntrepreneurPulse.com.au  Fact Sheet for Healthcare Providers: IncredibleEmployment.be  This test is not yet approved or cleared by the [place] FDA and has been authorized for detection and/or diagnosis of SARS-CoV-2 by FDA under an Emergency Use Authorization (EUA). This EUA will remain in effect (meaning this test can be used) for the duration of the COVID-19 declaration under Section 564(b)(1) of the Act, 21 U.S.C. section 360bbb-3(b)(1), unless the authorization is terminated or revoked.  Performed at [facility], [place] [address], [place], [place] [address]   MRSA Next Gen by PCR, Nasal     Status: None    Collection Time: 07/18/21 11:23 PM   Specimen: Nasal Mucosa; Nasal Swab  Result Value Ref Range Status   MRSA by PCR Next Gen NOT DETECTED NOT DETECTED Final    Comment: (NOTE) The GeneXpert MRSA Assay (FDA approved for NASAL specimens only), is one component of a comprehensive MRSA colonization surveillance program. It is not intended to diagnose MRSA infection nor to guide or monitor treatment for MRSA infections. Test performance is not FDA approved in patients less than 3 years old. Performed at [facility], [place] [address], [place], [place] [address]     Coagulation Studies: No results for input(s): LABPROT, INR in the last 72 hours.  Urinalysis: No results for input(s): COLORURINE, LABSPEC, PHURINE, GLUCOSEU, HGBUR, BILIRUBINUR, KETONESUR, PROTEINUR, UROBILINOGEN, NITRITE, LEUKOCYTESUR in the last 72 hours.  Invalid input(s): APPERANCEUR    Imaging: No results found.   Medications:       (feeding supplement) PROSource Plus  30 mL Oral BID BM   allopurinol  300 mg Oral QHS   apixaban  5 mg Oral BID   atorvastatin  80 mg Oral QPM   Chlorhexidine Gluconate Cloth  6 each Topical Q0600   cinacalcet  30 mg Oral Q supper   doxercalciferol  2 mcg Intravenous Q M,W,F-HD   ferric citrate  420 mg Oral TID WC   ferrous sulfate  325 mg Oral Daily   gabapentin  100 mg Oral TID   metoprolol tartrate  12.5 mg Oral BID   midodrine  10 mg Oral Daily   multivitamin  1 tablet Oral QHS   senna-docusate  1 tablet Oral BID   vitamin B-12  1,000 mcg Oral Daily   acetaminophen **OR** acetaminophen, albuterol, iohexol, LORazepam, ondansetron **OR** ondansetron (ZOFRAN) IV, oxyCODONE, polyethylene glycol  Assessment/ Plan:  Intractable Back Pain with pathologic fracture of L4 due to metastatic carcinoma.  MRI spine with severe degenerative changes of lumbar spine, lesion likely representing metastatic disease, +L4-L5 compression fracture. pain meds/management per  admit/oncology ESRD -  HD MWF,  Next dialysis will be 07/30/2021 R pleural effusion - s/p IR paracentesis 8/26 with 1.2L yield clear amber fluids. CXR with improvement in pleural effusion.  Hypertension/volume - BP well controlled with lopressor 12.5mg  BID and midodrine 10mg  daily.  Does not appear grossly volume overloaded. UF as tolerated.  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 Lorenso Courier oncology noted notes. Palliative care consulted, patient now DNAR/DNI.  Family meeting yesterday.  Now DNI/DNAR.  Appreciate palliative assistance.  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  Continue Hectorol 66mcg, Sensipar, auryxia Cardiomyopathy w/EF 30-35% Atrial fib on lopressor and Eliquis.     LOS: [place] @TODAY @8 :55 AM

## 2021-07-28 NOTE — Progress Notes (Signed)
PROGRESS NOTE  Melissa Adams  DOB: 07-19-1946  PCP: [name], NP CWC:[id number]  DOA: 07/17/2021  LOS: 9 days  Hospital Day: 12   Chief complaint: Back pain  Brief narrative: Melissa Adams is a 75 y.o. 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 Patient follows up with oncologist Dr. Lorenso Courier.  She had an MRI of the abdomen under anesthesia done on 06/19/2021 which showed innumerable enhancing lesions throughout both lobes of liver concerning for metastatic disease. 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   On 8/15, she underwent a liver biopsy that showed adenocarcinoma of unknown primary. She was in excruciating pain in her lower back over the past few weeks because of which she has not been able to walk on her own. On 8/23, patient went to pulmonologist Dr. August Albino office for follow-up.  Because of the severity of her pain, she was sent to the ED for inpatient management. Chest x-ray showed right middle lobe consolidation with moderate right pleural effusion. Patient was admitted to hospitalist service for further evaluation management. See below for details. Currently pending placement.  07/28/2021: Patient seen.  Also discussed extensively with the patient's nurse.  Input from the oncology team is highly appreciated.  According to the oncology team, patient is not a candidate for chemotherapy.  Back pain has improved significantly.  Subjective: No new complaints. Back pain is controlled.  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  Also has multilevel moderate to severe neural foraminal narrowing in the lumbar region.  -Oncology, IR and neurosurgery consulted.  Patient is a poor candidate for neurosurgical intervention. -8/30, patient underwent vertebroplasty by IR.  -At this time, pain is controlled with PRN oral oxycodone as needed. -PT eval was obtained.  SNF recommended.  In process 07/28/2021: Back pain is controlled.  Metastatic adenocarcinoma of unknown primary -Shown on liver biopsy from 8/15. -Unable to go for previously planned outpatient breast biopsy because patient is hospitalized.   -8/30, IR did a vertebral biopsy.  Oncologist Dr. Lorenso Courier to discuss with patient's family about report.  -Palliative care consult appreciated. Had a family meeting on 8/27.  Patient and family wanted to continue treatment measures at this time keeping as a DNR/DNI. 07/28/2021: Oncology input is appreciated.  As per oncology team, patient is not a candidate for chemotherapy.    Right pleural effusion -8/26, underwent thoracentesis with removal of 1.2 L of pleural fluid.  Respiratory status stable.  ESRD-HD-MWF -Nephrology following -Continue midodrine  Chronic systolic CHF Cardiogenic liver cirrhosis -Last EF 30 to 35%.  Patient was on Toprol-XL as a result high dose of Lasix.  With poor oral intake and intermittent hypotension, Lasix was kept on hold.  Toprol to continue at a lower dose.  A. fib  -Continue Toprol at a lower dose.  Eliquis was held for vertebroplasty.  Resumed postprocedure  Mobility: PT follow-up Code Status:   Code Status: DNR  Nutritional status: Body mass index is 35.69 kg/m.     Diet:  Diet Order             Diet renal with fluid restriction Fluid restriction: 1200 mL Fluid; Room service appropriate? Yes; Fluid  consistency: Thin  Diet effective now                  DVT prophylaxis:   apixaban (ELIQUIS) tablet 5 mg   Antimicrobials: None Fluid: None Consultants: Oncology, IR Family Communication: Daughter updated intermittently.  Status is: Inpatient  Remains inpatient appropriate because: Pending SNF at this time  Dispo: The patient is from: Home              Anticipated d/c is to:  Pending SNF              Patient currently is medically stable to d/c.   Difficult to place patient No     Infusions:      Scheduled Meds:  (feeding supplement) PROSource Plus  30 mL Oral BID BM   allopurinol  300 mg Oral QHS   apixaban  5 mg Oral BID   atorvastatin  80 mg Oral QPM   Chlorhexidine Gluconate Cloth  6 each Topical Q0600   cinacalcet  30 mg Oral Q supper   doxercalciferol  2 mcg Intravenous Q M,W,F-HD   ferric citrate  420 mg Oral TID WC   ferrous sulfate  325 mg Oral Daily   gabapentin  100 mg Oral TID   metoprolol tartrate  12.5 mg Oral BID   midodrine  10 mg Oral Daily   multivitamin  1 tablet Oral QHS   senna-docusate  1 tablet Oral BID   vitamin B-12  1,000 mcg Oral Daily    Antimicrobials: Anti-infectives (From admission, onward)    Start     Dose/Rate Route Frequency Ordered Stop   07/23/21 0000  ceFAZolin (ANCEF) IVPB 2g/100 mL premix        2 g 200 mL/hr over 30 Minutes Intravenous On call 07/22/21 1422 07/23/21 1410       PRN meds: acetaminophen **OR** acetaminophen, albuterol, iohexol, LORazepam, ondansetron **OR** ondansetron (ZOFRAN) IV, oxyCODONE, polyethylene glycol   Objective: Vitals:   07/28/21 1147 07/28/21 1550  BP: 117/78 (!) 97/44  Pulse: 80 87  Resp:  18  Temp: 98 F (36.7 C) (!) 97.4 F (36.3 C)  SpO2:  100%    Intake/Output Summary (Last 24 hours) at 07/28/2021 1845 Last data filed at 07/28/2021 0800 Gross per 24 hour  Intake 0 ml  Output --  Net 0 ml    Filed Weights   07/27/21 0828 07/27/21 1211 07/28/21 0400  Weight: 93.2  kg 92.2 kg 91.4 kg   Weight change:  Body mass index is 35.69 kg/m.   Physical Exam: General exam: Pleasant, elderly African-American female.  Pain controlled.  Not in distress HEENT: Patient is pale.  No jaundice.   Lungs: Clear to auscultation bilaterally CVS: S1-S2. GI/Abd soft, nontender, bowel sound present CNS: Alert, awake, oriented x3. Extremities: No pedal edema, no calf tenderness  Data Review: I have personally reviewed the laboratory data and studies available.  Recent Labs  Lab 07/24/21 0427 07/25/21 0244 07/26/21 0505 07/27/21 0242 07/28/21 0016  WBC 7.1 6.7 7.5 7.8 7.5  HGB 9.9* 9.6* 9.6* 9.2* 10.0*  HCT 33.1* 30.9* 31.1* 30.4* 33.1*  MCV 100.3* 98.1 99.0 99.0 100.3*  PLT 97* 118* 119* 130* 138*    Recent Labs  Lab 07/23/21 0723 07/24/21 0427 07/25/21 0244 07/26/21 0505  NA 130* 130* 128*  132*  K 5.1 4.1 4.4 3.7  CL 95* 94* 91* 94*  CO2 24 25 26 26   GLUCOSE 103* 89 93 95  BUN 67* 39* 56* 28*  CREATININE 9.11* 6.50* 7.50* 5.24*  CALCIUM 8.1* 7.6* 7.9* 8.1*  PHOS 5.8*  --   --   --      F/u labs ordered Unresulted Labs (From admission, onward)     Start     Ordered   07/21/21 0500  CBC  Daily,   R     Question:  Specimen collection method  Answer:  Lab=Lab collect   07/20/21 1840   Signed and Held  Renal function panel  Once,   R       Question:  Specimen collection method  Answer:  Lab=Lab collect   Signed and Held   Signed and Held  CBC  Once,   R       Question:  Specimen collection method  Answer:  Lab=Lab collect   Signed and Held   Signed and Held  CBC  Once,   R       Question:  Specimen collection method  Answer:  Lab=Lab collect   Signed and Held            Time spent: 25 minutes  Signed, Bonnell Public, MD [facility] 07/28/2021

## 2021-07-29 DIAGNOSIS — I482 Chronic atrial fibrillation, unspecified: Secondary | ICD-10-CM | POA: Diagnosis not present

## 2021-07-29 DIAGNOSIS — K7460 Unspecified cirrhosis of liver: Secondary | ICD-10-CM | POA: Diagnosis not present

## 2021-07-29 DIAGNOSIS — Z515 Encounter for palliative care: Secondary | ICD-10-CM | POA: Diagnosis not present

## 2021-07-29 DIAGNOSIS — I5022 Chronic systolic (congestive) heart failure: Secondary | ICD-10-CM | POA: Diagnosis not present

## 2021-07-29 DIAGNOSIS — Z7189 Other specified counseling: Secondary | ICD-10-CM | POA: Diagnosis not present

## 2021-07-29 DIAGNOSIS — M4856XA Collapsed vertebra, not elsewhere classified, lumbar region, initial encounter for fracture: Secondary | ICD-10-CM | POA: Diagnosis not present

## 2021-07-29 LAB — CBC
HCT: 33.8 % — ABNORMAL LOW (ref 36.0–46.0)
Hemoglobin: 10.2 g/dL — ABNORMAL LOW (ref 12.0–15.0)
MCH: 30.4 pg (ref 26.0–34.0)
MCHC: 30.2 g/dL (ref 30.0–36.0)
MCV: 100.6 fL — ABNORMAL HIGH (ref 80.0–100.0)
Platelets: 131 10*3/uL — ABNORMAL LOW (ref 150–400)
RBC: 3.36 MIL/uL — ABNORMAL LOW (ref 3.87–5.11)
RDW: 17.2 % — ABNORMAL HIGH (ref 11.5–15.5)
WBC: 5.9 10*3/uL (ref 4.0–10.5)
nRBC: 0 % (ref 0.0–0.2)

## 2021-07-29 MED ORDER — CHLORHEXIDINE GLUCONATE CLOTH 2 % EX PADS
6.0000 | MEDICATED_PAD | Freq: Every day | CUTANEOUS | Status: DC
  Administered 2021-07-29 – 2021-07-31 (×3): 6 via TOPICAL

## 2021-07-29 NOTE — TOC Progression Note (Signed)
Transition of Care [facility]) - Progression Note    Patient Details  Name: Melissa Adams MRN: [id number] Date of Birth: 1946-11-09  Transition of [facility] CM/SW Contact  Bartholomew Crews, RN Phone Number: [phone] 07/29/2021, 3:42 PM  Clinical Narrative:  Notified by palliative that daughter was wanting to bring patient home with [facility] and palliatve care. Request for palliative every 2 weeks.   Spoke with patient at the bedside. She attends HD on MWF [facility]. She stated that her son in law provides transportation to/from dialysis.   Advised that CM would follow up with her daughter to discuss transition home - patient agreeable.   TOC following.   Expected Discharge Plan: [place] Barriers to Discharge: Continued Medical Work up  Expected Discharge Plan and Services Expected Discharge Plan: [place] arrangements for the past 2 months: Apartment                                       Social Determinants of Health (SDOH) Interventions    Readmission Risk Interventions Readmission Risk Prevention Plan 06/14/2020  Transportation Screening Complete  Medication Review Press photographer) Complete  PCP or Specialist appointment within 3-5 days of discharge Complete  HRI or [place] Complete  SW Recovery Care/Counseling Consult Complete  [place] Not Applicable  Some recent data might be hidden

## 2021-07-29 NOTE — Progress Notes (Signed)
    Durable Medical Equipment  (From admission, onward)           Start     Ordered   07/29/21 1633  For home use only DME 3 n 1  Once        07/29/21 1635   07/29/21 1631  For home use only DME lightweight manual wheelchair with seat cushion  Once       Comments: Patient suffers from CHF, ESRD, lumbar fracture which impairs their ability to perform daily activities like bathing, dressing, and toileting in the home.  A cane or walker will not resolve  issue with performing activities of daily living. A wheelchair will allow patient to safely perform daily activities. Patient is not able to propel themselves in the home using a standard weight wheelchair due to general weakness. Patient can self propel in the lightweight wheelchair. Length of need Lifetime. Accessories: elevating leg rests (ELRs), wheel locks, extensions and anti-tippers.   07/29/21 1635   07/29/21 1630  For home use only DME Hospital bed  Once       Question Answer Comment  Length of Need Lifetime   Patient has (list medical condition): ESRD - lumbar fractures - CHF   The above medical condition requires: Patient requires the ability to reposition frequently   Head must be elevated greater than: 30 degrees   Bed type Semi-electric   [employer] Yes   Trapeze Bar Yes   Support Surface: Gel Overlay      07/29/21 1635

## 2021-07-29 NOTE — Progress Notes (Addendum)
Palliative Medicine Inpatient Follow Up Note  Consulting Provider: Terrilee Croak, MD   Reason for consult:   [place] Palliative Medicine Consult  Reason for Consult? GOC    HPI:  Per intake H&P --> Melissa Adams is a 75 y.o. 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 As of July 2022 (+) innumerable enhancing lesions throughout both lobes of liver concerning for metastatic disease. 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  Has been experiencing excruciating pain.    Palliative care has been asked to get involved to further address goals of care and symptom management.   Today's Discussion (07/29/2021):  *Please note that this is a verbal dictation therefore any spelling or grammatical errors are due to the "[place] [employer]" system interpretation.  Patients bedside RN, patients daughter has requested to speak to the Palliative Medicine team given patients metastatic cancer and inability to get additional therapies.   Chart reviewed.   I met with Maritta and her daughter, Melissa Adams at bedside. Melissa Adams shares with me that since we last spoke her father in law has passed away. She states that it has been a horrible week and she now understands that her mother is not a candidate for any additional chemotherapy. I provided time and space for her to continue to express the emotion of grief to me.   We reviewed together, Dr. Libby Maw notes as Melissa Adams was not sure she had understood what a "Palliative approach" meant. We discussed that the focus is now to provide symptom management to promote the best quality of lived life that is possible for  [name]. I shared that we will get to a point where hospice is the next step and we reviewed that this would involve stopping hemodialysis which Melissa Adams shares she is not ready to do. Nevertheless, I described hospice as a service for patients for have a life expectancy of < 14month. It preserves dignity and quality at the end phases of life. The focus changes from curative to symptom relief.   Discussions continued regarding optimizing Melissa Adams's conditions as best as possible from a symptom perspective. We reviewed continuing PT/OT for muscular deconditioning. We reviewed a plan for pain management of Melissa Adams's back. We reviewed a plan for nutritional intake.   Melissa Adams expressed to me frustration that PT/OT had told her that her mother was unable to do anything for herself when the nursing technician could get her out of bed by herself. SNyahwas also able to eat a full [employer] of food from home without assistance. I shared that maybe she should then be re-evaluated as her condition has improved. Melissa Adams agreed with this and requested that it does not occur on a dialysis day.  Melissa Adams expressed not want to send her mother to a SNF and [facility] She is in agreement with OP Palliative support. Melissa Adams states that her mother will be on the first level so no longer will need to travel up and down steps as was prior thought to be an obstacle.   Melissa Adams requested that the Radiation oncology date is changed. I shared with her that I do not do this but I will try to support finding the  person who does.   Education on signs of cancer progression reviewed to better help with the eventual decision of hospice care.  Questions and concerns addressed   Objective Assessment: Vital Signs Vitals:   07/29/21 0827 07/29/21 1206  BP: (!) 120/50 111/66  Pulse:    Resp:  19  Temp:  98.5 F (36.9 C)  SpO2:  98%    Intake/Output Summary (Last 24 hours) at 07/29/2021 1442 Last data filed at  07/29/2021 1000 Gross per 24 hour  Intake 120 ml  Output --  Net 120 ml    Last Weight  Most recent update: 07/28/2021  4:01 AM    Weight  91.4 kg (201 lb 8 oz)            Gen:  Elderly AA F in NAD HEENT: moist mucous membranes CV: Regular rate and irregular rhythm PULM: On 2LPM [place] ABD: soft/nontender  EXT: No edema   SUMMARY OF RECOMMENDATIONS   DNAR/DNI   MOST/DNR Form Completed, paper copy placed onto the chart electric copy can be found in [employer]  Advance Directives on chart just need to be notarized - patient declined this in the absence of her daughter  Appreciate TOC helping with HH  Appreciate PT/OT involvement, daughter requested not to work with the patient on HD days  Patient will need OP palliative support ideally 2x a month, patient and her daughter are not yet ready for hospice support   Ongoing PMT support   Code Status/Advance Care Planning: DNAR/DNI   Symptom Management:  Lower Back Pain d/t metastatic disease: - Gabapentin 100mg PO TID - Tylenol 650mg PO Q6H PRN - Oxycodone 5mg PO Q6H PRN  Constipation: - Senna 1 tab PO BID  Muscular Weakness: - PT/OT re-evaluation(s) - OOB to chair daily  Malnutrition: - Dietary consult - Family encouraged to bring food from home   Time Spent: 75 Greater than 50% of the time was spent in counseling and coordination of care ______________________________________________________________________________________ Michelle Ferolito [place] Palliative Medicine Team Team Cell Phone: [phone] Please utilize secure chat with additional questions, if there is no response within 30 minutes please call the above phone number  Palliative Medicine Team providers are available by phone from 7am to 7pm daily and can be reached through the team cell phone.  Should this patient require assistance outside of these hours, please call the patient's attending physician.     

## 2021-07-29 NOTE — TOC Progression Note (Signed)
Transition of Care [facility]) - Progression Note    Patient Details  Name: Melissa Adams MRN: [id number] Date of Birth: 04/06/1946  [facility] CM/SW Contact  Bartholomew Crews, RN Phone Number: [phone] 07/29/2021, 4:44 PM  Clinical Narrative:     Spoke with patient's daughter, [name], on her mobile phone. [name] feels that it is in patient's best interest to have patient at home. Family is able to provide assistance 24/7. [name] or her husband provide dialysis transportation to [facility] on MWF - her current seat time is 6am.   Discussed HH. Offered choice of agency. [facility] able to accept referral for PT, OT, and SW.   Discussed SW to assist with establishing PCS services through [facility].   Patient has a walker and home oxygen at 3L through [place]. [name] requests 3N1, WC, and hospital bed/hoyer lift. Referral to [facility] for delivery to the home.   [name] agreeable to outpatient palliative care. Unsure of frequency for visits. Referral to [facility] for follow up post discharge.   [name] to plan on providing transportation home via private vehicle. Will follow for potential need for nonemergent ambulance transportation.   TOC following for transition needs.    Expected Discharge Plan: [place] Barriers to Discharge: Continued Medical Work up  Expected Discharge Plan and Services Expected Discharge Plan: [place] arrangements for the past 2 months: Apartment                                       Social Determinants of Health (SDOH) Interventions    Readmission Risk Interventions Readmission Risk Prevention Plan 06/14/2020  Transportation Screening Complete  Medication Review Press photographer) Complete  PCP or Specialist appointment within 3-5 days of discharge Complete  HRI or [place] Complete  SW Recovery Care/Counseling Consult Complete  [place] Not Applicable  Some recent data might be hidden

## 2021-07-29 NOTE — Progress Notes (Signed)
[facility] ROUNDING NOTE   Subjective:   Interval History: This is a 75 year old lady end-stage renal disease Monday Wednesday Friday dialysis.  Last dialysis treatment was 07/19/2021.  Next dialysis treatment will be 07/23/2021.  She was admitted with intractable back pain pathological fracture L4 due to metastatic carcinoma.  She has metastatic disease to the liver and spine with questionable breast primary source.  She is currently being followed by palliative medicine.  She has a history of cardiomyopathy with ejection fraction 30 to 35% and atrial fibrillation which is rate controlled.  She is not requiring any ESA due to the history of cancer   Blood pressure 109/55 pulse 103 temperature 98  Last dialysis 07/27/2021 1 L removed  Next dialysis to be 07/30/2021.   Sodium 132 potassium 3.7 chloride 94 CO2 26 BUN 28 creatinine 5.24 glucose 95 hemoglobin 10.2      Dialysis Orders: adm [facility],MWF, 4hr Edw 88.5,2k, 3ca bath uf2 Hec 2 No Hep Venofer 50  No ESA 2/2 CA    Objective:  Vital signs in last 24 hours:  Temp:  [97.4 F (36.3 C)-98.7 F (37.1 C)] 98.7 F (37.1 C) (09/03 2320) Pulse Rate:  [78-87] 78 (09/03 2320) Resp:  [18-20] 20 (09/03 2320) BP: (87-117)/(44-78) 87/48 (09/03 2320) SpO2:  [98 %-100 %] 100 % (09/03 2320)  Weight change:  Filed Weights   07/27/21 0828 07/27/21 1211 07/28/21 0400  Weight: 93.2 kg 92.2 kg 91.4 kg    Intake/Output: No intake/output data recorded.   Intake/Output this shift:  No intake/output data recorded.  CVS- RRR RS- CTAGeneral:well appearing female in NAD Heart:RRR, no mrg Lungs: breath sounds decreased, nml WOB on 2L via [place] Abdomen:soft, NTND Extremities:trace LE edema Dialysis Access: LU AVF +b/t  ABD- BS present soft non-distended EXT- no edema   Basic Metabolic Panel: Recent Labs  Lab 07/23/21 0723 07/24/21 0427 07/25/21 0244 07/26/21 0505  NA 130* 130* 128* 132*  K 5.1 4.1 4.4 3.7  CL 95* 94* 91*  94*  CO2 24 25 26 26   GLUCOSE 103* 89 93 95  BUN 67* 39* 56* 28*  CREATININE 9.11* 6.50* 7.50* 5.24*  CALCIUM 8.1* 7.6* 7.9* 8.1*  PHOS 5.8*  --   --   --      Liver Function Tests: Recent Labs  Lab 07/23/21 0723  ALBUMIN 3.0*    No results for input(s): LIPASE, AMYLASE in the last 168 hours. No results for input(s): AMMONIA in the last 168 hours.  CBC: Recent Labs  Lab 07/25/21 0244 07/26/21 0505 07/27/21 0242 07/28/21 0016 07/29/21 0151  WBC 6.7 7.5 7.8 7.5 5.9  HGB 9.6* 9.6* 9.2* 10.0* 10.2*  HCT 30.9* 31.1* 30.4* 33.1* 33.8*  MCV 98.1 99.0 99.0 100.3* 100.6*  PLT 118* 119* 130* 138* 131*     Cardiac Enzymes: No results for input(s): CKTOTAL, CKMB, CKMBINDEX, TROPONINI in the last 168 hours.  BNP: Invalid input(s): POCBNP  CBG: Recent Labs  Lab 07/26/21 [place]     Microbiology: Results for orders placed or performed during the hospital encounter of 07/17/21  Resp Panel by RT-PCR (Flu A&B, Covid) Nasopharyngeal Swab     Status: None   Collection Time: 07/17/21  7:12 PM   Specimen: Nasopharyngeal Swab; Nasopharyngeal(NP) swabs in vial transport medium  Result Value Ref Range Status   SARS Coronavirus 2 by RT PCR NEGATIVE NEGATIVE Final    Comment: (NOTE) SARS-CoV-2 target nucleic acids are NOT DETECTED.  The SARS-CoV-2 RNA is  generally detectable in upper respiratory specimens during the acute phase of infection. The lowest concentration of SARS-CoV-2 viral copies this assay can detect is 138 copies/mL. A negative result does not preclude SARS-Cov-2 infection and should not be used as the sole basis for treatment or other patient management decisions. A negative result may occur with  improper specimen collection/handling, submission of specimen other than nasopharyngeal swab, presence of viral mutation(s) within the areas targeted by this assay, and inadequate number of viral copies(<138 copies/mL). A negative result must be combined  with clinical observations, patient history, and epidemiological information. The expected result is Negative.  Fact Sheet for Patients:  EntrepreneurPulse.com.au  Fact Sheet for Healthcare Providers:  IncredibleEmployment.be  This test is no t yet approved or cleared by the [place] FDA and  has been authorized for detection and/or diagnosis of SARS-CoV-2 by FDA under an Emergency Use Authorization (EUA). This EUA will remain  in effect (meaning this test can be used) for the duration of the COVID-19 declaration under Section 564(b)(1) of the Act, 21 U.S.C.section 360bbb-3(b)(1), unless the authorization is terminated  or revoked sooner.       Influenza A by PCR NEGATIVE NEGATIVE Final   Influenza B by PCR NEGATIVE NEGATIVE Final    Comment: (NOTE) The Xpert Xpress SARS-CoV-2/FLU/RSV plus assay is intended as an aid in the diagnosis of influenza from Nasopharyngeal swab specimens and should not be used as a sole basis for treatment. Nasal washings and aspirates are unacceptable for Xpert Xpress SARS-CoV-2/FLU/RSV testing.  Fact Sheet for Patients: EntrepreneurPulse.com.au  Fact Sheet for Healthcare Providers: IncredibleEmployment.be  This test is not yet approved or cleared by the [place] FDA and has been authorized for detection and/or diagnosis of SARS-CoV-2 by FDA under an Emergency Use Authorization (EUA). This EUA will remain in effect (meaning this test can be used) for the duration of the COVID-19 declaration under Section 564(b)(1) of the Act, 21 U.S.C. section 360bbb-3(b)(1), unless the authorization is terminated or revoked.  Performed at [facility], [place] [address] [place], [place] [address]   MRSA Next Gen by PCR, Nasal     Status: None   Collection Time: 07/18/21 11:23 PM   Specimen: Nasal Mucosa; Nasal Swab  Result Value Ref Range Status   MRSA by PCR Next Gen NOT  DETECTED NOT DETECTED Final    Comment: (NOTE) The GeneXpert MRSA Assay (FDA approved for NASAL specimens only), is one component of a comprehensive MRSA colonization surveillance program. It is not intended to diagnose MRSA infection nor to guide or monitor treatment for MRSA infections. Test performance is not FDA approved in patients less than 53 years old. Performed at [facility], [place] [address], [place], [place] [address]     Coagulation Studies: No results for input(s): LABPROT, INR in the last 72 hours.  Urinalysis: No results for input(s): COLORURINE, LABSPEC, PHURINE, GLUCOSEU, HGBUR, BILIRUBINUR, KETONESUR, PROTEINUR, UROBILINOGEN, NITRITE, LEUKOCYTESUR in the last 72 hours.  Invalid input(s): APPERANCEUR    Imaging: No results found.   Medications:       (feeding supplement) PROSource Plus  30 mL Oral BID BM   allopurinol  300 mg Oral QHS   apixaban  5 mg Oral BID   atorvastatin  80 mg Oral QPM   Chlorhexidine Gluconate Cloth  6 each Topical Q0600   cinacalcet  30 mg Oral Q supper   doxercalciferol  2 mcg Intravenous Q M,W,F-HD   ferric citrate  420 mg Oral  TID WC   ferrous sulfate  325 mg Oral Daily   gabapentin  100 mg Oral TID   metoprolol tartrate  12.5 mg Oral BID   midodrine  10 mg Oral Daily   multivitamin  1 tablet Oral QHS   senna-docusate  1 tablet Oral BID   vitamin B-12  1,000 mcg Oral Daily   acetaminophen **OR** acetaminophen, albuterol, iohexol, LORazepam, ondansetron **OR** ondansetron (ZOFRAN) IV, oxyCODONE, polyethylene glycol  Assessment/ Plan:  Intractable Back Pain with pathologic fracture of L4 due to metastatic carcinoma.  MRI spine with severe degenerative changes of lumbar spine, lesion likely representing metastatic disease, +L4-L5 compression fracture. pain meds/management per admit/oncology ESRD -  HD MWF,  Next dialysis will be 07/30/2021 R pleural effusion - s/p IR paracentesis 8/26 with 1.2L yield clear amber fluids.  CXR with improvement in pleural effusion.  Hypertension/volume - BP well controlled with lopressor 12.5mg  BID and midodrine 10mg  daily.  Does not appear grossly volume overloaded. UF as tolerated.  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 Lorenso Courier oncology noted notes. Palliative care consulted, patient now DNAR/DNI.  Family meeting yesterday.  Now DNI/DNAR.  Appreciate palliative assistance.  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  Continue Hectorol 27mcg, Sensipar, auryxia Cardiomyopathy w/EF 30-35% Atrial fib on lopressor and Eliquis.     LOS: [place] @TODAY @7 :22 AM

## 2021-07-29 NOTE — Progress Notes (Signed)
[facility])  Referral received for outpatient palliative care services.  [facility] will follow up with patient and family.  Venia Carbon RN, BSN, [facility] Liaison

## 2021-07-29 NOTE — Progress Notes (Signed)
PROGRESS NOTE  Melissa Adams  DOB: 07/20/1946  PCP: [name], NP NGE:[id number]  DOA: 07/17/2021  LOS: 10 days  Hospital Day: 8   Chief complaint: Back pain  Brief narrative: Melissa Adams is a 75 y.o. 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 Patient follows up with oncologist Dr. Lorenso Courier.  She had an MRI of the abdomen under anesthesia done on 06/19/2021 which showed innumerable enhancing lesions throughout both lobes of liver concerning for metastatic disease. 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   On 8/15, she underwent a liver biopsy that showed adenocarcinoma of unknown primary. She was in excruciating pain in her lower back over the past few weeks because of which she has not been able to walk on her own. On 8/23, patient went to pulmonologist Dr. August Albino office for follow-up.  Because of the severity of her pain, she was sent to the ED for inpatient management. Chest x-ray showed right middle lobe consolidation with moderate right pleural effusion. Patient was admitted to hospitalist service for further evaluation management. See below for details. Currently pending placement.  07/28/2021: Patient seen.  Also discussed extensively with the patient's nurse.  Input from the oncology team is highly appreciated.  According to the oncology team, patient is not a candidate for chemotherapy.  Back pain has improved significantly.  07/29/2021: Patient seen.  Back pain is well controlled.  For hemodialysis tomorrow.  Palliative care input is appreciated.  Palliative care team discussed with the patient's daughter extensively, for home with home  health/palliative care follow-up.  Patient will be discharged once disposition plans are completed.  Subjective: No new complaints. Back pain is controlled.  Assessment/Plan: Intractable right-sided lower back pain Pathological fracture of L4 vertebrae due to metastatic disease -8/26, MRI of lumbar spine showed severe spinal canal stenosis of L4-L5 region and bilateral neuroforaminal narrowing secondary to combination of pathological compression fracture and severe degenerative changes.  Also has multilevel moderate to severe neural foraminal narrowing in the lumbar region.  -Oncology, IR and neurosurgery consulted.  Patient is a poor candidate for neurosurgical intervention. -8/30, patient underwent vertebroplasty by IR.  -At this time, pain is controlled with PRN oral oxycodone as needed. -PT eval was obtained.  SNF recommended.  In process 07/29/2021: Back pain is controlled.  Metastatic adenocarcinoma of unknown primary -Shown on liver biopsy from 8/15. -Unable to go for previously planned outpatient breast biopsy because patient is hospitalized.   -8/30, IR did a vertebral biopsy.  Oncologist Dr. Lorenso Courier to discuss with patient's family about report.  -Palliative care consult appreciated. Had a family meeting on 8/27.  Patient and family wanted to continue treatment measures at this time keeping as a DNR/DNI. 07/28/2021: Oncology input is appreciated.  As per oncology team, patient is not a candidate for chemotherapy.    Right pleural effusion -8/26, underwent thoracentesis with removal of 1.2 L of pleural fluid.  Respiratory status stable.  ESRD-HD-MWF -Nephrology following -Continue midodrine  Chronic systolic CHF Cardiogenic liver cirrhosis -Last EF 30 to 35%.  Patient was on Toprol-XL as a result high dose of Lasix.  With poor oral intake and intermittent hypotension, Lasix was kept on hold.  Toprol to continue at  a lower dose.  A. fib  -Continue Toprol at a lower dose.   Eliquis was held for vertebroplasty.  Resumed postprocedure  Mobility: PT follow-up Code Status:   Code Status: DNR  Nutritional status: Body mass index is 35.69 kg/m.     Diet:  Diet Order             Diet renal with fluid restriction Fluid restriction: 1200 mL Fluid; Room service appropriate? Yes; Fluid consistency: Thin  Diet effective now                  DVT prophylaxis:   apixaban (ELIQUIS) tablet 5 mg   Antimicrobials: None Fluid: None Consultants: Oncology, IR Family Communication: Daughter updated intermittently.  Status is: Inpatient  Remains inpatient appropriate because: Pending SNF at this time  Dispo: The patient is from: Home              Anticipated d/c is to:  Pending SNF              Patient currently is medically stable to d/c.   Difficult to place patient No     Infusions:      Scheduled Meds:  (feeding supplement) PROSource Plus  30 mL Oral BID BM   allopurinol  300 mg Oral QHS   apixaban  5 mg Oral BID   atorvastatin  80 mg Oral QPM   Chlorhexidine Gluconate Cloth  6 each Topical Q0600   Chlorhexidine Gluconate Cloth  6 each Topical Q0600   cinacalcet  30 mg Oral Q supper   doxercalciferol  2 mcg Intravenous Q M,W,F-HD   ferric citrate  420 mg Oral TID WC   ferrous sulfate  325 mg Oral Daily   gabapentin  100 mg Oral TID   metoprolol tartrate  12.5 mg Oral BID   midodrine  10 mg Oral Daily   multivitamin  1 tablet Oral QHS   senna-docusate  1 tablet Oral BID   vitamin B-12  1,000 mcg Oral Daily    Antimicrobials: Anti-infectives (From admission, onward)    Start     Dose/Rate Route Frequency Ordered Stop   07/23/21 0000  ceFAZolin (ANCEF) IVPB 2g/100 mL premix        2 g 200 mL/hr over 30 Minutes Intravenous On call 07/22/21 1422 07/23/21 1410       PRN meds: acetaminophen **OR** acetaminophen, albuterol, iohexol, LORazepam, ondansetron **OR** ondansetron (ZOFRAN) IV, oxyCODONE, polyethylene glycol    Objective: Vitals:   07/29/21 1206 07/29/21 1529  BP: 111/66 (!) 127/59  Pulse:  97  Resp: 19 18  Temp: 98.5 F (36.9 C) 98.7 F (37.1 C)  SpO2: 98% 97%    Intake/Output Summary (Last 24 hours) at 07/29/2021 1721 Last data filed at 07/29/2021 1000 Gross per 24 hour  Intake 120 ml  Output --  Net 120 ml    Filed Weights   07/27/21 0828 07/27/21 1211 07/28/21 0400  Weight: 93.2 kg 92.2 kg 91.4 kg   Weight change:  Body mass index is 35.69 kg/m.   Physical Exam: General exam: Pleasant, elderly African-American female.  Pain controlled.  Not in distress HEENT: Patient is pale.  No jaundice.   Lungs: Clear to auscultation bilaterally CVS: S1-S2. GI/Abd soft, nontender, bowel sound present CNS: Alert, awake, oriented x3. Extremities: No pedal edema, no calf tenderness  Data Review: I have personally reviewed the laboratory data and studies available.  Recent Labs  Lab 07/25/21 0244 07/26/21 0505 07/27/21 0242 07/28/21 0016  07/29/21 0151  WBC 6.7 7.5 7.8 7.5 5.9  HGB 9.6* 9.6* 9.2* 10.0* 10.2*  HCT 30.9* 31.1* 30.4* 33.1* 33.8*  MCV 98.1 99.0 99.0 100.3* 100.6*  PLT 118* 119* 130* 138* 131*    Recent Labs  Lab 07/23/21 0723 07/24/21 0427 07/25/21 0244 07/26/21 0505  NA 130* 130* 128* 132*  K 5.1 4.1 4.4 3.7  CL 95* 94* 91* 94*  CO2 24 25 26 26   GLUCOSE 103* 89 93 95  BUN 67* 39* 56* 28*  CREATININE 9.11* 6.50* 7.50* 5.24*  CALCIUM 8.1* 7.6* 7.9* 8.1*  PHOS 5.8*  --   --   --      F/u labs ordered Unresulted Labs (From admission, onward)     Start     Ordered   07/21/21 0500  CBC  Daily,   R     Question:  Specimen collection method  Answer:  Lab=Lab collect   07/20/21 1840   Signed and Held  Renal function panel  Once,   R       Question:  Specimen collection method  Answer:  Lab=Lab collect   Signed and Held   Signed and Held  CBC  Once,   R       Question:  Specimen collection method  Answer:  Lab=Lab collect   Signed and Held   Signed  and Held  CBC  Once,   R       Question:  Specimen collection method  Answer:  Lab=Lab collect   Signed and Held   Signed and Held  Renal function panel  Once,   R       Question:  Specimen collection method  Answer:  Lab=Lab collect   Signed and Held   Signed and Held  CBC  Once,   R       Question:  Specimen collection method  Answer:  Lab=Lab collect   Signed and Held            Time spent: 25 minutes  Signed, Bonnell Public, MD [facility] 07/29/2021

## 2021-07-30 LAB — CBC
HCT: 34.7 % — ABNORMAL LOW (ref 36.0–46.0)
Hemoglobin: 10.9 g/dL — ABNORMAL LOW (ref 12.0–15.0)
MCH: 31 pg (ref 26.0–34.0)
MCHC: 31.4 g/dL (ref 30.0–36.0)
MCV: 98.6 fL (ref 80.0–100.0)
Platelets: 138 10*3/uL — ABNORMAL LOW (ref 150–400)
RBC: 3.52 MIL/uL — ABNORMAL LOW (ref 3.87–5.11)
RDW: 17 % — ABNORMAL HIGH (ref 11.5–15.5)
WBC: 7 10*3/uL (ref 4.0–10.5)
nRBC: 0 % (ref 0.0–0.2)

## 2021-07-30 LAB — RENAL FUNCTION PANEL
Albumin: 2.5 g/dL — ABNORMAL LOW (ref 3.5–5.0)
Anion gap: 10 (ref 5–15)
BUN: 55 mg/dL — ABNORMAL HIGH (ref 8–23)
CO2: 26 mmol/L (ref 22–32)
Calcium: 8.4 mg/dL — ABNORMAL LOW (ref 8.9–10.3)
Chloride: 88 mmol/L — ABNORMAL LOW (ref 98–111)
Creatinine, Ser: 6.48 mg/dL — ABNORMAL HIGH (ref 0.44–1.00)
GFR, Estimated: 6 mL/min — ABNORMAL LOW (ref >60)
Glucose, Bld: 147 mg/dL — ABNORMAL HIGH (ref 70–99)
Phosphorus: 4.4 mg/dL (ref 2.5–4.6)
Potassium: 4 mmol/L (ref 3.5–5.1)
Sodium: 124 mmol/L — ABNORMAL LOW (ref 135–145)

## 2021-07-30 MED ORDER — ALBUMIN HUMAN 25 % IV SOLN
25.0000 g | Freq: Once | INTRAVENOUS | Status: AC
  Administered 2021-07-30: 25 g via INTRAVENOUS
  Filled 2021-07-30: qty 100

## 2021-07-30 MED ORDER — NEPRO/CARBSTEADY PO LIQD
237.0000 mL | ORAL | Status: DC
  Administered 2021-07-30 – 2021-07-31 (×2): 237 mL via ORAL
  Filled 2021-07-30: qty 237

## 2021-07-30 NOTE — Progress Notes (Signed)
Initial Nutrition Assessment  DOCUMENTATION CODES:   Not applicable  INTERVENTION:   - Liberalize diet to Regular with 1200 ml fluid restriction, verbal with readback placed per Nephrology NP  - Continue ProSource Plus 30 ml po BID, each supplement provides 100 kcal and 15 grams of protein daily  - Nepro Shake po daily, each supplement provides 425 kcal and 19 grams protein  - Renal MVI daily  - Encourage PO intake  NUTRITION DIAGNOSIS:   Increased nutrient needs related to cancer and cancer related treatments as evidenced by estimated needs.  GOAL:   Patient will meet greater than or equal to 90% of their needs  MONITOR:   PO intake, Supplement acceptance, Labs, Weight trends, Skin, I & O's  REASON FOR ASSESSMENT:   Consult Assessment of nutrition requirement/status  ASSESSMENT:   75 year old female who presented to the ED on 8/23 with severe back pain. PMH of atrial fibrillation, ESRD on HD, CHF, cirrhosis, recent diagnosis of metastatic adenocarcinoma of unknown primary (likely breast) to liver based on biopsy earlier this month, pathologic fracture of L4 likely due to metastatic disease, duodenal neuroendocrine tumor s/p resection in March 2022.  8/26 - s/p thoracentesis with 1.2 L fluid removed 8/29 - s/p L4 bone biopsy and FRA/balloon kyphoplasty  Pt to receive HD today. Noted last HD was on 9/02 with 1000 ml net UF. Post-HD weight was 92.2 kg. RD attempted to visit pt today; however, pt in HD at time of RD visit.  Reviewed notes; Palliative Care team is following pt.  Pt's PO intake has been variable with 70-100% meal completions for 4 documented meals and 0% for 1 documented meal. Discussed diet liberalization with Nephrology NP who agreed. Verbal with readback order placed for a regular diet with 1200 ml fluid restriction. RD to order oral nutrition supplements to aid pt in meeting kcal and protein needs.  Reviewed weight history in chart. Current weight of  95.8 kg (prior to HD today) is above EDW of 88.5 kg. Weight appears fairly stable over the last 9 months with fluctuations between 90-96 kg. No real trends noted. Pt with +1 pitting generalized edema per nursing documentation.  EDW: 88.5 kg Admit weight: 93.4 kg Current weight: 95.8 kg  Meal Completion: 0-100%  Medications reviewed and include: ProSource Plus 30 ml po BID, sensipar, hectorol, ferric citrate, ferrous sulfate, rena-vit, senna, vitamin B-12  Labs reviewed.  I/O's: -2.4 L since admit  NUTRITION - FOCUSED PHYSICAL EXAM:  Unable to complete at this time. Pt in HD.  Diet Order:   Diet Order             Diet regular Room service appropriate? Yes; Fluid consistency: Thin; Fluid restriction: 1200 mL Fluid  Diet effective now                   EDUCATION NEEDS:   Not appropriate for education at this time  Skin:  Skin Assessment: Skin Integrity Issues: Other: laceration to buttocks  Last BM:  07/28/21  Height:   Ht Readings from Last 1 Encounters:  07/20/21 5\' 3"  (1.6 m)    Weight:   Wt Readings from Last 1 Encounters:  07/30/21 95.8 kg    BMI:  Body mass index is 37.41 kg/m.  Estimated Nutritional Needs:   Kcal:  1700-1900  Protein:  90-110 grams  Fluid:  1000 ml + UOP    Gustavus Bryant, MS, RD, LDN Inpatient Clinical Dietitian Please see AMiON for contact information.

## 2021-07-30 NOTE — Progress Notes (Signed)
[facility] Progress Note   Subjective:    Seen and examined patient at bedside. Denies SOB. Plan for HD today.  Objective Vitals:   07/29/21 2337 07/30/21 0402 07/30/21 0758 07/30/21 1112  BP: (!) 112/54 103/69 (!) 90/54 105/77  Pulse: 95 83 100   Resp:   12 12  Temp: 99 F (37.2 C) 99.2 F (37.3 C) 98.6 F (37 C) 98.5 F (36.9 C)  TempSrc: Oral Oral Oral Oral  SpO2: 100% 97% 98%   Weight:      Height:       Physical Exam General: Ill-appearing; NAD Heart: S1 and S2; No murmurs, gallops, or rubs Lungs: Clear throughout; No wheezing, rales, or rhonchi Abdomen: Soft and Non-tender Extremities: 2+ edema BL lower extremities and hips Dialysis Access: L AVF (+) bruit/thrill   Filed Weights   07/27/21 0828 07/27/21 1211 07/28/21 0400  Weight: 93.2 kg 92.2 kg 91.4 kg    Intake/Output Summary (Last 24 hours) at 07/30/2021 1237 Last data filed at 07/30/2021 1100 Gross per 24 hour  Intake 486 ml  Output --  Net 486 ml    Additional Objective Labs: Basic Metabolic Panel: Recent Labs  Lab 07/24/21 0427 07/25/21 0244 07/26/21 0505  NA 130* 128* 132*  K 4.1 4.4 3.7  CL 94* 91* 94*  CO2 25 26 26   GLUCOSE 89 93 95  BUN 39* 56* 28*  CREATININE 6.50* 7.50* 5.24*  CALCIUM 7.6* 7.9* 8.1*   Liver Function Tests: No results for input(s): AST, ALT, ALKPHOS, BILITOT, PROT, ALBUMIN in the last 168 hours. No results for input(s): LIPASE, AMYLASE in the last 168 hours. CBC: Recent Labs  Lab 07/26/21 0505 07/27/21 0242 07/28/21 0016 07/29/21 0151 07/30/21 0336  WBC 7.5 7.8 7.5 5.9 7.0  HGB 9.6* 9.2* 10.0* 10.2* 10.9*  HCT 31.1* 30.4* 33.1* 33.8* 34.7*  MCV 99.0 99.0 100.3* 100.6* 98.6  PLT 119* 130* 138* 131* 138*   Blood Culture    Component Value Date/Time   SDES PLEURAL 11/18/2020 1312   SPECREQUEST NONE 11/18/2020 1312   CULT  11/18/2020 1312    NO GROWTH 5 DAYS Performed at [facility], [place] [address] [place], [place] [address]     REPTSTATUS 11/23/2020 FINAL 11/18/2020 1312    Cardiac Enzymes: No results for input(s): CKTOTAL, CKMB, CKMBINDEX, TROPONINI in the last 168 hours. CBG: Recent Labs  Lab 07/26/21 1139  GLUCAP 121*   Iron Studies: No results for input(s): IRON, TIBC, TRANSFERRIN, FERRITIN in the last 72 hours. Lab Results  Component Value Date   INR 1.2 07/09/2021   INR CANCELED 05/10/2021   INR 1.4 (H) 11/14/2020   Studies/Results: No results found.  Medications:   (feeding supplement) PROSource Plus  30 mL Oral BID BM   allopurinol  300 mg Oral QHS   apixaban  5 mg Oral BID   atorvastatin  80 mg Oral QPM   Chlorhexidine Gluconate Cloth  6 each Topical Q0600   Chlorhexidine Gluconate Cloth  6 each Topical Q0600   cinacalcet  30 mg Oral Q supper   doxercalciferol  2 mcg Intravenous Q M,W,F-HD   ferric citrate  420 mg Oral TID WC   ferrous sulfate  325 mg Oral Daily   gabapentin  100 mg Oral TID   metoprolol tartrate  12.5 mg Oral BID   midodrine  10 mg Oral Daily   multivitamin  1 tablet Oral QHS   senna-docusate  1 tablet Oral BID  vitamin B-12  1,000 mcg Oral Daily    Dialysis Orders: AF-MWF 4hrs BFR 450 DFR 500 EDW 88.5kg 2K, 3Ca L AVF NO ESA 2nd CA  Assessment/Plan: Intractable Back Pain with pathologic fracture of L4 due to metastatic carcinoma.  MRI spine with severe degenerative changes of lumbar spine, lesion likely representing metastatic disease, +L4-L5 compression fracture. pain meds/management per admit/oncology ESRD -  HD MWF. HD today. R pleural effusion - s/p IR paracentesis 8/26 with 1.2L yield clear amber fluids. CXR with improvement in pleural effusion.  Hypertension/volume - Bps variable, noted some low readings, on lopressor 12.5mg  BID and midodrine 10mg  daily. Noted 2+ edema BL lower extremities and hips on exam-raised her UFG to 3.5L for today's treatment, will watch her volume status and Bps closely. 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 Followed by Dr. Lorenso Courier oncology noted notes. Palliative care consulted, patient now DNAR/DNI.  Family meeting yesterday.  Now DNI/DNAR.  Appreciate palliative assistance.  Anemia of ESRD - last Hgb 10.9 and no ESA 2/2 cancer Metabolic bone disease - Calcium and phos in goal.  Continue Hectorol 22mcg, Sensipar, auryxia Cardiomyopathy w/EF 30-35% Atrial fib on lopressor and Eliquis.   Tobie Poet, NP [facility] 07/30/2021,12:37 PM  LOS: 11 days

## 2021-07-31 DIAGNOSIS — M4856XS Collapsed vertebra, not elsewhere classified, lumbar region, sequela of fracture: Secondary | ICD-10-CM

## 2021-07-31 DIAGNOSIS — I5022 Chronic systolic (congestive) heart failure: Secondary | ICD-10-CM | POA: Diagnosis not present

## 2021-07-31 DIAGNOSIS — I482 Chronic atrial fibrillation, unspecified: Secondary | ICD-10-CM | POA: Diagnosis not present

## 2021-07-31 DIAGNOSIS — K7460 Unspecified cirrhosis of liver: Secondary | ICD-10-CM | POA: Diagnosis not present

## 2021-07-31 LAB — CBC
HCT: 29.6 % — ABNORMAL LOW (ref 36.0–46.0)
Hemoglobin: 8.8 g/dL — ABNORMAL LOW (ref 12.0–15.0)
MCH: 30.2 pg (ref 26.0–34.0)
MCHC: 29.7 g/dL — ABNORMAL LOW (ref 30.0–36.0)
MCV: 101.7 fL — ABNORMAL HIGH (ref 80.0–100.0)
Platelets: 137 10*3/uL — ABNORMAL LOW (ref 150–400)
RBC: 2.91 MIL/uL — ABNORMAL LOW (ref 3.87–5.11)
RDW: 17.1 % — ABNORMAL HIGH (ref 11.5–15.5)
WBC: 5.5 10*3/uL (ref 4.0–10.5)
nRBC: 0 % (ref 0.0–0.2)

## 2021-07-31 MED ORDER — LIDOCAINE HCL (PF) 1 % IJ SOLN: 5.0000 mL | INTRAMUSCULAR | Status: DC | PRN

## 2021-07-31 MED ORDER — PENTAFLUOROPROP-TETRAFLUOROETH EX AERO: 1.0000 "application " | INHALATION_SPRAY | CUTANEOUS | Status: DC | PRN

## 2021-07-31 MED ORDER — RENA-VITE PO TABS: 1.0000 | ORAL_TABLET | Freq: Every day | ORAL | 0 refills | Status: DC

## 2021-07-31 MED ORDER — POLYETHYLENE GLYCOL 3350 17 G PO PACK: 17.0000 g | PACK | Freq: Every day | ORAL | 0 refills | Status: DC | PRN

## 2021-07-31 MED ORDER — METOPROLOL TARTRATE 25 MG PO TABS
12.5000 mg | ORAL_TABLET | Freq: Two times a day (BID) | ORAL | 0 refills | Status: DC
Start: 2021-07-31 — End: 2021-08-21

## 2021-07-31 MED ORDER — CHLORHEXIDINE GLUCONATE CLOTH 2 % EX PADS: 6.0000 | MEDICATED_PAD | Freq: Every day | CUTANEOUS | Status: DC

## 2021-07-31 MED ORDER — SODIUM CHLORIDE 0.9 % IV SOLN: 100.0000 mL | INTRAVENOUS | Status: DC | PRN

## 2021-07-31 MED ORDER — HEPARIN SODIUM (PORCINE) 1000 UNIT/ML DIALYSIS: 1000.0000 [IU] | INTRAMUSCULAR | Status: DC | PRN

## 2021-07-31 MED ORDER — ALTEPLASE 2 MG IJ SOLR: 2.0000 mg | Freq: Once | INTRAMUSCULAR | Status: DC | PRN

## 2021-07-31 MED ORDER — LIDOCAINE-PRILOCAINE 2.5-2.5 % EX CREA: 1.0000 "application " | TOPICAL_CREAM | CUTANEOUS | Status: DC | PRN

## 2021-07-31 NOTE — Progress Notes (Signed)
[facility] Progress Note   Subjective:    Patient seen and examined at bedside. No complaints. Tolerated yesterday's HD with UF 2.4L. She was mentioning possibly going home today. Plan for HD 9/7 if she is still here.  Objective Vitals:   07/30/21 2350 07/31/21 0355 07/31/21 0802 07/31/21 1136  BP: (!) 107/54 (!) 105/50 105/61 106/71  Pulse: (!) 104 72 70 99  Resp:   14 15  Temp: 98.5 F (36.9 C) 97.8 F (36.6 C) (!) 96.6 F (35.9 C) 97.6 F (36.4 C)  TempSrc: Oral Oral Axillary Oral  SpO2: 92% 99% 100% 99%  Weight:      Height:       Physical Exam General: Ill-appearing; NAD Heart: S1 and S2; No murmurs, gallops, or rubs Lungs: Clear throughout; No wheezing, rales, or rhonchi Abdomen: Soft and Non-tender Extremities: 1+ edema BL lower extremities  Dialysis Access: L AVF (+) bruit/thrill   Filed Weights   07/28/21 0400 07/30/21 1145 07/30/21 1536  Weight: 91.4 kg 95.8 kg 93.3 kg    Intake/Output Summary (Last 24 hours) at 07/31/2021 1333 Last data filed at 07/31/2021 1300 Gross per 24 hour  Intake 587 ml  Output 2400 ml  Net -1813 ml    Additional Objective Labs: Basic Metabolic Panel: Recent Labs  Lab 07/25/21 0244 07/26/21 0505 07/30/21 1220  NA 128* 132* 124*  K 4.4 3.7 4.0  CL 91* 94* 88*  CO2 26 26 26   GLUCOSE 93 95 147*  BUN 56* 28* 55*  CREATININE 7.50* 5.24* 6.48*  CALCIUM 7.9* 8.1* 8.4*  PHOS  --   --  4.4   Liver Function Tests: Recent Labs  Lab 07/30/21 1220  ALBUMIN 2.5*   No results for input(s): LIPASE, AMYLASE in the last 168 hours. CBC: Recent Labs  Lab 07/27/21 0242 07/28/21 0016 07/29/21 0151 07/30/21 0336 07/31/21 0135  WBC 7.8 7.5 5.9 7.0 5.5  HGB 9.2* 10.0* 10.2* 10.9* 8.8*  HCT 30.4* 33.1* 33.8* 34.7* 29.6*  MCV 99.0 100.3* 100.6* 98.6 101.7*  PLT 130* 138* 131* 138* 137*   Blood Culture    Component Value Date/Time   SDES PLEURAL 11/18/2020 1312   SPECREQUEST NONE 11/18/2020 1312   CULT   11/18/2020 1312    NO GROWTH 5 DAYS Performed at [facility], [place] [address], [place], [place] [address]    REPTSTATUS 11/23/2020 FINAL 11/18/2020 1312    Cardiac Enzymes: No results for input(s): CKTOTAL, CKMB, CKMBINDEX, TROPONINI in the last 168 hours. CBG: Recent Labs  Lab 07/26/21 1139  GLUCAP 121*   Iron Studies: No results for input(s): IRON, TIBC, TRANSFERRIN, FERRITIN in the last 72 hours. Lab Results  Component Value Date   INR 1.2 07/09/2021   INR CANCELED 05/10/2021   INR 1.4 (H) 11/14/2020   Studies/Results: No results found.  Medications:   (feeding supplement) PROSource Plus  30 mL Oral BID BM   allopurinol  300 mg Oral QHS   apixaban  5 mg Oral BID   atorvastatin  80 mg Oral QPM   Chlorhexidine Gluconate Cloth  6 each Topical Q0600   Chlorhexidine Gluconate Cloth  6 each Topical Q0600   cinacalcet  30 mg Oral Q supper   doxercalciferol  2 mcg Intravenous Q M,W,F-HD   feeding supplement (NEPRO CARB STEADY)  237 mL Oral Q24H   ferric citrate  420 mg Oral TID WC   ferrous sulfate  325 mg Oral Daily   gabapentin  100 mg  Oral TID   metoprolol tartrate  12.5 mg Oral BID   midodrine  10 mg Oral Daily   multivitamin  1 tablet Oral QHS   senna-docusate  1 tablet Oral BID   vitamin B-12  1,000 mcg Oral Daily    Dialysis Orders: AF-MWF 4hrs BFR 450 DFR 500 EDW 88.5kg 2K, 3Ca L AVF NO ESA 2nd CA  Assessment/Plan: Intractable Back Pain with pathologic fracture of L4 due to metastatic carcinoma.  MRI spine with severe degenerative changes of lumbar spine, lesion likely representing metastatic disease, +L4-L5 compression fracture. pain meds/management per admit/oncology ESRD -  HD MWF. Next HD on 08/01/21. R pleural effusion - s/p IR paracentesis 8/26 with 1.2L yield clear amber fluids. CXR with improvement in pleural effusion.  Hypertension/volume - Bps variable, noted some low readings, on lopressor 12.5mg  BID and midodrine 10mg  daily. Noted 2+ edema  BL lower extremities and hips on exam-raised her UFG to 3.5L for today's treatment, will watch her volume status and Bps closely. 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 Followed by Dr. Lorenso Courier oncology noted notes. Palliative care consulted, patient now DNAR/DNI.  Family meeting yesterday.  Now DNI/DNAR.  Appreciate palliative assistance.  Anemia of ESRD - last Hgb now 8.8 and no ESA 2/2 cancer-monitor trends closely. Metabolic bone disease - Calcium and phos in goal.  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   Tobie Poet, NP [facility] 07/31/2021,1:33 PM  LOS: 12 days

## 2021-07-31 NOTE — Progress Notes (Signed)
Physical Therapy Treatment Patient Details Name: Melissa Adams MRN: [id number] DOB: 05/03/1946 Today's Date: 07/31/2021    History of Present Illness Pt is a 75 y.o. female admitted 07/17/21 with back pain and reports of LLE instability after recent fall with pathologic L4 compression fx. S/p RFA and balloon kyphoplasty of L4. Workup also revealed mets to liver and R breast; of note, pt with recent dx of metastatic adenocarcinoma. PMH includes afib, ESRD, CHF, cirrhosis, duodenal neuroendocrine tumor.   PT Comments    Pt with increased fatigue/lethargy this session, keeping eyes closed, although at times answering questions fluently and following simple commands. Difficult to determine true cognitive impairment versus fatigue/lethargy versus desire to participate. Pt requiring modA+2 for bed mobility; dependent for pericare and linen change due to incontinence. Per chart, pt preparing for d/c home today as family declined SNF; DME delivered to room. Pt not receptive to education regarding DME use. Pt will likely require ambulance transport home (case manager notified). If to remain admitted, will continue to follow acutely.    Follow Up Recommendations  SNF;Supervision/Assistance - 24 hour (family declined)     Equipment Recommendations  Rolling walker with 5" wheels;3in1 (PT);Wheelchair (measurements PT);Wheelchair cushion (measurements PT);Hospital bed;Other (comment) (lift equipment)    Recommendations for Other Services       Precautions / Restrictions Precautions Precautions: Fall;Other (comment) Precaution Comments: bladder/bowel incontinence; wears 3L O2 baseline Restrictions Weight Bearing Restrictions: No    Mobility  Bed Mobility Overal bed mobility: Needs Assistance Bed Mobility: Rolling Rolling: Mod assist;+2 for safety/equipment Sidelying to sit: Mod assist Supine to sit: Mod assist Sit to supine: Min assist;HOB elevated   General bed mobility comments: Pt able  to assist rolling R/L with use of bed rail when cued, modA for pelvic rotation; dependent for pericare/linen change due to bowel incontinence    Transfers Overall transfer level: Needs assistance Equipment used: Rolling walker (2 wheeled) Transfers: Sit to/from Stand Sit to Stand: From elevated surface (attempted 2 trials then pt decided to lay back down)         General transfer comment: Deferred secondary to lethargy and minimal participation  Ambulation/Gait                 Stairs             Wheelchair Mobility    Modified Rankin (Stroke Patients Only)       Balance Overall balance assessment: Needs assistance Sitting-balance support: Feet supported;Bilateral upper extremity supported Sitting balance-Leahy Scale: Fair                                      Cognition Arousal/Alertness: Lethargic Behavior During Therapy: Flat affect Overall Cognitive Status: No family/caregiver present to determine baseline cognitive functioning Area of Impairment: Attention;Following commands;Safety/judgement;Awareness;Problem solving                 Orientation Level: Disoriented to;Time Current Attention Level: Sustained   Following Commands: Follows one step commands with increased time Safety/Judgement: Decreased awareness of safety;Decreased awareness of deficits Awareness: Emergent Problem Solving: Slow processing;Decreased initiation;Difficulty sequencing;Requires verbal cues;Requires tactile cues General Comments: Pt keeping eyes closed majority of session, increased interaction and verbal responses with increased mobility, still keeping eyes closed with inconsistent command following and answering questions. Difficult to determine true cognitive impairment versus fatigue versus desire to participate      Exercises      General Comments  General comments (skin integrity, edema, etc.): Attempted discussion regarding d/c plans; pt happy to  d/c home "and finally get some sleep." Noted DME delivered (RW, 3in1, wheelchair), pt not interested or able to be trained on new DME. Suspect pt would be unable to get in/out of family's car; recommend ambulance transport home (CM notified)      Pertinent Vitals/Pain Pain Assessment: Faces Faces Pain Scale: Hurts a little bit Pain Location: Generalized Pain Descriptors / Indicators: Grimacing Pain Intervention(s): Monitored during session;Repositioned    Home Living                      Prior Function            PT Goals (current goals can now be found in the care plan section) Acute Rehab PT Goals Patient Stated Goal: to sleep Progress towards PT goals: Not progressing toward goals - comment (fatigue/lethargy this session)    Frequency    Min 3X/week      PT Plan Current plan remains appropriate    Co-evaluation              AM-PAC PT "6 Clicks" Mobility   Outcome Measure  Help needed turning from your back to your side while in a flat bed without using bedrails?: A Lot Help needed moving from lying on your back to sitting on the side of a flat bed without using bedrails?: A Lot Help needed moving to and from a bed to a chair (including a wheelchair)?: Total Help needed standing up from a chair using your arms (e.g., wheelchair or bedside chair)?: Total Help needed to walk in hospital room?: Total Help needed climbing 3-5 steps with a railing? : Total 6 Click Score: 8    End of Session Equipment Utilized During Treatment: Oxygen Activity Tolerance: Patient limited by fatigue;Patient limited by lethargy Patient left: in bed;with call bell/phone within reach;with bed alarm set Nurse Communication: Mobility status PT Visit Diagnosis: Unsteadiness on feet (R26.81);Difficulty in walking, not elsewhere classified (R26.2)     Time: 8882-8003 PT Time Calculation (min) (ACUTE ONLY): 19 min  Charges:  $Therapeutic Activity: 8-22 mins                     Mabeline Caras, PT, DPT Acute Rehabilitation Services  Pager [phone] Office [place] 07/31/2021, 3:03 PM

## 2021-07-31 NOTE — Progress Notes (Signed)
Occupational Therapy Treatment Patient Details Name: Melissa Adams MRN: [id number] DOB: 09/07/1946 Today's Date: 07/31/2021    History of present illness Pt is a 75 yr old female who presented due back pain and reports of Le going out underneath the pt. Pt s/p RFA and balloon kyphoplasty of L4. 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 PMH but not limited to: afib, ESRD, CHF with EF of 30-35%, cariogenic cirrhosis, hypotension, duodenal neuroendocrine tumor, R peural effusion   OT comments  Pt took increase time to arousal but agrreed to session. Pt noted to have increase in AROM of BUE and FM use to be able to hold onto bed rails and use with activities in session. Pt required min guard for rolling in bed, side lying to sitting with moderate assist and HOB elevated, attempted 3 trials to stand but then pt decline further as pt started to lay back into the bed with min assist . Pt currently with functional limitations due to the deficits listed below (see OT Problem List).  Pt will benefit from skilled OT to increase their safety and independence with ADL and functional mobility for ADL to facilitate discharge to venue listed below.   BP supine 89/52 Supine 101/57 Sitting 122/55   Follow Up Recommendations  SNF;Supervision/Assistance - 24 hour;Other (comment) (Per chart family would like to go home and if so recomendation HH OT)    Financial risk analyst (measurements OT);Wheelchair cushion (measurements OT) (hospital bed)    Recommendations for Other Services      Precautions / Restrictions Precautions Precautions: Fall Precaution Comments: lethargy, tremors       Mobility Bed Mobility Overal bed mobility: Needs Assistance Bed Mobility: Rolling;Sidelying to Sit;Sit to Sidelying Rolling: Min guard Sidelying to sit: Mod assist Supine to sit: Mod assist Sit to supine: Min assist;HOB elevated        Transfers Overall transfer level: Needs  assistance Equipment used: Rolling walker (2 wheeled) Transfers: Sit to/from Stand Sit to Stand: From elevated surface (attempted 2 trials then pt decided to lay back down)              Balance Overall balance assessment: Needs assistance Sitting-balance support: Feet supported;Bilateral upper extremity supported Sitting balance-Leahy Scale: Fair                                     ADL either performed or assessed with clinical judgement   ADL Overall ADL's : Needs assistance/impaired Eating/Feeding: Cueing for safety;Cueing for sequencing;Sitting;Minimal assistance   Grooming: Moderate assistance;Cueing for safety;Cueing for sequencing;Sitting   Upper Body Bathing: Maximal assistance;Cueing for safety;Cueing for sequencing;Bed level   Lower Body Bathing: Maximal assistance;Cueing for safety;Cueing for sequencing;Bed level   Upper Body Dressing : Moderate assistance;Cueing for safety;Cueing for sequencing;Sitting   Lower Body Dressing: Maximal assistance;Cueing for safety;Cueing for sequencing;Sitting/lateral leans   Toilet Transfer: Total assistance;+2 for physical assistance;+2 for safety/equipment             General ADL Comments: Pt decline to ambulate     Vision       Perception     Praxis      Cognition Arousal/Alertness:  (slow to arousal) Behavior During Therapy: Flat affect Overall Cognitive Status: Impaired/Different from baseline Area of Impairment: Attention;Following commands;Safety/judgement;Awareness;Problem solving                 Orientation Level: Disoriented to;Time Current Attention Level: Sustained  Following Commands: Follows one step commands with increased time Safety/Judgement: Decreased awareness of safety;Decreased awareness of deficits Awareness: Emergent Problem Solving: Slow processing;Decreased initiation;Difficulty sequencing;Requires verbal cues;Requires tactile cues General Comments: as pt increase  in activity became increase in level of particiaption        Exercises     Shoulder Instructions       General Comments      Pertinent Vitals/ Pain       Pain Assessment: Faces Faces Pain Scale: Hurts a little bit Pain Location: back Pain Descriptors / Indicators: Grimacing Pain Intervention(s): Monitored during session  Home Living                                          Prior Functioning/Environment              Frequency  Min 2X/week        Progress Toward Goals  OT Goals(current goals can now be found in the care plan section)  Progress towards OT goals: Progressing toward goals  Acute Rehab OT Goals Patient Stated Goal: to sleep OT Goal Formulation: With patient Time For Goal Achievement: 08/04/21 Potential to Achieve Goals: Fair ADL Goals Pt Will Perform Eating: with mod assist;with adaptive utensils;bed level Pt Will Perform Grooming: with mod assist;bed level Pt Will Perform Upper Body Dressing: with mod assist;bed level Pt Will Perform Lower Body Dressing: with mod assist;bed level  Plan Discharge plan remains appropriate    Co-evaluation                 AM-PAC OT "6 Clicks" Daily Activity     Outcome Measure   Help from another person eating meals?: A Little Help from another person taking care of personal grooming?: A Little Help from another person toileting, which includes using toliet, bedpan, or urinal?: A Lot Help from another person bathing (including washing, rinsing, drying)?: A Lot Help from another person to put on and taking off regular upper body clothing?: A Little Help from another person to put on and taking off regular lower body clothing?: A Lot 6 Click Score: 15    End of Session Equipment Utilized During Treatment: Gait belt;Rolling walker  OT Visit Diagnosis: Unsteadiness on feet (R26.81);Other abnormalities of gait and mobility (R26.89);Repeated falls (R29.6);Muscle weakness (generalized)  (M62.81);History of falling (Z91.81)   Activity Tolerance Patient limited by lethargy   Patient Left in bed;with call bell/phone within reach;with bed alarm set   Nurse Communication Mobility status;Other (comment) (BP)        Time: 0630-1601 OT Time Calculation (min): 28 min  Charges: OT General Charges $OT Visit: 1 Visit OT Treatments $Self Care/Home Management : 23-37 mins  Joeseph Amor OTR/L  Acute Rehab Services  [phone] office number [phone] pager number    Joeseph Amor 07/31/2021, 1:04 PM

## 2021-07-31 NOTE — Discharge Summary (Signed)
Discharge Summary  Melissa Adams NWG:[id number] DOB: Jul 23, 1946  PCP: [name], NP  Admit date: 07/17/2021 Discharge date: August 01, 2021 (patient was discharged July 31, 2021 but was not picked up or did not leave the hospital until September 7 at [place]            Patient was picked up by [facility] at 0310  to be transported. Patient is in stable condition and appropriate for discharge. Daughter [name] informed that patient is on the way home with [facility]         Time spent: 35 minutes  Recommendations for Outpatient Follow-up:  Primary care  Discharge Diagnoses:  Active Hospital Problems   Diagnosis Date Noted   Pathologic compression fracture of lumbar vertebra ([place]) 07/18/2021   Metastatic adenocarcinoma to liver ([place]) 07/18/2021   Cirrhosis of liver with ascites ([facility])    Chronic systolic CHF (congestive heart failure) ([place]) 11/13/2020   ESRD on dialysis ([place]) 01/11/2020   Atrial fibrillation, chronic ([place]) 10/11/2019    Resolved Hospital Problems  No resolved problems to display.    Discharge Condition: Improved  Diet recommendation: Cardiac/renal  Vitals:   07/31/21 0802 07/31/21 1136  BP: 105/61 106/71  Pulse: 70 99  Resp: 14 15  Temp: (!) 96.6 F (35.9 C) 97.6 F (36.4 C)  SpO2: 100% 99%    History of present illness:  75 year old female with past medical history significant for A. 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 Lorenso Courier oncology.  Patient was admitted to the inpatient on July 17, 2021 because she was having severe pain when she went to see her pulmonologist.  X-ray showed right middle lobe consolidation with moderate right pleural effusion  Hospital Course:  Principal  Problem:   Pathologic compression fracture of lumbar vertebra [facility]) Active Problems:   Atrial fibrillation, chronic ([place])   ESRD on dialysis ([place])   Chronic systolic CHF (congestive heart failure) ([place])   Cirrhosis of liver with ascites ([place])   Metastatic adenocarcinoma to liver [facility])  75 year old female with past medical history significant for A. 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 Lorenso Courier oncology.  Patient was admitted to the inpatient on July 17, 2021 because she was having severe pain when she went to see her pulmonologist.  X-ray showed right middle lobe consolidation with moderate right pleural effusion Patient received hemodialysis, She was discharged home with home health Procedures: Hemodialysis  Consultations: Nephrology  Discharge Exam: BP 106/71 (BP Location: Left Arm)   Pulse 99   Temp 97.6 F (36.4 C) (Oral)   Resp 15   Ht 5\' 3"  (1.6 m)   Wt 93.3 kg   SpO2 99%   BMI 36.44 kg/m   General: Alert oriented x3 Cardiovascular: Regular rate and rhythm Respiratory: Clear to auscultation  Discharge Instructions You were cared for by a hospitalist during your hospital stay. If you have any questions about your discharge medications or the care you received while you were in the hospital after you are discharged, you can call the unit and asked to speak  with the hospitalist on call if the hospitalist that took care of you is not available. Once you are discharged, your primary care physician will handle any further medical issues. Please note that NO REFILLS for any discharge medications will be authorized once you are discharged, as it is imperative that you return to your primary care physician (or establish a relationship  with a primary care physician if you do not have one) for your aftercare needs so that they can reassess your need for medications and monitor your lab values.  Discharge Instructions     Call MD for:  severe uncontrolled pain   Complete by: As directed    Call MD for:  temperature >100.4   Complete by: As directed    Diet - low sodium heart healthy   Complete by: As directed    Discharge instructions   Complete by: As directed    Follow-up with dialysis center for hemodialysis Monday Wednesday and Friday Follow-up with primary care provider   Discharge wound care:   Complete by: As directed    Wound dressing per inpatient order for right labia stage II   Increase activity slowly   Complete by: As directed       Allergies as of 07/31/2021       Reactions   Black Walnut Flavor Hives   Shellfish Allergy Itching   Makes throat itch         Medication List     STOP taking these medications    ascorbic acid 500 MG tablet Commonly known as: VITAMIN C   furosemide 80 MG tablet Commonly known as: LASIX   metoprolol succinate 25 MG 24 hr tablet Commonly known as: TOPROL-XL   midodrine 10 MG tablet Commonly known as: PROAMATINE   multivitamin with minerals tablet       TAKE these medications    (feeding supplement) PROSource Plus liquid Take 30 mLs by mouth 2 (two) times daily between meals.   acetaminophen 325 MG tablet Commonly known as: TYLENOL Take 2 tablets (650 mg total) by mouth every 4 (four) hours as needed for headache or mild pain.   albuterol 108 (90 Base) MCG/ACT inhaler Commonly known as: VENTOLIN HFA Inhale 2 puffs into the lungs every 6 (six) hours as needed for wheezing or shortness of breath.   allopurinol 300 MG tablet Commonly known as: ZYLOPRIM Take 1 tablet (300 mg total) by mouth at bedtime.   apixaban 5 MG Tabs tablet Commonly known as: Eliquis Take 1 tablet (5 mg total) by mouth 2 (two) times daily.   atorvastatin 80 MG  tablet Commonly known as: LIPITOR Take 1 tablet (80 mg total) by mouth at bedtime. What changed: when to take this   cetirizine 10 MG tablet Commonly known as: ZYRTEC Take 10 mg by mouth daily as needed for allergies.   cinacalcet 30 MG tablet Commonly known as: SENSIPAR Take 30 mg by mouth daily with supper.   ferric citrate 1 GM 210 MG(Fe) tablet Commonly known as: AURYXIA Take 420 mg by mouth 3 (three) times daily with meals.   ferrous sulfate 324 MG Tbec Take 324 mg by mouth daily.   gabapentin 100 MG capsule Commonly known as: NEURONTIN Take 1 capsule (100 mg total) by mouth 3 (three) times daily.   lidocaine-prilocaine cream Commonly known as: EMLA Apply 1 application topically See admin instructions. Apply topically to port access one hour prior to dialysis - Monday, Wednesday, Friday, Saturday   metoprolol tartrate 25 MG  tablet Commonly known as: LOPRESSOR Take 0.5 tablets (12.5 mg total) by mouth 2 (two) times daily.   multivitamin Tabs tablet Take 1 tablet by mouth at bedtime.   polyethylene glycol 17 g packet Commonly known as: MIRALAX / GLYCOLAX Take 17 g by mouth daily as needed for moderate constipation.   triamcinolone 0.1% oint-Eucerin equivalent cream 1:1 mixture Apply topically 3 (three) times daily as needed. APPLY TO DRY AFFECTED AREAS AS NEEDED 3 TIMES ADAY What changed:  how much to take reasons to take this additional instructions   vitamin B-12 1000 MCG tablet Commonly known as: CYANOCOBALAMIN Take 1,000 mcg by mouth daily.               Durable Medical Equipment  (From admission, onward)           Start     Ordered   07/29/21 1633  For home use only DME 3 n 1  Once        07/29/21 1635   07/29/21 1631  For home use only DME lightweight manual wheelchair with seat cushion  Once       Comments: Patient suffers from CHF, ESRD, lumbar fracture which impairs their ability to perform daily activities like bathing, dressing, and  toileting in the home.  A cane or walker will not resolve  issue with performing activities of daily living. A wheelchair will allow patient to safely perform daily activities. Patient is not able to propel themselves in the home using a standard weight wheelchair due to general weakness. Patient can self propel in the lightweight wheelchair. Length of need Lifetime. Accessories: elevating leg rests (ELRs), wheel locks, extensions and anti-tippers.   07/29/21 1635   07/29/21 1630  For home use only DME Hospital bed  Once       Question Answer Comment  Length of Need Lifetime   Patient has (list medical condition): ESRD - lumbar fractures - CHF   The above medical condition requires: Patient requires the ability to reposition frequently   Head must be elevated greater than: 30 degrees   Bed type Semi-electric   Hoyer Lift Yes   Trapeze Bar Yes   Support Surface: Gel Overlay      07/29/21 1635              Discharge Care Instructions  (From admission, onward)           Start     Ordered   07/31/21 0000  Discharge wound care:       Comments: Wound dressing per inpatient order for right labia stage II   07/31/21 1424           Allergies  Allergen Reactions   Black Walnut Flavor Hives   Shellfish Allergy Itching    Makes throat itch     Follow-up Information     [facility] Follow up.   Specialty: [place] Why: Agency now goes by [place] - the office will call to schedule home health visits - please allow up to 48 hours for follow up Contact information: [place] [address] [phone]         [facility] Follow up.   Specialty: PALLIATIVE CARE Contact information: [place], [place] [facility] Follow up.   Why: hospital bed, wheelchair, 3n1 arranged- to be delivered to the home Contact information: [place]  [address]  [phone]                  The results of significant diagnostics from this hospitalization (including imaging, microbiology, ancillary and laboratory) are listed below for reference.    Significant Diagnostic Studies: CT Abdomen Pelvis Wo Contrast  Result Date: 07/08/2021 CLINICAL DATA:  Duodenal neuroendocrine tumor resection, numerous liver masses with unknown primary EXAM: CT CHEST, ABDOMEN AND PELVIS WITHOUT CONTRAST TECHNIQUE: Multidetector CT imaging of the chest, abdomen and pelvis was performed following the standard protocol without IV contrast. COMPARISON:  MRI abdomen dated 06/19/2021. CT abdomen/pelvis dated 11/17/2020. CT chest dated 06/10/2020. FINDINGS: Limited evaluation due to lack of intravenous contrast administration. CT CHEST FINDINGS Cardiovascular: Cardiomegaly.  No pericardial effusion. No evidence of thoracic aortic aneurysm. Atherosclerotic calcifications of the aortic arch. Mediastinum/Nodes: No suspicious mediastinal lymphadenopathy. Multiple right axillary nodes measuring up to 12 mm short axis (series 2/image 20). Lungs/Pleura: Small to moderate right pleural effusion, partially loculated. Associated right middle and right lower lobe compressive atelectasis. Multiple scattered small bilateral pulmonary nodules measuring 3-5 mm (for example, series 4/images 30, 34, 56, 62, 63, 74, 76, and 118). These are new from July 2021. No pneumothorax. Musculoskeletal: Asymmetric glandular tissue with a possible underlying 2.6 x 2.3 cm lesion in the right lateral breast (series 2/image 34). Overlying skin thickening. Degenerative changes of the thoracic spine. CT ABDOMEN PELVIS FINDINGS Hepatobiliary: Multifocal hepatic masses throughout both lobes, poorly evaluated on unenhanced CT. Index lesion measures 3.5 cm in the posterior aspect of segment 2 (series 2/image 54). Additional index lesion measures 2.4 cm in segment 6 (series 2/image 71). These are new from December  2021, suggesting metastases. Layering gallstones (series 2/image 71), without associated inflammatory changes. No intrahepatic or extrahepatic duct dilatation. Pancreas: Within normal limits. Spleen: Within normal limits. Adrenals/Urinary Tract: 19 mm left adrenal adenoma (series 2/image 65). Right adrenal gland is within normal limits. 16 mm left lower pole renal cyst (series 2/image 37). Three nonobstructing left renal calculi measuring up to 5 mm in the lower pole (coronal image 93). Two nonobstructing right lower pole renal calculi measuring up to 3 mm (coronal image 90). No hydronephrosis. Bladder is underdistended and poorly evaluated. Stomach/Bowel: Stomach is within normal limits. No evidence of bowel obstruction. Normal appendix (series 2/image 92). No colonic wall thickening or mass is evident on CT. Vascular/Lymphatic: No evidence of abdominal aortic aneurysm. Atherosclerotic calcifications of the abdominal aorta and branch vessels. No suspicious abdominopelvic lymphadenopathy. Reproductive: Status post hysterectomy. No adnexal masses. Other: Small volume pelvic ascites. Musculoskeletal: Degenerative changes of the lumbar spine. Mild to moderate loss of height involving the L4 vertebral body with mild sclerosis in suspected lytic metastasis along the posterior aspect (sagittal image 108), suggesting pathologic fracture. No retropulsion. IMPRESSION: Limited evaluation due to lack of intravenous contrast administration. Possible 2.6 cm right lateral breast lesion, raising concern for primary breast neoplasm. Overlying skin thickening. Associated right axillary lymphadenopathy. Mammographic correlation is suggested. Small bilateral pulmonary nodules measuring up to 5 mm, new from July 2021, raising concern for metastases. Small to moderate right pleural effusion, partially loculated. Multifocal hepatic metastases throughout both lobes, new from December 2021. Mild to moderate pathologic compression fracture  at L4. No retropulsion. Additional ancillary findings as above. Electronically Signed   By: Julian Hy M.D.   On: 07/08/2021 19:50   DG Chest 2 View  Result Date: 07/17/2021 CLINICAL DATA:  Shortness of breath.  Possible lesion. EXAM: CHEST - 2 VIEW COMPARISON:  04/06/2021 FINDINGS: Cardiac enlargement.  No vascular congestion. Moderate right pleural effusion. There is right middle lobe consolidation, likely pneumonia. This is progressing since the previous study. Left lung is clear. No pneumothorax. Calcified and tortuous aorta. Degenerative changes in the spine. IMPRESSION: Right middle lobe consolidation with moderate right pleural effusion. Electronically Signed   By: Lucienne Capers M.D.   On: 07/17/2021 19:29   DG Lumbar Spine Complete  Result Date: 07/09/2021 CLINICAL DATA:  Fall 2 days ago, low back and bilateral hip pain EXAM: LUMBAR SPINE - COMPLETE 4+ VIEW COMPARISON:  CT abdomen/pelvis dated H 10/2021 FINDINGS: Five lumbar-type vertebral bodies. Normal lumbar lordosis. Mild to moderate compression fracture deformity at L4 with associated sclerosis, favoring a pathologic fracture when correlating with recent CT. Otherwise, no evidence of acute fracture or dislocation. Mild multilevel degenerative changes. Visualized bony pelvis appears intact. IMPRESSION: Mild to moderate pathologic compression fracture at L4, better evaluated on recent CT. Electronically Signed   By: Julian Hy M.D.   On: 07/09/2021 19:04   CT Chest Wo Contrast  Result Date: 07/08/2021 CLINICAL DATA:  Duodenal neuroendocrine tumor resection, numerous liver masses with unknown primary EXAM: CT CHEST, ABDOMEN AND PELVIS WITHOUT CONTRAST TECHNIQUE: Multidetector CT imaging of the chest, abdomen and pelvis was performed following the standard protocol without IV contrast. COMPARISON:  MRI abdomen dated 06/19/2021. CT abdomen/pelvis dated 11/17/2020. CT chest dated 06/10/2020. FINDINGS: Limited evaluation due to lack  of intravenous contrast administration. CT CHEST FINDINGS Cardiovascular: Cardiomegaly.  No pericardial effusion. No evidence of thoracic aortic aneurysm. Atherosclerotic calcifications of the aortic arch. Mediastinum/Nodes: No suspicious mediastinal lymphadenopathy. Multiple right axillary nodes measuring up to 12 mm short axis (series 2/image 20). Lungs/Pleura: Small to moderate right pleural effusion, partially loculated. Associated right middle and right lower lobe compressive atelectasis. Multiple scattered small bilateral pulmonary nodules measuring 3-5 mm (for example, series 4/images 30, 34, 56, 62, 63, 74, 76, and 118). These are new from July 2021. No pneumothorax. Musculoskeletal: Asymmetric glandular tissue with a possible underlying 2.6 x 2.3 cm lesion in the right lateral breast (series 2/image 34). Overlying skin thickening. Degenerative changes of the thoracic spine. CT ABDOMEN PELVIS FINDINGS Hepatobiliary: Multifocal hepatic masses throughout both lobes, poorly evaluated on unenhanced CT. Index lesion measures 3.5 cm in the posterior aspect of segment 2 (series 2/image 54). Additional index lesion measures 2.4 cm in segment 6 (series 2/image 71). These are new from December 2021, suggesting metastases. Layering gallstones (series 2/image 71), without associated inflammatory changes. No intrahepatic or extrahepatic duct dilatation. Pancreas: Within normal limits. Spleen: Within normal limits. Adrenals/Urinary Tract: 19 mm left adrenal adenoma (series 2/image 65). Right adrenal gland is within normal limits. 16 mm left lower pole renal cyst (series 2/image 37). Three nonobstructing left renal calculi measuring up to 5 mm in the lower pole (coronal image 93). Two nonobstructing right lower pole renal calculi measuring up to 3 mm (coronal image 90). No hydronephrosis. Bladder is underdistended and poorly evaluated. Stomach/Bowel: Stomach is within normal limits. No evidence of bowel obstruction. Normal  appendix (series 2/image 92). No colonic wall thickening or mass is evident on CT. Vascular/Lymphatic: No evidence of abdominal aortic aneurysm. Atherosclerotic calcifications of the abdominal aorta and branch vessels. No suspicious abdominopelvic lymphadenopathy. Reproductive: Status post hysterectomy. No adnexal masses. Other: Small volume pelvic ascites. Musculoskeletal: Degenerative changes of the lumbar spine. Mild to moderate loss of height involving the L4 vertebral body with mild sclerosis in suspected lytic metastasis along the posterior aspect (sagittal image 108), suggesting pathologic fracture. No  retropulsion. IMPRESSION: Limited evaluation due to lack of intravenous contrast administration. Possible 2.6 cm right lateral breast lesion, raising concern for primary breast neoplasm. Overlying skin thickening. Associated right axillary lymphadenopathy. Mammographic correlation is suggested. Small bilateral pulmonary nodules measuring up to 5 mm, new from July 2021, raising concern for metastases. Small to moderate right pleural effusion, partially loculated. Multifocal hepatic metastases throughout both lobes, new from December 2021. Mild to moderate pathologic compression fracture at L4. No retropulsion. Additional ancillary findings as above. Electronically Signed   By: Julian Hy M.D.   On: 07/08/2021 19:50   MR LUMBAR SPINE WO CONTRAST  Result Date: 07/20/2021 CLINICAL DATA:  Low back pain EXAM: MRI LUMBAR SPINE WITHOUT CONTRAST TECHNIQUE: Multiplanar, multisequence MR imaging of the lumbar spine was performed. No intravenous contrast was administered. COMPARISON:  No prior MRI. FINDINGS: Evaluation is limited by artifact from the degree of edema in the subcutaneous tissues. Segmentation: Standard. Alignment:  Physiologic. Vertebrae: Diffusely decreased T1 and heterogeneously increased T2 signal within the L4 vertebral body and pedicles. Vertebral body height loss, mild bowing of the  posterior cortex inferiorly. Additional smaller T1 hypointense, T2 hyperintense lesions in L2 (series 6, images 7 and 9), L3 (series 6, image 9), L5 (series 6, images 7 and 10), and S1 (series 6, images 6 and 10). Multilevel endplate degenerative changes. Conus medullaris and cauda equina: Conus extends to the L1 level. Conus and cauda equina appear normal. No epidural extension of tumor. Paraspinal and other soft tissues: Multiple T1 and T2 hyperintense lesions in the liver and kidneys. Edema in the subcutaneous tissues. Disc levels: T12-L1: No significant disc bulge. Mild facet arthropathy. No spinal canal stenosis or neural foraminal narrowing. L1-L2: Mild disc bulge. Moderate facet arthropathy. No spinal canal stenosis. No neural foraminal narrowing. L2-L3: Disc height loss with broad-based disc bulge. Moderate severe facet arthropathy and ligamentum flavum hypertrophy. Mild spinal canal stenosis. Moderate right neural foraminal narrowing. L3-L4: Broad-based disc bulge, somewhat eccentric to the right. Moderate facet arthropathy with ligamentum flavum hypertrophy. No spinal canal stenosis. Mild left and severe right neural foraminal narrowing. L4-L5: Mild narrowing of the spinal canal secondary to L4 pathologic compression fracture. Broad-based disc bulge. Severe facet arthropathy. Ligamentum flavum hypertrophy. Severe spinal canal stenosis. Severe bilateral neural foraminal narrowing. L5-S1: Broad-based disc bulge. Severe facet arthropathy. Ligamentum flavum hypertrophy. No spinal canal stenosis. Moderate bilateral neural foraminal narrowing. IMPRESSION: 1. Evaluation is somewhat limited by artifact caused by the degree of edema in the subcutaneous tissues. Within this limitation, diffusely abnormal marrow signal within the L4 vertebral body with vertebral body height loss, consistent with pathologic compression fracture. In addition to pre-existing degenerative changes, the compression fracture adds mild  additional narrowing of the spinal canal. 2. Multiple smaller foci of abnormal signal in L2, L3, L5, and S1, also concerning for metastatic disease. 3. L4-L5 severe spinal canal stenosis and bilateral neural foraminal narrowing, secondary to a combination of pathologic compression fracture and severe degenerative changes. 4. L2-L3 mild spinal canal stenosis and moderate right neural foraminal narrowing. 5. L3-L4 severe right neural foraminal narrowing and L5-S1 moderate bilateral neural foraminal narrowing. Electronically Signed   By: Merilyn Baba M.D.   On: 07/20/2021 12:43   CT Hip Right Wo Contrast  Result Date: 07/10/2021 CLINICAL DATA:  Status post trauma. EXAM: CT OF THE RIGHT HIP WITHOUT CONTRAST TECHNIQUE: Multidetector CT imaging of the right hip was performed according to the standard protocol. Multiplanar CT image reconstructions were also generated. COMPARISON:  None. FINDINGS: Bones/Joint/Cartilage  There is no evidence of acute fracture or dislocation. No lytic or blastic lesions are identified. There is no evidence of cortical destruction or acute periosteal reaction. Degenerative changes are seen in the form joint space narrowing and acetabular sclerosis. Bony spurring is also seen along the lateral aspect of the right acetabulum. Ligaments Suboptimally assessed by CT. Muscles and Tendons Unremarkable. Soft tissues Mild soft tissue swelling and subcutaneous inflammatory fat stranding is seen along the lateral aspect of the right hip. IMPRESSION: Degenerative changes and lateral soft tissue swelling without an acute osseous abnormality. Electronically Signed   By: Virgina Norfolk M.D.   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 Please perform ultrasound-guided liver lesion biopsy for tissue diagnostic purposes. EXAM: ULTRASOUND GUIDED LIVER LESION BIOPSY  COMPARISON:  CT of the chest, abdomen and pelvis-07/06/2021; abdominal MRI-06/19/2021 MEDICATIONS: None ANESTHESIA/SEDATION: Fentanyl 50 mcg IV; Versed 0.5 mg IV Total Moderate Sedation time:  18 Minutes. The patient's level of consciousness and vital signs were monitored continuously by radiology nursing throughout the procedure under my direct supervision. COMPLICATIONS: None immediate. PROCEDURE: Informed written consent was obtained from the patient after a discussion of the risks, benefits and alternatives to treatment. The patient understands and consents the procedure. A timeout was performed prior to the initiation of the procedure. Ultrasound scanning was performed of the right upper abdominal quadrant demonstrates multiple mixed echogenic though predominantly hypoechoic lesions and masses scattered throughout both the right and left lobes of the liver. A dominant approximately 3.1 x 2.9 cm hypoechoic mass within the right lobe of the liver (image 13) correlating with the lesion seen on preceding abdominal CT image 60, series 2 was targeted for biopsy given lesion location and sonographic window. The procedure was planned. The right upper abdominal quadrant was prepped and draped in the usual sterile fashion. The overlying soft tissues were anesthetized with 1% lidocaine with epinephrine. A 17 gauge, 6.8 cm co-axial needle was advanced into a peripheral aspect of the lesion. This was followed by 6 core biopsies with an 18 gauge core device under direct ultrasound guidance. The coaxial needle tract was embolized with a small amount of Gel-Foam slurry and superficial hemostasis was obtained with manual compression. Post procedural scanning was negative for definitive area of hemorrhage or additional complication. A dressing was placed. The patient tolerated the procedure well without immediate post procedural complication. IMPRESSION: Technically successful ultrasound guided core needle biopsy of  indeterminate lesion/mass within the right lobe of the liver. Electronically Signed   By: Sandi Mariscal M.D.   On: 07/09/2021 14:32   IR Bone Tumor(s)RF Ablation  Result Date: 07/24/2021 INDICATION: TAURIEL SCRONCE is a 75 y.o. female past medical history significant for ESRD on HD, A. Fib on Eliquis, CHF, and prior hx of duodenal neuroendocrine tumor. She recently underwent workup for new liver lesions and biopsy performed on 8/15 showed metastatic adenocarcinoma of unknown origin. She was also noted to have pathologic L4 fracture with mild back pain at that time. However, back pain progressed requiring narcotics and hospital admission and resulting in limited mobility. MRI of the lumbar spine was performed on 07/20/2021, confirming pathologic L4 fracture superimposed on severe degenerative changes resulting in severe spinal stenosis at that level. She is not felt to be a good surgical candidate for decompression and, therefore, we were consulted for bone biopsy followed by radiofrequency ablation and kyphoplasty for local disease  control and pain in anticipation to the procedure, patient was transitioned from Eliquis to heparin drip. EXAM: FLUOROSCOPY GUIDED L4 CORE BONE BIOPSY, RADIOFREQUENCY ABLATION AND BALLOON KYPHOPLASTY COMPARISON:  MRI of the lumbar spine July 20, 2021. MEDICATIONS: As antibiotic prophylaxis, Ancef 2 gm IV was ordered pre-procedure and administered intravenously within 1 hour of incision. ANESTHESIA/SEDATION: Moderate (conscious) sedation was employed during this procedure. A total of Versed 1 mg and Fentanyl 25 mcg was administered intravenously. Moderate Sedation Time: 60 minutes. The patient's level of consciousness and vital signs were monitored continuously by radiology nursing throughout the procedure under my direct supervision. FLUOROSCOPY TIME:  Fluoroscopy Time: 21 minutes 54 seconds (3299 mGy) COMPLICATIONS: None immediate. PROCEDURE: Following a full explanation of the  procedure along with the potential associated complications, an informed witnessed consent was obtained. The patient was placed in prone position on the angiography table. The lower lumbar spine was prepped and draped in a sterile fashion. Under fluoroscopy, the L4 vertebral body was delineated and the skin area was marked. The skin was infiltrated with a 1% Lidocaine approximately 3.7 cm lateral to the spinous process projection on the right. Using a 22-gauge spinal needle, the soft issue and the peripedicular space and periosteum were infiltrated with Bupivacaine 0.5%. A skin incision was made at the access site. Subsequently, an 11-gauge Kyphon trocar was inserted under fluoroscopic guidance until contact with the pedicle was obtained. The trocar was inserted under light hammer tapping into the pedicle until the posterior boundaries of the vertebral body was reached. The diamond mandril was removed and one core biopsy was obtained. The skin was infiltrated with a 1% Lidocaine approximately 3.7 cm lateral to the spinous process projection on the left. Using a 22-gauge spinal needle, the soft issue and the peripedicular space and periosteum were infiltrated with Bupivacaine 0.5%. A skin incision was made at the access site. Subsequently, an 11-gauge Kyphon trocar was inserted under fluoroscopic guidance until contact with the pedicle was obtained. The trocar was inserted under light hammer tapping into the pedicle until the posterior boundaries of the vertebral body was reached. The diamond mandril was removed and one core biopsy was obtained. Bone drill was coaxially advanced within the anterior third of each vertebral body for proper radiofrequency probes size selection. An OsteoCool RF probe was inserted bilaterally within the mid-posterior third of the vertebral body. The radiofrequency ablation cycle was performed. Thereafter, the radiofrequency probes were exchanged for inflatable Kyphon balloons, which were  centered within the mid-aspect of the vertebral body. The balloons were inflated to create a void to serve as a repository for the bone cement. Both balloons were deflated and through both cannulas, under continuous fluoroscopy guidance in the AP and lateral views, the vertebral body was filled with previously mixed polymethyl-methacrylate (PMMA) added to barium for opacification. Both cannulas were later removed. The access sites were cleaned and covered with a sterile bandage. IMPRESSION: Successful fluoroscopy-guided bilateral transpedicular approach for L4 vertebral body core bone biopsies, radiofrequency ablation and kyphoplasty for treatment of pathologic fractures. Bone samples obtained were sent for tissue exam. Electronically Signed   By: Pedro Earls M.D.   On: 07/24/2021 13:02   DG CHEST PORT 1 VIEW  Result Date: 07/20/2021 CLINICAL DATA:  Status post right thoracentesis EXAM: PORTABLE CHEST 1 VIEW COMPARISON:  Chest radiograph 07/17/2021 FINDINGS: The heart is enlarged, unchanged. The mediastinal contours are stable. The right pleural effusion has decreased in size following thoracentesis, with improved aeration of the right base.  There is no significant left effusion. There is no new focal airspace disease. There is no appreciable pneumothorax. IMPRESSION: Decreased right pleural effusion with improved aeration of the right base following thoracentesis. No appreciable pneumothorax. Electronically Signed   By: Valetta Mole M.D.   On: 07/20/2021 10:52   IR KYPHO LUMBAR INC FX REDUCE BONE BX UNI/BIL CANNULATION INC/IMAGING  Result Date: 07/24/2021 INDICATION: SYLWIA CUERVO is a 75 y.o. female past medical history significant for ESRD on HD, A. Fib on Eliquis, CHF, and prior hx of duodenal neuroendocrine tumor. She recently underwent workup for new liver lesions and biopsy performed on 8/15 showed metastatic adenocarcinoma of unknown origin. She was also noted to have pathologic  L4 fracture with mild back pain at that time. However, back pain progressed requiring narcotics and hospital admission and resulting in limited mobility. MRI of the lumbar spine was performed on 07/20/2021, confirming pathologic L4 fracture superimposed on severe degenerative changes resulting in severe spinal stenosis at that level. She is not felt to be a good surgical candidate for decompression and, therefore, we were consulted for bone biopsy followed by radiofrequency ablation and kyphoplasty for local disease control and pain in anticipation to the procedure, patient was transitioned from Eliquis to heparin drip. EXAM: FLUOROSCOPY GUIDED L4 CORE BONE BIOPSY, RADIOFREQUENCY ABLATION AND BALLOON KYPHOPLASTY COMPARISON:  MRI of the lumbar spine July 20, 2021. MEDICATIONS: As antibiotic prophylaxis, Ancef 2 gm IV was ordered pre-procedure and administered intravenously within 1 hour of incision. ANESTHESIA/SEDATION: Moderate (conscious) sedation was employed during this procedure. A total of Versed 1 mg and Fentanyl 25 mcg was administered intravenously. Moderate Sedation Time: 60 minutes. The patient's level of consciousness and vital signs were monitored continuously by radiology nursing throughout the procedure under my direct supervision. FLUOROSCOPY TIME:  Fluoroscopy Time: 21 minutes 54 seconds (4970 mGy) COMPLICATIONS: None immediate. PROCEDURE: Following a full explanation of the procedure along with the potential associated complications, an informed witnessed consent was obtained. The patient was placed in prone position on the angiography table. The lower lumbar spine was prepped and draped in a sterile fashion. Under fluoroscopy, the L4 vertebral body was delineated and the skin area was marked. The skin was infiltrated with a 1% Lidocaine approximately 3.7 cm lateral to the spinous process projection on the right. Using a 22-gauge spinal needle, the soft issue and the peripedicular space and  periosteum were infiltrated with Bupivacaine 0.5%. A skin incision was made at the access site. Subsequently, an 11-gauge Kyphon trocar was inserted under fluoroscopic guidance until contact with the pedicle was obtained. The trocar was inserted under light hammer tapping into the pedicle until the posterior boundaries of the vertebral body was reached. The diamond mandril was removed and one core biopsy was obtained. The skin was infiltrated with a 1% Lidocaine approximately 3.7 cm lateral to the spinous process projection on the left. Using a 22-gauge spinal needle, the soft issue and the peripedicular space and periosteum were infiltrated with Bupivacaine 0.5%. A skin incision was made at the access site. Subsequently, an 11-gauge Kyphon trocar was inserted under fluoroscopic guidance until contact with the pedicle was obtained. The trocar was inserted under light hammer tapping into the pedicle until the posterior boundaries of the vertebral body was reached. The diamond mandril was removed and one core biopsy was obtained. Bone drill was coaxially advanced within the anterior third of each vertebral body for proper radiofrequency probes size selection. An OsteoCool RF probe was inserted bilaterally within the mid-posterior  third of the vertebral body. The radiofrequency ablation cycle was performed. Thereafter, the radiofrequency probes were exchanged for inflatable Kyphon balloons, which were centered within the mid-aspect of the vertebral body. The balloons were inflated to create a void to serve as a repository for the bone cement. Both balloons were deflated and through both cannulas, under continuous fluoroscopy guidance in the AP and lateral views, the vertebral body was filled with previously mixed polymethyl-methacrylate (PMMA) added to barium for opacification. Both cannulas were later removed. The access sites were cleaned and covered with a sterile bandage. IMPRESSION: Successful fluoroscopy-guided  bilateral transpedicular approach for L4 vertebral body core bone biopsies, radiofrequency ablation and kyphoplasty for treatment of pathologic fractures. Bone samples obtained were sent for tissue exam. Electronically Signed   By: Pedro Earls M.D.   On: 07/24/2021 13:02   DG HIP UNILAT WITH PELVIS 2-3 VIEWS LEFT  Result Date: 07/09/2021 CLINICAL DATA:  Low back pain bilateral hip pain post fall EXAM: DG HIP (WITH OR WITHOUT PELVIS) 2-3V LEFT COMPARISON:  CT 07/06/2021 FINDINGS: Left pubic rami appear intact. No fracture or malalignment. Mild degenerative changes. IMPRESSION: No acute osseous abnormality Electronically Signed   By: Donavan Foil M.D.   On: 07/09/2021 19:03   DG HIP UNILAT WITH PELVIS 2-3 VIEWS RIGHT  Result Date: 07/09/2021 CLINICAL DATA:  Hip pain post fall EXAM: DG HIP (WITH OR WITHOUT PELVIS) 2-3V RIGHT COMPARISON:  CT 07/06/2021 FINDINGS: SI joints are non widened. Pubic symphysis appears intact. Right femoral head projects in joint. There are mild degenerative changes. IMPRESSION: No definite acute osseous abnormality. Electronically Signed   By: Donavan Foil M.D.   On: 07/09/2021 19:04   IR THORACENTESIS ASP PLEURAL SPACE W/IMG GUIDE  Result Date: 07/20/2021 INDICATION: End-stage renal disease on hemodialysis. Recurrent right pleural effusion. Request for therapeutic thoracentesis. EXAM: ULTRASOUND GUIDED RIGHT THORACENTESIS MEDICATIONS: 1% lidocaine 15 mL COMPLICATIONS: None immediate. PROCEDURE: An ultrasound guided thoracentesis was thoroughly discussed with the patient and questions answered. The benefits, risks, alternatives and complications were also discussed. The patient understands and wishes to proceed with the procedure. Written consent was obtained. Ultrasound was performed to localize and mark an adequate pocket of fluid in the right chest. The area was then prepped and draped in the normal sterile fashion. 1% Lidocaine was used for local  anesthesia. Under ultrasound guidance a 6 Fr Safe-T-Centesis catheter was introduced. Thoracentesis was performed. The catheter was removed and a dressing applied. FINDINGS: A total of approximately 1.2 L of clear amber fluid was removed. IMPRESSION: Successful ultrasound guided right thoracentesis yielding 1.2 L of pleural fluid. No pneumothorax on post-procedure chest x-ray. Read by: Gareth Eagle, PA-C Electronically Signed   By: Aletta Edouard M.D.   On: 07/20/2021 12:18    Microbiology: No results found for this or any previous visit (from the past 240 hour(s)).   Labs: Basic Metabolic Panel: Recent Labs  Lab 07/25/21 0244 07/26/21 0505 07/30/21 1220  NA 128* 132* 124*  K 4.4 3.7 4.0  CL 91* 94* 88*  CO2 26 26 26   GLUCOSE 93 95 147*  BUN 56* 28* 55*  CREATININE 7.50* 5.24* 6.48*  CALCIUM 7.9* 8.1* 8.4*  PHOS  --   --  4.4   Liver Function Tests: Recent Labs  Lab 07/30/21 1220  ALBUMIN 2.5*   No results for input(s): LIPASE, AMYLASE in the last 168 hours. No results for input(s): AMMONIA in the last 168 hours. CBC: Recent Labs  Lab 07/27/21 0242  07/28/21 0016 07/29/21 0151 07/30/21 0336 07/31/21 0135  WBC 7.8 7.5 5.9 7.0 5.5  HGB 9.2* 10.0* 10.2* 10.9* 8.8*  HCT 30.4* 33.1* 33.8* 34.7* 29.6*  MCV 99.0 100.3* 100.6* 98.6 101.7*  PLT 130* 138* 131* 138* 137*   Cardiac Enzymes: No results for input(s): CKTOTAL, CKMB, CKMBINDEX, TROPONINI in the last 168 hours. BNP: BNP (last 3 results) No results for input(s): BNP in the last 8760 hours.  ProBNP (last 3 results) No results for input(s): PROBNP in the last 8760 hours.  CBG: Recent Labs  Lab 07/26/21 1139  GLUCAP 121*       Signed:  Cristal Deer, MD [facility] 07/31/2021, 2:25 PM

## 2021-07-31 NOTE — Plan of Care (Signed)
  Problem: Clinical Measurements: Goal: Will remain free from infection Outcome: Progressing   Problem: Pain Managment: Goal: General experience of comfort will improve Outcome: Progressing   Problem: Safety: Goal: Ability to remain free from injury will improve Outcome: Progressing   Problem: Skin Integrity: Goal: Risk for impaired skin integrity will decrease Outcome: Progressing   

## 2021-07-31 NOTE — TOC Transition Note (Signed)
Transition of Care (TOC) - CM/SW Discharge Note Marvetta Gibbons RN,BSN Transitions of Care Unit 4NP (non trauma) - RN Case Manager See Treatment Team for direct Phone #    Patient Details  Name: LEANNA HAMID MRN: [id number] Date of Birth: 1946/01/05  Transition of [facility]) CM/SW Contact:  Dawayne Patricia, RN Phone Number: 07/31/2021, 4:27 PM   Clinical Narrative:    Pt stable for transition home today, DME- W/C and 3n1 have been delivered to room for home per [facility] bed/lift are pending delivery to the home.   Call made to daughter [name]- per [name] [employer] called over the weekend regarding delivery but she has not heard from them today- f/u done with [employer] and they are to reach out to [facility] regarding delivery- [employer] aware that pt is pending discharge home. HH has been set up with [facility], as well as outpt PC with [facility].   MD has signed paperwork for [facility] PCS services- expedited request.  Paperwork has been faxed to [facility] for the expedited request process. Copy of the paperwork as well as info on setting up Medicaid transportation left in pt's room for daughter.   1500- spoke with daughter again to check DME status- bed still pending delivery. [facility] notified for start of care  1600- call made again to check bed status- per daughter she has heard from [employer] and had expected them shortly after 3pm but they still have not arrived- call made again to [employer] to check on delivery- driver is still out and will go back by to try and deliver. Also discussed transport home with daughter as msg received from PT regarding advising EMS transport home. Daughter is agreeable to EMS transport for safest way to transport home. Daughter states she will plan to come to hospital to pick up the DME that is here at bedside as EMS will not transport it with pt.   1645- [facility] called for transport as per daughter request- address confirmed and pt will go to Unit C-  paperwork as well as GOLD DNR placed on shadow chart- bedside RN updated on plan.       Final next level of care: [place] Barriers to Discharge: Barriers Resolved   Patient Goals and CMS Choice Patient states their goals for this hospitalization and ongoing recovery are:: to get better CMS Medicare.gov Compare Post Acute Care list provided to:: Patient Choice offered to / list presented to : Patient  Discharge Placement               Home w/ [facility]        Discharge Plan and Services In-house Referral: Clinical Social Work Discharge Planning Services: CM Consult Post Acute Care Choice: Durable Medical Equipment, Home Health          DME Arranged: 3-N-1, Hospital bed, Wheelchair manual DME Agency: [employer] Date DME Agency Contacted: 07/31/21 Time DME Agency Contacted: 48 Representative spoke with at DME Agency: Freda Munro HH Arranged: PT, OT, Social Work [employer] Agency: Broderic Bara Date [place]: 07/31/21 Time [place]: 1500 Representative spoke with at [place]: Amy  Social Determinants of Health ([place]) Interventions     Readmission Risk Interventions Readmission Risk Prevention Plan 07/31/2021 06/14/2020  Transportation Screening Complete Complete  Medication Review [employer] photographer) Complete Complete  PCP or Specialist appointment within 3-5 days of discharge Complete Complete  HRI or Home Care Consult Complete Complete  SW Recovery Care/Counseling Consult Complete Complete  Palliative Care Screening Complete Not [place] Patient Refused Not Applicable  Some recent data might be hidden

## 2021-08-01 ENCOUNTER — Telehealth: Payer: Self-pay

## 2021-08-01 NOTE — Discharge Instructions (Signed)
Discharge instructions given to Patient

## 2021-08-01 NOTE — Telephone Encounter (Signed)
Transition Care Management Follow-up Telephone Call  Call completed with patient's daughter, Precious Bard. Date of discharge and from where: 07/31/2021, [facility]  How have you been since you were released from the hospital? Precious Bard explained how her mother just arrived home early this morning.  Precious Bard and her husband are the primary caregivers. The patient lives with Patti's mother in law in the same building as Precious Bard and her husband. Patti lives upstairs and goes back and forth between apartments caring for her mother. Precious Bard also explained that her father in law just passed away and they were at his funeral this morning.  Precious Bard is still trying to determine the best ways to move, position, transfer her mother. Instructed her to take recommendations from the PT/OT about transferring her mother. The therapists can also offer recommendations for any additional DME that is needed and show her how to safely use the DME they already have.  Precious Bard said that she had her mother in the wheelchair today and she made her breakfast for her but her mother said she just wanted to sleep and feels better laying down. The only pain medication she has is tylenol.  Any questions or concerns? Yes  Items Reviewed: Did the pt receive and understand the discharge instructions provided? Yes  Medications obtained and verified?  She said she has all medications but needs to reconcile them. [place] pre-packs meds for her and she just got the medications and now the packages are not correct, because a number of medications have been discontinued, and new medications were ordered from another pharmacy.  Instructed her to contact [place] and inquire if they are able to update the med packages. Also instructed her to call this clinic if she has any questions about the med regime.  Other? No  Any new allergies since your discharge? No  Dietary orders reviewed? Yes Do you have support at home? Yes - noted above  Home Care and  Equipment/Supplies: Were home health services ordered? yes If so, what is the name of the agency? [facility]  Has the agency set up a time to come to the patient's home? Yes -she thinks that the PT is coming tomorrow. Were any new equipment or medical supplies ordered?  Yes: wheelchair, 3:1, RW, hospital bed and lift What is the name of the medical supply agency? [facility]  Were you able to get the supplies/equipment? yes Do you have any questions related to the use of the equipment or supplies? No  HD M/W/F at [facility] @ 0600.  She did not attend today but Patti plans to have her attend as usual on Friday.  Precious Bard has been providing transportation but her mother was more ambulatory prior to her hospitalization. She now will need to call DSS and [facility] about scheduling rides to/from dialysis.  She explained that there is no ramp into the home- there is the threshold and a small step up to the home that they need to work around.   A referral was made to [facility] at discharge.  Precious Bard has not yet heard form [facility].  An expedited referral for PCS was also placed by hospital prior to discharge.   Functional Questionnaire: (I = Independent and D = Dependent) ADLs: dependent. Patient's daughter and her son in law are primary caregivers.    Follow up appointments reviewed:  PCP Hospital f/u appt confirmed? Yes  Scheduled to see [name], NP on 08/16/2021 @ 1450. [facility] f/u appt confirmed? Yes  Scheduled to  see oncology on 08/09/2021 Are transportation arrangements needed?  See note above regarding transportation.  If their condition worsens, is the pt aware to call PCP or go to the Emergency Dept.? Yes Was the patient provided with contact information for the PCP's office or ED? Yes Was to pt encouraged to call back with questions or concerns? Yes

## 2021-08-01 NOTE — Progress Notes (Signed)
Patient was picked up by [facility] at 0310  to be transported. Patient is in stable condition and appropriate for discharge. Daughter [name] informed that patient is on the way home with [place]

## 2021-08-02 ENCOUNTER — Ambulatory Visit: Payer: Medicare Other | Admitting: Radiation Oncology

## 2021-08-02 ENCOUNTER — Telehealth: Payer: Self-pay

## 2021-08-02 ENCOUNTER — Ambulatory Visit: Payer: Medicare Other

## 2021-08-02 ENCOUNTER — Telehealth (INDEPENDENT_AMBULATORY_CARE_PROVIDER_SITE_OTHER): Payer: Self-pay | Admitting: Primary Care

## 2021-08-02 NOTE — Telephone Encounter (Unsigned)
Home Health Verbal Orders - Caller/Agency: Gwinda Passe / [facility]  Callback Number: [phone] vm can be left  Requesting Nursing Eval for wound management and PT with a Frequency: of 2x's a week for 5 weeks and  1x a week for 3 weeks

## 2021-08-02 NOTE — Telephone Encounter (Signed)
Verbals provided to [place].

## 2021-08-02 NOTE — Telephone Encounter (Signed)
Attempted to contact patient's daughter [name] to schedule a Palliative Care consult appointment. No answer left a message to return call.

## 2021-08-03 ENCOUNTER — Telehealth: Payer: Self-pay

## 2021-08-03 ENCOUNTER — Telehealth: Payer: Self-pay | Admitting: *Deleted

## 2021-08-03 NOTE — Telephone Encounter (Signed)
Please call daughter of pt regarding results of recent biopsy. She is very anxious to know the results.  Thank you.

## 2021-08-03 NOTE — Telephone Encounter (Signed)
Spoke with patient's daughter [name] and scheduled an in-person Palliative Consult for 08/14/21 @ 8:30AM with Dr. Hollace Kinnier. Documentation will be noted in [place].   COVID screening was negative. No pets in home. Patient lives with daughter's mother-in-law. Daughter lives in the apartment upstairs.   Consent obtained; updated Outlook/Netsmart/Team List and Epic.   Family is aware they may be receiving a call from provider the day before or day of to confirm appointment.

## 2021-08-07 ENCOUNTER — Telehealth (INDEPENDENT_AMBULATORY_CARE_PROVIDER_SITE_OTHER): Payer: Self-pay | Admitting: Primary Care

## 2021-08-07 ENCOUNTER — Telehealth (INDEPENDENT_AMBULATORY_CARE_PROVIDER_SITE_OTHER): Payer: Self-pay

## 2021-08-07 NOTE — Telephone Encounter (Signed)
Copied from [place] [id number]. 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 Please advise ( can reach out to daughter)

## 2021-08-07 NOTE — Telephone Encounter (Signed)
Home Health Verbal Orders - Caller/Agency: Will Schalla// [place] Number: [phone] secure  Requesting OT/PT/Skilled Nursing/Social Work/Speech Therapy: OT Frequency: 2w3, 1w2

## 2021-08-09 ENCOUNTER — Telehealth: Payer: Self-pay | Admitting: *Deleted

## 2021-08-09 ENCOUNTER — Inpatient Hospital Stay: Payer: Medicare Other | Attending: Hematology and Oncology | Admitting: Hematology and Oncology

## 2021-08-09 ENCOUNTER — Other Ambulatory Visit: Payer: Self-pay

## 2021-08-09 ENCOUNTER — Other Ambulatory Visit: Payer: Self-pay | Admitting: Hematology and Oncology

## 2021-08-09 ENCOUNTER — Encounter: Payer: Self-pay | Admitting: General Practice

## 2021-08-09 ENCOUNTER — Ambulatory Visit
Admit: 2021-08-09 | Discharge: 2021-08-09 | Disposition: A | Discharge status: Home / Self Care | Payer: Medicare Other | Attending: Radiation Oncology | Admitting: Radiation Oncology

## 2021-08-09 ENCOUNTER — Ambulatory Visit
Admission: RE | Admit: 2021-08-09 | Discharge: 2021-08-09 | Disposition: A | Discharge status: Home / Self Care | Payer: Medicare Other | Source: Ambulatory Visit | Attending: Radiation Oncology | Admitting: Radiation Oncology

## 2021-08-09 ENCOUNTER — Inpatient Hospital Stay: Payer: Medicare Other

## 2021-08-09 ENCOUNTER — Encounter: Payer: Self-pay | Admitting: Radiation Oncology

## 2021-08-09 VITALS — BP 101/43 | HR 79 | Temp 97.6°F | Resp 16 | Ht 63.0 in

## 2021-08-09 VITALS — BP 108/64 | HR 83 | Temp 97.0°F | Resp 18 | Ht 63.0 in

## 2021-08-09 DIAGNOSIS — C7951 Secondary malignant neoplasm of bone: Secondary | ICD-10-CM | POA: Insufficient documentation

## 2021-08-09 DIAGNOSIS — N63 Unspecified lump in unspecified breast: Secondary | ICD-10-CM

## 2021-08-09 DIAGNOSIS — R0602 Shortness of breath: Secondary | ICD-10-CM | POA: Diagnosis not present

## 2021-08-09 DIAGNOSIS — Z87891 Personal history of nicotine dependence: Secondary | ICD-10-CM | POA: Diagnosis not present

## 2021-08-09 DIAGNOSIS — D3A8 Other benign neuroendocrine tumors: Secondary | ICD-10-CM | POA: Diagnosis not present

## 2021-08-09 DIAGNOSIS — K769 Liver disease, unspecified: Secondary | ICD-10-CM | POA: Diagnosis not present

## 2021-08-09 DIAGNOSIS — R160 Hepatomegaly, not elsewhere classified: Secondary | ICD-10-CM | POA: Diagnosis not present

## 2021-08-09 DIAGNOSIS — C787 Secondary malignant neoplasm of liver and intrahepatic bile duct: Secondary | ICD-10-CM

## 2021-08-09 LAB — CBC WITH DIFFERENTIAL (CANCER CENTER ONLY)
Abs Immature Granulocytes: 0.03 10*3/uL (ref 0.00–0.07)
Basophils Absolute: 0 10*3/uL (ref 0.0–0.1)
Basophils Relative: 0 %
Eosinophils Absolute: 0 10*3/uL (ref 0.0–0.5)
Eosinophils Relative: 0 %
HCT: 32 % — ABNORMAL LOW (ref 36.0–46.0)
Hemoglobin: 9.7 g/dL — ABNORMAL LOW (ref 12.0–15.0)
Immature Granulocytes: 0 %
Lymphocytes Relative: 10 %
Lymphs Abs: 0.7 10*3/uL (ref 0.7–4.0)
MCH: 30.6 pg (ref 26.0–34.0)
MCHC: 30.3 g/dL (ref 30.0–36.0)
MCV: 100.9 fL — ABNORMAL HIGH (ref 80.0–100.0)
Monocytes Absolute: 0.9 10*3/uL (ref 0.1–1.0)
Monocytes Relative: 14 %
Neutro Abs: 5.2 10*3/uL (ref 1.7–7.7)
Neutrophils Relative %: 76 %
Platelet Count: 155 10*3/uL (ref 150–400)
RBC: 3.17 MIL/uL — ABNORMAL LOW (ref 3.87–5.11)
RDW: 18.1 % — ABNORMAL HIGH (ref 11.5–15.5)
WBC Count: 6.9 10*3/uL (ref 4.0–10.5)
nRBC: 0.3 % — ABNORMAL HIGH (ref 0.0–0.2)

## 2021-08-09 LAB — CMP (CANCER CENTER ONLY)
ALT: 35 U/L (ref 0–44)
AST: 98 U/L — ABNORMAL HIGH (ref 15–41)
Albumin: 3.4 g/dL — ABNORMAL LOW (ref 3.5–5.0)
Alkaline Phosphatase: 415 U/L — ABNORMAL HIGH (ref 38–126)
Anion gap: 13 (ref 5–15)
BUN: 24 mg/dL — ABNORMAL HIGH (ref 8–23)
CO2: 31 mmol/L (ref 22–32)
Calcium: 9.5 mg/dL (ref 8.9–10.3)
Chloride: 95 mmol/L — ABNORMAL LOW (ref 98–111)
Creatinine: 4.68 mg/dL (ref 0.44–1.00)
GFR, Estimated: 9 mL/min — ABNORMAL LOW (ref >60)
Glucose, Bld: 93 mg/dL (ref 70–99)
Potassium: 4.1 mmol/L (ref 3.5–5.1)
Sodium: 139 mmol/L (ref 135–145)
Total Bilirubin: 0.9 mg/dL (ref 0.3–1.2)
Total Protein: 6.5 g/dL (ref 6.5–8.1)

## 2021-08-09 NOTE — Progress Notes (Signed)
[place] Telephone:([phone]) [phone]   Fax:([phone]  PROGRESS NOTE  Patient Care Team: [name], NP as PCP - General (Internal Medicine) [name], MD as PCP - Advanced Heart Failure (Cardiology) [name], MD as PCP - Cardiology (Cardiology) Center, [facility], Verita Lamb, MD as Consulting Physician (Hematology and Oncology)  Hematological/Oncological History # Duodenal Neuroendocrine Tumor s/p Resection # Numerous Lesions of the Liver, Unclear Etiology 11/14/2020: EGD found a nodule in the duodenum. This was biopsied. Final pathology showed a small focus of well differentiated neuroendocrine tumor. Ki 67 <3%.  11/17/2020: CT abdomen w contrast showed no acute abdominal/pelvis abnormality, though a large right sided pleural effusion was noted.  02/22/2021: EGD with EUS showed a submucosal nodule in the duodenum. This was removed via Snare Band Ligation EMR. Pathology confirmed a well differentiated low grade neuroendocrine tumor.  Ki-67 <1%. 04/12/2021: establish care with Dr. Lorenso Courier   Interval History:  Melissa Adams 75 y.o. female with medical history significant for duodenal neuroendocrine tumor status post resection who presents for a follow up visit. The patient's last visit was on 07/27/2021 while she was in the hospital. I had a detailed phone discussion with the patient's daughter on 07/19/2021.   On exam today Melissa Adams is accompanied by her daughter.  She notes that she has been struggling with shortness of breath.  Her oxygen saturations are about 92% on room air.  She notes that she is in a lot of pain with her back but she has been working with PT and OT at home in order to try to get stronger.  She notes that she generally takes Tylenol for the pain.  Her appetite has been quite poor and her weight continues to drop.  She reports she has not been having any issues with nausea, vomiting, or diarrhea.  Full 10 point  ROS is listed below.  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   Cardiomyopathy ([place])    CHF (congestive heart failure) ([place])    Chronic kidney disease    Edema    History of hemodialysis    Hyperlipidemia    Hypertension    Hypotension    on midodrine (as of 06/15/21)   Pulmonary hypertension (HCC)    Recurrent right pleural effusion    As of 06/15/21: s/p thoracenteses 05/05/20, 06/11/20, 08/17/20, 11/18/20, 04/06/21   Sleep apnea    Tricuspid regurgitation     SURGICAL HISTORY: Past Surgical History:  Procedure Laterality Date   A/V FISTULAGRAM Left 05/23/2020   Procedure: A/V FISTULAGRAM;  Surgeon: [name], MD;  Location: [place] [facility];  Service: Cardiovascular;  Laterality: Left;   A/V FISTULAGRAM N/A 12/21/2020   Procedure: A/V FISTULAGRAM - Left Upper Arm;  Surgeon: [name], MD;  Location: [place] [facility];  Service: Cardiovascular;  Laterality: N/A;   ABDOMINAL HYSTERECTOMY     AV FISTULA PLACEMENT Left 11/04/2019   Procedure: Insertion Of Arteriovenous (Av) Gore-Tex Graft Arm;  Surgeon: [name], MD;  Location: [place];  Service: Vascular;  Laterality: Left;   BIOPSY  11/14/2020   Procedure: BIOPSY;  Surgeon: Yetta Flock, MD;  Location: [place];  Service: Gastroenterology;;   BIOPSY  11/16/2020   Procedure: BIOPSY;  Surgeon: Yetta Flock, MD;  Location: [place];  Service: Gastroenterology;;   BIOPSY  02/22/2021   Procedure: BIOPSY;  Surgeon: Irving Copas., MD;  Location: [place];  Service: Gastroenterology;;   CARDIAC CATHETERIZATION     CARDIOVERSION N/A 10/26/2019   Procedure: CARDIOVERSION;  Surgeon: [name], MD;  Location: [facility] END[facility]COPY;  Service: Cardiovascular;  Laterality: N/A;   COLONOSCOPY W/ BIOPSIES AND POLYPECTOMY     COLONOSCOPY WITH PROPOFOL N/A 11/16/2020    Procedure: COLONOSCOPY WITH PROPOFOL;  Surgeon: Yetta Flock, MD;  Location: [place];  Service: Gastroenterology;  Laterality: N/A;   ENDOSCOPIC MUCOSAL RESECTION N/A 02/22/2021   Procedure: ENDOSCOPIC MUCOSAL RESECTION;  Surgeon: Rush Landmark Telford Nab., MD;  Location: [place];  Service: Gastroenterology;  Laterality: N/A;   ESOPHAGOGASTRODUODENOSCOPY (EGD) WITH PROPOFOL Left 11/14/2020   Procedure: ESOPHAGOGASTRODUODENOSCOPY (EGD) WITH PROPOFOL;  Surgeon: Yetta Flock, MD;  Location: [place];  Service: Gastroenterology;  Laterality: Left;   ESOPHAGOGASTRODUODENOSCOPY (EGD) WITH PROPOFOL N/A 02/22/2021   Procedure: ESOPHAGOGASTRODUODENOSCOPY (EGD) WITH PROPOFOL;  Surgeon: Rush Landmark Telford Nab., MD;  Location: [place];  Service: Gastroenterology;  Laterality: N/A;   FISTULA SUPERFICIALIZATION Left 01/21/2020   Procedure: FISTULA SUPERFICIALIZATION OF FISTULA;  Surgeon: [name], MD;  Location: [place];  Service: Vascular;  Laterality: Left;   FISTULA SUPERFICIALIZATION Left 01/01/2535   Procedure: PLICATION OF LEFT UPPER EXTREMITY ARTERIOVENOUS FISTULA;  Surgeon: Waynetta [name], MD;  Location: [place];  Service: Vascular;  Laterality: Left;   GIVENS CAPSULE STUDY N/A 11/16/2020   Procedure: GIVENS CAPSULE STUDY;  Surgeon: Yetta Flock, MD;  Location: [place];  Service: Gastroenterology;  Laterality: N/A;   HEMOSTASIS CLIP PLACEMENT  02/22/2021   Procedure: HEMOSTASIS CLIP PLACEMENT;  Surgeon: Rush Landmark Telford Nab., MD;  Location: [place];  Service: Gastroenterology;;   INSERTION OF DIALYSIS CATHETER Right 11/04/2019   Procedure: CONVERT TEMPORARY DIALYSIS CATHETER TO TUNNELED DIALYSIS CATHETER Right Internal Jugular.;  Surgeon: [name], MD;  Location: [place];  Service: Vascular;  Laterality: Right;   IR BONE TUMOR(S)RF ABLATION  07/23/2021   IR KYPHO LUMBAR INC FX REDUCE BONE BX UNI/BIL CANNULATION INC/IMAGING   07/23/2021   IR THORACENTESIS ASP PLEURAL SPACE W/IMG GUIDE  08/17/2020   IR THORACENTESIS ASP PLEURAL SPACE W/IMG GUIDE  07/20/2021   MULTIPLE TOOTH EXTRACTIONS     POLYPECTOMY  11/16/2020   Procedure: POLYPECTOMY;  Surgeon: Yetta Flock, MD;  Location: [place];  Service: Gastroenterology;;   RADIOLOGY WITH ANESTHESIA N/A 06/19/2021   Procedure: MRI WITH ANESTHESIA  ,LIVER WITH AND WITHOUT CONTRAST;  Surgeon: Radiologist, Medication, MD;  Location: [place];  Service: Radiology;  Laterality: N/A;   RADIOLOGY WITH ANESTHESIA N/A 07/20/2021   Procedure: MRI WITH ANESTHESIA LOWER SPINE WITHOUT CONTRAST;  Surgeon: Radiologist, Medication, MD;  Location: [place];  Service: Radiology;  Laterality: N/A;   REVISION OF ARTERIOVENOUS GORETEX GRAFT Left 01/21/2020   Procedure: REVISION OF ARTERIOVENOUS FISTULA WITH SIDE BRANCH LIGATION;  Surgeon: [name], MD;  Location: [place];  Service: Vascular;  Laterality: Left;   SUBMUCOSAL LIFTING INJECTION  02/22/2021   Procedure: SUBMUCOSAL LIFTING INJECTION;  Surgeon: Irving Copas., MD;  Location: [place];  Service: Gastroenterology;;   SUBMUCOSAL TATTOO INJECTION  02/22/2021   Procedure: SUBMUCOSAL TATTOO INJECTION;  Surgeon: Irving Copas., MD;  Location: [place];  Service: Gastroenterology;;   TEE WITHOUT CARDIOVERSION N/A 10/26/2019   Procedure: TRANSESOPHAGEAL ECHOCARDIOGRAM (TEE);  Surgeon: [name], MD;  Location: [facility] ENDOSCOPY;  Service: Cardiovascular;  Laterality: N/A;   THORACENTESIS  x 2   THORACENTESIS N/A 11/07/2020   Procedure: Mathews Robinsons;  Surgeon: [name], MD;  Location: [facility] ENDOSCOPY;  Service: Pulmonary;  Laterality: N/A;   THORACENTESIS N/A 04/06/2021   Procedure: THORACENTESIS;  Surgeon: Freddi Starr, MD;  Location: [facility] ENDOSCOPY;  Service: Pulmonary;  Laterality: N/A;   UPPER ESOPHAGEAL ENDOSCOPIC ULTRASOUND (EUS) N/A 02/22/2021   Procedure: UPPER ESOPHAGEAL ENDOSCOPIC  ULTRASOUND (EUS);  Surgeon: Irving Copas., MD;  Location: [place];  Service: Gastroenterology;  Laterality: N/A;    SOCIAL HISTORY: Social History   Socioeconomic History   Marital status: Widowed    Spouse name: Not on file   Number of children: Not on file   Years of education: Not on file   Highest education level: Not on file  Occupational History   Not on file  Tobacco Use   Smoking status: Former    Types: Cigarettes   Smokeless tobacco: Never  Vaping Use   Vaping Use: Never used  Substance and Sexual Activity   Alcohol use: Not Currently   Drug use: Not Currently   Sexual activity: Not Currently  Other Topics Concern   Not on file  Social History Narrative   Not on file   Social Determinants of Health   Financial Resource Strain: Low Risk    Difficulty of Paying Living Expenses: Not very hard  Food Insecurity: No Food Insecurity   Worried About Running Out of Food in the Last Year: Never true   Ran Out of Food in the Last Year: Never true  Transportation Needs: No Transportation Needs   Lack of Transportation (Medical): No   Lack of Transportation (Non-Medical): No  Physical Activity: Not on file  Stress: Not on file  Social Connections: Socially Isolated   Frequency of Communication with Friends and Family: Three times a week   Frequency of Social Gatherings with Friends and Family: Three times a week   Attends Religious Services: Never   Active Member of Clubs or Organizations: No   Attends Archivist Meetings: Never   Marital Status: Widowed  Human resources officer Violence: Not on file    FAMILY HISTORY: Family History  Problem Relation Age of Onset   Seizures Father    CAD Father    Diabetes Sister    Lupus Sister    Hypertension Sister    Colon cancer Neg Hx    Esophageal cancer Neg Hx    Pancreatic cancer Neg Hx    Stomach cancer Neg Hx    Inflammatory bowel disease Neg Hx    Liver disease Neg Hx    Rectal cancer Neg Hx      ALLERGIES:  is allergic to black walnut flavor and shellfish allergy.  MEDICATIONS:  Current Outpatient Medications  Medication Sig Dispense Refill   acetaminophen (TYLENOL) 325 MG tablet Take 2 tablets (650 mg total) by mouth every 4 (four) hours as needed for headache or mild pain.     albuterol (VENTOLIN HFA) 108 (90 Base) MCG/ACT inhaler Inhale 2 puffs into the lungs every 6 (six) hours as needed for wheezing or shortness of breath. 6.7 g 1   allopurinol (ZYLOPRIM) 300 MG tablet Take 1 tablet (300 mg total) by mouth at bedtime. 30 tablet 3   apixaban (ELIQUIS) 5 MG TABS tablet Take 1 tablet (5 mg total) by mouth 2 (two) times daily. 60 tablet 3   atorvastatin (LIPITOR) 80 MG tablet Take 1 tablet (80 mg total) by mouth at bedtime. 30 tablet 5  cetirizine (ZYRTEC) 10 MG tablet Take 10 mg by mouth daily as needed for allergies.     cinacalcet (SENSIPAR) 30 MG tablet Take 30 mg by mouth daily with supper.      ferric citrate (AURYXIA) 1 GM 210 MG(Fe) tablet Take 420 mg by mouth 3 (three) times daily with meals.     ferrous sulfate 324 MG TBEC Take 324 mg by mouth daily.     gabapentin (NEURONTIN) 100 MG capsule Take 1 capsule (100 mg total) by mouth 3 (three) times daily. 90 capsule 1   lidocaine-prilocaine (EMLA) cream Apply 1 application topically See admin instructions. Apply topically to port access one hour prior to dialysis - Monday, Wednesday, Friday, Saturday     metoprolol tartrate (LOPRESSOR) 25 MG tablet Take 0.5 tablets (12.5 mg total) by mouth 2 (two) times daily. 60 tablet 0   multivitamin (RENA-VIT) TABS tablet Take 1 tablet by mouth at bedtime. 30 tablet 0   Nutritional Supplements (,FEEDING SUPPLEMENT, PROSOURCE PLUS) liquid Take 30 mLs by mouth 2 (two) times daily between meals.     polyethylene glycol (MIRALAX / GLYCOLAX) 17 g packet Take 17 g by mouth daily as needed for moderate constipation. 14 each 0   triamcinolone 0.1% oint-Eucerin equivalent cream 1:1 mixture  Apply topically 3 (three) times daily as needed. APPLY TO DRY AFFECTED AREAS AS NEEDED 3 TIMES ADAY (Patient taking differently: Apply 1 application topically 3 (three) times daily as needed for irritation, itching or rash.) 360 g 1   vitamin B-12 (CYANOCOBALAMIN) 1000 MCG tablet Take 1,000 mcg by mouth daily.     No current facility-administered medications for this visit.    REVIEW OF SYSTEMS:   Constitutional: ( - ) fevers, ( - )  chills , ( - ) night sweats Eyes: ( - ) blurriness of vision, ( - ) double vision, ( - ) watery eyes Ears, nose, mouth, throat, and face: ( - ) mucositis, ( - ) sore throat Respiratory: ( - ) cough, ( - ) dyspnea, ( - ) wheezes Cardiovascular: ( - ) palpitation, ( - ) chest discomfort, ( - ) lower extremity swelling Gastrointestinal:  ( - ) nausea, ( - ) heartburn, ( - ) change in bowel habits Skin: ( - ) abnormal skin rashes Lymphatics: ( - ) new lymphadenopathy, ( - ) easy bruising Neurological: ( - ) numbness, ( - ) tingling, ( - ) new weaknesses Behavioral/Psych: ( - ) mood change, ( - ) new changes  All other systems were reviewed with the patient and are negative.  PHYSICAL EXAMINATION: ECOG PERFORMANCE STATUS: 2 - Symptomatic, <50% confined to bed  Vitals:   08/09/21 0854  BP: (!) 101/43  Pulse: 79  Resp: 16  Temp: 97.6 F (36.4 C)  SpO2: 92%    Filed Weights     GENERAL: Chronically ill-appearing elderly African-American female, alert, no distress and comfortable SKIN: skin color, texture, turgor are normal, no rashes or significant lesions EYES: conjunctiva are pink and non-injected, sclera clear LUNGS: clear to auscultation and percussion with normal breathing effort HEART: regular rate & rhythm and no murmurs and no lower extremity edema PSYCH: alert & oriented x 3, fluent speech NEURO: no focal motor/sensory deficits  LABORATORY DATA:  I have reviewed the data as listed CBC Latest Ref Rng & Units 08/09/2021 07/31/2021 07/30/2021  WBC  4.0 - 10.5 K/uL 6.9 5.5 7.0  Hemoglobin 12.0 - 15.0 g/dL 9.7(L) 8.8(L) 10.9(L)  Hematocrit 36.0 - 46.0 % 32.0(L)  29.6(L) 34.7(L)  Platelets 150 - 400 K/uL 155 137(L) 138(L)    CMP Latest Ref Rng & Units 08/09/2021 07/30/2021 07/26/2021  Glucose 70 - 99 mg/dL 93 147(H) 95  BUN 8 - 23 mg/dL 24(H) 55(H) 28(H)  Creatinine 0.44 - 1.00 mg/dL 4.68(HH) 6.48(H) 5.24(H)  Sodium 135 - 145 mmol/L 139 124(L) 132(L)  Potassium 3.5 - 5.1 mmol/L 4.1 4.0 3.7  Chloride 98 - 111 mmol/L 95(L) 88(L) 94(L)  CO2 22 - 32 mmol/L _0 Calcium 8.9 - 10.3 mg/dL 9.5 8.4(L) 8.1(L)  Total Protein 6.5 - 8.1 g/dL 6.5 - -  Total Bilirubin 0.3 - 1.2 mg/dL 0.9 - -  Alkaline Phos 38 - 126 U/L 415(H) - -  AST 15 - 41 U/L 98(H) - -  ALT 0 - 44 U/L 35 - -    RADIOGRAPHIC STUDIES: I have personally reviewed the radiological images as listed and agreed with the findings in the report: Numerous lesions of the liver of unclear etiology. IR Bone Tumor(s)RF Ablation  Result Date: 07/24/2021 INDICATION: Melissa Adams is a 75 y.o. female past medical history significant for ESRD on HD, A. Fib on Eliquis, CHF, and prior hx of duodenal neuroendocrine tumor. She recently underwent workup for new liver lesions and biopsy performed on 8/15 showed metastatic adenocarcinoma of unknown origin. She was also noted to have pathologic L4 fracture with mild back pain at that time. However, back pain progressed requiring narcotics and hospital admission and resulting in limited mobility. MRI of the lumbar spine was performed on 07/20/2021, confirming pathologic L4 fracture superimposed on severe degenerative changes resulting in severe spinal stenosis at that level. She is not felt to be a good surgical candidate for decompression and, therefore, we were consulted for bone biopsy followed by radiofrequency ablation and kyphoplasty for local disease control and pain in anticipation to the procedure, patient was transitioned from Eliquis to heparin  drip. EXAM: FLUOROSCOPY GUIDED L4 CORE BONE BIOPSY, RADIOFREQUENCY ABLATION AND BALLOON KYPHOPLASTY COMPARISON:  MRI of the lumbar spine July 20, 2021. MEDICATIONS: As antibiotic prophylaxis, Ancef 2 gm IV was ordered pre-procedure and administered intravenously within 1 hour of incision. ANESTHESIA/SEDATION: Moderate (conscious) sedation was employed during this procedure. A total of Versed 1 mg and Fentanyl 25 mcg was administered intravenously. Moderate Sedation Time: 60 minutes. The patient's level of consciousness and vital signs were monitored continuously by radiology nursing throughout the procedure under my direct supervision. FLUOROSCOPY TIME:  Fluoroscopy Time: 21 minutes 54 seconds (1031 mGy) COMPLICATIONS: None immediate. PROCEDURE: Following a full explanation of the procedure along with the potential associated complications, an informed witnessed consent was obtained. The patient was placed in prone position on the angiography table. The lower lumbar spine was prepped and draped in a sterile fashion. Under fluoroscopy, the L4 vertebral body was delineated and the skin area was marked. The skin was infiltrated with a 1% Lidocaine approximately 3.7 cm lateral to the spinous process projection on the right. Using a 22-gauge spinal needle, the soft issue and the peripedicular space and periosteum were infiltrated with Bupivacaine 0.5%. A skin incision was made at the access site. Subsequently, an 11-gauge Kyphon trocar was inserted under fluoroscopic guidance until contact with the pedicle was obtained. The trocar was inserted under light hammer tapping into the pedicle until the posterior boundaries of the vertebral body was reached. The diamond mandril was removed and one core biopsy was obtained. The skin was infiltrated with a 1% Lidocaine approximately 3.7 cm lateral  to the spinous process projection on the left. Using a 22-gauge spinal needle, the soft issue and the peripedicular space and  periosteum were infiltrated with Bupivacaine 0.5%. A skin incision was made at the access site. Subsequently, an 11-gauge Kyphon trocar was inserted under fluoroscopic guidance until contact with the pedicle was obtained. The trocar was inserted under light hammer tapping into the pedicle until the posterior boundaries of the vertebral body was reached. The diamond mandril was removed and one core biopsy was obtained. Bone drill was coaxially advanced within the anterior third of each vertebral body for proper radiofrequency probes size selection. An OsteoCool RF probe was inserted bilaterally within the mid-posterior third of the vertebral body. The radiofrequency ablation cycle was performed. Thereafter, the radiofrequency probes were exchanged for inflatable Kyphon balloons, which were centered within the mid-aspect of the vertebral body. The balloons were inflated to create a void to serve as a repository for the bone cement. Both balloons were deflated and through both cannulas, under continuous fluoroscopy guidance in the AP and lateral views, the vertebral body was filled with previously mixed polymethyl-methacrylate (PMMA) added to barium for opacification. Both cannulas were later removed. The access sites were cleaned and covered with a sterile bandage. IMPRESSION: Successful fluoroscopy-guided bilateral transpedicular approach for L4 vertebral body core bone biopsies, radiofrequency ablation and kyphoplasty for treatment of pathologic fractures. Bone samples obtained were sent for tissue exam. Electronically Signed   By: Pedro Earls M.D.   On: 07/24/2021 13:02   IR KYPHO LUMBAR INC FX REDUCE BONE BX UNI/BIL CANNULATION INC/IMAGING  Result Date: 07/24/2021 INDICATION: Melissa Adams is a 75 y.o. female past medical history significant for ESRD on HD, A. Fib on Eliquis, CHF, and prior hx of duodenal neuroendocrine tumor. She recently underwent workup for new liver lesions and biopsy  performed on 8/15 showed metastatic adenocarcinoma of unknown origin. She was also noted to have pathologic L4 fracture with mild back pain at that time. However, back pain progressed requiring narcotics and hospital admission and resulting in limited mobility. MRI of the lumbar spine was performed on 07/20/2021, confirming pathologic L4 fracture superimposed on severe degenerative changes resulting in severe spinal stenosis at that level. She is not felt to be a good surgical candidate for decompression and, therefore, we were consulted for bone biopsy followed by radiofrequency ablation and kyphoplasty for local disease control and pain in anticipation to the procedure, patient was transitioned from Eliquis to heparin drip. EXAM: FLUOROSCOPY GUIDED L4 CORE BONE BIOPSY, RADIOFREQUENCY ABLATION AND BALLOON KYPHOPLASTY COMPARISON:  MRI of the lumbar spine July 20, 2021. MEDICATIONS: As antibiotic prophylaxis, Ancef 2 gm IV was ordered pre-procedure and administered intravenously within 1 hour of incision. ANESTHESIA/SEDATION: Moderate (conscious) sedation was employed during this procedure. A total of Versed 1 mg and Fentanyl 25 mcg was administered intravenously. Moderate Sedation Time: 60 minutes. The patient's level of consciousness and vital signs were monitored continuously by radiology nursing throughout the procedure under my direct supervision. FLUOROSCOPY TIME:  Fluoroscopy Time: 21 minutes 54 seconds (5638 mGy) COMPLICATIONS: None immediate. PROCEDURE: Following a full explanation of the procedure along with the potential associated complications, an informed witnessed consent was obtained. The patient was placed in prone position on the angiography table. The lower lumbar spine was prepped and draped in a sterile fashion. Under fluoroscopy, the L4 vertebral body was delineated and the skin area was marked. The skin was infiltrated with a 1% Lidocaine approximately 3.7 cm lateral to the spinous  process  projection on the right. Using a 22-gauge spinal needle, the soft issue and the peripedicular space and periosteum were infiltrated with Bupivacaine 0.5%. A skin incision was made at the access site. Subsequently, an 11-gauge Kyphon trocar was inserted under fluoroscopic guidance until contact with the pedicle was obtained. The trocar was inserted under light hammer tapping into the pedicle until the posterior boundaries of the vertebral body was reached. The diamond mandril was removed and one core biopsy was obtained. The skin was infiltrated with a 1% Lidocaine approximately 3.7 cm lateral to the spinous process projection on the left. Using a 22-gauge spinal needle, the soft issue and the peripedicular space and periosteum were infiltrated with Bupivacaine 0.5%. A skin incision was made at the access site. Subsequently, an 11-gauge Kyphon trocar was inserted under fluoroscopic guidance until contact with the pedicle was obtained. The trocar was inserted under light hammer tapping into the pedicle until the posterior boundaries of the vertebral body was reached. The diamond mandril was removed and one core biopsy was obtained. Bone drill was coaxially advanced within the anterior third of each vertebral body for proper radiofrequency probes size selection. An OsteoCool RF probe was inserted bilaterally within the mid-posterior third of the vertebral body. The radiofrequency ablation cycle was performed. Thereafter, the radiofrequency probes were exchanged for inflatable Kyphon balloons, which were centered within the mid-aspect of the vertebral body. The balloons were inflated to create a void to serve as a repository for the bone cement. Both balloons were deflated and through both cannulas, under continuous fluoroscopy guidance in the AP and lateral views, the vertebral body was filled with previously mixed polymethyl-methacrylate (PMMA) added to barium for opacification. Both cannulas were later removed. The  access sites were cleaned and covered with a sterile bandage. IMPRESSION: Successful fluoroscopy-guided bilateral transpedicular approach for L4 vertebral body core bone biopsies, radiofrequency ablation and kyphoplasty for treatment of pathologic fractures. Bone samples obtained were sent for tissue exam. Electronically Signed   By: Pedro Earls M.D.   On: 07/24/2021 13:02   US BREAST LTD UNI RIGHT INC AXILLA  Result Date: 08/18/2021 CLINICAL DATA:  75 year old female with newly diagnosed metastatic disease within the liver and lumbar spine, with recent pathologic lumbar spine fracture and kyphoplasty. Possible RIGHT breast mass, RIGHT breast skin thickening and enlarged RIGHT axillary lymph nodes identified on 07/06/2021 CT. History of duodenal carcinoid. EXAM: DIGITAL DIAGNOSTIC BILATERAL MAMMOGRAM WITH TOMOSYNTHESIS AND CAD; ULTRASOUND RIGHT BREAST LIMITED TECHNIQUE: Bilateral digital diagnostic mammography and breast tomosynthesis was performed. Only limited CC views could be obtained due to the patient's back pain and inability to position. The images were evaluated with computer-aided detection. COMPARISON:  07/06/2021 CT. 06/23/2018 and prior outside mammograms. ACR Breast Density Category c: The breast tissue is heterogeneously dense, which may obscure small masses. FINDINGS: Full field CC views of both breasts demonstrate asymmetry within the OUTER RIGHT breast. Diffuse RIGHT breast skin thickening is present. No suspicious LEFT breast findings identified. On physical exam, a hard fixed mass within the central/OUTER RIGHT breast identified. Targeted ultrasound is performed, showing a 3.1 x 2.7 x 5.3 cm irregular hypoechoic mass centered at the 9 o'clock position of the RIGHT breast 2 cm from the nipple. RIGHT breast skin thickening is identified. Two RIGHT axillary lymph nodes with cortical thickening are noted. IMPRESSION: 1. 5.3 cm OUTER RIGHT breast mass and 2 abnormal RIGHT  axillary lymph nodes, highly suspicious for breast malignancy and lymph node metastases. RIGHT breast skin thickening  suspicious for skin involvement. 2. No suspicious LEFT breast findings on limited mammogram as described above. RECOMMENDATION: Ultrasound-guided biopsies of the OUTER RIGHT breast mass and 1 of the abnormal RIGHT axillary lymph nodes, which will be scheduled. Consider RIGHT breast skin punch biopsy to determine skin involvement, as clinically indicated. I have discussed the findings and recommendations with the patient. If applicable, a reminder letter will be sent to the patient regarding the next appointment. BI-RADS CATEGORY  5: Highly suggestive of malignancy. Electronically Signed   By: Margarette Canada M.D.   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 Possible RIGHT breast mass, RIGHT breast skin thickening and enlarged RIGHT axillary lymph nodes identified on 07/06/2021 CT. History of duodenal carcinoid. EXAM: DIGITAL DIAGNOSTIC BILATERAL MAMMOGRAM WITH TOMOSYNTHESIS AND CAD; ULTRASOUND RIGHT BREAST LIMITED TECHNIQUE: Bilateral digital diagnostic mammography and breast tomosynthesis was performed. Only limited CC views could be obtained due to the patient's back pain and inability to position. The images were evaluated with computer-aided detection. COMPARISON:  07/06/2021 CT. 06/23/2018 and prior outside mammograms. ACR Breast Density Category c: The breast tissue is heterogeneously dense, which may obscure small masses. FINDINGS: Full field CC views of both breasts demonstrate asymmetry within the OUTER RIGHT breast. Diffuse RIGHT breast skin thickening is present. No suspicious LEFT breast findings identified. On physical exam, a hard fixed mass within the central/OUTER RIGHT breast  identified. Targeted ultrasound is performed, showing a 3.1 x 2.7 x 5.3 cm irregular hypoechoic mass centered at the 9 o'clock position of the RIGHT breast 2 cm from the nipple. RIGHT breast skin thickening is identified. Two RIGHT axillary lymph nodes with cortical thickening are noted. IMPRESSION: 1. 5.3 cm OUTER RIGHT breast mass and 2 abnormal RIGHT axillary lymph nodes, highly suspicious for breast malignancy and lymph node metastases. RIGHT breast skin thickening suspicious for skin involvement. 2. No suspicious LEFT breast findings on limited mammogram as described above. RECOMMENDATION: Ultrasound-guided biopsies of the OUTER RIGHT breast mass and 1 of the abnormal RIGHT axillary lymph nodes, which will be scheduled. Consider RIGHT breast skin punch biopsy to determine skin involvement, as clinically indicated. I have discussed the findings and recommendations with the patient. If applicable, a reminder letter will be sent to the patient regarding the next appointment. BI-RADS CATEGORY  5: Highly suggestive of malignancy. Electronically Signed   By: Margarette Canada M.D.   On: 08/18/2021 08:59    ASSESSMENT & PLAN Melissa Adams 75 y.o. 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  At this time we note the patient has what appears to be metastatic adenocarcinoma of the liver metastasis. We are still working to determine exactly which type of adenocarcinoma.  CT scan of the chest abdomen pelvis are concerning for breast primary with a large breast mass.  We requested a mammogram and ultrasound, however the patient was admitted and we will not bill for these inpatient.  We are arranging for outpatient biopsy. Additionally we agree with palliative radiation to the spine follow her kyphoplasty.  Unfortunately with metastatic adenocarcinoma the patient has a markedly poor prognosis with her  history of ESRD, heart disease and liver disease.  It is reasonable at this time to continue discussions regarding goals of care.  #  Metastatic Poorly Differentiated Adenocarcinoma, Most consistent with Breast Cancer #Lesions on the Liver -- Etiology of these is unclear but are now concerning for metastatic breast cancer given breast mass and pathology consistent with poor differentiated carcinoma.  --outpatient visit with breast center arranged for biopsy.  --RTC pending results of the biopsy.   #Duodenal Neuroendocrine Tumor -- Findings are most consistent with a very small neuroendocrine tumor (less than 1 cm on every side) with a Ki-67 of less than 1% -- Prior CT imaging performed in December shows no evidence of metastatic disease. -- Agree with yearly endoscopies for monitoring of recurrence of his neuroendocrine tumor.  No orders of the defined types were placed in this encounter.   All questions were answered. The patient knows to call the clinic with any problems, questions or concerns.  A total of more than 30 minutes were spent on this encounter with face-to-face time and non-face-to-face time, including preparing to see the patient, ordering tests and/or medications, counseling the patient and coordination of care as outlined above.   Ledell Peoples, MD Department of Hematology/Oncology [place] [facility] Phone: [phone] Pager: [phone] Email: [email or web address]  08/19/2021 1:08 PM

## 2021-08-09 NOTE — Progress Notes (Signed)
Radiation Oncology         [phone] ________________________________  Name: Melissa Adams        MRN: [id number]  Date of Service: 08/09/2021 DOB: [birth year]  YI:[id number], Milford Cage, NP  Terrilee Croak, MD     REFERRING PHYSICIAN: Terrilee Croak, MD   DIAGNOSIS: The primary encounter diagnosis was Metastatic adenocarcinoma to liver [facility]). A diagnosis of Bone metastasis ([place]) was also pertinent to this visit.   HISTORY OF PRESENT ILLNESS: Melissa Adams is a 75 y.o. female seen at the request of Dr. Pietro Cassis with [facility] for painful lumbar spine fracture associated with pathologic features.  The patient is newly established with Dr. Lorenso Courier in medical oncology. 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  She underwent resection of this.  Her work-up at that time showed no evidence of metastatic disease and he recommended continued follow-up to rule out recurrence with endoscopy.  She did not require systemic treatment.  More recently however she returned to Dr. Libby Maw attention after she had undergone an MRI of the abdomen on 06/19/2021 showing innumerable enhancing lesions throughout both lobes of the liver concerning for metastatic disease.  We recommended that she undergo biopsy which showed a small focus of moderate to poorly differentiated adenocarcinoma that was difficult to identify a primary site.  She was counseled on the need for further work-up and underwent CT staging on 07/06/2021 that showed a 2.6 cm right lateral breast mass concerning for possible neoplasm with overlying skin thickening right axillary adenopathy small bilateral pulmonary nodules measuring up to 5 mm new from July 2021 raising concerns for metastatic disease and a small to moderate right pleural effusion she had multifocal hepatic metastases and a mild to moderate pathologic compression fracture at L4 no retropulsion was  identified.  She presented from pulmonary's office while being evaluated for her effusion with intractable back pain, upon arrival imaging of the lumbar spine showed mild to moderate pathologic compression fracture at L4.  I met with her daughter during her hospitalization via telephone and we discussed the findings and work-up.  She did undergo an MRI scan on 07/20/2021 that did confirm abnormal signal at the L4 vertebral body with height loss consistent with pathologic compression fracture multiple follow-up smaller foci of abnormal signal throughout the lumbar spine from L2-L5 and S1 disease was also present.  There was severe canal stenosis and bilateral neural foraminal narrowing, no cord compression was identified.  She did undergo IR vertebral augmentation with radiofrequency ablation and kyphoplasty with cement on 07/23/2021.  Final pathology confirmed metastatic carcinoma but no specific markers confirmed a primary.  ER was negative for IHC.  She was discharged from the hospital and is now home but with the assistance of her daughter who helps her get around.  She does continue her dialysis every Monday Wednesday and Friday and is followed by [facility].  She is seen today to discuss the rationale for palliative radiotherapy.  She met with Dr. Lorenso Courier today and he recommends proceeding with breast biopsies.    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   Cardiomyopathy (HCC)    CHF (congestive heart failure) (HCC)    Chronic kidney disease    Edema  History of hemodialysis    Hyperlipidemia    Hypertension    Hypotension    on midodrine (as of 06/15/21)   Pulmonary hypertension (HCC)    Recurrent right pleural effusion    As of 06/15/21: s/p thoracenteses 05/05/20, 06/11/20, 08/17/20, 11/18/20, 04/06/21   Sleep apnea    Tricuspid  regurgitation        PAST SURGICAL HISTORY: Past Surgical History:  Procedure Laterality Date   A/V FISTULAGRAM Left 05/23/2020   Procedure: A/V FISTULAGRAM;  Surgeon: [name], MD;  Location: [place] [facility];  Service: Cardiovascular;  Laterality: Left;   A/V FISTULAGRAM N/A 12/21/2020   Procedure: A/V FISTULAGRAM - Left Upper Arm;  Surgeon: [name], MD;  Location: [place] [facility];  Service: Cardiovascular;  Laterality: N/A;   ABDOMINAL HYSTERECTOMY     AV FISTULA PLACEMENT Left 11/04/2019   Procedure: Insertion Of Arteriovenous (Av) Gore-Tex Graft Arm;  Surgeon: [name], MD;  Location: [place];  Service: Vascular;  Laterality: Left;   BIOPSY  11/14/2020   Procedure: BIOPSY;  Surgeon: Yetta Flock, MD;  Location: [place];  Service: Gastroenterology;;   BIOPSY  11/16/2020   Procedure: BIOPSY;  Surgeon: Yetta Flock, MD;  Location: [place];  Service: Gastroenterology;;   BIOPSY  02/22/2021   Procedure: BIOPSY;  Surgeon: Irving Copas., MD;  Location: [facility] ENDOSCOPY;  Service: Gastroenterology;;   CARDIAC CATHETERIZATION     CARDIOVERSION N/A 10/26/2019   Procedure: CARDIOVERSION;  Surgeon: [name], MD;  Location: [facility] ENDOSCOPY;  Service: Cardiovascular;  Laterality: N/A;   COLONOSCOPY W/ BIOPSIES AND POLYPECTOMY     COLONOSCOPY WITH PROPOFOL N/A 11/16/2020   Procedure: COLONOSCOPY WITH PROPOFOL;  Surgeon: Yetta Flock, MD;  Location: [place];  Service: Gastroenterology;  Laterality: N/A;   ENDOSCOPIC MUCOSAL RESECTION N/A 02/22/2021   Procedure: ENDOSCOPIC MUCOSAL RESECTION;  Surgeon: Rush Landmark Telford Nab., MD;  Location: [place];  Service: Gastroenterology;  Laterality: N/A;   ESOPHAGOGASTRODUODENOSCOPY (EGD) WITH PROPOFOL Left 11/14/2020   Procedure: ESOPHAGOGASTRODUODENOSCOPY (EGD) WITH PROPOFOL;  Surgeon: Yetta Flock, MD;  Location: [place];  Service: Gastroenterology;   Laterality: Left;   ESOPHAGOGASTRODUODENOSCOPY (EGD) WITH PROPOFOL N/A 02/22/2021   Procedure: ESOPHAGOGASTRODUODENOSCOPY (EGD) WITH PROPOFOL;  Surgeon: Rush Landmark Telford Nab., MD;  Location: [place];  Service: Gastroenterology;  Laterality: N/A;   FISTULA SUPERFICIALIZATION Left 01/21/2020   Procedure: FISTULA SUPERFICIALIZATION OF FISTULA;  Surgeon: [name], MD;  Location: [place];  Service: Vascular;  Laterality: Left;   FISTULA SUPERFICIALIZATION Left 02/23/9621   Procedure: PLICATION OF LEFT UPPER EXTREMITY ARTERIOVENOUS FISTULA;  Surgeon: Waynetta [name], MD;  Location: [place];  Service: Vascular;  Laterality: Left;   GIVENS CAPSULE STUDY N/A 11/16/2020   Procedure: GIVENS CAPSULE STUDY;  Surgeon: Yetta Flock, MD;  Location: [place];  Service: Gastroenterology;  Laterality: N/A;   HEMOSTASIS CLIP PLACEMENT  02/22/2021   Procedure: HEMOSTASIS CLIP PLACEMENT;  Surgeon: Rush Landmark Telford Nab., MD;  Location: [place];  Service: Gastroenterology;;   INSERTION OF DIALYSIS CATHETER Right 11/04/2019   Procedure: CONVERT TEMPORARY DIALYSIS CATHETER TO TUNNELED DIALYSIS CATHETER Right Internal Jugular.;  Surgeon: [name], MD;  Location: [place];  Service: Vascular;  Laterality: Right;   IR BONE TUMOR(S)RF ABLATION  07/23/2021   IR KYPHO LUMBAR INC FX REDUCE BONE BX UNI/BIL CANNULATION INC/IMAGING  07/23/2021   IR THORACENTESIS ASP PLEURAL SPACE W/IMG GUIDE  08/17/2020   IR THORACENTESIS ASP PLEURAL SPACE W/IMG GUIDE  07/20/2021   MULTIPLE TOOTH EXTRACTIONS     POLYPECTOMY  11/16/2020   Procedure: POLYPECTOMY;  Surgeon: Yetta Flock, MD;  Location: [facility] ENDOSCOPY;  Service: Gastroenterology;;   RADIOLOGY WITH ANESTHESIA N/A 06/19/2021   Procedure: MRI WITH ANESTHESIA  ,LIVER WITH AND WITHOUT CONTRAST;  Surgeon: Radiologist, Medication, MD;  Location: [place];  Service: Radiology;  Laterality: N/A;   RADIOLOGY WITH ANESTHESIA N/A 07/20/2021    Procedure: MRI WITH ANESTHESIA LOWER SPINE WITHOUT CONTRAST;  Surgeon: Radiologist, Medication, MD;  Location: [place];  Service: Radiology;  Laterality: N/A;   REVISION OF ARTERIOVENOUS GORETEX GRAFT Left 01/21/2020   Procedure: REVISION OF ARTERIOVENOUS FISTULA WITH SIDE BRANCH LIGATION;  Surgeon: [name], MD;  Location: [place];  Service: Vascular;  Laterality: Left;   SUBMUCOSAL LIFTING INJECTION  02/22/2021   Procedure: SUBMUCOSAL LIFTING INJECTION;  Surgeon: Irving Copas., MD;  Location: [place];  Service: Gastroenterology;;   SUBMUCOSAL TATTOO INJECTION  02/22/2021   Procedure: SUBMUCOSAL TATTOO INJECTION;  Surgeon: Irving Copas., MD;  Location: [place];  Service: Gastroenterology;;   TEE WITHOUT CARDIOVERSION N/A 10/26/2019   Procedure: TRANSESOPHAGEAL ECHOCARDIOGRAM (TEE);  Surgeon: [name], MD;  Location: [facility] ENDOSCOPY;  Service: Cardiovascular;  Laterality: N/A;   THORACENTESIS     x 2   THORACENTESIS N/A 11/07/2020   Procedure: Mathews Robinsons;  Surgeon: [name], MD;  Location: [facility] ENDOSCOPY;  Service: Pulmonary;  Laterality: N/A;   THORACENTESIS N/A 04/06/2021   Procedure: THORACENTESIS;  Surgeon: Freddi Starr, MD;  Location: [facility] ENDOSCOPY;  Service: Pulmonary;  Laterality: N/A;   UPPER ESOPHAGEAL ENDOSCOPIC ULTRASOUND (EUS) N/A 02/22/2021   Procedure: UPPER ESOPHAGEAL ENDOSCOPIC ULTRASOUND (EUS);  Surgeon: Irving Copas., MD;  Location: [place];  Service: Gastroenterology;  Laterality: N/A;     FAMILY HISTORY:  Family History  Problem Relation Age of Onset   Seizures Father    CAD Father    Diabetes Sister    Lupus Sister    Hypertension Sister    Colon cancer Neg Hx    Esophageal cancer Neg Hx    Pancreatic cancer Neg Hx    Stomach cancer Neg Hx    Inflammatory bowel disease Neg Hx    Liver disease Neg Hx    Rectal cancer Neg Hx      SOCIAL HISTORY:  reports that she has quit smoking. Her  smoking use included cigarettes. She has never used smokeless tobacco. She reports that she does not currently use alcohol. She reports that she does not currently use drugs.  The patient is single and lives in [place].  Her daughter Chong Sicilian lives in the upstairs apartment where she lives.  She has the assistance as well of her son-in-law and her son-in-law's mother who also lives in the building.   ALLERGIES: Black walnut flavor and Shellfish allergy   MEDICATIONS:  Current Outpatient Medications  Medication Sig Dispense Refill   acetaminophen (TYLENOL) 325 MG tablet Take 2 tablets (650 mg total) by mouth every 4 (four) hours as needed for headache or mild pain.     albuterol (VENTOLIN HFA) 108 (90 Base) MCG/ACT inhaler Inhale 2 puffs into the lungs every 6 (six) hours as needed for wheezing or shortness of breath. 6.7 g 1   allopurinol (ZYLOPRIM) 300 MG tablet Take 1 tablet (300 mg total) by mouth at bedtime. 30 tablet 3   apixaban (ELIQUIS) 5 MG TABS tablet Take 1 tablet (5 mg total) by mouth 2 (two) times daily.  60 tablet 3   atorvastatin (LIPITOR) 80 MG tablet Take 1 tablet (80 mg total) by mouth at bedtime. 30 tablet 5   cetirizine (ZYRTEC) 10 MG tablet Take 10 mg by mouth daily as needed for allergies.     cinacalcet (SENSIPAR) 30 MG tablet Take 30 mg by mouth daily with supper.      ferric citrate (AURYXIA) 1 GM 210 MG(Fe) tablet Take 420 mg by mouth 3 (three) times daily with meals.     ferrous sulfate 324 MG TBEC Take 324 mg by mouth daily.     gabapentin (NEURONTIN) 100 MG capsule Take 1 capsule (100 mg total) by mouth 3 (three) times daily. 90 capsule 1   lidocaine-prilocaine (EMLA) cream Apply 1 application topically See admin instructions. Apply topically to port access one hour prior to dialysis - Monday, Wednesday, Friday, Saturday     metoprolol tartrate (LOPRESSOR) 25 MG tablet Take 0.5 tablets (12.5 mg total) by mouth 2 (two) times daily. 60 tablet 0   multivitamin (RENA-VIT)  TABS tablet Take 1 tablet by mouth at bedtime. 30 tablet 0   Nutritional Supplements (,FEEDING SUPPLEMENT, PROSOURCE PLUS) liquid Take 30 mLs by mouth 2 (two) times daily between meals.     polyethylene glycol (MIRALAX / GLYCOLAX) 17 g packet Take 17 g by mouth daily as needed for moderate constipation. 14 each 0   triamcinolone 0.1% oint-Eucerin equivalent cream 1:1 mixture Apply topically 3 (three) times daily as needed. APPLY TO DRY AFFECTED AREAS AS NEEDED 3 TIMES ADAY (Patient taking differently: Apply 1 application topically 3 (three) times daily as needed for irritation, itching or rash.) 360 g 1   vitamin B-12 (CYANOCOBALAMIN) 1000 MCG tablet Take 1,000 mcg by mouth daily.     No current facility-administered medications for this encounter.     REVIEW OF SYSTEMS: On review of systems, the patient reports that she is doing fairly well.  She is fatigued but attributes this to her recovery from her hospitalization, trying to regain her strength and also from chronic history of needing dialysis.  No other symptoms of pain is noted today and she really states that she is felt much better since having her kyphoplasty.  No other complaints are verbalized.    PHYSICAL EXAM:  Wt Readings from Last 3 Encounters:  07/30/21 205 lb 11 oz (93.3 kg)  07/09/21 200 lb (90.7 kg)  06/28/21 196 lb 4 oz (89 kg)   Temp Readings from Last 3 Encounters:  08/09/21 (!) 97 F (36.1 C) (Temporal)  08/09/21 97.6 F (36.4 C) (Tympanic)  07/31/21 98.5 F (36.9 C) (Oral)   BP Readings from Last 3 Encounters:  08/09/21 108/64  08/09/21 (!) 101/43  07/31/21 (!) 95/56   Pulse Readings from Last 3 Encounters:  08/09/21 83  08/09/21 79  07/31/21 92   Pain Assessment Pain Score: 5  (RT leg)/10 In general this is a well appearing African-American in no acute distress.  She's alert and oriented x4 and appropriate throughout the examination. Cardiopulmonary assessment is negative for acute distress and she  exhibits normal effort.    ECOG = 1  0 - Asymptomatic (Fully active, able to carry on all predisease activities without restriction)  1 - Symptomatic but completely ambulatory (Restricted in physically strenuous activity but ambulatory and able to carry out work of a light or sedentary nature. For example, light housework, office work)  2 - Symptomatic, <50% in bed during the day (Ambulatory and capable of all self care but  unable to carry out any work activities. Up and about more than 50% of waking hours)  3 - Symptomatic, >50% in bed, but not bedbound (Capable of only limited self-care, confined to bed or chair 50% or more of waking hours)  4 - Bedbound (Completely disabled. Cannot carry on any self-care. Totally confined to bed or chair)  5 - Death   Eustace Pen MM, Creech RH, Tormey DC, et al. [id number]). "Toxicity and response criteria of the [facility]". [place] Oncol. 5 (6): 649-55    LABORATORY DATA:  Lab Results  Component Value Date   WBC 6.9 08/09/2021   HGB 9.7 (L) 08/09/2021   HCT 32.0 (L) 08/09/2021   MCV 100.9 (H) 08/09/2021   PLT 155 08/09/2021   Lab Results  Component Value Date   NA 139 08/09/2021   K 4.1 08/09/2021   CL 95 (L) 08/09/2021   CO2 31 08/09/2021   Lab Results  Component Value Date   ALT 35 08/09/2021   AST 98 (H) 08/09/2021   ALKPHOS 415 (H) 08/09/2021   BILITOT 0.9 08/09/2021      RADIOGRAPHY: DG Chest 2 View  Result Date: 07/17/2021 CLINICAL DATA:  Shortness of breath.  Possible lesion. EXAM: CHEST - 2 VIEW COMPARISON:  04/06/2021 FINDINGS: Cardiac enlargement. No vascular congestion. Moderate right pleural effusion. There is right middle lobe consolidation, likely pneumonia. This is progressing since the previous study. Left lung is clear. No pneumothorax. Calcified and tortuous aorta. Degenerative changes in the spine. IMPRESSION: Right middle lobe consolidation with moderate right pleural effusion. Electronically  Signed   By: Lucienne Capers M.D.   On: 07/17/2021 19:29   MR LUMBAR SPINE WO CONTRAST  Result Date: 07/20/2021 CLINICAL DATA:  Low back pain EXAM: MRI LUMBAR SPINE WITHOUT CONTRAST TECHNIQUE: Multiplanar, multisequence MR imaging of the lumbar spine was performed. No intravenous contrast was administered. COMPARISON:  No prior MRI. FINDINGS: Evaluation is limited by artifact from the degree of edema in the subcutaneous tissues. Segmentation: Standard. Alignment:  Physiologic. Vertebrae: Diffusely decreased T1 and heterogeneously increased T2 signal within the L4 vertebral body and pedicles. Vertebral body height loss, mild bowing of the posterior cortex inferiorly. Additional smaller T1 hypointense, T2 hyperintense lesions in L2 (series 6, images 7 and 9), L3 (series 6, image 9), L5 (series 6, images 7 and 10), and S1 (series 6, images 6 and 10). Multilevel endplate degenerative changes. Conus medullaris and cauda equina: Conus extends to the L1 level. Conus and cauda equina appear normal. No epidural extension of tumor. Paraspinal and other soft tissues: Multiple T1 and T2 hyperintense lesions in the liver and kidneys. Edema in the subcutaneous tissues. Disc levels: T12-L1: No significant disc bulge. Mild facet arthropathy. No spinal canal stenosis or neural foraminal narrowing. L1-L2: Mild disc bulge. Moderate facet arthropathy. No spinal canal stenosis. No neural foraminal narrowing. L2-L3: Disc height loss with broad-based disc bulge. Moderate severe facet arthropathy and ligamentum flavum hypertrophy. Mild spinal canal stenosis. Moderate right neural foraminal narrowing. L3-L4: Broad-based disc bulge, somewhat eccentric to the right. Moderate facet arthropathy with ligamentum flavum hypertrophy. No spinal canal stenosis. Mild left and severe right neural foraminal narrowing. L4-L5: Mild narrowing of the spinal canal secondary to L4 pathologic compression fracture. Broad-based disc bulge. Severe facet  arthropathy. Ligamentum flavum hypertrophy. Severe spinal canal stenosis. Severe bilateral neural foraminal narrowing. L5-S1: Broad-based disc bulge. Severe facet arthropathy. Ligamentum flavum hypertrophy. No spinal canal stenosis. Moderate bilateral neural foraminal narrowing. IMPRESSION: 1. Evaluation is  somewhat limited by artifact caused by the degree of edema in the subcutaneous tissues. Within this limitation, diffusely abnormal marrow signal within the L4 vertebral body with vertebral body height loss, consistent with pathologic compression fracture. In addition to pre-existing degenerative changes, the compression fracture adds mild additional narrowing of the spinal canal. 2. Multiple smaller foci of abnormal signal in L2, L3, L5, and S1, also concerning for metastatic disease. 3. L4-L5 severe spinal canal stenosis and bilateral neural foraminal narrowing, secondary to a combination of pathologic compression fracture and severe degenerative changes. 4. L2-L3 mild spinal canal stenosis and moderate right neural foraminal narrowing. 5. L3-L4 severe right neural foraminal narrowing and L5-S1 moderate bilateral neural foraminal narrowing. Electronically Signed   By: Merilyn Baba M.D.   On: 07/20/2021 12:43   IR Bone Tumor(s)RF Ablation  Result Date: 07/24/2021 INDICATION: SHATON LORE is a 75 y.o. female past medical history significant for ESRD on HD, A. Fib on Eliquis, CHF, and prior hx of duodenal neuroendocrine tumor. She recently underwent workup for new liver lesions and biopsy performed on 8/15 showed metastatic adenocarcinoma of unknown origin. She was also noted to have pathologic L4 fracture with mild back pain at that time. However, back pain progressed requiring narcotics and hospital admission and resulting in limited mobility. MRI of the lumbar spine was performed on 07/20/2021, confirming pathologic L4 fracture superimposed on severe degenerative changes resulting in severe spinal  stenosis at that level. She is not felt to be a good surgical candidate for decompression and, therefore, we were consulted for bone biopsy followed by radiofrequency ablation and kyphoplasty for local disease control and pain in anticipation to the procedure, patient was transitioned from Eliquis to heparin drip. EXAM: FLUOROSCOPY GUIDED L4 CORE BONE BIOPSY, RADIOFREQUENCY ABLATION AND BALLOON KYPHOPLASTY COMPARISON:  MRI of the lumbar spine July 20, 2021. MEDICATIONS: As antibiotic prophylaxis, Ancef 2 gm IV was ordered pre-procedure and administered intravenously within 1 hour of incision. ANESTHESIA/SEDATION: Moderate (conscious) sedation was employed during this procedure. A total of Versed 1 mg and Fentanyl 25 mcg was administered intravenously. Moderate Sedation Time: 60 minutes. The patient's level of consciousness and vital signs were monitored continuously by radiology nursing throughout the procedure under my direct supervision. FLUOROSCOPY TIME:  Fluoroscopy Time: 21 minutes 54 seconds (4098 mGy) COMPLICATIONS: None immediate. PROCEDURE: Following a full explanation of the procedure along with the potential associated complications, an informed witnessed consent was obtained. The patient was placed in prone position on the angiography table. The lower lumbar spine was prepped and draped in a sterile fashion. Under fluoroscopy, the L4 vertebral body was delineated and the skin area was marked. The skin was infiltrated with a 1% Lidocaine approximately 3.7 cm lateral to the spinous process projection on the right. Using a 22-gauge spinal needle, the soft issue and the peripedicular space and periosteum were infiltrated with Bupivacaine 0.5%. A skin incision was made at the access site. Subsequently, an 11-gauge Kyphon trocar was inserted under fluoroscopic guidance until contact with the pedicle was obtained. The trocar was inserted under light hammer tapping into the pedicle until the posterior  boundaries of the vertebral body was reached. The diamond mandril was removed and one core biopsy was obtained. The skin was infiltrated with a 1% Lidocaine approximately 3.7 cm lateral to the spinous process projection on the left. Using a 22-gauge spinal needle, the soft issue and the peripedicular space and periosteum were infiltrated with Bupivacaine 0.5%. A skin incision was made at the access site.  Subsequently, an 11-gauge Kyphon trocar was inserted under fluoroscopic guidance until contact with the pedicle was obtained. The trocar was inserted under light hammer tapping into the pedicle until the posterior boundaries of the vertebral body was reached. The diamond mandril was removed and one core biopsy was obtained. Bone drill was coaxially advanced within the anterior third of each vertebral body for proper radiofrequency probes size selection. An OsteoCool RF probe was inserted bilaterally within the mid-posterior third of the vertebral body. The radiofrequency ablation cycle was performed. Thereafter, the radiofrequency probes were exchanged for inflatable Kyphon balloons, which were centered within the mid-aspect of the vertebral body. The balloons were inflated to create a void to serve as a repository for the bone cement. Both balloons were deflated and through both cannulas, under continuous fluoroscopy guidance in the AP and lateral views, the vertebral body was filled with previously mixed polymethyl-methacrylate (PMMA) added to barium for opacification. Both cannulas were later removed. The access sites were cleaned and covered with a sterile bandage. IMPRESSION: Successful fluoroscopy-guided bilateral transpedicular approach for L4 vertebral body core bone biopsies, radiofrequency ablation and kyphoplasty for treatment of pathologic fractures. Bone samples obtained were sent for tissue exam. Electronically Signed   By: Pedro Earls M.D.   On: 07/24/2021 13:02   DG CHEST PORT 1  VIEW  Result Date: 07/20/2021 CLINICAL DATA:  Status post right thoracentesis EXAM: PORTABLE CHEST 1 VIEW COMPARISON:  Chest radiograph 07/17/2021 FINDINGS: The heart is enlarged, unchanged. The mediastinal contours are stable. The right pleural effusion has decreased in size following thoracentesis, with improved aeration of the right base. There is no significant left effusion. There is no new focal airspace disease. There is no appreciable pneumothorax. IMPRESSION: Decreased right pleural effusion with improved aeration of the right base following thoracentesis. No appreciable pneumothorax. Electronically Signed   By: Valetta Mole M.D.   On: 07/20/2021 10:52   IR KYPHO LUMBAR INC FX REDUCE BONE BX UNI/BIL CANNULATION INC/IMAGING  Result Date: 07/24/2021 INDICATION: AVIELA BLUNDELL is a 75 y.o. female past medical history significant for ESRD on HD, A. Fib on Eliquis, CHF, and prior hx of duodenal neuroendocrine tumor. She recently underwent workup for new liver lesions and biopsy performed on 8/15 showed metastatic adenocarcinoma of unknown origin. She was also noted to have pathologic L4 fracture with mild back pain at that time. However, back pain progressed requiring narcotics and hospital admission and resulting in limited mobility. MRI of the lumbar spine was performed on 07/20/2021, confirming pathologic L4 fracture superimposed on severe degenerative changes resulting in severe spinal stenosis at that level. She is not felt to be a good surgical candidate for decompression and, therefore, we were consulted for bone biopsy followed by radiofrequency ablation and kyphoplasty for local disease control and pain in anticipation to the procedure, patient was transitioned from Eliquis to heparin drip. EXAM: FLUOROSCOPY GUIDED L4 CORE BONE BIOPSY, RADIOFREQUENCY ABLATION AND BALLOON KYPHOPLASTY COMPARISON:  MRI of the lumbar spine July 20, 2021. MEDICATIONS: As antibiotic prophylaxis, Ancef 2 gm IV was  ordered pre-procedure and administered intravenously within 1 hour of incision. ANESTHESIA/SEDATION: Moderate (conscious) sedation was employed during this procedure. A total of Versed 1 mg and Fentanyl 25 mcg was administered intravenously. Moderate Sedation Time: 60 minutes. The patient's level of consciousness and vital signs were monitored continuously by radiology nursing throughout the procedure under my direct supervision. FLUOROSCOPY TIME:  Fluoroscopy Time: 21 minutes 54 seconds (4818 mGy) COMPLICATIONS: None immediate. PROCEDURE: Following a  full explanation of the procedure along with the potential associated complications, an informed witnessed consent was obtained. The patient was placed in prone position on the angiography table. The lower lumbar spine was prepped and draped in a sterile fashion. Under fluoroscopy, the L4 vertebral body was delineated and the skin area was marked. The skin was infiltrated with a 1% Lidocaine approximately 3.7 cm lateral to the spinous process projection on the right. Using a 22-gauge spinal needle, the soft issue and the peripedicular space and periosteum were infiltrated with Bupivacaine 0.5%. A skin incision was made at the access site. Subsequently, an 11-gauge Kyphon trocar was inserted under fluoroscopic guidance until contact with the pedicle was obtained. The trocar was inserted under light hammer tapping into the pedicle until the posterior boundaries of the vertebral body was reached. The diamond mandril was removed and one core biopsy was obtained. The skin was infiltrated with a 1% Lidocaine approximately 3.7 cm lateral to the spinous process projection on the left. Using a 22-gauge spinal needle, the soft issue and the peripedicular space and periosteum were infiltrated with Bupivacaine 0.5%. A skin incision was made at the access site. Subsequently, an 11-gauge Kyphon trocar was inserted under fluoroscopic guidance until contact with the pedicle was  obtained. The trocar was inserted under light hammer tapping into the pedicle until the posterior boundaries of the vertebral body was reached. The diamond mandril was removed and one core biopsy was obtained. Bone drill was coaxially advanced within the anterior third of each vertebral body for proper radiofrequency probes size selection. An OsteoCool RF probe was inserted bilaterally within the mid-posterior third of the vertebral body. The radiofrequency ablation cycle was performed. Thereafter, the radiofrequency probes were exchanged for inflatable Kyphon balloons, which were centered within the mid-aspect of the vertebral body. The balloons were inflated to create a void to serve as a repository for the bone cement. Both balloons were deflated and through both cannulas, under continuous fluoroscopy guidance in the AP and lateral views, the vertebral body was filled with previously mixed polymethyl-methacrylate (PMMA) added to barium for opacification. Both cannulas were later removed. The access sites were cleaned and covered with a sterile bandage. IMPRESSION: Successful fluoroscopy-guided bilateral transpedicular approach for L4 vertebral body core bone biopsies, radiofrequency ablation and kyphoplasty for treatment of pathologic fractures. Bone samples obtained were sent for tissue exam. Electronically Signed   By: Pedro Earls M.D.   On: 07/24/2021 13:02   IR THORACENTESIS ASP PLEURAL SPACE W/IMG GUIDE  Result Date: 07/20/2021 INDICATION: End-stage renal disease on hemodialysis. Recurrent right pleural effusion. Request for therapeutic thoracentesis. EXAM: ULTRASOUND GUIDED RIGHT THORACENTESIS MEDICATIONS: 1% lidocaine 15 mL COMPLICATIONS: None immediate. PROCEDURE: An ultrasound guided thoracentesis was thoroughly discussed with the patient and questions answered. The benefits, risks, alternatives and complications were also discussed. The patient understands and wishes to proceed  with the procedure. Written consent was obtained. Ultrasound was performed to localize and mark an adequate pocket of fluid in the right chest. The area was then prepped and draped in the normal sterile fashion. 1% Lidocaine was used for local anesthesia. Under ultrasound guidance a 6 Fr Safe-T-Centesis catheter was introduced. Thoracentesis was performed. The catheter was removed and a dressing applied. FINDINGS: A total of approximately 1.2 L of clear amber fluid was removed. IMPRESSION: Successful ultrasound guided right thoracentesis yielding 1.2 L of pleural fluid. No pneumothorax on post-procedure chest x-ray. Read by: Gareth Eagle, PA-C Electronically Signed   By: Jenness Corner.D.  On: 07/20/2021 12:18       IMPRESSION/PLAN: 1. Metastatic unknown primary, favor clinical diagnosis of a metastatic breast cancer.  Dr. Lisbeth Renshaw discusses the work-up that the patient has undergone thus far including imaging to date and biopsies that she is undergone.  Unfortunately the tissue has been unable to conclusively document the histology of her tumor by [facility] as well.  Dr. Lorenso Courier plans for her to have local breast work-up with hopefully confirming a diagnosis with biopsy.  If so she would be a candidate for hormone based therapy if this was an estrogen driven cancer.  Otherwise she is not a candidate for systemic chemotherapy due to her renal failure and need for dialysis, her liver function is also a concern.  She is offered a palliative course of radiotherapy to try and improve better local control following her kyphoplasty and osteochondral ablation.  We discussed the risks, benefits, long and short effects of therapy as well as the delivery and logistics of treatment.  Dr. Lisbeth Renshaw offered her a 2-week course of radiotherapy, she will be contacted by simulation to coordinate treatment, therapy does not need to start urgently as she is no longer in pain but does need to begin in the next week or 2.  She is in agreement  with this plan and we will coordinate around her dialysis schedule. 2. Renal failure on hemodialysis.  The patient gets dialyzed Monday Wednesday and Friday from 6 AM until about 10 AM.  We will coordinate her treatments around her schedule for her dialysis.  She is in agreement with this plan.   In a visit lasting 45 minutes, greater than 50% of the time was spent face to face discussing the patient's condition, in preparation for the discussion, and coordinating the patient's care.   The above documentation reflects my direct findings during this shared patient visit. Please see the separate note by Dr. Lisbeth Renshaw on this date for the remainder of the patient's plan of care.    Carola Rhine, [facility]   **Disclaimer: This note was dictated with voice recognition software. Similar sounding words can inadvertently be transcribed and this note may contain transcription errors which may not have been corrected upon publication of note.**

## 2021-08-09 NOTE — Telephone Encounter (Signed)
CRITICAL VALUE STICKER  CRITICAL VALUE: Crt 4.68  RECEIVER (on-site recipient of call): Shelle Iron, RN  DATE & TIME NOTIFIED: 08/09/2021 @9 :82 am  MESSENGER (representative from lab): Ulice Dash  MD NOTIFIED: Dr. Lorenso Courier  TIME OF NOTIFICATION: 09:49am  RESPONSE: No action warranted.  Patient is on dialysis

## 2021-08-09 NOTE — Telephone Encounter (Signed)
Verbals have been provided.

## 2021-08-09 NOTE — Progress Notes (Signed)
Patient reports RT leg pain 5/10, fatigue, and skin irritation (buttocks). No other symptoms reported at this time.  Meaningful use complete.  BP 108/64 (BP Location: Right Arm, Patient Position: Sitting)   Pulse 83   Temp (!) 97 F (36.1 C) (Temporal)   Resp 18   Ht 5\' 3"  (1.6 m)   SpO2 91%   BMI 36.44 kg/m

## 2021-08-09 NOTE — Progress Notes (Signed)
[place] Psychosocial Distress Screening Clinical Social Work  Clinical Social Work was referred by distress screening protocol.  The patient scored a 5 on the Psychosocial Distress Thermometer which indicates moderate distress. Clinical Social Worker contacted patient by phone to assess for distress and other psychosocial needs.  Was able to speak w daughter.  Uses Medicaid transportation services for 3 days/week dialysis services, requires wheelchair transport.  Usually lives w daughter, but daughter lives in upstairs apartment.  Patient cannot get up/down stairs.  Patient will now live w daughter's mother in law who lives downstairs.  Daughter is readily available to help w needs when she is home; however, she does work during the day.  Patient was referred to [place] help while inpatient - nursing assessment and determination of eligibility/hours is scheduled but has not happened to date.  [place] RN has contacted family and is trying to find compatible time to come to assess patient for services.  Patient has had PT, OT and HH SW from [place] come to the house.  Will be getting [employer] post discharge for 30 days.  Does not want to be placed on waiting list for Meals on Wheels.  Primarily supported by daughter, son in law and daughter's mother in law.  Does not have other social connections in the community.  Briefly described resources available through the [facility], encouraged daughter to contact us as needed.     ONCBCN DISTRESS SCREENING 08/09/2021  Distress experienced in past week (1-10) 5  Emotional problem type Adjusting to illness    Clinical Social Worker follow up needed: No.  If yes, follow up plan:  Beverely Pace, [place], LCSW Clinical Social Worker Phone:  [phone]

## 2021-08-14 ENCOUNTER — Telehealth (INDEPENDENT_AMBULATORY_CARE_PROVIDER_SITE_OTHER): Payer: Self-pay | Admitting: Primary Care

## 2021-08-14 NOTE — Telephone Encounter (Signed)
Copied from [place] [id number]. Topic: Quick Communication - Home Health Verbal Orders >> Aug 14, 2021  4:30 PM Leward Quan A wrote: Caller/Agency: Nira Conn // [place] Number: [phone] [place] to [facility]  Requesting OT/PT/Skilled Nursing/Social Work/Speech Therapy: Skilled nursing // Home Health  Frequency: SN 2 w 1 and 1 w 4  Patient have a stage 2 on her sacrum and need to know if barrier cream and foam dressing can be applied. Please  advise

## 2021-08-14 NOTE — Telephone Encounter (Signed)
Sent to PCP ?

## 2021-08-15 ENCOUNTER — Ambulatory Visit
Admission: RE | Admit: 2021-08-15 | Discharge: 2021-08-15 | Disposition: A | Discharge status: Home / Self Care | Payer: Medicare Other | Source: Ambulatory Visit | Attending: Radiation Oncology | Admitting: Radiation Oncology

## 2021-08-15 ENCOUNTER — Other Ambulatory Visit: Payer: Self-pay

## 2021-08-15 DIAGNOSIS — Z510 Encounter for antineoplastic radiation therapy: Secondary | ICD-10-CM | POA: Insufficient documentation

## 2021-08-15 DIAGNOSIS — C7951 Secondary malignant neoplasm of bone: Secondary | ICD-10-CM | POA: Insufficient documentation

## 2021-08-15 DIAGNOSIS — C787 Secondary malignant neoplasm of liver and intrahepatic bile duct: Secondary | ICD-10-CM | POA: Insufficient documentation

## 2021-08-15 DIAGNOSIS — C801 Malignant (primary) neoplasm, unspecified: Secondary | ICD-10-CM | POA: Insufficient documentation

## 2021-08-15 NOTE — Telephone Encounter (Signed)
Melissa Adams is aware that we have received a clear copy of the [facility] request.

## 2021-08-15 NOTE — Telephone Encounter (Signed)
[place] care manager, from  [place] [facility], called states sent form again yesterday, wants to make sure this was received because the first was was unreadable. Please call back

## 2021-08-16 ENCOUNTER — Other Ambulatory Visit: Payer: Self-pay | Admitting: *Deleted

## 2021-08-16 ENCOUNTER — Encounter (INDEPENDENT_AMBULATORY_CARE_PROVIDER_SITE_OTHER): Payer: Self-pay | Admitting: Primary Care

## 2021-08-16 ENCOUNTER — Telehealth: Payer: Self-pay | Admitting: *Deleted

## 2021-08-16 ENCOUNTER — Telehealth (HOSPITAL_COMMUNITY): Payer: Self-pay | Admitting: *Deleted

## 2021-08-16 ENCOUNTER — Telehealth: Payer: Self-pay

## 2021-08-16 ENCOUNTER — Ambulatory Visit (INDEPENDENT_AMBULATORY_CARE_PROVIDER_SITE_OTHER): Payer: Medicare Other | Admitting: Primary Care

## 2021-08-16 VITALS — BP 112/69 | HR 76 | Temp 97.7°F | Ht 63.0 in

## 2021-08-16 DIAGNOSIS — R3981 Functional urinary incontinence: Secondary | ICD-10-CM

## 2021-08-16 DIAGNOSIS — N6310 Unspecified lump in the right breast, unspecified quadrant: Secondary | ICD-10-CM | POA: Insufficient documentation

## 2021-08-16 DIAGNOSIS — R5381 Other malaise: Secondary | ICD-10-CM | POA: Diagnosis not present

## 2021-08-16 DIAGNOSIS — E43 Unspecified severe protein-calorie malnutrition: Secondary | ICD-10-CM | POA: Insufficient documentation

## 2021-08-16 DIAGNOSIS — Z09 Encounter for follow-up examination after completed treatment for conditions other than malignant neoplasm: Secondary | ICD-10-CM | POA: Diagnosis not present

## 2021-08-16 DIAGNOSIS — R159 Full incontinence of feces: Secondary | ICD-10-CM

## 2021-08-16 DIAGNOSIS — R9389 Abnormal findings on diagnostic imaging of other specified body structures: Secondary | ICD-10-CM

## 2021-08-16 DIAGNOSIS — R599 Enlarged lymph nodes, unspecified: Secondary | ICD-10-CM

## 2021-08-16 NOTE — Progress Notes (Signed)
Needs order for shower chair  Please discuss incontinence

## 2021-08-16 NOTE — Progress Notes (Signed)
[facility]   Subjective:   Melissa Adams is a 75 y.o. female presents for hospital follow up . Admit date to the hospital was 07/17/21, patient was discharged from the hospital on 08/01/21, patient was admitted for: Pathologic compression fracture of lumbar vertebra.  Patient was seen by Dr. Mariea Clonts medical director at [facility] a complete exam, orders and DME equipment ordered.  PCP was not aware that we were duplicate the same orders.  Going forward Dr. Mariea Clonts will follow her and if PCP is needed she will contact the office.  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   Cardiomyopathy ([place])    CHF (congestive heart failure) ([place])    Chronic kidney disease    Edema    History of hemodialysis    Hyperlipidemia    Hypertension    Hypotension    on midodrine (as of 06/15/21)   Pulmonary hypertension ([facility])    Recurrent right pleural effusion    As of 06/15/21: s/p thoracenteses 05/05/20, 06/11/20, 08/17/20, 11/18/20, 04/06/21   Sleep apnea    Tricuspid regurgitation      Allergies  Allergen Reactions   Black Walnut Flavor Hives   Shellfish Allergy Itching    Makes throat itch       Current Outpatient Medications on File Prior to Visit  Medication Sig Dispense Refill   acetaminophen (TYLENOL) 325 MG tablet Take 2 tablets (650 mg total) by mouth every 4 (four) hours as needed for headache or mild pain.     albuterol (VENTOLIN HFA) 108 (90 Base) MCG/ACT inhaler Inhale 2 puffs into the lungs every 6 (six) hours as needed for wheezing or shortness of breath. 6.7 g 1   allopurinol (ZYLOPRIM) 300 MG tablet Take 1 tablet (300 mg total) by mouth at bedtime. 30 tablet 3   apixaban (ELIQUIS) 5 MG TABS tablet Take 1 tablet (5 mg total) by mouth 2 (two) times daily. 60 tablet 3   atorvastatin (LIPITOR) 80 MG tablet Take 1 tablet (80 mg total) by mouth at bedtime. 30 tablet 5   cetirizine (ZYRTEC)  10 MG tablet Take 10 mg by mouth daily as needed for allergies.     cinacalcet (SENSIPAR) 30 MG tablet Take 30 mg by mouth daily with supper.      ferric citrate (AURYXIA) 1 GM 210 MG(Fe) tablet Take 420 mg by mouth 3 (three) times daily with meals.     ferrous sulfate 324 MG TBEC Take 324 mg by mouth daily.     gabapentin (NEURONTIN) 100 MG capsule Take 1 capsule (100 mg total) by mouth 3 (three) times daily. 90 capsule 1   lidocaine-prilocaine (EMLA) cream Apply 1 application topically See admin instructions. Apply topically to port access one hour prior to dialysis - Monday, Wednesday, Friday, Saturday     metoprolol tartrate (LOPRESSOR) 25 MG tablet Take 0.5 tablets (12.5 mg total) by mouth 2 (two) times daily. 60 tablet 0   multivitamin (RENA-VIT) TABS tablet Take 1 tablet by mouth at bedtime. 30 tablet 0   Nutritional Supplements (,FEEDING SUPPLEMENT, PROSOURCE PLUS) liquid Take 30 mLs by mouth 2 (two) times daily between meals.     polyethylene glycol (MIRALAX / GLYCOLAX) 17 g packet Take 17 g by mouth daily as needed for moderate constipation. 14 each 0   triamcinolone 0.1% oint-Eucerin equivalent cream 1:1 mixture Apply topically 3 (  three) times daily as needed. APPLY TO DRY AFFECTED AREAS AS NEEDED 3 TIMES ADAY (Patient taking differently: Apply 1 application topically 3 (three) times daily as needed for irritation, itching or rash.) 360 g 1   vitamin B-12 (CYANOCOBALAMIN) 1000 MCG tablet Take 1,000 mcg by mouth daily.     No current facility-administered medications on file prior to visit.     Review of System: Review of Systems  Genitourinary:  Positive for frequency.       Incontinent of bladder and bowel     Objective:  BP 112/69 (BP Location: Right Arm, Patient Position: Sitting, Cuff Size: Normal)   Pulse 76   Temp 97.7 F (36.5 C) (Temporal)   Ht 5\' 3"  (1.6 m)   SpO2 98%   BMI 36.44 kg/m   Filed Weights    Physical Exam: General Appearance: Well nourished, in no  apparent distress. Eyes: PERRLA, EOMs, conjunctiva no swelling or erythema Sinuses: No Frontal/maxillary tenderness ENT/Mouth: Ext aud canals clear, TMs without erythema, bulging. Hard of Hearing at times. Neck: Supple, thyroid normal.  Respiratory: Respiratory effort normal, BS equal bilaterally without rales, rhonchi, wheezing or stridor.  Cardio: RRR with no MRGs.  Bilateral lower extremity edema. Abdomen: Soft, + BS.  Non tender, no guarding, rebound, hernias, masses. Lymphatics: Non tender without lymphadenopathy.  Musculoskeletal: Full ROM, 4/5 strength, abnormal gait.  Skin: Warm, dry without rashes, lesions, ecchymosis.  Neuro: Cranial nerves intact. Normal muscle tone, no cerebellar symptoms. Sensation intact.  Psych: Awake and oriented X 3, normal affect, Assessment:  Melissa Adams was seen today for hospitalization follow-up.  Diagnoses and all orders for this visit:  Physical deconditioning Primary cause she had multifocal hepatic metastases and a mild to moderate pathologic compression fracture at L4 no retropulsion was identified.   Functional urinary incontinence -     For home use only DME Other see comment -     For home use only DME Other see comment  Incontinence of feces, unspecified fecal incontinence type -     For home use only DME Other see comment -     For home use only DME Other see comment   Hospital follow up  Reviewed hospital discharge, oncology encounters, labs and imaging Retrieved from hospital discharge Follow up with [facility] ([place]); Agency now goes by [employer] - the office will call to schedule home health visits - please allow up to 48 hours for follow up Follow up with [facility] (PALLIATIVE CARE) Follow up with [facility]; hospital bed, wheelchair, 3n1 arranged- to be delivered to the home No orders of the defined types were placed in this encounter.   This note has been created with Scientist, clinical (histocompatibility and immunogenetics). Any transcriptional errors are unintentional.   [name], NP 08/19/2021, 9:32 PM

## 2021-08-16 NOTE — Telephone Encounter (Signed)
Per discussion with collaborative nurse and need for biopsy of her noted abnormal right axillary lymphs on CT scan on 07/06/2021.  This RN ordered bx and contacted the [facility] per above- note pt is scheduled for Mammo and [place] on 9/24- Saturday- and biopsies are not performed on Saturdays- but if abnormality noted - order is in and pt will be scheduled for the biopsy.

## 2021-08-16 NOTE — Telephone Encounter (Signed)
Taken care of at visit also seen by Gerontologist Dr. Mariea Clonts same orders done. Spoke to Dr. Mariea Clonts and will not place orders or make any changes until I consult with here

## 2021-08-16 NOTE — Telephone Encounter (Signed)
She can restart Lasix as she was doing prior to hospitalization but will likely need more volume off at dialysis.

## 2021-08-16 NOTE — Patient Instructions (Signed)
Edema  Edema is when you have too much fluid in your body or under your skin. Edema may make your legs, feet, and ankles swell up. Swelling is also common in looser tissues, like around your eyes. This is a common condition. It gets more common as you get older. There are many possible causes of edema. Eating too much salt (sodium) and being on your feet or sitting for a long time can cause edema in yourlegs, feet, and ankles. Hot weather may make edema worse. Edema is usually painless. Your skin may look swollen or shiny. Follow these instructions at home: Keep the swollen body part raised (elevated) above the level of your heart when you are sitting or lying down. Do not sit still or stand for a long time. Do not wear tight clothes. Do not wear garters on your upper legs. Exercise your legs. This can help the swelling go down. Wear elastic bandages or support stockings as told by your doctor. Eat a low-salt (low-sodium) diet to reduce fluid as told by your doctor. Depending on the cause of your swelling, you may need to limit how much fluid you drink (fluid restriction). Take over-the-counter and prescription medicines only as told by your doctor. Contact a doctor if: Treatment is not working. You have heart, liver, or kidney disease and have symptoms of edema. You have sudden and unexplained weight gain. Get help right away if: You have shortness of breath or chest pain. You cannot breathe when you lie down. You have pain, redness, or warmth in the swollen areas. You have heart, liver, or kidney disease and get edema all of a sudden. You have a fever and your symptoms get worse all of a sudden. Summary Edema is when you have too much fluid in your body or under your skin. Edema may make your legs, feet, and ankles swell up. Swelling is also common in looser tissues, like around your eyes. Raise (elevate) the swollen body part above the level of your heart when you are sitting or lying  down. Follow your doctor's instructions about diet and how much fluid you can drink (fluid restriction). This information is not intended to replace advice given to you by your health care provider. Make sure you discuss any questions you have with your healthcare provider. Document Revised: 09/06/2020 Document Reviewed: 09/06/2020 Elsevier Patient Education  2022 Elsevier Inc.  

## 2021-08-16 NOTE — Telephone Encounter (Signed)
Pts daughter called stating pt was hospitalized and when she was discharged they took her off lasix. Pts legs are very swollen and daughter thinks she needs to go back on lasix.    Routed to [place] for advice

## 2021-08-16 NOTE — Telephone Encounter (Addendum)
Message sent to [facility] informing him that an order has been placed for incontinence supplies- briefs and chux.   Patient's daughter was provided with the phone number for [place] for her to call with her concerns/questions about her mother's hospital bed.

## 2021-08-17 NOTE — Telephone Encounter (Signed)
Left vm requesting return call.  

## 2021-08-18 ENCOUNTER — Other Ambulatory Visit: Payer: Medicare Other

## 2021-08-18 ENCOUNTER — Other Ambulatory Visit: Payer: Self-pay | Admitting: Hematology and Oncology

## 2021-08-18 ENCOUNTER — Ambulatory Visit
Admission: RE | Admit: 2021-08-18 | Discharge: 2021-08-18 | Disposition: A | Discharge status: Home / Self Care | Payer: Medicare Other | Source: Ambulatory Visit | Attending: Hematology and Oncology | Admitting: Hematology and Oncology

## 2021-08-18 ENCOUNTER — Other Ambulatory Visit: Payer: Self-pay

## 2021-08-18 DIAGNOSIS — N63 Unspecified lump in unspecified breast: Secondary | ICD-10-CM

## 2021-08-18 DIAGNOSIS — R9389 Abnormal findings on diagnostic imaging of other specified body structures: Secondary | ICD-10-CM

## 2021-08-18 DIAGNOSIS — R599 Enlarged lymph nodes, unspecified: Secondary | ICD-10-CM

## 2021-08-20 ENCOUNTER — Other Ambulatory Visit: Payer: Self-pay | Admitting: Hematology and Oncology

## 2021-08-20 ENCOUNTER — Ambulatory Visit
Admission: RE | Admit: 2021-08-20 | Discharge: 2021-08-20 | Disposition: A | Discharge status: Home / Self Care | Payer: Medicare Other | Source: Ambulatory Visit | Attending: Hematology and Oncology | Admitting: Hematology and Oncology

## 2021-08-20 ENCOUNTER — Other Ambulatory Visit: Payer: Self-pay

## 2021-08-20 DIAGNOSIS — R9389 Abnormal findings on diagnostic imaging of other specified body structures: Secondary | ICD-10-CM

## 2021-08-20 DIAGNOSIS — R599 Enlarged lymph nodes, unspecified: Secondary | ICD-10-CM

## 2021-08-20 MED ORDER — FUROSEMIDE 40 MG PO TABS: ORAL_TABLET | ORAL | 11 refills | Status: DC

## 2021-08-20 NOTE — Telephone Encounter (Signed)
Daughter aware and agreeable with plan.

## 2021-08-21 ENCOUNTER — Other Ambulatory Visit (HOSPITAL_COMMUNITY): Payer: Self-pay | Admitting: *Deleted

## 2021-08-21 DIAGNOSIS — Z510 Encounter for antineoplastic radiation therapy: Secondary | ICD-10-CM | POA: Diagnosis not present

## 2021-08-21 DIAGNOSIS — I5022 Chronic systolic (congestive) heart failure: Secondary | ICD-10-CM

## 2021-08-21 MED ORDER — METOPROLOL TARTRATE 25 MG PO TABS: 12.5000 mg | ORAL_TABLET | Freq: Two times a day (BID) | ORAL | 5 refills | Status: DC

## 2021-08-22 ENCOUNTER — Other Ambulatory Visit: Payer: Self-pay

## 2021-08-22 ENCOUNTER — Ambulatory Visit
Admission: RE | Admit: 2021-08-22 | Discharge: 2021-08-22 | Disposition: A | Discharge status: Home / Self Care | Payer: Medicare Other | Source: Ambulatory Visit | Attending: Radiation Oncology | Admitting: Radiation Oncology

## 2021-08-22 DIAGNOSIS — Z510 Encounter for antineoplastic radiation therapy: Secondary | ICD-10-CM | POA: Diagnosis not present

## 2021-08-23 ENCOUNTER — Ambulatory Visit
Admission: RE | Admit: 2021-08-23 | Discharge: 2021-08-23 | Disposition: A | Discharge status: Home / Self Care | Payer: Medicare Other | Source: Ambulatory Visit | Attending: Radiation Oncology | Admitting: Radiation Oncology

## 2021-08-23 ENCOUNTER — Other Ambulatory Visit: Payer: Self-pay

## 2021-08-23 DIAGNOSIS — Z510 Encounter for antineoplastic radiation therapy: Secondary | ICD-10-CM | POA: Diagnosis not present

## 2021-08-24 ENCOUNTER — Ambulatory Visit
Admission: RE | Admit: 2021-08-24 | Discharge: 2021-08-24 | Disposition: A | Discharge status: Home / Self Care | Payer: Medicare Other | Source: Ambulatory Visit | Attending: Radiation Oncology | Admitting: Radiation Oncology

## 2021-08-24 DIAGNOSIS — Z510 Encounter for antineoplastic radiation therapy: Secondary | ICD-10-CM | POA: Diagnosis not present

## 2021-08-27 ENCOUNTER — Ambulatory Visit
Admission: RE | Admit: 2021-08-27 | Discharge: 2021-08-27 | Disposition: A | Discharge status: Home / Self Care | Payer: Medicare Other | Source: Ambulatory Visit | Attending: Radiation Oncology | Admitting: Radiation Oncology

## 2021-08-27 ENCOUNTER — Other Ambulatory Visit: Payer: Self-pay

## 2021-08-27 DIAGNOSIS — Z510 Encounter for antineoplastic radiation therapy: Secondary | ICD-10-CM | POA: Insufficient documentation

## 2021-08-27 DIAGNOSIS — C801 Malignant (primary) neoplasm, unspecified: Secondary | ICD-10-CM | POA: Insufficient documentation

## 2021-08-27 DIAGNOSIS — C787 Secondary malignant neoplasm of liver and intrahepatic bile duct: Secondary | ICD-10-CM | POA: Insufficient documentation

## 2021-08-27 DIAGNOSIS — C7951 Secondary malignant neoplasm of bone: Secondary | ICD-10-CM | POA: Insufficient documentation

## 2021-08-28 ENCOUNTER — Ambulatory Visit
Admission: RE | Admit: 2021-08-28 | Discharge: 2021-08-28 | Disposition: A | Discharge status: Home / Self Care | Payer: Medicare Other | Source: Ambulatory Visit | Attending: Hematology and Oncology | Admitting: Hematology and Oncology

## 2021-08-28 ENCOUNTER — Other Ambulatory Visit (INDEPENDENT_AMBULATORY_CARE_PROVIDER_SITE_OTHER): Payer: Self-pay | Admitting: Primary Care

## 2021-08-28 ENCOUNTER — Ambulatory Visit
Admission: RE | Admit: 2021-08-28 | Discharge: 2021-08-28 | Disposition: A | Discharge status: Home / Self Care | Payer: Medicare Other | Source: Ambulatory Visit | Attending: Radiation Oncology | Admitting: Radiation Oncology

## 2021-08-28 DIAGNOSIS — N186 End stage renal disease: Secondary | ICD-10-CM

## 2021-08-28 DIAGNOSIS — Z510 Encounter for antineoplastic radiation therapy: Secondary | ICD-10-CM | POA: Diagnosis not present

## 2021-08-29 ENCOUNTER — Other Ambulatory Visit: Payer: Self-pay

## 2021-08-29 ENCOUNTER — Ambulatory Visit
Admission: RE | Admit: 2021-08-29 | Discharge: 2021-08-29 | Disposition: A | Discharge status: Home / Self Care | Payer: Medicare Other | Source: Ambulatory Visit | Attending: Radiation Oncology | Admitting: Radiation Oncology

## 2021-08-29 DIAGNOSIS — Z510 Encounter for antineoplastic radiation therapy: Secondary | ICD-10-CM | POA: Diagnosis not present

## 2021-08-30 ENCOUNTER — Other Ambulatory Visit: Payer: Medicare Other

## 2021-08-30 ENCOUNTER — Telehealth: Payer: Self-pay

## 2021-08-30 ENCOUNTER — Ambulatory Visit
Admission: RE | Admit: 2021-08-30 | Discharge: 2021-08-30 | Disposition: A | Discharge status: Home / Self Care | Payer: Medicare Other | Source: Ambulatory Visit | Attending: Radiation Oncology | Admitting: Radiation Oncology

## 2021-08-30 ENCOUNTER — Other Ambulatory Visit: Payer: Self-pay

## 2021-08-30 DIAGNOSIS — Z510 Encounter for antineoplastic radiation therapy: Secondary | ICD-10-CM | POA: Diagnosis not present

## 2021-08-30 NOTE — Telephone Encounter (Signed)
Order and documentation faxed to [employer] for DME.

## 2021-08-31 ENCOUNTER — Ambulatory Visit
Admission: RE | Admit: 2021-08-31 | Discharge: 2021-08-31 | Disposition: A | Discharge status: Home / Self Care | Payer: Medicare Other | Source: Ambulatory Visit | Attending: Radiation Oncology | Admitting: Radiation Oncology

## 2021-08-31 DIAGNOSIS — Z510 Encounter for antineoplastic radiation therapy: Secondary | ICD-10-CM | POA: Diagnosis not present

## 2021-09-03 ENCOUNTER — Other Ambulatory Visit: Payer: Self-pay

## 2021-09-03 ENCOUNTER — Ambulatory Visit
Admission: RE | Admit: 2021-09-03 | Discharge: 2021-09-03 | Disposition: A | Discharge status: Home / Self Care | Payer: Medicare Other | Source: Ambulatory Visit | Attending: Radiation Oncology | Admitting: Radiation Oncology

## 2021-09-03 DIAGNOSIS — I4891 Unspecified atrial fibrillation: Secondary | ICD-10-CM | POA: Diagnosis not present

## 2021-09-03 DIAGNOSIS — I482 Chronic atrial fibrillation, unspecified: Secondary | ICD-10-CM | POA: Diagnosis not present

## 2021-09-04 ENCOUNTER — Encounter: Payer: Self-pay | Admitting: Radiation Oncology

## 2021-09-04 ENCOUNTER — Telehealth: Payer: Self-pay

## 2021-09-04 ENCOUNTER — Other Ambulatory Visit: Payer: Self-pay

## 2021-09-04 ENCOUNTER — Ambulatory Visit
Admission: RE | Admit: 2021-09-04 | Discharge: 2021-09-04 | Disposition: A | Discharge status: Home / Self Care | Payer: Medicare Other | Source: Ambulatory Visit | Attending: Radiation Oncology | Admitting: Radiation Oncology

## 2021-09-04 DIAGNOSIS — Z510 Encounter for antineoplastic radiation therapy: Secondary | ICD-10-CM | POA: Diagnosis not present

## 2021-09-04 DIAGNOSIS — C801 Malignant (primary) neoplasm, unspecified: Secondary | ICD-10-CM | POA: Diagnosis present

## 2021-09-04 DIAGNOSIS — C7951 Secondary malignant neoplasm of bone: Secondary | ICD-10-CM | POA: Diagnosis present

## 2021-09-04 DIAGNOSIS — C787 Secondary malignant neoplasm of liver and intrahepatic bile duct: Secondary | ICD-10-CM | POA: Diagnosis not present

## 2021-09-04 NOTE — Telephone Encounter (Signed)
RN returned call to daughter.  She is requesting gel overlay or air mattress for hospital bed and gel overlay for wheelchair due to increased pressure and wounds that home health is managing. RN faxed MD order, documentation and latest note to [employer] [facility]. Notified MD of same

## 2021-09-05 ENCOUNTER — Emergency Department (HOSPITAL_COMMUNITY): Payer: Medicare Other

## 2021-09-05 ENCOUNTER — Ambulatory Visit: Payer: Medicare Other

## 2021-09-05 ENCOUNTER — Inpatient Hospital Stay (HOSPITAL_COMMUNITY)
Admission: EM | Admit: 2021-09-05 | Discharge: 2021-09-07 | DRG: 308 | Disposition: A | Discharge status: Hospice | Payer: Medicare Other | Attending: Internal Medicine | Admitting: Internal Medicine

## 2021-09-05 ENCOUNTER — Encounter (HOSPITAL_COMMUNITY): Payer: Self-pay | Admitting: Emergency Medicine

## 2021-09-05 DIAGNOSIS — N179 Acute kidney failure, unspecified: Secondary | ICD-10-CM | POA: Diagnosis present

## 2021-09-05 DIAGNOSIS — Z7901 Long term (current) use of anticoagulants: Secondary | ICD-10-CM

## 2021-09-05 DIAGNOSIS — D696 Thrombocytopenia, unspecified: Secondary | ICD-10-CM | POA: Diagnosis present

## 2021-09-05 DIAGNOSIS — R180 Malignant ascites: Secondary | ICD-10-CM | POA: Diagnosis present

## 2021-09-05 DIAGNOSIS — D631 Anemia in chronic kidney disease: Secondary | ICD-10-CM | POA: Diagnosis present

## 2021-09-05 DIAGNOSIS — G4733 Obstructive sleep apnea (adult) (pediatric): Secondary | ICD-10-CM | POA: Diagnosis present

## 2021-09-05 DIAGNOSIS — C50919 Malignant neoplasm of unspecified site of unspecified female breast: Secondary | ICD-10-CM | POA: Diagnosis not present

## 2021-09-05 DIAGNOSIS — Z66 Do not resuscitate: Secondary | ICD-10-CM | POA: Diagnosis present

## 2021-09-05 DIAGNOSIS — J9621 Acute and chronic respiratory failure with hypoxia: Secondary | ICD-10-CM | POA: Diagnosis present

## 2021-09-05 DIAGNOSIS — J9620 Acute and chronic respiratory failure, unspecified whether with hypoxia or hypercapnia: Secondary | ICD-10-CM

## 2021-09-05 DIAGNOSIS — N2581 Secondary hyperparathyroidism of renal origin: Secondary | ICD-10-CM | POA: Diagnosis present

## 2021-09-05 DIAGNOSIS — K5900 Constipation, unspecified: Secondary | ICD-10-CM | POA: Diagnosis present

## 2021-09-05 DIAGNOSIS — N186 End stage renal disease: Secondary | ICD-10-CM | POA: Diagnosis present

## 2021-09-05 DIAGNOSIS — I5023 Acute on chronic systolic (congestive) heart failure: Secondary | ICD-10-CM | POA: Diagnosis present

## 2021-09-05 DIAGNOSIS — C787 Secondary malignant neoplasm of liver and intrahepatic bile duct: Secondary | ICD-10-CM | POA: Diagnosis present

## 2021-09-05 DIAGNOSIS — E1122 Type 2 diabetes mellitus with diabetic chronic kidney disease: Secondary | ICD-10-CM | POA: Diagnosis present

## 2021-09-05 DIAGNOSIS — I428 Other cardiomyopathies: Secondary | ICD-10-CM | POA: Diagnosis present

## 2021-09-05 DIAGNOSIS — E6609 Other obesity due to excess calories: Secondary | ICD-10-CM | POA: Diagnosis present

## 2021-09-05 DIAGNOSIS — Z8673 Personal history of transient ischemic attack (TIA), and cerebral infarction without residual deficits: Secondary | ICD-10-CM

## 2021-09-05 DIAGNOSIS — Z20822 Contact with and (suspected) exposure to covid-19: Secondary | ICD-10-CM | POA: Diagnosis present

## 2021-09-05 DIAGNOSIS — R609 Edema, unspecified: Secondary | ICD-10-CM | POA: Diagnosis not present

## 2021-09-05 DIAGNOSIS — I4891 Unspecified atrial fibrillation: Secondary | ICD-10-CM | POA: Diagnosis present

## 2021-09-05 DIAGNOSIS — R7989 Other specified abnormal findings of blood chemistry: Secondary | ICD-10-CM

## 2021-09-05 DIAGNOSIS — C7951 Secondary malignant neoplasm of bone: Secondary | ICD-10-CM | POA: Diagnosis present

## 2021-09-05 DIAGNOSIS — Z79899 Other long term (current) drug therapy: Secondary | ICD-10-CM

## 2021-09-05 DIAGNOSIS — Z992 Dependence on renal dialysis: Secondary | ICD-10-CM

## 2021-09-05 DIAGNOSIS — C799 Secondary malignant neoplasm of unspecified site: Secondary | ICD-10-CM

## 2021-09-05 DIAGNOSIS — D3A8 Other benign neuroendocrine tumors: Secondary | ICD-10-CM | POA: Diagnosis not present

## 2021-09-05 DIAGNOSIS — I953 Hypotension of hemodialysis: Secondary | ICD-10-CM | POA: Diagnosis not present

## 2021-09-05 DIAGNOSIS — I248 Other forms of acute ischemic heart disease: Secondary | ICD-10-CM | POA: Diagnosis present

## 2021-09-05 DIAGNOSIS — J910 Malignant pleural effusion: Secondary | ICD-10-CM | POA: Diagnosis present

## 2021-09-05 DIAGNOSIS — I482 Chronic atrial fibrillation, unspecified: Principal | ICD-10-CM | POA: Diagnosis present

## 2021-09-05 DIAGNOSIS — I5022 Chronic systolic (congestive) heart failure: Secondary | ICD-10-CM

## 2021-09-05 DIAGNOSIS — I272 Pulmonary hypertension, unspecified: Secondary | ICD-10-CM | POA: Diagnosis present

## 2021-09-05 DIAGNOSIS — I5082 Biventricular heart failure: Secondary | ICD-10-CM | POA: Diagnosis present

## 2021-09-05 DIAGNOSIS — Z9981 Dependence on supplemental oxygen: Secondary | ICD-10-CM

## 2021-09-05 DIAGNOSIS — Z90710 Acquired absence of both cervix and uterus: Secondary | ICD-10-CM

## 2021-09-05 DIAGNOSIS — J90 Pleural effusion, not elsewhere classified: Secondary | ICD-10-CM | POA: Diagnosis present

## 2021-09-05 DIAGNOSIS — I081 Rheumatic disorders of both mitral and tricuspid valves: Secondary | ICD-10-CM | POA: Diagnosis present

## 2021-09-05 DIAGNOSIS — Z515 Encounter for palliative care: Secondary | ICD-10-CM

## 2021-09-05 DIAGNOSIS — C7800 Secondary malignant neoplasm of unspecified lung: Secondary | ICD-10-CM | POA: Diagnosis present

## 2021-09-05 DIAGNOSIS — Z7189 Other specified counseling: Secondary | ICD-10-CM | POA: Diagnosis not present

## 2021-09-05 DIAGNOSIS — Z6836 Body mass index (BMI) 36.0-36.9, adult: Secondary | ICD-10-CM

## 2021-09-05 DIAGNOSIS — C786 Secondary malignant neoplasm of retroperitoneum and peritoneum: Secondary | ICD-10-CM | POA: Diagnosis present

## 2021-09-05 DIAGNOSIS — Z91013 Allergy to seafood: Secondary | ICD-10-CM

## 2021-09-05 DIAGNOSIS — E871 Hypo-osmolality and hyponatremia: Secondary | ICD-10-CM | POA: Diagnosis present

## 2021-09-05 DIAGNOSIS — I132 Hypertensive heart and chronic kidney disease with heart failure and with stage 5 chronic kidney disease, or end stage renal disease: Secondary | ICD-10-CM | POA: Diagnosis present

## 2021-09-05 DIAGNOSIS — Z853 Personal history of malignant neoplasm of breast: Secondary | ICD-10-CM

## 2021-09-05 DIAGNOSIS — Z923 Personal history of irradiation: Secondary | ICD-10-CM

## 2021-09-05 DIAGNOSIS — Z952 Presence of prosthetic heart valve: Secondary | ICD-10-CM

## 2021-09-05 DIAGNOSIS — Z8249 Family history of ischemic heart disease and other diseases of the circulatory system: Secondary | ICD-10-CM

## 2021-09-05 DIAGNOSIS — Z91018 Allergy to other foods: Secondary | ICD-10-CM

## 2021-09-05 DIAGNOSIS — R600 Localized edema: Secondary | ICD-10-CM

## 2021-09-05 DIAGNOSIS — K219 Gastro-esophageal reflux disease without esophagitis: Secondary | ICD-10-CM | POA: Diagnosis present

## 2021-09-05 DIAGNOSIS — E785 Hyperlipidemia, unspecified: Secondary | ICD-10-CM | POA: Diagnosis present

## 2021-09-05 DIAGNOSIS — Z87891 Personal history of nicotine dependence: Secondary | ICD-10-CM

## 2021-09-05 DIAGNOSIS — I10 Essential (primary) hypertension: Secondary | ICD-10-CM | POA: Diagnosis present

## 2021-09-05 DIAGNOSIS — M898X9 Other specified disorders of bone, unspecified site: Secondary | ICD-10-CM | POA: Diagnosis present

## 2021-09-05 DIAGNOSIS — R079 Chest pain, unspecified: Secondary | ICD-10-CM

## 2021-09-05 LAB — BASIC METABOLIC PANEL
Anion gap: 15 (ref 5–15)
BUN: 39 mg/dL — ABNORMAL HIGH (ref 8–23)
CO2: 25 mmol/L (ref 22–32)
Calcium: 8.8 mg/dL — ABNORMAL LOW (ref 8.9–10.3)
Chloride: 92 mmol/L — ABNORMAL LOW (ref 98–111)
Creatinine, Ser: 5.09 mg/dL — ABNORMAL HIGH (ref 0.44–1.00)
GFR, Estimated: 8 mL/min — ABNORMAL LOW (ref >60)
Glucose, Bld: 84 mg/dL (ref 70–99)
Potassium: 3.6 mmol/L (ref 3.5–5.1)
Sodium: 132 mmol/L — ABNORMAL LOW (ref 135–145)

## 2021-09-05 LAB — CBC
HCT: 32.3 % — ABNORMAL LOW (ref 36.0–46.0)
Hemoglobin: 9.9 g/dL — ABNORMAL LOW (ref 12.0–15.0)
MCH: 31.4 pg (ref 26.0–34.0)
MCHC: 30.7 g/dL (ref 30.0–36.0)
MCV: 102.5 fL — ABNORMAL HIGH (ref 80.0–100.0)
Platelets: 120 10*3/uL — ABNORMAL LOW (ref 150–400)
RBC: 3.15 MIL/uL — ABNORMAL LOW (ref 3.87–5.11)
RDW: 19.5 % — ABNORMAL HIGH (ref 11.5–15.5)
WBC: 6.9 10*3/uL (ref 4.0–10.5)
nRBC: 0 % (ref 0.0–0.2)

## 2021-09-05 LAB — HEPATIC FUNCTION PANEL
ALT: 49 U/L — ABNORMAL HIGH (ref 0–44)
AST: 144 U/L — ABNORMAL HIGH (ref 15–41)
Albumin: 2.9 g/dL — ABNORMAL LOW (ref 3.5–5.0)
Alkaline Phosphatase: 485 U/L — ABNORMAL HIGH (ref 38–126)
Bilirubin, Direct: 1.4 mg/dL — ABNORMAL HIGH (ref 0.0–0.2)
Indirect Bilirubin: 1 mg/dL — ABNORMAL HIGH (ref 0.3–0.9)
Total Bilirubin: 2.4 mg/dL — ABNORMAL HIGH (ref 0.3–1.2)
Total Protein: 6.1 g/dL — ABNORMAL LOW (ref 6.5–8.1)

## 2021-09-05 LAB — RESP PANEL BY RT-PCR (FLU A&B, COVID) ARPGX2
Influenza A by PCR: NEGATIVE
Influenza B by PCR: NEGATIVE
SARS Coronavirus 2 by RT PCR: NEGATIVE

## 2021-09-05 LAB — TROPONIN I (HIGH SENSITIVITY)
Troponin I (High Sensitivity): 85 ng/L — ABNORMAL HIGH (ref <18)
Troponin I (High Sensitivity): 79 ng/L — ABNORMAL HIGH (ref <18)

## 2021-09-05 MED ORDER — DILTIAZEM HCL-DEXTROSE 125-5 MG/125ML-% IV SOLN (PREMIX)
5.0000 mg/h | INTRAVENOUS | Status: DC
  Administered 2021-09-05: 5 mg/h via INTRAVENOUS
  Filled 2021-09-05: qty 125

## 2021-09-05 MED ORDER — ATORVASTATIN CALCIUM 80 MG PO TABS
80.0000 mg | ORAL_TABLET | Freq: Every day | ORAL | Status: DC
  Administered 2021-09-05 – 2021-09-06 (×2): 80 mg via ORAL
  Filled 2021-09-05 (×2): qty 1

## 2021-09-05 MED ORDER — ZOLPIDEM TARTRATE 5 MG PO TABS: 5.0000 mg | ORAL_TABLET | Freq: Every evening | ORAL | Status: DC | PRN

## 2021-09-05 MED ORDER — POLYETHYLENE GLYCOL 3350 17 G PO PACK: 17.0000 g | PACK | Freq: Every day | ORAL | Status: DC | PRN

## 2021-09-05 MED ORDER — SODIUM CHLORIDE 0.9 % IV BOLUS
500.0000 mL | Freq: Once | INTRAVENOUS | Status: AC
  Administered 2021-09-05: 500 mL via INTRAVENOUS

## 2021-09-05 MED ORDER — RENA-VITE PO TABS
1.0000 | ORAL_TABLET | Freq: Every day | ORAL | Status: DC
  Administered 2021-09-05 – 2021-09-06 (×2): 1 via ORAL
  Filled 2021-09-05 (×2): qty 1

## 2021-09-05 MED ORDER — DOCUSATE SODIUM 283 MG RE ENEM
1.0000 | ENEMA | RECTAL | Status: DC | PRN
  Filled 2021-09-05: qty 1

## 2021-09-05 MED ORDER — HYDROXYZINE HCL 25 MG PO TABS: 25.0000 mg | ORAL_TABLET | Freq: Three times a day (TID) | ORAL | Status: DC | PRN

## 2021-09-05 MED ORDER — SODIUM CHLORIDE 0.9% FLUSH
3.0000 mL | Freq: Two times a day (BID) | INTRAVENOUS | Status: DC
  Administered 2021-09-06 – 2021-09-07 (×2): 3 mL via INTRAVENOUS

## 2021-09-05 MED ORDER — SODIUM CHLORIDE 0.9 % IV SOLN: 100.0000 mL | INTRAVENOUS | Status: DC | PRN

## 2021-09-05 MED ORDER — IOHEXOL 350 MG/ML SOLN
80.0000 mL | Freq: Once | INTRAVENOUS | Status: AC | PRN
  Administered 2021-09-05: 80 mL via INTRAVENOUS

## 2021-09-05 MED ORDER — ACETAMINOPHEN 500 MG PO TABS
1000.0000 mg | ORAL_TABLET | Freq: Once | ORAL | Status: AC
  Administered 2021-09-05: 1000 mg via ORAL
  Filled 2021-09-05: qty 2

## 2021-09-05 MED ORDER — ACETAMINOPHEN 325 MG PO TABS
650.0000 mg | ORAL_TABLET | Freq: Four times a day (QID) | ORAL | Status: DC | PRN
  Administered 2021-09-06: 650 mg via ORAL
  Filled 2021-09-05: qty 2

## 2021-09-05 MED ORDER — SORBITOL 70 % SOLN
30.0000 mL | Status: DC | PRN
  Filled 2021-09-05: qty 30

## 2021-09-05 MED ORDER — CINACALCET HCL 30 MG PO TABS
30.0000 mg | ORAL_TABLET | Freq: Every day | ORAL | Status: DC
  Administered 2021-09-06: 30 mg via ORAL
  Filled 2021-09-05: qty 1

## 2021-09-05 MED ORDER — PROSOURCE PLUS PO LIQD
30.0000 mL | Freq: Two times a day (BID) | ORAL | Status: DC
  Administered 2021-09-06 (×2): 30 mL via ORAL
  Filled 2021-09-05: qty 30

## 2021-09-05 MED ORDER — LIDOCAINE-PRILOCAINE 2.5-2.5 % EX CREA
1.0000 | TOPICAL_CREAM | CUTANEOUS | Status: DC | PRN
Start: 2021-09-06 — End: 2021-09-07
  Filled 2021-09-05: qty 5

## 2021-09-05 MED ORDER — ACETAMINOPHEN 650 MG RE SUPP: 650.0000 mg | Freq: Four times a day (QID) | RECTAL | Status: DC | PRN

## 2021-09-05 MED ORDER — DILTIAZEM LOAD VIA INFUSION
20.0000 mg | Freq: Once | INTRAVENOUS | Status: AC
  Administered 2021-09-05: 20 mg via INTRAVENOUS
  Filled 2021-09-05: qty 20

## 2021-09-05 MED ORDER — NEPRO/CARBSTEADY PO LIQD
237.0000 mL | Freq: Three times a day (TID) | ORAL | Status: DC | PRN
  Filled 2021-09-05: qty 237

## 2021-09-05 MED ORDER — ONDANSETRON HCL 4 MG/2ML IJ SOLN: 4.0000 mg | Freq: Four times a day (QID) | INTRAMUSCULAR | Status: DC | PRN

## 2021-09-05 MED ORDER — FERRIC CITRATE 1 GM 210 MG(FE) PO TABS
420.0000 mg | ORAL_TABLET | Freq: Three times a day (TID) | ORAL | Status: DC
  Administered 2021-09-05: 420 mg via ORAL
  Administered 2021-09-06 (×3): 210 mg via ORAL
  Filled 2021-09-05 (×3): qty 2

## 2021-09-05 MED ORDER — APIXABAN 5 MG PO TABS: 5.0000 mg | ORAL_TABLET | Freq: Two times a day (BID) | ORAL | Status: DC

## 2021-09-05 MED ORDER — AMIODARONE HCL IN DEXTROSE 360-4.14 MG/200ML-% IV SOLN
30.0000 mg/h | INTRAVENOUS | Status: DC
  Administered 2021-09-05 – 2021-09-07 (×4): 30 mg/h via INTRAVENOUS
  Filled 2021-09-05 (×3): qty 200

## 2021-09-05 MED ORDER — GABAPENTIN 100 MG PO CAPS
100.0000 mg | ORAL_CAPSULE | Freq: Three times a day (TID) | ORAL | Status: DC
  Administered 2021-09-05 – 2021-09-06 (×5): 100 mg via ORAL
  Filled 2021-09-05 (×5): qty 1

## 2021-09-05 MED ORDER — LIDOCAINE HCL (PF) 1 % IJ SOLN: 5.0000 mL | INTRAMUSCULAR | Status: DC | PRN

## 2021-09-05 MED ORDER — AMIODARONE LOAD VIA INFUSION
150.0000 mg | Freq: Once | INTRAVENOUS | Status: AC
  Administered 2021-09-05: 150 mg via INTRAVENOUS
  Filled 2021-09-05: qty 83.34

## 2021-09-05 MED ORDER — CAMPHOR-MENTHOL 0.5-0.5 % EX LOTN
1.0000 "application " | TOPICAL_LOTION | Freq: Three times a day (TID) | CUTANEOUS | Status: DC | PRN
  Filled 2021-09-05: qty 222

## 2021-09-05 MED ORDER — ALLOPURINOL 300 MG PO TABS
300.0000 mg | ORAL_TABLET | Freq: Every day | ORAL | Status: DC
  Administered 2021-09-05 – 2021-09-06 (×2): 300 mg via ORAL
  Filled 2021-09-05 (×2): qty 1

## 2021-09-05 MED ORDER — PENTAFLUOROPROP-TETRAFLUOROETH EX AERO: 1.0000 "application " | INHALATION_SPRAY | CUTANEOUS | Status: DC | PRN

## 2021-09-05 MED ORDER — FERROUS SULFATE 325 (65 FE) MG PO TABS
324.0000 mg | ORAL_TABLET | Freq: Every day | ORAL | Status: DC
  Administered 2021-09-06: 324 mg via ORAL
  Filled 2021-09-05: qty 1

## 2021-09-05 MED ORDER — ONDANSETRON HCL 4 MG PO TABS: 4.0000 mg | ORAL_TABLET | Freq: Four times a day (QID) | ORAL | Status: DC | PRN

## 2021-09-05 MED ORDER — ALTEPLASE 2 MG IJ SOLR
2.0000 mg | Freq: Once | INTRAMUSCULAR | Status: DC | PRN
Start: 2021-09-06 — End: 2021-09-07
  Filled 2021-09-05: qty 2

## 2021-09-05 MED ORDER — HEPARIN SODIUM (PORCINE) 1000 UNIT/ML DIALYSIS
1000.0000 [IU] | INTRAMUSCULAR | Status: DC | PRN
  Filled 2021-09-05: qty 1

## 2021-09-05 MED ORDER — ALBUTEROL SULFATE (2.5 MG/3ML) 0.083% IN NEBU: 2.5000 mg | INHALATION_SOLUTION | Freq: Four times a day (QID) | RESPIRATORY_TRACT | Status: DC | PRN

## 2021-09-05 MED ORDER — AMIODARONE HCL IN DEXTROSE 360-4.14 MG/200ML-% IV SOLN
60.0000 mg/h | INTRAVENOUS | Status: AC
  Administered 2021-09-05 (×2): 60 mg/h via INTRAVENOUS
  Filled 2021-09-05 (×2): qty 200

## 2021-09-05 MED ORDER — HYDRALAZINE HCL 20 MG/ML IJ SOLN: 5.0000 mg | INTRAMUSCULAR | Status: DC | PRN

## 2021-09-05 MED ORDER — APIXABAN 5 MG PO TABS
5.0000 mg | ORAL_TABLET | Freq: Two times a day (BID) | ORAL | Status: DC
  Administered 2021-09-05 – 2021-09-06 (×3): 5 mg via ORAL
  Filled 2021-09-05 (×3): qty 1

## 2021-09-05 MED ORDER — CALCIUM CARBONATE ANTACID 1250 MG/5ML PO SUSP
500.0000 mg | Freq: Four times a day (QID) | ORAL | Status: DC | PRN
  Filled 2021-09-05 (×2): qty 5

## 2021-09-05 MED ORDER — FENTANYL CITRATE PF 50 MCG/ML IJ SOSY
12.5000 ug | PREFILLED_SYRINGE | INTRAMUSCULAR | Status: DC | PRN
  Administered 2021-09-06: 12.5 ug via INTRAVENOUS
  Filled 2021-09-05: qty 1

## 2021-09-05 NOTE — Assessment & Plan Note (Addendum)
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

## 2021-09-05 NOTE — Consult Note (Signed)
[facility] Renal Consultation Note    Indication for Consultation:  Management of ESRD/hemodialysis; anemia, hypertension/volume and secondary hyperparathyroidism  HPI: Melissa Adams is a 75 y.o. female with ESRD on HD, HTN, metastatic poorly differentiated adenocarcinoma, presumed primary breast cancer, s/p radiation therapy. Presented to the ED from her dialysis center with chest pain. On arrival found to be in AFib with RVR. New right pleural effusion on CXR. CT abdomen showing metastatic liver lesions, ascites, anasarca. Labs Na 132, K 3.6, BUN 39 Cr 5.09, Ca 8.8 Alb 2.9.   Cardizem started in the ED, now on amiodarone gtt d/t low blood pressures. She's very uncomfortable lying on her back and trying to move around to get comfortable. Says she just feels wiped out today. No specific complaints of cp, sob.   Dialysis MWF at [facility]. Dialysis via LUE AVF. Has been tolerating dialysis without much issue.     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   Cardiomyopathy ([place])    CHF (congestive heart failure) ([place])    Chronic kidney disease    Edema    History of hemodialysis    Hyperlipidemia    Hypertension    Hypotension    on midodrine (as of 06/15/21)   Pulmonary hypertension (HCC)    Recurrent right pleural effusion    As of 06/15/21: s/p thoracenteses 05/05/20, 06/11/20, 08/17/20, 11/18/20, 04/06/21   Sleep apnea    Tricuspid regurgitation    Past Surgical History:  Procedure Laterality Date   A/V FISTULAGRAM Left 05/23/2020   Procedure: A/V FISTULAGRAM;  Surgeon: [name], MD;  Location: [place] CV LAB;  Service: Cardiovascular;  Laterality: Left;   A/V FISTULAGRAM N/A 12/21/2020   Procedure: A/V FISTULAGRAM - Left Upper Arm;  Surgeon: [name], MD;  Location: [place] CV LAB;  Service: Cardiovascular;  Laterality: N/A;    ABDOMINAL HYSTERECTOMY     AV FISTULA PLACEMENT Left 11/04/2019   Procedure: Insertion Of Arteriovenous (Av) Gore-Tex Graft Arm;  Surgeon: [name], MD;  Location: [place];  Service: Vascular;  Laterality: Left;   BIOPSY  11/14/2020   Procedure: BIOPSY;  Surgeon: Yetta Flock, MD;  Location: [place];  Service: Gastroenterology;;   BIOPSY  11/16/2020   Procedure: BIOPSY;  Surgeon: Yetta Flock, MD;  Location: [place];  Service: Gastroenterology;;   BIOPSY  02/22/2021   Procedure: BIOPSY;  Surgeon: Irving Copas., MD;  Location: [facility] ENDOSCOPY;  Service: Gastroenterology;;   CARDIAC CATHETERIZATION     CARDIOVERSION N/A 10/26/2019   Procedure: CARDIOVERSION;  Surgeon: [name], MD;  Location: [facility] ENDOSCOPY;  Service: Cardiovascular;  Laterality: N/A;   COLONOSCOPY W/ BIOPSIES AND POLYPECTOMY     COLONOSCOPY WITH PROPOFOL N/A 11/16/2020   Procedure: COLONOSCOPY WITH PROPOFOL;  Surgeon: Yetta Flock, MD;  Location: [place];  Service: Gastroenterology;  Laterality: N/A;   ENDOSCOPIC MUCOSAL RESECTION N/A 02/22/2021   Procedure: ENDOSCOPIC MUCOSAL RESECTION;  Surgeon: Rush Landmark Telford Nab., MD;  Location: [place];  Service: Gastroenterology;  Laterality: N/A;   ESOPHAGOGASTRODUODENOSCOPY (EGD) WITH PROPOFOL Left 11/14/2020   Procedure: ESOPHAGOGASTRODUODENOSCOPY (EGD) WITH PROPOFOL;  Surgeon: Yetta Flock, MD;  Location: [place];  Service: Gastroenterology;  Laterality: Left;   ESOPHAGOGASTRODUODENOSCOPY (EGD) WITH PROPOFOL N/A 02/22/2021   Procedure: ESOPHAGOGASTRODUODENOSCOPY (EGD) WITH PROPOFOL;  Surgeon: Irving Copas.,  MD;  Location: [place];  Service: Gastroenterology;  Laterality: N/A;   FISTULA SUPERFICIALIZATION Left 01/21/2020   Procedure: FISTULA SUPERFICIALIZATION OF FISTULA;  Surgeon: [name], MD;  Location: [place];  Service: Vascular;  Laterality: Left;   FISTULA SUPERFICIALIZATION  Left 4/0/1027   Procedure: PLICATION OF LEFT UPPER EXTREMITY ARTERIOVENOUS FISTULA;  Surgeon: Waynetta [name], MD;  Location: [place];  Service: Vascular;  Laterality: Left;   GIVENS CAPSULE STUDY N/A 11/16/2020   Procedure: GIVENS CAPSULE STUDY;  Surgeon: Yetta Flock, MD;  Location: [place];  Service: Gastroenterology;  Laterality: N/A;   HEMOSTASIS CLIP PLACEMENT  02/22/2021   Procedure: HEMOSTASIS CLIP PLACEMENT;  Surgeon: Rush Landmark Telford Nab., MD;  Location: [place];  Service: Gastroenterology;;   INSERTION OF DIALYSIS CATHETER Right 11/04/2019   Procedure: CONVERT TEMPORARY DIALYSIS CATHETER TO TUNNELED DIALYSIS CATHETER Right Internal Jugular.;  Surgeon: [name], MD;  Location: [place];  Service: Vascular;  Laterality: Right;   IR BONE TUMOR(S)RF ABLATION  07/23/2021   IR KYPHO LUMBAR INC FX REDUCE BONE BX UNI/BIL CANNULATION INC/IMAGING  07/23/2021   IR THORACENTESIS ASP PLEURAL SPACE W/IMG GUIDE  08/17/2020   IR THORACENTESIS ASP PLEURAL SPACE W/IMG GUIDE  07/20/2021   MULTIPLE TOOTH EXTRACTIONS     POLYPECTOMY  11/16/2020   Procedure: POLYPECTOMY;  Surgeon: Yetta Flock, MD;  Location: [place];  Service: Gastroenterology;;   RADIOLOGY WITH ANESTHESIA N/A 06/19/2021   Procedure: MRI WITH ANESTHESIA  ,LIVER WITH AND WITHOUT CONTRAST;  Surgeon: Radiologist, Medication, MD;  Location: [place];  Service: Radiology;  Laterality: N/A;   RADIOLOGY WITH ANESTHESIA N/A 07/20/2021   Procedure: MRI WITH ANESTHESIA LOWER SPINE WITHOUT CONTRAST;  Surgeon: Radiologist, Medication, MD;  Location: [place];  Service: Radiology;  Laterality: N/A;   REVISION OF ARTERIOVENOUS GORETEX GRAFT Left 01/21/2020   Procedure: REVISION OF ARTERIOVENOUS FISTULA WITH SIDE BRANCH LIGATION;  Surgeon: [name], MD;  Location: [place];  Service: Vascular;  Laterality: Left;   SUBMUCOSAL LIFTING INJECTION  02/22/2021   Procedure: SUBMUCOSAL LIFTING INJECTION;  Surgeon:  Irving Copas., MD;  Location: [place];  Service: Gastroenterology;;   SUBMUCOSAL TATTOO INJECTION  02/22/2021   Procedure: SUBMUCOSAL TATTOO INJECTION;  Surgeon: Irving Copas., MD;  Location: [place];  Service: Gastroenterology;;   TEE WITHOUT CARDIOVERSION N/A 10/26/2019   Procedure: TRANSESOPHAGEAL ECHOCARDIOGRAM (TEE);  Surgeon: [name], MD;  Location: [facility] ENDOSCOPY;  Service: Cardiovascular;  Laterality: N/A;   THORACENTESIS     x 2   THORACENTESIS N/A 11/07/2020   Procedure: Mathews Robinsons;  Surgeon: [name], MD;  Location: [facility] ENDOSCOPY;  Service: Pulmonary;  Laterality: N/A;   THORACENTESIS N/A 04/06/2021   Procedure: THORACENTESIS;  Surgeon: Freddi Starr, MD;  Location: [facility] ENDOSCOPY;  Service: Pulmonary;  Laterality: N/A;   UPPER ESOPHAGEAL ENDOSCOPIC ULTRASOUND (EUS) N/A 02/22/2021   Procedure: UPPER ESOPHAGEAL ENDOSCOPIC ULTRASOUND (EUS);  Surgeon: Irving Copas., MD;  Location: [place];  Service: Gastroenterology;  Laterality: N/A;   Family History  Problem Relation Age of Onset   Seizures Father    CAD Father    Diabetes Sister    Lupus Sister    Hypertension Sister    Colon cancer Neg Hx    Esophageal cancer Neg Hx    Pancreatic cancer Neg Hx    Stomach cancer Neg Hx    Inflammatory bowel disease Neg Hx    Liver disease Neg Hx    Rectal cancer Neg Hx  Social History:  reports that she has quit smoking. Her smoking use included cigarettes. She has never used smokeless tobacco. She reports that she does not currently use alcohol. She reports that she does not currently use drugs. Allergies  Allergen Reactions   Black Walnut Flavor Hives   Shellfish Allergy Itching    Makes throat itch    Prior to Admission medications   Medication Sig Start Date End Date Taking? Authorizing Provider  acetaminophen (TYLENOL) 325 MG tablet Take 2 tablets (650 mg total) by mouth every 4 (four) hours as needed for  headache or mild pain. 11/05/19   Kinnie Feil, MD  albuterol (VENTOLIN HFA) 108 (90 Base) MCG/ACT inhaler Inhale 2 puffs into the lungs every 6 (six) hours as needed for wheezing or shortness of breath. 11/30/19   [name], NP  allopurinol (ZYLOPRIM) 300 MG tablet Take 1 tablet (300 mg total) by mouth at bedtime. 07/20/21   [name], MD  apixaban (ELIQUIS) 5 MG TABS tablet Take 1 tablet (5 mg total) by mouth 2 (two) times daily. 07/20/21   [name], MD  atorvastatin (LIPITOR) 80 MG tablet Take 1 tablet (80 mg total) by mouth at bedtime. 07/20/21   [name], MD  cetirizine (ZYRTEC) 10 MG tablet Take 10 mg by mouth daily as needed for allergies.    [provider]  cinacalcet (SENSIPAR) 30 MG tablet Take 30 mg by mouth daily with supper.  04/18/20   [provider]  ferric citrate (AURYXIA) 1 GM 210 MG(Fe) tablet Take 420 mg by mouth 3 (three) times daily with meals.    [provider]  ferrous sulfate 324 MG TBEC Take 324 mg by mouth daily.    [provider]  furosemide (LASIX) 40 MG tablet Take 80mg  twice daily on non dialysis days. 08/20/21   [name], MD  gabapentin (NEURONTIN) 100 MG capsule TAKE 1 CAPSULE BY MOUTH THREE TIMES DAILY 08/29/21   Gildardo Pounds, NP  lidocaine-prilocaine (EMLA) cream Apply 1 application topically See admin instructions. Apply topically to port access one hour prior to dialysis - Monday, Wednesday, Friday, Saturday 04/07/20   [provider]  metoprolol tartrate (LOPRESSOR) 25 MG tablet Take 0.5 tablets (12.5 mg total) by mouth 2 (two) times daily. 08/21/21   [name], MD  multivitamin (RENA-VIT) TABS tablet Take 1 tablet by mouth at bedtime. 07/31/21   Cristal Deer, MD  Nutritional Supplements (,FEEDING SUPPLEMENT, PROSOURCE PLUS) liquid Take 30 mLs by mouth 2 (two) times daily between meals. 11/19/20   Pahwani, Michell Heinrich, MD  polyethylene glycol (MIRALAX / GLYCOLAX) 17 g  packet Take 17 g by mouth daily as needed for moderate constipation. 07/31/21   Cristal Deer, MD  triamcinolone 0.1% oint-Eucerin equivalent cream 1:1 mixture Apply topically 3 (three) times daily as needed. APPLY TO DRY AFFECTED AREAS AS NEEDED 3 TIMES ADAY Patient taking differently: Apply 1 application topically 3 (three) times daily as needed for irritation, itching or rash. 04/24/21   [name], NP  vitamin B-12 (CYANOCOBALAMIN) 1000 MCG tablet Take 1,000 mcg by mouth daily.    [provider]   Current Facility-Administered Medications  Medication Dose Route Frequency Provider Last Rate Last Admin   (feeding supplement) PROSource Plus liquid 30 mL  30 mL Oral BID BM Karmen Bongo, MD       acetaminophen (TYLENOL) tablet 650 mg  650 mg Oral Q6H PRN Karmen Bongo, MD  Or   acetaminophen (TYLENOL) suppository 650 mg  650 mg Rectal Q6H PRN Karmen Bongo, MD       albuterol (VENTOLIN HFA) 108 (90 Base) MCG/ACT inhaler 2 puff  2 puff Inhalation Q6H PRN Karmen Bongo, MD       allopurinol (ZYLOPRIM) tablet 300 mg  300 mg Oral QHS Karmen Bongo, MD       amiodarone (NEXTERONE PREMIX) 360-4.14 MG/200ML-% (1.8 mg/mL) IV infusion  60 mg/hr Intravenous Continuous Karmen Bongo, MD 33.3 mL/hr at 09/05/21 1206 60 mg/hr at 09/05/21 1206   Followed by   amiodarone (NEXTERONE PREMIX) 360-4.14 MG/200ML-% (1.8 mg/mL) IV infusion  30 mg/hr Intravenous Continuous Karmen Bongo, MD       apixaban Arne Cleveland) tablet 5 mg  5 mg Oral BID Karmen Bongo, MD       atorvastatin (LIPITOR) tablet 80 mg  80 mg Oral Ivery Quale, MD       calcium carbonate (dosed in mg elemental calcium) suspension 500 mg of elemental calcium  500 mg of elemental calcium Oral Q6H PRN Karmen Bongo, MD       camphor-menthol [facility]) lotion 1 application  1 application Topical H0Q PRN Karmen Bongo, MD       And   hydrOXYzine (ATARAX/VISTARIL) tablet 25 mg  25 mg Oral Q8H PRN Karmen Bongo,  MD       cinacalcet [facility]) tablet 30 mg  30 mg Oral Q supper Karmen Bongo, MD       docusate sodium (ENEMEEZ) enema 283 mg  1 enema Rectal PRN Karmen Bongo, MD       feeding supplement (NEPRO CARB STEADY) liquid 237 mL  237 mL Oral TID PRN Karmen Bongo, MD       fentaNYL (SUBLIMAZE) injection 12.5-50 mcg  12.5-50 mcg Intravenous Q2H PRN Karmen Bongo, MD       ferric citrate (AURYXIA) tablet 420 mg  420 mg Oral TID WC Karmen Bongo, MD       ferrous sulfate EC tablet 324 mg  324 mg Oral Daily Karmen Bongo, MD       gabapentin (NEURONTIN) capsule 100 mg  100 mg Oral TID Karmen Bongo, MD       hydrALAZINE (APRESOLINE) injection 5 mg  5 mg Intravenous Q4H PRN Karmen Bongo, MD       multivitamin (RENA-VIT) tablet 1 tablet  1 tablet Oral QHS Karmen Bongo, MD       ondansetron [facility]) tablet 4 mg  4 mg Oral Q6H PRN Karmen Bongo, MD       Or   ondansetron [facility]) injection 4 mg  4 mg Intravenous Q6H PRN Karmen Bongo, MD       polyethylene glycol (MIRALAX / GLYCOLAX) packet 17 g  17 g Oral Daily PRN Karmen Bongo, MD       sodium chloride flush (NS) 0.9 % injection 3 mL  3 mL Intravenous Q12H Karmen Bongo, MD       sorbitol 70 % solution 30 mL  30 mL Oral PRN Karmen Bongo, MD       zolpidem Lorrin Mais) tablet 5 mg  5 mg Oral QHS PRN Karmen Bongo, MD       Current Outpatient Medications  Medication Sig Dispense Refill   acetaminophen (TYLENOL) 325 MG tablet Take 2 tablets (650 mg total) by mouth every 4 (four) hours as needed for headache or mild pain.     albuterol (VENTOLIN HFA) 108 (90 Base) MCG/ACT inhaler Inhale 2 puffs into the lungs every  6 (six) hours as needed for wheezing or shortness of breath. 6.7 g 1   allopurinol (ZYLOPRIM) 300 MG tablet Take 1 tablet (300 mg total) by mouth at bedtime. 30 tablet 3   apixaban (ELIQUIS) 5 MG TABS tablet Take 1 tablet (5 mg total) by mouth 2 (two) times daily. 60 tablet 3   atorvastatin (LIPITOR) 80 MG tablet  Take 1 tablet (80 mg total) by mouth at bedtime. 30 tablet 5   cetirizine (ZYRTEC) 10 MG tablet Take 10 mg by mouth daily as needed for allergies.     cinacalcet (SENSIPAR) 30 MG tablet Take 30 mg by mouth daily with supper.      ferric citrate (AURYXIA) 1 GM 210 MG(Fe) tablet Take 420 mg by mouth 3 (three) times daily with meals.     ferrous sulfate 324 MG TBEC Take 324 mg by mouth daily.     furosemide (LASIX) 40 MG tablet Take 80mg  twice daily on non dialysis days. 30 tablet 11   gabapentin (NEURONTIN) 100 MG capsule TAKE 1 CAPSULE BY MOUTH THREE TIMES DAILY 90 capsule 0   lidocaine-prilocaine (EMLA) cream Apply 1 application topically See admin instructions. Apply topically to port access one hour prior to dialysis - Monday, Wednesday, Friday, Saturday     metoprolol tartrate (LOPRESSOR) 25 MG tablet Take 0.5 tablets (12.5 mg total) by mouth 2 (two) times daily. 60 tablet 5   multivitamin (RENA-VIT) TABS tablet Take 1 tablet by mouth at bedtime. 30 tablet 0   Nutritional Supplements (,FEEDING SUPPLEMENT, PROSOURCE PLUS) liquid Take 30 mLs by mouth 2 (two) times daily between meals.     polyethylene glycol (MIRALAX / GLYCOLAX) 17 g packet Take 17 g by mouth daily as needed for moderate constipation. 14 each 0   triamcinolone 0.1% oint-Eucerin equivalent cream 1:1 mixture Apply topically 3 (three) times daily as needed. APPLY TO DRY AFFECTED AREAS AS NEEDED 3 TIMES ADAY (Patient taking differently: Apply 1 application topically 3 (three) times daily as needed for irritation, itching or rash.) 360 g 1   vitamin B-12 (CYANOCOBALAMIN) 1000 MCG tablet Take 1,000 mcg by mouth daily.       ROS: As per HPI otherwise negative.  Physical Exam: Vitals:   09/05/21 1230 09/05/21 1245 09/05/21 1300 09/05/21 1330  BP: 102/61 102/60 95/73 (!) 92/57  Pulse: (!) 107 (!) 105 90 94  Resp: 15 13 11 15   Temp:      TempSrc:      SpO2: 98% 98% 100% 98%  Height:         General: Chronically ill appearing,  nad, on nasal oxygen Head: NCAT sclera not icteric MMM Neck: Supple. No JVD No masses Lungs: Diminished lung sounds bilaterally. Normal WOB Heart: RRR with S1 S2 Abdomen: soft non-tender  Lower extremities: trace LE edema, no open wounds  Neuro: A & O  X 3  Moves all extremities spontaneously. Psych:  Responds to questions appropriately with a normal affect. Dialysis Access: LUE AVF +bruit   Labs: Basic Metabolic Panel: Recent Labs  Lab 09/05/21 0721  NA 132*  K 3.6  CL 92*  CO2 25  GLUCOSE 84  BUN 39*  CREATININE 5.09*  CALCIUM 8.8*   Liver Function Tests: Recent Labs  Lab 09/05/21 0721  AST 144*  ALT 49*  ALKPHOS 485*  BILITOT 2.4*  PROT 6.1*  ALBUMIN 2.9*   No results for input(s): LIPASE, AMYLASE in the last 168 hours. No results for input(s): AMMONIA in the last 168  hours. CBC: Recent Labs  Lab 09/05/21 0721  WBC 6.9  HGB 9.9*  HCT 32.3*  MCV 102.5*  PLT 120*   Cardiac Enzymes: No results for input(s): CKTOTAL, CKMB, CKMBINDEX, TROPONINI in the last 168 hours. CBG: No results for input(s): GLUCAP in the last 168 hours. Iron Studies: No results for input(s): IRON, TIBC, TRANSFERRIN, FERRITIN in the last 72 hours. Studies/Results: DG Chest 2 View  Result Date: 09/05/2021 CLINICAL DATA:  Chest and back pain with shortness of breath. EXAM: CHEST - 2 VIEW COMPARISON:  Chest x-ray 07/20/2021 FINDINGS: The heart is enlarged but appears stable. Stable tortuosity and calcification of the thoracic aorta. New moderate-sized right-sided pleural effusion with overlying atelectasis. The left lung remains relatively clear. IMPRESSION: New moderate-sized right pleural effusion with overlying atelectasis. Stable cardiac enlargement. Electronically Signed   By: Marijo Sanes M.D.   On: 09/05/2021 07:51   CT Angio Chest PE W and/or Wo Contrast  Result Date: 09/05/2021 CLINICAL DATA:  PE suspected. High probability. History of carcinoid tumor. EXAM: CT ANGIOGRAPHY CHEST  WITH CONTRAST TECHNIQUE: Multidetector CT imaging of the chest was performed using the standard protocol during bolus administration of intravenous contrast. Multiplanar CT image reconstructions and MIPs were obtained to evaluate the vascular anatomy. CONTRAST:  29mL OMNIPAQUE IOHEXOL 350 MG/ML SOLN COMPARISON:  Chest radiograph concurrent.  CT chest, 07/06/2021. FINDINGS: Cardiovascular: Satisfactory opacification of the pulmonary arteries to the segmental level. No evidence of pulmonary embolism. Cardiomegaly, with RIGHT heart enlargement. No pericardial effusion. Mediastinum/Nodes: No enlarged mediastinal, hilar, or axillary lymph nodes. Thyroid gland, trachea, and esophagus demonstrate no significant findings. Lungs/Pleura: Increased number and size of multiple pulmonary nodules, since recent 08/22 comparison. Persistent small-to-moderate volume of RIGHT pleural effusion, with adjacent and dependent atelectasis. No pneumothorax. Upper Abdomen: No acute abnormality within the imaged portion. Musculoskeletal: Coarse RIGHT breast calcification is unchanged. The RIGHT axilla adenopathy, with biopsy clip at the dominant node. Lytic osseous lesion at the RIGHT lateral fourth rib, with nondisplaced pathologic fracture. Additional chronic bilateral rib deformities. Degenerative changes of the imaged thoracic spine, including multilevel anterior osteophytes. Review of the MIP images confirms the above findings. IMPRESSION: 1. No segmental or larger pulmonary embolus. 2. Persistent moderate volume RIGHT pleural effusion with atelectasis. 3. Increased number and size of multifocal pulmonary nodules since August 22, with findings favored metastatic. 4. RIGHT lateral fourth rib lytic lesion with nondisplaced pathologic fracture. 5. Cardiomegaly with RIGHT heart enlargement. Additional chronic and senescent changes, as above. Electronically Signed   By: Michaelle Birks M.D.   On: 09/05/2021 10:16   CT ABDOMEN PELVIS W  CONTRAST  Result Date: 09/05/2021 CLINICAL DATA:  Abdominal pain, metastatic adenocarcinoma to liver and bones EXAM: CT ABDOMEN AND PELVIS WITH CONTRAST TECHNIQUE: Multidetector CT imaging of the abdomen and pelvis was performed using the standard protocol following bolus administration of intravenous contrast. CONTRAST:  10mL OMNIPAQUE IOHEXOL 350 MG/ML SOLN COMPARISON:  07/06/2021 FINDINGS: Lower chest: Moderate to large right pleural effusion associated atelectasis or consolidation. Cardiomegaly. Hepatobiliary: Numerous liver lesions of varying sizes, increased in size and number compared to prior examination, largest almost entirely replacing the left lobe measuring 8.4 x 6.5 cm, previously 3.5 x 3.5 cm when measured similarly (series 6, image 20). Additional index lesion of the posterior right lobe of the liver measures 2.9 x 2.7 cm, previously 1.7 x 1.7 cm (series 6, image 26). Tiny gallstones and or sludge in the dependent gallbladder. No gallbladder wall thickening. No biliary dilatation. Pancreas: Unremarkable. No pancreatic  ductal dilatation or surrounding inflammatory changes. Spleen: Normal in size without significant abnormality. Adrenals/Urinary Tract: Benign, fatty attenuation left adrenal nodule (series 5, image 7). Kidneys are normal, without renal calculi, solid lesion, or hydronephrosis. Bladder is unremarkable. Stomach/Bowel: Stomach is within normal limits. Appendix appears normal. No evidence of bowel wall thickening, distention, or inflammatory changes. Vascular/Lymphatic: Aortic atherosclerosis. No enlarged abdominal or pelvic lymph nodes. Reproductive: Status post hysterectomy. Other: Anasarca. Small volume ascites throughout the abdomen and pelvis, increased compared to prior examination. Some evidence of peritoneal thickening in the pelvis (series 6, image 71). Musculoskeletal: Diffusely lytic osseous metastatic disease throughout, status post vertebral cement augmentation of L4.  IMPRESSION: 1. Numerous liver lesions of varying sizes, increased in size and number compared to prior examination, consistent with worsened hepatic metastatic disease. 2. Small volume ascites throughout the abdomen and pelvis, increased compared to prior examination. Some evidence of peritoneal thickening in the pelvis. 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 3. Diffusely lytic osseous metastatic disease throughout, status post vertebral cement augmentation of L4. 4. Moderate to large right pleural effusion associated atelectasis or consolidation, incompletely imaged. 5. Cardiomegaly. Aortic Atherosclerosis (ICD10-I70.0). Electronically Signed   By: Delanna Ahmadi M.D.   On: 09/05/2021 10:37   VAS [place] LOWER EXTREMITY VENOUS (DVT) (7a-7p)  Result Date: 09/05/2021  Lower Venous DVT Study Patient Name:  SHAMARIAH SHEWMAKE [facility]  Date of Exam:   09/05/2021 Medical Rec #: [id number]          Accession #:    [id number] Date of Birth: 1946/03/23         Patient Gender: F Patient Age:   53 years Exam Location:  [facility] Procedure:      VAS [place] LOWER EXTREMITY VENOUS (DVT) Referring Phys: JULIE HAVILAND --------------------------------------------------------------------------------  Indications: Edema.  Comparison Study: 10-13-2019 Prior bilateral lower extremity venous was negative                   for DVT. Performing Technologist: Darlin Coco RDMS, RVT  Examination Guidelines: A complete evaluation includes B-mode imaging, spectral Doppler, color Doppler, and power Doppler as needed of all accessible portions of each vessel. Bilateral testing is considered an integral part of a complete examination. Limited examinations for reoccurring indications may be performed as noted. The reflux portion of the exam is performed with the patient in reverse Trendelenburg.  +-----+---------------+---------+-----------+----------+-----------------------+  RIGHTCompressibilityPhasicitySpontaneityPropertiesThrombus Aging          +-----+---------------+---------+-----------+----------+-----------------------+ CFV                                               Unable to access due to                                                   patient positioning     +-----+---------------+---------+-----------+----------+-----------------------+   +---------+---------------+---------+-----------+----------+--------------+ LEFT     CompressibilityPhasicitySpontaneityPropertiesThrombus Aging +---------+---------------+---------+-----------+----------+--------------+ CFV      Full           Yes      Yes                                 +---------+---------------+---------+-----------+----------+--------------+  SFJ      Full                                                        +---------+---------------+---------+-----------+----------+--------------+ FV Prox  Full                                                        +---------+---------------+---------+-----------+----------+--------------+ FV Mid   Full                                                        +---------+---------------+---------+-----------+----------+--------------+ FV DistalFull                                                        +---------+---------------+---------+-----------+----------+--------------+ PFV      Full                                                        +---------+---------------+---------+-----------+----------+--------------+ POP      Full           Yes      Yes                                 +---------+---------------+---------+-----------+----------+--------------+ PTV      Full                                                        +---------+---------------+---------+-----------+----------+--------------+ PERO     Full                                                         +---------+---------------+---------+-----------+----------+--------------+    Summary: LEFT: - There is no evidence of deep vein thrombosis in the lower extremity.  - No cystic structure found in the popliteal fossa.  *See table(s) above for measurements and observations.    Preliminary     Dialysis Orders:  AF MWF 4h 450/500 EDW 88.5kg 2K/3Ca UFP2 AVF Venofer 50 q wk    Assessment/Plan: AFib with RVR -- Hypotensive on diltiazem. On amiodarone/Eliquis  -per primary Recurrent right pleural effusion - likely thoracentesis -per primary  ESRD -  HD MWF. No urgent dialysis indications today. HD off schedule tomorrow.  Hypertension/volume  - Low blood pressures on admission. UF as able.  Anemia  - Hgb 9.9. No ESA d/t malignancy  Metabolic bone disease -  Continue home binders. Follow trends here  Metastatic breast cancer - Poor prognosis. Palliative care consulted.   Lynnda Child PA-C [facility] 09/05/2021, 2:44 PM

## 2021-09-05 NOTE — Assessment & Plan Note (Addendum)
-  Patient on chronic MWF HD, not dialyzed today due to symptoms -Nephrology prn order set utilized -She does not appear to be volume overloaded or otherwise in need of acute HD -Nephrology is aware that patient will need HD -May transition to comfort measures and if so would likely stop HD at that time -Continue Sensipar, Renavite, Auryxia, FeSO4

## 2021-09-05 NOTE — Progress Notes (Signed)
Lower extremity venous LT study completed.  Preliminary results relayed to Gilford Raid, MD.  See CV Proc for preliminary results report.   Darlin Coco, RDMS, RVT

## 2021-09-05 NOTE — Plan of Care (Signed)

## 2021-09-05 NOTE — Assessment & Plan Note (Signed)
-  Patient with known afib, presenting with chest pain/SOB, found to be in RVR.  -She has recurrent pleural effusion as well as widely metastatic breast cancer, either of which could be driving the RVR. -She was given Diltiazem and developed hypotension and so was changed to Amiodarone infusion. -Will admit to progressive care. -HS troponin mildly elevated with negative delta; unlikely ACS. -Will consult cardiology  -Continue home Eliquis.

## 2021-09-05 NOTE — Assessment & Plan Note (Signed)
-  Hold home Lopressor while on drip - and given hypotension when Dilt was started -Will cover with prn IV hydralazine

## 2021-09-05 NOTE — ED Triage Notes (Signed)
Pt arrives via EMS from HD with reports of substernal CP since this last night. 324 ASA given by EMS. Chest pain has been intermittent and none at this moment. Due for dialysis today but they did not treat. Afib RVR for EMS.

## 2021-09-05 NOTE — ED Notes (Signed)
Patient transported to CT 

## 2021-09-05 NOTE — H&P (Signed)
History and Physical    Melissa Adams KJI:[id number] DOB: 09-08-1946 DOA: 09/05/2021  PCP: [name], NP Consultants:  Palliative care; Lisbeth Renshaw - rad onc; Lorenso Courier - oncology; Dewald - pulmonology; Bryan Lemma - GI; Aundra Dubin - cardiology Patient coming from:  Home - lives with daughter, daughter-in-law, ?others; NOK: Daughter, Delmer Islam, [phone]   Chief Complaint: Chest pain  HPI: Melissa Adams is a 75 y.o. 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  She was sent from HD today.  She is somnolent and not well able to describe her reasons for being at the hospital.  I spoke with her daughter - she has been doing pretty well, finished radiation therapy on her back yesterday.  PT/OT have been working with her twice a week.  She seemed a bit tired yesterday from the radiation.  Mammogram did show breast cancer, has appt tomorrow with a doctor at the breast center.  Also needs an appointment with her oncologist.  She was shocked this AM about being called by HD that her mother had SOB and CP.      ED Course: Metastatic breast cancer, palliative rads to her back, not helping.  Sent from HD due to CP.  Given Cardizem, dropped BP to 70s, now better.  Still in afib with RVR, now on Amiodarone.  Also with reaccumulation of R pleural effusion.  Prn home O2, currently on 3L.  Review of Systems: Unable to effectively perform.   Ambulatory Status:  Ambulates short distances with a walker, usually uses a wheelchair  COVID Vaccine Status:  Complete plus booster  Past Medical History:  Diagnosis Date   A-fib ([place])    Allergies    Arthritis    Asthma    Carcinoid tumor of duodenum 11/14/2020   Cardiomyopathy ([place])    CHF (congestive heart failure) ([place])     Chronic kidney disease    Edema    History of hemodialysis    Hyperlipidemia    Hypertension    Hypotension    on midodrine (as of 06/15/21)   Pulmonary hypertension (HCC)    Recurrent right pleural effusion    As of 06/15/21: s/p thoracenteses 05/05/20, 06/11/20, 08/17/20, 11/18/20, 04/06/21   Sleep apnea    Tricuspid regurgitation     Past Surgical History:  Procedure Laterality Date   A/V FISTULAGRAM Left 05/23/2020   Procedure: A/V FISTULAGRAM;  Surgeon: [name], MD;  Location: [place] [facility];  Service: Cardiovascular;  Laterality: Left;   A/V FISTULAGRAM N/A 12/21/2020   Procedure: A/V FISTULAGRAM - Left Upper Arm;  Surgeon: [name], MD;  Location: [place] [facility];  Service: Cardiovascular;  Laterality: N/A;   ABDOMINAL HYSTERECTOMY     AV FISTULA PLACEMENT Left 11/04/2019   Procedure: Insertion Of Arteriovenous (Av) Gore-Tex Graft Arm;  Surgeon: [name], MD;  Location: [place];  Service: Vascular;  Laterality: Left;   BIOPSY  11/14/2020   Procedure: BIOPSY;  Surgeon: Yetta Flock, MD;  Location: [place];  Service: Gastroenterology;;   BIOPSY  11/16/2020   Procedure: BIOPSY;  Surgeon: Yetta Flock, MD;  Location: [place];  Service: Gastroenterology;;   BIOPSY  02/22/2021   Procedure: BIOPSY;  Surgeon: Irving Copas., MD;  Location: [place];  Service: Gastroenterology;;   CARDIAC CATHETERIZATION  CARDIOVERSION N/A 10/26/2019   Procedure: CARDIOVERSION;  Surgeon: Laurey Morale, MD;  Location: [facility] ENDOSCOPY;  Service: Cardiovascular;  Laterality: N/A;   COLONOSCOPY W/ BIOPSIES AND POLYPECTOMY     COLONOSCOPY WITH PROPOFOL N/A 11/16/2020   Procedure: COLONOSCOPY WITH PROPOFOL;  Surgeon: Benancio Deeds, MD;  Location: [facility] ENDOSCOPY;  Service: Gastroenterology;  Laterality: N/A;   ENDOSCOPIC MUCOSAL RESECTION N/A 02/22/2021   Procedure: ENDOSCOPIC MUCOSAL RESECTION;  Surgeon: Meridee Score Netty Starring., MD;   Location: [facility] ENDOSCOPY;  Service: Gastroenterology;  Laterality: N/A;   ESOPHAGOGASTRODUODENOSCOPY (EGD) WITH PROPOFOL Left 11/14/2020   Procedure: ESOPHAGOGASTRODUODENOSCOPY (EGD) WITH PROPOFOL;  Surgeon: Benancio Deeds, MD;  Location: [facility] ENDOSCOPY;  Service: Gastroenterology;  Laterality: Left;   ESOPHAGOGASTRODUODENOSCOPY (EGD) WITH PROPOFOL N/A 02/22/2021   Procedure: ESOPHAGOGASTRODUODENOSCOPY (EGD) WITH PROPOFOL;  Surgeon: Meridee Score Netty Starring., MD;  Location: [facility] ENDOSCOPY;  Service: Gastroenterology;  Laterality: N/A;   FISTULA SUPERFICIALIZATION Left 01/21/2020   Procedure: FISTULA SUPERFICIALIZATION OF FISTULA;  Surgeon: Cephus Shelling, MD;  Location: [facility] OR;  Service: Vascular;  Laterality: Left;   FISTULA SUPERFICIALIZATION Left 03/01/2021   Procedure: PLICATION OF LEFT UPPER EXTREMITY ARTERIOVENOUS FISTULA;  Surgeon: Maeola Harman, MD;  Location: [facility] OR;  Service: Vascular;  Laterality: Left;   GIVENS CAPSULE STUDY N/A 11/16/2020   Procedure: GIVENS CAPSULE STUDY;  Surgeon: Benancio Deeds, MD;  Location: [facility] ENDOSCOPY;  Service: Gastroenterology;  Laterality: N/A;   HEMOSTASIS CLIP PLACEMENT  02/22/2021   Procedure: HEMOSTASIS CLIP PLACEMENT;  Surgeon: Meridee Score Netty Starring., MD;  Location: [facility] ENDOSCOPY;  Service: Gastroenterology;;   INSERTION OF DIALYSIS CATHETER Right 11/04/2019   Procedure: CONVERT TEMPORARY DIALYSIS CATHETER TO TUNNELED DIALYSIS CATHETER Right Internal Jugular.;  Surgeon: Cephus Shelling, MD;  Location: [facility] OR;  Service: Vascular;  Laterality: Right;   IR BONE TUMOR(S)RF ABLATION  07/23/2021   IR KYPHO LUMBAR INC FX REDUCE BONE BX UNI/BIL CANNULATION INC/IMAGING  07/23/2021   IR THORACENTESIS ASP PLEURAL SPACE W/IMG GUIDE  08/17/2020   IR THORACENTESIS ASP PLEURAL SPACE W/IMG GUIDE  07/20/2021   MULTIPLE TOOTH EXTRACTIONS     POLYPECTOMY  11/16/2020   Procedure: POLYPECTOMY;  Surgeon: Benancio Deeds, MD;  Location: [facility] ENDOSCOPY;   Service: Gastroenterology;;   RADIOLOGY WITH ANESTHESIA N/A 06/19/2021   Procedure: MRI WITH ANESTHESIA  ,LIVER WITH AND WITHOUT CONTRAST;  Surgeon: Radiologist, Medication, MD;  Location: [facility] OR;  Service: Radiology;  Laterality: N/A;   RADIOLOGY WITH ANESTHESIA N/A 07/20/2021   Procedure: MRI WITH ANESTHESIA LOWER SPINE WITHOUT CONTRAST;  Surgeon: Radiologist, Medication, MD;  Location: [facility] OR;  Service: Radiology;  Laterality: N/A;   REVISION OF ARTERIOVENOUS GORETEX GRAFT Left 01/21/2020   Procedure: REVISION OF ARTERIOVENOUS FISTULA WITH SIDE BRANCH LIGATION;  Surgeon: Cephus Shelling, MD;  Location: [facility] OR;  Service: Vascular;  Laterality: Left;   SUBMUCOSAL LIFTING INJECTION  02/22/2021   Procedure: SUBMUCOSAL LIFTING INJECTION;  Surgeon: Lemar Lofty., MD;  Location: [facility] ENDOSCOPY;  Service: Gastroenterology;;   SUBMUCOSAL TATTOO INJECTION  02/22/2021   Procedure: SUBMUCOSAL TATTOO INJECTION;  Surgeon: Lemar Lofty., MD;  Location: [facility] ENDOSCOPY;  Service: Gastroenterology;;   TEE WITHOUT CARDIOVERSION N/A 10/26/2019   Procedure: TRANSESOPHAGEAL ECHOCARDIOGRAM (TEE);  Surgeon: Laurey Morale, MD;  Location: [facility] END[facility]COPY;  Service: Cardiovascular;  Laterality: N/A;   THORACENTESIS     x 2   THORACENTESIS N/A 11/07/2020   Procedure: Alanson Puls;  Surgeon: Karren Burly, MD;  Location: [facility] ENDOSCOPY;  Service: Pulmonary;  Laterality: N/A;  THORACENTESIS N/A 04/06/2021   Procedure: Mathews Robinsons;  Surgeon: Freddi Starr, MD;  Location: [facility] ENDOSCOPY;  Service: Pulmonary;  Laterality: N/A;   UPPER ESOPHAGEAL ENDOSCOPIC ULTRASOUND (EUS) N/A 02/22/2021   Procedure: UPPER ESOPHAGEAL ENDOSCOPIC ULTRASOUND (EUS);  Surgeon: Irving Copas., MD;  Location: [place];  Service: Gastroenterology;  Laterality: N/A;    Social History   Socioeconomic History   Marital status: Widowed    Spouse name: Not on file   Number of children: Not on file   Years of  education: Not on file   Highest education level: Not on file  Occupational History   Not on file  Tobacco Use   Smoking status: Former    Types: Cigarettes   Smokeless tobacco: Never  Vaping Use   Vaping Use: Never used  Substance and Sexual Activity   Alcohol use: Not Currently   Drug use: Not Currently   Sexual activity: Not Currently  Other Topics Concern   Not on file  Social History Narrative   Not on file   Social Determinants of Health   Financial Resource Strain: Low Risk    Difficulty of Paying Living Expenses: Not very hard  Food Insecurity: No Food Insecurity   Worried About Running Out of Food in the Last Year: Never true   Ran Out of Food in the Last Year: Never true  Transportation Needs: No Transportation Needs   Lack of Transportation (Medical): No   Lack of Transportation (Non-Medical): No  Physical Activity: Not on file  Stress: Not on file  Social Connections: Socially Isolated   Frequency of Communication with Friends and Family: Three times a week   Frequency of Social Gatherings with Friends and Family: Three times a week   Attends Religious Services: Never   Active Member of Clubs or Organizations: No   Attends Archivist Meetings: Never   Marital Status: Widowed  Human resources officer Violence: Not on file    Allergies  Allergen Reactions   Black [place] Flavor Hives   Shellfish Allergy Itching    Makes throat itch     Family History  Problem Relation Age of Onset   Seizures Father    CAD Father    Diabetes Sister    Lupus Sister    Hypertension Sister    Colon cancer Neg Hx    Esophageal cancer Neg Hx    Pancreatic cancer Neg Hx    Stomach cancer Neg Hx    Inflammatory bowel disease Neg Hx    Liver disease Neg Hx    Rectal cancer Neg Hx     Prior to Admission medications   Medication Sig Start Date End Date Taking? Authorizing Provider  acetaminophen (TYLENOL) 325 MG tablet Take 2 tablets (650 mg total) by mouth every 4  (four) hours as needed for headache or mild pain. 11/05/19   Kinnie Feil, MD  albuterol (VENTOLIN HFA) 108 (90 Base) MCG/ACT inhaler Inhale 2 puffs into the lungs every 6 (six) hours as needed for wheezing or shortness of breath. 11/30/19   [name], NP  allopurinol (ZYLOPRIM) 300 MG tablet Take 1 tablet (300 mg total) by mouth at bedtime. 07/20/21   [name], MD  apixaban (ELIQUIS) 5 MG TABS tablet Take 1 tablet (5 mg total) by mouth 2 (two) times daily. 07/20/21   [name], MD  atorvastatin (LIPITOR) 80 MG tablet Take 1 tablet (80 mg total) by mouth at bedtime. 07/20/21   [name],  MD  cetirizine (ZYRTEC) 10 MG tablet Take 10 mg by mouth daily as needed for allergies.    [provider]  cinacalcet (SENSIPAR) 30 MG tablet Take 30 mg by mouth daily with supper.  04/18/20   [provider]  ferric citrate (AURYXIA) 1 GM 210 MG(Fe) tablet Take 420 mg by mouth 3 (three) times daily with meals.    [provider]  ferrous sulfate 324 MG TBEC Take 324 mg by mouth daily.    [provider]  furosemide (LASIX) 40 MG tablet Take 53m twice daily on non dialysis days. 08/20/21   MLarey Dresser MD  gabapentin (NEURONTIN) 100 MG capsule TAKE 1 CAPSULE BY MOUTH THREE TIMES DAILY 08/29/21   FGildardo Pounds NP  lidocaine-prilocaine (EMLA) cream Apply 1 application topically See admin instructions. Apply topically to port access one hour prior to dialysis - Monday, Wednesday, Friday, Saturday 04/07/20   [provider]  metoprolol tartrate (LOPRESSOR) 25 MG tablet Take 0.5 tablets (12.5 mg total) by mouth 2 (two) times daily. 08/21/21   MLarey Dresser MD  multivitamin (RENA-VIT) TABS tablet Take 1 tablet by mouth at bedtime. 07/31/21   UCristal Deer MD  Nutritional Supplements (,FEEDING SUPPLEMENT, PROSOURCE PLUS) liquid Take 30 mLs by mouth 2 (two) times daily between meals. 11/19/20   Pahwani, RMichell Heinrich MD  polyethylene glycol  (MIRALAX / GLYCOLAX) 17 g packet Take 17 g by mouth daily as needed for moderate constipation. 07/31/21   UCristal Deer MD  triamcinolone 0.1% oint-Eucerin equivalent cream 1:1 mixture Apply topically 3 (three) times daily as needed. APPLY TO DRY AFFECTED AREAS AS NEEDED 3 TIMES ADAY Patient taking differently: Apply 1 application topically 3 (three) times daily as needed for irritation, itching or rash. 04/24/21   EKerin Perna NP  vitamin B-12 (CYANOCOBALAMIN) 1000 MCG tablet Take 1,000 mcg by mouth daily.    [provider]    Physical Exam: Vitals:   09/05/21 1230 09/05/21 1245 09/05/21 1300 09/05/21 1330  BP: 102/61 102/60 95/73 (!) 92/57  Pulse: (!) 107 (!) 105 90 94  Resp: _0 Temp:      TempSrc:      SpO2: 98% 98% 100% 98%  Height:         General:  Appears chronically ill, frail Eyes:  PERRL, EOMI, normal lids, iris ENT:  grossly normal hearing, lips & tongue, mmm Neck:  no LAD, masses or thyromegaly Cardiovascular:  Irregularly irregular with tachycardia, no m/r/g. 2+ LE edema.  Respiratory:   Scattered rhonchi.  Normal respiratory effort. Abdomen:  soft, NT, ND Skin:  no rash or induration seen on limited exam Musculoskeletal:  grossly normal tone BUE/BLE, good ROM, no bony abnormality Psychiatric:  somnolent/blunted mood and affect, speech sparse but appropriate Neurologic:  unable to effectively perform    Radiological Exams on Admission: Independently reviewed - see discussion in A/P where applicable  DG Chest 2 View  Result Date: 09/05/2021 CLINICAL DATA:  Chest and back pain with shortness of breath. EXAM: CHEST - 2 VIEW COMPARISON:  Chest x-ray 07/20/2021 FINDINGS: The heart is enlarged but appears stable. Stable tortuosity and calcification of the thoracic aorta. New moderate-sized right-sided pleural effusion with overlying atelectasis. The left lung remains relatively clear. IMPRESSION: New moderate-sized right pleural effusion with  overlying atelectasis. Stable cardiac enlargement. Electronically Signed   By: PMarijo SanesM.D.   On: 09/05/2021 07:51   CT Angio Chest PE W and/or Wo Contrast  Result Date: 09/05/2021 CLINICAL DATA:  PE suspected. High probability. History of carcinoid tumor. EXAM: CT ANGIOGRAPHY CHEST WITH CONTRAST TECHNIQUE: Multidetector CT imaging of the chest was performed using the standard protocol during bolus administration of intravenous contrast. Multiplanar CT image reconstructions and MIPs were obtained to evaluate the vascular anatomy. CONTRAST:  55m OMNIPAQUE IOHEXOL 350 MG/ML SOLN COMPARISON:  Chest radiograph concurrent.  CT chest, 07/06/2021. FINDINGS: Cardiovascular: Satisfactory opacification of the pulmonary arteries to the segmental level. No evidence of pulmonary embolism. Cardiomegaly, with RIGHT heart enlargement. No pericardial effusion. Mediastinum/Nodes: No enlarged mediastinal, hilar, or axillary lymph nodes. Thyroid gland, trachea, and esophagus demonstrate no significant findings. Lungs/Pleura: Increased number and size of multiple pulmonary nodules, since recent 08/22 comparison. Persistent small-to-moderate volume of RIGHT pleural effusion, with adjacent and dependent atelectasis. No pneumothorax. Upper Abdomen: No acute abnormality within the imaged portion. Musculoskeletal: Coarse RIGHT breast calcification is unchanged. The RIGHT axilla adenopathy, with biopsy clip at the dominant node. Lytic osseous lesion at the RIGHT lateral fourth rib, with nondisplaced pathologic fracture. Additional chronic bilateral rib deformities. Degenerative changes of the imaged thoracic spine, including multilevel anterior osteophytes. Review of the MIP images confirms the above findings. IMPRESSION: 1. No segmental or larger pulmonary embolus. 2. Persistent moderate volume RIGHT pleural effusion with atelectasis. 3. Increased number and size of multifocal pulmonary nodules since August 22, with findings  favored metastatic. 4. RIGHT lateral fourth rib lytic lesion with nondisplaced pathologic fracture. 5. Cardiomegaly with RIGHT heart enlargement. Additional chronic and senescent changes, as above. Electronically Signed   By: JMichaelle BirksM.D.   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 CONTRAST:  833mOMNIPAQUE IOHEXOL 350 MG/ML SOLN COMPARISON:  07/06/2021 FINDINGS: Lower chest: Moderate to large right pleural effusion associated atelectasis or consolidation. Cardiomegaly. Hepatobiliary: Numerous liver lesions of varying sizes, increased in size and number compared to prior examination, largest almost entirely replacing the left lobe measuring 8.4 x 6.5 cm, previously 3.5 x 3.5 cm when measured similarly (series 6, image 20). Additional index lesion of the posterior right lobe of the liver measures 2.9 x 2.7 cm, previously 1.7 x 1.7 cm (series 6, image 26). Tiny gallstones and or sludge in the dependent gallbladder. No gallbladder wall thickening. No biliary dilatation. Pancreas: Unremarkable. No pancreatic ductal dilatation or surrounding inflammatory changes. Spleen: Normal in size without significant abnormality. Adrenals/Urinary Tract: Benign, fatty attenuation left adrenal nodule (series 5, image 7). Kidneys are normal, without renal calculi, solid lesion, or hydronephrosis. Bladder is unremarkable. Stomach/Bowel: Stomach is within normal limits. Appendix appears normal. No evidence of bowel wall thickening, distention, or inflammatory changes. Vascular/Lymphatic: Aortic atherosclerosis. No enlarged abdominal or pelvic lymph nodes. Reproductive: Status post hysterectomy. Other: Anasarca. Small  volume ascites throughout the abdomen and pelvis, increased compared to prior examination. Some evidence of peritoneal thickening in the pelvis (series 6, image 71). Musculoskeletal: Diffusely lytic osseous metastatic disease throughout, status post vertebral cement augmentation of L4. IMPRESSION: 1. Numerous liver lesions of varying sizes, increased in size and number compared to prior examination, consistent with worsened hepatic metastatic disease. 2. Small volume ascites throughout the abdomen and pelvis, increased compared to prior examination. Some evidence of peritoneal thickening in the pelvis. Findings are highly suspicious for peritoneal  metastatic disease and malignant ascites. 3. Diffusely lytic osseous metastatic disease throughout, status post vertebral cement augmentation of L4. 4. Moderate to large right pleural effusion associated atelectasis or consolidation, incompletely imaged. 5. Cardiomegaly. Aortic Atherosclerosis (ICD10-I70.0). Electronically Signed   By: Delanna Ahmadi M.D.   On: 09/05/2021 10:37   VAS [place] LOWER EXTREMITY VENOUS (DVT) (7a-7p)  Result Date: 09/05/2021  Lower Venous DVT Study Patient Name:  Melissa Adams [facility]  Date of Exam:   09/05/2021 Medical Rec #: [id number]          Accession #:    [id number] Date of Birth: [birth year]         Patient Gender: F Patient Age:   64 years Exam [facility]:  [facility] Procedure:      VAS [place] LOWER EXTREMITY VENOUS (DVT) Referring Phys: JULIE HAVILAND --------------------------------------------------------------------------------  Indications: Edema.  Comparison Study: 10-13-2019 Prior bilateral lower extremity venous was negative                   for DVT. Performing Technologist: Darlin Coco RDMS, RVT  Examination Guidelines: A complete evaluation includes B-mode imaging, spectral Doppler, color Doppler, and power Doppler as needed of all accessible portions of each vessel. Bilateral testing is considered an integral part of a  complete examination. Limited examinations for reoccurring indications may be performed as noted. The reflux portion of the exam is performed with the patient in reverse Trendelenburg.  +-----+---------------+---------+-----------+----------+-----------------------+ RIGHTCompressibilityPhasicitySpontaneityPropertiesThrombus Aging          +-----+---------------+---------+-----------+----------+-----------------------+ CFV                                               Unable to access due to                                                   patient positioning     +-----+---------------+---------+-----------+----------+-----------------------+   +---------+---------------+---------+-----------+----------+--------------+ LEFT     CompressibilityPhasicitySpontaneityPropertiesThrombus Aging +---------+---------------+---------+-----------+----------+--------------+ CFV      Full           Yes      Yes                                 +---------+---------------+---------+-----------+----------+--------------+ SFJ      Full                                                        +---------+---------------+---------+-----------+----------+--------------+ FV Prox  Full                                                        +---------+---------------+---------+-----------+----------+--------------+ FV Mid   Full                                                        +---------+---------------+---------+-----------+----------+--------------+  FV DistalFull                                                        +---------+---------------+---------+-----------+----------+--------------+ PFV      Full                                                        +---------+---------------+---------+-----------+----------+--------------+ POP      Full           Yes      Yes                                  +---------+---------------+---------+-----------+----------+--------------+ PTV      Full                                                        +---------+---------------+---------+-----------+----------+--------------+ PERO     Full                                                        +---------+---------------+---------+-----------+----------+--------------+    Summary: LEFT: - There is no evidence of deep vein thrombosis in the lower extremity.  - No cystic structure found in the popliteal fossa.  *See table(s) above for measurements and observations.    Preliminary     EKG: Independently reviewed.  Afib with rate 133; low voltage with no evidence of acute ischemia   Labs on Admission: I have personally reviewed the available labs and imaging studies at the time of the admission.  Pertinent labs:   Na++ 132 BUN 39/Creatinine 5.09/GFR 8 - stable AP 485; 415 on 9/15 Albumin 2.9 AST 144/ALT 49/Bili 2.4 - worse since 9/15 HS troponin 85, 79 WBC 6.9 Hgb 9.9 Platelets 120 COVID/flu negative   Assessment/Plan Principal Problem:   Atrial fibrillation with RVR (HCC) Active Problems:   Pleural effusion   Metastatic breast cancer (HCC)   ESRD on dialysis (HCC)   Benign neuroendocrine tumor of small intestine   Essential hypertension   Class 2 obesity due to excess calories with body mass index (BMI) of 36.0 to 36.9 in adult   * Atrial fibrillation with RVR ([place]) -Patient with known afib, presenting with chest pain/SOB, found to be in RVR.  -She has recurrent pleural effusion as well as widely metastatic breast cancer, either of which could be driving the RVR. -She was given Diltiazem and developed hypotension and so was changed to Amiodarone infusion. -Will admit to progressive care. -HS troponin mildly elevated with negative delta; unlikely ACS. -Will consult cardiology  -Continue home Eliquis.  Pleural effusion -Patient with prior pleural effusion requiring  thoracentesis in August -Now presenting with recurrent moderate right-sided effusion -She is having SOB which is likely related to the effusion at least in part -Hypoxic  to 87%, wears home O2 only qhs but currently needing -Will consult IR -Placement of Pleurex catheter would be desired since this is likely a malignant effusion and so ongoing recurrence is probable  Metastatic breast cancer ([place]) -Widely metastatic breast cancer, appears to be aggressive -CTA of chest shows increased number and size of pulmonary nodules, likely metastatic disease, as well as met to R lateral 4th rib -CT A/P with increase in number and size of liver lesions c/w worsening hepatic metastatic disease; increased ascites and other findings c/w peritoneal metastatic disease and malignant ascites -Overall, this portends a very poor prognosis -Dr. Lorenso Courier notified of admission and will consult -I have discussed options for more aggressive care vs. Transition to comfort only and daughter appears to be leaning towards comfort - but does want drainage of effusion -Palliative care consulted, ?candidate for [facility] if she stops HD  ESRD on dialysis [facility]) -Patient on chronic MWF HD, not dialyzed today due to symptoms -Nephrology prn order set utilized -She does not appear to be volume overloaded or otherwise in need of acute HD -Nephrology is aware that patient will need HD -May transition to comfort measures and if so would likely stop HD at that time -Continue Sensipar, Renavite, Auryxia, FeSO4  Essential hypertension -Hold home Lopressor while on drip - and given hypotension when Dilt was started -Will cover with prn IV hydralazine    Body mass index is 36.44 kg/m.  Note: This patient has been tested and is negative for the novel coronavirus COVID-19. The patient has been fully vaccinated against COVID-19.   Level of care: Progressive DVT prophylaxis: Eliquis Code Status:  DNR - confirmed with  patient/family Family Communication: None present; I spoke with the patient's daughter by telephone at the time of admission. Disposition Plan:  The patient is from: home  Anticipated d/c is to: be determined  Anticipated d/c date will depend on clinical response to treatment, likely 2-3 days  Patient is currently: acutely ill Consults called: Nephrology; IR; Cardiology; Oncology; Palliative Care  Admission status:  Admit - It is my clinical opinion that admission to INPATIENT is reasonable and necessary because of the expectation that this patient will require hospital care that crosses at least 2 midnights to treat this condition based on the medical complexity of the problems presented.  Given the aforementioned information, the predictability of an adverse outcome is felt to be significant.    Karmen Bongo MD [facility]   How to contact the [facility] Attending or Consulting provider [place] or covering provider during after hours [place], for this patient?  Check the care team in [facility] and look for a) attending/consulting [facility] provider listed and b) the [facility] team listed Log into [email or web address] and use [place]'s universal password to access. If you do not have the password, please contact the hospital operator. Locate the [facility] provider you are looking for under [facility] and page to a number that you can be directly reached. If you still have difficulty reaching the provider, please page the [facility] (Director on Call) for the Hospitalists listed on amion for assistance.   09/05/2021, 2:30 PM

## 2021-09-05 NOTE — Assessment & Plan Note (Signed)
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 Lorenso Courier notified of admission and will consult -I have discussed options for more aggressive care vs. Transition to comfort only and daughter appears to be leaning towards comfort - but does want drainage of effusion -Palliative care consulted, ?candidate for [facility] if she stops HD

## 2021-09-05 NOTE — ED Provider Notes (Signed)
[facility] DE[facility]MENT Provider Note   CSN: [id number] Arrival date & time: 09/05/21  8588     History Chief Complaint  Patient presents with   Chest Pain    Melissa Adams is a 75 y.o. female.  Pt presents to the ED today with cp.  Pt said cp started last night.  She went to dialysis for her usual session and was sent here.  She has not missed any sessions.   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  Primary source possibly breast, but this is unclear.  Pt has been getting palliative radiation weekly to her back for her pain.  Last treatment was on 10/7.  Pt has not taken any of her meds yest today.  EMS did give her an asa en route.  Pt does have a hx of afib and was in afib with RVR upon arrival.  Pt denies any f/c.  No sob.  No n/v. Pt also complains of abdominal pain.  She said she feels knots in her abdomen.  She also has swelling in her legs.  Left more than right.      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   Cardiomyopathy ([place])    CHF (congestive heart failure) ([place])    Chronic kidney disease    Edema    History of hemodialysis    Hyperlipidemia    Hypertension    Hypotension    on midodrine (as of 06/15/21)   Pulmonary hypertension (HCC)    Recurrent right pleural effusion    As of 06/15/21: s/p thoracenteses 05/05/20, 06/11/20, 08/17/20, 11/18/20, 04/06/21   Sleep apnea    Tricuspid regurgitation     Patient Active Problem List   Diagnosis Date Noted   Atrial fibrillation with RVR ([place]) 09/05/2021   Breast mass, right 08/16/2021   Severe protein-calorie malnutrition ([place]) 08/16/2021   Bone metastasis ([place]) 08/09/2021   Pathologic compression fracture of lumbar vertebra ([place]) 07/18/2021   Metastatic adenocarcinoma to liver ([place]) 07/18/2021   Abnormal findings on esophagogastroduodenoscopy  (EGD) 01/27/2021   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   Benign carcinoid tumor of duodenum    Benign neoplasm of colon    Cirrhosis of liver with ascites ([place])    Melena    GI bleed 11/13/2020   Upper GI bleeding 50/27/7412   Chronic systolic CHF (congestive heart failure) ([place]) 11/13/2020   Acute blood loss anemia 11/13/2020   Chronic anticoagulation 11/13/2020   Pleural effusion 06/11/2020   Aortic atherosclerosis ([place]) 06/11/2020   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   Acute on chronic systolic heart failure (HCC)    Pressure injury of skin 10/22/2019   Dyspnea    COVID-19 virus infection    CHF exacerbation ([place]) 10/12/2019   Acute CHF (congestive heart failure) ([place]) 10/11/2019   Atrial fibrillation, chronic ([place]) 10/11/2019   Essential hypertension 10/11/2019   Sleep apnea 10/11/2019   AKI (acute kidney injury) ([place]) 10/11/2019   Gout 10/11/2019   Anemia 10/11/2019    Past Surgical History:  Procedure Laterality Date   A/V FISTULAGRAM Left 05/23/2020   Procedure: A/V FISTULAGRAM;  Surgeon: [name], MD;  Location: [place] [facility];  Service: Cardiovascular;  Laterality: Left;   A/V FISTULAGRAM N/A 12/21/2020   Procedure: A/V FISTULAGRAM -  Left Upper Arm;  Surgeon: [name], MD;  Location: [place] [facility];  Service: Cardiovascular;  Laterality: N/A;   ABDOMINAL HYSTERECTOMY     AV FISTULA PLACEMENT Left 11/04/2019   Procedure: Insertion Of Arteriovenous (Av) Gore-Tex Graft Arm;  Surgeon: [name], MD;  Location: [place];  Service: Vascular;  Laterality: Left;   BIOPSY  11/14/2020   Procedure: BIOPSY;  Surgeon: Yetta Flock, MD;  Location: [place];  Service: Gastroenterology;;   BIOPSY  11/16/2020   Procedure: BIOPSY;  Surgeon: Yetta Flock, MD;  Location: [place];  Service: Gastroenterology;;   BIOPSY  02/22/2021   Procedure: BIOPSY;  Surgeon: Irving Copas., MD;  Location: [facility]  ENDOSCOPY;  Service: Gastroenterology;;   CARDIAC CATHETERIZATION     CARDIOVERSION N/A 10/26/2019   Procedure: CARDIOVERSION;  Surgeon: [name], MD;  Location: [facility] ENDOSCOPY;  Service: Cardiovascular;  Laterality: N/A;   COLONOSCOPY W/ BIOPSIES AND POLYPECTOMY     COLONOSCOPY WITH PROPOFOL N/A 11/16/2020   Procedure: COLONOSCOPY WITH PROPOFOL;  Surgeon: Yetta Flock, MD;  Location: [place];  Service: Gastroenterology;  Laterality: N/A;   ENDOSCOPIC MUCOSAL RESECTION N/A 02/22/2021   Procedure: ENDOSCOPIC MUCOSAL RESECTION;  Surgeon: Rush Landmark Telford Nab., MD;  Location: [place];  Service: Gastroenterology;  Laterality: N/A;   ESOPHAGOGASTRODUODENOSCOPY (EGD) WITH PROPOFOL Left 11/14/2020   Procedure: ESOPHAGOGASTRODUODENOSCOPY (EGD) WITH PROPOFOL;  Surgeon: Yetta Flock, MD;  Location: [place];  Service: Gastroenterology;  Laterality: Left;   ESOPHAGOGASTRODUODENOSCOPY (EGD) WITH PROPOFOL N/A 02/22/2021   Procedure: ESOPHAGOGASTRODUODENOSCOPY (EGD) WITH PROPOFOL;  Surgeon: Rush Landmark Telford Nab., MD;  Location: [place];  Service: Gastroenterology;  Laterality: N/A;   FISTULA SUPERFICIALIZATION Left 01/21/2020   Procedure: FISTULA SUPERFICIALIZATION OF FISTULA;  Surgeon: [name], MD;  Location: [place];  Service: Vascular;  Laterality: Left;   FISTULA SUPERFICIALIZATION Left 01/26/75   Procedure: PLICATION OF LEFT UPPER EXTREMITY ARTERIOVENOUS FISTULA;  Surgeon: Waynetta [name], MD;  Location: [place];  Service: Vascular;  Laterality: Left;   GIVENS CAPSULE STUDY N/A 11/16/2020   Procedure: GIVENS CAPSULE STUDY;  Surgeon: Yetta Flock, MD;  Location: [place];  Service: Gastroenterology;  Laterality: N/A;   HEMOSTASIS CLIP PLACEMENT  02/22/2021   Procedure: HEMOSTASIS CLIP PLACEMENT;  Surgeon: Rush Landmark Telford Nab., MD;  Location: [place];  Service: Gastroenterology;;   INSERTION OF DIALYSIS CATHETER Right 11/04/2019    Procedure: CONVERT TEMPORARY DIALYSIS CATHETER TO TUNNELED DIALYSIS CATHETER Right Internal Jugular.;  Surgeon: [name], MD;  Location: [place];  Service: Vascular;  Laterality: Right;   IR BONE TUMOR(S)RF ABLATION  07/23/2021   IR KYPHO LUMBAR INC FX REDUCE BONE BX UNI/BIL CANNULATION INC/IMAGING  07/23/2021   IR THORACENTESIS ASP PLEURAL SPACE W/IMG GUIDE  08/17/2020   IR THORACENTESIS ASP PLEURAL SPACE W/IMG GUIDE  07/20/2021   MULTIPLE TOOTH EXTRACTIONS     POLYPECTOMY  11/16/2020   Procedure: POLYPECTOMY;  Surgeon: Yetta Flock, MD;  Location: [place];  Service: Gastroenterology;;   RADIOLOGY WITH ANESTHESIA N/A 06/19/2021   Procedure: MRI WITH ANESTHESIA  ,LIVER WITH AND WITHOUT CONTRAST;  Surgeon: Radiologist, Medication, MD;  Location: [place];  Service: Radiology;  Laterality: N/A;   RADIOLOGY WITH ANESTHESIA N/A 07/20/2021   Procedure: MRI WITH ANESTHESIA LOWER SPINE WITHOUT CONTRAST;  Surgeon: Radiologist, Medication, MD;  Location: [place];  Service: Radiology;  Laterality: N/A;   REVISION OF ARTERIOVENOUS GORETEX GRAFT Left 01/21/2020   Procedure: REVISION OF ARTERIOVENOUS FISTULA WITH SIDE  BRANCH LIGATION;  Surgeon: [name], MD;  Location: [place];  Service: Vascular;  Laterality: Left;   SUBMUCOSAL LIFTING INJECTION  02/22/2021   Procedure: SUBMUCOSAL LIFTING INJECTION;  Surgeon: Irving Copas., MD;  Location: [place];  Service: Gastroenterology;;   SUBMUCOSAL TATTOO INJECTION  02/22/2021   Procedure: SUBMUCOSAL TATTOO INJECTION;  Surgeon: Irving Copas., MD;  Location: [place];  Service: Gastroenterology;;   TEE WITHOUT CARDIOVERSION N/A 10/26/2019   Procedure: TRANSESOPHAGEAL ECHOCARDIOGRAM (TEE);  Surgeon: [name], MD;  Location: [facility] ENDOSCOPY;  Service: Cardiovascular;  Laterality: N/A;   THORACENTESIS     x 2   THORACENTESIS N/A 11/07/2020   Procedure: Mathews Robinsons;  Surgeon: [name], MD;  Location:  [facility] ENDOSCOPY;  Service: Pulmonary;  Laterality: N/A;   THORACENTESIS N/A 04/06/2021   Procedure: THORACENTESIS;  Surgeon: Freddi Starr, MD;  Location: [facility] ENDOSCOPY;  Service: Pulmonary;  Laterality: N/A;   UPPER ESOPHAGEAL ENDOSCOPIC ULTRASOUND (EUS) N/A 02/22/2021   Procedure: UPPER ESOPHAGEAL ENDOSCOPIC ULTRASOUND (EUS);  Surgeon: Irving Copas., MD;  Location: [place];  Service: Gastroenterology;  Laterality: N/A;     OB History   No obstetric history on file.     Family History  Problem Relation Age of Onset   Seizures Father    CAD Father    Diabetes Sister    Lupus Sister    Hypertension Sister    Colon cancer Neg Hx    Esophageal cancer Neg Hx    Pancreatic cancer Neg Hx    Stomach cancer Neg Hx    Inflammatory bowel disease Neg Hx    Liver disease Neg Hx    Rectal cancer Neg Hx     Social History   Tobacco Use   Smoking status: Former    Types: Cigarettes   Smokeless tobacco: Never  Vaping Use   Vaping Use: Never used  Substance Use Topics   Alcohol use: Not Currently   Drug use: Not Currently    Home Medications Prior to Admission medications   Medication Sig Start Date End Date Taking? Authorizing Provider  acetaminophen (TYLENOL) 325 MG tablet Take 2 tablets (650 mg total) by mouth every 4 (four) hours as needed for headache or mild pain. 11/05/19   Kinnie Feil, MD  albuterol (VENTOLIN HFA) 108 (90 Base) MCG/ACT inhaler Inhale 2 puffs into the lungs every 6 (six) hours as needed for wheezing or shortness of breath. 11/30/19   [name], NP  allopurinol (ZYLOPRIM) 300 MG tablet Take 1 tablet (300 mg total) by mouth at bedtime. 07/20/21   [name], MD  apixaban (ELIQUIS) 5 MG TABS tablet Take 1 tablet (5 mg total) by mouth 2 (two) times daily. 07/20/21   [name], MD  atorvastatin (LIPITOR) 80 MG tablet Take 1 tablet (80 mg total) by mouth at bedtime. 07/20/21   [name], MD  cetirizine (ZYRTEC) 10 MG  tablet Take 10 mg by mouth daily as needed for allergies.    [provider]  cinacalcet (SENSIPAR) 30 MG tablet Take 30 mg by mouth daily with supper.  04/18/20   [provider]  ferric citrate (AURYXIA) 1 GM 210 MG(Fe) tablet Take 420 mg by mouth 3 (three) times daily with meals.    [provider]  ferrous sulfate 324 MG TBEC Take 324 mg by mouth daily.    [provider]  furosemide (LASIX) 40 MG tablet Take 80mg  twice daily on non dialysis  days. 08/20/21   [name], MD  gabapentin (NEURONTIN) 100 MG capsule TAKE 1 CAPSULE BY MOUTH THREE TIMES DAILY 08/29/21   Gildardo Pounds, NP  lidocaine-prilocaine (EMLA) cream Apply 1 application topically See admin instructions. Apply topically to port access one hour prior to dialysis - Monday, Wednesday, Friday, Saturday 04/07/20   [provider]  metoprolol tartrate (LOPRESSOR) 25 MG tablet Take 0.5 tablets (12.5 mg total) by mouth 2 (two) times daily. 08/21/21   [name], MD  multivitamin (RENA-VIT) TABS tablet Take 1 tablet by mouth at bedtime. 07/31/21   Cristal Deer, MD  Nutritional Supplements (,FEEDING SUPPLEMENT, PROSOURCE PLUS) liquid Take 30 mLs by mouth 2 (two) times daily between meals. 11/19/20   Pahwani, Michell Heinrich, MD  polyethylene glycol (MIRALAX / GLYCOLAX) 17 g packet Take 17 g by mouth daily as needed for moderate constipation. 07/31/21   Cristal Deer, MD  triamcinolone 0.1% oint-Eucerin equivalent cream 1:1 mixture Apply topically 3 (three) times daily as needed. APPLY TO DRY AFFECTED AREAS AS NEEDED 3 TIMES ADAY Patient taking differently: Apply 1 application topically 3 (three) times daily as needed for irritation, itching or rash. 04/24/21   [name], NP  vitamin B-12 (CYANOCOBALAMIN) 1000 MCG tablet Take 1,000 mcg by mouth daily.    [provider]    Allergies    Black walnut flavor and Shellfish allergy  Review of Systems   Review of Systems   Respiratory:  Positive for shortness of breath.   Cardiovascular:  Positive for chest pain, palpitations and leg swelling.  All other systems reviewed and are negative.  Physical Exam Updated Vital Signs BP (!) 98/57   Pulse 95   Temp 98.5 F (36.9 C) (Oral)   Resp 12   Ht 5\' 3"  (1.6 m)   SpO2 100%   BMI 36.44 kg/m   Physical Exam Vitals and nursing note reviewed.  Constitutional:      Appearance: She is well-developed.  HENT:     Head: Normocephalic and atraumatic.  Eyes:     Extraocular Movements: Extraocular movements intact.     Pupils: Pupils are equal, round, and reactive to light.  Cardiovascular:     Rate and Rhythm: Tachycardia present. Rhythm irregular.     Heart sounds: Normal heart sounds.  Pulmonary:     Effort: Pulmonary effort is normal.     Breath sounds: Normal breath sounds.  Abdominal:     General: Bowel sounds are normal.     Palpations: Abdomen is soft.  Musculoskeletal:     Cervical back: Normal range of motion and neck supple.     Right lower leg: Edema present.     Left lower leg: Edema present.     Comments: Left more swollen than right  Skin:    General: Skin is warm.     Capillary Refill: Capillary refill takes less than 2 seconds.  Neurological:     General: No focal deficit present.     Mental Status: She is alert and oriented to person, place, and time.  Psychiatric:        Mood and Affect: Mood normal.        Behavior: Behavior normal.    ED Results / Procedures / Treatments   Labs (all labs ordered are listed, but only abnormal results are displayed) Labs Reviewed  BASIC METABOLIC PANEL - Abnormal; Notable for the following components:      Result Value   Sodium 132 (*)  Chloride 92 (*)    BUN 39 (*)    Creatinine, Ser 5.09 (*)    Calcium 8.8 (*)    GFR, Estimated 8 (*)    All other components within normal limits  CBC - Abnormal; Notable for the following components:   RBC 3.15 (*)    Hemoglobin 9.9 (*)    HCT 32.3  (*)    MCV 102.5 (*)    RDW 19.5 (*)    Platelets 120 (*)    All other components within normal limits  HEPATIC FUNCTION PANEL - Abnormal; Notable for the following components:   Total Protein 6.1 (*)    Albumin 2.9 (*)    AST 144 (*)    ALT 49 (*)    Alkaline Phosphatase 485 (*)    Total Bilirubin 2.4 (*)    Bilirubin, Direct 1.4 (*)    Indirect Bilirubin 1.0 (*)    All other components within normal limits  TROPONIN I (HIGH SENSITIVITY) - Abnormal; Notable for the following components:   Troponin I (High Sensitivity) 85 (*)    All other components within normal limits  TROPONIN I (HIGH SENSITIVITY) - Abnormal; Notable for the following components:   Troponin I (High Sensitivity) 79 (*)    All other components within normal limits  RESP PANEL BY RT-PCR (FLU A&B, COVID) [id number]    EKG EKG Interpretation  Date/Time:  Wednesday September 05 2021 07:20:57 EDT Ventricular Rate:  133 PR Interval:    QRS Duration: 74 QT Interval:  264 QTC Calculation: 392 R Axis:   124 Text Interpretation: Atrial fibrillation with rapid ventricular response Right axis deviation Low voltage QRS Septal infarct , age undetermined Abnormal ECG Since last tracing rate faster Confirmed by Isla Pence [phone]) on 09/05/2021 8:11:33 AM  Radiology DG Chest 2 View  Result Date: 09/05/2021 CLINICAL DATA:  Chest and back pain with shortness of breath. EXAM: CHEST - 2 VIEW COMPARISON:  Chest x-ray 07/20/2021 FINDINGS: The heart is enlarged but appears stable. Stable tortuosity and calcification of the thoracic aorta. New moderate-sized right-sided pleural effusion with overlying atelectasis. The left lung remains relatively clear. IMPRESSION: New moderate-sized right pleural effusion with overlying atelectasis. Stable cardiac enlargement. Electronically Signed   By: Marijo Sanes M.D.   On: 09/05/2021 07:51   CT Angio Chest PE W and/or Wo Contrast  Result Date: 09/05/2021 CLINICAL DATA:  PE suspected. High  probability. History of carcinoid tumor. EXAM: CT ANGIOGRAPHY CHEST WITH CONTRAST TECHNIQUE: Multidetector CT imaging of the chest was performed using the standard protocol during bolus administration of intravenous contrast. Multiplanar CT image reconstructions and MIPs were obtained to evaluate the vascular anatomy. CONTRAST:  61mL OMNIPAQUE IOHEXOL 350 MG/ML SOLN COMPARISON:  Chest radiograph concurrent.  CT chest, 07/06/2021. FINDINGS: Cardiovascular: Satisfactory opacification of the pulmonary arteries to the segmental level. No evidence of pulmonary embolism. Cardiomegaly, with RIGHT heart enlargement. No pericardial effusion. Mediastinum/Nodes: No enlarged mediastinal, hilar, or axillary lymph nodes. Thyroid gland, trachea, and esophagus demonstrate no significant findings. Lungs/Pleura: Increased number and size of multiple pulmonary nodules, since recent 08/22 comparison. Persistent small-to-moderate volume of RIGHT pleural effusion, with adjacent and dependent atelectasis. No pneumothorax. Upper Abdomen: No acute abnormality within the imaged portion. Musculoskeletal: Coarse RIGHT breast calcification is unchanged. The RIGHT axilla adenopathy, with biopsy clip at the dominant node. Lytic osseous lesion at the RIGHT lateral fourth rib, with nondisplaced pathologic fracture. Additional chronic bilateral rib deformities. Degenerative changes of the imaged thoracic spine, including multilevel anterior osteophytes. Review  of the MIP images confirms the above findings. IMPRESSION: 1. No segmental or larger pulmonary embolus. 2. Persistent moderate volume RIGHT pleural effusion with atelectasis. 3. Increased number and size of multifocal pulmonary nodules since August 22, with findings favored metastatic. 4. RIGHT lateral fourth rib lytic lesion with nondisplaced pathologic fracture. 5. Cardiomegaly with RIGHT heart enlargement. Additional chronic and senescent changes, as above. Electronically Signed   By: Michaelle Birks M.D.   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 CONTRAST:  38mL OMNIPAQUE IOHEXOL 350 MG/ML SOLN COMPARISON:  07/06/2021 FINDINGS: Lower chest: Moderate to large right pleural effusion associated atelectasis or consolidation. Cardiomegaly. Hepatobiliary: Numerous liver lesions of varying sizes, increased in size and number compared to prior examination, largest almost entirely replacing the left lobe measuring 8.4 x 6.5 cm, previously 3.5 x 3.5 cm when measured similarly (series 6, image 20). Additional index lesion of the posterior right lobe of the liver measures 2.9 x 2.7 cm, previously 1.7 x 1.7 cm (series 6, image 26). Tiny gallstones and or sludge in the dependent gallbladder. No gallbladder wall thickening. No biliary dilatation. Pancreas: Unremarkable. No pancreatic ductal dilatation or surrounding inflammatory changes. Spleen: Normal in size without significant abnormality. Adrenals/Urinary Tract: Benign, fatty attenuation left adrenal nodule (series 5, image 7). Kidneys are normal, without renal calculi, solid lesion, or hydronephrosis. Bladder is unremarkable. Stomach/Bowel: Stomach is within normal limits. Appendix appears normal. No evidence of bowel wall thickening, distention, or inflammatory changes. Vascular/Lymphatic: Aortic atherosclerosis. No enlarged abdominal or pelvic lymph nodes. Reproductive: Status post hysterectomy. Other: Anasarca. Small volume ascites throughout the abdomen and pelvis, increased compared to prior examination. Some evidence of peritoneal thickening in the pelvis (series 6, image 71). Musculoskeletal: Diffusely lytic osseous metastatic disease  throughout, status post vertebral cement augmentation of L4. IMPRESSION: 1. Numerous liver lesions of varying sizes, increased in size and number compared to prior examination, consistent with worsened hepatic metastatic disease. 2. Small volume ascites throughout the abdomen and pelvis, increased compared to prior examination. Some evidence of peritoneal thickening in the pelvis. 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 3. Diffusely lytic osseous metastatic disease throughout, status post vertebral cement augmentation of L4. 4. Moderate to large right pleural effusion associated atelectasis or consolidation, incompletely imaged. 5. Cardiomegaly. Aortic Atherosclerosis (ICD10-I70.0). Electronically Signed   By: Delanna Ahmadi M.D.   On: 09/05/2021 10:37   VAS [place] LOWER EXTREMITY VENOUS (DVT) (7a-7p)  Result Date: 09/05/2021  Lower Venous DVT Study Patient Name:  Melissa Adams [facility]  Date of Exam:   09/05/2021 Medical Rec #: [id number]          Accession #:    [id number] Date of Birth: [birth year]         Patient Gender: F Patient Age:   32 years Exam Location:  [facility] Procedure:      VAS [place] LOWER EXTREMITY VENOUS (DVT) Referring Phys: Jerre Diguglielmo --------------------------------------------------------------------------------  Indications: Edema.  Comparison Study: 10-13-2019 Prior bilateral lower extremity venous was negative                   for DVT. Performing Technologist: Darlin Coco RDMS, RVT  Examination Guidelines: A complete evaluation includes B-mode imaging, spectral Doppler,  color Doppler, and power Doppler as needed of all accessible portions of each vessel. Bilateral testing is considered an integral part of a complete examination. Limited examinations for reoccurring indications may be performed as noted. The reflux portion of the exam is performed with the patient in reverse Trendelenburg.   +-----+---------------+---------+-----------+----------+-----------------------+ RIGHTCompressibilityPhasicitySpontaneityPropertiesThrombus Aging          +-----+---------------+---------+-----------+----------+-----------------------+ CFV                                               Unable to access due to                                                   patient positioning     +-----+---------------+---------+-----------+----------+-----------------------+   +---------+---------------+---------+-----------+----------+--------------+ LEFT     CompressibilityPhasicitySpontaneityPropertiesThrombus Aging +---------+---------------+---------+-----------+----------+--------------+ CFV      Full           Yes      Yes                                 +---------+---------------+---------+-----------+----------+--------------+ SFJ      Full                                                        +---------+---------------+---------+-----------+----------+--------------+ FV Prox  Full                                                        +---------+---------------+---------+-----------+----------+--------------+ FV Mid   Full                                                        +---------+---------------+---------+-----------+----------+--------------+ FV DistalFull                                                        +---------+---------------+---------+-----------+----------+--------------+ PFV      Full                                                        +---------+---------------+---------+-----------+----------+--------------+ POP      Full           Yes      Yes                                 +---------+---------------+---------+-----------+----------+--------------+  PTV      Full                                                        +---------+---------------+---------+-----------+----------+--------------+ PERO     Full                                                         +---------+---------------+---------+-----------+----------+--------------+    Summary: LEFT: - There is no evidence of deep vein thrombosis in the lower extremity.  - No cystic structure found in the popliteal fossa.  *See table(s) above for measurements and observations.    Preliminary     Procedures Procedures   Medications Ordered in ED Medications  amiodarone (NEXTERONE) 1.8 mg/mL load via infusion 150 mg (150 mg Intravenous Bolus from Bag 09/05/21 1206)    Followed by  amiodarone (NEXTERONE PREMIX) 360-4.14 MG/200ML-% (1.8 mg/mL) IV infusion (60 mg/hr Intravenous New Bag/Given 09/05/21 1206)    Followed by  amiodarone (NEXTERONE PREMIX) 360-4.14 MG/200ML-% (1.8 mg/mL) IV infusion (has no administration in time range)  diltiazem (CARDIZEM) 1 mg/mL load via infusion 20 mg (20 mg Intravenous Bolus from Bag 09/05/21 0839)  sodium chloride 0.9 % bolus 500 mL (0 mLs Intravenous Stopped 09/05/21 0915)  acetaminophen (TYLENOL) tablet 1,000 mg (1,000 mg Oral Given 09/05/21 1014)  iohexol (OMNIPAQUE) 350 MG/ML injection 80 mL (80 mLs Intravenous Contrast Given 09/05/21 0944)  sodium chloride 0.9 % bolus 500 mL (0 mLs Intravenous Stopped 09/05/21 1138)    ED Course  I have reviewed the triage vital signs and the nursing notes.  Pertinent labs & imaging results that were available during my care of the patient were reviewed by me and considered in my medical decision making (see chart for details).    MDM Rules/Calculators/A&P                           Pt is in afib with RVR.  BP soft.  Pt given 20 mg cardizem and put on a drip.  She is given 500 cc NS.  Pt's bp initially ok, but dropped to the 70s.  Pt taken off the drip and given 500 cc more fluid.  Amiodarone drip ordered as she is still in afib.  Cards consulted.  Pt does have a recurrent right pleural effusion.  It looks like it was drained in August.  She has home O2 to wear prn  and is requiring 3L here to keep O2 sats up to the mid-90s.  Covid/Flu neg.  Pt's troponins are slightly elevated, but are flat.   [place] neg for DVT.  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  Pain has improved with tylenol. LFTs are up.  Likely due to liver mets.  Pt was supposed to do dialysis today, but was sent here instead.  K is ok and pt is not in CHF.  Dialysis will need to be done while hospitalized, but not emergently.  Pt d/w Dr. Lorin Mercy ([facility]) for admission.  CRITICAL CARE Performed by: Isla Pence   Total critical care time: 30 minutes  Critical care time was exclusive  of separately billable procedures and treating other patients.  Critical care was necessary to treat or prevent imminent or life-threatening deterioration.  Critical care was time spent personally by me on the following activities: development of treatment plan with patient and/or surrogate as well as nursing, discussions with consultants, evaluation of patient's response to treatment, examination of patient, obtaining history from patient or surrogate, ordering and performing treatments and interventions, ordering and review of laboratory studies, ordering and review of radiographic studies, pulse oximetry and re-evaluation of patient's condition.   Melissa Adams was evaluated in Emergency Department on 09/05/2021 for the symptoms described in the history of present illness. She was evaluated in the context of the global COVID-19 pandemic, which necessitated consideration that the patient might be at risk for infection with the SARS-CoV-2 virus that causes COVID-19. Institutional protocols and algorithms that pertain to the evaluation of patients at risk for COVID-19 are in a state of rapid change based on information released by regulatory bodies including the CDC and federal and state organizations. These policies and algorithms were followed during the patient's care in the ED.   Final  Clinical Impression(s) / ED Diagnoses Final diagnoses:  Atrial fibrillation with RVR (HCC)  Pleural effusion on right  Metastatic malignant neoplasm, unspecified site [facility])  Chest pain, unspecified type  ESRD on hemodialysis [facility])  Peripheral edema    Rx / DC Orders ED Discharge Orders     None        Isla Pence, MD 09/05/21 1222

## 2021-09-05 NOTE — Consult Note (Addendum)
Cardiology Consultation:   Patient ID: Melissa Adams MRN: [id number]; DOB: Apr 09, 1946  Admit date: 09/05/2021 Date of Consult: 09/05/2021  PCP:  [name], NP   [facility] Providers Cardiologist:  [name], MD  Advanced Heart Failure:  [name], MD   Patient Profile:   Melissa Adams is a 75 y.o. 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 Lorin Mercy.  History of Present Illness:   Melissa Adams was seen in [place] with severe MR and TR and was scheduled for valve replacement. Preop cath with nonobstructive disease. She was visiting family and was hospitalized 09/2019 with CHF exacerbation. LVEF was reduced to 20-25% from 40-45% in Sept 2020 on echo at the [place]. She was in Afib RVR with elevated D-dimer and was COVID-19 positive. She required milrinone and was diuresed, but complicated by AKI with CVVH and ultimately progressed to HD. TEE-guided cardioversion was performed, but she did not stay in sinus rhythm. Amiodarone was stopped as she did not maintained NSR. Echo 5/021 with LVEF 30-35%, D-shaped septum and moderately dilated RV, PASP 43, moderate MR, moderate TR.   She had thoracentesis 6/21, 7/21, 9/21, and 2x in 12/21, 06/2021, 07/2021.   She was diagnosed with duodenal carcinoid in the setting of GI bleeding in 12/021. Volume managed with HD, but she still takes lasix on non-HD days. During that hospitalization, toprol was stopped and amiodarone started for rate control.   Echo 10/2020 with LVEF 35-40%, moderate to severe TR, mild MR.  She was last seen by Dr. Aundra Dubin 03/08/21 with plans to seek more volume removal at HD. She was not  tolerant of CPAP.   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 She has been receiving palliative radiation to her back. Primary unknown but suspected breast cancer.   She was brought to [facility] today with CP from HD that started last evening. She was in Afib RVR and given IV cardizem. Unfortunately, she became hypotensive requiring IVF. She is not on an amiodarone gtt with rates in the 100s. She reports chest pain starting 3 days ago. This occurred when she was watching TV and has waxed and waned since then. Not associated with eating or movement. CP in the center of her chest radiates to the left and then to the right sides. No other associated symptoms. She reports shortness of breath when lying flat to sleep, but associates this with needing another thoracentesis. She is unaware of her Afib. She is not having chest pain currently. She is exquisitely tender to palpation on her chest.   Past Medical History:  Diagnosis Date   A-fib [facility])    Allergies    Arthritis    Asthma    Carcinoid tumor of duodenum 11/14/2020   Cardiomyopathy ([place])    CHF (congestive heart failure) (HCC)    Chronic kidney disease    Edema    History of hemodialysis    Hyperlipidemia    Hypertension    Hypotension    on midodrine (as of 06/15/21)   Pulmonary hypertension (HCC)    Recurrent right pleural effusion    As of 06/15/21: s/p thoracenteses 05/05/20, 06/11/20,  08/17/20, 11/18/20, 04/06/21   Sleep apnea    Tricuspid regurgitation     Past Surgical History:  Procedure Laterality Date   A/V FISTULAGRAM Left 05/23/2020   Procedure: A/V FISTULAGRAM;  Surgeon: [name], MD;  Location: [place] CV LAB;  Service: Cardiovascular;  Laterality: Left;   A/V FISTULAGRAM N/A 12/21/2020   Procedure: A/V FISTULAGRAM - Left Upper Arm;  Surgeon: [name], MD;  Location: [place] CV LAB;  Service: Cardiovascular;  Laterality: N/A;   ABDOMINAL HYSTERECTOMY     AV  FISTULA PLACEMENT Left 11/04/2019   Procedure: Insertion Of Arteriovenous (Av) Gore-Tex Graft Arm;  Surgeon: [name], MD;  Location: [place];  Service: Vascular;  Laterality: Left;   BIOPSY  11/14/2020   Procedure: BIOPSY;  Surgeon: Yetta Flock, MD;  Location: [place];  Service: Gastroenterology;;   BIOPSY  11/16/2020   Procedure: BIOPSY;  Surgeon: Yetta Flock, MD;  Location: [place];  Service: Gastroenterology;;   BIOPSY  02/22/2021   Procedure: BIOPSY;  Surgeon: Irving Copas., MD;  Location: [facility] ENDOSCOPY;  Service: Gastroenterology;;   CARDIAC CATHETERIZATION     CARDIOVERSION N/A 10/26/2019   Procedure: CARDIOVERSION;  Surgeon: [name], MD;  Location: [facility] ENDOSCOPY;  Service: Cardiovascular;  Laterality: N/A;   COLONOSCOPY W/ BIOPSIES AND POLYPECTOMY     COLONOSCOPY WITH PROPOFOL N/A 11/16/2020   Procedure: COLONOSCOPY WITH PROPOFOL;  Surgeon: Yetta Flock, MD;  Location: [place];  Service: Gastroenterology;  Laterality: N/A;   ENDOSCOPIC MUCOSAL RESECTION N/A 02/22/2021   Procedure: ENDOSCOPIC MUCOSAL RESECTION;  Surgeon: Rush Landmark Telford Nab., MD;  Location: [place];  Service: Gastroenterology;  Laterality: N/A;   ESOPHAGOGASTRODUODENOSCOPY (EGD) WITH PROPOFOL Left 11/14/2020   Procedure: ESOPHAGOGASTRODUODENOSCOPY (EGD) WITH PROPOFOL;  Surgeon: Yetta Flock, MD;  Location: [place];  Service: Gastroenterology;  Laterality: Left;   ESOPHAGOGASTRODUODENOSCOPY (EGD) WITH PROPOFOL N/A 02/22/2021   Procedure: ESOPHAGOGASTRODUODENOSCOPY (EGD) WITH PROPOFOL;  Surgeon: Rush Landmark Telford Nab., MD;  Location: [place];  Service: Gastroenterology;  Laterality: N/A;   FISTULA SUPERFICIALIZATION Left 01/21/2020   Procedure: FISTULA SUPERFICIALIZATION OF FISTULA;  Surgeon: [name], MD;  Location: [place];  Service: Vascular;  Laterality: Left;   FISTULA SUPERFICIALIZATION Left 01/01/8340   Procedure:  PLICATION OF LEFT UPPER EXTREMITY ARTERIOVENOUS FISTULA;  Surgeon: Waynetta [name], MD;  Location: [place];  Service: Vascular;  Laterality: Left;   GIVENS CAPSULE STUDY N/A 11/16/2020   Procedure: GIVENS CAPSULE STUDY;  Surgeon: Yetta Flock, MD;  Location: [place];  Service: Gastroenterology;  Laterality: N/A;   HEMOSTASIS CLIP PLACEMENT  02/22/2021   Procedure: HEMOSTASIS CLIP PLACEMENT;  Surgeon: Rush Landmark Telford Nab., MD;  Location: [place];  Service: Gastroenterology;;   INSERTION OF DIALYSIS CATHETER Right 11/04/2019   Procedure: CONVERT TEMPORARY DIALYSIS CATHETER TO TUNNELED DIALYSIS CATHETER Right Internal Jugular.;  Surgeon: [name], MD;  Location: [place];  Service: Vascular;  Laterality: Right;   IR BONE TUMOR(S)RF ABLATION  07/23/2021   IR KYPHO LUMBAR INC FX REDUCE BONE BX UNI/BIL CANNULATION INC/IMAGING  07/23/2021   IR THORACENTESIS ASP PLEURAL SPACE W/IMG GUIDE  08/17/2020   IR THORACENTESIS ASP PLEURAL SPACE W/IMG GUIDE  07/20/2021   MULTIPLE TOOTH EXTRACTIONS     POLYPECTOMY  11/16/2020   Procedure: POLYPECTOMY;  Surgeon: Yetta Flock, MD;  Location: [place];  Service: Gastroenterology;;   RADIOLOGY WITH ANESTHESIA N/A 06/19/2021   Procedure: MRI WITH ANESTHESIA  ,LIVER WITH AND WITHOUT CONTRAST;  Surgeon: Radiologist, Medication, MD;  Location: [place];  Service: Radiology;  Laterality: N/A;   RADIOLOGY WITH ANESTHESIA N/A 07/20/2021   Procedure: MRI WITH ANESTHESIA LOWER SPINE WITHOUT CONTRAST;  Surgeon: Radiologist, Medication, MD;  Location: [place];  Service: Radiology;  Laterality: N/A;   REVISION OF ARTERIOVENOUS GORETEX GRAFT Left 01/21/2020   Procedure: REVISION OF ARTERIOVENOUS FISTULA WITH SIDE BRANCH LIGATION;  Surgeon: [name], MD;  Location: [place];  Service: Vascular;  Laterality: Left;   SUBMUCOSAL LIFTING INJECTION  02/22/2021   Procedure: SUBMUCOSAL LIFTING INJECTION;  Surgeon: Irving Copas., MD;   Location: [place];  Service: Gastroenterology;;   SUBMUCOSAL TATTOO INJECTION  02/22/2021   Procedure: SUBMUCOSAL TATTOO INJECTION;  Surgeon: Irving Copas., MD;  Location: [place];  Service: Gastroenterology;;   TEE WITHOUT CARDIOVERSION N/A 10/26/2019   Procedure: TRANSESOPHAGEAL ECHOCARDIOGRAM (TEE);  Surgeon: [name], MD;  Location: [facility] ENDOSCOPY;  Service: Cardiovascular;  Laterality: N/A;   THORACENTESIS     x 2   THORACENTESIS N/A 11/07/2020   Procedure: Mathews Robinsons;  Surgeon: [name], MD;  Location: [facility] ENDOSCOPY;  Service: Pulmonary;  Laterality: N/A;   THORACENTESIS N/A 04/06/2021   Procedure: THORACENTESIS;  Surgeon: Freddi Starr, MD;  Location: [facility] ENDOSCOPY;  Service: Pulmonary;  Laterality: N/A;   UPPER ESOPHAGEAL ENDOSCOPIC ULTRASOUND (EUS) N/A 02/22/2021   Procedure: UPPER ESOPHAGEAL ENDOSCOPIC ULTRASOUND (EUS);  Surgeon: Irving Copas., MD;  Location: [place];  Service: Gastroenterology;  Laterality: N/A;     Home Medications:  Prior to Admission medications   Medication Sig Start Date End Date Taking? Authorizing Provider  acetaminophen (TYLENOL) 325 MG tablet Take 2 tablets (650 mg total) by mouth every 4 (four) hours as needed for headache or mild pain. 11/05/19   Kinnie Feil, MD  albuterol (VENTOLIN HFA) 108 (90 Base) MCG/ACT inhaler Inhale 2 puffs into the lungs every 6 (six) hours as needed for wheezing or shortness of breath. 11/30/19   [name], NP  allopurinol (ZYLOPRIM) 300 MG tablet Take 1 tablet (300 mg total) by mouth at bedtime. 07/20/21   [name], MD  apixaban (ELIQUIS) 5 MG TABS tablet Take 1 tablet (5 mg total) by mouth 2 (two) times daily. 07/20/21   [name], MD  atorvastatin (LIPITOR) 80 MG tablet Take 1 tablet (80 mg total) by mouth at bedtime. 07/20/21   [name], MD  cetirizine (ZYRTEC) 10 MG tablet Take 10 mg by mouth daily as needed for allergies.    [provider]  cinacalcet (SENSIPAR) 30 MG tablet Take 30 mg by mouth daily with supper.  04/18/20   [provider]  ferric citrate (AURYXIA) 1 GM 210 MG(Fe) tablet Take 420 mg by mouth 3 (three) times daily with meals.    [provider]  ferrous sulfate 324 MG TBEC Take 324 mg by mouth daily.    [provider]  furosemide (LASIX) 40 MG tablet Take 21m twice daily on non dialysis days. 08/20/21   MLarey Dresser MD  gabapentin (NEURONTIN) 100 MG capsule TAKE 1 CAPSULE BY MOUTH THREE TIMES DAILY 08/29/21   FGildardo Pounds NP  lidocaine-prilocaine (EMLA) cream Apply 1 application topically See admin instructions. Apply topically to port access one hour prior to dialysis - Monday, Wednesday, Friday, Saturday 04/07/20   [provider]  metoprolol tartrate (LOPRESSOR) 25 MG tablet Take 0.5 tablets (12.5 mg total) by mouth 2 (two) times daily. 08/21/21  [name], MD  multivitamin (RENA-VIT) TABS tablet Take 1 tablet by mouth at bedtime. 07/31/21   Cristal Deer, MD  Nutritional Supplements (,FEEDING SUPPLEMENT, PROSOURCE PLUS) liquid Take 30 mLs by mouth 2 (two) times daily between meals. 11/19/20   Pahwani, Michell Heinrich, MD  polyethylene glycol (MIRALAX / GLYCOLAX) 17 g packet Take 17 g by mouth daily as needed for moderate constipation. 07/31/21   Cristal Deer, MD  triamcinolone 0.1% oint-Eucerin equivalent cream 1:1 mixture Apply topically 3 (three) times daily as needed. APPLY TO DRY AFFECTED AREAS AS NEEDED 3 TIMES ADAY Patient taking differently: Apply 1 application topically 3 (three) times daily as needed for irritation, itching or rash. 04/24/21   [name], NP  vitamin B-12 (CYANOCOBALAMIN) 1000 MCG tablet Take 1,000 mcg by mouth daily.    [provider]    Inpatient Medications: Scheduled Meds:  (feeding supplement) PROSource Plus  30 mL Oral BID BM   allopurinol  300 mg Oral QHS   apixaban  5 mg Oral BID   atorvastatin  80  mg Oral QHS   cinacalcet  30 mg Oral Q supper   ferric citrate  420 mg Oral TID WC   [START ON 09/06/2021] ferrous sulfate  324 mg Oral Daily   gabapentin  100 mg Oral TID   multivitamin  1 tablet Oral QHS   sodium chloride flush  3 mL Intravenous Q12H   Continuous Infusions:  amiodarone 60 mg/hr (09/05/21 1546)   Followed by   amiodarone     PRN Meds: acetaminophen **OR** acetaminophen, albuterol, calcium carbonate (dosed in mg elemental calcium), camphor-menthol **AND** hydrOXYzine, docusate sodium, feeding supplement (NEPRO CARB STEADY), fentaNYL (SUBLIMAZE) injection, hydrALAZINE, ondansetron **OR** ondansetron (ZOFRAN) IV, polyethylene glycol, sorbitol, zolpidem  Allergies:    Allergies  Allergen Reactions   Black Walnut Flavor Hives   Shellfish Allergy Itching    Makes throat itch     Social History:   Social History   Socioeconomic History   Marital status: Widowed    Spouse name: Not on file   Number of children: Not on file   Years of education: Not on file   Highest education level: Not on file  Occupational History   Not on file  Tobacco Use   Smoking status: Former    Types: Cigarettes   Smokeless tobacco: Never  Vaping Use   Vaping Use: Never used  Substance and Sexual Activity   Alcohol use: Not Currently   Drug use: Not Currently   Sexual activity: Not Currently  Other Topics Concern   Not on file  Social History Narrative   Not on file   Social Determinants of Health   Financial Resource Strain: Low Risk    Difficulty of Paying Living Expenses: Not very hard  Food Insecurity: No Food Insecurity   Worried About Running Out of Food in the Last Year: Never true   Ran Out of Food in the Last Year: Never true  Transportation Needs: No Transportation Needs   Lack of Transportation (Medical): No   Lack of Transportation (Non-Medical): No  Physical Activity: Not on file  Stress: Not on file  Social Connections: Socially Isolated   Frequency of  Communication with Friends and Family: Three times a week   Frequency of Social Gatherings with Friends and Family: Three times a week   Attends Religious Services: Never   Active Member of Clubs or Organizations: No   Attends Archivist Meetings: Never   Marital  Status: Widowed  Human resources officer Violence: Not on file    Family History:    Family History  Problem Relation Age of Onset   Seizures Father    CAD Father    Diabetes Sister    Lupus Sister    Hypertension Sister    Colon cancer Neg Hx    Esophageal cancer Neg Hx    Pancreatic cancer Neg Hx    Stomach cancer Neg Hx    Inflammatory bowel disease Neg Hx    Liver disease Neg Hx    Rectal cancer Neg Hx      ROS:  Please see the history of present illness.   All other ROS reviewed and negative.     Physical Exam/Data:   Vitals:   09/05/21 1300 09/05/21 1330 09/05/21 1415 09/05/21 1542  BP: 95/73 (!) 92/57 98/78 (!) 98/55  Pulse: 90 94 (!) 112 74  Resp: _0 Temp:      TempSrc:      SpO2: 100% 98% 100% 95%  Height:        Intake/Output Summary (Last 24 hours) at 09/05/2021 1649 Last data filed at 09/05/2021 1138 Gross per 24 hour  Intake 1024.85 ml  Output --  Net 1024.85 ml   Last 3 Weights 08/16/2021 08/09/2021 07/30/2021  Weight (lbs) (No Data) (No Data) 205 lb 11 oz  Weight (kg) (No Data) (No Data) 93.3 kg     Body mass index is 36.44 kg/m.  General:  Well nourished, well developed, in no acute distress HEENT: normal Neck: + JVD Vascular: No carotid bruits; Distal pulses 2+ bilaterally Cardiac:  irregular rhythm, tachycardic rate, + 2/6 murmur Lungs:  respirations unlabored, diminished in right base, scattered crackles B Abd: soft, nontender, no hepatomegaly  Ext: 1-2+ B LE edema Musculoskeletal:  No deformities, BUE and BLE strength normal and equal Skin: warm and dry  Neuro:  CNs 2-12 intact, no focal abnormalities noted Psych:  Normal affect   EKG:  The EKG was personally  reviewed and demonstrates:  atrial fibrillation with ventricular rate 133, right axis deviation,  Telemetry:  Telemetry was personally reviewed and demonstrates:  atrial fibrillation with ventricular rates 130s --> 100s  Relevant CV Studies:  Echo 11/17/20: 1. Left ventricular ejection fraction, by estimation, is 35 to 40%. The  left ventricle has moderately decreased function. The left ventricle  demonstrates global hypokinesis. There is mild concentric left ventricular  hypertrophy. Left ventricular  diastolic parameters are indeterminate.   2. Right ventricular systolic function is normal. The right ventricular  size is normal. There is moderately elevated pulmonary artery systolic  pressure.   3. The mitral valve is normal in structure. Mild mitral valve  regurgitation. No evidence of mitral stenosis.   4. Tricuspid valve regurgitation is moderate to severe.   5. There is restriction of the right and noncoronary cusps. Visually  there appears to be at least moderate aortic stenosis. LVOT VTI was not  recorded, so cannot calculate a dimensionless index. Cannot exclude  low-flow, low gradient aortic stenosis. The  aortic valve is calcified. There is moderate calcification of the aortic  valve. There is moderate thickening of the aortic valve. Aortic valve  regurgitation is not visualized. Mild to moderate aortic valve  sclerosis/calcification is present, without any  evidence of aortic stenosis. Aortic valve mean gradient measures 9.0 mmHg.  Aortic valve Vmax measures 2.13 m/s.   6. The inferior vena cava is dilated in size with <50% respiratory  variability, suggesting right atrial pressure of 15 mmHg.   Laboratory Data:  High Sensitivity Troponin:   Recent Labs  Lab 09/05/21 0721 09/05/21 0920  TROPONINIHS 85* 79*     Chemistry Recent Labs  Lab 09/05/21 0721  NA 132*  K 3.6  CL 92*  CO2 25  GLUCOSE 84  BUN 39*  CREATININE 5.09*  CALCIUM 8.8*  GFRNONAA 8*   ANIONGAP 15    Recent Labs  Lab 09/05/21 0721  PROT 6.1*  ALBUMIN 2.9*  AST 144*  ALT 49*  ALKPHOS 485*  BILITOT 2.4*   Lipids No results for input(s): CHOL, TRIG, HDL, LABVLDL, LDLCALC, CHOLHDL in the last 168 hours.  Hematology Recent Labs  Lab 09/05/21 0721  WBC 6.9  RBC 3.15*  HGB 9.9*  HCT 32.3*  MCV 102.5*  MCH 31.4  MCHC 30.7  RDW 19.5*  PLT 120*   Thyroid No results for input(s): TSH, FREET4 in the last 168 hours.  BNPNo results for input(s): BNP, PROBNP in the last 168 hours.  DDimer No results for input(s): DDIMER in the last 168 hours.   Radiology/Studies:  DG Chest 2 View  Result Date: 09/05/2021 CLINICAL DATA:  Chest and back pain with shortness of breath. EXAM: CHEST - 2 VIEW COMPARISON:  Chest x-ray 07/20/2021 FINDINGS: The heart is enlarged but appears stable. Stable tortuosity and calcification of the thoracic aorta. New moderate-sized right-sided pleural effusion with overlying atelectasis. The left lung remains relatively clear. IMPRESSION: New moderate-sized right pleural effusion with overlying atelectasis. Stable cardiac enlargement. Electronically Signed   By: Marijo Sanes M.D.   On: 09/05/2021 07:51   CT Angio Chest PE W and/or Wo Contrast  Result Date: 09/05/2021 CLINICAL DATA:  PE suspected. High probability. History of carcinoid tumor. EXAM: CT ANGIOGRAPHY CHEST WITH CONTRAST TECHNIQUE: Multidetector CT imaging of the chest was performed using the standard protocol during bolus administration of intravenous contrast. Multiplanar CT image reconstructions and MIPs were obtained to evaluate the vascular anatomy. CONTRAST:  11m OMNIPAQUE IOHEXOL 350 MG/ML SOLN COMPARISON:  Chest radiograph concurrent.  CT chest, 07/06/2021. FINDINGS: Cardiovascular: Satisfactory opacification of the pulmonary arteries to the segmental level. No evidence of pulmonary embolism. Cardiomegaly, with RIGHT heart enlargement. No pericardial effusion. Mediastinum/Nodes: No  enlarged mediastinal, hilar, or axillary lymph nodes. Thyroid gland, trachea, and esophagus demonstrate no significant findings. Lungs/Pleura: Increased number and size of multiple pulmonary nodules, since recent 08/22 comparison. Persistent small-to-moderate volume of RIGHT pleural effusion, with adjacent and dependent atelectasis. No pneumothorax. Upper Abdomen: No acute abnormality within the imaged portion. Musculoskeletal: Coarse RIGHT breast calcification is unchanged. The RIGHT axilla adenopathy, with biopsy clip at the dominant node. Lytic osseous lesion at the RIGHT lateral fourth rib, with nondisplaced pathologic fracture. Additional chronic bilateral rib deformities. Degenerative changes of the imaged thoracic spine, including multilevel anterior osteophytes. Review of the MIP images confirms the above findings. IMPRESSION: 1. No segmental or larger pulmonary embolus. 2. Persistent moderate volume RIGHT pleural effusion with atelectasis. 3. Increased number and size of multifocal pulmonary nodules since August 22, with findings favored metastatic. 4. RIGHT lateral fourth rib lytic lesion with nondisplaced pathologic fracture. 5. Cardiomegaly with RIGHT heart enlargement. Additional chronic and senescent changes, as above. Electronically Signed   By: JMichaelle BirksM.D.   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 CONTRAST:  60m OMNIPAQUE IOHEXOL 350 MG/ML SOLN COMPARISON:  07/06/2021 FINDINGS: Lower chest: Moderate to large right pleural effusion associated atelectasis or consolidation. Cardiomegaly. Hepatobiliary: Numerous liver lesions of varying sizes, increased in size and number compared  to prior examination, largest almost entirely replacing the left lobe measuring 8.4 x 6.5 cm, previously 3.5 x 3.5 cm when measured similarly (series 6, image 20). Additional index lesion of the posterior right lobe of the liver measures 2.9 x 2.7 cm, previously 1.7 x 1.7 cm (series 6, image 26). Tiny gallstones and or sludge in the dependent gallbladder. No gallbladder wall thickening. No biliary dilatation. Pancreas: Unremarkable. No pancreatic ductal dilatation or surrounding inflammatory changes. Spleen: Normal in size without significant abnormality. Adrenals/Urinary Tract: Benign, fatty attenuation left adrenal nodule (series 5, image 7). Kidneys are normal, without renal calculi, solid lesion, or hydronephrosis. Bladder is unremarkable. Stomach/Bowel: Stomach is within normal limits. Appendix appears normal. No evidence of bowel wall thickening, distention, or inflammatory changes. Vascular/Lymphatic: Aortic atherosclerosis. No enlarged abdominal or pelvic lymph nodes. Reproductive: Status post hysterectomy. Other: Anasarca. Small volume ascites throughout the abdomen and pelvis, increased compared to prior examination. Some evidence of peritoneal thickening in the pelvis (series 6, image 71). Musculoskeletal: Diffusely lytic osseous metastatic disease throughout, status post vertebral cement augmentation of L4. IMPRESSION: 1. Numerous liver lesions of varying sizes, increased in size and number compared to prior examination, consistent with worsened hepatic metastatic disease. 2. Small volume ascites throughout the abdomen and pelvis, increased compared to prior examination. Some evidence of peritoneal thickening in the pelvis. 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 3. Diffusely lytic osseous metastatic disease throughout, status post vertebral cement augmentation of L4. 4. Moderate to large right pleural effusion associated atelectasis or consolidation,  incompletely imaged. 5. Cardiomegaly. Aortic Atherosclerosis (ICD10-I70.0). Electronically Signed   By: ADelanna AhmadiM.D.   On: 09/05/2021 10:37   VAS [place]OWER EXTREMITY VENOUS (DVT) (7a-7p)  Result Date: 09/05/2021  Lower Venous DVT Study [facility]  [facility]:   09/05/2021 Medical Rec #: [id number]         Accession #:    [id number] of Birth: 109-26-1947        Patient Gender: F Patient Age:   89years [facility]      VAS [place]OWER EXTREMITY VENOUS (DVT) Referring Phys: JULIE HAVILAND --------------------------------------------------------------------------------  Indications: Edema.  Comparison Study: 10-13-2019 Prior bilateral lower extremity venous was negative                   for DVT. Performing Technologist: RDarlin CocoRDMS, RVT  Examination Guidelines: A complete evaluation includes B-mode imaging, spectral Doppler, color Doppler, and power Doppler as needed of all accessible portions of each vessel. Bilateral testing is considered an integral part of a complete examination. Limited examinations for reoccurring indications may be performed as noted. The reflux portion of the exam is performed with the patient in reverse Trendelenburg.  +-----+---------------+---------+-----------+----------+-----------------------+ RIGHTCompressibilityPhasicitySpontaneityPropertiesThrombus Aging          +-----+---------------+---------+-----------+----------+-----------------------+ CFV                                               Unable to access  due to                                                   patient positioning     +-----+---------------+---------+-----------+----------+-----------------------+   +---------+---------------+---------+-----------+----------+--------------+ LEFT     CompressibilityPhasicitySpontaneityPropertiesThrombus Aging  +---------+---------------+---------+-----------+----------+--------------+ CFV      Full           Yes      Yes                                 +---------+---------------+---------+-----------+----------+--------------+ SFJ      Full                                                        +---------+---------------+---------+-----------+----------+--------------+ FV Prox  Full                                                        +---------+---------------+---------+-----------+----------+--------------+ FV Mid   Full                                                        +---------+---------------+---------+-----------+----------+--------------+ FV DistalFull                                                        +---------+---------------+---------+-----------+----------+--------------+ PFV      Full                                                        +---------+---------------+---------+-----------+----------+--------------+ POP      Full           Yes      Yes                                 +---------+---------------+---------+-----------+----------+--------------+ PTV      Full                                                        +---------+---------------+---------+-----------+----------+--------------+ PERO     Full                                                        +---------+---------------+---------+-----------+----------+--------------+  Summary: LEFT: - There is no evidence of deep vein thrombosis in the lower extremity.  - No cystic structure found in the popliteal fossa.  *See table(s) above for measurements and observations.    Preliminary      Assessment and Plan:   Atrial fibrillation with RVR Chronic anticoagulation - has received IV cardizem but became hypotensive - she has been started on amiodarone drip - LFTs elevated likely due to mets - has been continued on eliquis - I think her options for rate control  are limited - once BP more stable (improved from 85T to 09P systolic), may be able to add back PO metoprolol to help with rate control - would avoid further cardizem given her heart failure and hypotension   Chest pain - started last evening - hs troponin 85 --> 79 - echo pending - suspect CP is MSK in origin - exquisitely tender to palpation over her anterior chest - nonobstructive disease by heart cath in 2020 - no ischemic workup   NICM Chronic systolic heart failure Moderate to severe TR, mild MR - LVEF has fluctuated from 20% to 40% - most recent evaluation in 10/2020 with LVEF 35-40% - volume managed with HD   Hypertension - metoprolol on hold for rate controlling medications   Elevated LFTs - AST 98 --> 144 - ALT 35 --> 49 - alk phos 415 --> 485 - numerous liver lesions of varying sizes increased in size and number from prior imaging   Recurrent right pleural effusion Acute on chronic respiratory failure - she has had several thoracenteses - wears O2 at home qhs - was hypoxic to 87% - consider placement of pleurx  - CTA negative for PE, but increased number and size of multifocal pulmonary nodules since Aug 2022   Metastatic breast cancer - receiving palliative radiation to her back - per oncology    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  New York Heart Association (NYHA) Functional Class NYHA Class II  CHA2DS2-VASc Score = 4 This indicates a 4.8% annual risk of stroke. The patient's score is based upon: CHF History: 1 HTN History: 1 Diabetes History: 0 Stroke History: 0 Vascular Disease History: 0 Age Score: 1 Gender Score: 1        For questions or updates, please contact [place] Please consult [email or web address] for contact info under    Signed, Ledora Bottcher, PA  09/05/2021 4:49 PM  I have personally seen and examined this patient. I agree with the assessment and plan as outlined above.  Melissa Adams has advanced metastatic cancer. She is ESRD on HD. Now presenting with atypical chest pain that is reproducible on exam. Troponin elevation c/w her ESRD. Not likely ACS. No ischemic workup is indicated.   She has known atrial fib with elevated rates. She has been treated with IV cardizem in the ED with hypotension. Now on an amiodarone drip. OK to continue amiodarone tonight but given liver mets and abnormal LFTs, not a good long term option. Hopefully she can tolerate a beta blocker.   Volume management with HD.   Overall very poor prognosis with advanced cancer.  Lauree Chandler, MD 09/05/2021 5:02 PM

## 2021-09-06 DIAGNOSIS — Z992 Dependence on renal dialysis: Secondary | ICD-10-CM

## 2021-09-06 DIAGNOSIS — C50919 Malignant neoplasm of unspecified site of unspecified female breast: Secondary | ICD-10-CM

## 2021-09-06 DIAGNOSIS — R079 Chest pain, unspecified: Secondary | ICD-10-CM

## 2021-09-06 DIAGNOSIS — Z66 Do not resuscitate: Secondary | ICD-10-CM

## 2021-09-06 DIAGNOSIS — C799 Secondary malignant neoplasm of unspecified site: Secondary | ICD-10-CM

## 2021-09-06 DIAGNOSIS — J90 Pleural effusion, not elsewhere classified: Secondary | ICD-10-CM

## 2021-09-06 DIAGNOSIS — Z515 Encounter for palliative care: Secondary | ICD-10-CM

## 2021-09-06 DIAGNOSIS — D3A8 Other benign neuroendocrine tumors: Secondary | ICD-10-CM

## 2021-09-06 DIAGNOSIS — N186 End stage renal disease: Secondary | ICD-10-CM

## 2021-09-06 DIAGNOSIS — Z7189 Other specified counseling: Secondary | ICD-10-CM

## 2021-09-06 DIAGNOSIS — K5900 Constipation, unspecified: Secondary | ICD-10-CM

## 2021-09-06 LAB — HEPATITIS B SURFACE ANTIBODY,QUALITATIVE: Hep B S Ab: REACTIVE — AB

## 2021-09-06 LAB — BASIC METABOLIC PANEL
Anion gap: 13 (ref 5–15)
BUN: 54 mg/dL — ABNORMAL HIGH (ref 8–23)
CO2: 25 mmol/L (ref 22–32)
Calcium: 8.4 mg/dL — ABNORMAL LOW (ref 8.9–10.3)
Chloride: 93 mmol/L — ABNORMAL LOW (ref 98–111)
Creatinine, Ser: 5.54 mg/dL — ABNORMAL HIGH (ref 0.44–1.00)
GFR, Estimated: 8 mL/min — ABNORMAL LOW (ref >60)
Glucose, Bld: 96 mg/dL (ref 70–99)
Potassium: 3.7 mmol/L (ref 3.5–5.1)
Sodium: 131 mmol/L — ABNORMAL LOW (ref 135–145)

## 2021-09-06 LAB — CBC
HCT: 28.3 % — ABNORMAL LOW (ref 36.0–46.0)
Hemoglobin: 9 g/dL — ABNORMAL LOW (ref 12.0–15.0)
MCH: 31.6 pg (ref 26.0–34.0)
MCHC: 31.8 g/dL (ref 30.0–36.0)
MCV: 99.3 fL (ref 80.0–100.0)
Platelets: 124 10*3/uL — ABNORMAL LOW (ref 150–400)
RBC: 2.85 MIL/uL — ABNORMAL LOW (ref 3.87–5.11)
RDW: 19 % — ABNORMAL HIGH (ref 11.5–15.5)
WBC: 8.2 10*3/uL (ref 4.0–10.5)
nRBC: 0 % (ref 0.0–0.2)

## 2021-09-06 LAB — HEPATITIS B SURFACE ANTIGEN: Hepatitis B Surface Ag: NONREACTIVE

## 2021-09-06 MED ORDER — POLYETHYLENE GLYCOL 3350 17 G PO PACK
17.0000 g | PACK | Freq: Every day | ORAL | Status: DC
  Administered 2021-09-06: 17 g via ORAL
  Filled 2021-09-06: qty 1

## 2021-09-06 MED ORDER — SENNOSIDES-DOCUSATE SODIUM 8.6-50 MG PO TABS: 1.0000 | ORAL_TABLET | Freq: Two times a day (BID) | ORAL | Status: DC | PRN

## 2021-09-06 NOTE — Progress Notes (Signed)
Progress Note  Patient Name: Melissa Adams Date of Encounter: 09/06/2021  [facility] Cardiologist: [name], MD   Subjective   C/o severe R chest pain w/ tenderness on R chest, also tender abdomen  Inpatient Medications    Scheduled Meds:  (feeding supplement) PROSource Plus  30 mL Oral BID BM   allopurinol  300 mg Oral QHS   apixaban  5 mg Oral BID   atorvastatin  80 mg Oral QHS   cinacalcet  30 mg Oral Q supper   ferric citrate  420 mg Oral TID WC   ferrous sulfate  324 mg Oral Daily   gabapentin  100 mg Oral TID   multivitamin  1 tablet Oral QHS   sodium chloride flush  3 mL Intravenous Q12H   Continuous Infusions:  sodium chloride     sodium chloride     amiodarone 30 mg/hr (09/06/21 0454)   PRN Meds: sodium chloride, sodium chloride, acetaminophen **OR** acetaminophen, albuterol, alteplase, calcium carbonate (dosed in mg elemental calcium), camphor-menthol **AND** hydrOXYzine, docusate sodium, feeding supplement (NEPRO CARB STEADY), fentaNYL (SUBLIMAZE) injection, heparin, hydrALAZINE, lidocaine (PF), lidocaine-prilocaine, ondansetron **OR** ondansetron (ZOFRAN) IV, pentafluoroprop-tetrafluoroeth, polyethylene glycol, sorbitol, zolpidem   Vital Signs    Vitals:   09/05/21 1654 09/05/21 2000 09/06/21 0000 09/06/21 0420  BP: 93/67 108/69 99/70 96/65   Pulse: 94 94 (!) 120 (!) 107  Resp: 18 17 20 16   Temp: 98.3 F (36.8 C) 99 F (37.2 C) 98.6 F (37 C) 98.8 F (37.1 C)  TempSrc: Oral Oral Oral Oral  SpO2:  100% 99% 99%  Weight: 90.5 kg   90.3 kg  Height: 5\' 3"  (1.6 m)       Intake/Output Summary (Last 24 hours) at 09/06/2021 0754 Last data filed at 09/06/2021 0454 Gross per 24 hour  Intake 1457.55 ml  Output --  Net 1457.55 ml   Last 3 Weights 09/06/2021 09/05/2021 08/16/2021  Weight (lbs) 199 lb 1.2 oz 199 lb 8.3 oz (No Data)  Weight (kg) 90.3 kg 90.5 kg (No Data)      Telemetry    Atrial fib, complexes on tele w/ very low amplitude  so machine thinks brady but she is not. PVCs are frequent at times - Personally Reviewed  ECG    10/13 ECG is atrial fib, RVR, HR 106, no acute ischemic changes - Personally Reviewed  Physical Exam   GEN: acute distress 2nd R chest pain Neck: No JVD assessment since pt lying flat and cannot sit up Cardiac: Irreg R&R, soft murmur, no rubs, or gallops.  Respiratory: decreased BS R>L, painful for her to take deep breath GI: Soft, ++tender, non-distended  MS: No edema; No deformity. Neuro:  Nonfocal  Psych: Normal affect but is very uncomfortable  Labs    High Sensitivity Troponin:   Recent Labs  Lab 09/05/21 0721 09/05/21 0920  TROPONINIHS 85* 79*     Chemistry Recent Labs  Lab 09/05/21 0721 09/06/21 0113  NA 132* 131*  K 3.6 3.7  CL 92* 93*  CO2 25 25  GLUCOSE 84 96  BUN 39* 54*  CREATININE 5.09* 5.54*  CALCIUM 8.8* 8.4*  PROT 6.1*  --   ALBUMIN 2.9*  --   AST 144*  --   ALT 49*  --   ALKPHOS 485*  --   BILITOT 2.4*  --   GFRNONAA 8* 8*  ANIONGAP 15 13    Lipids No results for input(s): CHOL, TRIG, HDL, LABVLDL, LDLCALC, CHOLHDL in the  last 168 hours.  Hematology Recent Labs  Lab 09/05/21 0721 09/06/21 0113  WBC 6.9 8.2  RBC 3.15* 2.85*  HGB 9.9* 9.0*  HCT 32.3* 28.3*  MCV 102.5* 99.3  MCH 31.4 31.6  MCHC 30.7 31.8  RDW 19.5* 19.0*  PLT 120* 124*   Thyroid No results for input(s): TSH, FREET4 in the last 168 hours.  BNPNo results for input(s): BNP, PROBNP in the last 168 hours.  DDimer No results for input(s): DDIMER in the last 168 hours.   Radiology    DG Chest 2 View  Result Date: 09/05/2021 CLINICAL DATA:  Chest and back pain with shortness of breath. EXAM: CHEST - 2 VIEW COMPARISON:  Chest x-ray 07/20/2021 FINDINGS: The heart is enlarged but appears stable. Stable tortuosity and calcification of the thoracic aorta. New moderate-sized right-sided pleural effusion with overlying atelectasis. The left lung remains relatively clear. IMPRESSION:  New moderate-sized right pleural effusion with overlying atelectasis. Stable cardiac enlargement. Electronically Signed   By: Marijo Sanes M.D.   On: 09/05/2021 07:51   CT Angio Chest PE W and/or Wo Contrast  Result Date: 09/05/2021 CLINICAL DATA:  PE suspected. High probability. History of carcinoid tumor. EXAM: CT ANGIOGRAPHY CHEST WITH CONTRAST TECHNIQUE: Multidetector CT imaging of the chest was performed using the standard protocol during bolus administration of intravenous contrast. Multiplanar CT image reconstructions and MIPs were obtained to evaluate the vascular anatomy. CONTRAST:  71mL OMNIPAQUE IOHEXOL 350 MG/ML SOLN COMPARISON:  Chest radiograph concurrent.  CT chest, 07/06/2021. FINDINGS: Cardiovascular: Satisfactory opacification of the pulmonary arteries to the segmental level. No evidence of pulmonary embolism. Cardiomegaly, with RIGHT heart enlargement. No pericardial effusion. Mediastinum/Nodes: No enlarged mediastinal, hilar, or axillary lymph nodes. Thyroid gland, trachea, and esophagus demonstrate no significant findings. Lungs/Pleura: Increased number and size of multiple pulmonary nodules, since recent 08/22 comparison. Persistent small-to-moderate volume of RIGHT pleural effusion, with adjacent and dependent atelectasis. No pneumothorax. Upper Abdomen: No acute abnormality within the imaged portion. Musculoskeletal: Coarse RIGHT breast calcification is unchanged. The RIGHT axilla adenopathy, with biopsy clip at the dominant node. Lytic osseous lesion at the RIGHT lateral fourth rib, with nondisplaced pathologic fracture. Additional chronic bilateral rib deformities. Degenerative changes of the imaged thoracic spine, including multilevel anterior osteophytes. Review of the MIP images confirms the above findings. IMPRESSION: 1. No segmental or larger pulmonary embolus. 2. Persistent moderate volume RIGHT pleural effusion with atelectasis. 3. Increased number and size of multifocal  pulmonary nodules since August 22, with findings favored metastatic. 4. RIGHT lateral fourth rib lytic lesion with nondisplaced pathologic fracture. 5. Cardiomegaly with RIGHT heart enlargement. Additional chronic and senescent changes, as above. Electronically Signed   By: Michaelle Birks M.D.   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 CONTRAST:  69mL OMNIPAQUE IOHEXOL 350 MG/ML SOLN COMPARISON:  07/06/2021 FINDINGS: Lower chest: Moderate to large right pleural effusion associated atelectasis or consolidation. Cardiomegaly. Hepatobiliary: Numerous liver lesions of varying sizes, increased in size and number compared to prior examination, largest almost entirely replacing the left lobe measuring 8.4 x 6.5 cm, previously 3.5 x 3.5 cm when measured similarly (series 6, image 20). Additional index lesion of the posterior right lobe of the liver measures 2.9 x 2.7 cm, previously 1.7 x  1.7 cm (series 6, image 26). Tiny gallstones and or sludge in the dependent gallbladder. No gallbladder wall thickening. No biliary dilatation. Pancreas: Unremarkable. No pancreatic ductal dilatation or surrounding inflammatory changes. Spleen: Normal in size without significant abnormality. Adrenals/Urinary Tract: Benign, fatty attenuation left adrenal nodule (series 5, image 7). Kidneys are normal, without renal calculi, solid lesion, or hydronephrosis. Bladder is unremarkable. Stomach/Bowel: Stomach is within normal limits. Appendix appears normal. No evidence of bowel wall thickening, distention, or inflammatory changes. Vascular/Lymphatic: Aortic atherosclerosis. No enlarged abdominal or pelvic lymph nodes. Reproductive:  Status post hysterectomy. Other: Anasarca. Small volume ascites throughout the abdomen and pelvis, increased compared to prior examination. Some evidence of peritoneal thickening in the pelvis (series 6, image 71). Musculoskeletal: Diffusely lytic osseous metastatic disease throughout, status post vertebral cement augmentation of L4. IMPRESSION: 1. Numerous liver lesions of varying sizes, increased in size and number compared to prior examination, consistent with worsened hepatic metastatic disease. 2. Small volume ascites throughout the abdomen and pelvis, increased compared to prior examination. Some evidence of peritoneal thickening in the pelvis. 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 3. Diffusely lytic osseous metastatic disease throughout, status post vertebral cement augmentation of L4. 4. Moderate to large right pleural effusion associated atelectasis or consolidation, incompletely imaged. 5. Cardiomegaly. Aortic Atherosclerosis (ICD10-I70.0). Electronically Signed   By: Delanna Ahmadi M.D.   On: 09/05/2021 10:37   VAS [place] LOWER EXTREMITY VENOUS (DVT) (7a-7p)  Result Date: 09/05/2021  Lower Venous DVT Study Patient Name:  LAURAJEAN HOSEK [facility]  Date of Exam:   09/05/2021 Medical Rec #: [id number]          Accession #:    [id number] Date of Birth: 1946/02/10         Patient Gender: F Patient Age:   68 years Exam Location:  [facility] Procedure:      VAS [place] LOWER EXTREMITY VENOUS (DVT) Referring Phys: JULIE HAVILAND --------------------------------------------------------------------------------  Indications: Edema.  Comparison Study: 10-13-2019 Prior bilateral lower extremity venous was negative                   for DVT. Performing Technologist: Darlin Coco RDMS, RVT  Examination Guidelines: A complete evaluation includes B-mode imaging, spectral Doppler, color Doppler, and power Doppler as needed of all accessible portions of each vessel. Bilateral  testing is considered an integral part of a complete examination. Limited examinations for reoccurring indications may be performed as noted. The reflux portion of the exam is performed with the patient in reverse Trendelenburg.  +-----+---------------+---------+-----------+----------+-----------------------+ RIGHTCompressibilityPhasicitySpontaneityPropertiesThrombus Aging          +-----+---------------+---------+-----------+----------+-----------------------+ CFV                                               Unable to access due to                                                   patient positioning     +-----+---------------+---------+-----------+----------+-----------------------+   +---------+---------------+---------+-----------+----------+--------------+ LEFT     CompressibilityPhasicitySpontaneityPropertiesThrombus Aging +---------+---------------+---------+-----------+----------+--------------+ CFV      Full           Yes      Yes                                 +---------+---------------+---------+-----------+----------+--------------+  SFJ      Full                                                        +---------+---------------+---------+-----------+----------+--------------+ FV Prox  Full                                                        +---------+---------------+---------+-----------+----------+--------------+ FV Mid   Full                                                        +---------+---------------+---------+-----------+----------+--------------+ FV DistalFull                                                        +---------+---------------+---------+-----------+----------+--------------+ PFV      Full                                                        +---------+---------------+---------+-----------+----------+--------------+ POP      Full           Yes      Yes                                  +---------+---------------+---------+-----------+----------+--------------+ PTV      Full                                                        +---------+---------------+---------+-----------+----------+--------------+ PERO     Full                                                        +---------+---------------+---------+-----------+----------+--------------+    Summary: LEFT: - There is no evidence of deep vein thrombosis in the lower extremity.  - No cystic structure found in the popliteal fossa.  *See table(s) above for measurements and observations. Electronically signed by Harold Barban MD on 09/05/2021 at 9:27:00 PM.    Final     Cardiac Studies   ECHO: 11/17/2020  1. Left ventricular ejection fraction, by estimation, is 35 to 40%. The  left ventricle has moderately decreased function. The left ventricle  demonstrates global hypokinesis. There is mild concentric left ventricular  hypertrophy. Left ventricular diastolic parameters are indeterminate.   2. Right ventricular systolic function is normal. The  right ventricular  size is normal. There is moderately elevated pulmonary artery systolic  pressure.   3. The mitral valve is normal in structure. Mild mitral valve  regurgitation. No evidence of mitral stenosis.   4. Tricuspid valve regurgitation is moderate to severe.   5. There is restriction of the right and noncoronary cusps. Visually  there appears to be at least moderate aortic stenosis. LVOT VTI was not  recorded, so cannot calculate a dimensionless index. Cannot exclude  low-flow, low gradient aortic stenosis. The  aortic valve is calcified. There is moderate calcification of the aortic  valve. There is moderate thickening of the aortic valve. Aortic valve  regurgitation is not visualized. Mild to moderate aortic valve sclerosis/calcification is present, without any evidence of aortic stenosis. Aortic valve mean gradient measures 9.0 mmHg.  Aortic valve Vmax  measures 2.13 m/s.   6. The inferior vena cava is dilated in size with <50% respiratory  variability, suggesting right atrial pressure of 15 mmHg.   Patient Profile     75 y.o. 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  Assessment & Plan    Chest pain - sx more MS in character, on R side where she has the pleural effusion - mild trop elevation more c/w demand ischemia from RVR and/or CHF - No ischemic eval indicated  2. Atrial fib, RVR - increased HR to 130s in the setting of pain, SOB - Cardizem IV dropped BP to 70s, d/c'd - on IV amio but SBP 80s, may need to d/c - treat underlying cause  3. Recurrent Pleural effusions - hx multiple thoracenteses - O2 sats 70s on admit, even on home O2 3 lpm  4. presumed metastatic poorly differentiated adenocarcinoma, most likely breast cancer - completed XRT to her back 10/11, but not helping - mammogram showed breast CA >> has appt for this   For questions or updates, please contact [facility] Please consult [email or web address] for contact info under        Signed, Rosaria Ferries, PA-C  09/06/2021, 7:54 AM

## 2021-09-06 NOTE — Progress Notes (Signed)
Patient is a 75 yo female with recurrent right pleural effusion, most likely due to ESRD and presumed metastatic poorly differentiated adenocarcinoma, most likely breast cancer.   IR was requested for image guided Right pleural PleurX catheter placement.   Case was reviewed and approved by Dr. Earleen Newport.  Discussed with patient's daughter Ms. [name] Franklin via telephone.  Mrs. Mateo Flow states she talked to patient's pulmonologist about the PleurX catheter placement in the past, and after thorough discussion, she decided not to proceed with the procedure as the risk/complication  of the procedure such as infection from having indwelling cathter is too high.  Ms. Mateo Flow thinks it is too early to consider the procedure as the patient only needs thoracentesis once in couple month.   Ms. Mateo Flow asked if her mother needs thoracentesis today, informed Ms. Mateo Flow that her chest xray showed right moderate pleural effusion, but the vital sings appear to be stable.   Informed Ms. Mateo Flow that will discuss with the attending provider to see if thoracentesis is needed.    Ordering provider notified.   Will delete the IR eval order.  Please call IR for questions and concerns.   Armando Gang Yaritsa Savarino PA-C 09/06/2021 9:49 AM

## 2021-09-06 NOTE — Plan of Care (Signed)

## 2021-09-06 NOTE — Progress Notes (Signed)
Patient complained of substernal pain 10/10 EKG done no changes noted.

## 2021-09-06 NOTE — Progress Notes (Signed)
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 fib, ESRD on HD MWF, HTN/HLD, chronic systolic CHF sent from dialysis center due to chest pain shortness of breath.   In the ED underwent work-up found to have A. fib with RVR placed on Cardizem, BP dropped to 70s switched to amiodarone.  CTA chest shows increased number and size of pulmonary nodules likely metastatic disease, mets to right lateral fourth rib, CT abdomen shows increasing number and size of liver lesions consistent with worsening liver metastatic disease, malignant ascites, increasing pleural effusion.  The patient was admitted and cardiology nephrology hematology and palliative consulted.  Subjective: Seen and examined this morning. Patient appears uncomfortable but was not in any distress. On nasal cannula 3 L.  Patient reports uses 3 to oxygen at home Overnight heart rate fairly stable on amiodarone drip, blood pressure 90s to 100 Labs with hyponatremia 131 anemia 9.2 thrombocytopenia 124 Troponin flat 85>79 Blood pressure soft in the 80s on amiodarone, repeat blood sugar was 100 on the leg  Assessment & Plan:  Atrial fibrillation with RVR: Rate controlled on amiodarone.  On Eliquis.  Cardiology on board-plan as per cardiology.  Poorly differentiated adenocarcinoma most consistent with breast cancer with metastasis : Patient has metastatic pulmonary nodules, liver lesion, suspect related ascites/malignant pleural effusion  On palliative therapy.  Hematology consulted palliative care consult -Daughter leaning towrds comfort-further discussion  Goals of care: Currently DNR prognosis overall appears poor/guarded  awaiting further input from hematology palliative medicine.  ESRD on dialysis MWF nephrology on board-holding dialysis today Anemia of chronic renal disease: Hemoglobin is stable Metabolic bone disease: Continue home binders Hypertension/volume: Low blood pressure on admission  Recurrent moderate right pleural effusion: Previous history of thoracentesis in August.  Does not appear overtly dyspneic or short of breath this morning.  Awaiting for further palliative care evaluation before deciding on thoracentesis/Pleurx.  IR was consulted.  Benign neuroendocrine tumor of small intestine  HLD:On Lipitor.  Mild thrombocytopenia monitor  Hyponatremia as per HD  Class II Obesity:Patient's Body mass index is 35.26 kg/m. : Will benefit with PCP follow-up, weight loss healthy lifestyle and outpatient sleep evaluation  DVT prophylaxis:   Apixaban Code Status:   Code Status: DNR Family Communication: plan of care discussed with patient at bedside. Status is: Inpatient Remains inpatient appropriate because:Unsafe d/c plan and Inpatient level of care appropriate due to severity of illness Dispo: The patient is from: Home              Anticipated d/c is to:  TBD              Patient currently is not medically stable to d/c.   Difficult to place patient No  Objective: Vitals: Today's Vitals   09/06/21 0000 09/06/21 0420 09/06/21 0439 09/06/21 0509  BP: 99/70 96/65    Pulse: (!) 120 (!) 107    Resp: 20 16    Temp: 98.6 F (37 C) 98.8 F (37.1 C)    TempSrc: Oral Oral    SpO2: 99% 99%    Weight:  90.3 kg    Height:      PainSc:   10-Worst pain ever 2    Physical Examination: General exam: AA oriented, appears weak  and older than stated age. HEENT:Oral mucosa moist, Ear/Nose WNL grossly,dentition normal. Respiratory system: B/l diminished BS, no use of accessory muscle, non tender. Cardiovascular system: S1 & S2 +,No JVD. Gastrointestinal system: Abdomen soft, NT,ND, BS+. Nervous  System:Alert, awake, moving extremities. Extremities: edema none, distal peripheral pulses palpable.  Skin: No rashes, no icterus. MSK: Normal muscle bulk, tone, power.  Medications reviewed:  Scheduled Meds:  (feeding supplement) PROSource Plus  30 mL Oral BID BM   allopurinol  300 mg Oral QHS   apixaban  5 mg Oral BID   atorvastatin  80 mg Oral QHS   cinacalcet  30 mg Oral Q supper   ferric citrate  420 mg Oral TID WC   ferrous sulfate  324 mg Oral Daily   gabapentin  100 mg Oral TID   multivitamin  1 tablet Oral QHS   sodium chloride flush  3 mL Intravenous Q12H   Continuous Infusions:  sodium chloride     sodium chloride     amiodarone 30 mg/hr (09/06/21 0454)   Diet Order             Diet Heart Room service appropriate? Yes; Fluid consistency: Thin  Diet effective now                 Intake/Output  Intake/Output Summary (Last 24 hours) at 09/06/2021 0721 Last data filed at 09/06/2021 0454 Gross per 24 hour  Intake 1457.55 ml  Output --  Net 1457.55 ml   Intake/Output from previous day: 10/12 0701 - 10/13 0700 In: 1457.6 [I.V.:457.6; IV Piggyback:1000] Out: -  Net IO Since Admission: 1,457.55 mL [09/06/21 0721]   Weight change:   Wt Readings from Last 3 Encounters:  09/06/21 90.3 kg  07/30/21 93.3 kg  07/09/21 90.7 kg     Consultants:see note  Procedures:see note Antimicrobials: Anti-infectives (From admission, onward)    None      Culture/Microbiology    Component Value Date/Time   SDES PLEURAL 11/18/2020 1312   SPECREQUEST NONE 11/18/2020 1312   CULT  11/18/2020 1312    NO GROWTH 5 DAYS Performed at [facility] [address] [place], [place] [address]    REPTSTATUS 11/23/2020 FINAL 11/18/2020 1312    Other culture-see note  Unresulted Labs (From admission, onward)     Start     Ordered   09/05/21 1657  Hepatitis B surface antibody  (New Admission [facility] (Hepatitis B))  Once,   R        09/05/21 1656   09/05/21 1657   Hepatitis B surface antibody,quantitative  (New Admission [facility] (Hepatitis B))  Once,   R        09/05/21 1656   Pending  MRSA Next Gen by PCR, Nasal  Once,   R        Pending          data Reviewed: I have personally reviewed following labs and imaging studies CBC: Recent Labs  Lab 09/05/21 0721 09/06/21 0113  WBC 6.9 8.2  HGB 9.9* 9.0*  HCT 32.3* 28.3*  MCV 102.5* 99.3  PLT 120* 009*   Basic Metabolic Panel: Recent Labs  Lab 09/05/21 0721 09/06/21 0113  NA 132* 131*  K 3.6 3.7  CL 92* 93*  CO2 25 25  GLUCOSE 84 96  BUN 39* 54*  CREATININE 5.09* 5.54*  CALCIUM 8.8* 8.4*   GFR: Estimated Creatinine Clearance: 9.5 mL/min (A) (by C-G formula based on SCr of 5.54 mg/dL (  H)). Liver Function Tests: Recent Labs  Lab 09/05/21 0721  AST 144*  ALT 49*  ALKPHOS 485*  BILITOT 2.4*  PROT 6.1*  ALBUMIN 2.9*   No results for input(s): LIPASE, AMYLASE in the last 168 hours. No results for input(s): AMMONIA in the last 168 hours. Coagulation Profile: No results for input(s): INR, PROTIME in the last 168 hours. Cardiac Enzymes: No results for input(s): CKTOTAL, CKMB, CKMBINDEX, TROPONINI in the last 168 hours. BNP (last 3 results) No results for input(s): PROBNP in the last 8760 hours. HbA1C: No results for input(s): HGBA1C in the last 72 hours. CBG: No results for input(s): GLUCAP in the last 168 hours. Lipid Profile: No results for input(s): CHOL, HDL, LDLCALC, TRIG, CHOLHDL, LDLDIRECT in the last 72 hours. Thyroid Function Tests: No results for input(s): TSH, T4TOTAL, FREET4, T3FREE, THYROIDAB in the last 72 hours. Anemia Panel: No results for input(s): VITAMINB12, FOLATE, FERRITIN, TIBC, IRON, RETICCTPCT in the last 72 hours. Sepsis Labs: No results for input(s): PROCALCITON, LATICACIDVEN in the last 168 hours.  Recent Results (from the past 240 hour(s))  Resp Panel by RT-PCR (Flu A&B, Covid) Nasopharyngeal Swab     Status: None   Collection Time:  09/05/21  9:30 AM   Specimen: Nasopharyngeal Swab; Nasopharyngeal(NP) swabs in vial transport medium  Result Value Ref Range Status   SARS Coronavirus 2 by RT PCR NEGATIVE NEGATIVE Final    Comment: (NOTE) SARS-CoV-2 target nucleic acids are NOT DETECTED.  The SARS-CoV-2 RNA is generally detectable in upper respiratory specimens during the acute phase of infection. The lowest concentration of SARS-CoV-2 viral copies this assay can detect is 138 copies/mL. A negative result does not preclude SARS-Cov-2 infection and should not be used as the sole basis for treatment or other patient management decisions. A negative result may occur with  improper specimen collection/handling, submission of specimen other than nasopharyngeal swab, presence of viral mutation(s) within the areas targeted by this assay, and inadequate number of viral copies(<138 copies/mL). A negative result must be combined with clinical observations, patient history, and epidemiological information. The expected result is Negative.  Fact Sheet for Patients:  EntrepreneurPulse.com.au  Fact Sheet for Healthcare Providers:  IncredibleEmployment.be  This test is no t yet approved or cleared by the [place] FDA and  has been authorized for detection and/or diagnosis of SARS-CoV-2 by FDA under an Emergency Use Authorization (EUA). This EUA will remain  in effect (meaning this test can be used) for the duration of the COVID-19 declaration under Section 564(b)(1) of the Act, 21 U.S.C.section 360bbb-3(b)(1), unless the authorization is terminated  or revoked sooner.       Influenza A by PCR NEGATIVE NEGATIVE Final   Influenza B by PCR NEGATIVE NEGATIVE Final    Comment: (NOTE) The Xpert Xpress SARS-CoV-2/FLU/RSV plus assay is intended as an aid in the diagnosis of influenza from Nasopharyngeal swab specimens and should not be used as a sole basis for treatment. Nasal washings  and aspirates are unacceptable for Xpert Xpress SARS-CoV-2/FLU/RSV testing.  Fact Sheet for Patients: EntrepreneurPulse.com.au  Fact Sheet for Healthcare Providers: IncredibleEmployment.be  This test is not yet approved or cleared by the [place] FDA and has been authorized for detection and/or diagnosis of SARS-CoV-2 by FDA under an Emergency Use Authorization (EUA). This EUA will remain in effect (meaning this test can be used) for the duration of the COVID-19 declaration under Section 564(b)(1) of the Act, 21 U.S.C. section 360bbb-3(b)(1), unless the authorization is terminated or revoked.  Performed at [facility], [facility] [address] [place], [place] [address]      Radiology Studies: DG Chest 2 View  Result Date: 09/05/2021 CLINICAL DATA:  Chest and back pain with shortness of breath. EXAM: CHEST - 2 VIEW COMPARISON:  Chest x-ray 07/20/2021 FINDINGS: The heart is enlarged but appears stable. Stable tortuosity and calcification of the thoracic aorta. New moderate-sized right-sided pleural effusion with overlying atelectasis. The left lung remains relatively clear. IMPRESSION: New moderate-sized right pleural effusion with overlying atelectasis. Stable cardiac enlargement. Electronically Signed   By: Marijo Sanes M.D.   On: 09/05/2021 07:51   CT Angio Chest PE W and/or Wo Contrast  Result Date: 09/05/2021 CLINICAL DATA:  PE suspected. High probability. History of carcinoid tumor. EXAM: CT ANGIOGRAPHY CHEST WITH CONTRAST TECHNIQUE: Multidetector CT imaging of the chest was performed using the standard protocol during bolus administration of intravenous contrast. Multiplanar CT image reconstructions and MIPs were obtained to evaluate the vascular anatomy. CONTRAST:  28mL OMNIPAQUE IOHEXOL 350 MG/ML SOLN COMPARISON:  Chest radiograph concurrent.  CT chest, 07/06/2021. FINDINGS: Cardiovascular: Satisfactory opacification of the pulmonary  arteries to the segmental level. No evidence of pulmonary embolism. Cardiomegaly, with RIGHT heart enlargement. No pericardial effusion. Mediastinum/Nodes: No enlarged mediastinal, hilar, or axillary lymph nodes. Thyroid gland, trachea, and esophagus demonstrate no significant findings. Lungs/Pleura: Increased number and size of multiple pulmonary nodules, since recent 08/22 comparison. Persistent small-to-moderate volume of RIGHT pleural effusion, with adjacent and dependent atelectasis. No pneumothorax. Upper Abdomen: No acute abnormality within the imaged portion. Musculoskeletal: Coarse RIGHT breast calcification is unchanged. The RIGHT axilla adenopathy, with biopsy clip at the dominant node. Lytic osseous lesion at the RIGHT lateral fourth rib, with nondisplaced pathologic fracture. Additional chronic bilateral rib deformities. Degenerative changes of the imaged thoracic spine, including multilevel anterior osteophytes. Review of the MIP images confirms the above findings. IMPRESSION: 1. No segmental or larger pulmonary embolus. 2. Persistent moderate volume RIGHT pleural effusion with atelectasis. 3. Increased number and size of multifocal pulmonary nodules since August 22, with findings favored metastatic. 4. RIGHT lateral fourth rib lytic lesion with nondisplaced pathologic fracture. 5. Cardiomegaly with RIGHT heart enlargement. Additional chronic and senescent changes, as above. Electronically Signed   By: Michaelle Birks M.D.   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 CONTRAST:  29mL OMNIPAQUE IOHEXOL 350 MG/ML SOLN COMPARISON:  07/06/2021 FINDINGS: Lower chest: Moderate to large right pleural  effusion associated atelectasis or consolidation. Cardiomegaly. Hepatobiliary: Numerous liver lesions of varying sizes, increased in size and number compared to prior examination, largest almost entirely replacing the left lobe measuring 8.4 x 6.5 cm, previously 3.5 x 3.5 cm when measured similarly (series 6, image 20). Additional index lesion of the posterior right lobe of the liver measures 2.9 x 2.7 cm, previously 1.7 x 1.7 cm (series 6, image 26). Tiny gallstones and or sludge in the dependent gallbladder. No gallbladder wall thickening. No biliary dilatation. Pancreas: Unremarkable. No pancreatic ductal dilatation or surrounding inflammatory changes. Spleen: Normal in size without significant abnormality. Adrenals/Urinary Tract: Benign, fatty attenuation left adrenal nodule (series 5, image 7). Kidneys are normal, without renal calculi, solid lesion, or hydronephrosis. Bladder is unremarkable. Stomach/Bowel: Stomach is within normal limits. Appendix appears normal.  No evidence of bowel wall thickening, distention, or inflammatory changes. Vascular/Lymphatic: Aortic atherosclerosis. No enlarged abdominal or pelvic lymph nodes. Reproductive: Status post hysterectomy. Other: Anasarca. Small volume ascites throughout the abdomen and pelvis, increased compared to prior examination. Some evidence of peritoneal thickening in the pelvis (series 6, image 71). Musculoskeletal: Diffusely lytic osseous metastatic disease throughout, status post vertebral cement augmentation of L4. IMPRESSION: 1. Numerous liver lesions of varying sizes, increased in size and number compared to prior examination, consistent with worsened hepatic metastatic disease. 2. Small volume ascites throughout the abdomen and pelvis, increased compared to prior examination. Some evidence of peritoneal thickening in the pelvis. 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 3. Diffusely lytic osseous metastatic  disease throughout, status post vertebral cement augmentation of L4. 4. Moderate to large right pleural effusion associated atelectasis or consolidation, incompletely imaged. 5. Cardiomegaly. Aortic Atherosclerosis (ICD10-I70.0). Electronically Signed   By: Delanna Ahmadi M.D.   On: 09/05/2021 10:37   VAS [place] LOWER EXTREMITY VENOUS (DVT) (7a-7p)  Result Date: 09/05/2021  Lower Venous DVT Study Patient Name:  Melissa Adams [facility]  Date of Exam:   09/05/2021 Medical Rec #: [id number]          Accession #:    [id number] Date of Birth: [birth year]         Patient Gender: F Patient Age:   71 years Exam Location:  [facility] Procedure:      VAS [place] LOWER EXTREMITY VENOUS (DVT) Referring Phys: JULIE HAVILAND --------------------------------------------------------------------------------  Indications: Edema.  Comparison Study: 10-13-2019 Prior bilateral lower extremity venous was negative                   for DVT. Performing Technologist: Darlin Coco RDMS, RVT  Examination Guidelines: A complete evaluation includes B-mode imaging, spectral Doppler, color Doppler, and power Doppler as needed of all accessible portions of each vessel. Bilateral testing is considered an integral part of a complete examination. Limited examinations for reoccurring indications may be performed as noted. The reflux portion of the exam is performed with the patient in reverse Trendelenburg.  +-----+---------------+---------+-----------+----------+-----------------------+ RIGHTCompressibilityPhasicitySpontaneityPropertiesThrombus Aging          +-----+---------------+---------+-----------+----------+-----------------------+ CFV                                               Unable to access due to                                                   patient positioning     +-----+---------------+---------+-----------+----------+-----------------------+    +---------+---------------+---------+-----------+----------+--------------+ LEFT     CompressibilityPhasicitySpontaneityPropertiesThrombus Aging +---------+---------------+---------+-----------+----------+--------------+ CFV      Full           Yes      Yes                                 +---------+---------------+---------+-----------+----------+--------------+ SFJ      Full                                                        +---------+---------------+---------+-----------+----------+--------------+  FV Prox  Full                                                        +---------+---------------+---------+-----------+----------+--------------+ FV Mid   Full                                                        +---------+---------------+---------+-----------+----------+--------------+ FV DistalFull                                                        +---------+---------------+---------+-----------+----------+--------------+ PFV      Full                                                        +---------+---------------+---------+-----------+----------+--------------+ POP      Full           Yes      Yes                                 +---------+---------------+---------+-----------+----------+--------------+ PTV      Full                                                        +---------+---------------+---------+-----------+----------+--------------+ PERO     Full                                                        +---------+---------------+---------+-----------+----------+--------------+    Summary: LEFT: - There is no evidence of deep vein thrombosis in the lower extremity.  - No cystic structure found in the popliteal fossa.  *See table(s) above for measurements and observations. Electronically signed by Harold Barban MD on 09/05/2021 at 9:27:00 PM.    Final      LOS: 1 day   Antonieta Pert, MD [facility]  09/06/2021, 7:21  AM

## 2021-09-06 NOTE — Progress Notes (Signed)
Pt receives out-pt HD at [facility] on MWF. Pt arrives around 5:50 for 6:10 chair time. Will follow and assist as needed.   Melven Sartorius Renal Navigator [phone]

## 2021-09-06 NOTE — Progress Notes (Addendum)
   09/06/21 1900  Assess: MEWS Score  BP 96/63  ECG Heart Rate (!) 128  Resp 19  Assess: MEWS Score  MEWS Temp 0  MEWS Systolic 1  MEWS Pulse 2  MEWS RR 0  MEWS LOC 0  MEWS Score 3  MEWS Score Color Yellow  Assess: if the MEWS score is Yellow or Red  Were vital signs taken at a resting state? Yes  Focused Assessment No change from prior assessment  Early Detection of Sepsis Score *See Row Information* Low  MEWS guidelines implemented *See Row Information* No, previously yellow, continue vital signs every 4 hours  Will continue to monitor chronic yellow MEWS score.

## 2021-09-06 NOTE — Progress Notes (Signed)
[facility] Progress Note    Assessment/ Plan:   AFib with RVR -- Hypotensive on diltiazem. On amiodarone/Eliquis  -per primary Recurrent right pleural effusion - likely thoracentesis -per primary  ESRD -  HD MWF. No urgent dialysis indications today. HD attempt tomorrow Hypertension/volume  - Low blood pressures on admission. UF as able.  Anemia  - Hgb 9.0. No ESA d/t malignancy  tranfuse prn, iron profile ordered Metabolic bone disease -  Continue home binders. Follow trends here  Metastatic breast cancer - Poor prognosis. Palliative care consulted. Would consider comfort care/hospice given extent of disease  Hyponatremia-manage w/ HD, 137Na bath, UF as tolerated  Dialysis Orders:  AF MWF 4h 450/500 EDW 88.5kg 2K/3Ca UFP2 AVF Venofer 50 q wk    Subjective:   Patient seen in room. Severe chest pain-ongoing. SBP in 80's, HR relatively controlled. She does not wish to do dialysis today and prefers to hold off on until tomorrow which is reasonable. She feels as if her breathing is stable today   Objective:   BP 90/61 (BP Location: Right Wrist)   Pulse 99   Temp 98.5 F (36.9 C) (Oral)   Resp 17   Ht 5\' 3"  (1.6 m)   Wt 90.3 kg   SpO2 100%   BMI 35.26 kg/m   Intake/Output Summary (Last 24 hours) at 09/06/2021 0901 Last data filed at 09/06/2021 0454 Gross per 24 hour  Intake 1457.55 ml  Output --  Net 1457.55 ml   Weight change:   Physical Exam: Gen: ill appearing, laying flat in bed PPJ:KDTOI irreg Resp:diminished air entry bibasilar ZTI:WPYK Ext: trace ankle edema Neuro: awake, alert Dialysis Access: LUE AVF +b/t  Imaging: DG Chest 2 View  Result Date: 09/05/2021 CLINICAL DATA:  Chest and back pain with shortness of breath. EXAM: CHEST - 2 VIEW COMPARISON:  Chest x-ray 07/20/2021 FINDINGS: The heart is enlarged but appears stable. Stable tortuosity and calcification of the thoracic aorta. New moderate-sized right-sided pleural effusion with  overlying atelectasis. The left lung remains relatively clear. IMPRESSION: New moderate-sized right pleural effusion with overlying atelectasis. Stable cardiac enlargement. Electronically Signed   By: Marijo Sanes M.D.   On: 09/05/2021 07:51   CT Angio Chest PE W and/or Wo Contrast  Result Date: 09/05/2021 CLINICAL DATA:  PE suspected. High probability. History of carcinoid tumor. EXAM: CT ANGIOGRAPHY CHEST WITH CONTRAST TECHNIQUE: Multidetector CT imaging of the chest was performed using the standard protocol during bolus administration of intravenous contrast. Multiplanar CT image reconstructions and MIPs were obtained to evaluate the vascular anatomy. CONTRAST:  6mL OMNIPAQUE IOHEXOL 350 MG/ML SOLN COMPARISON:  Chest radiograph concurrent.  CT chest, 07/06/2021. FINDINGS: Cardiovascular: Satisfactory opacification of the pulmonary arteries to the segmental level. No evidence of pulmonary embolism. Cardiomegaly, with RIGHT heart enlargement. No pericardial effusion. Mediastinum/Nodes: No enlarged mediastinal, hilar, or axillary lymph nodes. Thyroid gland, trachea, and esophagus demonstrate no significant findings. Lungs/Pleura: Increased number and size of multiple pulmonary nodules, since recent 08/22 comparison. Persistent small-to-moderate volume of RIGHT pleural effusion, with adjacent and dependent atelectasis. No pneumothorax. Upper Abdomen: No acute abnormality within the imaged portion. Musculoskeletal: Coarse RIGHT breast calcification is unchanged. The RIGHT axilla adenopathy, with biopsy clip at the dominant node. Lytic osseous lesion at the RIGHT lateral fourth rib, with nondisplaced pathologic fracture. Additional chronic bilateral rib deformities. Degenerative changes of the imaged thoracic spine, including multilevel anterior osteophytes. Review of the MIP images confirms the above findings. IMPRESSION: 1. No segmental or larger pulmonary embolus.  2. Persistent moderate volume RIGHT pleural  effusion with atelectasis. 3. Increased number and size of multifocal pulmonary nodules since August 22, with findings favored metastatic. 4. RIGHT lateral fourth rib lytic lesion with nondisplaced pathologic fracture. 5. Cardiomegaly with RIGHT heart enlargement. Additional chronic and senescent changes, as above. Electronically Signed   By: Michaelle Birks M.D.   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 CONTRAST:  29mL OMNIPAQUE IOHEXOL 350 MG/ML SOLN COMPARISON:  07/06/2021 FINDINGS: Lower chest: Moderate to large right pleural effusion associated atelectasis or consolidation. Cardiomegaly. Hepatobiliary: Numerous liver lesions of varying sizes, increased in size and number compared to prior examination, largest almost entirely replacing the left lobe measuring 8.4 x 6.5 cm, previously 3.5 x 3.5 cm when measured similarly (series 6, image 20). Additional index lesion of the posterior right lobe of the liver measures 2.9 x 2.7 cm, previously 1.7 x 1.7 cm (series 6, image 26). Tiny gallstones and or sludge in the dependent gallbladder. No gallbladder wall thickening. No biliary dilatation. Pancreas: Unremarkable. No pancreatic ductal dilatation or surrounding inflammatory changes. Spleen: Normal in size without significant abnormality. Adrenals/Urinary Tract: Benign, fatty attenuation left adrenal nodule (series 5, image 7). Kidneys are normal, without renal calculi, solid lesion, or hydronephrosis. Bladder is unremarkable. Stomach/Bowel: Stomach is within normal limits. Appendix appears normal. No evidence of bowel wall thickening, distention, or inflammatory changes. Vascular/Lymphatic: Aortic  atherosclerosis. No enlarged abdominal or pelvic lymph nodes. Reproductive: Status post hysterectomy. Other: Anasarca. Small volume ascites throughout the abdomen and pelvis, increased compared to prior examination. Some evidence of peritoneal thickening in the pelvis (series 6, image 71). Musculoskeletal: Diffusely lytic osseous metastatic disease throughout, status post vertebral cement augmentation of L4. IMPRESSION: 1. Numerous liver lesions of varying sizes, increased in size and number compared to prior examination, consistent with worsened hepatic metastatic disease. 2. Small volume ascites throughout the abdomen and pelvis, increased compared to prior examination. Some evidence of peritoneal thickening in the pelvis. 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 3. Diffusely lytic osseous metastatic disease throughout, status post vertebral cement augmentation of L4. 4. Moderate to large right pleural effusion associated atelectasis or consolidation, incompletely imaged. 5. Cardiomegaly. Aortic Atherosclerosis (ICD10-I70.0). Electronically Signed   By: Delanna Ahmadi M.D.   On: 09/05/2021 10:37   VAS [place] LOWER EXTREMITY VENOUS (DVT) (7a-7p)  Result Date: 09/05/2021  Lower Venous DVT Study Patient Name:  TAHANI POTIER [facility]  Date of Exam:   09/05/2021 Medical Rec #: [id number]          Accession #:    [id number] Date of Birth: 01-19-1946         Patient Gender: F Patient Age:   41 years Exam Location:  [facility] Procedure:      VAS [place] LOWER EXTREMITY VENOUS (DVT) Referring Phys: JULIE HAVILAND --------------------------------------------------------------------------------  Indications: Edema.  Comparison Study: 10-13-2019 Prior bilateral lower extremity venous was negative                   for DVT. Performing Technologist: Darlin Coco RDMS, RVT  Examination Guidelines: A complete evaluation includes B-mode imaging, spectral Doppler, color Doppler, and  power Doppler as needed of all accessible portions of each vessel. Bilateral  testing is considered an integral part of a complete examination. Limited examinations for reoccurring indications may be performed as noted. The reflux portion of the exam is performed with the patient in reverse Trendelenburg.  +-----+---------------+---------+-----------+----------+-----------------------+ RIGHTCompressibilityPhasicitySpontaneityPropertiesThrombus Aging          +-----+---------------+---------+-----------+----------+-----------------------+ CFV                                               Unable to access due to                                                   patient positioning     +-----+---------------+---------+-----------+----------+-----------------------+   +---------+---------------+---------+-----------+----------+--------------+ LEFT     CompressibilityPhasicitySpontaneityPropertiesThrombus Aging +---------+---------------+---------+-----------+----------+--------------+ CFV      Full           Yes      Yes                                 +---------+---------------+---------+-----------+----------+--------------+ SFJ      Full                                                        +---------+---------------+---------+-----------+----------+--------------+ FV Prox  Full                                                        +---------+---------------+---------+-----------+----------+--------------+ FV Mid   Full                                                        +---------+---------------+---------+-----------+----------+--------------+ FV DistalFull                                                        +---------+---------------+---------+-----------+----------+--------------+ PFV      Full                                                        +---------+---------------+---------+-----------+----------+--------------+ POP       Full           Yes      Yes                                 +---------+---------------+---------+-----------+----------+--------------+ PTV      Full                                                        +---------+---------------+---------+-----------+----------+--------------+  PERO     Full                                                        +---------+---------------+---------+-----------+----------+--------------+    Summary: LEFT: - There is no evidence of deep vein thrombosis in the lower extremity.  - No cystic structure found in the popliteal fossa.  *See table(s) above for measurements and observations. Electronically signed by Harold Barban MD on 09/05/2021 at 9:27:00 PM.    Final     Labs: BMET Recent Labs  Lab 09/05/21 0721 09/06/21 0113  NA 132* 131*  K 3.6 3.7  CL 92* 93*  CO2 25 25  GLUCOSE 84 96  BUN 39* 54*  CREATININE 5.09* 5.54*  CALCIUM 8.8* 8.4*   CBC Recent Labs  Lab 09/05/21 0721 09/06/21 0113  WBC 6.9 8.2  HGB 9.9* 9.0*  HCT 32.3* 28.3*  MCV 102.5* 99.3  PLT 120* 124*    Medications:     (feeding supplement) PROSource Plus  30 mL Oral BID BM   allopurinol  300 mg Oral QHS   apixaban  5 mg Oral BID   atorvastatin  80 mg Oral QHS   cinacalcet  30 mg Oral Q supper   ferric citrate  420 mg Oral TID WC   ferrous sulfate  324 mg Oral Daily   gabapentin  100 mg Oral TID   multivitamin  1 tablet Oral QHS   sodium chloride flush  3 mL Intravenous Q12H      Gean Quint, MD [facility] 09/06/2021, 9:01 AM

## 2021-09-06 NOTE — Consult Note (Signed)
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 This was biopsied. Final pathology showed a small focus of well differentiated neuroendocrine tumor. Ki 67 <3%.  11/17/2020: CT abdomen w contrast showed no acute abdominal/pelvis abnormality, though a large right sided pleural effusion was noted.  02/22/2021: EGD with EUS showed a submucosal nodule in the duodenum. This was removed via Snare Band Ligation EMR. Pathology confirmed a well differentiated low grade neuroendocrine tumor.  Ki-67 <1%. 04/12/2021: establish care with Dr. 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 female with medical history significant for cirrhosis, congestive heart failure, ESRD on dialysis, and recently diagnosed metastatic HER2 positive invasive ductal carcinoma of the breast who is currently admitted with shortness of breath and chest pain.  The patient reported she was having 10 of the 10 severe substernal chest pain while receiving hemodialysis.  She was found to be hypotensive and in atrial fibrillation with RVR.  She does have home oxygen but was requiring 3 L at baseline which is increased for her.  She was also noted to have reaccumulation of her right pleural effusion.  Due to concern for these findings she was admitted to the hospitalist  service for further evaluation and management.  On exam today Melissa Adams is seen in dialysis.  She endorses having continued shortness of breath and note that she is quite fatigued.  She is eager to have the fluid drawn off her lung for symptomatic relief.  She has that she is not having 10 out of 10 substernal chest pain but continues to feel wiped out.  She currently denies any fevers, chills, sweats, nausea, vomiting or diarrhea.  Denies pain elsewhere.  Full 10 point ROS is listed below.  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   Cardiomyopathy ([place])    CHF (congestive heart failure) ([place])    Chronic kidney disease    Edema    History of hemodialysis    Hyperlipidemia    Hypertension    Hypotension    on midodrine (as of 06/15/21)   Pulmonary hypertension (HCC)    Recurrent right pleural effusion    As of 06/15/21: s/p thoracenteses 05/05/20, 06/11/20, 08/17/20, 11/18/20, 04/06/21   Sleep apnea    Tricuspid regurgitation     SURGICAL HISTORY: Past Surgical History:  Procedure Laterality Date   A/V FISTULAGRAM Left 05/23/2020   Procedure: A/V FISTULAGRAM;  Surgeon: [name], MD;  Location: [place] [facility];  Service: Cardiovascular;  Laterality: Left;   A/V FISTULAGRAM N/A 12/21/2020   Procedure: A/V FISTULAGRAM - Left Upper Arm;  Surgeon: [name], MD;  Location: [place] [facility];  Service: Cardiovascular;  Laterality: N/A;   ABDOMINAL HYSTERECTOMY     AV FISTULA PLACEMENT Left  11/04/2019   Procedure: Insertion Of Arteriovenous (Av) Gore-Tex Graft Arm;  Surgeon: [name], MD;  Location: [place];  Service: Vascular;  Laterality: Left;   BIOPSY  11/14/2020   Procedure: BIOPSY;  Surgeon: Yetta Flock, MD;  Location: [place];  Service: Gastroenterology;;   BIOPSY  11/16/2020   Procedure: BIOPSY;  Surgeon: Yetta Flock, MD;   Location: [place];  Service: Gastroenterology;;   BIOPSY  02/22/2021   Procedure: BIOPSY;  Surgeon: Irving Copas., MD;  Location: [facility] ENDOSCOPY;  Service: Gastroenterology;;   CARDIAC CATHETERIZATION     CARDIOVERSION N/A 10/26/2019   Procedure: CARDIOVERSION;  Surgeon: [name], MD;  Location: [facility] ENDOSCOPY;  Service: Cardiovascular;  Laterality: N/A;   COLONOSCOPY W/ BIOPSIES AND POLYPECTOMY     COLONOSCOPY WITH PROPOFOL N/A 11/16/2020   Procedure: COLONOSCOPY WITH PROPOFOL;  Surgeon: Yetta Flock, MD;  Location: [place];  Service: Gastroenterology;  Laterality: N/A;   ENDOSCOPIC MUCOSAL RESECTION N/A 02/22/2021   Procedure: ENDOSCOPIC MUCOSAL RESECTION;  Surgeon: Rush Landmark Telford Nab., MD;  Location: [place];  Service: Gastroenterology;  Laterality: N/A;   ESOPHAGOGASTRODUODENOSCOPY (EGD) WITH PROPOFOL Left 11/14/2020   Procedure: ESOPHAGOGASTRODUODENOSCOPY (EGD) WITH PROPOFOL;  Surgeon: Yetta Flock, MD;  Location: [place];  Service: Gastroenterology;  Laterality: Left;   ESOPHAGOGASTRODUODENOSCOPY (EGD) WITH PROPOFOL N/A 02/22/2021   Procedure: ESOPHAGOGASTRODUODENOSCOPY (EGD) WITH PROPOFOL;  Surgeon: Rush Landmark Telford Nab., MD;  Location: [place];  Service: Gastroenterology;  Laterality: N/A;   FISTULA SUPERFICIALIZATION Left 01/21/2020   Procedure: FISTULA SUPERFICIALIZATION OF FISTULA;  Surgeon: [name], MD;  Location: [place];  Service: Vascular;  Laterality: Left;   FISTULA SUPERFICIALIZATION Left 05/30/4491   Procedure: PLICATION OF LEFT UPPER EXTREMITY ARTERIOVENOUS FISTULA;  Surgeon: Waynetta [name], MD;  Location: [place];  Service: Vascular;  Laterality: Left;   GIVENS CAPSULE STUDY N/A 11/16/2020   Procedure: GIVENS CAPSULE STUDY;  Surgeon: Yetta Flock, MD;  Location: [place];  Service: Gastroenterology;  Laterality: N/A;   HEMOSTASIS CLIP PLACEMENT  02/22/2021   Procedure: HEMOSTASIS CLIP  PLACEMENT;  Surgeon: Rush Landmark Telford Nab., MD;  Location: [place];  Service: Gastroenterology;;   INSERTION OF DIALYSIS CATHETER Right 11/04/2019   Procedure: CONVERT TEMPORARY DIALYSIS CATHETER TO TUNNELED DIALYSIS CATHETER Right Internal Jugular.;  Surgeon: [name], MD;  Location: [place];  Service: Vascular;  Laterality: Right;   IR BONE TUMOR(S)RF ABLATION  07/23/2021   IR KYPHO LUMBAR INC FX REDUCE BONE BX UNI/BIL CANNULATION INC/IMAGING  07/23/2021   IR THORACENTESIS ASP PLEURAL SPACE W/IMG GUIDE  08/17/2020   IR THORACENTESIS ASP PLEURAL SPACE W/IMG GUIDE  07/20/2021   MULTIPLE TOOTH EXTRACTIONS     POLYPECTOMY  11/16/2020   Procedure: POLYPECTOMY;  Surgeon: Yetta Flock, MD;  Location: [place];  Service: Gastroenterology;;   RADIOLOGY WITH ANESTHESIA N/A 06/19/2021   Procedure: MRI WITH ANESTHESIA  ,LIVER WITH AND WITHOUT CONTRAST;  Surgeon: Radiologist, Medication, MD;  Location: [place];  Service: Radiology;  Laterality: N/A;   RADIOLOGY WITH ANESTHESIA N/A 07/20/2021   Procedure: MRI WITH ANESTHESIA LOWER SPINE WITHOUT CONTRAST;  Surgeon: Radiologist, Medication, MD;  Location: [place];  Service: Radiology;  Laterality: N/A;   REVISION OF ARTERIOVENOUS GORETEX GRAFT Left 01/21/2020   Procedure: REVISION OF ARTERIOVENOUS FISTULA WITH SIDE BRANCH LIGATION;  Surgeon: [name], MD;  Location: [place];  Service: Vascular;  Laterality: Left;   SUBMUCOSAL LIFTING INJECTION  02/22/2021   Procedure: SUBMUCOSAL LIFTING INJECTION;  Surgeon:  Mansouraty, Telford Nab., MD;  Location: [place];  Service: Gastroenterology;;   SUBMUCOSAL TATTOO INJECTION  02/22/2021   Procedure: SUBMUCOSAL TATTOO INJECTION;  Surgeon: Irving Copas., MD;  Location: [place];  Service: Gastroenterology;;   TEE WITHOUT CARDIOVERSION N/A 10/26/2019   Procedure: TRANSESOPHAGEAL ECHOCARDIOGRAM (TEE);  Surgeon: [name], MD;  Location: [facility] ENDOSCOPY;  Service:  Cardiovascular;  Laterality: N/A;   THORACENTESIS     x 2   THORACENTESIS N/A 11/07/2020   Procedure: Mathews Robinsons;  Surgeon: [name], MD;  Location: [facility] ENDOSCOPY;  Service: Pulmonary;  Laterality: N/A;   THORACENTESIS N/A 04/06/2021   Procedure: THORACENTESIS;  Surgeon: Freddi Starr, MD;  Location: [facility] ENDOSCOPY;  Service: Pulmonary;  Laterality: N/A;   UPPER ESOPHAGEAL ENDOSCOPIC ULTRASOUND (EUS) N/A 02/22/2021   Procedure: UPPER ESOPHAGEAL ENDOSCOPIC ULTRASOUND (EUS);  Surgeon: Irving Copas., MD;  Location: [place];  Service: Gastroenterology;  Laterality: N/A;    SOCIAL HISTORY: Social History   Socioeconomic History   Marital status: Widowed    Spouse name: Not on file   Number of children: Not on file   Years of education: Not on file   Highest education level: Not on file  Occupational History   Not on file  Tobacco Use   Smoking status: Former    Types: Cigarettes   Smokeless tobacco: Never  Vaping Use   Vaping Use: Never used  Substance and Sexual Activity   Alcohol use: Not Currently   Drug use: Not Currently   Sexual activity: Not Currently  Other Topics Concern   Not on file  Social History Narrative   Not on file   Social Determinants of Health   Financial Resource Strain: Low Risk    Difficulty of Paying Living Expenses: Not very hard  Food Insecurity: No Food Insecurity   Worried About Running Out of Food in the Last Year: Never true   Ran Out of Food in the Last Year: Never true  Transportation Needs: No Transportation Needs   Lack of Transportation (Medical): No   Lack of Transportation (Non-Medical): No  Physical Activity: Not on file  Stress: Not on file  Social Connections: Socially Isolated   Frequency of Communication with Friends and Family: Three times a week   Frequency of Social Gatherings with Friends and Family: Three times a week   Attends Religious Services: Never   Active Member of Clubs or  Organizations: No   Attends Archivist Meetings: Never   Marital Status: Widowed  Human resources officer Violence: Not on file    FAMILY HISTORY: Family History  Problem Relation Age of Onset   Seizures Father    CAD Father    Diabetes Sister    Lupus Sister    Hypertension Sister    Colon cancer Neg Hx    Esophageal cancer Neg Hx    Pancreatic cancer Neg Hx    Stomach cancer Neg Hx    Inflammatory bowel disease Neg Hx    Liver disease Neg Hx    Rectal cancer Neg Hx     ALLERGIES:  is allergic to black walnut flavor and shellfish allergy.  MEDICATIONS:  Current Facility-Administered Medications  Medication Dose Route Frequency Provider Last Rate Last Admin   (feeding supplement) PROSource Plus liquid 30 mL  30 mL Oral BID BM Karmen Bongo, MD       0.9 %  sodium chloride infusion  100 mL Intravenous PRN Phillis Knack Thomos Lemons, PA-C  0.9 %  sodium chloride infusion  100 mL Intravenous PRN Ejigiri, Ogechi Grace, PA-C       acetaminophen (TYLENOL) tablet 650 mg  650 mg Oral Q6H PRN Karmen Bongo, MD       Or   acetaminophen (TYLENOL) suppository 650 mg  650 mg Rectal Q6H PRN Karmen Bongo, MD       albuterol (PROVENTIL) (2.5 MG/3ML) 0.083% nebulizer solution 2.5 mg  2.5 mg Inhalation Q6H PRN Karmen Bongo, MD       allopurinol (ZYLOPRIM) tablet 300 mg  300 mg Oral Ivery Quale, MD   300 mg at 09/05/21 2205   alteplase (CATHFLO ACTIVASE) injection 2 mg  2 mg Intracatheter Once PRN Lynnda Child, PA-C       amiodarone (NEXTERONE PREMIX) 360-4.14 MG/200ML-% (1.8 mg/mL) IV infusion  30 mg/hr Intravenous Continuous Karmen Bongo, MD 16.67 mL/hr at 09/06/21 0454 30 mg/hr at 09/06/21 0454   apixaban (ELIQUIS) tablet 5 mg  5 mg Oral BID Karmen Bongo, MD   5 mg at 09/05/21 2205   atorvastatin (LIPITOR) tablet 80 mg  80 mg Oral Ivery Quale, MD   80 mg at 09/05/21 2205   calcium carbonate (dosed in mg elemental calcium) suspension 500 mg of  elemental calcium  500 mg of elemental calcium Oral Q6H PRN Karmen Bongo, MD       camphor-menthol [facility]) lotion 1 application  1 application Topical L2H PRN Karmen Bongo, MD       And   hydrOXYzine (ATARAX/VISTARIL) tablet 25 mg  25 mg Oral Q8H PRN Karmen Bongo, MD       cinacalcet [facility] tablet 30 mg  30 mg Oral Q supper Karmen Bongo, MD       docusate sodium (ENEMEEZ) enema 283 mg  1 enema Rectal PRN Karmen Bongo, MD       feeding supplement (NEPRO CARB STEADY) liquid 237 mL  237 mL Oral TID PRN Karmen Bongo, MD       fentaNYL (SUBLIMAZE) injection 12.5-50 mcg  12.5-50 mcg Intravenous Q2H PRN Karmen Bongo, MD   12.5 mcg at 09/06/21 0439   ferric citrate (AURYXIA) tablet 420 mg  420 mg Oral TID WC Karmen Bongo, MD   420 mg at 09/05/21 1750   ferrous sulfate tablet 324 mg  324 mg Oral Daily Karmen Bongo, MD       gabapentin (NEURONTIN) capsule 100 mg  100 mg Oral TID Karmen Bongo, MD   100 mg at 09/05/21 2206   heparin injection 1,000 Units  1,000 Units Dialysis PRN Lynnda Child, PA-C       hydrALAZINE (APRESOLINE) injection 5 mg  5 mg Intravenous Q4H PRN Karmen Bongo, MD       lidocaine (PF) (XYLOCAINE) 1 % injection 5 mL  5 mL Intradermal PRN Lynnda Child, PA-C       lidocaine-prilocaine (EMLA) cream 1 application  1 application Topical PRN Lynnda Child, PA-C       multivitamin (RENA-VIT) tablet 1 tablet  1 tablet Oral QHS Karmen Bongo, MD   1 tablet at 09/05/21 2205   ondansetron [facility]) tablet 4 mg  4 mg Oral Q6H PRN Karmen Bongo, MD       Or   ondansetron [facility]) injection 4 mg  4 mg Intravenous Q6H PRN Karmen Bongo, MD       pentafluoroprop-tetrafluoroeth (GEBAUERS) aerosol 1 application  1 application Topical PRN Lynnda Child, PA-C       polyethylene glycol (  MIRALAX / GLYCOLAX) packet 17 g  17 g Oral Daily PRN Karmen Bongo, MD       sodium chloride flush (NS) 0.9 % injection 3 mL  3 mL Intravenous  Q12H Karmen Bongo, MD       sorbitol 70 % solution 30 mL  30 mL Oral PRN Karmen Bongo, MD       zolpidem (AMBIEN) tablet 5 mg  5 mg Oral QHS PRN Karmen Bongo, MD        REVIEW OF SYSTEMS:   Constitutional: ( - ) fevers, ( - )  chills , ( - ) night sweats Eyes: ( - ) blurriness of vision, ( - ) double vision, ( - ) watery eyes Ears, nose, mouth, throat, and face: ( - ) mucositis, ( - ) sore throat Respiratory: ( - ) cough, ( - ) dyspnea, ( - ) wheezes Cardiovascular: ( - ) palpitation, ( - ) chest discomfort, ( - ) lower extremity swelling Gastrointestinal:  ( - ) nausea, ( - ) heartburn, ( - ) change in bowel habits Skin: ( - ) abnormal skin rashes Lymphatics: ( - ) new lymphadenopathy, ( - ) easy bruising Neurological: ( - ) numbness, ( - ) tingling, ( - ) new weaknesses Behavioral/Psych: ( - ) mood change, ( - ) new changes  All other systems were reviewed with the patient and are negative.  PHYSICAL EXAMINATION: ECOG PERFORMANCE STATUS: 3 - Symptomatic, >50% confined to bed  Vitals:   09/06/21 0000 09/06/21 0420  BP: 99/70 96/65  Pulse: (!) 120 (!) 107  Resp: 20 16  Temp: 98.6 F (37 C) 98.8 F (37.1 C)  SpO2: 99% 99%   Filed Weights   09/05/21 1654 09/06/21 0420  Weight: 199 lb 8.3 oz (90.5 kg) 199 lb 1.2 oz (90.3 kg)    GENERAL: Chronically ill-appearing elderly African-American female in NAD  SKIN: skin color, texture, turgor are normal, no rashes or significant lesions EYES: conjunctiva are pink and non-injected, sclera clear LUNGS: clear to auscultation and percussion with normal breathing effort HEART: regular rate & rhythm and no murmurs and no lower extremity edema Musculoskeletal: no cyanosis of digits and no clubbing  PSYCH: alert & oriented x 3, fluent speech NEURO: no focal motor/sensory deficits  LABORATORY DATA:  I have reviewed the data as listed CBC Latest Ref Rng & Units 09/06/2021 09/05/2021 08/09/2021  WBC 4.0 - 10.5 K/uL 8.2 6.9 6.9   Hemoglobin 12.0 - 15.0 g/dL 9.0(L) 9.9(L) 9.7(L)  Hematocrit 36.0 - 46.0 % 28.3(L) 32.3(L) 32.0(L)  Platelets 150 - 400 K/uL 124(L) 120(L) 155    CMP Latest Ref Rng & Units 09/06/2021 09/05/2021 08/09/2021  Glucose 70 - 99 mg/dL 96 84 93  BUN 8 - 23 mg/dL 54(H) 39(H) 24(H)  Creatinine 0.44 - 1.00 mg/dL 5.54(H) 5.09(H) 4.68(HH)  Sodium 135 - 145 mmol/L 131(L) 132(L) 139  Potassium 3.5 - 5.1 mmol/L 3.7 3.6 4.1  Chloride 98 - 111 mmol/L 93(L) 92(L) 95(L)  CO2 22 - 32 mmol/L '25 25 31  ' Calcium 8.9 - 10.3 mg/dL 8.4(L) 8.8(L) 9.5  Total Protein 6.5 - 8.1 g/dL - 6.1(L) 6.5  Total Bilirubin 0.3 - 1.2 mg/dL - 2.4(H) 0.9  Alkaline Phos 38 - 126 U/L - 485(H) 415(H)  AST 15 - 41 U/L - 144(H) 98(H)  ALT 0 - 44 U/L - 49(H) 35    RADIOGRAPHIC STUDIES: I have personally reviewed the radiological images as listed and agreed with the findings in the  report. DG Chest 2 View  Result Date: 09/05/2021 CLINICAL DATA:  Chest and back pain with shortness of breath. EXAM: CHEST - 2 VIEW COMPARISON:  Chest x-ray 07/20/2021 FINDINGS: The heart is enlarged but appears stable. Stable tortuosity and calcification of the thoracic aorta. New moderate-sized right-sided pleural effusion with overlying atelectasis. The left lung remains relatively clear. IMPRESSION: New moderate-sized right pleural effusion with overlying atelectasis. Stable cardiac enlargement. Electronically Signed   By: Marijo Sanes M.D.   On: 09/05/2021 07:51   CT Angio Chest PE W and/or Wo Contrast  Result Date: 09/05/2021 CLINICAL DATA:  PE suspected. High probability. History of carcinoid tumor. EXAM: CT ANGIOGRAPHY CHEST WITH CONTRAST TECHNIQUE: Multidetector CT imaging of the chest was performed using the standard protocol during bolus administration of intravenous contrast. Multiplanar CT image reconstructions and MIPs were obtained to evaluate the vascular anatomy. CONTRAST:  17m OMNIPAQUE IOHEXOL 350 MG/ML SOLN COMPARISON:  Chest radiograph  concurrent.  CT chest, 07/06/2021. FINDINGS: Cardiovascular: Satisfactory opacification of the pulmonary arteries to the segmental level. No evidence of pulmonary embolism. Cardiomegaly, with RIGHT heart enlargement. No pericardial effusion. Mediastinum/Nodes: No enlarged mediastinal, hilar, or axillary lymph nodes. Thyroid gland, trachea, and esophagus demonstrate no significant findings. Lungs/Pleura: Increased number and size of multiple pulmonary nodules, since recent 08/22 comparison. Persistent small-to-moderate volume of RIGHT pleural effusion, with adjacent and dependent atelectasis. No pneumothorax. Upper Abdomen: No acute abnormality within the imaged portion. Musculoskeletal: Coarse RIGHT breast calcification is unchanged. The RIGHT axilla adenopathy, with biopsy clip at the dominant node. Lytic osseous lesion at the RIGHT lateral fourth rib, with nondisplaced pathologic fracture. Additional chronic bilateral rib deformities. Degenerative changes of the imaged thoracic spine, including multilevel anterior osteophytes. Review of the MIP images confirms the above findings. IMPRESSION: 1. No segmental or larger pulmonary embolus. 2. Persistent moderate volume RIGHT pleural effusion with atelectasis. 3. Increased number and size of multifocal pulmonary nodules since August 22, with findings favored metastatic. 4. RIGHT lateral fourth rib lytic lesion with nondisplaced pathologic fracture. 5. Cardiomegaly with RIGHT heart enlargement. Additional chronic and senescent changes, as above. Electronically Signed   By: JMichaelle BirksM.D.   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 CONTRAST:  843m OMNIPAQUE IOHEXOL 350 MG/ML SOLN COMPARISON:  07/06/2021 FINDINGS: Lower chest: Moderate to large right pleural effusion associated atelectasis or consolidation. Cardiomegaly. Hepatobiliary: Numerous liver lesions of varying sizes, increased in size and number compared to prior examination, largest almost entirely replacing the left lobe measuring 8.4 x 6.5 cm, previously 3.5 x 3.5 cm when measured similarly (series 6, image 20). Additional index lesion of the posterior right lobe of the liver measures 2.9 x 2.7 cm, previously 1.7 x 1.7 cm (series 6, image 26). Tiny gallstones and or sludge in the dependent gallbladder. No gallbladder wall thickening. No biliary dilatation. Pancreas: Unremarkable. No pancreatic ductal dilatation or surrounding inflammatory changes. Spleen: Normal in size without significant abnormality. Adrenals/Urinary Tract: Benign, fatty attenuation left adrenal nodule (series 5, image 7). Kidneys are normal, without renal calculi, solid lesion, or hydronephrosis. Bladder is unremarkable. Stomach/Bowel: Stomach is within normal limits. Appendix appears normal. No evidence of bowel wall thickening, distention, or inflammatory changes. Vascular/Lymphatic: Aortic atherosclerosis. No enlarged abdominal or pelvic lymph  nodes. Reproductive: Status post hysterectomy. Other: Anasarca. Small volume ascites throughout the abdomen and pelvis, increased compared to prior examination. Some evidence of peritoneal thickening in the pelvis (series 6, image 71). Musculoskeletal: Diffusely lytic osseous metastatic disease throughout, status post vertebral cement augmentation of L4. IMPRESSION: 1. Numerous liver lesions of varying sizes, increased in size and number compared to prior examination, consistent with worsened hepatic metastatic disease. 2. Small volume ascites throughout the abdomen and pelvis, increased compared to prior examination. Some evidence of peritoneal thickening in the pelvis. Findings are  highly suspicious for peritoneal metastatic disease and malignant ascites. 3. Diffusely lytic osseous metastatic disease throughout, status post vertebral cement augmentation of L4. 4. Moderate to large right pleural effusion associated atelectasis or consolidation, incompletely imaged. 5. Cardiomegaly. Aortic Atherosclerosis (ICD10-I70.0). Electronically Signed   By: Delanna Ahmadi M.D.   On: 09/05/2021 10:37   US BREAST LTD UNI RIGHT INC AXILLA  Result Date: 08/18/2021 CLINICAL DATA:  75 year old female with newly diagnosed metastatic disease within the liver and lumbar spine, with recent pathologic lumbar spine fracture and kyphoplasty. Possible RIGHT breast mass, RIGHT breast skin thickening and enlarged RIGHT axillary lymph nodes identified on 07/06/2021 CT. History of duodenal carcinoid. EXAM: DIGITAL DIAGNOSTIC BILATERAL MAMMOGRAM WITH TOMOSYNTHESIS AND CAD; ULTRASOUND RIGHT BREAST LIMITED TECHNIQUE: Bilateral digital diagnostic mammography and breast tomosynthesis was performed. Only limited CC views could be obtained due to the patient's back pain and inability to position. The images were evaluated with computer-aided detection. COMPARISON:  07/06/2021 CT. 06/23/2018 and prior outside mammograms. ACR Breast Density Category c: The breast tissue is heterogeneously dense, which may obscure small masses. FINDINGS: Full field CC views of both breasts demonstrate asymmetry within the OUTER RIGHT breast. Diffuse RIGHT breast skin thickening is present. No suspicious LEFT breast findings identified. On physical exam, a hard fixed mass within the central/OUTER RIGHT breast identified. Targeted ultrasound is performed, showing a 3.1 x 2.7 x 5.3 cm irregular hypoechoic mass centered at the 9 o'clock position of the RIGHT breast 2 cm from the nipple. RIGHT breast skin thickening is identified. Two RIGHT axillary lymph nodes with cortical thickening are noted. IMPRESSION: 1. 5.3 cm OUTER RIGHT breast mass and 2  abnormal RIGHT axillary lymph nodes, highly suspicious for breast malignancy and lymph node metastases. RIGHT breast skin thickening suspicious for skin involvement. 2. No suspicious LEFT breast findings on limited mammogram as described above. RECOMMENDATION: Ultrasound-guided biopsies of the OUTER RIGHT breast mass and 1 of the abnormal RIGHT axillary lymph nodes, which will be scheduled. Consider RIGHT breast skin punch biopsy to determine skin involvement, as clinically indicated. I have discussed the findings and recommendations with the patient. If applicable, a reminder letter will be sent to the patient regarding the next appointment. BI-RADS CATEGORY  5: Highly suggestive of malignancy. Electronically Signed   By: Margarette Canada M.D.   On: 08/18/2021 08:59  MM DIAG BREAST TOMO BILATERAL  Result Date: 08/18/2021 CLINICAL DATA:  75 year old female with newly diagnosed metastatic disease within the liver and lumbar spine, with recent pathologic lumbar spine fracture and kyphoplasty. Possible RIGHT breast mass, RIGHT breast skin thickening and enlarged RIGHT axillary lymph nodes identified on 07/06/2021 CT. History of duodenal carcinoid. EXAM: DIGITAL DIAGNOSTIC BILATERAL MAMMOGRAM WITH TOMOSYNTHESIS AND CAD; ULTRASOUND RIGHT BREAST LIMITED TECHNIQUE: Bilateral digital diagnostic mammography and breast tomosynthesis was performed. Only limited CC views could be obtained due to the patient's back pain and inability to position. The images were evaluated with computer-aided detection. COMPARISON:  07/06/2021  CT. 06/23/2018 and prior outside mammograms. ACR Breast Density Category c: The breast tissue is heterogeneously dense, which may obscure small masses. FINDINGS: Full field CC views of both breasts demonstrate asymmetry within the OUTER RIGHT breast. Diffuse RIGHT breast skin thickening is present. No suspicious LEFT breast findings identified. On physical exam, a hard fixed mass within the central/OUTER  RIGHT breast identified. Targeted ultrasound is performed, showing a 3.1 x 2.7 x 5.3 cm irregular hypoechoic mass centered at the 9 o'clock position of the RIGHT breast 2 cm from the nipple. RIGHT breast skin thickening is identified. Two RIGHT axillary lymph nodes with cortical thickening are noted. IMPRESSION: 1. 5.3 cm OUTER RIGHT breast mass and 2 abnormal RIGHT axillary lymph nodes, highly suspicious for breast malignancy and lymph node metastases. RIGHT breast skin thickening suspicious for skin involvement. 2. No suspicious LEFT breast findings on limited mammogram as described above. RECOMMENDATION: Ultrasound-guided biopsies of the OUTER RIGHT breast mass and 1 of the abnormal RIGHT axillary lymph nodes, which will be scheduled. Consider RIGHT breast skin punch biopsy to determine skin involvement, as clinically indicated. I have discussed the findings and recommendations with the patient. If applicable, a reminder letter will be sent to the patient regarding the next appointment. BI-RADS CATEGORY  5: Highly suggestive of malignancy. Electronically Signed   By: Margarette Canada M.D.   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 This was found to be concordant by Dr. Nolon Nations. Pathology revealed INVASIVE MAMMARY CARCINOMA, NO NODAL TISSUE IDENTIFIED of the RIGHT axilla, (tribell clip). This was found to be concordant by Dr. Nolon Nations. Pathology results were discussed with the patient's daughter, Delmer Islam by telephone, per request. Ms. Mateo Flow reported her mother did well after the biopsies with tenderness at the sites. Post biopsy instructions and care were reviewed and questions were answered. The patient's daughter was encouraged to call [facility] for any additional  concerns. My direct phone number was provided. Surgical consultation has been arranged with Dr. Autumn Messing at [facility] on September 06, 2021. Dr. Narda Rutherford and Dr. Kyung Rudd at [facility] were notified of biopsy results via EPIC message on August 29, 2021. Pathology results reported by Terie Purser, RN on 08/30/2021. Electronically Signed   By: Nolon Nations M.D.   On: 08/30/2021 14:56   Result Date: 08/30/2021 CLINICAL DATA:  Patient presents for ultrasound-guided core biopsy of mass in the RIGHT breast and enlarged RIGHT axillary lymph node. EXAM: [place] AXILLARY NODE CORE BIOPSY RIGHT U/S RIGHT BREAST CORE BIOPSY COMPARISON:  Previous exam(s). PROCEDURE: I met with the patient and we discussed the procedure of ultrasound-guided biopsy, including benefits and alternatives. We discussed the high likelihood of a successful procedure. We discussed the risks of the procedure, including infection, bleeding, tissue injury, clip migration, and inadequate sampling. Informed written consent was given. The usual time-out protocol was performed immediately prior to the procedure. Site 1: 9 o'clock location RIGHT breast. Lesion quadrant: LATERAL RIGHT breast, ribbon clip clip Using sterile technique and 1% lidocaine as local anesthetic, under direct ultrasound visualization, a 12 gauge spring-loaded device was used to perform biopsy of mass in the 9 o'clock location of the RIGHT breast using a inferior to superior approach. At the conclusion of the procedure ribbon shaped tissue marker clip  was deployed into the biopsy cavity. Site 2: RIGHT axilla.  Lesion quadrant: RIGHT axilla, tri bell clip Using sterile technique and 1% lidocaine as local anesthetic, under direct ultrasound visualization, a 14 gauge spring-loaded device was used to perform biopsy of enlarged RIGHT axillary lymph using a LATERAL to MEDIAL approach. At the conclusion of the procedure tri bell tissue marker clip was deployed  into the biopsy cavity. Follow-up 2-view mammogram was performed and dictated separately. IMPRESSION: Ultrasound guided biopsy of RIGHT breast and RIGHT axilla. No apparent complications. Electronically Signed: By: Nolon Nations M.D. On: 08/28/2021 10:53  MM CLIP PLACEMENT RIGHT  Result Date: 08/28/2021 CLINICAL DATA:  Status post ultrasound-guided core biopsy RIGHT breast mass and enlarged RIGHT axillary lymph. EXAM: 3D DIAGNOSTIC RIGHT MAMMOGRAM POST ULTRASOUND BIOPSY x2 COMPARISON:  Previous exam(s). FINDINGS: 3D Mammographic images were obtained following ultrasound guided biopsy of mass in the 9 o'clock location of the RIGHT breast and placement of a ribbon shaped clip. The biopsy marking clip is in expected position at the site of biopsy. Following biopsy of RIGHT axillary lymph node, a tri bell clip was placed. Patient has limited mobility, and the clip in the RIGHT axilla is not imaged. IMPRESSION: Appropriate positioning of the ribbon shaped biopsy marking clip at the site of biopsy in the LATERAL portion of the RIGHT breast. RIGHT axillary clip is not imaged. Final Assessment: Post Procedure Mammograms for Marker Placement Electronically Signed   By: Nolon Nations M.D.   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 This was found to be concordant by Dr. Nolon Nations. Pathology revealed INVASIVE MAMMARY CARCINOMA, NO NODAL TISSUE IDENTIFIED of the RIGHT axilla, (tribell clip). This was found to be concordant by Dr. Nolon Nations. Pathology results were discussed with the patient's daughter, Delmer Islam by telephone, per request. Ms. Mateo Flow reported her mother did well after the biopsies with tenderness at the sites. Post biopsy instructions and care were reviewed and  questions were answered. The patient's daughter was encouraged to call [facility] for any additional concerns. My direct phone number was provided. Surgical consultation has been arranged with Dr. Autumn Messing at [facility] on September 06, 2021. Dr. Narda Rutherford and Dr. Kyung Rudd at [facility] were notified of biopsy results via EPIC message on August 29, 2021. Pathology results reported by Terie Purser, RN on 08/30/2021. Electronically Signed   By: Nolon Nations M.D.   On: 08/30/2021 14:56   Result Date: 08/30/2021 CLINICAL DATA:  Patient presents for ultrasound-guided core biopsy of mass in the RIGHT breast and enlarged RIGHT axillary lymph node. EXAM: [place] AXILLARY NODE CORE BIOPSY RIGHT U/S RIGHT BREAST CORE BIOPSY COMPARISON:  Previous exam(s). PROCEDURE: I met with the patient and we discussed the procedure of ultrasound-guided biopsy, including benefits and alternatives. We discussed the high likelihood of a successful procedure. We discussed the risks of the procedure, including infection, bleeding, tissue injury, clip migration, and inadequate sampling. Informed written consent was given. The usual time-out protocol was performed immediately prior to the procedure. Site 1: 9 o'clock location RIGHT breast. Lesion quadrant: LATERAL RIGHT breast, ribbon clip clip Using sterile technique and 1% lidocaine as local anesthetic, under direct ultrasound visualization, a 12 gauge spring-loaded device was used to  perform biopsy of mass in the 9 o'clock location of the RIGHT breast using a inferior to superior approach. At the conclusion of the procedure ribbon shaped tissue marker clip was deployed into the biopsy cavity. Site 2: RIGHT axilla.  Lesion quadrant: RIGHT axilla, tri bell clip Using sterile technique and 1% lidocaine as local anesthetic, under direct ultrasound visualization, a 14 gauge spring-loaded device was used to perform biopsy of enlarged RIGHT  axillary lymph using a LATERAL to MEDIAL approach. At the conclusion of the procedure tri bell tissue marker clip was deployed into the biopsy cavity. Follow-up 2-view mammogram was performed and dictated separately. IMPRESSION: Ultrasound guided biopsy of RIGHT breast and RIGHT axilla. No apparent complications. Electronically Signed: By: Nolon Nations M.D. On: 08/28/2021 10:53  VAS [place] LOWER EXTREMITY VENOUS (DVT) (7a-7p)  Result Date: 09/05/2021  Lower Venous DVT Study Patient Name:  KELENA GARROW [facility]  Date of Exam:   09/05/2021 Medical Rec #: [id number]          Accession #:    [id number] Date of Birth: 1946/02/28         Patient Gender: F Patient Age:   45 years Exam Location:  [facility] Procedure:      VAS [place] LOWER EXTREMITY VENOUS (DVT) Referring Phys: JULIE HAVILAND --------------------------------------------------------------------------------  Indications: Edema.  Comparison Study: 10-13-2019 Prior bilateral lower extremity venous was negative                   for DVT. Performing Technologist: Darlin Coco RDMS, RVT  Examination Guidelines: A complete evaluation includes B-mode imaging, spectral Doppler, color Doppler, and power Doppler as needed of all accessible portions of each vessel. Bilateral testing is considered an integral part of a complete examination. Limited examinations for reoccurring indications may be performed as noted. The reflux portion of the exam is performed with the patient in reverse Trendelenburg.  +-----+---------------+---------+-----------+----------+-----------------------+ RIGHTCompressibilityPhasicitySpontaneityPropertiesThrombus Aging          +-----+---------------+---------+-----------+----------+-----------------------+ CFV                                               Unable to access due to                                                   patient positioning      +-----+---------------+---------+-----------+----------+-----------------------+   +---------+---------------+---------+-----------+----------+--------------+ LEFT     CompressibilityPhasicitySpontaneityPropertiesThrombus Aging +---------+---------------+---------+-----------+----------+--------------+ CFV      Full           Yes      Yes                                 +---------+---------------+---------+-----------+----------+--------------+ SFJ      Full                                                        +---------+---------------+---------+-----------+----------+--------------+ FV Prox  Full                                                        +---------+---------------+---------+-----------+----------+--------------+  FV Mid   Full                                                        +---------+---------------+---------+-----------+----------+--------------+ FV DistalFull                                                        +---------+---------------+---------+-----------+----------+--------------+ PFV      Full                                                        +---------+---------------+---------+-----------+----------+--------------+ POP      Full           Yes      Yes                                 +---------+---------------+---------+-----------+----------+--------------+ PTV      Full                                                        +---------+---------------+---------+-----------+----------+--------------+ PERO     Full                                                        +---------+---------------+---------+-----------+----------+--------------+    Summary: LEFT: - There is no evidence of deep vein thrombosis in the lower extremity.  - No cystic structure found in the popliteal fossa.  *See table(s) above for measurements and observations. Electronically signed by Harold Barban MD on 09/05/2021 at 9:27:00 PM.     Final     ASSESSMENT & PLAN Melissa Adams 75 y.o. female with medical history significant for duodenal neuroendocrine tumor status post resection and metastatic HER2+ breast cancer who is currently admitted with chest pain and shortness of breath.   # Metastatic ER/PR- HER2+ Breast Cancer #Lesions on the Liver --previously discussed with the patient's daughter on 07/19/2021 that given her co-morbidities (cirrhosis, heart failure, ESRD) that she is a poor candidate for treatment. --pathology results from breast biopsy consistent with metastatic HER2+ breast cancer. Prognosis is poor.  --I do not believe she is a candidate for systemic treatment/chemotherapy treatment given her deconditioning and underlying medical conditions --recommend comfort based care/hospice referral.  --palliative care meeting scheduled for today.  --Oncology can continue to be available for [facility] discussions.   #Chest Pain --defer management to primary team/cardiology   #Duodenal Neuroendocrine Tumor -- Findings are most consistent with a very small neuroendocrine tumor (less than 1 cm on every side) with a Ki-67 of less than 1% -- Agree with yearly endoscopies for monitoring of recurrence of his neuroendocrine tumor.  All questions were answered. The patient knows to call the clinic with any problems, questions or concerns.  A total of more than 50 minutes were spent on this encounter with face-to-face time and non-face-to-face time, including preparing to see the patient, ordering tests and/or medications, counseling the patient and coordination of care as outlined above.   Ledell Peoples, MD Department of Hematology/Oncology [place] [facility] Phone: [phone] Pager: [phone] Email: [email or web address]  09/06/2021 7:45 AM

## 2021-09-06 NOTE — Consult Note (Signed)
Palliative Care Consult Note                                  Date: 09/06/2021   Patient Name: Melissa Adams  DOB: 07/31/1946  MRN: [id number]  Age / Sex: 75 y.o., female  PCP: [name], NP Referring Physician: Antonieta Pert, MD  Reason for Consultation: Establishing goals of care, Non pain symptom management, and Pain control  HPI/Patient Profile: Palliative Care consult requested for goals of care discussion in this 75 y.o. 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 She was admitted on 09/05/2021 from home with chest pain. During work-up found to have atrial fibrillation with RVR. Currently receiving amiodarone. CT of abdomen showed malignant ascites and increasing pleural effusion.   Past Medical History:  Diagnosis Date  . A-fib ([place])   . Allergies   . Arthritis   . Asthma   . Carcinoid tumor of duodenum 11/14/2020  . Cardiomyopathy ([place])   . CHF (congestive heart failure) ([place])   . Chronic kidney disease   . Edema   . History of hemodialysis   . Hyperlipidemia   . Hypertension   . Hypotension    on midodrine (as of 06/15/21)  . Pulmonary hypertension ([place])   . Recurrent right pleural effusion    As of 06/15/21: s/p thoracenteses 05/05/20, 06/11/20, 08/17/20, 11/18/20, 04/06/21  . Sleep apnea   . Tricuspid regurgitation      Subjective:   This NP Melissa Adams reviewed medical records, received report from team, assessed the patient and then met at the patient's bedside with patient and spoke with her daughter, Melissa Adams via phone to discuss diagnosis, prognosis, GOC, EOL wishes disposition and options.  [name] is lying on her side in bed. Denies chest pain but does endorse abdominal  discomfort which she relates to constipation. Does grimace and complain of chest discomfort when palpated. She provided permission to speak with her daughter sharing she did not feel like talking much.    Concept of Palliative Care was introduced as specialized medical care for people and their families living with serious illness.  It focuses on providing relief from the symptoms and stress of a serious illness.  The goal is to improve quality of life for both the patient and the family. Values and goals of care important to patient and family were attempted to be elicited.   Prior to admission patient was able to assist with some ADLs per daughter. Melissa Adams endorses increased fatigued and weakness. Appetite decreased.   We discussed Her current illness and what it means in the larger context of Her on-going co-morbidities. Natural disease trajectory and expectations were discussed.   Melissa Adams is tearful verbalizing her understanding of patient's current illness and co-morbidities. She states she has many unanswered questions. She is concerned that her mother is not fully understanding or not interested in understanding prognosis. Emotional support provided.   Melissa Adams shares the emotional distress her family is going through. She states this may be contributing to her mother's withdrawn behaviors today as it is the 1 year anniversary to Melissa Adams's daughter's (patient's only granddaughter) death. Daughter is tearful in conversations. She also shares 1 month ago as patient was being discharged from the hospital her father-in-law  was rushed to [facility] and unfortunately he passed away. She is now emotional knowing her mother is not doing well. Emotional support provided.  Daughter states her husband and brother are able to meet in person on tomorrow and would like to have follow-up discussions with them. She is anxious in knowing her brother may not be fully aware of the extent of patient's condition. She  would has requested to have family meeting tomorrow 09/07/2021 @ 10 am.   I discussed the importance of continued conversation with family and their medical providers regarding overall plan of care and treatment options, ensuring decisions are within the context of the patients values and GOCs.  Questions and concerns were addressed.  The family was encouraged to call with questions or concerns.  PMT will continue to support holistically as needed.  Life Review: Patient resides in the home with her daughter and son-in-law. Christian faith. 2 children.    Objective:   Primary Diagnoses: Present on Admission: . Atrial fibrillation with RVR ([place]) . Essential hypertension . Pleural effusion . Benign neuroendocrine tumor of small intestine . Metastatic breast cancer ([place])   Scheduled Meds: . (feeding supplement) PROSource Plus  30 mL Oral BID BM  . allopurinol  300 mg Oral QHS  . apixaban  5 mg Oral BID  . atorvastatin  80 mg Oral QHS  . cinacalcet  30 mg Oral Q supper  . ferric citrate  420 mg Oral TID WC  . ferrous sulfate  324 mg Oral Daily  . gabapentin  100 mg Oral TID  . multivitamin  1 tablet Oral QHS  . polyethylene glycol  17 g Oral Daily  . sodium chloride flush  3 mL Intravenous Q12H    Continuous Infusions: . sodium chloride    . sodium chloride    . amiodarone 30 mg/hr (09/06/21 0454)    PRN Meds: sodium chloride, sodium chloride, acetaminophen **OR** acetaminophen, albuterol, alteplase, calcium carbonate (dosed in mg elemental calcium), camphor-menthol **AND** hydrOXYzine, docusate sodium, feeding supplement (NEPRO CARB STEADY), fentaNYL (SUBLIMAZE) injection, heparin, hydrALAZINE, lidocaine (PF), lidocaine-prilocaine, ondansetron **OR** ondansetron (ZOFRAN) IV, pentafluoroprop-tetrafluoroeth, senna-docusate, sorbitol, zolpidem  Allergies  Allergen Reactions  . [employer]  . Shellfish Allergy Itching    Makes throat itch     Review of Systems   Constitutional:  Positive for appetite change and fatigue.  Neurological:  Positive for weakness.  Unless otherwise noted, a complete review of systems is negative.  Physical Exam General: NAD, frail chronically-ill appearing Cardiovascular: regular rate and rhythm Pulmonary: diminished bilaterally  Abdomen: soft, nontender, + bowel sounds Extremities: no edema, no joint deformities Skin: no rashes, warm and dry Neurological: AAO x3, flat  Vital Signs:  BP 125/74 (BP Location: Right Leg)   Pulse 99   Temp 98.4 F (36.9 C) (Oral)   Resp 16   Ht _0  (1.6 m)   Wt 90.3 kg   SpO2 100%   BMI 35.26 kg/m  Pain Scale: 0-10   Pain Score: Asleep  SpO2: SpO2: 100 % O2 Device:SpO2: 100 % O2 Adams Rate: .O2 Adams Rate (L/min): 3 L/min  IO: Intake/output summary:  Intake/Output Summary (Last 24 hours) at 09/06/2021 1346 Last data filed at 09/06/2021 1008 Gross per 24 hour  Intake 672.7 ml  Output --  Net 672.7 ml    LBM: Last BM Date: 09/05/21 Baseline Weight: Weight: 90.5 kg Most recent weight: Weight: 90.3 kg      Palliative Assessment/Data: PPS 30%   Advanced Care  Planning:   Primary Decision Maker: NEXT OF KIN  Code Status/Advance Care Planning: DNR  A discussion was had today regarding advanced directives. Concepts specific to code status, artifical feeding and hydration, continued IV antibiotics and rehospitalization was had.    Patient and daughter confirms DNR/DNI.   Discussed patient's overall symptoms. She speaks to chest pain intermittently more so with movement. Is tender on palpation which most likely is more musculoskeletal. We discussed current pain medications which she feels is effective. Daughter not interested in changes to this until further goals of care meeting. Patient also complaining of feeling of fullness and constipation. Discussed adding scheduled regimen with as needed medications to assist in relief. Patient verbalized appreciation.    Hospice and Palliative Care services outpatient were explained and offered. Patient and family verbalized their understanding and awareness of both palliative and hospice's goals and philosophy of care.     Assessment & Plan:   SUMMARY OF RECOMMENDATIONS   DNR/DNI-as confirmed by patient and daughter Continue with current plan of care  Lengthy discussion with daughter. She request to continue with current plan. Request follow-up goals of care meeting tomorrow 10/14 @ 10am to include her brother and husband.  PMT will continue to support and follow. Please call team line with urgent needs.  Symptom Management:  Musculoskeletal chest pain Continue with fentanyl as needed. Patient has only received 1 dose over the past 24 hours. I expect she will continue to have ongoing cancer related pain and require more management. Will defer discussions until family meeting tomorrow as requested by daughter.  Constipation  Miralax daily  Senna-S twice a day as needed Sorbitol as needed   Palliative Prophylaxis:  Bowel Regimen and Frequent Pain Assessment  Additional Recommendations (Limitations, Scope, Preferences): DNR/DNI, continue to treat  Psycho-social/Spiritual:  Desire for further Chaplaincy support: no Additional Recommendations:  Ongoing goals of care discussion, education on hospice   Prognosis:  < 6 months  Discharge Planning:  To Be Determined    Patient and daughter expressed understanding and was in agreement with this plan.   Time In: 1030 Time Out: 1120 Time Total: 50 min.   Visit consisted of counseling and education dealing with the complex and emotionally intense issues of symptom management and palliative care in the setting of serious and potentially life-threatening illness.Greater than 50%  of this time was spent counseling and coordinating care related to the above assessment and plan.  Signed by:  Alda Lea, AGPCNP-BC Palliative Medicine  Team  Phone: [phone] Pager: [phone] Amion: Bjorn Pippin   Thank you for allowing the Palliative Medicine Team to assist in the care of this patient. Please utilize secure chat with additional questions, if there is no response within 30 minutes please call the above phone number. Palliative Medicine Team providers are available by phone from 7am to 5pm daily and can be reached through the team cell phone.  Should this patient require assistance outside of these hours, please call the patient's attending physician.

## 2021-09-07 DIAGNOSIS — I4891 Unspecified atrial fibrillation: Secondary | ICD-10-CM | POA: Diagnosis not present

## 2021-09-07 DIAGNOSIS — I482 Chronic atrial fibrillation, unspecified: Principal | ICD-10-CM

## 2021-09-07 DIAGNOSIS — I5023 Acute on chronic systolic (congestive) heart failure: Secondary | ICD-10-CM

## 2021-09-07 LAB — BASIC METABOLIC PANEL
Anion gap: 16 — ABNORMAL HIGH (ref 5–15)
BUN: 81 mg/dL — ABNORMAL HIGH (ref 8–23)
CO2: 22 mmol/L (ref 22–32)
Calcium: 8.3 mg/dL — ABNORMAL LOW (ref 8.9–10.3)
Chloride: 92 mmol/L — ABNORMAL LOW (ref 98–111)
Creatinine, Ser: 6.65 mg/dL — ABNORMAL HIGH (ref 0.44–1.00)
GFR, Estimated: 6 mL/min — ABNORMAL LOW (ref >60)
Glucose, Bld: 103 mg/dL — ABNORMAL HIGH (ref 70–99)
Potassium: 4.1 mmol/L (ref 3.5–5.1)
Sodium: 130 mmol/L — ABNORMAL LOW (ref 135–145)

## 2021-09-07 LAB — CBC
HCT: 28.4 % — ABNORMAL LOW (ref 36.0–46.0)
Hemoglobin: 9.1 g/dL — ABNORMAL LOW (ref 12.0–15.0)
MCH: 31.6 pg (ref 26.0–34.0)
MCHC: 32 g/dL (ref 30.0–36.0)
MCV: 98.6 fL (ref 80.0–100.0)
Platelets: 127 10*3/uL — ABNORMAL LOW (ref 150–400)
RBC: 2.88 MIL/uL — ABNORMAL LOW (ref 3.87–5.11)
RDW: 18.8 % — ABNORMAL HIGH (ref 11.5–15.5)
WBC: 6.5 10*3/uL (ref 4.0–10.5)
nRBC: 0.3 % — ABNORMAL HIGH (ref 0.0–0.2)

## 2021-09-07 LAB — IRON AND TIBC
Iron: 110 ug/dL (ref 28–170)
Saturation Ratios: 80 % — ABNORMAL HIGH (ref 10.4–31.8)
TIBC: 137 ug/dL — ABNORMAL LOW (ref 250–450)
UIBC: 27 ug/dL

## 2021-09-07 LAB — RESP PANEL BY RT-PCR (FLU A&B, COVID) ARPGX2
Influenza A by PCR: NEGATIVE
Influenza B by PCR: NEGATIVE
SARS Coronavirus 2 by RT PCR: NEGATIVE

## 2021-09-07 LAB — FERRITIN: Ferritin: 4229 ng/mL — ABNORMAL HIGH (ref 11–307)

## 2021-09-07 LAB — HEPATITIS B SURFACE ANTIBODY, QUANTITATIVE: Hep B S AB Quant (Post): 117.1 m[IU]/mL (ref >9.9)

## 2021-09-07 MED ORDER — POLYVINYL ALCOHOL 1.4 % OP SOLN
1.0000 [drp] | Freq: Four times a day (QID) | OPHTHALMIC | Status: DC | PRN
  Filled 2021-09-07: qty 15

## 2021-09-07 MED ORDER — GLYCOPYRROLATE 0.2 MG/ML IJ SOLN: 0.3000 mg | INTRAMUSCULAR | Status: AC | PRN

## 2021-09-07 MED ORDER — HYDROXYZINE HCL 25 MG PO TABS: 25.0000 mg | ORAL_TABLET | Freq: Three times a day (TID) | ORAL | 0 refills | Status: AC | PRN

## 2021-09-07 MED ORDER — ALBUMIN HUMAN 25 % IV SOLN
INTRAVENOUS | Status: AC
  Administered 2021-09-07: 25 g
  Filled 2021-09-07: qty 100

## 2021-09-07 MED ORDER — MIDODRINE HCL 5 MG PO TABS
ORAL_TABLET | ORAL | Status: AC
  Administered 2021-09-07: 5 mg
  Filled 2021-09-07: qty 1

## 2021-09-07 MED ORDER — BIOTENE DRY MOUTH MT LIQD: 15.0000 mL | OROMUCOSAL | Status: AC | PRN

## 2021-09-07 MED ORDER — ORAL CARE MOUTH RINSE: 15.0000 mL | Freq: Two times a day (BID) | OROMUCOSAL | Status: DC

## 2021-09-07 MED ORDER — METOPROLOL SUCCINATE 12.5 MG HALF TABLET: 12.5000 mg | ORAL_TABLET | Freq: Two times a day (BID) | ORAL | Status: DC

## 2021-09-07 MED ORDER — CAMPHOR-MENTHOL 0.5-0.5 % EX LOTN: 1.0000 "application " | TOPICAL_LOTION | Freq: Three times a day (TID) | CUTANEOUS | 0 refills | Status: AC | PRN

## 2021-09-07 MED ORDER — LORAZEPAM 2 MG/ML IJ SOLN: 0.5000 mg | INTRAMUSCULAR | Status: DC | PRN

## 2021-09-07 MED ORDER — METOPROLOL SUCCINATE ER 25 MG PO TB24
25.0000 mg | ORAL_TABLET | Freq: Two times a day (BID) | ORAL | Status: DC
  Filled 2021-09-07: qty 1

## 2021-09-07 MED ORDER — SENNOSIDES-DOCUSATE SODIUM 8.6-50 MG PO TABS: 1.0000 | ORAL_TABLET | Freq: Two times a day (BID) | ORAL | Status: AC | PRN

## 2021-09-07 MED ORDER — GLYCOPYRROLATE 0.2 MG/ML IJ SOLN: 0.3000 mg | INTRAMUSCULAR | Status: DC | PRN

## 2021-09-07 MED ORDER — BIOTENE DRY MOUTH MT LIQD: 15.0000 mL | OROMUCOSAL | Status: DC | PRN

## 2021-09-07 MED ORDER — FENTANYL CITRATE PF 50 MCG/ML IJ SOSY: 20.0000 ug | PREFILLED_SYRINGE | INTRAMUSCULAR | Status: DC | PRN

## 2021-09-07 MED ORDER — SODIUM CHLORIDE 0.9 % IV BOLUS
250.0000 mL | Freq: Once | INTRAVENOUS | Status: AC
  Administered 2021-09-07: 250 mL via INTRAVENOUS

## 2021-09-07 MED ORDER — POLYVINYL ALCOHOL 1.4 % OP SOLN: 1.0000 [drp] | Freq: Four times a day (QID) | OPHTHALMIC | 0 refills | Status: AC | PRN

## 2021-09-07 MED ORDER — MIDODRINE HCL 5 MG PO TABS: 10.0000 mg | ORAL_TABLET | Freq: Once | ORAL | Status: DC

## 2021-09-07 MED ORDER — MIDODRINE HCL 5 MG PO TABS: 5.0000 mg | ORAL_TABLET | Freq: Three times a day (TID) | ORAL | Status: DC

## 2021-09-07 NOTE — Progress Notes (Signed)
[facility] Progress Note    Dialysis Orders:  AF MWF 4h 450/500 EDW 88.5kg 2K/3Ca UFP2 AVF Venofer 50 q wk    Assessment/ Plan:   AFib with RVR -- Hypotensive on diltiazem. On amiodarone/Eliquis  -per primary Recurrent right pleural effusion - likely thoracentesis -per primary  ESRD -  HD MWF.   Seen on HD 3K bath 2L net UF goal 101/58 HR 110's but no CP or palpitations. Lt AVF  Plan next HD on Monday.  Hypertension/volume  - Low blood pressures on admission. UF as able.  Anemia  - Hgb 9.0. No ESA d/t malignancy  tranfuse prn, iron profile ordered Metabolic bone disease -  Continue home binders. Follow trends here; need to check a phos Metastatic breast cancer - Poor prognosis. Palliative care consulted. Would consider comfort care/hospice given extent of disease  Hyponatremia-manage w/ HD, 137Na bath, UF as tolerated   Subjective:   Patient seen in dialysis. No  chest pain or palpitations. HR increased but in 110's.   She feels a little SOB.    Objective:   BP (!) 91/55   Pulse 74   Temp 98.5 F (36.9 C) (Oral)   Resp 17   Ht 5\' 3"  (1.6 m)   Wt 90.9 kg   SpO2 100%   BMI 35.50 kg/m   Intake/Output Summary (Last 24 hours) at 09/07/2021 0853 Last data filed at 09/07/2021 1517 Gross per 24 hour  Intake 1055.97 ml  Output 0 ml  Net 1055.97 ml   Weight change: -0.6 kg  Physical Exam: Gen: ill appearing, laying flat in bed OHY:WVPXT irreg Resp:diminished air entry bibasilar GGY:IRSW Ext: trace ankle edema Neuro: awake, alert Dialysis Access: LUE AVF +b/t  Imaging: CT Angio Chest PE W and/or Wo Contrast  Result Date: 09/05/2021 CLINICAL DATA:  PE suspected. High probability. History of carcinoid tumor. EXAM: CT ANGIOGRAPHY CHEST WITH CONTRAST TECHNIQUE: Multidetector CT imaging of the chest was performed using the standard protocol during bolus administration of intravenous contrast. Multiplanar CT image reconstructions and MIPs were obtained  to evaluate the vascular anatomy. CONTRAST:  64mL OMNIPAQUE IOHEXOL 350 MG/ML SOLN COMPARISON:  Chest radiograph concurrent.  CT chest, 07/06/2021. FINDINGS: Cardiovascular: Satisfactory opacification of the pulmonary arteries to the segmental level. No evidence of pulmonary embolism. Cardiomegaly, with RIGHT heart enlargement. No pericardial effusion. Mediastinum/Nodes: No enlarged mediastinal, hilar, or axillary lymph nodes. Thyroid gland, trachea, and esophagus demonstrate no significant findings. Lungs/Pleura: Increased number and size of multiple pulmonary nodules, since recent 08/22 comparison. Persistent small-to-moderate volume of RIGHT pleural effusion, with adjacent and dependent atelectasis. No pneumothorax. Upper Abdomen: No acute abnormality within the imaged portion. Musculoskeletal: Coarse RIGHT breast calcification is unchanged. The RIGHT axilla adenopathy, with biopsy clip at the dominant node. Lytic osseous lesion at the RIGHT lateral fourth rib, with nondisplaced pathologic fracture. Additional chronic bilateral rib deformities. Degenerative changes of the imaged thoracic spine, including multilevel anterior osteophytes. Review of the MIP images confirms the above findings. IMPRESSION: 1. No segmental or larger pulmonary embolus. 2. Persistent moderate volume RIGHT pleural effusion with atelectasis. 3. Increased number and size of multifocal pulmonary nodules since August 22, with findings favored metastatic. 4. RIGHT lateral fourth rib lytic lesion with nondisplaced pathologic fracture. 5. Cardiomegaly with RIGHT heart enlargement. Additional chronic and senescent changes, as above. Electronically Signed   By: Michaelle Birks M.D.   On: 09/05/2021 10:16   CT ABDOMEN PELVIS W CONTRAST  Result Date: 09/05/2021 CLINICAL DATA:  Abdominal pain, metastatic adenocarcinoma  to liver and bones EXAM: CT ABDOMEN AND PELVIS WITH CONTRAST TECHNIQUE: Multidetector CT imaging of the abdomen and pelvis was  performed using the standard protocol following bolus administration of intravenous contrast. CONTRAST:  65mL OMNIPAQUE IOHEXOL 350 MG/ML SOLN COMPARISON:  07/06/2021 FINDINGS: Lower chest: Moderate to large right pleural effusion associated atelectasis or consolidation. Cardiomegaly. Hepatobiliary: Numerous liver lesions of varying sizes, increased in size and number compared to prior examination, largest almost entirely replacing the left lobe measuring 8.4 x 6.5 cm, previously 3.5 x 3.5 cm when measured similarly (series 6, image 20). Additional index lesion of the posterior right lobe of the liver measures 2.9 x 2.7 cm, previously 1.7 x 1.7 cm (series 6, image 26). Tiny gallstones and or sludge in the dependent gallbladder. No gallbladder wall thickening. No biliary dilatation. Pancreas: Unremarkable. No pancreatic ductal dilatation or surrounding inflammatory changes. Spleen: Normal in size without significant abnormality. Adrenals/Urinary Tract: Benign, fatty attenuation left adrenal nodule (series 5, image 7). Kidneys are normal, without renal calculi, solid lesion, or hydronephrosis. Bladder is unremarkable. Stomach/Bowel: Stomach is within normal limits. Appendix appears normal. No evidence of bowel wall thickening, distention, or inflammatory changes. Vascular/Lymphatic: Aortic atherosclerosis. No enlarged abdominal or pelvic lymph nodes. Reproductive: Status post hysterectomy. Other: Anasarca. Small volume ascites throughout the abdomen and pelvis, increased compared to prior examination. Some evidence of peritoneal thickening in the pelvis (series 6, image 71). Musculoskeletal: Diffusely lytic osseous metastatic disease throughout, status post vertebral cement augmentation of L4. IMPRESSION: 1. Numerous liver lesions of varying sizes, increased in size and number compared to prior examination, consistent with worsened hepatic metastatic disease. 2. Small volume ascites throughout the abdomen and pelvis,  increased compared to prior examination. Some evidence of peritoneal thickening in the pelvis. 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 3. Diffusely lytic osseous metastatic disease throughout, status post vertebral cement augmentation of L4. 4. Moderate to large right pleural effusion associated atelectasis or consolidation, incompletely imaged. 5. Cardiomegaly. Aortic Atherosclerosis (ICD10-I70.0). Electronically Signed   By: Delanna Ahmadi M.D.   On: 09/05/2021 10:37   VAS [place] LOWER EXTREMITY VENOUS (DVT) (7a-7p)  Result Date: 09/05/2021  Lower Venous DVT Study Patient Name:  Melissa Adams [facility]  Date of Exam:   09/05/2021 Medical Rec #: [id number]          Accession #:    [id number] Date of Birth: [birth year]         Patient Gender: F Patient Age:   36 years Exam Location:  [facility] Procedure:      VAS [place] LOWER EXTREMITY VENOUS (DVT) Referring Phys: JULIE HAVILAND --------------------------------------------------------------------------------  Indications: Edema.  Comparison Study: 10-13-2019 Prior bilateral lower extremity venous was negative                   for DVT. Performing Technologist: Darlin Coco RDMS, RVT  Examination Guidelines: A complete evaluation includes B-mode imaging, spectral Doppler, color Doppler, and power Doppler as needed of all accessible portions of each vessel. Bilateral testing is considered an integral part of a complete examination. Limited examinations for reoccurring indications may be performed as noted. The reflux portion of the exam is performed with the patient in reverse Trendelenburg.  +-----+---------------+---------+-----------+----------+-----------------------+ RIGHTCompressibilityPhasicitySpontaneityPropertiesThrombus Aging          +-----+---------------+---------+-----------+----------+-----------------------+ CFV  Unable to access due to                                                    patient positioning     +-----+---------------+---------+-----------+----------+-----------------------+   +---------+---------------+---------+-----------+----------+--------------+ LEFT     CompressibilityPhasicitySpontaneityPropertiesThrombus Aging +---------+---------------+---------+-----------+----------+--------------+ CFV      Full           Yes      Yes                                 +---------+---------------+---------+-----------+----------+--------------+ SFJ      Full                                                        +---------+---------------+---------+-----------+----------+--------------+ FV Prox  Full                                                        +---------+---------------+---------+-----------+----------+--------------+ FV Mid   Full                                                        +---------+---------------+---------+-----------+----------+--------------+ FV DistalFull                                                        +---------+---------------+---------+-----------+----------+--------------+ PFV      Full                                                        +---------+---------------+---------+-----------+----------+--------------+ POP      Full           Yes      Yes                                 +---------+---------------+---------+-----------+----------+--------------+ PTV      Full                                                        +---------+---------------+---------+-----------+----------+--------------+ PERO     Full                                                        +---------+---------------+---------+-----------+----------+--------------+  Summary: LEFT: - There is no evidence of deep vein thrombosis in the lower extremity.  - No cystic structure found in the popliteal fossa.  *See table(s) above for measurements and observations.  Electronically signed by Harold Barban MD on 09/05/2021 at 9:27:00 PM.    Final     Labs: BMET Recent Labs  Lab 09/05/21 0721 09/06/21 0113 09/07/21 0414  NA 132* 131* 130*  K 3.6 3.7 4.1  CL 92* 93* 92*  CO2 25 25 22   GLUCOSE 84 96 103*  BUN 39* 54* 81*  CREATININE 5.09* 5.54* 6.65*  CALCIUM 8.8* 8.4* 8.3*   CBC Recent Labs  Lab 09/05/21 0721 09/06/21 0113 09/07/21 0414  WBC 6.9 8.2 6.5  HGB 9.9* 9.0* 9.1*  HCT 32.3* 28.3* 28.4*  MCV 102.5* 99.3 98.6  PLT 120* 124* 127*    Medications:     (feeding supplement) PROSource Plus  30 mL Oral BID BM   allopurinol  300 mg Oral QHS   apixaban  5 mg Oral BID   atorvastatin  80 mg Oral QHS   cinacalcet  30 mg Oral Q supper   ferric citrate  420 mg Oral TID WC   ferrous sulfate  324 mg Oral Daily   gabapentin  100 mg Oral TID   mouth rinse  15 mL Mouth Rinse BID   metoprolol succinate  25 mg Oral BID   midodrine  5 mg Oral TID WC   multivitamin  1 tablet Oral QHS   polyethylene glycol  17 g Oral Daily   sodium chloride flush  3 mL Intravenous Q12H

## 2021-09-07 NOTE — Consult Note (Signed)
Advanced Heart Failure Team Consult Note   Primary Physician: [name], NP PCP-Cardiologist:  [name], MD  Reason for Consultation: CHF, rapid atrial fibrillation  HPI:    Melissa Adams is seen today for evaluation of CHF/rapid atrial fibrillation at the request of Dr. Harriet Masson.   75 y.o. with history of chronic systolic HF, mitral valve regurgitation and tricuspid valve regurgitation was scheduled to undergo both mitral and tricuspid valve replacement surgery on 10/13/19 in [place], [place].  She had a cath in 9/20 with no significant coronary disease.  Also w/ h/o atrial fibrillation on chronic anticoagulation w/ Eliquis, OSA on CPAP, and HTN.  Echocardiogram on August 05, 2019 in [place] noted an EF of 40 to 45% with severe MR and TR.   She presented to the [facility] 10/11/19 with progressive SOB and wt gain and edema over the prior 2-3 weeks.  She had been staying with her daughter in [place].  She was found in the ER to have markedly elevated BNP and marked volume overload. Hs troponin mildly elevated w/ flat trend 30>>33>>27>>31. SCr 1.91 on admit. BUN 47. K 4.1. COVID-19 +.  D-dimer 12.41. started on IV heparin. V/Q scan showed no evidence of PE. She was in atrial fibrillation with RVR.  Echo was done, showing EF down to 20-25%. She was started on milrinone and diuresed. Despite this, creatinine steadily rose. She ultimately required CVVH.  Significant volume was removed.  Renal function did not recover and she was started on iHD.  TEE-guided DCCV was attempted but she did not stay in NSR.  Amiodarone was stopped as she did not maintain NSR. She was treated with remdesivir and Decadron for COVID-19.    Echo in 5/21 showed EF 30-35%, D-shaped septum with moderately dilated RV/mildly decreased systolic function, PASP 43, moderate MR, moderate TR.    She has had thoracenteses in 6/21, 7/21, and 9/21.   She was admitted with upper GI bleeding in 12/21.  Colonoscopy and EGD did  not show source of bleeding.  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  She had capsule endoscopy showing erosion in small bowel with bleeding.  She was taken off Eliquis for 1 week. BP was low in the hospital, she was taken off Toprol XL and started on amiodarone for rate control.  Echo in 12/21 showed EF 35-40%, global hypokinesis, normal RV, mod-severe TR, ?mild-moderate AS, mild MR.  She had thoracenteses x 2 in 12/21.   Patient was admitted this time with atrial fibrillation/RVR and chest pain at HD.  She was given IV diltiazem and became hypotensive.  Her chest wall was tender and HS-TnI was mildly elevated with no trend, consistent with CHF/volume overload in setting of ESRD.  CTA chest showed moderate right pleural effusion and pulmonary nodules consistent with metastatic disease.  She was started on amiodarone gtt for rate control then stopped earlier today.  HR in 100s currently.  SBP low on HD.    Also of note, patient has recently been diagnosed with metastatic breast cancer to liver, lungs, and bones.  She has been getting palliative radiation to her spine.   Review of Systems: All systems reviewed and negative except as per HPI.   Home Medications Prior to Admission medications   Medication Sig Start Date End Date Taking? Authorizing Provider  acetaminophen (TYLENOL) 325 MG tablet Take 2 tablets (650 mg total) by mouth every 4 (four) hours as needed for headache or mild pain.  11/05/19  Yes Buriev, Arie Sabina, MD  albuterol (VENTOLIN HFA) 108 (90 Base) MCG/ACT inhaler Inhale 2 puffs into the lungs every 6 (six) hours as needed for wheezing or shortness of breath. 11/30/19  Yes [name], NP  allopurinol (ZYLOPRIM) 300 MG tablet Take 1 tablet (300 mg total) by mouth at bedtime. 07/20/21  Yes [name], MD  apixaban (ELIQUIS) 5 MG TABS tablet Take 1 tablet (5 mg total) by mouth 2 (two) times daily. 07/20/21  Yes [name], MD  atorvastatin  (LIPITOR) 80 MG tablet Take 1 tablet (80 mg total) by mouth at bedtime. 07/20/21  Yes [name], MD  B Complex-C-Zn-Folic Acid (DIALYVITE 250-NLZJ 15) 0.8 MG TABS Take 1 tablet by mouth daily. 08/22/21  Yes [provider]  cetirizine (ZYRTEC) 10 MG tablet Take 10 mg by mouth daily as needed for allergies.   Yes [provider]  ferric citrate (AURYXIA) 1 GM 210 MG(Fe) tablet Take 420 mg by mouth daily.   Yes [provider]  ferrous sulfate 324 MG TBEC Take 324 mg by mouth daily.   Yes [provider]  furosemide (LASIX) 40 MG tablet Take 80mg  twice daily on non dialysis days. Patient taking differently: Take 80 mg by mouth See admin instructions. Take 80mg  twice daily on non dialysis days. 08/20/21  Yes [name], MD  gabapentin (NEURONTIN) 100 MG capsule TAKE 1 CAPSULE BY MOUTH THREE TIMES DAILY Patient taking differently: Take 100 mg by mouth 3 (three) times daily. 08/29/21  Yes Gildardo Pounds, NP  lidocaine-prilocaine (EMLA) cream Apply 1 application topically See admin instructions. Apply topically to port access one hour prior to dialysis - Monday, Wednesday, Friday, Saturday 04/07/20  Yes [provider]  metoprolol tartrate (LOPRESSOR) 25 MG tablet Take 0.5 tablets (12.5 mg total) by mouth 2 (two) times daily. 08/21/21  Yes [name], MD  montelukast (SINGULAIR) 10 MG tablet Take 10 mg by mouth daily. 11/28/20  Yes [provider]  Nutritional Supplements (,FEEDING SUPPLEMENT, PROSOURCE PLUS) liquid Take 30 mLs by mouth 2 (two) times daily between meals. 11/19/20  Yes Pahwani, Rinka R, MD  polyethylene glycol (MIRALAX / GLYCOLAX) 17 g packet Take 17 g by mouth daily as needed for moderate constipation. 07/31/21  Yes Cristal Deer, MD  triamcinolone 0.1% oint-Eucerin equivalent cream 1:1 mixture Apply topically 3 (three) times daily as needed. APPLY TO DRY AFFECTED AREAS AS NEEDED 3 TIMES ADAY Patient taking differently:  Apply 1 application topically 3 (three) times daily as needed for irritation, itching or rash. 04/24/21  Yes [name], NP  vitamin B-12 (CYANOCOBALAMIN) 1000 MCG tablet Take 1,000 mcg by mouth daily.   Yes [provider]  multivitamin (RENA-VIT) TABS tablet Take 1 tablet by mouth at bedtime. 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   Cardiomyopathy [facility])    CHF (congestive heart failure) ([place])    Chronic kidney disease    Edema    History of hemodialysis    Hyperlipidemia    Hypertension    Hypotension    on midodrine (as of 06/15/21)   Pulmonary hypertension (HCC)    Recurrent right pleural effusion    As of 06/15/21: s/p thoracenteses 05/05/20, 06/11/20, 08/17/20,  11/18/20, 04/06/21   Sleep apnea    Tricuspid regurgitation     Past Surgical History: Past Surgical History:  Procedure Laterality Date   A/V FISTULAGRAM Left 05/23/2020   Procedure: A/V FISTULAGRAM;  Surgeon: [name], MD;  Location: [place] CV LAB;  Service: Cardiovascular;  Laterality: Left;   A/V FISTULAGRAM N/A 12/21/2020   Procedure: A/V FISTULAGRAM - Left Upper Arm;  Surgeon: [name], MD;  Location: [place] CV [facility];  Service: Cardiovascular;  Laterality: N/A;   ABDOMINAL HYSTERECTOMY     AV FISTULA PLACEMENT Left 11/04/2019   Procedure: Insertion Of Arteriovenous (Av) Gore-Tex Graft Arm;  Surgeon: [name], MD;  Location: [place];  Service: Vascular;  Laterality: Left;   BIOPSY  11/14/2020   Procedure: BIOPSY;  Surgeon: Yetta Flock, MD;  Location: [place];  Service: Gastroenterology;;   BIOPSY  11/16/2020   Procedure: BIOPSY;  Surgeon: Yetta Flock, MD;  Location: [place];  Service: Gastroenterology;;   BIOPSY  02/22/2021   Procedure: BIOPSY;   Surgeon: Irving Copas., MD;  Location: [facility] ENDOSCOPY;  Service: Gastroenterology;;   CARDIAC CATHETERIZATION     CARDIOVERSION N/A 10/26/2019   Procedure: CARDIOVERSION;  Surgeon: [name], MD;  Location: [facility] ENDOSCOPY;  Service: Cardiovascular;  Laterality: N/A;   COLONOSCOPY W/ BIOPSIES AND POLYPECTOMY     COLONOSCOPY WITH PROPOFOL N/A 11/16/2020   Procedure: COLONOSCOPY WITH PROPOFOL;  Surgeon: Yetta Flock, MD;  Location: [place];  Service: Gastroenterology;  Laterality: N/A;   ENDOSCOPIC MUCOSAL RESECTION N/A 02/22/2021   Procedure: ENDOSCOPIC MUCOSAL RESECTION;  Surgeon: Rush Landmark Telford Nab., MD;  Location: [place];  Service: Gastroenterology;  Laterality: N/A;   ESOPHAGOGASTRODUODENOSCOPY (EGD) WITH PROPOFOL Left 11/14/2020   Procedure: ESOPHAGOGASTRODUODENOSCOPY (EGD) WITH PROPOFOL;  Surgeon: Yetta Flock, MD;  Location: [place];  Service: Gastroenterology;  Laterality: Left;   ESOPHAGOGASTRODUODENOSCOPY (EGD) WITH PROPOFOL N/A 02/22/2021   Procedure: ESOPHAGOGASTRODUODENOSCOPY (EGD) WITH PROPOFOL;  Surgeon: Rush Landmark Telford Nab., MD;  Location: [place];  Service: Gastroenterology;  Laterality: N/A;   FISTULA SUPERFICIALIZATION Left 01/21/2020   Procedure: FISTULA SUPERFICIALIZATION OF FISTULA;  Surgeon: [name], MD;  Location: [place];  Service: Vascular;  Laterality: Left;   FISTULA SUPERFICIALIZATION Left 05/03/6788   Procedure: PLICATION OF LEFT UPPER EXTREMITY ARTERIOVENOUS FISTULA;  Surgeon: Waynetta [name], MD;  Location: [place];  Service: Vascular;  Laterality: Left;   GIVENS CAPSULE STUDY N/A 11/16/2020   Procedure: GIVENS CAPSULE STUDY;  Surgeon: Yetta Flock, MD;  Location: [place];  Service: Gastroenterology;  Laterality: N/A;   HEMOSTASIS CLIP PLACEMENT  02/22/2021   Procedure: HEMOSTASIS CLIP PLACEMENT;  Surgeon: Rush Landmark Telford Nab., MD;  Location: [place];  Service:  Gastroenterology;;   INSERTION OF DIALYSIS CATHETER Right 11/04/2019   Procedure: CONVERT TEMPORARY DIALYSIS CATHETER TO TUNNELED DIALYSIS CATHETER Right Internal Jugular.;  Surgeon: [name], MD;  Location: [place];  Service: Vascular;  Laterality: Right;   IR BONE TUMOR(S)RF ABLATION  07/23/2021   IR KYPHO LUMBAR INC FX REDUCE BONE BX UNI/BIL CANNULATION INC/IMAGING  07/23/2021   IR THORACENTESIS ASP PLEURAL SPACE W/IMG GUIDE  08/17/2020   IR THORACENTESIS ASP PLEURAL SPACE W/IMG GUIDE  07/20/2021   MULTIPLE TOOTH EXTRACTIONS     POLYPECTOMY  11/16/2020   Procedure: POLYPECTOMY;  Surgeon: Yetta Flock, MD;  Location: [place];  Service: Gastroenterology;;   RADIOLOGY WITH ANESTHESIA N/A 06/19/2021   Procedure: MRI WITH ANESTHESIA  ,LIVER WITH AND WITHOUT  CONTRAST;  Surgeon: Radiologist, Medication, MD;  Location: [place];  Service: Radiology;  Laterality: N/A;   RADIOLOGY WITH ANESTHESIA N/A 07/20/2021   Procedure: MRI WITH ANESTHESIA LOWER SPINE WITHOUT CONTRAST;  Surgeon: Radiologist, Medication, MD;  Location: [place];  Service: Radiology;  Laterality: N/A;   REVISION OF ARTERIOVENOUS GORETEX GRAFT Left 01/21/2020   Procedure: REVISION OF ARTERIOVENOUS FISTULA WITH SIDE BRANCH LIGATION;  Surgeon: [name], MD;  Location: [place];  Service: Vascular;  Laterality: Left;   SUBMUCOSAL LIFTING INJECTION  02/22/2021   Procedure: SUBMUCOSAL LIFTING INJECTION;  Surgeon: Irving Copas., MD;  Location: [place];  Service: Gastroenterology;;   SUBMUCOSAL TATTOO INJECTION  02/22/2021   Procedure: SUBMUCOSAL TATTOO INJECTION;  Surgeon: Irving Copas., MD;  Location: [place];  Service: Gastroenterology;;   TEE WITHOUT CARDIOVERSION N/A 10/26/2019   Procedure: TRANSESOPHAGEAL ECHOCARDIOGRAM (TEE);  Surgeon: [name], MD;  Location: [facility] ENDOSCOPY;  Service: Cardiovascular;  Laterality: N/A;   THORACENTESIS     x 2   THORACENTESIS N/A 11/07/2020    Procedure: Mathews Robinsons;  Surgeon: [name], MD;  Location: [facility] ENDOSCOPY;  Service: Pulmonary;  Laterality: N/A;   THORACENTESIS N/A 04/06/2021   Procedure: THORACENTESIS;  Surgeon: Freddi Starr, MD;  Location: [facility] ENDOSCOPY;  Service: Pulmonary;  Laterality: N/A;   UPPER ESOPHAGEAL ENDOSCOPIC ULTRASOUND (EUS) N/A 02/22/2021   Procedure: UPPER ESOPHAGEAL ENDOSCOPIC ULTRASOUND (EUS);  Surgeon: Irving Copas., MD;  Location: [place];  Service: Gastroenterology;  Laterality: N/A;    Family History: Family History  Problem Relation Age of Onset   Seizures Father    CAD Father    Diabetes Sister    Lupus Sister    Hypertension Sister    Colon cancer Neg Hx    Esophageal cancer Neg Hx    Pancreatic cancer Neg Hx    Stomach cancer Neg Hx    Inflammatory bowel disease Neg Hx    Liver disease Neg Hx    Rectal cancer Neg Hx     Social History: Social History   Socioeconomic History   Marital status: Widowed    Spouse name: Not on file   Number of children: Not on file   Years of education: Not on file   Highest education level: Not on file  Occupational History   Not on file  Tobacco Use   Smoking status: Former    Types: Cigarettes   Smokeless tobacco: Never  Vaping Use   Vaping Use: Never used  Substance and Sexual Activity   Alcohol use: Not Currently   Drug use: Not Currently   Sexual activity: Not Currently  Other Topics Concern   Not on file  Social History Narrative   Not on file   Social Determinants of Health   Financial Resource Strain: Low Risk    Difficulty of Paying Living Expenses: Not very hard  Food Insecurity: No Food Insecurity   Worried About Running Out of Food in the Last Year: Never true   Ran Out of Food in the Last Year: Never true  Transportation Needs: No Transportation Needs   Lack of Transportation (Medical): No   Lack of Transportation (Non-Medical): No  Physical Activity: Not on file  Stress: Not on file   Social Connections: Socially Isolated   Frequency of Communication with Friends and Family: Three times a week   Frequency of Social Gatherings with Friends and Family: Three times a week   Attends Religious Services: Never   Active Member  of Clubs or Organizations: No   Attends Archivist Meetings: Never   Marital Status: Widowed    Allergies:  Allergies  Allergen Reactions   Black Walnut Flavor Hives   Shellfish Allergy Itching    Makes throat itch     Objective:    Vital Signs:   Temp:  [98.2 F (36.8 C)-99.3 F (37.4 C)] 98.5 F (36.9 C) (10/14 0755) Pulse Rate:  [38-119] 76 (10/14 0615) Resp:  [14-22] 18 (10/14 0755) BP: (68-158)/(37-113) 158/113 (10/14 0755) SpO2:  [97 %-100 %] 100 % (10/14 0755) Weight:  [89.9 kg-90.9 kg] 90.9 kg (10/14 0755) Last BM Date: 09/06/21  Weight change: Filed Weights   09/06/21 0420 09/07/21 0304 09/07/21 0755  Weight: 90.3 kg 89.9 kg 90.9 kg    Intake/Output:   Intake/Output Summary (Last 24 hours) at 09/07/2021 0827 Last data filed at 09/07/2021 0526 Gross per 24 hour  Intake 1055.97 ml  Output 0 ml  Net 1055.97 ml      Physical Exam    General:  Well appearing. No resp difficulty HEENT: normal Neck: supple. JVP 10-12 cm. Carotids 2+ bilat; no bruits. No lymphadenopathy or thyromegaly appreciated. Cor: PMI nondisplaced. Mildly tachy, irregular rate & rhythm. 2/6 HSM LLSB Lungs: Decreased right base.  Abdomen: soft, nontender, nondistended. No hepatosplenomegaly. No bruits or masses. Good bowel sounds. Extremities: no cyanosis, clubbing, rash, edema Neuro: alert & orientedx3, cranial nerves grossly intact. moves all 4 extremities w/o difficulty. Affect pleasant   Telemetry   Atrial fibrillation rate 100s. Personally reviewed)  EKG    Atrial fibrillation (personally reviewed)  Labs   Basic Metabolic Panel: Recent Labs  Lab 09/05/21 0721 09/06/21 0113 09/07/21 0414  NA 132* 131* 130*  K 3.6 3.7  4.1  CL 92* 93* 92*  CO2 25 25 22   GLUCOSE 84 96 103*  BUN 39* 54* 81*  CREATININE 5.09* 5.54* 6.65*  CALCIUM 8.8* 8.4* 8.3*    Liver Function Tests: Recent Labs  Lab 09/05/21 0721  AST 144*  ALT 49*  ALKPHOS 485*  BILITOT 2.4*  PROT 6.1*  ALBUMIN 2.9*   No results for input(s): LIPASE, AMYLASE in the last 168 hours. No results for input(s): AMMONIA in the last 168 hours.  CBC: Recent Labs  Lab 09/05/21 0721 09/06/21 0113 09/07/21 0414  WBC 6.9 8.2 6.5  HGB 9.9* 9.0* 9.1*  HCT 32.3* 28.3* 28.4*  MCV 102.5* 99.3 98.6  PLT 120* 124* 127*    Cardiac Enzymes: No results for input(s): CKTOTAL, CKMB, CKMBINDEX, TROPONINI in the last 168 hours.  BNP: BNP (last 3 results) No results for input(s): BNP in the last 8760 hours.  ProBNP (last 3 results) No results for input(s): PROBNP in the last 8760 hours.   CBG: No results for input(s): GLUCAP in the last 168 hours.  Coagulation Studies: No results for input(s): LABPROT, INR in the last 72 hours.   Imaging   No results found.   Medications:     Current Medications:  (feeding supplement) PROSource Plus  30 mL Oral BID BM   allopurinol  300 mg Oral QHS   apixaban  5 mg Oral BID   atorvastatin  80 mg Oral QHS   cinacalcet  30 mg Oral Q supper   ferric citrate  420 mg Oral TID WC   ferrous sulfate  324 mg Oral Daily   gabapentin  100 mg Oral TID   mouth rinse  15 mL Mouth Rinse BID   metoprolol succinate  12.5 mg Oral BID   multivitamin  1 tablet Oral QHS   polyethylene glycol  17 g Oral Daily   sodium chloride flush  3 mL Intravenous Q12H    Infusions:  sodium chloride     sodium chloride       Assessment/Plan   1. Acute on chronic systolic CHF:  Nonischemic cardiomyopathy, cath in [place] in 9/20 with no significant coronary disease.  Echo in 11/20 with EF 20-25% with dilated and severely dysfunctional RV with D-shaped interventricular septum.  MR appeared moderate and TR appeared moderate. With  dilated RV, V/Q scan was done and was negative. Biventricular failure.  Last echo in [place] per report showed EF 40-45%.  Cause of fall in EF uncertain, could be due to atrial fibrillation with RVR (tachycardia-mediated). Myocarditis, potentially from COVID-19, is possible.  She is now HD-dependent for volume control.  Most recent echo in 12/21 showed EF 35-40%.  She is volume overloaded on exam currently, hard to get adequate fluid off her with HD due to soft BP.   - Now that she is off amiodarone, need to restart Toprol XL 25 mg bid for rate control.  - Add midodrine 5 mg tid to facilitate fluid off at HD.  - Will arrange for repeat echo.  2. Valvular heart disease: Patient was supposed to have mitral and tricuspid valve replacements in 11/20 in [place].  TEE in 9/20 in [place] showed mod-severe MR and severe TR.  TEE on 10/26/19 showed mod-severe TR and moderate MR. Echo in 5/21 showed moderate MR and moderate TR.  Echo in 12/21 showed moderate-severe TR and mild MR. Her main issue seems to be biventricular failure and would hold off on valve procedures.     - Repeat echo this admission, has murmur of TR.  3. Atrial fibrillation: Chronic per outpatient notes. Per daughter, she has had cardioversions in the past. She failed DCCV on amiodarone on 10/26/19. I think she is unlikely to go back into NSR at this point.  Admitted with afib/RVR in setting of chest pain.  Now off amiodarone gtt with HR 100s.  - Restart home Toprol XL.   - Continue Eliquis 5 mg bid.  4. ESRD: Needs more fluid off.  - Add midodrine to facilitate.  5. Chest pain: HS-TnI mildly elevated with no trend.  Chest pain was reproducible with chest wall palpation, now improved.  Suspect musculoskeletal.  Elevated HS-TnI consistent with volume overload/ESRD (demand ischemia).   6. Duodenal carcinoid: S/p prior resection.  7. Breast cancer: New diagnosis with mets to bone, liver, and lungs.  Getting palliative radiation as outpatient.  -  Agree with palliative care evaluation, note plans for family meeting.   Length of Stay: 2  [name], MD  09/07/2021, 8:27 AM  Advanced Heart Failure Team Pager [phone] (M-F; 7a - 5p)  Please contact [facility] for night-coverage after hours (4p -7a ) and weekends on [email or web address]

## 2021-09-07 NOTE — Progress Notes (Signed)
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 fib, ESRD on HD MWF, HTN/HLD, chronic systolic CHF sent from dialysis center due to chest pain shortness of breath.   In the ED underwent work-up found to have A. fib with RVR placed on Cardizem, BP dropped to 70s switched to amiodarone.  CTA chest shows increased number and size of pulmonary nodules likely metastatic disease, mets to right lateral fourth rib, CT abdomen shows increasing number and size of liver lesions consistent with worsening liver metastatic disease, malignant ascites, increasing pleural effusion.  The patient was admitted and cardiology nephrology hematology and palliative consulted. Seen by oncology and has advised palliative and comfort based care at this time given patient's worsening metastatic disease.  Subjective: Seen this morning in dialysis she has been hypotensive in 60s to 70s and albumin and fluids is being administered.  Patient is alert awake interactive.    Assessment & Plan:  Atrial fibrillation with RVR:  amio has been discontinued.  Rate uncontrolled.  Cardiology following.On Eliquis.  Toprol if BP allows.  Poorly differentiated adenocarcinoma most consistent with breast cancer with metastasis : Patient has worsening metastatic pulmonary nodules, liver lesion, osseous lesion, also ascites and pleural effusion most likely malignant. Was on palliative radiation of her back.  I had discussion Dr. Lorenso Courier has advised no further treatment and comfort/palliative based care   Goals of care: Currently DNR prognosis appears very poor and hospice care advised discussed oncology and having family meeting with  palliative care today   ESRD on dialysis MWF nephrology on board-dialysis today but again having hypotension.  Needs more fluid off but hypotensive will be limiting factor. 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  Recurrent moderate right pleural effusion: Previous history of thoracentesis in August.She is on 3l [place] home setting-denies shortness of breath.  Daughter has declined thoracentesis and ordered but I am not sure if she will be stable for procedure if remains persistently hypotensive.  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  HLD:On Lipitor.  Mild thrombocytopenia monitor  Hyponatremia as per HD  Class II Obesity:Patient's Body mass index is 35.5 kg/m. : Will benefit with PCP follow-up, weight loss healthy lifestyle and outpatient sleep evaluation  DVT prophylaxis:   Apixaban Code Status:   Code Status: DNR Family Communication: plan of care discussed with patient at bedside.  I had discussed with patient's daughter over the phone 10/13 overall poor prognosis, family meeting today and likely transition to hospice.  Status is: Inpatient Remains inpatient appropriate because:Unsafe d/c plan and Inpatient level of care appropriate due to severity of illness Dispo: The patient is from: Home              Anticipated d/c is to:  TBD              Patient currently is not medically stable to d/c.   Difficult to place patient No  Objective: Vitals: Today's  Vitals   09/07/21 0930 09/07/21 1000 09/07/21 1030 09/07/21 1100  BP: (!) 67/46 (!) 79/42 (!) 61/47 (!) 95/40  Pulse: 90 (!)  138    Resp: 15 17    Temp:      TempSrc:      SpO2: 100% 100%    Weight:      Height:      PainSc:       Physical Examination: General exam: AA oriented, not in distress weak and frail.Older than stated age HEENT:Oral mucosa moist, Ear/Nose WNL grossly, dentition normal. Respiratory system: bilaterally diminished,no use of accessory muscle Cardiovascular system: S1 & S2 +, No JVD,. Gastrointestinal system: Abdomen soft,full, NT,ND, BS+ Nervous System:Alert, awake, moving extremities and grossly nonfocal Extremities: no edema, distal peripheral pulses palpable.  Skin: No rashes,no icterus. MSK: Normal muscle bulk,tone, power  Medications reviewed:  Scheduled Meds:  (feeding supplement) PROSource Plus  30 mL Oral BID BM   allopurinol  300 mg Oral QHS   apixaban  5 mg Oral BID   atorvastatin  80 mg Oral QHS   cinacalcet  30 mg Oral Q supper   ferric citrate  420 mg Oral TID WC   ferrous sulfate  324 mg Oral Daily   gabapentin  100 mg Oral TID   mouth rinse  15 mL Mouth Rinse BID   metoprolol succinate  25 mg Oral BID   midodrine  10 mg Oral Once   midodrine  5 mg Oral TID WC   multivitamin  1 tablet Oral QHS   polyethylene glycol  17 g Oral Daily   sodium chloride flush  3 mL Intravenous Q12H   Continuous Infusions:  sodium chloride     sodium chloride     Diet Order             Diet Heart Room service appropriate? Yes; Fluid consistency: Thin  Diet effective now                 Intake/Output  Intake/Output Summary (Last 24 hours) at 09/07/2021 1111 Last data filed at 09/07/2021 0526 Gross per 24 hour  Intake 815.97 ml  Output 0 ml  Net 815.97 ml    Intake/Output from previous day: 10/13 0701 - 10/14 0700 In: 6144 [P.O.:578; I.V.:350.7; IV Piggyback:127.2] Out: 0  Net IO Since Admission: 2,513.52 mL [09/07/21 1111]   Weight change: -0.6 kg  Wt Readings from Last 3 Encounters:  09/07/21 90.9 kg  07/30/21 93.3 kg  07/09/21 90.7 kg      Consultants:see note  Procedures:see note Antimicrobials: Anti-infectives (From admission, onward)    None      Culture/Microbiology    Component Value Date/Time   SDES PLEURAL 11/18/2020 1312   SPECREQUEST NONE 11/18/2020 1312   CULT  11/18/2020 1312    NO GROWTH 5 DAYS Performed at [facility] [address] [place], [place] [address]    REPTSTATUS 11/23/2020 FINAL 11/18/2020 1312    Other culture-see note  Unresulted Labs (From admission, onward)     Start     Ordered   09/08/21 0500  Phosphorus  Tomorrow morning,   R       Question:  Specimen collection method  Answer:  Lab=Lab collect   09/07/21 0856   09/07/21 0867  Basic metabolic panel  Daily,   R     Question:  Specimen collection method  Answer:  Lab=Lab collect   09/06/21 1054   09/07/21 0500  CBC  Daily,  R     Question:  Specimen collection method  Answer:  Lab=Lab collect   09/06/21 1054          data Reviewed: I have personally reviewed following labs and imaging studies CBC: Recent Labs  Lab 09/05/21 0721 09/06/21 0113 09/07/21 0414  WBC 6.9 8.2 6.5  HGB 9.9* 9.0* 9.1*  HCT 32.3* 28.3* 28.4*  MCV 102.5* 99.3 98.6  PLT 120* 124* 127*    Basic Metabolic Panel: Recent Labs  Lab 09/05/21 0721 09/06/21 0113 09/07/21 0414  NA 132* 131* 130*  K 3.6 3.7 4.1  CL 92* 93* 92*  CO2 25 25 22   GLUCOSE 84 96 103*  BUN 39* 54* 81*  CREATININE 5.09* 5.54* 6.65*  CALCIUM 8.8* 8.4* 8.3*    GFR: Estimated Creatinine Clearance: 7.9 mL/min (A) (by C-G formula based on SCr of 6.65 mg/dL (H)). Liver Function Tests: Recent Labs  Lab 09/05/21 0721  AST 144*  ALT 49*  ALKPHOS 485*  BILITOT 2.4*  PROT 6.1*  ALBUMIN 2.9*    No results for input(s): LIPASE, AMYLASE in the last 168 hours. No results for input(s): AMMONIA in the last 168 hours. Coagulation Profile: No results for input(s): INR, PROTIME in the last 168 hours. Cardiac Enzymes: No results for input(s): CKTOTAL, CKMB,  CKMBINDEX, TROPONINI in the last 168 hours. BNP (last 3 results) No results for input(s): PROBNP in the last 8760 hours. HbA1C: No results for input(s): HGBA1C in the last 72 hours. CBG: No results for input(s): GLUCAP in the last 168 hours. Lipid Profile: No results for input(s): CHOL, HDL, LDLCALC, TRIG, CHOLHDL, LDLDIRECT in the last 72 hours. Thyroid Function Tests: No results for input(s): TSH, T4TOTAL, FREET4, T3FREE, THYROIDAB in the last 72 hours. Anemia Panel: Recent Labs    09/07/21 0414  FERRITIN 4,229*  TIBC 137*  IRON 110   Sepsis Labs: No results for input(s): PROCALCITON, LATICACIDVEN in the last 168 hours.  Recent Results (from the past 240 hour(s))  Resp Panel by RT-PCR (Flu A&B, Covid) Nasopharyngeal Swab     Status: None   Collection Time: 09/05/21  9:30 AM   Specimen: Nasopharyngeal Swab; Nasopharyngeal(NP) swabs in vial transport medium  Result Value Ref Range Status   SARS Coronavirus 2 by RT PCR NEGATIVE NEGATIVE Final    Comment: (NOTE) SARS-CoV-2 target nucleic acids are NOT DETECTED.  The SARS-CoV-2 RNA is generally detectable in upper respiratory specimens during the acute phase of infection. The lowest concentration of SARS-CoV-2 viral copies this assay can detect is 138 copies/mL. A negative result does not preclude SARS-Cov-2 infection and should not be used as the sole basis for treatment or other patient management decisions. A negative result may occur with  improper specimen collection/handling, submission of specimen other than nasopharyngeal swab, presence of viral mutation(s) within the areas targeted by this assay, and inadequate number of viral copies(<138 copies/mL). A negative result must be combined with clinical observations, patient history, and epidemiological information. The expected result is Negative.  Fact Sheet for Patients:  EntrepreneurPulse.com.au  Fact Sheet for Healthcare Providers:   IncredibleEmployment.be  This test is no t yet approved or cleared by the [place] FDA and  has been authorized for detection and/or diagnosis of SARS-CoV-2 by FDA under an Emergency Use Authorization (EUA). This EUA will remain  in effect (meaning this test can be used) for the duration of the COVID-19 declaration under Section 564(b)(1) of the Act, 21 U.S.C.section 360bbb-3(b)(1), unless the authorization is terminated  or revoked sooner.       Influenza A by PCR NEGATIVE NEGATIVE Final   Influenza B by PCR NEGATIVE NEGATIVE Final    Comment: (NOTE) The Xpert Xpress SARS-CoV-2/FLU/RSV plus assay is intended as an aid in the diagnosis of influenza from Nasopharyngeal swab specimens and should not be used as a sole basis for treatment. Nasal washings and aspirates are unacceptable for Xpert Xpress SARS-CoV-2/FLU/RSV testing.  Fact Sheet for Patients: EntrepreneurPulse.com.au  Fact Sheet for Healthcare Providers: IncredibleEmployment.be  This test is not yet approved or cleared by the [place] FDA and has been authorized for detection and/or diagnosis of SARS-CoV-2 by FDA under an Emergency Use Authorization (EUA). This EUA will remain in effect (meaning this test can be used) for the duration of the COVID-19 declaration under Section 564(b)(1) of the Act, 21 U.S.C. section 360bbb-3(b)(1), unless the authorization is terminated or revoked.  Performed at [facility], [place] [address], [place], [place] [address]       Radiology Studies: No results found.   LOS: 2 days   Antonieta Pert, MD [facility]  09/07/2021, 11:11 AM

## 2021-09-07 NOTE — Plan of Care (Signed)

## 2021-09-07 NOTE — Progress Notes (Signed)
   09/07/21 0400 09/07/21 0420  Vitals  BP (!) 72/40 (!) 68/52  MAP (mmHg) (!) 49 (!) 59  Pulse Rate 91 91  MEWS COLOR  MEWS Score Color Yellow Yellow  Oxygen Therapy  SpO2 99 % 99 %  O2 Device Nasal Cannula Nasal Cannula  O2 Flow Rate (L/min) 3 L/min 3 L/min  MEWS Score  MEWS Temp 0 0  MEWS Systolic 2 3  MEWS Pulse 0 0  MEWS RR 0 0  MEWS LOC 0 0  MEWS Score 2 3    Provider notified , orders received.

## 2021-09-07 NOTE — Progress Notes (Signed)
[facility]) Hospital Liaison Note  Received request from Transitions of Care Manager(Cortlin Polo, LCSWA and Osborne Oman, NP with PMT) for family interest in [facility]. Met and spoke with patient and family to confirm interest and explain services. Family agreeable to transfer today. Transitions of Care Manger aware. RN please call report to [phone] prior to patient leaving the unit. Please send singed and completed DNR with patient at discharge. Thank you.   Roselee Nova [facility] Liaison [phone]

## 2021-09-07 NOTE — Progress Notes (Signed)
Palliative Medicine Inpatient Follow Up Note     Chart Reviewed. Patient assessed at the bedside.   I met initially at the bedside with patient's family (daughter, son-in-law, and son).  Extensive discussion regarding goals of care and patient's current quality of life.  Daughter is tearful as she describes noticeable changes in her mother's ability to care for herself in addition to her overall health.  She does not wish for her mother to suffer with understanding of poor prognosis.  I reviewed extensively patient's medical conditions, reviewing lab work, vital signs, imaging, and input from other team members/providers.  Education provided on best case versus worst-case scenario.  We discussed at length what comfort focused care would look like including discontinuation of dialysis and other medications not focused on comfort/symptom management.  Patient's son Marchia Bond seemed withdrawn during conversation.  Observed coloring on his phone.  Family states this is his way of coping with such difficult conversations.  All family mutually agrees focusing on patient's comfort is most appropriate as they do not wish for her to continue to suffer and knows that her body is weak and would not be able to sustain continued medical interventions including dialysis or further work-up regarding her suspected metastatic breast cancer.  Family would like to speak with patient as she is alert and oriented and gain insight on what her wishes are prior to making final decisions.  Updates received during discussions with family patient was significantly hypotensive in dialysis.  Daughter and myself went to dialysis to see patient to allow daughter an opportunity to discuss with her mother about her specific wishes.  RN also provided updates regarding treatment.  Per RN patient is finishing up treatment and they are preparing to bring her back upstairs.  1220: Patient has arrived back into her room and all  family is at the bedside.  We continued goals of care discussion involving patient.  Detailed updates provided to patient regarding her current illness and comorbidities.  Ms. Coghill is tearful expressing although she does not want to die she knows that her body is slowly "given out" and she has excepted this.  She states her only wishes to be able to see her grandson who lives in the home with she and her daughter (his mother passed away 1 year ago yesterday).  We discussed discontinuation of dialysis, comfort focused care, and hospice support versus continued treatment.  Patient states she is at peace and has lived "her best life" and is prepared for when her time comes.  Ms. Ola feels her family she wishes to focus solely on her comfort however is hopeful she does not have to pass away in the hospital.  Education provided on what comfort focused care while hospitalized will look like in addition to outpatient hospice in the home versus residential hospice.  Patient and family verbalized understanding with request for [facility] consideration.  Education provided on referral process.  Patient and family have mutually agreed to transition all care to focus on comfort with understanding all medical interventions, hemodialysis, and medications not focused on symptom management/comfort will be discontinued.  Education and recommendations provided to family to consider counseling and kids path for patient's 22-year-old grandson who was recently lost his grandfather, mother, and soon to lose his grandmother.  Questions addressed and support provided.    Objective Assessment: Vital Signs Vitals:   09/07/21 1030 09/07/21 1100  BP: (!) 61/47 (!) 95/40  Pulse:    Resp:    Temp:  SpO2:      Intake/Output Summary (Last 24 hours) at 09/07/2021 1242 Last data filed at 09/07/2021 8675 Gross per 24 hour  Intake 815.97 ml  Output 0 ml  Net 815.97 ml   Last Weight  Most recent update: 09/07/2021   8:01 AM    Weight  90.9 kg (200 lb 6.4 oz)            Gen:  NAD, frail, chronically ill appearing CV: Irregular, a-fibrillation, hypotensive  PULM: diminished bilaterally, congested cough  ABD: soft/tender/mildly distended/normal bowel sounds Neuro: Alert and oriented x3, mood appropriate   SUMMARY OF RECOMMENDATIONS   DNR/DNI After long discussion with patient and family they have all mutually agreed to transition all care to focus on comfort.  Patient has requested to discontinue hemodialysis.  No further escalation in care, no further work-up. Will discontinue all medications/medical interventions not comfort focused Education regarding patient's overall poor prognosis, what comfort focused care would look like, hospice goals and philosophy, and expectations at end of life. Fentanyl PRN for pain/air hunger/comfort Robinul PRN for excessive secretions Ativan PRN for agitation/anxiety Zofran PRN for nausea Liquifilm tears PRN for dry eyes May have comfort feeding Comfort cart for family Unrestricted visitations in the setting of EOL (per policy) Oxygen PRN 2L or less for comfort. No escalation.   TOC/HF referral for hospice home (family has requested [employer] [facility]).  She is currently active with [facility].  Secure chat sent to CSW, Dr. Lupita Leash, Dr. Lorenso Courier, and Dr. Augustin Coupe.  PMT will continue to support and follow on as needed basis. Please secure chat for urgent needs.   Time Total: 75 min.   Visit consisted of counseling and education dealing with the complex and emotionally intense issues of symptom management and palliative care in the setting of serious and potentially life-threatening illness.Greater than 50%  of this time was spent counseling and coordinating care related to the above assessment and plan.  Alda Lea, AGPCNP-BC  Palliative Medicine Team [phone]  Palliative Medicine Team providers are available by phone from 7am to 7pm daily and can be  reached through the team cell phone. Should this patient require assistance outside of these hours, please call the patient's attending physician.

## 2021-09-07 NOTE — Progress Notes (Signed)
MD notified about pt's loose black stools.

## 2021-09-07 NOTE — Discharge Summary (Signed)
Physician Discharge Summary  Melissa Adams JIR:[id number] DOB: 09-Apr-1946 DOA: 09/05/2021  PCP: [name], NP  Admit date: 09/05/2021 Discharge date: 09/07/2021  Admitted From: home Disposition:  [place]  Recommendations for Outpatient Follow-up:  Hospice facility  Home Health:no  Equipment/Devices: none  Discharge Condition: Stable Code Status:   Code Status: DNR Diet recommendation:  Diet Order             Diet Heart Room service appropriate? 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 fib, ESRD on HD MWF, HTN/HLD, chronic systolic CHF sent from dialysis center due to chest pain shortness of breath.   In the ED underwent work-up found to have A. fib with RVR placed on Cardizem, BP dropped to 70s switched to amiodarone.  CTA chest shows increased number and size of pulmonary nodules likely metastatic disease, mets to right lateral fourth rib, CT abdomen shows increasing number and size of liver lesions consistent with worsening liver metastatic disease, malignant ascites, increasing pleural effusion.  The patient was admitted and cardiology nephrology hematology and palliative consulted. Seen by oncology and has advised palliative and comfort based care at this time given patient's worsening metastatic disease. She is accepted at [facility].  Discharge Diagnoses:   Goals of care/End of Life care: given worsening metastatic disease, persistent hypotension esrd on HD- Patient and daughter has decided for comfort measure inpatient hospice. She will be going once bed available today  Atrial fibrillation with RVR Poorly differentiated adenocarcinoma most consistent with breast cancer with metastasis-Patient has worsening metastatic pulmonary nodules,  liver lesion, osseous lesion, also ascites and pleural effusion most likely malignant. Was on palliative radiation of her back.  I had discussion Dr. Lorenso Courier has advised no further treatment and comfort/palliative based care.    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 HLD: Mild thrombocytopenia monitor Hyponatremia Class II Obesity  Consults: Cardiology Nephrology PMT  Subjective: ALERT, AWAKE , NOT IN DISTRESS.  Discharge Exam: Vitals:   09/07/21 1100 09/07/21 1115  BP: (!) 95/40 (!) 88/53  Pulse:  (!) 123  Resp:  19  Temp:  98 F (36.7 C)  SpO2:  100%   General: Pt is alert, awake, not in acute distress Cardiovascular: RRR, S1/S2 +, no rubs, no gallops Respiratory: CTA bilaterally, no wheezing, no rhonchi Abdominal: Soft, NT, ND, bowel sounds + Extremities: no edema, no cyanosis.  Discharge Instructions   Allergies as of 09/07/2021       Reactions   Black Walnut Flavor Hives   Shellfish Allergy Itching   Makes throat itch         Medication List     STOP taking these medications    (feeding supplement) PROSource Plus liquid   acetaminophen 325 MG tablet Commonly known as: TYLENOL   albuterol 108 (90 Base) MCG/ACT inhaler Commonly known as: VENTOLIN HFA   allopurinol 300 MG tablet Commonly known as: ZYLOPRIM   apixaban 5 MG Tabs tablet Commonly known as: Eliquis   atorvastatin 80 MG tablet Commonly known as: LIPITOR  cetirizine 10 MG tablet Commonly known as: ZYRTEC   Dialyvite 800-Zinc 15 0.8 MG Tabs   ferric citrate 1 GM 210 MG(Fe) tablet Commonly known as: AURYXIA   ferrous sulfate 324 MG Tbec   furosemide 40 MG tablet Commonly known as: Lasix    gabapentin 100 MG capsule Commonly known as: NEURONTIN   lidocaine-prilocaine cream Commonly known as: EMLA   metoprolol tartrate 25 MG tablet Commonly known as: LOPRESSOR   montelukast 10 MG tablet Commonly known as: SINGULAIR   multivitamin Tabs tablet   polyethylene glycol 17 g packet Commonly known as: MIRALAX / GLYCOLAX   triamcinolone 0.1% oint-Eucerin equivalent cream 1:1 mixture   vitamin B-12 1000 MCG tablet Commonly known as: CYANOCOBALAMIN       TAKE these medications    antiseptic oral rinse Liqd Apply 15 mLs topically as needed for dry mouth.   camphor-menthol lotion Commonly known as: SARNA Apply 1 application topically every 8 (eight) hours as needed for itching.   glycopyrrolate 0.2 MG/ML injection Commonly known as: ROBINUL Inject 1.5 mLs (0.3 mg total) into the vein every 4 (four) hours as needed (excessive secretions).   hydrOXYzine 25 MG tablet Commonly known as: ATARAX/VISTARIL Take 1 tablet (25 mg total) by mouth every 8 (eight) hours as needed for itching.   polyvinyl alcohol 1.4 % ophthalmic solution Commonly known as: LIQUIFILM TEARS Place 1 drop into both eyes 4 (four) times daily as needed for dry eyes.   senna-docusate 8.6-50 MG tablet Commonly known as: Senokot-S Take 1 tablet by mouth 2 (two) times daily as needed for mild constipation or moderate constipation.        Allergies  Allergen Reactions   Black Walnut Flavor Hives   Shellfish Allergy Itching    Makes throat itch     The results of significant diagnostics from this hospitalization (including imaging, microbiology, ancillary and laboratory) are listed below for reference.    Microbiology: Recent Results (from the past 240 hour(s))  Resp Panel by RT-PCR (Flu A&B, Covid) Nasopharyngeal Swab     Status: None   Collection Time: 09/05/21  9:30 AM   Specimen: Nasopharyngeal Swab; Nasopharyngeal(NP) swabs in vial transport medium  Result Value Ref Range Status    SARS Coronavirus 2 by RT PCR NEGATIVE NEGATIVE Final    Comment: (NOTE) SARS-CoV-2 target nucleic acids are NOT DETECTED.  The SARS-CoV-2 RNA is generally detectable in upper respiratory specimens during the acute phase of infection. The lowest concentration of SARS-CoV-2 viral copies this assay can detect is 138 copies/mL. A negative result does not preclude SARS-Cov-2 infection and should not be used as the sole basis for treatment or other patient management decisions. A negative result may occur with  improper specimen collection/handling, submission of specimen other than nasopharyngeal swab, presence of viral mutation(s) within the areas targeted by this assay, and inadequate number of viral copies(<138 copies/mL). A negative result must be combined with clinical observations, patient history, and epidemiological information. The expected result is Negative.  Fact Sheet for Patients:  EntrepreneurPulse.com.au  Fact Sheet for Healthcare Providers:  IncredibleEmployment.be  This test is no t yet approved or cleared by the [place] FDA and  has been authorized for detection and/or diagnosis of SARS-CoV-2 by FDA under an Emergency Use Authorization (EUA). This EUA will remain  in effect (meaning this test can be used) for the duration of the COVID-19 declaration under Section 564(b)(1) of the Act, 21 U.S.C.section 360bbb-3(b)(1), unless the authorization is terminated  or revoked sooner.  Influenza A by PCR NEGATIVE NEGATIVE Final   Influenza B by PCR NEGATIVE NEGATIVE Final    Comment: (NOTE) The Xpert Xpress SARS-CoV-2/FLU/RSV plus assay is intended as an aid in the diagnosis of influenza from Nasopharyngeal swab specimens and should not be used as a sole basis for treatment. Nasal washings and aspirates are unacceptable for Xpert Xpress SARS-CoV-2/FLU/RSV testing.  Fact Sheet for  Patients: EntrepreneurPulse.com.au  Fact Sheet for Healthcare Providers: IncredibleEmployment.be  This test is not yet approved or cleared by the [place] FDA and has been authorized for detection and/or diagnosis of SARS-CoV-2 by FDA under an Emergency Use Authorization (EUA). This EUA will remain in effect (meaning this test can be used) for the duration of the COVID-19 declaration under Section 564(b)(1) of the Act, 21 U.S.C. section 360bbb-3(b)(1), unless the authorization is terminated or revoked.  Performed at [facility], [place] [address] [place], [place] [address]     Procedures/Studies: DG Chest 2 View  Result Date: 09/05/2021 CLINICAL DATA:  Chest and back pain with shortness of breath. EXAM: CHEST - 2 VIEW COMPARISON:  Chest x-ray 07/20/2021 FINDINGS: The heart is enlarged but appears stable. Stable tortuosity and calcification of the thoracic aorta. New moderate-sized right-sided pleural effusion with overlying atelectasis. The left lung remains relatively clear. IMPRESSION: New moderate-sized right pleural effusion with overlying atelectasis. Stable cardiac enlargement. Electronically Signed   By: Marijo Sanes M.D.   On: 09/05/2021 07:51   CT Angio Chest PE W and/or Wo Contrast  Result Date: 09/05/2021 CLINICAL DATA:  PE suspected. High probability. History of carcinoid tumor. EXAM: CT ANGIOGRAPHY CHEST WITH CONTRAST TECHNIQUE: Multidetector CT imaging of the chest was performed using the standard protocol during bolus administration of intravenous contrast. Multiplanar CT image reconstructions and MIPs were obtained to evaluate the vascular anatomy. CONTRAST:  32m OMNIPAQUE IOHEXOL 350 MG/ML SOLN COMPARISON:  Chest radiograph concurrent.  CT chest, 07/06/2021. FINDINGS: Cardiovascular: Satisfactory opacification of the pulmonary arteries to the segmental level. No evidence of pulmonary embolism. Cardiomegaly, with RIGHT heart  enlargement. No pericardial effusion. Mediastinum/Nodes: No enlarged mediastinal, hilar, or axillary lymph nodes. Thyroid gland, trachea, and esophagus demonstrate no significant findings. Lungs/Pleura: Increased number and size of multiple pulmonary nodules, since recent 08/22 comparison. Persistent small-to-moderate volume of RIGHT pleural effusion, with adjacent and dependent atelectasis. No pneumothorax. Upper Abdomen: No acute abnormality within the imaged portion. Musculoskeletal: Coarse RIGHT breast calcification is unchanged. The RIGHT axilla adenopathy, with biopsy clip at the dominant node. Lytic osseous lesion at the RIGHT lateral fourth rib, with nondisplaced pathologic fracture. Additional chronic bilateral rib deformities. Degenerative changes of the imaged thoracic spine, including multilevel anterior osteophytes. Review of the MIP images confirms the above findings. IMPRESSION: 1. No segmental or larger pulmonary embolus. 2. Persistent moderate volume RIGHT pleural effusion with atelectasis. 3. Increased number and size of multifocal pulmonary nodules since August 22, with findings favored metastatic. 4. RIGHT lateral fourth rib lytic lesion with nondisplaced pathologic fracture. 5. Cardiomegaly with RIGHT heart enlargement. Additional chronic and senescent changes, as above. Electronically Signed   By: JMichaelle BirksM.D.   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 CONTRAST:  873mOMNIPAQUE IOHEXOL 350 MG/ML SOLN COMPARISON:  07/06/2021 FINDINGS: Lower chest:  Moderate to large right pleural effusion associated atelectasis or consolidation. Cardiomegaly. Hepatobiliary: Numerous liver  lesions of varying sizes, increased in size and number compared to prior examination, largest almost entirely replacing the left lobe measuring 8.4 x 6.5 cm, previously 3.5 x 3.5 cm when measured similarly (series 6, image 20). Additional index lesion of the posterior right lobe of the liver measures 2.9 x 2.7 cm, previously 1.7 x 1.7 cm (series 6, image 26). Tiny gallstones and or sludge in the dependent gallbladder. No gallbladder wall thickening. No biliary dilatation. Pancreas: Unremarkable. No pancreatic ductal dilatation or surrounding inflammatory changes. Spleen: Normal in size without significant abnormality. Adrenals/Urinary Tract: Benign, fatty attenuation left adrenal nodule (series 5, image 7). Kidneys are normal, without renal calculi, solid lesion, or hydronephrosis. Bladder is unremarkable. Stomach/Bowel: Stomach is within normal limits. Appendix appears normal. No evidence of bowel wall thickening, distention, or inflammatory changes. Vascular/Lymphatic: Aortic atherosclerosis. No enlarged abdominal or pelvic lymph nodes. Reproductive: Status post hysterectomy. Other: Anasarca. Small volume ascites throughout the abdomen and pelvis, increased compared to prior examination. Some evidence of peritoneal thickening in the pelvis (series 6, image 71). Musculoskeletal: Diffusely lytic osseous metastatic disease throughout, status post vertebral cement augmentation of L4. IMPRESSION: 1. Numerous liver lesions of varying sizes, increased in size and number compared to prior examination, consistent with worsened hepatic metastatic disease. 2. Small volume ascites throughout the abdomen and pelvis, increased compared to prior examination. Some evidence of peritoneal thickening in the pelvis. 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 3. Diffusely lytic osseous metastatic disease throughout, status post vertebral cement augmentation of L4. 4. Moderate to large right  pleural effusion associated atelectasis or consolidation, incompletely imaged. 5. Cardiomegaly. Aortic Atherosclerosis (ICD10-I70.0). Electronically Signed   By: Delanna Ahmadi M.D.   On: 09/05/2021 10:37   US BREAST LTD UNI RIGHT INC AXILLA  Result Date: 08/18/2021 CLINICAL DATA:  75 year old female with newly diagnosed metastatic disease within the liver and lumbar spine, with recent pathologic lumbar spine fracture and kyphoplasty. Possible RIGHT breast mass, RIGHT breast skin thickening and enlarged RIGHT axillary lymph nodes identified on 07/06/2021 CT. History of duodenal carcinoid. EXAM: DIGITAL DIAGNOSTIC BILATERAL MAMMOGRAM WITH TOMOSYNTHESIS AND CAD; ULTRASOUND RIGHT BREAST LIMITED TECHNIQUE: Bilateral digital diagnostic mammography and breast tomosynthesis was performed. Only limited CC views could be obtained due to the patient's back pain and inability to position. The images were evaluated with computer-aided detection. COMPARISON:  07/06/2021 CT. 06/23/2018 and prior outside mammograms. ACR Breast Density Category c: The breast tissue is heterogeneously dense, which may obscure small masses. FINDINGS: Full field CC views of both breasts demonstrate asymmetry within the OUTER RIGHT breast. Diffuse RIGHT breast skin thickening is present. No suspicious LEFT breast findings identified. On physical exam, a hard fixed mass within the central/OUTER RIGHT breast identified. Targeted ultrasound is performed, showing a 3.1 x 2.7 x 5.3 cm irregular hypoechoic mass centered at the 9 o'clock position of the RIGHT breast 2 cm from the nipple. RIGHT breast skin thickening is identified. Two RIGHT axillary lymph nodes with cortical thickening are noted. IMPRESSION: 1. 5.3 cm OUTER RIGHT breast mass and 2 abnormal RIGHT axillary lymph nodes, highly suspicious for breast malignancy and lymph node metastases. RIGHT breast skin thickening suspicious for skin involvement. 2. No suspicious LEFT breast findings on  limited mammogram as described above. RECOMMENDATION: Ultrasound-guided biopsies of the OUTER RIGHT breast mass and 1 of the abnormal RIGHT axillary lymph nodes, which will be scheduled. Consider RIGHT breast  skin punch biopsy to determine skin involvement, as clinically indicated. I have discussed the findings and recommendations with the patient. If applicable, a reminder letter will be sent to the patient regarding the next appointment. BI-RADS CATEGORY  5: Highly suggestive of malignancy. Electronically Signed   By: Margarette Canada M.D.   On: 08/18/2021 08:59  MM DIAG BREAST TOMO BILATERAL  Result Date: 08/18/2021 CLINICAL DATA:  75 year old female with newly diagnosed metastatic disease within the liver and lumbar spine, with recent pathologic lumbar spine fracture and kyphoplasty. Possible RIGHT breast mass, RIGHT breast skin thickening and enlarged RIGHT axillary lymph nodes identified on 07/06/2021 CT. History of duodenal carcinoid. EXAM: DIGITAL DIAGNOSTIC BILATERAL MAMMOGRAM WITH TOMOSYNTHESIS AND CAD; ULTRASOUND RIGHT BREAST LIMITED TECHNIQUE: Bilateral digital diagnostic mammography and breast tomosynthesis was performed. Only limited CC views could be obtained due to the patient's back pain and inability to position. The images were evaluated with computer-aided detection. COMPARISON:  07/06/2021 CT. 06/23/2018 and prior outside mammograms. ACR Breast Density Category c: The breast tissue is heterogeneously dense, which may obscure small masses. FINDINGS: Full field CC views of both breasts demonstrate asymmetry within the OUTER RIGHT breast. Diffuse RIGHT breast skin thickening is present. No suspicious LEFT breast findings identified. On physical exam, a hard fixed mass within the central/OUTER RIGHT breast identified. Targeted ultrasound is performed, showing a 3.1 x 2.7 x 5.3 cm irregular hypoechoic mass centered at the 9 o'clock position of the RIGHT breast 2 cm from the nipple. RIGHT breast skin  thickening is identified. Two RIGHT axillary lymph nodes with cortical thickening are noted. IMPRESSION: 1. 5.3 cm OUTER RIGHT breast mass and 2 abnormal RIGHT axillary lymph nodes, highly suspicious for breast malignancy and lymph node metastases. RIGHT breast skin thickening suspicious for skin involvement. 2. No suspicious LEFT breast findings on limited mammogram as described above. RECOMMENDATION: Ultrasound-guided biopsies of the OUTER RIGHT breast mass and 1 of the abnormal RIGHT axillary lymph nodes, which will be scheduled. Consider RIGHT breast skin punch biopsy to determine skin involvement, as clinically indicated. I have discussed the findings and recommendations with the patient. If applicable, a reminder letter will be sent to the patient regarding the next appointment. BI-RADS CATEGORY  5: Highly suggestive of malignancy. Electronically Signed   By: Margarette Canada M.D.   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 This was found to be concordant by Dr. Nolon Nations. Pathology revealed INVASIVE MAMMARY CARCINOMA, NO NODAL TISSUE IDENTIFIED of the RIGHT axilla, (tribell clip). This was found to be concordant by Dr. Nolon Nations. Pathology results were discussed with the patient's daughter, Delmer Islam by telephone, per request. Ms. Mateo Flow reported her mother did well after the biopsies with tenderness at the sites. Post biopsy instructions and care were reviewed and questions were answered. The patient's daughter was encouraged to call [facility] for any additional concerns. My direct phone number was provided. Surgical consultation has been arranged with Dr. Autumn Messing at [facility] on September 06, 2021. Dr. Narda Rutherford and Dr. Kyung Rudd at [facility] were notified of biopsy results via EPIC message on August 29, 2021. Pathology results reported by Terie Purser, RN on 08/30/2021. Electronically Signed   By: Nolon Nations M.D.   On: 08/30/2021 14:56   Result Date: 08/30/2021  CLINICAL DATA:  Patient presents for ultrasound-guided core biopsy of mass in the RIGHT breast and enlarged RIGHT axillary lymph node. EXAM: [place] AXILLARY NODE CORE BIOPSY RIGHT U/S RIGHT BREAST CORE BIOPSY COMPARISON:  Previous exam(s). PROCEDURE: I met with the patient and we discussed the procedure of ultrasound-guided biopsy, including benefits and alternatives. We discussed the high likelihood of a successful procedure. We discussed the risks of the procedure, including infection, bleeding, tissue injury, clip migration, and inadequate sampling. Informed written consent was given. The usual time-out protocol was performed immediately prior to the procedure. Site 1: 9 o'clock location RIGHT breast. Lesion quadrant: LATERAL RIGHT breast, ribbon clip clip Using sterile technique and 1% lidocaine as local anesthetic, under direct ultrasound visualization, a 12 gauge spring-loaded device was used to perform biopsy of mass in the 9 o'clock location of the RIGHT breast using a inferior to superior approach. At the conclusion of the procedure ribbon shaped tissue marker clip was deployed into the biopsy cavity. Site 2: RIGHT axilla.  Lesion quadrant: RIGHT axilla, tri bell clip Using sterile technique and 1% lidocaine as local anesthetic, under direct ultrasound visualization, a 14 gauge spring-loaded device was used to perform biopsy of enlarged RIGHT axillary lymph using a LATERAL to MEDIAL approach. At the conclusion of the procedure tri bell tissue marker clip was deployed into the biopsy cavity. Follow-up 2-view mammogram was performed and dictated separately. IMPRESSION: Ultrasound guided biopsy of RIGHT breast and RIGHT axilla. No apparent complications. Electronically Signed: By:  Nolon Nations M.D. On: 08/28/2021 10:53  MM CLIP PLACEMENT RIGHT  Result Date: 08/28/2021 CLINICAL DATA:  Status post ultrasound-guided core biopsy RIGHT breast mass and enlarged RIGHT axillary lymph. EXAM: 3D DIAGNOSTIC RIGHT MAMMOGRAM POST ULTRASOUND BIOPSY x2 COMPARISON:  Previous exam(s). FINDINGS: 3D Mammographic images were obtained following ultrasound guided biopsy of mass in the 9 o'clock location of the RIGHT breast and placement of a ribbon shaped clip. The biopsy marking clip is in expected position at the site of biopsy. Following biopsy of RIGHT axillary lymph node, a tri bell clip was placed. Patient has limited mobility, and the clip in the RIGHT axilla is not imaged. IMPRESSION: Appropriate positioning of the ribbon shaped biopsy marking clip at the site of biopsy in the LATERAL portion of the RIGHT breast. RIGHT axillary clip is not imaged. Final Assessment: Post Procedure Mammograms for Marker Placement Electronically Signed   By: Nolon Nations M.D.   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 This was found to be concordant by Dr. Nolon Nations. Pathology revealed INVASIVE MAMMARY CARCINOMA, NO NODAL TISSUE IDENTIFIED of the RIGHT axilla, (tribell clip). This was found to be concordant by Dr. Nolon Nations. Pathology results were discussed with the patient's daughter, Delmer Islam by telephone, per request. Ms. Mateo Flow reported her mother did well after the biopsies with tenderness at the sites. Post biopsy instructions and care were reviewed and questions were answered. The patient's daughter was encouraged to call [facility] for any additional concerns. My direct phone number was provided. Surgical consultation has been arranged with  Dr. Autumn Messing at [facility] on September 06, 2021. Dr. Narda Rutherford and [facility]. Kyung Rudd at [facility] were notified of biopsy results via EPIC message on August 29, 2021. Pathology results reported by Terie Purser, RN on 08/30/2021. Electronically Signed   By: Nolon Nations M.D.   On: 08/30/2021 14:56   Result Date: 08/30/2021 CLINICAL DATA:  Patient presents for ultrasound-guided core biopsy of mass in the RIGHT breast and enlarged RIGHT axillary lymph node. EXAM: [place] AXILLARY NODE CORE BIOPSY RIGHT U/S RIGHT BREAST CORE BIOPSY COMPARISON:  Previous exam(s). PROCEDURE: I met with the patient and we discussed the procedure of ultrasound-guided biopsy, including benefits and alternatives. We discussed the high likelihood of a successful procedure. We discussed the risks of the procedure, including infection, bleeding, tissue injury, clip migration, and inadequate sampling. Informed written consent was given. The usual time-out protocol was performed immediately prior to the procedure. Site 1: 9 o'clock location RIGHT breast. Lesion quadrant: LATERAL RIGHT breast, ribbon clip clip Using sterile technique and 1% lidocaine as local anesthetic, under direct ultrasound visualization, a 12 gauge spring-loaded device was used to perform biopsy of mass in the 9 o'clock location of the RIGHT breast using a inferior to superior approach. At the conclusion of the procedure ribbon shaped tissue marker clip was deployed into the biopsy cavity. Site 2: RIGHT axilla.  Lesion quadrant: RIGHT axilla, tri bell clip Using sterile technique and 1% lidocaine as local anesthetic, under direct ultrasound visualization, a 14 gauge spring-loaded device was used to perform biopsy of enlarged RIGHT axillary lymph using a LATERAL to MEDIAL approach. At the conclusion of the procedure tri bell tissue marker clip was deployed into the biopsy cavity. Follow-up 2-view mammogram was performed and dictated separately.  IMPRESSION: Ultrasound guided biopsy of RIGHT breast and RIGHT axilla. No apparent complications. Electronically Signed: By: Nolon Nations M.D. On: 08/28/2021 10:53  VAS [place] LOWER EXTREMITY VENOUS (DVT) (7a-7p)  Result Date: 09/05/2021  Lower Venous DVT Study Patient Name:  KEONDRIA SIEVER [facility]  Date of Exam:   09/05/2021 Medical Rec #: [id number]          Accession #:    [id number] Date of Birth: 27-Oct-1946         Patient Gender: F Patient Age:   62 years Exam Location:  [facility] Procedure:      VAS [place] LOWER EXTREMITY VENOUS (DVT) Referring Phys: JULIE HAVILAND --------------------------------------------------------------------------------  Indications: Edema.  Comparison Study: 10-13-2019 Prior bilateral lower extremity venous was negative                   for DVT. Performing Technologist: Darlin Coco RDMS, RVT  Examination Guidelines: A complete evaluation includes B-mode imaging, spectral Doppler, color Doppler, and power Doppler as needed of all accessible portions of each vessel. Bilateral testing is considered an integral part of a complete examination. Limited examinations for reoccurring indications may be performed as noted. The reflux portion of the exam is performed with the patient in reverse Trendelenburg.  +-----+---------------+---------+-----------+----------+-----------------------+ RIGHTCompressibilityPhasicitySpontaneityPropertiesThrombus Aging          +-----+---------------+---------+-----------+----------+-----------------------+ CFV                                               Unable to access due to  patient positioning     +-----+---------------+---------+-----------+----------+-----------------------+   +---------+---------------+---------+-----------+----------+--------------+ LEFT     CompressibilityPhasicitySpontaneityPropertiesThrombus Aging  +---------+---------------+---------+-----------+----------+--------------+ CFV      Full           Yes      Yes                                 +---------+---------------+---------+-----------+----------+--------------+ SFJ      Full                                                        +---------+---------------+---------+-----------+----------+--------------+ FV Prox  Full                                                        +---------+---------------+---------+-----------+----------+--------------+ FV Mid   Full                                                        +---------+---------------+---------+-----------+----------+--------------+ FV DistalFull                                                        +---------+---------------+---------+-----------+----------+--------------+ PFV      Full                                                        +---------+---------------+---------+-----------+----------+--------------+ POP      Full           Yes      Yes                                 +---------+---------------+---------+-----------+----------+--------------+ PTV      Full                                                        +---------+---------------+---------+-----------+----------+--------------+ PERO     Full                                                        +---------+---------------+---------+-----------+----------+--------------+    Summary: LEFT: - There is no evidence of deep vein thrombosis in the lower extremity.  - No cystic structure found in the popliteal fossa.  *See table(s) above for measurements and observations. Electronically  signed by Harold Barban MD on 09/05/2021 at 9:27:00 PM.    Final     Labs: BNP (last 3 results) No results for input(s): BNP in the last 8760 hours. Basic Metabolic Panel: Recent Labs  Lab 09/05/21 0721 09/06/21 0113 09/07/21 0414  NA 132* 131* 130*  K 3.6 3.7 4.1  CL 92* 93*  92*  CO2 '25 25 22  ' GLUCOSE 84 96 103*  BUN 39* 54* 81*  CREATININE 5.09* 5.54* 6.65*  CALCIUM 8.8* 8.4* 8.3*   Liver Function Tests: Recent Labs  Lab 09/05/21 0721  AST 144*  ALT 49*  ALKPHOS 485*  BILITOT 2.4*  PROT 6.1*  ALBUMIN 2.9*   No results for input(s): LIPASE, AMYLASE in the last 168 hours. No results for input(s): AMMONIA in the last 168 hours. CBC: Recent Labs  Lab 09/05/21 0721 09/06/21 0113 09/07/21 0414  WBC 6.9 8.2 6.5  HGB 9.9* 9.0* 9.1*  HCT 32.3* 28.3* 28.4*  MCV 102.5* 99.3 98.6  PLT 120* 124* 127*   Cardiac Enzymes: No results for input(s): CKTOTAL, CKMB, CKMBINDEX, TROPONINI in the last 168 hours. BNP: Invalid input(s): POCBNP CBG: No results for input(s): GLUCAP in the last 168 hours. D-Dimer No results for input(s): DDIMER in the last 72 hours. Hgb A1c No results for input(s): HGBA1C in the last 72 hours. Lipid Profile No results for input(s): CHOL, HDL, LDLCALC, TRIG, CHOLHDL, LDLDIRECT in the last 72 hours. Thyroid function studies No results for input(s): TSH, T4TOTAL, T3FREE, THYROIDAB in the last 72 hours.  Invalid input(s): FREET3 Anemia work up Recent Labs    09/07/21 0414  FERRITIN 4,229*  TIBC 137*  IRON 110   Urinalysis    Component Value Date/Time   COLORURINE YELLOW 10/12/2019 1723   APPEARANCEUR HAZY (A) 10/12/2019 1723   LABSPEC 1.009 10/12/2019 1723   PHURINE 5.0 10/12/2019 1723   GLUCOSEU NEGATIVE 10/12/2019 1723   HGBUR SMALL (A) 10/12/2019 1723   BILIRUBINUR NEGATIVE 10/12/2019 1723   KETONESUR NEGATIVE 10/12/2019 1723   PROTEINUR NEGATIVE 10/12/2019 1723   UROBILINOGEN 0.2 06/09/2019 1735   NITRITE NEGATIVE 10/12/2019 1723   LEUKOCYTESUR NEGATIVE 10/12/2019 1723   Sepsis Labs Invalid input(s): PROCALCITONIN,  WBC,  LACTICIDVEN Microbiology Recent Results (from the past 240 hour(s))  Resp Panel by RT-PCR (Flu A&B, Covid) Nasopharyngeal Swab     Status: None   Collection Time: 09/05/21  9:30 AM    Specimen: Nasopharyngeal Swab; Nasopharyngeal(NP) swabs in vial transport medium  Result Value Ref Range Status   SARS Coronavirus 2 by RT PCR NEGATIVE NEGATIVE Final    Comment: (NOTE) SARS-CoV-2 target nucleic acids are NOT DETECTED.  The SARS-CoV-2 RNA is generally detectable in upper respiratory specimens during the acute phase of infection. The lowest concentration of SARS-CoV-2 viral copies this assay can detect is 138 copies/mL. A negative result does not preclude SARS-Cov-2 infection and should not be used as the sole basis for treatment or other patient management decisions. A negative result may occur with  improper specimen collection/handling, submission of specimen other than nasopharyngeal swab, presence of viral mutation(s) within the areas targeted by this assay, and inadequate number of viral copies(<138 copies/mL). A negative result must be combined with clinical observations, patient history, and epidemiological information. The expected result is Negative.  Fact Sheet for Patients:  EntrepreneurPulse.com.au  Fact Sheet for Healthcare Providers:  IncredibleEmployment.be  This test is no t yet approved or cleared by the [place] FDA and  has been authorized for detection and/or  diagnosis of SARS-CoV-2 by FDA under an Emergency Use Authorization (EUA). This EUA will remain  in effect (meaning this test can be used) for the duration of the COVID-19 declaration under Section 564(b)(1) of the Act, 21 U.S.C.section 360bbb-3(b)(1), unless the authorization is terminated  or revoked sooner.       Influenza A by PCR NEGATIVE NEGATIVE Final   Influenza B by PCR NEGATIVE NEGATIVE Final    Comment: (NOTE) The Xpert Xpress SARS-CoV-2/FLU/RSV plus assay is intended as an aid in the diagnosis of influenza from Nasopharyngeal swab specimens and should not be used as a sole basis for treatment. Nasal washings and aspirates are  unacceptable for Xpert Xpress SARS-CoV-2/FLU/RSV testing.  Fact Sheet for Patients: EntrepreneurPulse.com.au  Fact Sheet for Healthcare Providers: IncredibleEmployment.be  This test is not yet approved or cleared by the [place] FDA and has been authorized for detection and/or diagnosis of SARS-CoV-2 by FDA under an Emergency Use Authorization (EUA). This EUA will remain in effect (meaning this test can be used) for the duration of the COVID-19 declaration under Section 564(b)(1) of the Act, 21 U.S.C. section 360bbb-3(b)(1), unless the authorization is terminated or revoked.  Performed at [facility], [place] [address] [place], [place] [address]      Time coordinating discharge: 35 minutes  SIGNED: Antonieta Pert, MD  [facility] 09/07/2021, 4:20 PM  If 7PM-7AM, please contact night-coverage [email or web address]

## 2021-09-07 NOTE — Plan of Care (Signed)
  Problem: Education: Goal: Knowledge of General Education information will improve Description: Including pain rating scale, medication(s)/side effects and non-pharmacologic comfort measures 09/07/2021 1715 by Augusto Garbe, RN Outcome: Adequate for Discharge 09/07/2021 1546 by Augusto Garbe, RN Outcome: Progressing   Problem: Health Behavior/Discharge Planning: Goal: Ability to manage health-related needs will improve 09/07/2021 1715 by Augusto Garbe, RN Outcome: Adequate for Discharge 09/07/2021 1546 by Augusto Garbe, RN Outcome: Progressing   Problem: Clinical Measurements: Goal: Ability to maintain clinical measurements within normal limits will improve 09/07/2021 1715 by Augusto Garbe, RN Outcome: Adequate for Discharge 09/07/2021 1546 by Augusto Garbe, RN Outcome: Progressing Goal: Will remain free from infection 09/07/2021 1715 by Augusto Garbe, RN Outcome: Adequate for Discharge 09/07/2021 1546 by Augusto Garbe, RN Outcome: Progressing Goal: Diagnostic test results will improve 09/07/2021 1715 by Augusto Garbe, RN Outcome: Adequate for Discharge 09/07/2021 1546 by Augusto Garbe, RN Outcome: Progressing Goal: Respiratory complications will improve 09/07/2021 1715 by Augusto Garbe, RN Outcome: Adequate for Discharge 09/07/2021 1546 by Augusto Garbe, RN Outcome: Progressing Goal: Cardiovascular complication will be avoided 09/07/2021 1715 by Augusto Garbe, RN Outcome: Adequate for Discharge 09/07/2021 1546 by Augusto Garbe, RN Outcome: Progressing   Problem: Activity: Goal: Risk for activity intolerance will decrease 09/07/2021 1715 by Augusto Garbe, RN Outcome: Adequate for Discharge 09/07/2021 1546 by Augusto Garbe, RN Outcome: Progressing   Problem: Nutrition: Goal: Adequate nutrition will be maintained 09/07/2021 1715 by Augusto Garbe, RN Outcome: Adequate for Discharge 09/07/2021 1546 by Augusto Garbe, RN Outcome: Progressing   Problem: Coping: Goal: Level of anxiety will decrease 09/07/2021 1715 by Augusto Garbe,  RN Outcome: Adequate for Discharge 09/07/2021 1546 by Augusto Garbe, RN Outcome: Progressing   Problem: Elimination: Goal: Will not experience complications related to bowel motility 09/07/2021 1715 by Augusto Garbe, RN Outcome: Adequate for Discharge 09/07/2021 1546 by Augusto Garbe, RN Outcome: Progressing Goal: Will not experience complications related to urinary retention 09/07/2021 1715 by Augusto Garbe, RN Outcome: Adequate for Discharge 09/07/2021 1546 by Augusto Garbe, RN Outcome: Progressing   Problem: Pain Managment: Goal: General experience of comfort will improve 09/07/2021 1715 by Augusto Garbe, RN Outcome: Adequate for Discharge 09/07/2021 1546 by Augusto Garbe, RN Outcome: Progressing   Problem: Safety: Goal: Ability to remain free from injury will improve 09/07/2021 1715 by Augusto Garbe, RN Outcome: Adequate for Discharge 09/07/2021 1546 by Augusto Garbe, RN Outcome: Progressing   Problem: Skin Integrity: Goal: Risk for impaired skin integrity will decrease 09/07/2021 1715 by Augusto Garbe, RN Outcome: Adequate for Discharge 09/07/2021 1546 by Augusto Garbe, RN Outcome: Progressing

## 2021-09-07 NOTE — Progress Notes (Addendum)
Patient had a large BM, while cleaning patient RN noticed blood and a blood clot that was wiped from either the vaginal or rectal area. Please assess as needed.

## 2021-09-10 NOTE — Progress Notes (Signed)
                                                                                                                                                             Patient Name: Melissa Adams MRN: [id number] DOB: 1945-12-18 Referring Physician: Terrilee Croak Date of Service: 09/04/2021 [facility], [place]                                                        End Of Treatment Note  Diagnoses: C79.51-Secondary malignant neoplasm of bone  Cancer Staging: Metastatic unknown primary, favor clinical diagnosis of a metastatic breast cancer.   Intent: Palliative  Radiation Treatment Dates: 08/22/2021 through 09/04/2021 Site Technique Total Dose (Gy) Dose per Fx (Gy) Completed Fx Beam Energies  Lumbar Spine: Spine_L4 Complex 30/30 3 10/10 15X   Narrative: The patient tolerated radiation therapy relatively well. She did have fatigue and persistent back pain at the conclusion of radiation.  Plan: The patient will receive a call in about one month from the radiation oncology department. She will continue follow up with Dr. Lorenso Courier as well.   ________________________________________________    Carola Rhine, [facility]

## 2021-09-13 ENCOUNTER — Ambulatory Visit: Payer: Medicare Other | Admitting: Pulmonary Disease

## 2021-09-25 DEATH — deceased

## 2021-09-27 ENCOUNTER — Ambulatory Visit (HOSPITAL_COMMUNITY): Payer: Medicaid Other

## 2021-09-27 ENCOUNTER — Encounter (HOSPITAL_COMMUNITY): Payer: Medicare Other | Admitting: Cardiology

## 2021-12-19 IMAGING — DX DG CHEST 2V
2 series · 2 of 2 positions shown · non-contrast
Comparison: 04/06/2021

CLINICAL DATA: Shortness of breath.  Possible lesion.

EXAM:
CHEST - 2 VIEW

[x chest ap]
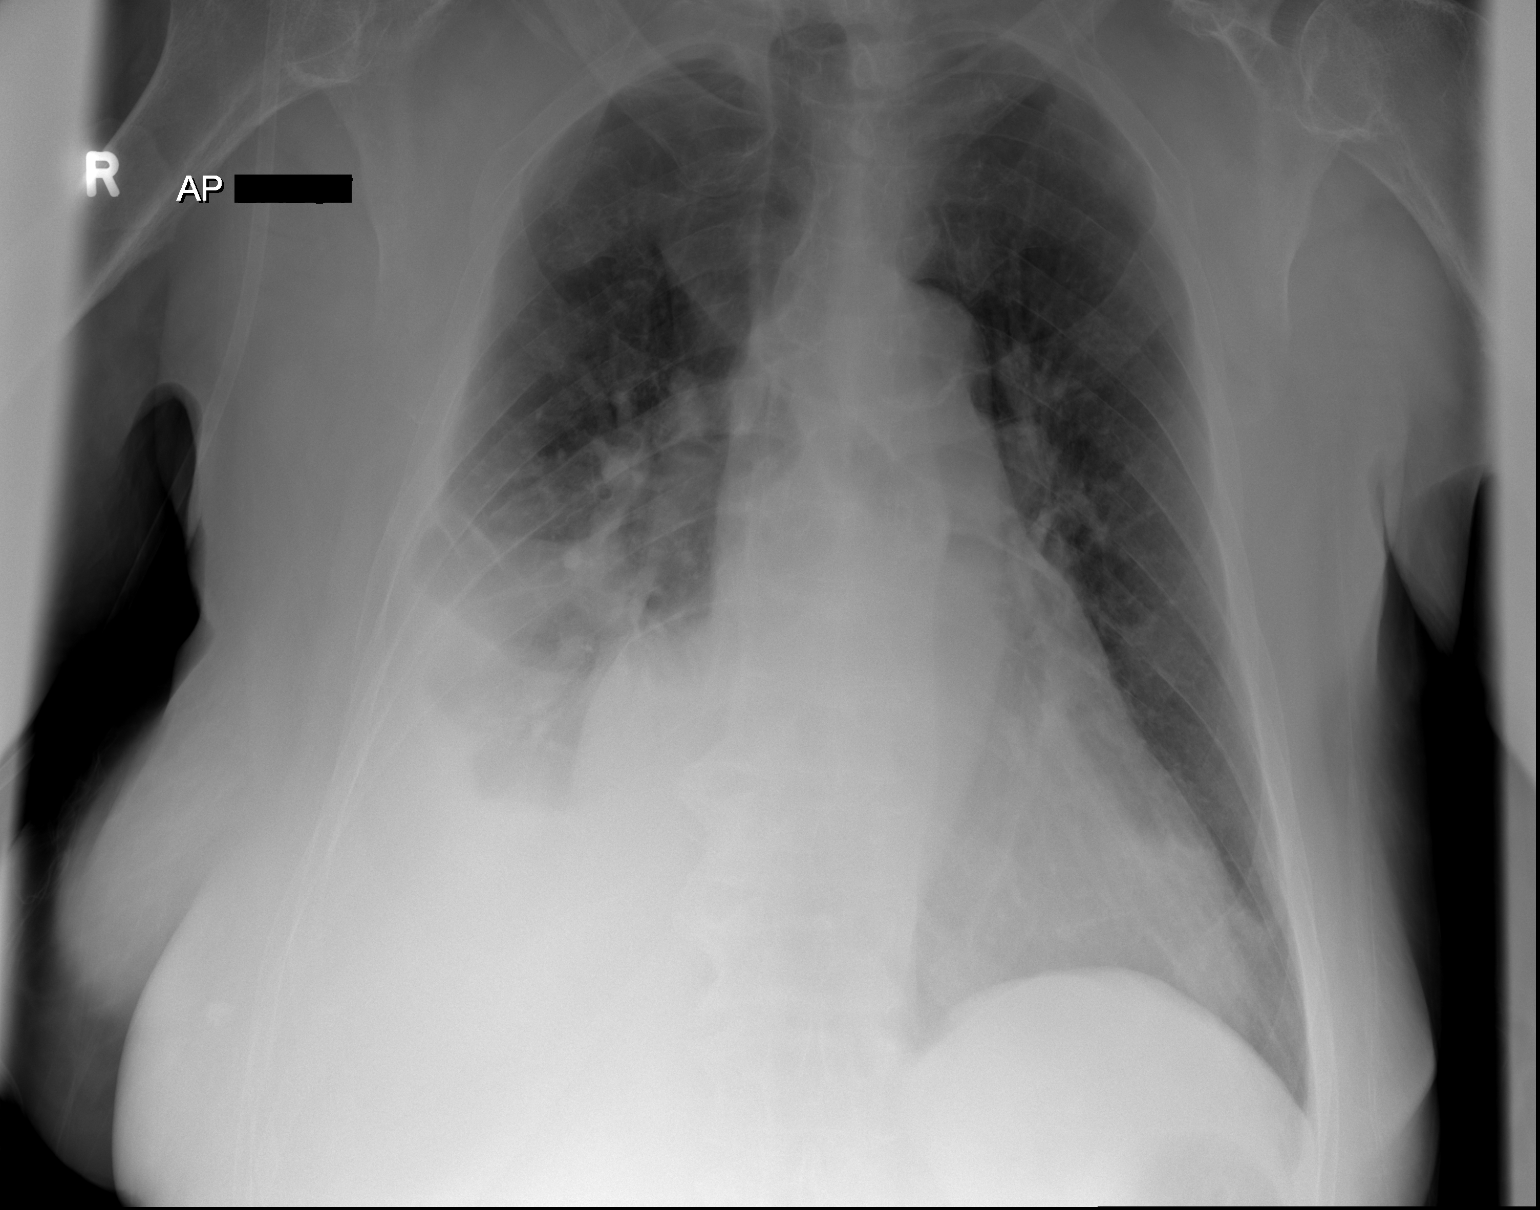

[w chest lat]
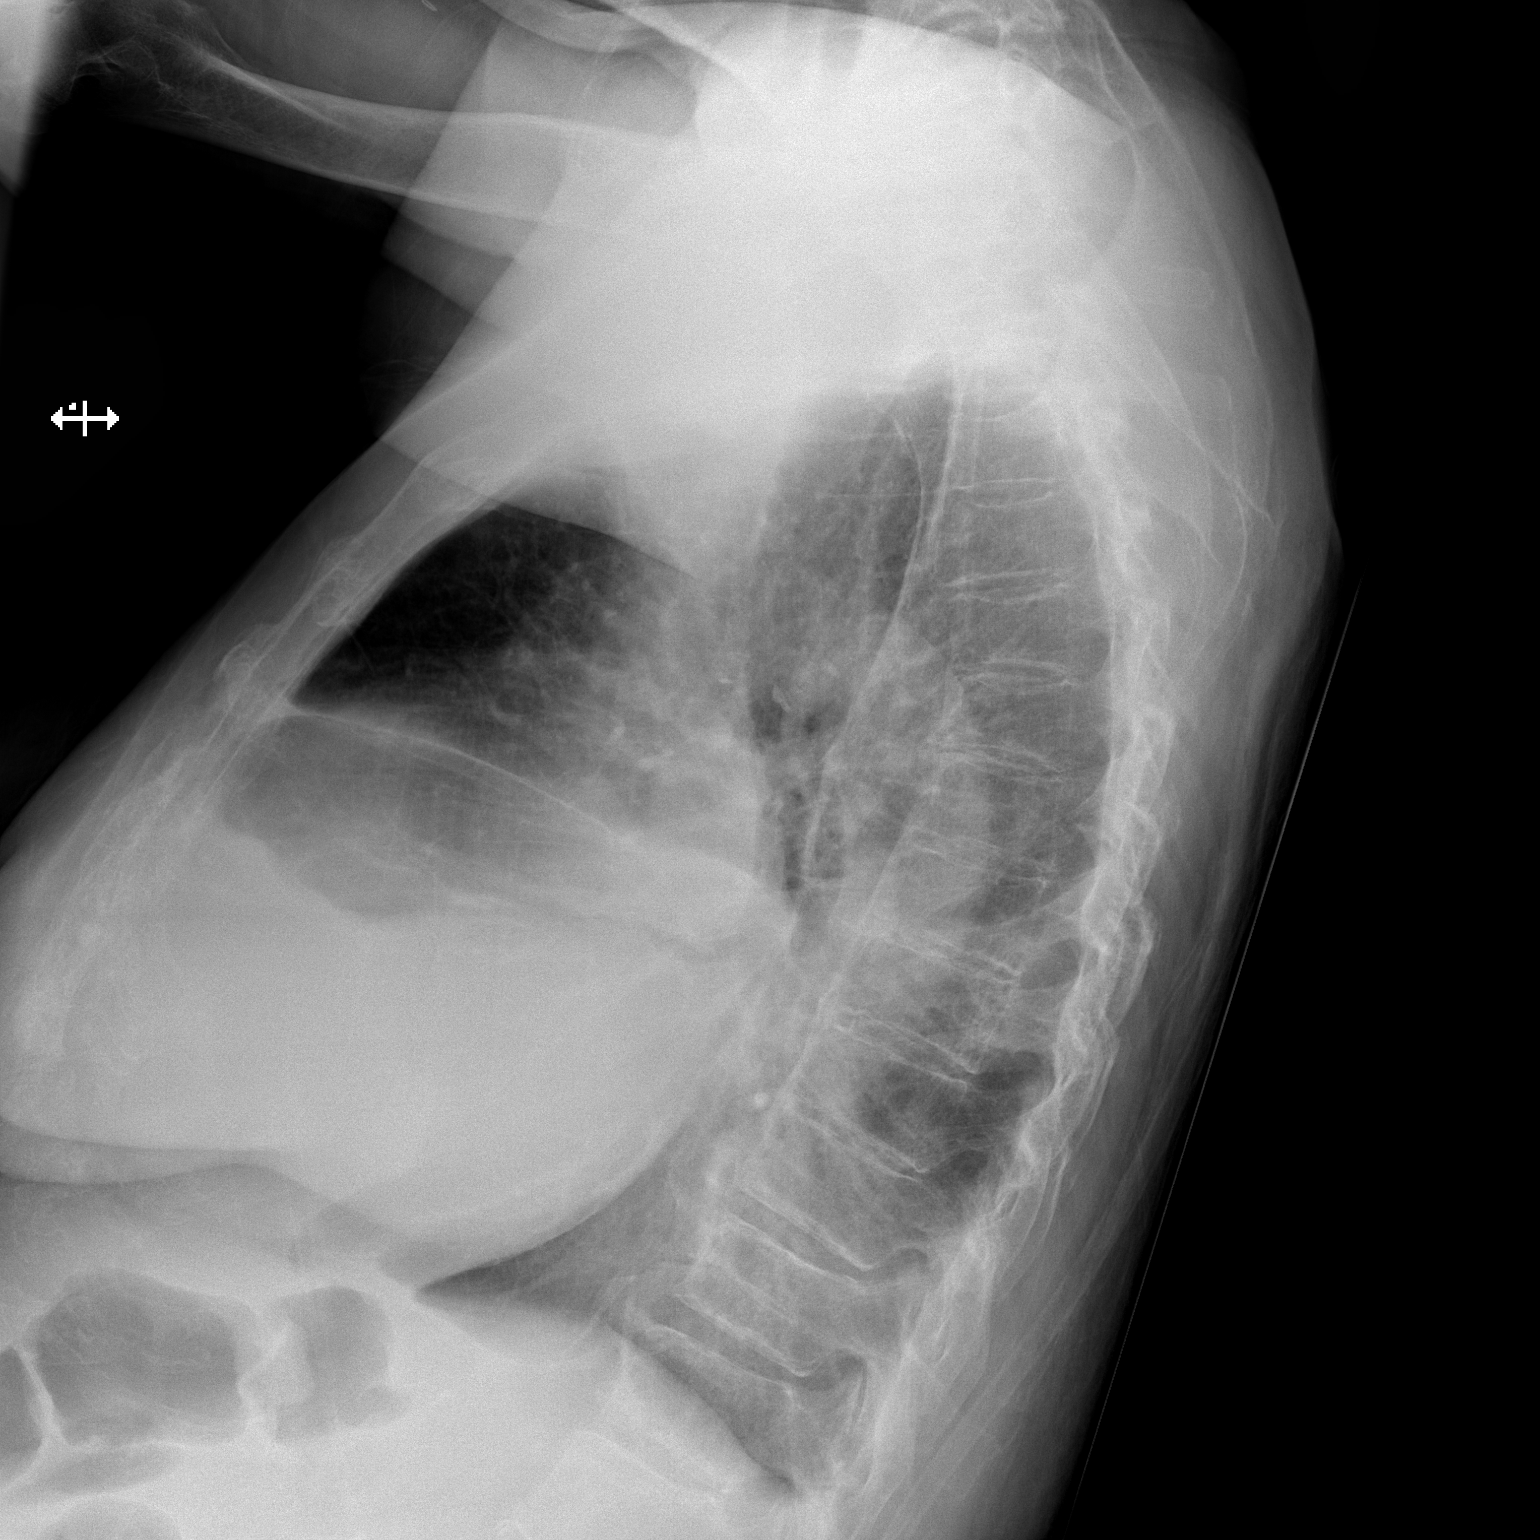

[2 of 2 positions shown; findings below may reference images not displayed]

FINDINGS: Cardiac enlargement. No vascular congestion. Moderate right pleural
effusion. There is right middle lobe consolidation, likely
pneumonia. This is progressing since the previous study. Left lung
is clear. No pneumothorax. Calcified and tortuous aorta.
Degenerative changes in the spine.
IMPRESSION: Right middle lobe consolidation with moderate right pleural
effusion.

## 2021-12-22 IMAGING — US IR THORACENTESIS ASP PLEURAL SPACE W/IMG GUIDE
1 series · 2 of 2 positions shown · non-contrast
Comparison: none

INDICATION: End-stage renal disease on hemodialysis. Recurrent right pleural
effusion. Request for therapeutic thoracentesis.

[Series 1: ir (id) (id)/(id)/(id) ir · 2 of 2 slices shown]
[im 1/2]
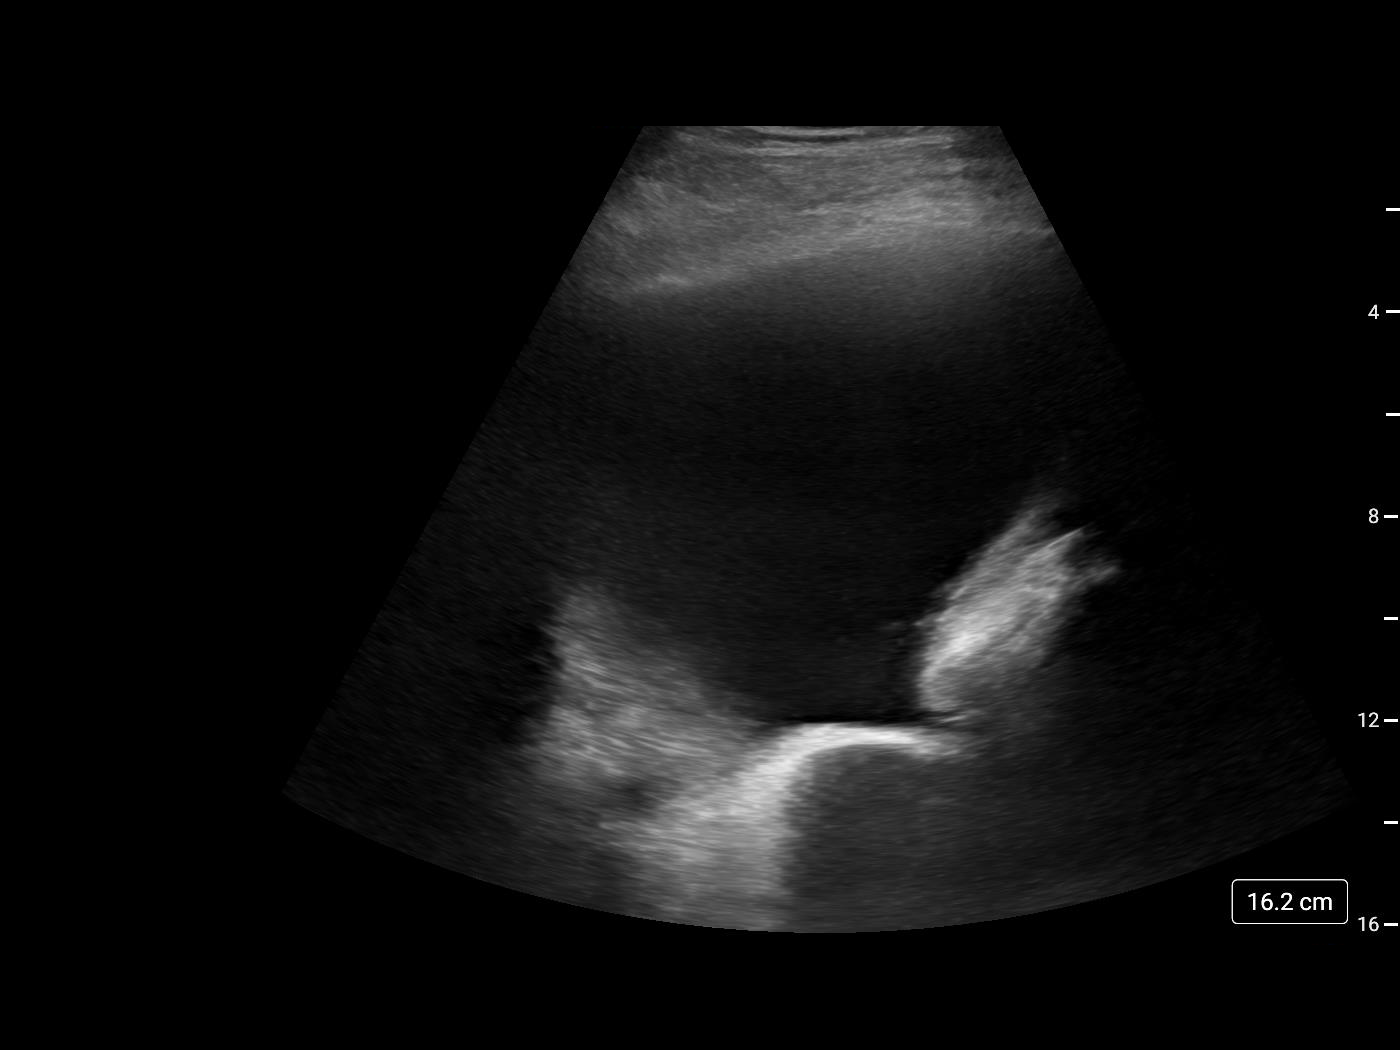
[im 2/2]
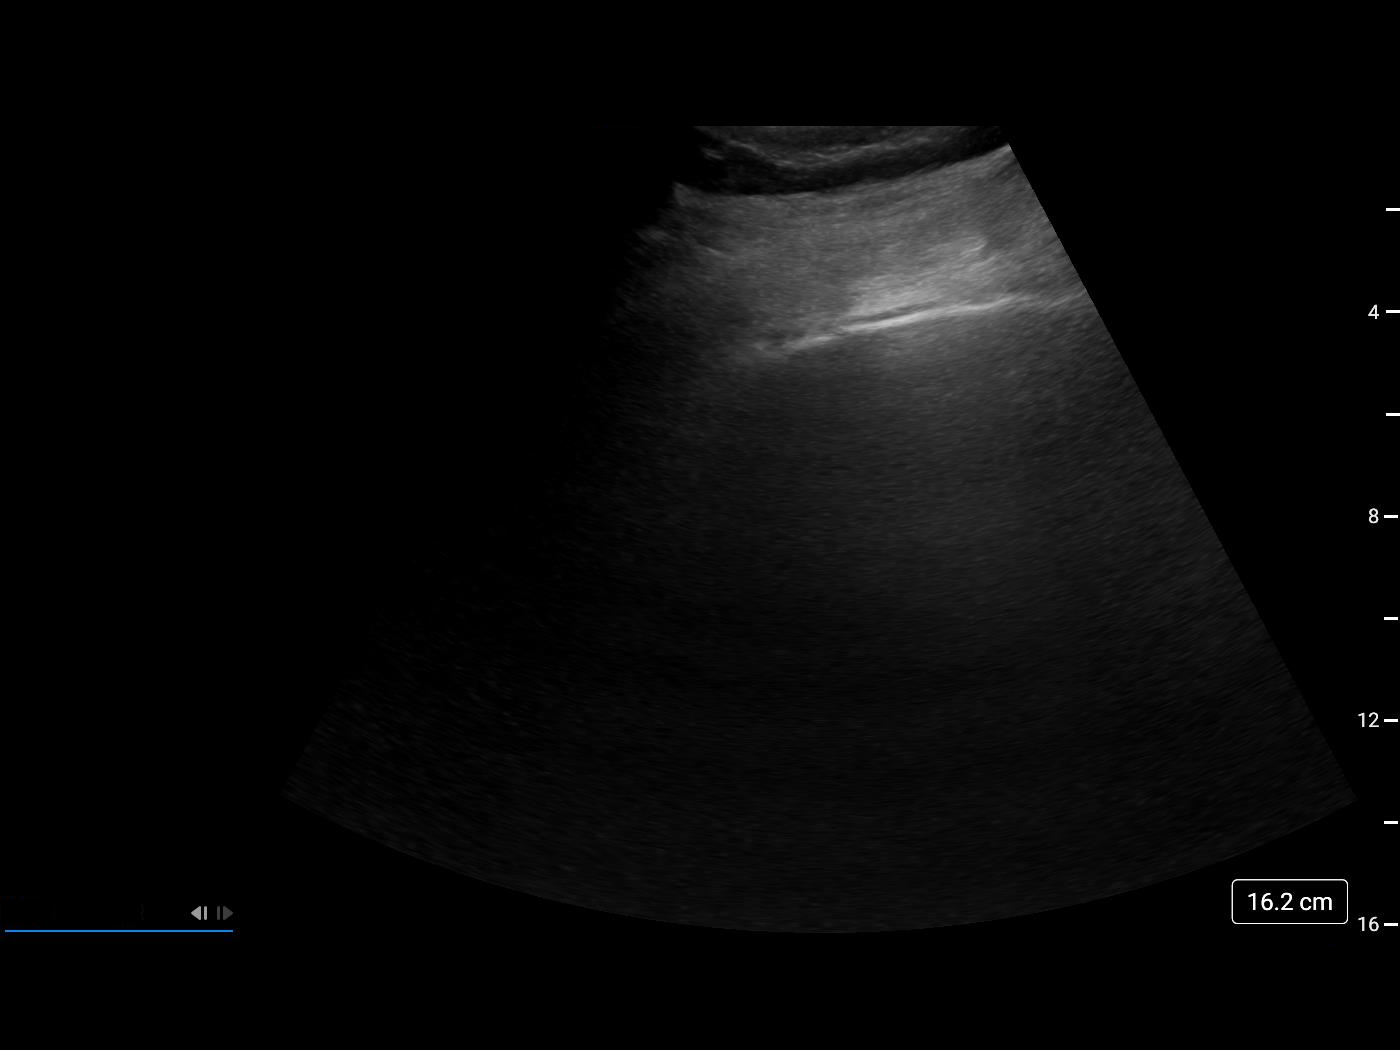

[2 of 2 positions shown; findings below may reference images not displayed]

EXAM:
ULTRASOUND GUIDED RIGHT THORACENTESIS

MEDICATIONS:
1% lidocaine 15 mL

COMPLICATIONS:
None immediate.

PROCEDURE:
An ultrasound guided thoracentesis was thoroughly discussed with the
patient and questions answered. The benefits, risks, alternatives
and complications were also discussed. The patient understands and
wishes to proceed with the procedure. Written consent was obtained.

Ultrasound was performed to localize and mark an adequate pocket of
fluid in the right chest. The area was then prepped and draped in
the normal sterile fashion. 1% Lidocaine was used for local
anesthesia. Under ultrasound guidance a 6 Fr Safe-T-Centesis
catheter was introduced. Thoracentesis was performed. The catheter
was removed and a dressing applied.
FINDINGS: A total of approximately 1.2 L of clear amber fluid was removed.
IMPRESSION: Successful ultrasound guided right thoracentesis yielding 1.2 L of
pleural fluid.

No pneumothorax on post-procedure chest x-ray.

## 2021-12-22 IMAGING — MR MR LUMBAR SPINE W/O CM
4 of 5 series · 18 of 48 positions shown · non-contrast
Comparison: No prior MRI.

CLINICAL DATA: Low back pain

EXAM:
MRI LUMBAR SPINE WITHOUT CONTRAST
TECHNIQUE: Multiplanar, multisequence MR imaging of the lumbar spine was
performed. No intravenous contrast was administered.

[Series 4: T2 · sagittal · 4.0mm · 0.55mm/px · 5 of 15 slices shown (1 of 2)]
[im 1/15]
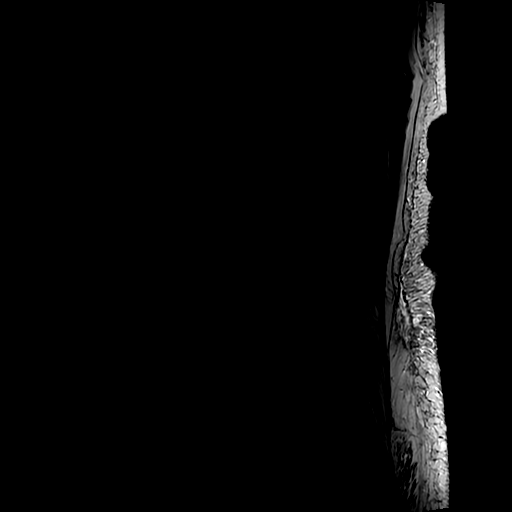
[im 4/15]
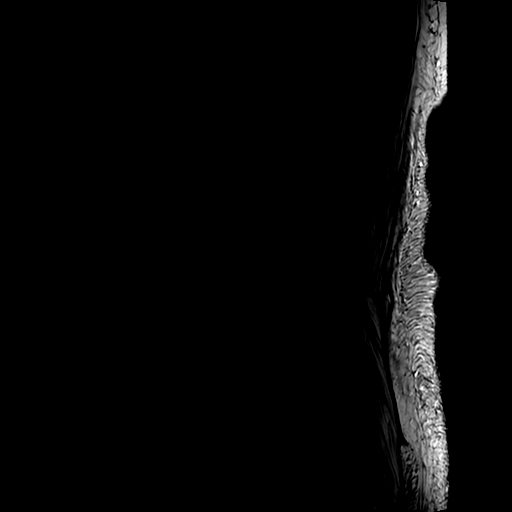
[im 8/15]
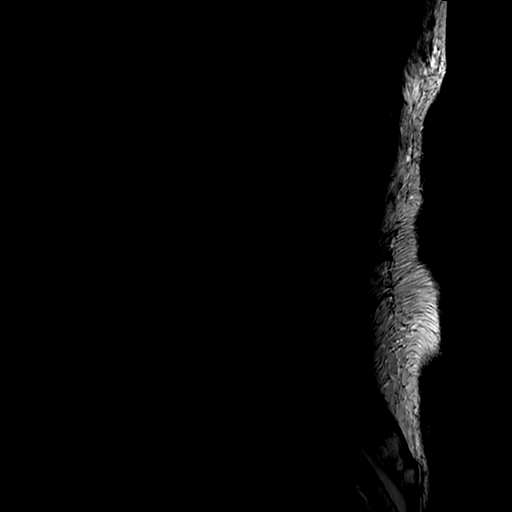
[im 11/15]
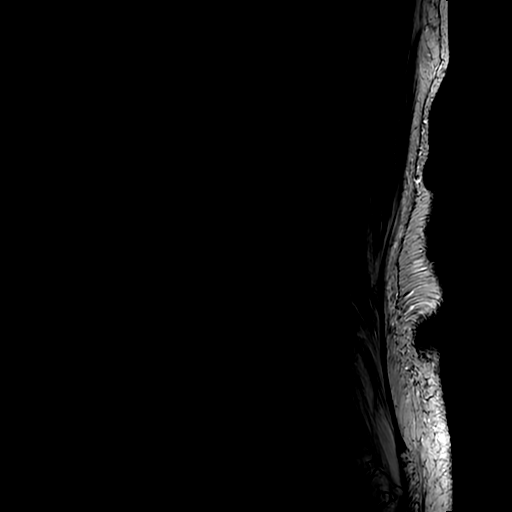
[im 15/15]
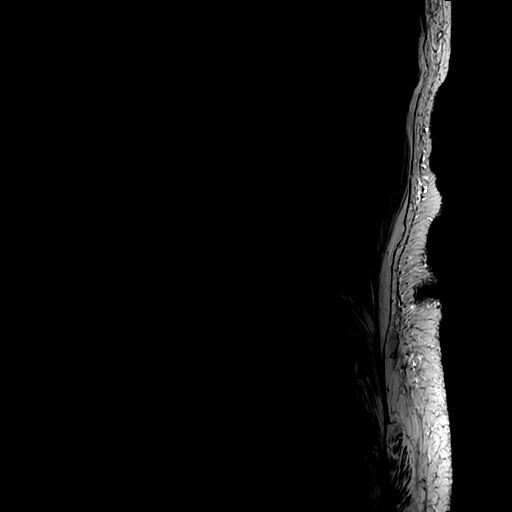

[Series 6: T1 · sagittal · 4.0mm · 0.55mm/px · 3 of 15 slices shown (1 of 2)]
[im 3/15]
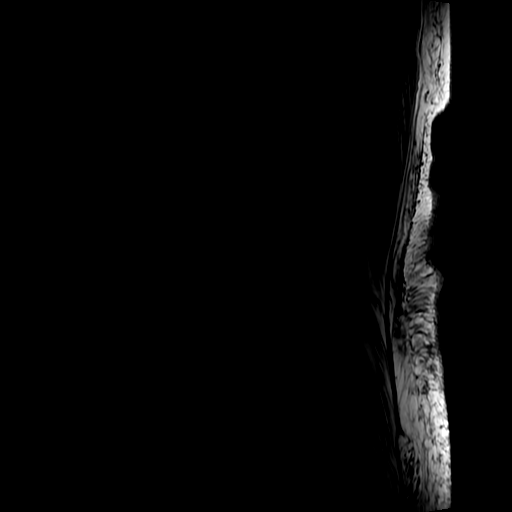
[im 9/15]
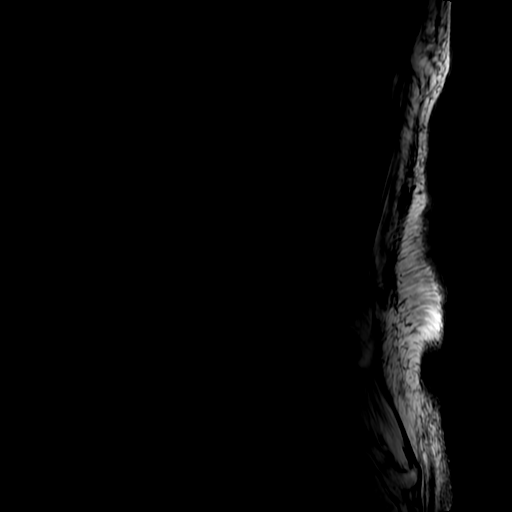
[im 15/15]
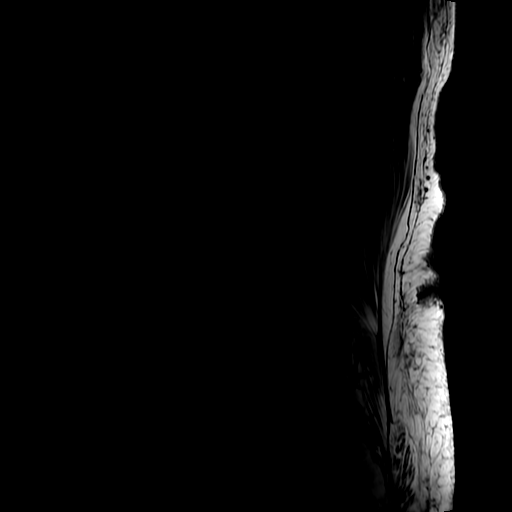

[Series 7: T2 · axial · 4.0mm · 0.39mm/px · z∈[+13,+202]mm · 7 of 42 slices shown (2 of 2)]
[im 3/42]
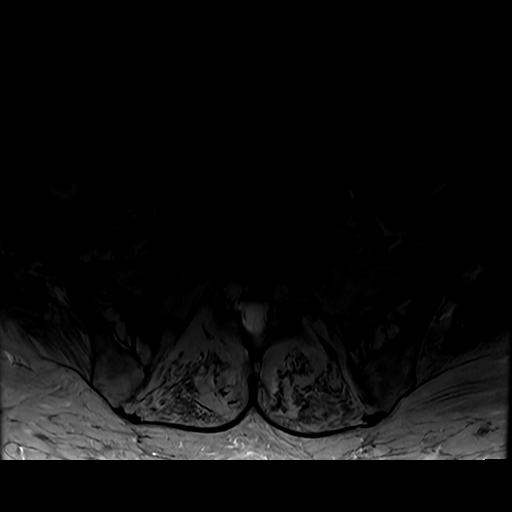
[im 6/42]
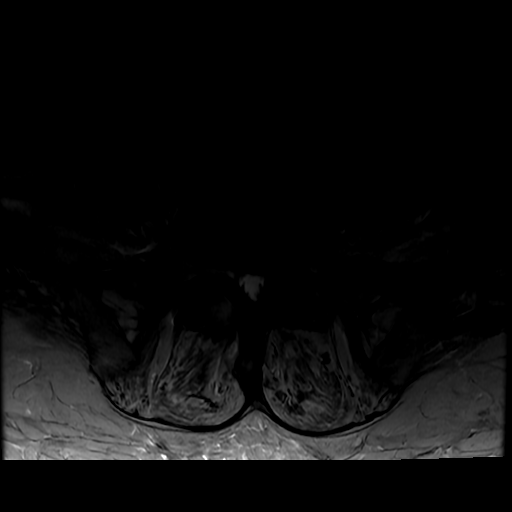
[im 9/42]
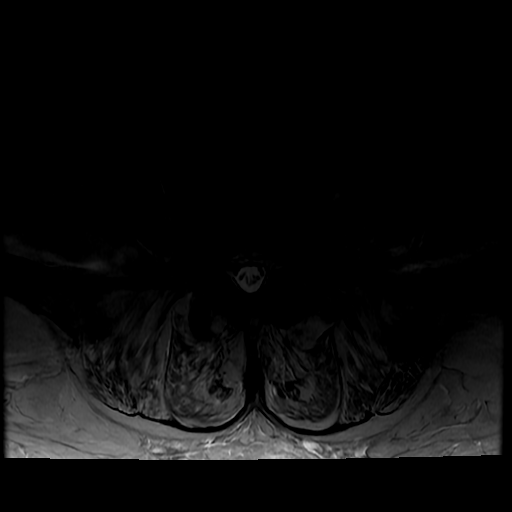
[im 14/42]
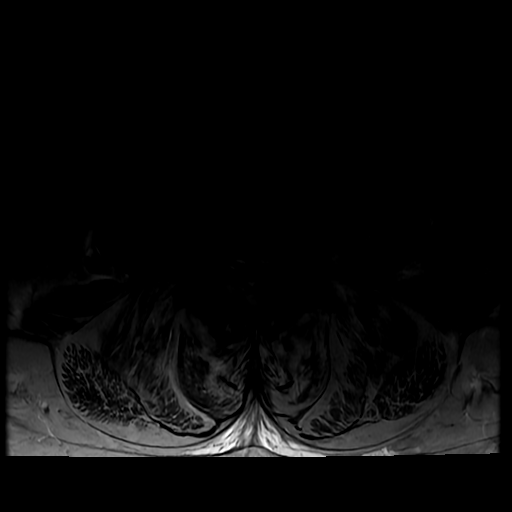
[im 20/42]
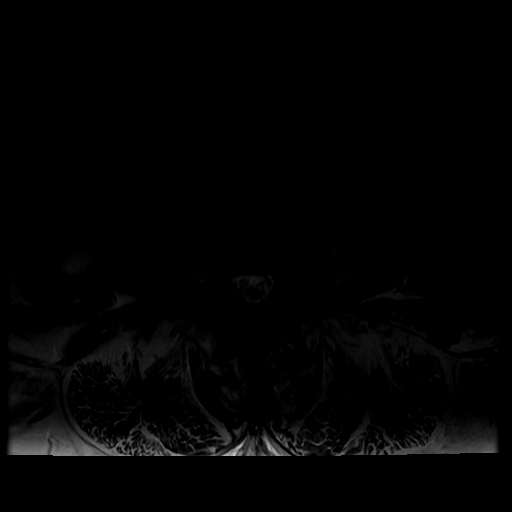
[im 22/42]
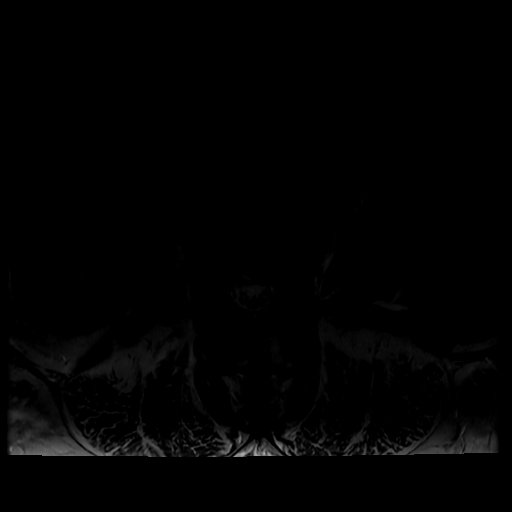
[im 36/42]
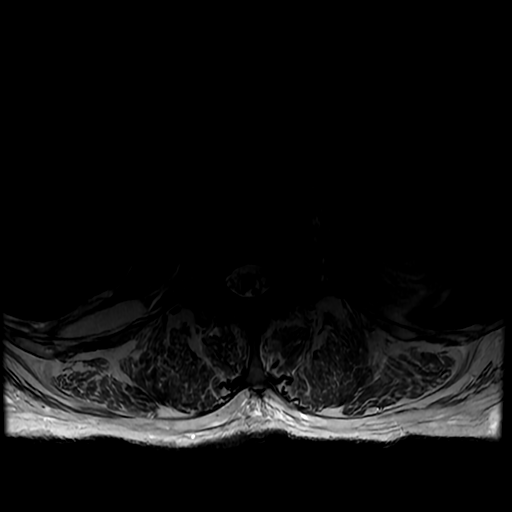

[Series 8: T1 · axial · 4.0mm · 0.39mm/px · z∈[+28,+202]mm · 3 of 42 slices shown (2 of 2)]
[im 6/42]
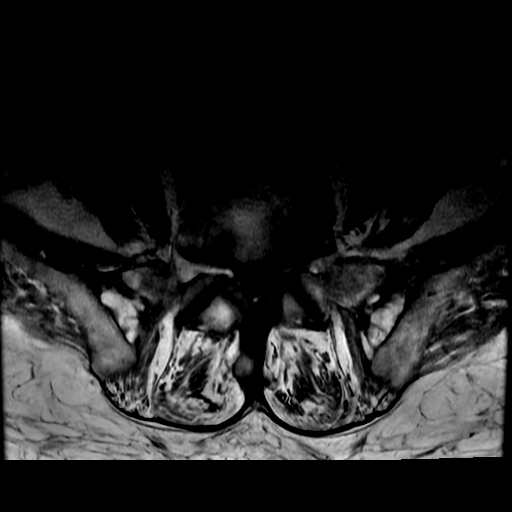
[im 22/42]
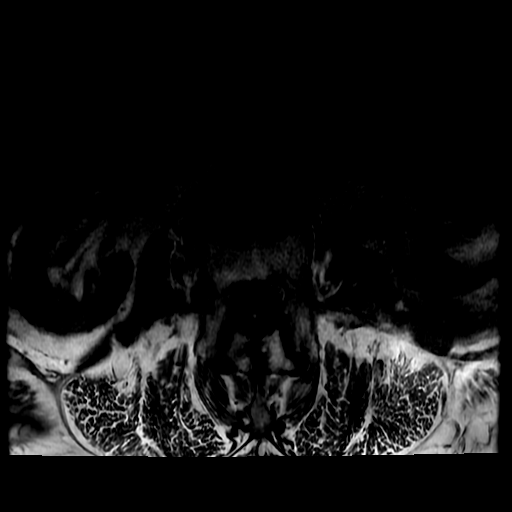
[im 36/42]
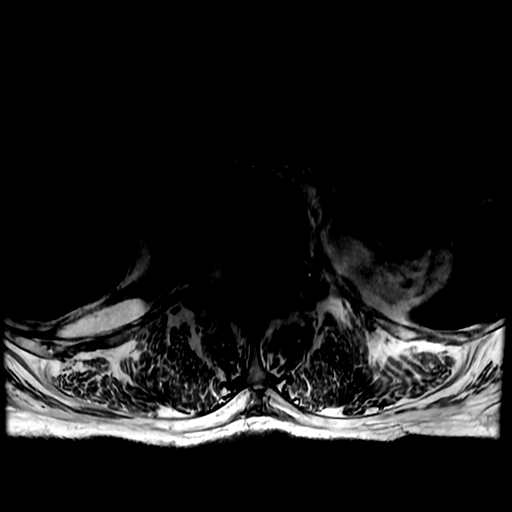

[18 of 48 positions shown; findings below may reference images not displayed]

FINDINGS: Evaluation is limited by artifact from the degree of edema in the
subcutaneous tissues.

Segmentation: Standard.

Alignment:  Physiologic.

Vertebrae: Diffusely decreased T1 and heterogeneously increased T2
signal within the L4 vertebral body and pedicles. Vertebral body
height loss, mild bowing of the posterior cortex inferiorly.
Additional smaller T1 hypointense, T2 hyperintense lesions in L2
(series 6, images 7 and 9), L3 (series 6, image 9), L5 (series 6,
images 7 and 10), and S1 (series 6, images 6 and 10). Multilevel
endplate degenerative changes.

Conus medullaris and cauda equina: Conus extends to the L1 level.
Conus and cauda equina appear normal. No epidural extension of
tumor.

Paraspinal and other soft tissues: Multiple T1 and T2 hyperintense
lesions in the liver and kidneys. Edema in the subcutaneous tissues.

Disc levels:

T12-L1: No significant disc bulge. Mild facet arthropathy. No spinal
canal stenosis or neural foraminal narrowing.

L1-L2: Mild disc bulge. Moderate facet arthropathy. No spinal canal
stenosis. No neural foraminal narrowing.

L2-L3: Disc height loss with broad-based disc bulge. Moderate severe
facet arthropathy and ligamentum flavum hypertrophy. Mild spinal
canal stenosis. Moderate right neural foraminal narrowing.

L3-L4: Broad-based disc bulge, somewhat eccentric to the right.
Moderate facet arthropathy with ligamentum flavum hypertrophy. No
spinal canal stenosis. Mild left and severe right neural foraminal
narrowing.

L4-L5: Mild narrowing of the spinal canal secondary to L4 pathologic
compression fracture. Broad-based disc bulge. Severe facet
arthropathy. Ligamentum flavum hypertrophy. Severe spinal canal
stenosis. Severe bilateral neural foraminal narrowing.

L5-S1: Broad-based disc bulge. Severe facet arthropathy. Ligamentum
flavum hypertrophy. No spinal canal stenosis. Moderate bilateral
neural foraminal narrowing.
IMPRESSION: 1. Evaluation is somewhat limited by artifact caused by the degree
of edema in the subcutaneous tissues. Within this limitation,
diffusely abnormal marrow signal within the L4 vertebral body with
vertebral body height loss, consistent with pathologic compression
fracture. In addition to pre-existing degenerative changes, the
compression fracture adds mild additional narrowing of the spinal
canal.
2. Multiple smaller foci of abnormal signal in L2, L3, L5, and S1,
also concerning for metastatic disease.
3. L4-L5 severe spinal canal stenosis and bilateral neural foraminal
narrowing, secondary to a combination of pathologic compression
fracture and severe degenerative changes.
4. L2-L3 mild spinal canal stenosis and moderate right neural
foraminal narrowing.
5. L3-L4 severe right neural foraminal narrowing and L5-S1 moderate
bilateral neural foraminal narrowing.

## 2021-12-22 IMAGING — DX DG CHEST 1V PORT
1 series · 1 of 1 positions shown · non-contrast
Comparison: Chest radiograph 07/17/2021

CLINICAL DATA: Status post right thoracentesis

EXAM:
PORTABLE CHEST 1 VIEW

[chest ap]
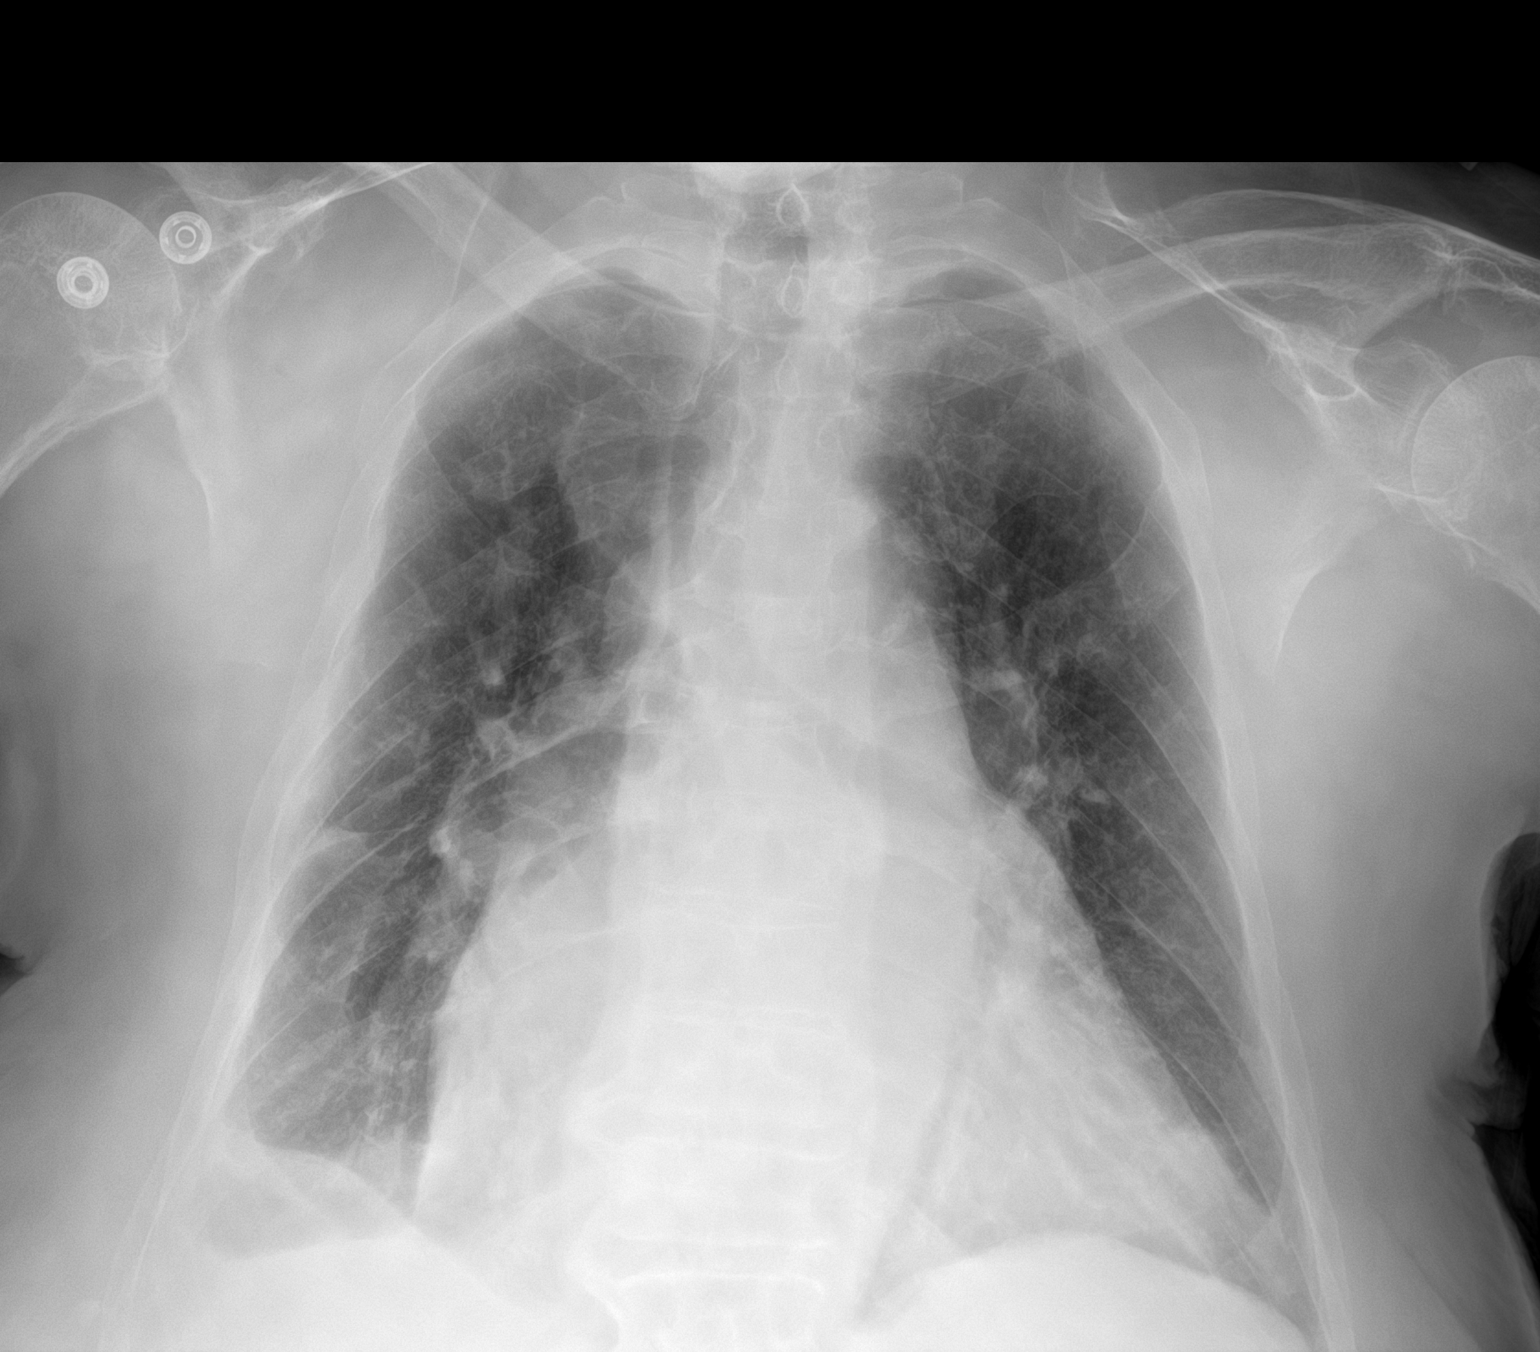

[1 of 1 positions shown; findings below may reference images not displayed]

FINDINGS: The heart is enlarged, unchanged. The mediastinal contours are
stable.

The right pleural effusion has decreased in size following
thoracentesis, with improved aeration of the right base. There is no
significant left effusion. There is no new focal airspace disease.
There is no appreciable pneumothorax.
IMPRESSION: Decreased right pleural effusion with improved aeration of the right
base following thoracentesis. No appreciable pneumothorax.
